# Patient Record
Sex: Male | Born: 1945
Health system: Southern US, Community
[De-identification: ages and names within clinical notes are randomized; demographics above are authoritative.]

## PROBLEM LIST (undated history)

## (undated) DIAGNOSIS — I10 Essential (primary) hypertension: Secondary | ICD-10-CM

## (undated) DIAGNOSIS — N183 Chronic kidney disease, stage 3 unspecified: Secondary | ICD-10-CM

## (undated) DIAGNOSIS — R7303 Prediabetes: Secondary | ICD-10-CM

## (undated) DIAGNOSIS — R519 Headache, unspecified: Secondary | ICD-10-CM

## (undated) DIAGNOSIS — G8929 Other chronic pain: Secondary | ICD-10-CM

## (undated) DIAGNOSIS — I714 Abdominal aortic aneurysm, without rupture: Secondary | ICD-10-CM

## (undated) DIAGNOSIS — M549 Dorsalgia, unspecified: Secondary | ICD-10-CM

## (undated) DIAGNOSIS — K219 Gastro-esophageal reflux disease without esophagitis: Secondary | ICD-10-CM

## (undated) DIAGNOSIS — M1990 Unspecified osteoarthritis, unspecified site: Secondary | ICD-10-CM

## (undated) DIAGNOSIS — K220 Achalasia of cardia: Secondary | ICD-10-CM

## (undated) DIAGNOSIS — R51 Headache: Secondary | ICD-10-CM

## (undated) DIAGNOSIS — N320 Bladder-neck obstruction: Secondary | ICD-10-CM

## (undated) DIAGNOSIS — E785 Hyperlipidemia, unspecified: Secondary | ICD-10-CM

## (undated) DIAGNOSIS — E119 Type 2 diabetes mellitus without complications: Secondary | ICD-10-CM

## (undated) DIAGNOSIS — Z8673 Personal history of transient ischemic attack (TIA), and cerebral infarction without residual deficits: Secondary | ICD-10-CM

## (undated) DIAGNOSIS — R1312 Dysphagia, oropharyngeal phase: Secondary | ICD-10-CM

## (undated) DIAGNOSIS — F32A Depression, unspecified: Secondary | ICD-10-CM

## (undated) DIAGNOSIS — I2510 Atherosclerotic heart disease of native coronary artery without angina pectoris: Secondary | ICD-10-CM

## (undated) DIAGNOSIS — K8500 Idiopathic acute pancreatitis without necrosis or infection: Secondary | ICD-10-CM

## (undated) DIAGNOSIS — I4892 Unspecified atrial flutter: Secondary | ICD-10-CM

## (undated) DIAGNOSIS — R413 Other amnesia: Secondary | ICD-10-CM

## (undated) DIAGNOSIS — F0150 Vascular dementia without behavioral disturbance: Secondary | ICD-10-CM

## (undated) DIAGNOSIS — I779 Disorder of arteries and arterioles, unspecified: Secondary | ICD-10-CM

## (undated) DIAGNOSIS — K5900 Constipation, unspecified: Secondary | ICD-10-CM

## (undated) DIAGNOSIS — R0600 Dyspnea, unspecified: Secondary | ICD-10-CM

## (undated) DIAGNOSIS — N529 Male erectile dysfunction, unspecified: Secondary | ICD-10-CM

## (undated) DIAGNOSIS — I4891 Unspecified atrial fibrillation: Secondary | ICD-10-CM

## (undated) DIAGNOSIS — N35919 Unspecified urethral stricture, male, unspecified site: Secondary | ICD-10-CM

## (undated) DIAGNOSIS — I739 Peripheral vascular disease, unspecified: Secondary | ICD-10-CM

## (undated) DIAGNOSIS — F419 Anxiety disorder, unspecified: Secondary | ICD-10-CM

## (undated) HISTORY — DX: Disorder of arteries and arterioles, unspecified: I77.9

## (undated) HISTORY — DX: Male erectile dysfunction, unspecified: N52.9

## (undated) HISTORY — DX: Achalasia of cardia: K22.0

## (undated) HISTORY — DX: Atherosclerotic heart disease of native coronary artery without angina pectoris: I25.10

## (undated) HISTORY — DX: Peripheral vascular disease, unspecified: I73.9

## (undated) HISTORY — DX: Dorsalgia, unspecified: M54.9

## (undated) HISTORY — DX: Dysphagia, oropharyngeal phase: R13.12

## (undated) HISTORY — DX: Constipation, unspecified: K59.00

## (undated) HISTORY — PX: CATARACT EXTRACTION W/ INTRAOCULAR LENS  IMPLANT, BILATERAL: SHX1307

## (undated) HISTORY — DX: Vascular dementia, unspecified severity, without behavioral disturbance, psychotic disturbance, mood disturbance, and anxiety: F01.50

## (undated) HISTORY — DX: Other chronic pain: G89.29

## (undated) HISTORY — DX: Essential (primary) hypertension: I10

## (undated) HISTORY — PX: OTHER SURGICAL HISTORY: SHX169

## (undated) HISTORY — DX: Hyperlipidemia, unspecified: E78.5

---
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
## 1998-08-14 ENCOUNTER — Other Ambulatory Visit: Admission: RE | Admit: 1998-08-14 | Discharge: 1998-08-14 | Payer: Self-pay | Admitting: *Deleted

## 2000-03-25 ENCOUNTER — Emergency Department (HOSPITAL_COMMUNITY): Admission: EM | Admit: 2000-03-25 | Discharge: 2000-03-25 | Payer: Self-pay | Admitting: Emergency Medicine

## 2001-12-12 ENCOUNTER — Encounter: Payer: Self-pay | Admitting: Emergency Medicine

## 2001-12-12 ENCOUNTER — Inpatient Hospital Stay (HOSPITAL_COMMUNITY): Admission: EM | Admit: 2001-12-12 | Discharge: 2001-12-13 | Payer: Self-pay | Admitting: Emergency Medicine

## 2002-02-03 ENCOUNTER — Encounter: Admission: RE | Admit: 2002-02-03 | Discharge: 2002-02-03 | Payer: Self-pay | Admitting: Family Medicine

## 2002-06-08 ENCOUNTER — Encounter: Admission: RE | Admit: 2002-06-08 | Discharge: 2002-06-08 | Payer: Self-pay | Admitting: Family Medicine

## 2002-07-06 ENCOUNTER — Ambulatory Visit (HOSPITAL_COMMUNITY): Admission: RE | Admit: 2002-07-06 | Discharge: 2002-07-06 | Payer: Self-pay | Admitting: Sports Medicine

## 2002-07-16 ENCOUNTER — Encounter: Payer: Self-pay | Admitting: Cardiology

## 2002-07-16 ENCOUNTER — Ambulatory Visit (HOSPITAL_COMMUNITY): Admission: RE | Admit: 2002-07-16 | Discharge: 2002-07-16 | Payer: Self-pay | Admitting: Sports Medicine

## 2002-08-30 ENCOUNTER — Encounter: Admission: RE | Admit: 2002-08-30 | Discharge: 2002-08-30 | Payer: Self-pay | Admitting: Family Medicine

## 2002-10-22 ENCOUNTER — Encounter: Admission: RE | Admit: 2002-10-22 | Discharge: 2002-10-22 | Payer: Self-pay | Admitting: Family Medicine

## 2002-12-07 ENCOUNTER — Ambulatory Visit (HOSPITAL_COMMUNITY): Admission: RE | Admit: 2002-12-07 | Discharge: 2002-12-07 | Payer: Self-pay | Admitting: Gastroenterology

## 2003-01-12 ENCOUNTER — Encounter: Admission: RE | Admit: 2003-01-12 | Discharge: 2003-01-12 | Payer: Self-pay | Admitting: Family Medicine

## 2003-06-13 ENCOUNTER — Encounter: Admission: RE | Admit: 2003-06-13 | Discharge: 2003-06-13 | Payer: Self-pay | Admitting: Family Medicine

## 2003-06-27 ENCOUNTER — Encounter: Admission: RE | Admit: 2003-06-27 | Discharge: 2003-06-27 | Payer: Self-pay | Admitting: Family Medicine

## 2003-08-03 ENCOUNTER — Encounter: Admission: RE | Admit: 2003-08-03 | Discharge: 2003-08-03 | Payer: Self-pay | Admitting: Sports Medicine

## 2004-10-31 ENCOUNTER — Ambulatory Visit (HOSPITAL_COMMUNITY): Admission: RE | Admit: 2004-10-31 | Discharge: 2004-10-31 | Payer: Self-pay | Admitting: Family Medicine

## 2004-10-31 ENCOUNTER — Inpatient Hospital Stay (HOSPITAL_COMMUNITY): Admission: AD | Admit: 2004-10-31 | Discharge: 2004-11-01 | Payer: Self-pay | Admitting: Family Medicine

## 2004-10-31 ENCOUNTER — Ambulatory Visit: Payer: Self-pay | Admitting: Family Medicine

## 2004-11-07 ENCOUNTER — Ambulatory Visit: Payer: Self-pay | Admitting: Family Medicine

## 2004-12-04 ENCOUNTER — Ambulatory Visit: Payer: Self-pay | Admitting: Family Medicine

## 2004-12-18 ENCOUNTER — Ambulatory Visit: Payer: Self-pay | Admitting: Family Medicine

## 2005-01-01 ENCOUNTER — Ambulatory Visit (HOSPITAL_COMMUNITY): Admission: RE | Admit: 2005-01-01 | Discharge: 2005-01-01 | Payer: Self-pay | Admitting: *Deleted

## 2005-01-01 ENCOUNTER — Ambulatory Visit: Payer: Self-pay | Admitting: Sports Medicine

## 2005-01-02 ENCOUNTER — Ambulatory Visit: Payer: Self-pay | Admitting: Family Medicine

## 2005-01-31 ENCOUNTER — Ambulatory Visit: Payer: Self-pay | Admitting: Family Medicine

## 2005-02-04 ENCOUNTER — Ambulatory Visit: Payer: Self-pay | Admitting: Family Medicine

## 2005-05-30 ENCOUNTER — Ambulatory Visit: Payer: Self-pay | Admitting: Family Medicine

## 2005-08-08 ENCOUNTER — Ambulatory Visit: Payer: Self-pay | Admitting: Family Medicine

## 2005-08-15 ENCOUNTER — Ambulatory Visit: Payer: Self-pay | Admitting: Family Medicine

## 2006-03-25 ENCOUNTER — Ambulatory Visit: Payer: Self-pay | Admitting: Family Medicine

## 2006-03-26 ENCOUNTER — Ambulatory Visit: Payer: Self-pay | Admitting: Family Medicine

## 2006-03-26 ENCOUNTER — Encounter (INDEPENDENT_AMBULATORY_CARE_PROVIDER_SITE_OTHER): Payer: Self-pay | Admitting: Family Medicine

## 2006-03-26 LAB — CONVERTED CEMR LAB
ALT: 47 units/L (ref 0–53)
AST: 39 units/L — ABNORMAL HIGH (ref 0–37)
Albumin: 4.1 g/dL (ref 3.5–5.2)
Alkaline Phosphatase: 81 units/L (ref 39–117)
BUN: 17 mg/dL (ref 6–23)
CO2: 21 meq/L (ref 19–32)
Calcium: 8.8 mg/dL (ref 8.4–10.5)
Chloride: 105 meq/L (ref 96–112)
Creatinine, Ser: 1.05 mg/dL (ref 0.40–1.50)
Direct LDL: 163 mg/dL — ABNORMAL HIGH
Glucose, Bld: 92 mg/dL (ref 70–99)
Potassium: 3.8 meq/L (ref 3.5–5.3)
Sodium: 138 meq/L (ref 135–145)
Total Bilirubin: 0.5 mg/dL (ref 0.3–1.2)
Total Protein: 6.8 g/dL (ref 6.0–8.3)

## 2006-03-27 DIAGNOSIS — I10 Essential (primary) hypertension: Secondary | ICD-10-CM

## 2006-03-27 DIAGNOSIS — N529 Male erectile dysfunction, unspecified: Secondary | ICD-10-CM

## 2006-03-27 DIAGNOSIS — I2510 Atherosclerotic heart disease of native coronary artery without angina pectoris: Secondary | ICD-10-CM

## 2006-05-07 ENCOUNTER — Ambulatory Visit: Payer: Self-pay | Admitting: Sports Medicine

## 2006-06-04 ENCOUNTER — Ambulatory Visit: Payer: Self-pay | Admitting: Family Medicine

## 2006-06-04 DIAGNOSIS — E663 Overweight: Secondary | ICD-10-CM | POA: Insufficient documentation

## 2006-06-04 LAB — CONVERTED CEMR LAB
Cholesterol, target level: 200 mg/dL
HDL goal, serum: 40 mg/dL
LDL Goal: 100 mg/dL

## 2007-02-05 ENCOUNTER — Ambulatory Visit: Payer: Self-pay | Admitting: Family Medicine

## 2007-02-05 DIAGNOSIS — E785 Hyperlipidemia, unspecified: Secondary | ICD-10-CM | POA: Insufficient documentation

## 2007-02-09 ENCOUNTER — Encounter: Payer: Self-pay | Admitting: Family Medicine

## 2007-02-09 ENCOUNTER — Ambulatory Visit: Payer: Self-pay | Admitting: Family Medicine

## 2007-02-09 LAB — CONVERTED CEMR LAB: Hemoglobin: 12.9 g/dL

## 2007-02-11 LAB — CONVERTED CEMR LAB
ALT: 22 units/L (ref 0–53)
AST: 25 units/L (ref 0–37)
Albumin: 4.1 g/dL (ref 3.5–5.2)
Alkaline Phosphatase: 86 units/L (ref 39–117)
BUN: 14 mg/dL (ref 6–23)
CO2: 24 meq/L (ref 19–32)
Calcium: 8.9 mg/dL (ref 8.4–10.5)
Chloride: 105 meq/L (ref 96–112)
Cholesterol: 230 mg/dL — ABNORMAL HIGH (ref 0–200)
Creatinine, Ser: 1.25 mg/dL (ref 0.40–1.50)
Glucose, Bld: 118 mg/dL — ABNORMAL HIGH (ref 70–99)
HDL: 53 mg/dL (ref >39)
LDL Cholesterol: 163 mg/dL — ABNORMAL HIGH (ref 0–99)
Potassium: 4.1 meq/L (ref 3.5–5.3)
Sodium: 139 meq/L (ref 135–145)
Total Bilirubin: 0.5 mg/dL (ref 0.3–1.2)
Total CHOL/HDL Ratio: 4.3
Total CK: 723 units/L — ABNORMAL HIGH (ref 7–232)
Total Protein: 6.9 g/dL (ref 6.0–8.3)
Triglycerides: 72 mg/dL (ref <150)
VLDL: 14 mg/dL (ref 0–40)

## 2007-06-18 ENCOUNTER — Ambulatory Visit: Payer: Self-pay | Admitting: Sports Medicine

## 2007-06-18 ENCOUNTER — Telehealth: Payer: Self-pay | Admitting: *Deleted

## 2007-06-18 LAB — CONVERTED CEMR LAB
Bilirubin Urine: NEGATIVE
Blood in Urine, dipstick: NEGATIVE
Glucose, Urine, Semiquant: NEGATIVE
Ketones, urine, test strip: NEGATIVE
Nitrite: NEGATIVE
Protein, U semiquant: 100
Specific Gravity, Urine: >=1.03
Urobilinogen, UA: 4
WBC Urine, dipstick: NEGATIVE
pH: 6

## 2007-06-19 ENCOUNTER — Ambulatory Visit: Payer: Self-pay | Admitting: Family Medicine

## 2007-06-19 ENCOUNTER — Encounter: Payer: Self-pay | Admitting: Family Medicine

## 2007-06-19 LAB — CONVERTED CEMR LAB
Bilirubin Urine: NEGATIVE
Blood in Urine, dipstick: NEGATIVE
Glucose, Urine, Semiquant: NEGATIVE
Ketones, urine, test strip: NEGATIVE
Nitrite: NEGATIVE
Protein, U semiquant: 30
Specific Gravity, Urine: >=1.03
Urobilinogen, UA: 1
WBC Urine, dipstick: NEGATIVE
pH: 5.5

## 2007-06-22 ENCOUNTER — Inpatient Hospital Stay (HOSPITAL_COMMUNITY): Admission: EM | Admit: 2007-06-22 | Discharge: 2007-06-24 | Payer: Self-pay | Admitting: Emergency Medicine

## 2007-06-22 ENCOUNTER — Ambulatory Visit: Payer: Self-pay | Admitting: Family Medicine

## 2007-06-23 ENCOUNTER — Encounter: Payer: Self-pay | Admitting: Family Medicine

## 2007-06-23 LAB — CONVERTED CEMR LAB
HDL: 34 mg/dL
HDL: 34 mg/dL
LDL Cholesterol: 184 mg/dL

## 2007-06-24 ENCOUNTER — Ambulatory Visit: Payer: Self-pay | Admitting: *Deleted

## 2007-06-24 ENCOUNTER — Encounter: Payer: Self-pay | Admitting: Family Medicine

## 2007-06-25 ENCOUNTER — Encounter: Payer: Self-pay | Admitting: *Deleted

## 2007-06-27 ENCOUNTER — Emergency Department (HOSPITAL_COMMUNITY): Admission: EM | Admit: 2007-06-27 | Discharge: 2007-06-27 | Payer: Self-pay | Admitting: Emergency Medicine

## 2007-07-01 ENCOUNTER — Encounter: Payer: Self-pay | Admitting: Family Medicine

## 2007-07-01 ENCOUNTER — Ambulatory Visit: Payer: Self-pay | Admitting: Family Medicine

## 2007-07-03 LAB — CONVERTED CEMR LAB
BUN: 37 mg/dL — ABNORMAL HIGH (ref 6–23)
CO2: 21 meq/L (ref 19–32)
Calcium: 9.7 mg/dL (ref 8.4–10.5)
Chloride: 100 meq/L (ref 96–112)
Creatinine, Ser: 2.17 mg/dL — ABNORMAL HIGH (ref 0.40–1.50)
Glucose, Bld: 93 mg/dL (ref 70–99)
Potassium: 4.1 meq/L (ref 3.5–5.3)
Sodium: 134 meq/L — ABNORMAL LOW (ref 135–145)

## 2007-07-07 ENCOUNTER — Ambulatory Visit: Payer: Self-pay | Admitting: Family Medicine

## 2007-07-07 ENCOUNTER — Encounter: Payer: Self-pay | Admitting: Family Medicine

## 2007-07-07 ENCOUNTER — Encounter: Payer: Self-pay | Admitting: *Deleted

## 2007-07-07 LAB — CONVERTED CEMR LAB

## 2007-07-10 LAB — CONVERTED CEMR LAB
BUN: 31 mg/dL — ABNORMAL HIGH (ref 6–23)
CO2: 19 meq/L (ref 19–32)
Calcium: 9.1 mg/dL (ref 8.4–10.5)
Chloride: 102 meq/L (ref 96–112)
Creatinine, Ser: 1.44 mg/dL (ref 0.40–1.50)
Glucose, Bld: 120 mg/dL — ABNORMAL HIGH (ref 70–99)
Potassium: 4.5 meq/L (ref 3.5–5.3)
Sodium: 134 meq/L — ABNORMAL LOW (ref 135–145)

## 2007-07-17 ENCOUNTER — Ambulatory Visit: Payer: Self-pay | Admitting: Family Medicine

## 2007-11-23 ENCOUNTER — Encounter (INDEPENDENT_AMBULATORY_CARE_PROVIDER_SITE_OTHER): Payer: Self-pay | Admitting: *Deleted

## 2007-11-24 ENCOUNTER — Telehealth: Payer: Self-pay | Admitting: *Deleted

## 2007-11-27 ENCOUNTER — Encounter: Payer: Self-pay | Admitting: Family Medicine

## 2007-11-27 ENCOUNTER — Ambulatory Visit: Payer: Self-pay | Admitting: Family Medicine

## 2007-11-27 DIAGNOSIS — M545 Low back pain: Secondary | ICD-10-CM

## 2007-11-28 LAB — CONVERTED CEMR LAB: Direct LDL: 147 mg/dL — ABNORMAL HIGH

## 2008-02-09 ENCOUNTER — Telehealth: Payer: Self-pay | Admitting: Family Medicine

## 2008-02-11 ENCOUNTER — Encounter: Payer: Self-pay | Admitting: Family Medicine

## 2008-02-12 ENCOUNTER — Ambulatory Visit: Payer: Self-pay | Admitting: Family Medicine

## 2008-02-12 ENCOUNTER — Encounter: Payer: Self-pay | Admitting: Family Medicine

## 2008-02-29 ENCOUNTER — Encounter: Payer: Self-pay | Admitting: Family Medicine

## 2008-02-29 LAB — CONVERTED CEMR LAB
ALT: 37 units/L (ref 0–53)
AST: 40 units/L — ABNORMAL HIGH (ref 0–37)
Albumin: 4.1 g/dL (ref 3.5–5.2)
Alkaline Phosphatase: 84 units/L (ref 39–117)
BUN: 19 mg/dL (ref 6–23)
CO2: 28 meq/L (ref 19–32)
Calcium: 8.8 mg/dL (ref 8.4–10.5)
Chloride: 102 meq/L (ref 96–112)
Cholesterol: 147 mg/dL (ref 0–200)
Creatinine, Ser: 1.24 mg/dL (ref 0.40–1.50)
Glucose, Bld: 120 mg/dL — ABNORMAL HIGH (ref 70–99)
HDL: 47 mg/dL (ref >39)
LDL Cholesterol: 87 mg/dL (ref 0–99)
Potassium: 3.8 meq/L (ref 3.5–5.3)
Sodium: 139 meq/L (ref 135–145)
Total Bilirubin: 0.5 mg/dL (ref 0.3–1.2)
Total CHOL/HDL Ratio: 3.1
Total Protein: 6.7 g/dL (ref 6.0–8.3)
Triglycerides: 66 mg/dL (ref <150)
VLDL: 13 mg/dL (ref 0–40)

## 2008-03-21 ENCOUNTER — Telehealth: Payer: Self-pay | Admitting: *Deleted

## 2008-03-22 ENCOUNTER — Encounter: Payer: Self-pay | Admitting: Family Medicine

## 2008-03-22 ENCOUNTER — Ambulatory Visit: Payer: Self-pay | Admitting: Family Medicine

## 2008-03-25 LAB — CONVERTED CEMR LAB: PSA: 0.63 ng/mL (ref 0.10–4.00)

## 2008-04-29 ENCOUNTER — Telehealth: Payer: Self-pay | Admitting: Family Medicine

## 2008-05-03 ENCOUNTER — Ambulatory Visit: Payer: Self-pay | Admitting: Family Medicine

## 2008-05-16 ENCOUNTER — Telehealth: Payer: Self-pay | Admitting: *Deleted

## 2008-06-08 ENCOUNTER — Encounter: Admission: RE | Admit: 2008-06-08 | Discharge: 2008-06-08 | Payer: Self-pay | Admitting: Family Medicine

## 2008-06-08 ENCOUNTER — Ambulatory Visit: Payer: Self-pay | Admitting: Family Medicine

## 2008-06-22 ENCOUNTER — Encounter: Admission: RE | Admit: 2008-06-22 | Discharge: 2008-07-21 | Payer: Self-pay | Admitting: Family Medicine

## 2009-01-18 ENCOUNTER — Encounter: Payer: Self-pay | Admitting: *Deleted

## 2009-02-07 ENCOUNTER — Encounter: Payer: Self-pay | Admitting: Family Medicine

## 2009-02-07 ENCOUNTER — Ambulatory Visit: Payer: Self-pay | Admitting: Family Medicine

## 2009-02-08 LAB — CONVERTED CEMR LAB
ALT: 28 units/L (ref 0–53)
AST: 34 units/L (ref 0–37)
Albumin: 4.2 g/dL (ref 3.5–5.2)
Alkaline Phosphatase: 88 units/L (ref 39–117)
BUN: 17 mg/dL (ref 6–23)
CO2: 25 meq/L (ref 19–32)
Calcium: 8.8 mg/dL (ref 8.4–10.5)
Chloride: 104 meq/L (ref 96–112)
Cholesterol: 201 mg/dL — ABNORMAL HIGH (ref 0–200)
Creatinine, Ser: 1.14 mg/dL (ref 0.40–1.50)
Glucose, Bld: 87 mg/dL (ref 70–99)
HDL: 43 mg/dL (ref >39)
LDL Cholesterol: 145 mg/dL — ABNORMAL HIGH (ref 0–99)
Potassium: 4.4 meq/L (ref 3.5–5.3)
Sodium: 138 meq/L (ref 135–145)
Total Bilirubin: 0.5 mg/dL (ref 0.3–1.2)
Total CHOL/HDL Ratio: 4.7
Total Protein: 6.5 g/dL (ref 6.0–8.3)
Triglycerides: 66 mg/dL (ref <150)
VLDL: 13 mg/dL (ref 0–40)

## 2009-02-13 ENCOUNTER — Telehealth: Payer: Self-pay | Admitting: Family Medicine

## 2009-02-28 ENCOUNTER — Ambulatory Visit: Payer: Self-pay | Admitting: Family Medicine

## 2009-04-13 ENCOUNTER — Telehealth: Payer: Self-pay | Admitting: Family Medicine

## 2009-05-01 ENCOUNTER — Ambulatory Visit: Payer: Self-pay | Admitting: Family Medicine

## 2009-05-01 ENCOUNTER — Emergency Department (HOSPITAL_COMMUNITY): Admission: EM | Admit: 2009-05-01 | Discharge: 2009-05-01 | Payer: Self-pay | Admitting: Emergency Medicine

## 2009-05-01 DIAGNOSIS — R0789 Other chest pain: Secondary | ICD-10-CM | POA: Insufficient documentation

## 2009-05-10 ENCOUNTER — Ambulatory Visit: Payer: Self-pay | Admitting: Family Medicine

## 2009-05-10 ENCOUNTER — Encounter (INDEPENDENT_AMBULATORY_CARE_PROVIDER_SITE_OTHER): Payer: Self-pay | Admitting: *Deleted

## 2009-05-16 ENCOUNTER — Telehealth: Payer: Self-pay | Admitting: Family Medicine

## 2009-05-16 ENCOUNTER — Encounter: Payer: Self-pay | Admitting: Family Medicine

## 2009-06-15 ENCOUNTER — Telehealth: Payer: Self-pay | Admitting: Family Medicine

## 2009-07-11 ENCOUNTER — Ambulatory Visit: Payer: Self-pay | Admitting: Family Medicine

## 2009-07-11 ENCOUNTER — Encounter: Payer: Self-pay | Admitting: Family Medicine

## 2009-07-12 LAB — CONVERTED CEMR LAB: Direct LDL: 138 mg/dL — ABNORMAL HIGH

## 2009-07-13 ENCOUNTER — Encounter: Payer: Self-pay | Admitting: Family Medicine

## 2009-07-20 ENCOUNTER — Ambulatory Visit: Payer: Self-pay | Admitting: Internal Medicine

## 2009-07-20 DIAGNOSIS — I7389 Other specified peripheral vascular diseases: Secondary | ICD-10-CM

## 2009-07-20 DIAGNOSIS — I6529 Occlusion and stenosis of unspecified carotid artery: Secondary | ICD-10-CM | POA: Insufficient documentation

## 2009-07-25 ENCOUNTER — Telehealth: Payer: Self-pay | Admitting: *Deleted

## 2009-08-08 ENCOUNTER — Telehealth (INDEPENDENT_AMBULATORY_CARE_PROVIDER_SITE_OTHER): Payer: Self-pay | Admitting: *Deleted

## 2009-08-09 ENCOUNTER — Ambulatory Visit: Payer: Self-pay

## 2009-08-09 ENCOUNTER — Encounter: Payer: Self-pay | Admitting: Cardiology

## 2009-08-09 ENCOUNTER — Encounter: Payer: Self-pay | Admitting: Internal Medicine

## 2009-08-09 ENCOUNTER — Ambulatory Visit: Payer: Self-pay | Admitting: Cardiology

## 2009-08-09 ENCOUNTER — Encounter (HOSPITAL_COMMUNITY)
Admission: RE | Admit: 2009-08-09 | Discharge: 2009-10-04 | Payer: Self-pay | Source: Home / Self Care | Admitting: Internal Medicine

## 2009-08-09 ENCOUNTER — Encounter: Payer: Self-pay | Admitting: Cardiovascular Disease

## 2009-08-09 HISTORY — PX: CARDIOVASCULAR STRESS TEST: SHX262

## 2009-11-23 ENCOUNTER — Encounter: Payer: Self-pay | Admitting: Family Medicine

## 2010-02-27 NOTE — Progress Notes (Signed)
Summary: Nuclear Pre-Procedure  Phone Note Outgoing Call Call back at [facility] Phone [phone]   Call placed by: Stanton Kidney, EMT-P,  August 08, 2009 2:13 PM Call placed to: Patient Action Taken: Phone Call Completed Summary of Call: Reviewed information on Myoview Information Sheet (see scanned document for further details).  Spoke with Patient.    Nuclear Med Background Indications for Stress Test: Evaluation for Ischemia   History: Echo, GXT  History Comments: '04 GXT: (-) ischemia '09 Echo= 55-65%  Symptoms: Chest Pain    Nuclear Pre-Procedure Cardiac Risk Factors: Carotid Disease, Claudication, CVA, Family History - CAD, Hypertension, Lipids Height (in): 74

## 2010-02-27 NOTE — Assessment & Plan Note (Signed)
Summary: np6/atypical chest pain/multi risk factors  Medications Added TYLENOL EX ST ARTHRITIS PAIN 500 MG  TABS (ACETAMINOPHEN) take two tablets by mouth every 8 hours as needed for pain(OUT) ANACIN 81 MG  TBEC (ASPIRIN) 1 tab two times a day VIAGRA 50 MG TABS (SILDENAFIL CITRATE) 1 tablet by mouth daily as needed for sexual dysfunction(OUT)      Allergies Added: NKDA  Visit Type:  Initial Consult Primary Provider:  Marisue Ivan  MD  CC:  evaluation a typical chest pain w/multiple risk factors.  History of Present Illness: Patient is a 65 year old with no known CAD.  DOes have a hsitory of CV disease.   Patient referred for evaluation of chest pains.  Pains are short lived.  He says they come on when he eats or drinks too fast.  Relieved with burp. Denies PND.  Not that active.  No chest pain with activity. Does note cramping in legs with walking.  Current Medications (verified): 1)  Hydrochlorothiazide 25 Mg  Tabs (Hydrochlorothiazide) .... Take 1 Tab By Mouth Every Morning 2)  Simvastatin 40 Mg Tabs (Simvastatin) .Marland Kitchen.. 1 1/2 Tabs By Mouth At Bedtime 3)  Tylenol Ex St Arthritis Pain 500 Mg  Tabs (Acetaminophen) .... Take Two Tablets By Mouth Every 8 Hours As Needed For Pain(Out) 4)  Anacin 81 Mg  Tbec (Aspirin) .Marland Kitchen.. 1 Tab Two Times A Day 5)  Viagra 50 Mg Tabs (Sildenafil Citrate) .Marland Kitchen.. 1 Tablet By Mouth Daily As Needed For Sexual Dysfunction(Out)  Allergies (verified): No Known Drug Allergies  Past History:  Past Medical History: Last updated: 12-26-2007 HTN HLD Erectile dysfunction Chronic back pain Hospitalized 5/09 with new R sided CVA (infarct) Echo-  EF 55-65%, otherwise nl - 07/21/2002 ETT: no ischemic changes, submax HR - 07/12/2002  Past Surgical History: Last updated: 12-26-07 None  Family History: Last updated: 2007-12-26 Brother-died at 58yo heart, kidney, lung dz  F-died accident in 18s M-died age 47; heart problems, kidney problems; lung  dz  Social History: Last updated: 06/08/2008 married x 9 years; only 2 step children;  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  Vital Signs:  Patient profile:   65 year old male Height:      74 inches Weight:      221 pounds BMI:     28.48 Pulse rate:   69 / minute BP sitting:   132 / 91  (left arm) Cuff size:   regular  Vitals Entered By: Burnett Kanaris, CNA (July 20, 2009 11:43 AM)  Physical Exam  Additional Exam:  Pateint is in NAD HEENT:  Normocephalic, atraumatic. EOMI, PERRLA.  Neck: JVP is normal. No thyromegaly. No bruits.  Lungs: Decreased airflow.  No wheezes.. No rales no wheezes.  Heart: Regular rate and rhythm. Normal S1, S2. No S3.   No significant murmurs. PMI not displaced.  Abdomen:  Supple, nontender. Normal bowel sounds. No masses. No hepatomegaly.  Extremities:   Trace LE pulses.t. No lower extremity edema.  Musculoskeletal :moving all extremities.  Neuro:   alert and oriented x3.    EKG  Procedure date:  07/20/2009  Findings:      NSR>  60 bpm. LVH. Nonspecific ST T  wave changes.  Impression & Recommendations:  Problem # 1:  CHEST PAIN, ATYPICAL (ICD-786.59) Pain is atypical.  But patient is not that active.  Prob limited by claudication.  Has CV dz by carotd USN 2 years ago I would recommend a Lexiscan myoview to evaluate.  Problem # 2:  PVD WITH CLAUDICATION (ICD-443.89) Sched LE doppler. Schedule carotid dopplers as well.  Problem # 3:  HYPERLIPIDEMIA (ICD-272.4) Continue statin.  will need to be followed.  Other Orders: EKG w/ Interpretation (93000) Nuclear Stress Test (Nuc Stress Test) Arterial Duplex Lower Extremity (Arterial Duplex Low) Carotid Duplex (Carotid  Duplex)  Patient Instructions: 1)  Your physician has requested that you have a carotid duplex. This test is an ultrasound of the carotid arteries in your neck. It looks at blood flow through these arteries that supply the brain with blood. Allow one hour for this exam. There are no restrictions or special instructions. 2)  Your physician has requested that you have a lower or upper extremity arterial duplex.  This test is an ultrasound of the arteries in the legs or arms.  It looks at arterial blood flow in the legs and arms.  Allow one hour for Lower and Upper Arterial scans. There are no restrictions or special instructions. 3)  Your physician has requested that you have a  lexiscan myoview.  For further information please visit [email or web address]  Please follow instruction sheet, as given.

## 2010-02-27 NOTE — Assessment & Plan Note (Signed)
Summary: f/u chronic back pain, HTN, HLD   Vital Signs:  Patient profile:   65 year old male Weight:      230.1 pounds Temp:     98.2 degrees F oral Pulse rate:   56 / minute BP sitting:   150 / 87 Cuff size:   large  Vitals Entered By: Loralee Pacas CMA (February 07, 2009 11:51 AM)  Serial Vital Signs/Assessments:  Time      Position  BP       Pulse  Resp  Temp     By                     154/98                         Marisue Ivan  MD  Comments: L arm - sitting, manual By: Marisue Ivan  MD   CC: f/u HTN, HLD, back pain Comments sharp pain in lower back x 3 days   Primary Care Provider:  Marisue Ivan  MD  CC:  f/u HTN, HLD, and back pain.  History of Present Illness: 65yo M here for f/u HTN, HLD, back pain  HTN: No adverse effects from HCTZ.  Has been out for over a week.  Not checking it regularly.  Was well controlled at last visit.  No dizziness, HA, CP, palpitations, or swelling.  HLD: Tolerating Pravastatin.  No adverse effects.  No muscle pain or abd pain.  Back pain: Still present.  Not improved after physical therapy.  Pain controlled enough with scheduled tylenol to perform work duties.  No radiculopathy.  Preventative: Declines flu vaccination.  Up to date on colonoscopy.  Declined prostate exam.      Current Medications (verified): 1)  Hydrochlorothiazide 25 Mg  Tabs (Hydrochlorothiazide) .... Take 1 Tab By Mouth Every Morning 2)  Pravastatin Sodium 40 Mg Tabs (Pravastatin Sodium) .... 2 Tablets By Mouth Daily 3)  Tylenol Ex St Arthritis Pain 500 Mg  Tabs (Acetaminophen) .... Take Two Tablets By Mouth Every 8 Hours As Needed For Pain 4)  Anacin 81 Mg  Tbec (Aspirin) .... One Tablet By Mouth Daily  Allergies (verified): No Known Drug Allergies  Past History:  Past medical, surgical, family and social histories (including risk factors) reviewed, and no changes noted (except as noted below).  Past Medical History: Reviewed history from  11/27/2007 and no changes required. HTN HLD Erectile dysfunction Chronic back pain Hospitalized 5/09 with new R sided CVA (infarct) Echo-  EF 55-65%, otherwise nl - 07/21/2002 ETT: no ischemic changes, submax HR - 07/12/2002  Past Surgical History: Reviewed history from 11/27/2007 and no changes required. None  Family History: Reviewed history from 11/27/2007 and no changes required. Brother-died at 58yo heart, kidney, lung dz  F-died accident in 85s M-died age 40; heart problems, kidney problems; lung dz  Social History: Reviewed history from 06/08/2008 and no changes required. 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 No muscle pain or abd pain.  Physical Exam  General:  VS reviewed and rechecked.  Well appearing, NAD Eyes:  EOMI PERRLA Mouth:  no  teeth not wearing dentures Neck:  supple full ROM Lungs:  Normal respiratory effort, chest expands symmetrically. Lungs are clear to auscultation, no crackles or wheezes. Heart:  Normal rate and regular rhythm. S1 and S2 normal without gallop, murmur, click, rub or other extra sounds. Abdomen:  soft, NT, ND, no HSM Prostate:  declined Msk:  full ROM of all joints Extremities:  no edema Neurologic:  alert & oriented X3, cranial nerves II-XII intact, strength normal in all extremities, and gait normal.   no focal deficits   Impression & Recommendations:  Problem # 1:  HYPERTENSION, BENIGN SYSTEMIC (ICD-401.1) Assessment Deteriorated  Not at goal (<140/90).  Likely b/c he hasn't been taking his medications.  No changes to meds today.  Plan to keep him on $4 plan.  Will check CMET to evaluate renal function and  electrolytes.   His updated medication list for this problem includes:    Hydrochlorothiazide 25 Mg Tabs (Hydrochlorothiazide) .Marland Kitchen... Take 1 tab by mouth every morning  Orders: [facility]- Est  Level 4 (11914)  Problem # 2:  HYPERLIPIDEMIA (ICD-272.4) Assessment: Unchanged Will check lipid panel today.  Has been in good control in the past.  No changes to meds at this time.  Will check CMET to evaluate LFTs.   His updated medication list for this problem includes:    Pravastatin Sodium 40 Mg Tabs (Pravastatin sodium) .Marland Kitchen... 2 tablets by mouth daily  Orders: T-Lipid Profile (78295-62130) [facility]- Est  Level 4 (86578)  Problem # 3:  BACK PAIN, LUMBAR, CHRONIC (ICD-724.2) Assessment: Unchanged  Still able to function but has consistent pain.  Previous xray conveyed no fx or degenerative joint disease.  Plan to continue the schedule tylenol.  The following medications were removed from the medication list:    Ultram 50 Mg Tabs (Tramadol hcl) ..... One tablet by mouth q6 as needed pain His updated medication list for this problem includes:    Tylenol Ex St Arthritis Pain 500 Mg Tabs (Acetaminophen) .Marland Kitchen... Take two tablets by mouth every 8 hours as needed for pain    Anacin 81 Mg Tbec (Aspirin) ..... One tablet by mouth daily  Orders: [facility]- Est  Level 4 (46962)  Problem # 4:  Preventive Health Care (ICD-V70.0) Assessment: Comment Only Declined flu and prostate exam.  Up to date on colonoscopy.  Will have him see Rudell Cobb to discuss financial assistance options.  Complete Medication List: 1)  Hydrochlorothiazide 25 Mg Tabs (Hydrochlorothiazide) .... Take 1 tab by mouth every morning 2)  Pravastatin Sodium 40 Mg Tabs (Pravastatin sodium) .... 2 tablets by mouth daily 3)  Tylenol Ex St Arthritis Pain 500 Mg Tabs (Acetaminophen) .... Take two tablets by mouth every 8 hours as needed for pain 4)  Anacin 81 Mg Tbec (Aspirin) .... One tablet by mouth daily  Other Orders: T-Comprehensive  Metabolic Panel ([id number])  Patient Instructions: 1)  I will call you with the lab results and we will talk about follow up at that time. 2)  I want you to pick your medicines as soon as possible. 3)  Go see Rudell Cobb about financial assistance.   Prescriptions: PRAVASTATIN SODIUM 40 MG TABS (PRAVASTATIN SODIUM) 2 tablets by mouth daily  #90 x 1   Entered and Authorized by:   Marisue Ivan  MD   Signed by:   Marisue Ivan  MD on 02/07/2009   Method used:   Electronically to        [facility] [phone]* (retail)  [address]       [place], [place]  [address]       Ph: [phone]       Fax: [phone]   RxID:   [id number]    Prevention & Chronic Care Immunizations   Influenza vaccine: refused  (11/27/2007)   Influenza vaccine deferral: Refused  (02/07/2009)   Influenza vaccine due: Refused  (11/27/2007)    Tetanus booster: 05/29/2002: Done.   Tetanus booster due: 05/28/2012    Pneumococcal vaccine: Not documented    H. zoster vaccine: Not documented   H. zoster vaccine deferral: Refused  (02/07/2009)  Colorectal Screening   Hemoccult: refused  (11/27/2007)   Hemoccult due: Not Indicated    Colonoscopy: Done.  (10/29/2002)   Colonoscopy due: 10/28/2012  Other Screening   PSA: 0.63  (03/22/2008)   Smoking status: quit  (06/08/2008)  Lipids   Total Cholesterol: 147  (02/12/2008)   Lipid panel action/deferral: Lipid Panel ordered   LDL: 87  (02/12/2008)   LDL Direct: 147  (11/27/2007)   HDL: 47  (02/12/2008)   Triglycerides: 66  (02/12/2008)    SGOT (AST): 40  (02/12/2008)   BMP action: Ordered   SGPT (ALT): 37  (02/12/2008) CMP ordered    Alkaline phosphatase: 84  (02/12/2008)   Total bilirubin: 0.5  (02/12/2008)    Lipid flowsheet reviewed?: Yes   Progress toward LDL goal: At goal  Hypertension   Last Blood Pressure: 150 / 87  (02/07/2009)   Serum creatinine: 1.24  (02/12/2008)   BMP action: Ordered   Serum potassium  3.8  (02/12/2008) CMP ordered     Hypertension flowsheet reviewed?: Yes   Progress toward BP goal: Deteriorated  Self-Management Support :   Personal Goals (by the next clinic visit) :      Personal blood pressure goal: 140/90  (02/07/2009)     Personal LDL goal: 130  (02/07/2009)    Patient will work on the following items until the next clinic visit to reach self-care goals:     Medications and monitoring: take my medicines every day, check my blood pressure, bring all of my medications to every visit  (02/07/2009)     Eating: drink diet soda or water instead of juice or soda, eat more vegetables, use fresh or frozen vegetables, eat foods that are low in salt, eat baked foods instead of fried foods, eat fruit for snacks and desserts, limit or avoid alcohol  (02/07/2009)    Hypertension self-management support: BP self-monitoring log, Written self-care plan, Education handout  (02/07/2009)   Hypertension self-care plan printed.   Hypertension education handout printed    Lipid self-management support: Written self-care plan, Education handout  (02/07/2009)   Lipid self-care plan printed.   Lipid education handout printed

## 2010-02-27 NOTE — Progress Notes (Signed)
Summary: Rx Ques   Phone Note Call from Patient Call back at [facility] Phone [phone]   Caller: Patient Summary of Call: Asking about some rx's that he is to take.  One is for his cholesterol and the other he is not sure about. Initial call taken by: Clydell Hakim,  February 13, 2009 9:07 AM  Follow-up for Phone Call        spoke with patient and he  is confused about his cholesterol medication . he currently has pravastatin and simvastatin on hand. he has been taking the simvastatin for 4 nights now. advised patient that from note in chart Dr Burnadette Pop changed his pravastatin to simvastatin.  advised  to put pravastatin aside and not to take. Follow-up by: Theresia Lo RN,  February 13, 2009 9:21 AM  Additional Follow-up for Phone Call Additional follow up Details #1::        Larita Fife is correct in what she advised the patient. Additional Follow-up by: Marisue Ivan  MD,  February 14, 2009 3:09 PM

## 2010-02-27 NOTE — Progress Notes (Signed)
Summary: Rx Req   Phone Note Refill Request Call back at Home Phone [phone] Message from:  Patient  Refills Requested: Medication #1:  SIMVASTATIN 40 MG TABS 1 tablet by mouth at bedtime for high cholesterol PT USES [facility] [address].  Initial call taken by: Clydell Hakim,  April 13, 2009 9:44 AM  Follow-up for Phone Call        request completed Follow-up by: Marisue Ivan  MD,  April 14, 2009 12:35 PM

## 2010-02-27 NOTE — Progress Notes (Signed)
   Phone Note Outgoing Call   Call placed by: Marisue Ivan  MD,  May 16, 2009 11:58 AM Summary of Call: Attempted to call Mr. Tamargo to discuss obtaining a myoview since he was unable to complete an ETT in the past despite informing me that he was capable.  He did not answer his phone.  Will send him a letter to notify him of the situation. Initial call taken by: Marisue Ivan  MD,  May 16, 2009 11:59 AM

## 2010-02-27 NOTE — Letter (Signed)
Summary: Generic Letter  [facility]  [address]   [place], [place] [address]   Phone: [phone]  Fax: [phone]    05/16/2009  Brian Burns [address] [place], [place]  [address]  Dear Mr. ASHKAR,  Looking back at previous records, it is noted that you were not able to complete an exercise treadmill test to evaluate your heart.  I would like to discuss with you in more detail about an alternative myoview to better evaluate your heart.  I tried calling you but no one answered.  Please call us back with a good time to reach you or better yet, return to the office to discuss the options in person.   Sincerely,   Marisue Ivan  MD  Appended Document: Generic Letter mailed

## 2010-02-27 NOTE — Letter (Signed)
Summary: Out of Work  [facility]  [address]   [place], [place] [address]   Phone: [phone]  Fax: [phone]    May 10, 2009   Employee:  Brian Burns    To Whom It May Concern:   For Medical reasons, please excuse the above named employee from work for the following dates:  Start:   May 10, 2009  End:  May 10, 2009  If you need additional information, please feel free to contact our office.         Sincerely,    Gladstone Pih

## 2010-02-27 NOTE — Assessment & Plan Note (Signed)
Summary: Atypical chest pain- sent to ER for evaluation   Vital Signs:  Patient profile:   65 year old male Height:      74.75 inches Weight:      227 pounds Pulse rate:   61 / minute BP sitting:   166 / 104  (right arm)  Vitals Entered By: Arlyss Repress CMA, (May 01, 2009 12:07 PM) CC: chest pain Pain Assessment Patient in pain? yes     Location: mid chest Onset of pain  today   Primary Care Provider:  Marisue Ivan  MD  CC:  chest pain.  History of Present Illness: 65yo M w/ acute chest pain  Acute chest pain: Central chest pain.  Described as achy lasting a few seconds not associated with exertion.  Spontaenously resolved after drinking water.  Nonradiating.  No N/V, diaphoresis, or SOB.  Has hx of HLD, HTN, and CVA.  No discomfort at this time.  Allergies: No Known Drug Allergies  Past History:  Past Medical History: Reviewed history from 11/27/2007 and no changes required. HTN HLD Erectile dysfunction Chronic back pain Hospitalized 5/09 with new R sided CVA (infarct) Echo-  EF 55-65%, otherwise nl - 07/21/2002 ETT: no ischemic changes, submax HR - 07/12/2002  Past Surgical History: Reviewed history from 11/27/2007 and no changes required. None  Family History: Reviewed history from 11/27/2007 and no changes required. Brother-died at 58yo heart, kidney, lung dz  F-died accident in 59s M-died age 45; heart problems, kidney problems; lung dz  Social History: Reviewed history from 06/08/2008 and no changes required. 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  Physical Exam  General:  VS Reviewed. Well appearing, NAD.  Neck:  supple, full ROM, no goiter or mass  Lungs:   Normal respiratory effort, chest expands symmetrically. Lungs are clear to auscultation, no crackles or wheezes. Heart:  Normal rate and regular rhythm. S1 and S2 normal without gallop, murmur, click, rub or other extra sounds. Neurologic:  no focal deficits Skin:  nl color and turgor Additional Exam:  12 lead EKG: NSR, rate 50, abnl t wave inversion on lead V3.  No ST changes.  No previous EKG to compare   Impression & Recommendations:  Problem # 1:  CHEST PAIN, ATYPICAL (ICD-786.59) Assessment New  Asymptomatic at this time.  He has high risk for cardiac etiology given hx of CAD, CVA, HTN, and HLD.   Last stress test was 06/2002 which was neg for ischemic changes. Given his risk factors and age, plan to have him evaluated in the ER. My suspicion is that this is most likely GI related but cannot be certain at this time.  Orders: [facility]- Est  Level 4 (45409)  Complete Medication List: 1)  Hydrochlorothiazide 25 Mg Tabs (Hydrochlorothiazide) .... Take 1 tab by mouth every morning 2)  Simvastatin 40 Mg Tabs (Simvastatin) .Marland Kitchen.. 1 tablet by mouth at bedtime for high cholesterol 3)  Tylenol Ex St Arthritis Pain 500 Mg Tabs (Acetaminophen) .... Take two tablets by mouth every 8 hours as needed for pain 4)  Anacin 81 Mg Tbec (Aspirin) .... One tablet by mouth daily  Patient Instructions: 1)  Go to  the Emergency room for evaluation of your recent chest pain. Show them this paper with your list of medications.

## 2010-02-27 NOTE — Assessment & Plan Note (Signed)
Summary: f/u HLD and atypical CP   Vital Signs:  Patient profile:   65 year old male Height:      74.75 inches Weight:      227.6 pounds BMI:     28.74 Temp:     98.2 degrees F oral Pulse rate:   51 / minute BP sitting:   122 / 72  (left arm) Cuff size:   regular  Vitals Entered By: Gladstone Pih (July 11, 2009 3:11 PM) CC: Discuss note for work Is Patient Diabetic? No Pain Assessment Patient in pain? no        Primary Care Provider:  Marisue Ivan  MD  CC:  Discuss note for work.  History of Present Illness: 65yo M here to f/u on atypical CP and HLD  Atypical CP: Last episode was 3-4 weeks ago and lasted for several seconds.  States that there are no aggravating factors, non-exertional and resolved with belching.  No more symptoms like he had a few months ago.  No associated N/V or diaphoresis.  No radiating pain.  No SOB.    HLD: Tolerating medication.  No adverse effects.  No muscle pain or abd pain.  Last LDL 01/2009 (145)   Habits & Providers  Alcohol-Tobacco-Diet     Tobacco Status: never  Current Medications (verified): 1)  Hydrochlorothiazide 25 Mg  Tabs (Hydrochlorothiazide) .... Take 1 Tab By Mouth Every Morning 2)  Simvastatin 40 Mg Tabs (Simvastatin) .Marland Kitchen.. 1 Tablet By Mouth At Bedtime For High Cholesterol 3)  Tylenol Ex St Arthritis Pain 500 Mg  Tabs (Acetaminophen) .... Take Two Tablets By Mouth Every 8 Hours As Needed For Pain 4)  Anacin 81 Mg  Tbec (Aspirin) .... One Tablet By Mouth Daily 5)  Viagra 50 Mg Tabs (Sildenafil Citrate) .Marland Kitchen.. 1 Tablet By Mouth Daily As Needed For Sexual Dysfunction  Allergies (verified): No Known Drug Allergies  Review of Systems      See HPI  Physical Exam  General:  VS Reviewed. Well appearing, NAD.  Lungs:  Normal respiratory effort, chest expands symmetrically. Lungs are clear to auscultation, no crackles or wheezes. Heart:  Normal rate and regular rhythm. S1 and S2 normal without gallop, murmur, click, rub or  other extra sounds. Extremities:  no edema Neurologic:  no focal deficits   Impression & Recommendations:  Problem # 1:  CHEST PAIN, ATYPICAL (ICD-786.59) Assessment Improved His atypical CP is most likely GI related but b/c of his hx of CVA, HTN, HLD, and age, I think he deserves further evaluation. Plan to refer to [facility].  Orders: Cardiology Referral (Cardiology) [facility]- Est Level  3 ([address])  Problem # 2:  HYPERLIPIDEMIA (ICD-272.4) Assessment: Unchanged Last LDL 145 (01/2009) Plan to recheck LDL today. Will adjust Simvastatin as needed.  Desires med to be sent to [facility] on [address]  His updated medication list for this problem includes:    Simvastatin 40 Mg Tabs (Simvastatin) .Marland Kitchen... 1 tablet by mouth at bedtime for high cholesterol  Orders: T-Lipoprotein (LDL cholesterol)  ([id number]) Cardiology Referral (Cardiology) [facility]- Est Level  3 ([address])  Complete Medication List: 1)  Hydrochlorothiazide 25 Mg Tabs (Hydrochlorothiazide) .... Take 1 tab by mouth every morning 2)  Simvastatin 40 Mg Tabs (Simvastatin) .Marland Kitchen.. 1 tablet by mouth at bedtime for high cholesterol 3)  Tylenol Ex St Arthritis Pain 500 Mg Tabs (Acetaminophen) .... Take two tablets by mouth every 8 hours as needed for pain 4)  Anacin 81 Mg Tbec (Aspirin) .... One tablet by  mouth daily 5)  Viagra 50 Mg Tabs (Sildenafil citrate) .Marland Kitchen.. 1 tablet by mouth daily as needed for sexual dysfunction  Patient Instructions: 1)  We are setting you up for a cardiology evaluation for your history of atypical chest pain.   2)  You have many risk factors for heart disease so I want to have you  evaluated.  3)  I'll get you a refill on the simvastatin once I get the cholesterol results.   Prevention & Chronic Care Immunizations   Influenza vaccine: refused  (11/27/2007)   Influenza vaccine deferral: Refused  (02/07/2009)   Influenza vaccine due: Refused  (11/27/2007)    Tetanus booster: 05/29/2002: Done.   Tetanus  booster due: 05/28/2012    Pneumococcal vaccine: Not documented    H. zoster vaccine: Not documented   H. zoster vaccine deferral: Refused  (02/07/2009)  Colorectal Screening   Hemoccult: refused  (11/27/2007)   Hemoccult due: Not Indicated    Colonoscopy: Done.  (10/29/2002)   Colonoscopy due: 10/28/2012  Other Screening   PSA: 0.63  (03/22/2008)   Smoking status: never  (07/11/2009)  Lipids   Total Cholesterol: 201  (02/07/2009)   Lipid panel action/deferral: LDL Direct Ordered   LDL: 161  (02/07/2009)   LDL Direct: 147  (11/27/2007)   HDL: 43  (02/07/2009)   Triglycerides: 66  (02/07/2009)    SGOT (AST): 34  (02/07/2009)   BMP action: Ordered   SGPT (ALT): 28  (02/07/2009)   Alkaline phosphatase: 88  (02/07/2009)   Total bilirubin: 0.5  (02/07/2009)    Lipid flowsheet reviewed?: Yes   Progress toward LDL goal: Unchanged  Hypertension   Last Blood Pressure: 122 / 72  (07/11/2009)   Serum creatinine: 1.14  (02/07/2009)   BMP action: Ordered   Serum potassium 4.4  (02/07/2009)    Hypertension flowsheet reviewed?: Yes   Progress toward BP goal: At goal  Self-Management Support :   Personal Goals (by the next clinic visit) :      Personal blood pressure goal: 140/90  (02/07/2009)     Personal LDL goal: 130  (02/07/2009)    Hypertension self-management support: BP self-monitoring log, Written self-care plan, Education handout  (05/10/2009)    Lipid self-management support: Written self-care plan, Education handout  (02/07/2009)

## 2010-02-27 NOTE — Progress Notes (Signed)
Summary: Rx  Medications Added LEVITRA 20 MG TABS (VARDENAFIL HCL) SIG: Take 1/2 to 1 tab by mouth as needed DISP Quant permitted for 1 month, or #10       Phone Note Call from Patient   Reason for Call: Talk to Nurse Summary of Call: pt is requesting his rx of viagra to be changed to levitra 20mg  b/c he can get it cheaper at [facility]/[address] Initial call taken by: Knox Royalty,  July 25, 2009 2:43 PM  Follow-up for Phone Call        to PCP Follow-up by: Gladstone Pih,  July 25, 2009 4:22 PM  Additional Follow-up for Phone Call Additional follow up Details #1::        wants to know if meds have been called in Additional Follow-up by: De Nurse,  July 26, 2009 9:55 AM    Additional Follow-up for Phone Call Additional follow up Details #2::    called both numbers . he is not at either. will send to md.  when he calls back-this may take more than 1 day Follow-up by: Golden Circle RN,  July 26, 2009 10:01 AM  Additional Follow-up for Phone Call Additional follow up Details #3:: Details for Additional Follow-up Action Taken: told him I will send this to his new md & call him when it is done    Additional Follow-up by: Golden Circle RN,  July 27, 2009 10:56 AM  New/Updated Medications: LEVITRA 20 MG TABS (VARDENAFIL HCL) SIG: Take 1/2 to 1 tab by mouth as needed DISP Quant permitted for 1 month, or #10  pt is calling again - will call back after lunch to check again. De Nurse  July 28, 2009 10:30 AM   Prescriptions: LEVITRA 20 MG TABS (VARDENAFIL HCL) SIG: Take 1/2 to 1 tab by mouth as needed DISP Quant permitted for 1 month, or #10  #10 x 0   Entered and Authorized by:   Paula Compton MD   Signed by:   Paula Compton MD on 07/28/2009   Method used:   Electronically to        [facility] [phone]* (retail)       [address]       [place], [place]  [address]       Ph: [phone]       Fax: [phone]   RxID:   [id number]  to preceptor as old pcp is  gone & new one has not yet seen him.Golden Circle RN  July 28, 2009 10:34 AM  spoke with wife & lm that a rx was at his pharmacy...Marland KitchenMarland KitchenGolden Circle RN  July 28, 2009 11:03 AM

## 2010-02-27 NOTE — Miscellaneous (Signed)
Summary: medication correction  Medication correction.  Plan to increases simvastatin to 60mg  at bedtime.......Marland KitchenMarisue Ivan, MD  Medications Added SIMVASTATIN 40 MG TABS (SIMVASTATIN) 1 1/2 tabs by mouth at bedtime       Clinical Lists Changes  Medications: Changed medication from SIMVASTATIN 80 MG TABS (SIMVASTATIN) 1 tab by mouth at bedtime to SIMVASTATIN 40 MG TABS (SIMVASTATIN) 1 1/2 tabs by mouth at bedtime - Signed Rx of SIMVASTATIN 40 MG TABS (SIMVASTATIN) 1 1/2 tabs by mouth at bedtime;  #90 x 1;  Signed;  Entered by: Brian Ivan  MD;  Authorized by: Brian Ivan  MD;  Method used: Electronically to [facility] [phone]*, [address] [place], [place]  [address], Ph: [phone], Fax: [phone]    Prescriptions: SIMVASTATIN 40 MG TABS (SIMVASTATIN) 1 1/2 tabs by mouth at bedtime  #90 x 1   Entered and Authorized by:   Brian Ivan  MD   Signed by:   Brian Ivan  MD on 07/13/2009   Method used:   Electronically to        [facility] [phone]* (retail)       [address]       [place], [place]  [address]       Ph: [phone]       Fax: [phone]   RxID:   [id number]

## 2010-02-27 NOTE — Assessment & Plan Note (Signed)
Summary: diaphoretic episode, f/u HTN and HLD   Vital Signs:  Patient profile:   65 year old male Height:      74.75 inches Weight:      233.3 pounds BMI:     29.46 Temp:     97.4 degrees F oral Pulse rate:   59 / minute BP sitting:   180 / 84  (left arm) Cuff size:   regular  Vitals Entered By: Gladstone Pih (February 28, 2009 3:25 PM)  Serial Vital Signs/Assessments:  Time      Position  BP       Pulse  Resp  Temp     By 3:26 PM             162/89                         Gladstone Pih  Comments: 3:26 PM Re check Manually By: Gladstone Pih   CC: F/U apt Is Patient Diabetic? No Pain Assessment Patient in pain? no      Comments been out of HTN meds for awhile   Primary Care Provider:  Marisue Ivan  MD  CC:  F/U apt.  History of Present Illness: 65yo M that presents c/o diaphoretic episode 4 days ago and elevated BP  Diaphoretic episode: Occurred Sat while he was installing a door at home.  Denied any chest pain, sob, palpitations, or N/V during the event.  Lasted less than an hour.  Has not happened again.  He was so worried about the episode that he wanted to be evaluated.  He expresses that he has had intermittent "strange sensation" of his left arm and R lower leg since that episode.  No true weakness, no difficulty with speech.  He just wanted to be seen "to make sure I was ok" per pt.  Elevated BP: Pt states that he went to [facility] to pick up his medication after seeing me in the office and they told him they didn't have his medication (HCTZ).  He has been out of meds since and wants to make sure it's not his BP that caused the symptoms.  Again he denies any headache, vision changes, dizziness, or syncopal episodes.  HLD: Tolerating medication (Simvastatin).  No adverse effects.  No new muscle pain or abd pain.   Habits & Providers  Alcohol-Tobacco-Diet     Tobacco Status: never  Allergies: No Known Drug Allergies  Social History: Smoking Status:   never  Review of Systems       Denied any chest pain, sob, palpitations, or N/V during the event.   Physical Exam  General:  VS Reviewed. Well appearing, NAD. No change in speech Head:  normocephalic and atraumatic.   no facial droop Eyes:  EOMI PERRLA Neck:  full ROM Lungs:  Normal respiratory effort, chest expands symmetrically. Lungs are clear to auscultation, no crackles or wheezes. Heart:  Normal rate and regular rhythm. S1 and S2 normal without gallop, murmur, click, rub or other extra sounds. Neurologic:  alert & oriented X3, cranial nerves II-XII intact, strength normal in all extremities, gait normal, and DTRs symmetrical and normal.   Sensation grossly intact no focal deficits   Impression & Recommendations:  Problem # 1:  DIAPHORESIS (ICD-780.8) Assessment New Unsure of the cause.  No further episodes.  Low suspicion for cardiac event or neurological event.  No neurological deficits on exam.  He is to call me if this happens again.  Orders: [facility]- Est Level  3 ([address])  Problem # 2:  HYPERTENSION, BENIGN SYSTEMIC (ICD-401.1) Assessment: Unchanged Not controlled due to not having his medication.  Gladstone Pih called [facility] and they had made a mistake.  His medications are now available and he has been contacted and made aware.  Will plan on calling him as planned on 2/12 to check in to see how his BP readings are running.  He is to make an effort to check his bp and keep a log for me.  His updated medication list for this problem includes:    Hydrochlorothiazide 25 Mg Tabs (Hydrochlorothiazide) .Marland Kitchen... Take 1 tab by mouth every morning  Orders: [facility]- Est Level  3 ([address])  Problem # 3:  HYPERLIPIDEMIA (ICD-272.4) Assessment: Unchanged He has picked up the Simvastatin.  Tolerating the new medication.  Will have him return for a lab visit in 2-3 months to recheck the LDL.  His updated medication list for this problem includes:    Simvastatin 40 Mg Tabs (Simvastatin)  .Marland Kitchen... 1 tablet by mouth at bedtime for high cholesterol  Complete Medication List: 1)  Hydrochlorothiazide 25 Mg Tabs (Hydrochlorothiazide) .... Take 1 tab by mouth every morning 2)  Simvastatin 40 Mg Tabs (Simvastatin) .Marland Kitchen.. 1 tablet by mouth at bedtime for high cholesterol 3)  Tylenol Ex St Arthritis Pain 500 Mg Tabs (Acetaminophen) .... Take two tablets by mouth every 8 hours as needed for pain 4)  Anacin 81 Mg Tbec (Aspirin) .... One tablet by mouth daily  Patient Instructions: 1)  I'll call you in 2 weeks to check up on you.   2)  Pick up your medicine as soon as possible. 3)  Call me if you have any more problems.

## 2010-02-27 NOTE — Progress Notes (Signed)
Summary: HCTZ 25mg  qd #90 x0   Phone Note Refill Request Call back at Home Phone [phone]   Refills Requested: Medication #1:  HYDROCHLOROTHIAZIDE 25 MG  TABS Take 1 tab by mouth every morning PHARMACY IS [facility] [address]  Initial call taken by: Clydell Hakim,  Jun 15, 2009 9:58 AM    Prescriptions: HYDROCHLOROTHIAZIDE 25 MG  TABS (HYDROCHLOROTHIAZIDE) Take 1 tab by mouth every morning  #90 x 0   Entered and Authorized by:   Marisue Ivan  MD   Signed by:   Marisue Ivan  MD on 06/15/2009   Method used:   Electronically to        [facility] [phone]* (retail)       [address]       [place], [place]  [address]       Ph: [phone]       Fax: [phone]   RxID:   [id number]

## 2010-02-27 NOTE — Assessment & Plan Note (Signed)
Summary: f/u atypical CP- improved   Vital Signs:  Patient profile:   65 year old male Height:      74.75 inches Weight:      227.2 pounds BMI:     28.69 Temp:     97.4 degrees F oral Pulse rate:   66 / minute BP sitting:   116 / 70  (left arm) Cuff size:   regular  Vitals Entered By: Gladstone Pih (May 10, 2009 2:09 PM) CC: F/U  chest pain Is Patient Diabetic? No Pain Assessment Patient in pain? no        Primary Care Provider:  Marisue Ivan  MD  CC:  F/U  chest pain.  History of Present Illness: 65yo M here for f/u s/p ER visit  ER visit: Seen on 05/02/2009 w/ acute chest pain.  Neg w/u in the ER.  POC were neg.  He refused hospitalization and left AMA.  States that he has not had any further symptoms since that single episode last week.    HTN: No adverse effects from medication.  Not checking it regularly.  Was not well controlled at last visit.  No dizziness, HA, CP, palpitations, or swelling.  Erectile dysfunction: Chronic issue.  Had tried viagra in the past but stopped using it b/c of cost.  Would like to get back on the viagra.  He is not on any nitrates.   Habits & Providers  Alcohol-Tobacco-Diet     Tobacco Status: never  Current Medications (verified): 1)  Hydrochlorothiazide 25 Mg  Tabs (Hydrochlorothiazide) .... Take 1 Tab By Mouth Every Morning 2)  Simvastatin 40 Mg Tabs (Simvastatin) .Marland Kitchen.. 1 Tablet By Mouth At Bedtime For High Cholesterol 3)  Tylenol Ex St Arthritis Pain 500 Mg  Tabs (Acetaminophen) .... Take Two Tablets By Mouth Every 8 Hours As Needed For Pain 4)  Anacin 81 Mg  Tbec (Aspirin) .... One Tablet By Mouth Daily 5)  Viagra 50 Mg Tabs (Sildenafil Citrate) .Marland Kitchen.. 1 Tablet By Mouth Daily As Needed For Sexual Dysfunction  Allergies (verified): No Known Drug Allergies  Review of Systems      See HPI  Physical Exam  General:  VS Reviewed. Well appearing, NAD.  Lungs:  Normal respiratory effort, chest expands symmetrically. Lungs are  clear to auscultation, no crackles or wheezes. Heart:  Normal rate and regular rhythm. S1 and S2 normal without gallop, murmur, click, rub or other extra sounds. Extremities:  no edema Neurologic:  no focal deficits   Impression & Recommendations:  Problem # 1:  CHEST PAIN, ATYPICAL (ICD-786.59) Assessment Improved No further episodes since last week.  I suspect that it was probably a brief esophageal spasm. POC neg in ER on 05/02/2009. Given his age and risk factors, plan to set him up for ETT.    Orders: ETT (ETT) [facility]- Est  Level 4 ([address])  Problem # 2:  HYPERTENSION, BENIGN SYSTEMIC (ICD-401.1) Assessment: Improved  At goal (<140/90). No changes to regimen. Will provide 6 month refill of HCTZ.  His updated medication list for this problem includes:    Hydrochlorothiazide 25 Mg Tabs (Hydrochlorothiazide) .Marland Kitchen... Take 1 tab by mouth every morning  Orders: [facility]- Est  Level 4 ([address])  Problem # 3:  IMPOTENCE, ORGANIC (ICD-607.84) Assessment: Unchanged  Will provide refill on viagra.  His updated medication list for this problem includes:    Viagra 50 Mg Tabs (Sildenafil citrate) .Marland Kitchen... 1 tablet by mouth daily as needed for sexual dysfunction  Orders: [facility]- Est  Level  4 ([address])  Complete Medication List: 1)  Hydrochlorothiazide 25 Mg Tabs (Hydrochlorothiazide) .... Take 1 tab by mouth every morning 2)  Simvastatin 40 Mg Tabs (Simvastatin) .Marland Kitchen.. 1 tablet by mouth at bedtime for high cholesterol 3)  Tylenol Ex St Arthritis Pain 500 Mg Tabs (Acetaminophen) .... Take two tablets by mouth every 8 hours as needed for pain 4)  Anacin 81 Mg Tbec (Aspirin) .... One tablet by mouth daily 5)  Viagra 50 Mg Tabs (Sildenafil citrate) .Marland Kitchen.. 1 tablet by mouth daily as needed for sexual dysfunction  Patient Instructions: 1)  Please schedule an appointment with your primary doctor after you complete the stress test. 2)  Call me if you have further chest pain. Prescriptions: VIAGRA 50 MG TABS  (SILDENAFIL CITRATE) 1 tablet by mouth daily as needed for sexual dysfunction  #5 x 1   Entered and Authorized by:   Marisue Ivan  MD   Signed by:   Marisue Ivan  MD on 05/10/2009   Method used:   Electronically to        [facility] [phone]* (retail)       [address]       [place], [place]  [address]       Ph: [phone]       Fax: [phone]   RxID:   [id number] HYDROCHLOROTHIAZIDE 25 MG  TABS (HYDROCHLOROTHIAZIDE) Take 1 tab by mouth every morning  #90 x 1   Entered and Authorized by:   Marisue Ivan  MD   Signed by:   Marisue Ivan  MD on 05/10/2009   Method used:   Electronically to        [facility] [phone]* (retail)       [address]       [place], [place]  [address]       Ph: [phone]       Fax: [phone]   RxID:   [id number]    Prevention & Chronic Care Immunizations   Influenza vaccine: refused  (11/27/2007)   Influenza vaccine deferral: Refused  (02/07/2009)   Influenza vaccine due: Refused  (11/27/2007)    Tetanus booster: 05/29/2002: Done.   Tetanus booster due: 05/28/2012    Pneumococcal vaccine: Not documented    H. zoster vaccine: Not documented   H. zoster vaccine deferral: Refused  (02/07/2009)  Colorectal Screening   Hemoccult: refused  (11/27/2007)   Hemoccult due: Not Indicated    Colonoscopy: Done.  (10/29/2002)   Colonoscopy due: 10/28/2012  Other Screening   PSA: 0.63  (03/22/2008)   Smoking status: never  (05/10/2009)  Lipids   Total Cholesterol: 201  (02/07/2009)   Lipid panel action/deferral: Lipid Panel ordered   LDL: 145  (02/07/2009)   LDL Direct: 147  (11/27/2007)   HDL: 43  (02/07/2009)   Triglycerides: 66  (02/07/2009)    SGOT (AST): 34  (02/07/2009)   BMP action: Ordered   SGPT (ALT): 28  (02/07/2009)   Alkaline phosphatase: 88  (02/07/2009)   Total bilirubin: 0.5  (02/07/2009)  Hypertension   Last Blood Pressure: 116 / 70  (05/10/2009)   Serum creatinine: 1.14   (02/07/2009)   BMP action: Ordered   Serum potassium 4.4  (02/07/2009)    Hypertension flowsheet reviewed?: Yes   Progress toward BP goal: At goal  Self-Management Support :   Personal Goals (by the next clinic visit) :      Personal blood pressure goal: 140/90  (02/07/2009)     Personal LDL  goal: 130  (02/07/2009)    Patient will work on the following items until the next clinic visit to reach self-care goals:     Medications and monitoring: take my medicines every day, check my blood pressure  (05/10/2009)     Eating: drink diet soda or water instead of juice or soda, eat more vegetables, use fresh or frozen vegetables, eat foods that are low in salt, eat baked foods instead of fried foods, eat fruit for snacks and desserts, limit or avoid alcohol  (05/10/2009)    Hypertension self-management support: BP self-monitoring log, Written self-care plan, Education handout  (05/10/2009)   Hypertension self-care plan printed.   Hypertension education handout printed    Lipid self-management support: Written self-care plan, Education handout  (02/07/2009)

## 2010-02-27 NOTE — Miscellaneous (Signed)
Summary: Remove old dx   Clinical Lists Changes  Problems: Removed problem of DIAPHORESIS (ICD-780.8) Removed problem of History of  CVA (ICD-434.91) Removed problem of TOBACCO USE, QUIT (ICD-V15.82) Observations: Added new observation of SMOK STATUS: quit (11/23/2009 15:40)        Habits & Providers  Alcohol-Tobacco-Diet     Tobacco Status: quit   Allergies: No Known Drug Allergies

## 2010-02-27 NOTE — Assessment & Plan Note (Signed)
Summary: Cardiology Nuclear Study  Nuclear Med Background Indications for Stress Test: Evaluation for Ischemia   History: Echo, GXT  History Comments: '04 GXT: (-) ischemia '09 Echo= 55-65%  Symptoms: Chest Pain, DOE, Fatigue    Nuclear Pre-Procedure Cardiac Risk Factors: Carotid Disease, Claudication, CVA, Family History - CAD, Hypertension, Lipids Caffeine/Decaff Intake: None NPO After: 8:30 PM Lungs: clear IV 0.9% NS with Angio Cath: 20g     IV Site: (R) AC IV Started by: Irean Hong RN Chest Size (in) 42     Height (in): 74 Weight (lb): 218 BMI: 28.09  Nuclear Med Study 1 or 2 day study:  1 day     Stress Test Type:  Eugenie Birks Reading MD:  Marca Ancona, MD     Referring MD:  P.Ross Resting Radionuclide:  Technetium 82m Tetrofosmin     Resting Radionuclide Dose:  11.0 mCi  Stress Radionuclide:  Technetium 71m Tetrofosmin     Stress Radionuclide Dose:  33.0 mCi   Stress Protocol   Lexiscan: 0.4 mg   Stress Test Technologist:  Milana Na EMT-P     Nuclear Technologist:  Domenic Polite CNMT  Rest Procedure  Myocardial perfusion imaging was performed at rest 45 minutes following the intravenous administration of Myoview Technetium 18m Tetrofosmin.  Stress Procedure  The patient received IV Lexiscan 0.4 mg over 15-seconds.  Myoview injected at 30-seconds.  There were no significant changes and a rare pvc with infusion.  Quantitative spect images were obtained after a 45 minute delay.  QPS Raw Data Images:  Normal; no motion artifact; normal heart/lung ratio. Stress Images:  NI: Uniform and normal uptake of tracer in all myocardial segments. Rest Images:  Normal homogeneous uptake in all areas of the myocardium. Subtraction (SDS):  There is no evidence of scar or ischemia. Transient Ischemic Dilatation:  1.04  (Normal <1.22)  Lung/Heart Ratio:  .30  (Normal <0.45)  Quantitative Gated Spect Images QGS EDV:  135 ml QGS ESV:  79 ml QGS EF:  41 % QGS cine  images:  Mild global hypokinesis.    Overall Impression  Exercise Capacity: Lexiscan study BP Response: Normal blood pressure response. Clinical Symptoms: Short of breath.  ECG Impression: No significant ST segment change suggestive of ischemia. Overall Impression: Normal perfusion images with no scar or ischemia noted.  Gated images showed global mild hypokinesis, EF 41%.  Overall Impression Comments: Possible nonischemic cardiomyopathy.  Would get echocardiogram to reassess EF.   Appended Document: Cardiology Nuclear Study normal stress nuclear scan.  No ischemia or scar.  I would get echo to confirm LV funciton (reported as a little depressed)  Appended Document: Cardiology Nuclear Study NA and no machine....will try again.  Appended Document: Cardiology Nuclear Study LM at work for call back.  Appended Document: Cardiology Nuclear Study Called patient with results. Will send order to [facility] for echo.

## 2010-03-12 ENCOUNTER — Encounter: Payer: Self-pay | Admitting: *Deleted

## 2010-03-14 ENCOUNTER — Other Ambulatory Visit: Payer: Self-pay | Admitting: *Deleted

## 2010-03-14 DIAGNOSIS — I10 Essential (primary) hypertension: Secondary | ICD-10-CM

## 2010-03-14 MED ORDER — HYDROCHLOROTHIAZIDE 25 MG PO TABS: 25.0000 mg | ORAL_TABLET | ORAL | Status: DC

## 2010-03-14 NOTE — Telephone Encounter (Signed)
Spoke with patient and advised to schedule appointment . States he has ben out of med for 2 months. consulted with Dr.Breen and he ok'd refill for one month. Appointment has ben scheduled for 03/30/2010

## 2010-03-30 ENCOUNTER — Encounter: Payer: Self-pay | Admitting: Family Medicine

## 2010-03-30 ENCOUNTER — Ambulatory Visit (INDEPENDENT_AMBULATORY_CARE_PROVIDER_SITE_OTHER): Payer: Managed Care, Other (non HMO) | Admitting: Family Medicine

## 2010-03-30 DIAGNOSIS — I10 Essential (primary) hypertension: Secondary | ICD-10-CM

## 2010-03-30 DIAGNOSIS — E785 Hyperlipidemia, unspecified: Secondary | ICD-10-CM

## 2010-03-30 DIAGNOSIS — M545 Low back pain: Secondary | ICD-10-CM

## 2010-03-30 DIAGNOSIS — I7389 Other specified peripheral vascular diseases: Secondary | ICD-10-CM

## 2010-03-30 LAB — CONVERTED CEMR LAB
BUN: 20 mg/dL (ref 6–23)
CO2: 24 meq/L (ref 19–32)
Calcium: 9 mg/dL (ref 8.4–10.5)
Chloride: 98 meq/L (ref 96–112)
Creatinine, Ser: 1.31 mg/dL (ref 0.40–1.50)
HCT: 39.5 % (ref 39.0–52.0)
Hemoglobin: 13 g/dL (ref 13.0–17.0)
MCHC: 32.9 g/dL (ref 30.0–36.0)
MCV: 83 fL (ref 78.0–100.0)
Platelets: 184 10*3/uL (ref 150–400)
Potassium: 3.7 meq/L (ref 3.5–5.3)
RBC: 4.76 M/uL (ref 4.22–5.81)
RDW: 15.2 % (ref 11.5–15.5)
Sodium: 135 meq/L (ref 135–145)
WBC: 4.1 10*3/uL (ref 4.0–10.5)

## 2010-03-30 LAB — BASIC METABOLIC PANEL
BUN: 20 mg/dL (ref 6–23)
CO2: 24 mEq/L (ref 19–32)
Chloride: 98 mEq/L (ref 96–112)
Creat: 1.31 mg/dL (ref 0.40–1.50)
Potassium: 3.7 mEq/L (ref 3.5–5.3)
Sodium: 135 mEq/L (ref 135–145)

## 2010-03-30 MED ORDER — SIMVASTATIN 40 MG PO TABS: 60.0000 mg | ORAL_TABLET | Freq: Every day | ORAL | Status: DC

## 2010-03-30 MED ORDER — LISINOPRIL 20 MG PO TABS: 20.0000 mg | ORAL_TABLET | Freq: Every day | ORAL | Status: DC

## 2010-03-30 MED ORDER — ACETAMINOPHEN 325 MG PO TABS: 650.0000 mg | ORAL_TABLET | Freq: Three times a day (TID) | ORAL | Status: DC

## 2010-03-30 NOTE — Assessment & Plan Note (Signed)
Encouraged patient to take scheduled tylenol for his back and other body pains.

## 2010-03-30 NOTE — Assessment & Plan Note (Signed)
Pt continues to have problems with claudication.  Will provide with a note for work allowing him to sit for 5 minutes out of every hour, encourage him to continue working, and engage in aerobic activity.

## 2010-03-30 NOTE — Assessment & Plan Note (Signed)
Well controlled currently.  Will switch from HCTZ to lisinopril due to possibility for sexual problems.  Will obtain BMET/CBC today for monitoring, f/u in 3 months to assess bp control.

## 2010-03-30 NOTE — Patient Instructions (Signed)
It was great to see you today! I am providing you with a note for work. Let's stop your medicine Hydraclorothiazide and start a medicine called Lisinopril.  This is for your blood pressure and should have less of an effect on your nature. Try taking tylenol every six hours. We will get some bloodwork today.  I'll send you a letter with the results.

## 2010-03-30 NOTE — Progress Notes (Signed)
S: Patient presents complaining of leg weakness with extended activity, and decreased sexual function that he feels is medication related.  Pt says that he has a lot of problems with leg weakness and pain, especially when he has exerted himself.  This has been present for several years and has gradually worsened.  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  He says that he did not actually fall but, if he had not been leaning against something, he would have fallen.  Pt also says that he thinks that his simvastatin has caused problems with sexual function.  He has stopped taking the pill and has not noticed much of a difference, however.  He also has tried Viagra and has not noticed much of a difference.  No muscle weakness, changes in urine color.  Finally, pt reports having burned his right arm about a week and a half ago when he accidentally dripped some hot wax on his arm.  He reports that there is no drainage/erythema/loss of function but he has had some issues with the area burning or hurting.  O: Filed Vitals:   03/30/10 1405  BP: 122/84  Pulse: 62  Temp: 98 F (36.7 C)   Gen: NAD CV: RRR no mrg Resp: CTABL Ext: pt has decreased pulses in lower extremities with smooth skin and loss of hair.  No open sores/ulcerations in lower extremities.  Has full ROM in all joints, no significant knee crepitus, no pain with palpation.  Linear burn on the right forearm and ac-fossa.  Is healing well with no ulceration/erythema or significant tenderness.

## 2010-03-30 NOTE — Assessment & Plan Note (Signed)
Discussed with the patient the importance of taking a statin due to his multiple vascular risk factors.  Also discussed that this medication is seldom related to sexual dysfunction but that the patient's hctz may be.  Will provide refill on this medication

## 2010-03-31 LAB — CBC
HCT: 39.5 % (ref 39.0–52.0)
Hemoglobin: 13 g/dL (ref 13.0–17.0)
MCH: 27.3 pg (ref 26.0–34.0)
MCV: 83 fL (ref 78.0–100.0)
Platelets: 184 10*3/uL (ref 150–400)
RBC: 4.76 MIL/uL (ref 4.22–5.81)

## 2010-04-18 LAB — DIFFERENTIAL
Basophils Absolute: 0 10*3/uL (ref 0.0–0.1)
Eosinophils Absolute: 0.1 10*3/uL (ref 0.0–0.7)
Eosinophils Relative: 2 % (ref 0–5)
Monocytes Absolute: 0.5 10*3/uL (ref 0.1–1.0)

## 2010-04-18 LAB — CBC
HCT: 37.4 % — ABNORMAL LOW (ref 39.0–52.0)
Hemoglobin: 12.4 g/dL — ABNORMAL LOW (ref 13.0–17.0)
MCHC: 33.2 g/dL (ref 30.0–36.0)
MCV: 86.9 fL (ref 78.0–100.0)
Platelets: 121 10*3/uL — ABNORMAL LOW (ref 150–400)
RBC: 4.3 MIL/uL (ref 4.22–5.81)
RDW: 15.4 % (ref 11.5–15.5)
WBC: 4.2 10*3/uL (ref 4.0–10.5)

## 2010-04-18 LAB — POCT CARDIAC MARKERS: Myoglobin, poc: 167 ng/mL (ref 12–200)

## 2010-04-18 LAB — POCT I-STAT, CHEM 8
Calcium, Ion: 1.16 mmol/L (ref 1.12–1.32)
Creatinine, Ser: 1.3 mg/dL (ref 0.4–1.5)
Hemoglobin: 13.3 g/dL (ref 13.0–17.0)
Sodium: 139 mEq/L (ref 135–145)
TCO2: 30 mmol/L (ref 0–100)

## 2010-06-05 ENCOUNTER — Ambulatory Visit (INDEPENDENT_AMBULATORY_CARE_PROVIDER_SITE_OTHER): Payer: Managed Care, Other (non HMO) | Admitting: Family Medicine

## 2010-06-05 ENCOUNTER — Encounter: Payer: Self-pay | Admitting: Family Medicine

## 2010-06-05 DIAGNOSIS — R5381 Other malaise: Secondary | ICD-10-CM

## 2010-06-05 DIAGNOSIS — R5383 Other fatigue: Secondary | ICD-10-CM | POA: Insufficient documentation

## 2010-06-05 DIAGNOSIS — I7389 Other specified peripheral vascular diseases: Secondary | ICD-10-CM

## 2010-06-05 LAB — BASIC METABOLIC PANEL
BUN: 20 mg/dL (ref 6–23)
CO2: 26 mEq/L (ref 19–32)
Glucose, Bld: 82 mg/dL (ref 70–99)
Potassium: 3.9 mEq/L (ref 3.5–5.3)
Sodium: 135 mEq/L (ref 135–145)

## 2010-06-05 LAB — TSH: TSH: 3.428 u[IU]/mL (ref 0.350–4.500)

## 2010-06-05 MED ORDER — SIMVASTATIN 40 MG PO TABS: 60.0000 mg | ORAL_TABLET | Freq: Every day | ORAL | Status: DC

## 2010-06-05 MED ORDER — CILOSTAZOL 100 MG PO TABS: 100.0000 mg | ORAL_TABLET | Freq: Two times a day (BID) | ORAL | Status: DC

## 2010-06-05 NOTE — Patient Instructions (Signed)
It was great to see you today! We will try a medicine called Pletal for your leg pain.  If you have problems with chest pain or aren't able to walk as far, stop taking it right away and get in touch with me. We will check your thyroid today to make sure your problems with fatigue aren't being caused by it. I want to see you back in 4-6 weeks to see how the Pletal is working for you.

## 2010-06-12 NOTE — H&P (Signed)
NAME:  GABE, GLACE NO.:  [id number]   MEDICAL RECORD NO.:  [id number]          PATIENT TYPE:  INP   LOCATION:  3735                         FACILITY:  [facility]   PHYSICIAN:  Wayne A. Sheffield Slider, M.D.    DATE OF BIRTH:  04-19-1945   DATE OF ADMISSION:  06/22/2007  DATE OF DISCHARGE:                              HISTORY & PHYSICAL   PRIMARY CARE PHYSICIAN:  Marisue Ivan, MD, [facility].   CHIEF COMPLAINT:  Slurred speech and left-sided facial droop.   HISTORY OF PRESENT ILLNESS:  This is a 65 year old male with history of  hypertension, hyperlipidemia, osteoarthritis, right-handed, who  presented to [facility] ED with a new onset of slurred speech and left-  sided facial droop.  He has no prior history of TIA or CVAs.  He was  last seen at baseline this morning at 10 a.m. and was first noticed by  his wife to have a slurred speech around 11 a.m.  He had no history of  trauma.  The patient was unaware of his symptoms.  He complains of  occipital headache and mild right arm posterior numbness.  He denies any  weakness of the extremities.   PAST MEDICAL HISTORY:  1. Hypertension.  2. Hyperlipidemia.  3. Osteoarthritis.  4. Overweight.   PAST SURGICAL HISTORY:  None.   MEDICATIONS:  1. Hydrochlorothiazide 25 mg p.o. daily  2. Pravastatin 40 mg p.o. nightly.   ALLERGIES:  No known drug allergies.   SOCIAL HISTORY:  Lives with his wife.  He works as a Public affairs consultant at [employer].  He has a half pack per day history for 40 years of  tobacco abuse, but did quit in 2006.  No alcohol or drug abuse.   FAMILY HISTORY:  His mother died of MI at the age of 84.  Father died in  accident in 78s.  His brother died at the age of 110 with known heart,  kidney, and lung disease.   REVIEW OF SYSTEMS:  Denies any fevers, chills, sweats, nausea, appetite  changes, fatigue, weight changes, chest pain, edema, palpitations,  cough, dyspnea, wheezing,  vomiting, diarrhea, abdominal pain, vision  changes, or weakness.  He endorses headaches, history of recent ear  pain, and numbness of the right posterior arm.   PHYSICAL EXAMINATION:  VITAL SIGNS:  Temperature is 96.8, pulse 95,  respiratory rate 20, and 95% on room air, and blood pressure 159/101.  GENERAL:  He is well appearing with obvious left facial droop, and he is  in no apparent distress.  HEENT:  Extraocular movements intact.  Vision grossly intact.  Pupils  equal, round, and reactive to light and accommodation.  Tongue deviates  to the left when asked to extend.  NECK:  No abnormalities.  He moves in flexion, extension, and lateral  rotation without difficulties.  CARDIOVASCULAR:  Regular rate and rhythm.  No murmurs, rubs, or gallops.  No carotid bruits.  No JVD.  LUNGS:  Clear to auscultation bilaterally.  ABDOMEN:  Soft, nontender, and nondistended.  EXTREMITIES:  Moves all extremities.  No edema.  NEUROLOGIC:  Cranial nerves II through XII grossly intact.  Sensation to  sharp pressure, light touch were intact, and 5/5 muscle strength in all  extremities.  No focal deficit except for the facial droop.  DTRs 2+.  He was alert and oriented x3 to person, place, and time.  Negative  Romberg, was able to ambulate without assistance.   LABS AND STUDIES:  He had a head CT, upon admission it showed no acute  intracranial findings, mild-to-chronic microvascular matter disease and  old lacunar infarcts.  EKG shows sinus bradycardia, rate in the 50s,  regular rate and rhythm.  No AFib.  No ST changes, did have some T-wave  changes in inferior and lateral leads.  Sodium 136, potassium 3.8,  chloride 102, BUN 22, creatinine 1.3, and glucose of 111.  Hemoglobin  13.9.   ASSESSMENT AND PLAN:  This is a 65 year old male with history of  hypertension and hyperlipidemia with a new-onset of slurred speech and  left-sided facial droop.  1. Neurologic:  This is a cerebrovascular accident  versus a transient      ischemic attack given a time frame.  Head CT is negative for acute      infarct or hemorrhage.  There is evidence of old lacunar infarcts      on the CT.  The patient is out of the window for thrombolysis.  EKG      and CR monitor conveys bradycardia without atrial fibrillation.      Plan is to start on aspirin 325 mg at this time, maximize with      Lipitor to 80 mg, continue current blood pressure medications, and      care for not to drop his blood pressures suddenly.  We will obtain      carotid Dopplers and a 2D echo to investigate possible embolic      sources.  We will hold on the MRI and MRA for now as this may not      change our management.  We will check labs in the morning.  Coags,      fasting lipid panel, CBC, and CMET.  We will also obtain neurologic      checks, place on telemetry and have a swallow study at bedside, and      physical therapy and occupational therapy with speech consults.  2. Abnormal EKG.  He was noted to have inverted T-waves.  Plan to      cycle cardiac enzymes, place him on the telemetry and repeat EKG in      the morning.  He has no known history of myocardial infarction or      coronary artery disease, although he has risk factors and a known      family history.  3. Hypertension.  We will continue him currently on      hydrochlorothiazide.  We will not add any other agents at this      time, and he is not to drop his blood pressure.  4. Hyperlipidemia.  His last lipid panel was drawn on February 09, 2007, with a cholesterol of 230, triglycerides 72, HDL 53, and LDL      of 163.  We will check fasting lipid panels in the morning, given      the fact that he was off his statin during that time in January.      We will increase his statin to 80 mg and change  it to Lipitor at      this time.  5. Fluids, electrolytes, nutrition.  We will place him on NPO until he      passes his swallow study, then we will place him on  heart-healthy      diet.  6. Prophylaxis.  The patient is able to ambulate, so he will ambulate      ad lib.  7. Disposition.  This is pending, his labs, clinical status; will need      PT, OT, and Speech Therapy consult.  We will admit to the [facility] Teaching Service Inpatient Status.  Anticipate a short      stay.  He is a full code.      Marisue Ivan, MD  Electronically Signed      Arnette Norris. Sheffield Slider, M.D.  Electronically Signed    KL/MEDQ  D:  06/22/2007  T:  06/23/2007  Job:  [id number]

## 2010-06-12 NOTE — Discharge Summary (Signed)
NAME:  Brian Burns, Brian Burns NO.:  [id number]   MEDICAL RECORD NO.:  [id number]          PATIENT TYPE:  INP   LOCATION:  3735                         FACILITY:  [facility]   PHYSICIAN:  Leighton Roach McDiarmid, M.D.DATE OF BIRTH:  1945/10/10   DATE OF ADMISSION:  06/22/2007  DATE OF DISCHARGE:  06/24/2007                               DISCHARGE SUMMARY   PRIMARY CARE PHYSICIAN:  Marisue Ivan, MD at [facility].   DISCHARGE DIAGNOSES:  1. New cerebrovascular accident.  2. Hyperlipidemia.  3. Hypertension.  4. Osteoarthritis.   DISCHARGE MEDICATIONS:  1. Hydrochlorothiazide/lisinopril 25/10 one tablet p.o. daily.  2. Zocor 80 mg p.o. nightly.  3. Aspirin 81 mg p.o. daily  4. Tylenol 1000 mg p.o. q.8 h. p.r.n. for arthritis pain.   CONSULTS:  None.   PROCEDURES:  None.   LABORATORIES:  Upon admission, the patient had a head CT that showed no  acute intracranial findings, mild chronic microvascular matter disease,  and old lacunar infarcts.  The patient had an EKG that showed sinus  brady.  No AFib, no ST changes, did have some inverted T-waves in the  lateral inferior leads.  I-STAT was within normal limits.  Urine drug  screen was negative.  Coags were normal.  The patient during hospital  stay had cholesterol of 241, triglycerides of 113, HDL of 34, LDL of  184.  Also, had a cardiac enzymes and troponin 0.03 and 0.03.  Cardiac  enzymes were negative.  The patient also had a MRI and MRA that showed  an acute infarct in the posterior limb of the internal capsule of the  right side.  Also, had some atrophy and extensive chronic microvascular  disease.  Carotid Dopplers are pending and echo that is also pending.   BRIEF HOSPITAL COURSE:  This is a 65 year old male with history of  hypertension and hyperlipidemia was admitted for new onset of right CVA  with left facial droop and slurred speech.  During his admission, he was  started on aspirin high dose  and was transitioned over to low-dose  aspirin upon discharge.  He had a head CT with the findings as stated  above.  Also, underwent an MRI and MRA that did convey an acute infarct  in the posterior limb of the internal capsule of the right side  consistent with his clinical findings.  He also had some risks  stratification noted to have elevated cholesterol and also elevated LDL  of 184 despite being on pravastatin.  Due to this reason, he was  switched over from pravastatin to simvastatin given his financial  burden.  He was not switched to Lipitor, but instead Zocor 80 mg.  Given  his new CVA, his blood pressure medication was also switched from  hydrochlorothiazide to hydrochlorothiazide and lisinopril.  This was  started at a low dose and to be titrated upwards following his  creatinine which would be checked as outpatient.  The patient's EKG to  rule out any cardiac deficits was negative.  The patient did undergo  echo and Dopplers which results were  still pending at this point.   FOLLOW-UP INSTRUCTIONS:  He has a followup scheduled Dr. Burnadette Pop at  [facility] on July 01, 2007, at 3:30 p.m.  He was  instructed not to drive until that appointment.  He was also instructed  to return to the ED if he has further symptoms of stroke.  Please follow-  up on echo results and also on carotid Doppler results and please check  the creatinine at his next office visit.  The patient is discharged in  stable condition.      Marisue Ivan, MD  Electronically Signed      Leighton Roach McDiarmid, M.D.  Electronically Signed    KL/MEDQ  D:  06/24/2007  T:  06/25/2007  Job:  [id number]

## 2010-06-15 NOTE — Op Note (Signed)
   NAME:  Brian Burns, Brian Burns                         ACCOUNT NO.:  [id number]   MEDICAL RECORD NO.:  [id number]                   PATIENT TYPE:  AMB   LOCATION:  ENDO                                 FACILITY:  [facility]   PHYSICIAN:  Graylin Shiver, M.D.                DATE OF BIRTH:  1945-12-27   DATE OF PROCEDURE:  12/07/2002  DATE OF DISCHARGE:                                 OPERATIVE REPORT   PROCEDURE PERFORMED:  Colonoscopy.   INDICATIONS FOR PROCEDURE:  Screening.   Informed consent was obtained after explanation of the risks of bleeding,  infection, and perforation.   PREMEDICATIONS:  Fentanyl 50 mcg  IV, Versed 5 mg IV.   DESCRIPTION OF PROCEDURE:  With the patient in the left lateral decubitus  position, a rectal exam was performed and no masses were felt.  The Olympus  colonoscope was inserted into the rectum and advanced around the colon to  the cecum.  Cecal landmarks were identified.  The cecum and ascending colon  were normal.  The transverse colon normal.  The descending colon, sigmoid  and rectum were normal.  The patient tolerated the procedure well without  complications.   IMPRESSION:  Normal colonoscopy to the cecum.                                               Graylin Shiver, M.D.    Brian Burns  D:  12/07/2002  T:  12/07/2002  Job:  [id number]

## 2010-06-18 ENCOUNTER — Encounter: Payer: Self-pay | Admitting: Family Medicine

## 2010-06-18 NOTE — Assessment & Plan Note (Signed)
Do not feel that this is an acute issue but will go ahead and check a TSH today in case it is involved with this issue.  Have a recent Hbg that demonstrates no anemia.  This is likely a chronic issue that is unlikely to completely resolve.  Will continue to encourage exercise and healthy eating.

## 2010-06-18 NOTE — Assessment & Plan Note (Signed)
As this problem is causing significant problems, will give trial on Pletal.  Continue to encourage ambulation/aerobic exercise.  If Pletal does not help symptoms significntly, will begin discussions about referal to vascular surgery for possible revascularization.  As the patient has significant arterial disease and history of smoking, do not feel it is necessary for me to perform other studies to positively confirm the diagnosis.  I would rather refer to vascular and allow them to determine what studies are necessary, assuming that the patient would be interested in surgery in the future.

## 2010-06-18 NOTE — Progress Notes (Signed)
Subjective: Pt presents today for followup.  Reports that he continues to have problems with leg pain that is primarily related to walking.  Has not significantly worsened since his previous visit.  Also reports that his supervisor is not very understanding of his problems and this is causing significant problems.  He reports some problems with fatigue that have been present for some time but he feels is worsening.  No SOB/DOE, no CP, no lightheadedness, no fevers/chills, or weight changes.  ROS: Per HPI, otherwise unremarkable.  Objective:  Filed Vitals:   06/05/10 1422  BP: 126/76  Pulse: 66  Temp: 97.8 F (36.6 C)   Gen: NAD, alert, cooperative with exam CV: RRR, no MRG Resp: CTABL, no wheezes noted Ext: No edema noted, reduced hair is present on lower extremities.  Small, healing wound on right lower extremity (shin), no drainage, erythema or tenderness.

## 2010-06-29 ENCOUNTER — Other Ambulatory Visit: Payer: Self-pay | Admitting: Family Medicine

## 2010-06-29 NOTE — Telephone Encounter (Signed)
Pt says he is out of bp medicine, says he was told at last office visit it was going to be changed, wants MD to send in whatever he was going to give him.

## 2010-07-03 MED ORDER — LOSARTAN POTASSIUM 50 MG PO TABS: 50.0000 mg | ORAL_TABLET | Freq: Every day | ORAL | Status: DC

## 2010-07-17 ENCOUNTER — Ambulatory Visit: Payer: Managed Care, Other (non HMO) | Admitting: Family Medicine

## 2010-10-24 LAB — DIFFERENTIAL
Basophils Absolute: 0
Basophils Relative: 0
Eosinophils Absolute: 0
Eosinophils Relative: 1
Lymphocytes Relative: 13
Lymphs Abs: 1.2
Monocytes Absolute: 0.6
Monocytes Relative: 6
Neutro Abs: 7.2
Neutrophils Relative %: 80 — ABNORMAL HIGH

## 2010-10-24 LAB — COMPREHENSIVE METABOLIC PANEL WITH GFR
ALT: 21
Alkaline Phosphatase: 79
BUN: 43 — ABNORMAL HIGH
Chloride: 98
Glucose, Bld: 152 — ABNORMAL HIGH
Potassium: 3.9
Sodium: 134 — ABNORMAL LOW
Total Bilirubin: 1.3 — ABNORMAL HIGH

## 2010-10-24 LAB — CARDIAC PANEL(CRET KIN+CKTOT+MB+TROPI)
CK, MB: 11.3 — ABNORMAL HIGH
CK, MB: 13.6 — ABNORMAL HIGH
Total CK: 703 — ABNORMAL HIGH
Total CK: 766 — ABNORMAL HIGH
Troponin I: 0.03

## 2010-10-24 LAB — CBC
HCT: 43.9
Hemoglobin: 14.9
MCHC: 34
MCHC: 34.5
MCV: 84.7
MCV: 84.8
Platelets: 133 — ABNORMAL LOW
Platelets: 196
RBC: 4.47
RBC: 5.18
RDW: 14.4
RDW: 14.8
WBC: 9

## 2010-10-24 LAB — URINALYSIS, ROUTINE W REFLEX MICROSCOPIC
Bilirubin Urine: NEGATIVE
Glucose, UA: NEGATIVE
Hgb urine dipstick: NEGATIVE
Ketones, ur: NEGATIVE
Nitrite: NEGATIVE
Protein, ur: NEGATIVE
Specific Gravity, Urine: 1.029
Urobilinogen, UA: 1
pH: 5.5

## 2010-10-24 LAB — COMPREHENSIVE METABOLIC PANEL
ALT: 22
AST: 25
AST: 31
Albumin: 3.9
CO2: 28
CO2: 29
Calcium: 9.1
Calcium: 9.1
Creatinine, Ser: 1.39
Creatinine, Ser: 1.53 — ABNORMAL HIGH
GFR calc Af Amer: 56 — ABNORMAL LOW
GFR calc Af Amer: >60
GFR calc non Af Amer: 47 — ABNORMAL LOW
GFR calc non Af Amer: 52 — ABNORMAL LOW
Sodium: 136
Total Protein: 6.3
Total Protein: 6.8

## 2010-10-24 LAB — LIPID PANEL
Cholesterol: 241 — ABNORMAL HIGH
HDL: 34 — ABNORMAL LOW
LDL Cholesterol: 184 — ABNORMAL HIGH
Total CHOL/HDL Ratio: 7.1

## 2010-10-24 LAB — POCT I-STAT, CHEM 8
BUN: 22
Calcium, Ion: 1.15
Chloride: 102
Creatinine, Ser: 1.3
Glucose, Bld: 111 — ABNORMAL HIGH
TCO2: 25

## 2010-10-24 LAB — RAPID URINE DRUG SCREEN, HOSP PERFORMED
Amphetamines: NOT DETECTED
Barbiturates: NOT DETECTED
Benzodiazepines: NOT DETECTED
Tetrahydrocannabinol: NOT DETECTED

## 2010-10-24 LAB — URINE MICROSCOPIC-ADD ON

## 2010-10-24 LAB — CK TOTAL AND CKMB (NOT AT ARMC)
CK, MB: 16.9 — ABNORMAL HIGH
Relative Index: 2
Total CK: 841 — ABNORMAL HIGH

## 2010-10-24 LAB — URINE CULTURE
Colony Count: NO GROWTH
Culture: NO GROWTH

## 2010-10-24 LAB — OCCULT BLOOD X 1 CARD TO LAB, STOOL: Fecal Occult Bld: NEGATIVE

## 2010-10-24 LAB — APTT: aPTT: 32

## 2010-10-24 LAB — LIPASE, BLOOD: Lipase: 29

## 2010-10-30 ENCOUNTER — Ambulatory Visit (INDEPENDENT_AMBULATORY_CARE_PROVIDER_SITE_OTHER): Payer: Self-pay | Admitting: Family Medicine

## 2010-10-30 ENCOUNTER — Encounter: Payer: Self-pay | Admitting: Family Medicine

## 2010-10-30 VITALS — BP 175/112 | HR 52 | Temp 98.3°F | Ht 76.0 in | Wt 222.5 lb

## 2010-10-30 DIAGNOSIS — E785 Hyperlipidemia, unspecified: Secondary | ICD-10-CM

## 2010-10-30 DIAGNOSIS — I10 Essential (primary) hypertension: Secondary | ICD-10-CM

## 2010-10-30 DIAGNOSIS — I7389 Other specified peripheral vascular diseases: Secondary | ICD-10-CM

## 2010-10-30 MED ORDER — CILOSTAZOL 100 MG PO TABS: 100.0000 mg | ORAL_TABLET | Freq: Two times a day (BID) | ORAL | Status: DC

## 2010-10-30 MED ORDER — AMLODIPINE BESYLATE 10 MG PO TABS: 10.0000 mg | ORAL_TABLET | Freq: Every day | ORAL | Status: DC

## 2010-10-30 NOTE — Assessment & Plan Note (Signed)
The patient has stopped taking his simvastatin at this time. We will not restart any medicine for his cholesterol at this time, as I do not want him to think that any side effects from the hospital his blood pressure medications. I will plan on readdressing this issue at his next appointment, and we'll likely start him on a different medication at that time. I feel that Pravachol would likely be more appropriate given her use of amlodipine for blood pressure control.

## 2010-10-30 NOTE — Patient Instructions (Signed)
It was good to see you today. I want you to start taking the medicine acetaminophen 3 times a day. I wanted to take either 500 mg or 650 mg. Please talk with her pharmacist about finding this medicine. I want you to start taking 81 mg of aspirin every day. This is very important for you to avoid a heart attack. I am sending in a new prescription for your Pletal. Try this for leg pain. If she were not seeing significant improvement in 1-2 months we can talk again about the idea of surgery. Finally, I am sending in a prescription for the medicine Norvasc. This for your blood pressure. I want you to come back and see me in 2 weeks for [place] to check on your blood pressure.

## 2010-10-30 NOTE — Assessment & Plan Note (Signed)
The patient has not been taking his Pletal and is not currently interested in workup for surgery. Will give him a new prescription for the medication and urge him to try taking it.

## 2010-10-30 NOTE — Assessment & Plan Note (Signed)
As the patient is not interested in taking his losartan, we will try him on amlodipine. I am not convinced that he actually had an allergy or adverse reaction to losartan, however the patient will not be convinced of this. Return to clinic in 2 weeks for blood pressure check and medication management.

## 2010-10-30 NOTE — Progress Notes (Signed)
Subjective: Continues to have problems with leg pain with ambulation.  At last visit pt was given prescription for pletal and does not know if he ever got it filled.  Pain is always present to a certain extent but is made worse with walking and gets better with rest.  Current pain is 5/10.  Pain is achy/burning in character.  Pt has been taking Aleve 1-2 times daily.  Otherwise has not been taking any medications.  Pt took one Losartan and reports feeling bad and blurred vision so he stopped taking it all together.  Did have one episode of chest pain last week that was non-exertional and resolved spontaneously. The pain was relieved immediately following a large belch. He has not had any other episodes of chest pain.  Objective:  Filed Vitals:   10/30/10 1402  BP: 175/112  Pulse: 52  Temp: 98.3 F (36.8 C)   Gen: No acute distress CV: Regular rate and rhythm, no murmurs rubs or gallops Resp: Clear to auscultation bilaterally although they are decreased bilaterally Abd: Soft, nontender, nondistended  Extremities: 1+ pulses bilaterally, smooth skin of the lower extremities with lack of hair. No edema.  Assessment/Plan: Please also see individual problems in problem list for problem-specific plans.

## 2010-11-01 ENCOUNTER — Telehealth: Payer: Self-pay | Admitting: Family Medicine

## 2010-11-01 NOTE — Telephone Encounter (Signed)
Brian Burns is calling because he wants to let Dr. Louanne Belton know that the medication that was prescribed the other day makes him sleepy.  He would like Dr. Louanne Belton to give him a call back, but he will be leaving for work at 3:15.

## 2010-11-02 NOTE — Telephone Encounter (Signed)
Pt is worried that pletal might make min drowsy.  Informed him that we will not be able to tell this unless he starts taking it.  He can minimize problems by taking it first at night.  Also informed him that he needs to take a medication for 1-2 weeks before we will be able to tell what ultimate effect they will have one him.

## 2010-11-21 ENCOUNTER — Encounter: Payer: Self-pay | Admitting: Family Medicine

## 2010-11-21 ENCOUNTER — Ambulatory Visit (INDEPENDENT_AMBULATORY_CARE_PROVIDER_SITE_OTHER): Payer: Self-pay | Admitting: Family Medicine

## 2010-11-21 DIAGNOSIS — I10 Essential (primary) hypertension: Secondary | ICD-10-CM

## 2010-11-21 DIAGNOSIS — I7389 Other specified peripheral vascular diseases: Secondary | ICD-10-CM

## 2010-11-22 ENCOUNTER — Encounter: Payer: Self-pay | Admitting: Family Medicine

## 2010-11-22 NOTE — Assessment & Plan Note (Signed)
The patient's hypertension is much improved on his Norvasc. While I would prefer for it to be slightly low work considering his other risk factors, the patient has some severe financial strains at the moment and is unable to afford any additional medication. Accordingly we will continue his Norvasc at the current dose, continue to monitor, and readdress when he obtains insurance again, most likely in January.

## 2010-11-22 NOTE — Assessment & Plan Note (Signed)
Discussed with the patient that the next step would be a referral to vascular surgery. As the patient currently does not have any form of insurance all expenses for this would be out of pocket. He does not demonstrate any signs of critical stenosis so we have some time to deal with this. Per discussion with the patient we will readdress the issue of vascular surgery once he has obtain insurance.

## 2010-11-22 NOTE — Progress Notes (Signed)
Subjective: The patient presents today for follow-up of his hypertension and claudication.  1: hypertension: the patient has been taking his Norvasc and is tolerating it well. He is not having problems with headaches, blurred vision, chest pain, shortness of breath, or lower extremity edema.  2: claudication: the patient continues to have problems with claudication. He has managed to get his medication for this field, but is currently without insurance and will be unable to continue to afford this. His claudication is no worse than at his previous visit.  Objective:  Filed Vitals:   11/21/10 1407  BP: 130/75  Pulse: 71   Gen: No acute distress CV: Regular rate and rhythm Resp: Clear to auscultation bilaterally Extremities: diminished peripheral pulses in the lower extremities as previously noted, there continues to be a lack of hair on the lower extremities.  Assessment/Plan:  Please also see individual problems in problem list for problem-specific plans.

## 2010-11-22 NOTE — Patient Instructions (Signed)
It was great to see you today! Continue taking your Norvasc. We will talk more about your blood pressure once you have insurance. Beginning in January when you once more have insurance, we will readdress the issue of getting you to see vascular surgery for your legs.

## 2011-03-20 ENCOUNTER — Encounter: Payer: Self-pay | Admitting: Family Medicine

## 2011-03-20 ENCOUNTER — Ambulatory Visit (INDEPENDENT_AMBULATORY_CARE_PROVIDER_SITE_OTHER): Payer: Managed Care, Other (non HMO) | Admitting: Family Medicine

## 2011-03-20 VITALS — BP 147/88 | HR 56 | Temp 97.7°F | Ht 76.0 in | Wt 232.0 lb

## 2011-03-20 DIAGNOSIS — J069 Acute upper respiratory infection, unspecified: Secondary | ICD-10-CM

## 2011-03-20 NOTE — Assessment & Plan Note (Signed)
Advised nasal saline for congestion.  Otherwise ok to return to work.  Follow-up prn.

## 2011-03-20 NOTE — Progress Notes (Signed)
  Subjective:    Patient ID: Brian Burns, male    DOB: Jan 28, 1946, 66 y.o.   MRN: [id number]  HPI work in appt for cold sxs  2 days of nasal congestion, rhinorrhea.  Minimal cough.  No dyspnea, fever.  NO itchy eyes or nose.  No history of seasonal allergies.  Has not used any OTC meds    Review of Systemssee above     Objective:   Physical Exam GEN: Alert & Oriented, No acute distress HEENT: [place]/AT. EOMI, PERRLA, no conjunctival injection or scleral icterus.   Nares with edema and rhinorrhea.  Oropharynx is without erythema or exudates.  No anterior or posterior cervical lymphadenopathy. CV:  Regular Rate & Rhythm, no murmur Respiratory:  Normal work of breathing, CTAB         Assessment & Plan:

## 2011-03-20 NOTE — Patient Instructions (Signed)
Use nasal saline spray for nose congestion  Upper Respiratory Infection, Adult An upper respiratory infection (URI) is also sometimes known as the common cold. The upper respiratory tract includes the nose, sinuses, throat, trachea, and bronchi. Bronchi are the airways leading to the lungs. Most people improve within 1 week, but symptoms can last up to 2 weeks. A residual cough may last even longer.   CAUSES Many different viruses can infect the tissues lining the upper respiratory tract. The tissues become irritated and inflamed and often become very moist. Mucus production is also common. A cold is contagious. You can easily spread the virus to others by oral contact. This includes kissing, sharing a glass, coughing, or sneezing. Touching your mouth or nose and then touching a surface, which is then touched by another person, can also spread the virus. SYMPTOMS   Symptoms typically develop 1 to 3 days after you come in contact with a cold virus. Symptoms vary from person to person. They may include:  Runny nose.     Sneezing.    Nasal congestion.     Sinus irritation.     Sore throat.     Loss of voice (laryngitis).     Cough.    Fatigue.    Muscle aches.     Loss of appetite.     Headache.    Low-grade fever.  DIAGNOSIS   You might diagnose your own cold based on familiar symptoms, since most people get a cold 2 to 3 times a year. Your caregiver can confirm this based on your exam. Most importantly, your caregiver can check that your symptoms are not due to another disease such as strep throat, sinusitis, pneumonia, asthma, or epiglottitis. Blood tests, throat tests, and X-rays are not necessary to diagnose a common cold, but they may sometimes be helpful in excluding other more serious diseases. Your caregiver will decide if any further tests are required. RISKS AND COMPLICATIONS   You may be at risk for a more severe case of the common cold if you smoke cigarettes, have  chronic heart disease (such as heart failure) or lung disease (such as asthma), or if you have a weakened immune system. The very young and very old are also at risk for more serious infections. Bacterial sinusitis, middle ear infections, and bacterial pneumonia can complicate the common cold. The common cold can worsen asthma and chronic obstructive pulmonary disease (COPD). Sometimes, these complications can require emergency medical care and may be life-threatening. PREVENTION   The best way to protect against getting a cold is to practice good hygiene. Avoid oral or hand contact with people with cold symptoms. Wash your hands often if contact occurs. There is no clear evidence that vitamin C, vitamin E, echinacea, or exercise reduces the chance of developing a cold. However, it is always recommended to get plenty of rest and practice good nutrition. TREATMENT   Treatment is directed at relieving symptoms. There is no cure. Antibiotics are not effective, because the infection is caused by a virus, not by bacteria. Treatment may include:  Increased fluid intake. Sports drinks offer valuable electrolytes, sugars, and fluids.     Breathing heated mist or steam (vaporizer or shower).     Eating chicken soup or other clear broths, and maintaining good nutrition.     Getting plenty of rest.     Using gargles or lozenges for comfort.     Controlling fevers with ibuprofen or acetaminophen as directed by your caregiver.  Increasing usage of your inhaler if you have asthma.  Zinc gel and zinc lozenges, taken in the first 24 hours of the common cold, can shorten the duration and lessen the severity of symptoms. Pain medicines may help with fever, muscle aches, and throat pain. A variety of non-prescription medicines are available to treat congestion and runny nose. Your caregiver can make recommendations and may suggest nasal or lung inhalers for other symptoms.   HOME CARE INSTRUCTIONS    Only take  over-the-counter or prescription medicines for pain, discomfort, or fever as directed by your caregiver.     Use a warm mist humidifier or inhale steam from a shower to increase air moisture. This may keep secretions moist and make it easier to breathe.     Drink enough water and fluids to keep your urine clear or pale yellow.     Rest as needed.     Return to work when your temperature has returned to normal or as your caregiver advises. You may need to stay home longer to avoid infecting others. You can also use a face mask and careful hand washing to prevent spread of the virus.  SEEK MEDICAL CARE IF:    After the first few days, you feel you are getting worse rather than better.     You need your caregiver's advice about medicines to control symptoms.     You develop chills, worsening shortness of breath, or brown or red sputum. These may be signs of pneumonia.     You develop yellow or brown nasal discharge or pain in the face, especially when you bend forward. These may be signs of sinusitis.     You develop a fever, swollen neck glands, pain with swallowing, or white areas in the back of your throat. These may be signs of strep throat.  SEEK IMMEDIATE MEDICAL CARE IF:    You have a fever.     You develop severe or persistent headache, ear pain, sinus pain, or chest pain.     You develop wheezing, a prolonged cough, cough up blood, or have a change in your usual mucus (if you have chronic lung disease).     You develop sore muscles or a stiff neck.  Document Released: 07/10/2000 Document Revised: 09/26/2010 Document Reviewed: 05/18/2010 [employer] Patient Information 2012 [employer]. 

## 2011-04-09 ENCOUNTER — Encounter: Payer: Self-pay | Admitting: Family Medicine

## 2011-04-09 ENCOUNTER — Ambulatory Visit (INDEPENDENT_AMBULATORY_CARE_PROVIDER_SITE_OTHER): Payer: Managed Care, Other (non HMO) | Admitting: Family Medicine

## 2011-04-09 VITALS — BP 138/84 | HR 56 | Temp 97.4°F | Ht 76.0 in | Wt 227.0 lb

## 2011-04-09 DIAGNOSIS — I10 Essential (primary) hypertension: Secondary | ICD-10-CM

## 2011-04-09 DIAGNOSIS — M25569 Pain in unspecified knee: Secondary | ICD-10-CM

## 2011-04-09 DIAGNOSIS — R350 Frequency of micturition: Secondary | ICD-10-CM

## 2011-04-09 DIAGNOSIS — M25561 Pain in right knee: Secondary | ICD-10-CM

## 2011-04-09 DIAGNOSIS — M545 Low back pain: Secondary | ICD-10-CM

## 2011-04-09 DIAGNOSIS — E785 Hyperlipidemia, unspecified: Secondary | ICD-10-CM

## 2011-04-09 HISTORY — DX: Pain in right knee: M25.561

## 2011-04-09 LAB — COMPREHENSIVE METABOLIC PANEL
ALT: 16 U/L (ref 0–53)
Albumin: 4.3 g/dL (ref 3.5–5.2)
BUN: 17 mg/dL (ref 6–23)
CO2: 25 mEq/L (ref 19–32)
Calcium: 9.1 mg/dL (ref 8.4–10.5)
Chloride: 105 mEq/L (ref 96–112)
Creat: 1.27 mg/dL (ref 0.50–1.35)
Potassium: 4.2 mEq/L (ref 3.5–5.3)

## 2011-04-09 LAB — POCT UA - MICROSCOPIC ONLY

## 2011-04-09 LAB — POCT URINALYSIS DIPSTICK
Bilirubin, UA: NEGATIVE
Ketones, UA: NEGATIVE
Protein, UA: 100
Spec Grav, UA: 1.025
pH, UA: 6.5

## 2011-04-09 LAB — LDL CHOLESTEROL, DIRECT: Direct LDL: 175 mg/dL — ABNORMAL HIGH

## 2011-04-09 MED ORDER — ACETAMINOPHEN 500 MG PO TABS: 500.0000 mg | ORAL_TABLET | Freq: Three times a day (TID) | ORAL | Status: DC

## 2011-04-09 MED ORDER — TAMSULOSIN HCL 0.4 MG PO CAPS: 0.4000 mg | ORAL_CAPSULE | Freq: Every day | ORAL | Status: DC

## 2011-04-09 NOTE — Patient Instructions (Addendum)
I want you to start taking 500mg  of acetaminophen three times per day every day for your back and knee pain. I want you start back taking 81mg  of aspirin daily. Go to [facility] to get x-rays of your knees and spine. I want you to get in to see Dr. Raymondo Band, our pharmacist, for a test of the arteries in your legs. I am starting you on the medicine Flomax for your prostate.  Let's see if that helps with your night-time urination.

## 2011-04-09 NOTE — Assessment & Plan Note (Signed)
I would favor osteoarthritis as the diagnosis although peripheral vascular disease could cause similar symptoms. I will obtain a plain film of the left knee to evaluate for the presence of osteoarthritis. I do not feel any reason to also get a film of the right knee considering that his symptoms are bilateral and symmetric. I will also obtain ABIs as an initial step in the workup of peripheral vascular disease.

## 2011-04-09 NOTE — Assessment & Plan Note (Signed)
L-spine plain film today, otherwise symptomatic tx with, initially, tylenol.  RTC in ~2 weeks to see if is helping.  Am somewhat hesitant to use long term NSAIDs in him due to PVD/CAD.  DDx includes occult fx, musculoskeletal pain, OA.  Nerve impingement significantly less likely due to bilateral pain and lack of hard neuro findings.

## 2011-04-09 NOTE — Assessment & Plan Note (Signed)
Most likely related to BPH. I will start Flomax and see the patient back in 2 weeks to see if this has been at least somewhat effective.

## 2011-04-09 NOTE — Assessment & Plan Note (Signed)
Blood pressure is at goal currently. Continue Norvasc at current dose.

## 2011-04-09 NOTE — Progress Notes (Signed)
Subjective: The patient is a 66 y.o. year old male who presents today for follow up.  1) Knee/leg pain: patient continues to report pain.  About the same since last visit.  At last visit we discussed ABIs once has insurance.  Does not report any fevers/chills, edema, erythema.  Is made worse by walking/standing, better with sitting.  Takes occasional tylenol/motrin.  No radiation of pain.  2) Back pain: Constant, does have some intermittent feelings of subjective weakness but no falls.  No incontinence.  No radiation.  Located in low back to either side of spine.  No limitations in movement.  No numbness.  3)Urinary frequency: 3-4 times per night, does awaken, possibly some sense of incomplete emptying.  Not much problems with this during the day.  No incontinence.  Does have some problems with urgency.  Drinks probably 1 gallon of fluid per day.  Has cut back on evening fluid intake.  Social: No smoking at this time.  Objective:  Filed Vitals:   04/09/11 1030  BP: 138/84  Pulse: 56  Temp: 97.4 F (36.3 C)   Gen: NAD, overweight CV: RRR Resp: CTABL Back: Some tenderness to palpation laterally in the low back.  No point tenderness. Ext: Decreased hair bilateral lower extremities.  Some crepitus at bilateral knees.  No erythema, skin breakdown, or obvious swelling.  Assessment/Plan:  Please also see individual problems in problem list for problem-specific plans.

## 2011-04-09 NOTE — Assessment & Plan Note (Signed)
Has been off of medication now for some time. I will obtain a direct LDL and likely have to begin treatment again. His statin was held to do to his subjective weakness and possible myalgias. It has not improved since cessation of the medication.

## 2011-04-18 ENCOUNTER — Ambulatory Visit
Admission: RE | Admit: 2011-04-18 | Discharge: 2011-04-18 | Disposition: A | Discharge status: Home / Self Care | Payer: Managed Care, Other (non HMO) | Source: Ambulatory Visit | Attending: Family Medicine | Admitting: Family Medicine

## 2011-04-18 ENCOUNTER — Other Ambulatory Visit: Payer: Self-pay | Admitting: Family Medicine

## 2011-04-18 DIAGNOSIS — M25561 Pain in right knee: Secondary | ICD-10-CM

## 2011-04-18 DIAGNOSIS — M545 Low back pain: Secondary | ICD-10-CM

## 2011-04-18 DIAGNOSIS — M25562 Pain in left knee: Secondary | ICD-10-CM

## 2011-04-30 ENCOUNTER — Ambulatory Visit (INDEPENDENT_AMBULATORY_CARE_PROVIDER_SITE_OTHER): Payer: Managed Care, Other (non HMO) | Admitting: Pharmacist

## 2011-04-30 ENCOUNTER — Encounter: Payer: Self-pay | Admitting: Pharmacist

## 2011-04-30 VITALS — BP 185/102 | HR 58 | Ht 76.0 in | Wt 228.0 lb

## 2011-04-30 DIAGNOSIS — I7389 Other specified peripheral vascular diseases: Secondary | ICD-10-CM

## 2011-04-30 NOTE — Assessment & Plan Note (Signed)
Moderate PAD based on ABI of 0.7 in a patient with symptoms of numbness bilaterally and sharp pain in lower legs. . No change in medication therapy today.  Continue aspirin, Attempt restart of statin (intolerant of several statins) at next visit with Dr. Louanne Belton.  Results reviewed and written information provided.    Total time in face-to-face counseling 30 minutes.  Patient seen with Tana Conch, PharmD, Pharmacy Resident.

## 2011-04-30 NOTE — Patient Instructions (Signed)
Blood flow test showed decreased blood flow in your legs.     Follow up with Dr Louanne Belton in the next next 2-4 weeks.

## 2011-04-30 NOTE — Progress Notes (Signed)
  Subjective:    Patient ID: Brian Burns, male    DOB: 1945-08-11, 66 y.o.   MRN: [id number]  HPI  Reviewed and agree with Dr. Macky Lower management.   Review of Systems     Objective:   Physical Exam        Assessment & Plan:

## 2011-04-30 NOTE — Progress Notes (Signed)
  Subjective:    Patient ID: Brian Burns, male    DOB: [birth year], 66 y.o.   MRN: [id number]  HPI  Reports pain with walking and standing.  Pain is described as numbness and sharp. Onset of pain can be variable - sometimes occurs upon standing, but most times occurs after prolonged walking and standing. Reports knee pain, but no foot/leg pain when walking up hill. Reports pain when walking at an ordinary pace on a level surface. Reports pain immediately resolves once he sits down.  Pain is localized above the knee bilaterally and lower back.   Review of Systems  Objective:   Physical Exam  Lower extremity Physical Exam includes absent / diminished pulses, absence of limb hair, dryness, thinning of subcutaneous fat.   ABI overall = 0.7, moderate obstruction Right Arm 158 mmHg    Left Arm 164 mmHg Right ankle posterior tibial 152 mmHg     dorsalis pedis 130 mmHg Left ankle posterior tibial 114 mmHg    dorsalis pedis N/A - could not find pulse.     Assessment & Plan:  Moderate PAD based on ABI of 0.7 in a patient with symptoms of numbness bilaterally and sharp pain in lower legs. . No change in medication therapy today.  Continue aspirin, Attempt restart of statin (intolerant of several statins) at next visit with Dr. Louanne Belton.  Results reviewed and written information provided.    Total time in face-to-face counseling 30 minutes.  Patient seen with Tana Conch, PharmD, Pharmacy Resident.

## 2011-05-28 ENCOUNTER — Ambulatory Visit (INDEPENDENT_AMBULATORY_CARE_PROVIDER_SITE_OTHER): Payer: Managed Care, Other (non HMO) | Admitting: Family Medicine

## 2011-05-28 VITALS — BP 133/84 | HR 57 | Temp 97.3°F | Resp 18 | Ht 76.0 in | Wt 228.7 lb

## 2011-05-28 DIAGNOSIS — Z136 Encounter for screening for cardiovascular disorders: Secondary | ICD-10-CM

## 2011-05-28 DIAGNOSIS — E663 Overweight: Secondary | ICD-10-CM

## 2011-05-28 DIAGNOSIS — I7389 Other specified peripheral vascular diseases: Secondary | ICD-10-CM

## 2011-05-28 DIAGNOSIS — E785 Hyperlipidemia, unspecified: Secondary | ICD-10-CM

## 2011-05-28 MED ORDER — PRAVASTATIN SODIUM 20 MG PO TABS: 20.0000 mg | ORAL_TABLET | Freq: Every day | ORAL | Status: DC

## 2011-05-28 MED ORDER — TRAMADOL HCL 50 MG PO TABS: 50.0000 mg | ORAL_TABLET | Freq: Three times a day (TID) | ORAL | Status: DC | PRN

## 2011-05-28 NOTE — Progress Notes (Signed)
Patient ID: Brian Burns, male   DOB: February 03, 1945, 66 y.o.   MRN: [id number] Subjective: The patient is a 66 y.o. year old male who presents today for followup of bilateral leg pain.  1. Leg pain: Pt has had leg pain now for several years.  It is gradually worsening.  It is made worse by standing or walking and improved by sitting.  No problems with foot ulcers/wounds.  Has known history of CAD and long smoking history, although he does not smoke currently.  ABIs done recently at our clinic are mildly reduced at 0.7.  2. HLD: Pt has had problems with statins causing sexual dysfunction previously.  Is currently not on any medication for them  Patient's past medical, social, and family history were reviewed and updated as appropriate. Smoking status: former smoker  Objective:  Filed Vitals:   05/28/11 1454  BP: 133/84  Pulse: 57  Temp: 97.3 F (36.3 C)  Resp: 18   Gen: No acute distress, able to move without problems CV: RRR, no murmurs appreciated Resp: CTABL Ext: Decreased peripheral pulses, 1+ reflexes bilaterally, 5+ strength lower extremities, decreased hair on lower extremities.  No edema.  Assessment/Plan:  Please also see individual problems in problem list for problem-specific plans.

## 2011-05-28 NOTE — Patient Instructions (Signed)
I have sent in a prescription for the pain medication Tramadol for you to try taking for your leg pain. We will send you to a vascular surgeon to see if they think you need any procedures for your legs. We will set up the ultrasound of your aorta. I would like you to try the medicine pravastatin for your cholesterol.  We will try a low dose initially and see how you do with it.

## 2011-05-29 NOTE — Assessment & Plan Note (Signed)
Will restart statin due to significant comorbidities and monitor for side effects.  Will start at a low dose and increase as tolerated.

## 2011-05-29 NOTE — Assessment & Plan Note (Signed)
Patient is very symptomatic although his ABI's are not terrible.  I will refer to vascular surgery for further workup.  He has tried Pletal with no effect.  I am restarting him on a statin as noted elsewhere and will try to optimize medical therapy.  I am also giving a trial on tramadol to see if this helps his pain.

## 2011-05-31 ENCOUNTER — Other Ambulatory Visit: Payer: Self-pay

## 2011-05-31 DIAGNOSIS — I70219 Atherosclerosis of native arteries of extremities with intermittent claudication, unspecified extremity: Secondary | ICD-10-CM

## 2011-06-03 ENCOUNTER — Ambulatory Visit (HOSPITAL_COMMUNITY)
Admission: RE | Admit: 2011-06-03 | Discharge: 2011-06-03 | Disposition: A | Discharge status: Home / Self Care | Payer: Managed Care, Other (non HMO) | Source: Ambulatory Visit | Attending: Family Medicine | Admitting: Family Medicine

## 2011-06-03 ENCOUNTER — Telehealth: Payer: Self-pay | Admitting: *Deleted

## 2011-06-03 DIAGNOSIS — I714 Abdominal aortic aneurysm, without rupture, unspecified: Secondary | ICD-10-CM | POA: Insufficient documentation

## 2011-06-03 DIAGNOSIS — Z136 Encounter for screening for cardiovascular disorders: Secondary | ICD-10-CM

## 2011-06-03 NOTE — Telephone Encounter (Signed)
Message left on office voicemail with abdominal ultrasound results.  Mild distal abdominal aortic aneurysm---maximally 3.4 cm.  Will also fax report to our office.  Will route note to Dr. Louanne Belton.    Gaylene Brooks, RN

## 2011-07-03 ENCOUNTER — Encounter: Payer: Self-pay | Admitting: Vascular Surgery

## 2011-07-04 ENCOUNTER — Ambulatory Visit (INDEPENDENT_AMBULATORY_CARE_PROVIDER_SITE_OTHER): Payer: Managed Care, Other (non HMO) | Admitting: Vascular Surgery

## 2011-07-04 ENCOUNTER — Encounter: Payer: Self-pay | Admitting: Vascular Surgery

## 2011-07-04 VITALS — BP 180/110 | HR 48 | Resp 18 | Ht 76.0 in | Wt 230.0 lb

## 2011-07-04 DIAGNOSIS — I714 Abdominal aortic aneurysm, without rupture, unspecified: Secondary | ICD-10-CM

## 2011-07-04 DIAGNOSIS — I739 Peripheral vascular disease, unspecified: Secondary | ICD-10-CM

## 2011-07-04 DIAGNOSIS — M549 Dorsalgia, unspecified: Secondary | ICD-10-CM

## 2011-07-04 DIAGNOSIS — I70219 Atherosclerosis of native arteries of extremities with intermittent claudication, unspecified extremity: Secondary | ICD-10-CM

## 2011-07-04 DIAGNOSIS — I723 Aneurysm of iliac artery: Secondary | ICD-10-CM

## 2011-07-04 DIAGNOSIS — I724 Aneurysm of artery of lower extremity: Secondary | ICD-10-CM

## 2011-07-04 NOTE — Progress Notes (Signed)
ABI performed @ [facility] 07/04/2011

## 2011-07-04 NOTE — Progress Notes (Signed)
BLE arterial duplex performed @ [facility] 07/04/2011

## 2011-07-04 NOTE — Progress Notes (Signed)
[facility]  07/04/2011 DOB: [id number] MRN : [id number]  CC: back pain and bilateral leg pain with walking Referring Physician: Dr. Majel Homer  History of Present Illness: [name] who has had leg pain with walking for greater than 2 years.  He walks different distances from day to day and it cause his anterior thighs to hurt.  The pain runs down his legs to the lower legs the more he walks.  He sits to rest and it takes about 20 min. For the pain to ease off.  Tylenol seems to help some and ibuprofen helps more.  His medical doctor does not want him to take NSAIDS.  He has had a "mild stroke 3-4 years ago that slurred speech.  He takes 2 81 mg Asprin daily.  Past Medical History  Diagnosis Date  . Hypertension   . HLD (hyperlipidemia)   . Erectile dysfunction   . Chronic back pain   . CVA (cerebrovascular accident) 5/09    Rt sided  . CAD (coronary artery disease)   . Carotid artery disease   . PVD (peripheral vascular disease) with claudication     No past surgical history on file.   ROS: [x]  Positive  [ ]  Denies    General: [ ]  Weight loss, [ ]  Fever, [ ]  chills Neurologic: [ ]  Dizziness, [ ]  Blackouts, [ ]  Seizure [ ]  Stroke, [ x] "Mini stroke", [x ] Slurred speech, [ ]  Temporary blindness; [ ]  weakness in arms or legs, [ ]  Hoarseness Cardiac: x[ ]  Chest pain/pressure, [ ]  Shortness of breath at rest [x ] Shortness of breath with exertion, [ ]  Atrial fibrillation or irregular heartbeat Vascular: [x ] Pain in legs with walking, [ ]  Pain in legs at rest, [ ]  Pain in legs at night,  [ ]  Non-healing ulcer, [ ]  Blood clot in vein/DVT,   Pulmonary: [ ]  Home oxygen, [ ]  Productive cough, [ ]  Coughing up blood, [ ]  Asthma,  [ ]  Wheezing Musculoskeletal:  [ ]  Arthritis, [ x] Low back pain, [ ]  Joint pain Hematologic: [ ]  Easy Bruising, [ ]  Anemia; [ ]  Hepatitis Gastrointestinal: [ ]  Blood in stool, [ ]  Gastroesophageal Reflux/heartburn, [ ]   Trouble swallowing Urinary: [ ]  chronic Kidney disease, [ ]  on HD - [ ]  MWF or [ ]  TTHS, [ ]  Burning with urination, [x ] Difficulty urinating Skin: [ ]  Rashes, [ ]  Wounds Psychological: [ ]  Anxiety, [ ]  Depression  Social History History  Substance Use Topics  . Smoking status: Former Smoker -- 0.5 packs/day for 40 years    Types: Cigarettes    Quit date: 01/29/2004  . Smokeless tobacco: Never Used  . Alcohol Use: No    Family History Family History  Problem Relation Age of Onset  . Lung disease Brother   . Kidney disease Brother   . Heart disease Brother     died at 4  . Heart disease Mother   . Kidney disease Mother   . Lung disease Mother     No Known Allergies  Current Outpatient Prescriptions  Medication Sig Dispense Refill  . acetaminophen (TYLENOL) 500 MG tablet Take 1,000 mg by mouth 3 (three) times daily. (2 tablets)      . amLODipine (NORVASC) 10 MG tablet Take 1 tablet (10 mg total) by mouth daily.  30 tablet  11  . aspirin 81 MG EC tablet Take 162 mg by mouth daily.       Marland Kitchen  pravastatin (PRAVACHOL) 20 MG tablet Take 1 tablet (20 mg total) by mouth daily.  90 tablet  3  . Tamsulosin HCl (FLOMAX) 0.4 MG CAPS Take 0.4 mg by mouth daily. In the AM         Imaging: No results found.  Significant Diagnostic Studies: CBC Lab Results  Component Value Date   WBC 4.1 03/30/2010   HGB 13.0 03/30/2010   HCT 39.5 03/30/2010   MCV 83.0 03/30/2010   PLT 184 03/30/2010    BMET    Component Value Date/Time   NA 139 04/09/2011 1122   K 4.2 04/09/2011 1122   CL 105 04/09/2011 1122   CO2 25 04/09/2011 1122   GLUCOSE 96 04/09/2011 1122   BUN 17 04/09/2011 1122   CREATININE 1.27 04/09/2011 1122   CREATININE 1.31 03/30/2010 2030   CALCIUM 9.1 04/09/2011 1122   GFRNONAA 47* 06/27/2007 2142   GFRAA  Value: 56        The eGFR has been calculated using the MDRD equation. This calculation has not been validated in all clinical* 06/27/2007 2142    COAG Lab Results  Component Value  Date   INR 1.0 06/23/2007   No results found for this basename: PTT     Physical Examination  @IPVITALS @ Pulse Readings from Last 3 Encounters:  07/04/11 48  05/28/11 57  04/30/11 58    General:  WDWN in NAD Gait: Normal HENT: WNL Eyes: Pupils equal Pulmonary: normal non-labored breathing , without Rales, rhonchi,  wheezing Cardiac: RRR, without  Murmurs, rubs or gallops; No carotid bruits Abdomen: soft, NT, no masses Skin: no rashes, ulcers noted.  Left anterior thigh dark skin discoloration with raised edges. Vascular Exam/Pulses:palpable femoral pulses bil.  Skin is warm and dry distally bil. Feet.  Extremities without ischemic changes, no Gangrene , no cellulitis; no open wounds;  Musculoskeletal: no muscle wasting or atrophy  Neurologic: A&O X 3; Appropriate Affect ;  SENSATION: normal; MOTOR FUNCTION:  moving all extremities equally.  Speech is slurred  Non-Invasive Vascular Imaging:  Right ankle brachial index 0.91  Left ankle brachial index 0.76  Duplex ultrasound small AAA 3.4 cm,  left iliac 2.1 cm, right iliac1.9 cm  ASSESSMENT:  PAD bilateral superficial femoral arteries. Small aneurysm in the aorta measuring.  PLAN:  F/U in six months for aorta aneurysm ultrasound And ABI'S bilateral LE.   Lianne Cure, PA-C  History and exam details as above.  His popliteal and pedal pulses are not palpable suggesting bilateral superficial femoral artery stenosis or occlusion.  The patient's ABI are not at the point that it is limb threatening, 0.9 right, 0.8 left.  I offered him arteriogram for further eval today but he is not bothered by his symptoms that much at this point.  He probably also has some component of lumbar disk disease contributing as well.  Incidentally we found he has a small AAA and iliac aneurysms which will need surveillance over time.  The AAA was 3 cm today.  The patient will have f/u ABI and AAA ultrasound in 6 months.   If he changes his mind  about agram I can schedule this at anytime.  Fabienne Bruns, MD [facility] Office: [phone] Pager: [phone]

## 2011-07-05 NOTE — Procedures (Unsigned)
LOWER EXTREMITY ARTERIAL DUPLEX  INDICATION:  Lower extremity claudication, aneurysm, peripheral vascular disease.  HISTORY: Diabetes:  No. Cardiac:  No. Hypertension:  Yes. Smoking:  Previous. Previous Surgery:  No lower extremity arterial interventions.  SINGLE LEVEL ARTERIAL EXAM                         RIGHT                LEFT Brachial: Anterior tibial: Posterior tibial: Peroneal: Ankle/Brachial Index:   0.91                 0.76  LOWER EXTREMITY ARTERIAL DUPLEX EXAM  DUPLEX: 1. Aneurysmal dilatation present involving the left common femoral     artery measuring 1.61 cm x 1.62 cm. 2. Mild diffuse heterogenous plaque noted involving the bilateral     superficial femoral arteries.  IMPRESSION: 1. Left common femoral artery aneurysmal dilatation as noted above. 2. Heterogenous plaque present bilaterally as noted above. 3. Right ankle-brachial index in the mild claudication range. 4. Left ankle-brachial index in the moderate claudication range.  ___________________________________________ Janetta Hora. Fields, MD  SH/MEDQ  D:  07/04/2011  T:  07/04/2011  Job:  [id number]

## 2011-07-05 NOTE — Progress Notes (Signed)
Addended by: Sharee Pimple on: 07/05/2011 08:45 AM   Modules accepted: Orders

## 2011-07-19 NOTE — Procedures (Unsigned)
DUPLEX ULTRASOUND OF ABDOMINAL AORTA  INDICATION:  Abdominal aortic aneurysm.  HISTORY: Diabetes:  No. Cardiac:  No. Hypertension:  Yes. Smoking:  Previous. Connective Tissue Disorder: Family History:  Unknown. Previous Surgery:  No aortic intervention.  DUPLEX EXAM:         AP (cm)                   TRANSVERSE (cm) Proximal             2.75 cm                   3.22 cm Mid                  2.55 cm                   2.55 cm Distal               3.15 cm                   3.38 cm Right Iliac          1.90 cm                   1.73 cm Left Iliac           2.18 cm                   2.22 cm  PREVIOUS:  Date:  AP:  TRANSVERSE:  IMPRESSION: 1. Abdominal aortic aneurysm present involving the mid distal segment     measuring 3.15 cm x 3.38 cm without intramural thrombus present. 2. Right common iliac artery aneurysm measuring 1.90 cm x 1.73 cm. 3. Left common iliac artery aneurysm measuring 2.18 cm x 2.22 cm. 4. Left external iliac artery distal aneurysm measuring 2.23 cm  AP.  ___________________________________________ Janetta Hora. Fields, MD  SH/MEDQ  D:  07/04/2011  T:  07/04/2011  Job:  [id number]

## 2011-09-13 ENCOUNTER — Other Ambulatory Visit: Payer: Self-pay | Admitting: Family Medicine

## 2011-09-13 NOTE — Telephone Encounter (Signed)
error 

## 2011-10-15 ENCOUNTER — Other Ambulatory Visit: Payer: Self-pay | Admitting: Family Medicine

## 2011-11-29 ENCOUNTER — Other Ambulatory Visit: Payer: Self-pay | Admitting: Family Medicine

## 2011-11-29 MED ORDER — AMLODIPINE BESYLATE 10 MG PO TABS: 10.0000 mg | ORAL_TABLET | Freq: Every day | ORAL | Status: DC

## 2011-12-05 ENCOUNTER — Ambulatory Visit: Payer: Managed Care, Other (non HMO) | Admitting: Family Medicine

## 2011-12-10 ENCOUNTER — Encounter: Payer: Self-pay | Admitting: Family Medicine

## 2011-12-10 ENCOUNTER — Ambulatory Visit (INDEPENDENT_AMBULATORY_CARE_PROVIDER_SITE_OTHER): Payer: Managed Care, Other (non HMO) | Admitting: Family Medicine

## 2011-12-10 VITALS — BP 166/102 | HR 50 | Ht 76.0 in | Wt 219.0 lb

## 2011-12-10 DIAGNOSIS — M545 Low back pain: Secondary | ICD-10-CM

## 2011-12-10 DIAGNOSIS — E785 Hyperlipidemia, unspecified: Secondary | ICD-10-CM

## 2011-12-10 DIAGNOSIS — I10 Essential (primary) hypertension: Secondary | ICD-10-CM

## 2011-12-10 LAB — CBC
Hemoglobin: 12.1 g/dL — ABNORMAL LOW (ref 13.0–17.0)
MCH: 27.9 pg (ref 26.0–34.0)
MCHC: 33.2 g/dL (ref 30.0–36.0)
MCV: 84.1 fL (ref 78.0–100.0)
RBC: 4.34 MIL/uL (ref 4.22–5.81)

## 2011-12-10 LAB — COMPREHENSIVE METABOLIC PANEL
ALT: 19 U/L (ref 0–53)
AST: 23 U/L (ref 0–37)
Albumin: 4 g/dL (ref 3.5–5.2)
Alkaline Phosphatase: 95 U/L (ref 39–117)
Calcium: 8.7 mg/dL (ref 8.4–10.5)
Chloride: 104 mEq/L (ref 96–112)
Potassium: 4 mEq/L (ref 3.5–5.3)
Sodium: 139 mEq/L (ref 135–145)
Total Protein: 6.5 g/dL (ref 6.0–8.3)

## 2011-12-10 LAB — LIPID PANEL
Cholesterol: 191 mg/dL (ref 0–200)
LDL Cholesterol: 129 mg/dL — ABNORMAL HIGH (ref 0–99)
Total CHOL/HDL Ratio: 4 Ratio
VLDL: 14 mg/dL (ref 0–40)

## 2011-12-10 MED ORDER — TRAMADOL HCL 50 MG PO TABS: 50.0000 mg | ORAL_TABLET | Freq: Three times a day (TID) | ORAL | Status: DC | PRN

## 2011-12-10 MED ORDER — AMLODIPINE BESYLATE 10 MG PO TABS: 10.0000 mg | ORAL_TABLET | Freq: Every day | ORAL | Status: DC

## 2011-12-10 NOTE — Assessment & Plan Note (Signed)
Uncontrolled currently.  Will restart meds, nurse visit in 1-2 weeks, titrate from there.  Currently no red flags on ROS.

## 2011-12-10 NOTE — Patient Instructions (Addendum)
It was good to see you today! We are checking your cholesterol today. I am giving you a one month's supply of your blood pressure medications.  Your blood pressure is still high today so we need you to come back for a nurse visit in 1-2 weeks for a blood pressure check.

## 2011-12-10 NOTE — Assessment & Plan Note (Signed)
Recheck lipids today.  Cont Pravachol.

## 2011-12-10 NOTE — Assessment & Plan Note (Signed)
Refill tramadol, rec f/u with [facility]

## 2011-12-10 NOTE — Progress Notes (Signed)
Patient ID: Brian Burns, male   DOB: Sep 12, 1945, 66 y.o.   MRN: [id number] Subjective: The patient is a 66 y.o. year old male who presents today for f/u.  1. HTN: No CP, SOB, visual changes, or leg swelling. Has not been taking his medication regularly as he has been out.  Currently uncontrolled.  2. Back/leg pain: Likely secondary to PVD with some radiculopathy.  Stable.  Has f/u with [facility] in about 1 month.  Has previously refused arterial studies.  Has previously used tramadol to some effect.  Pt has achy pain in legs with standing for extended periods of time or with walking.  No bowel/bladder problems.  3. HLD: No muscle aches or abd pain.  Patient's past medical, social, and family history were reviewed and updated as appropriate. History  Substance Use Topics  . Smoking status: Former Smoker -- 0.5 packs/day for 40 years    Types: Cigarettes    Quit date: 01/29/2004  . Smokeless tobacco: Never Used  . Alcohol Use: No   Objective:  Filed Vitals:   12/10/11 0905  BP: 166/102  Pulse: 50   Gen: NAD CV: RRR, 2/6 systolic murmur Resp: CTABL, decreased throughout Ext: 2+ pulses upper extremiteis, 1+ lower.  No edema  Assessment/Plan:  Please also see individual problems in problem list for problem-specific plans.

## 2011-12-12 ENCOUNTER — Telehealth: Payer: Self-pay | Admitting: Family Medicine

## 2011-12-12 NOTE — Telephone Encounter (Signed)
Plan to switch to Lipitor (goal LDL would certainly be <100 in him).  Will call patient again in AM.

## 2011-12-16 MED ORDER — ATORVASTATIN CALCIUM 40 MG PO TABS: 40.0000 mg | ORAL_TABLET | Freq: Every day | ORAL | Status: DC

## 2011-12-16 NOTE — Telephone Encounter (Signed)
Could you call the pharmacy and let them know to cancel the patient's pravachol rx.  We have switched to Lipitor.  Pt was called and notified of this change.

## 2012-01-01 ENCOUNTER — Ambulatory Visit (INDEPENDENT_AMBULATORY_CARE_PROVIDER_SITE_OTHER): Payer: Managed Care, Other (non HMO) | Admitting: *Deleted

## 2012-01-01 ENCOUNTER — Ambulatory Visit (INDEPENDENT_AMBULATORY_CARE_PROVIDER_SITE_OTHER): Payer: Managed Care, Other (non HMO) | Admitting: Family Medicine

## 2012-01-01 ENCOUNTER — Encounter: Payer: Self-pay | Admitting: Family Medicine

## 2012-01-01 VITALS — BP 150/96 | HR 47 | Temp 97.7°F | Wt 215.0 lb

## 2012-01-01 DIAGNOSIS — R51 Headache: Secondary | ICD-10-CM

## 2012-01-01 DIAGNOSIS — I10 Essential (primary) hypertension: Secondary | ICD-10-CM

## 2012-01-01 DIAGNOSIS — R519 Headache, unspecified: Secondary | ICD-10-CM | POA: Insufficient documentation

## 2012-01-01 MED ORDER — KETOROLAC TROMETHAMINE 60 MG/2ML IM SOLN: 30.0000 mg | Freq: Once | INTRAMUSCULAR | Status: DC

## 2012-01-01 MED ORDER — LISINOPRIL 10 MG PO TABS: 10.0000 mg | ORAL_TABLET | Freq: Every day | ORAL | Status: DC

## 2012-01-01 NOTE — Patient Instructions (Addendum)
Your headache may be from high blood pressure or tension. Please start blood pressure medication lisinopril daily. You can use tylenol 650 mg for pain at home. Make appointment for check up on Friday. If you are still having headache then, may need additional testing. If you have slurred speech, weakness, numbness or worsened symptoms then go to the ER.

## 2012-01-01 NOTE — Progress Notes (Signed)
Patient in for BP check. States he has had a headache for a week. Also much pain in back and legs. Has been taking BP medication as directed. Before last visit he had taking medication off and on.    BP checked manually using regular adult cuff. BP LA 150/102 and RA 150/96. Pulse 47.   Appointment scheduled for work in appointment now due to headache and elevated BP.

## 2012-01-01 NOTE — Assessment & Plan Note (Addendum)
Subacute headache that waxes and wanes for past 4 days. Possible red flag would be worse with exertion. His BP is not significantly elevated compared with recent values, so do not suspect hypertensive urgency, no neuro signs of ICH, no temporal TTP to suggest temporal arteritis. This is a new type headache, but he has not tried any medications. Considered neuroimaging, but will defer for trial of analgesia in absence of concerning exam findings and well-appearance today. We discussed trial of toradol injection and tylenol for abortive therapy. Advised to f/u in clinic with PCP in 2 days for re-evaluation. If fails to respond or new neurologic findings or other concerns would have low threshold for neuro imaging. Advised patient to seek emergency care for worsening or neuro signs.  Case d/w Dr. Earnest Bailey.

## 2012-01-01 NOTE — Progress Notes (Signed)
  Subjective:    Patient ID: Brian Burns, male    DOB: November 11, 1945, 66 y.o.   MRN: [id number]  HPI  1. Headache. Patient was in clinic for a nurse BP check and he is reporting a 4-5 day headache. He was worked in to clinic today. He describes gradual onset of severe bilateral throbbing headache 4 nights ago while he was at work. Rated between 4 and 9/10. It improves with lying down and sleeping. Returns again upon getting out of bed and doing daily activities. Describes a waxing and waning blurred vision but no new neurologic changes such as weakness, numbness, speech difficulties or facial changes. No head trauma, falls or history of headaches previously.His BP is uncontrolled recently with last visit being 160/102. Has stopped taking losartan and HCTZ previously due to perceived side effects according to PCP notes.   Patient has hx of CVA in 2009 with slight residual dysarthria.   Review of Systems Has chronic leg pain with ambulation and LBP.  No photophobia or phonophobia, nausea, emesis, fevers. Denies chest pain, dyspnea, edema, dysphagia, facial pain, rhinorrhea, productive cough, fever, chills, eye pain, neck pain/stiffness.     Objective:   Physical Exam  Vitals reviewed. Constitutional: He is oriented to person, place, and time. He appears well-developed and well-nourished. No distress.       Alert and makes many hand gestures, does not appear to be in pain.  HENT:  Head: Normocephalic and atraumatic.  Right Ear: External ear normal.  Left Ear: External ear normal.  Nose: Nose normal.  Mouth/Throat: Oropharynx is clear and moist. No oropharyngeal exudate.       No sinus or temporal TTP.  Eyes: Conjunctivae normal and EOM are normal. Pupils are equal, round, and reactive to light. Right eye exhibits no discharge. Left eye exhibits no discharge. No scleral icterus.       Fundic exam-no lesions detected.  Neck: Normal range of motion. Neck supple. No thyromegaly present.   Cardiovascular: Normal rate, regular rhythm and normal heart sounds.   No murmur heard. Pulmonary/Chest: Effort normal and breath sounds normal. No respiratory distress. He has no wheezes.  Musculoskeletal: He exhibits no edema and no tenderness.  Lymphadenopathy:    He has no cervical adenopathy.  Neurological: He is alert and oriented to person, place, and time. No cranial nerve deficit. He exhibits normal muscle tone. Coordination normal.       CN 2-12 intact. Normal coordination finger-nose. Negative romberg.  Skin: No rash noted. He is not diaphoretic.  Psychiatric: He has a normal mood and affect.        Assessment & Plan:

## 2012-01-01 NOTE — Assessment & Plan Note (Signed)
Remains above goal. Do not think I can blame HTN for new headache since this is chronically elevated. Looks like had a ?intolerance to ARB previously. Will start low dose lisinopril and f/u with PCP for BMET.

## 2012-01-03 ENCOUNTER — Ambulatory Visit: Payer: Managed Care, Other (non HMO) | Admitting: Family Medicine

## 2012-01-06 ENCOUNTER — Emergency Department (HOSPITAL_COMMUNITY): Payer: Managed Care, Other (non HMO)

## 2012-01-06 ENCOUNTER — Encounter (HOSPITAL_COMMUNITY): Payer: Self-pay | Admitting: Emergency Medicine

## 2012-01-06 ENCOUNTER — Observation Stay (HOSPITAL_COMMUNITY)
Admission: EM | Admit: 2012-01-06 | Discharge: 2012-01-08 | Disposition: A | Discharge status: Home / Self Care | Payer: Managed Care, Other (non HMO) | Attending: Family Medicine | Admitting: Family Medicine

## 2012-01-06 DIAGNOSIS — I639 Cerebral infarction, unspecified: Secondary | ICD-10-CM

## 2012-01-06 DIAGNOSIS — Z79899 Other long term (current) drug therapy: Secondary | ICD-10-CM | POA: Insufficient documentation

## 2012-01-06 DIAGNOSIS — R4789 Other speech disturbances: Secondary | ICD-10-CM | POA: Insufficient documentation

## 2012-01-06 DIAGNOSIS — R001 Bradycardia, unspecified: Secondary | ICD-10-CM

## 2012-01-06 DIAGNOSIS — I6350 Cerebral infarction due to unspecified occlusion or stenosis of unspecified cerebral artery: Principal | ICD-10-CM | POA: Insufficient documentation

## 2012-01-06 DIAGNOSIS — Z7902 Long term (current) use of antithrombotics/antiplatelets: Secondary | ICD-10-CM | POA: Insufficient documentation

## 2012-01-06 DIAGNOSIS — I519 Heart disease, unspecified: Secondary | ICD-10-CM | POA: Insufficient documentation

## 2012-01-06 DIAGNOSIS — Z7982 Long term (current) use of aspirin: Secondary | ICD-10-CM | POA: Insufficient documentation

## 2012-01-06 DIAGNOSIS — G459 Transient cerebral ischemic attack, unspecified: Secondary | ICD-10-CM

## 2012-01-06 DIAGNOSIS — I2789 Other specified pulmonary heart diseases: Secondary | ICD-10-CM | POA: Insufficient documentation

## 2012-01-06 DIAGNOSIS — E785 Hyperlipidemia, unspecified: Secondary | ICD-10-CM | POA: Insufficient documentation

## 2012-01-06 DIAGNOSIS — I498 Other specified cardiac arrhythmias: Secondary | ICD-10-CM | POA: Insufficient documentation

## 2012-01-06 DIAGNOSIS — I10 Essential (primary) hypertension: Secondary | ICD-10-CM

## 2012-01-06 DIAGNOSIS — I359 Nonrheumatic aortic valve disorder, unspecified: Secondary | ICD-10-CM | POA: Insufficient documentation

## 2012-01-06 DIAGNOSIS — Z87891 Personal history of nicotine dependence: Secondary | ICD-10-CM | POA: Insufficient documentation

## 2012-01-06 DIAGNOSIS — I2510 Atherosclerotic heart disease of native coronary artery without angina pectoris: Secondary | ICD-10-CM | POA: Insufficient documentation

## 2012-01-06 LAB — URINALYSIS, ROUTINE W REFLEX MICROSCOPIC
Glucose, UA: NEGATIVE mg/dL
Ketones, ur: NEGATIVE mg/dL
Leukocytes, UA: NEGATIVE
Protein, ur: 30 mg/dL — AB
pH: 6 (ref 5.0–8.0)

## 2012-01-06 LAB — COMPREHENSIVE METABOLIC PANEL
ALT: 14 U/L (ref 0–53)
Albumin: 3.5 g/dL (ref 3.5–5.2)
Alkaline Phosphatase: 113 U/L (ref 39–117)
BUN: 16 mg/dL (ref 6–23)
Chloride: 107 mEq/L (ref 96–112)
Glucose, Bld: 110 mg/dL — ABNORMAL HIGH (ref 70–99)
Potassium: 3.9 mEq/L (ref 3.5–5.1)
Sodium: 138 mEq/L (ref 135–145)
Total Bilirubin: 0.2 mg/dL — ABNORMAL LOW (ref 0.3–1.2)
Total Protein: 6.7 g/dL (ref 6.0–8.3)

## 2012-01-06 LAB — CBC WITH DIFFERENTIAL/PLATELET
Basophils Relative: 0 % (ref 0–1)
Hemoglobin: 11.9 g/dL — ABNORMAL LOW (ref 13.0–17.0)
Lymphs Abs: 1.9 10*3/uL (ref 0.7–4.0)
Monocytes Relative: 7 % (ref 3–12)
Neutro Abs: 2.4 10*3/uL (ref 1.7–7.7)
Neutrophils Relative %: 51 % (ref 43–77)
Platelets: 159 10*3/uL (ref 150–400)
RBC: 4.19 MIL/uL — ABNORMAL LOW (ref 4.22–5.81)
WBC: 4.7 10*3/uL (ref 4.0–10.5)

## 2012-01-06 LAB — URINE MICROSCOPIC-ADD ON

## 2012-01-06 NOTE — ED Notes (Addendum)
PT. PRESENTS WITH SLURRED SPEECH / HEADACHE ONSET YESTERDAY , BRADYCARDIC = 47/MIN. EQUAL GRIPS , NO ARM DRIFT , SLIGHT RIGHT FACIAL ASYMMETRY. ALERT AND ORIENTED.  CT SCAN ORDERED BY DR. NANAVATI AT TRIAGE.

## 2012-01-06 NOTE — ED Notes (Signed)
Dr Effie Shy notified of heart rate of 34 and bp within normal limits, no further orders given at this time.

## 2012-01-06 NOTE — ED Provider Notes (Signed)
History     CSN: [id number]  Arrival date & time 01/06/12  1955   First MD Initiated Contact with Patient 01/06/12 2210      Chief Complaint  Patient presents with  . Aphasia    (Consider location/radiation/quality/duration/timing/severity/associated sxs/prior treatment) HPI Comments: Brian Burns is a 66 y.o. Male who is here for evaluation of slurred speech. The slurred speech began yesterday. He had a headache 2 days ago, that has resolved. He may have some mild weakness of the left foot. He is able to walk. He's able to eat. He has not choked on any food or liquid. He is taking his medications as usual. These include aspirin 162 mg each day. He has history of "mini stroke" . He has slurred speech, at baseline, but his slurred speech, is worse now, according to family members. There are no known modifying factors. He denies recent fever, chills, nausea, vomiting, paresthesias, or dizziness.  He was seen by his PCP 5 days ago, and started on a new blood pressure medicine, lisinopril, and hydrochlorothiazide. His last stroke evaluation was in 2009 at the time of an acute stroke.  The history is provided by the patient and a relative.    Past Medical History  Diagnosis Date  . Hypertension   . HLD (hyperlipidemia)   . Erectile dysfunction   . Chronic back pain   . CVA (cerebrovascular accident) 5/09    Rt sided  . CAD (coronary artery disease)   . Carotid artery disease   . PVD (peripheral vascular disease) with claudication     History reviewed. No pertinent past surgical history.  Family History  Problem Relation Age of Onset  . Lung disease Brother   . Kidney disease Brother   . Heart disease Brother     died at 66  . Heart disease Mother   . Kidney disease Mother   . Lung disease Mother     History  Substance Use Topics  . Smoking status: Former Smoker -- 0.5 packs/day for 40 years    Types: Cigarettes    Quit date: 01/29/2004  . Smokeless tobacco: Never  Used  . Alcohol Use: No      Review of Systems  All other systems reviewed and are negative.    Allergies  Novocain  Home Medications   Current Outpatient Rx  Name  Route  Sig  Dispense  Refill  . ACETAMINOPHEN 500 MG PO TABS   Oral   Take 1,000 mg by mouth 3 (three) times daily. (2 tablets)         . AMLODIPINE BESYLATE 10 MG PO TABS   Oral   Take 1 tablet (10 mg total) by mouth daily.   30 tablet   1   . ASPIRIN 81 MG PO TBEC   Oral   Take 162 mg by mouth daily.          . ATORVASTATIN CALCIUM 40 MG PO TABS   Oral   Take 1 tablet (40 mg total) by mouth daily.   90 tablet   1   . LISINOPRIL 10 MG PO TABS   Oral   Take 1 tablet (10 mg total) by mouth daily.   30 tablet   3   . TAMSULOSIN HCL 0.4 MG PO CAPS   Oral   Take 0.4 mg by mouth daily. In the AM         . TRAMADOL HCL 50 MG PO TABS   Oral  Take 1 tablet (50 mg total) by mouth every 8 (eight) hours as needed for pain.   90 tablet   1     Will require office appointment for any further re ...     BP 138/71  Pulse 46  Temp 98.1 F (36.7 C) (Oral)  Resp 17  SpO2 97%  Physical Exam  Nursing note and vitals reviewed. Constitutional: He appears well-developed and well-nourished.  HENT:  Head: Normocephalic and atraumatic.  Right Ear: External ear normal.  Left Ear: External ear normal.  Eyes: Conjunctivae normal and EOM are normal. Pupils are equal, round, and reactive to light.  Neck: Normal range of motion and phonation normal. Neck supple.  Cardiovascular: Normal rate, regular rhythm, normal heart sounds and intact distal pulses.   Pulmonary/Chest: Effort normal and breath sounds normal. He exhibits no bony tenderness.  Abdominal: Soft. Normal appearance. There is no tenderness.  Musculoskeletal: Normal range of motion.       Strength 5 out of 5 in the arms, and legs, bilaterally. No truncal ataxia with sitting on stretcher  Neurological: He is alert. He has normal strength. No  cranial nerve deficit or sensory deficit. He exhibits normal muscle tone. Coordination normal.       Dysarthria. No expressive or receptive aphasia. Normal finger to nose, and heel-to-shin bilaterally  Skin: Skin is warm, dry and intact.  Psychiatric: He has a normal mood and affect. His behavior is normal. Judgment and thought content normal.    ED Course  Procedures (including critical care time)      Date: 11/15/2011  Rate: 47  Rhythm: sinus bradycardia  QRS Axis: normal  PR and QT Intervals: normal  ST/T Wave abnormalities: normal  PR and QRS Conduction Disutrbances:none  Narrative Interpretation:   Old EKG Reviewed: changes noted- rate slower    '  Labs Reviewed  CBC WITH DIFFERENTIAL - Abnormal; Notable for the following:    RBC 4.19 (*)     Hemoglobin 11.9 (*)     HCT 35.5 (*)     All other components within normal limits  COMPREHENSIVE METABOLIC PANEL - Abnormal; Notable for the following:    Glucose, Bld 110 (*)     Total Bilirubin 0.2 (*)     GFR calc non Af Amer 72 (*)     GFR calc Af Amer 84 (*)     All other components within normal limits  URINALYSIS, ROUTINE W REFLEX MICROSCOPIC - Abnormal; Notable for the following:    Specific Gravity, Urine 1.031 (*)     Bilirubin Urine SMALL (*)     Protein, ur 30 (*)     Urobilinogen, UA 2.0 (*)     All other components within normal limits  URINE MICROSCOPIC-ADD ON - Abnormal; Notable for the following:    Squamous Epithelial / LPF FEW (*)     Bacteria, UA FEW (*)     All other components within normal limits   Ct Head Wo Contrast  01/06/2012  *RADIOLOGY REPORT*  Clinical Data: Slurred speech and difficulty putting sentences together.  CT HEAD WITHOUT CONTRAST  Technique:  Contiguous axial images were obtained from the base of the skull through the vertex without contrast.  Comparison: 06/22/2007  Findings: Ventricular dilatation consistent with central atrophy. Patchy low attenuation changes in the deep white  matter consistent with small vessel ischemia.  Old lacunar infarct in the right basal ganglia.  Focal low attenuation area in the right cerebellum likely represents old infarct.  This is stable since previous study.  No mass effect or midline shift.  No abnormal extra-axial fluid collections.  Gray-white matter junctions appear distinct.  Basal cisterns are not effaced.  No evidence of acute intracranial hemorrhage.  Prominent vascular calcifications.  No depressed skull fractures.  Visualized paranasal sinuses and mastoid air cells are not opacified.  IMPRESSION: No acute intracranial abnormalities.  Mild atrophy and small vessel ischemic changes.  No significant change since previous study.   Original Report Authenticated By: Burman Nieves, M.D.    Nursing notes, applicable records and vitals reviewed.  Radiologic Images/Reports reviewed.   1. CVA (cerebral infarction)   2. Bradycardia       MDM  Evaluation consistent with acute stroke. CT is negative. He has chronic old small vessel disease and history of prior stroke.  Patient needs comprehensive stroke evaluation with consideration of new treatments to prevent further stroke. Bradycardia has been present on prior evaluations, when the patient was in clinic.  Plan: Admit- FPTS    Flint Melter, MD 01/06/12 [phone]

## 2012-01-07 ENCOUNTER — Observation Stay (HOSPITAL_COMMUNITY): Payer: Managed Care, Other (non HMO)

## 2012-01-07 DIAGNOSIS — G459 Transient cerebral ischemic attack, unspecified: Secondary | ICD-10-CM

## 2012-01-07 DIAGNOSIS — I498 Other specified cardiac arrhythmias: Secondary | ICD-10-CM

## 2012-01-07 DIAGNOSIS — I10 Essential (primary) hypertension: Secondary | ICD-10-CM

## 2012-01-07 HISTORY — PX: TRANSTHORACIC ECHOCARDIOGRAM: SHX275

## 2012-01-07 LAB — CBC
HCT: 33.9 % — ABNORMAL LOW (ref 39.0–52.0)
MCH: 28.3 pg (ref 26.0–34.0)
MCHC: 33.3 g/dL (ref 30.0–36.0)
MCV: 84.8 fL (ref 78.0–100.0)
RDW: 14.3 % (ref 11.5–15.5)

## 2012-01-07 MED ORDER — AMLODIPINE BESYLATE 10 MG PO TABS
10.0000 mg | ORAL_TABLET | Freq: Every day | ORAL | Status: DC
  Administered 2012-01-07 – 2012-01-08 (×2): 10 mg via ORAL
  Filled 2012-01-07 (×2): qty 1

## 2012-01-07 MED ORDER — ASPIRIN-DIPYRIDAMOLE ER 25-200 MG PO CP12: 1.0000 | ORAL_CAPSULE | Freq: Two times a day (BID) | ORAL | Status: DC

## 2012-01-07 MED ORDER — ASPIRIN-DIPYRIDAMOLE ER 25-200 MG PO CP12
1.0000 | ORAL_CAPSULE | Freq: Every day | ORAL | Status: DC
  Administered 2012-01-07: 1 via ORAL
  Filled 2012-01-07: qty 1

## 2012-01-07 MED ORDER — LISINOPRIL 10 MG PO TABS
10.0000 mg | ORAL_TABLET | Freq: Every day | ORAL | Status: DC
  Administered 2012-01-07 – 2012-01-08 (×2): 10 mg via ORAL
  Filled 2012-01-07 (×2): qty 1

## 2012-01-07 MED ORDER — ASPIRIN-DIPYRIDAMOLE ER 25-200 MG PO CP12
1.0000 | ORAL_CAPSULE | Freq: Two times a day (BID) | ORAL | Status: DC
  Administered 2012-01-07 – 2012-01-08 (×2): 1 via ORAL
  Filled 2012-01-07 (×4): qty 1

## 2012-01-07 MED ORDER — ASPIRIN EC 81 MG PO TBEC
81.0000 mg | DELAYED_RELEASE_TABLET | Freq: Every day | ORAL | Status: DC
  Filled 2012-01-07: qty 1

## 2012-01-07 MED ORDER — ATORVASTATIN CALCIUM 40 MG PO TABS
40.0000 mg | ORAL_TABLET | Freq: Every day | ORAL | Status: DC
  Administered 2012-01-07 – 2012-01-08 (×2): 40 mg via ORAL
  Filled 2012-01-07 (×2): qty 1

## 2012-01-07 MED ORDER — ONDANSETRON HCL 4 MG/2ML IJ SOLN: 4.0000 mg | Freq: Four times a day (QID) | INTRAMUSCULAR | Status: DC | PRN

## 2012-01-07 MED ORDER — SENNOSIDES-DOCUSATE SODIUM 8.6-50 MG PO TABS: 1.0000 | ORAL_TABLET | Freq: Every evening | ORAL | Status: DC | PRN

## 2012-01-07 MED ORDER — LORAZEPAM 2 MG/ML IJ SOLN
1.0000 mg | Freq: Once | INTRAMUSCULAR | Status: AC
  Administered 2012-01-08: 1 mg via INTRAVENOUS
  Filled 2012-01-07 (×2): qty 1

## 2012-01-07 MED ORDER — TAMSULOSIN HCL 0.4 MG PO CAPS
0.4000 mg | ORAL_CAPSULE | Freq: Every day | ORAL | Status: DC
  Administered 2012-01-07 – 2012-01-08 (×2): 0.4 mg via ORAL
  Filled 2012-01-07 (×2): qty 1

## 2012-01-07 MED ORDER — HEPARIN SODIUM (PORCINE) 5000 UNIT/ML IJ SOLN
5000.0000 [IU] | Freq: Three times a day (TID) | INTRAMUSCULAR | Status: DC
  Administered 2012-01-07 – 2012-01-08 (×5): 5000 [IU] via SUBCUTANEOUS
  Filled 2012-01-07 (×7): qty 1

## 2012-01-07 MED ORDER — ACETAMINOPHEN 325 MG PO TABS: 650.0000 mg | ORAL_TABLET | Freq: Four times a day (QID) | ORAL | Status: DC | PRN

## 2012-01-07 MED ORDER — ACETAMINOPHEN 325 MG PO TABS: 650.0000 mg | ORAL_TABLET | ORAL | Status: DC | PRN

## 2012-01-07 MED ORDER — SODIUM CHLORIDE 0.9 % IV SOLN
INTRAVENOUS | Status: DC
  Administered 2012-01-07: 1000 mL via INTRAVENOUS

## 2012-01-07 MED ORDER — ACETAMINOPHEN 650 MG RE SUPP: 650.0000 mg | RECTAL | Status: DC | PRN

## 2012-01-07 NOTE — ED Notes (Signed)
Patient transported to X-ray 

## 2012-01-07 NOTE — Consult Note (Signed)
Referring Physician: Sheffield Slider    Chief Complaint: Slurred speech  HPI: Brian Burns is an 66 y.o. male with a history of stroke that reports that he noticed difficulty with his speech on the morning of the 7th.  He reports that he was hopeful that it would go away.  Wife seemed to notice it on the 8th.  Felt it was worsening on yesterday and brought the patient on for evaluation.  The patient reports that he has some baseline right sided weakness form his previous stroke and has not noted any new weakness or numbness.     LSN: 01/04/2012 tPA Given: No: Outside time window  Past Medical History  Diagnosis Date  . Hypertension   . HLD (hyperlipidemia)   . Erectile dysfunction   . Chronic back pain   . CVA (cerebrovascular accident) 5/09    Rt sided  . CAD (coronary artery disease)   . Carotid artery disease   . PVD (peripheral vascular disease) with claudication     History reviewed. No pertinent past surgical history.  Family History  Problem Relation Age of Onset  . Lung disease Brother   . Kidney disease Brother   . Heart disease Brother     died at 59  . Heart disease Mother   . Kidney disease Mother   . Lung disease Mother    Social History:  reports that he quit smoking about 7 years ago. His smoking use included Cigarettes. He has a 20 pack-year smoking history. He has never used smokeless tobacco. He reports that he does not drink alcohol or use illicit drugs.  Allergies:  Allergies  Allergen Reactions  . Novocain (Procaine Hcl)     Medications:  I have reviewed the patient's current medications. Prior to Admission:  Prescriptions prior to admission  Medication Sig Dispense Refill  . acetaminophen (TYLENOL) 500 MG tablet Take 1,000 mg by mouth 3 (three) times daily. (2 tablets)      . amLODipine (NORVASC) 10 MG tablet Take 1 tablet (10 mg total) by mouth daily.  30 tablet  1  . aspirin 81 MG EC tablet Take 162 mg by mouth daily.       Marland Kitchen atorvastatin (LIPITOR) 40  MG tablet Take 1 tablet (40 mg total) by mouth daily.  90 tablet  1  . lisinopril (PRINIVIL,ZESTRIL) 10 MG tablet Take 1 tablet (10 mg total) by mouth daily.  30 tablet  3  . Tamsulosin HCl (FLOMAX) 0.4 MG CAPS Take 0.4 mg by mouth daily. In the AM      . traMADol (ULTRAM) 50 MG tablet Take 1 tablet (50 mg total) by mouth every 8 (eight) hours as needed for pain.  90 tablet  1   Scheduled:   . amLODipine  10 mg Oral Daily  . atorvastatin  40 mg Oral Daily  . dipyridamole-aspirin  1 capsule Oral Daily  . heparin  5,000 Units Subcutaneous Q8H  . lisinopril  10 mg Oral Daily  . Tamsulosin HCl  0.4 mg Oral Daily  . [DISCONTINUED] aspirin EC  81 mg Oral Daily    ROS: History obtained from the patient  General ROS: negative for - chills, fatigue, fever, night sweats, weight gain or weight loss Psychological ROS: negative for - behavioral disorder, hallucinations, memory difficulties, mood swings or suicidal ideation Ophthalmic ROS: negative for - blurry vision, double vision, eye pain or loss of vision ENT ROS: negative for - epistaxis, nasal discharge, oral lesions, sore throat, tinnitus or  vertigo Allergy and Immunology ROS: negative for - hives or itchy/watery eyes Hematological and Lymphatic ROS: negative for - bleeding problems, bruising or swollen lymph nodes Endocrine ROS: negative for - galactorrhea, hair pattern changes, polydipsia/polyuria or temperature intolerance Respiratory ROS: negative for - cough, hemoptysis, shortness of breath or wheezing Cardiovascular ROS: negative for - chest pain, dyspnea on exertion, edema or irregular heartbeat Gastrointestinal ROS: negative for - abdominal pain, diarrhea, hematemesis, nausea/vomiting or stool incontinence Genito-Urinary ROS: negative for - dysuria, hematuria, incontinence or urinary frequency/urgency Musculoskeletal ROS: negative for - joint swelling or muscular weakness Neurological ROS: as noted in HPI Dermatological ROS:  negative for rash and skin lesion changes  Physical Examination: Blood pressure 162/77, pulse 55, temperature 97.7 F (36.5 C), temperature source Oral, resp. rate 18, height 6\' 4"  (1.93 m), weight 99.2 kg (218 lb 11.1 oz), SpO2 98.00%.  Neurologic Examination: Mental Status: Alert, oriented, thought content appropriate.  Speech fluent but dysarthric.  Able to follow 3 step commands without difficulty. Cranial Nerves: II: Discs flat bilaterally; Visual fields grossly normal, pupils equal, round, reactive to light and accommodation III,IV, VI: ptosis not present, extra-ocular motions intact bilaterally V,VII: right facial asymmetry, facial light touch sensation normal bilaterally VIII: hearing normal bilaterally IX,X: gag reflex present XI: bilateral shoulder shrug XII: midline tongue extension Motor: Right : Upper extremity   5-/5 W/RUE pronator drift   Left:     Upper extremity   5/5  Lower extremity   5-/5       Lower extremity   5/5 Mildly increased tone on the right Sensory: Pinprick and light touch intact throughout, bilaterally Deep Tendon Reflexes: 2+ in the upper extremities and the right KJ, 1+ at the left KJ.  Absent AJ's bilaterally Plantars: Right: upgoing   Left: downgoing Cerebellar: normal finger-to-nose and normal heel-to-shin test on the left.  Mild dysmetria on the RUE felt secondary to weakness Gait: not tested CV: pulses palpable throughout     Laboratory Studies:  Basic Metabolic Panel:  Lab 01/06/12 1610  NA 138  K 3.9  CL 107  CO2 23  GLUCOSE 110*  BUN 16  CREATININE 1.06  CALCIUM 8.6  MG --  PHOS --    Liver Function Tests:  Lab 01/06/12 2012  AST 20  ALT 14  ALKPHOS 113  BILITOT 0.2*  PROT 6.7  ALBUMIN 3.5   No results found for this basename: LIPASE:5,AMYLASE:5 in the last 168 hours No results found for this basename: AMMONIA:3 in the last 168 hours  CBC:  Lab 01/07/12 0630 01/06/12 2012  WBC 4.5 4.7  NEUTROABS -- 2.4  HGB  11.3* 11.9*  HCT 33.9* 35.5*  MCV 84.8 84.7  PLT 141* 159    Cardiac Enzymes: No results found for this basename: CKTOTAL:5,CKMB:5,CKMBINDEX:5,TROPONINI:5 in the last 168 hours  BNP: No components found with this basename: POCBNP:5  CBG: No results found for this basename: GLUCAP:5 in the last 168 hours  Microbiology: Results for orders placed during the hospital encounter of 06/27/07  URINE CULTURE     Status: Normal   Collection Time   06/27/07  9:11 PM      Component Value Range Status Comment   Specimen Description URINE, RANDOM   Final    Special Requests NONE   Final    Colony Count NO GROWTH   Final    Culture NO GROWTH   Final    Report Status 06/29/2007 FINAL   Final     Coagulation Studies: No  results found for this basename: LABPROT:5,INR:5 in the last 72 hours  Urinalysis:  Lab 01/06/12 2246  COLORURINE YELLOW  LABSPEC 1.031*  PHURINE 6.0  GLUCOSEU NEGATIVE  HGBUR NEGATIVE  BILIRUBINUR SMALL*  KETONESUR NEGATIVE  PROTEINUR 30*  UROBILINOGEN 2.0*  NITRITE NEGATIVE  LEUKOCYTESUR NEGATIVE    Lipid Panel:    Component Value Date/Time   CHOL 191 12/10/2011 0952   TRIG 68 12/10/2011 0952   HDL 48 12/10/2011 0952   CHOLHDL 4.0 12/10/2011 0952   VLDL 14 12/10/2011 0952   LDLCALC 129* 12/10/2011 0952    HgbA1C:  No results found for this basename: HGBA1C    Urine Drug Screen:     Component Value Date/Time   LABOPIA NONE DETECTED 06/22/2007 1933   COCAINSCRNUR NONE DETECTED 06/22/2007 1933   LABBENZ NONE DETECTED 06/22/2007 1933   AMPHETMU NONE DETECTED 06/22/2007 1933   THCU NONE DETECTED 06/22/2007 1933   LABBARB  Value: NONE DETECTED        DRUG SCREEN FOR MEDICAL PURPOSES ONLY.  IF CONFIRMATION IS NEEDED FOR ANY PURPOSE, NOTIFY LAB WITHIN 5 DAYS. 06/22/2007 1933    Alcohol Level: No results found for this basename: ETH:2 in the last 168 hours   Imaging: Dg Chest 2 View  01/07/2012  *RADIOLOGY REPORT*  Clinical Data: Cough and stroke.  CHEST -  2 VIEW  Comparison: 05/01/2009  Findings: The heart size and pulmonary vascularity are normal. The lungs appear clear and expanded without focal air space disease or consolidation. No blunting of the costophrenic angles.  No pneumothorax.  Mediastinal contours appear intact.  Calcification of the aorta.  No significant change since previous study.  IMPRESSION: No evidence of active pulmonary disease.   Original Report Authenticated By: Burman Nieves, M.D.    Ct Head Wo Contrast  01/06/2012  *RADIOLOGY REPORT*  Clinical Data: Slurred speech and difficulty putting sentences together.  CT HEAD WITHOUT CONTRAST  Technique:  Contiguous axial images were obtained from the base of the skull through the vertex without contrast.  Comparison: 06/22/2007  Findings: Ventricular dilatation consistent with central atrophy. Patchy low attenuation changes in the deep white matter consistent with small vessel ischemia.  Old lacunar infarct in the right basal ganglia.  Focal low attenuation area in the right cerebellum likely represents old infarct.  This is stable since previous study.  No mass effect or midline shift.  No abnormal extra-axial fluid collections.  Gray-white matter junctions appear distinct.  Basal cisterns are not effaced.  No evidence of acute intracranial hemorrhage.  Prominent vascular calcifications.  No depressed skull fractures.  Visualized paranasal sinuses and mastoid air cells are not opacified.  IMPRESSION: No acute intracranial abnormalities.  Mild atrophy and small vessel ischemic changes.  No significant change since previous study.   Original Report Authenticated By: Burman Nieves, M.D.     Assessment: 66 y.o. male presenting with slurred speech.  Patient with multiple small vessel vascular risk factors-stroke in the past.  Suspect another ischemic event.  Carotid dopplers show no significant stenosis.  Echo results are pending.  CT of the head showed no acute changes.  Lipid panel showed a  LDL of 129 in November. A1c pending.  Stroke Risk Factors - hyperlipidemia, hypertension, CAD and PVD  Plan: 1. MRI, MRA  of the brain without contrast patient claustrophobic and will likely require sedation. 2. Speech consult 3. Prophylactic therapy-Antiplatelet med: Plavix - dose 75mg  daily 4. Risk factor modification 5. Telemetry monitoring 6. Frequent neuro checks  Thana Farr, MD [facility] [phone] 01/07/2012, 10:58 AM

## 2012-01-07 NOTE — H&P (Signed)
I examined this patient and discussed the care plan with Dr Mikel Cella and the [facility] team and agree with assessment and plan as documented in the admission note above. He wasn't able to tolerate the MRI due to claustrophobia so the neurologist, Dr Thad Ranger plans to sedate him for a retry. He became tearful during my interview, but denies feeling sad before this CVA. This may be emotional release, but there is evidence that antidepressants help patients rehab after stroke so that should be considered later.

## 2012-01-07 NOTE — Evaluation (Signed)
Physical Therapy Evaluation Patient Details Name: Brian Burns MRN: [id number] DOB: Dec 23, 1945 Today's Date: 01/07/2012 Time: 1151-1205 PT Time Calculation (min): 14 min  PT Assessment / Plan / Recommendation Clinical Impression  Pt is a 66 y.o. male who presents for work up of possible stroke. Pt demonstrates some changes in functional mobility compared to a week ago. Pt presents with good overall gross strength in bilateral lower extremities however, during ambulation pt demonstrates signs of RLE weakness which he states are new over the last week since he doctors appointment last tuesday.  Pt presents with deficits in functional mobility secondary to instability and weakness. Rec use of rw and will continue to see acutely to maximize dependence for discharge.    PT Assessment  Patient needs continued PT services    Follow Up Recommendations  Outpatient PT; Home health PT; Other (comment) (pending progress)       Barriers to Discharge Decreased caregiver support      Equipment Recommendations   (recommendation pending at this time)    Recommendations for Other Services     Frequency Min 4X/week    Precautions / Restrictions     Pertinent Vitals/Pain 8/10 in back       Mobility  Bed Mobility Bed Mobility: Rolling Left Rolling Left: 4: Min assist Transfers Transfers: Sit to Stand;Stand to Sit Sit to Stand: 4: Min assist Stand to Sit: 4: Min assist Ambulation/Gait Ambulation/Gait Assistance: 4: Min guard;4: Min Environmental consultant (Feet): 90 Feet Assistive device: 1 person hand held assist Ambulation/Gait Assistance Details: Pt unsteady with ambulation; multiple balance checks but able to self correct, may benefit from use of rw Gait Pattern: Step-through pattern;Decreased stride length;Right foot flat;Trunk flexed;Narrow base of support Gait velocity: decreased General Gait Details: pt shows mild rt sided wkness in RLE; occassion catching of right foot with  shuffle; pt reports this he has noticed is new since last week. Patient also presents with a RLE drag during ambulation which again is new since last week. Pain reported in LE which pt states has existed chronically in association with LBP. Modified Rankin (Stroke Patients Only) Pre-Morbid Rankin Score: Slight disability Modified Rankin: Moderate disability           PT Diagnosis:    PT Problem List: Decreased strength;Decreased balance;Decreased mobility;Decreased coordination;Pain PT Treatment Interventions: Gait training;Functional mobility training;Therapeutic activities;Therapeutic exercise;Patient/family education;DME instruction   PT Goals Acute Rehab PT Goals PT Goal Formulation: With patient Time For Goal Achievement: 01/12/12 Potential to Achieve Goals: Good Pt will Stand: with modified independence PT Goal: Stand - Progress: Goal set today Pt will Ambulate: >150 feet;with modified independence;with least restrictive assistive device PT Goal: Ambulate - Progress: Goal set today  Visit Information  Last PT Received On: 01/07/12 Assistance Needed: +1    Subjective Data  Subjective: I'm pretty independent Patient Stated Goal: to go home   Prior Functioning  Home Living Lives With: Spouse Available Help at Discharge: Family Type of Home: House Home Access: Other (comment) Home Layout: One level Bathroom Shower/Tub: Engineer, manufacturing systems: Standard Home Adaptive Equipment: None Prior Function Level of Independence: Independent Able to Take Stairs?: No Driving: Yes Vocation: Full time employment Comments: 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 [employer] Communication: Expressive difficulties;Other (comment) Dominant Hand: Right    Cognition  Overall Cognitive Status: Appears within functional limits for tasks assessed/performed Arousal/Alertness: Awake/alert Orientation Level: Appears intact for tasks assessed Behavior During Session:  [facility] for tasks performed  Extremity/Trunk Assessment Right Upper Extremity Assessment RUE ROM/Strength/Tone: Deficits RUE ROM/Strength/Tone Deficits: noted weakness and decreased coordination with handwriting activity RUE Sensation: WFL - Light Touch RUE Coordination: Deficits Left Upper Extremity Assessment LUE ROM/Strength/Tone: WFL for tasks assessed Right Lower Extremity Assessment RLE ROM/Strength/Tone: [facility] for tasks assessed;Deficits (during ambulation) RLE Coordination: Deficits RLE Coordination Deficits: during ambulation Left Lower Extremity Assessment LLE ROM/Strength/Tone: WFL for tasks assessed   Balance    End of Session PT - End of Session Equipment Utilized During Treatment: Gait belt Activity Tolerance: Patient tolerated treatment well Patient left: in chair;with call bell/phone within reach Nurse Communication: Mobility status  GP Functional Assessment Tool Used: clinical judgement Functional Limitation: Mobility: Walking and moving around Mobility: Walking and Moving Around Current Status ([id number]): At least 20 percent but less than 40 percent impaired, limited or restricted Mobility: Walking and Moving Around Goal Status [phone]): At least 1 percent but less than 20 percent impaired, limited or restricted   Fabio Asa 01/07/2012, 2:44 PM  Charlotte Crumb, PT DPT  [phone]

## 2012-01-07 NOTE — Progress Notes (Signed)
  Echocardiogram 2D Echocardiogram has been performed.  Brian Burns 01/07/2012, 9:36 AM

## 2012-01-07 NOTE — Progress Notes (Signed)
Utilization review complete 

## 2012-01-07 NOTE — Progress Notes (Signed)
01/07/2012 1600 NCM spoke to pt and he is requesting HH. Provided list of HH agencies and pt requested [facility]. Pt states no DME needed for home. Isidoro Donning RN CCM Case Mgmt phone [phone]

## 2012-01-07 NOTE — H&P (Signed)
Family Medicine Teaching [facility] Admission History and Physical Service Pager: [phone]  Patient name: Brian Burns Medical record number: [id number] Date of birth: December 31, 1945 Age: 66 y.o. Gender: male  Primary Care Provider: Majel Homer, MD  Chief Complaint: slurred speech  Assessment and Plan: Brian Burns is a 66 y.o. year old male with a history of CVA who presents with presenting with acute onset of slurred speech, concerning for new ischemic stroke  # Slurred speech - concern for new ischemic stroke - Admit to observation, monitor on telemetry - MRI/MRA brain - Will order A1c (hold off on lipids - had full lipid panel done last month) - Carotid dopplers - Echo - Consult neurology in AM - Continue home statin, aspirin  # Bradycardia - Etiology unclear. EKG shows sinus bradycardia. - Repeat EKG in AM - Monitor on telemetry  # Cough - Check CXR - Will ask patient more history on the cough, as he was recently started on lisinopril. - Also with new CVA, would like to rule out aspiration  # Hypertension - Continue home lisinopril  # FEN/GI: - NS @ KVO - Heart healthy diet (passed swallow screen in ED)  # Dispo:  - pending stroke workup.  - Possible d/c home tomorrow if gets echo, dopplers, MRI/MRA brain done.  # Code Status: Discussed with patient, full code   History of Present Illness: Brian Burns is a 66 y.o. year old male with a hx of CVA who presents with new onset slurred speech.  Pt is here with his wife and daughter. Wife first noticed him having slurred speech yesterday. Everything he said made sense, but speech was just slurred. The speech problems seem a little worse today. No weakness in face or arms. Able to walk okay. Has had some blurry vision earlier today while in the ED but this has since resolved. No weakness in arms or legs and is able to walk on his own. Daughter noticed that he may have been dragging one of his legs. Granddaughter  also noticed that he seemed a little unstable on his feet. Daughter also was worried that he might have been using his right arm differently. He is right-handed. Was able to write his name earlier when he signed into the ED. He has been able to swallow okay.  Had a stroke in 2009. He had slurred speech at that time with some facial weakness. Patient had some residual slurred speech from that stroke, but the slurred speech was not as bad as it is now.  No fevers, chills, sick contacts, congestion, chest pain, new shortness of breath, abdominal pain, new leg swelling or pain. Had a headache two days ago and then today   Review Of Systems: Per HPI. Otherwise 12 point review of systems was performed and was unremarkable.  Patient Active Problem List  Diagnosis  . HYPERLIPIDEMIA  . Overweight  . HYPERTENSION, BENIGN SYSTEMIC  . CORONARY, ARTERIOSCLEROSIS  . CAROTID ARTERY DISEASE  . PVD WITH CLAUDICATION  . IMPOTENCE, ORGANIC  . BACK PAIN, LUMBAR, CHRONIC  . Knee pain, bilateral  . Urinary frequency  . Headache   Past Medical History: Past Medical History  Diagnosis Date  . Hypertension   . HLD (hyperlipidemia)   . Erectile dysfunction   . Chronic back pain   . CVA (cerebrovascular accident) 5/09    Rt sided  . CAD (coronary artery disease)   . Carotid artery disease   . PVD (peripheral vascular disease) with claudication  Past Surgical History: History reviewed. No pertinent past surgical history.  Home Medications: No current facility-administered medications on file prior to encounter.   Current Outpatient Prescriptions on File Prior to Encounter  Medication Sig Dispense Refill  . acetaminophen (TYLENOL) 500 MG tablet Take 1,000 mg by mouth 3 (three) times daily. (2 tablets)      . amLODipine (NORVASC) 10 MG tablet Take 1 tablet (10 mg total) by mouth daily.  30 tablet  1  . aspirin 81 MG EC tablet Take 162 mg by mouth daily.       Marland Kitchen atorvastatin (LIPITOR) 40 MG tablet  Take 1 tablet (40 mg total) by mouth daily.  90 tablet  1  . lisinopril (PRINIVIL,ZESTRIL) 10 MG tablet Take 1 tablet (10 mg total) by mouth daily.  30 tablet  3  . Tamsulosin HCl (FLOMAX) 0.4 MG CAPS Take 0.4 mg by mouth daily. In the AM      . traMADol (ULTRAM) 50 MG tablet Take 1 tablet (50 mg total) by mouth every 8 (eight) hours as needed for pain.  90 tablet  1   Social History: History  Substance Use Topics  . Smoking status: Former Smoker -- 0.5 packs/day for 40 years    Types: Cigarettes    Quit date: 01/29/2004  . Smokeless tobacco: Never Used  . Alcohol Use: No   For any additional social history documentation, please refer to relevant sections of EMR.  Family History: Family History  Problem Relation Age of Onset  . Lung disease Brother   . Kidney disease Brother   . Heart disease Brother     died at 49  . Heart disease Mother   . Kidney disease Mother   . Lung disease Mother    Allergies: Allergies  Allergen Reactions  . Novocain (Procaine Hcl)     Physical Exam: BP 138/71  Pulse 46  Temp 98.1 F (36.7 C) (Oral)  Resp 17  SpO2 97% Exam: General: NAD, cooperative HEENT: PERRL, normocephalic, ears with copious dried skin debris in canals bilateral unable to visualize TM, no cervical LAD, MMM, no oral exudates Cardiovascular: bradycardia, regular rhythm, no murmurs Respiratory: dry cough present, lungs clear to auscultation, normal respiratory effort Abdomen: +BS, soft, nontender, nondistended Extremities: no lower extremity edema. Dry skin is present on bilateral lower legs Skin: no rashes noted Neuro: Alert and oriented x4. Speech is slurred but comprehensible. Face symmetric. EOMI. Tongue protrudes midline. Hearing grossly intact and equal bilaterally. Shoulder shrug 5/5 bilaterally. SCM 5/5 bilaterally. Facial sensation to light touch equal bilaterally. Strength 5/5 in upper and lower extremities. No pronator drift. Normal finger-nose-finger with left  hand, but there is some dysmetria present on F-N-F with right hand. Rapid alternating movements normal bilaterally. Gait is steady, able to walk without assistance.  Labs and Imaging:  CBC:    Component Value Date/Time   WBC 4.7 01/06/2012 2012   HGB 11.9* 01/06/2012 2012   HCT 35.5* 01/06/2012 2012   PLT 159 01/06/2012 2012   MCV 84.7 01/06/2012 2012   NEUTROABS 2.4 01/06/2012 2012   LYMPHSABS 1.9 01/06/2012 2012   MONOABS 0.3 01/06/2012 2012   EOSABS 0.1 01/06/2012 2012   BASOSABS 0.0 01/06/2012 2012    Comprehensive Metabolic Panel:    Component Value Date/Time   NA 138 01/06/2012 2012   K 3.9 01/06/2012 2012   CL 107 01/06/2012 2012   CO2 23 01/06/2012 2012   BUN 16 01/06/2012 2012   CREATININE 1.06 01/06/2012 2012  CREATININE 1.06 12/10/2011 0952   GLUCOSE 110* 01/06/2012 2012   CALCIUM 8.6 01/06/2012 2012   AST 20 01/06/2012 2012   ALT 14 01/06/2012 2012   ALKPHOS 113 01/06/2012 2012   BILITOT 0.2* 01/06/2012 2012   PROT 6.7 01/06/2012 2012   ALBUMIN 3.5 01/06/2012 2012    Urinalysis    Component Value Date/Time   COLORURINE YELLOW 01/06/2012 2246   APPEARANCEUR CLEAR 01/06/2012 2246   LABSPEC 1.031* 01/06/2012 2246   PHURINE 6.0 01/06/2012 2246   GLUCOSEU NEGATIVE 01/06/2012 2246   HGBUR NEGATIVE 01/06/2012 2246   HGBUR negative 06/19/2007 1522   BILIRUBINUR SMALL* 01/06/2012 2246   BILIRUBINUR NEG 04/09/2011 1039   KETONESUR NEGATIVE 01/06/2012 2246   PROTEINUR 30* 01/06/2012 2246   UROBILINOGEN 2.0* 01/06/2012 2246   UROBILINOGEN 2.0 04/09/2011 1039   NITRITE NEGATIVE 01/06/2012 2246   NITRITE NEG 04/09/2011 1039   LEUKOCYTESUR NEGATIVE 01/06/2012 2246    CT Head Without Contrast: No acute intracranial abnormalities. Mild atrophy and small vessel ischemic changes. No significant change since previous study.   Levert Feinstein, MD Family Medicine PGY-1  PGY-2 Addendum: I have seen and examined patient. I agree with PGY-1 note above and have made changes to reflect my findings.  In brief, this is a 66 yo M with multiple risk factors including HTN, HLD, prior CVA, CAD, PVD who presents to ED with new dysarthria. Will admit patient for CVA work-up including MRI/MRA, echo, carotid dopplers, risk stratification labs and neuro consult. Patient was hesitant to be admitted, but anticipate if work-up can be completed, he should be able to be discharged home in next 24-48 hours based on findings of his work-up.  Charell Faulk M. Willa Brocks, M.D. 01/07/2012 7:07 AM

## 2012-01-07 NOTE — Evaluation (Addendum)
Occupational Therapy Evaluation Patient Details Name: Brian Burns MRN: [id number] DOB: 1945/11/21 Today's Date: 01/07/2012 Time: 1125-1207 OT Assessment / Plan / Recommendation Clinical Impression  Pt presents to OT with minimal decreased I with ADL activity s/p admission to hospital with slurred speech. Pt will beneift from skilled OT to increase I with ADL activity  and regain use of  R hand    OT Assessment  Patient needs continued OT Services    Follow Up Recommendations  Outpatient OT;Home health OT;Other (comment) (depending progress)       Equipment Recommendations  Toilet riser       Frequency  Min 3X/week           ADL  Grooming: Simulated;Set up Where Assessed - Grooming: Unsupported sitting Upper Body Bathing: Performed;Supervision/safety Where Assessed - Upper Body Bathing: Unsupported sitting Lower Body Bathing: Simulated;Minimal assistance Where Assessed - Lower Body Bathing: Unsupported sit to stand Upper Body Dressing: Simulated;Minimal assistance Where Assessed - Upper Body Dressing: Unsupported sitting Lower Body Dressing: Performed;Min guard Where Assessed - Lower Body Dressing: Unsupported sit to stand Toilet Transfer: Performed;Min guard Toilet Transfer Method: Sit to Barista: Regular height toilet Toileting - Clothing Manipulation and Hygiene: Performed;Min guard Where Assessed - Glass blower/designer Manipulation and Hygiene: Standing Transfers/Ambulation Related to ADLs: Decreased coordination R hand effecting  ADL activity .    OT Diagnosis: Generalized weakness;Acute pain  OT Problem List: Decreased strength;Decreased activity tolerance;Pain;Impaired UE functional use OT Treatment Interventions: Self-care/ADL training;Therapeutic activities;Patient/family education;Therapeutic exercise;Neuromuscular education   OT Goals Acute Rehab OT Goals OT Goal Formulation: With patient Time For Goal Achievement:  01/21/12 Potential to Achieve Goals: Good ADL Goals Pt Will Perform Grooming: Independently;Standing at sink ADL Goal: Grooming - Progress: Goal set today Pt Will Transfer to Toilet: Independently;Comfort height toilet ADL Goal: Toilet Transfer - Progress: Goal set today Pt Will Perform Toileting - Clothing Manipulation: Independently;Standing ADL Goal: Toileting - Clothing Manipulation - Progress: Goal set today Additional ADL Goal #1: Pt will perform activities iindicated by OT for fine motor deficits R hand, such as handwriting Ily to increase I with ADL activity  Visit Information  Last OT Received On: 01/07/12    Subjective Data    Prior Functioning     Home Living Lives With: Spouse Available Help at Discharge: Family Type of Home: House Home Access: Other (comment) (no stairs) Home Layout: One level Bathroom Shower/Tub: Engineer, manufacturing systems: Standard Home Adaptive Equipment: None Prior Function Level of Independence: Independent Able to Take Stairs?: No Driving: Yes Vocation: Full time employment Comments: 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 [employer] Communication: Expressive difficulties;Other (comment) (slurred speech) Dominant Hand: Right         Vision/Perception Vision - Assessment Eye Alignment: Within Functional Limits Perception Perception: Within Functional Limits   Cognition  Overall Cognitive Status: Appears within functional limits for tasks assessed/performed Arousal/Alertness: Awake/alert Orientation Level: Appears intact for tasks assessed Behavior During Session: [facility] for tasks performed    Extremity/Trunk Assessment Right Upper Extremity Assessment RUE ROM/Strength/Tone: Deficits RUE ROM/Strength/Tone Deficits: noted weakness and decreased coordination with handwriting activity RUE Sensation: WFL - Light Touch RUE Coordination: Deficits Left Upper Extremity Assessment LUE ROM/Strength/Tone: WFL for tasks  assessed     Mobility Bed Mobility Bed Mobility: Rolling Left Rolling Left: 4: Min assist Transfers Transfers: Stand to Sit;Sit to Stand Sit to Stand: 4: Min assist Stand to Sit: 4: Min assist  End of Session OT - End of Session Equipment Utilized During Treatment: Gait belt Activity Tolerance: Patient tolerated treatment well Patient left: in bed Nurse Communication: Mobility status  GO Functional Assessment Tool Used: clincial observation Functional Limitation: Self care Self Care Current Status (N5621): At least 20 percent but less than 40 percent impaired, limited or restricted Self Care Goal Status (H0865): At least 1 percent but less than 20 percent impaired, limited or restricted   Wisdom Seybold, [place] Kung 01/07/2012, 12:45 PM

## 2012-01-07 NOTE — Progress Notes (Signed)
*  PRELIMINARY RESULTS* Vascular Ultrasound Carotid Duplex (Doppler) has been completed.  There is no obvious evidence of hemodynamically significant internal carotid artery stenosis >40%. Vertebral arteries are patent with antegrade flow.  01/07/2012 9:28 AM Gertie Fey, RDMS, RDCS

## 2012-01-08 ENCOUNTER — Observation Stay (HOSPITAL_COMMUNITY): Payer: Managed Care, Other (non HMO)

## 2012-01-08 DIAGNOSIS — G459 Transient cerebral ischemic attack, unspecified: Secondary | ICD-10-CM | POA: Diagnosis present

## 2012-01-08 NOTE — Progress Notes (Signed)
Subjective: No recurrence of speech difficulty over the past 24 hours. Patient is also had no change in baseline weakness involving his right side.  Objective: Current vital signs: BP 148/73  Pulse 47  Temp 98.2 F (36.8 C) (Oral)  Resp 18  Ht 6\' 4"  (1.93 m)  Wt 99.2 kg (218 lb 11.1 oz)  BMI 26.62 kg/m2  SpO2 100%  Neurologic Exam: Alert and in no acute distress. Mental status was unremarkable. No receptive or expressive aphasia. Mild to moderate dysarthria.  Lab Results: Results for orders placed during the hospital encounter of 01/06/12 (from the past 48 hour(s))  CBC WITH DIFFERENTIAL     Status: Abnormal   Collection Time   01/06/12  8:12 PM      Component Value Range Comment   WBC 4.7  4.0 - 10.5 K/uL    RBC 4.19 (*) 4.22 - 5.81 MIL/uL    Hemoglobin 11.9 (*) 13.0 - 17.0 g/dL    HCT 16.1 (*) 09.6 - 52.0 %    MCV 84.7  78.0 - 100.0 fL    MCH 28.4  26.0 - 34.0 pg    MCHC 33.5  30.0 - 36.0 g/dL    RDW 04.5  40.9 - 81.1 %    Platelets 159  150 - 400 K/uL    Neutrophils Relative 51  43 - 77 %    Neutro Abs 2.4  1.7 - 7.7 K/uL    Lymphocytes Relative 40  12 - 46 %    Lymphs Abs 1.9  0.7 - 4.0 K/uL    Monocytes Relative 7  3 - 12 %    Monocytes Absolute 0.3  0.1 - 1.0 K/uL    Eosinophils Relative 2  0 - 5 %    Eosinophils Absolute 0.1  0.0 - 0.7 K/uL    Basophils Relative 0  0 - 1 %    Basophils Absolute 0.0  0.0 - 0.1 K/uL   COMPREHENSIVE METABOLIC PANEL     Status: Abnormal   Collection Time   01/06/12  8:12 PM      Component Value Range Comment   Sodium 138  135 - 145 mEq/L    Potassium 3.9  3.5 - 5.1 mEq/L    Chloride 107  96 - 112 mEq/L    CO2 23  19 - 32 mEq/L    Glucose, Bld 110 (*) 70 - 99 mg/dL    BUN 16  6 - 23 mg/dL    Creatinine, Ser 9.14  0.50 - 1.35 mg/dL    Calcium 8.6  8.4 - 78.2 mg/dL    Total Protein 6.7  6.0 - 8.3 g/dL    Albumin 3.5  3.5 - 5.2 g/dL    AST 20  0 - 37 U/L    ALT 14  0 - 53 U/L    Alkaline Phosphatase 113  39 - 117 U/L    Total Bilirubin 0.2 (*) 0.3 - 1.2 mg/dL    GFR calc non Af Amer 72 (*) >90 mL/min    GFR calc Af Amer 84 (*) >90 mL/min   URINALYSIS, ROUTINE W REFLEX MICROSCOPIC     Status: Abnormal   Collection Time   01/06/12 10:46 PM      Component Value Range Comment   Color, Urine YELLOW  YELLOW    APPearance CLEAR  CLEAR    Specific Gravity, Urine 1.031 (*) 1.005 - 1.030    pH 6.0  5.0 - 8.0    Glucose, UA  NEGATIVE  NEGATIVE mg/dL    Hgb urine dipstick NEGATIVE  NEGATIVE    Bilirubin Urine SMALL (*) NEGATIVE    Ketones, ur NEGATIVE  NEGATIVE mg/dL    Protein, ur 30 (*) NEGATIVE mg/dL    Urobilinogen, UA 2.0 (*) 0.0 - 1.0 mg/dL    Nitrite NEGATIVE  NEGATIVE    Leukocytes, UA NEGATIVE  NEGATIVE   URINE MICROSCOPIC-ADD ON     Status: Abnormal   Collection Time   01/06/12 10:46 PM      Component Value Range Comment   Squamous Epithelial / LPF FEW (*) RARE    WBC, UA 0-2  <3 WBC/hpf    RBC / HPF 0-2  <3 RBC/hpf    Bacteria, UA FEW (*) RARE    Urine-Other MUCOUS PRESENT     CBC     Status: Abnormal   Collection Time   01/07/12  6:30 AM      Component Value Range Comment   WBC 4.5  4.0 - 10.5 K/uL    RBC 4.00 (*) 4.22 - 5.81 MIL/uL    Hemoglobin 11.3 (*) 13.0 - 17.0 g/dL    HCT 16.1 (*) 09.6 - 52.0 %    MCV 84.8  78.0 - 100.0 fL    MCH 28.3  26.0 - 34.0 pg    MCHC 33.3  30.0 - 36.0 g/dL    RDW 04.5  40.9 - 81.1 %    Platelets 141 (*) 150 - 400 K/uL   TSH     Status: Abnormal   Collection Time   01/07/12 10:56 AM      Component Value Range Comment   TSH 6.765 (*) 0.350 - 4.500 uIU/mL     Studies/Results: Dg Chest 2 View  01/07/2012  *RADIOLOGY REPORT*  Clinical Data: Cough and stroke.  CHEST - 2 VIEW  Comparison: 05/01/2009  Findings: The heart size and pulmonary vascularity are normal. The lungs appear clear and expanded without focal air space disease or consolidation. No blunting of the costophrenic angles.  No pneumothorax.  Mediastinal contours appear intact.  Calcification of  the aorta.  No significant change since previous study.  IMPRESSION: No evidence of active pulmonary disease.   Original Report Authenticated By: Burman Nieves, M.D.    Ct Head Wo Contrast  01/06/2012  *RADIOLOGY REPORT*  Clinical Data: Slurred speech and difficulty putting sentences together.  CT HEAD WITHOUT CONTRAST  Technique:  Contiguous axial images were obtained from the base of the skull through the vertex without contrast.  Comparison: 06/22/2007  Findings: Ventricular dilatation consistent with central atrophy. Patchy low attenuation changes in the deep white matter consistent with small vessel ischemia.  Old lacunar infarct in the right basal ganglia.  Focal low attenuation area in the right cerebellum likely represents old infarct.  This is stable since previous study.  No mass effect or midline shift.  No abnormal extra-axial fluid collections.  Gray-white matter junctions appear distinct.  Basal cisterns are not effaced.  No evidence of acute intracranial hemorrhage.  Prominent vascular calcifications.  No depressed skull fractures.  Visualized paranasal sinuses and mastoid air cells are not opacified.  IMPRESSION: No acute intracranial abnormalities.  Mild atrophy and small vessel ischemic changes.  No significant change since previous study.   Original Report Authenticated By: Burman Nieves, M.D.     Medications:  Scheduled:   . amLODipine  10 mg Oral Daily  . atorvastatin  40 mg Oral Daily  . dipyridamole-aspirin  1 capsule Oral BID  .  heparin  5,000 Units Subcutaneous Q8H  . lisinopril  10 mg Oral Daily  . LORazepam  1 mg Intravenous Once  . Tamsulosin HCl  0.4 mg Oral Daily  . [DISCONTINUED] aspirin EC  81 mg Oral Daily  . [DISCONTINUED] dipyridamole-aspirin  1 capsule Oral Daily   AVW:[id number], acetaminophen, ondansetron (ZOFRAN) IV, senna-docusate  Assessment/Plan: 66 year old man with history of previous left cerebral infarction who presented following multiple  apparent TIAs with transient speech difficulty. Patient is on Aggrenox twice a day for stroke prevention. He's also on Lipitor for hyperlipidemia. Hemoglobin A1c is pending. MRI is scheduled for later today.  No changes in current management recommended. We will continue to follow him with you.  C.R. Roseanne Reno, MD [facility] [phone]  01/08/2012  10:14 AM

## 2012-01-08 NOTE — Progress Notes (Signed)
Occupational Therapy Treatment Patient Details Name: Brian Burns MRN: [id number] DOB: [birth year] Today's Date: 01/08/2012 Time: 2130-8657 OT Time Calculation (min): 51 min  OT Assessment / Plan / Recommendation Comments on Treatment Session Focus of therapy session on ADL and R UE coordination.  Pt is progressing steadily.    Follow Up Recommendations  Home health OT    Barriers to Discharge       Equipment Recommendations  Toilet riser    Recommendations for Other Services    Frequency Min 3X/week   Plan Discharge plan remains appropriate    Precautions / Restrictions Precautions Precautions: Fall   Pertinent Vitals/Pain No pain    ADL  Grooming: Supervision/safety;Wash/dry hands Where Assessed - Grooming: Unsupported standing Toilet Transfer: Radiographer, therapeutic Method: Sit to Barista: Comfort height toilet Toileting - Architect and Hygiene: Independent Where Assessed - Toileting Clothing Manipulation and Hygiene: Standing Transfers/Ambulation Related to ADLs: supervision for safety, no device ADL Comments: Issued brown foam build up for pen and practiced signature.  Issued med soft theraputty and instructed in use.    OT Diagnosis:    OT Problem List:   OT Treatment Interventions:     OT Goals Acute Rehab OT Goals OT Goal Formulation: With patient Time For Goal Achievement: 01/21/12 Potential to Achieve Goals: Good ADL Goals Pt Will Perform Grooming: Independently;Standing at sink ADL Goal: Grooming - Progress: Progressing toward goals Pt Will Transfer to Toilet: Independently;Comfort height toilet ADL Goal: Toilet Transfer - Progress: Progressing toward goals Pt Will Perform Toileting - Clothing Manipulation: Independently;Standing ADL Goal: Toileting - Clothing Manipulation - Progress: Met Additional ADL Goal #1: Pt will perform activities iindicated by OT for fine motor deficits R hand, such as  handwriting Ily to increase I with ADL activity (progressing)  Visit Information  Last OT Received On: 01/08/12 Assistance Needed: +1    Subjective Data      Prior Functioning  Prior Function Vocation: Retired    IT consultant  Overall Cognitive Status: Appears within functional limits for tasks assessed/performed Arousal/Alertness: Awake/alert Orientation Level: Appears intact for tasks assessed Behavior During Session: [facility] for tasks performed    Mobility  Shoulder Instructions Bed Mobility Bed Mobility: Rolling Right;Right Sidelying to Sit;Sitting - Scoot to Edge of Bed Rolling Right: 6: Modified independent (Device/Increase time);With rail Right Sidelying to Sit: 6: Modified independent (Device/Increase time);With rails Sitting - Scoot to Edge of Bed: 7: Independent Transfers Sit to Stand: 5: Supervision;From bed;From toilet;From chair/3-in-1 Stand to Sit: 5: Supervision;To chair/3-in-1;To toilet       Exercises      Balance     End of Session OT - End of Session Activity Tolerance: Patient tolerated treatment well Patient left: in chair;with chair alarm set;with call bell/phone within reach  GO     Evern Bio 01/08/2012, 11:23 AM [phone]

## 2012-01-08 NOTE — Progress Notes (Signed)
Pt dc instructions provided. Pt verbalized understanding. Iv dc intact. Pt under no s/s distress. 

## 2012-01-08 NOTE — Progress Notes (Signed)
   CARE MANAGEMENT NOTE 01/08/2012  Patient:  Brian Burns, Brian Burns   Account Number:  [id number]  Date Initiated:  01/07/2012  Documentation initiated by:  [facility]  Subjective/Objective Assessment:   slurred speech     Action/Plan:   lives at home with wife   Anticipated DC Date:  01/07/2012   Anticipated DC Plan:  HOME/SELF CARE      DC Planning Services  CM consult      [facility] Choice  HOME HEALTH   Choice offered to / List presented to:  C-1 Patient      DME agency  [facility]     HH arranged  HH-2 PT  HH-3 OT  HH-5 SPEECH THERAPY      HH agency  [facility]   Status of service:  Completed, signed off Medicare Important Message given?   (If response is "NO", the following Medicare IM given date fields will be blank) Date Medicare IM given:   Date Additional Medicare IM given:    Discharge Disposition:  HOME W HOME HEALTH SERVICES  Per UR Regulation:  Reviewed for med. necessity/level of care/duration of stay  If discussed at Long Length of Stay Meetings, dates discussed:    Comments:  01/08/2012 1200 NCM notified [facility] of order for [facility] PT, OT and SLP. Pt requesting 3n1 bedside commode for home.  Contacted [facility] and they do not deliver 3n1 to hospital. Pt will have to pick up from [employer]. Provided address and phone for [facility] on d/c instructions. Faxed order to [facility] and noted that pt will pick up DME.  Isidoro Donning RN CCM Case Mgmt phone [phone]  01/07/2012 1600 NCM spoke to pt and he is requesting HH. Provided list of HH agencies and pt requested [facility]. Pt states no DME needed for home. Isidoro Donning RN CCM Case Mgmt phone [phone]

## 2012-01-08 NOTE — Progress Notes (Signed)
PT Cancellation Note  Patient Details Name: Brian Burns MRN: [id number] DOB: November 19, 1945   Cancelled Treatment:    Reason Eval/Treat Not Completed: Patient at procedure or test/unavailable (pt off the floor for testing)   Fabio Asa 01/08/2012, 3:38 PM

## 2012-01-08 NOTE — Evaluation (Addendum)
Speech Language Pathology Evaluation Patient Details Name: Brian Burns MRN: [id number] DOB: 1945-06-26 Today's Date: 01/08/2012 Time: 7829-5621 SLP Time Calculation (min): 18 min  Problem List:  Patient Active Problem List  Diagnosis  . HYPERLIPIDEMIA  . Overweight  . HYPERTENSION, BENIGN SYSTEMIC  . CORONARY, ARTERIOSCLEROSIS  . CAROTID ARTERY DISEASE  . PVD WITH CLAUDICATION  . IMPOTENCE, ORGANIC  . BACK PAIN, LUMBAR, CHRONIC  . Knee pain, bilateral  . Urinary frequency  . Headache   Past Medical History:  Past Medical History  Diagnosis Date  . Hypertension   . HLD (hyperlipidemia)   . Erectile dysfunction   . Chronic back pain   . CVA (cerebrovascular accident) 5/09    Rt sided  . CAD (coronary artery disease)   . Carotid artery disease   . PVD (peripheral vascular disease) with claudication    Past Surgical History: History reviewed. No pertinent past surgical history. HPI:  66 year old male admitted 01/06/12 due to slurred speech.  PMH significant for Left CVA (2009), HLD, obesity, HTN, CAD, PVD, back pain, baseline right weakness and dysarthria. CXR negative, CT negative for acute abnormality.  MRI pending.   Assessment / Plan / Recommendation Clinical Impression  Pt exhibits mild dysarthria at baseline, which pt reports was worse and is now improving.  Pt is edentulous, and likely cannot wear dentures. This may contribute significantly to his reduced intelligibility. Pt was provided with written oral motor strengthening exercises and compensatory strategies to improve clarity of speech.  Pt reading ability is poor, however, he was encouraged to share this information with family for assistance with completing exercises.  Swallow function  was not assessed at this time, as pt passed RN stroke swallow screen.  No difficulties reported.    SLP Assessment  All further Speech Lanaguage Pathology  needs can be addressed in the next venue of care    Follow Up  Recommendations  Home health SLP        Pertinent Vitals/Pain None reported   SLP Evaluation Prior Functioning  Cognitive/Linguistic Baseline: Within functional limits Education: 7th grade Vocation: Retired   IT consultant  Overall Cognitive Status: Appears within functional limits for tasks assessed Arousal/Alertness: Awake/alert Orientation Level: Oriented X4    Comprehension  Auditory Comprehension Overall Auditory Comprehension: Appears within functional limits for tasks assessed Reading Comprehension Reading Status: Impaired (unable to read at baseline)    Expression Expression Primary Mode of Expression: Verbal Verbal Expression Overall Verbal Expression: Appears within functional limits for tasks assessed Written Expression Written Expression: Not tested   Oral / Motor Oral Motor/Sensory Function Overall Oral Motor/Sensory Function: Appears within functional limits for tasks assessed Motor Speech Overall Motor Speech: Impaired at baseline Respiration: Within functional limits Phonation: Normal Resonance: Within functional limits Articulation: Impaired Level of Impairment: Conversation Intelligibility: Intelligibility reduced Conversation: 75-100% accurate Motor Planning: Witnin functional limits Motor Speech Errors: Not applicable Interfering Components: Inadequate dentition;Premorbid status Effective Techniques: Slow rate;Increased vocal intensity;Over-articulate;Pause (reviewed with pt, written exercises left with pt as well)   [name] [facility], CCC-SLP [phone]  01/08/2012, 9:53 AM  Motor Speech [phone]

## 2012-01-08 NOTE — Progress Notes (Signed)
Family Medicine Teaching Service Daily Progress Note Service Page: [phone]  Patient Assessment: Brian Burns is a 66 y.o. year old male with a history of CVA who presents with presenting with acute onset of slurred speech, concerning for new ischemic stroke   Subjective: This morning patient says he is feeling well. He hasn't gotten his MRI yet. Hoping he can go home after that MRI.  Objective: Temp:  [97.4 F (36.3 C)-98.2 F (36.8 C)] 98.2 F (36.8 C) (12/11 0541) Pulse Rate:  [42-50] 47  (12/11 0541) Resp:  [18-20] 18  (12/11 0541) BP: (125-148)/(72-80) 148/73 mmHg (12/11 0541) SpO2:  [98 %-100 %] 100 % (12/11 0541) Exam: General: NAD, pleasant Cardiovascular: bradycardic, regular rhythm, no murmurs Respiratory: normal respiratory effort Abdomen: nontender to palpation Neuro: speech slurred, unchanged from admission. Face symmetric. SCM 5/5 bilaterally. Shoulder shrug 5/5 bilaterally. Grip 5/5 bilaterally. Hip flexion 5/5 B/L.   I have reviewed the patient's medications, labs, imaging, and diagnostic testing.  Notable results are summarized below.  Medications:  Scheduled Meds: Amlodipine 10mg  daily Atorvastatin 40mg  daily Aggrenox 200-25mg  BID Heparin 5000units q8h Lisinopril 10mg  daily Tamulosin 0.4mg  daily  PRN Meds: Tylenol 650 q4h prn Zofran 4mg  q6h prn Senna-docusate 1 tab qhs prn  IVF: NS @ KVO  Labs:  Hgb A1c pending  Imaging/Diagnostic Tests:  CXR on admission:  No evidence of active pulmonary disease.  Echo 12/10:  - Left ventricle: The cavity size was mildly to moderately dilated. Wall thickness was normal. Systolic function was mildly reduced. The estimated ejection fraction was in the range of 45% to 50%, by biplane method of disks. Mild diffuse hypokinesis. Doppler parameters are consistent with abnormal left ventricular relaxation (grade 1 diastolic dysfunction). Doppler parameters are consistent with likley mild elevation inventricular  filling pressure. - Aortic valve: Trivial regurgitation. - Atrial septum: No defect or patent foramen ovale was identified. - Pulmonary arteries: PA peak pressure: 35-77mm Hg (S). - A sinus bradycardia rhythm was noted on this study, making this study technically difficult for the assessment of cardiac function. No cardiac source of embolism wasidentified, but cannot be ruled out on the basis of this examination. The right ventricular systolic pressure was increased consistent with mild pulmonary hypertension.  MRI brain ordered, waiting for this to be done.  Assessment/Plan:  Brian Burns Brian Burns is a 66 y.o. year old male with a history of CVA who presents with presenting with acute onset of slurred speech, concerning for new ischemic stroke.   # Slurred speech - concern for new ischemic stroke  - Echo without source of embolus, carotids no significant stenosis - MRI/MRA brain pending - A1c pending, had recent lipid panel in 11/13 with subsequent increase in statin - Switched from aspirin to Aggrenox yesterday for secondary prevention of CVA with concurrent CAD - PT recs: outpatient PT/home health PT - OT recs: home health OT with toilet riser - SLP recs: home health SLP  # Bradycardia  - Etiology unclear. EKG shows sinus bradycardia. Not on any beta blockers. - Monitor on telemetry   # Cough  - CXR negative upon admission - Will ask patient more history on the cough, as he was recently started on lisinopril.   # Hypertension  - Continue home lisinopril & amlodipine  # FEN/GI:  - NS @ KVO  - Heart healthy diet (passed swallow screen in ED)   # Dispo:  - Plan for d/c home today after MRI  # Code Status: Discussed with patient, full code  Chrisandra Netters, MD Family Medicine PGY-1

## 2012-01-08 NOTE — Progress Notes (Signed)
Family Medicine Teaching Service Attending Note  I interviewed and examined patient Brian Burns and reviewed their tests and x-rays.  I discussed with Brian Burns and reviewed their note for today.  I agree with their assessment and plan.     Additionally  Feels like he is back to normal  And wants to go home Alert oriented Speech is intelligle but slurred Able to stand and walk without assistance - but slowly and hesitantly TSH - mildly high - Check at T4 today or at outpatient follow up

## 2012-01-09 ENCOUNTER — Encounter: Payer: Self-pay | Admitting: Family Medicine

## 2012-01-09 ENCOUNTER — Ambulatory Visit (INDEPENDENT_AMBULATORY_CARE_PROVIDER_SITE_OTHER): Payer: Managed Care, Other (non HMO) | Admitting: Family Medicine

## 2012-01-09 VITALS — BP 127/73 | HR 58 | Temp 98.6°F | Ht 76.0 in | Wt 216.0 lb

## 2012-01-09 DIAGNOSIS — I639 Cerebral infarction, unspecified: Secondary | ICD-10-CM

## 2012-01-09 DIAGNOSIS — I6350 Cerebral infarction due to unspecified occlusion or stenosis of unspecified cerebral artery: Secondary | ICD-10-CM

## 2012-01-09 LAB — HEMOGLOBIN A1C: Mean Plasma Glucose: 140 mg/dL — ABNORMAL HIGH (ref <117)

## 2012-01-09 NOTE — Discharge Summary (Signed)
Family Medicine Teaching [facility] Discharge Summary  Patient name: Brian Burns Medical record number: [id number] Date of birth: July 07, 1945 Age: 66 y.o. Gender: male Date of Admission: 01/06/2012  Date of Discharge: 01/08/12 Admitting Physician: Tobin Chad, MD  Primary Care Provider: Majel Homer, MD  Indication for Hospitalization: new onset slurred speech  Discharge Diagnoses:  1. Acute left paramedian upper pontine infarct 2. Slurred speech 3. Bradycardia, asymptomatic 4. Hypertension 5. Hyperlipidemia 6. CAD 7. History of carotid artery disease 8. Grade I diastolic dysfunction 9. Ejection fraction 45-50% 10. Mild pulmonary hypertension  Brief Hospital Course:  Brian Burns is a 66 y.o. male with a history of CVA who presented to the ER with acute onset of slurred speech. Initial CT of the head was negative for any bleed. Admission exam was significant for intelligible but slurred speech and right-sided dysmetria on finger-nose-finger testing. He was admitted to the hospital for a stroke workup and risk stratification testing, which included MRI/MRA brain, echocardiogram, carotid dopplers, and Hgb A1c. A lipid panel was not done as patient had one in the outpatient setting one month prior to admission, with accompanied change in statin regimen. Echo did not show a cardiac source of embolus, and carotid artery dopplers did not show significant stenosis. MRI of the brain showed acute left pontine infarct. MRA showed irregular basilar artery with 50% stenosis, multiple flow reducing lesions of left vertebral artery, and 75% stenosis of supraclinoid right ICA. Hgb A1c was still pending at time of discharge. As patient had previously been on aspirin and failed this regimen, his aspirin was discontinued and Aggrenox therapy was initiated.  PT, OT, and SLP saw and evaluated patient, and recommended home health PT/OT/SLP. These services were set up prior to discharge.   Of  note, patient was bradycardic (as low as mid-30s) throughout hospitalization, and was asymptomatic. EKG's showed sinus bradycardia. A TSH was checked, which was slightly elevated at 6.765. A free T4 was ordered, but was still pending at the time of discharge.  Consultations: Neurology  Procedures: none  Significant Labs and Imaging:  TSH 6.765 Free T4 pending Hgb A1c pending  CXR on admission:  No evidence of active pulmonary disease.   Echo 12/10:  - Left ventricle: The cavity size was mildly to moderately dilated. Wall thickness was normal. Systolic function was mildly reduced. The estimated ejection fraction was in the range of 45% to 50%, by biplane method of disks. Mild diffuse hypokinesis. Doppler parameters are consistent with abnormal left ventricular relaxation (grade 1 diastolic dysfunction). Doppler parameters are consistent with likley mild elevation inventricular filling pressure. - Aortic valve: Trivial regurgitation. - Atrial septum: No defect or patent foramen ovale was identified. - Pulmonary arteries: PA peak pressure: 35-60mm Hg (S). - A sinus bradycardia rhythm was noted on this study, making this study technically difficult for the assessment of cardiac function. No cardiac source of embolism wasidentified, but cannot be ruled out on the basis of this examination. The right ventricular systolic pressure was increased consistent with mild pulmonary hypertension.  Carotid Dopplers 01/07/12: No significant extracranial carotid artery stenosis demonstrated. Vertebrals are patent with antegrade flow.  MRI Brain 01/08/12: There is an acute left paramedian upper pontine infarct with restricted diffusion. No other acute infarcts are seen. The distribution of this infarct likely involves the basilar perforator  branches. There is moderate atrophy with chronic microvascular ischemic change affecting the periventricular subcortical white matter. There is a moderate sized remote  right basal ganglia infarct. There are  no foci of chronic hemorrhage. Remote right cerebellar hemispheric infarct also noted. No worrisome osseous or midline lesions. Major intracranial mass structures patent. No acute sinus or mastoid disease. Impression: Acute left paramedian upper pontine infarct with restricted diffusion. No hemorrhage. Chronic infarctions as described.  MRA Brain 01/08/12: Impression: Moderately irregular basilar artery in its proximal segment; 50% stenosis is suspected. Moderate disease affects the left greater than right distal vertebral arteries with multiple likely flow reducing lesions of the left vertebral. 75% stenosis supraclinoid right ICA.   Discharge Medications:    Medication List     As of 01/09/2012 12:06 AM    STOP taking these medications         aspirin 81 MG EC tablet      TAKE these medications         acetaminophen 325 MG tablet   Commonly known as: TYLENOL   Take 2 tablets (650 mg total) by mouth every 6 (six) hours as needed for pain or fever.      amLODipine 10 MG tablet   Commonly known as: NORVASC   Take 1 tablet (10 mg total) by mouth daily.      atorvastatin 40 MG tablet   Commonly known as: LIPITOR   Take 1 tablet (40 mg total) by mouth daily.      dipyridamole-aspirin 200-25 MG per 12 hr capsule   Commonly known as: AGGRENOX   Take 1 capsule by mouth 2 (two) times daily.      lisinopril 10 MG tablet   Commonly known as: PRINIVIL,ZESTRIL   Take 1 tablet (10 mg total) by mouth daily.      Tamsulosin HCl 0.4 MG Caps   Commonly known as: FLOMAX   Take 0.4 mg by mouth daily. In the AM      traMADol 50 MG tablet   Commonly known as: ULTRAM   Take 1 tablet (50 mg total) by mouth every 8 (eight) hours as needed for pain.        Issues for Follow Up:  - Aggrenox was started during this admission (and home med aspirin discontinued). Recommend monitoring for bleeding side effects. - On admission, pt was noted to have a dry  cough. He was recently started on lisinopril. Would monitor for resolution of cough, and if unresolved in several weeks time, consider switching to an ARB or other antihypertensive. - Pt was bradycardic throughout hospitalization, with EKG's showing sinus bradycardia. To evaluate bradycardia, TSH was checked and found to be elevated. Free T4 was pending at time of discharge. This will need to be followed up, and if indicated, pt started on thyroid supplementation. - Follow up results of Hgb A1c - pending at time of discharge - Echo showed reduced EF of 45-50% and grade I diastolic dysfunction, with mild pulmonary hypertension. Would recommend following for signs of worsening heart failure.  Outstanding Results: -Hgb A1c -Free T4      Follow-up Information    Follow up with DE LA CRUZ,IVY, DO. On 01/09/2012. (at 9:00am)    Contact information:   [address] [place] [address] [phone]       Follow up with [facility]. (Home Health Occupational Therapy, Physical Therapy, and Speech Therapy)    Contact information:   [phone]         Discharge Condition: stable  Discharge Disposition: to home with [facility] PT/OT/SLP  Discharge Instructions: Please refer to Patient Instructions section of EMR for full details.  Patient was counseled  important signs and symptoms that should prompt return to medical care, changes in medications, dietary instructions, activity restrictions, and follow up appointments.    Levert Feinstein, MD Family Medicine PGY-1

## 2012-01-09 NOTE — Patient Instructions (Addendum)
It was nice to meet you Brian Burns.  Start taking AGGRENOX today.  This is the blood thinner for stroke prevention. Remember to call [facility] to start PT and Speech Therapy today. Return to clinic in 4 weeks with Dr. Louanne Belton to make sure you are doing well. Take it easy for the next few weeks and work with your therapists. If you have any questions or concerns, please call our office.

## 2012-01-09 NOTE — Discharge Summary (Signed)
I have reviewed this discharge summary and agree.    

## 2012-01-10 ENCOUNTER — Encounter: Payer: Self-pay | Admitting: Family Medicine

## 2012-01-10 DIAGNOSIS — I639 Cerebral infarction, unspecified: Secondary | ICD-10-CM | POA: Insufficient documentation

## 2012-01-10 NOTE — Assessment & Plan Note (Addendum)
Slurred speech improving, but patient continues to endorse poor balance/unsteady gait.  Weaker on RT side of body, possibly residual effects from previous CVA.   - Advised patient and daughter to pick up Aggrenox and start medication today - Advised them to call [facility] today and set up first appointment for PT/OT/SLP - Recommended increasing physical activity as tolerated, avoid driving for now until evaluated by PT - Schedule follow up appointment with PCP in 2-4 weeks to assess patient after starting Aggrenox and follow up HA1c - May need to repeat TSH at next appointment as well - Red flags reviewed for prompt return to clinic or ER

## 2012-01-10 NOTE — Progress Notes (Signed)
  Subjective:    Patient ID: Brian Burns, male    DOB: 07-18-1945, 66 y.o.   MRN: [id number]  HPI  Brian Burns is a 66 y.o. Male here for hospital follow up for slurred speech, found to have LT acute pontine infarct on MRI.  He was admitted to the hospital 01/06/12 and discharged yesterday 01/08/12.  Echo did not show a cardiac source of embolus, and carotid artery dopplers did not show significant stenosis.  MRA showed irregular basilar artery with 50% stenosis, multiple flow reducing lesions of left vertebral artery, and 75% stenosis of supraclinoid right ICA.  HA1c still pending.  As patient had previously been on aspirin and failed this regimen, aspirin was discontinued and Aggrenox therapy initiated, however patient has not picked up Aggrenox from pharmacy yet.  He and daughter plan to pick it up after clinic.    Patient's speech seems to be slowly improving per patient and daughter, but he complains of unsteady gait and poor balance.  PT/OT evaluated patient, and recommended home health PT/OT/SLP.  Daughter has not called [facility] to set up appointment time yet.  I encouraged her to call them today, especially since he may need specific equipment to help with recovery.  Bradycardia: In hospital, he was bradycardic into 30s, but asymptomatic.  TSH was elevated, but free T4 WNL.  In clinic today, HR improved 58.  Review of Systems  Per HPI    Objective:   Physical Exam  Constitutional: He is oriented to person, place, and time. He appears well-nourished. No distress.  HENT:  Head: Normocephalic and atraumatic.  Cardiovascular: Normal rate and regular rhythm.   Pulmonary/Chest: Effort normal.  Neurological: He is alert and oriented to person, place, and time. No sensory deficit.       Slurred speech; 4/5 motor strength RT upper and lower extremities compared to 5/5 strength on LT; RT side of mouth droops; + Romberg; needed to hold on to objects while ambulating around room due to poor  balance       Assessment & Plan:

## 2012-01-14 ENCOUNTER — Inpatient Hospital Stay: Payer: Managed Care, Other (non HMO) | Admitting: Family Medicine

## 2012-01-15 ENCOUNTER — Encounter: Payer: Self-pay | Admitting: Neurosurgery

## 2012-01-16 ENCOUNTER — Encounter (INDEPENDENT_AMBULATORY_CARE_PROVIDER_SITE_OTHER): Payer: Managed Care, Other (non HMO) | Admitting: *Deleted

## 2012-01-16 ENCOUNTER — Encounter: Payer: Self-pay | Admitting: Neurosurgery

## 2012-01-16 ENCOUNTER — Ambulatory Visit (INDEPENDENT_AMBULATORY_CARE_PROVIDER_SITE_OTHER): Payer: Managed Care, Other (non HMO) | Admitting: Neurosurgery

## 2012-01-16 VITALS — BP 149/78 | HR 43 | Resp 16 | Ht 76.0 in | Wt 221.6 lb

## 2012-01-16 DIAGNOSIS — I724 Aneurysm of artery of lower extremity: Secondary | ICD-10-CM

## 2012-01-16 DIAGNOSIS — I739 Peripheral vascular disease, unspecified: Secondary | ICD-10-CM

## 2012-01-16 DIAGNOSIS — I714 Abdominal aortic aneurysm, without rupture: Secondary | ICD-10-CM

## 2012-01-16 NOTE — Progress Notes (Addendum)
[facility] [place] AAA/PAD/PVD Office Note  CC: PVD and AAA surveillance Referring Physician: Fields  History of Present Illness: 66 year old male patient of Dr. Darrick Penna followed for small known AAA as well as peripheral vascular disease. The patient states he does have some lower extremity pain however, the patient describes a radicular type pain that radiates from his back into his thighs and lateral calves, per Dr. Darrick Penna last night the patient may have superficial femoral artery stenosis or occlusion however the patient states his back pain is more concerning to him then his lower extremity pain and declines any further diagnostics or diagnostic intervention for his PVD. The patient denies any unusual abdominal or back pain. Past Medical History  Diagnosis Date  . Hypertension   . HLD (hyperlipidemia)   . Erectile dysfunction   . Chronic back pain   . CAD (coronary artery disease)   . Carotid artery disease   . PVD (peripheral vascular disease) with claudication   . CVA (cerebrovascular accident) 5/09    Rt sided  . Stroke 01/06/2012    ROS: [x]  Positive   [ ]  Denies    General: [ ]  Weight loss, [ ]  Fever, [ ]  chills Neurologic: [ ]  Dizziness, [ ]  Blackouts, [ ]  Seizure [ ]  Stroke, [ ]  "Mini stroke", [ ]  Slurred speech, [ ]  Temporary blindness; [ ]  weakness in arms or legs, [ ]  Hoarseness Cardiac: [ ]  Chest pain/pressure, [ ]  Shortness of breath at rest [ ]  Shortness of breath with exertion, [ ]  Atrial fibrillation or irregular heartbeat Vascular: [ ]  Pain in legs with walking, [ ]  Pain in legs at rest, [ ]  Pain in legs at night,  [ ]  Non-healing ulcer, [ ]  Blood clot in vein/DVT,   Pulmonary: [ ]  Home oxygen, [ ]  Productive cough, [ ]  Coughing up blood, [ ]  Asthma,  [ ]  Wheezing Musculoskeletal:  [ ]  Arthritis, [ ]  Low back pain, [ ]  Joint pain Hematologic: [ ]  Easy Bruising, [ ]  Anemia; [ ]  Hepatitis Gastrointestinal: [ ]  Blood in stool, [ ]   Gastroesophageal Reflux/heartburn, [ ]  Trouble swallowing Urinary: [ ]  chronic Kidney disease, [ ]  on HD - [ ]  MWF or [ ]  TTHS, [ ]  Burning with urination, [ ]  Difficulty urinating Skin: [ ]  Rashes, [ ]  Wounds Psychological: [ ]  Anxiety, [ ]  Depression   Social History History  Substance Use Topics  . Smoking status: Former Smoker -- 0.5 packs/day for 40 years    Types: Cigarettes    Quit date: 01/29/2004  . Smokeless tobacco: Never Used  . Alcohol Use: No    Family History Family History  Problem Relation Age of Onset  . Lung disease Brother   . Kidney disease Brother   . Heart disease Brother     died at 67  . Heart disease Mother   . Kidney disease Mother   . Lung disease Mother     Allergies  Allergen Reactions  . Novocain (Procaine Hcl)     Current Outpatient Prescriptions  Medication Sig Dispense Refill  . acetaminophen (TYLENOL) 325 MG tablet Take 2 tablets (650 mg total) by mouth every 6 (six) hours as needed for pain or fever.      Marland Kitchen amLODipine (NORVASC) 10 MG tablet Take 1 tablet (10 mg total) by mouth daily.  30 tablet  1  . atorvastatin (LIPITOR) 40 MG tablet Take 1 tablet (40 mg total) by mouth daily.  90 tablet  1  .  dipyridamole-aspirin (AGGRENOX) 200-25 MG per 12 hr capsule Take 1 capsule by mouth 2 (two) times daily.  60 capsule  0  . lisinopril (PRINIVIL,ZESTRIL) 10 MG tablet Take 1 tablet (10 mg total) by mouth daily.  30 tablet  3  . Tamsulosin HCl (FLOMAX) 0.4 MG CAPS Take 0.4 mg by mouth daily. In the AM      . traMADol (ULTRAM) 50 MG tablet Take 1 tablet (50 mg total) by mouth every 8 (eight) hours as needed for pain.  90 tablet  1    Physical Examination  Filed Vitals:   01/16/12 1047  BP: 149/78  Pulse: 43  Resp: 16    Body mass index is 26.97 kg/(m^2).  General:  WDWN in NAD Gait: Normal HEENT: WNL Eyes: Pupils equal Pulmonary: normal non-labored breathing , without Rales, rhonchi,  wheezing Cardiac: RRR, without  Murmurs, rubs or  gallops; No carotid bruits Abdomen: soft, NT, no masses Skin: no rashes, ulcers noted Vascular Exam/Pulses: Lower extremity pulses are palpable but dampened on the right, I do not palpate PT or DP on the left, femoral pulses are palpable but dampened, there is no abdominal mass palpated  Extremities without ischemic changes, no Gangrene , no cellulitis; no open wounds;  Musculoskeletal: no muscle wasting or atrophy  Neurologic: A&O X 3; Appropriate Affect ; SENSATION: normal; MOTOR FUNCTION:  moving all extremities equally. Speech is fluent/normal  Non-Invasive Vascular Imaging: ABIs today are 1.05 and triphasic to biphasic on the right, he is 0.83 and biphasic on the left  ASSESSMENT/PLAN: Asymptomatic AAA, mild claudication with no rest pain, possible lumbar stenosis causing radiculopathy. The patient will followup in one year with repeat AAA duplex and ABIs. The patient does decline any further PVD workup. The patient's questions were encouraged and answered, he is in agreement with this plan.  Lauree Chandler ANP  Clinic M.D.: [facility]    AAA 3 cm in 05/2011.  Otherwise details as above  Fabienne Bruns, MD [facility] Office: [phone] Pager: [phone]

## 2012-01-16 NOTE — Addendum Note (Signed)
Addended by: Sharee Pimple on: 01/16/2012 02:17 PM   Modules accepted: Orders

## 2012-01-27 ENCOUNTER — Ambulatory Visit (INDEPENDENT_AMBULATORY_CARE_PROVIDER_SITE_OTHER): Payer: Managed Care, Other (non HMO) | Admitting: Family Medicine

## 2012-01-27 ENCOUNTER — Encounter: Payer: Self-pay | Admitting: Family Medicine

## 2012-01-27 VITALS — BP 117/68 | HR 60 | Temp 98.7°F | Ht 76.0 in | Wt 217.0 lb

## 2012-01-27 DIAGNOSIS — R6889 Other general symptoms and signs: Secondary | ICD-10-CM

## 2012-01-27 DIAGNOSIS — J111 Influenza due to unidentified influenza virus with other respiratory manifestations: Secondary | ICD-10-CM

## 2012-01-27 NOTE — Patient Instructions (Addendum)
Be sure to rest and get plenty of fluids For your constipation you can try miralax or senna  Influenza Facts Flu (influenza) is a contagious respiratory illness caused by the influenza viruses. It can cause mild to severe illness. While most healthy people recover from the flu without specific treatment and without complications, older people, young children, and people with certain health conditions are at higher risk for serious complications from the flu, including death. CAUSES   The flu virus is spread from person to person by respiratory droplets from coughing and sneezing.  A person can also become infected by touching an object or surface with a virus on it and then touching their mouth, eye or nose.  Adults may be able to infect others from 1 day before symptoms occur and up to 7 days after getting sick. So it is possible to give someone the flu even before you know you are sick and continue to infect others while you are sick. SYMPTOMS   Fever (usually high).  Headache.  Tiredness (can be extreme).  Cough.  Sore throat.  Runny or stuffy nose.  Body aches.  Diarrhea and vomiting may also occur, particularly in children.  These symptoms are referred to as "flu-like symptoms". A lot of different illnesses, including the common cold, can have similar symptoms. DIAGNOSIS   There are tests that can determine if you have the flu as long you are tested within the first 2 or 3 days of illness.  A doctor's exam and additional tests may be needed to identify if you have a disease that is a complicating the flu. RISKS AND COMPLICATIONS  Some of the complications caused by the flu include:  Bacterial pneumonia or progressive pneumonia caused by the flu virus.  Loss of body fluids (dehydration).  Worsening of chronic medical conditions, such as heart failure, asthma, or diabetes.  Sinus problems and ear infections. HOME CARE INSTRUCTIONS   Seek medical care early on.  If  you are at high risk from complications of the flu, consult your health-care provider as soon as you develop flu-like symptoms. Those at high risk for complications include:  People 65 years or older.  People with chronic medical conditions, including diabetes.  Pregnant women.  Young children.  Your caregiver may recommend use of an antiviral medication to help treat the flu.  If you get the flu, get plenty of rest, drink a lot of liquids, and avoid using alcohol and tobacco.  You can take over-the-counter medications to relieve the symptoms of the flu if your caregiver approves. (Never give aspirin to children or teenagers who have flu-like symptoms, particularly fever). PREVENTION  The single best way to prevent the flu is to get a flu vaccine each fall. Other measures that can help protect against the flu are:  Antiviral Medications  A number of antiviral drugs are approved for use in preventing the flu. These are prescription medications, and a doctor should be consulted before they are used.  Habits for Good Health  Cover your nose and mouth with a tissue when you cough or sneeze, throw the tissue away after you use it.  Wash your hands often with soap and water, especially after you cough or sneeze. If you are not near water, use an alcohol-based hand cleaner.  Avoid people who are sick.  If you get the flu, stay home from work or school. Avoid contact with other people so that you do not make them sick, too.  Try not  to touch your eyes, nose, or mouth as germs ore often spread this way. IN CHILDREN, EMERGENCY WARNING SIGNS THAT NEED URGENT MEDICAL ATTENTION:  Fast breathing or trouble breathing.  Bluish skin color.  Not drinking enough fluids.  Not waking up or not interacting.  Being so irritable that the child does not want to be held.  Flu-like symptoms improve but then return with fever and worse cough.  Fever with a rash. IN ADULTS, EMERGENCY WARNING SIGNS  THAT NEED URGENT MEDICAL ATTENTION:  Difficulty breathing or shortness of breath.  Pain or pressure in the chest or abdomen.  Sudden dizziness.  Confusion.  Severe or persistent vomiting. SEEK IMMEDIATE MEDICAL CARE IF:  You or someone you know is experiencing any of the symptoms above. When you arrive at the emergency center,report that you think you have the flu. You may be asked to wear a mask and/or sit in a secluded area to protect others from getting sick. MAKE SURE YOU:   Understand these instructions.  Monitor your condition.  Seek medical care if you are getting worse, or not improving. Document Released: 01/17/2003 Document Revised: 04/08/2011 Document Reviewed: 10/13/2008 [facility] Patient Information 2013 [place], [place].

## 2012-01-29 DIAGNOSIS — R6889 Other general symptoms and signs: Secondary | ICD-10-CM | POA: Insufficient documentation

## 2012-01-29 NOTE — Progress Notes (Signed)
  Subjective:    Patient ID: Brian Burns, male    DOB: [birth year], 67 y.o.   MRN: [id number]  HPI  1.  Cough:  Patient here with c/o of cough, congestion, chills, body aches and mild nausea x5 days.  Has not checked temperature at home.  Denies shortness of breath, diarrhea, vomiting, chest pain.  He has had some mild constipation.  He did not receive a flu vaccine this year.  He is currently using an OTC cold and flu medications which does help some.   Review of Systems Per HPI    Objective:   Physical Exam  Constitutional: He appears well-nourished. No distress.  HENT:  Head: Normocephalic and atraumatic.       Mild posterior oropharyngeal erythema, no exudate.   Eyes: No scleral icterus.  Neck: Neck supple.  Cardiovascular: Normal rate, regular rhythm and normal heart sounds.   Pulmonary/Chest: Effort normal and breath sounds normal. No respiratory distress. He has no wheezes. He has no rales.  Abdominal: Soft. Bowel sounds are normal. He exhibits no distension.  Musculoskeletal: He exhibits no edema.  Neurological: He is alert.          Assessment & Plan:

## 2012-01-29 NOTE — Assessment & Plan Note (Signed)
Flu like symptoms, out of window for tamiflu.  Advised supportive treatment including rest, pushing fluids and otc medications prn.  Return if worsening or fails to improve.

## 2012-04-10 ENCOUNTER — Other Ambulatory Visit: Payer: Self-pay | Admitting: Family Medicine

## 2012-04-10 ENCOUNTER — Telehealth: Payer: Self-pay | Admitting: Family Medicine

## 2012-04-10 DIAGNOSIS — I10 Essential (primary) hypertension: Secondary | ICD-10-CM

## 2012-04-10 NOTE — Telephone Encounter (Signed)
Patient is calling for new Rx's  on Amlodipine and Tramadol to go to [facility] on [address].

## 2012-04-13 MED ORDER — TRAMADOL HCL 50 MG PO TABS: 50.0000 mg | ORAL_TABLET | Freq: Three times a day (TID) | ORAL | Status: DC | PRN

## 2012-04-13 MED ORDER — AMLODIPINE BESYLATE 10 MG PO TABS: 10.0000 mg | ORAL_TABLET | Freq: Every day | ORAL | Status: DC

## 2012-05-01 ENCOUNTER — Ambulatory Visit (INDEPENDENT_AMBULATORY_CARE_PROVIDER_SITE_OTHER): Payer: Medicare Other | Admitting: Family Medicine

## 2012-05-01 ENCOUNTER — Encounter: Payer: Self-pay | Admitting: Family Medicine

## 2012-05-01 VITALS — BP 132/89 | HR 62 | Temp 99.0°F | Wt 225.4 lb

## 2012-05-01 DIAGNOSIS — G5793 Unspecified mononeuropathy of bilateral lower limbs: Secondary | ICD-10-CM

## 2012-05-01 DIAGNOSIS — R350 Frequency of micturition: Secondary | ICD-10-CM

## 2012-05-01 DIAGNOSIS — I7389 Other specified peripheral vascular diseases: Secondary | ICD-10-CM

## 2012-05-01 DIAGNOSIS — G5790 Unspecified mononeuropathy of unspecified lower limb: Secondary | ICD-10-CM

## 2012-05-01 MED ORDER — TAMSULOSIN HCL 0.4 MG PO CAPS: 0.8000 mg | ORAL_CAPSULE | Freq: Every day | ORAL | Status: DC

## 2012-05-01 MED ORDER — GABAPENTIN 100 MG PO CAPS: 100.0000 mg | ORAL_CAPSULE | Freq: Three times a day (TID) | ORAL | Status: DC

## 2012-05-01 MED ORDER — TRAMADOL HCL 50 MG PO TABS: 50.0000 mg | ORAL_TABLET | Freq: Three times a day (TID) | ORAL | Status: DC | PRN

## 2012-05-01 NOTE — Patient Instructions (Signed)
I want you to start taking gabapentin.  Start taking one pill at night.  After 3 days, change to one pill at night and one in the morning.  After another 3 days, increase to taking one pill 3 times per day.  Once you have been taking one pill three times per day for a week, increase to 2 pills three times per day. I have sent in refills on your tramadol. I would like you to increase your dose of Flomax to 0.8mg  daily.

## 2012-05-10 DIAGNOSIS — G5793 Unspecified mononeuropathy of bilateral lower limbs: Secondary | ICD-10-CM | POA: Insufficient documentation

## 2012-05-10 NOTE — Progress Notes (Signed)
Patient ID: Brian Burns, male   DOB: [birth year], 67 y.o.   MRN: [id number] Subjective: The patient is a 67 y.o. year old male who presents today for f/u.  1. Leg pain: Patient describes two types of leg pain.  One is pain in lower legs that is made worse by walking and better by sitting.  He has had workup for vascular disease and is not felt to have sufficient disease at this time to have any intervention.  Patient also describes upper leg tingling and burning that is not related to activity.  Happens more at night.  Symptoms are not debilitating but do interfear with sleep.  No known back injuries at this time.  No bowel/bladder incontinance.  Does have some chronic low back pain with radicular symptoms.  2. BPH: Presumed due to increased frequency, weak stream, and trouble initiating.  On flowmax once daily.  Still with multiple nighttime awakenings.  Patient's past medical, social, and family history were reviewed and updated as appropriate. History  Substance Use Topics  . Smoking status: Former Smoker -- 0.50 packs/day for 40 years    Types: Cigarettes    Quit date: 01/29/2004  . Smokeless tobacco: Never Used  . Alcohol Use: No   Objective:  Filed Vitals:   05/01/12 1544  BP: 132/89  Pulse: 62  Temp: 99 F (37.2 C)   Gen: NAD CV: RRR Resp: CTABL Back: No specific tenderness or deformity Ext: Reflexes 1+ and symmetric, strength 5/5  Assessment/Plan:  Please also see individual problems in problem list for problem-specific plans.

## 2012-05-10 NOTE — Assessment & Plan Note (Signed)
Discussed medications such as pletal.  Patient not interested at this time due to cost concerns and symptoms not terribly affecting life.

## 2012-05-10 NOTE — Assessment & Plan Note (Signed)
Increase flomax to twice daily (target dose).  Does report some improvement with initiating of once daily flomax

## 2012-05-10 NOTE — Assessment & Plan Note (Signed)
Most likiely related to low back pathology.  Not currently bad enough or with concerning enough symptoms to warrant imaging in absence of acute event.  Start gabapentin per PI.

## 2012-06-08 ENCOUNTER — Other Ambulatory Visit: Payer: Self-pay

## 2012-06-08 ENCOUNTER — Emergency Department (HOSPITAL_COMMUNITY)
Admission: EM | Admit: 2012-06-08 | Discharge: 2012-06-08 | Disposition: A | Discharge status: Home / Self Care | Payer: Medicare Other | Attending: Emergency Medicine | Admitting: Emergency Medicine

## 2012-06-08 ENCOUNTER — Encounter (HOSPITAL_COMMUNITY): Payer: Self-pay | Admitting: *Deleted

## 2012-06-08 DIAGNOSIS — M549 Dorsalgia, unspecified: Secondary | ICD-10-CM | POA: Insufficient documentation

## 2012-06-08 DIAGNOSIS — R209 Unspecified disturbances of skin sensation: Secondary | ICD-10-CM | POA: Insufficient documentation

## 2012-06-08 DIAGNOSIS — I779 Disorder of arteries and arterioles, unspecified: Secondary | ICD-10-CM | POA: Insufficient documentation

## 2012-06-08 DIAGNOSIS — G8929 Other chronic pain: Secondary | ICD-10-CM | POA: Insufficient documentation

## 2012-06-08 DIAGNOSIS — E785 Hyperlipidemia, unspecified: Secondary | ICD-10-CM | POA: Insufficient documentation

## 2012-06-08 DIAGNOSIS — Z8673 Personal history of transient ischemic attack (TIA), and cerebral infarction without residual deficits: Secondary | ICD-10-CM | POA: Insufficient documentation

## 2012-06-08 DIAGNOSIS — R6883 Chills (without fever): Secondary | ICD-10-CM | POA: Insufficient documentation

## 2012-06-08 DIAGNOSIS — N529 Male erectile dysfunction, unspecified: Secondary | ICD-10-CM | POA: Insufficient documentation

## 2012-06-08 DIAGNOSIS — Z79899 Other long term (current) drug therapy: Secondary | ICD-10-CM | POA: Insufficient documentation

## 2012-06-08 DIAGNOSIS — I739 Peripheral vascular disease, unspecified: Secondary | ICD-10-CM | POA: Insufficient documentation

## 2012-06-08 DIAGNOSIS — Z87891 Personal history of nicotine dependence: Secondary | ICD-10-CM | POA: Insufficient documentation

## 2012-06-08 DIAGNOSIS — I2510 Atherosclerotic heart disease of native coronary artery without angina pectoris: Secondary | ICD-10-CM | POA: Insufficient documentation

## 2012-06-08 DIAGNOSIS — I10 Essential (primary) hypertension: Secondary | ICD-10-CM | POA: Insufficient documentation

## 2012-06-08 LAB — GLUCOSE, CAPILLARY
Glucose-Capillary: 101 mg/dL — ABNORMAL HIGH (ref 70–99)
Glucose-Capillary: 103 mg/dL — ABNORMAL HIGH (ref 70–99)

## 2012-06-08 LAB — BASIC METABOLIC PANEL
Chloride: 102 mEq/L (ref 96–112)
GFR calc non Af Amer: 47 mL/min — ABNORMAL LOW (ref >90)
Glucose, Bld: 99 mg/dL (ref 70–99)
Potassium: 3.8 mEq/L (ref 3.5–5.1)
Sodium: 135 mEq/L (ref 135–145)

## 2012-06-08 LAB — CBC WITH DIFFERENTIAL/PLATELET
Eosinophils Absolute: 0 10*3/uL (ref 0.0–0.7)
Hemoglobin: 11.7 g/dL — ABNORMAL LOW (ref 13.0–17.0)
Lymphocytes Relative: 10 % — ABNORMAL LOW (ref 12–46)
Lymphs Abs: 0.7 10*3/uL (ref 0.7–4.0)
MCH: 28.1 pg (ref 26.0–34.0)
Monocytes Relative: 6 % (ref 3–12)
Neutro Abs: 6.1 10*3/uL (ref 1.7–7.7)
Neutrophils Relative %: 83 % — ABNORMAL HIGH (ref 43–77)
Platelets: 122 10*3/uL — ABNORMAL LOW (ref 150–400)
RBC: 4.16 MIL/uL — ABNORMAL LOW (ref 4.22–5.81)
WBC: 7.3 10*3/uL (ref 4.0–10.5)

## 2012-06-08 LAB — URINALYSIS, ROUTINE W REFLEX MICROSCOPIC
Nitrite: NEGATIVE
Specific Gravity, Urine: 1.035 — ABNORMAL HIGH (ref 1.005–1.030)
Urobilinogen, UA: 1 mg/dL (ref 0.0–1.0)
pH: 5 (ref 5.0–8.0)

## 2012-06-08 LAB — URINE MICROSCOPIC-ADD ON

## 2012-06-08 NOTE — ED Provider Notes (Signed)
Medical screening examination/treatment/procedure(s) were performed by non-physician practitioner and as supervising physician I was immediately available for consultation/collaboration.   Rolan Bucco, MD 06/08/12 1944

## 2012-06-08 NOTE — ED Notes (Signed)
Urine specimen sent to lab at this time.

## 2012-06-08 NOTE — ED Notes (Signed)
Pt states he has little headache and symptoms started 45 minutes ago

## 2012-06-08 NOTE — ED Notes (Signed)
Pt states was fine and drank a cold Dr. Reino Kent and then got cold all over.  Then patient went in and laid down and then both hands started getting numbness to both hands and has numbness to mid right arm.  No sob or chest pain.

## 2012-06-08 NOTE — ED Notes (Signed)
Pt's CBG is 103 mg/dl

## 2012-06-08 NOTE — ED Notes (Signed)
PA at bedside.

## 2012-06-08 NOTE — ED Notes (Signed)
Pt unable to void at this time. 

## 2012-06-08 NOTE — ED Provider Notes (Signed)
History     CSN: [id number]  Arrival date & time 06/08/12  1247   First MD Initiated Contact with Patient 06/08/12 1356      No chief complaint on file.   (Consider location/radiation/quality/duration/timing/severity/associated sxs/prior treatment) HPI Comments: Patient is a 67 year old male with a past medical history of hypertension, CAD and previous stroke who presents with sudden onset of chills earlier today. Patient reports drinking a cold Dr. Reino Kent with ice and suddenly had chills all over his body which resolved spontaneously. Patient also reports sudden onset of localized right antecubital numbness that did not radiate. The numbness lasted a few minutes before spontaneously resolving. Patient is asymptomatic at this time. No aggravating/alleviating factors. Patient denies chest pain, SOB, unilateral weakness, syncope, dizziness.    Past Medical History  Diagnosis Date  . Hypertension   . HLD (hyperlipidemia)   . Erectile dysfunction   . Chronic back pain   . CAD (coronary artery disease)   . Carotid artery disease   . PVD (peripheral vascular disease) with claudication   . CVA (cerebrovascular accident) 5/09    Rt sided  . Stroke 01/06/2012    History reviewed. No pertinent past surgical history.  Family History  Problem Relation Age of Onset  . Lung disease Brother   . Kidney disease Brother   . Heart disease Brother     died at 86  . Heart disease Mother   . Kidney disease Mother   . Lung disease Mother     History  Substance Use Topics  . Smoking status: Former Smoker -- 0.50 packs/day for 40 years    Types: Cigarettes    Quit date: 01/29/2004  . Smokeless tobacco: Never Used  . Alcohol Use: No      Review of Systems  Constitutional: Positive for chills.  Neurological: Positive for numbness.  All other systems reviewed and are negative.    Allergies  Novocain  Home Medications   Current Outpatient Rx  Name  Route  Sig  Dispense  Refill   . acetaminophen (TYLENOL) 325 MG tablet   Oral   Take 2 tablets (650 mg total) by mouth every 6 (six) hours as needed for pain or fever.         Marland Kitchen amLODipine (NORVASC) 10 MG tablet   Oral   Take 1 tablet (10 mg total) by mouth daily.   90 tablet   1   . atorvastatin (LIPITOR) 40 MG tablet   Oral   Take 1 tablet (40 mg total) by mouth daily.   90 tablet   1   . dipyridamole-aspirin (AGGRENOX) 200-25 MG per 12 hr capsule   Oral   Take 1 capsule by mouth 2 (two) times daily.   60 capsule   0   . gabapentin (NEURONTIN) 100 MG capsule   Oral   Take 1 capsule (100 mg total) by mouth 3 (three) times daily.   180 capsule   1   . lisinopril (PRINIVIL,ZESTRIL) 10 MG tablet   Oral   Take 1 tablet (10 mg total) by mouth daily.   30 tablet   3   . tamsulosin (FLOMAX) 0.4 MG CAPS   Oral   Take 2 capsules (0.8 mg total) by mouth daily. In the AM   60 capsule   3   . traMADol (ULTRAM) 50 MG tablet   Oral   Take 1 tablet (50 mg total) by mouth every 8 (eight) hours as needed for pain.  90 tablet   2     BP 124/75  Pulse 81  Temp(Src) 98.1 F (36.7 C) (Oral)  Resp 14  Physical Exam  Nursing note and vitals reviewed. Constitutional: He is oriented to person, place, and time. He appears well-developed and well-nourished. No distress.  HENT:  Head: Normocephalic and atraumatic.  Mouth/Throat: Oropharynx is clear and moist. No oropharyngeal exudate.  Eyes: Conjunctivae and EOM are normal. Pupils are equal, round, and reactive to light. No scleral icterus.  Neck: Normal range of motion.  Cardiovascular: Normal rate and regular rhythm.  Exam reveals no gallop and no friction rub.   No murmur heard. Pulmonary/Chest: Effort normal and breath sounds normal. He has no wheezes. He has no rales. He exhibits no tenderness.  Abdominal: Soft. He exhibits no distension. There is no tenderness. There is no rebound and no guarding.  Musculoskeletal: Normal range of motion.   Neurological: He is alert and oriented to person, place, and time. Coordination normal.  Extremity strength and sensation equal and intact bilaterally. Speech is goal-oriented. Moves limbs without ataxia.   Skin: Skin is warm and dry.  Psychiatric: He has a normal mood and affect. His behavior is normal.    ED Course  Procedures (including critical care time)   Date: 06/08/2012  Rate: 81  Rhythm: normal sinus rhythm  QRS Axis: normal  Intervals: normal  ST/T Wave abnormalities: nonspecific T wave changes  Conduction Disutrbances:none  Narrative Interpretation: NSR with nonspecific T wave changes  Old EKG Reviewed: none available    Labs Reviewed  GLUCOSE, CAPILLARY - Abnormal; Notable for the following:    Glucose-Capillary 101 (*)    All other components within normal limits  CBC WITH DIFFERENTIAL - Abnormal; Notable for the following:    RBC 4.16 (*)    Hemoglobin 11.7 (*)    HCT 34.6 (*)    Platelets 122 (*)    Neutrophils Relative 83 (*)    Lymphocytes Relative 10 (*)    All other components within normal limits  BASIC METABOLIC PANEL - Abnormal; Notable for the following:    Creatinine, Ser 1.50 (*)    GFR calc non Af Amer 47 (*)    GFR calc Af Amer 55 (*)    All other components within normal limits  GLUCOSE, CAPILLARY - Abnormal; Notable for the following:    Glucose-Capillary 103 (*)    All other components within normal limits  URINALYSIS, ROUTINE W REFLEX MICROSCOPIC   No results found.   1. Chills       MDM  2:23 PM Labs and urinalysis pending. Vitals signs stable and patient afebrile. No neuro deficits.   4:01 PM Labs unremarkable. Patient will be discharged with instructions to follow up with his PCP for further evaluation. Vitals stable and patient is afebrile.       Emilia Beck, PA-C 06/08/12 1636

## 2012-06-09 ENCOUNTER — Telehealth: Payer: Self-pay | Admitting: Vascular Surgery

## 2012-06-09 NOTE — Telephone Encounter (Signed)
Per verbal from [place], this is fine, dpm

## 2012-06-09 NOTE — Telephone Encounter (Signed)
Received a call from Mr Brian Burns this morning- he is experiencing leg aches again in both legs, while walking and at rest. He wants to be seen sooner. He has an appointment in December for ABIs and NP- I moved that appointment up to 06/18/12 with [facility]. Is that appropriate?

## 2012-06-10 ENCOUNTER — Encounter: Payer: Self-pay | Admitting: Family Medicine

## 2012-06-10 ENCOUNTER — Ambulatory Visit (INDEPENDENT_AMBULATORY_CARE_PROVIDER_SITE_OTHER): Payer: Medicare Other | Admitting: Family Medicine

## 2012-06-10 VITALS — BP 147/86 | HR 48 | Temp 97.6°F | Ht 76.0 in | Wt 226.0 lb

## 2012-06-10 DIAGNOSIS — R319 Hematuria, unspecified: Secondary | ICD-10-CM

## 2012-06-10 DIAGNOSIS — R300 Dysuria: Secondary | ICD-10-CM

## 2012-06-10 DIAGNOSIS — N400 Enlarged prostate without lower urinary tract symptoms: Secondary | ICD-10-CM

## 2012-06-10 LAB — POCT UA - MICROSCOPIC ONLY

## 2012-06-10 LAB — POCT URINALYSIS DIPSTICK
Blood, UA: NEGATIVE
Glucose, UA: NEGATIVE
Nitrite, UA: NEGATIVE
Spec Grav, UA: 1.025
Urobilinogen, UA: 1
pH, UA: 6

## 2012-06-10 NOTE — Patient Instructions (Addendum)
You may continue your flomax, I think the blood may be coming from your prostate If you notice that this continues please come back I would like for you to follow up with Dr. Louanne Belton in 4-6 weeks  Hematuria, Adult Hematuria (blood in your urine) can be caused by a bladder infection (cystitis), kidney infection (pyelonephritis), prostate infection (prostatitis), or kidney stone. Infections will usually respond to antibiotics (medications which kill germs), and a kidney stone will usually pass through your urine without further treatment. If you were put on antibiotics, take all the medicine until gone. You may feel better in a few days, but take all of your medicine or the infection may not respond and become more difficult to treat. If antibiotics were not given, an infection did not cause the blood in the urine. A further work up to find out the reason may be needed. HOME CARE INSTRUCTIONS   Drink lots of fluid, 3 to 4 quarts a day. If you have been diagnosed with an infection, cranberry juice is especially recommended, in addition to large amounts of water.  Avoid caffeine, tea, and carbonated beverages, because they tend to irritate the bladder.  Avoid alcohol as it may irritate the prostate.  Only take over-the-counter or prescription medicines for pain, discomfort, or fever as directed by your caregiver.  If you have been diagnosed with a kidney stone follow your caregivers instructions regarding straining your urine to catch the stone. TO PREVENT FURTHER INFECTIONS:  Empty the bladder often. Avoid holding urine for long periods of time.  After a bowel movement, women should cleanse front to back. Use each tissue only once.  Empty the bladder before and after sexual intercourse if you are a male.  Return to your caregiver if you develop back pain, fever, nausea (feeling sick to your stomach), vomiting, or your symptoms (problems) are not better in 3 days. Return sooner if you are  getting worse. If you have been requested to return for further testing make sure to keep your appointments. If an infection is not the cause of blood in your urine, X-rays may be required. Your caregiver will discuss this with you. SEEK IMMEDIATE MEDICAL CARE IF:   You have a persistent fever over 102 F (38.9 C).  You develop severe vomiting and are unable to keep the medication down.  You develop severe back or abdominal pain despite taking your medications.  You begin passing a large amount of blood or clots in your urine.  You feel extremely weak or faint, or pass out. MAKE SURE YOU:   Understand these instructions.  Will watch your condition.  Will get help right away if you are not doing well or get worse. Document Released: 01/14/2005 Document Revised: 04/08/2011 Document Reviewed: 09/03/2007 [facility] Patient Information 2013 [place], [place].

## 2012-06-11 DIAGNOSIS — R319 Hematuria, unspecified: Secondary | ICD-10-CM | POA: Insufficient documentation

## 2012-06-11 NOTE — Assessment & Plan Note (Signed)
UA does not seem to indicate UTI, bleeding likely from prostatic from BPH.  I do not see a recent PSA in his chart, will check this today.  Symptoms have resolved at this time, instructed to return if this occurs again.  May benefit from finasteride or dutasteride.

## 2012-06-11 NOTE — Progress Notes (Signed)
  Subjective:    Patient ID: Brian Burns, male    DOB: Jan 17, 1946, 67 y.o.   MRN: [id number]  Dysuria  Associated symptoms include hematuria.  Hematuria Associated symptoms include dysuria.    1. Hematuria:  Reports noticing blood in his urine 2 days ago after using flomax.  Also had a "stinging" sensation with urination.  All of this has since resolved.  He has a history of BPH.  Denies fever, chills, flank pain, urinary retention or difficulty urinating.       Review of Systems  Genitourinary: Positive for dysuria and hematuria.   Per HPI    Objective:   Physical Exam  Constitutional: He appears well-nourished. No distress.  Abdominal: Soft. He exhibits no distension. There is no tenderness.  Neurological: He is alert.          Assessment & Plan:

## 2012-06-12 ENCOUNTER — Telehealth: Payer: Self-pay | Admitting: *Deleted

## 2012-06-12 NOTE — Telephone Encounter (Signed)
Called pt and informed. .Brian Burns  

## 2012-06-12 NOTE — Telephone Encounter (Signed)
Message copied by Arlyss Repress on Fri Jun 12, 2012  2:58 PM ------      Message from: Everrett Coombe      Created: Fri Jun 12, 2012  2:13 PM       PSA normal, can follow up with PCP if has recurrence of hematuria ------

## 2012-06-17 ENCOUNTER — Encounter: Payer: Self-pay | Admitting: Vascular Surgery

## 2012-06-18 ENCOUNTER — Ambulatory Visit (INDEPENDENT_AMBULATORY_CARE_PROVIDER_SITE_OTHER): Payer: Medicare Other | Admitting: Vascular Surgery

## 2012-06-18 ENCOUNTER — Encounter: Payer: Self-pay | Admitting: Vascular Surgery

## 2012-06-18 ENCOUNTER — Encounter (INDEPENDENT_AMBULATORY_CARE_PROVIDER_SITE_OTHER): Payer: Medicare Other | Admitting: Vascular Surgery

## 2012-06-18 VITALS — BP 109/73 | HR 52 | Resp 16 | Ht 76.0 in | Wt 227.0 lb

## 2012-06-18 DIAGNOSIS — I714 Abdominal aortic aneurysm, without rupture: Secondary | ICD-10-CM

## 2012-06-18 DIAGNOSIS — I739 Peripheral vascular disease, unspecified: Secondary | ICD-10-CM

## 2012-06-18 DIAGNOSIS — I724 Aneurysm of artery of lower extremity: Secondary | ICD-10-CM

## 2012-06-18 DIAGNOSIS — M549 Dorsalgia, unspecified: Secondary | ICD-10-CM

## 2012-06-18 DIAGNOSIS — M25559 Pain in unspecified hip: Secondary | ICD-10-CM

## 2012-06-18 NOTE — Progress Notes (Signed)
[facility] [place] [facility]   CC:  Both thighs are painful  Burns, [name], MD  HPI: This is a 67 y.o. male  Who presents today with complaints of bilateral thigh pain.  He states that it will ease off with rest, but is constant.  One leg is not any worse than the other and nothing really makes it any better.  The pain is indiscriminate and occurs whether he is ambulating or sitting. He states that he also has some lower back pain that eases a little with laying down, otherwise, nothing alleviates the pain.  He was seen in our office in December with ABI's and duplex of AAA.  His ABI's at that time were 0.91 on the right and 0.76 on the left.  The duplex of AAA revealed a size of 3.1cm.  He does not smoke and has a remote tobacco history.  He does have hx of CVA x 2 in 2010 and 2013 with no residual. His symptoms are very similar to his previous office visit in June of 2013. He does not describe rest pain. He is in the wound on his feet.  Past Medical History  Diagnosis Date  . Hypertension   . HLD (hyperlipidemia)   . Erectile dysfunction   . Chronic back pain   . CAD (coronary artery disease)   . Carotid artery disease   . PVD (peripheral vascular disease) with claudication   . CVA (cerebrovascular accident) 5/09    Rt sided  . Stroke 01/06/2012   Past Surgical History  Procedure Laterality Date  . Eye surgery Left April 2014    Cataract  . Eye surgery Right Jun 16, 2012    Cataract by Dr. Elmer Picker    Allergies  Allergen Reactions  . Novocain (Procaine Hcl)     Current Outpatient Prescriptions  Medication Sig Dispense Refill  . acetaminophen (TYLENOL) 325 MG tablet Take 2 tablets (650 mg total) by mouth every 6 (six) hours as needed for pain or fever.      Marland Kitchen amLODipine (NORVASC) 10 MG tablet Take 1 tablet (10 mg total) by mouth daily.  90 tablet  1  . atorvastatin (LIPITOR) 40 MG tablet Take 1 tablet (40 mg total) by mouth daily.  90 tablet  1   . brimonidine (ALPHAGAN) 0.15 % ophthalmic solution Place into the left eye 4 (four) times daily.      Marland Kitchen gabapentin (NEURONTIN) 100 MG capsule Take 1 capsule (100 mg total) by mouth 3 (three) times daily.  180 capsule  1  . ketorolac (ACULAR) 0.4 % SOLN Place into the left eye 4 (four) times daily.      Marland Kitchen latanoprost (XALATAN) 0.005 % ophthalmic solution Place 1 drop into the left eye 4 (four) times daily.      Marland Kitchen lisinopril (PRINIVIL,ZESTRIL) 10 MG tablet Take 1 tablet (10 mg total) by mouth daily.  30 tablet  3  . ofloxacin (OCUFLOX) 0.3 % ophthalmic solution Place 1 drop into the left eye 4 (four) times daily.      . prednisoLONE acetate (PRED FORTE) 1 % ophthalmic suspension Place 1 drop into the left eye 4 (four) times daily.      . tamsulosin (FLOMAX) 0.4 MG CAPS Take 0.4 mg by mouth 2 (two) times daily.       . traMADol (ULTRAM) 50 MG tablet Take 1 tablet (50 mg total) by mouth every 8 (eight) hours as needed for pain.  90 tablet  2   No current facility-administered medications for this visit.    Family History  Problem Relation Age of Onset  . Lung disease Brother   . Kidney disease Brother   . Heart disease Brother     died at 41  . Heart disease Mother   . Kidney disease Mother   . Lung disease Mother     History   Social History  . Marital Status: Married    Spouse Name: N/A    Number of Children: 2   . Years of Education: N/A   Occupational History  .  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 [employer]     on the weekends   Social History Main Topics  . Smoking status: Former Smoker -- 0.50 packs/day for 40 years    Types: Cigarettes    Quit date: 01/29/2004  . Smokeless tobacco: Never Used  . Alcohol Use: No  . Drug Use: No  . Sexually Active: Not on file   Other Topics Concern  . Not on file   Social History Narrative  . No narrative on file     ROS: [x]  Positive   [ ]  Negative   [ ]  All sytems reviewed and are negative  General: [ ]   Weight loss, [ ]  Fever, [ ]  chills Neurologic: [ ]  Dizziness, [ ]  Blackouts, [ ]  Seizure [x ] Stroke, [ ]  "Mini stroke", [ ]  Slurred speech, [ ]  Temporary blindness; [ ]  weakness in arms or legs, [ ]  Hoarseness Cardiac: [ ]  Chest pain/pressure, [ ]  Shortness of breath at rest [x ] Shortness of breath with exertion, [ ]  Atrial fibrillation or irregular heartbeat Vascular: [x ] Pain in legs with walking, [x ] Pain in legs at rest, [ ]  Pain in legs at night,  [ ]  Non-healing ulcer, [ ]  Blood clot in vein/DVT,   Pulmonary: [ ]  Home oxygen, [ ]  Productive cough, [ ]  Coughing up blood, [ ]  Asthma,  [ ]  Wheezing Musculoskeletal:  [ ]  Arthritis, [x ] Low back pain, [ ]  Joint pain Hematologic: [ ]  Easy Bruising, [ ]  Anemia; [ ]  Hepatitis Gastrointestinal: [ ]  Blood in stool, [ ]  Gastroesophageal Reflux/heartburn, [ ]  Trouble swallowing Urinary: [ ]  chronic Kidney disease, [ ]  on HD - [ ]  MWF or [ ]  TTHS, [ ]  Burning with urination, [ ]  Difficulty urinating Endocrine: [ ]  hx of diabetes, [ ]  hx of thyroid disease Skin: [ ]  Rashes, [ ]  Wounds Psychological: [ ]  Anxiety, [ ]  Depression    PHYSICAL EXAMINATION:  Filed Vitals:   06/18/12 1428  BP: 109/73  Pulse: 52  Resp: 16   Body mass index is 27.64 kg/(m^2).  General:  WDWN in NAD Gait: Normal HENT: WNL Pulmonary: normal non-labored breathing , without Rales, rhonchi,  wheezing Cardiac: RRR, without  Murmurs, rubs or gallops Abdomen: soft, NT, no masses; Skin: no rashes, ulcers noted Vascular Exam/Pulses: + palpable femoral pulses bilaterally.  He does have a palpable right DP pulse; I was unable to palpate the left DP, but foot is warm and well perfused. Could feel the pulse of aorta, but could not appreciate a pulsatile mass in his abdomen. Extremities: without ischemic changes, no Gangrene , no cellulitis; no open wounds;  Musculoskeletal: no muscle wasting or atrophy  Neurologic: A&O X 3; Appropriate Affect ; SENSATION: normal; MOTOR  FUNCTION:  moving all extremities equally. Speech is fluent/normal   Non-Invasive Vascular Imaging:  ABI's 06/18/12 Right:  .97 Left:  .75  CBC    Component Value Date/Time   WBC 7.3 06/08/2012 1434   RBC 4.16* 06/08/2012 1434   HGB 11.7* 06/08/2012 1434   HCT 34.6* 06/08/2012 1434   PLT 122* 06/08/2012 1434   MCV 83.2 06/08/2012 1434   MCH 28.1 06/08/2012 1434   MCHC 33.8 06/08/2012 1434   RDW 14.5 06/08/2012 1434   LYMPHSABS 0.7 06/08/2012 1434   MONOABS 0.5 06/08/2012 1434   EOSABS 0.0 06/08/2012 1434   BASOSABS 0.0 06/08/2012 1434    BMET    Component Value Date/Time   NA 135 06/08/2012 1434   K 3.8 06/08/2012 1434   CL 102 06/08/2012 1434   CO2 24 06/08/2012 1434   GLUCOSE 99 06/08/2012 1434   BUN 18 06/08/2012 1434   CREATININE 1.50* 06/08/2012 1434   CREATININE 1.06 12/10/2011 0952   CALCIUM 9.1 06/08/2012 1434   GFRNONAA 47* 06/08/2012 1434   GFRAA 55* 06/08/2012 1434     ASSESSMENT/PLAN: 67 y.o. male with complaints of bilateral thigh pain and lower back pain.  His ABI's are essentially unchanged from his ABI's in December.  The pain he describes is not consistent with claudication.  -will have him f/u with his primary care MD to investigate the pain of his lower back, which could be contributing to his bilateral leg pain. -we will have him f/u in 1 year with duplex of AAA and ABI's.   Doreatha Massed, PA-C [facility] [phone]  Clinic MD:  Pt seen and examined in conjunction with Dr. Darrick Penna  Patient seen and examined history and exam details as above. The patient has a small aortic aneurysm approximately 3 cm. He has bilateral leg pain but does sound more like pseudo-claudication and claudication. His ABIs are reasonable he does not really describe claudication symptoms. He certainly does not have limb threatening ischemia. I believe conservative management with continued walking program and observation would be best. He'll continue risk factor  modification to his primary care physician. He'll followup with [place] in one year for an aortic duplex as well as bilateral ABIs. He'll followup sooner if his symptoms worsen.  Fabienne Bruns, MD [facility] [place] Office: [phone] Pager: [phone]

## 2012-06-19 ENCOUNTER — Other Ambulatory Visit: Payer: Self-pay | Admitting: *Deleted

## 2012-06-19 DIAGNOSIS — I739 Peripheral vascular disease, unspecified: Secondary | ICD-10-CM

## 2012-06-19 DIAGNOSIS — I714 Abdominal aortic aneurysm, without rupture: Secondary | ICD-10-CM

## 2012-07-17 ENCOUNTER — Encounter: Payer: Self-pay | Admitting: Family Medicine

## 2012-07-17 ENCOUNTER — Ambulatory Visit (INDEPENDENT_AMBULATORY_CARE_PROVIDER_SITE_OTHER): Payer: Medicare Other | Admitting: Family Medicine

## 2012-07-17 VITALS — BP 139/89 | HR 59 | Temp 97.8°F | Ht 76.0 in | Wt 226.0 lb

## 2012-07-17 DIAGNOSIS — IMO0001 Reserved for inherently not codable concepts without codable children: Secondary | ICD-10-CM

## 2012-07-17 DIAGNOSIS — R319 Hematuria, unspecified: Secondary | ICD-10-CM

## 2012-07-17 DIAGNOSIS — M791 Myalgia, unspecified site: Secondary | ICD-10-CM

## 2012-07-17 DIAGNOSIS — R5383 Other fatigue: Secondary | ICD-10-CM

## 2012-07-17 DIAGNOSIS — R5381 Other malaise: Secondary | ICD-10-CM

## 2012-07-17 DIAGNOSIS — M7710 Lateral epicondylitis, unspecified elbow: Secondary | ICD-10-CM

## 2012-07-17 LAB — COMPREHENSIVE METABOLIC PANEL
Alkaline Phosphatase: 111 U/L (ref 39–117)
CO2: 24 mEq/L (ref 19–32)
Creat: 1.31 mg/dL (ref 0.50–1.35)
Glucose, Bld: 119 mg/dL — ABNORMAL HIGH (ref 70–99)
Total Bilirubin: 0.5 mg/dL (ref 0.3–1.2)

## 2012-07-17 LAB — T4, FREE: Free T4: 0.9 ng/dL (ref 0.80–1.80)

## 2012-07-17 LAB — TSH: TSH: 3.624 u[IU]/mL (ref 0.350–4.500)

## 2012-07-17 LAB — CK: Total CK: 412 U/L — ABNORMAL HIGH (ref 7–232)

## 2012-07-17 MED ORDER — TRAMADOL HCL 50 MG PO TABS: 50.0000 mg | ORAL_TABLET | Freq: Three times a day (TID) | ORAL | Status: DC | PRN

## 2012-07-17 MED ORDER — NITROGLYCERIN 0.2 MG/HR TD PT24: MEDICATED_PATCH | TRANSDERMAL | Status: DC

## 2012-07-17 NOTE — Assessment & Plan Note (Signed)
With report of recurrent hematuria I would like the patient to see urology for cystoscopy.

## 2012-07-17 NOTE — Patient Instructions (Addendum)
I want you to not take your Lipitor for the next month. I'm getting some blood work to investigate your aching legs and her fatigue. Make sure you're getting plenty of sleep and eating plenty of fruits and vegetables. Will get you to see urology because of the blood in your urine. Take tylenol 3 times per day every day. You can take 1 or 2 of the tramadol if you are hurting a lot.

## 2012-07-17 NOTE — Assessment & Plan Note (Signed)
Patient's description of his symptoms is more consistent with a myalgia and, since he is on a statin, I am more concerned about this being statin related. We will stop the statin for a month and see if he gets better. I will also obtain coverage metabolic panel and CK.

## 2012-07-17 NOTE — Assessment & Plan Note (Signed)
Of patient's exam does not exactly fit lateral epicondylitis, this is my best guess about what is happening. We will treat as if it is with nitroglycerin patches and relative rest. Patient to return to clinic in about a month.

## 2012-07-17 NOTE — Progress Notes (Signed)
Patient ID: Brian Burns, male   DOB: January 25, 1946, 67 y.o.   MRN: [id number] Subjective: The patient is a 67 y.o. year old male who presents today for multiple concerns.  1. Leg pain: Patient reports that he's been having gradually worsening pain in his legs. He initially attributed this to arthritis. He describes it as an aching pain is located in his thighs. It is made worse by exertion. He does not report any pain in his knees or his hips. He does not note any knee or hip swelling.  2. Hematuria: Patient reports repeated episodes of hematuria. He is not taking Flomax because he feels that it has caused. However, to stop his Flomax he has still had episodes of hematuria. He had at least one of these documented in office.  3. Right elbow pain: For about the last month and half patient has been having problems with pain on the lateral aspect of his right elbow. Pain is located over 80 lateral epicondyles made worse by picking up objects. Is not in significant swelling. There is no specific injury. Does not hurt if the patient is not using it.  Patient's past medical, social, and family history were reviewed and updated as appropriate. History  Substance Use Topics  . Smoking status: Former Smoker -- 0.50 packs/day for 40 years    Types: Cigarettes    Quit date: 01/29/2004  . Smokeless tobacco: Never Used  . Alcohol Use: No   Objective:  Filed Vitals:   07/17/12 1025  BP: 139/89  Pulse: 59  Temp: 97.8 F (36.6 C)   Gen: No acute distress Extremities: There is no swelling or point tenderness of the right elbow.No significant pain with pronation or supination. Patient identifies the point of maximal pain directly over the lateral epicondyle. Patient's knee exam is completely benign. He has mild crepitus on patellar pressure but no pain. He has no joint effusions. Joint capsule is intact and collateral ligament testing is normal.  Assessment/Plan:  Please also see individual problems in  problem list for problem-specific plans.

## 2012-07-18 LAB — VITAMIN D 25 HYDROXY (VIT D DEFICIENCY, FRACTURES): Vit D, 25-Hydroxy: 22 ng/mL — ABNORMAL LOW (ref 30–89)

## 2012-07-20 ENCOUNTER — Telehealth: Payer: Self-pay | Admitting: Family Medicine

## 2012-07-20 MED ORDER — VITAMIN D3 10 MCG (400 UNIT) PO TABS: 800.0000 [IU] | ORAL_TABLET | Freq: Every day | ORAL | Status: DC

## 2012-07-20 NOTE — Telephone Encounter (Signed)
Let patient know that he has low Vitamin D.  I want him to start taking some every day and have sent in a prescription.  This may help with his fatigue.  His other lab tests were normal.

## 2012-07-20 NOTE — Telephone Encounter (Signed)
Pt called and informed of vit d levels and prescription - verbalized understanding. Wyatt Haste, RN-BSN

## 2012-08-26 ENCOUNTER — Other Ambulatory Visit: Payer: Self-pay | Admitting: Family Medicine

## 2012-09-04 ENCOUNTER — Encounter: Payer: Self-pay | Admitting: Family Medicine

## 2012-09-04 ENCOUNTER — Ambulatory Visit (INDEPENDENT_AMBULATORY_CARE_PROVIDER_SITE_OTHER): Payer: Medicare Other | Admitting: Family Medicine

## 2012-09-04 VITALS — BP 151/83 | HR 52 | Temp 97.8°F | Wt 221.0 lb

## 2012-09-04 DIAGNOSIS — R059 Cough, unspecified: Secondary | ICD-10-CM

## 2012-09-04 DIAGNOSIS — R05 Cough: Secondary | ICD-10-CM | POA: Insufficient documentation

## 2012-09-04 MED ORDER — SALINE SPRAY 0.65 % NA SOLN: 2.0000 | NASAL | Status: DC | PRN

## 2012-09-04 MED ORDER — FLUTICASONE PROPIONATE 50 MCG/ACT NA SUSP: 2.0000 | Freq: Every day | NASAL | Status: DC

## 2012-09-04 MED ORDER — CETIRIZINE HCL 10 MG PO TABS: 10.0000 mg | ORAL_TABLET | Freq: Every day | ORAL | Status: DC

## 2012-09-04 MED ORDER — DEXTROMETHORPHAN-GUAIFENESIN 10-100 MG/5ML PO LIQD: 5.0000 mL | ORAL | Status: DC | PRN

## 2012-09-04 NOTE — Progress Notes (Signed)
Brian Burns is a 67 y.o. male who presents to [facility] today for SD for cough  "Cold in Chest". Started 2 wks ago. Runny nose only when sitting forward. Denies allergies, CP, SOB, HA, syncope. "Just a cough". Denies sick contacts. Mucinex cough medicine w/ some benefit. Wakes up at night coughing. No problems during the daytime. Non-productive.  \Started on Lisinopril last December .  The following portions of the patient's history were reviewed and updated as appropriate: allergies, current medications, past medical history, family and social history, and problem list.  Patient is a nonsmoker.  Past Medical History  Diagnosis Date  . Hypertension   . HLD (hyperlipidemia)   . Erectile dysfunction   . Chronic back pain   . CAD (coronary artery disease)   . Carotid artery disease   . PVD (peripheral vascular disease) with claudication   . CVA (cerebrovascular accident) 5/09    Rt sided  . Stroke 01/06/2012    ROS as above otherwise neg.    Medications reviewed. Current Outpatient Prescriptions  Medication Sig Dispense Refill  . acetaminophen (TYLENOL) 325 MG tablet Take 2 tablets (650 mg total) by mouth every 6 (six) hours as needed for pain or fever.      Marland Kitchen amLODipine (NORVASC) 10 MG tablet Take 1 tablet (10 mg total) by mouth daily.  90 tablet  1  . atorvastatin (LIPITOR) 40 MG tablet take 1 tablet by mouth once daily  90 tablet  1  . brimonidine (ALPHAGAN) 0.15 % ophthalmic solution Place into the left eye 4 (four) times daily.      . Cholecalciferol (VITAMIN D3) 400 UNITS tablet Take 2 tablets (800 Units total) by mouth daily.  60 tablet  3  . gabapentin (NEURONTIN) 100 MG capsule Take 1 capsule (100 mg total) by mouth 3 (three) times daily.  180 capsule  1  . ketorolac (ACULAR) 0.4 % SOLN Place into the left eye 4 (four) times daily.      Marland Kitchen latanoprost (XALATAN) 0.005 % ophthalmic solution Place 1 drop into the left eye 4 (four) times daily.      Marland Kitchen lisinopril (PRINIVIL,ZESTRIL) 10  MG tablet Take 1 tablet (10 mg total) by mouth daily.  30 tablet  3  . nitroGLYCERIN (NITRODUR - DOSED IN MG/24 HR) 0.2 mg/hr Place 1/4 patch on right elbow at site of pain daily  30 patch  0  . ofloxacin (OCUFLOX) 0.3 % ophthalmic solution Place 1 drop into the left eye 4 (four) times daily.      . prednisoLONE acetate (PRED FORTE) 1 % ophthalmic suspension Place 1 drop into the left eye 4 (four) times daily.      . tamsulosin (FLOMAX) 0.4 MG CAPS Take 0.4 mg by mouth 2 (two) times daily.       . traMADol (ULTRAM) 50 MG tablet Take 1-2 tablets (50-100 mg total) by mouth every 8 (eight) hours as needed for pain.  90 tablet  2   No current facility-administered medications for this visit.    Exam: BP 151/83  Pulse 52  Temp(Src) 97.8 F (36.6 C) (Oral)  Wt 221 lb (100.245 kg)  BMI 26.91 kg/m2 Gen: Well NAD HEENT: EOMI,  MMM, boggy nasal turbinates.  Lungs: CTABL Nl WOB Heart: RRR no MRG Abd: NABS, NT, ND Exts: Non edematous BL  LE, warm and well perfused.   No results found for this or any previous visit (from the past 72 hour(s)).

## 2012-09-04 NOTE — Assessment & Plan Note (Signed)
Likely post nasal drip Flonase, saline nasal spray, robitussin DM Zyrtec if needed (precautions given) If persists consider DC lisinopril

## 2012-09-04 NOTE — Patient Instructions (Addendum)
Your cough is from drainage from your nose You need to start using Flonase every night You may also try the Robitussin DM for your cough Please also start using the saline nasal spray several times a day Try zyrtec but this may make you tired and may decrease your ability to urinate, if this happens stop. Have a great weekend.

## 2012-09-25 ENCOUNTER — Other Ambulatory Visit: Payer: Self-pay | Admitting: Family Medicine

## 2012-09-30 NOTE — Telephone Encounter (Signed)
Rx printed and left at front desk. Please call pt to let them know to pick it up. Thanks! --CMS 

## 2012-10-07 ENCOUNTER — Other Ambulatory Visit: Payer: Self-pay | Admitting: Urology

## 2012-10-15 ENCOUNTER — Encounter (HOSPITAL_COMMUNITY): Payer: Self-pay | Admitting: Pharmacy Technician

## 2012-10-16 ENCOUNTER — Other Ambulatory Visit (HOSPITAL_COMMUNITY): Payer: Self-pay | Admitting: Urology

## 2012-10-16 NOTE — Patient Instructions (Addendum)
Brian Burns  10/16/2012   Your procedure is scheduled on: 10-27-2012  Report to [facility] at  730 AM.  Call this number if you have problems the morning of surgery [phone]   Remember:   Do not eat food or drink liquids :After Midnight.     Take these medicines the morning of surgery with A SIP OF WATER: amlodipine (norvasc), lipitor, gabapentin                             You may not have any metal on your body including hair pins and piercings  Do not wear jewelry, make-up.  Do not wear lotions, powders, or perfumes. You may wear deodorant.   Men may shave face and neck.  Do not bring valuables to the hospital. [facility] [facility] IS NOT RESPONSIBLE FOR VALUEABLES.  Contacts, dentures or bridgework may not be worn into surgery.    Patients discharged the day of surgery will not be allowed to drive home.  Name and phone number of your driver: Lynden Ang [phone]   Call Birdie Sons  RN pre op nurse if needed [phone]    FAILURE TO FOLLOW THESE INSTRUCTIONS MAY RESULT IN THE CANCELLATION OF YOUR SURGERY.  PATIENT SIGNATURE___________________________________________  NURSE SIGNATURE_____________________________________________

## 2012-10-16 NOTE — Progress Notes (Signed)
06-08-12 ekg epic 01-16-12 abdominal aortic duplex epic 01-07-12 chest 2 view xray epic

## 2012-10-19 ENCOUNTER — Encounter (HOSPITAL_COMMUNITY): Payer: Self-pay

## 2012-10-19 ENCOUNTER — Encounter (HOSPITAL_COMMUNITY)
Admission: RE | Admit: 2012-10-19 | Discharge: 2012-10-19 | Disposition: A | Discharge status: Home / Self Care | Payer: Medicare Other | Source: Ambulatory Visit | Attending: Urology | Admitting: Urology

## 2012-10-19 DIAGNOSIS — Z01812 Encounter for preprocedural laboratory examination: Secondary | ICD-10-CM | POA: Insufficient documentation

## 2012-10-19 DIAGNOSIS — Z01818 Encounter for other preprocedural examination: Secondary | ICD-10-CM | POA: Insufficient documentation

## 2012-10-19 HISTORY — DX: Gastro-esophageal reflux disease without esophagitis: K21.9

## 2012-10-19 HISTORY — DX: Unspecified osteoarthritis, unspecified site: M19.90

## 2012-10-19 LAB — CBC
HCT: 34.9 % — ABNORMAL LOW (ref 39.0–52.0)
Hemoglobin: 11.8 g/dL — ABNORMAL LOW (ref 13.0–17.0)
MCV: 84.3 fL (ref 78.0–100.0)
Platelets: 133 10*3/uL — ABNORMAL LOW (ref 150–400)
RBC: 4.14 MIL/uL — ABNORMAL LOW (ref 4.22–5.81)
WBC: 4.6 10*3/uL (ref 4.0–10.5)

## 2012-10-19 LAB — BASIC METABOLIC PANEL
CO2: 27 mEq/L (ref 19–32)
Chloride: 102 mEq/L (ref 96–112)
Glucose, Bld: 105 mg/dL — ABNORMAL HIGH (ref 70–99)
Sodium: 137 mEq/L (ref 135–145)

## 2012-10-23 ENCOUNTER — Other Ambulatory Visit: Payer: Self-pay | Admitting: Family Medicine

## 2012-10-26 NOTE — Telephone Encounter (Signed)
Covering box for Dr. Casper Harrison.   Refilled amlodipine with usual #90, but will need to be seen for HTN prior to future refills as this has been since 12/13 since it was addressed. Decline to refill tramadol as this is a controlled substance and he was given #90 with 2 refills less than 1 month ago.   He will need an appt with Dr. Casper Harrison if he requires that much trmadol.    Murtis Sink, MD [facility] Resident, PGY-2 10/26/2012, 2:20 PM

## 2012-10-27 ENCOUNTER — Encounter (HOSPITAL_COMMUNITY): Admission: RE | Payer: Self-pay | Source: Ambulatory Visit

## 2012-10-27 ENCOUNTER — Ambulatory Visit (HOSPITAL_COMMUNITY): Admission: RE | Admit: 2012-10-27 | Payer: Medicare Other | Source: Ambulatory Visit | Admitting: Urology

## 2012-10-27 SURGERY — CYSTOSCOPY, WITH URETHRAL DILATION
Anesthesia: General

## 2012-10-30 NOTE — Telephone Encounter (Signed)
Pt's wife is advised of med refill and to sched OV re:the tramadol.

## 2012-11-06 ENCOUNTER — Ambulatory Visit: Payer: Medicare Other | Admitting: Family Medicine

## 2012-11-11 ENCOUNTER — Encounter: Payer: Self-pay | Admitting: Family Medicine

## 2012-11-11 ENCOUNTER — Ambulatory Visit (INDEPENDENT_AMBULATORY_CARE_PROVIDER_SITE_OTHER): Payer: Medicare Other | Admitting: Family Medicine

## 2012-11-11 VITALS — BP 143/87 | HR 64 | Ht 76.0 in | Wt 222.0 lb

## 2012-11-11 DIAGNOSIS — M545 Low back pain: Secondary | ICD-10-CM

## 2012-11-11 MED ORDER — TRAMADOL HCL 50 MG PO TABS: 50.0000 mg | ORAL_TABLET | Freq: Three times a day (TID) | ORAL | Status: DC | PRN

## 2012-11-11 NOTE — Patient Instructions (Signed)
Thank you for coming in, today!  For your back - you can keep taking the tramadol as you need. If you start having any weakness, numbness, or if you lose control of your or bladder, go to the emergency room, or call or come back here.  Keep taking your other medicines without any changes. Next time I see you, we may need to adjust medicines for blood pressure.  Make an appointment to come back in January.  Please feel free to call with any questions or concerns at any time, at [phone]. --Dr. Casper Harrison

## 2012-11-11 NOTE — Progress Notes (Signed)
  Subjective:    Patient ID: Brian Burns, male    DOB: May 14, 1945, 67 y.o.   MRN: [id number]  HPI: Pt presents to clinic for SDA for acutely worsened chronic back pain. Pt states pain is in his low back/lumbar area and has been present for years. Most recently saw Dr. Louanne Belton several months ago, and has been taking Tramadol with decent relief. Also occasionally takes Tylenol or Aleve. Pain is worse with sitting/standing for extended periods. Currently 5/10, similar to acute flares of his pain without new/different symptoms. Denies saddle numbness, pain/weakness/numbness/tingling in his feet or legs, change in bowel/bladder habits. Has never been seen by physical therapy. Also endorses pain in his knees. Reports compliance with other meds for BP. States BP "gets high" when he's in pain.  Review of Systems: As above. Does complain of some decreased energy in general but feels his back pain interferes with sleep. No headache, chest pain, SOB     Objective:   Physical Exam BP 143/87  Pulse 64  Ht 6\' 4"  (1.93 m)  Wt 222 lb (100.699 kg)  BMI 27.03 kg/m2  Gen: well-appearing adult male in no acute distress Cardio: RRR, no murmur appreciated Neuro: strength and sensation intact to all four extremities, pt sits/stands/ambulates without assistance MSK: point tenderness in lumbar spine midline and across bony prominences of sacrum/pelvis  Some muscular tenderness and spasm paraspinally, worse right than left, around T10-L2 area Ext: warm, well-perfused, no LE edema, distal pulses intact     Assessment & Plan:

## 2012-11-11 NOTE — Assessment & Plan Note (Signed)
Similar in nature to previous pain, decently controlled with tramadol and occasional Tylenol. Recommended physical therapy but pt declined (not interested "at all"). Refilled tramadol - pt was written Rx by me and left at front desk at the end of August but never picked the Rx up, so refill has lasted from Dr. Louanne Belton (who graduated at the end of June), so do not feel refilling narcotic was an issue. Elevated BP today likely secondary to acute pain, but may need to consider increase meds at f/u. Follow up PRN.

## 2012-11-14 ENCOUNTER — Other Ambulatory Visit: Payer: Self-pay | Admitting: Family Medicine

## 2013-01-13 ENCOUNTER — Ambulatory Visit (INDEPENDENT_AMBULATORY_CARE_PROVIDER_SITE_OTHER): Payer: Medicare Other | Admitting: Family Medicine

## 2013-01-13 ENCOUNTER — Encounter: Payer: Self-pay | Admitting: Family Medicine

## 2013-01-13 ENCOUNTER — Ambulatory Visit: Payer: Medicare Other | Admitting: Family Medicine

## 2013-01-13 VITALS — BP 134/71 | HR 68 | Temp 97.7°F | Resp 16 | Wt 226.0 lb

## 2013-01-13 DIAGNOSIS — I10 Essential (primary) hypertension: Secondary | ICD-10-CM

## 2013-01-13 DIAGNOSIS — M545 Low back pain: Secondary | ICD-10-CM

## 2013-01-13 MED ORDER — TRAMADOL HCL 50 MG PO TABS: 50.0000 mg | ORAL_TABLET | Freq: Three times a day (TID) | ORAL | Status: DC | PRN

## 2013-01-13 NOTE — Patient Instructions (Signed)
Brian Burns, it was nice seeing you today.  I would continue with the tylenol and tramadol as needed for pain.  As well, i would consider formal physical therapy to help with your core strength which could decrease your low back pain.  Please read the below handout, especially the seek immediate care section.  Thanks, Dr. Paulina Fusi    Back Pain, Adult Low back pain is very common. About 1 in 5 people have back pain.The cause of low back pain is rarely dangerous. The pain often gets better over time.About half of people with a sudden onset of back pain feel better in just 2 weeks. About 8 in 10 people feel better by 6 weeks.  CAUSES Some common causes of back pain include:  Strain of the muscles or ligaments supporting the spine.  Wear and tear (degeneration) of the spinal discs.  Arthritis.  Direct injury to the back.   SEEK IMMEDIATE MEDICAL CARE IF:   You have pain that radiates from your back into your legs.  You develop new bowel or bladder control problems.  You have unusual weakness or numbness in your arms or legs.  You develop nausea or vomiting.  You develop abdominal pain.  You feel faint. Document Released: 01/14/2005 Document Revised: 07/16/2011 Document Reviewed: 06/04/2010 [facility] Patient Information 2014 [place], [place].

## 2013-01-13 NOTE — Progress Notes (Signed)
Brian Burns is a 67 y.o. who presents today for low back pain that has been chronic.  Pain is worse with activity, walking, extending his back and walking up hills and paresthesias down both legs in the front and the back.  He denies any bowel/bladder incontinence, fever, chills, sweats, night time awakenings, leg weakness or deficit.  Has tried tramadol which works very well for him and has not tried PT prior.  Last imaging in 2013 showing some spondylosis of the lateral foramina.     Past Medical History  Diagnosis Date  . Hypertension   . HLD (hyperlipidemia)   . Erectile dysfunction   . Chronic back pain   . CAD (coronary artery disease)   . Carotid artery disease   . PVD (peripheral vascular disease) with claudication   . CVA (cerebrovascular accident) 5/09    Rt sided  . Stroke 01/06/2012  . GERD (gastroesophageal reflux disease)   . Arthritis      Physical Exam Filed Vitals:   01/13/13 1025  BP: 134/71  Pulse: 68  Temp: 97.7 F (36.5 C)  Resp: 16    Gen: NAD, Well nourished, Well developed Cardio: RRR, No murmurs/gallops/rubs Lungs: CTA, no wheezes, rhonchi, crackles Neuro: CN 2-12 intact, MS 5/5 B/L UE and LE, +2 patellar and achilles relfex b/l  Back Exam: 1.Gait  1. Walk on heels (L5 root) yes  Walk on toes (S1 root) yes 2. TTP along Lumbar Vertebrae - no 3. Pain with :   1) Extension - yes  2) Flexion - yes 4. One Legged Hyperextension for Spondy - no  5. Straight Leg Raise yes  @ 40 degrees B/L   1) Radiation into opposite leg - no   2) Worse with Dorsiflexion of ankle yes  7. DTR - +2 B/L LE  8. MS - 5/5 LE B/L  9. Vascular Exam : DP and PT +2 B/L

## 2013-01-13 NOTE — Assessment & Plan Note (Signed)
BP well controlled today at 134/71, no changes to meds at this time.

## 2013-01-13 NOTE — Assessment & Plan Note (Addendum)
Pt presentation very typical for a lumbar central spinal stenosis with possible lateral foramina recess stenosis.  Would continue with tylenol and tramadol as needed for pain and urged pt to consider formal physical therapy with core strengthening that could help improve his Sx.  As well, could consider repeat imaging as last X-rays were in 2013 and if no improvement with PT and repeat X-rays look similar, would consider MRI w/o contrast of his lumbar region.  At this point, would consider epidural steroid injections vs referral to neurosurgery or orthopaedic surgery if he does have true central stenosis.  F/U in 6-8 weeks with Dr. Casper Harrison, at which point he will need a referral for PT if he decides this is the path he would like to take.  Refilled tramadol today and no red flags that were outlined in H&P today.

## 2013-01-14 ENCOUNTER — Ambulatory Visit: Payer: Managed Care, Other (non HMO) | Admitting: Neurosurgery

## 2013-02-16 ENCOUNTER — Ambulatory Visit (INDEPENDENT_AMBULATORY_CARE_PROVIDER_SITE_OTHER): Payer: Medicare HMO | Admitting: Family Medicine

## 2013-02-16 ENCOUNTER — Encounter: Payer: Self-pay | Admitting: Family Medicine

## 2013-02-16 VITALS — BP 147/82 | HR 55 | Temp 98.3°F | Ht 76.0 in | Wt 224.0 lb

## 2013-02-16 DIAGNOSIS — N35919 Unspecified urethral stricture, male, unspecified site: Secondary | ICD-10-CM

## 2013-02-16 DIAGNOSIS — N400 Enlarged prostate without lower urinary tract symptoms: Secondary | ICD-10-CM

## 2013-02-16 DIAGNOSIS — Z23 Encounter for immunization: Secondary | ICD-10-CM

## 2013-02-16 DIAGNOSIS — E785 Hyperlipidemia, unspecified: Secondary | ICD-10-CM

## 2013-02-16 DIAGNOSIS — M545 Low back pain, unspecified: Secondary | ICD-10-CM

## 2013-02-16 DIAGNOSIS — R350 Frequency of micturition: Secondary | ICD-10-CM

## 2013-02-16 DIAGNOSIS — Z01810 Encounter for preprocedural cardiovascular examination: Secondary | ICD-10-CM | POA: Insufficient documentation

## 2013-02-16 DIAGNOSIS — I10 Essential (primary) hypertension: Secondary | ICD-10-CM

## 2013-02-16 DIAGNOSIS — Z0000 Encounter for general adult medical examination without abnormal findings: Secondary | ICD-10-CM

## 2013-02-16 HISTORY — DX: Unspecified urethral stricture, male, unspecified site: N35.919

## 2013-02-16 LAB — POCT URINALYSIS DIPSTICK
BILIRUBIN UA: NEGATIVE
GLUCOSE UA: NEGATIVE
Ketones, UA: NEGATIVE
Leukocytes, UA: NEGATIVE
Nitrite, UA: NEGATIVE
Protein, UA: 30
RBC UA: NEGATIVE
Urobilinogen, UA: 2
pH, UA: 6

## 2013-02-16 LAB — POCT UA - MICROSCOPIC ONLY

## 2013-02-16 MED ORDER — FINASTERIDE 5 MG PO TABS: 5.0000 mg | ORAL_TABLET | Freq: Every day | ORAL | Status: DC

## 2013-02-16 NOTE — Assessment & Plan Note (Signed)
Pt recently seen by Dr. Paulina Fusi, with suggestion at that time for formal PT referral and/or repeat imaging (last xrays 2013), then possible referral to neuro- or orthopedic surgery; pt declines further work-up or imaging at least at today's visit, as tramadol is controlling his pain, currently. No red flags to suggest spinal cord compression, as urinary complaints are much more likely due to BPH. Will continue to counsel at next f/u.

## 2013-02-16 NOTE — Assessment & Plan Note (Signed)
Flu shot given, today. Overdue for other vaccines, but declines, today. Overdue for colonoscopy. [facility]d[employer] recommended tests / vaccines at next visit.

## 2013-02-16 NOTE — Progress Notes (Signed)
   Subjective:    Patient ID: Brian Burns, male    DOB: 1945-11-28, 68 y.o.   MRN: [id number]  HPI: Pt presents to clinic for general follow-up. He has acute complaints of chest congestion and nighttime urination increased.  Chest congestion - present for about 2 weeks, with cough for about the same amount of time - denies fevers, chills, productive sputum - breathing is normal, but sometimes has some SOB - no N/V, no change in bowel habits - has not had a flu shot this year, requests one today  Nighttime urination - pt complains of long-time urination at night for years - it has not been worse lately but states it is still bothersome (gets up several times per night) - has had hematuria in the past but has not seen it in a long time (at least several months) - he has been to a "specialist beside [employer] (urology for chart review), but did not go for the surgery (cystoscopy) because he changed his mind and didn't want the procedure done  HTN: pt reports compliance with meds, denies LE swelling - does occasionally have some chest pain at random that lasts only a few minutes - denies headache or SOB at rest; does have some SOB with ambulating long distances  HLD - reports compliance with statin without new side effects or issues - denies new / different muscle aches / pains (see immediately below)  Chronic back pain - about the same, "not worse or better" - decently controlled with tramadol, some muscle spasms "sometimes" - pt has considered PT and additonal imaging since visit with Dr. Paulina Fusi last month, but declines further work-up or referral, today   Pt is a former smoker. In addition to the above documentation, pt's PMH, surgical history, FH, and SH all reviewed and updated where appropriate in the EMR. I have also reviewed and updated the pt's allergies and current medications as appropriate.  Review of Systems: As above. Otherwise, full 12-system ROS was reviewed and all  negative.     Objective:   Physical Exam BP 147/82  Pulse 55  Temp(Src) 98.3 F (36.8 C) (Oral)  Ht 6\' 4"  (1.93 m)  Wt 224 lb (101.606 kg)  BMI 27.28 kg/m2 Gen: well-appearing adult male in NAD HEENT: [place]/AT, sclerae/conjunctivae clear, no lid lag, EOMI, PERRLA   MMM, posterior oropharynx clear, no cervical lymphadenopathy  neck supple with full ROM, no masses appreciated; thyroid not enlarged  Cardio: RRR, no murmur appreciated; distal pulses intact/symmetric Pulm: CTAB, no wheezes, normal WOB  Abd: soft, nondistended, BS+, no HSM Neuro: strength and sensation intact to all four extremities, pt sits/stands/ambulates without assistance   Baseline slurring of speech / poor articulation, but no new focal deficit from previous exams MSK: point tenderness in lumbar spine midline and across bony prominences of sacrum/pelvis   Some muscular tenderness and spasm paraspinally, diffusely  Ext: warm, well-perfused, no LE edema, distal pulses intact Psych: mood euthymic with congruent affect Rectal exam: pt declines     Assessment & Plan:  See problem list notes.

## 2013-02-16 NOTE — Assessment & Plan Note (Signed)
Likely related to BPH; see separate problem list note.

## 2013-02-16 NOTE — Assessment & Plan Note (Signed)
Reports compliance with medications. BP slightly elevated today with acute pain. Continue current medications, though could consider addition of ACE / ARB to Norvasc. Monitor at next visit and consider BMP.

## 2013-02-16 NOTE — Patient Instructions (Signed)
Thank you for coming in, today!  I think your urination at night could be from your prostate. We will check a lab that looks for prostate problems, but I also want you to see the urologist again. We will send in a referral today and you'll be set up for an appointment soon. In the meantime, we can try a different medication for your symptoms. It is called Proscar and should be taken once per day.  Otherwise, keep taking your regular medications. Come back to see me in about 3 months.  Please feel free to call with any questions or concerns at any time, at [phone]. --Dr. Casper Harrison

## 2013-02-16 NOTE — Assessment & Plan Note (Signed)
Previously diagnosed by urology, though pt did not follow up with them as instructed; unclear from notes if pt had full cystoscopy or not and pt is not a reliable historian. Possible contribution to urinary frequency symptoms. See also BPH problem list note.

## 2013-02-16 NOTE — Assessment & Plan Note (Signed)
Reports compliance with Lipitor and no new side effects. Continue for now at current dose, 40 mg daily. Consider CMP and/or lipid panel at next visit.

## 2013-02-16 NOTE — Assessment & Plan Note (Signed)
A: Likely at least partial cause of lower urinary tract symptoms and urinary frequency; pt has not lost continence. Pt has been on Flomax in the past but did not like it, and has been worked up by urology in the past for similar symptoms and hematuria (hematuria no longer present); unclear if pt had full cystoscopy or not, from urology notes and pt is not a reliable historian. Pt declines rectal exam today.  P: PSA drawn today and re-referred to urology after discussion with Dr. Lum Babe. Rx for Proscar to help with symptoms. Plan to f/u after pt is seen by urology again. Will f/u PSA check as appropriate; if elevated, will communicate to pt and urology.

## 2013-02-17 LAB — PSA: PSA: 1.05 ng/mL (ref <=4.00)

## 2013-02-23 ENCOUNTER — Emergency Department (HOSPITAL_COMMUNITY): Payer: Medicare HMO

## 2013-02-23 ENCOUNTER — Encounter (HOSPITAL_COMMUNITY): Payer: Self-pay | Admitting: Emergency Medicine

## 2013-02-23 ENCOUNTER — Emergency Department (INDEPENDENT_AMBULATORY_CARE_PROVIDER_SITE_OTHER)
Admission: EM | Admit: 2013-02-23 | Discharge: 2013-02-23 | Disposition: A | Discharge status: Nursing Home (Includes ICF) | Payer: Medicare HMO | Source: Home / Self Care | Attending: Family Medicine | Admitting: Family Medicine

## 2013-02-23 ENCOUNTER — Emergency Department (HOSPITAL_COMMUNITY)
Admission: EM | Admit: 2013-02-23 | Discharge: 2013-02-23 | Disposition: A | Discharge status: Home / Self Care | Payer: Medicare HMO | Attending: Emergency Medicine | Admitting: Emergency Medicine

## 2013-02-23 DIAGNOSIS — Z79899 Other long term (current) drug therapy: Secondary | ICD-10-CM | POA: Insufficient documentation

## 2013-02-23 DIAGNOSIS — R42 Dizziness and giddiness: Secondary | ICD-10-CM | POA: Insufficient documentation

## 2013-02-23 DIAGNOSIS — I2510 Atherosclerotic heart disease of native coronary artery without angina pectoris: Secondary | ICD-10-CM | POA: Insufficient documentation

## 2013-02-23 DIAGNOSIS — G459 Transient cerebral ischemic attack, unspecified: Secondary | ICD-10-CM

## 2013-02-23 DIAGNOSIS — Z87891 Personal history of nicotine dependence: Secondary | ICD-10-CM | POA: Insufficient documentation

## 2013-02-23 DIAGNOSIS — Z8719 Personal history of other diseases of the digestive system: Secondary | ICD-10-CM | POA: Insufficient documentation

## 2013-02-23 DIAGNOSIS — M129 Arthropathy, unspecified: Secondary | ICD-10-CM | POA: Insufficient documentation

## 2013-02-23 DIAGNOSIS — I498 Other specified cardiac arrhythmias: Secondary | ICD-10-CM

## 2013-02-23 DIAGNOSIS — R001 Bradycardia, unspecified: Secondary | ICD-10-CM

## 2013-02-23 DIAGNOSIS — Z8673 Personal history of transient ischemic attack (TIA), and cerebral infarction without residual deficits: Secondary | ICD-10-CM | POA: Insufficient documentation

## 2013-02-23 DIAGNOSIS — R51 Headache: Secondary | ICD-10-CM | POA: Insufficient documentation

## 2013-02-23 DIAGNOSIS — E785 Hyperlipidemia, unspecified: Secondary | ICD-10-CM | POA: Insufficient documentation

## 2013-02-23 DIAGNOSIS — Z87448 Personal history of other diseases of urinary system: Secondary | ICD-10-CM | POA: Insufficient documentation

## 2013-02-23 DIAGNOSIS — I10 Essential (primary) hypertension: Secondary | ICD-10-CM | POA: Insufficient documentation

## 2013-02-23 DIAGNOSIS — Z7982 Long term (current) use of aspirin: Secondary | ICD-10-CM | POA: Insufficient documentation

## 2013-02-23 DIAGNOSIS — G8929 Other chronic pain: Secondary | ICD-10-CM | POA: Insufficient documentation

## 2013-02-23 LAB — URINE MICROSCOPIC-ADD ON

## 2013-02-23 LAB — DIFFERENTIAL
BASOS ABS: 0 10*3/uL (ref 0.0–0.1)
BASOS PCT: 0 % (ref 0–1)
EOS ABS: 0.1 10*3/uL (ref 0.0–0.7)
Eosinophils Relative: 2 % (ref 0–5)
Lymphocytes Relative: 38 % (ref 12–46)
Lymphs Abs: 1.8 10*3/uL (ref 0.7–4.0)
MONO ABS: 0.3 10*3/uL (ref 0.1–1.0)
MONOS PCT: 6 % (ref 3–12)
Neutro Abs: 2.6 10*3/uL (ref 1.7–7.7)
Neutrophils Relative %: 54 % (ref 43–77)

## 2013-02-23 LAB — URINALYSIS, ROUTINE W REFLEX MICROSCOPIC
BILIRUBIN URINE: NEGATIVE
Glucose, UA: NEGATIVE mg/dL
Hgb urine dipstick: NEGATIVE
Ketones, ur: NEGATIVE mg/dL
Leukocytes, UA: NEGATIVE
NITRITE: NEGATIVE
PROTEIN: 30 mg/dL — AB
Specific Gravity, Urine: 1.024 (ref 1.005–1.030)
UROBILINOGEN UA: 1 mg/dL (ref 0.0–1.0)
pH: 6 (ref 5.0–8.0)

## 2013-02-23 LAB — BASIC METABOLIC PANEL
BUN: 15 mg/dL (ref 6–23)
CALCIUM: 8.7 mg/dL (ref 8.4–10.5)
CO2: 26 mEq/L (ref 19–32)
Chloride: 104 mEq/L (ref 96–112)
Creatinine, Ser: 1.23 mg/dL (ref 0.50–1.35)
GFR calc Af Amer: 69 mL/min — ABNORMAL LOW (ref >90)
GFR calc non Af Amer: 59 mL/min — ABNORMAL LOW (ref >90)
Glucose, Bld: 115 mg/dL — ABNORMAL HIGH (ref 70–99)
Potassium: 4 mEq/L (ref 3.7–5.3)
SODIUM: 141 meq/L (ref 137–147)

## 2013-02-23 LAB — APTT: aPTT: 30 seconds (ref 24–37)

## 2013-02-23 LAB — PROTIME-INR
INR: 1.06 (ref 0.00–1.49)
Prothrombin Time: 13.6 seconds (ref 11.6–15.2)

## 2013-02-23 LAB — POCT I-STAT TROPONIN I: Troponin i, poc: 0 ng/mL (ref 0.00–0.08)

## 2013-02-23 LAB — CBC
HCT: 35.5 % — ABNORMAL LOW (ref 39.0–52.0)
Hemoglobin: 12 g/dL — ABNORMAL LOW (ref 13.0–17.0)
MCH: 28.6 pg (ref 26.0–34.0)
MCHC: 33.8 g/dL (ref 30.0–36.0)
MCV: 84.7 fL (ref 78.0–100.0)
PLATELETS: 143 10*3/uL — AB (ref 150–400)
RBC: 4.19 MIL/uL — ABNORMAL LOW (ref 4.22–5.81)
RDW: 14.8 % (ref 11.5–15.5)
WBC: 4.8 10*3/uL (ref 4.0–10.5)

## 2013-02-23 LAB — RAPID URINE DRUG SCREEN, HOSP PERFORMED
AMPHETAMINES: NOT DETECTED
Barbiturates: NOT DETECTED
Benzodiazepines: NOT DETECTED
COCAINE: NOT DETECTED
Opiates: NOT DETECTED
TETRAHYDROCANNABINOL: NOT DETECTED

## 2013-02-23 LAB — TROPONIN I: Troponin I: <0.3 ng/mL (ref <0.30)

## 2013-02-23 NOTE — ED Notes (Signed)
Pt sent here from [facility]. Reports waking up this am with dizziness, diaphoresis and headache. Reports symptoms have resolved pta. HR 51 at triage, ekg being done. A&ox4.

## 2013-02-23 NOTE — ED Notes (Signed)
C/o waking with headache, dizziness, and hot flashes.  States "room spinning".   Denies n/v chest pain and sob.

## 2013-02-23 NOTE — ED Provider Notes (Signed)
Brian Burns is a 68 y.o. male who presents to Urgent Care today with complaint of sweatiness, dizziness, sensation of room spinning, hot flashes, and headache present when he woke up this morning (symptoms were not present prior to going to bed last night). Pt has a history of stroke; he does not think his speech is more slurred than usual or that his face has any drooping, but his wife thinks the left side of his face is slightly drooped compared to normal. He denies chest pain, SOB, N/V. He was able to walk into the urgent care without any issues. He still feels like the room is spinning and has headache on the right (otherwise is vaguely described). He does not think he feels weak anywhere. He denies numbness or paralysis anywhere.   Past Medical History  Diagnosis Date  . Hypertension   . HLD (hyperlipidemia)   . Erectile dysfunction   . Chronic back pain   . CAD (coronary artery disease)   . Carotid artery disease   . PVD (peripheral vascular disease) with claudication   . CVA (cerebrovascular accident) 5/09    Rt sided  . Stroke 01/06/2012  . GERD (gastroesophageal reflux disease)   . Arthritis    History  Substance Use Topics  . Smoking status: Former Smoker -- 0.50 packs/day for 40 years    Types: Cigarettes    Quit date: 01/29/2004  . Smokeless tobacco: Never Used  . Alcohol Use: No   ROS as above Medications: No current facility-administered medications for this encounter.   Current Outpatient Prescriptions  Medication Sig Dispense Refill  . amLODipine (NORVASC) 10 MG tablet Take 1 tablet (10 mg total) by mouth daily.  90 tablet  1  . atorvastatin (LIPITOR) 40 MG tablet Take 40 mg by mouth every morning.      Marland Kitchen. aspirin 81 MG tablet Take 81 mg by mouth daily.      . cetirizine (ZYRTEC) 10 MG tablet Take 1 tablet (10 mg total) by mouth daily.  30 tablet  11  . finasteride (PROSCAR) 5 MG tablet Take 1 tablet (5 mg total) by mouth daily.  30 tablet  1  . gabapentin  (NEURONTIN) 100 MG capsule Take 1 capsule (100 mg total) by mouth 3 (three) times daily.  180 capsule  1  . nitroGLYCERIN (NITRODUR - DOSED IN MG/24 HR) 0.2 mg/hr Place 1/4 patch on right elbow at site of pain daily  30 patch  0  . traMADol (ULTRAM) 50 MG tablet Take 1-2 tablets (50-100 mg total) by mouth every 8 (eight) hours as needed.  90 tablet  2    Exam:  BP 162/97  Pulse 48  Temp(Src) 97.5 F (36.4 C) (Oral)  Resp 16  SpO2 97% Gen: Well-appearing adult male in NAD; dysarthric at baseline HEENT: [place]/AT, PERRLA EOMI,  MMM  Subtle left-sided mouth droop; no abnormality on tongue movements Neuro: CN grossly intact other than subtle left mouth drop and shrug weakness compared to right  Grip strength and upper extremity flexion/extension 5/5 bilaterally  Lower extremity strength 5/5 on right, 4+/5 plantar flexion on left  Subtle left pronator drift and decreased coordination with finger-to-nose on left Lungs: Normal work of breathing. CTAB Heart: bradycardic but no MRG Abd: NABS, Soft. NT, ND Exts: Brisk capillary refill, warm and well perfused.   No results found for this or any previous visit (from the past 24 hour(s)). No results found.  EKG Date: 02/23/2013   Rate: 50  Rhythm: sinus   QRS Axis: normal   Intervals: PR 146, QRS 88, QTc 435   ST/T Wave abnormalities: flat T-waves inferiorly; early repol in V2 and V3, poor R-wave progression precordially (not new)   Conduction Disturbances: none obvious   Narrative Interpretation: bradycardic with less prominent QRS in III and aVF but otherwise essentially unchanged from previous; no STEMI   Old EKG Reviewed: May 2014  Assessment and Plan: 68 y.o. male with acute onset of dizziness, headache, and sweating, with left-sided physical exam findings concerning for TIA or new stroke, though possible residual from hx of prior strokes. Pt also bradycardic, though has had this before. EKG without changes concerning for STEMI. Pt seen  and examined by me as his PCP a few days ago; I did not appreciate left-sided weakness at that time. Outside of code stroke window as symptoms were present when pt woke up 2 hours ago but were not present prior to going to bed last night Discussed with pt and wife that I advise further work-up in the ED to likely include at least CT of head and labs. Pt is agreeable to this. Will transfer to ED via shuttle for further eval. Questions answered to pt's satisfaction.  The above was discussed in its entirety with attending Urgent Care physician Dr. Denyse Amass.   Bobbye Morton, MD  PGY-2, [facility] 02/23/2013, 10:35 AM   Bobbye Morton, MD 02/23/13 [phone]

## 2013-02-23 NOTE — ED Notes (Signed)
Dr. Lynelle Doctor back at the bedside to speak with the patient.

## 2013-02-23 NOTE — ED Provider Notes (Signed)
CSN: [id number]     Arrival date & time 02/23/13  1051 History   First MD Initiated Contact with Patient 02/23/13 1150     Chief Complaint  Patient presents with  . Dizziness    Patient is a 68 y.o. male presenting with dizziness.  Dizziness Quality:  Head spinning and room spinning Severity:  Severe Onset quality:  Sudden (pt noticed it when he woke up this morning at 0830, no symptoms last night) Duration: maybe 5-10 minutes. Timing:  Constant Progression:  Resolved Chronicity:  New Worsened by:  Nothing tried Associated symptoms: headaches   Associated symptoms: no chest pain, no shortness of breath, no vision changes and no weakness   Headache was mild in forehead,  Resolved now.  No trouble with speech or coordination.  Past Medical History  Diagnosis Date  . Hypertension   . HLD (hyperlipidemia)   . Erectile dysfunction   . Chronic back pain   . CAD (coronary artery disease)   . Carotid artery disease   . PVD (peripheral vascular disease) with claudication   . CVA (cerebrovascular accident) 5/09    Rt sided  . Stroke 01/06/2012  . GERD (gastroesophageal reflux disease)   . Arthritis    Past Surgical History  Procedure Laterality Date  . Eye surgery Left April 2014    Cataract  . Eye surgery Right Jun 16, 2012    Cataract by Dr. Elmer PickerHecker   Family History  Problem Relation Age of Onset  . Lung disease Brother   . Kidney disease Brother   . Heart disease Brother     died at 5358  . Heart disease Mother   . Kidney disease Mother   . Lung disease Mother    History  Substance Use Topics  . Smoking status: Former Smoker -- 0.50 packs/day for 40 years    Types: Cigarettes    Quit date: 01/29/2004  . Smokeless tobacco: Never Used  . Alcohol Use: No    Review of Systems  Respiratory: Negative for shortness of breath.   Cardiovascular: Negative for chest pain.  Neurological: Positive for dizziness and headaches.  All other systems reviewed and are  negative.    Allergies  Novocain  Home Medications   Current Outpatient Rx  Name  Route  Sig  Dispense  Refill  . amLODipine (NORVASC) 10 MG tablet   Oral   Take 1 tablet (10 mg total) by mouth daily.   90 tablet   1   . aspirin 81 MG tablet   Oral   Take 81 mg by mouth daily.         Marland Kitchen. atorvastatin (LIPITOR) 40 MG tablet   Oral   Take 40 mg by mouth every morning.         . finasteride (PROSCAR) 5 MG tablet   Oral   Take 1 tablet (5 mg total) by mouth daily.   30 tablet   1   . gabapentin (NEURONTIN) 100 MG capsule   Oral   Take 100 mg by mouth 3 (three) times daily as needed. Nerve pain.         . traMADol (ULTRAM) 50 MG tablet   Oral   Take 1-2 tablets (50-100 mg total) by mouth every 8 (eight) hours as needed.   90 tablet   2    BP 140/91  Pulse 56  Temp(Src) 97.7 F (36.5 C)  Resp 14  SpO2 100% Physical Exam  Nursing note and vitals reviewed. Constitutional:  He is oriented to person, place, and time. He appears well-developed and well-nourished. No distress.  HENT:  Head: Normocephalic and atraumatic.  Right Ear: External ear normal.  Left Ear: External ear normal.  Mouth/Throat: Oropharynx is clear and moist.  Eyes: Conjunctivae are normal. Right eye exhibits no discharge. Left eye exhibits no discharge. No scleral icterus.  Neck: Neck supple. No tracheal deviation present.  Cardiovascular: Normal rate, regular rhythm and intact distal pulses.   Pulmonary/Chest: Effort normal and breath sounds normal. No stridor. No respiratory distress. He has no wheezes. He has no rales.  Abdominal: Soft. Bowel sounds are normal. He exhibits no distension. There is no tenderness. There is no rebound and no guarding.  Musculoskeletal: He exhibits no edema and no tenderness.  Neurological: He is alert and oriented to person, place, and time. He has normal strength. No cranial nerve deficit (No facial droop, extraocular movements intact, tongue midline ) or  sensory deficit. He exhibits normal muscle tone. He displays no seizure activity. Coordination normal.  No pronator drift bilateral upper extrem, able to hold both legs off bed for 5 seconds, sensation intact in all extremities, no visual field cuts, no left or right sided neglect, normal finger-nose exam bilaterally, no nystagmus noted   Skin: Skin is warm and dry. No rash noted.  Psychiatric: He has a normal mood and affect.    ED Course  Procedures (including critical care time) Labs Review Labs Reviewed  CBC - Abnormal; Notable for the following:    RBC 4.19 (*)    Hemoglobin 12.0 (*)    HCT 35.5 (*)    Platelets 143 (*)    All other components within normal limits  BASIC METABOLIC PANEL - Abnormal; Notable for the following:    Glucose, Bld 115 (*)    GFR calc non Af Amer 59 (*)    GFR calc Af Amer 69 (*)    All other components within normal limits  URINALYSIS, ROUTINE W REFLEX MICROSCOPIC - Abnormal; Notable for the following:    Protein, ur 30 (*)    All other components within normal limits  URINE MICROSCOPIC-ADD ON - Abnormal; Notable for the following:    Squamous Epithelial / LPF FEW (*)    Casts HYALINE CASTS (*)    All other components within normal limits  PROTIME-INR  APTT  TROPONIN I  URINE RAPID DRUG SCREEN (HOSP PERFORMED)  DIFFERENTIAL  POCT I-STAT TROPONIN I   Imaging Review Ct Head Wo Contrast  02/23/2013   CLINICAL DATA:  Dizziness and headache earlier today  EXAM: CT HEAD WITHOUT CONTRAST  TECHNIQUE: Contiguous axial images were obtained from the base of the skull through the vertex without intravenous contrast.  COMPARISON:  MR HEAD W/O CM dated 01/08/2012  FINDINGS: There is mild diffuse cerebral atrophy with compensatory ventriculomegaly. There is an old lacunar infarction in the basal ganglia on the right. There is no evidence of an acute ischemic or hemorrhagic infarction. There is no intracranial mass effect. The right cerebellar hemisphere exhibits  stable hypodensity consistent with previous ischemic insult. The pons exhibits no acute or definite chronic abnormality in the region of previous ischemic insult.  At bone window settings the observed portions of the paranasal sinuses and mastoid air cells are clear. There is no evidence of an acute skull fracture.  IMPRESSION: 1. There is no evidence of an acute intracranial hemorrhage nor of an acute evolving ischemic infarction. 2. There are findings consistent with previous ischemic insults in the  right basal ganglia and right cerebellar hemisphere. The previous pontine ischemic change is not evident today. 3. There is mild ventriculomegaly which is stable. There is no evidence of an intracranial mass.   Electronically Signed   By: David  [place]   On: 02/23/2013 14:27    EKG Interpretation    Date/Time:  Tuesday February 23 2013 11:20:22 EST Ventricular Rate:  50 PR Interval:  140 QRS Duration: 82 QT Interval:  440 QTC Calculation: 401 R Axis:   53 Text Interpretation:  Sinus bradycardia Nonspecific T wave abnormality Abnormal ECG No significant change since last tracing Confirmed by Laloni Rowton  MD-J, Denese Mentink ([id number]) on 02/23/2013 12:29:43 PM            MDM   1. Dizziness    Pt feel fine in the ED without any symptoms.  Family is at the bedside as well and he appears normal to them.  Questionable TIA although the dizziness is not definitive for TIA and his other symptoms of facial drooping was subjective  And questionable per family.  Pt has had a stroke in the past and has had extensive evaluation including annual screening.   At this time there does not appear to be any evidence of an acute emergency medical condition and the patient appears stable for discharge with appropriate outpatient follow up.     Celene Kras, MD 02/23/13 1520

## 2013-02-23 NOTE — Discharge Instructions (Signed)

## 2013-02-24 NOTE — ED Provider Notes (Signed)
Medical screening examination/treatment/procedure(s) were performed by a resident physician or non-physician practitioner and as the supervising physician I was immediately available for consultation/collaboration.  Clementeen Graham, MD    Rodolph Bong, MD 02/24/13 [phone]

## 2013-03-02 ENCOUNTER — Other Ambulatory Visit: Payer: Self-pay | Admitting: Family Medicine

## 2013-03-09 ENCOUNTER — Telehealth: Payer: Self-pay | Admitting: *Deleted

## 2013-03-09 NOTE — Telephone Encounter (Signed)
Patient not home. Left message with a male to please have pt call office.  Was calling to remind patient of hospital f/u with PCP 03/18/2013 at 1:30pm.  Radene Ou, CMA

## 2013-03-16 ENCOUNTER — Other Ambulatory Visit: Payer: Self-pay | Admitting: Vascular Surgery

## 2013-03-16 DIAGNOSIS — I714 Abdominal aortic aneurysm, without rupture, unspecified: Secondary | ICD-10-CM

## 2013-03-16 DIAGNOSIS — I739 Peripheral vascular disease, unspecified: Secondary | ICD-10-CM

## 2013-03-17 ENCOUNTER — Emergency Department (HOSPITAL_COMMUNITY): Payer: Medicare HMO

## 2013-03-17 ENCOUNTER — Emergency Department (HOSPITAL_COMMUNITY)
Admission: EM | Admit: 2013-03-17 | Discharge: 2013-03-17 | Disposition: A | Discharge status: Home / Self Care | Payer: Medicare HMO | Attending: Emergency Medicine | Admitting: Emergency Medicine

## 2013-03-17 ENCOUNTER — Encounter (HOSPITAL_COMMUNITY): Payer: Self-pay | Admitting: Emergency Medicine

## 2013-03-17 DIAGNOSIS — Z79899 Other long term (current) drug therapy: Secondary | ICD-10-CM | POA: Insufficient documentation

## 2013-03-17 DIAGNOSIS — R110 Nausea: Secondary | ICD-10-CM

## 2013-03-17 DIAGNOSIS — E785 Hyperlipidemia, unspecified: Secondary | ICD-10-CM | POA: Insufficient documentation

## 2013-03-17 DIAGNOSIS — I2510 Atherosclerotic heart disease of native coronary artery without angina pectoris: Secondary | ICD-10-CM | POA: Insufficient documentation

## 2013-03-17 DIAGNOSIS — R112 Nausea with vomiting, unspecified: Secondary | ICD-10-CM | POA: Insufficient documentation

## 2013-03-17 DIAGNOSIS — Z7982 Long term (current) use of aspirin: Secondary | ICD-10-CM | POA: Insufficient documentation

## 2013-03-17 DIAGNOSIS — R42 Dizziness and giddiness: Secondary | ICD-10-CM | POA: Insufficient documentation

## 2013-03-17 DIAGNOSIS — R61 Generalized hyperhidrosis: Secondary | ICD-10-CM | POA: Insufficient documentation

## 2013-03-17 DIAGNOSIS — M129 Arthropathy, unspecified: Secondary | ICD-10-CM | POA: Insufficient documentation

## 2013-03-17 DIAGNOSIS — Z87448 Personal history of other diseases of urinary system: Secondary | ICD-10-CM | POA: Insufficient documentation

## 2013-03-17 DIAGNOSIS — Z87891 Personal history of nicotine dependence: Secondary | ICD-10-CM | POA: Insufficient documentation

## 2013-03-17 DIAGNOSIS — Z8719 Personal history of other diseases of the digestive system: Secondary | ICD-10-CM | POA: Insufficient documentation

## 2013-03-17 DIAGNOSIS — Z8673 Personal history of transient ischemic attack (TIA), and cerebral infarction without residual deficits: Secondary | ICD-10-CM | POA: Insufficient documentation

## 2013-03-17 DIAGNOSIS — G8929 Other chronic pain: Secondary | ICD-10-CM | POA: Insufficient documentation

## 2013-03-17 DIAGNOSIS — I10 Essential (primary) hypertension: Secondary | ICD-10-CM | POA: Insufficient documentation

## 2013-03-17 DIAGNOSIS — R079 Chest pain, unspecified: Secondary | ICD-10-CM | POA: Insufficient documentation

## 2013-03-17 LAB — COMPREHENSIVE METABOLIC PANEL
ALT: 18 U/L (ref 0–53)
AST: 21 U/L (ref 0–37)
Albumin: 3.7 g/dL (ref 3.5–5.2)
Alkaline Phosphatase: 110 U/L (ref 39–117)
BUN: 18 mg/dL (ref 6–23)
CALCIUM: 8.9 mg/dL (ref 8.4–10.5)
CO2: 26 mEq/L (ref 19–32)
Chloride: 104 mEq/L (ref 96–112)
Creatinine, Ser: 1.14 mg/dL (ref 0.50–1.35)
GFR calc non Af Amer: 65 mL/min — ABNORMAL LOW (ref >90)
GFR, EST AFRICAN AMERICAN: 76 mL/min — AB (ref >90)
GLUCOSE: 152 mg/dL — AB (ref 70–99)
Potassium: 3.6 mEq/L — ABNORMAL LOW (ref 3.7–5.3)
SODIUM: 142 meq/L (ref 137–147)
Total Bilirubin: 0.3 mg/dL (ref 0.3–1.2)
Total Protein: 7.5 g/dL (ref 6.0–8.3)

## 2013-03-17 LAB — CBC
HCT: 35.1 % — ABNORMAL LOW (ref 39.0–52.0)
HEMOGLOBIN: 11.6 g/dL — AB (ref 13.0–17.0)
MCH: 28 pg (ref 26.0–34.0)
MCHC: 33 g/dL (ref 30.0–36.0)
MCV: 84.8 fL (ref 78.0–100.0)
PLATELETS: 156 10*3/uL (ref 150–400)
RBC: 4.14 MIL/uL — ABNORMAL LOW (ref 4.22–5.81)
RDW: 14.6 % (ref 11.5–15.5)
WBC: 6.7 10*3/uL (ref 4.0–10.5)

## 2013-03-17 LAB — TROPONIN I: Troponin I: <0.3 ng/mL (ref <0.30)

## 2013-03-17 MED ORDER — ONDANSETRON HCL 4 MG/2ML IJ SOLN
4.0000 mg | Freq: Once | INTRAMUSCULAR | Status: AC
  Administered 2013-03-17: 4 mg via INTRAVENOUS
  Filled 2013-03-17: qty 2

## 2013-03-17 MED ORDER — ONDANSETRON 4 MG PO TBDP: 4.0000 mg | ORAL_TABLET | Freq: Three times a day (TID) | ORAL | Status: DC | PRN

## 2013-03-17 MED ORDER — LORAZEPAM 2 MG/ML IJ SOLN
1.0000 mg | Freq: Once | INTRAMUSCULAR | Status: AC
  Administered 2013-03-17: 1 mg via INTRAVENOUS
  Filled 2013-03-17: qty 1

## 2013-03-17 MED ORDER — SODIUM CHLORIDE 0.9 % IV BOLUS (SEPSIS)
1000.0000 mL | Freq: Once | INTRAVENOUS | Status: AC
  Administered 2013-03-17: 1000 mL via INTRAVENOUS

## 2013-03-17 MED ORDER — MECLIZINE HCL 25 MG PO TABS: 25.0000 mg | ORAL_TABLET | Freq: Two times a day (BID) | ORAL | Status: DC | PRN

## 2013-03-17 MED ORDER — DIAZEPAM 5 MG PO TABS
5.0000 mg | ORAL_TABLET | Freq: Once | ORAL | Status: AC
  Administered 2013-03-17: 5 mg via ORAL
  Filled 2013-03-17: qty 1

## 2013-03-17 MED ORDER — MECLIZINE HCL 25 MG PO TABS
25.0000 mg | ORAL_TABLET | Freq: Once | ORAL | Status: AC
  Administered 2013-03-17: 25 mg via ORAL
  Filled 2013-03-17: qty 1

## 2013-03-17 NOTE — ED Provider Notes (Signed)
Medical screening examination/treatment/procedure(s) were conducted as a shared visit with non-physician practitioner(s) and myself.  I personally evaluated the patient during the encounter.  EKG Interpretation    Date/Time:  Wednesday March 17 2013 10:19:43 EST Ventricular Rate:  49 PR Interval:  158 QRS Duration: 103 QT Interval:  477 QTC Calculation: 431 R Axis:   28 Text Interpretation:  Sinus bradycardia Ventricular premature complex nonspecific T wave changes Confirmed by Jnai Snellgrove  MD, Awanda Wilcock ([id number]) on 03/17/2013 11:01:57 AM            Patient nausea vomiting and dizziness.  I suspect this is positional vertigo.  Doubt ACS.  EKG and troponin negative x2.  Given patient's age she will undergo MRI scan to evaluate for posterior circulation stroke as the cause of his symptoms.  Symptoms are improving in the ER.  MRI pending.  Lyanne Co, MD 03/17/13 [phone]

## 2013-03-17 NOTE — ED Notes (Signed)
Per EMS, sudden onset N/V, diaphoresis, dizziness. Worse with movement.

## 2013-03-17 NOTE — ED Notes (Signed)
MRI called and said patient reported claustrophobia and needs medicine. Ativan brought over to MRI and given to patient in MRI

## 2013-03-17 NOTE — ED Notes (Signed)
Pt. Reports dizziness with N/V starting today. Pt. States hx of same x1 month ago. Pt. Reports dizziness stops when laying down. Pt. Alert and oriented x4. Hx of stroke with no new symptoms.

## 2013-03-17 NOTE — ED Notes (Signed)
Pt. Began vomiting after taking medicine. Also had loose stool.

## 2013-03-17 NOTE — ED Notes (Signed)
PA at bedside.

## 2013-03-17 NOTE — ED Notes (Signed)
Contacted MRI about wait time; it will be appx an hour before pt will be transported. PA made aware.

## 2013-03-17 NOTE — Discharge Instructions (Signed)
Keep your appointment with Dr. Maryjean Ka tomorrow. Return if Symptoms worsen.   Take medication as prescribed.

## 2013-03-17 NOTE — ED Provider Notes (Signed)
CSN: [id number]     Arrival date & time 03/17/13  1012 History   First MD Initiated Contact with Patient 03/17/13 1014     Chief Complaint  Patient presents with  . Emesis     (Consider location/radiation/quality/duration/timing/severity/associated sxs/prior Treatment) HPI Comments: Patient is a 68 year old male the past medical history of hypertension, CAD, PVD, CVA and GERD who presents to the emergency department complaining of sudden onset nausea, vomiting, diaphoresis and dizziness beginning around 9:00 this morning. Symptoms worse when he goes from a laying to seated position and has a sensation that everything around him spinning. Patient states around 3 clock this morning he developed right-sided chest pain described as heartburn, he burped and the symptom quickly subsided after a few seconds. Currently he is denying chest pain, shortness of breath, abdominal pain, diarrhea, fever or chills, weakness, numbness or speech changes.  The history is provided by the patient and the spouse.    Past Medical History  Diagnosis Date  . Hypertension   . HLD (hyperlipidemia)   . Erectile dysfunction   . Chronic back pain   . CAD (coronary artery disease)   . Carotid artery disease   . PVD (peripheral vascular disease) with claudication   . CVA (cerebrovascular accident) 5/09    Rt sided  . Stroke 01/06/2012  . GERD (gastroesophageal reflux disease)   . Arthritis    Past Surgical History  Procedure Laterality Date  . Eye surgery Left April 2014    Cataract  . Eye surgery Right Jun 16, 2012    Cataract by Dr. Elmer PickerHecker   Family History  Problem Relation Age of Onset  . Lung disease Brother   . Kidney disease Brother   . Heart disease Brother     died at 4458  . Heart disease Mother   . Kidney disease Mother   . Lung disease Mother    History  Substance Use Topics  . Smoking status: Former Smoker -- 0.50 packs/day for 40 years    Types: Cigarettes    Quit date: 01/29/2004  .  Smokeless tobacco: Never Used  . Alcohol Use: No    Review of Systems  Constitutional: Positive for diaphoresis.  Cardiovascular: Positive for chest pain (burning, subsided).  Gastrointestinal: Positive for nausea and vomiting.  Neurological: Positive for dizziness.  All other systems reviewed and are negative.      Allergies  Novocain  Home Medications   Current Outpatient Rx  Name  Route  Sig  Dispense  Refill  . amLODipine (NORVASC) 10 MG tablet   Oral   Take 1 tablet (10 mg total) by mouth daily.   90 tablet   1   . aspirin 81 MG tablet   Oral   Take 81 mg by mouth daily.         Marland Kitchen. atorvastatin (LIPITOR) 40 MG tablet   Oral   Take 1 tablet (40 mg total) by mouth daily.   90 tablet   1   . finasteride (PROSCAR) 5 MG tablet   Oral   Take 1 tablet (5 mg total) by mouth daily.   30 tablet   1   . gabapentin (NEURONTIN) 100 MG capsule   Oral   Take 100 mg by mouth 3 (three) times daily as needed. Nerve pain.         . traMADol (ULTRAM) 50 MG tablet   Oral   Take 50-100 mg by mouth every 8 (eight) hours as needed for moderate pain.  BP 149/82  Pulse 62  Temp(Src) 97.9 F (36.6 C) (Oral)  Resp 16  SpO2 95% Physical Exam  Nursing note and vitals reviewed. Constitutional: He is oriented to person, place, and time. He appears well-developed and well-nourished. No distress.  HENT:  Head: Normocephalic and atraumatic.  Mouth/Throat: Oropharynx is clear and moist.  Eyes: Conjunctivae and EOM are normal. Pupils are equal, round, and reactive to light.  Neck: Normal range of motion. Neck supple. No JVD present.  Cardiovascular: Normal rate, regular rhythm, normal heart sounds and intact distal pulses.   No extremity edema.  Pulmonary/Chest: Effort normal and breath sounds normal.  Abdominal: Soft. Bowel sounds are normal. There is no tenderness.  Musculoskeletal: Normal range of motion. He exhibits no edema.  Neurological: He is alert and  oriented to person, place, and time.  Equal grip strength BL. Moved limbs without ataxia. Speech goal-oriented. Symptom of dizziness exacerbated by sitting up.  Skin: Skin is warm. He is diaphoretic.  Psychiatric: He has a normal mood and affect. His behavior is normal.    ED Course  Procedures (including critical care time) Labs Review Labs Reviewed  CBC - Abnormal; Notable for the following:    RBC 4.14 (*)    Hemoglobin 11.6 (*)    HCT 35.1 (*)    All other components within normal limits  COMPREHENSIVE METABOLIC PANEL - Abnormal; Notable for the following:    Potassium 3.6 (*)    Glucose, Bld 152 (*)    GFR calc non Af Amer 65 (*)    GFR calc Af Amer 76 (*)    All other components within normal limits  TROPONIN I  TROPONIN I   Imaging Review Dg Chest 2 View  03/17/2013   CLINICAL DATA:  Weakness  EXAM: CHEST  2 VIEW  COMPARISON:  01/07/2012  FINDINGS: The cardiac shadow is stable. The lungs are well aerated bilaterally. No focal infiltrate or sizable effusion is seen. No bony abnormality is noted.  IMPRESSION: No acute abnormality seen.   Electronically Signed   By: Alcide Clever M.D.   On: 03/17/2013 11:09   Ct Head Wo Contrast  03/17/2013   CLINICAL DATA:  Dizziness with nausea and vomiting  EXAM: CT HEAD WITHOUT CONTRAST  TECHNIQUE: Contiguous axial images were obtained from the base of the skull through the vertex without intravenous contrast. Study was obtained within 24 hr of patient's arrival at the emergency department.  COMPARISON:  February 23, 2013  FINDINGS: Mild diffuse atrophy is stable. There is somewhat greater atrophy in the anterior left temporal lobe and right temporal lobe, a stable finding. There is no apparent mass, hemorrhage, extra-axial fluid collection, or midline shift. There is evidence of a prior old infarct in the mid right cerebellum peripherally. There is evidence of a prior infarct involving the genu of the right internal capsule and a portion of the  right putamen. There is evidence of a prior small infarct in the genu of the left internal capsule. There is patchy small vessel disease in the centra semiovale bilaterally. There is no demonstrable acute infarct.  Bony calvarium appears intact. Mastoid air cells are clear. There is mild debris in each external auditory canal.  IMPRESSION: Atrophy with small vessel disease and prior infarcts as outlined above. No intracranial mass, hemorrhage, or acute appearing infarct. Probable cerumen in each external auditory canal.   Electronically Signed   By: Bretta Bang M.D.   On: 03/17/2013 11:27    EKG Interpretation  Date/Time:  Wednesday March 17 2013 10:19:43 EST Ventricular Rate:  49 PR Interval:  158 QRS Duration: 103 QT Interval:  477 QTC Calculation: 431 R Axis:   28 Text Interpretation:  Sinus bradycardia Ventricular premature complex nonspecific T wave changes Confirmed by CAMPOS  MD, KEVIN ([id number]) on 03/17/2013 11:01:57 AM            MDM   Final diagnoses:  Dizziness  Vertigo  Nausea   Pt presenting with dizziness, emesis, diaphoresis. He is in NAD. Had an episode of chest burning smilar to heartburn around 3 am lasting only a few seconds. Hx of stroke. Labs, CT head pending. Cannot r/o CAD in origin. Case discussed with attending Dr. Patria Mane who also evaluated patient and agrees with plan of care. 12:00 PM Labs without acute finding. CT head and chest x-ray unchanged. Patient still has symptoms despite being given fluids and Zofran. Will obtain a three-hour troponin along with MRI of brain to rule out any posterior fossa infarct. 3:55 PM MRI pending. 3-hour troponin normal. Pt signed out to Mellody Drown, PA-C at shift change. Plan d/c home if MRI normal.  Trevor Mace, PA-C 03/17/13 1556

## 2013-03-18 ENCOUNTER — Encounter: Payer: Self-pay | Admitting: Family Medicine

## 2013-03-18 ENCOUNTER — Ambulatory Visit (INDEPENDENT_AMBULATORY_CARE_PROVIDER_SITE_OTHER): Payer: Medicare HMO | Admitting: Family Medicine

## 2013-03-18 VITALS — BP 133/88 | HR 80 | Temp 97.9°F | Ht 76.0 in | Wt 212.0 lb

## 2013-03-18 DIAGNOSIS — H9190 Unspecified hearing loss, unspecified ear: Secondary | ICD-10-CM

## 2013-03-18 DIAGNOSIS — H9193 Unspecified hearing loss, bilateral: Secondary | ICD-10-CM | POA: Insufficient documentation

## 2013-03-18 DIAGNOSIS — R42 Dizziness and giddiness: Secondary | ICD-10-CM | POA: Insufficient documentation

## 2013-03-18 NOTE — Progress Notes (Signed)
   Subjective:    Patient ID: Brian Burns, male    DOB: 1945-02-20, 68 y.o.   MRN: [id number]  HPI: Pt presents to clinic for ED follow-up; he was seen yesterday in the ED for nausea / vomiting and sensation of room-spinning and dizziness. ED providers were concerned for stroke or cardiac etiology; EKG showed (no acute change) sinus bradycardia and nonspecific inferolateral T-wave abnormalities but troponins were negative. He had an MRI which showed no acute stroke; he does have remote lacunar / basal ganglia / brainstem infarcts and likely chronic microvascular diseaese. He was treated with fluids and Zofran, and was prescribed meclizine. His symptoms have been much better last night and today. He has a history of GERD did have some GERD-type chest pain (right sided, burning, "full"-feeling) about 3:30 this morning that got better with belching. He denies unsteadiness on his feet, weakness / pain / numbness / paralysis.  Of note, he has upcoming urology and cardiology specialist appointments. He has had no acute issues, otherwise.  Review of Systems: As above. Denies fever / chills, SOB, chest pressure, abdominal pain, N/V.      Objective:   Physical Exam BP 133/88  Pulse 80  Temp(Src) 97.9 F (36.6 C) (Oral)  Ht 6\' 4"  (1.93 m)  Wt 212 lb (96.163 kg)  BMI 25.82 kg/m2 Gen: well-appearing elderly adult male in NAD HEENT: [place] / AT, PERRLA, EOMI, TM's difficult to visualize secondary to cerumen but no frank impaction  MMM, no posterior oropharyngeal or nasal mucosae edema or erythema Neuro: overall no gross acute focal deficit  baseline dysarthria (difficult to understand) and hearing impairment but CN II-XII grossly intact otherwise  Alert / oriented, with normal finger-to-nose, rapid alternating movements of hands, and heel-shin  5/5 strength throughout all four extremities and sensation grossly intact distally in all four extremities  Unable to elicit room-spinning or other similar  symptoms as per HPI with Dix-Hallpike maneuvers Cardio: RRR, no murmur appreciated Pulm: CTAB, no wheeze Ext: warm, well-perfused, no LE edema    Assessment & Plan:

## 2013-03-18 NOTE — Assessment & Plan Note (Signed)
Also precepted with Dr. Leveda Anna. Chronic / not worse, and possible contribution of cerumen though pt does not have significant impaction and TMs appear grossly intact (poorly visualized). Referred to ENT for further evaluation / consideration for hearing aids. F/u as needed, otherwise.

## 2013-03-18 NOTE — Assessment & Plan Note (Signed)
Precepted with Dr. Leveda Anna and reviewed recent ED visit(s). Uncertain if current symptoms represent chronic infarct sequelae, acute viral syndrome, or vestibular system dysfunction / BPPV. Regardless, symptoms not elicited on exam today and overall improved with meclizine as prescribed by the ED. Plan to continue this medication for now and provided pt with handout on home Epley maneuver; if symptoms worsen / recur or do not improve, instructed pt to follow up sooner rather than later, and will consider vestibular rehab and/or formal neurology referral. Otherwise reviewed red flags / dangers of somnolence, etc, and signs / symptoms that would prompt immediate return to care.

## 2013-03-18 NOTE — Patient Instructions (Signed)
Thank you for coming in, today!  For your dizziness: I think this is due to your inner ear. You have not had a new stroke. Keep taking the meclazine for now. If/when you need a refill, call the office. You can also do the exercises that I'll show you. If your symptoms get worse instead of better, come back to see me.  I will put in a referral for you to see the ENT about your hearing. Otherwise, make sure you follow up with urology as scheduled.  Please feel free to call with any questions or concerns at any time, at [phone]. --Dr. Casper Harrison

## 2013-03-19 ENCOUNTER — Telehealth: Payer: Self-pay | Admitting: *Deleted

## 2013-03-19 NOTE — Telephone Encounter (Signed)
[facility] requested records and labs to be faxed for patient's appt 03/22/2013.  Done.  Faxed to Valinda Party at [phone].  Radene Ou, [place] '

## 2013-03-19 NOTE — Telephone Encounter (Signed)
Called pt. LMTC. We need pt's insurance card [facility]) in order to refer him to ENT. We also need the PCP on his [employer] Card. Waiting for call back.  Lorenda Hatchet, [place] Battles

## 2013-03-26 ENCOUNTER — Telehealth: Payer: Self-pay | Admitting: Family Medicine

## 2013-03-26 ENCOUNTER — Telehealth: Payer: Self-pay | Admitting: *Deleted

## 2013-03-26 MED ORDER — MECLIZINE HCL 25 MG PO TABS: 25.0000 mg | ORAL_TABLET | Freq: Two times a day (BID) | ORAL | Status: DC | PRN

## 2013-03-26 NOTE — Telephone Encounter (Signed)
Pt called and needs someone to call concerning a refill on one of his medications. jw

## 2013-03-26 NOTE — Telephone Encounter (Signed)
Called pt.

## 2013-03-26 NOTE — Telephone Encounter (Signed)
Called pt and he wants meclizine refilled.He said, that he has been trying to get a refill since last week. I told the pt, that Mellody Drown PA prescribed this medication. He will call L.Parker. Fwd. To PCP for review. Lorenda Hatchet, Renato Battles

## 2013-03-26 NOTE — Telephone Encounter (Signed)
Sent rx for meclizine to pharmacy and called pt and informed. Lorenda Hatchet, Renato Battles

## 2013-03-26 NOTE — Telephone Encounter (Signed)
Pt called and needs someone to call concerning a refill on one of his medications. jw °

## 2013-03-26 NOTE — Telephone Encounter (Signed)
I haven't seen a phone call in the last week requesting a refill, but I did tell him at his last visit that I would be happy to refill his meclizine. If he calls again, I will call in a prescription (#30, 1 refill), or it can be called in using my NPI. Thanks! --CMS

## 2013-04-09 ENCOUNTER — Encounter: Payer: Self-pay | Admitting: Family Medicine

## 2013-04-09 ENCOUNTER — Ambulatory Visit (INDEPENDENT_AMBULATORY_CARE_PROVIDER_SITE_OTHER): Payer: Commercial Managed Care - HMO | Admitting: Family Medicine

## 2013-04-09 VITALS — BP 117/77 | HR 75 | Temp 98.1°F | Wt 220.0 lb

## 2013-04-09 DIAGNOSIS — J029 Acute pharyngitis, unspecified: Secondary | ICD-10-CM

## 2013-04-09 DIAGNOSIS — R070 Pain in throat: Secondary | ICD-10-CM

## 2013-04-09 MED ORDER — GUAIFENESIN ER 600 MG PO TB12: 600.0000 mg | ORAL_TABLET | Freq: Two times a day (BID) | ORAL | Status: DC | PRN

## 2013-04-09 MED ORDER — SALINE SPRAY 0.65 % NA SOLN: 1.0000 | NASAL | Status: DC | PRN

## 2013-04-09 NOTE — Patient Instructions (Signed)
Mr. Wildy,  It was very nice to meet you. Thank you for coming in today. Your exam is normal. No signs of throat infection. For feeling phlegm: Drink plenty of water Nasal saline, use in both nostrils, especially right. Mucinex.   Concerns to discuss with PCP:  Poor energy at baseline Constipation Nocturia 3-4x per night  Please f/u with Dr. Casper Harrison in 2 weeks.   Dr. Armen Pickup

## 2013-04-09 NOTE — Progress Notes (Signed)
   Subjective:    Patient ID: Brian Burns, male    DOB: 07-27-1945, 69 y.o.   MRN: [id number]  HPI 68 yo M presents for SD visit:  1. Sore throat: x 3 days. Pain is frontal neck. Feels like phlegm stuck in throat. No oral pain. No frequent heartburn. No dysphagia. No fever, chills, cough, SOB, sick contacts. A bit better from onset. No treatment.   Concerns deferred to PCP f/u:  Poor energy at baseline Constipation Nocturia 3-4x per night   Soc Hx: ex-smoker and drinker quit in 2006.  Review of Systems     Objective:   Physical Exam BP 117/77  Pulse 75  Temp(Src) 98.1 F (36.7 C) (Oral)  Wt 220 lb (99.791 kg) General appearance: alert, cooperative and no distress Nose: no discharge, right turbinate pink, swollen, left turbinate normal Throat: edentulous,  lips, mucosa, and tongue normal;  gums normal Neck: no adenopathy, no carotid bruit, no JVD, supple, symmetrical, trachea midline and thyroid not enlarged, symmetric, no tenderness/mass/nodules Lungs: clear to auscultation bilaterally Heart: regular rate and rhythm, S1, S2 normal, no murmur, click, rub or gallop       Assessment & Plan:

## 2013-04-09 NOTE — Assessment & Plan Note (Signed)
Your exam is normal. No signs of throat infection. For feeling phlegm: Drink plenty of water Nasal saline, use in both nostrils, especially right. Mucinex.

## 2013-04-21 ENCOUNTER — Encounter: Payer: Self-pay | Admitting: Family Medicine

## 2013-04-21 ENCOUNTER — Ambulatory Visit (INDEPENDENT_AMBULATORY_CARE_PROVIDER_SITE_OTHER): Payer: Medicare HMO | Admitting: Family Medicine

## 2013-04-21 VITALS — BP 121/70 | HR 62 | Temp 97.9°F | Wt 225.1 lb

## 2013-04-21 DIAGNOSIS — R070 Pain in throat: Secondary | ICD-10-CM

## 2013-04-21 DIAGNOSIS — J988 Other specified respiratory disorders: Secondary | ICD-10-CM

## 2013-04-21 MED ORDER — AZITHROMYCIN 250 MG PO TABS: ORAL_TABLET | ORAL | Status: DC

## 2013-04-21 NOTE — Assessment & Plan Note (Signed)
Slightly vague symptoms with prolonged course, without definite physical findings to suggest frank serious infection; however, there is some questionable tonsillar exudate and pt does have some difference in breath sounds side-to-side on auscultation of lungs, and has had no improvement with conservative therapy. Will treat with azithromycin for 5 days, 500 mg day 1, 250 mg days 2-5. Otherwise continue Mucinex, push fluids. See also throat discomfort problem list note. F/u as needed.

## 2013-04-21 NOTE — Progress Notes (Signed)
   Subjective:    Patient ID: Brian Burns, male    DOB: September 25, 1945, 68 y.o.   MRN: [id number]  HPI: Pt presents to clinic for f/u of sore throat and congestion. He was recently seen by Dr. Armen Pickup and has had no relief with Mucinex. He complains of discomfort, tightness / soreness in the "front" part of his throat. His throat itself has been sore for over a week, as well. He continues to feel like he has phlegm "stuck" in his throat and chest. He dies pain in his mouth / teeth, unusual taste, heartburn-type symptoms, or difficulty breathing or swallowing. He continues to deny fever / chills, N/V, cough, or SOB.  Pt also complains of constipation, which is not new, but he has not tried any medications, over the counter or otherwise. Note pt has a pending ENT referral but has not brought in his insurance card for referral coordinator to be able to process the referral properly.   Review of Systems: As above.     Objective:   Physical Exam BP 121/70  Pulse 62  Temp(Src) 97.9 F (36.6 C) (Oral)  Wt 225 lb 1.6 oz (102.105 kg) Gen: well-appearing adult male in NAD; some slurring of words / difficult to understand enunciation, at baseline HEENT: [place] / AT, EOMI, PERRLA, TM's clear  Nasal mucosae mildly red, posterior oropharynx slightly erythematous / minimally edematous  ?whitish patch on right tonsil without frank tonsillar swelling  Few shotty ant cervical lymph nodes, no discrete soft tissue masses or tenderness of anterior neck appreciated Cardio: RRR, no murmur appreciated Pulm: upper fields clear, diminished breath sounds at bases  No frank crackles or rhonchi, though breath sounds in midlung slightly more diminished R > L Ext: warm, well-perfused, no LE edema     Assessment & Plan:

## 2013-04-21 NOTE — Assessment & Plan Note (Signed)
Uncertain what this represents. Thyroid not tender and not enlarged, though there are a few ant cervical lymph nodes and some questionable tonsillar exudates, so atypical infection could explain some of pt's discomfort. Rx for azithro per resp infection problem list note. Pt had a previously pended ENT referral for hearing problems but needs to bring in his insurance card to verify correct information before the referral can be processed. Pt will bring in his card and then referral will be placed; instructed pt that if his throat discomfort does not improve with treatment for possible infection, ENT referral can be altered to include this complaint so pt can be evaluated for possible laryngoscopy, etc. Otherwise, f/u as needed.

## 2013-04-21 NOTE — Patient Instructions (Addendum)
Thank you for coming in, today!  I want you to take an antibiotic for 5 days. It is azithromycin. Take two pills the first day (500 mg), then one pill (250 mg) on days 2-5.  Bring in your insurance cards so we can scan them. If it doesn't have the right on it, you will need to call them to get one of our doctors on the card.  Take Miralax or Colace over the counter for constipation.  Come back to see me as you need. Please feel free to call with any questions or concerns at any time, at [phone]. --Dr. Casper Harrison

## 2013-04-26 ENCOUNTER — Other Ambulatory Visit: Payer: Self-pay | Admitting: *Deleted

## 2013-04-26 MED ORDER — FINASTERIDE 5 MG PO TABS: 5.0000 mg | ORAL_TABLET | Freq: Every day | ORAL | Status: DC

## 2013-04-26 MED ORDER — MECLIZINE HCL 25 MG PO TABS: 25.0000 mg | ORAL_TABLET | Freq: Two times a day (BID) | ORAL | Status: DC | PRN

## 2013-05-06 ENCOUNTER — Telehealth: Payer: Self-pay | Admitting: Family Medicine

## 2013-05-06 NOTE — Telephone Encounter (Signed)
Referral was previously ordered but has been stalled due to not having the correct insurance card. Referral is for throat discomfort (ICD-9 748.1) and hearing loss (ICD-9 389.9). Will forward to referral coordinator. [place], if a new order needs to be placed, please let me know. I looked at the scanned card (dated 3/27, which I suppose is correct and labels Dr. Deirdre Priest as his PCP -- not sure if he didn't tell someone on that day and just called back today, or what). Thanks very much. --CMS

## 2013-05-06 NOTE — Telephone Encounter (Signed)
Patient brings insurance card into office. Requesting referral to ENT.

## 2013-05-13 ENCOUNTER — Telehealth: Payer: Self-pay | Admitting: Family Medicine

## 2013-05-13 NOTE — Telephone Encounter (Signed)
Pt called and needs a refill on his Tramadol called in or left up front for pick up. jw

## 2013-05-13 NOTE — Telephone Encounter (Signed)
Will FWD to MD.  Mouhamed Glassco L Nyjae Hodge, CMA    

## 2013-05-14 ENCOUNTER — Other Ambulatory Visit: Payer: Self-pay | Admitting: *Deleted

## 2013-05-14 MED ORDER — TRAMADOL HCL 50 MG PO TABS: 50.0000 mg | ORAL_TABLET | Freq: Three times a day (TID) | ORAL | Status: DC | PRN

## 2013-05-14 NOTE — Telephone Encounter (Signed)
Pt notified.  Destry Dauber L Geo Slone, CMA  

## 2013-05-14 NOTE — Telephone Encounter (Signed)
Rx printed and left at front desk. Please call pt to let him know he can pick it up at his convenience. Thanks! --CMS

## 2013-05-19 NOTE — Telephone Encounter (Signed)
Referral faxed to [facility]. Brian Burns will call pt to inform appt time.  [place]

## 2013-05-19 NOTE — Telephone Encounter (Signed)
Called pt. appt with Dr.Bates 05/27/13 at 1:30 pm. [address] Pt repeated info back to me. Info also given to [place]. Brian Burns

## 2013-05-28 ENCOUNTER — Encounter: Payer: Self-pay | Admitting: Emergency Medicine

## 2013-05-28 ENCOUNTER — Ambulatory Visit (INDEPENDENT_AMBULATORY_CARE_PROVIDER_SITE_OTHER): Payer: Commercial Managed Care - HMO | Admitting: Emergency Medicine

## 2013-05-28 VITALS — BP 153/85 | HR 102 | Temp 97.4°F | Ht 76.0 in | Wt 227.5 lb

## 2013-05-28 DIAGNOSIS — R6883 Chills (without fever): Secondary | ICD-10-CM

## 2013-05-28 LAB — COMPLETE METABOLIC PANEL WITH GFR
ALT: 17 U/L (ref 0–53)
AST: 22 U/L (ref 0–37)
Albumin: 4.2 g/dL (ref 3.5–5.2)
Alkaline Phosphatase: 107 U/L (ref 39–117)
BUN: 12 mg/dL (ref 6–23)
CALCIUM: 8.8 mg/dL (ref 8.4–10.5)
CHLORIDE: 102 meq/L (ref 96–112)
CO2: 29 mEq/L (ref 19–32)
Creat: 1.08 mg/dL (ref 0.50–1.35)
GFR, EST AFRICAN AMERICAN: 82 mL/min
GFR, Est Non African American: 71 mL/min
Glucose, Bld: 124 mg/dL — ABNORMAL HIGH (ref 70–99)
Potassium: 3.5 mEq/L (ref 3.5–5.3)
SODIUM: 137 meq/L (ref 135–145)
TOTAL PROTEIN: 7 g/dL (ref 6.0–8.3)
Total Bilirubin: 0.7 mg/dL (ref 0.2–1.2)

## 2013-05-28 LAB — CBC WITH DIFFERENTIAL/PLATELET
Basophils Absolute: 0 10*3/uL (ref 0.0–0.1)
Basophils Relative: 0 % (ref 0–1)
EOS PCT: 0 % (ref 0–5)
Eosinophils Absolute: 0 10*3/uL (ref 0.0–0.7)
HCT: 34.3 % — ABNORMAL LOW (ref 39.0–52.0)
Hemoglobin: 11.6 g/dL — ABNORMAL LOW (ref 13.0–17.0)
LYMPHS PCT: 9 % — AB (ref 12–46)
Lymphs Abs: 1 10*3/uL (ref 0.7–4.0)
MCH: 27.2 pg (ref 26.0–34.0)
MCHC: 33.8 g/dL (ref 30.0–36.0)
MCV: 80.5 fL (ref 78.0–100.0)
Monocytes Absolute: 0.7 10*3/uL (ref 0.1–1.0)
Monocytes Relative: 6 % (ref 3–12)
NEUTROS ABS: 9.3 10*3/uL — AB (ref 1.7–7.7)
NEUTROS PCT: 85 % — AB (ref 43–77)
PLATELETS: 166 10*3/uL (ref 150–400)
RBC: 4.26 MIL/uL (ref 4.22–5.81)
RDW: 15.9 % — ABNORMAL HIGH (ref 11.5–15.5)
WBC: 10.9 10*3/uL — AB (ref 4.0–10.5)

## 2013-05-28 LAB — TSH: TSH: 1.907 u[IU]/mL (ref 0.350–4.500)

## 2013-05-28 LAB — POCT SEDIMENTATION RATE: POCT SED RATE: 24 mm/h — AB (ref 0–22)

## 2013-05-28 LAB — CK: Total CK: 274 U/L — ABNORMAL HIGH (ref 7–232)

## 2013-05-28 NOTE — Assessment & Plan Note (Addendum)
No history or exam findings concerning for infection. DDx includes low grade viral infection, thyroid dysfunction, anemia. Could also consider autoimmune disease. With vague complaints of chills and body aches, will check some blood work - cbc, cmp, ck, esr, tsh. F/u with PCP in 1-2 weeks to go over results.

## 2013-05-28 NOTE — Patient Instructions (Signed)
It was nice to meet you!  I'm not sure why you are having chills. I do not see anything that looks like an infection. We are going to check some blood work today.  Please follow up with Dr. Casper Harrison in 1-2 weeks to go over the results and discuss your knees.

## 2013-05-28 NOTE — Progress Notes (Signed)
   Subjective:    Patient ID: Brian Burns, male    DOB: May 03, 1945, 68 y.o.   MRN: [id number]  HPI Brian Burns is here for a SDA for cold chills.  He reports getting cold chills over all his body today, lasting for about 2 hours.  He states this has happened before near the start of the year.  They resolve on their own.  No fevers.  He does report some cough, but no shortness of breath, rhinorrhea, wheezing.  He also reports chronic back/knee pain that is not particularly worse than normal.  Has muscle aches, primarily in his upper arms.  No nausea, vomiting, diarrhea, abdominal pain.  No hematuria or blood in stool.  No palpitations.  No association of chills with pain level.  No skin/hair/nail changes.  Current Outpatient Prescriptions on File Prior to Visit  Medication Sig Dispense Refill  . amLODipine (NORVASC) 10 MG tablet Take 1 tablet (10 mg total) by mouth daily.  90 tablet  1  . aspirin 81 MG tablet Take 81 mg by mouth daily.      Marland Kitchen atorvastatin (LIPITOR) 40 MG tablet Take 1 tablet (40 mg total) by mouth daily.  90 tablet  1  . azithromycin (ZITHROMAX) 250 MG tablet Take two pills on the first day, then one pill once a day for 4 more days.  6 tablet  0  . finasteride (PROSCAR) 5 MG tablet Take 1 tablet (5 mg total) by mouth daily.  30 tablet  1  . gabapentin (NEURONTIN) 100 MG capsule Take 100 mg by mouth 3 (three) times daily as needed. Nerve pain.      Marland Kitchen guaiFENesin (MUCINEX) 600 MG 12 hr tablet Take 1 tablet (600 mg total) by mouth 2 (two) times daily as needed for to loosen phlegm.  60 tablet  0  . meclizine (ANTIVERT) 25 MG tablet Take 1 tablet (25 mg total) by mouth 2 (two) times daily as needed for dizziness.  30 tablet  2  . ondansetron (ZOFRAN ODT) 4 MG disintegrating tablet Take 1 tablet (4 mg total) by mouth every 8 (eight) hours as needed for nausea or vomiting.  20 tablet  0  . sodium chloride (OCEAN) 0.65 % SOLN nasal spray Place 1 spray into both nostrils as needed  for congestion.  60 mL  0  . traMADol (ULTRAM) 50 MG tablet Take 1-2 tablets (50-100 mg total) by mouth every 8 (eight) hours as needed for moderate pain.  50 tablet  1   No current facility-administered medications on file prior to visit.    I have reviewed and updated the following as appropriate: allergies and current medications SHx: former smoker   Review of Systems See HPI    Objective:   Physical Exam BP 153/85  Pulse 102  Temp(Src) 97.4 F (36.3 C) (Oral)  Ht 6\' 4"  (1.93 m)  Wt 227 lb 8 oz (103.193 kg)  BMI 27.70 kg/m2 Gen: alert, cooperative, NAD HEENT: AT/[place], sclera white, MMM, no pharyngeal erythema or exudate Neck: supple, no LAD CV: RRR, no murmurs, pulse around 90 on ascultation Pulm: CTAB, no wheezes or rales Skin: normal      Assessment & Plan:

## 2013-06-23 ENCOUNTER — Encounter: Payer: Self-pay | Admitting: Vascular Surgery

## 2013-06-24 ENCOUNTER — Ambulatory Visit (HOSPITAL_COMMUNITY)
Admission: RE | Admit: 2013-06-24 | Discharge: 2013-06-24 | Disposition: A | Discharge status: Home / Self Care | Payer: Medicare HMO | Source: Ambulatory Visit | Attending: Vascular Surgery | Admitting: Vascular Surgery

## 2013-06-24 ENCOUNTER — Encounter: Payer: Self-pay | Admitting: Vascular Surgery

## 2013-06-24 ENCOUNTER — Ambulatory Visit (INDEPENDENT_AMBULATORY_CARE_PROVIDER_SITE_OTHER)
Admission: RE | Admit: 2013-06-24 | Discharge: 2013-06-24 | Disposition: A | Discharge status: Home / Self Care | Payer: Medicare HMO | Source: Ambulatory Visit | Attending: Vascular Surgery | Admitting: Vascular Surgery

## 2013-06-24 ENCOUNTER — Ambulatory Visit (INDEPENDENT_AMBULATORY_CARE_PROVIDER_SITE_OTHER): Payer: Commercial Managed Care - HMO | Admitting: Vascular Surgery

## 2013-06-24 VITALS — BP 133/86 | HR 51 | Ht 76.0 in | Wt 227.0 lb

## 2013-06-24 DIAGNOSIS — I714 Abdominal aortic aneurysm, without rupture, unspecified: Secondary | ICD-10-CM

## 2013-06-24 DIAGNOSIS — M79609 Pain in unspecified limb: Secondary | ICD-10-CM

## 2013-06-24 DIAGNOSIS — I739 Peripheral vascular disease, unspecified: Secondary | ICD-10-CM

## 2013-06-24 NOTE — Progress Notes (Signed)
[facility] [place] HISTORY AND PHYSICAL   CC:  Both thighs are painful   [name], Gasper Lloyd, MD  HPI: This is a 68 y.o. male  Who presents today with complaints of bilateral thigh pain.  he had similar complaints one year ago. He states that it will ease off with rest, but is constant.  One leg is not any worse than the other and nothing really makes it any better.  The pain is indiscriminate and occurs whether he is ambulating or sitting. He states that he also has some lower back pain that eases a little with laying down, otherwise, nothing alleviates the pain.  He was seen in our office in December with ABI's and duplex of AAA.  His ABI's at that time were 0.91 on the right and 0.76 on the left.  The duplex of AAA revealed a size of 3.1cm.  He does not smoke and has a remote tobacco history.  He does have hx of CVA x 2 in 2010 and 2013 with no residual. His symptoms are very similar to his previous office visit in June of 2013. He does not describe rest pain. He has no wounds on his feet. Chronic medical problems include hypertension, hyperlipidemia, chronic back pain, coronary disease of which are currently stable.    Past Medical History   Diagnosis  Date   .  Hypertension     .  HLD (hyperlipidemia)     .  Erectile dysfunction     .  Chronic back pain     .  CAD (coronary artery disease)     .  Carotid artery disease     .  PVD (peripheral vascular disease) with claudication     .  CVA (cerebrovascular accident)  5/09       Rt sided   .  Stroke  01/06/2012      Past Surgical History   Procedure  Laterality  Date   .  Eye surgery  Left  April 2014       Cataract   .  Eye surgery  Right  Jun 16, 2012       Cataract by Dr. Elmer Picker       Allergies   Allergen  Reactions   .  Novocain (Procaine Hcl)         Current Outpatient Prescriptions   Medication  Sig  Dispense  Refill   .  acetaminophen (TYLENOL) 325 MG tablet  Take 2 tablets (650 mg total) by mouth every 6  (six) hours as needed for pain or fever.         Marland Kitchen  amLODipine (NORVASC) 10 MG tablet  Take 1 tablet (10 mg total) by mouth daily.   90 tablet   1   .  atorvastatin (LIPITOR) 40 MG tablet  Take 1 tablet (40 mg total) by mouth daily.   90 tablet   1   .  brimonidine (ALPHAGAN) 0.15 % ophthalmic solution  Place into the left eye 4 (four) times daily.         Marland Kitchen  gabapentin (NEURONTIN) 100 MG capsule  Take 1 capsule (100 mg total) by mouth 3 (three) times daily.   180 capsule   1   .  ketorolac (ACULAR) 0.4 % SOLN  Place into the left eye 4 (four) times daily.         Marland Kitchen  latanoprost (XALATAN) 0.005 % ophthalmic solution  Place 1 drop into the left  eye 4 (four) times daily.         Marland Kitchen.  lisinopril (PRINIVIL,ZESTRIL) 10 MG tablet  Take 1 tablet (10 mg total) by mouth daily.   30 tablet   3   .  ofloxacin (OCUFLOX) 0.3 % ophthalmic solution  Place 1 drop into the left eye 4 (four) times daily.         .  prednisoLONE acetate (PRED FORTE) 1 % ophthalmic suspension  Place 1 drop into the left eye 4 (four) times daily.         .  tamsulosin (FLOMAX) 0.4 MG CAPS  Take 0.4 mg by mouth 2 (two) times daily.          .  traMADol (ULTRAM) 50 MG tablet  Take 1 tablet (50 mg total) by mouth every 8 (eight) hours as needed for pain.   90 tablet   2      No current facility-administered medications for this visit.       Family History   Problem  Relation  Age of Onset   .  Lung disease  Brother     .  Kidney disease  Brother     .  Heart disease  Brother         died at 6758   .  Heart disease  Mother     .  Kidney disease  Mother     .  Lung disease  Mother         History      Social History   .  Marital Status:  Married       Spouse Name:  N/A       Number of Children:  2    .  Years of Education:  N/A      Occupational History   .   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  [employer]         on the weekends      Social History Main Topics   .  Smoking status:  Former Smoker --  0.50 packs/day for 40 years       Types:  Cigarettes       Quit date:  01/29/2004   .  Smokeless tobacco:  Never Used   .  Alcohol Use:  No   .  Drug Use:  No   .  Sexually Active:  Not on file      Other Topics  Concern   .  Not on file      Social History Narrative   .  No narrative on file      ROS: [x]  Positive   [ ]  Negative   [ ]  All sytems reviewed and are negative  General: [ ]  Weight loss, [ ]  Fever, [ ]  chills Neurologic: [ ]  Dizziness, [ ]  Blackouts, [ ]  Seizure [x ] Stroke, [ ]  "Mini stroke", [ ]  Slurred speech, [ ]  Temporary blindness; [ ]  weakness in arms or legs, [ ]  Hoarseness Cardiac: [ ]  Chest pain/pressure, [ ]  Shortness of breath at rest [x ] Shortness of breath with exertion, [ ]  Atrial fibrillation or irregular heartbeat Vascular: [x ] Pain in legs with walking, [x ] Pain in legs at rest, [ ]  Pain in legs at night,  [ ]  Non-healing ulcer, [ ]  Blood clot in vein/DVT,    Pulmonary: [ ]  Home oxygen, [ ]  Productive cough, [ ]   Coughing up blood, [ ]  Asthma,  [ ]  Wheezing Musculoskeletal:  [ ]  Arthritis, [x ] Low back pain, [ ]  Joint pain Hematologic: [ ]  Easy Bruising, [ ]  Anemia; [ ]  Hepatitis Gastrointestinal: [ ]  Blood in stool, [ ]  Gastroesophageal Reflux/heartburn, [ ]  Trouble swallowing Urinary: [ ]  chronic Kidney disease, [ ]  on HD - [ ]  MWF or [ ]  TTHS, [ ]  Burning with urination, [ ]  Difficulty urinating Endocrine: [ ]  hx of diabetes, [ ]  hx of thyroid disease Skin: [ ]  Rashes, [ ]  Wounds Psychological: [ ]  Anxiety, [ ]  Depression    PHYSICAL EXAMINATION:     Filed Vitals:   06/24/13 0943  BP: 133/86  Pulse: 51  Height: 6\' 4"  (1.93 m)  Weight: 227 lb (102.967 kg)  SpO2: 100%    General:  WDWN in NAD Gait: Normal HENT: WNL Pulmonary: normal non-labored breathing , without Rales, rhonchi,  wheezing Cardiac: RRR, without  Murmurs, rubs or gallops Abdomen: soft, NT, no masses Skin: no rashes, ulcers noted Vascular Exam/Pulses: + palpable  femoral pulses bilaterally, 2+ popliteal pulses   no palpable pedal pulse but foot is warm and well perfused. Could feel the pulse of aorta, but could not appreciate a pulsatile mass in his abdomen. Extremities: without ischemic changes, no Gangrene , no cellulitis; no open wounds;  Musculoskeletal: no muscle wasting or atrophy       Neurologic: A&O X 3; Appropriate Affect ; SENSATION: normal; MOTOR FUNCTION:  moving all extremities equally. Speech is fluent/normal   Non-Invasive Vascular Imaging:  ABI's 06/24/13 Right:  .94 Left:  .81, the patient also had an ultrasound of his abdominal aorta. This showed his aneurysm diameter is currently 3.3 cm which is essentially unchanged from 3.3 cm in 2013. I reviewed and interpreted both of these studies.  ASSESSMENT/PLAN: 68 y.o. male with complaints of bilateral thigh pain and lower back pain.  His ABI's are essentially unchanged from his ABI's 1 and 2 years ago.  The pain he describes is not consistent with claudication. Small abdominal aortic aneurysm unchanged over the last year  -will have him seen by Dr. Shon Baton with [facility] orthopedics regarding his back to see if his symptoms may improve with an intervention. -we will have him f/u in 1 year with duplex of AAA and ABI's.  Fabienne Bruns, MD [facility] [place] Office: [phone] Pager: [phone]

## 2013-06-29 ENCOUNTER — Telehealth: Payer: Self-pay | Admitting: *Deleted

## 2013-06-29 DIAGNOSIS — M549 Dorsalgia, unspecified: Secondary | ICD-10-CM

## 2013-06-29 NOTE — Addendum Note (Signed)
Addended by: Bobbye Morton on: 06/29/2013 04:59 PM   Modules accepted: Orders

## 2013-06-29 NOTE — Telephone Encounter (Signed)
Not sure if referral is already ordered, but placed order, today. Pt reportedly already has appointment, so the order / referral process may just need to be processed. Please let me know if there's anything else I need to do. Thanks. --CMS

## 2013-06-29 NOTE — Telephone Encounter (Signed)
Need referral place with [employer].  Pt has appt on 07/01/2013 with Zonia Kief, PA at [facility] for back pain.  Clovis Pu, RN

## 2013-06-30 ENCOUNTER — Encounter: Payer: Self-pay | Admitting: Family Medicine

## 2013-06-30 ENCOUNTER — Ambulatory Visit (INDEPENDENT_AMBULATORY_CARE_PROVIDER_SITE_OTHER): Payer: Commercial Managed Care - HMO | Admitting: Family Medicine

## 2013-06-30 VITALS — BP 133/76 | HR 58 | Temp 98.1°F | Wt 226.0 lb

## 2013-06-30 DIAGNOSIS — M25561 Pain in right knee: Secondary | ICD-10-CM

## 2013-06-30 DIAGNOSIS — M25562 Pain in left knee: Principal | ICD-10-CM

## 2013-06-30 DIAGNOSIS — M25569 Pain in unspecified knee: Secondary | ICD-10-CM

## 2013-06-30 NOTE — Progress Notes (Signed)
   Subjective:    Patient ID: Brian Burns, male    DOB: 01-03-1946, 68 y.o.   MRN: [id number]  HPI: Pt presents with complaint of bilateral knee pain. Pt has had knee pain for years and usually takes tramadol, which has not been helping as much as normal. Pain has been worse for about a month, currently 7/10, worse with activity. Pt states his knees feel like they're going to give out, but he denies clicking / popping or catching. He is unable to stand or walk for long periods. He denies fevers / chills / rashes or redness, swelling, or skin changes around his knees. He denies changes in his meds or new medications.  Of note, he has had some increased back pain recently, and has an appointment with orthopedics in two days for that. He has never seen an orthopedist for knee or back pain.  Review of Systems: As above.     Objective:   Physical Exam BP 133/76  Pulse 58  Temp(Src) 98.1 F (36.7 C) (Oral)  Wt 226 lb (102.513 kg) Gen: well-appearing adult male in NAD MSK: bilateral knees normal to inspection, no gross deformity  No warmth or redness to bilateral knees, no effusions  Mild diffuse tenderness to palpation, but not localizable  No specific joint line tenderness, with full ROM bilaterally and preserved LE strength  Negative pain with varus and valgus stress to knees bilaterally  Pt able to stand / walk without assistance.     Assessment & Plan:

## 2013-06-30 NOTE — Assessment & Plan Note (Signed)
Likely osteoarthritis, though previous xrays in 2013 were negative. Exam is equivocal and does not show any frank obvious pathology. Discussed obtaining xrays or injecting knees with pt; pt has an appointment with orthopedics for chronic back pain later this week and states he prefers to wait to be seen by orthopedics and will discuss knee pain with them, to avoid having repeat / multiple xrays if not needed, and to avoid interfering with orthopedics management, if other injection / therapy is pursued by their providers. Referral placed to ortho specifying back pain, but will add knee pain to reason for referral, then follow up as needed after pt sees ortho. In the meantime, advised conservative measures (rest, elevation, stretching) and addition of Tylenol to chronic tramadol use.

## 2013-06-30 NOTE — Patient Instructions (Signed)
Thank you for coming in, today!  Keep your appointment with orthopedics. We will put the referral in. If there is a delay with the paperwork, the ortho office might reschedule you. They probably will do xrays and they might consider medications or injections.  In the meantime, you can take Tylenol with the tramadol. Don't take more than 3000 mg (3 g) of Tylenol in 1 day.  If your pain gets worse instead of better, or if you have new symptoms, call or go to the emergency room. Follow up with me as you need, after you see orthopedics. Please feel free to call with any questions or concerns at any time, at [phone]. --Dr. Casper Harrison

## 2013-07-16 ENCOUNTER — Other Ambulatory Visit: Payer: Self-pay | Admitting: *Deleted

## 2013-07-18 ENCOUNTER — Telehealth: Payer: Self-pay | Admitting: Family Medicine

## 2013-07-18 MED ORDER — TRAMADOL HCL 50 MG PO TABS: 50.0000 mg | ORAL_TABLET | Freq: Three times a day (TID) | ORAL | Status: DC | PRN

## 2013-07-18 NOTE — Telephone Encounter (Signed)
Please call pt to let him know his tramadol Rx is ready to pick up (requires a printed prescription). He can pick it up at the front desk. Thanks! --CMS

## 2013-07-19 NOTE — Telephone Encounter (Signed)
Called and informed patient that his Rx for Tramadol is ready for him to pick up.Busick, Rodena Medin

## 2013-08-02 ENCOUNTER — Other Ambulatory Visit: Payer: Self-pay | Admitting: Family Medicine

## 2013-08-20 ENCOUNTER — Encounter (HOSPITAL_COMMUNITY): Payer: Self-pay | Admitting: Emergency Medicine

## 2013-08-20 ENCOUNTER — Emergency Department (HOSPITAL_COMMUNITY)
Admission: EM | Admit: 2013-08-20 | Discharge: 2013-08-20 | Disposition: A | Discharge status: Home / Self Care | Payer: Medicare HMO | Attending: Emergency Medicine | Admitting: Emergency Medicine

## 2013-08-20 DIAGNOSIS — R42 Dizziness and giddiness: Secondary | ICD-10-CM | POA: Insufficient documentation

## 2013-08-20 DIAGNOSIS — Z8673 Personal history of transient ischemic attack (TIA), and cerebral infarction without residual deficits: Secondary | ICD-10-CM | POA: Diagnosis not present

## 2013-08-20 DIAGNOSIS — G8929 Other chronic pain: Secondary | ICD-10-CM | POA: Diagnosis not present

## 2013-08-20 DIAGNOSIS — Z87891 Personal history of nicotine dependence: Secondary | ICD-10-CM | POA: Diagnosis not present

## 2013-08-20 DIAGNOSIS — R51 Headache: Secondary | ICD-10-CM | POA: Insufficient documentation

## 2013-08-20 DIAGNOSIS — K5900 Constipation, unspecified: Secondary | ICD-10-CM | POA: Diagnosis not present

## 2013-08-20 DIAGNOSIS — M129 Arthropathy, unspecified: Secondary | ICD-10-CM | POA: Diagnosis not present

## 2013-08-20 DIAGNOSIS — I2510 Atherosclerotic heart disease of native coronary artery without angina pectoris: Secondary | ICD-10-CM | POA: Insufficient documentation

## 2013-08-20 DIAGNOSIS — Z7982 Long term (current) use of aspirin: Secondary | ICD-10-CM | POA: Diagnosis not present

## 2013-08-20 DIAGNOSIS — Z79899 Other long term (current) drug therapy: Secondary | ICD-10-CM | POA: Insufficient documentation

## 2013-08-20 DIAGNOSIS — I10 Essential (primary) hypertension: Secondary | ICD-10-CM | POA: Insufficient documentation

## 2013-08-20 DIAGNOSIS — Z87448 Personal history of other diseases of urinary system: Secondary | ICD-10-CM | POA: Diagnosis not present

## 2013-08-20 DIAGNOSIS — E785 Hyperlipidemia, unspecified: Secondary | ICD-10-CM | POA: Diagnosis not present

## 2013-08-20 LAB — COMPREHENSIVE METABOLIC PANEL
ALT: 21 U/L (ref 0–53)
AST: 28 U/L (ref 0–37)
Albumin: 3.7 g/dL (ref 3.5–5.2)
Alkaline Phosphatase: 104 U/L (ref 39–117)
Anion gap: 13 (ref 5–15)
BILIRUBIN TOTAL: 0.4 mg/dL (ref 0.3–1.2)
BUN: 13 mg/dL (ref 6–23)
CHLORIDE: 101 meq/L (ref 96–112)
CO2: 26 mEq/L (ref 19–32)
Calcium: 8.8 mg/dL (ref 8.4–10.5)
Creatinine, Ser: 1.06 mg/dL (ref 0.50–1.35)
GFR calc non Af Amer: 71 mL/min — ABNORMAL LOW (ref >90)
GFR, EST AFRICAN AMERICAN: 83 mL/min — AB (ref >90)
GLUCOSE: 162 mg/dL — AB (ref 70–99)
Potassium: 3.3 mEq/L — ABNORMAL LOW (ref 3.7–5.3)
SODIUM: 140 meq/L (ref 137–147)
TOTAL PROTEIN: 7.2 g/dL (ref 6.0–8.3)

## 2013-08-20 LAB — I-STAT TROPONIN, ED: Troponin i, poc: 0 ng/mL (ref 0.00–0.08)

## 2013-08-20 LAB — CBC
HCT: 37.4 % — ABNORMAL LOW (ref 39.0–52.0)
HEMOGLOBIN: 12.2 g/dL — AB (ref 13.0–17.0)
MCH: 28.2 pg (ref 26.0–34.0)
MCHC: 32.6 g/dL (ref 30.0–36.0)
MCV: 86.6 fL (ref 78.0–100.0)
PLATELETS: 138 10*3/uL — AB (ref 150–400)
RBC: 4.32 MIL/uL (ref 4.22–5.81)
RDW: 15.1 % (ref 11.5–15.5)
WBC: 8.9 10*3/uL (ref 4.0–10.5)

## 2013-08-20 LAB — I-STAT CHEM 8, ED
BUN: 13 mg/dL (ref 6–23)
Calcium, Ion: 1.23 mmol/L (ref 1.13–1.30)
Chloride: 101 mEq/L (ref 96–112)
Creatinine, Ser: 1.2 mg/dL (ref 0.50–1.35)
GLUCOSE: 167 mg/dL — AB (ref 70–99)
HCT: 40 % (ref 39.0–52.0)
Hemoglobin: 13.6 g/dL (ref 13.0–17.0)
Potassium: 3.2 mEq/L — ABNORMAL LOW (ref 3.7–5.3)
SODIUM: 141 meq/L (ref 137–147)
TCO2: 25 mmol/L (ref 0–100)

## 2013-08-20 LAB — DIFFERENTIAL
Basophils Absolute: 0 10*3/uL (ref 0.0–0.1)
Basophils Relative: 0 % (ref 0–1)
EOS ABS: 0.1 10*3/uL (ref 0.0–0.7)
Eosinophils Relative: 1 % (ref 0–5)
LYMPHS ABS: 1.6 10*3/uL (ref 0.7–4.0)
LYMPHS PCT: 18 % (ref 12–46)
Monocytes Absolute: 0.2 10*3/uL (ref 0.1–1.0)
Monocytes Relative: 2 % — ABNORMAL LOW (ref 3–12)
NEUTROS ABS: 7.1 10*3/uL (ref 1.7–7.7)
NEUTROS PCT: 79 % — AB (ref 43–77)

## 2013-08-20 LAB — PROTIME-INR
INR: 1.07 (ref 0.00–1.49)
Prothrombin Time: 13.9 seconds (ref 11.6–15.2)

## 2013-08-20 LAB — APTT: APTT: 30 s (ref 24–37)

## 2013-08-20 MED ORDER — MECLIZINE HCL 25 MG PO TABS: 25.0000 mg | ORAL_TABLET | Freq: Four times a day (QID) | ORAL | Status: DC | PRN

## 2013-08-20 MED ORDER — POTASSIUM CHLORIDE CRYS ER 20 MEQ PO TBCR
40.0000 meq | EXTENDED_RELEASE_TABLET | Freq: Once | ORAL | Status: AC
  Administered 2013-08-20: 40 meq via ORAL
  Filled 2013-08-20: qty 2

## 2013-08-20 NOTE — ED Provider Notes (Signed)
CSN: [id number]     Arrival date & time 08/20/13  1229 History   First MD Initiated Contact with Patient 08/20/13 1303     Chief Complaint  Patient presents with  . Dizziness    vertigo  . Constipation      Patient is a 68 y.o. male presenting with dizziness. The history is provided by the patient.  Dizziness Quality:  Vertigo Severity:  Mild Onset quality:  Gradual Duration:  1 day Timing:  Intermittent Progression:  Resolved Chronicity:  Recurrent Relieved by: rest. Worsened by:  Movement Associated symptoms: headaches   Associated symptoms: no chest pain, no hearing loss, no nausea, no shortness of breath, no syncope, no tinnitus, no vision changes, no vomiting and no weakness   Associated symptoms comment:  Mild headache  Risk factors: hx of stroke and hx of vertigo   pt reports that he had episode of vertigo yesterday while making a sandwich It is now resolved He has had this before and resolved with meclizine.  He reports he is almost out of this medicine and would like another refill No cp/sob No focal weakness No visual changes No hearing changes No syncope He has mild speech changes from previous stroke that are unchanged    Past Medical History  Diagnosis Date  . Hypertension   . HLD (hyperlipidemia)   . Erectile dysfunction   . Chronic back pain   . CAD (coronary artery disease)   . Carotid artery disease   . PVD (peripheral vascular disease) with claudication   . CVA (cerebrovascular accident) 5/09    Rt sided  . Stroke 01/06/2012  . GERD (gastroesophageal reflux disease)   . Arthritis    Past Surgical History  Procedure Laterality Date  . Eye surgery Left April 2014    Cataract  . Eye surgery Right Jun 16, 2012    Cataract by Dr. Elmer PickerHecker   Family History  Problem Relation Age of Onset  . Lung disease Brother   . Kidney disease Brother   . Heart disease Brother     died at 4158  . Heart disease Mother   . Kidney disease Mother   . Lung  disease Mother    History  Substance Use Topics  . Smoking status: Former Smoker -- 0.50 packs/day for 40 years    Types: Cigarettes    Quit date: 01/29/2004  . Smokeless tobacco: Never Used  . Alcohol Use: No    Review of Systems  HENT: Negative for hearing loss and tinnitus.   Respiratory: Negative for shortness of breath.   Cardiovascular: Negative for chest pain and syncope.  Gastrointestinal: Positive for constipation. Negative for nausea, vomiting and abdominal pain.  Neurological: Positive for dizziness and headaches.  All other systems reviewed and are negative.     Allergies  Novocain  Home Medications   Prior to Admission medications   Medication Sig Start Date End Date Taking? Authorizing Provider  amLODipine (NORVASC) 10 MG tablet Take 1 tablet (10 mg total) by mouth daily. 11/14/12   Stephanie Couphristopher M Street, MD  aspirin 81 MG tablet Take 81 mg by mouth daily.    Historical Provider, MD  atorvastatin (LIPITOR) 40 MG tablet Take 1 tablet (40 mg total) by mouth daily. 03/02/13   Stephanie Couphristopher M Street, MD  finasteride (PROSCAR) 5 MG tablet Take 1 tablet (5 mg total) by mouth daily. 04/26/13   Stephanie Couphristopher M Street, MD  gabapentin (NEURONTIN) 100 MG capsule Take 100 mg by mouth 3 (three)  times daily as needed. Nerve pain.    Historical Provider, MD  gabapentin (NEURONTIN) 100 MG capsule Take 1 capsule (100 mg total) by mouth 3 (three) times daily. 08/02/13   Stephanie Coup Street, MD  meclizine (ANTIVERT) 25 MG tablet Take 1 tablet (25 mg total) by mouth 2 (two) times daily as needed for dizziness. 04/26/13   Stephanie Coup Street, MD  ondansetron (ZOFRAN ODT) 4 MG disintegrating tablet Take 1 tablet (4 mg total) by mouth every 8 (eight) hours as needed for nausea or vomiting. 03/17/13   Clabe Seal, PA-C  sodium chloride (OCEAN) 0.65 % SOLN nasal spray Place 1 spray into both nostrils as needed for congestion. 04/09/13   Josalyn C Funches, MD  traMADol (ULTRAM) 50 MG tablet Take  1-2 tablets (50-100 mg total) by mouth every 8 (eight) hours as needed for moderate pain.    Stephanie Coup Street, MD   BP 139/78  Pulse 73  Temp(Src) 97.8 F (36.6 C) (Oral)  Resp 18  Ht 6\' 4"  (1.93 m)  Wt 216 lb (97.977 kg)  BMI 26.30 kg/m2  SpO2 100% Physical Exam CONSTITUTIONAL: Well developed/well nourished HEAD: Normocephalic/atraumatic EYES: EOMI/PERRL, no nystagmus, no ptosis ENMT: Mucous membranes moist NECK: supple no meningeal signs, no bruits CV: S1/S2 noted, no murmurs/rubs/gallops noted LUNGS: Lungs are clear to auscultation bilaterally, no apparent distress ABDOMEN: soft, nontender, no rebound or guarding GU:no cva tenderness NEURO:Awake/alert, facies symmetric, no arm or leg drift is noted Mild dysarthria noted (baseline) Equal 5/5 strength with shoulder abduction, elbow flex/extension, wrist flex/extension in upper extremities and equal hand grips bilaterally Equal 5/5 strength with hip flexion,knee flex/extension, foot dorsi/plantar flexion Cranial nerves 3/4/5/6/08/05/08/11/12 tested and intact Gait normal without ataxia No past pointing Sensation to light touch intact in all extremities EXTREMITIES: pulses normal, full ROM SKIN: warm, color normal PSYCH: no abnormalities of mood noted  ED Course  Procedures  2:10 PM Pt without any vertigo at this time He feels at baseline He does have h/o CVA with h/o vertebrobasilar stenosis.  However his symptoms have resolved, he is well appearing, and I doubt acute CVA at this time.  He has no focal neuro deficits  Pt requesting d/c home.  I advised f/u with PCP and discussed strict return precautions   Labs Review Labs Reviewed  CBC - Abnormal; Notable for the following:    Hemoglobin 12.2 (*)    HCT 37.4 (*)    Platelets 138 (*)    All other components within normal limits  DIFFERENTIAL - Abnormal; Notable for the following:    Neutrophils Relative % 79 (*)    Monocytes Relative 2 (*)    All other  components within normal limits  COMPREHENSIVE METABOLIC PANEL - Abnormal; Notable for the following:    Potassium 3.3 (*)    Glucose, Bld 162 (*)    GFR calc non Af Amer 71 (*)    GFR calc Af Amer 83 (*)    All other components within normal limits  I-STAT CHEM 8, ED - Abnormal; Notable for the following:    Potassium 3.2 (*)    Glucose, Bld 167 (*)    All other components within normal limits  PROTIME-INR  APTT  I-STAT TROPOININ, ED      EKG Interpretation   Date/Time:  Friday August 20 2013 12:44:02 EDT Ventricular Rate:  78 PR Interval:  144 QRS Duration: 92 QT Interval:  390 QTC Calculation: 444 R Axis:   32 Text Interpretation:  Sinus  rhythm with occasional Premature ventricular  complexes Nonspecific T wave abnormality Abnormal ECG Confirmed by  Bebe Shaggy  MD, Dorinda Hill ([id number]) on 08/20/2013 1:01:27 PM      MDM   Final diagnoses:  Vertigo    Nursing notes including past medical history and social history reviewed and considered in documentation Labs/vital reviewed and considered     Joya Gaskins, MD 08/20/13 1524

## 2013-08-20 NOTE — ED Notes (Signed)
Pt states he has been here before for vertigo. States he fixed him a sandwich yesterday and then felt dizzy like the room was spinning and got sweaty. Has only 1 vertigo tablet left. Small headache 3/10. No N/V/D. States last BM was a week ago.

## 2013-08-20 NOTE — Discharge Instructions (Signed)
Your exam shows you have had an episode of vertigo, which causes a false sense of movement such as a spinning feeling or walls that seem to move.  Most vertigo is caused by a (usually temporary) problem in the inner ear. Rarely, the back part of the brain can cause vertigo (some mini-strokes/strokes), but it appears to be a low risk cause for you at this time. It is important to follow-up with your doctor however, to see if you need further testing.   Do not drive or participate in potentially dangerous activities requiring balance unless off meds (not drowsy) and the vertigo has resolved. Most of the time benign vertigo is much better after a few days. However, mild unsteadiness may last for up to 3 months in some patients. An MRI scan or other special tests to evaluate your hearing and balance may be needed if the vertigo does not improve or returns in the future.   RETURN IMMEDIATELY IF YOU HAVE ANY OF THE FOLLOWING (call 911): Increasing vertigo, earache, ear drainage, or loss of hearing.  Severe headache, blurred or double vision, or trouble walking.  Fainting or poorly responsive, extreme weakness, chest pain, or palpitations.  Fever, persistent vomiting, or dehydration.  Numbness, tingling, incoordination, or weakness of the limbs.  Change in speech, vision, swallowing, understanding, or other concerns.  

## 2013-08-24 ENCOUNTER — Telehealth: Payer: Self-pay | Admitting: *Deleted

## 2013-08-24 NOTE — Telephone Encounter (Signed)
Kathie Rhodes from [employer] called stating that a prior auth was placed for CPT code 48889.  There is one on file already for this code.  Please give her a call back at [phone] ext [phone].  Clovis Pu, RN

## 2013-08-26 ENCOUNTER — Telehealth: Payer: Self-pay | Admitting: Family Medicine

## 2013-08-26 NOTE — Telephone Encounter (Signed)
Spoke with [facility] office had filed a duplicate for CPT code 40347, problem has been resolved.

## 2013-08-26 NOTE — Telephone Encounter (Signed)
Pt called and needs his BP medication sent in. jw

## 2013-08-27 MED ORDER — AMLODIPINE BESYLATE 10 MG PO TABS: 10.0000 mg | ORAL_TABLET | Freq: Every day | ORAL | Status: DC

## 2013-08-27 NOTE — Telephone Encounter (Signed)
Norvasc sent in to [facility] on [address]. Please call pt to let him know. Thanks! --CMS

## 2013-09-14 ENCOUNTER — Encounter: Payer: Self-pay | Admitting: Family Medicine

## 2013-09-14 ENCOUNTER — Ambulatory Visit (INDEPENDENT_AMBULATORY_CARE_PROVIDER_SITE_OTHER): Payer: Commercial Managed Care - HMO | Admitting: Family Medicine

## 2013-09-14 VITALS — BP 134/95 | HR 53 | Temp 97.3°F | Ht 76.0 in | Wt 225.2 lb

## 2013-09-14 DIAGNOSIS — E785 Hyperlipidemia, unspecified: Secondary | ICD-10-CM

## 2013-09-14 DIAGNOSIS — E119 Type 2 diabetes mellitus without complications: Secondary | ICD-10-CM | POA: Insufficient documentation

## 2013-09-14 DIAGNOSIS — R739 Hyperglycemia, unspecified: Secondary | ICD-10-CM

## 2013-09-14 DIAGNOSIS — R7309 Other abnormal glucose: Secondary | ICD-10-CM

## 2013-09-14 LAB — POCT GLYCOSYLATED HEMOGLOBIN (HGB A1C): Hemoglobin A1C: 6.5

## 2013-09-14 LAB — LIPID PANEL
Cholesterol: 150 mg/dL (ref 0–200)
HDL: 46 mg/dL (ref >39)
LDL Cholesterol: 89 mg/dL (ref 0–99)
TRIGLYCERIDES: 74 mg/dL (ref <150)
Total CHOL/HDL Ratio: 3.3 Ratio
VLDL: 15 mg/dL (ref 0–40)

## 2013-09-14 MED ORDER — METFORMIN HCL 500 MG PO TABS: 500.0000 mg | ORAL_TABLET | Freq: Every day | ORAL | Status: DC

## 2013-09-14 NOTE — Assessment & Plan Note (Addendum)
Worried about elevated blood sugars. He appears to be asymptomatic at this point. Upon chart review his hemoglobin A1c previously was 6.5 and 2013. He is not on any medications. - Hemoglobin A1c is 6.5. Unchanged from previous - Will start metformin 500 mg once daily - Follow up with PCP in 3 months.

## 2013-09-14 NOTE — Patient Instructions (Addendum)
Thank you for coming in,   I will call you with the results from today. Based on your results from today will determine our treatment plan.   Please follow up with Dr. Casper Harrison in three months.    Please feel free to call with any questions or concerns at any time, at [phone]. --Dr. Jordan Likes  Diet Recommendations for Diabetes   Starchy (carb) foods include: Bread, rice, pasta, potatoes, corn, crackers, bagels, muffins, all baked goods.   Protein foods include: Meat, fish, poultry, eggs, dairy foods, and beans such as pinto and kidney beans (beans also provide carbohydrate).   1. Eat at least 3 meals and 1-2 snacks per day. Never go more than 4-5 hours while awake without eating.  2. Limit starchy foods to TWO per meal and ONE per snack. ONE portion of a starchy  food is equal to the following:   - ONE slice of bread (or its equivalent, such as half of a hamburger bun).   - 1/2 cup of a "scoopable" starchy food such as potatoes or rice.   - 1 OUNCE (28 grams) of starchy snack foods such as crackers or pretzels (look on label).   - 15 grams of carbohydrate as shown on food label.  3. Both lunch and dinner should include a protein food, a carb food, and vegetables.   - Obtain twice as many veg's as protein or carbohydrate foods for both lunch and dinner.   - Try to keep frozen veg's on hand for a quick vegetable serving.     - Fresh or frozen veg's are best.  4. Breakfast should always include protein.

## 2013-09-14 NOTE — Progress Notes (Signed)
   Subjective:    Patient ID: Brian Burns, male    DOB: 1945/09/13, 68 y.o.   MRN: [id number]  HPI Brian Burns is here for high blood sugar.  Patient with no history of diabetes type 2 on his problem list. Upon review of his chart he has had different episodes of hyperglycemia.  Thinks he has been having high blood sugar.  He has been vertigo for a second time in one week.  Denies polyuria or polydipsia. Does report any paresthesia in his fingers or toes. He denies any relation with diabetes.   She requesting refills on his statin. Had a has not had any lipid profile done since 2013. Reports compliance with no problems.  Current Outpatient Prescriptions on File Prior to Visit  Medication Sig Dispense Refill  . amLODipine (NORVASC) 10 MG tablet Take 1 tablet (10 mg total) by mouth daily.  90 tablet  1  . aspirin 81 MG tablet Take 81 mg by mouth daily.      Marland Kitchen atorvastatin (LIPITOR) 40 MG tablet Take 1 tablet (40 mg total) by mouth daily.  90 tablet  1  . finasteride (PROSCAR) 5 MG tablet Take 1 tablet (5 mg total) by mouth daily.  30 tablet  1  . gabapentin (NEURONTIN) 100 MG capsule Take 100 mg by mouth 3 (three) times daily as needed. Nerve pain.      Marland Kitchen gabapentin (NEURONTIN) 100 MG capsule Take 1 capsule (100 mg total) by mouth 3 (three) times daily.  180 capsule  2  . meclizine (ANTIVERT) 25 MG tablet Take 1 tablet (25 mg total) by mouth 2 (two) times daily as needed for dizziness.  30 tablet  2  . meclizine (ANTIVERT) 25 MG tablet Take 1 tablet (25 mg total) by mouth every 6 (six) hours as needed for dizziness.  20 tablet  0  . ondansetron (ZOFRAN ODT) 4 MG disintegrating tablet Take 1 tablet (4 mg total) by mouth every 8 (eight) hours as needed for nausea or vomiting.  20 tablet  0  . sodium chloride (OCEAN) 0.65 % SOLN nasal spray Place 1 spray into both nostrils as needed for congestion.  60 mL  0  . traMADol (ULTRAM) 50 MG tablet Take 1-2 tablets (50-100 mg total) by mouth every  8 (eight) hours as needed for moderate pain.  50 tablet  1   No current facility-administered medications on file prior to visit.    Review of Systems See HPI     Objective:   Physical Exam BP 134/95  Pulse 53  Temp(Src) 97.3 F (36.3 C) (Oral)  Ht 6\' 4"  (1.93 m)  Wt 225 lb 3.2 oz (102.15 kg)  BMI 27.42 kg/m2 Gen: NAD, alert, cooperative with exam, well-appearing HEENT: NCAT CV: RRR, good S1/S2, no murmur, no edema, capillary refill brisk  Resp: CTABL, no wheezes, non-labored MSK: Normal sensation in fingers and toes bilaterally, +1 pulses and feet bilaterally       Assessment & Plan:

## 2013-09-14 NOTE — Assessment & Plan Note (Signed)
Questing refills of his medication. Works compliance with no side effects. - Lipid panel today. -Therapy guided by results of labs.

## 2013-09-17 ENCOUNTER — Other Ambulatory Visit: Payer: Self-pay | Admitting: *Deleted

## 2013-09-17 MED ORDER — ATORVASTATIN CALCIUM 40 MG PO TABS: 40.0000 mg | ORAL_TABLET | Freq: Every day | ORAL | Status: DC

## 2013-10-02 ENCOUNTER — Encounter (HOSPITAL_COMMUNITY): Payer: Self-pay | Admitting: Emergency Medicine

## 2013-10-02 ENCOUNTER — Emergency Department (HOSPITAL_COMMUNITY)
Admission: EM | Admit: 2013-10-02 | Discharge: 2013-10-02 | Disposition: A | Discharge status: Home / Self Care | Payer: Medicare HMO | Attending: Emergency Medicine | Admitting: Emergency Medicine

## 2013-10-02 ENCOUNTER — Emergency Department (HOSPITAL_COMMUNITY): Payer: Medicare HMO

## 2013-10-02 DIAGNOSIS — Z8673 Personal history of transient ischemic attack (TIA), and cerebral infarction without residual deficits: Secondary | ICD-10-CM | POA: Insufficient documentation

## 2013-10-02 DIAGNOSIS — Z87448 Personal history of other diseases of urinary system: Secondary | ICD-10-CM | POA: Insufficient documentation

## 2013-10-02 DIAGNOSIS — G8929 Other chronic pain: Secondary | ICD-10-CM | POA: Insufficient documentation

## 2013-10-02 DIAGNOSIS — R000 Tachycardia, unspecified: Secondary | ICD-10-CM | POA: Insufficient documentation

## 2013-10-02 DIAGNOSIS — Z87891 Personal history of nicotine dependence: Secondary | ICD-10-CM | POA: Insufficient documentation

## 2013-10-02 DIAGNOSIS — J159 Unspecified bacterial pneumonia: Secondary | ICD-10-CM | POA: Diagnosis not present

## 2013-10-02 DIAGNOSIS — I2510 Atherosclerotic heart disease of native coronary artery without angina pectoris: Secondary | ICD-10-CM | POA: Diagnosis not present

## 2013-10-02 DIAGNOSIS — E785 Hyperlipidemia, unspecified: Secondary | ICD-10-CM | POA: Insufficient documentation

## 2013-10-02 DIAGNOSIS — Z79899 Other long term (current) drug therapy: Secondary | ICD-10-CM | POA: Diagnosis not present

## 2013-10-02 DIAGNOSIS — R0682 Tachypnea, not elsewhere classified: Secondary | ICD-10-CM | POA: Insufficient documentation

## 2013-10-02 DIAGNOSIS — J189 Pneumonia, unspecified organism: Secondary | ICD-10-CM

## 2013-10-02 DIAGNOSIS — M129 Arthropathy, unspecified: Secondary | ICD-10-CM | POA: Insufficient documentation

## 2013-10-02 DIAGNOSIS — I10 Essential (primary) hypertension: Secondary | ICD-10-CM | POA: Diagnosis not present

## 2013-10-02 DIAGNOSIS — R112 Nausea with vomiting, unspecified: Secondary | ICD-10-CM | POA: Diagnosis present

## 2013-10-02 LAB — CBC WITH DIFFERENTIAL/PLATELET
BASOS ABS: 0 10*3/uL (ref 0.0–0.1)
BASOS PCT: 0 % (ref 0–1)
Eosinophils Absolute: 0.1 10*3/uL (ref 0.0–0.7)
Eosinophils Relative: 0 % (ref 0–5)
HCT: 39 % (ref 39.0–52.0)
Hemoglobin: 12.9 g/dL — ABNORMAL LOW (ref 13.0–17.0)
Lymphocytes Relative: 20 % (ref 12–46)
Lymphs Abs: 2.2 10*3/uL (ref 0.7–4.0)
MCH: 27.8 pg (ref 26.0–34.0)
MCHC: 33.1 g/dL (ref 30.0–36.0)
MCV: 84.1 fL (ref 78.0–100.0)
Monocytes Absolute: 0.2 10*3/uL (ref 0.1–1.0)
Monocytes Relative: 2 % — ABNORMAL LOW (ref 3–12)
NEUTROS PCT: 78 % — AB (ref 43–77)
Neutro Abs: 8.8 10*3/uL — ABNORMAL HIGH (ref 1.7–7.7)
Platelets: 160 10*3/uL (ref 150–400)
RBC: 4.64 MIL/uL (ref 4.22–5.81)
RDW: 14.9 % (ref 11.5–15.5)
WBC: 11.4 10*3/uL — ABNORMAL HIGH (ref 4.0–10.5)

## 2013-10-02 LAB — COMPREHENSIVE METABOLIC PANEL
ALBUMIN: 3.9 g/dL (ref 3.5–5.2)
ALK PHOS: 112 U/L (ref 39–117)
ALT: 20 U/L (ref 0–53)
AST: 25 U/L (ref 0–37)
Anion gap: 17 — ABNORMAL HIGH (ref 5–15)
BUN: 12 mg/dL (ref 6–23)
CO2: 21 mEq/L (ref 19–32)
Calcium: 8.9 mg/dL (ref 8.4–10.5)
Chloride: 101 mEq/L (ref 96–112)
Creatinine, Ser: 1.14 mg/dL (ref 0.50–1.35)
GFR calc Af Amer: 76 mL/min — ABNORMAL LOW (ref >90)
GFR calc non Af Amer: 65 mL/min — ABNORMAL LOW (ref >90)
Glucose, Bld: 113 mg/dL — ABNORMAL HIGH (ref 70–99)
POTASSIUM: 4 meq/L (ref 3.7–5.3)
SODIUM: 139 meq/L (ref 137–147)
TOTAL PROTEIN: 7.6 g/dL (ref 6.0–8.3)
Total Bilirubin: 0.6 mg/dL (ref 0.3–1.2)

## 2013-10-02 LAB — URINALYSIS, ROUTINE W REFLEX MICROSCOPIC
BILIRUBIN URINE: NEGATIVE
Glucose, UA: NEGATIVE mg/dL
Hgb urine dipstick: NEGATIVE
Ketones, ur: NEGATIVE mg/dL
Leukocytes, UA: NEGATIVE
NITRITE: NEGATIVE
PH: 6 (ref 5.0–8.0)
Protein, ur: NEGATIVE mg/dL
SPECIFIC GRAVITY, URINE: 1.013 (ref 1.005–1.030)
Urobilinogen, UA: 1 mg/dL (ref 0.0–1.0)

## 2013-10-02 LAB — LIPASE, BLOOD: Lipase: 55 U/L (ref 11–59)

## 2013-10-02 LAB — TROPONIN I

## 2013-10-02 MED ORDER — ACETAMINOPHEN 325 MG PO TABS
650.0000 mg | ORAL_TABLET | Freq: Once | ORAL | Status: AC
  Administered 2013-10-02: 650 mg via ORAL
  Filled 2013-10-02: qty 2

## 2013-10-02 MED ORDER — ONDANSETRON HCL 4 MG/2ML IJ SOLN
4.0000 mg | Freq: Once | INTRAMUSCULAR | Status: AC
  Administered 2013-10-02: 4 mg via INTRAVENOUS
  Filled 2013-10-02: qty 2

## 2013-10-02 MED ORDER — DOXYCYCLINE HYCLATE 100 MG PO CAPS: 100.0000 mg | ORAL_CAPSULE | Freq: Two times a day (BID) | ORAL | Status: DC

## 2013-10-02 MED ORDER — SODIUM CHLORIDE 0.9 % IV BOLUS (SEPSIS)
1000.0000 mL | INTRAVENOUS | Status: AC
  Administered 2013-10-02: 1000 mL via INTRAVENOUS

## 2013-10-02 MED ORDER — ONDANSETRON 4 MG PO TBDP: ORAL_TABLET | ORAL | Status: DC

## 2013-10-02 MED ORDER — DOXYCYCLINE HYCLATE 100 MG PO TABS
100.0000 mg | ORAL_TABLET | Freq: Once | ORAL | Status: AC
  Administered 2013-10-02: 100 mg via ORAL
  Filled 2013-10-02: qty 1

## 2013-10-02 NOTE — ED Notes (Signed)
Brought pt back to room via wheelchair with family in tow; pt undressed, in gown, on monitor, continuous pulse oximetry and blood pressure cuff; Donnamae Jude, RN and Wes, EMT present in room

## 2013-10-02 NOTE — ED Provider Notes (Signed)
CSN: [id number]     Arrival date & time 10/02/13  1001 History   First MD Initiated Contact with Patient 10/02/13 1038     Chief Complaint  Patient presents with  . Emesis     (Consider location/radiation/quality/duration/timing/severity/associated sxs/prior Treatment) Patient is a 68 y.o. male presenting with vomiting. The history is provided by the patient.  Emesis Severity:  Mild Duration:  1 hour Timing: once. Quality:  Stomach contents Progression:  Unable to specify Chronicity:  New Relieved by:  Nothing Worsened by:  Nothing tried Ineffective treatments:  None tried Associated symptoms: chills and myalgias   Associated symptoms: no abdominal pain, no diarrhea and no headaches   Myalgias:    Location:  Generalized   Quality:  Aching   Severity:  Moderate   Onset quality:  Sudden   Duration:  6 hours   Timing:  Constant   Progression:  Unchanged   Past Medical History  Diagnosis Date  . Hypertension   . HLD (hyperlipidemia)   . Erectile dysfunction   . Chronic back pain   . CAD (coronary artery disease)   . Carotid artery disease   . PVD (peripheral vascular disease) with claudication   . CVA (cerebrovascular accident) 5/09    Rt sided  . Stroke 01/06/2012  . GERD (gastroesophageal reflux disease)   . Arthritis    Past Surgical History  Procedure Laterality Date  . Eye surgery Left April 2014    Cataract  . Eye surgery Right Jun 16, 2012    Cataract by Dr. Elmer Picker   Family History  Problem Relation Age of Onset  . Lung disease Brother   . Kidney disease Brother   . Heart disease Brother     died at 32  . Heart disease Mother   . Kidney disease Mother   . Lung disease Mother    History  Substance Use Topics  . Smoking status: Former Smoker -- 0.50 packs/day for 40 years    Types: Cigarettes    Quit date: 01/29/2004  . Smokeless tobacco: Never Used  . Alcohol Use: No    Review of Systems  Constitutional: Positive for fever (subjective) and  chills.  HENT: Negative for drooling and rhinorrhea.   Eyes: Negative for pain.  Respiratory: Positive for shortness of breath. Negative for cough.   Cardiovascular: Negative for chest pain and leg swelling.  Gastrointestinal: Positive for nausea and vomiting. Negative for abdominal pain and diarrhea.  Genitourinary: Negative for dysuria and hematuria.  Musculoskeletal: Positive for myalgias. Negative for gait problem and neck pain.  Skin: Negative for color change.  Neurological: Negative for numbness and headaches.  Hematological: Negative for adenopathy.  Psychiatric/Behavioral: Negative for behavioral problems.  All other systems reviewed and are negative.     Allergies  Novocain  Home Medications   Prior to Admission medications   Medication Sig Start Date End Date Taking? Authorizing Provider  amLODipine (NORVASC) 10 MG tablet Take 1 tablet (10 mg total) by mouth daily. 08/27/13   Stephanie Coup Street, MD  aspirin 81 MG tablet Take 81 mg by mouth daily.    Historical Provider, MD  atorvastatin (LIPITOR) 40 MG tablet Take 1 tablet (40 mg total) by mouth daily. 09/17/13   Stephanie Coup Street, MD  finasteride (PROSCAR) 5 MG tablet Take 1 tablet (5 mg total) by mouth daily. 04/26/13   Stephanie Coup Street, MD  gabapentin (NEURONTIN) 100 MG capsule Take 100 mg by mouth 3 (three) times daily as needed. Nerve  pain.    Historical Provider, MD  gabapentin (NEURONTIN) 100 MG capsule Take 1 capsule (100 mg total) by mouth 3 (three) times daily. 08/02/13   Stephanie Coup Street, MD  HYDROcodone-acetaminophen (NORCO/VICODIN) 5-325 MG per tablet  09/23/13   Historical Provider, MD  meclizine (ANTIVERT) 25 MG tablet Take 1 tablet (25 mg total) by mouth 2 (two) times daily as needed for dizziness. 04/26/13   Stephanie Coup Street, MD  meclizine (ANTIVERT) 25 MG tablet Take 1 tablet (25 mg total) by mouth every 6 (six) hours as needed for dizziness. 08/20/13   Joya Gaskins, MD  metFORMIN  (GLUCOPHAGE) 500 MG tablet Take 1 tablet (500 mg total) by mouth daily. 09/14/13   Myra Rude, MD  ondansetron (ZOFRAN ODT) 4 MG disintegrating tablet Take 1 tablet (4 mg total) by mouth every 8 (eight) hours as needed for nausea or vomiting. 03/17/13   Mellody Drown, PA-C  sodium chloride (OCEAN) 0.65 % SOLN nasal spray Place 1 spray into both nostrils as needed for congestion. 04/09/13   Josalyn C Funches, MD  traMADol (ULTRAM) 50 MG tablet Take 1-2 tablets (50-100 mg total) by mouth every 8 (eight) hours as needed for moderate pain.    Stephanie Coup Street, MD   BP 144/101  Pulse 96  Temp(Src) 99.4 F (37.4 C) (Oral)  Resp 22  SpO2 100% Physical Exam  Nursing note and vitals reviewed. Constitutional: He is oriented to person, place, and time. He appears well-developed and well-nourished.  HENT:  Head: Normocephalic and atraumatic.  Right Ear: External ear normal.  Left Ear: External ear normal.  Nose: Nose normal.  Mouth/Throat: Oropharynx is clear and moist. No oropharyngeal exudate.  Eyes: Conjunctivae and EOM are normal. Pupils are equal, round, and reactive to light.  Neck: Normal range of motion. Neck supple.  Cardiovascular: Regular rhythm, normal heart sounds and intact distal pulses.  Exam reveals no gallop and no friction rub.   No murmur heard. Tachycardic. Heart rate 128.  Pulmonary/Chest: Breath sounds normal. He has no wheezes.  Tachypneic.  Abdominal: Soft. Bowel sounds are normal. He exhibits no distension. There is no tenderness. There is no rebound and no guarding.  No abdominal pain noted with deep palpation of the abdomen.  Musculoskeletal: Normal range of motion. He exhibits no edema and no tenderness.  No focal back tenderness.  Neurological: He is alert and oriented to person, place, and time.  Skin: Skin is warm and dry.  Psychiatric: He has a normal mood and affect. His behavior is normal.    ED Course  Procedures (including critical care time) Labs  Review Labs Reviewed  CBC WITH DIFFERENTIAL - Abnormal; Notable for the following:    WBC 11.4 (*)    Hemoglobin 12.9 (*)    Neutrophils Relative % 78 (*)    Neutro Abs 8.8 (*)    Monocytes Relative 2 (*)    All other components within normal limits  COMPREHENSIVE METABOLIC PANEL - Abnormal; Notable for the following:    Glucose, Bld 113 (*)    GFR calc non Af Amer 65 (*)    GFR calc Af Amer 76 (*)    Anion gap 17 (*)    All other components within normal limits  LIPASE, BLOOD  TROPONIN I  URINALYSIS, ROUTINE W REFLEX MICROSCOPIC    Imaging Review Dg Chest 2 View  10/02/2013   CLINICAL DATA:  Shortness of breath, cough  EXAM: CHEST  2 VIEW  COMPARISON:  03/17/2013  FINDINGS: There is new patchy opacity of the right middle lobe and right lower lobe with internal air bronchogram formation. Left lung is clear. Central bronchial wall thickening is noted on the right. Heart size is normal. The aorta is unfolded and ectatic. No pleural effusion.  IMPRESSION: Patchy right lower lobe and middle lobe airspace disease most compatible with pneumonia.   Electronically Signed   By: Christiana Pellant M.D.   On: 10/02/2013 12:28   Dg Abd 1 View  10/02/2013   CLINICAL DATA:  Shortness of breath, cough, fever, emesis, history of hypertension; bladder issues, recent urinary accident  EXAM: ABDOMEN - 1 VIEW  COMPARISON:  CT abdomen pelvis - 09/02/2012  FINDINGS: Moderate to large colonic stool burden without evidence of obstruction.  Nondiagnostic evaluation for pneumoperitoneum secondary supine positioning and exclusion a lower thorax. No definite pneumatosis or portal venous gas.  Several vascular calcifications and phleboliths overlie the lower pelvis bilaterally. Otherwise, no definite abnormal intra-abdominal calcifications.  No acute osseus abnormalities.  IMPRESSION: 1. Moderate to large colonic stool burden without evidence of obstruction. 2. No definite evidence of nephrolithiasis.   Electronically Signed    By: Simonne Come M.D.   On: 10/02/2013 12:29     EKG Interpretation   Date/Time:  Saturday October 02 2013 11:00:44 EDT Ventricular Rate:  128 PR Interval:  146 QRS Duration: 79 QT Interval:  330 QTC Calculation: 481 R Axis:   80 Text Interpretation:  Sinus tachycardia Probable left atrial enlargement  Nonspecific repol abnormality, diffuse leads Artifact in lead(s) I II III  aVR aVL aVF V1 V2 V3 and baseline wander in lead(s) V4 no other  significant changes noted Confirmed by Toryn Dewalt  MD, Nalla Purdy ([id number]) on  10/02/2013 11:03:02 AM      MDM   Final diagnoses:  Community acquired pneumonia    11:01 AM 68 y.o. male w hx of HTN, chronic back pain s/p injection by specialist (per pt) 1 week ago, CAD, CVA who presents with chills, body aches, shortness of breath, low back pain which began this morning. He states that he had one episode of emesis here. He also states that he had approximately 3 minutes of sharp periumbilical abdominal pain which resolved. His abdomen is currently soft and completely benign without any tenderness. He is afebrile on oral temperature but will check rectal temperature. He is tachycardic and tachypneic on exam. He denies any chest pain. He denies any cough. Will get screening labwork, imaging. Will give IV fluids and a Tylenol.  1:19 PM: Pt's HR and RR have dec w/ IVF and tylenol. Pt cont to appear well. I offered admission, but he declines. I think it is reasonable to treat him as an outpt per Curb-65/PORT score. I have discussed the diagnosis/risks/treatment options with the patient and believe the pt to be eligible for discharge home to follow-up with his pcp next week. We also discussed returning to the ED immediately if new or worsening sx occur. We discussed the sx which are most concerning (e.g., worsening sob, cp, intractable fever, inability to take abx) that necessitate immediate return. Medications administered to the patient during their visit and any  new prescriptions provided to the patient are listed below.  Medications given during this visit Medications  doxycycline (VIBRA-TABS) tablet 100 mg (not administered)  sodium chloride 0.9 % bolus 1,000 mL (1,000 mLs Intravenous New Bag/Given 10/02/13 1125)  acetaminophen (TYLENOL) tablet 650 mg (650 mg Oral Given 10/02/13 1123)  ondansetron (ZOFRAN) injection 4 mg (4  mg Intravenous Given 10/02/13 1126)    New Prescriptions   DOXYCYCLINE (VIBRAMYCIN) 100 MG CAPSULE    Take 1 capsule (100 mg total) by mouth 2 (two) times daily. One po bid x 7 days   ONDANSETRON (ZOFRAN ODT) 4 MG DISINTEGRATING TABLET     ODT q4 hours prn nausea/vomit       Purvis Sheffield, MD 10/02/13 1320

## 2013-10-02 NOTE — ED Notes (Addendum)
He woke this am with nausea and vomiting and cold chills. He also c/o abd pain. Reports constipation x 1 week

## 2013-10-08 ENCOUNTER — Encounter: Payer: Self-pay | Admitting: Family Medicine

## 2013-10-08 ENCOUNTER — Ambulatory Visit (INDEPENDENT_AMBULATORY_CARE_PROVIDER_SITE_OTHER): Payer: Commercial Managed Care - HMO | Admitting: Family Medicine

## 2013-10-08 VITALS — BP 137/84 | HR 58 | Temp 97.7°F | Ht 76.0 in | Wt 218.3 lb

## 2013-10-08 DIAGNOSIS — K5900 Constipation, unspecified: Secondary | ICD-10-CM

## 2013-10-08 DIAGNOSIS — J189 Pneumonia, unspecified organism: Secondary | ICD-10-CM

## 2013-10-08 DIAGNOSIS — J181 Lobar pneumonia, unspecified organism: Principal | ICD-10-CM

## 2013-10-08 MED ORDER — DOCUSATE SODIUM 250 MG PO CAPS: 250.0000 mg | ORAL_CAPSULE | Freq: Every day | ORAL | Status: DC

## 2013-10-08 NOTE — Patient Instructions (Addendum)
It was a pleasure to see you today for follow up for your recent pneumonia diagnosis.  I am glad you are feeling better.   Please finish the doxycycline antibiotic (100mg  capsules, 1 capsule by mouth twice daily)'; your last day of medicine is tomorrow.   As we discussed, you are encouraged to contact one of the Gastroenterology offices for a screening colonoscopy, as a recommended test for people in your age group to look for early colon cancer.   I ask that you follow up with Dr Casper Harrison regarding your persistent back pain.   FOLLOW UP WITH DR STREET IN THE COMING MONTH FOR FOLLOW-UP ON BACK PAIN.

## 2013-10-11 DIAGNOSIS — K5900 Constipation, unspecified: Secondary | ICD-10-CM | POA: Insufficient documentation

## 2013-10-11 DIAGNOSIS — J181 Lobar pneumonia, unspecified organism: Principal | ICD-10-CM

## 2013-10-11 DIAGNOSIS — J189 Pneumonia, unspecified organism: Secondary | ICD-10-CM | POA: Insufficient documentation

## 2013-10-11 NOTE — Progress Notes (Signed)
   Subjective:    Patient ID: Brian Burns, male    DOB: [birth year], 68 y.o.   MRN: [id number]  HPI Patient presents for follow up from ED, where he was seen on 10/02/2013 and diagnosed with RLL vs RML PNA.  He had initially presented with recurrent emesis, did not have cough or dyspnea upon initial presentation.  He has not had development of respiratory symptoms, has been taking the doxycycline 100mg  twice daily as instructed, is scheduled to finish his 10-day course early next week as anticipated.  He denies development of fevers or chills. The nausea and vomiting have resolved.   Reports that he has struggled with some difficulty passing stool for a long time (on the order of years), without change.  He has taken Miralax without satisfactory relief. No blood in stool, no recent changes in bowel habits.   Review of Systems     Objective:   Physical Exam Well appearing, no apparent distress HEENT Neck supple, no cervical adenopathy. Moist mucus membranes.  COR Regular S1S2 PULM Clear bilaterally, no rales or wheezes ABD Soft, nontender, nondistended. No masses detected on palpation of abdomen.        Assessment & Plan:

## 2013-10-21 ENCOUNTER — Encounter: Payer: Self-pay | Admitting: Family Medicine

## 2013-10-21 ENCOUNTER — Ambulatory Visit (INDEPENDENT_AMBULATORY_CARE_PROVIDER_SITE_OTHER): Payer: Commercial Managed Care - HMO | Admitting: Family Medicine

## 2013-10-21 VITALS — BP 151/88 | HR 72 | Temp 98.1°F | Wt 220.0 lb

## 2013-10-21 DIAGNOSIS — M25562 Pain in left knee: Secondary | ICD-10-CM

## 2013-10-21 DIAGNOSIS — K5900 Constipation, unspecified: Secondary | ICD-10-CM

## 2013-10-21 DIAGNOSIS — Z23 Encounter for immunization: Secondary | ICD-10-CM

## 2013-10-21 DIAGNOSIS — R0989 Other specified symptoms and signs involving the circulatory and respiratory systems: Secondary | ICD-10-CM

## 2013-10-21 DIAGNOSIS — M25569 Pain in unspecified knee: Secondary | ICD-10-CM

## 2013-10-21 DIAGNOSIS — K219 Gastro-esophageal reflux disease without esophagitis: Secondary | ICD-10-CM | POA: Insufficient documentation

## 2013-10-21 DIAGNOSIS — M25561 Pain in right knee: Secondary | ICD-10-CM

## 2013-10-21 MED ORDER — OMEPRAZOLE 20 MG PO CPDR: 20.0000 mg | DELAYED_RELEASE_CAPSULE | Freq: Every day | ORAL | Status: DC

## 2013-10-21 MED ORDER — TRAMADOL HCL 50 MG PO TABS: 50.0000 mg | ORAL_TABLET | Freq: Three times a day (TID) | ORAL | Status: DC | PRN

## 2013-10-21 NOTE — Assessment & Plan Note (Signed)
Classic heartburn-type symptoms described, without significant pain on exam and no history of coffee-ground emesis or frank bloody emesis. Continue OTC antacids PRN. Started omeprazole, today; plan for a few months course, then taper off if able. F/u as needed.

## 2013-10-21 NOTE — Progress Notes (Signed)
   Subjective:    Patient ID: Brian Burns, male    DOB: July 28, 1945, 68 y.o.   MRN: [id number]  HPI: Pt presents to clinic for chest congestion / heartburn and constipation.  Chest congestion - pt recently treated for CAP. States he finished his antibiotic (doxycycline) recently. - complains of chest congestion for the past few days but denies fever or SOB - denies productive sputum and feels that things are not coming up like they should - denies chest pain but does have heartburn - heartburn present for "a long time," two months or so, that comes and goes, relieved with burping - describes a burning in the middle of his chest, helped with Rolaids OTC  Constipation - present for at least the past 3 weeks; has had constipation for years - tried MiraLAX and Colace without much help; has not tried magnesium citrate, enemas or suppositories - denies frank abdominal pain - denies blood in stool, pain with defecation - has not contacted a GI doctor for screening colonoscopy; states he had one around 2004 or so  Of note, pt requests refill of tramadol for arthritis. His back pain is doing okay. He is seeing [facility] and had a shot in his back, which helped greatly. He has been using tramadol less since that time. He also requests a flu shot.  Review of Systems: As above.     Objective:   Physical Exam BP 151/88  Pulse 72  Temp(Src) 98.1 F (36.7 C) (Oral)  Wt 220 lb (99.791 kg)  SpO2 100% Gen: well-appearing adult male, in NAD HEENT: [place]/AT, EOMI, PERRLA, MMM Cardio: RRR, no murmur appreciated Pulm: CTAB, no wheezes, normal WOB, pt speaking in full sentences  Respirations slightly shallow but no coarse breath sounds and breathing not labored Abd: soft, nontender, slightly full to palpation but non-peritoneal, BS+ Ext: warm, well-perfused, no LE edema     Assessment & Plan:  See problem list notes.

## 2013-10-21 NOTE — Patient Instructions (Signed)
Thank you for coming in, today!  For your congestion: Get something over the counter that has guaifenesin in it. These are medicines like Mucinex or Robitussin. The pharmacist can help you.  For your heartburn: Start taking a medicine called omeprazole, 20 mg a day, once a day. This reduces the amount of acid in your stomach. You can keep taking Rolaids as you need.  For your constipation: You can keep taking MiraLAX and / or Colace. Try using a medicine called magnesium citrate. The pharmacist can help you find it. It will probably make you have bowel movements for a good day or two. Make sure you drink plenty of water because you can get very dehydrate. If that doesn't work, you can repeat it, or you can try over-the-counter enemas.  Come back to see me in about two months and we'll repeat some blood work. This will be for follow up of your blood sugars. Make sure you talk to the GI doctor about getting a colonoscopy, too.  Please feel free to call with any questions or concerns at any time, at [phone]. --Dr. Casper Harrison

## 2013-10-21 NOTE — Assessment & Plan Note (Signed)
A: Long-standing complaint, worse for the past few weeks, with little help with Colace and MiraLAX. Pt has had colonoscopy in the past, but it has been several years (he thinks it was "across the street," which could possibly be [facility] -- he is not sure which doctor it was). No incontinence, change in sensation, pain with defecation, blood in stool.  P: Advised increased water and fiber intake. Recommended OTC products such as mag citrate and/or enemas; advised good hydration ESPECIALLY if he tries mag citrate. Continue MiraLAX / Colace, as well, if needed. Provided information about f/u with GI for screening colonoscopy. Follow up PRN.

## 2013-10-21 NOTE — Assessment & Plan Note (Signed)
Likely osteoarthritis, doing well, following with ortho for this and back pain. Refilled tramadol. F/u PRN.

## 2013-10-21 NOTE — Assessment & Plan Note (Signed)
Likely related to recent CAP, s/p treatment with appropriate antibiotics. No fever, increased WOB, or unusual findings on exam, today. Advised OTC decongestants and f/u as needed.

## 2013-12-02 ENCOUNTER — Ambulatory Visit (INDEPENDENT_AMBULATORY_CARE_PROVIDER_SITE_OTHER): Payer: Commercial Managed Care - HMO | Admitting: Family Medicine

## 2013-12-02 ENCOUNTER — Encounter: Payer: Self-pay | Admitting: Family Medicine

## 2013-12-02 VITALS — BP 154/94 | HR 60 | Ht 76.0 in | Wt 217.0 lb

## 2013-12-02 DIAGNOSIS — L84 Corns and callosities: Secondary | ICD-10-CM | POA: Insufficient documentation

## 2013-12-02 DIAGNOSIS — M545 Low back pain, unspecified: Secondary | ICD-10-CM

## 2013-12-02 DIAGNOSIS — I10 Essential (primary) hypertension: Secondary | ICD-10-CM

## 2013-12-02 DIAGNOSIS — E1142 Type 2 diabetes mellitus with diabetic polyneuropathy: Secondary | ICD-10-CM

## 2013-12-02 DIAGNOSIS — E785 Hyperlipidemia, unspecified: Secondary | ICD-10-CM

## 2013-12-02 LAB — COMPREHENSIVE METABOLIC PANEL
ALK PHOS: 106 U/L (ref 39–117)
ALT: 13 U/L (ref 0–53)
AST: 18 U/L (ref 0–37)
Albumin: 4 g/dL (ref 3.5–5.2)
BILIRUBIN TOTAL: 0.6 mg/dL (ref 0.2–1.2)
BUN: 14 mg/dL (ref 6–23)
CO2: 28 mEq/L (ref 19–32)
CREATININE: 1.21 mg/dL (ref 0.50–1.35)
Calcium: 8.7 mg/dL (ref 8.4–10.5)
Chloride: 100 mEq/L (ref 96–112)
Glucose, Bld: 87 mg/dL (ref 70–99)
Potassium: 3.6 mEq/L (ref 3.5–5.3)
Sodium: 135 mEq/L (ref 135–145)
Total Protein: 7 g/dL (ref 6.0–8.3)

## 2013-12-02 LAB — POCT GLYCOSYLATED HEMOGLOBIN (HGB A1C): HEMOGLOBIN A1C: 6.7

## 2013-12-02 MED ORDER — TRAMADOL HCL 50 MG PO TABS: 50.0000 mg | ORAL_TABLET | Freq: Three times a day (TID) | ORAL | Status: DC | PRN

## 2013-12-02 MED ORDER — AMLODIPINE BESYLATE 10 MG PO TABS: 10.0000 mg | ORAL_TABLET | Freq: Every day | ORAL | Status: DC

## 2013-12-02 NOTE — Assessment & Plan Note (Signed)
A: Reports compliance with Lipitor. No new body / muscle aches.  P: Continue statin. Lipid panel checked recently; repeat in 6-12 months. Check CMP today for liver function.

## 2013-12-02 NOTE — Assessment & Plan Note (Signed)
A: Elevated today, likely secondary to pain. Reports compliance with medications, needs refill of Norvasc.  P: Refilled Norvasc. Checking CMP today for kidney function. F/u in about 3 months; consider ACE/ARB if elevation continues.

## 2013-12-02 NOTE — Progress Notes (Signed)
   Subjective:    Patient ID: Brian Burns, male    DOB: 24-Jun-1945, 68 y.o.   MRN: [id number]  HPI: Pt presents to clinic for f/u of DM, HTN, and HLD.   HTN: pt reports compliance with meds, denies LE swelling, chest pain, headache - denies SOB at rest; does have some SOB with ambulating long distances  HLD - reports compliance with statin without new side effects or issues - denies new / different muscle aches / pains  DM - pt reports compliance with metformin, does not check sugar at home - denies frank hypoglycemic episodes  Chronic back pain / lower leg pain - seeing [facility], following up with them for possible repeat back injection - decently controlled with tramadol, some muscle spasms "sometimes," and requests refill of tramadol - denies frank weakness in LE but pain does increase with ambulating long distances / times  Pt is a former smoker. In addition to the above documentation, pt's PMH, surgical history, FH, and SH all reviewed and updated where appropriate in the EMR. I have also reviewed and updated the pt's allergies and current medications as appropriate.  Review of Systems: As above. Otherwise, full 12-system ROS was reviewed and all negative.     Objective:   Physical Exam BP 154/94 mmHg  Pulse 60  Ht 6\' 4"  (1.93 m)  Wt 217 lb (98.431 kg)  BMI 26.43 kg/m2 Gen: well-appearing adult male in NAD HEENT: [place]/AT, sclerae/conjunctivae clear, no lid lag, EOMI, PERRLA   MMM, posterior oropharynx clear, no cervical lymphadenopathy  neck supple with full ROM, no masses appreciated; thyroid not enlarged  Cardio: RRR, no murmur appreciated; distal pulses intact/symmetric Pulm: CTAB, no wheezes, normal WOB  Abd: soft, nondistended, BS+, no HSM Ext: warm/well-perfused, no cyanosis/clubbing/edema MSK: strength 5/5 in upper ext, 4+/5 in lower extremities due to pain, no frank joint deformity/effusion  normal ROM to all four extremities  Tender in lumbar area  diffusely, midline over back and in paraspinal musculature Neuro/Psych: alert/oriented, sensation grossly intact; normal gait/balance  mood euthymic with congruent affect  Diabetic foot exam: Diminished sensation throughout foot, especially in midfoot and toes, bilaterally.  Very dry skin with a small excoriation to right dorsal foot, but no induration, redness, or bleeding / drainage.  Thick callus to right heel / medial foot surface.  Pulses faint but intact / symmetric     Assessment & Plan:  See problem list notes.

## 2013-12-02 NOTE — Assessment & Plan Note (Signed)
No new / different symptoms or red flags on history or exam. Following up with orthopedics and considering repeat steroid injection. Refilled tramadol today. F/u with ortho as instruction and with me PRN.

## 2013-12-02 NOTE — Patient Instructions (Signed)
Thank you for coming in, today!  I will check some some labs today. I'll call your or send you a letter. I will refer you to a foot doctor for your foot callus.  I refilled your amlodipine for blood pressure. I will also refill your tramadol, today. Make sure you follow up with the orthopedist.  Come back to see me in a few months. When you come back, we'll talk about some more testing, screening, etc. Please feel free to call with any questions or concerns at any time, at [phone]. --Dr. Casper Harrison

## 2013-12-02 NOTE — Assessment & Plan Note (Signed)
A: Reports compliance with metformin and A1c 6.7, today. Denies hypoglycemic symptoms  P: Continue metformin, ASA, statin, BP meds; see separate problem list notes. Checking CMP today for kidney / liver function. F/u in another 3 months.

## 2013-12-02 NOTE — Assessment & Plan Note (Signed)
Present to right heel, no frank skin breakdown, ulceration, bleeding, or drainage. Referred to podiatry today per pt request.

## 2013-12-03 ENCOUNTER — Encounter: Payer: Self-pay | Admitting: Family Medicine

## 2013-12-21 ENCOUNTER — Ambulatory Visit (INDEPENDENT_AMBULATORY_CARE_PROVIDER_SITE_OTHER): Payer: Commercial Managed Care - HMO

## 2013-12-21 ENCOUNTER — Ambulatory Visit (INDEPENDENT_AMBULATORY_CARE_PROVIDER_SITE_OTHER): Payer: Commercial Managed Care - HMO | Admitting: Podiatry

## 2013-12-21 VITALS — BP 150/90 | HR 62 | Resp 18 | Ht 76.0 in | Wt 225.0 lb

## 2013-12-21 DIAGNOSIS — M79676 Pain in unspecified toe(s): Secondary | ICD-10-CM

## 2013-12-21 DIAGNOSIS — L603 Nail dystrophy: Secondary | ICD-10-CM

## 2013-12-21 DIAGNOSIS — E119 Type 2 diabetes mellitus without complications: Secondary | ICD-10-CM

## 2013-12-21 NOTE — Progress Notes (Signed)
   Subjective:    Patient ID: Brian Burns, male    DOB: August 22, 1945, 68 y.o.   MRN: [id number]  HPI Comments: i was referred over here , i have sugar. There is something on the top of my foot that he wanted looked at . Not taken any medication for diabetes, he told me i was on the borderline of being diabetic Last a1c 6.7    Diabetes      Review of Systems  HENT:       Hearing loss   Genitourinary: Positive for frequency.  Musculoskeletal: Positive for back pain.  All other systems reviewed and are negative.      Objective:   Physical Exam: I have reviewed his past medical history medications allergy surgery social history and review of systems. Pulses are strongly palpable bilateral. Neurologic sensorium is intact per Semmes-Weinstein monofilament. Deep tendon reflexes are intact bilateral and muscle strength is 5 over 5 dorsiflexion plantar flexors inverters and everters all of the musculature is intact. Orthopedic evaluation demonstrates no obvious osseous abnormalities. Radiographic evaluation does not demonstrate any type of osseus abnormalities of the good strong healthy appearing bone. Cutaneous evaluation demonstrates some dry xerotic skin to the plantar aspect of the bilateral foot with a superficial excoriation to the dorsal aspect of the right foot which appears to be healing normally. I see no signs of infection other than the onychomycosis to the hallux nail plates.        Assessment & Plan:  Assessment: Diabetes with onychomycosis bilateral.  Plan: Debridement of nails 1 through 5 bilateral cover service secondary to pain follow up with him in 3 months for re-debridement.

## 2014-02-22 DIAGNOSIS — M5416 Radiculopathy, lumbar region: Secondary | ICD-10-CM | POA: Diagnosis not present

## 2014-02-24 ENCOUNTER — Other Ambulatory Visit: Payer: Self-pay | Admitting: *Deleted

## 2014-02-24 MED ORDER — AMLODIPINE BESYLATE 10 MG PO TABS: 10.0000 mg | ORAL_TABLET | Freq: Every day | ORAL | Status: DC

## 2014-03-04 ENCOUNTER — Telehealth: Payer: Self-pay | Admitting: Family Medicine

## 2014-03-04 DIAGNOSIS — M545 Low back pain, unspecified: Secondary | ICD-10-CM

## 2014-03-04 NOTE — Telephone Encounter (Signed)
Needs [employer] [facility] referral, pt coming in for back pain, dx code is M54.5, pt seeing Dr. Ethelene Hal, his NPI is [id number]

## 2014-03-06 NOTE — Telephone Encounter (Signed)
Referral for ortho placed (Dr. Ethelene Hal is at [facility]). Thanks. --CMS

## 2014-03-16 ENCOUNTER — Ambulatory Visit: Payer: Commercial Managed Care - HMO | Admitting: Family Medicine

## 2014-03-18 ENCOUNTER — Ambulatory Visit (INDEPENDENT_AMBULATORY_CARE_PROVIDER_SITE_OTHER): Payer: Commercial Managed Care - HMO | Admitting: Family Medicine

## 2014-03-18 ENCOUNTER — Encounter: Payer: Self-pay | Admitting: Family Medicine

## 2014-03-18 VITALS — BP 122/67 | HR 71 | Temp 97.5°F | Ht 77.0 in | Wt 202.1 lb

## 2014-03-18 DIAGNOSIS — R634 Abnormal weight loss: Secondary | ICD-10-CM

## 2014-03-18 DIAGNOSIS — E119 Type 2 diabetes mellitus without complications: Secondary | ICD-10-CM | POA: Diagnosis not present

## 2014-03-18 DIAGNOSIS — R399 Unspecified symptoms and signs involving the genitourinary system: Secondary | ICD-10-CM

## 2014-03-18 DIAGNOSIS — R3989 Other symptoms and signs involving the genitourinary system: Secondary | ICD-10-CM | POA: Diagnosis not present

## 2014-03-18 DIAGNOSIS — R358 Other polyuria: Secondary | ICD-10-CM | POA: Insufficient documentation

## 2014-03-18 DIAGNOSIS — R300 Dysuria: Secondary | ICD-10-CM

## 2014-03-18 DIAGNOSIS — R3589 Other polyuria: Secondary | ICD-10-CM | POA: Insufficient documentation

## 2014-03-18 LAB — CBC
HCT: 36.6 % — ABNORMAL LOW (ref 39.0–52.0)
HEMOGLOBIN: 12.2 g/dL — AB (ref 13.0–17.0)
MCH: 27.4 pg (ref 26.0–34.0)
MCHC: 33.3 g/dL (ref 30.0–36.0)
MCV: 82.1 fL (ref 78.0–100.0)
MPV: 10.9 fL (ref 8.6–12.4)
PLATELETS: 180 10*3/uL (ref 150–400)
RBC: 4.46 MIL/uL (ref 4.22–5.81)
RDW: 15.7 % — ABNORMAL HIGH (ref 11.5–15.5)
WBC: 5.2 10*3/uL (ref 4.0–10.5)

## 2014-03-18 LAB — POCT URINALYSIS DIPSTICK
Bilirubin, UA: NEGATIVE
Glucose, UA: NEGATIVE
Ketones, UA: NEGATIVE
Nitrite, UA: NEGATIVE
Protein, UA: 100
Spec Grav, UA: >=1.03
Urobilinogen, UA: 0.2
pH, UA: 6

## 2014-03-18 LAB — POCT GLYCOSYLATED HEMOGLOBIN (HGB A1C): Hemoglobin A1C: 6.4

## 2014-03-18 LAB — COMPREHENSIVE METABOLIC PANEL
ALT: 23 U/L (ref 0–53)
AST: 25 U/L (ref 0–37)
Albumin: 4.3 g/dL (ref 3.5–5.2)
Alkaline Phosphatase: 133 U/L — ABNORMAL HIGH (ref 39–117)
BUN: 17 mg/dL (ref 6–23)
CHLORIDE: 103 meq/L (ref 96–112)
CO2: 27 mEq/L (ref 19–32)
Calcium: 9 mg/dL (ref 8.4–10.5)
Creat: 1.3 mg/dL (ref 0.50–1.35)
Glucose, Bld: 97 mg/dL (ref 70–99)
Potassium: 4.3 mEq/L (ref 3.5–5.3)
Sodium: 138 mEq/L (ref 135–145)
Total Bilirubin: 0.8 mg/dL (ref 0.2–1.2)
Total Protein: 7.1 g/dL (ref 6.0–8.3)

## 2014-03-18 LAB — POCT UA - MICROSCOPIC ONLY

## 2014-03-18 LAB — GLUCOSE, CAPILLARY: Glucose-Capillary: 98 mg/dL (ref 70–99)

## 2014-03-18 MED ORDER — TRAMADOL HCL 50 MG PO TABS: 50.0000 mg | ORAL_TABLET | Freq: Three times a day (TID) | ORAL | Status: DC | PRN

## 2014-03-18 MED ORDER — CIPROFLOXACIN HCL 500 MG PO TABS: 500.0000 mg | ORAL_TABLET | Freq: Two times a day (BID) | ORAL | Status: DC

## 2014-03-18 NOTE — Addendum Note (Signed)
Addended by: Jennette Bill on: 03/18/2014 02:19 PM   Modules accepted: Orders

## 2014-03-18 NOTE — Progress Notes (Signed)
   Subjective:    Patient ID: Brian Burns, male    DOB: [birth year], 69 y.o.   MRN: [id number]  HPI Patient for SDA for complaint of urinary frequency and dysuria, reports awakening to void 12-15 times a night for the past 1 month.  Reports frequent urination during the day as well, but does not quantify.  Denies fevers or chills or sweats, denies penile discharge. Reports feeling that he has adequate bladder emptying when he voids.  Unable to give urine sample upon arrival to clinic, gives sample at end of the visit after drinking water.  ROS: Denies prior history of UTIs.  Does not check his CBG at home, denies feeling overly thirsty. No nausea/vomiting or changes in bowel habits. No frank hematuria.  No abdominal pain or diarrhea.Denies cough.    Social Hx; Quit smoking and drinking alcohol about 15 years ago.   Review of Systems     Objective:   Physical Exam  Generally well appearing, no apparent distress.  HEENT Neck supple, moist mucus membranes. Clear oropharynx. No cervical adenopathy.  COR Regular S1S2 PULM Clear bilaterally,no rales or wheezes ABD Soft, nontender, nondistended. No CVA tenderness.  DRE: Normal sphincter tone. Markedly enlarged and symmetrical prostate gland, nontender, non-nodular.  EXTS without edema in ankles.       Assessment & Plan:

## 2014-03-18 NOTE — Patient Instructions (Signed)
It was a pleasure to see you today.  It is important to collect a sample of your urine to evaluate the frequent urination and discomfort.   Lab work done today for evaluation of kidney function, sugar levels.  I will call you at your home [phone] once I have the results of the urine and blood tests.   Follow up appointment with Dr. Casper Harrison in the coming 2 weeks.

## 2014-03-18 NOTE — Assessment & Plan Note (Signed)
Reports burning with void, as well as nocturia 12-15x/noc for the past 1 month (previously would visit the bathroom 4-5x/noc over several months/years). No prostatic tenderness, does have symmetrically enlarged prostate without nodularity on DRE.  Given abnormalities on UA in office will treat empirically for cystitis with FQ for 7 days. Urine culture sent today.

## 2014-03-21 LAB — URINE CULTURE

## 2014-03-23 ENCOUNTER — Encounter: Payer: Self-pay | Admitting: Family Medicine

## 2014-03-23 ENCOUNTER — Telehealth: Payer: Self-pay | Admitting: Family Medicine

## 2014-03-23 NOTE — Telephone Encounter (Signed)
Called patient's home to report results of urine culture, which grew >100K colonies Enterococcus which is susceptible to levofloxacin (which he was prescribed).  A woman answered the phone, said he was not in and she would relay message that I had called.  I will send letter to patient home.   JB

## 2014-03-24 ENCOUNTER — Ambulatory Visit (INDEPENDENT_AMBULATORY_CARE_PROVIDER_SITE_OTHER): Payer: Commercial Managed Care - HMO | Admitting: Podiatry

## 2014-03-24 DIAGNOSIS — L603 Nail dystrophy: Secondary | ICD-10-CM | POA: Diagnosis not present

## 2014-03-24 DIAGNOSIS — E119 Type 2 diabetes mellitus without complications: Secondary | ICD-10-CM

## 2014-03-24 NOTE — Progress Notes (Signed)
Presents today chief complaint of painful elongated toenails.  Objective: Pulses are palpable bilateral nails are thick, yellow dystrophic onychomycosis and painful palpation.   Assessment: Onychomycosis with pain in limb.  Plan: Treatment of nails in thickness and length as covered service secondary to pain.  

## 2014-03-25 ENCOUNTER — Other Ambulatory Visit: Payer: Self-pay | Admitting: *Deleted

## 2014-03-25 DIAGNOSIS — M4807 Spinal stenosis, lumbosacral region: Secondary | ICD-10-CM | POA: Diagnosis not present

## 2014-03-25 DIAGNOSIS — M5416 Radiculopathy, lumbar region: Secondary | ICD-10-CM | POA: Diagnosis not present

## 2014-03-25 MED ORDER — ATORVASTATIN CALCIUM 40 MG PO TABS: 40.0000 mg | ORAL_TABLET | Freq: Every day | ORAL | Status: DC

## 2014-03-28 ENCOUNTER — Telehealth: Payer: Self-pay | Admitting: Family Medicine

## 2014-03-28 NOTE — Telephone Encounter (Signed)
Pt called and needs some refills but is unsure which ones he needs he thought it was BP medication and others please call to discuss jw

## 2014-03-29 ENCOUNTER — Other Ambulatory Visit: Payer: Self-pay | Admitting: Family Medicine

## 2014-03-29 ENCOUNTER — Telehealth: Payer: Self-pay | Admitting: Family Medicine

## 2014-03-29 NOTE — Telephone Encounter (Signed)
Patient requests

## 2014-03-29 NOTE — Telephone Encounter (Signed)
Pt called and said that he needs refills on his tramadol and also he thought that the doctor was suppose to call in some kind medication or pill for the infection he had in his urine. jw

## 2014-03-30 ENCOUNTER — Other Ambulatory Visit: Payer: Self-pay | Admitting: Family Medicine

## 2014-03-30 DIAGNOSIS — N3000 Acute cystitis without hematuria: Secondary | ICD-10-CM

## 2014-03-30 MED ORDER — NITROFURANTOIN MONOHYD MACRO 100 MG PO CAPS: 100.0000 mg | ORAL_CAPSULE | Freq: Two times a day (BID) | ORAL | Status: DC

## 2014-03-30 NOTE — Telephone Encounter (Signed)
Antibiotic changed for urinary tract infection (nitrofurantoin 100mg  take 1 by mouth twice daily for 5 days).  Needs follow up if not improving, and to address other clinical concerns.  JB

## 2014-03-30 NOTE — Telephone Encounter (Signed)
Seen by Dr. Mauricio Po on 2/19 and prescribed Cipro. Pt requesting a refill of Cipro. Also, patient complains of polyuria. Going to the restroom 9-10x at night. Please advise.

## 2014-03-31 NOTE — Telephone Encounter (Signed)
I agree with Dr. Mauricio Po, tramadol should be prescribed in a visit.   Murtis Sink, MD [facility] Resident, PGY-3 03/31/2014, 9:12 AM

## 2014-04-01 NOTE — Telephone Encounter (Signed)
Patient informed, appointment scheduled. 

## 2014-04-01 NOTE — Telephone Encounter (Signed)
Patient informed, expressed understanding. 

## 2014-04-07 ENCOUNTER — Encounter: Payer: Self-pay | Admitting: Family Medicine

## 2014-04-07 ENCOUNTER — Ambulatory Visit (INDEPENDENT_AMBULATORY_CARE_PROVIDER_SITE_OTHER): Payer: Commercial Managed Care - HMO | Admitting: Family Medicine

## 2014-04-07 VITALS — BP 136/74 | HR 56 | Temp 97.7°F | Ht 76.0 in | Wt 213.5 lb

## 2014-04-07 DIAGNOSIS — N400 Enlarged prostate without lower urinary tract symptoms: Secondary | ICD-10-CM | POA: Diagnosis not present

## 2014-04-07 DIAGNOSIS — M25561 Pain in right knee: Secondary | ICD-10-CM | POA: Diagnosis not present

## 2014-04-07 DIAGNOSIS — R350 Frequency of micturition: Secondary | ICD-10-CM | POA: Diagnosis not present

## 2014-04-07 DIAGNOSIS — M25562 Pain in left knee: Secondary | ICD-10-CM | POA: Diagnosis not present

## 2014-04-07 LAB — POCT URINALYSIS DIPSTICK
BILIRUBIN UA: NEGATIVE
GLUCOSE UA: NEGATIVE
KETONES UA: NEGATIVE
NITRITE UA: NEGATIVE
RBC UA: NEGATIVE
Spec Grav, UA: 1.025
Urobilinogen, UA: 0.2
pH, UA: 6

## 2014-04-07 LAB — POCT UA - MICROSCOPIC ONLY

## 2014-04-07 MED ORDER — TAMSULOSIN HCL 0.4 MG PO CAPS: 0.4000 mg | ORAL_CAPSULE | Freq: Every day | ORAL | Status: DC

## 2014-04-07 MED ORDER — TRAMADOL HCL 50 MG PO TABS: 50.0000 mg | ORAL_TABLET | Freq: Three times a day (TID) | ORAL | Status: DC | PRN

## 2014-04-07 NOTE — Patient Instructions (Signed)
Thank you for coming in, today!  You do have a large prostate, which is probably causing most of your symptoms. We will check your urine for infection but I don't think it is infected, still. I want you to start taking Flomax (tamsulosin) again, once per day. You have taken this in the past and it seemed to help.  I will refill your tramadol for your knee pain. Follow up with the orthopedics doctors as you need. Come back to see me as you need.  Please feel free to call with any questions or concerns at any time, at [phone]. --Dr. Casper Harrison

## 2014-04-07 NOTE — Progress Notes (Signed)
   Subjective:    Patient ID: Brian Burns, male    DOB: Sep 24, 1945, 69 y.o.   MRN: [id number]  HPI: Pt presents to clinic for complaint of knee pain and urinary frequency.   Knee pain is chronic and degenerative, and generally well-managed with tramadol. Pt requests refill of tramadol, today. He is seeing an orthopedist for chronic back pain, and states he also plans to ask the orthopedist about steroid injections for his back and knees. He denies numbness / weakness or his knees giving out. They do occasionally pop / catch. He walks with a can and feels like he "gets around okay," and the "tramadol works great" to help him be more mobile.  Pt does report increased urinary frequency for 2-3 months. He was seen by Dr. Mauricio Po on 2/19 and was treated for a UTI. His prostate was felt to be enlarged at that time. He was having dysuria at that time, but now has no dysuria. He does not have blood in his urine. He does not have dribbling. He feels like when he urinates, he needs to urinate more (sensation of incomplete emptying). He has a history of urethral stricture and has seen urology in the past, but is not interested in seeing them again, as "they wanted to put a camera up there and I'm not interested in that or seeing them again." He has been on Flomax in the past, which he feels helped significantly. He's not sure when he last took it.  Review of Systems: As above. He has no fevers / chills, N/V, abdominal pain.     Objective:   Physical Exam BP 136/74 mmHg  Pulse 56  Temp(Src) 97.7 F (36.5 C) (Oral)  Ht 6\' 4"  (1.93 m)  Wt 213 lb 8 oz (96.843 kg)  BMI 26.00 kg/m2 Gen: well-appearing adult male in NAD HEENT: [place]/AT, EOMI, MMM Cardio: RRR, no murmur appreciated Pulm: CTAB, no wheezes Abd: soft, nontender, BS+  No CVA tenderness, no flank tenderness  No suprapubic tenderness, no abdominal masses appreciated MSK: bilateral knees normal to inspection, no gross deformity No  warmth or redness to bilateral knees, no effusions Mild diffuse tenderness to palpation to knees bilaterally No specific joint line tenderness, with full ROM bilaterally and preserved LE strength Negative pain with varus and valgus stress to knees bilaterally Pt able to stand / walk without assistance though he does require a cane  Urine:  UA: trace leukocytes and protein, no blood, no nitrites  Micro: few bacteria and WBC, but squamous skin cells present     Assessment & Plan:  69yo male with known BPH, now symptomatic off of Floxmax for an uncertain amount of time (months to a year or more) - UA not frankly suggestive of infection but will repeat culture given definite single-organism infection in the past month and known hx of urethral stricture - discussed possible re-referral to urology but pt is not interested in this at this time - renewed Rx for Flomax as this helped significantly in the past - refilled tramadol for chronic knee pain as well; doubt tramadol is having significant effect on urine (pt does not complain of typical narcotic-induced urinary retention) - f/u PRN  Bobbye Morton, MD PGY-3, [facility] 04/07/2014, 10:36 PM

## 2014-04-08 ENCOUNTER — Telehealth: Payer: Self-pay | Admitting: Family Medicine

## 2014-04-08 DIAGNOSIS — H547 Unspecified visual loss: Secondary | ICD-10-CM

## 2014-04-08 LAB — URINE CULTURE: Colony Count: 30000

## 2014-04-08 NOTE — Telephone Encounter (Signed)
Pt is requesting a referral to Dr. Elmer Picker for an eye exam.

## 2014-04-08 NOTE — Telephone Encounter (Signed)
Pt asking for a referral to Dr. Elmer Picker, his opththalmologist on Thursday next week.

## 2014-04-08 NOTE — Telephone Encounter (Signed)
Red Team: Ophtho referral placed, today. Please let him know. Thanks! --CMS

## 2014-04-08 NOTE — Telephone Encounter (Signed)
Pt wants an appt on Thursday, office closed when i called. Will try again Monday. Deseree Bruna Potter, CMA

## 2014-04-14 ENCOUNTER — Telehealth: Payer: Self-pay | Admitting: Family Medicine

## 2014-04-14 DIAGNOSIS — H35033 Hypertensive retinopathy, bilateral: Secondary | ICD-10-CM | POA: Diagnosis not present

## 2014-04-14 DIAGNOSIS — Z961 Presence of intraocular lens: Secondary | ICD-10-CM | POA: Diagnosis not present

## 2014-04-14 DIAGNOSIS — H40033 Anatomical narrow angle, bilateral: Secondary | ICD-10-CM | POA: Diagnosis not present

## 2014-04-14 DIAGNOSIS — H524 Presbyopia: Secondary | ICD-10-CM | POA: Diagnosis not present

## 2014-04-14 NOTE — Telephone Encounter (Signed)
Needs referral to see eye dr Has appt at 12:30 today Dr Oswaldo Done  [address] [facility] [address] [phone]

## 2014-04-14 NOTE — Telephone Encounter (Signed)
[employer] auth obtained. #[id number]

## 2014-04-28 DIAGNOSIS — H52209 Unspecified astigmatism, unspecified eye: Secondary | ICD-10-CM | POA: Diagnosis not present

## 2014-04-28 DIAGNOSIS — H5213 Myopia, bilateral: Secondary | ICD-10-CM | POA: Diagnosis not present

## 2014-04-28 DIAGNOSIS — H524 Presbyopia: Secondary | ICD-10-CM | POA: Diagnosis not present

## 2014-04-28 DIAGNOSIS — Z0100 Encounter for examination of eyes and vision without abnormal findings: Secondary | ICD-10-CM | POA: Diagnosis not present

## 2014-05-15 ENCOUNTER — Encounter (HOSPITAL_COMMUNITY): Payer: Self-pay

## 2014-05-15 ENCOUNTER — Encounter (HOSPITAL_COMMUNITY)
Admission: EM | Disposition: A | Discharge status: Home / Self Care | Payer: Self-pay | Source: Home / Self Care | Attending: Emergency Medicine

## 2014-05-15 ENCOUNTER — Emergency Department (INDEPENDENT_AMBULATORY_CARE_PROVIDER_SITE_OTHER)
Admission: EM | Admit: 2014-05-15 | Discharge: 2014-05-15 | Disposition: A | Discharge status: Other Acute Inpatient Hospital | Payer: Commercial Managed Care - HMO | Source: Home / Self Care | Attending: Family Medicine | Admitting: Family Medicine

## 2014-05-15 ENCOUNTER — Encounter (HOSPITAL_COMMUNITY): Payer: Self-pay | Admitting: *Deleted

## 2014-05-15 ENCOUNTER — Ambulatory Visit (HOSPITAL_COMMUNITY)
Admission: EM | Admit: 2014-05-15 | Discharge: 2014-05-15 | Disposition: A | Discharge status: Home / Self Care | Payer: Commercial Managed Care - HMO | Attending: Emergency Medicine | Admitting: Emergency Medicine

## 2014-05-15 DIAGNOSIS — I739 Peripheral vascular disease, unspecified: Secondary | ICD-10-CM | POA: Insufficient documentation

## 2014-05-15 DIAGNOSIS — T18108A Unspecified foreign body in esophagus causing other injury, initial encounter: Secondary | ICD-10-CM

## 2014-05-15 DIAGNOSIS — R1310 Dysphagia, unspecified: Secondary | ICD-10-CM

## 2014-05-15 DIAGNOSIS — I2510 Atherosclerotic heart disease of native coronary artery without angina pectoris: Secondary | ICD-10-CM | POA: Insufficient documentation

## 2014-05-15 DIAGNOSIS — Z87891 Personal history of nicotine dependence: Secondary | ICD-10-CM | POA: Insufficient documentation

## 2014-05-15 DIAGNOSIS — Z7982 Long term (current) use of aspirin: Secondary | ICD-10-CM | POA: Insufficient documentation

## 2014-05-15 DIAGNOSIS — R0789 Other chest pain: Secondary | ICD-10-CM

## 2014-05-15 DIAGNOSIS — K222 Esophageal obstruction: Secondary | ICD-10-CM | POA: Diagnosis not present

## 2014-05-15 DIAGNOSIS — M1990 Unspecified osteoarthritis, unspecified site: Secondary | ICD-10-CM | POA: Diagnosis not present

## 2014-05-15 DIAGNOSIS — T18128A Food in esophagus causing other injury, initial encounter: Secondary | ICD-10-CM | POA: Diagnosis not present

## 2014-05-15 DIAGNOSIS — E785 Hyperlipidemia, unspecified: Secondary | ICD-10-CM | POA: Insufficient documentation

## 2014-05-15 DIAGNOSIS — I10 Essential (primary) hypertension: Secondary | ICD-10-CM | POA: Insufficient documentation

## 2014-05-15 DIAGNOSIS — Z8673 Personal history of transient ischemic attack (TIA), and cerebral infarction without residual deficits: Secondary | ICD-10-CM | POA: Diagnosis not present

## 2014-05-15 DIAGNOSIS — X58XXXA Exposure to other specified factors, initial encounter: Secondary | ICD-10-CM | POA: Diagnosis not present

## 2014-05-15 DIAGNOSIS — K219 Gastro-esophageal reflux disease without esophagitis: Secondary | ICD-10-CM | POA: Diagnosis not present

## 2014-05-15 HISTORY — PX: ESOPHAGOGASTRODUODENOSCOPY: SHX5428

## 2014-05-15 LAB — COMPREHENSIVE METABOLIC PANEL
ALBUMIN: 3.6 g/dL (ref 3.5–5.2)
ALK PHOS: 93 U/L (ref 39–117)
ALT: 19 U/L (ref 0–53)
ANION GAP: 7 (ref 5–15)
AST: 23 U/L (ref 0–37)
BUN: 12 mg/dL (ref 6–23)
CHLORIDE: 102 mmol/L (ref 96–112)
CO2: 26 mmol/L (ref 19–32)
Calcium: 8.8 mg/dL (ref 8.4–10.5)
Creatinine, Ser: 1.48 mg/dL — ABNORMAL HIGH (ref 0.50–1.35)
GFR calc Af Amer: 55 mL/min — ABNORMAL LOW (ref >90)
GFR, EST NON AFRICAN AMERICAN: 47 mL/min — AB (ref >90)
Glucose, Bld: 108 mg/dL — ABNORMAL HIGH (ref 70–99)
POTASSIUM: 3.3 mmol/L — AB (ref 3.5–5.1)
Sodium: 135 mmol/L (ref 135–145)
TOTAL PROTEIN: 7.6 g/dL (ref 6.0–8.3)
Total Bilirubin: 0.5 mg/dL (ref 0.3–1.2)

## 2014-05-15 LAB — CBC WITH DIFFERENTIAL/PLATELET
BASOS PCT: 0 % (ref 0–1)
Basophils Absolute: 0 10*3/uL (ref 0.0–0.1)
Eosinophils Absolute: 0.1 10*3/uL (ref 0.0–0.7)
Eosinophils Relative: 2 % (ref 0–5)
HCT: 37.9 % — ABNORMAL LOW (ref 39.0–52.0)
HEMOGLOBIN: 12.3 g/dL — AB (ref 13.0–17.0)
Lymphocytes Relative: 41 % (ref 12–46)
Lymphs Abs: 2 10*3/uL (ref 0.7–4.0)
MCH: 27.3 pg (ref 26.0–34.0)
MCHC: 32.5 g/dL (ref 30.0–36.0)
MCV: 84 fL (ref 78.0–100.0)
Monocytes Absolute: 0.4 10*3/uL (ref 0.1–1.0)
Monocytes Relative: 8 % (ref 3–12)
Neutro Abs: 2.4 10*3/uL (ref 1.7–7.7)
Neutrophils Relative %: 49 % (ref 43–77)
Platelets: 155 10*3/uL (ref 150–400)
RBC: 4.51 MIL/uL (ref 4.22–5.81)
RDW: 15.1 % (ref 11.5–15.5)
WBC: 4.8 10*3/uL (ref 4.0–10.5)

## 2014-05-15 SURGERY — EGD (ESOPHAGOGASTRODUODENOSCOPY)
Anesthesia: Moderate Sedation

## 2014-05-15 MED ORDER — FENTANYL CITRATE (PF) 100 MCG/2ML IJ SOLN
INTRAMUSCULAR | Status: DC | PRN
  Administered 2014-05-15: 12.5 ug via INTRAVENOUS
  Administered 2014-05-15: 25 ug via INTRAVENOUS

## 2014-05-15 MED ORDER — BUTAMBEN-TETRACAINE-BENZOCAINE 2-2-14 % EX AERO
INHALATION_SPRAY | CUTANEOUS | Status: DC | PRN
  Administered 2014-05-15: 2 via TOPICAL

## 2014-05-15 MED ORDER — FENTANYL CITRATE (PF) 100 MCG/2ML IJ SOLN
INTRAMUSCULAR | Status: AC
  Filled 2014-05-15: qty 4

## 2014-05-15 MED ORDER — MIDAZOLAM HCL 10 MG/2ML IJ SOLN
INTRAMUSCULAR | Status: DC | PRN
  Administered 2014-05-15: 2 mg via INTRAVENOUS
  Administered 2014-05-15 (×2): 1 mg via INTRAVENOUS

## 2014-05-15 MED ORDER — DIPHENHYDRAMINE HCL 50 MG/ML IJ SOLN
INTRAMUSCULAR | Status: AC
  Filled 2014-05-15: qty 1

## 2014-05-15 MED ORDER — GLUCAGON HCL RDNA (DIAGNOSTIC) 1 MG IJ SOLR
1.0000 mg | Freq: Once | INTRAMUSCULAR | Status: AC
  Administered 2014-05-15: 1 mg via INTRAVENOUS
  Filled 2014-05-15: qty 1

## 2014-05-15 MED ORDER — MIDAZOLAM HCL 5 MG/ML IJ SOLN
INTRAMUSCULAR | Status: AC
  Filled 2014-05-15: qty 3

## 2014-05-15 MED ORDER — SODIUM CHLORIDE 0.9 % IV SOLN
INTRAVENOUS | Status: DC
  Administered 2014-05-15: 125 mL/h via INTRAVENOUS

## 2014-05-15 NOTE — ED Notes (Signed)
Endoscopy called stated will perform procedure approximately 1830.

## 2014-05-15 NOTE — ED Notes (Signed)
GI Doctor and GI team completed procedure.

## 2014-05-15 NOTE — ED Notes (Signed)
States he has developed problem w needing to spit, ever since he was eating spare ribs earlier this PM. C/o tightness in chest, unable drink water or swallow his own saliva. W/d/color good. . C/o pain '(' on 1-10 scale

## 2014-05-15 NOTE — H&P (View-Only) (Signed)
[facility] Reason for consult:Food Impactiion Referring Physician: ER. PCP: [facility]. Primary G.I.: none patient unassigned  Brian Burns is an 69 y.o. male.  HPI: he presented to the emergency room after eating ribs this afternoon and being unable to swallow regurgitating liquids and saliva. He's having pain in his chest feels like to meet got stuck. He's gagged and regurgitated but the obstruction is not been released. Patient was given water and emergency room immediately regurgitated back up. He has a history of esophageal reflux. He has never had EGD. Omeprazole is listed on his medication list but he states that he is not taking it. When he has heartburn he takes Alka-Seltzer which relieves the symptoms. He's had a great deal of difficulty swallowing his food recently because of poor dentition and inability to chew his food adequately. He has had a history of peripheral vascular disease and has had previous strokes for which he has recovered. He states that he is not on any blood thiners other than aspirin. He has type II diabetes. Patient is continued to regurgitate liquid in spite of IV glucagon.  Past Medical History  Diagnosis Date  . Hypertension   . HLD (hyperlipidemia)   . Erectile dysfunction   . Chronic back pain   . CAD (coronary artery disease)   . Carotid artery disease   . PVD (peripheral vascular disease) with claudication   . CVA (cerebrovascular accident) 5/09    Rt sided  . Stroke 01/06/2012  . GERD (gastroesophageal reflux disease)   . Arthritis     Past Surgical History  Procedure Laterality Date  . Eye surgery Left April 2014    Cataract  . Eye surgery Right Jun 16, 2012    Cataract by Dr. Herbert Deaner    Family History  Problem Relation Age of Onset  . Lung disease Brother   . Kidney disease Brother   . Heart disease Brother     died at 10  . Heart disease Mother   . Kidney disease Mother   . Lung disease Mother      Social History:  reports that he quit smoking about 10 years ago. His smoking use included Cigarettes. He has a 20 pack-year smoking history. He has never used smokeless tobacco. He reports that he does not drink alcohol or use illicit drugs.  Allergies:  Allergies  Allergen Reactions  . Novocain [Procaine Hcl] Nausea And Vomiting    Medications; Prior to Admission medications   Medication Sig Start Date End Date Taking? Authorizing Provider  amLODipine (NORVASC) 10 MG tablet Take 1 tablet (10 mg total) by mouth daily. 02/24/14  Yes Sharon Mt Street, MD  aspirin EC 81 MG tablet Take 81 mg by mouth daily.   Yes Historical Provider, MD  atorvastatin (LIPITOR) 40 MG tablet Take 1 tablet (40 mg total) by mouth daily. 03/25/14  Yes Sharon Mt Street, MD  meclizine (ANTIVERT) 25 MG tablet Take 1 tablet (25 mg total) by mouth every 6 (six) hours as needed for dizziness. 08/20/13  Yes Ripley Fraise, MD  OVER THE COUNTER MEDICATION Take 4 tablets by mouth daily as needed (pain). Pain medicine ordered online   Yes Historical Provider, MD  tamsulosin (FLOMAX) 0.4 MG CAPS capsule Take 1 capsule (0.4 mg total) by mouth daily. 04/07/14  Yes Sharon Mt Street, MD  traMADol (ULTRAM) 50 MG tablet Take 1-2 tablets (50-100 mg total) by mouth every 8 (eight) hours as needed for moderate pain. 04/07/14  Yes  Sharon Mt Street, MD  docusate sodium (COLACE) 250 MG capsule Take 1 capsule (250 mg total) by mouth daily. Patient not taking: Reported on 05/15/2014 10/08/13   Willeen Niece, MD  finasteride (PROSCAR) 5 MG tablet Take 1 tablet (5 mg total) by mouth daily. Patient not taking: Reported on 05/15/2014 04/26/13   Sharon Mt Street, MD  gabapentin (NEURONTIN) 100 MG capsule Take 1 capsule (100 mg total) by mouth 3 (three) times daily. Patient not taking: Reported on 05/15/2014 08/02/13   Sharon Mt Street, MD  meclizine (ANTIVERT) 25 MG tablet Take 1 tablet (25 mg total) by mouth 2 (two) times  daily as needed for dizziness. Patient not taking: Reported on 05/15/2014 04/26/13   Sharon Mt Street, MD  metFORMIN (GLUCOPHAGE) 500 MG tablet Take 1 tablet (500 mg total) by mouth daily. Patient not taking: Reported on 05/15/2014 09/14/13   Rosemarie Ax, MD  nitrofurantoin, macrocrystal-monohydrate, (MACROBID) 100 MG capsule Take 1 capsule (100 mg total) by mouth 2 (two) times daily. Patient not taking: Reported on 05/15/2014 03/30/14   Willeen Niece, MD  omeprazole (PRILOSEC) 20 MG capsule Take 1 capsule (20 mg total) by mouth daily. Patient not taking: Reported on 05/15/2014 10/21/13   Sharon Mt Street, MD  ondansetron (ZOFRAN ODT) 4 MG disintegrating tablet Take 1 tablet (4 mg total) by mouth every 8 (eight) hours as needed for nausea or vomiting. Patient not taking: Reported on 05/15/2014 03/17/13   Harvie Heck, PA-C  ondansetron (ZOFRAN ODT) 4 MG disintegrating tablet 11m ODT q4 hours prn nausea/vomit Patient not taking: Reported on 05/15/2014 10/02/13   FPamella Pert MD  sodium chloride (OCEAN) 0.65 % SOLN nasal spray Place 1 spray into both nostrils as needed for congestion. Patient not taking: Reported on 05/15/2014 04/09/13   JBoykin Nearing MD     PRN Meds  Results for orders placed or performed during the hospital encounter of 05/15/14 (from the past 48 hour(s))  CBC with Differential     Status: Abnormal   Collection Time: 05/15/14  4:02 PM  Result Value Ref Range   WBC 4.8 4.0 - 10.5 K/uL   RBC 4.51 4.22 - 5.81 MIL/uL   Hemoglobin 12.3 (L) 13.0 - 17.0 g/dL   HCT 37.9 (L) 39.0 - 52.0 %   MCV 84.0 78.0 - 100.0 fL   MCH 27.3 26.0 - 34.0 pg   MCHC 32.5 30.0 - 36.0 g/dL   RDW 15.1 11.5 - 15.5 %   Platelets 155 150 - 400 K/uL   Neutrophils Relative % 49 43 - 77 %   Neutro Abs 2.4 1.7 - 7.7 K/uL   Lymphocytes Relative 41 12 - 46 %   Lymphs Abs 2.0 0.7 - 4.0 K/uL   Monocytes Relative 8 3 - 12 %   Monocytes Absolute 0.4 0.1 - 1.0 K/uL   Eosinophils Relative 2 0 - 5 %    Eosinophils Absolute 0.1 0.0 - 0.7 K/uL   Basophils Relative 0 0 - 1 %   Basophils Absolute 0.0 0.0 - 0.1 K/uL  Comprehensive metabolic panel     Status: Abnormal   Collection Time: 05/15/14  4:02 PM  Result Value Ref Range   Sodium 135 135 - 145 mmol/L   Potassium 3.3 (L) 3.5 - 5.1 mmol/L   Chloride 102 96 - 112 mmol/L   CO2 26 19 - 32 mmol/L   Glucose, Bld 108 (H) 70 - 99 mg/dL   BUN 12 6 - 23 mg/dL  Creatinine, Ser 1.48 (H) 0.50 - 1.35 mg/dL   Calcium 8.8 8.4 - 10.5 mg/dL   Total Protein 7.6 6.0 - 8.3 g/dL   Albumin 3.6 3.5 - 5.2 g/dL   AST 23 0 - 37 U/L   ALT 19 0 - 53 U/L   Alkaline Phosphatase 93 39 - 117 U/L   Total Bilirubin 0.5 0.3 - 1.2 mg/dL   GFR calc non Af Amer 47 (L) >90 mL/min   GFR calc Af Amer 55 (L) >90 mL/min    Comment: (NOTE) The eGFR has been calculated using the CKD EPI equation. This calculation has not been validated in all clinical situations. eGFR's persistently <90 mL/min signify possible Chronic Kidney Disease.    Anion gap 7 5 - 15    No results found.             Blood pressure 144/91, pulse 64, temperature 97.3 F (36.3 C), resp. rate 18, SpO2 97 %.  Physical exam:   General--alert African-American male who appears to be uncomfortable ENT-- food material in the mouth mucous membranes moist Neck-- no palpable masses Heart-- regular rate and rhythm without murmurs are gallops Lungs--lungs clear Abdomen-- nondistended soft and nontender Psych-- alert and oriented and appears appropriate   Assessment: 1. Food impaction. This is fairly acute and hopefully will be able to be removed. 2. Chronic esophageal reflux 3. History of CAD and cerebrovascular disease 4. Type II diabetes   Plan: will proceed today with EGD. I have discussed this in detail with the patient and his wife including the risk of bleeding, aspiration and possible perforation as well as the need for possible trip to the OR if we are unable to remove this  adequately with endoscopy. He is agreeable to go ahead as soon as possible.   Channin Agustin JR,Adarrius Graeff L 05/15/2014, 6:09 PM

## 2014-05-15 NOTE — ED Notes (Signed)
The pt has  Some sparer rib stuck in his esophagus for the past 2-3 hours.  Water will not pass through.  He has pain in his chest from the obstruction in his esophagus.  No previous history

## 2014-05-15 NOTE — Consult Note (Signed)
[facility] CONSULT Reason for consult:Food Impactiion Referring Physician: ER. PCP: [facility]. Primary G.I.: none patient unassigned  Brian Burns is an 69 y.o. male.  HPI: he presented to the emergency room after eating ribs this afternoon and being unable to swallow regurgitating liquids and saliva. He's having pain in his chest feels like to meet got stuck. He's gagged and regurgitated but the obstruction is not been released. Patient was given water and emergency room immediately regurgitated back up. He has a history of esophageal reflux. He has never had EGD. Omeprazole is listed on his medication list but he states that he is not taking it. When he has heartburn he takes Alka-Seltzer which relieves the symptoms. He's had a great deal of difficulty swallowing his food recently because of poor dentition and inability to chew his food adequately. He has had a history of peripheral vascular disease and has had previous strokes for which he has recovered. He states that he is not on any blood thiners other than aspirin. He has type II diabetes. Patient is continued to regurgitate liquid in spite of IV glucagon.  Past Medical History  Diagnosis Date  . Hypertension   . HLD (hyperlipidemia)   . Erectile dysfunction   . Chronic back pain   . CAD (coronary artery disease)   . Carotid artery disease   . PVD (peripheral vascular disease) with claudication   . CVA (cerebrovascular accident) 5/09    Rt sided  . Stroke 01/06/2012  . GERD (gastroesophageal reflux disease)   . Arthritis     Past Surgical History  Procedure Laterality Date  . Eye surgery Left April 2014    Cataract  . Eye surgery Right Jun 16, 2012    Cataract by Dr. Herbert Deaner    Family History  Problem Relation Age of Onset  . Lung disease Brother   . Kidney disease Brother   . Heart disease Brother     died at 48  . Heart disease Mother   . Kidney disease Mother   . Lung disease Mother      Social History:  reports that he quit smoking about 10 years ago. His smoking use included Cigarettes. He has a 20 pack-year smoking history. He has never used smokeless tobacco. He reports that he does not drink alcohol or use illicit drugs.  Allergies:  Allergies  Allergen Reactions  . Novocain [Procaine Hcl] Nausea And Vomiting    Medications; Prior to Admission medications   Medication Sig Start Date End Date Taking? Authorizing Provider  amLODipine (NORVASC) 10 MG tablet Take 1 tablet (10 mg total) by mouth daily. 02/24/14  Yes Sharon Mt Street, MD  aspirin EC 81 MG tablet Take 81 mg by mouth daily.   Yes Historical Provider, MD  atorvastatin (LIPITOR) 40 MG tablet Take 1 tablet (40 mg total) by mouth daily. 03/25/14  Yes Sharon Mt Street, MD  meclizine (ANTIVERT) 25 MG tablet Take 1 tablet (25 mg total) by mouth every 6 (six) hours as needed for dizziness. 08/20/13  Yes Ripley Fraise, MD  OVER THE COUNTER MEDICATION Take 4 tablets by mouth daily as needed (pain). Pain medicine ordered online   Yes Historical Provider, MD  tamsulosin (FLOMAX) 0.4 MG CAPS capsule Take 1 capsule (0.4 mg total) by mouth daily. 04/07/14  Yes Sharon Mt Street, MD  traMADol (ULTRAM) 50 MG tablet Take 1-2 tablets (50-100 mg total) by mouth every 8 (eight) hours as needed for moderate pain. 04/07/14  Yes  Sharon Mt Street, MD  docusate sodium (COLACE) 250 MG capsule Take 1 capsule (250 mg total) by mouth daily. Patient not taking: Reported on 05/15/2014 10/08/13   Willeen Niece, MD  finasteride (PROSCAR) 5 MG tablet Take 1 tablet (5 mg total) by mouth daily. Patient not taking: Reported on 05/15/2014 04/26/13   Sharon Mt Street, MD  gabapentin (NEURONTIN) 100 MG capsule Take 1 capsule (100 mg total) by mouth 3 (three) times daily. Patient not taking: Reported on 05/15/2014 08/02/13   Sharon Mt Street, MD  meclizine (ANTIVERT) 25 MG tablet Take 1 tablet (25 mg total) by mouth 2 (two) times  daily as needed for dizziness. Patient not taking: Reported on 05/15/2014 04/26/13   Sharon Mt Street, MD  metFORMIN (GLUCOPHAGE) 500 MG tablet Take 1 tablet (500 mg total) by mouth daily. Patient not taking: Reported on 05/15/2014 09/14/13   Rosemarie Ax, MD  nitrofurantoin, macrocrystal-monohydrate, (MACROBID) 100 MG capsule Take 1 capsule (100 mg total) by mouth 2 (two) times daily. Patient not taking: Reported on 05/15/2014 03/30/14   Willeen Niece, MD  omeprazole (PRILOSEC) 20 MG capsule Take 1 capsule (20 mg total) by mouth daily. Patient not taking: Reported on 05/15/2014 10/21/13   Sharon Mt Street, MD  ondansetron (ZOFRAN ODT) 4 MG disintegrating tablet Take 1 tablet (4 mg total) by mouth every 8 (eight) hours as needed for nausea or vomiting. Patient not taking: Reported on 05/15/2014 03/17/13   Harvie Heck, PA-C  ondansetron (ZOFRAN ODT) 4 MG disintegrating tablet 40m ODT q4 hours prn nausea/vomit Patient not taking: Reported on 05/15/2014 10/02/13   FPamella Pert MD  sodium chloride (OCEAN) 0.65 % SOLN nasal spray Place 1 spray into both nostrils as needed for congestion. Patient not taking: Reported on 05/15/2014 04/09/13   JBoykin Nearing MD     PRN Meds  Results for orders placed or performed during the hospital encounter of 05/15/14 (from the past 48 hour(s))  CBC with Differential     Status: Abnormal   Collection Time: 05/15/14  4:02 PM  Result Value Ref Range   WBC 4.8 4.0 - 10.5 K/uL   RBC 4.51 4.22 - 5.81 MIL/uL   Hemoglobin 12.3 (L) 13.0 - 17.0 g/dL   HCT 37.9 (L) 39.0 - 52.0 %   MCV 84.0 78.0 - 100.0 fL   MCH 27.3 26.0 - 34.0 pg   MCHC 32.5 30.0 - 36.0 g/dL   RDW 15.1 11.5 - 15.5 %   Platelets 155 150 - 400 K/uL   Neutrophils Relative % 49 43 - 77 %   Neutro Abs 2.4 1.7 - 7.7 K/uL   Lymphocytes Relative 41 12 - 46 %   Lymphs Abs 2.0 0.7 - 4.0 K/uL   Monocytes Relative 8 3 - 12 %   Monocytes Absolute 0.4 0.1 - 1.0 K/uL   Eosinophils Relative 2 0 - 5 %    Eosinophils Absolute 0.1 0.0 - 0.7 K/uL   Basophils Relative 0 0 - 1 %   Basophils Absolute 0.0 0.0 - 0.1 K/uL  Comprehensive metabolic panel     Status: Abnormal   Collection Time: 05/15/14  4:02 PM  Result Value Ref Range   Sodium 135 135 - 145 mmol/L   Potassium 3.3 (L) 3.5 - 5.1 mmol/L   Chloride 102 96 - 112 mmol/L   CO2 26 19 - 32 mmol/L   Glucose, Bld 108 (H) 70 - 99 mg/dL   BUN 12 6 - 23 mg/dL  Creatinine, Ser 1.48 (H) 0.50 - 1.35 mg/dL   Calcium 8.8 8.4 - 10.5 mg/dL   Total Protein 7.6 6.0 - 8.3 g/dL   Albumin 3.6 3.5 - 5.2 g/dL   AST 23 0 - 37 U/L   ALT 19 0 - 53 U/L   Alkaline Phosphatase 93 39 - 117 U/L   Total Bilirubin 0.5 0.3 - 1.2 mg/dL   GFR calc non Af Amer 47 (L) >90 mL/min   GFR calc Af Amer 55 (L) >90 mL/min    Comment: (NOTE) The eGFR has been calculated using the CKD EPI equation. This calculation has not been validated in all clinical situations. eGFR's persistently <90 mL/min signify possible Chronic Kidney Disease.    Anion gap 7 5 - 15    No results found.             Blood pressure 144/91, pulse 64, temperature 97.3 F (36.3 C), resp. rate 18, SpO2 97 %.  Physical exam:   General--alert African-American male who appears to be uncomfortable ENT-- food material in the mouth mucous membranes moist Neck-- no palpable masses Heart-- regular rate and rhythm without murmurs are gallops Lungs--lungs clear Abdomen-- nondistended soft and nontender Psych-- alert and oriented and appears appropriate   Assessment: 1. Food impaction. This is fairly acute and hopefully will be able to be removed. 2. Chronic esophageal reflux 3. History of CAD and cerebrovascular disease 4. Type II diabetes   Plan: will proceed today with EGD. I have discussed this in detail with the patient and his wife including the risk of bleeding, aspiration and possible perforation as well as the need for possible trip to the OR if we are unable to remove this  adequately with endoscopy. He is agreeable to go ahead as soon as possible.   Yancarlos Berthold JR,Maralyn Witherell L 05/15/2014, 6:09 PM

## 2014-05-15 NOTE — Op Note (Signed)
[facility] [address] [place], [address]   ENDOSCOPY PROCEDURE REPORT  PATIENT: Brian Burns, Brian Burns  MR#: [id number] BIRTHDATE: [birth year] , 69  yrs. old GENDER: male ENDOSCOPIST:Shaliyah Taite [name], MD REFERRED BY: emergency room. PCP: [facility]. Primary G.I.: none PROCEDURE DATE:  11-Jun-2014 PROCEDURE:   EGD w/ fb removal ASA CLASS:    Class IV INDICATIONS: patient eating pork chops today could not achieve them because he has no teeth been unable to swallow regurgitating liquids and saliva. MEDICATION: Fentanyl 37.5 mcg IV and Versed 4 mg IV TOPICAL ANESTHETIC:   Cetacaine Spray  DESCRIPTION OF PROCEDURE:   After the risks and benefits of the procedure were explained, informed consent was obtained.  The Pentax Gastroscope [id number]  endoscope was introduced through the mouth  and advanced to the second portion of the duodenum .  The instrument was slowly withdrawn as the mucosa was fully examined. Estimated blood loss is zero unless otherwise noted in this procedure report.      ESOPHAGUS: Multiple pieces of meat in the esophagus with lots of liquid.  Total of 5 passes made with the scope 5 large pieces of pork chops were removed.  Smaller pieces and liquids were washed through the GE junction.  There was hiatal hernia but no significant stricture.  STOMACH: Food material in the stomach.  DUODENUM: The duodenal mucosa showed no abnormalities. Retroflexed views revealed .    The scope was then withdrawn from the patient and the procedure completed.  COMPLICATIONS: There were no immediate complications.  ENDOSCOPIC IMPRESSION: 1.   Multiple pieces of meat in the esophagus with lots of liquid. Total of 5 passes made with the scope 5 large pieces of pork chops were removed.  Smaller pieces and liquids were washed through the GE junction.  There was hiatal hernia but no significant stricture 2.   Food material in the stomach 3.   The  duodenal mucosa showed no abnormalities RECOMMENDATIONS: Patient will chew food or cut in small pieces and drink liquids between bites.  Routine instructions given.  He will see Dr. [name] back in the office in a month is still having problems.   _______________________________ Rosalie DoctorCarman Ching, MD 06-11-14 7:47 PM     cc:  CPT CODES: ICD CODES:  The ICD and CPT codes recommended by this software are interpretations from the data that the clinical staff has captured with the software.  The verification of the translation of this report to the ICD and CPT codes and modifiers is the sole responsibility of the health care institution and practicing physician where this report was generated.  [employer] will not be held responsible for the validity of the ICD and CPT codes included on this report.  AMA assumes no liability for data contained or not contained herein. CPT is a Publishing rights manager of the [employer].  PATIENT NAME:  [name], [name] MR#: [id number]

## 2014-05-15 NOTE — ED Notes (Signed)
Patient drank one sip of water then a second and immediately spit water out. Doctor notified. Airway intact.

## 2014-05-15 NOTE — ED Notes (Signed)
GI Doctor and GI team at bedside.

## 2014-05-15 NOTE — ED Provider Notes (Signed)
CSN: [id number]     Arrival date & time 05/15/14  1518 History   None    Chief Complaint  Patient presents with  . Dysphagia   (Consider location/radiation/quality/duration/timing/severity/associated sxs/prior Treatment) HPI Comments: Brian Burns is a 69 yo black male who presents with dysphagia since earlier today. He carries a history of TIAs, HTN and GERD. He was eating ribs this afternoon and following this he felt like his "food was stuck". He has pressure with swallowing and the need to try and "throw up". He is having chest pressure with each inability to swallow water or his own saliva. No SOB. No radiating pain. Otherwise feeling well.   The history is provided by the patient.    Past Medical History  Diagnosis Date  . Hypertension   . HLD (hyperlipidemia)   . Erectile dysfunction   . Chronic back pain   . CAD (coronary artery disease)   . Carotid artery disease   . PVD (peripheral vascular disease) with claudication   . CVA (cerebrovascular accident) 5/09    Rt sided  . Stroke 01/06/2012  . GERD (gastroesophageal reflux disease)   . Arthritis    Past Surgical History  Procedure Laterality Date  . Eye surgery Left April 2014    Cataract  . Eye surgery Right Jun 16, 2012    Cataract by Dr. Elmer Picker   Family History  Problem Relation Age of Onset  . Lung disease Brother   . Kidney disease Brother   . Heart disease Brother     died at 98  . Heart disease Mother   . Kidney disease Mother   . Lung disease Mother    History  Substance Use Topics  . Smoking status: Former Smoker -- 0.50 packs/day for 40 years    Types: Cigarettes    Quit date: 01/29/2004  . Smokeless tobacco: Never Used  . Alcohol Use: No    Review of Systems  All other systems reviewed and are negative.   Allergies  Novocain  Home Medications   Prior to Admission medications   Medication Sig Start Date End Date Taking? Authorizing Provider  amLODipine (NORVASC) 10 MG tablet Take 1  tablet (10 mg total) by mouth daily. 02/24/14   Stephanie Coup Street, MD  aspirin 81 MG tablet Take 81 mg by mouth daily.    Historical Provider, MD  atorvastatin (LIPITOR) 40 MG tablet Take 1 tablet (40 mg total) by mouth daily. 03/25/14   Stephanie Coup Street, MD  docusate sodium (COLACE) 250 MG capsule Take 1 capsule (250 mg total) by mouth daily. 10/08/13   Barbaraann Barthel, MD  finasteride (PROSCAR) 5 MG tablet Take 1 tablet (5 mg total) by mouth daily. 04/26/13   Stephanie Coup Street, MD  gabapentin (NEURONTIN) 100 MG capsule Take 100 mg by mouth 3 (three) times daily as needed. Nerve pain.    Historical Provider, MD  gabapentin (NEURONTIN) 100 MG capsule Take 1 capsule (100 mg total) by mouth 3 (three) times daily. 08/02/13   Stephanie Coup Street, MD  HYDROcodone-acetaminophen (NORCO/VICODIN) 5-325 MG per tablet  09/23/13   Historical Provider, MD  meclizine (ANTIVERT) 25 MG tablet Take 1 tablet (25 mg total) by mouth 2 (two) times daily as needed for dizziness. 04/26/13   Stephanie Coup Street, MD  meclizine (ANTIVERT) 25 MG tablet Take 1 tablet (25 mg total) by mouth every 6 (six) hours as needed for dizziness. 08/20/13   [name], MD  metFORMIN (GLUCOPHAGE) 500 MG tablet  Take 1 tablet (500 mg total) by mouth daily. 09/14/13   Myra Rude, MD  nitrofurantoin, macrocrystal-monohydrate, (MACROBID) 100 MG capsule Take 1 capsule (100 mg total) by mouth 2 (two) times daily. 03/30/14   Barbaraann Barthel, MD  omeprazole (PRILOSEC) 20 MG capsule Take 1 capsule (20 mg total) by mouth daily. 10/21/13   Stephanie Coup Street, MD  ondansetron (ZOFRAN ODT) 4 MG disintegrating tablet Take 1 tablet (4 mg total) by mouth every 8 (eight) hours as needed for nausea or vomiting. 03/17/13   Mellody Drown, PA-C  ondansetron (ZOFRAN ODT) 4 MG disintegrating tablet 4mg  ODT q4 hours prn nausea/vomit 10/02/13   Purvis Sheffield, MD  sodium chloride (OCEAN) 0.65 % SOLN nasal spray Place 1 spray into both nostrils as needed for  congestion. 04/09/13   Josalyn Funches, MD  tamsulosin (FLOMAX) 0.4 MG CAPS capsule Take 1 capsule (0.4 mg total) by mouth daily. 04/07/14   Stephanie Coup Street, MD  traMADol (ULTRAM) 50 MG tablet Take 1-2 tablets (50-100 mg total) by mouth every 8 (eight) hours as needed for moderate pain. 04/07/14   Stephanie Coup Street, MD   BP 143/95 mmHg  Pulse 71  Temp(Src) 98.9 F (37.2 C) (Oral)  Resp 20  SpO2 100% Physical Exam  Constitutional: He is oriented to person, place, and time. He appears well-developed and well-nourished. No distress.  Non-toxic appearing, though sitting up on bed spitting and attempting to have emesis. Clear saliva.   Cardiovascular: Normal rate, regular rhythm and normal heart sounds.   Pulmonary/Chest: Effort normal and breath sounds normal. No respiratory distress.  Neurological: He is alert and oriented to person, place, and time.  Skin: Skin is warm and dry. He is not diaphoretic.  Psychiatric: His behavior is normal.  Nursing note and vitals reviewed.   ED Course  EKG  Date/Time: 05/15/2014 3:41 PM Performed by: YOUNG, MICHELLE G Authorized by: Riki Sheer Interpreted by ED physician Comparison: not compared with previous ECG  Rhythm: sinus rhythm Rate: normal QRS axis: normal Clinical impression: normal ECG Comments: Over read to Dr. Artis Flock   (including critical care time) Labs Review Labs Reviewed - No data to display  Imaging Review No results found.   MDM   1. Dysphagia   2. Chest pressure    S/P eating today with ongoing symptoms and pressure. Unable to swallow liquids or saliva. Refer to ED for further imaging and evaluation. Vitals stable, patient non-toxic send by shuttle.     Riki Sheer, PA-C 05/15/14 [phone]

## 2014-05-15 NOTE — Discharge Instructions (Signed)
Continue current meds. Clear liquids for 4-6 hours, if no chest pain or trouble breathing, soft foods tonight.  Regular diet tomorrow. Call for problems. °

## 2014-05-15 NOTE — ED Notes (Signed)
EKG completed in triage.

## 2014-05-15 NOTE — ED Provider Notes (Signed)
CSN: [id number]     Arrival date & time 05/15/14  1547 History   First MD Initiated Contact with Patient 05/15/14 1604     Chief Complaint  Patient presents with  . Foreign Body    HPI The patient presents to the emergency room with an esophageal food impaction. The patient was eating spareribs this afternoon within the last couple of hours.  Maybe 2 hours ago. The patient felt like the piece of meat got stuck in his throat.  He points to the midsternal region. Since that time he has not been able to swallow the food. He also was not able to drink any liquids. He also does not feel like he can swallow his saliva. He is having to spit frequently. Into an urgent care and was sent down to the emergency room for further treatment.  Patient denies any prior episodes of similar symptoms. He denies any trouble with shortness of breath. No fevers or chills. No abdominal pain. Past Medical History  Diagnosis Date  . Hypertension   . HLD (hyperlipidemia)   . Erectile dysfunction   . Chronic back pain   . CAD (coronary artery disease)   . Carotid artery disease   . PVD (peripheral vascular disease) with claudication   . CVA (cerebrovascular accident) 5/09    Rt sided  . Stroke 01/06/2012  . GERD (gastroesophageal reflux disease)   . Arthritis    Past Surgical History  Procedure Laterality Date  . Eye surgery Left April 2014    Cataract  . Eye surgery Right Jun 16, 2012    Cataract by Dr. Elmer Picker   Family History  Problem Relation Age of Onset  . Lung disease Brother   . Kidney disease Brother   . Heart disease Brother     died at 68  . Heart disease Mother   . Kidney disease Mother   . Lung disease Mother    History  Substance Use Topics  . Smoking status: Former Smoker -- 0.50 packs/day for 40 years    Types: Cigarettes    Quit date: 01/29/2004  . Smokeless tobacco: Never Used  . Alcohol Use: No    Review of Systems  All other systems reviewed and are  negative.     Allergies  Novocain  Home Medications   Prior to Admission medications   Medication Sig Start Date End Date Taking? Authorizing Provider  amLODipine (NORVASC) 10 MG tablet Take 1 tablet (10 mg total) by mouth daily. 02/24/14   Stephanie Coup Street, MD  aspirin 81 MG tablet Take 81 mg by mouth daily.    Historical Provider, MD  atorvastatin (LIPITOR) 40 MG tablet Take 1 tablet (40 mg total) by mouth daily. 03/25/14   Stephanie Coup Street, MD  docusate sodium (COLACE) 250 MG capsule Take 1 capsule (250 mg total) by mouth daily. 10/08/13   Barbaraann Barthel, MD  finasteride (PROSCAR) 5 MG tablet Take 1 tablet (5 mg total) by mouth daily. 04/26/13   Stephanie Coup Street, MD  gabapentin (NEURONTIN) 100 MG capsule Take 100 mg by mouth 3 (three) times daily as needed. Nerve pain.    Historical Provider, MD  gabapentin (NEURONTIN) 100 MG capsule Take 1 capsule (100 mg total) by mouth 3 (three) times daily. 08/02/13   Stephanie Coup Street, MD  HYDROcodone-acetaminophen (NORCO/VICODIN) 5-325 MG per tablet  09/23/13   Historical Provider, MD  meclizine (ANTIVERT) 25 MG tablet Take 1 tablet (25 mg total) by mouth 2 (two) times  daily as needed for dizziness. 04/26/13   Stephanie Coup Street, MD  meclizine (ANTIVERT) 25 MG tablet Take 1 tablet (25 mg total) by mouth every 6 (six) hours as needed for dizziness. 08/20/13   [name], MD  metFORMIN (GLUCOPHAGE) 500 MG tablet Take 1 tablet (500 mg total) by mouth daily. 09/14/13   Myra Rude, MD  nitrofurantoin, macrocrystal-monohydrate, (MACROBID) 100 MG capsule Take 1 capsule (100 mg total) by mouth 2 (two) times daily. 03/30/14   Barbaraann Barthel, MD  omeprazole (PRILOSEC) 20 MG capsule Take 1 capsule (20 mg total) by mouth daily. 10/21/13   Stephanie Coup Street, MD  ondansetron (ZOFRAN ODT) 4 MG disintegrating tablet Take 1 tablet (4 mg total) by mouth every 8 (eight) hours as needed for nausea or vomiting. 03/17/13   Mellody Drown, PA-C  ondansetron  (ZOFRAN ODT) 4 MG disintegrating tablet  ODT q4 hours prn nausea/vomit 10/02/13   Purvis Sheffield, MD  sodium chloride (OCEAN) 0.65 % SOLN nasal spray Place 1 spray into both nostrils as needed for congestion. 04/09/13   Josalyn Funches, MD  tamsulosin (FLOMAX) 0.4 MG CAPS capsule Take 1 capsule (0.4 mg total) by mouth daily. 04/07/14   Stephanie Coup Street, MD  traMADol (ULTRAM) 50 MG tablet Take 1-2 tablets (50-100 mg total) by mouth every 8 (eight) hours as needed for moderate pain. 04/07/14   Stephanie Coup Street, MD   BP 149/93 mmHg  Pulse 59  Temp(Src) 97.3 F (36.3 C)  Resp 18  SpO2 59% Physical Exam  Constitutional: He appears well-developed and well-nourished. No distress.  HENT:  Head: Normocephalic and atraumatic.  Right Ear: External ear normal.  Left Ear: External ear normal.  Mouth/Throat: Mucous membranes are normal. No oral lesions. No trismus in the jaw. No uvula swelling. No oropharyngeal exudate, posterior oropharyngeal edema or posterior oropharyngeal erythema.  Eyes: Conjunctivae are normal. Right eye exhibits no discharge. Left eye exhibits no discharge. No scleral icterus.  Neck: Neck supple. No tracheal deviation present.  Cardiovascular: Normal rate, regular rhythm and intact distal pulses.   Pulmonary/Chest: Effort normal and breath sounds normal. No stridor. No respiratory distress. He has no wheezes. He has no rales.  Abdominal: Soft. Bowel sounds are normal. He exhibits no distension. There is no tenderness. There is no rebound and no guarding.  Musculoskeletal: He exhibits no edema or tenderness.  Neurological: He is alert. He has normal strength. No cranial nerve deficit (no facial droop, extraocular movements intact, no slurred speech) or sensory deficit. He exhibits normal muscle tone. He displays no seizure activity. Coordination normal.  Skin: Skin is warm and dry. No rash noted. He is not diaphoretic.  Psychiatric: He has a normal mood and affect.   Nursing note and vitals reviewed.   ED Course  Procedures (including critical care time) Labs Review Labs Reviewed  CBC WITH DIFFERENTIAL/PLATELET - Abnormal; Notable for the following:    Hemoglobin 12.3 (*)    HCT 37.9 (*)    All other components within normal limits  COMPREHENSIVE METABOLIC PANEL    Imaging Review No results found.   EKG Interpretation   Date/Time:  Sunday May 15 2014 15:55:13 EDT Ventricular Rate:  73 PR Interval:  146 QRS Duration: 86 QT Interval:  394 QTC Calculation: 434 R Axis:   31 Text Interpretation:  Normal sinus rhythm Possible Left atrial enlargement  Borderline ECG nonspecific t wave changes since last tracing Confirmed by  Ziasia Lenoir  MD-J, Shevonne Wolf ([id number]) on 05/15/2014 4:16:12 PM  MDM   Final diagnoses:  Esophageal foreign body, initial encounter    Pt was given a glucagon IV.  Pt attempted to drink some fluid after that.  Immediately had to spit up the fluid.  Will consult with GI for esophageal food impaction.  D/w Dr [name] who will be seeing the patient in th ED.   Linwood Dibbles, MD 05/15/14 [phone]

## 2014-05-15 NOTE — ED Notes (Signed)
GI Doctor speaking with 2 family members.

## 2014-05-15 NOTE — Interval H&P Note (Signed)
History and Physical Interval Note:  05/15/2014 6:20 PM  Brian Burns  has presented today for surgery, with the diagnosis of foreign body removal  The various methods of treatment have been discussed with the patient and family. After consideration of risks, benefits and other options for treatment, the patient has consented to  Procedure(s): ESOPHAGOGASTRODUODENOSCOPY (EGD) (N/A) as a surgical intervention .  The patient's history has been reviewed, patient examined, no change in status, stable for surgery.  I have reviewed the patient's chart and labs.  Questions were answered to the patient's satisfaction.     Eufemia Prindle JR,Sabien Umland L

## 2014-05-17 ENCOUNTER — Encounter (HOSPITAL_COMMUNITY): Payer: Self-pay | Admitting: Gastroenterology

## 2014-06-01 ENCOUNTER — Telehealth: Payer: Self-pay | Admitting: Family Medicine

## 2014-06-01 DIAGNOSIS — T18128A Food in esophagus causing other injury, initial encounter: Secondary | ICD-10-CM | POA: Insufficient documentation

## 2014-06-01 DIAGNOSIS — T18128S Food in esophagus causing other injury, sequela: Secondary | ICD-10-CM

## 2014-06-01 NOTE — Telephone Encounter (Signed)
Pt seen by Dr. [name] a couple of weeks ago for food impaction with EGD in the ED. Will refer back to Dr. [name] for f/u. Thanks. --CMS

## 2014-06-01 NOTE — Telephone Encounter (Signed)
Needs referral to see Dr Carman Ching

## 2014-06-10 ENCOUNTER — Telehealth: Payer: Self-pay | Admitting: Family Medicine

## 2014-06-10 NOTE — Telephone Encounter (Signed)
Authorization through patient insurance is currently suspended [id number]

## 2014-06-10 NOTE — Telephone Encounter (Signed)
Needs referral for pt to get orthotics, will be sending a fax

## 2014-06-10 NOTE — Telephone Encounter (Signed)
Called company fulfilling script. Per them and faxed document, referral needs to be placed to "[facility]," with information provided on the faxed form (Dx codes, equipment codes, etc). Fax provided to [place] for referral coordination. Will place formal referral order in EPIC if needed, but apparently all this can be done via phone. Thanks! --CMS

## 2014-06-10 NOTE — Telephone Encounter (Signed)
FYI to PCP

## 2014-06-21 ENCOUNTER — Telehealth: Payer: Self-pay | Admitting: Family Medicine

## 2014-06-21 DIAGNOSIS — R1310 Dysphagia, unspecified: Secondary | ICD-10-CM | POA: Diagnosis not present

## 2014-06-21 DIAGNOSIS — K219 Gastro-esophageal reflux disease without esophagitis: Secondary | ICD-10-CM | POA: Diagnosis not present

## 2014-06-21 DIAGNOSIS — I714 Abdominal aortic aneurysm, without rupture, unspecified: Secondary | ICD-10-CM

## 2014-06-21 NOTE — Telephone Encounter (Signed)
Need referral sent to Dr.Charles Fields office for appt on 06/30/14.  Appt already scheduled in Epic.

## 2014-06-21 NOTE — Telephone Encounter (Signed)
Sent referral for vascular surgery for AAA as I assume that's what he needs the referral for.   Covering for his PCP  Murtis Sink, MD [facility] Family Medicine Resident, PGY-3 06/21/2014, 12:14 PM

## 2014-06-23 ENCOUNTER — Ambulatory Visit (INDEPENDENT_AMBULATORY_CARE_PROVIDER_SITE_OTHER): Payer: Commercial Managed Care - HMO

## 2014-06-23 DIAGNOSIS — M79676 Pain in unspecified toe(s): Secondary | ICD-10-CM

## 2014-06-23 DIAGNOSIS — M79672 Pain in left foot: Secondary | ICD-10-CM | POA: Diagnosis not present

## 2014-06-23 DIAGNOSIS — I739 Peripheral vascular disease, unspecified: Secondary | ICD-10-CM | POA: Diagnosis not present

## 2014-06-23 DIAGNOSIS — M79671 Pain in right foot: Secondary | ICD-10-CM

## 2014-06-23 DIAGNOSIS — B351 Tinea unguium: Secondary | ICD-10-CM | POA: Diagnosis not present

## 2014-06-23 NOTE — Progress Notes (Signed)
Patient ID: Brian Burns, male   DOB: [birth year], 69 y.o.   MRN: [id number]

## 2014-06-23 NOTE — Progress Notes (Signed)
Patient ID: Brian Burns, male   DOB: [birth year], 69 y.o.   MRN: [id number] @[email or web address] (Error - No data availab@[email or web address] (Error - No data available.)@le .)@

## 2014-06-23 NOTE — Progress Notes (Signed)
Patient ID: Brian Burns, male   DOB: [birth year], 69 y.o.   MRN: [id number] Complaint:  Visit Type: Patient returns to my office for continued preventative foot care services. Complaint: Patient states" my nails have grown long and thick and become painful to walk and wear shoes" Patient has been diagnosed with borderline diabetic with PVD. He presents for preventative foot care services. No changes to ROS  Podiatric Exam: Vascular: dorsalis pedis and posterior tibial pulses are palpable bilateral. Capillary return is immediate. Temperature gradient is WNL. Skin turgor WNL  Sensorium: Normal Semmes Weinstein monofilament test. Normal tactile sensation bilaterally. Nail Exam: Pt has thick disfigured discolored nails with subungual debris noted bilateral entire nail hallux through fifth toenails Ulcer Exam: There is no evidence of ulcer or pre-ulcerative changes or infection. Orthopedic Exam: Muscle tone and strength are WNL. No limitations in general ROM. No crepitus or effusions noted. Foot type and digits show no abnormalities. Bony prominences are unremarkable. Skin: No Porokeratosis. No infection or ulcers  Diagnosis:  Tinea unguium, Pain in right toe, pain in left toes  Treatment & Plan Procedures and Treatment: Consent by patient was obtained for treatment procedures. The patient understood the discussion of treatment and procedures well. All questions were answered thoroughly reviewed. Debridement of mycotic and hypertrophic toenails, 1 through 5 bilateral and clearing of subungual debris. No ulceration, no infection noted.  Return Visit-Office Procedure: Patient instructed to return to the office for a follow up visit 3 months for continued evaluation and treatment.

## 2014-06-23 NOTE — Telephone Encounter (Signed)
Brian Burns obtained #[id number]

## 2014-06-29 DIAGNOSIS — I714 Abdominal aortic aneurysm, without rupture, unspecified: Secondary | ICD-10-CM

## 2014-06-29 HISTORY — DX: Abdominal aortic aneurysm, without rupture, unspecified: I71.40

## 2014-06-29 HISTORY — DX: Abdominal aortic aneurysm, without rupture: I71.4

## 2014-06-30 ENCOUNTER — Ambulatory Visit: Payer: Medicare HMO | Admitting: Vascular Surgery

## 2014-06-30 ENCOUNTER — Encounter (HOSPITAL_COMMUNITY): Payer: Commercial Managed Care - HMO

## 2014-06-30 ENCOUNTER — Other Ambulatory Visit (HOSPITAL_COMMUNITY): Payer: Commercial Managed Care - HMO

## 2014-07-04 ENCOUNTER — Encounter: Payer: Self-pay | Admitting: Vascular Surgery

## 2014-07-06 ENCOUNTER — Other Ambulatory Visit: Payer: Self-pay | Admitting: *Deleted

## 2014-07-06 DIAGNOSIS — M25562 Pain in left knee: Principal | ICD-10-CM

## 2014-07-06 DIAGNOSIS — M25561 Pain in right knee: Secondary | ICD-10-CM

## 2014-07-07 ENCOUNTER — Ambulatory Visit (HOSPITAL_COMMUNITY)
Admission: RE | Admit: 2014-07-07 | Discharge: 2014-07-07 | Disposition: A | Discharge status: Home / Self Care | Payer: Commercial Managed Care - HMO | Source: Ambulatory Visit | Attending: Vascular Surgery | Admitting: Vascular Surgery

## 2014-07-07 ENCOUNTER — Encounter: Payer: Self-pay | Admitting: Vascular Surgery

## 2014-07-07 ENCOUNTER — Ambulatory Visit (INDEPENDENT_AMBULATORY_CARE_PROVIDER_SITE_OTHER): Payer: Commercial Managed Care - HMO | Admitting: Vascular Surgery

## 2014-07-07 VITALS — BP 136/88 | HR 48 | Resp 16 | Ht 76.0 in | Wt 215.0 lb

## 2014-07-07 DIAGNOSIS — I739 Peripheral vascular disease, unspecified: Secondary | ICD-10-CM | POA: Insufficient documentation

## 2014-07-07 DIAGNOSIS — I714 Abdominal aortic aneurysm, without rupture, unspecified: Secondary | ICD-10-CM

## 2014-07-07 MED ORDER — TRAMADOL HCL 50 MG PO TABS: 50.0000 mg | ORAL_TABLET | Freq: Three times a day (TID) | ORAL | Status: DC | PRN

## 2014-07-07 NOTE — Telephone Encounter (Signed)
Red Team: Please call pt to let him know he can pick up a printed tramadol Rx at the front desk at his convenience. Thanks! --CMS

## 2014-07-07 NOTE — Progress Notes (Signed)
[facility] [place] HISTORY AND PHYSICAL   HPI: This is a 69 y.o. male  that we are following for mild peripheral arterial disease as well as abdominal aortic aneurysm. He was last seen approximately one year ago. He was referred to Dr. Shon Baton at that time for back pain. He states his back pain did improve a little bit with an injection. He is following up with Dr. Shon Baton on as-needed basis. He states that he also has some lower back pain that eases a little with laying down, otherwise, nothing alleviates the pain.  He does not smoke and has a remote tobacco history.  He does have hx of CVA x 2 in 2010 and 2013 with no residual. His symptoms are very similar to his previous office visit in June of 2013. He does not describe rest pain. He has no wounds on his feet. Chronic medical problems include hypertension, hyperlipidemia, chronic back pain, coronary disease of which are currently stable.      Past Medical History    Diagnosis   Date    .   Hypertension       .   HLD (hyperlipidemia)       .   Erectile dysfunction       .   Chronic back pain       .   CAD (coronary artery disease)       .   Carotid artery disease       .   PVD (peripheral vascular disease) with claudication       .   CVA (cerebrovascular accident)   5/09          Rt sided    .   Stroke   01/06/2012       Past Surgical History    Procedure   Laterality   Date    .   Eye surgery   Left   April 2014          Cataract    .   Eye surgery   Right   Jun 16, 2012          Cataract by Dr. Elmer Picker        Allergies    Allergen   Reactions    .   Novocain (Procaine Hcl)        Current Outpatient Prescriptions on File Prior to Visit  Medication Sig Dispense Refill  . amLODipine (NORVASC) 10 MG tablet Take 1 tablet (10 mg total) by mouth daily. 90 tablet 1  . aspirin EC 81 MG tablet Take 81 mg by mouth daily.    Marland Kitchen atorvastatin (LIPITOR) 40 MG tablet Take 1 tablet (40 mg total) by mouth daily. 90 tablet 1   . finasteride (PROSCAR) 5 MG tablet Take 1 tablet (5 mg total) by mouth daily. 30 tablet 1  . gabapentin (NEURONTIN) 100 MG capsule Take 1 capsule (100 mg total) by mouth 3 (three) times daily. (Patient not taking: Reported on 05/15/2014) 180 capsule 2  . metFORMIN (GLUCOPHAGE) 500 MG tablet Take 1 tablet (500 mg total) by mouth daily. 180 tablet 3  . nitrofurantoin, macrocrystal-monohydrate, (MACROBID) 100 MG capsule Take 1 capsule (100 mg total) by mouth 2 (two) times daily. 10 capsule 0  . omeprazole (PRILOSEC) 20 MG capsule Take 1 capsule (20 mg total) by mouth daily. 30 capsule 3  . tamsulosin (FLOMAX) 0.4 MG CAPS capsule Take 1 capsule (0.4 mg total) by mouth daily. 30 capsule 3  .  traMADol (ULTRAM) 50 MG tablet Take 1-2 tablets (50-100 mg total) by mouth every 8 (eight) hours as needed for moderate pain. 90 tablet 1   No current facility-administered medications on file prior to visit.      Family History    Problem   Relation   Age of Onset    .   Lung disease   Brother       .   Kidney disease   Brother       .   Heart disease   Brother             died at 79    .   Heart disease   Mother       .   Kidney disease   Mother       .   Lung disease   Mother           History       Social History    .   Marital Status:   Married          Spouse Name:   N/A          Number of Children:   2     .   Years of Education:   N/A       Occupational History    .    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   [employer]             on the weekends       Social History Main Topics    .   Smoking status:   Former Smoker -- 0.50 packs/day for 40 years          Types:   Cigarettes          Quit date:   01/29/2004    .   Smokeless tobacco:   Never Used    .   Alcohol Use:   No    .   Drug Use:   No    .   Sexually Active:   Not on file       Other Topics   Concern    .   Not on file       Social History Narrative    .   No narrative on file       ROS:   Positive    Negative    All sytems reviewed and are negative  General:  Weight loss,  Fever,  chills Neurologic:  Dizziness,  Blackouts,  Seizure [x ] Stroke,  "Mini stroke",  Slurred speech,  Temporary blindness;  weakness in arms or legs,  Hoarseness Cardiac:  Chest pain/pressure,  Shortness of breath at rest [x ] Shortness of breath with exertion,  Atrial fibrillation or irregular heartbeat Vascular: [x ] Pain in legs with walking, [x ] Pain in legs at rest,  Pain in legs at night,   Non-healing ulcer,  Blood clot in vein/DVT,    Pulmonary:  Home oxygen,  Productive cough,  Coughing up blood,  Asthma,   Wheezing Musculoskeletal:   Arthritis, [x ] Low back pain,  Joint pain Hematologic:  Easy Bruising,  Anemia;  Hepatitis Gastrointestinal:   Blood in stool,  Gastroesophageal Reflux/heartburn,  Trouble swallowing Urinary:  chronic Kidney disease,  on HD -  MWF or  TTHS,  Burning with urination,  Difficulty urinating Endocrine:  hx of diabetes,  hx of thyroid disease Skin:  Rashes,  Wounds Psychological:  Anxiety,  Depression    PHYSICAL EXAMINATION:       Filed Vitals:   07/07/14 1342  BP: 136/88  Pulse: 48  Resp: 16  Height:  (1.93 m)  Weight: 97.523 kg (215 lb)   General:  WDWN in NAD Gait: Normal HENT: WNL Pulmonary: normal non-labored breathing , without Rales, rhonchi,  wheezing Cardiac: RRR, without  Murmurs, rubs or gallops Abdomen: soft, NT, pulsatile epigastric mass nontender consistent with 3-4 cm abdominal aortic aneurysm  Skin: no rashes, ulcers noted Vascular Exam/Pulses: + palpable femoral pulses bilaterally, 2+ popliteal pulses   no palpable pedal pulse but foot is warm and well perfused.  Extremities: without ischemic changes, no Gangrene , no cellulitis; no open wounds;  Musculoskeletal: no muscle wasting or atrophy        Neurologic: A&O X 3; Appropriate Affect ; SENSATION: normal; MOTOR FUNCTION:  moving all extremities equally. Speech is fluent/normal   Non-Invasive Vascular Imaging:  ABI's Right:  1.02  Left:  .86, the patient also had an ultrasound of his abdominal aorta. This showed his aneurysm diameter is currently 3.1 cm which is essentially unchanged from 3.3 cm in 2013. I reviewed and interpreted both of these studies.  ASSESSMENT/PLAN: 69 y.o. male with complaints of bilateral thigh pain and lower back pain.  His ABI's are essentially unchanged from his ABI's 3 years ago.  The pain he describes is not consistent with claudication. Small abdominal aortic aneurysm unchanged over the last year   -we will have him f/u in 1 year with duplex of AAA and ABI's.  Fabienne Bruns, MD [facility] [place] Office: [phone] Pager: [phone]

## 2014-07-08 NOTE — Telephone Encounter (Signed)
Patient informed, expressed understanding. 

## 2014-07-15 ENCOUNTER — Telehealth: Payer: Self-pay | Admitting: Family Medicine

## 2014-07-15 NOTE — Telephone Encounter (Signed)
Received Rx and documentation request for DME necessity for back and knee DME. Pt has history of chronic back and knee pain that has been being managed by orthopedics (injections, etc) as well as chronic tramadol use. Pain in back and bilateral knees interferes with daily functioning, including but not limited to standing for long periods (more than several minutes at a time), ambulating, and doing daily housework / errands requiring walking. This documentation is in addition to office notes dated 12-27-2013 and May 02, 2014. Dx codes: M54.5, M25.561, M25.562. Form completed and returned to [facility] with additional documentation.  Bobbye Morton, MD PGY-3, [facility] 07/15/2014, 8:48 AM

## 2014-07-21 ENCOUNTER — Telehealth: Payer: Self-pay | Admitting: Clinical

## 2014-07-21 NOTE — Telephone Encounter (Signed)
CSW returned a call from [place] with [employer] regarding DME that had been ordered for pt. Deanna informed CSW that the progress notes received did not justify medical necessity for a back and knee brace. Deanna also stated that the braces could cost between $1500-$2000 and that the pt could be responsible for 20%. CSW was informed that it would be recommended for pt to request the order from his orthopaedic office who could provide more information pertaining to medical necessity. CSW contacted pt to inform him of the above. Pt requested for the order to be cancelled and stated that he would contact his orthopaedic in the future if he decides to proceed with the braces. CSW also informed pt that because of his Medicaid there is a chance that his braces could be covered, pt verbalized understanding.  Theresia Bough, MSW, LCSW [phone]

## 2014-07-22 ENCOUNTER — Telehealth: Payer: Self-pay | Admitting: Family Medicine

## 2014-07-22 NOTE — Telephone Encounter (Signed)
Pt called and needs a refill on his gabapentin called in. jw

## 2014-07-23 MED ORDER — GABAPENTIN 100 MG PO CAPS: 100.0000 mg | ORAL_CAPSULE | Freq: Three times a day (TID) | ORAL | Status: DC

## 2014-07-23 NOTE — Telephone Encounter (Signed)
Rx sent electronically. Thanks! --CMS 

## 2014-08-13 ENCOUNTER — Emergency Department (HOSPITAL_COMMUNITY)
Admission: EM | Admit: 2014-08-13 | Discharge: 2014-08-13 | Disposition: A | Discharge status: Home / Self Care | Payer: Commercial Managed Care - HMO | Attending: Emergency Medicine | Admitting: Emergency Medicine

## 2014-08-13 ENCOUNTER — Encounter (HOSPITAL_COMMUNITY): Payer: Self-pay | Admitting: Nurse Practitioner

## 2014-08-13 ENCOUNTER — Emergency Department (HOSPITAL_COMMUNITY): Payer: Commercial Managed Care - HMO

## 2014-08-13 DIAGNOSIS — K219 Gastro-esophageal reflux disease without esophagitis: Secondary | ICD-10-CM | POA: Diagnosis not present

## 2014-08-13 DIAGNOSIS — Y998 Other external cause status: Secondary | ICD-10-CM | POA: Insufficient documentation

## 2014-08-13 DIAGNOSIS — Y9389 Activity, other specified: Secondary | ICD-10-CM | POA: Diagnosis not present

## 2014-08-13 DIAGNOSIS — Z87891 Personal history of nicotine dependence: Secondary | ICD-10-CM | POA: Insufficient documentation

## 2014-08-13 DIAGNOSIS — Z7982 Long term (current) use of aspirin: Secondary | ICD-10-CM | POA: Diagnosis not present

## 2014-08-13 DIAGNOSIS — M1990 Unspecified osteoarthritis, unspecified site: Secondary | ICD-10-CM | POA: Diagnosis not present

## 2014-08-13 DIAGNOSIS — Z8673 Personal history of transient ischemic attack (TIA), and cerebral infarction without residual deficits: Secondary | ICD-10-CM | POA: Insufficient documentation

## 2014-08-13 DIAGNOSIS — Z79899 Other long term (current) drug therapy: Secondary | ICD-10-CM | POA: Diagnosis not present

## 2014-08-13 DIAGNOSIS — I10 Essential (primary) hypertension: Secondary | ICD-10-CM | POA: Diagnosis not present

## 2014-08-13 DIAGNOSIS — I2510 Atherosclerotic heart disease of native coronary artery without angina pectoris: Secondary | ICD-10-CM | POA: Insufficient documentation

## 2014-08-13 DIAGNOSIS — G8929 Other chronic pain: Secondary | ICD-10-CM | POA: Diagnosis not present

## 2014-08-13 DIAGNOSIS — Y92096 Garden or yard of other non-institutional residence as the place of occurrence of the external cause: Secondary | ICD-10-CM | POA: Diagnosis not present

## 2014-08-13 DIAGNOSIS — E785 Hyperlipidemia, unspecified: Secondary | ICD-10-CM | POA: Diagnosis not present

## 2014-08-13 DIAGNOSIS — S6991XA Unspecified injury of right wrist, hand and finger(s), initial encounter: Secondary | ICD-10-CM | POA: Diagnosis present

## 2014-08-13 DIAGNOSIS — Z87438 Personal history of other diseases of male genital organs: Secondary | ICD-10-CM | POA: Diagnosis not present

## 2014-08-13 DIAGNOSIS — X58XXXA Exposure to other specified factors, initial encounter: Secondary | ICD-10-CM | POA: Diagnosis not present

## 2014-08-13 DIAGNOSIS — S63501A Unspecified sprain of right wrist, initial encounter: Secondary | ICD-10-CM | POA: Diagnosis not present

## 2014-08-13 MED ORDER — IBUPROFEN 400 MG PO TABS
600.0000 mg | ORAL_TABLET | Freq: Once | ORAL | Status: AC
  Administered 2014-08-13: 600 mg via ORAL
  Filled 2014-08-13: qty 2

## 2014-08-13 MED ORDER — ACETAMINOPHEN 500 MG PO TABS: 500.0000 mg | ORAL_TABLET | Freq: Four times a day (QID) | ORAL | Status: DC | PRN

## 2014-08-13 NOTE — ED Provider Notes (Signed)
CSN: [id number]     Arrival date & time 08/13/14  5329 History  This chart was scribed for Brian Sanders, PA-C, working with Brian Rhine, MD by Chestine Spore, ED Scribe. The patient was seen in room TR06C/TR06C at 9:42 AM.     Chief Complaint  Patient presents with  . Wrist Pain     The history is provided by the patient. No language interpreter was used.    HPI Comments: Brian SALON is a 69 y.o. male with a medical hx of HTN who presents to the Emergency Department complaining of persistent right wrist pain onset 3 days ago. Pt notes that his pain worsened this morning at 1 AM and he believes that he has a sprained wrist. Pt reports that he was working in the yard cutting grass when his symptoms began. Pt denies any issues with his wrist in the past. He states that he has tried OTC pain cream with no relief for his symptoms. He denies color change, wound, rash, joint swelling, and any other symptoms.   Past Medical History  Diagnosis Date  . Hypertension   . HLD (hyperlipidemia)   . Erectile dysfunction   . Chronic back pain   . CAD (coronary artery disease)   . Carotid artery disease   . PVD (peripheral vascular disease) with claudication   . CVA (cerebrovascular accident) 5/09    Rt sided  . Stroke 01/06/2012  . GERD (gastroesophageal reflux disease)   . Arthritis    Past Surgical History  Procedure Laterality Date  . Eye surgery Left April 2014    Cataract  . Eye surgery Right Jun 16, 2012    Cataract by Dr. Elmer Picker  . Esophagogastroduodenoscopy N/A 05/15/2014    Procedure: ESOPHAGOGASTRODUODENOSCOPY (EGD);  Surgeon: Carman Ching, MD;  Location: [facility] ENDOSCOPY;  Service: Endoscopy;  Laterality: N/A;   Family History  Problem Relation Age of Onset  . Lung disease Brother   . Kidney disease Brother   . Heart disease Brother     died at 1  . Heart disease Mother   . Kidney disease Mother   . Lung disease Mother    History  Substance Use Topics  . Smoking  status: Former Smoker -- 0.50 packs/day for 40 years    Types: Cigarettes    Quit date: 01/29/2004  . Smokeless tobacco: Never Used  . Alcohol Use: No    Review of Systems  Musculoskeletal: Positive for arthralgias. Negative for joint swelling.  Skin: Negative for color change and wound.    Allergies  Novocain  Home Medications   Prior to Admission medications   Medication Sig Start Date End Date Taking? Authorizing Provider  acetaminophen (TYLENOL) 500 MG tablet Take 1 tablet (500 mg total) by mouth every 6 (six) hours as needed. 08/13/14   Ladona Mow, PA-C  amLODipine (NORVASC) 10 MG tablet Take 1 tablet (10 mg total) by mouth daily. 02/24/14   Stephanie Coup Street, MD  aspirin EC 81 MG tablet Take 81 mg by mouth daily.    Historical Provider, MD  atorvastatin (LIPITOR) 40 MG tablet Take 1 tablet (40 mg total) by mouth daily. 03/25/14   Stephanie Coup Street, MD  finasteride (PROSCAR) 5 MG tablet Take 1 tablet (5 mg total) by mouth daily. 04/26/13   Stephanie Coup Street, MD  gabapentin (NEURONTIN) 100 MG capsule Take 1 capsule (100 mg total) by mouth 3 (three) times daily. 07/23/14   Stephanie Coup Street, MD  metFORMIN (GLUCOPHAGE) 500 MG  tablet Take 1 tablet (500 mg total) by mouth daily. 09/14/13   Myra Rude, MD  nitrofurantoin, macrocrystal-monohydrate, (MACROBID) 100 MG capsule Take 1 capsule (100 mg total) by mouth 2 (two) times daily. 03/30/14   Barbaraann Barthel, MD  omeprazole (PRILOSEC) 20 MG capsule Take 1 capsule (20 mg total) by mouth daily. 10/21/13   Stephanie Coup Street, MD  tamsulosin (FLOMAX) 0.4 MG CAPS capsule Take 1 capsule (0.4 mg total) by mouth daily. 04/07/14   Stephanie Coup Street, MD  traMADol (ULTRAM) 50 MG tablet Take 1-2 tablets (50-100 mg total) by mouth every 8 (eight) hours as needed for moderate pain. 07/07/14   Stephanie Coup Street, MD   BP 138/93 mmHg  Pulse 56  Temp(Src) 98.2 F (36.8 C) (Oral)  Resp 20  Ht  (1.93 m)  Wt 225 lb (102.059 kg)  BMI  27.40 kg/m2  SpO2 98% Physical Exam  Constitutional: He is oriented to person, place, and time. He appears well-developed and well-nourished. No distress.  HENT:  Head: Normocephalic and atraumatic.  Eyes: EOM are normal.  Neck: Neck supple. No tracheal deviation present.  Cardiovascular: Normal rate.   Pulses:      Radial pulses are 2+ on the right side.  Pulmonary/Chest: Effort normal. No respiratory distress.  Musculoskeletal: Normal range of motion.  Tenderness to ulnar aspect of distal right forearm and wrist. No anatomic snuff box tenderness. Cap refill less than two seconds.  Neurological: He is alert and oriented to person, place, and time. No sensory deficit.  Distal sensation intact  Skin: Skin is warm and dry.  Psychiatric: He has a normal mood and affect. His behavior is normal.  Nursing note and vitals reviewed.   ED Course  Procedures (including critical care time) DIAGNOSTIC STUDIES: Oxygen Saturation is 98% on RA, nl by my interpretation.    COORDINATION OF CARE: 9:44 AM-Discussed treatment plan with pt at bedside and pt agreed to plan.   Labs Review Labs Reviewed - No data to display  Imaging Review Dg Wrist Complete Right  08/13/2014   CLINICAL DATA:  Pt states that he thinks he sprained his right wrist 3 days ago while doing yard work. Pt states that all of the pain is on the ulnar side of the distal forearm.  EXAM: RIGHT WRIST - COMPLETE 3+ VIEW  COMPARISON:  None.  FINDINGS: There is no evidence of fracture or dislocation. There is no evidence of arthropathy or other focal bone abnormality. Soft tissues are unremarkable.  IMPRESSION: Negative.   Electronically Signed   By: Amie Portland M.D.   On: 08/13/2014 09:58     EKG Interpretation None      MDM   Final diagnoses:  Wrist sprain, right, initial encounter    Patient signed symptoms consistent with overuse injury and right wrist versus wrist sprain. Patient neurovascularly intact. Radiographs  unremarkable for acute pathology. Patient placed in wrist sprain, encouraged all with orthopedics or PCP. There is no concern for septic arthritis or gout. Patient hemodynamic stable and in no acute distress. Patient stable for discharge. Return precautions discussed, patient verbalizes understanding and agreement of this plan.  I personally performed the services described in this documentation, which was scribed in my presence. The recorded information has been reviewed and is accurate.  BP 138/93 mmHg  Pulse 56  Temp(Src) 98.2 F (36.8 C) (Oral)  Resp 20  Ht  (1.93 m)  Wt 225 lb (102.059 kg)  BMI 27.40 kg/m2  SpO2 98%  Signed,  Ladona Mow, PA-C 6:19 PM  Patient discussed with Dr. Zadie Burns, M.D.  Ladona Mow, PA-C 08/13/14 1819  Brian Rhine, MD 08/14/14 1101

## 2014-08-13 NOTE — Discharge Instructions (Signed)

## 2014-08-13 NOTE — ED Notes (Signed)
Pt reports onset R wrist pain after working in yard 3 days ago. Pain has persisted since onset. Cms intact. Pt tried OTC pain cream with no relief

## 2014-08-16 DIAGNOSIS — H5212 Myopia, left eye: Secondary | ICD-10-CM | POA: Diagnosis not present

## 2014-08-16 DIAGNOSIS — H40033 Anatomical narrow angle, bilateral: Secondary | ICD-10-CM | POA: Diagnosis not present

## 2014-08-19 DIAGNOSIS — M25531 Pain in right wrist: Secondary | ICD-10-CM | POA: Diagnosis not present

## 2014-08-31 ENCOUNTER — Telehealth: Payer: Self-pay | Admitting: Family Medicine

## 2014-08-31 NOTE — Telephone Encounter (Signed)
Dawn from [facility] calling to get a prior auth for pt to see Dr. Helane Gunther; Diag Code B-35.10. Thanks, [employer], ASA

## 2014-08-31 NOTE — Telephone Encounter (Signed)
Will forward to MD to place referral. Jazmin Hartsell,CMA  

## 2014-09-01 NOTE — Telephone Encounter (Signed)
Patient sees him for podiatry at [facility].  He is already established with them.  Jazmin Hartsell,CMA

## 2014-09-01 NOTE — Telephone Encounter (Signed)
Please find out who Dr Stacie Acres is and for what he would like to see Mr Makris.  Is it a new or an established visit Thanks  LC

## 2014-09-01 NOTE — Telephone Encounter (Signed)
Thanks  Please ok visits and prior Auth  Perhaps next time they should fax the request?  Thanks  LC

## 2014-09-02 NOTE — Telephone Encounter (Signed)
Auth now approved.

## 2014-09-02 NOTE — Telephone Encounter (Signed)
[employer] Auth # [id number] Suspended at the moment but will get approved  09/13/14 thru 03/12/15

## 2014-09-02 NOTE — Telephone Encounter (Signed)
Will forward to referral coordinator to place referral online.  Patient just needs added visits.  Thaddius Manes,CMA

## 2014-09-08 ENCOUNTER — Encounter: Payer: Self-pay | Admitting: Family Medicine

## 2014-09-08 ENCOUNTER — Ambulatory Visit (INDEPENDENT_AMBULATORY_CARE_PROVIDER_SITE_OTHER): Payer: Commercial Managed Care - HMO | Admitting: Family Medicine

## 2014-09-08 VITALS — BP 124/72 | HR 68 | Temp 98.1°F | Wt 213.0 lb

## 2014-09-08 DIAGNOSIS — M25532 Pain in left wrist: Secondary | ICD-10-CM

## 2014-09-08 DIAGNOSIS — M25531 Pain in right wrist: Secondary | ICD-10-CM

## 2014-09-08 LAB — POCT SEDIMENTATION RATE: POCT SED RATE: 20 mm/hr (ref 0–22)

## 2014-09-08 NOTE — Progress Notes (Signed)
Patient ID: Brian Burns, male   DOB: 1945-04-19, 69 y.o.   MRN: [id number]  HPI:  Pt presents for a same day appointment to discuss wrist pain.  States both wrists have been hurting for at least several weeks. Went to [facility] for R wrist pain and got xray. Was referred to [facility] and states they told him it might be gout and recommended he take tylenol. Was seen there 1-2 weeks ago. Tylenol has not helped. Did get his tramadol filled and states this is helping him a lot. Taking about 4 pills per week. Pain is worse in bilat wrists in the morning, better after time. Not worse with use. Hurts in morning for about 15-20 minutes. No fevers. Also has pains in knees and back.   Note pt also brought up having issues with his urination and prostate - recommended he f/u with PCP for this as that is a chronic ongoing issue.  ROS: See HPI  PMFSH: hx AAA, chronic low back pain, BPH, CAD, CVA, GERD, HLD, HTN, peripheral neuropathy  PHYSICAL EXAM: BP 124/72 mmHg  Pulse 68  Temp(Src) 98.1 F (36.7 C) (Oral)  Wt 213 lb (96.616 kg) Gen: NAD, pleasant, cooperative HEENT: NCAT Ext: bilat wrists normal in appearance without effusion, erythema, or warmth, full ROM of bilat wrists. 2+ radial pulses bilaterally. Full grip strength & strength with wrist flexion/extension/ulnar/radial deviation bilaterally. No bony tenderness.  ASSESSMENT/PLAN:  Bilateral wrist pain Benign examination today. Prior R wrist films without degenerative changes noted. Given hx of worse in AM, concern for possible inflammatory process.  Plan: - check labs today: sed rate, CRP, RF - continue tramadol as needed for pain (will not refill as pt had 90 pills filled on 09/03/14 per [place] controlled substance database) - f/u with PCP in a couple of weeks for further eval    FOLLOW UP: F/u in a few weeks with PCP for wrist pain & urinary issues  [place] J. Pollie Meyer, MD [facility]

## 2014-09-08 NOTE — Patient Instructions (Signed)
Checking labwork Continue tramadol for pain Can try icing the wrists too Follow up with Dr. Beryle Flock in a few weeks for wrists and urine issues since we didn't talk about urine today  Be well, Dr. Pollie Meyer

## 2014-09-09 ENCOUNTER — Encounter: Payer: Self-pay | Admitting: Family Medicine

## 2014-09-09 DIAGNOSIS — M25532 Pain in left wrist: Secondary | ICD-10-CM

## 2014-09-09 DIAGNOSIS — M25531 Pain in right wrist: Secondary | ICD-10-CM

## 2014-09-09 HISTORY — DX: Pain in left wrist: M25.532

## 2014-09-09 HISTORY — DX: Pain in right wrist: M25.531

## 2014-09-09 LAB — ANA: Anti Nuclear Antibody(ANA): NEGATIVE

## 2014-09-09 LAB — C-REACTIVE PROTEIN: CRP: 1.1 mg/dL — ABNORMAL HIGH (ref <0.60)

## 2014-09-09 LAB — RHEUMATOID FACTOR: Rhuematoid fact SerPl-aCnc: <10 IU/mL (ref <=14)

## 2014-09-09 NOTE — Assessment & Plan Note (Addendum)
Benign examination today. Prior R wrist films without degenerative changes noted. Given hx of worse in AM, concern for possible inflammatory process.  Plan: - check labs today: sed rate, CRP, RF - continue tramadol as needed for pain (will not refill as pt had 90 pills filled on 09/03/14 per [place] controlled substance database) - f/u with PCP in a couple of weeks for further eval

## 2014-09-13 ENCOUNTER — Encounter: Payer: Self-pay | Admitting: Podiatry

## 2014-09-13 ENCOUNTER — Ambulatory Visit (INDEPENDENT_AMBULATORY_CARE_PROVIDER_SITE_OTHER): Payer: Commercial Managed Care - HMO | Admitting: Podiatry

## 2014-09-13 DIAGNOSIS — M79676 Pain in unspecified toe(s): Secondary | ICD-10-CM | POA: Diagnosis not present

## 2014-09-13 DIAGNOSIS — B351 Tinea unguium: Secondary | ICD-10-CM | POA: Diagnosis not present

## 2014-09-13 DIAGNOSIS — I739 Peripheral vascular disease, unspecified: Secondary | ICD-10-CM

## 2014-09-13 NOTE — Progress Notes (Signed)
Patient ID: Brian Burns, male   DOB: 03/14/1945, 69 y.o.   MRN: [id number] Complaint:  Visit Type: Patient returns to my office for continued preventative foot care services. Complaint: Patient states" my nails have grown long and thick and become painful to walk and wear shoes" Patient has been diagnosed with borderline diabetic with PVD. He presents for preventative foot care services. No changes to ROS  Podiatric Exam: Vascular: dorsalis pedis and posterior tibial pulses are palpable bilateral. Capillary return is immediate. Temperature gradient is WNL. Skin turgor WNL  Sensorium: Normal Semmes Weinstein monofilament test. Normal tactile sensation bilaterally. Nail Exam: Pt has thick disfigured discolored nails with subungual debris noted bilateral entire nail hallux through fifth toenails Ulcer Exam: There is no evidence of ulcer or pre-ulcerative changes or infection. Orthopedic Exam: Muscle tone and strength are WNL. No limitations in general ROM. No crepitus or effusions noted. Foot type and digits show no abnormalities. Bony prominences are unremarkable. Skin: No Porokeratosis. No infection or ulcers  Diagnosis:  Tinea unguium, Pain in right toe, pain in left toes  Treatment & Plan Procedures and Treatment: Consent by patient was obtained for treatment procedures. The patient understood the discussion of treatment and procedures well. All questions were answered thoroughly reviewed. Debridement of mycotic and hypertrophic toenails, 1 through 5 bilateral and clearing of subungual debris. No ulceration, no infection noted.  Return Visit-Office Procedure: Patient instructed to return to the office for a follow up visit 3 months for continued evaluation and treatment. 

## 2014-09-20 ENCOUNTER — Ambulatory Visit (INDEPENDENT_AMBULATORY_CARE_PROVIDER_SITE_OTHER): Payer: Commercial Managed Care - HMO | Admitting: Family Medicine

## 2014-09-20 ENCOUNTER — Encounter: Payer: Self-pay | Admitting: Family Medicine

## 2014-09-20 VITALS — BP 129/91 | HR 74 | Temp 97.6°F | Ht 76.0 in | Wt 209.4 lb

## 2014-09-20 DIAGNOSIS — R319 Hematuria, unspecified: Secondary | ICD-10-CM | POA: Diagnosis not present

## 2014-09-20 DIAGNOSIS — R300 Dysuria: Secondary | ICD-10-CM | POA: Diagnosis not present

## 2014-09-20 LAB — POCT URINALYSIS DIPSTICK
GLUCOSE UA: NEGATIVE
Nitrite, UA: NEGATIVE
Protein, UA: >=300
Urobilinogen, UA: 1
pH, UA: 5.5

## 2014-09-20 MED ORDER — CIPROFLOXACIN HCL 500 MG PO TABS: 500.0000 mg | ORAL_TABLET | Freq: Two times a day (BID) | ORAL | Status: DC

## 2014-09-20 NOTE — Patient Instructions (Addendum)
It was a pleasure seeing you today, Mr Deberg.  Information regarding what we discussed is included in this packet.  I have sent in an antibiotic for you to take two times daily for the next 2 weeks.  I have also placed a referral to urology to investigate your symptoms further.  I have sent your urine for culture.  If your urine grows something that is not covered by this antibiotic, I will call you to change it.  Please make an appointment to see your PCP as needed for routine care.  Please feel free to call our office at [phone] if any questions or concerns arise.  Warm Regards, Jini Horiuchi M. Eugena Rhue, DO  Urinary Tract Infection Urinary tract infections (UTIs) can develop anywhere along your urinary tract. Your urinary tract is your body's drainage system for removing wastes and extra water. Your urinary tract includes two kidneys, two ureters, a bladder, and a urethra. Your kidneys are a pair of bean-shaped organs. Each kidney is about the size of your fist. They are located below your ribs, one on each side of your spine. CAUSES Infections are caused by microbes, which are microscopic organisms, including fungi, viruses, and bacteria. These organisms are so small that they can only be seen through a microscope. Bacteria are the microbes that most commonly cause UTIs. SYMPTOMS  Symptoms of UTIs may vary by age and gender of the patient and by the location of the infection. Symptoms in young women typically include a frequent and intense urge to urinate and a painful, burning feeling in the bladder or urethra during urination. Older women and men are more likely to be tired, shaky, and weak and have muscle aches and abdominal pain. A fever may mean the infection is in your kidneys. Other symptoms of a kidney infection include pain in your back or sides below the ribs, nausea, and vomiting. DIAGNOSIS To diagnose a UTI, your caregiver will ask you about your symptoms. Your caregiver also will ask  to provide a urine sample. The urine sample will be tested for bacteria and white blood cells. White blood cells are made by your body to help fight infection. TREATMENT  Typically, UTIs can be treated with medication. Because most UTIs are caused by a bacterial infection, they usually can be treated with the use of antibiotics. The choice of antibiotic and length of treatment depend on your symptoms and the type of bacteria causing your infection. HOME CARE INSTRUCTIONS  If you were prescribed antibiotics, take them exactly as your caregiver instructs you. Finish the medication even if you feel better after you have only taken some of the medication.  Drink enough water and fluids to keep your urine clear or pale yellow.  Avoid caffeine, tea, and carbonated beverages. They tend to irritate your bladder.  Empty your bladder often. Avoid holding urine for long periods of time.  Empty your bladder before and after sexual intercourse.  After a bowel movement, women should cleanse from front to back. Use each tissue only once. SEEK MEDICAL CARE IF:   You have back pain.  You develop a fever.  Your symptoms do not begin to resolve within 3 days. SEEK IMMEDIATE MEDICAL CARE IF:   You have severe back pain or lower abdominal pain.  You develop chills.  You have nausea or vomiting.  You have continued burning or discomfort with urination. MAKE SURE YOU:   Understand these instructions.  Will watch your condition.  Will get help right away  if you are not doing well or get worse. Document Released: 10/24/2004 Document Revised: 07/16/2011 Document Reviewed: 02/22/2011 [facility] Patient Information 2015 [place], [place]. This information is not intended to replace advice given to you by your health care provider. Make sure you discuss any questions you have with your health care provider.

## 2014-09-20 NOTE — Progress Notes (Signed)
Patient ID: Brian Burns, male   DOB: 1945-07-12, 69 y.o.   MRN: [id number]    Subjective: CC: Urinary symptoms HPI: Patient is a 69 y.o. male presenting to clinic today for same day appointment. Concerns today include:  1. Urinary symptoms Patient reports that symptoms that began this morning.  He reports dysuria.  Denies fevers, chills, nausea, vomiting, hematuria or foul odors.  Patient is not sexually active.  Patient does not recall h/o UTI in past.  He reports urinary dribbling & hesitancy.  Patient is compliant with Flomax and Proscar.  He reports that he has seen a urologist in the past and at that time declined a cystoscopy but now would be open to having it done if his symptoms would cease.  FamHx and MedHx reviewed.  Please see EMR.  ROS: All other systems reviewed and are negative.  Objective: Office vital signs reviewed. BP 129/91 mmHg  Pulse 74  Temp(Src) 97.6 F (36.4 C) (Oral)  Ht 6\' 4"  (1.93 m)  Wt 209 lb 6.4 oz (94.983 kg)  BMI 25.50 kg/m2  Physical Examination:  General: Awake, alert, well nourished, NAD HEENT: Normal, MMM, missing several teeth GI: soft, NT/ND,+BS x4, no hepatomegaly, no splenomegaly GU: minimal suprapubic TTP MSK: Normal gait and station, no CVA TTP Skin: dry, intact, no rashes or lesions Psych: speech somewhat mumbled but responds to questions appropriately  Results for orders placed or performed in visit on 09/20/14 (from the past 24 hour(s))  Urinalysis Dipstick     Status: Abnormal   Collection Time: 09/20/14  4:22 PM  Result Value Ref Range   Color, UA YELLOW    Clarity, UA CLOUDY    Glucose, UA NEG    Bilirubin, UA SMALL    Ketones, UA TRACE    Spec Grav, UA >=1.030    Blood, UA LARGE    pH, UA 5.5    Protein, UA >=300    Urobilinogen, UA 1.0    Nitrite, UA NEG    Leukocytes, UA moderate (2+) (A) Negative   Narrative   QNS - Quantity Not Sufficient for microscopic exam  Urine sent for culture   Assessment/ Plan: 69  y.o. male   1. Dysuria, UA with trace ketones, moderate leukocytes, >300 protein and large blood.  Afebrile and VSS.  No CVA TTP so do not suspect pyelonephritis. Given h/o BPH and recurrent UTI patient rx'd fluoroquinolone (to cover possibility of prostatitis) and referred to urology.  Will likely need cystoscopy, which he is now agreeable to.  April 2016 EKG reviewed and QTc WNL.   - Urinalysis Dipstick - Urine culture obtained.  Will await speciation/sensitivities and change abx if needed. - ciprofloxacin (CIPRO) 500 MG tablet; Take 1 tablet (500 mg total) by mouth 2 (two) times daily.  Dispense: 28 tablet; Refill: 0 - Continue Flomax and Proscar as prescribed.  Would consider increasing Flomax to 0.8mg , but concern for hypotension in geriatric patient. - Ambulatory referral to Urology - Return precautions reviewed. - Patient to return if symptoms worsen/ fail to improve - Precepted with Dr Jennette Kettle  2. Hematuria, concern for continued hematuria.  Concern for malignant causes given duration.  Patient now agreeable to cystoscopy. - Ucx obtained - ciprofloxacin (CIPRO) 500 MG tablet; Take 1 tablet (500 mg total) by mouth 2 (two) times daily.  Dispense: 28 tablet; Refill: 0 - Ambulatory referral to Urology  Raliegh Ip, DO PGY-2, [facility]

## 2014-09-22 ENCOUNTER — Ambulatory Visit: Payer: Commercial Managed Care - HMO | Admitting: Family Medicine

## 2014-09-23 ENCOUNTER — Other Ambulatory Visit: Payer: Self-pay | Admitting: Family Medicine

## 2014-09-23 DIAGNOSIS — B952 Enterococcus as the cause of diseases classified elsewhere: Secondary | ICD-10-CM

## 2014-09-23 DIAGNOSIS — N390 Urinary tract infection, site not specified: Principal | ICD-10-CM

## 2014-09-23 MED ORDER — AMOXICILLIN-POT CLAVULANATE 875-125 MG PO TABS: 1.0000 | ORAL_TABLET | Freq: Two times a day (BID) | ORAL | Status: DC

## 2014-09-23 NOTE — Progress Notes (Signed)
Discussed urine culture results with patient.  Instructed to stop Cipro and start Augmentin for enterococcus UTI.  Patient voiced good understanding.  He is to follow up with PCP next week.  Teresita Fanton M. Nadine Counts, DO PGY-2, [facility]

## 2014-09-24 LAB — URINE CULTURE: Colony Count: 60000

## 2014-09-27 ENCOUNTER — Other Ambulatory Visit: Payer: Self-pay | Admitting: *Deleted

## 2014-09-27 MED ORDER — ATORVASTATIN CALCIUM 40 MG PO TABS: 40.0000 mg | ORAL_TABLET | Freq: Every day | ORAL | Status: DC

## 2014-10-07 ENCOUNTER — Ambulatory Visit (INDEPENDENT_AMBULATORY_CARE_PROVIDER_SITE_OTHER): Payer: Commercial Managed Care - HMO | Admitting: Family Medicine

## 2014-10-07 VITALS — BP 125/75 | HR 43 | Temp 97.7°F | Ht 76.0 in | Wt 217.3 lb

## 2014-10-07 DIAGNOSIS — M25561 Pain in right knee: Secondary | ICD-10-CM

## 2014-10-07 DIAGNOSIS — L570 Actinic keratosis: Secondary | ICD-10-CM | POA: Diagnosis not present

## 2014-10-07 DIAGNOSIS — M25532 Pain in left wrist: Secondary | ICD-10-CM

## 2014-10-07 DIAGNOSIS — M25562 Pain in left knee: Secondary | ICD-10-CM

## 2014-10-07 DIAGNOSIS — I10 Essential (primary) hypertension: Secondary | ICD-10-CM | POA: Diagnosis not present

## 2014-10-07 DIAGNOSIS — M25531 Pain in right wrist: Secondary | ICD-10-CM

## 2014-10-07 DIAGNOSIS — N359 Urethral stricture, unspecified: Secondary | ICD-10-CM | POA: Diagnosis not present

## 2014-10-07 MED ORDER — TRAMADOL HCL 50 MG PO TABS: 50.0000 mg | ORAL_TABLET | Freq: Three times a day (TID) | ORAL | Status: DC | PRN

## 2014-10-07 NOTE — Assessment & Plan Note (Signed)
Lesion consistent with actinic keratosis Offered to freeze off with liquid nitrogen the patient refused Continue to monitor

## 2014-10-07 NOTE — Assessment & Plan Note (Addendum)
Controlled on manual recheck Continue to monitor

## 2014-10-07 NOTE — Patient Instructions (Addendum)
Nice to meet you today. You can take tramadol when your pain is at its worst in your wrists. Otherwise try to keep them moving.  We will watch the spot on your thigh. Let me know if it starts growing, starts bleeding, starts itching, or otherwise changes.  Come back to see me in about a month to follow-up on your cholesterol, diabetes, high blood pressure.  Take care, Dr. B  Actinic Keratosis Actinic keratosis is a precancerous growth on the skin. This means it could develop into skin cancer if it is not treated. About 1% of actinic keratoses turn into skin cancer within a year. It is important to have all such growths removed to prevent them from developing into skin cancer. CAUSES  Actinic keratosis is caused by getting too much ultraviolet (UV) radiation from the sun or other UV light sources. RISK FACTORS Factors that increase your chances of getting actinic keratosis include:  Having light-colored skin and blue eyes.  Having blonde or red hair.  Spending a lot of time in the sun.  Age. The risk of actinic keratosis increases with age. SYMPTOMS  Actinic keratosis growths look like scaly, rough spots of skin. They can be as small as a pinhead or as big as a quarter. They may itch, hurt, or feel sensitive. Sometimes there is a little tag of pink or gray skin growing off them. In some cases, actinic keratoses are easier felt than seen. They do not go away with the use of moisturizing lotions or creams. Actinic keratoses appear most often on areas of skin that get a lot of sun exposure. These areas include the:  Scalp.  Face.  Ears.  Lips.  Upper back.  Backs of the hands.  Forearms. DIAGNOSIS  Your health care provider can usually tell what is wrong by performing a physical exam. A tissue sample (biopsy) may also be taken and examined under a microscope. TREATMENT  Actinic keratosis can be treated several ways. Most treatments can be done in your health care provider's  office. Treatment options may include:  Curettage. A tool is used to gently scrape off the growth.  Cryosurgery. Liquid nitrogen is applied to the growth to freeze it. The growth eventually falls off the skin.  Medicated creams, such as 5-fluorouracil or imiquimod. The medicine destroys the cells in the growth.  Chemical peels. Chemicals are applied to the growth and the outer layers of skin are peeled off.  Photodynamic therapy. A drug that makes your skin more sensitive to light is applied to the skin. A strong, blue light is aimed at the skin and destroys the growth. PREVENTION  To prevent future sun damage:  Try to avoid the sun between 10:00 a.m. and 4:00 p.m. when it is the strongest.  Use a sunscreen or sunblock with SPF 30 or greater.  Apply sunscreen at least 30 minutes before exposure to the sun.  Always wear protective hats, clothing, and sunglasses with UV protection.  Avoid medicines, herbs, and foods that increase your sensitivity to sunlight.  Avoid tanning beds. HOME CARE INSTRUCTIONS   If your skin was covered with a bandage, change and remove the bandage as directed by your health care provider.  Keep the treated area dry as directed by your health care provider.  Apply any creams as prescribed by your health care provider. Follow the directions carefully.  Check your skin regularly for any changes.  Visit a skin doctor (dermatologist) every year for a skin exam. SEEK MEDICAL CARE  IF:   Your skin does not heal and becomes irritated, red, or bleeds.  You notice any changes or new growths on your skin. Document Released: 04/12/2008 Document Revised: 05/31/2013 Document Reviewed: 02/25/2011 [facility] Patient Information 2015 [place], [place]. This information is not intended to replace advice given to you by your health care provider. Make sure you discuss any questions you have with your health care provider.   Wrist Exercises RANGE OF MOTION (ROM) AND  STRETCHING EXERCISES - Wrist Sprain  These exercises may help you when beginning to rehabilitate your injury. Your symptoms may resolve with or without further involvement from your physician, physical therapist, or athletic trainer. While completing these exercises, remember:   Restoring tissue flexibility helps normal motion to return to the joints. This allows healthier, less painful movement and activity.  An effective stretch should be held for at least 30 seconds.  A stretch should never be painful. You should only feel a gentle lengthening or release in the stretched tissue. RANGE OF MOTION - Wrist Flexion, Active-Assisted  Extend your right / left elbow with your palm pointing down.*  Gently pull the back of your hand toward you until you feel a gentle stretch on the top of your forearm.  Hold this position for __________ seconds. Repeat __________ times. Complete this exercise __________ times per day.  *If directed by your physician, physical therapist, or athletic trainer, complete this stretch with your elbow bent rather than extended. RANGE OF MOTION - Wrist Extension, Active-Assisted   Extend your right / left elbow and turn your palm upward.*  Gently pull your palm/fingertips back so your wrist extends and your fingers point more toward the ground.  You should feel a gentle stretch on the inside of your forearm.  Hold this position for __________ seconds. Repeat __________ times. Complete this exercise __________ times per day. *If directed by your physician, physical therapist, or athletic trainer, complete this stretch with your elbow bent, rather than extended. RANGE OF MOTION - Supination, Active   Stand or sit with your elbows at your side. Bend your right / left elbow to 90 degrees.  Turn your palm upward until you feel a gentle stretch on the inside of your forearm.  Hold this position for __________ seconds. Slowly release and return to the starting  position. Repeat __________ times. Complete this stretch __________ times per day.  RANGE OF MOTION - Pronation, Active   Stand or sit with your elbows at your side. Bend your right / left elbow to 90 degrees.  Turn your palm downward until you feel a gentle stretch on the top of your forearm.  Hold this position for __________ seconds. Slowly release and return to the starting position. Repeat __________ times. Complete this stretch __________ times per day.  STRENGTHENING EXERCISES  These exercises may help you when beginning to rehabilitate your injury. They may resolve your symptoms with or without further involvement from your physician, physical therapist, or athletic trainer. While completing these exercises, remember:   Muscles can gain both the endurance and the strength needed for everyday activities through controlled exercises.  Complete these exercises as instructed by your physician, physical therapist, or athletic trainer. Progress the resistance and repetitions only as guided.  You may experience muscle soreness or fatigue, but the pain or discomfort you are trying to eliminate should never worsen during these exercises. If this pain does worsen, stop and make certain you are following the directions exactly. If the pain is still present after  adjustments, discontinue the exercise until you can discuss the trouble with your clinician. STRENGTH - Wrist Flexors  Sit with your right / left forearm palm-up and fully supported. Your elbow should be resting below the height of your shoulder. Allow your wrist to extend over the edge of the surface.  Loosely holding a __________ weight or a piece of rubber exercise band/tubing, slowly curl your hand up toward your forearm.  Hold this position for __________ seconds. Slowly lower the wrist back to the starting position in a controlled manner. Repeat __________ times. Complete this exercise __________ times per day.  STRENGTH - Wrist  Extensors  Sit with your right / left forearm palm-down and fully supported. Your elbow should be resting below the height of your shoulder. Allow your wrist to extend over the edge of the surface.  Loosely holding a __________ weight or a piece of rubber exercise band/tubing, slowly curl your hand up toward your forearm.  Hold this position for __________ seconds. Slowly lower the wrist back to the starting position in a controlled manner. Repeat __________ times. Complete this exercise __________ times per day.  STRENGTH - Forearm Supinators  Sit with your right / left forearm supported on a table, keeping your elbow below shoulder height. Rest your hand over the edge, palm down.  Gently grip a hammer or a soup ladle.  Without moving your elbow, slowly turn your palm and hand upward to a "thumbs-up" position.  Hold this position for __________ seconds. Slowly return to the starting position. Repeat __________ times. Complete this exercise __________ times per day.  STRENGTH - Forearm Pronators   Sit with your right / left forearm supported on a table, keeping your elbow below shoulder height. Rest your hand over the edge, palm up.  Gently grip a hammer or a soup ladle.  Without moving your elbow, slowly turn your palm and hand upward to a "thumbs-up" position.  Hold this position for __________ seconds. Slowly return to the starting position. Repeat __________ times. Complete this exercise __________ times per day.  STRENGTH - Grip  Grasp a tennis ball, a dense sponge, or a large, rolled sock in your hand.  Squeeze as hard as you can without increasing any pain.  Hold this position for __________ seconds. Release your grip slowly. Repeat __________ times. Complete this exercise __________ times per day.  Document Released: 11/28/2004 Document Revised: 05/31/2013 Document Reviewed: 04/28/2008 [facility] Patient Information 2015 [place], [place]. This information is not intended to  replace advice given to you by your health care provider. Make sure you discuss any questions you have with your health care provider.

## 2014-10-07 NOTE — Assessment & Plan Note (Signed)
Likely osteoarthritis RF, ANA, ESR, CRP un remarkable Encouraged exercise Tramadol when necessary

## 2014-10-07 NOTE — Progress Notes (Signed)
   Subjective:   Brian Burns is a 69 y.o. male with a history of HTN, HLD, T2DM, BPH, AAA, PAD here for meeting new PCP and to discuss b/l wrist pain and sore on L thigh.  B/l wrist pain - patient thinks it is arthritis - started in R wrist and then in L wrist - pain 3/10, sharp, intermittent pain - had been prescribed tramadol by Dr. Venetia Maxon previously which helped - tylenol does not help pain - saw [facility] previously and patient reports "he didn't know what it was and told me to take Tylenol" - worse with lifting things - worse in the AM - ongoing for 2-3 months  L thigh scab - has been present for ~15-20 years , not changing - scabs over, he picks if off and then if comes back - non-painful, no previous injury, no other lesions  Review of Systems:  Per HPI. All other systems reviewed and are negative.   PMH, PSH, Medications, Allergies, and FmHx reviewed and updated in EMR.  Social History: former smoker  Objective:  BP 125/75 mmHg  Pulse 43  Temp(Src) 97.7 F (36.5 C) (Oral)  Ht _0  (1.93 m)  Wt 217 lb 4.8 oz (98.567 kg)  BMI 26.46 kg/m2  Gen:  69 y.o. male in NAD HEENT: NCAT, MMM, EOMI, PERRL, anicteric sclerae CV: RRR, no MRG Resp: Non-labored, CTAB, no wheezes noted Ext: WWP, no edema MSK: wrists: Full ROM, strength intact, mild TTP diffusely, no edema or erythema Neuro: Alert and oriented, speech normal Skin: 1cm dark, scaly lesion on L thigh, no surrounding erythema, no induration     Assessment & Plan:     Brian Burns is a 69 y.o. male here for b/l wrist pain, L thigh AK.  HYPERTENSION, BENIGN SYSTEMIC Controlled on manual recheck Continue to monitor  Actinic keratosis Lesion consistent with actinic keratosis Offered to freeze off with liquid nitrogen the patient refused Continue to monitor  Knee pain, bilateral Did not discuss but diagnosis was linked to medication that was ordered  Bilateral wrist pain Likely osteoarthritis RF,  ANA, ESR, CRP un remarkable Encouraged exercise Tramadol when necessary    Virginia Crews, MD MPH PGY-2,  [facility] 10/07/2014  12:29 PM

## 2014-10-07 NOTE — Assessment & Plan Note (Signed)
Did not discuss but diagnosis was linked to medication that was ordered

## 2014-10-11 ENCOUNTER — Encounter: Payer: Self-pay | Admitting: Family Medicine

## 2014-10-11 ENCOUNTER — Other Ambulatory Visit: Payer: Self-pay | Admitting: Urology

## 2014-10-11 ENCOUNTER — Ambulatory Visit (INDEPENDENT_AMBULATORY_CARE_PROVIDER_SITE_OTHER): Payer: Medicare HMO | Admitting: Family Medicine

## 2014-10-11 VITALS — BP 128/82 | HR 65 | Temp 97.7°F | Resp 16 | Ht 76.0 in | Wt 215.0 lb

## 2014-10-11 DIAGNOSIS — S80811A Abrasion, right lower leg, initial encounter: Secondary | ICD-10-CM

## 2014-10-11 NOTE — Patient Instructions (Signed)
Thanks for coming in today.   If you develop any redness, drainage, or worsening pain around the abrasion that you have, then you should come back to see [place]. Otherwise, I expect it to heal up fine on its own.   If you have any other problems don't hesitate to call.   Thanks for letting us take care of you!  Sincerely,  Devota Pace, MD Family Medicine- PGY 2

## 2014-10-11 NOTE — Progress Notes (Signed)
Open sore on rt leg  Stated hit on something about one week ago  Pain scale #2

## 2014-10-12 ENCOUNTER — Telehealth: Payer: Self-pay | Admitting: *Deleted

## 2014-10-12 NOTE — Telephone Encounter (Signed)
Needs refill on meclizine [facility] on [address]

## 2014-10-13 NOTE — Progress Notes (Signed)
Patient ID: Brian Burns, male   DOB: 07-11-1945, 69 y.o.   MRN: [id number]   [facility] Yolande Jolly, MD Phone: [phone]  Subjective:   # Leg Injury  - Pt. Presents for same day appointment with area of abrasion on his right shin.  - He says it happened two days ago, when he dropped a piece of plywood onto his leg and it scraped down the outside.  - He says that it has scabbed over, is not draining, has not been red, and the pain is improving. No swelling, no ecchymosis.  - He says his wife wanted him to come in to get it checked out  - He has DMII and PAD.  - He says that he has been able to bear weight without a problem.  All relevant systems were reviewed and were negative unless otherwise noted in the HPI  Past Medical History Reviewed problem list.  Medications- reviewed and updated Current Outpatient Prescriptions  Medication Sig Dispense Refill  . acetaminophen (TYLENOL) 500 MG tablet Take 1 tablet (500 mg total) by mouth every 6 (six) hours as needed. 30 tablet 0  . amLODipine (NORVASC) 10 MG tablet Take 1 tablet (10 mg total) by mouth daily. 90 tablet 1  . aspirin EC 81 MG tablet Take 81 mg by mouth daily.    Marland Kitchen atorvastatin (LIPITOR) 40 MG tablet Take 1 tablet (40 mg total) by mouth daily. 90 tablet 1  . finasteride (PROSCAR) 5 MG tablet Take 1 tablet (5 mg total) by mouth daily. 30 tablet 1  . gabapentin (NEURONTIN) 100 MG capsule Take 1 capsule (100 mg total) by mouth 3 (three) times daily. 180 capsule 2  . traMADol (ULTRAM) 50 MG tablet Take 1-2 tablets (50-100 mg total) by mouth every 8 (eight) hours as needed for moderate pain. 60 tablet 1  . amoxicillin-clavulanate (AUGMENTIN) 875-125 MG per tablet Take 1 tablet by mouth 2 (two) times daily. x10 days (Patient not taking: Reported on 10/11/2014) 20 tablet 0  . ciprofloxacin (CIPRO) 500 MG tablet Take 1 tablet (500 mg total) by mouth 2 (two) times daily. (Patient not taking: Reported on  10/11/2014) 28 tablet 0  . metFORMIN (GLUCOPHAGE) 500 MG tablet Take 1 tablet (500 mg total) by mouth daily. (Patient not taking: Reported on 10/11/2014) 180 tablet 3  . omeprazole (PRILOSEC) 20 MG capsule Take 1 capsule (20 mg total) by mouth daily. (Patient not taking: Reported on 10/11/2014) 30 capsule 3  . tamsulosin (FLOMAX) 0.4 MG CAPS capsule Take 1 capsule (0.4 mg total) by mouth daily. (Patient not taking: Reported on 10/11/2014) 30 capsule 3   No current facility-administered medications for this visit.   Chief complaint-noted No additions to family history Social history- patient is a non smoker  Objective: BP 128/82 mmHg  Pulse 65  Temp(Src) 97.7 F (36.5 C) (Oral)  Resp 16  Ht  (1.93 m)  Wt 215 lb (97.523 kg)  BMI 26.18 kg/m2 Gen: NAD, alert, cooperative with exam HEENT: NCAT, EOMI, PERRL Neck: FROM, supple CV: RRR, good S1/S2, no murmur, Peripheral pulses 2+  Resp: CTABL, no wheezes, non-labored Abd: SNTND, BS present, no guarding or organomegaly Ext: No edema, warm, normal tone, moves UE/LE spontaneously. Ambulating without difficulty.  Neuro: Alert and oriented, No gross deficits Skin: Small 3x4 cm abrasion on mid- right leg. No evidence of drainage, erythema, or swelling. Mildly tender.   Assessment/Plan:  # Abrasion - Pt. With abrasion on his right leg.  It is not infected. He has DMII, and PAD, but he has good pulses on that leg, and no signes of failed healing.  - Reassurance given - May use tylenol if it is painful.  - It should heal on its own.  - return if signs of infection develop.  - Follow up prn otherwise.

## 2014-10-13 NOTE — Telephone Encounter (Signed)
Pt said Dr Casper Harrison gave him his medication in Feb 2015.  It is for his dizziness.  "if the dr isnt going to fill it, let me know"

## 2014-10-13 NOTE — Telephone Encounter (Signed)
I cannot see in the chart when and what for this patient was prescribed this medication.  Please advise/make sure this is the medication he actually needs a refill on.  Brian Downer, MD, MPH PGY-2,  [facility] 10/13/2014 8:40 AM

## 2014-10-14 ENCOUNTER — Ambulatory Visit (INDEPENDENT_AMBULATORY_CARE_PROVIDER_SITE_OTHER): Payer: Medicare HMO | Admitting: Family Medicine

## 2014-10-14 ENCOUNTER — Encounter: Payer: Self-pay | Admitting: Family Medicine

## 2014-10-14 VITALS — BP 152/82 | HR 63 | Temp 98.1°F | Wt 213.0 lb

## 2014-10-14 DIAGNOSIS — H8110 Benign paroxysmal vertigo, unspecified ear: Secondary | ICD-10-CM | POA: Diagnosis not present

## 2014-10-14 DIAGNOSIS — I10 Essential (primary) hypertension: Secondary | ICD-10-CM

## 2014-10-14 DIAGNOSIS — I2510 Atherosclerotic heart disease of native coronary artery without angina pectoris: Secondary | ICD-10-CM

## 2014-10-14 DIAGNOSIS — R42 Dizziness and giddiness: Secondary | ICD-10-CM

## 2014-10-14 DIAGNOSIS — G459 Transient cerebral ischemic attack, unspecified: Secondary | ICD-10-CM

## 2014-10-14 MED ORDER — MECLIZINE HCL 25 MG PO TABS: 25.0000 mg | ORAL_TABLET | Freq: Three times a day (TID) | ORAL | Status: DC | PRN

## 2014-10-14 NOTE — Progress Notes (Signed)
   Subjective:   Brian Burns is a 69 y.o. male with a history of CAD, CVA, TIA, Carotid artery disease presenting with vertigo  Here for  Chief Complaint  Patient presents with  . Dizziness   #Dizziness preceded by sweating, then room started to spin and he started throwing up. This is a recurrent problem.  Happens intermittently 1-2 times per month. Non-worsening. Reports used meclizine and this helped. Deniess associated CP, SOB. Denies weakness/loss of strength.  No loss of speech or sight  Health Maintenance Due  Topic Date Due  . Hepatitis C Screening  08-Apr-1945  . URINE MICROALBUMIN  02/18/1957  . ZOSTAVAX  02/19/2007  . PNA vac Low Risk Adult (1 of 2 - PCV13) 02/19/2012  . TETANUS/TDAP  05/28/2012  . COLONOSCOPY  10/28/2012  . LIPID PANEL  03/17/2014  . OPHTHALMOLOGY EXAM  07/29/2014  . INFLUENZA VACCINE  08/29/2014  . HEMOGLOBIN A1C  09/16/2014   Review of Systems:   Per HPI. All other systems reviewed and are negative expect as in HPI   PMH, PSH, Medications, Allergies, SocialHx and FHx reviewed and updated in EMR- marked as reviewed 10/14/2014   Objective:  BP 152/82 mmHg  Pulse 63  Temp(Src) 98.1 F (36.7 C) (Oral)  Wt 213 lb (96.616 kg)  Gen:  69 y.o. male in NAD. Speaking in full sentences. Good eye contact HEENT: NCAT, MMM, EOMI, PERRL, anicteric sclerae. Edentalous CV: RRR, no MRG, no JVD Resp: Normal WOB. Non-labored, CTAB, no wheezes noted GI: Soft, NTND, BS present, no guarding or organomegaly Ext: WWP, no edema MSK: Intact gait. Full ROM Neuro: Alert and oriented, speech normal. CN 2-12 were tested in detail.  Pysch: Normal mood and affect.       Chemistry      Component Value Date/Time   NA 135 05/15/2014 1602   K 3.3* 05/15/2014 1602   CL 102 05/15/2014 1602   CO2 26 05/15/2014 1602   BUN 12 05/15/2014 1602   CREATININE 1.48* 05/15/2014 1602   CREATININE 1.30 03/18/2014 1234      Component Value Date/Time   CALCIUM 8.8 05/15/2014  1602   ALKPHOS 93 05/15/2014 1602   AST 23 05/15/2014 1602   ALT 19 05/15/2014 1602   BILITOT 0.5 05/15/2014 1602      Lab Results  Component Value Date   HGBA1C 6.4 03/18/2014   Assessment:     Brian Burns is a 69 y.o. male here for dizziness   Plan:   #Vertigo - likely BPPV but ddx includes CVA/ TIA, Carotid artery obstruction though unlikely in the setting of normal exam. Unlikely MI or cardiac give history, stable exam and lack of chest pain.  - refilled meclizine  Recommended close follow with PCP Return in about 2 weeks (around 10/28/2014), or PCP.  Future Appointments Date Time Provider Department Center  11/11/2014 1:30 PM Erasmo Downer, MD [facility]  12/27/2014 2:00 PM Helane Gunther, DPM [facility]  07/13/2015 8:30 AM [facility]  07/13/2015 9:30 AM [facility]  07/13/2015 10:30 AM Carma Lair Nickel, NP [facility]    Federico Flake, MD, ABFM OB Fellow, Faculty Practice 10/14/2014  2:59 PM

## 2014-10-14 NOTE — Telephone Encounter (Signed)
If patient is still having these symptoms, he should be evaluated in clinic.  There was note in Feb 2015 that he could need vestibular rehab or neurology referral.  Please have patient schedule appt for early next week.  Erasmo Downer, MD, MPH PGY-2,  [facility] 10/14/2014 11:45 AM

## 2014-10-20 ENCOUNTER — Encounter (HOSPITAL_BASED_OUTPATIENT_CLINIC_OR_DEPARTMENT_OTHER): Payer: Self-pay | Admitting: *Deleted

## 2014-10-20 NOTE — Progress Notes (Signed)
To [facility] at 1115-Istat on arrival-instructed Npo after Mn-will take norvasc,lipitor with small amt water in am.States understands.Chart reviewed with Dr Desmond Lope -ok to proceed.

## 2014-10-26 ENCOUNTER — Encounter (HOSPITAL_BASED_OUTPATIENT_CLINIC_OR_DEPARTMENT_OTHER): Payer: Self-pay | Admitting: Anesthesiology

## 2014-10-26 ENCOUNTER — Ambulatory Visit (HOSPITAL_BASED_OUTPATIENT_CLINIC_OR_DEPARTMENT_OTHER): Admission: RE | Admit: 2014-10-26 | Payer: Medicare HMO | Source: Ambulatory Visit | Admitting: Urology

## 2014-10-26 ENCOUNTER — Encounter (HOSPITAL_BASED_OUTPATIENT_CLINIC_OR_DEPARTMENT_OTHER): Payer: Self-pay | Admitting: Urology

## 2014-10-26 HISTORY — DX: Abdominal aortic aneurysm, without rupture: I71.4

## 2014-10-26 SURGERY — CYSTOSCOPY, WITH URETHRAL DILATION
Anesthesia: General

## 2014-10-26 MED ORDER — MIDAZOLAM HCL 2 MG/2ML IJ SOLN
INTRAMUSCULAR | Status: AC
  Filled 2014-10-26: qty 2

## 2014-10-26 MED ORDER — FENTANYL CITRATE (PF) 100 MCG/2ML IJ SOLN
INTRAMUSCULAR | Status: AC
  Filled 2014-10-26: qty 4

## 2014-10-26 NOTE — H&P (Signed)
Reason For Visit   Follow-up for urethral stricture   History of Present Illness   1- gross hematuria. Referred by Dr. Majel Homer July 2014.   -May 2014 patient noticed dark colored/He also developed dysuria. Urine showed 3-6 rbc per hpf, another UA showed trace bacteria, occ RBC, 0-2 wbc.   PSA 0.82, bun 18, cr 1.5 (Cr ranges 1.06 - 1.31).   former smoker.   -Jul 2014 GFR 65 - 75 (Cr 1.17)  -Aug 2014 - CT normal GU tract, anterior cellules in bladder (today)   -Sept 2014 - cysto demonstrated anterior urethral stricture which was not dilated. has refused any further treatment.    BPH/nocturia   He has a history of BPH and has taken tamsulosin in the past for nocturia. Good flow. Frequency, no urgency. Nocturia x 4.   PVR 85 ml   - 03/22/13: I explained to the patient that his symptoms are likely related to his urethral stricture. Continues to complain of nocturia 4. Again, the patient declined any intervention. Denies any fevers or dysuria.    Fatigue  He also feels he has "low T". He has "low energy" and "low drive".   -Jul 2014 T 467    Intv:  Patient seen today for follow-up from PCP visit with extreme dysuria. he was started on Cipro for his symptoms. Has an anterior urethral stricture and history of hematuria. Has not had formal cystoscopy, unable to get past stricture in clinic in Sept 2014. He has refused Urethral dilation in OR. The patient states that his dysuria has improved after completing the antibiotics that he recently was given. He continues to have worsening nocturia. He also has to strain to void. Often times he has urgency and is unable to get urine out at all.  PVR: 156 mL   Past Medical History Problems  1. History of Arthritis 2. History of esophageal reflux (Z87.19) 3. History of glaucoma (Z86.69) 4. History of hypercholesterolemia (Z86.39) 5. History of hypertension (Z86.79) 6. History of Stroke syndrome (I63.9)  Surgical  History Problems  1. History of No Surgical Problems  Current Meds 1. AmLODIPine Besylate 10 MG Oral Tablet;  Therapy: (Recorded:18Jul2014) to Recorded 2. Aspirin Low Dose 81 MG TABS;  Therapy: (Recorded:04Sep2014) to Recorded 3. Atorvastatin Calcium 40 MG Oral Tablet;  Therapy: (Recorded:18Jul2014) to Recorded 4. Finasteride 5 MG Oral Tablet;  Therapy: 20Jan2015 to Recorded 5. Gabapentin 100 MG Oral Capsule;  Therapy: (Recorded:18Jul2014) to Recorded 6. Meclizine HCl - 25 MG Oral Tablet;  Therapy: 18Feb2015 to Recorded 7. Ondansetron 4 MG Oral Tablet Dispersible;  Therapy: 18Feb2015 to Recorded 8. TraMADol HCl - 50 MG Oral Tablet;  Therapy: (Recorded:18Jul2014) to Recorded  Allergies Medication  1. Xylocaine SOLN  Family History Problems  1. Family history of Death In The Family Father   accident 2. Family history of Death In The Family Mother   heart problems 3. Family history of Heart Disease : Mother  Social History Problems  1. Denied: History of Alcohol Use 2. Caffeine Use   1.5 a day 3. Former smoker [phone]) 4. History of tobacco use (Z87.891) 5. Marital History - Currently Married 6. Occupation: Retired  Review of Systems   No constipation, no shortness of breath, no chest pain     Vitals 07 October 2014  Height: 6 ft 4 in  Weight: 225 lb  BMI Calculated: 27.39  BSA Calculated: 2.33  Blood Pressure: 147 / 94  Temperature: 97.4 F  Heart Rate: 55   Results/Data  Selected Results  UA With REFLEX 09Sep2016 02:56PM Berniece Salines  SPECIMEN TYPE: CLEAN CATCH   Test Name Result Flag Reference  COLOR YELLOW  YELLOW  ** PLEASE NOTE CHANGE IN UNIT OF MEASURE AND REFERENCE RANGE(S). **  APPEARANCE CLEAR  CLEAR  SPECIFIC GRAVITY 1.025  1.001-1.035  pH 6.0  5.0-8.0  GLUCOSE NEGATIVE  NEGATIVE  BILIRUBIN NEGATIVE  NEGATIVE  KETONE NEGATIVE  NEGATIVE  BLOOD NEGATIVE  NEGATIVE  PROTEIN NEGATIVE  NEGATIVE  NITRITE NEGATIVE  NEGATIVE   LEUKOCYTE ESTERASE NEGATIVE  NEGATIVE      Patient urine today is normal  Assessment   The patient has progressive lower urinary tract symptoms with a recent urinary tract infection. This is likely secondary to his anterior urethral stricture. However, he also has a history of BPH.   Plan Health Maintenance  1. UA With REFLEX; [Do Not Release]; Status:Complete;   Done: 09Sep2016 02:56PM Microscopic hematuria  2. Changed: From  Tamsulosin HCl - 0.4 MG Oral Capsule  To Tamsulosin HCl - 0.4 MG  Oral Capsule TAKE 1 CAPSULE Daily Nocturia  3. Changed: From  Finasteride 5 MG Oral Tablet  To Finasteride 5 MG Oral Tablet Take 1  tablet daily Urethral stricture  4. PVR U/S; Status:Complete;   Done: 09Sep2016 5. Follow-up Schedule Surgery Office  Follow-up  Status: Complete  Done: 09Sep2016  Discussion/Summary   Our plan is to refill the patient's BPH medications including tamsulosin, and finasteride. In addition, the patient has elected to proceed with urethral dilation under anesthesia. Our plan is to perform a retrograde urethrogram at the time of the procedure and perform a balloon dilation versus DVIU. We will get this scheduled at the patient's convenience.

## 2014-10-26 NOTE — Anesthesia Preprocedure Evaluation (Deleted)
Anesthesia Evaluation  Patient identified by MRN, date of birth, ID band Patient awake    Reviewed: Allergy & Precautions, H&P , NPO status , Patient's Chart, lab work & pertinent test results  History of Anesthesia Complications Negative for: history of anesthetic complications  Airway Mallampati: II  TM Distance: >3 FB Neck ROM: full    Dental no notable dental hx.    Pulmonary neg pulmonary ROS, former smoker,    Pulmonary exam normal breath sounds clear to auscultation       Cardiovascular hypertension, Pt. 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    GI/Hepatic Neg liver ROS, GERD  ,  Endo/Other  negative endocrine ROSdiabetes, Type 2  Renal/GU negative Renal ROS     Musculoskeletal   Abdominal   Peds  Hematology negative hematology ROS (+)   Anesthesia Other Findings   Reproductive/Obstetrics negative OB ROS                             Anesthesia Physical Anesthesia Plan  ASA: III  Anesthesia Plan: General   Post-op Pain Management:    Induction: Intravenous  Airway Management Planned: LMA  Additional Equipment: None  Intra-op Plan:   Post-operative Plan: Extubation in OR  Informed Consent: I have reviewed the patients History and Physical, chart, labs and discussed the procedure including the risks, benefits and alternatives for the proposed anesthesia with the patient or authorized representative who has indicated his/her understanding and acceptance.   Dental Advisory Given  Plan Discussed with: Anesthesiologist, CRNA and Surgeon  Anesthesia Plan Comments:         Anesthesia Quick  Evaluation

## 2014-11-07 ENCOUNTER — Other Ambulatory Visit: Payer: Self-pay | Admitting: Urology

## 2014-11-11 ENCOUNTER — Ambulatory Visit (INDEPENDENT_AMBULATORY_CARE_PROVIDER_SITE_OTHER): Payer: Commercial Managed Care - HMO | Admitting: Family Medicine

## 2014-11-11 ENCOUNTER — Encounter: Payer: Self-pay | Admitting: Family Medicine

## 2014-11-11 VITALS — BP 136/95 | HR 55 | Temp 97.5°F | Ht 76.0 in | Wt 216.2 lb

## 2014-11-11 DIAGNOSIS — M25561 Pain in right knee: Secondary | ICD-10-CM

## 2014-11-11 DIAGNOSIS — M25531 Pain in right wrist: Secondary | ICD-10-CM

## 2014-11-11 DIAGNOSIS — M25532 Pain in left wrist: Secondary | ICD-10-CM | POA: Diagnosis not present

## 2014-11-11 DIAGNOSIS — M25562 Pain in left knee: Secondary | ICD-10-CM | POA: Diagnosis not present

## 2014-11-11 MED ORDER — TRAMADOL HCL 50 MG PO TABS: 50.0000 mg | ORAL_TABLET | Freq: Three times a day (TID) | ORAL | Status: DC | PRN

## 2014-11-11 NOTE — Progress Notes (Signed)
Patient ID: Brian Burns, male   DOB: February 20, 1945, 69 y.o.   MRN: [id number]    Subjective: CC:  Wrist pain HPI: This history was provided by the patient.  Patient is a 69 y.o. male presenting to clinic today for follow-up on bilateral wrist pain and stiffness that is not precipitated by movement.  Patient states when pain is present, it is worse when waking and when lifting heavy objects.  Patient denies numbness and tingling in either wrist, arm or hand.  He rates the pain 2/10 currently and states he takes Tramadol for pain when he is in pain.  Patient states the Tramadol is effective in relieving his pain.    Concerns today include:  1. Wrist pain  Social History Reviewed:  Former smoker. FamHx and MedHx updated.  Please see EMR.  ROS: Per HPI  Objective: Office vital signs reviewed. BP 136/95 mmHg  Pulse 55  Temp(Src) 97.5 F (36.4 C) (Oral)  Ht 6\' 4"  (1.93 m)  Wt 216 lb 3.2 oz (98.068 kg)  BMI 26.33 kg/m2  Physical Examination:  General: Awake, alert, well nourished, NAD Cardio: RRR, S1S2 heard, no murmurs appreciated Pulm: CTAB, no wheezes, rhonchi or rales Extremities: WWP, No edema, cyanosis or clubbing; +2 pulses bilaterally.  No swelling or deformities noted to either wrist.  Full ROM, extension, flexion, side to side.  Radial, ulnar and median nerves intact.   MSK: Normal gait and station Skin: dry, intact, no rashes or lesions Neuro: Strength and sensation grossly intact, gross muscle strength upper extremities intact  Assessment/ Plan: 69 y.o. male  1. Bilateral wrist pain - traMADol (ULTRAM) 50 MG tablet; Take 1-2 tablets (50-100 mg total) by mouth every 8 (eight) hours as needed for moderate pain.  Dispense: 60 tablet; Refill: 1 - Use the exercises outlined in discharge instructions     Nelly Rout, NP Student [facility]

## 2014-11-11 NOTE — Patient Instructions (Signed)
Thank you for allowing [place] to care for you today.  Your wrist pain seems improved since our last visit.  Please continue taking the Tramadol for pain and use the exercises included below.  Call or return to clinic for signs of infection (fever, swelling and redness in joints).  Arthritis Arthritis means joint pain. It can also mean joint disease. A joint is a place where bones come together. People who have arthritis may have:  Red joints.  Swollen joints.  Stiff joints.  Warm joints.  A fever.  A feeling of being sick. HOME CARE Pay attention to any changes in your symptoms. Take these actions to help with your pain and swelling. Medicines  Take over-the-counter and prescription medicines only as told by your doctor.  Do not take aspirin for pain if your doctor says that you may have gout. Activities  Rest your joint if your doctor tells you to.  Avoid activities that make the pain worse.  Exercise your joint regularly as told by your doctor. Try doing exercises like:  Swimming.  Water aerobics.  Biking.  Walking. Joint Care  If your joint is swollen, keep it raised (elevated) if told by your doctor.  If your joint feels stiff in the morning, try taking a warm shower.  If you have diabetes, do not apply heat without asking your doctor.  If told, apply heat to the joint:  Put a towel between the joint and the hot pack or heating pad.  Leave the heat on the area for 20-30 minutes.  If told, apply ice to the joint:  Put ice in a plastic bag.  Place a towel between your skin and the bag.  Leave the ice on for 20 minutes, 2-3 times per day.  Keep all follow-up visits as told by your doctor. GET HELP IF:  The pain gets worse.  You have a fever. GET HELP RIGHT AWAY IF:  You have very bad pain in your joint.  You have swelling in your joint.  Your joint is red.  Many joints become painful and swollen.  You have very bad back pain.  Your leg is  very weak.  You cannot control your pee (urine) or poop (stool).   This information is not intended to replace advice given to you by your health care provider. Make sure you discuss any questions you have with your health care provider.   Document Released: 04/10/2009 Document Revised: 10/05/2014 Document Reviewed: 04/11/2014 Elsevier Interactive Patient Education 2016 [employer]  Generic Wrist Exercises RANGE OF MOTION (ROM) AND STRETCHING EXERCISES - Wrist Sprain  These exercises may help you when beginning to rehabilitate your injury. Your symptoms may resolve with or without further involvement from your physician, physical therapist, or athletic trainer. While completing these exercises, remember:   Restoring tissue flexibility helps normal motion to return to the joints. This allows healthier, less painful movement and activity.  An effective stretch should be held for at least 30 seconds.  A stretch should never be painful. You should only feel a gentle lengthening or release in the stretched tissue. RANGE OF MOTION - Wrist Flexion, Active-Assisted  Extend your right / left elbow with your palm pointing down.*  Gently pull the back of your hand toward you until you feel a gentle stretch on the top of your forearm.  Hold this position for __________ seconds. Repeat __________ times. Complete this exercise __________ times per day.  *If directed by your physician, physical therapist, or athletic  trainer, complete this stretch with your elbow bent rather than extended. RANGE OF MOTION - Wrist Extension, Active-Assisted   Extend your right / left elbow and turn your palm upward.*  Gently pull your palm/fingertips back so your wrist extends and your fingers point more toward the ground.  You should feel a gentle stretch on the inside of your forearm.  Hold this position for __________ seconds. Repeat __________ times. Complete this exercise __________ times per day. *If  directed by your physician, physical therapist, or athletic trainer, complete this stretch with your elbow bent, rather than extended. RANGE OF MOTION - Supination, Active   Stand or sit with your elbows at your side. Bend your right / left elbow to 90 degrees.  Turn your palm upward until you feel a gentle stretch on the inside of your forearm.  Hold this position for __________ seconds. Slowly release and return to the starting position. Repeat __________ times. Complete this stretch __________ times per day.  RANGE OF MOTION - Pronation, Active   Stand or sit with your elbows at your side. Bend your right / left elbow to 90 degrees.  Turn your palm downward until you feel a gentle stretch on the top of your forearm.  Hold this position for __________ seconds. Slowly release and return to the starting position. Repeat __________ times. Complete this stretch __________ times per day.  STRENGTHENING EXERCISES  These exercises may help you when beginning to rehabilitate your injury. They may resolve your symptoms with or without further involvement from your physician, physical therapist, or athletic trainer. While completing these exercises, remember:   Muscles can gain both the endurance and the strength needed for everyday activities through controlled exercises.  Complete these exercises as instructed by your physician, physical therapist, or athletic trainer. Progress the resistance and repetitions only as guided.  You may experience muscle soreness or fatigue, but the pain or discomfort you are trying to eliminate should never worsen during these exercises. If this pain does worsen, stop and make certain you are following the directions exactly. If the pain is still present after adjustments, discontinue the exercise until you can discuss the trouble with your clinician. STRENGTH - Wrist Flexors  Sit with your right / left forearm palm-up and fully supported. Your elbow should be  resting below the height of your shoulder. Allow your wrist to extend over the edge of the surface.  Loosely holding a __________ weight or a piece of rubber exercise band/tubing, slowly curl your hand up toward your forearm.  Hold this position for __________ seconds. Slowly lower the wrist back to the starting position in a controlled manner. Repeat __________ times. Complete this exercise __________ times per day.  STRENGTH - Wrist Extensors  Sit with your right / left forearm palm-down and fully supported. Your elbow should be resting below the height of your shoulder. Allow your wrist to extend over the edge of the surface.  Loosely holding a __________ weight or a piece of rubber exercise band/tubing, slowly curl your hand up toward your forearm.  Hold this position for __________ seconds. Slowly lower the wrist back to the starting position in a controlled manner. Repeat __________ times. Complete this exercise __________ times per day.  STRENGTH - Forearm Supinators  Sit with your right / left forearm supported on a table, keeping your elbow below shoulder height. Rest your hand over the edge, palm down.  Gently grip a hammer or a soup ladle.  Without moving your elbow, slowly  turn your palm and hand upward to a "thumbs-up" position.  Hold this position for __________ seconds. Slowly return to the starting position. Repeat __________ times. Complete this exercise __________ times per day.  STRENGTH - Forearm Pronators   Sit with your right / left forearm supported on a table, keeping your elbow below shoulder height. Rest your hand over the edge, palm up.  Gently grip a hammer or a soup ladle.  Without moving your elbow, slowly turn your palm and hand upward to a "thumbs-up" position.  Hold this position for __________ seconds. Slowly return to the starting position. Repeat __________ times. Complete this exercise __________ times per day.  STRENGTH - Grip  Grasp a tennis  ball, a dense sponge, or a large, rolled sock in your hand.  Squeeze as hard as you can without increasing any pain.  Hold this position for __________ seconds. Release your grip slowly. Repeat __________ times. Complete this exercise __________ times per day.    This information is not intended to replace advice given to you by your health care provider. Make sure you discuss any questions you have with your health care provider.   Document Released: 11/28/2004 Document Revised: 02/04/2014 Document Reviewed: 04/28/2008 Elsevier Interactive Patient Education [employer]

## 2014-11-11 NOTE — Assessment & Plan Note (Signed)
Likely osteoarthritis Rheum w/u unremarkable Encouraged exercises Tramadol prn F/u prn

## 2014-11-18 ENCOUNTER — Encounter (HOSPITAL_BASED_OUTPATIENT_CLINIC_OR_DEPARTMENT_OTHER): Payer: Self-pay | Admitting: *Deleted

## 2014-11-18 NOTE — Progress Notes (Signed)
PREVIOUS HX/ PRE-OP DONE BY SHARON COOK RN ON 10-20-2014. PER HER NOTE PT WAS OK'S TO PROCEED BY DR Desmond Lope [facility].  SURGERY ON 10-26-2014 WAS CANCELLED DUE TO NO TRANSPORTATION.  SPOKE W/ PT WIFE TODAY.  NPO AFTER MN.  ARRIVE AT 0915.  NEEDS ISTAT.  CURRENT EKG IN CHART AND EPIC.  WILL TAKE NORVASC AND LIPITOR AM DOS W/ SIPS OF WATER.

## 2014-11-24 ENCOUNTER — Ambulatory Visit (HOSPITAL_BASED_OUTPATIENT_CLINIC_OR_DEPARTMENT_OTHER)
Admission: RE | Admit: 2014-11-24 | Discharge: 2014-11-24 | Disposition: A | Discharge status: Home / Self Care | Payer: Commercial Managed Care - HMO | Source: Ambulatory Visit | Attending: Urology | Admitting: Urology

## 2014-11-24 ENCOUNTER — Encounter (HOSPITAL_BASED_OUTPATIENT_CLINIC_OR_DEPARTMENT_OTHER): Payer: Self-pay | Admitting: *Deleted

## 2014-11-24 ENCOUNTER — Encounter (HOSPITAL_BASED_OUTPATIENT_CLINIC_OR_DEPARTMENT_OTHER)
Admission: RE | Disposition: A | Discharge status: Home / Self Care | Payer: Self-pay | Source: Ambulatory Visit | Attending: Urology

## 2014-11-24 ENCOUNTER — Ambulatory Visit (HOSPITAL_BASED_OUTPATIENT_CLINIC_OR_DEPARTMENT_OTHER): Payer: Commercial Managed Care - HMO | Admitting: Anesthesiology

## 2014-11-24 DIAGNOSIS — I739 Peripheral vascular disease, unspecified: Secondary | ICD-10-CM | POA: Diagnosis not present

## 2014-11-24 DIAGNOSIS — R3129 Other microscopic hematuria: Secondary | ICD-10-CM | POA: Diagnosis not present

## 2014-11-24 DIAGNOSIS — H409 Unspecified glaucoma: Secondary | ICD-10-CM | POA: Diagnosis not present

## 2014-11-24 DIAGNOSIS — N99113 Postprocedural anterior urethral stricture: Secondary | ICD-10-CM | POA: Diagnosis not present

## 2014-11-24 DIAGNOSIS — K219 Gastro-esophageal reflux disease without esophagitis: Secondary | ICD-10-CM | POA: Insufficient documentation

## 2014-11-24 DIAGNOSIS — E7800 Pure hypercholesterolemia, unspecified: Secondary | ICD-10-CM | POA: Insufficient documentation

## 2014-11-24 DIAGNOSIS — N359 Urethral stricture, unspecified: Secondary | ICD-10-CM | POA: Diagnosis not present

## 2014-11-24 DIAGNOSIS — Z87891 Personal history of nicotine dependence: Secondary | ICD-10-CM | POA: Insufficient documentation

## 2014-11-24 DIAGNOSIS — Z8673 Personal history of transient ischemic attack (TIA), and cerebral infarction without residual deficits: Secondary | ICD-10-CM | POA: Insufficient documentation

## 2014-11-24 DIAGNOSIS — Z79899 Other long term (current) drug therapy: Secondary | ICD-10-CM | POA: Insufficient documentation

## 2014-11-24 DIAGNOSIS — Z7982 Long term (current) use of aspirin: Secondary | ICD-10-CM | POA: Diagnosis not present

## 2014-11-24 DIAGNOSIS — E118 Type 2 diabetes mellitus with unspecified complications: Secondary | ICD-10-CM | POA: Diagnosis not present

## 2014-11-24 DIAGNOSIS — I10 Essential (primary) hypertension: Secondary | ICD-10-CM | POA: Insufficient documentation

## 2014-11-24 DIAGNOSIS — M25562 Pain in left knee: Secondary | ICD-10-CM

## 2014-11-24 DIAGNOSIS — I714 Abdominal aortic aneurysm, without rupture: Secondary | ICD-10-CM | POA: Insufficient documentation

## 2014-11-24 DIAGNOSIS — E1151 Type 2 diabetes mellitus with diabetic peripheral angiopathy without gangrene: Secondary | ICD-10-CM | POA: Insufficient documentation

## 2014-11-24 DIAGNOSIS — N401 Enlarged prostate with lower urinary tract symptoms: Secondary | ICD-10-CM | POA: Insufficient documentation

## 2014-11-24 DIAGNOSIS — I2510 Atherosclerotic heart disease of native coronary artery without angina pectoris: Secondary | ICD-10-CM | POA: Diagnosis not present

## 2014-11-24 DIAGNOSIS — M25561 Pain in right knee: Secondary | ICD-10-CM

## 2014-11-24 DIAGNOSIS — R351 Nocturia: Secondary | ICD-10-CM | POA: Insufficient documentation

## 2014-11-24 DIAGNOSIS — Z7984 Long term (current) use of oral hypoglycemic drugs: Secondary | ICD-10-CM | POA: Diagnosis not present

## 2014-11-24 DIAGNOSIS — M1990 Unspecified osteoarthritis, unspecified site: Secondary | ICD-10-CM | POA: Diagnosis not present

## 2014-11-24 HISTORY — DX: Type 2 diabetes mellitus without complications: E11.9

## 2014-11-24 HISTORY — DX: Personal history of transient ischemic attack (TIA), and cerebral infarction without residual deficits: Z86.73

## 2014-11-24 HISTORY — PX: CYSTOSCOPY WITH URETHRAL DILATATION: SHX5125

## 2014-11-24 HISTORY — PX: CYSTOSCOPY WITH RETROGRADE URETHROGRAM: SHX6309

## 2014-11-24 LAB — POCT I-STAT, CHEM 8
BUN: 17 mg/dL (ref 6–20)
CHLORIDE: 100 mmol/L — AB (ref 101–111)
Calcium, Ion: 1.29 mmol/L (ref 1.13–1.30)
Creatinine, Ser: 1.3 mg/dL — ABNORMAL HIGH (ref 0.61–1.24)
Glucose, Bld: 116 mg/dL — ABNORMAL HIGH (ref 65–99)
HEMATOCRIT: 38 % — AB (ref 39.0–52.0)
HEMOGLOBIN: 12.9 g/dL — AB (ref 13.0–17.0)
POTASSIUM: 4.1 mmol/L (ref 3.5–5.1)
SODIUM: 140 mmol/L (ref 135–145)
TCO2: 27 mmol/L (ref 0–100)

## 2014-11-24 LAB — GLUCOSE, CAPILLARY: GLUCOSE-CAPILLARY: 98 mg/dL (ref 65–99)

## 2014-11-24 SURGERY — CYSTOSCOPY, WITH URETHRAL DILATION
Anesthesia: General

## 2014-11-24 MED ORDER — DEXAMETHASONE SODIUM PHOSPHATE 4 MG/ML IJ SOLN
INTRAMUSCULAR | Status: DC | PRN
  Administered 2014-11-24: 5 mg via INTRAVENOUS

## 2014-11-24 MED ORDER — FENTANYL CITRATE (PF) 100 MCG/2ML IJ SOLN
INTRAMUSCULAR | Status: DC | PRN
  Administered 2014-11-24: 25 ug via INTRAVENOUS
  Administered 2014-11-24: 50 ug via INTRAVENOUS
  Administered 2014-11-24: 25 ug via INTRAVENOUS

## 2014-11-24 MED ORDER — MIDAZOLAM HCL 2 MG/2ML IJ SOLN
INTRAMUSCULAR | Status: AC
  Filled 2014-11-24: qty 2

## 2014-11-24 MED ORDER — MEPERIDINE HCL 25 MG/ML IJ SOLN
6.2500 mg | INTRAMUSCULAR | Status: DC | PRN
  Filled 2014-11-24: qty 1

## 2014-11-24 MED ORDER — LIDOCAINE HCL 2 % EX GEL
CUTANEOUS | Status: DC | PRN
  Administered 2014-11-24: 1 via URETHRAL

## 2014-11-24 MED ORDER — CIPROFLOXACIN IN D5W 400 MG/200ML IV SOLN
400.0000 mg | INTRAVENOUS | Status: AC
  Administered 2014-11-24: 400 mg via INTRAVENOUS
  Filled 2014-11-24: qty 200

## 2014-11-24 MED ORDER — PHENAZOPYRIDINE HCL 200 MG PO TABS: 200.0000 mg | ORAL_TABLET | Freq: Three times a day (TID) | ORAL | Status: DC | PRN

## 2014-11-24 MED ORDER — IOHEXOL 350 MG/ML SOLN
INTRAVENOUS | Status: DC | PRN
  Administered 2014-11-24: 70 mL via URETHRAL

## 2014-11-24 MED ORDER — FENTANYL CITRATE (PF) 100 MCG/2ML IJ SOLN
INTRAMUSCULAR | Status: AC
  Filled 2014-11-24: qty 4

## 2014-11-24 MED ORDER — BELLADONNA ALKALOIDS-OPIUM 16.2-60 MG RE SUPP
RECTAL | Status: DC | PRN
  Administered 2014-11-24: 1 via RECTAL

## 2014-11-24 MED ORDER — SODIUM CHLORIDE 0.9 % IR SOLN
Status: DC | PRN
  Administered 2014-11-24: 3000 mL

## 2014-11-24 MED ORDER — FENTANYL CITRATE (PF) 100 MCG/2ML IJ SOLN
25.0000 ug | INTRAMUSCULAR | Status: DC | PRN
  Filled 2014-11-24: qty 1

## 2014-11-24 MED ORDER — CIPROFLOXACIN IN D5W 400 MG/200ML IV SOLN
INTRAVENOUS | Status: AC
  Filled 2014-11-24: qty 200

## 2014-11-24 MED ORDER — EPHEDRINE SULFATE 50 MG/ML IJ SOLN
INTRAMUSCULAR | Status: DC | PRN
  Administered 2014-11-24: 10 mg via INTRAVENOUS

## 2014-11-24 MED ORDER — LACTATED RINGERS IV SOLN
INTRAVENOUS | Status: DC
  Administered 2014-11-24: 10:00:00 via INTRAVENOUS
  Filled 2014-11-24: qty 1000

## 2014-11-24 MED ORDER — BELLADONNA ALKALOIDS-OPIUM 16.2-60 MG RE SUPP
RECTAL | Status: AC
  Filled 2014-11-24: qty 1

## 2014-11-24 MED ORDER — TRAMADOL HCL 50 MG PO TABS: 50.0000 mg | ORAL_TABLET | Freq: Three times a day (TID) | ORAL | Status: DC | PRN

## 2014-11-24 MED ORDER — PROPOFOL 10 MG/ML IV BOLUS
INTRAVENOUS | Status: DC | PRN
  Administered 2014-11-24: 200 mg via INTRAVENOUS

## 2014-11-24 MED ORDER — ONDANSETRON HCL 4 MG/2ML IJ SOLN
INTRAMUSCULAR | Status: DC | PRN
  Administered 2014-11-24: 4 mg via INTRAVENOUS

## 2014-11-24 MED ORDER — LIDOCAINE HCL (CARDIAC) 20 MG/ML IV SOLN
INTRAVENOUS | Status: DC | PRN
  Administered 2014-11-24: 50 mg via INTRAVENOUS

## 2014-11-24 SURGICAL SUPPLY — 25 items
BAG DRAIN URO-CYSTO SKYTR STRL (DRAIN) ×3 IMPLANT
BAG URINE DRAINAGE (UROLOGICAL SUPPLIES) ×3 IMPLANT
BAG URINE LEG 500ML (DRAIN) IMPLANT
BALLN NEPHROMAX 24X8X12 (CATHETERS) ×3
BALLN NEPHROSTOMY (BALLOONS)
BALLOON NEPHROMAX 24X8X12 (CATHETERS) ×1 IMPLANT
BALLOON NEPHROSTOMY (BALLOONS) IMPLANT
CATH COUDE FOLEY 2W 5CC 18FR (CATHETERS) IMPLANT
CATH FOLEY 2WAY SLVR  5CC 16FR (CATHETERS) ×2
CATH FOLEY 2WAY SLVR  5CC 18FR (CATHETERS) ×2
CATH FOLEY 2WAY SLVR 5CC 16FR (CATHETERS) ×1 IMPLANT
CATH FOLEY 2WAY SLVR 5CC 18FR (CATHETERS) ×1 IMPLANT
CLOTH BEACON ORANGE TIMEOUT ST (SAFETY) ×3 IMPLANT
GLOVE BIO SURGEON STRL SZ7.5 (GLOVE) ×9 IMPLANT
GOWN STRL REUS W/ TWL XL LVL3 (GOWN DISPOSABLE) ×2 IMPLANT
GOWN STRL REUS W/TWL XL LVL3 (GOWN DISPOSABLE) ×4
GUIDEWIRE STR DUAL SENSOR (WIRE) ×3 IMPLANT
HOLDER FOLEY CATH W/STRAP (MISCELLANEOUS) IMPLANT
KIT ROOM TURNOVER WOR (KITS) ×3 IMPLANT
MANIFOLD NEPTUNE II (INSTRUMENTS) ×3 IMPLANT
NS IRRIG 500ML POUR BTL (IV SOLUTION) ×3 IMPLANT
PACK CYSTO (CUSTOM PROCEDURE TRAY) ×3 IMPLANT
TUBE CONNECTING 12'X1/4 (SUCTIONS) ×1
TUBE CONNECTING 12X1/4 (SUCTIONS) ×2 IMPLANT
WATER STERILE IRR 3000ML UROMA (IV SOLUTION) IMPLANT

## 2014-11-24 NOTE — Anesthesia Preprocedure Evaluation (Signed)
Anesthesia Evaluation  Patient identified by MRN, date of birth, ID band Patient awake    Reviewed: Allergy & Precautions, H&P , NPO status , Patient's Chart, lab work & pertinent test results  History of Anesthesia Complications Negative for: history of anesthetic complications  Airway Mallampati: II  TM Distance: >3 FB Neck ROM: full    Dental no notable dental hx. (+) Edentulous Upper, Edentulous Lower   Pulmonary neg pulmonary ROS, former smoker,    Pulmonary exam normal breath sounds clear to auscultation       Cardiovascular hypertension, Pt. 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, Residual Symptoms    GI/Hepatic Neg liver ROS, GERD  ,  Endo/Other  diabetes, Type 2, Oral Hypoglycemic Agents  Renal/GU negative Renal ROS     Musculoskeletal   Abdominal   Peds  Hematology negative hematology ROS (+)   Anesthesia Other Findings   Reproductive/Obstetrics negative OB ROS                             Anesthesia Physical  Anesthesia Plan  ASA: III  Anesthesia Plan: General   Post-op Pain Management:    Induction: Intravenous  Airway Management Planned: LMA  Additional Equipment: None  Intra-op Plan:   Post-operative Plan: Extubation in OR  Informed Consent: I have reviewed the patients History and Physical, chart, labs and discussed the procedure including the risks, benefits and alternatives for the proposed anesthesia with the patient or authorized representative who has indicated his/her understanding and acceptance.   Dental Advisory Given  Plan Discussed with: Anesthesiologist, CRNA and  Surgeon  Anesthesia Plan Comments:         Anesthesia Quick Evaluation

## 2014-11-24 NOTE — Discharge Instructions (Signed)
DISCHARGE INSTRUCTIONS Urologic Surgery  MEDICATIONS:  1.  Resume all your other meds from home - except do not take any extra narcotic pain meds that you may have at home.  2.  Pyridium is to help with the burning/stinging when you urinate. 4. Tramadol is for  moderate/severe pain, otherwise taking upto 1000mg  every 6 hours of plainTylenol will help treat your pain.  Do not take both at the same time.   ACTIVITY:  1. No strenuous activity x 1week  2. No driving while on narcotic pain medications  3. Drink plenty of water  4. Continue to walk at home - you can still get blood clots when you are at home, so keep active, but don't over do it.  5. May return to work/school tomorrow or when you feel ready   BATHING:  1. You can shower and we recommend daily showers    SIGNS/SYMPTOMS TO CALL:  Please call us if you have a fever greater than 101.5, uncontrolled nausea/vomiting, uncontrolled pain, dizziness, unable to urinate, bloody urine, chest pain, shortness of breath, leg swelling, leg pain, redness around wound, drainage from wound, or any other concerns or questions.   You can reach [place] at [phone].   FOLLOW-UP:  1. We will contact you for a voiding trial, catheter removal, on Monday 11/28/14.   Foley Catheter Care A soft, flexible tube (Foley catheter) may have been placed in your bladder to drain urine and fluid. Follow these instructions: Taking Care of the Catheter  Keep the area where the catheter leaves your body clean.   Attach the catheter to the leg so there is no tension on the catheter.   Keep the drainage bag below the level of the bladder, but keep it OFF the floor.   Do not take long soaking baths. Your caregiver will give instructions about showering.   Wash your hands before touching ANYTHING related to the catheter or bag.   Using mild soap and warm water on a washcloth:   Clean the area closest to the catheter insertion site using a circular motion  around the catheter.   Clean the catheter itself by wiping AWAY from the insertion site for several inches down the tube.   NEVER wipe upward as this could sweep bacteria up into the urethra (tube in your body that normally drains the bladder) and cause infection.   Place a small amount of sterile lubricant at the tip of the penis where the catheter is entering.  Taking Care of the Drainage Bags  Two drainage bags may be taken home: a large overnight drainage bag, and a smaller leg bag which fits underneath clothing.   It is okay to wear the overnight bag at any time, but NEVER wear the smaller leg bag at night.   Keep the drainage bag well below the level of your bladder. This prevents backflow of urine into the bladder and allows the urine to drain freely.   Anchor the tubing to your leg to prevent pulling or tension on the catheter. Use tape or a leg strap provided by the hospital.   Empty the drainage bag when it is 1/2 to 3/4 full. Wash your hands before and after touching the bag.   Periodically check the tubing for kinks to make sure there is no pressure on the tubing which could restrict the flow of urine.  Changing the Drainage Bags  Cleanse both ends of the clean bag with alcohol before changing.   Pinch off  the rubber catheter to avoid urine spillage during the disconnection.   Disconnect the dirty bag and connect the clean one.   Empty the dirty bag carefully to avoid a urine spill.   Attach the new bag to the leg with tape or a leg strap.  Cleaning the Drainage Bags  Whenever a drainage bag is disconnected, it must be cleaned quickly so it is ready for the next use.   Wash the bag in warm, soapy water.   Rinse the bag thoroughly with warm water.   Soak the bag for 30 minutes in a solution of white vinegar and water (1 cup vinegar to 1 quart warm water).   Rinse with warm water.  SEEK MEDICAL CARE IF:   You have chills or night sweats.   You are leaking  around your catheter or have problems with your catheter. It is not uncommon to have sporadic leakage around your catheter as a result of bladder spasms. If the leakage stops, there is not much need for concern. If you are uncertain, call your caregiver.   You develop side effects that you think are coming from your medicines.  SEEK IMMEDIATE MEDICAL CARE IF:   You are suddenly unable to urinate. Check to see if there are any kinks in the drainage tubing that may cause this. If you cannot find any kinks, call your caregiver immediately. This is an emergency.   You develop shortness of breath or chest pains.   Bleeding persists or clots develop in your urine.   You have a fever.   You develop pain in your back or over your lower belly (abdomen).   You develop pain or swelling in your legs.   Any problems you are having get worse rather than better.  MAKE SURE YOU:   Understand these instructions.   Will watch your condition.   Will get help right away if you are not doing well or get worse.    Post Anesthesia Home Care Instructions  Activity: Get plenty of rest for the remainder of the day. A responsible adult should stay with you for 24 hours following the procedure.  For the next 24 hours, DO NOT: -Drive a car -Advertising copywriter -Drink alcoholic beverages -Take any medication unless instructed by your physician -Make any legal decisions or sign important papers.  Meals: Start with liquid foods such as gelatin or soup. Progress to regular foods as tolerated. Avoid greasy, spicy, heavy foods. If nausea and/or vomiting occur, drink only clear liquids until the nausea and/or vomiting subsides. Call your physician if vomiting continues.  Special Instructions/Symptoms: Your throat may feel dry or sore from the anesthesia or the breathing tube placed in your throat during surgery. If this causes discomfort, gargle with warm salt water. The discomfort should disappear within 24  hours.  If you had a scopolamine patch placed behind your ear for the management of post- operative nausea and/or vomiting:  1. The medication in the patch is effective for 72 hours, after which it should be removed.  Wrap patch in a tissue and discard in the trash. Wash hands thoroughly with soap and water. 2. You may remove the patch earlier than 72 hours if you experience unpleasant side effects which may include dry mouth, dizziness or visual disturbances. 3. Avoid touching the patch. Wash your hands with soap and water after contact with the patch.

## 2014-11-24 NOTE — Transfer of Care (Addendum)
  Immediate Anesthesia Transfer of Care Note  Patient: Brian Burns  Procedure(s) Performed: Procedure(s) (LRB): CYSTOSCOPY WITH URETHRAL DILATATION (N/A) CYSTOSCOPY WITH RETROGRADE URETHROGRAM (N/A)  Patient Location: PACU  Anesthesia Type: General  Level of Consciousness: awake, alert  and oriented  Airway & Oxygen Therapy: Patient Spontanous Breathing and Patient connected to face mask oxygen, oral airway in place.  Post-op Assessment: Report given to PACU RN and Post -op Vital signs reviewed and stable  Post vital signs: Reviewed and stable  Complications: No apparent anesthesia complications Last Vitals:  Filed Vitals:   11/24/14 0859  BP: 163/79  Pulse: 47  Temp: 36.5 C  Resp: 16

## 2014-11-24 NOTE — Anesthesia Procedure Notes (Signed)
Procedure Name: LMA Insertion Performed by: Crysta Gulick G Pre-anesthesia Checklist: Patient identified, Emergency Drugs available, Suction available and Patient being monitored Patient Re-evaluated:Patient Re-evaluated prior to inductionOxygen Delivery Method: Circle System Utilized Preoxygenation: Pre-oxygenation with 100% oxygen Intubation Type: IV induction Ventilation: Mask ventilation without difficulty LMA: LMA inserted LMA Size: 5.0 Number of attempts: 1 Airway Equipment and Method: bite block Placement Confirmation: positive ETCO2 Tube secured with: Tape Dental Injury: Teeth and Oropharynx as per pre-operative assessment      

## 2014-11-24 NOTE — H&P (View-Only) (Signed)
Reason For Visit   Follow-up for urethral stricture   History of Present Illness   1- gross hematuria. Referred by Dr. Majel Homer July 2014.   -May 2014 patient noticed dark colored/He also developed dysuria. Urine showed 3-6 rbc per hpf, another UA showed trace bacteria, occ RBC, 0-2 wbc.   PSA 0.82, bun 18, cr 1.5 (Cr ranges 1.06 - 1.31).   former smoker.   -Jul 2014 GFR 65 - 75 (Cr 1.17)  -Aug 2014 - CT normal GU tract, anterior cellules in bladder (today)   -Sept 2014 - cysto demonstrated anterior urethral stricture which was not dilated. has refused any further treatment.    BPH/nocturia   He has a history of BPH and has taken tamsulosin in the past for nocturia. Good flow. Frequency, no urgency. Nocturia x 4.   PVR 85 ml   - 03/22/13: I explained to the patient that his symptoms are likely related to his urethral stricture. Continues to complain of nocturia 4. Again, the patient declined any intervention. Denies any fevers or dysuria.    Fatigue  He also feels he has "low T". He has "low energy" and "low drive".   -Jul 2014 T 467    Intv:  Patient seen today for follow-up from PCP visit with extreme dysuria. he was started on Cipro for his symptoms. Has an anterior urethral stricture and history of hematuria. Has not had formal cystoscopy, unable to get past stricture in clinic in Sept 2014. He has refused Urethral dilation in OR. The patient states that his dysuria has improved after completing the antibiotics that he recently was given. He continues to have worsening nocturia. He also has to strain to void. Often times he has urgency and is unable to get urine out at all.  PVR: 156 mL   Past Medical History Problems  1. History of Arthritis 2. History of esophageal reflux (Z87.19) 3. History of glaucoma (Z86.69) 4. History of hypercholesterolemia (Z86.39) 5. History of hypertension (Z86.79) 6. History of Stroke syndrome (I63.9)  Surgical  History Problems  1. History of No Surgical Problems  Current Meds 1. AmLODIPine Besylate 10 MG Oral Tablet;  Therapy: (Recorded:18Jul2014) to Recorded 2. Aspirin Low Dose 81 MG TABS;  Therapy: (Recorded:04Sep2014) to Recorded 3. Atorvastatin Calcium 40 MG Oral Tablet;  Therapy: (Recorded:18Jul2014) to Recorded 4. Finasteride 5 MG Oral Tablet;  Therapy: 20Jan2015 to Recorded 5. Gabapentin 100 MG Oral Capsule;  Therapy: (Recorded:18Jul2014) to Recorded 6. Meclizine HCl - 25 MG Oral Tablet;  Therapy: 18Feb2015 to Recorded 7. Ondansetron 4 MG Oral Tablet Dispersible;  Therapy: 18Feb2015 to Recorded 8. TraMADol HCl - 50 MG Oral Tablet;  Therapy: (Recorded:18Jul2014) to Recorded  Allergies Medication  1. Xylocaine SOLN  Family History Problems  1. Family history of Death In The Family Father   accident 2. Family history of Death In The Family Mother   heart problems 3. Family history of Heart Disease : Mother  Social History Problems  1. Denied: History of Alcohol Use 2. Caffeine Use   1.5 a day 3. Former smoker [phone]) 4. History of tobacco use (Z87.891) 5. Marital History - Currently Married 6. Occupation: Retired  Review of Systems   No constipation, no shortness of breath, no chest pain     Vitals 07 October 2014  Height: 6 ft 4 in  Weight: 225 lb  BMI Calculated: 27.39  BSA Calculated: 2.33  Blood Pressure: 147 / 94  Temperature: 97.4 F  Heart Rate: 55   Results/Data  Selected Results  UA With REFLEX 09Sep2016 02:56PM Berniece Salines  SPECIMEN TYPE: CLEAN CATCH   Test Name Result Flag Reference  COLOR YELLOW  YELLOW  ** PLEASE NOTE CHANGE IN UNIT OF MEASURE AND REFERENCE RANGE(S). **  APPEARANCE CLEAR  CLEAR  SPECIFIC GRAVITY 1.025  1.001-1.035  pH 6.0  5.0-8.0  GLUCOSE NEGATIVE  NEGATIVE  BILIRUBIN NEGATIVE  NEGATIVE  KETONE NEGATIVE  NEGATIVE  BLOOD NEGATIVE  NEGATIVE  PROTEIN NEGATIVE  NEGATIVE  NITRITE NEGATIVE  NEGATIVE   LEUKOCYTE ESTERASE NEGATIVE  NEGATIVE      Patient urine today is normal  Assessment   The patient has progressive lower urinary tract symptoms with a recent urinary tract infection. This is likely secondary to his anterior urethral stricture. However, he also has a history of BPH.   Plan Health Maintenance  1. UA With REFLEX; [Do Not Release]; Status:Complete;   Done: 09Sep2016 02:56PM Microscopic hematuria  2. Changed: From  Tamsulosin HCl - 0.4 MG Oral Capsule  To Tamsulosin HCl - 0.4 MG  Oral Capsule TAKE 1 CAPSULE Daily Nocturia  3. Changed: From  Finasteride 5 MG Oral Tablet  To Finasteride 5 MG Oral Tablet Take 1  tablet daily Urethral stricture  4. PVR U/S; Status:Complete;   Done: 09Sep2016 5. Follow-up Schedule Surgery Office  Follow-up  Status: Complete  Done: 09Sep2016  Discussion/Summary   Our plan is to refill the patient's BPH medications including tamsulosin, and finasteride. In addition, the patient has elected to proceed with urethral dilation under anesthesia. Our plan is to perform a retrograde urethrogram at the time of the procedure and perform a balloon dilation versus DVIU. We will get this scheduled at the patient's convenience.

## 2014-11-24 NOTE — Interval H&P Note (Signed)
History and Physical Interval Note: Patient rescheduled last procedure because of ride issues.  He has had no changes to his H&P since last seen. 11/24/2014 5:16 AM  Brian Burns  has presented today for surgery, with the diagnosis of URETHRAL STRICTURE  The various methods of treatment have been discussed with the patient and family. After consideration of risks, benefits and other options for treatment, the patient has consented to  Procedure(s) with comments: CYSTOSCOPY WITH URETHRAL DILATATION (N/A) - BALLOON DILATION       CYSTOSCOPY WITH RETROGRADE URETHROGRAM (N/A) as a surgical intervention .  The patient's history has been reviewed, patient examined, no change in status, stable for surgery.  I have reviewed the patient's chart and labs.  Questions were answered to the patient's satisfaction.     Berniece Salines W

## 2014-11-24 NOTE — Anesthesia Postprocedure Evaluation (Signed)
  Anesthesia Post-op Note  Patient: Brian Burns  Procedure(s) Performed: Procedure(s) (LRB): CYSTOSCOPY WITH URETHRAL DILATATION (N/A) CYSTOSCOPY WITH RETROGRADE URETHROGRAM (N/A)  Patient Location: PACU  Anesthesia Type: General  Level of Consciousness: awake and alert   Airway and Oxygen Therapy: Patient Spontanous Breathing  Post-op Pain: mild  Post-op Assessment: Post-op Vital signs reviewed, Patient's Cardiovascular Status Stable, Respiratory Function Stable, Patent Airway and No signs of Nausea or vomiting  Last Vitals:  Filed Vitals:   11/24/14 1243  BP: 147/93  Pulse:   Temp:   Resp: 12    Post-op Vital Signs: stable   Complications: No apparent anesthesia complications

## 2014-11-24 NOTE — Op Note (Signed)
Preoperative diagnosis:  1. Anterior urethral stricture 2. Hematuria   Postoperative diagnosis:  1. Same 2.  Procedure: 1. Retrograde urethrogram with interpretation 2. Urethral dilation with a balloon catheter 3. Cystoscopy with retrograde pyelogram bilaterally with interpretations  Surgeon: Ardis Hughs, MD  Anesthesia: General  Complications: None  Intraoperative findings: Using approximate 25 mL of Omnipaque contrast performed a retrograde urethrogram finding a short and soft stricture just distal to the bulbar urethra. This was successfully dilated with a 24 French 12 cm balloon. The cystoscopic evaluation was normal. The retrograde pyelogram were also normal, and demonstrated normal caliber ureters bilaterally with sharp calyces without evidence of hydronephrosis or filling defects. Each collecting system had brisk drainage.  EBL: Minimal  Specimens: None  Indication: Brian Burns is a 69 y.o. patient with  a history of hematuria. His workup was incomplete because of a urethral stricture which we were unable to pass with a flexible cystoscope. After reviewing the management options for treatment, he elected to proceed with the above surgical procedure(s). We have discussed the potential benefits and risks of the procedure, side effects of the proposed treatment, the likelihood of the patient achieving the goals of the procedure, and any potential problems that might occur during the procedure or recuperation. Informed consent has been obtained.  Description of procedure:  The patient was taken to the operating room and general anesthesia was induced.  The patient was placed in the dorsal lithotomy position, prepped and draped in the usual sterile fashion, and preoperative antibiotics were administered. A preoperative time-out was performed.   We then placed several soft pillows under the patient's left flank and obliqued him. I then inserted a 16 French latex catheter  into the fossa navicularis and inflated the balloon with 2 mL of normal saline. I then performed a retrograde urethrogram using 25 mL of Omnipaque contrast with the above findings. I then advanced a wire through the rigid cystoscope scope and into the bladder confirming position with fluoroscopy. I was unable to past the narrowed/stricture area with the rigid scope and as such advanced a 24 French 12 cm balloon dilator over the wire and across the strictured segment. I then inflated the balloon to 14 atm of pressure and held it for approximately 5 minutes prior to taking it down. I then repassed the rigid cystoscope over the wire and was able to cross the newly opened urethral stricture. Once into the bladder I exchanged the 30 lens for a 70 lens and performed a 360 cystoscopic evaluation as described above. I then exchanged again for the 30 lens and performed retrograde pyelograms in the routine fashion with the above findings. I then removed the rigid cystoscope from the patient's bladder under visual guidance noting the area where the urethra had been dilated. There was a mild amount of bleeding with some denuded urothelium. At this point, I opted to place a Foley catheter. An 67 French latex catheter was passed easily without resistance. 10 mL of sterile water was inflated into the balloon. There was clear reflux from the patient's bladder. He subsequent awoken and returned to the PACU in stable condition.  Ardis Hughs, M.D.

## 2014-11-25 ENCOUNTER — Encounter (HOSPITAL_BASED_OUTPATIENT_CLINIC_OR_DEPARTMENT_OTHER): Payer: Self-pay | Admitting: Urology

## 2014-11-28 DIAGNOSIS — N99113 Postprocedural anterior urethral stricture: Secondary | ICD-10-CM | POA: Diagnosis not present

## 2014-12-20 DIAGNOSIS — N99113 Postprocedural anterior urethral stricture: Secondary | ICD-10-CM | POA: Diagnosis not present

## 2014-12-20 DIAGNOSIS — R3121 Asymptomatic microscopic hematuria: Secondary | ICD-10-CM | POA: Diagnosis not present

## 2014-12-20 DIAGNOSIS — N359 Urethral stricture, unspecified: Secondary | ICD-10-CM | POA: Diagnosis not present

## 2014-12-27 ENCOUNTER — Encounter: Payer: Self-pay | Admitting: Podiatry

## 2014-12-27 ENCOUNTER — Ambulatory Visit (INDEPENDENT_AMBULATORY_CARE_PROVIDER_SITE_OTHER): Payer: Commercial Managed Care - HMO | Admitting: Podiatry

## 2014-12-27 DIAGNOSIS — B351 Tinea unguium: Secondary | ICD-10-CM | POA: Diagnosis not present

## 2014-12-27 DIAGNOSIS — M79676 Pain in unspecified toe(s): Secondary | ICD-10-CM | POA: Diagnosis not present

## 2014-12-27 DIAGNOSIS — I739 Peripheral vascular disease, unspecified: Secondary | ICD-10-CM

## 2014-12-27 NOTE — Progress Notes (Signed)
Patient ID: Brian Burns, male   DOB: 20-Mar-1945, 69 y.o.   MRN: [id number] Complaint:  Visit Type: Patient returns to my office for continued preventative foot care services. Complaint: Patient states" my nails have grown long and thick and become painful to walk and wear shoes" Patient has been diagnosed with borderline diabetic with PVD and peripheral neuropathy.Marland Kitchen He presents for preventative foot care services. No changes to ROS  Podiatric Exam: Vascular: dorsalis pedis and posterior tibial pulses are palpable bilateral. Capillary return is immediate. Temperature gradient is WNL. Skin turgor WNL  Sensorium: Normal Semmes Weinstein monofilament test. Normal tactile sensation bilaterally. Nail Exam: Pt has thick disfigured discolored nails with subungual debris noted bilateral entire nail hallux through fifth toenails Ulcer Exam: There is no evidence of ulcer or pre-ulcerative changes or infection. Orthopedic Exam: Muscle tone and strength are WNL. No limitations in general ROM. No crepitus or effusions noted. Foot type and digits show no abnormalities. Bony prominences are unremarkable. Skin: No Porokeratosis. No infection or ulcers  Diagnosis:  Tinea unguium, Pain in right toe, pain in left toes  Treatment & Plan Procedures and Treatment: Consent by patient was obtained for treatment procedures. The patient understood the discussion of treatment and procedures well. All questions were answered thoroughly reviewed. Debridement of mycotic and hypertrophic toenails, 1 through 5 bilateral and clearing of subungual debris. No ulceration, no infection noted.  Return Visit-Office Procedure: Patient instructed to return to the office for a follow up visit 3 months for continued evaluation and treatment.  Helane Gunther DPM

## 2014-12-29 DIAGNOSIS — K8500 Idiopathic acute pancreatitis without necrosis or infection: Secondary | ICD-10-CM

## 2014-12-29 HISTORY — DX: Idiopathic acute pancreatitis without necrosis or infection: K85.00

## 2015-01-02 DIAGNOSIS — R3121 Asymptomatic microscopic hematuria: Secondary | ICD-10-CM | POA: Diagnosis not present

## 2015-01-02 DIAGNOSIS — N99113 Postprocedural anterior urethral stricture: Secondary | ICD-10-CM | POA: Diagnosis not present

## 2015-01-12 DIAGNOSIS — N99113 Postprocedural anterior urethral stricture: Secondary | ICD-10-CM | POA: Diagnosis not present

## 2015-01-14 ENCOUNTER — Emergency Department (HOSPITAL_COMMUNITY): Payer: Commercial Managed Care - HMO

## 2015-01-14 ENCOUNTER — Inpatient Hospital Stay (HOSPITAL_COMMUNITY)
Admission: EM | Admit: 2015-01-14 | Discharge: 2015-01-20 | DRG: 682 | Disposition: A | Discharge status: Home / Self Care | Payer: Commercial Managed Care - HMO | Attending: Family Medicine | Admitting: Family Medicine

## 2015-01-14 DIAGNOSIS — R2689 Other abnormalities of gait and mobility: Secondary | ICD-10-CM | POA: Diagnosis present

## 2015-01-14 DIAGNOSIS — R066 Hiccough: Secondary | ICD-10-CM | POA: Insufficient documentation

## 2015-01-14 DIAGNOSIS — T83511D Infection and inflammatory reaction due to indwelling urethral catheter, subsequent encounter: Secondary | ICD-10-CM | POA: Diagnosis not present

## 2015-01-14 DIAGNOSIS — N189 Chronic kidney disease, unspecified: Secondary | ICD-10-CM

## 2015-01-14 DIAGNOSIS — R001 Bradycardia, unspecified: Secondary | ICD-10-CM | POA: Diagnosis not present

## 2015-01-14 DIAGNOSIS — Z8673 Personal history of transient ischemic attack (TIA), and cerebral infarction without residual deficits: Secondary | ICD-10-CM

## 2015-01-14 DIAGNOSIS — I4892 Unspecified atrial flutter: Secondary | ICD-10-CM | POA: Diagnosis not present

## 2015-01-14 DIAGNOSIS — I120 Hypertensive chronic kidney disease with stage 5 chronic kidney disease or end stage renal disease: Secondary | ICD-10-CM | POA: Diagnosis not present

## 2015-01-14 DIAGNOSIS — I714 Abdominal aortic aneurysm, without rupture: Secondary | ICD-10-CM | POA: Diagnosis not present

## 2015-01-14 DIAGNOSIS — R109 Unspecified abdominal pain: Secondary | ICD-10-CM

## 2015-01-14 DIAGNOSIS — K219 Gastro-esophageal reflux disease without esophagitis: Secondary | ICD-10-CM | POA: Diagnosis present

## 2015-01-14 DIAGNOSIS — I4891 Unspecified atrial fibrillation: Secondary | ICD-10-CM | POA: Diagnosis not present

## 2015-01-14 DIAGNOSIS — I480 Paroxysmal atrial fibrillation: Secondary | ICD-10-CM | POA: Diagnosis not present

## 2015-01-14 DIAGNOSIS — G8929 Other chronic pain: Secondary | ICD-10-CM | POA: Diagnosis not present

## 2015-01-14 DIAGNOSIS — E860 Dehydration: Secondary | ICD-10-CM | POA: Diagnosis not present

## 2015-01-14 DIAGNOSIS — E875 Hyperkalemia: Secondary | ICD-10-CM | POA: Diagnosis not present

## 2015-01-14 DIAGNOSIS — N19 Unspecified kidney failure: Secondary | ICD-10-CM | POA: Diagnosis present

## 2015-01-14 DIAGNOSIS — Z23 Encounter for immunization: Secondary | ICD-10-CM | POA: Diagnosis present

## 2015-01-14 DIAGNOSIS — E785 Hyperlipidemia, unspecified: Secondary | ICD-10-CM | POA: Diagnosis not present

## 2015-01-14 DIAGNOSIS — Z7982 Long term (current) use of aspirin: Secondary | ICD-10-CM | POA: Diagnosis not present

## 2015-01-14 DIAGNOSIS — R1030 Lower abdominal pain, unspecified: Secondary | ICD-10-CM | POA: Diagnosis not present

## 2015-01-14 DIAGNOSIS — E870 Hyperosmolality and hypernatremia: Secondary | ICD-10-CM | POA: Diagnosis not present

## 2015-01-14 DIAGNOSIS — T83511A Infection and inflammatory reaction due to indwelling urethral catheter, initial encounter: Secondary | ICD-10-CM

## 2015-01-14 DIAGNOSIS — I159 Secondary hypertension, unspecified: Secondary | ICD-10-CM | POA: Diagnosis not present

## 2015-01-14 DIAGNOSIS — E440 Moderate protein-calorie malnutrition: Secondary | ICD-10-CM | POA: Insufficient documentation

## 2015-01-14 DIAGNOSIS — E671 Hypercarotinemia: Secondary | ICD-10-CM | POA: Diagnosis not present

## 2015-01-14 DIAGNOSIS — E43 Unspecified severe protein-calorie malnutrition: Secondary | ICD-10-CM | POA: Insufficient documentation

## 2015-01-14 DIAGNOSIS — N320 Bladder-neck obstruction: Secondary | ICD-10-CM | POA: Diagnosis not present

## 2015-01-14 DIAGNOSIS — E1122 Type 2 diabetes mellitus with diabetic chronic kidney disease: Secondary | ICD-10-CM | POA: Diagnosis present

## 2015-01-14 DIAGNOSIS — N179 Acute kidney failure, unspecified: Principal | ICD-10-CM | POA: Diagnosis present

## 2015-01-14 DIAGNOSIS — B958 Unspecified staphylococcus as the cause of diseases classified elsewhere: Secondary | ICD-10-CM | POA: Diagnosis not present

## 2015-01-14 DIAGNOSIS — R1013 Epigastric pain: Secondary | ICD-10-CM | POA: Diagnosis not present

## 2015-01-14 DIAGNOSIS — K859 Acute pancreatitis without necrosis or infection, unspecified: Secondary | ICD-10-CM | POA: Diagnosis present

## 2015-01-14 DIAGNOSIS — E871 Hypo-osmolality and hyponatremia: Secondary | ICD-10-CM | POA: Diagnosis not present

## 2015-01-14 DIAGNOSIS — I2510 Atherosclerotic heart disease of native coronary artery without angina pectoris: Secondary | ICD-10-CM | POA: Diagnosis not present

## 2015-01-14 DIAGNOSIS — Z87891 Personal history of nicotine dependence: Secondary | ICD-10-CM

## 2015-01-14 DIAGNOSIS — E1151 Type 2 diabetes mellitus with diabetic peripheral angiopathy without gangrene: Secondary | ICD-10-CM | POA: Diagnosis not present

## 2015-01-14 DIAGNOSIS — I129 Hypertensive chronic kidney disease with stage 1 through stage 4 chronic kidney disease, or unspecified chronic kidney disease: Secondary | ICD-10-CM | POA: Diagnosis present

## 2015-01-14 DIAGNOSIS — N400 Enlarged prostate without lower urinary tract symptoms: Secondary | ICD-10-CM | POA: Diagnosis not present

## 2015-01-14 DIAGNOSIS — N183 Chronic kidney disease, stage 3 (moderate): Secondary | ICD-10-CM | POA: Diagnosis present

## 2015-01-14 DIAGNOSIS — K8500 Idiopathic acute pancreatitis without necrosis or infection: Secondary | ICD-10-CM | POA: Insufficient documentation

## 2015-01-14 DIAGNOSIS — N390 Urinary tract infection, site not specified: Secondary | ICD-10-CM | POA: Diagnosis present

## 2015-01-14 DIAGNOSIS — R112 Nausea with vomiting, unspecified: Secondary | ICD-10-CM | POA: Diagnosis not present

## 2015-01-14 DIAGNOSIS — R1111 Vomiting without nausea: Secondary | ICD-10-CM | POA: Diagnosis not present

## 2015-01-14 DIAGNOSIS — R1011 Right upper quadrant pain: Secondary | ICD-10-CM | POA: Diagnosis not present

## 2015-01-14 HISTORY — DX: Unspecified atrial flutter: I48.92

## 2015-01-14 LAB — CBC WITH DIFFERENTIAL/PLATELET
Basophils Absolute: 0 10*3/uL (ref 0.0–0.1)
Basophils Relative: 0 %
Eosinophils Absolute: 0 10*3/uL (ref 0.0–0.7)
Eosinophils Relative: 0 %
HEMATOCRIT: 46.7 % (ref 39.0–52.0)
HEMOGLOBIN: 15.1 g/dL (ref 13.0–17.0)
LYMPHS ABS: 1.4 10*3/uL (ref 0.7–4.0)
Lymphocytes Relative: 9 %
MCH: 28 pg (ref 26.0–34.0)
MCHC: 32.3 g/dL (ref 30.0–36.0)
MCV: 86.6 fL (ref 78.0–100.0)
MONO ABS: 0.9 10*3/uL (ref 0.1–1.0)
MONOS PCT: 6 %
NEUTROS ABS: 12.2 10*3/uL — AB (ref 1.7–7.7)
NEUTROS PCT: 85 %
Platelets: 329 10*3/uL (ref 150–400)
RBC: 5.39 MIL/uL (ref 4.22–5.81)
RDW: 15.5 % (ref 11.5–15.5)
WBC: 14.5 10*3/uL — ABNORMAL HIGH (ref 4.0–10.5)

## 2015-01-14 LAB — URINE MICROSCOPIC-ADD ON

## 2015-01-14 LAB — COMPREHENSIVE METABOLIC PANEL
ALK PHOS: 125 U/L (ref 38–126)
ALT: 33 U/L (ref 17–63)
ANION GAP: 14 (ref 5–15)
AST: 29 U/L (ref 15–41)
Albumin: 4.6 g/dL (ref 3.5–5.0)
BILIRUBIN TOTAL: 0.7 mg/dL (ref 0.3–1.2)
BUN: 139 mg/dL — ABNORMAL HIGH (ref 6–20)
CALCIUM: 9.7 mg/dL (ref 8.9–10.3)
CO2: 20 mmol/L — ABNORMAL LOW (ref 22–32)
Chloride: 116 mmol/L — ABNORMAL HIGH (ref 101–111)
Creatinine, Ser: 7.48 mg/dL — ABNORMAL HIGH (ref 0.61–1.24)
GFR, EST AFRICAN AMERICAN: 8 mL/min — AB (ref >60)
GFR, EST NON AFRICAN AMERICAN: 7 mL/min — AB (ref >60)
GLUCOSE: 135 mg/dL — AB (ref 65–99)
Potassium: 6.2 mmol/L (ref 3.5–5.1)
Sodium: 150 mmol/L — ABNORMAL HIGH (ref 135–145)
TOTAL PROTEIN: 9.7 g/dL — AB (ref 6.5–8.1)

## 2015-01-14 LAB — URINALYSIS, ROUTINE W REFLEX MICROSCOPIC
Bilirubin Urine: NEGATIVE
GLUCOSE, UA: NEGATIVE mg/dL
Ketones, ur: NEGATIVE mg/dL
Nitrite: NEGATIVE
PH: 5 (ref 5.0–8.0)
Protein, ur: 100 mg/dL — AB
Specific Gravity, Urine: 1.018 (ref 1.005–1.030)

## 2015-01-14 LAB — I-STAT CG4 LACTIC ACID, ED
Lactic Acid, Venous: 1.53 mmol/L (ref 0.5–2.0)
Lactic Acid, Venous: 1.98 mmol/L (ref 0.5–2.0)

## 2015-01-14 LAB — LIPASE, BLOOD: LIPASE: 172 U/L — AB (ref 11–51)

## 2015-01-14 MED ORDER — INSULIN ASPART 100 UNIT/ML ~~LOC~~ SOLN
5.0000 [IU] | Freq: Once | SUBCUTANEOUS | Status: AC
  Administered 2015-01-14: 5 [IU] via INTRAVENOUS
  Filled 2015-01-14: qty 1

## 2015-01-14 MED ORDER — DEXTROSE 50 % IV SOLN
1.0000 | Freq: Once | INTRAVENOUS | Status: AC
  Administered 2015-01-14: 50 mL via INTRAVENOUS
  Filled 2015-01-14: qty 50

## 2015-01-14 MED ORDER — ONDANSETRON HCL 4 MG/2ML IJ SOLN
4.0000 mg | Freq: Once | INTRAMUSCULAR | Status: AC
  Administered 2015-01-14: 4 mg via INTRAVENOUS
  Filled 2015-01-14: qty 2

## 2015-01-14 MED ORDER — DEXTROSE-NACL 5-0.2 % IV SOLN
INTRAVENOUS | Status: DC
  Administered 2015-01-14: 1000 mL via INTRAVENOUS
  Filled 2015-01-14: qty 1000

## 2015-01-14 MED ORDER — SODIUM CHLORIDE 0.9 % IV BOLUS (SEPSIS)
1000.0000 mL | Freq: Once | INTRAVENOUS | Status: AC
  Administered 2015-01-14: 1000 mL via INTRAVENOUS

## 2015-01-14 MED ORDER — ALBUTEROL SULFATE (2.5 MG/3ML) 0.083% IN NEBU
10.0000 mg | INHALATION_SOLUTION | Freq: Once | RESPIRATORY_TRACT | Status: AC
  Administered 2015-01-14: 10 mg via RESPIRATORY_TRACT
  Filled 2015-01-14: qty 12

## 2015-01-14 MED ORDER — GI COCKTAIL ~~LOC~~
30.0000 mL | Freq: Once | ORAL | Status: AC
  Administered 2015-01-14: 30 mL via ORAL
  Filled 2015-01-14: qty 30

## 2015-01-14 MED ORDER — DEXTROSE 5 % IV SOLN
1.0000 g | Freq: Once | INTRAVENOUS | Status: AC
  Administered 2015-01-14: 1 g via INTRAVENOUS
  Filled 2015-01-14: qty 10

## 2015-01-14 MED ORDER — SODIUM CHLORIDE 0.9 % IV SOLN
Freq: Once | INTRAVENOUS | Status: AC
  Administered 2015-01-14: 21:00:00 via INTRAVENOUS

## 2015-01-14 MED ORDER — SODIUM POLYSTYRENE SULFONATE 15 GM/60ML PO SUSP
30.0000 g | Freq: Once | ORAL | Status: AC
  Administered 2015-01-14: 30 g via ORAL
  Filled 2015-01-14: qty 120

## 2015-01-14 MED ORDER — MORPHINE SULFATE (PF) 4 MG/ML IV SOLN
4.0000 mg | Freq: Once | INTRAVENOUS | Status: AC
  Administered 2015-01-14: 4 mg via INTRAVENOUS
  Filled 2015-01-14: qty 1

## 2015-01-14 NOTE — ED Notes (Signed)
Pt provided urinal & informed need urine specimen.  Both pt & spouse verbalized understanding.

## 2015-01-14 NOTE — ED Provider Notes (Signed)
CSN: [id number]     Arrival date & time 01/14/15  1656 History   First MD Initiated Contact with Patient 01/14/15 1740     Chief Complaint  Patient presents with  . Nausea  . Emesis     (Consider location/radiation/quality/duration/timing/severity/associated sxs/prior Treatment) HPI Brian Burns is a 69 y.o. male who presents to ED with complaint of nausea, vomiting, abdominal pain. Pt is a poor historian, here with his wife who is a poor historian as well. Pt states he has had upper abdominal pain, nausea, vomiting for about 3 days. Vomited twice today, pt is wretching at time of my interview. Wife states pt is unable to keep any water down. He reports sharp abdominal pain. Reports urinary frequency and disuria. Pt states they put a foley in his bladder a week ago, they are not sure why. Foley was removed 2 days ago. Symptoms started shortly after. Pt is able to urinate. No blood in urine reported.   Past Medical History  Diagnosis Date  . Hypertension   . HLD (hyperlipidemia)   . Erectile dysfunction   . Chronic back pain   . CAD (coronary artery disease)   . GERD (gastroesophageal reflux disease)   . Arthritis     knees,lower back  . Carotid artery disease (HCC)   . PVD (peripheral vascular disease) with claudication (HCC)   . AAA (abdominal aortic aneurysm) without rupture (HCC) 06/2014    no change in last exam from 2015  . History of CVA (cerebrovascular accident)     2009  &  2013  . Type 2 diabetes mellitus (HCC)     no meds per pt   Past Surgical History  Procedure Laterality Date  . Esophagogastroduodenoscopy N/A 05/15/2014    Procedure: ESOPHAGOGASTRODUODENOSCOPY (EGD);  Surgeon: Carman Ching, MD;  Location: [facility] ENDOSCOPY;  Service: Endoscopy;  Laterality: N/A;  . Cataract extraction w/ intraocular lens  implant, bilateral  april and may 2014  . Transthoracic echocardiogram  01-07-2012    mild to moderate dilated LV,  ef 45-50%,  mild basal hypokinesis,  grade I  diastolic  dysfunction/  trivial AR/  mild MR  . Cardiovascular stress test  08-09-2009    mild global hypokinesis,  no ischemia or scar/  ef 41%  . Cystoscopy with urethral dilatation N/A 11/24/2014    Procedure: CYSTOSCOPY WITH URETHRAL DILATATION;  Surgeon: Crist Fat, MD;  Location: [facility];  Service: Urology;  Laterality: N/A;  BALLOON DILATION        . Cystoscopy with retrograde urethrogram N/A 11/24/2014    Procedure: CYSTOSCOPY WITH RETROGRADE URETHROGRAM;  Surgeon: Crist Fat, MD;  Location: [facility];  Service: Urology;  Laterality: N/A;   Family History  Problem Relation Age of Onset  . Lung disease Brother   . Kidney disease Brother   . Heart disease Brother     died at 90  . Heart disease Mother   . Kidney disease Mother   . Lung disease Mother    Social History  Substance Use Topics  . Smoking status: Former Smoker -- 0.50 packs/day for 40 years    Types: Cigarettes    Quit date: 01/29/2004  . Smokeless tobacco: Never Used  . Alcohol Use: No    Review of Systems  Constitutional: Negative for fever and chills.  Respiratory: Negative for cough, chest tightness and shortness of breath.   Cardiovascular: Negative for chest pain, palpitations and leg swelling.  Gastrointestinal: Positive for  nausea, vomiting and abdominal pain. Negative for diarrhea, blood in stool and abdominal distention.  Genitourinary: Negative for dysuria, urgency, frequency and hematuria.  Musculoskeletal: Negative for myalgias, arthralgias, neck pain and neck stiffness.  Skin: Negative for rash.  Allergic/Immunologic: Negative for immunocompromised state.  Neurological: Negative for dizziness, weakness, light-headedness, numbness and headaches.      Allergies  Novocain  Home Medications   Prior to Admission medications   Medication Sig Start Date End Date Taking? Authorizing Provider  acetaminophen (TYLENOL) 500 MG tablet Take 1  tablet (500 mg total) by mouth every 6 (six) hours as needed. 08/13/14   Ladona Mow, PA-C  amLODipine (NORVASC) 10 MG tablet Take 1 tablet (10 mg total) by mouth daily. 02/24/14   Stephanie Coup Street, MD  aspirin EC 81 MG tablet Take 81 mg by mouth daily.    Historical Provider, MD  atorvastatin (LIPITOR) 40 MG tablet Take 1 tablet (40 mg total) by mouth daily. 09/27/14   Erasmo Downer, MD  finasteride (PROSCAR) 5 MG tablet Take 1 tablet (5 mg total) by mouth daily. 04/26/13   Stephanie Coup Street, MD  gabapentin (NEURONTIN) 100 MG capsule Take 1 capsule (100 mg total) by mouth 3 (three) times daily. 07/23/14   Stephanie Coup Street, MD  meclizine (ANTIVERT) 25 MG tablet Take 1 tablet (25 mg total) by mouth 3 (three) times daily as needed for dizziness. 10/14/14   Federico Flake, MD  metFORMIN (GLUCOPHAGE) 500 MG tablet Take 1 tablet (500 mg total) by mouth daily. 09/14/13   Myra Rude, MD  omeprazole (PRILOSEC) 20 MG capsule Take 1 capsule (20 mg total) by mouth daily. 10/21/13   Stephanie Coup Street, MD  phenazopyridine (PYRIDIUM) 200 MG tablet Take 1 tablet (200 mg total) by mouth 3 (three) times daily as needed for pain. 11/24/14   Crist Fat, MD  tamsulosin (FLOMAX) 0.4 MG CAPS capsule Take 1 capsule (0.4 mg total) by mouth daily. 04/07/14   Stephanie Coup Street, MD  traMADol (ULTRAM) 50 MG tablet Take 1-2 tablets (50-100 mg total) by mouth every 8 (eight) hours as needed for moderate pain. 11/24/14   Crist Fat, MD   BP 156/119 mmHg  Pulse 92  Temp(Src) 98.3 F (36.8 C) (Oral)  Resp 18  SpO2 99% Physical Exam  Constitutional: He is oriented to person, place, and time. He appears well-developed and well-nourished. No distress.  HENT:  Head: Normocephalic and atraumatic.  Oromucosa is dry  Eyes: Conjunctivae are normal.  Neck: Neck supple.  Cardiovascular: Normal rate, regular rhythm and normal heart sounds.   Pulmonary/Chest: Effort normal. No respiratory  distress. He has no wheezes. He has no rales.  Abdominal: Soft. Bowel sounds are normal. He exhibits no distension. There is no tenderness. There is no rebound and no guarding.  Musculoskeletal: He exhibits no edema.  Neurological: He is alert and oriented to person, place, and time.  Skin: Skin is warm and dry.  Nursing note and vitals reviewed.   ED Course  Procedures (including critical care time) Labs Review Labs Reviewed  CBC WITH DIFFERENTIAL/PLATELET - Abnormal; Notable for the following:    WBC 14.5 (*)    Neutro Abs 12.2 (*)    All other components within normal limits  COMPREHENSIVE METABOLIC PANEL - Abnormal; Notable for the following:    Sodium 150 (*)    Potassium 6.2 (*)    Chloride 116 (*)    CO2 20 (*)    Glucose, Bld 135 (*)  BUN 139 (*)    Creatinine, Ser 7.48 (*)    Total Protein 9.7 (*)    GFR calc non Af Amer 7 (*)    GFR calc Af Amer 8 (*)    All other components within normal limits  LIPASE, BLOOD - Abnormal; Notable for the following:    Lipase 172 (*)    All other components within normal limits  URINALYSIS, ROUTINE W REFLEX MICROSCOPIC (NOT AT [facility]) - Abnormal; Notable for the following:    APPearance CLOUDY (*)    Hgb urine dipstick LARGE (*)    Protein, ur 100 (*)    Leukocytes, UA LARGE (*)    All other components within normal limits  URINE MICROSCOPIC-ADD ON - Abnormal; Notable for the following:    Squamous Epithelial / LPF 6-30 (*)    Bacteria, UA FEW (*)    All other components within normal limits  URINE CULTURE  I-STAT CG4 LACTIC ACID, ED  I-STAT CG4 LACTIC ACID, ED    Imaging Review Dg Abd Acute W/chest  01/14/2015  CLINICAL DATA:  Vomiting, lower abdominal pain. History of hypertension, coronary artery disease, CVA, diabetes. Former smoker. EXAM: DG ABDOMEN ACUTE W/ 1V CHEST COMPARISON:  None. FINDINGS: Single view of the chest: Heart size is normal. Overall cardiomediastinal silhouette appears stable in size and configuration  compared to an earlier chest x-ray of 10/02/2013. Lungs are clear. No pleural effusions seen. Osseous and soft tissue structures about the chest are unremarkable. Supine and decubitus views of the abdomen: Bowel gas pattern is nonobstructive. No evidence of soft tissue mass or abnormal fluid collection. No evidence of free intraperitoneal air. Vascular calcifications noted within the lower abdomen and pelvis. No acute/ significant osseous abnormality seen. IMPRESSION: 1. Lungs are clear and there is no evidence of acute cardiopulmonary abnormality. 2. Nonobstructive bowel gas pattern and no evidence of acute intra-abdominal abnormality. Electronically Signed   By: Bary Richard M.D.   On: 01/14/2015 18:48   I have personally reviewed and evaluated these images and lab results as part of my medical decision-making.   EKG Interpretation   Date/Time:  Saturday January 14 2015 18:00:54 EST Ventricular Rate:  100 PR Interval:  138 QRS Duration: 90 QT Interval:  333 QTC Calculation: 429 R Axis:   40 Text Interpretation:  Sinus tachycardia Consider left atrial enlargement  TW abnormality lateral leads, new from prior Confirmed by [facility] MD,  ERIN ([id number]) on 01/15/2015 12:49:26 AM      MDM   Final diagnoses:  Renal failure  Hyperkalemia  Hypernatremia  UTI (lower urinary tract infection)  Secondary hypertension, unspecified   Pt in emergency department with nausea, vomiting, abdominal pain. Abdominal exam is benign with no tenderness. Patient is afebrile. Will get labs including EKG, electrolytes, lipase, urinalysis. Abdominal x-rays ordered.  8:03 PM X-rays are negative. Patient's pain and nausea improved with 4 mg of morphine, 4 mg of Zofran IV. His white count is 14.5. Patient's potassium came back at 6.2 with creatinine of 7.48. Patient's baseline creatinine is 1.4. Records indicate patient has been seen by urology multiple times for urethral stricture, daughter is now at bedside and  gives history of having a Foley placed 12 days ago for inability to empty bladder. He was also started on antibiotic for 5 days at that time. Foley was just removed 2 days ago. Patient states he has been able to urinate since. He denies any difficulty urinating. Daughter also reports the patient was supposed to do self  cath every night however he has not been doing not. Will give Kayexalate, albuterol, insulin for hyperkalemia. EKG shows no changes.  9:03 PM Foley placed, 800 mL in the bag. Discussed with Dr. Arlean Hopping, advised to start him on D5 quarter normal saline at a high rate, between 125 and 150 mL an hour, recheck creatinine in the morning.  Spoke with family practice, will transfer to [facility] for admission. Pt has no complaints.   Filed Vitals:   01/14/15 2133 01/14/15 2200 01/14/15 2300 01/15/15 0000  BP: 167/123 157/109 152/115 121/99  Pulse: 94 105 107 99  Temp:      TempSrc:      Resp: 15 18 15 16   SpO2: 100% 94% 95% 97%     Jaynie Crumble, PA-C 01/15/15 0048  Jaynie Crumble, PA-C 01/15/15 7341  Alvira Monday, MD 01/19/15 1342

## 2015-01-14 NOTE — ED Notes (Signed)
Patient transported to X-ray 

## 2015-01-14 NOTE — ED Notes (Signed)
Per [facility], pt c/o n/v x 3 days & began to c/o heartburn upon arrival.

## 2015-01-14 NOTE — ED Notes (Signed)
D5 & 0.2 NS requested from pharmacy.

## 2015-01-14 NOTE — ED Notes (Signed)
Pt currently in exray 

## 2015-01-14 NOTE — ED Notes (Signed)
Nurse drawing labs. 

## 2015-01-14 NOTE — ED Notes (Addendum)
Dialed [phone].  Attempted to call report to [facility], spoke with receiving RN but she requested to call back.  Call back [phone] provided.

## 2015-01-14 NOTE — ED Notes (Signed)
Bed: LT90 Expected date: 01/14/15 Expected time: 4:57 PM Means of arrival: Ambulance Comments: N/V

## 2015-01-14 NOTE — ED Notes (Signed)
Post void residual: >230ml ED PA Tatyana K at bedside

## 2015-01-15 ENCOUNTER — Encounter (HOSPITAL_COMMUNITY): Payer: Self-pay | Admitting: *Deleted

## 2015-01-15 ENCOUNTER — Inpatient Hospital Stay (HOSPITAL_COMMUNITY): Payer: Commercial Managed Care - HMO

## 2015-01-15 DIAGNOSIS — N189 Chronic kidney disease, unspecified: Secondary | ICD-10-CM

## 2015-01-15 DIAGNOSIS — E875 Hyperkalemia: Secondary | ICD-10-CM

## 2015-01-15 DIAGNOSIS — T83511A Infection and inflammatory reaction due to indwelling urethral catheter, initial encounter: Secondary | ICD-10-CM

## 2015-01-15 DIAGNOSIS — N179 Acute kidney failure, unspecified: Secondary | ICD-10-CM | POA: Diagnosis present

## 2015-01-15 DIAGNOSIS — N390 Urinary tract infection, site not specified: Secondary | ICD-10-CM

## 2015-01-15 DIAGNOSIS — E870 Hyperosmolality and hypernatremia: Secondary | ICD-10-CM | POA: Insufficient documentation

## 2015-01-15 LAB — RENAL FUNCTION PANEL
ALBUMIN: 3.5 g/dL (ref 3.5–5.0)
ANION GAP: 14 (ref 5–15)
ANION GAP: 11 (ref 5–15)
Albumin: 3.4 g/dL — ABNORMAL LOW (ref 3.5–5.0)
Albumin: 3.4 g/dL — ABNORMAL LOW (ref 3.5–5.0)
Anion gap: 12 (ref 5–15)
BUN: 126 mg/dL — AB (ref 6–20)
BUN: 120 mg/dL — ABNORMAL HIGH (ref 6–20)
BUN: 115 mg/dL — ABNORMAL HIGH (ref 6–20)
CALCIUM: 8.6 mg/dL — AB (ref 8.9–10.3)
CHLORIDE: 120 mmol/L — AB (ref 101–111)
CHLORIDE: 119 mmol/L — AB (ref 101–111)
CO2: 17 mmol/L — AB (ref 22–32)
CO2: 17 mmol/L — AB (ref 22–32)
CO2: 20 mmol/L — ABNORMAL LOW (ref 22–32)
CREATININE: 6.76 mg/dL — AB (ref 0.61–1.24)
Calcium: 8.8 mg/dL — ABNORMAL LOW (ref 8.9–10.3)
Calcium: 8.6 mg/dL — ABNORMAL LOW (ref 8.9–10.3)
Chloride: 120 mmol/L — ABNORMAL HIGH (ref 101–111)
Creatinine, Ser: 6.58 mg/dL — ABNORMAL HIGH (ref 0.61–1.24)
Creatinine, Ser: 6.13 mg/dL — ABNORMAL HIGH (ref 0.61–1.24)
GFR calc Af Amer: 9 mL/min — ABNORMAL LOW (ref >60)
GFR calc Af Amer: 9 mL/min — ABNORMAL LOW (ref >60)
GFR calc non Af Amer: 8 mL/min — ABNORMAL LOW (ref >60)
GFR calc non Af Amer: 8 mL/min — ABNORMAL LOW (ref >60)
GFR, EST AFRICAN AMERICAN: 10 mL/min — AB (ref >60)
GFR, EST NON AFRICAN AMERICAN: 8 mL/min — AB (ref >60)
GLUCOSE: 216 mg/dL — AB (ref 65–99)
GLUCOSE: 195 mg/dL — AB (ref 65–99)
Glucose, Bld: 174 mg/dL — ABNORMAL HIGH (ref 65–99)
PHOSPHORUS: 5.5 mg/dL — AB (ref 2.5–4.6)
POTASSIUM: 5.2 mmol/L — AB (ref 3.5–5.1)
Phosphorus: 6.3 mg/dL — ABNORMAL HIGH (ref 2.5–4.6)
Phosphorus: 6.1 mg/dL — ABNORMAL HIGH (ref 2.5–4.6)
Potassium: 4.9 mmol/L (ref 3.5–5.1)
Potassium: 4.7 mmol/L (ref 3.5–5.1)
SODIUM: 150 mmol/L — AB (ref 135–145)
SODIUM: 151 mmol/L — AB (ref 135–145)
Sodium: 149 mmol/L — ABNORMAL HIGH (ref 135–145)

## 2015-01-15 LAB — CBC
HCT: 41.9 % (ref 39.0–52.0)
Hemoglobin: 13 g/dL (ref 13.0–17.0)
MCH: 27.1 pg (ref 26.0–34.0)
MCHC: 31 g/dL (ref 30.0–36.0)
MCV: 87.3 fL (ref 78.0–100.0)
PLATELETS: 250 10*3/uL (ref 150–400)
RBC: 4.8 MIL/uL (ref 4.22–5.81)
RDW: 15.6 % — AB (ref 11.5–15.5)
WBC: 12.8 10*3/uL — ABNORMAL HIGH (ref 4.0–10.5)

## 2015-01-15 LAB — GLUCOSE, CAPILLARY
GLUCOSE-CAPILLARY: 201 mg/dL — AB (ref 65–99)
GLUCOSE-CAPILLARY: 168 mg/dL — AB (ref 65–99)
Glucose-Capillary: 140 mg/dL — ABNORMAL HIGH (ref 65–99)
Glucose-Capillary: 138 mg/dL — ABNORMAL HIGH (ref 65–99)

## 2015-01-15 LAB — LIPASE, BLOOD: Lipase: 169 U/L — ABNORMAL HIGH (ref 11–51)

## 2015-01-15 LAB — MAGNESIUM: MAGNESIUM: 3.5 mg/dL — AB (ref 1.7–2.4)

## 2015-01-15 LAB — MRSA PCR SCREENING: MRSA by PCR: POSITIVE — AB

## 2015-01-15 MED ORDER — CHLORHEXIDINE GLUCONATE CLOTH 2 % EX PADS
6.0000 | MEDICATED_PAD | Freq: Every day | CUTANEOUS | Status: AC
  Administered 2015-01-16 – 2015-01-20 (×5): 6 via TOPICAL

## 2015-01-15 MED ORDER — AMLODIPINE BESYLATE 10 MG PO TABS
10.0000 mg | ORAL_TABLET | Freq: Every day | ORAL | Status: DC
  Administered 2015-01-15 – 2015-01-20 (×6): 10 mg via ORAL
  Filled 2015-01-15 (×6): qty 1

## 2015-01-15 MED ORDER — INFLUENZA VAC SPLIT QUAD 0.5 ML IM SUSY: 0.5000 mL | PREFILLED_SYRINGE | INTRAMUSCULAR | Status: DC

## 2015-01-15 MED ORDER — ASPIRIN EC 81 MG PO TBEC
81.0000 mg | DELAYED_RELEASE_TABLET | Freq: Every day | ORAL | Status: DC
Start: 2015-01-15 — End: 2015-01-20
  Administered 2015-01-15 – 2015-01-20 (×6): 81 mg via ORAL
  Filled 2015-01-15 (×6): qty 1

## 2015-01-15 MED ORDER — DEXTROSE-NACL 5-0.2 % IV SOLN
INTRAVENOUS | Status: DC
  Administered 2015-01-15: 01:00:00 via INTRAVENOUS

## 2015-01-15 MED ORDER — HYDRALAZINE HCL 20 MG/ML IJ SOLN
2.0000 mg | INTRAMUSCULAR | Status: DC | PRN
  Administered 2015-01-15 – 2015-01-16 (×3): 2 mg via INTRAVENOUS
  Filled 2015-01-15 (×3): qty 1

## 2015-01-15 MED ORDER — TAMSULOSIN HCL 0.4 MG PO CAPS: 0.4000 mg | ORAL_CAPSULE | Freq: Every day | ORAL | Status: DC

## 2015-01-15 MED ORDER — TAMSULOSIN HCL 0.4 MG PO CAPS
0.4000 mg | ORAL_CAPSULE | Freq: Every day | ORAL | Status: DC
  Administered 2015-01-15 – 2015-01-17 (×3): 0.4 mg via ORAL
  Filled 2015-01-15 (×3): qty 1

## 2015-01-15 MED ORDER — ATORVASTATIN CALCIUM 40 MG PO TABS
40.0000 mg | ORAL_TABLET | Freq: Every day | ORAL | Status: DC
  Administered 2015-01-15 – 2015-01-19 (×4): 40 mg via ORAL
  Filled 2015-01-15 (×5): qty 1

## 2015-01-15 MED ORDER — ONDANSETRON HCL 4 MG/2ML IJ SOLN
4.0000 mg | Freq: Four times a day (QID) | INTRAMUSCULAR | Status: DC | PRN
  Administered 2015-01-15 – 2015-01-16 (×3): 4 mg via INTRAVENOUS
  Filled 2015-01-15 (×3): qty 2

## 2015-01-15 MED ORDER — SODIUM CHLORIDE 0.9 % IJ SOLN
3.0000 mL | Freq: Two times a day (BID) | INTRAMUSCULAR | Status: DC
  Administered 2015-01-15 – 2015-01-19 (×9): 3 mL via INTRAVENOUS

## 2015-01-15 MED ORDER — DEXTROSE 5 % IV SOLN
1.0000 g | INTRAVENOUS | Status: DC
  Administered 2015-01-15: 1 g via INTRAVENOUS
  Filled 2015-01-15 (×2): qty 10

## 2015-01-15 MED ORDER — FINASTERIDE 5 MG PO TABS
5.0000 mg | ORAL_TABLET | Freq: Every day | ORAL | Status: DC
  Administered 2015-01-15 – 2015-01-20 (×6): 5 mg via ORAL
  Filled 2015-01-15 (×6): qty 1

## 2015-01-15 MED ORDER — INSULIN ASPART 100 UNIT/ML ~~LOC~~ SOLN
0.0000 [IU] | Freq: Three times a day (TID) | SUBCUTANEOUS | Status: DC
  Administered 2015-01-15: 2 [IU] via SUBCUTANEOUS
  Administered 2015-01-15: 1 [IU] via SUBCUTANEOUS
  Administered 2015-01-15: 3 [IU] via SUBCUTANEOUS
  Administered 2015-01-16: 1 [IU] via SUBCUTANEOUS
  Administered 2015-01-16: 2 [IU] via SUBCUTANEOUS
  Administered 2015-01-17 – 2015-01-18 (×2): 1 [IU] via SUBCUTANEOUS
  Administered 2015-01-19: 2 [IU] via SUBCUTANEOUS
  Administered 2015-01-20: 1 [IU] via SUBCUTANEOUS

## 2015-01-15 MED ORDER — MUPIROCIN 2 % EX OINT
1.0000 "application " | TOPICAL_OINTMENT | Freq: Two times a day (BID) | CUTANEOUS | Status: AC
  Administered 2015-01-15 – 2015-01-19 (×10): 1 via NASAL
  Filled 2015-01-15 (×4): qty 22

## 2015-01-15 MED ORDER — INFLUENZA VAC SPLIT QUAD 0.5 ML IM SUSY
0.5000 mL | PREFILLED_SYRINGE | INTRAMUSCULAR | Status: AC
  Administered 2015-01-16: 0.5 mL via INTRAMUSCULAR
  Filled 2015-01-15: qty 0.5

## 2015-01-15 MED ORDER — HEPARIN SODIUM (PORCINE) 5000 UNIT/ML IJ SOLN
5000.0000 [IU] | Freq: Three times a day (TID) | INTRAMUSCULAR | Status: DC
  Administered 2015-01-15 (×3): 5000 [IU] via SUBCUTANEOUS
  Filled 2015-01-15 (×2): qty 1

## 2015-01-15 MED ORDER — DEXTROSE-NACL 5-0.45 % IV SOLN
INTRAVENOUS | Status: DC
  Administered 2015-01-15: 04:00:00 via INTRAVENOUS
  Administered 2015-01-15: 125 mL/h via INTRAVENOUS

## 2015-01-15 MED ORDER — DEXTROSE 5 % IV SOLN
INTRAVENOUS | Status: DC
  Administered 2015-01-15 – 2015-01-17 (×6): via INTRAVENOUS

## 2015-01-15 MED ORDER — ACETAMINOPHEN 500 MG PO TABS
500.0000 mg | ORAL_TABLET | Freq: Four times a day (QID) | ORAL | Status: DC | PRN
  Administered 2015-01-15: 500 mg via ORAL
  Filled 2015-01-15: qty 1

## 2015-01-15 NOTE — H&P (Signed)
[facility] Admission History and Physical Service Pager: [phone]  Patient name: Brian Burns Medical record number: [id number] Date of birth: 1945-07-07 Age: 69 y.o. Gender: male  Primary Care Provider: Lavon Paganini, MD Consultants: Renal  Code Status: Full  Chief Complaint: Dysuria  Assessment and Plan: Brian Burns is a 69 y.o. male presenting with acute on chronic renal failure secondary to obstruction. PMH is significant for urethral strictures, CAD, HTN, CVA 2, BPH, DM 2.   Acute on chronic renal failure secondary to obstruction: Cystoscopy with urethral dilation 11/24/14. Hx of BPH.  Difficulty with self catheterizations at home. Cr Baseline ~ 1.3.  Hyperkalemia without acute cardiac changes on EKG.  No signs of acute volume overload.  - Admit to stepdown for close cardiac, BP, and electrolyte monitoring with postobstructive diuresis - Urine catheter placed - Cr 7.48 (12/17); BUN 139 (12/17) - Renal Panel q6hrs to monitor electrolytes during possible post obstruction diuresis - Check Mag - Strict I&Os - IVF as below; consider replacing one half urine output with IVF as needed to maintain normal blood pressure and electrolytes - Consult Nephrology - Consider urology consult - Continue finasteride 5 mg daily - Holding Flomax   Ab pain / Dysuria: Resovled. Likely due to urinary retention/obstruction vs UTI.  UA with large hemoglobin and leuks, no nitrite.  Will treat with antibiotics, given elevated WBC. Lipase elevated but denies epigastric pain.  - CTX (12/17>>); consider transitioning to Keflex pending culture and improvement in symptoms - UCx: pending - Recheck lipase  Hyperkalemia: Potassium 6.2 (ED).  EKG without acute changes.  Status post Kayexalate and insulin in ED - Monitor Renal panel   Hypernatremia. Likely due to dehydration with vomiting and poor by mouth intake - Monitor Renal panel - IVF as below  Cardiac: CAD,  PAD, HF, AAA (~ 2.94x3.14cm). Echo 12/2011: EF 45% with mild diffuse hypokinesis and G1DD.  - Holding home meds: Norvasc 10 mg daily, Flomax. Restart as needed - Monitor for hypotension if develops postobstructive diuresis  Diabetes: A1c 6.42/19/16 - A1c: Ordered - Holding Metformin - SSI sensitive AC  CVA - Continue home: ASA 81 mg daily, Lipitor 40 mg daily at bedtime  FEN/GI: Heart healthy/carb mod diet; D5 1/2NS _0  per renal rec Prophylaxis: Subcutaneous heparin  Disposition: Admit to stepdown; discharge pending improvement of renal failure and stabilization of electrolytes  History of Present Illness:  Brian Burns is a 69 y.o. male presenting with nausea, vomiting and suprapubic pain.  He reports progressive nausea, vomiting and pain over the past 3-4 days.  Also reports dysuria without flank pain, fevers or chills.  History of urethral stricture requiring dilation.  He reports he was having difficulty with catheterizing himself at home.  History of 2 previous strokes, denies residual deficits.  Denies current chest pain, shortness of breath, abdominal pain, back pain, nausea or vomiting.   Review Of Systems: Per HPI with the following additions:  Otherwise the remainder of the systems were negative.  Patient Active Problem List   Diagnosis Date Noted  . Renal failure 01/14/2015  . Actinic keratosis 10/07/2014  . Bilateral wrist pain 09/09/2014  . Food impaction of esophagus 06/01/2014  . Decreased visual acuity 04/08/2014  . Acute cystitis with positive culture 03/30/2014  . Dysuria 03/18/2014  . Polyuria 03/18/2014  . Loss of weight 03/18/2014  . Callus of foot 12/02/2013  . GERD (gastroesophageal reflux disease) 10/21/2013  . Chest congestion 10/21/2013  . Right lower lobe pneumonia  10/11/2013  . Constipation 10/11/2013  . Type 2 diabetes mellitus ([place]) 09/14/2013  . AAA (abdominal aortic aneurysm) without rupture ([place]) 06/24/2013  . PVD (peripheral vascular  disease) ([place]) 06/24/2013  . Throat discomfort 04/09/2013  . Vertigo 03/18/2013  . Hearing loss of both ears 03/18/2013  . BPH (benign prostatic hyperplasia) 02/16/2013  . Urethral stricture 02/16/2013  . Healthcare maintenance 02/16/2013  . Other malaise and fatigue 07/17/2012  . Pain in joint, pelvic region and thigh 06/18/2012  . Back pain 06/18/2012  . Peripheral vascular disease, unspecified ([place]) 06/18/2012  . Hematuria 06/11/2012  . Neuropathic pain of both legs 05/10/2012  . CVA (cerebral infarction) 01/10/2012  . TIA (transient ischemic attack) 01/08/2012  . Knee pain, bilateral 04/09/2011  . Urinary frequency 04/09/2011  . Carotid artery calcification 07/20/2009  . PVD WITH CLAUDICATION 07/20/2009  . BACK PAIN, LUMBAR, CHRONIC 11/27/2007  . Hyperlipidemia 02/05/2007  . Overweight(278.02) 06/04/2006  . HYPERTENSION, BENIGN SYSTEMIC 03/27/2006  . Coronary atherosclerosis 03/27/2006  . IMPOTENCE, ORGANIC 03/27/2006    Past Medical History: Past Medical History  Diagnosis Date  . Hypertension   . HLD (hyperlipidemia)   . Erectile dysfunction   . Chronic back pain   . CAD (coronary artery disease)   . GERD (gastroesophageal reflux disease)   . Arthritis     knees,lower back  . Carotid artery disease ([place])   . PVD (peripheral vascular disease) with claudication ([place])   . AAA (abdominal aortic aneurysm) without rupture ([place]) 06/2014    no change in last exam from 2015  . History of CVA (cerebrovascular accident)     2009  &  2013  . Type 2 diabetes mellitus ([facility])     no meds per pt    Past Surgical History: Past Surgical History  Procedure Laterality Date  . Esophagogastroduodenoscopy N/A 05/15/2014    Procedure: ESOPHAGOGASTRODUODENOSCOPY (EGD);  Surgeon: Laurence Spates, MD;  Location: [facility] ENDOSCOPY;  Service: Endoscopy;  Laterality: N/A;  . Cataract extraction w/ intraocular lens  implant, bilateral  april and may 2014  . Transthoracic echocardiogram  01-07-2012     mild to moderate dilated LV,  ef 45-50%,  mild basal hypokinesis,  grade I diastolic  dysfunction/  trivial AR/  mild MR  . Cardiovascular stress test  08-09-2009    mild global hypokinesis,  no ischemia or scar/  ef 41%  . Cystoscopy with urethral dilatation N/A 11/24/2014    Procedure: CYSTOSCOPY WITH URETHRAL DILATATION;  Surgeon: Ardis Hughs, MD;  Location: [facility];  Service: Urology;  Laterality: N/A;  BALLOON DILATION        . Cystoscopy with retrograde urethrogram N/A 11/24/2014    Procedure: CYSTOSCOPY WITH RETROGRADE URETHROGRAM;  Surgeon: Ardis Hughs, MD;  Location: [facility];  Service: Urology;  Laterality: N/A;    Social History: Social History  Substance Use Topics  . Smoking status: Former Smoker -- 0.50 packs/day for 40 years    Types: Cigarettes    Quit date: 01/29/2004  . Smokeless tobacco: Never Used  . Alcohol Use: No   Please also refer to relevant sections of EMR.  Family History: Family History  Problem Relation Age of Onset  . Lung disease Brother   . Kidney disease Brother   . Heart disease Brother     died at 68  . Heart disease Mother   . Kidney disease Mother   . Lung disease Mother     Allergies and Medications: Allergies  Allergen Reactions  . Novocain [Procaine Hcl] Nausea And Vomiting   No current facility-administered medications on file prior to encounter.   Current Outpatient Prescriptions on File Prior to Encounter  Medication Sig Dispense Refill  . acetaminophen (TYLENOL) 500 MG tablet Take 1 tablet (500 mg total) by mouth every 6 (six) hours as needed. 30 tablet 0  . amLODipine (NORVASC) 10 MG tablet Take 1 tablet (10 mg total) by mouth daily. 90 tablet 1  . aspirin EC 81 MG tablet Take 81 mg by mouth daily.    Marland Kitchen atorvastatin (LIPITOR) 40 MG tablet Take 1 tablet (40 mg total) by mouth daily. 90 tablet 1  . finasteride (PROSCAR) 5 MG tablet Take 1 tablet (5 mg total) by mouth  daily. 30 tablet 1  . meclizine (ANTIVERT) 25 MG tablet Take 1 tablet (25 mg total) by mouth 3 (three) times daily as needed for dizziness. 30 tablet 1  . traMADol (ULTRAM) 50 MG tablet Take 1-2 tablets (50-100 mg total) by mouth every 8 (eight) hours as needed for moderate pain. 50 tablet 0  . gabapentin (NEURONTIN) 100 MG capsule Take 1 capsule (100 mg total) by mouth 3 (three) times daily. (Patient not taking: Reported on 01/14/2015) 180 capsule 2  . metFORMIN (GLUCOPHAGE) 500 MG tablet Take 1 tablet (500 mg total) by mouth daily. (Patient not taking: Reported on 01/14/2015) 180 tablet 3  . omeprazole (PRILOSEC) 20 MG capsule Take 1 capsule (20 mg total) by mouth daily. (Patient not taking: Reported on 01/14/2015) 30 capsule 3  . phenazopyridine (PYRIDIUM) 200 MG tablet Take 1 tablet (200 mg total) by mouth 3 (three) times daily as needed for pain. (Patient not taking: Reported on 01/14/2015) 10 tablet 0  . tamsulosin (FLOMAX) 0.4 MG CAPS capsule Take 1 capsule (0.4 mg total) by mouth daily. (Patient not taking: Reported on 01/14/2015) 30 capsule 3    Objective: BP 137/77 mmHg  Pulse 106  Temp(Src) 98.1 F (36.7 C) (Oral)  Resp 12  Ht _0  (1.93 m)  Wt 177 lb 14.6 oz (80.7 kg)  BMI 21.67 kg/m2  SpO2 97% Exam: General: NAD; thin, frail appearing Eyes: EOMI; PERRLA ENTM: MMM Neck: Supple. thyroid nonpalpable; no carotid bruits Cardiovascular: Tachycardia with regular rhythm no m/r/g; radial and DP pulses 2+ bilaterally; Cap Refill < 3 secs Respiratory: CTAB; normal effort Abdomen: SNTND; BS +; no guarding or rebounding MSK: Normal tone and ROM Skin: No Rashes. Skin turgor normal Neuro: A&Ox3; CN 3-12 intact; gross motor and sensory intact Psych: Normal speech, thought content and affect  Labs and Imaging: CBC BMET   Recent Labs Lab 01/14/15 1827  WBC 14.5*  HGB 15.1  HCT 46.7  PLT 329    Recent Labs Lab 01/14/15 1827  NA 150*  K 6.2*  CL 116*  CO2 20*  BUN 139*   CREATININE 7.48*  GLUCOSE 135*  CALCIUM 9.7     Hepatic Function Latest Ref Rng 01/14/2015 05/15/2014 03/18/2014  Total Protein 6.5 - 8.1 g/dL 9.7(H) 7.6 7.1  Albumin 3.5 - 5.0 g/dL 4.6 3.6 4.3  AST 15 - 41 U/L _1 ALT 17 - 63 U/L 33 19 23  Alk Phosphatase 38 - 126 U/L 125 93 133(H)  Total Bilirubin 0.3 - 1.2 mg/dL 0.7 0.5 0.8   Lactic acid: 1.98  Dg Abd Acute W/chest  01/14/2015  CLINICAL DATA:  Vomiting, lower abdominal pain. History of hypertension, coronary artery disease, CVA, diabetes. Former smoker. EXAM: DG ABDOMEN ACUTE  W/ 1V CHEST COMPARISON:  None. FINDINGS: Single view of the chest: Heart size is normal. Overall cardiomediastinal silhouette appears stable in size and configuration compared to an earlier chest x-ray of 10/02/2013. Lungs are clear. No pleural effusions seen. Osseous and soft tissue structures about the chest are unremarkable. Supine and decubitus views of the abdomen: Bowel gas pattern is nonobstructive. No evidence of soft tissue mass or abnormal fluid collection. No evidence of free intraperitoneal air. Vascular calcifications noted within the lower abdomen and pelvis. No acute/ significant osseous abnormality seen. IMPRESSION: 1. Lungs are clear and there is no evidence of acute cardiopulmonary abnormality. 2. Nonobstructive bowel gas pattern and no evidence of acute intra-abdominal abnormality. Electronically Signed   By: Franki Cabot M.D.   On: 01/14/2015 18:48    Olam Idler, MD 01/15/2015, 2:06 AM PGY-3, [place] Intern pager: [phone], text pages welcome

## 2015-01-15 NOTE — Progress Notes (Signed)
Family Medicine Teaching Service Daily Progress Note Intern Pager: [phone]  Patient name: Brian Burns Medical record number: [id number] Date of birth: 31-Oct-1945 Age: 69 y.o. Gender: male  Primary Care Provider: Shirlee Latch, MD Consultants: Renal Code Status: Full  Pt Overview and Major Events to Date:  12/18: admit for acute on chronic renal failure secondary to obstruction  Assessment and Plan:  Brian Burns is a 69 y.o. male presenting with acute on chronic renal failure secondary to obstruction. PMH is significant for urethral strictures, CAD, HTN, CVA 2, BPH, DM 2.   Acute on chronic renal failure secondary to obstruction: - Admit to stepdown for close cardiac, BP, and electrolyte monitoring with postobstructive diuresis - Urine catheter placed - Cr 7.48>> 6.76 ; BUN 139>> 126  - Renal Panel q6hrs to monitor electrolytes during possible post obstruction diuresis - Check Mag - Strict I&Os - IVF as below; consider replacing one half urine output with IVF as needed to maintain normal blood pressure and electrolytes - Consult Nephrology - Consider urology consult - Continue finasteride 5 mg daily - Holding Flomax   Ab pain / Dysuria:  - CTX (12/17>>); consider transitioning to Keflex pending culture and improvement in symptoms - UCx: pending - lipase 172>>169  Hyperkalemia: Resolved, K now 4.9, Potassium 6.2 (ED). EKG without acute changes. Status post Kayexalate and insulin in ED - Monitor Renal panel   Hypernatremia.Improving, likely due to dehydration with vomiting and poor by mouth intake - Monitor Renal panel - IVF as below  Cardiac: CAD, HTN, PAD, HF, AAA (~ 2.94x3.14cm). Echo 12/2011: EF 45% with mild diffuse hypokinesis and G1DD.  - Restart home Norvasc 10 mg daily, holding Flomax. - Monitor for hypotension if develops postobstructive diuresis  Diabetes: A1c 6.4, 03/18/14 - A1pending - Holding Metformin - SSI sensitive AC  CVA - Continue  home: ASA 81 mg daily, Lipitor 40 mg daily at bedtime  FEN/GI: Heart healthy/carb mod diet; D5 1/2NS @125  per renal rec Prophylaxis: Subcutaneous heparin  Disposition: Admit to stepdown; discharge pending improvement of renal failure and stabilization of electrolytes  Subjective:  Denies abdominal pain, chest pain, SOB, headache  Objective: Temp:  [98 F (36.7 C)-98.3 F (36.8 C)] 98 F (36.7 C) (12/18 0300) Pulse Rate:  [88-107] 89 (12/18 0300) Resp:  [8-18] 12 (12/18 0500) BP: (121-167)/(77-125) 158/112 mmHg (12/18 0500) SpO2:  [94 %-100 %] 98 % (12/18 0300) Weight:  [177 lb 14.6 oz (80.7 kg)] 177 lb 14.6 oz (80.7 kg) (12/18 0109) Physical Exam: General: NAD, sitting in bed, eating eggs Cardiovascular: RRR Respiratory: CTAB, normal WOB Abdomen: soft, non tender Extremities: non tender, no LE edema  Laboratory:  Recent Labs Lab 01/14/15 1827 01/15/15 0501  WBC 14.5* 12.8*  HGB 15.1 13.0  HCT 46.7 41.9  PLT 329 250    Recent Labs Lab 01/14/15 1827 01/15/15 0501  NA 150* 149*  K 6.2* 4.9  CL 116* 120*  CO2 20* 17*  BUN 139* 126*  CREATININE 7.48* 6.76*  CALCIUM 9.7 8.8*  PROT 9.7*  --   BILITOT 0.7  --   ALKPHOS 125  --   ALT 33  --   AST 29  --   GLUCOSE 135* 216*      Brian Aid, MD 01/15/2015, 8:19 AM PGY-2, [place] [facility] FPTS Intern pager: [phone], text pages welcome

## 2015-01-15 NOTE — Progress Notes (Signed)
BP have been 140-170/90-120 over night. FMTS was called and notified. No new orders at this time.   Alba Destine, RN, BSN

## 2015-01-15 NOTE — Progress Notes (Signed)
Patient's  blood pressure has been elevated this AM in 150s-160s/110s-120s. FMTS notified. Home med ordered and given.  Spoke with Dr. Kennon Rounds.  Will continue to monitor at this time since DBP is decreasing.

## 2015-01-15 NOTE — Consult Note (Signed)
Renal Service Consult Note [facility]  GORO SHOWELL 01/15/2015 Laberta Wilbon D Requesting Physician:  Dr Lum Babe  Reason for Consult:  Acute on CKD3 HPI: The patient is a 69 y.o. year-old with hx of HTN, HL, back pain, CAD, PVD, hx CVA x 2 who presented to ED yesterday with nausea/ vomiting and heartburn x 2 days. He recently had procedure by urology of dilatation of urethral stricture in late October 2016.  Also recently daughter said the patient required foley placement 2 wks ago for inability to empty his bladder.  The catheter was removed about 2 days ago.  Patient denied difficulty urinating but was poor historian.    Creatinine on arrival was 7.48. Foley catheter was placed with 800 mL urine returned. There has been 750 cc UOP today and creat is down to 6.58 at 11 am today.   Renal US today at 12 noon shows 10-12 cm kidneys , hyperechoic, no hydro or mass noted.   Patient reports abd pain periumbilical.  No n/v, CP, SOB. Slow to respond but oriented.  ROS  no SOB, cough  no joint pain  no ankle swelling  no orthopnea  Past Medical History  Past Medical History  Diagnosis Date  . Hypertension   . HLD (hyperlipidemia)   . Erectile dysfunction   . Chronic back pain   . CAD (coronary artery disease)   . GERD (gastroesophageal reflux disease)   . Arthritis     knees,lower back  . Carotid artery disease (HCC)   . PVD (peripheral vascular disease) with claudication ([facility]CC)   . AAA (abdominal aortic aneurysm) without rupture ([facility]) 06/2014    no change in last exam from 2015  . History of CVA (cerebrovascular accident)     2009  &  2013  . Type 2 diabetes mellitus (HCC)     no meds per pt   Past Surgical History  Past Surgical History  Procedure Laterality Date  . Esophagogastroduodenoscopy N/A 05/15/2014    Procedure: ESOPHAGOGASTRODUODENOSCOPY (EGD);  Surgeon: Carman Ching, MD;  Location: [facility] ENDOSCOPY;  Service: Endoscopy;  Laterality: N/A;  . Cataract  extraction w/ intraocular lens  implant, bilateral  april and may 2014  . Transthoracic echocardiogram  01-07-2012    mild to moderate dilated LV,  ef 45-50%,  mild basal hypokinesis,  grade I diastolic  dysfunction/  trivial AR/  mild MR  . Cardiovascular stress test  08-09-2009    mild global hypokinesis,  no ischemia or scar/  ef 41%  . Cystoscopy with urethral dilatation N/A 11/24/2014    Procedure: CYSTOSCOPY WITH URETHRAL DILATATION;  Surgeon: Crist Fat, MD;  Location: [facility];  Service: Urology;  Laterality: N/A;  BALLOON DILATION        . Cystoscopy with retrograde urethrogram N/A 11/24/2014    Procedure: CYSTOSCOPY WITH RETROGRADE URETHROGRAM;  Surgeon: Crist Fat, MD;  Location: [facility];  Service: Urology;  Laterality: N/A;   Family History  Family History  Problem Relation Age of Onset  . Lung disease Brother   . Kidney disease Brother   . Heart disease Brother     died at 57  . Heart disease Mother   . Kidney disease Mother   . Lung disease Mother    Social History  reports that he quit smoking about 10 years ago. His smoking use included Cigarettes. He has a 20 pack-year smoking history. He has never used smokeless tobacco. He reports that he does  not drink alcohol or use illicit drugs. Allergies  Allergies  Allergen Reactions  . Novocain [Procaine Hcl] Nausea And Vomiting   Home medications Prior to Admission medications   Medication Sig Start Date End Date Taking? Authorizing Provider  acetaminophen (TYLENOL) 500 MG tablet Take 1 tablet (500 mg total) by mouth every 6 (six) hours as needed. 08/13/14  Yes Ladona Mow, PA-C  amLODipine (NORVASC) 10 MG tablet Take 1 tablet (10 mg total) by mouth daily. 02/24/14  Yes Stephanie Coup Street, MD  aspirin EC 81 MG tablet Take 81 mg by mouth daily.   Yes Historical Provider, MD  atorvastatin (LIPITOR) 40 MG tablet Take 1 tablet (40 mg total) by mouth daily. 09/27/14  Yes  Erasmo Downer, MD  finasteride (PROSCAR) 5 MG tablet Take 1 tablet (5 mg total) by mouth daily. 04/26/13  Yes Stephanie Coup Street, MD  meclizine (ANTIVERT) 25 MG tablet Take 1 tablet (25 mg total) by mouth 3 (three) times daily as needed for dizziness. 10/14/14  Yes Federico Flake, MD  traMADol (ULTRAM) 50 MG tablet Take 1-2 tablets (50-100 mg total) by mouth every 8 (eight) hours as needed for moderate pain. 11/24/14  Yes Crist Fat, MD  gabapentin (NEURONTIN) 100 MG capsule Take 1 capsule (100 mg total) by mouth 3 (three) times daily. Patient not taking: Reported on 01/14/2015 07/23/14   Stephanie Coup Street, MD  metFORMIN (GLUCOPHAGE) 500 MG tablet Take 1 tablet (500 mg total) by mouth daily. Patient not taking: Reported on 01/14/2015 09/14/13   Myra Rude, MD  omeprazole (PRILOSEC) 20 MG capsule Take 1 capsule (20 mg total) by mouth daily. Patient not taking: Reported on 01/14/2015 10/21/13   Stephanie Coup Street, MD  phenazopyridine (PYRIDIUM) 200 MG tablet Take 1 tablet (200 mg total) by mouth 3 (three) times daily as needed for pain. Patient not taking: Reported on 01/14/2015 11/24/14   Crist Fat, MD  tamsulosin [facility]) 0.4 MG CAPS capsule Take 1 capsule (0.4 mg total) by mouth daily. Patient not taking: Reported on 01/14/2015 04/07/14   Stephanie Coup Street, MD   Liver Function Tests  Recent Labs Lab 01/14/15 1827 01/15/15 0501 01/15/15 1113  AST 29  --   --   ALT 33  --   --   ALKPHOS 125  --   --   BILITOT 0.7  --   --   PROT 9.7*  --   --   ALBUMIN 4.6 3.4* 3.4*    Recent Labs Lab 01/14/15 1827 01/15/15 0501  LIPASE 172* 169*   CBC  Recent Labs Lab 01/14/15 1827 01/15/15 0501  WBC 14.5* 12.8*  NEUTROABS 12.2*  --   HGB 15.1 13.0  HCT 46.7 41.9  MCV 86.6 87.3  PLT 329 250   Basic Metabolic Panel  Recent Labs Lab 01/14/15 1827 01/15/15 0501 01/15/15 1113  NA 150* 149* 150*  K 6.2* 4.9 5.2*  CL 116* 120* 119*  CO2 20*  17* 17*  GLUCOSE 135* 216* 195*  BUN 139* 126* 120*  CREATININE 7.48* 6.76* 6.58*  CALCIUM 9.7 8.8* 8.6*  PHOS  --  6.3* 6.1*    Filed Vitals:   01/15/15 0947 01/15/15 1045 01/15/15 1046 01/15/15 1230  BP: 157/122 150/111 157/115 160/106  Pulse:    91  Temp:    97.2 F (36.2 C)  TempSrc:    Oral  Resp:    17  Height:      Weight:  SpO2:    96%   Exam Alert, no distress wdwn, calm No rash, cyanosis or gangrene Sclera anicteric, throat clear Flat neck veins Chest clear bilat no rales or wheezing Cor no MRG RRR Abd no scars flat no hsm or masses GU normal male w foley, clear yellow urine MS no joint effusion, good fem/pop pulses, dec'd in the feet but present Ext no edema Neuro sluggish responses, Ox 3 "trump" etc  UA cloudy large LE, 100 prot, 6-30 rbc/ epis, tntc wbc, 1.018 Renal US today at 12 noon shows 10-12 cm kidneys , hyperechoic, no hydro or mass noted. US done after foley placed CXR clear   Assessment: 1 Acute on CKD3 - due to bladder outlet obstruction most likely 2 Hypernatremia 3 Volume looks euvolemic to slightly dry 4 HTN uncontrolled. On norvasc at home 5 Hx CVA x 2 6 Urethral stricture sp dilatation Oct '16 7 Pyuria, cx's pending on Rocephin 8 Hyperkalemia resolved   Plan - change to D5W at 150-175/ hr, labs in am, should cont to improve  Vinson Moselle MD [facility] pager [phone]    cell [phone] 01/15/2015, 4:18 PM

## 2015-01-15 NOTE — ED Notes (Signed)
Pt placed on bedpan

## 2015-01-16 ENCOUNTER — Inpatient Hospital Stay (HOSPITAL_COMMUNITY): Payer: Commercial Managed Care - HMO

## 2015-01-16 ENCOUNTER — Encounter (HOSPITAL_COMMUNITY): Payer: Self-pay | Admitting: Cardiology

## 2015-01-16 DIAGNOSIS — I480 Paroxysmal atrial fibrillation: Secondary | ICD-10-CM | POA: Diagnosis not present

## 2015-01-16 DIAGNOSIS — E1122 Type 2 diabetes mellitus with diabetic chronic kidney disease: Secondary | ICD-10-CM | POA: Diagnosis not present

## 2015-01-16 DIAGNOSIS — R066 Hiccough: Secondary | ICD-10-CM | POA: Insufficient documentation

## 2015-01-16 DIAGNOSIS — R109 Unspecified abdominal pain: Secondary | ICD-10-CM | POA: Diagnosis present

## 2015-01-16 DIAGNOSIS — E875 Hyperkalemia: Secondary | ICD-10-CM | POA: Diagnosis not present

## 2015-01-16 DIAGNOSIS — E43 Unspecified severe protein-calorie malnutrition: Secondary | ICD-10-CM | POA: Insufficient documentation

## 2015-01-16 DIAGNOSIS — I4891 Unspecified atrial fibrillation: Secondary | ICD-10-CM

## 2015-01-16 DIAGNOSIS — K859 Acute pancreatitis without necrosis or infection, unspecified: Secondary | ICD-10-CM | POA: Diagnosis not present

## 2015-01-16 DIAGNOSIS — N179 Acute kidney failure, unspecified: Secondary | ICD-10-CM | POA: Diagnosis not present

## 2015-01-16 DIAGNOSIS — E440 Moderate protein-calorie malnutrition: Secondary | ICD-10-CM | POA: Insufficient documentation

## 2015-01-16 DIAGNOSIS — E1151 Type 2 diabetes mellitus with diabetic peripheral angiopathy without gangrene: Secondary | ICD-10-CM | POA: Diagnosis not present

## 2015-01-16 DIAGNOSIS — I4892 Unspecified atrial flutter: Secondary | ICD-10-CM

## 2015-01-16 DIAGNOSIS — R1011 Right upper quadrant pain: Secondary | ICD-10-CM

## 2015-01-16 DIAGNOSIS — E870 Hyperosmolality and hypernatremia: Secondary | ICD-10-CM | POA: Diagnosis not present

## 2015-01-16 DIAGNOSIS — N390 Urinary tract infection, site not specified: Secondary | ICD-10-CM | POA: Diagnosis not present

## 2015-01-16 HISTORY — DX: Unspecified atrial flutter: I48.92

## 2015-01-16 LAB — RENAL FUNCTION PANEL
ALBUMIN: 3.4 g/dL — AB (ref 3.5–5.0)
ALBUMIN: 3.6 g/dL (ref 3.5–5.0)
ANION GAP: 14 (ref 5–15)
Albumin: 3.3 g/dL — ABNORMAL LOW (ref 3.5–5.0)
Anion gap: 9 (ref 5–15)
Anion gap: 11 (ref 5–15)
BUN: 105 mg/dL — AB (ref 6–20)
BUN: 93 mg/dL — ABNORMAL HIGH (ref 6–20)
BUN: 88 mg/dL — ABNORMAL HIGH (ref 6–20)
CALCIUM: 8.5 mg/dL — AB (ref 8.9–10.3)
CALCIUM: 8.5 mg/dL — AB (ref 8.9–10.3)
CHLORIDE: 115 mmol/L — AB (ref 101–111)
CO2: 21 mmol/L — ABNORMAL LOW (ref 22–32)
CO2: 20 mmol/L — AB (ref 22–32)
CO2: 17 mmol/L — ABNORMAL LOW (ref 22–32)
CREATININE: 5.8 mg/dL — AB (ref 0.61–1.24)
Calcium: 8.4 mg/dL — ABNORMAL LOW (ref 8.9–10.3)
Chloride: 118 mmol/L — ABNORMAL HIGH (ref 101–111)
Chloride: 115 mmol/L — ABNORMAL HIGH (ref 101–111)
Creatinine, Ser: 5.43 mg/dL — ABNORMAL HIGH (ref 0.61–1.24)
Creatinine, Ser: 4.99 mg/dL — ABNORMAL HIGH (ref 0.61–1.24)
GFR calc Af Amer: 10 mL/min — ABNORMAL LOW (ref >60)
GFR calc Af Amer: 11 mL/min — ABNORMAL LOW (ref >60)
GFR calc non Af Amer: 10 mL/min — ABNORMAL LOW (ref >60)
GFR, EST AFRICAN AMERICAN: 13 mL/min — AB (ref >60)
GFR, EST NON AFRICAN AMERICAN: 9 mL/min — AB (ref >60)
GFR, EST NON AFRICAN AMERICAN: 11 mL/min — AB (ref >60)
GLUCOSE: 162 mg/dL — AB (ref 65–99)
GLUCOSE: 131 mg/dL — AB (ref 65–99)
Glucose, Bld: 155 mg/dL — ABNORMAL HIGH (ref 65–99)
PHOSPHORUS: 5.1 mg/dL — AB (ref 2.5–4.6)
PHOSPHORUS: 4.9 mg/dL — AB (ref 2.5–4.6)
PHOSPHORUS: 4.7 mg/dL — AB (ref 2.5–4.6)
Potassium: 4.5 mmol/L (ref 3.5–5.1)
Potassium: 4.3 mmol/L (ref 3.5–5.1)
Potassium: 4.3 mmol/L (ref 3.5–5.1)
SODIUM: 148 mmol/L — AB (ref 135–145)
SODIUM: 146 mmol/L — AB (ref 135–145)
Sodium: 146 mmol/L — ABNORMAL HIGH (ref 135–145)

## 2015-01-16 LAB — TROPONIN I
TROPONIN I: 0.03 ng/mL (ref <0.031)
TROPONIN I: 0.04 ng/mL — AB (ref <0.031)
Troponin I: 0.04 ng/mL — ABNORMAL HIGH (ref <0.031)

## 2015-01-16 LAB — CBC
HEMATOCRIT: 39.6 % (ref 39.0–52.0)
Hemoglobin: 12.5 g/dL — ABNORMAL LOW (ref 13.0–17.0)
MCH: 27.4 pg (ref 26.0–34.0)
MCHC: 31.6 g/dL (ref 30.0–36.0)
MCV: 86.8 fL (ref 78.0–100.0)
PLATELETS: 218 10*3/uL (ref 150–400)
RBC: 4.56 MIL/uL (ref 4.22–5.81)
RDW: 15.7 % — AB (ref 11.5–15.5)
WBC: 7.5 10*3/uL (ref 4.0–10.5)

## 2015-01-16 LAB — GLUCOSE, CAPILLARY
GLUCOSE-CAPILLARY: 127 mg/dL — AB (ref 65–99)
GLUCOSE-CAPILLARY: 112 mg/dL — AB (ref 65–99)
Glucose-Capillary: 155 mg/dL — ABNORMAL HIGH (ref 65–99)
Glucose-Capillary: 119 mg/dL — ABNORMAL HIGH (ref 65–99)

## 2015-01-16 LAB — PROTIME-INR
INR: 1.33 (ref 0.00–1.49)
PROTHROMBIN TIME: 16.6 s — AB (ref 11.6–15.2)

## 2015-01-16 LAB — HEPARIN LEVEL (UNFRACTIONATED): HEPARIN UNFRACTIONATED: 0.98 [IU]/mL — AB (ref 0.30–0.70)

## 2015-01-16 LAB — HEMOGLOBIN A1C
Hgb A1c MFr Bld: 6.8 % — ABNORMAL HIGH (ref 4.8–5.6)
Mean Plasma Glucose: 148 mg/dL

## 2015-01-16 MED ORDER — HEPARIN BOLUS VIA INFUSION
2000.0000 [IU] | Freq: Once | INTRAVENOUS | Status: AC
  Administered 2015-01-16: 2000 [IU] via INTRAVENOUS
  Filled 2015-01-16: qty 2000

## 2015-01-16 MED ORDER — WARFARIN - PHARMACIST DOSING INPATIENT
Freq: Every day | Status: DC
  Administered 2015-01-17: 18:00:00

## 2015-01-16 MED ORDER — WARFARIN SODIUM 5 MG PO TABS
7.5000 mg | ORAL_TABLET | Freq: Once | ORAL | Status: AC
  Administered 2015-01-16: 7.5 mg via ORAL
  Filled 2015-01-16: qty 2

## 2015-01-16 MED ORDER — ONDANSETRON 4 MG PO TBDP
4.0000 mg | ORAL_TABLET | Freq: Three times a day (TID) | ORAL | Status: DC | PRN
  Filled 2015-01-16 (×2): qty 1

## 2015-01-16 MED ORDER — DOXYCYCLINE HYCLATE 100 MG PO TABS
100.0000 mg | ORAL_TABLET | Freq: Two times a day (BID) | ORAL | Status: DC
  Administered 2015-01-16 – 2015-01-18 (×6): 100 mg via ORAL
  Filled 2015-01-16 (×6): qty 1

## 2015-01-16 MED ORDER — DILTIAZEM HCL 100 MG IV SOLR
5.0000 mg/h | INTRAVENOUS | Status: DC
  Administered 2015-01-16 (×2): 5 mg/h via INTRAVENOUS
  Filled 2015-01-16 (×2): qty 100

## 2015-01-16 MED ORDER — ACETAMINOPHEN 325 MG PO TABS
650.0000 mg | ORAL_TABLET | Freq: Three times a day (TID) | ORAL | Status: DC
  Administered 2015-01-16 – 2015-01-20 (×11): 650 mg via ORAL
  Filled 2015-01-16 (×11): qty 2

## 2015-01-16 MED ORDER — METOCLOPRAMIDE HCL 5 MG/ML IJ SOLN
5.0000 mg | Freq: Four times a day (QID) | INTRAMUSCULAR | Status: AC
  Administered 2015-01-17 (×2): 5 mg via INTRAVENOUS
  Filled 2015-01-16 (×3): qty 2

## 2015-01-16 MED ORDER — DILTIAZEM HCL 25 MG/5ML IV SOLN
15.0000 mg | Freq: Once | INTRAVENOUS | Status: AC
  Administered 2015-01-16: 15 mg via INTRAVENOUS
  Filled 2015-01-16: qty 5

## 2015-01-16 MED ORDER — HEPARIN (PORCINE) IN NACL 100-0.45 UNIT/ML-% IJ SOLN
1000.0000 [IU]/h | INTRAMUSCULAR | Status: DC
  Administered 2015-01-16: 1200 [IU]/h via INTRAVENOUS
  Filled 2015-01-16: qty 250

## 2015-01-16 MED ORDER — PANTOPRAZOLE SODIUM 40 MG PO TBEC
40.0000 mg | DELAYED_RELEASE_TABLET | Freq: Every day | ORAL | Status: DC
  Administered 2015-01-16 – 2015-01-20 (×5): 40 mg via ORAL
  Filled 2015-01-16 (×6): qty 1

## 2015-01-16 MED ORDER — OXYCODONE HCL 5 MG PO TABS
2.5000 mg | ORAL_TABLET | Freq: Four times a day (QID) | ORAL | Status: DC | PRN
  Administered 2015-01-19: 5 mg via ORAL
  Filled 2015-01-16: qty 1

## 2015-01-16 NOTE — Consult Note (Signed)
Admit date: 01/14/2015 Referring Physician  Dr. Lum Babe Primary Physician  [name] Primary Cardiologist  Dr. Marca Ancona Reason for Consultation  New onset atrial fibrillation  HPI: 69 Y/O M with PMX of CVA, HTN, HLD, Erectile Dysfunction,BPH, CAD, PVD, AAA,DM2, Ant urethral stricture presented with hx of N/V as well as difficulty with urination for 3 days. He stated he just couldn't keep anything down. He denies fever. He uses urine catheter at home and he was unable to self cath himself. He did not give hx of dysuria but per resident's documentation he has dysuria and stomach discomfort. He denies change in bowel habit. He was found to have acute kidney injury secondary to bladder outlet obstruction.  He was dehydrated and had hyperkalemia.  This has improved with IVF hydration.  Early this am developed atrial fibrillation with RVR.  There is no history of this in the past and he was in NSR on admission.  He was given IV Cardizem bolus and started him on a cardizem gtt.  2D echo with low normal LVF EF 50-55%.  Cardiology is now asked to consult for further evaluation.       PMH:   Past Medical History  Diagnosis Date  . Hypertension   . HLD (hyperlipidemia)   . Erectile dysfunction   . Chronic back pain   . CAD (coronary artery disease)   . GERD (gastroesophageal reflux disease)   . Arthritis     knees,lower back  . Carotid artery disease (HCC)   . PVD (peripheral vascular disease) with claudication (HCC)   . AAA (abdominal aortic aneurysm) without rupture (HCC) 06/2014    no change in last exam from 2015  . History of CVA (cerebrovascular accident)     2009  &  2013  . Type 2 diabetes mellitus (HCC)     no meds per pt     PSH:   Past Surgical History  Procedure Laterality Date  . Esophagogastroduodenoscopy N/A 05/15/2014    Procedure: ESOPHAGOGASTRODUODENOSCOPY (EGD);  Surgeon: Carman Ching, MD;  Location: [facility] ENDOSCOPY;  Service: Endoscopy;  Laterality: N/A;    . Cataract extraction w/ intraocular lens  implant, bilateral  april and may 2014  . Transthoracic echocardiogram  01-07-2012    mild to moderate dilated LV,  ef 45-50%,  mild basal hypokinesis,  grade I diastolic  dysfunction/  trivial AR/  mild MR  . Cardiovascular stress test  08-09-2009    mild global hypokinesis,  no ischemia or scar/  ef 41%  . Cystoscopy with urethral dilatation N/A 11/24/2014    Procedure: CYSTOSCOPY WITH URETHRAL DILATATION;  Surgeon: Crist Fat, MD;  Location: [facility];  Service: Urology;  Laterality: N/A;  BALLOON DILATION        . Cystoscopy with retrograde urethrogram N/A 11/24/2014    Procedure: CYSTOSCOPY WITH RETROGRADE URETHROGRAM;  Surgeon: Crist Fat, MD;  Location: [facility];  Service: Urology;  Laterality: N/A;    Allergies:  Novocain Prior to Admit Meds:   Prescriptions prior to admission  Medication Sig Dispense Refill Last Dose  . acetaminophen (TYLENOL) 500 MG tablet Take 1 tablet (500 mg total) by mouth every 6 (six) hours as needed. 30 tablet 0 unknown  . amLODipine (NORVASC) 10 MG tablet Take 1 tablet (10 mg total) by mouth daily. 90 tablet 1 01/13/2015 at Unknown time  . aspirin EC 81 MG tablet Take 81 mg by mouth daily.   01/13/2015 at Unknown  time  . atorvastatin (LIPITOR) 40 MG tablet Take 1 tablet (40 mg total) by mouth daily. 90 tablet 1 01/13/2015 at Unknown time  . finasteride (PROSCAR) 5 MG tablet Take 1 tablet (5 mg total) by mouth daily. 30 tablet 1 01/09/2015  . meclizine (ANTIVERT) 25 MG tablet Take 1 tablet (25 mg total) by mouth 3 (three) times daily as needed for dizziness. 30 tablet 1 unknown  . traMADol (ULTRAM) 50 MG tablet Take 1-2 tablets (50-100 mg total) by mouth every 8 (eight) hours as needed for moderate pain. 50 tablet 0 01/13/2015 at Unknown time  . gabapentin (NEURONTIN) 100 MG capsule Take 1 capsule (100 mg total) by mouth 3 (three) times daily. (Patient not  taking: Reported on 01/14/2015) 180 capsule 2 Not Taking at Unknown time  . metFORMIN (GLUCOPHAGE) 500 MG tablet Take 1 tablet (500 mg total) by mouth daily. (Patient not taking: Reported on 01/14/2015) 180 tablet 3 Not Taking at Unknown time  . omeprazole (PRILOSEC) 20 MG capsule Take 1 capsule (20 mg total) by mouth daily. (Patient not taking: Reported on 01/14/2015) 30 capsule 3 Not Taking at Unknown time  . phenazopyridine (PYRIDIUM) 200 MG tablet Take 1 tablet (200 mg total) by mouth 3 (three) times daily as needed for pain. (Patient not taking: Reported on 01/14/2015) 10 tablet 0 Not Taking at Unknown time  . tamsulosin (FLOMAX) 0.4 MG CAPS capsule Take 1 capsule (0.4 mg total) by mouth daily. (Patient not taking: Reported on 01/14/2015) 30 capsule 3 Not Taking at Unknown time   Fam HX:    Family History  Problem Relation Age of Onset  . Lung disease Brother   . Kidney disease Brother   . Heart disease Brother     died at 49  . Heart disease Mother   . Kidney disease Mother   . Lung disease Mother    Social HX:    Social History   Social History  . Marital Status: Married    Spouse Name: N/A  . Number of Children: 2   . Years of Education: N/A   Occupational History  .  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 [employer]     on the weekends   Social History Main Topics  . Smoking status: Former Smoker -- 0.50 packs/day for 40 years    Types: Cigarettes    Quit date: 01/29/2004  . Smokeless tobacco: Never Used  . Alcohol Use: No  . Drug Use: No  . Sexual Activity: Not Currently   Other Topics Concern  . Not on file   Social History Narrative     ROS:  All 11 ROS were addressed and are negative except what is stated in the HPI  Physical Exam: Blood pressure 141/90, pulse 62, temperature 97.7 F (36.5 C), temperature source Oral, resp. rate 11, height  (1.93 m), weight 185 lb 6.5 oz (84.1 kg), SpO2 100 %.    General: Well developed, well  nourished, in no acute distress Head: Eyes PERRLA, No xanthomas.   Normal cephalic and atramatic  Lungs:   Clear bilaterally to auscultation and percussion. Heart:  Irregularly irregular S1 S2 Pulses are 2+ & equal.            No carotid bruit. No JVD.  No abdominal bruits. No femoral bruits. Abdomen: Bowel sounds are positive, abdomen soft and non-tender without masses Extremities:   No clubbing, cyanosis or edema.  DP +1 Neuro: Alert and  oriented X 3. Psych:  Good affect, responds appropriately    Labs:   Lab Results  Component Value Date   WBC 7.5 01/16/2015   HGB 12.5* 01/16/2015   HCT 39.6 01/16/2015   MCV 86.8 01/16/2015   PLT 218 01/16/2015    Recent Labs Lab 01/14/15 1827  01/16/15 1350  NA 150*  < > 146*  K 6.2*  < > 4.3  CL 116*  < > 115*  CO2 20*  < > 17*  BUN 139*  < > 88*  CREATININE 7.48*  < > 4.99*  CALCIUM 9.7  < > 8.5*  PROT 9.7*  --   --   BILITOT 0.7  --   --   ALKPHOS 125  --   --   ALT 33  --   --   AST 29  --   --   GLUCOSE 135*  < > 131*  < > = values in this interval not displayed. No results found for: PTT Lab Results  Component Value Date   INR 1.07 08/20/2013   INR 1.06 02/23/2013   INR 1.0 06/23/2007   Lab Results  Component Value Date   CKTOTAL 274* 05/28/2013   CKMB 11.3* 06/23/2007   TROPONINI 0.04* 01/16/2015     Lab Results  Component Value Date   CHOL 150 09/14/2013   CHOL 191 12/10/2011   CHOL 201* 02/07/2009   Lab Results  Component Value Date   HDL 46 09/14/2013   HDL 48 12/10/2011   HDL 43 02/07/2009   Lab Results  Component Value Date   LDLCALC 89 09/14/2013   LDLCALC 129* 12/10/2011   LDLCALC 145* 02/07/2009   Lab Results  Component Value Date   TRIG 74 09/14/2013   TRIG 68 12/10/2011   TRIG 66 02/07/2009   Lab Results  Component Value Date   CHOLHDL 3.3 09/14/2013   CHOLHDL 4.0 12/10/2011   CHOLHDL 4.7 Ratio 02/07/2009   Lab Results  Component Value Date   LDLDIRECT 175* 04/09/2011    LDLDIRECT 138* 07/11/2009   LDLDIRECT 147* 11/27/2007      Radiology:  US Renal  01/15/2015  CLINICAL DATA:  Acute kidney injury. EXAM: RENAL / URINARY TRACT ULTRASOUND COMPLETE COMPARISON:  CT of the abdomen and pelvis 09/02/2012. FINDINGS: Right Kidney: Length: 12.6 cm, within normal limits. The right kidney is hyperechoic to the index organ, the liver. No mass or hydronephrosis visualized. Left Kidney: Length: 10.4 cm, within normal limits with. The left kidney is somewhat hyperechoic as well. No mass or hydronephrosis visualized. Bladder: A Foley catheter is present within the urinary bladder. IMPRESSION: The kidneys are hyperechoic bilaterally without focal mass lesion or hydronephrosis. This is nonspecific, but can be seen in the setting of medical renal disease. Electronically Signed   By: Marin Roberts M.D.   On: 01/15/2015 13:41   Dg Abd Acute W/chest  01/14/2015  CLINICAL DATA:  Vomiting, lower abdominal pain. History of hypertension, coronary artery disease, CVA, diabetes. Former smoker. EXAM: DG ABDOMEN ACUTE W/ 1V CHEST COMPARISON:  None. FINDINGS: Single view of the chest: Heart size is normal. Overall cardiomediastinal silhouette appears stable in size and configuration compared to an earlier chest x-ray of 10/02/2013. Lungs are clear. No pleural effusions seen. Osseous and soft tissue structures about the chest are unremarkable. Supine and decubitus views of the abdomen: Bowel gas pattern is nonobstructive. No evidence of soft tissue mass or abnormal fluid collection. No evidence of free intraperitoneal air. Vascular calcifications  noted within the lower abdomen and pelvis. No acute/ significant osseous abnormality seen. IMPRESSION: 1. Lungs are clear and there is no evidence of acute cardiopulmonary abnormality. 2. Nonobstructive bowel gas pattern and no evidence of acute intra-abdominal abnormality. Electronically Signed   By: Bary Richard M.D.   On: 01/14/2015 18:48    EKG:   #1 12/17 NSR with T wave inversions in lateral precordial leads            #2  12/19 Atrial flutter with RVR            #3  12/19 atrial fibrillation with RVR  ASSESSMENT/PLAN: 1.  New onset atrial flutter/fibrillation now with controlled rate on IV Cardizem gtt.  His CHADS2VASC score is 6 (HTN, age > 18, DM, CVA (2 points), DM) so needs long term anticoagulation.  He is not a candidate for NOAC due to CKD.  Recommend continuing IV Heparin gtt and start Coumadin.  By tele he was in NSR until 4am this am.  Will make NPO after MN for DCCV in the am since he has been in afib < 24 hours without anticoagulation.  2D echo with normal LVF.  Likely acute illness precipitated the afib.   2.  HTN 3.  ASCAD - he has been followed by [name] and Dr. Tenny Craw in the past and will need followup with them.     Quintella Reichert, MD  01/16/2015  6:08 PM

## 2015-01-16 NOTE — Progress Notes (Signed)
ANTICOAGULATION CONSULT NOTE - Follow Up Consult  Pharmacy Consult for Warfarin Indication: atrial fibrillation  Allergies  Allergen Reactions  . Novocain [Procaine Hcl] Nausea And Vomiting    Patient Measurements: Height: 6\' 4"  (193 cm) Weight: 185 lb 6.5 oz (84.1 kg) IBW/kg (Calculated) : 86.8 Heparin Dosing Weight: 84 kg  Vital Signs: Temp: 97.7 F (36.5 C) (12/19 1642) Temp Source: Oral (12/19 1642) BP: 141/90 mmHg (12/19 1642) Pulse Rate: 62 (12/19 1642)  Labs:  Recent Labs  01/14/15 1827 01/15/15 0501  01/15/15 2308 01/16/15 0520 01/16/15 1057 01/16/15 1350 01/16/15 1652  HGB 15.1 13.0  --   --  12.5*  --   --   --   HCT 46.7 41.9  --   --  39.6  --   --   --   PLT 329 250  --   --  218  --   --   --   HEPARINUNFRC  --   --   --   --   --   --  0.98*  --   CREATININE 7.48* 6.76*  < > 5.80* 5.43*  --  4.99*  --   TROPONINI  --   --   --   --  0.04* 0.03  --  0.04*  < > = values in this interval not displayed.  Estimated Creatinine Clearance: 17.1 mL/min (by C-G formula based on Cr of 4.99).   Medications:  Heparin @ 1000 units/hr (10 ml/hr)  Assessment: 69 YOM who continues on heparin for new-onset Afib while awaiting DCCV on 12/20. Pharmacy consulted to start warfarin this evening. Baseline INR 1.33, CBC okay. Warf points~6.   Goal of Therapy:  INR 2-3 Monitor platelets by anticoagulation protocol: Yes   Plan:  1. Warfarin 7.5 mg x 1 dose at 1800 today 2. Daily PT/INR 3. Will continue to monitor for any signs/symptoms of bleeding and will follow up with PT/INR in the a.m.   Georgina Pillion, PharmD, BCPS Clinical Pharmacist Pager: [phone] 01/16/2015 6:34 PM

## 2015-01-16 NOTE — Progress Notes (Signed)
Initial Nutrition Assessment  DOCUMENTATION CODES:   Severe malnutrition in context of acute illness/injury  INTERVENTION:  -Ensure Enlive po BID, each supplement provides 350 kcal and 20 grams of protein  NUTRITION DIAGNOSIS:   Inadequate oral intake related to poor appetite as evidenced by per patient/family report, percent weight loss.  GOAL:   Patient will meet greater than or equal to 90% of their needs  MONITOR:   Labs, I & O's, Supplement acceptance, PO intake  REASON FOR ASSESSMENT:   Malnutrition Screening Tool    ASSESSMENT:   Brian Burns is a 69 y.o. male presenting with nausea, vomiting and suprapubic pain. He reports progressive nausea, vomiting and pain over the past 3-4 days. Also reports dysuria without flank pain, fevers or chills. History of urethral stricture requiring dilation. He reports he was having difficulty with catheterizing himself at home. History of 2 previous strokes, denies residual deficits. Denies current chest pain, shortness of breath, abdominal pain, back pain, nausea or vomiting.   Pt was in Echo Lab during visit. Unable to complete Nutrition-Focused physical exam at this time.   Per chart, pt has 30#/14% severe wt loss in 2 months. Pt also reports nausea, vomiting, abdominal pain over the past 4 days.   This likely resulted in extremely poor intake over the past week.  Indicates severe malnutrition in the context of acute illness related to nausea,vomitting,ab pain, as evidenced by < or equal to 50% energy intake over the past 5 days, and #30#/14% severe wt loss in 2 months.  Labs and Medications reviewed.   Diet Order:  Diet heart healthy/carb modified Room service appropriate?: Yes; Fluid consistency:: Thin  Skin:  Reviewed, no issues  Last BM:  01/14/2015  Height:   Ht Readings from Last 1 Encounters:  01/15/15 6\' 4"  (1.93 m)    Weight:   Wt Readings from Last 1 Encounters:  01/16/15 185 lb 6.5 oz (84.1 kg)     Ideal Body Weight:  91.81 kg  BMI:  Body mass index is 22.58 kg/(m^2).  Estimated Nutritional Needs:   Kcal:  2100-2500  Protein:  80-95 grams  Fluid:  >/= 2.1L  EDUCATION NEEDS:   No education needs identified at this time  Dionne Ano. Cornelio Parkerson, MS, RD LDN After Hours/Weekend Pager [phone]

## 2015-01-16 NOTE — Progress Notes (Signed)
S: CO epigastric pain O:BP 107/87 mmHg  Pulse 74  Temp(Src) 98 F (36.7 C) (Oral)  Resp 12  Ht 6\' 4"  (1.93 m)  Wt 84.1 kg (185 lb 6.5 oz)  BMI 22.58 kg/m2  SpO2 94%  Intake/Output Summary (Last 24 hours) at 01/16/15 0800 Last data filed at 01/16/15 0428  Gross per 24 hour  Intake 3396.67 ml  Output   2050 ml  Net 1346.67 ml   Weight change: 3.4 kg (7 lb 7.9 oz) QGB:EEFEO and alert FHQ:RFXJO,ITGPQ Resp:clear Abd:+ BS NTND no HSM Ext:no edema NEURO:CNI Ox3 no asterixis   . amLODipine  10 mg Oral Daily  . aspirin EC  81 mg Oral Daily  . atorvastatin  40 mg Oral q1800  . cefTRIAXone (ROCEPHIN)  IV  1 g Intravenous Q24H  . Chlorhexidine Gluconate Cloth  6 each Topical Q0600  . finasteride  5 mg Oral Daily  . Influenza vac split quadrivalent PF  0.5 mL Intramuscular Tomorrow-1000  . insulin aspart  0-9 Units Subcutaneous TID WC  . mupirocin ointment  1 application Nasal BID  . sodium chloride  3 mL Intravenous Q12H  . tamsulosin  0.4 mg Oral QHS   US Renal  01/15/2015  CLINICAL DATA:  Acute kidney injury. EXAM: RENAL / URINARY TRACT ULTRASOUND COMPLETE COMPARISON:  CT of the abdomen and pelvis 09/02/2012. FINDINGS: Right Kidney: Length: 12.6 cm, within normal limits. The right kidney is hyperechoic to the index organ, the liver. No mass or hydronephrosis visualized. Left Kidney: Length: 10.4 cm, within normal limits with. The left kidney is somewhat hyperechoic as well. No mass or hydronephrosis visualized. Bladder: A Foley catheter is present within the urinary bladder. IMPRESSION: The kidneys are hyperechoic bilaterally without focal mass lesion or hydronephrosis. This is nonspecific, but can be seen in the setting of medical renal disease. Electronically Signed   By: Marin Roberts M.D.   On: 01/15/2015 13:41   Dg Abd Acute W/chest  01/14/2015  CLINICAL DATA:  Vomiting, lower abdominal pain. History of hypertension, coronary artery disease, CVA, diabetes. Former  smoker. EXAM: DG ABDOMEN ACUTE W/ 1V CHEST COMPARISON:  None. FINDINGS: Single view of the chest: Heart size is normal. Overall cardiomediastinal silhouette appears stable in size and configuration compared to an earlier chest x-ray of 10/02/2013. Lungs are clear. No pleural effusions seen. Osseous and soft tissue structures about the chest are unremarkable. Supine and decubitus views of the abdomen: Bowel gas pattern is nonobstructive. No evidence of soft tissue mass or abnormal fluid collection. No evidence of free intraperitoneal air. Vascular calcifications noted within the lower abdomen and pelvis. No acute/ significant osseous abnormality seen. IMPRESSION: 1. Lungs are clear and there is no evidence of acute cardiopulmonary abnormality. 2. Nonobstructive bowel gas pattern and no evidence of acute intra-abdominal abnormality. Electronically Signed   By: Bary Richard M.D.   On: 01/14/2015 18:48   BMET    Component Value Date/Time   NA 146* 01/16/2015 0520   K 4.3 01/16/2015 0520   CL 115* 01/16/2015 0520   CO2 20* 01/16/2015 0520   GLUCOSE 162* 01/16/2015 0520   BUN 93* 01/16/2015 0520   CREATININE 5.43* 01/16/2015 0520   CREATININE 1.30 03/18/2014 1234   CALCIUM 8.4* 01/16/2015 0520   GFRNONAA 10* 01/16/2015 0520   GFRNONAA 71 05/28/2013 1442   GFRAA 11* 01/16/2015 0520   GFRAA 82 05/28/2013 1442   CBC    Component Value Date/Time   WBC 7.5 01/16/2015 0520   RBC 4.56  01/16/2015 0520   HGB 12.5* 01/16/2015 0520   HCT 39.6 01/16/2015 0520   PLT 218 01/16/2015 0520   MCV 86.8 01/16/2015 0520   MCH 27.4 01/16/2015 0520   MCHC 31.6 01/16/2015 0520   RDW 15.7* 01/16/2015 0520   LYMPHSABS 1.4 01/14/2015 1827   MONOABS 0.9 01/14/2015 1827   EOSABS 0.0 01/14/2015 1827   BASOSABS 0.0 01/14/2015 1827     Assessment: 1. Acute on CKD 3 most likely secondary to obstruction, at least partial.  Scr improving with placement of foley despite [place] not showing hydro.  Suspect some volume  depletion also playing a role. 2. HTN 3. Urethral stricture, SP dilation 10/16 4. HyperNa, improved 5. HyperK resolved 6. A fib  On heparin and cardizem 7. ? UTI  Plan: 1. Daily Scr 2. Decrease rate of IV fluids and increase PO intake 3. Leave foley in for now 4. Rec starting H2 blocker or PPI   Glendon Fiser T

## 2015-01-16 NOTE — Progress Notes (Signed)
Family Medicine Teaching Service Daily Progress Note Intern Pager: [phone]  Patient name: Brian Burns Medical record number: [id number] Date of birth: 1945-11-05 Age: 70 y.o. Gender: male  Primary Care Provider: Shirlee Latch, MD Consultants: Renal Code Status: FULL  Pt Overview and Major Events to Date:  12/18: admit for acute on chronic renal failure secondary to obstruction 12/19: new-onset a.fib with RVR 12/20: DCCM for a.fib   Assessment and Plan:  Brian Burns is a 69 y.o. male presenting with acute on chronic renal failure secondary to obstruction. PMH is significant for urethral strictures, CAD, HTN, CVA 2, BPH, DM 2.   Acute on chronic renal failure secondary to obstruction: In stepdown for close cardiac, BP, and electrolyte monitoring with postobstructive diuresis. Urinary catheter in place. Cr 7.48 on admission, mag 3.5. Renal US showed hyperechoic kidneys bilaterally with no hydronephrosis or lesions. Nephrology consulted, agreed with bladder outlet obstruction as most likely cause and suggested changes in IVF. Cr improved to 4.64 this AM. Urology consulted yesterday, who recommended no immediate intervention and continued use of Foley catheter.  - Strict I&Os - Cont IVF at decreased rate per nephro recs. Encourage PO intake.  - Continue finasteride 5 mg, Flomax daily - Outpatient urology follow-up after discharge  New-onset atrial fibrillation with RVR: Pt with HR in 160s-170s overnight, found to be in a.fib with RVR on EKG. Diltiazem drip was started, and patient was also transitioned to prophylactic to therapeutic heparin dose. Trop pos x3 (0.04). Pt seen by cardiology yesterday, who recommended continuing heparin gtt and beginning coumadin bridge. Cards will perform DCCV today since pt has been in a.fib <24 hours without anticoagulation. Per cards, suspect a.fib precipitated by acute illness. HR stabilized around 60 overnight on dilt drip. Heparin stopped  overnight per pharmacy due to bleeding, but coumadin continued.  - Cont dilt drip - DCCV this AM - Coumadin started last night - continue - Will need cardiology follow-up outpatient after discharge  Ab pain / Dysuria: Lipase improved to 169. CTX discontinued given Staph aureus on urine culture. Staph aureus most likely introduced by instrumentation given frequent catheterization, however blood cultures were obtained to rule out bacterial seeding from another source (eg endocarditis). Abd US performed out of concern for pancreatitis given pain and increased lipase. US showed gallbladder sludge with medical renal disease and mild distal AAA but pancreas was not visualized.  - Continue doxycycline (12/19>>) - F/u blood cultures   Hiccups: Intractable x2 days. Potentially due to diaphragmatic irritation by pancreatitis given increased lipase.  - Discontinue Reglan - Begin Haldol 0.5 mg PO q6 for four doses  Hyperkalemia: Resolved, K now improved to 4.1 (12/20), Potassium 6.2 (ED).EKG without acute changes.S/p Kayexalate and insulin in ED. - Monitor renal panel   Hypernatremia. Likely due to dehydration with vomiting and poor by mouth intake. Improved to 144 (12/20).  - Monitor Renal panel - IVF as below  Cardiac: CAD, HTN, PAD, HF, AAA (~ 2.94x3.14cm). Echo 12/19 with low normal LVF and EF 50-55%.  - Restart home Norvasc 10 mg daily - Monitor for hypotension if develops postobstructive diuresis  Type II Diabetes mellitus: A1C 6.8 (12/16). - Holding Metformin - SSI sensitive AC  CVA - Continue home: ASA 81 mg daily, Lipitor 40 mg daily at bedtime  FEN/GI: Heart healthy/carb mod diet; D5W  mL/hr per renal rec Prophylaxis: coumadin (heparin stopped due to bleeding)  Disposition: Monitor in stepdown; discharge pending improvement of renal failure and stabilization of electrolytes  Subjective:  Patient had episodes of bleeding from penis and from IV site overnight. Heparin dose was  decreased, but patient had continued bleeding, so, per pharmacy rec, heparin was discontinued.  Pt endorses mild chest pain this AM, but denies palpitations or heart racing. Endorses mild SOB. Has no other complaints.  Patient again today reports he is cold, but does not have chills.   Objective: Temp:  [97.5 F (36.4 C)-98.6 F (37 C)] 98.6 F (37 C) (12/20 0809) Pulse Rate:  [57-67] 63 (12/20 1000) Resp:  [11-16] 16 (12/20 1000) BP: (141-157)/(84-94) 151/93 mmHg (12/20 1009) SpO2:  [99 %-100 %] 99 % (12/20 1000) Weight:  [188 lb 11.4 oz (85.6 kg)] 188 lb 11.4 oz (85.6 kg) (12/20 0500) Physical Exam: General: lying in bed in NAD, hiccupping throughout encounter Cardiovascular: distant heart sounds but regular rhythm, no murmurs appreciated Respiratory: CTAB, no wheezes, normal WOB Abdomen: soft, non-tender, non-distended, +BS Neuro: alert and oriented, no focal deficits Psych: appropriate mood and affect   Laboratory:  Recent Labs Lab 01/15/15 0501 01/16/15 0520 01/17/15 0321  WBC 12.8* 7.5 8.4  HGB 13.0 12.5* 12.0*  HCT 41.9 39.6 37.7*  PLT 250 218 211    Recent Labs Lab 01/14/15 1827  01/16/15 0520 01/16/15 1350 01/17/15 0321  NA 150*  < > 146* 146* 144  K 6.2*  < > 4.3 4.3 4.1  CL 116*  < > 115* 115* 114*  CO2 20*  < > 20* 17* 20*  BUN 139*  < > 93* 88* 75*  CREATININE 7.48*  < > 5.43* 4.99* 4.64*  CALCIUM 9.7  < > 8.4* 8.5* 8.6*  PROT 9.7*  --   --   --   --   BILITOT 0.7  --   --   --   --   ALKPHOS 125  --   --   --   --   ALT 33  --   --   --   --   AST 29  --   --   --   --   GLUCOSE 135*  < > 162* 131* 128*  < > = values in this interval not displayed.  Imaging: US Renal  01/15/2015  CLINICAL DATA:  Acute kidney injury. EXAM: RENAL / URINARY TRACT ULTRASOUND COMPLETE COMPARISON:  CT of the abdomen and pelvis 09/02/2012. FINDINGS: Right Kidney: Length: 12.6 cm, within normal limits. The right kidney is hyperechoic to the index organ, the liver. No  mass or hydronephrosis visualized. Left Kidney: Length: 10.4 cm, within normal limits with. The left kidney is somewhat hyperechoic as well. No mass or hydronephrosis visualized. Bladder: A Foley catheter is present within the urinary bladder. IMPRESSION: The kidneys are hyperechoic bilaterally without focal mass lesion or hydronephrosis. This is nonspecific, but can be seen in the setting of medical renal disease. Electronically Signed   By: Marin Roberts M.D.   On: 01/15/2015 13:41   Dg Abd Acute W/chest  01/14/2015  CLINICAL DATA:  Vomiting, lower abdominal pain. History of hypertension, coronary artery disease, CVA, diabetes. Former smoker. EXAM: DG ABDOMEN ACUTE W/ 1V CHEST COMPARISON:  None. FINDINGS: Single view of the chest: Heart size is normal. Overall cardiomediastinal silhouette appears stable in size and configuration compared to an earlier chest x-ray of 10/02/2013. Lungs are clear. No pleural effusions seen. Osseous and soft tissue structures about the chest are unremarkable. Supine and decubitus views of the abdomen: Bowel gas pattern is nonobstructive. No evidence of soft tissue mass or  abnormal fluid collection. No evidence of free intraperitoneal air. Vascular calcifications noted within the lower abdomen and pelvis. No acute/ significant osseous abnormality seen. IMPRESSION: 1. Lungs are clear and there is no evidence of acute cardiopulmonary abnormality. 2. Nonobstructive bowel gas pattern and no evidence of acute intra-abdominal abnormality. Electronically Signed   By: Bary Richard M.D.   On: 01/14/2015 18:48    Aleisha Paone Shelbie Hutching, MD 01/17/2015, 12:23 PM PGY-1, [facility] Family Medicine FPTS Intern pager: [phone], text pages welcome

## 2015-01-16 NOTE — Progress Notes (Signed)
ANTICOAGULATION CONSULT NOTE  Pharmacy Consult for heparin Indication: Aflutter  Allergies  Allergen Reactions  . Novocain [Procaine Hcl] Nausea And Vomiting    Patient Measurements: Height: 6\' 4"  (193 cm) Weight: 185 lb 6.5 oz (84.1 kg) IBW/kg (Calculated) : 86.8  Vital Signs: Temp: 97.9 F (36.6 C) (12/19 1216) Temp Source: Oral (12/19 1216) BP: 139/99 mmHg (12/19 1216) Pulse Rate: 68 (12/19 1216)  Labs:  Recent Labs  01/14/15 1827 01/15/15 0501  01/15/15 2308 01/16/15 0520 01/16/15 1057 01/16/15 1350  HGB 15.1 13.0  --   --  12.5*  --   --   HCT 46.7 41.9  --   --  39.6  --   --   PLT 329 250  --   --  218  --   --   HEPARINUNFRC  --   --   --   --   --   --  0.98*  CREATININE 7.48* 6.76*  < > 5.80* 5.43*  --  4.99*  TROPONINI  --   --   --   --  0.04* 0.03  --   < > = values in this interval not displayed.  Estimated Creatinine Clearance: 17.1 mL/min (by C-G formula based on Cr of 4.99).  Assessment: 69yo male admitted yesterday for acute on chronic renal failure, hyperkalemia, and hypernatremia, went into Aflutter this morning, started on dilt and heparin.  First heparin level elevated at 0.98 units/mL. No bleeding or oozing per RN Joni Reining.   Hgb 12.5, plts 218.  Goal of Therapy:  Heparin level 0.3-0.7 units/ml Monitor platelets by anticoagulation protocol: Yes   Plan:  -decrease heparin to 1000 units/hr -heparin level in 8h -daily HL and CBC  Farmer Mccahill D. Zaleigh Bermingham, PharmD, BCPS Clinical Pharmacist Pager: [phone] 01/16/2015 3:08 PM

## 2015-01-16 NOTE — Progress Notes (Signed)
FPTS Interim Progress Note  S: Called by nursing regarding patient's elevated HR to the 160s-170s. EKG was performed and concerning for Atrial Fib. Went to see patient and he was resting comfortably in bed. Denied chest pain or history of abnormal heart beat. Denied SOB.   O: BP 134/90 mmHg  Pulse 53  Temp(Src) 98 F (36.7 C) (Oral)  Resp 16  Ht 6\' 4"  (1.93 m)  Wt 185 lb 6.5 oz (84.1 kg)  BMI 22.58 kg/m2  SpO2 100%  Gen: elderly male lying in bed in NAD Heart: Tachycardic irregularly irregular  Lungs: CTAB  A/P: 69 yo male with Acute on CKD 2/2 to obstruction with new onset Atrial Fib. Unclear cause of new onset AFib. Patient came in with electrolyte abnormalities which are improving. Last K+ WNL at 4.5. Urine output has been over 2L in past 24 hours but is net positive, so does not appear to be 2/2 to dehydration. -Diltiazem x1 given with some improvement of HR to 110s, then increased to 130s  -Discussed case with Dr. Donnie Aho cardiologist, who recommended placing pt on diltiazem drip  -Transitioned from heparin prophylaxis to therapeutic dose  -Ordered Troponin x1 -echo ordered for later today   Arvilla Market, DO 01/16/2015, 5:32 AM PGY-1, [facility] Family Medicine Service pager [phone]

## 2015-01-16 NOTE — Discharge Summary (Signed)
Family Medicine Teaching [facility] Discharge Summary  Patient name: Brian Burns Medical record number: [id number] Date of birth: [birth year] Age: 69 y.o. Gender: male Date of Admission: 01/14/2015  Date of Discharge: 01/20/15 Admitting Physician: Doreene Eland, MD  Primary Care Provider: Shirlee Latch, MD Consultants: urology, cardiology, nephrology  Indication for Hospitalization: acute on chronic renal failure  Discharge Diagnoses/Problem List:  Patient Active Problem List   Diagnosis Date Noted  . Bladder outlet obstruction 01/20/2015  . Balance problem   . Idiopathic acute pancreatitis   . UTI (lower urinary tract infection)   . Staphylococcal infection 01/17/2015  . Protein-calorie malnutrition, severe 01/16/2015  . Atrial flutter with rapid ventricular response (HCC) 01/16/2015  . Abdominal pain   . Intractable hiccups   . Renal failure (ARF), acute on chronic (HCC) 01/15/2015  . AKI (acute kidney injury) (HCC)   . Hyperkalemia   . Hypernatremia   . Catheter-associated urinary tract infection (HCC)   . Renal failure 01/14/2015  . Actinic keratosis 10/07/2014  . Bilateral wrist pain 09/09/2014  . Food impaction of esophagus 06/01/2014  . Decreased visual acuity 04/08/2014  . Acute cystitis with positive culture 03/30/2014  . Dysuria 03/18/2014  . Polyuria 03/18/2014  . Loss of weight 03/18/2014  . Callus of foot 12/02/2013  . GERD (gastroesophageal reflux disease) 10/21/2013  . Chest congestion 10/21/2013  . Right lower lobe pneumonia 10/11/2013  . Constipation 10/11/2013  . Type 2 diabetes mellitus (HCC) 09/14/2013  . AAA (abdominal aortic aneurysm) without rupture (HCC) 06/24/2013  . PVD (peripheral vascular disease) (HCC) 06/24/2013  . Throat discomfort 04/09/2013  . Vertigo 03/18/2013  . Hearing loss of both ears 03/18/2013  . BPH (benign prostatic hyperplasia) 02/16/2013  . Urethral stricture 02/16/2013  . Healthcare maintenance 02/16/2013   . Other malaise and fatigue 07/17/2012  . Pain in joint, pelvic region and thigh 06/18/2012  . Back pain 06/18/2012  . Peripheral vascular disease, unspecified (HCC) 06/18/2012  . Hematuria 06/11/2012  . Neuropathic pain of both legs 05/10/2012  . CVA (cerebral infarction) 01/10/2012  . TIA (transient ischemic attack) 01/08/2012  . Knee pain, bilateral 04/09/2011  . Urinary frequency 04/09/2011  . Carotid artery calcification 07/20/2009  . PVD WITH CLAUDICATION 07/20/2009  . BACK PAIN, LUMBAR, CHRONIC 11/27/2007  . Hyperlipidemia 02/05/2007  . Overweight(278.02) 06/04/2006  . HYPERTENSION, BENIGN SYSTEMIC 03/27/2006  . Coronary atherosclerosis 03/27/2006  . IMPOTENCE, ORGANIC 03/27/2006   Disposition: home  Discharge Condition: stable  Discharge Exam:  General: lying in bed in NAD Cardiovascular: RRR, no murmurs appreciated Respiratory: CTAB, no wheezes, normal WOB Abdomen: soft, non-tender, non-distended, +BS Neuro: alert and oriented, no focal deficits Psych: appropriate mood and affect   Brief Hospital Course:  Patient presented to ED after 3-4 days of nausea, vomiting, and suprapubic pain. He had a history of recent urethral stricture dilatation, and was having difficulty with self-catheterization at home for a few days prior to discharge. He was found to have a creatinine of 7.48 in the ED, and was subsequently admitted for acute on chronic renal failure secondary to presumed bladder outlet obstruction.   Acute on chronic renal failure secondary to bladder outlet obstruction Patient was started on CTX, a new Foley catheter was placed, and IVF resuscitation was started. His renal function quickly improved, with decrease in Cr from 7.48 on admission to 3.07 at discharge. Nephrology was consulted, who agreed that bladder outlet obstruction was most likely cause of renal failure. They also recommended renal US, which showed  hyperechoic kidneys bilaterally without hydronephrosis  or lesions. Urology was consulted regarding cause of obstruction. They had no immediate recommendations, and suggested continuing the Foley and following up outpatient after discharge.   New-onset atrial fibrillation with RVR  On second day of admission, patient became tachycardic with HR in 170s, and was found to be in new-onset a.fib with RVR on EKG. He was started on a diltiazem drip with good response. Echo was performed which showed mild LVH with an EF of 50%. Cardiology was consulted, who recommended starting coumadin and performing DCCM, however patient spontaneously converted back to NSR and maintained appropriate rate and rhythm, so cardioversion was not performed. Diltiazem was then discontinued.   Abdominal pain/dysuria Urine cultures were collected on admission due to patient's suprapubic pain and dysuria. Pt was also started on ceftriaxone. Urine cultures grew Staph aureus, so blood cultures were drawn to rule out bacterial seeding from another source. CTX was discontinued and doxycycline was started for better S.aureus coverage. Blood cultures with no growth at time of discharge.     Issues for Follow Up:  1. Patient to follow-up with urology outpatient concerning cause of bladder outlet obstruction.  2. Mild distal abdominal aortic aneurysm (32x33 mm) seen on abd [place]. Unchanged in size since visualization in 2013. Per radiology, recommend f/u by repeat US in 3 years.  3. Per pharmacy, patient to hold warfarin dose on day of discharge, and resume warfarin on 12/24. Please ensure patient did resume warfarin as instructed.  4. Please recheck INR at hosp follow-up on 12/27.  5. Patient with remainder of 10 day course of doxycycline for complicated UTI - last dose 12/28.  6. Patient should follow-up with cardiology outpatient to monitor for possible recurrence of a.fib.  Significant Procedures: none  Significant Labs and Imaging:   Recent Labs Lab 01/18/15 0439 01/19/15 0655  01/20/15 0604  WBC 8.0 8.8 8.5  HGB 11.6* 12.0* 11.4*  HCT 35.4* 36.1* 34.7*  PLT 194 176 178    Recent Labs Lab 01/14/15 1827 01/15/15 0501  01/15/15 2308 01/16/15 0520 01/16/15 1350 01/17/15 0321 01/18/15 0439 01/19/15 0805 01/20/15 0604  NA 150* 149*  < > 148* 146* 146* 144 139 140 140  K 6.2* 4.9  < > 4.5 4.3 4.3 4.1 4.0 4.2 4.2  CL 116* 120*  < > 118* 115* 115* 114* 111 109 111  CO2 20* 17*  < > 21* 20* 17* 20* 20* 22 19*  GLUCOSE 135* 216*  < > 155* 162* 131* 128* 137* 126* 115*  BUN 139* 126*  < > 105* 93* 88* 75* 56* 45* 47*  CREATININE 7.48* 6.76*  < > 5.80* 5.43* 4.99* 4.64* 3.75* 3.31* 3.07*  CALCIUM 9.7 8.8*  < > 8.5* 8.4* 8.5* 8.6* 8.6* 8.7* 8.5*  MG  --  3.5*  --   --   --   --   --   --   --   --   PHOS  --  6.3*  < > 5.1* 4.9* 4.7* 4.6 4.5  --   --   ALKPHOS 125  --   --   --   --   --   --   --   --   --   AST 29  --   --   --   --   --   --   --   --   --   ALT 33  --   --   --   --   --   --   --   --   --  ALBUMIN 4.6 3.4*  < > 3.4* 3.3* 3.6 3.4* 3.2*  --   --   < > = values in this interval not displayed.  Renal US (12/18) - hyperechoic kidneys bilaterally with no hydronephrosis or lesions Abdominal US (12/19) - gallbladder sludge, mild distal abdominal aortic aneurysm  Results/Tests Pending at Time of Discharge: blood cultures (no growth x4 days at discharge)  Discharge Medications:    Medication List    TAKE these medications        acetaminophen 500 MG tablet  Commonly known as:  TYLENOL  Take 1 tablet (500 mg total) by mouth every 6 (six) hours as needed.     amLODipine 10 MG tablet  Commonly known as:  NORVASC  Take 1 tablet (10 mg total) by mouth daily.     aspirin EC 81 MG tablet  Take 81 mg by mouth daily.     atorvastatin 40 MG tablet  Commonly known as:  LIPITOR  Take 1 tablet (40 mg total) by mouth daily.     doxycycline 100 MG tablet  Commonly known as:  VIBRA-TABS  Take 1 tablet (100 mg total) by mouth every 12 (twelve)  hours.     finasteride 5 MG tablet  Commonly known as:  PROSCAR  Take 1 tablet (5 mg total) by mouth daily.     gabapentin 100 MG capsule  Commonly known as:  NEURONTIN  Take 1 capsule (100 mg total) by mouth 3 (three) times daily.     meclizine 25 MG tablet  Commonly known as:  ANTIVERT  Take 1 tablet (25 mg total) by mouth 3 (three) times daily as needed for dizziness.     metFORMIN 500 MG tablet  Commonly known as:  GLUCOPHAGE  Take 1 tablet (500 mg total) by mouth daily.     omeprazole 20 MG capsule  Commonly known as:  PRILOSEC  Take 1 capsule (20 mg total) by mouth daily.     phenazopyridine 200 MG tablet  Commonly known as:  PYRIDIUM  Take 1 tablet (200 mg total) by mouth 3 (three) times daily as needed for pain.     tamsulosin 0.4 MG Caps capsule  Commonly known as:  FLOMAX  Take 1 capsule (0.4 mg total) by mouth daily.     traMADol 50 MG tablet  Commonly known as:  ULTRAM  Take 1-2 tablets (50-100 mg total) by mouth every 8 (eight) hours as needed for moderate pain.     warfarin 2.5 MG tablet  Commonly known as:  COUMADIN  Take 1 tablet (2.5 mg total) by mouth daily. Starting 12/24, then as directed from INR check on 12/27        Discharge Instructions: Please refer to Patient Instructions section of EMR for full details.  Patient was counseled important signs and symptoms that should prompt return to medical care, changes in medications, dietary instructions, activity restrictions, and follow up appointments.   Follow-Up Appointments: Follow-up Information    Follow up with Almon Hercules, MD. Go on 01/24/2015.   Specialty:  Family Medicine   Why:   for hospital follow-up   Contact information:   [address] [place] [address] [phone]       Follow up with Crist Fat, MD. Go on 02/02/2015.   Specialty:  Urology   Why:   for urology follow-up   Contact information:   [address] [place] [address] [phone]        Cammy Copa  Shelbie Hutching, MD 01/20/2015, 11:12 PM PGY-1, [facility] Family Medicine

## 2015-01-16 NOTE — Progress Notes (Signed)
Family Medicine Teaching Service Daily Progress Note Intern Pager: [phone]  Patient name: Brian Burns Medical record number: [id number] Date of birth: [birth year] Age: 69 y.o. Gender: male  Primary Care Provider: Shirlee Latch, MD Consultants: Renal Code Status: FULL  Pt Overview and Major Events to Date:  12/18: admit for acute on chronic renal failure secondary to obstruction  Assessment and Plan:  Brian Burns is a 69 y.o. male presenting with acute on chronic renal failure secondary to obstruction. PMH is significant for urethral strictures, CAD, HTN, CVA 2, BPH, DM 2.   Acute on chronic renal failure secondary to obstruction: In stepdown for close cardiac, BP, and electrolyte monitoring with postobstructive diuresis. Urinary catheter in place. Cr improving (7.48>> 6.76>>5.43), mag 3.5. Renal US showed hyperechoic kidneys bilaterally with no hydronephrosis or lesions. Nephrology consulted, agreed with bladder outlet obstruction as most likely cause and suggested changes in IVF. - F/u renal panel this afternoon then tomorrow AM - Strict I&Os - Cont IVF at decreased rate per nephro recs. Encourage PO intake.  - Consider urology consult - Continue finasteride 5 mg, Flomax daily - Hold Flomax   New-onset atrial fibrillation with RVR: Pt with HR in 160s-170s overnight, found to be in a.fib with RVR on EKG. Cardiology was consulted who started diltiazem drip. Pt also transitioned to prophylactic to therapeutic heparin dose. Trop pos x1 (0.04). HR improved this AM (HR 53-85). - F/u echo - Cont trend troponins  - Cont dilt drip  Ab pain / Dysuria: Lipase improved to 169. CTX discontinued given Staph aureus on urine culture.  - Obtain blood culture then begin doxy  Hyperkalemia: Resolved, K now 4.3, Potassium 6.2 (ED). EKG without acute changes.S/p Kayexalate and insulin in ED. - Monitor renal panel   Hypernatremia. Likely due to dehydration with vomiting and poor by mouth  intake. Improved to 146 (12/19).  - Monitor Renal panel - IVF as below  Cardiac: CAD, HTN, PAD, HF, AAA (~ 2.94x3.14cm). Echo 12/2011: EF 45% with mild diffuse hypokinesis and G1DD.  - Restart home Norvasc 10 mg daily - F/u echo today - Hold Flomax - Monitor for hypotension if develops postobstructive diuresis  Type II Diabetes mellitus: A1C 6.8 (12/16). - Holding Metformin - SSI sensitive AC  CVA - Continue home: ASA 81 mg daily, Lipitor 40 mg daily at bedtime  FEN/GI: Heart healthy/carb mod diet; D5W  mL/hr per renal rec Prophylaxis: SubQ therapeutic dose heparin  Disposition: Monitor in stepdown; discharge pending improvement of renal failure and stabilization of electrolytes  Subjective:  Pt reporting chills this AM but no other complaints. Denies chest pain, difficulty breathing. Patient with HR in 70s throughout encounter.   Objective: Temp:  [96.6 F (35.9 C)-98.5 F (36.9 C)] 97.9 F (36.6 C) (12/19 1216) Pulse Rate:  [53-88] 68 (12/19 1216) Resp:  [11-21] 15 (12/19 1216) BP: (107-156)/(87-115) 139/99 mmHg (12/19 1216) SpO2:  [93 %-100 %] 95 % (12/19 1216) Weight:  [185 lb 6.5 oz (84.1 kg)] 185 lb 6.5 oz (84.1 kg) (12/19 0113) Physical Exam: General: lying in bed in NAD Cardiovascular: RRR, no murmurs appreciated Respiratory: CTAB, no wheezes, normal WOB Abdomen: soft, non-tender, non-distended, +BS  Laboratory:  Recent Labs Lab 01/14/15 1827 01/15/15 0501 01/16/15 0520  WBC 14.5* 12.8* 7.5  HGB 15.1 13.0 12.5*  HCT 46.7 41.9 39.6  PLT 329 250 218    Recent Labs Lab 01/14/15 1827  01/15/15 1654 01/15/15 2308 01/16/15 0520  NA 150*  < > 151* 148* 146*  K 6.2*  < > 4.7 4.5 4.3  CL 116*  < > 120* 118* 115*  CO2 20*  < > 20* 21* 20*  BUN 139*  < > 115* 105* 93*  CREATININE 7.48*  < > 6.13* 5.80* 5.43*  CALCIUM 9.7  < > 8.6* 8.5* 8.4*  PROT 9.7*  --   --   --   --   BILITOT 0.7  --   --   --   --   ALKPHOS 125  --   --   --   --   ALT 33   --   --   --   --   AST 29  --   --   --   --   GLUCOSE 135*  < > 174* 155* 162*  < > = values in this interval not displayed.  Imaging: US Renal  01/15/2015  CLINICAL DATA:  Acute kidney injury. EXAM: RENAL / URINARY TRACT ULTRASOUND COMPLETE COMPARISON:  CT of the abdomen and pelvis 09/02/2012. FINDINGS: Right Kidney: Length: 12.6 cm, within normal limits. The right kidney is hyperechoic to the index organ, the liver. No mass or hydronephrosis visualized. Left Kidney: Length: 10.4 cm, within normal limits with. The left kidney is somewhat hyperechoic as well. No mass or hydronephrosis visualized. Bladder: A Foley catheter is present within the urinary bladder. IMPRESSION: The kidneys are hyperechoic bilaterally without focal mass lesion or hydronephrosis. This is nonspecific, but can be seen in the setting of medical renal disease. Electronically Signed   By: Marin Roberts M.D.   On: 01/15/2015 13:41   Dg Abd Acute W/chest  01/14/2015  CLINICAL DATA:  Vomiting, lower abdominal pain. History of hypertension, coronary artery disease, CVA, diabetes. Former smoker. EXAM: DG ABDOMEN ACUTE W/ 1V CHEST COMPARISON:  None. FINDINGS: Single view of the chest: Heart size is normal. Overall cardiomediastinal silhouette appears stable in size and configuration compared to an earlier chest x-ray of 10/02/2013. Lungs are clear. No pleural effusions seen. Osseous and soft tissue structures about the chest are unremarkable. Supine and decubitus views of the abdomen: Bowel gas pattern is nonobstructive. No evidence of soft tissue mass or abnormal fluid collection. No evidence of free intraperitoneal air. Vascular calcifications noted within the lower abdomen and pelvis. No acute/ significant osseous abnormality seen. IMPRESSION: 1. Lungs are clear and there is no evidence of acute cardiopulmonary abnormality. 2. Nonobstructive bowel gas pattern and no evidence of acute intra-abdominal abnormality. Electronically  Signed   By: Bary Richard M.D.   On: 01/14/2015 18:48    Tydarius Yawn Shelbie Hutching, MD 01/16/2015, 1:19 PM PGY-1, [facility] Family Medicine FPTS Intern pager: [phone], text pages welcome

## 2015-01-16 NOTE — Progress Notes (Signed)
ANTICOAGULATION CONSULT NOTE - Initial Consult  Pharmacy Consult for heparin Indication: Aflutter  Allergies  Allergen Reactions  . Novocain [Procaine Hcl] Nausea And Vomiting    Patient Measurements: Height: 6\' 4"  (193 cm) Weight: 185 lb 6.5 oz (84.1 kg) IBW/kg (Calculated) : 86.8  Vital Signs: Temp: 98 F (36.7 C) (12/19 0425) Temp Source: Oral (12/19 0425) BP: 134/90 mmHg (12/19 0425) Pulse Rate: 53 (12/19 0425)  Labs:  Recent Labs  01/14/15 1827 01/15/15 0501 01/15/15 1113 01/15/15 1654 01/15/15 2308  HGB 15.1 13.0  --   --   --   HCT 46.7 41.9  --   --   --   PLT 329 250  --   --   --   CREATININE 7.48* 6.76* 6.58* 6.13* 5.80*    Estimated Creatinine Clearance: 14.7 mL/min (by C-G formula based on Cr of 5.8).   Medical History: Past Medical History  Diagnosis Date  . Hypertension   . HLD (hyperlipidemia)   . Erectile dysfunction   . Chronic back pain   . CAD (coronary artery disease)   . GERD (gastroesophageal reflux disease)   . Arthritis     knees,lower back  . Carotid artery disease (HCC)   . PVD (peripheral vascular disease) with claudication (HCC)   . AAA (abdominal aortic aneurysm) without rupture (HCC) 06/2014    no change in last exam from 2015  . History of CVA (cerebrovascular accident)     2009  &  2013  . Type 2 diabetes mellitus (HCC)     no meds per pt    Medications:  Prescriptions prior to admission  Medication Sig Dispense Refill Last Dose  . acetaminophen (TYLENOL) 500 MG tablet Take 1 tablet (500 mg total) by mouth every 6 (six) hours as needed. 30 tablet 0 unknown  . amLODipine (NORVASC) 10 MG tablet Take 1 tablet (10 mg total) by mouth daily. 90 tablet 1 01/13/2015 at Unknown time  . aspirin EC 81 MG tablet Take 81 mg by mouth daily.   01/13/2015 at Unknown time  . atorvastatin (LIPITOR) 40 MG tablet Take 1 tablet (40 mg total) by mouth daily. 90 tablet 1 01/13/2015 at Unknown time  . finasteride (PROSCAR) 5 MG tablet Take 1  tablet (5 mg total) by mouth daily. 30 tablet 1 01/09/2015  . meclizine (ANTIVERT) 25 MG tablet Take 1 tablet (25 mg total) by mouth 3 (three) times daily as needed for dizziness. 30 tablet 1 unknown  . traMADol (ULTRAM) 50 MG tablet Take 1-2 tablets (50-100 mg total) by mouth every 8 (eight) hours as needed for moderate pain. 50 tablet 0 01/13/2015 at Unknown time  . gabapentin (NEURONTIN) 100 MG capsule Take 1 capsule (100 mg total) by mouth 3 (three) times daily. (Patient not taking: Reported on 01/14/2015) 180 capsule 2 Not Taking at Unknown time  . metFORMIN (GLUCOPHAGE) 500 MG tablet Take 1 tablet (500 mg total) by mouth daily. (Patient not taking: Reported on 01/14/2015) 180 tablet 3 Not Taking at Unknown time  . omeprazole (PRILOSEC) 20 MG capsule Take 1 capsule (20 mg total) by mouth daily. (Patient not taking: Reported on 01/14/2015) 30 capsule 3 Not Taking at Unknown time  . phenazopyridine (PYRIDIUM) 200 MG tablet Take 1 tablet (200 mg total) by mouth 3 (three) times daily as needed for pain. (Patient not taking: Reported on 01/14/2015) 10 tablet 0 Not Taking at Unknown time  . tamsulosin (FLOMAX) 0.4 MG CAPS capsule Take 1 capsule (0.4 mg total)  by mouth daily. (Patient not taking: Reported on 01/14/2015) 30 capsule 3 Not Taking at Unknown time   Scheduled:  . amLODipine  10 mg Oral Daily  . aspirin EC  81 mg Oral Daily  . atorvastatin  40 mg Oral q1800  . cefTRIAXone (ROCEPHIN)  IV  1 g Intravenous Q24H  . Chlorhexidine Gluconate Cloth  6 each Topical Q0600  . finasteride  5 mg Oral Daily  . Influenza vac split quadrivalent PF  0.5 mL Intramuscular Tomorrow-1000  . insulin aspart  0-9 Units Subcutaneous TID WC  . mupirocin ointment  1 application Nasal BID  . sodium chloride  3 mL Intravenous Q12H  . tamsulosin  0.4 mg Oral QHS   Infusions:  . dextrose 175 mL/hr at 01/15/15 1708    Assessment: 69yo male admitted yesterday for acute on chronic renal failure, hyperkalemia, and  hypernatremia, went into Aflutter this am, getting dilt bolus now, to begin heparin.  Goal of Therapy:  Heparin level 0.3-0.7 units/ml Monitor platelets by anticoagulation protocol: Yes   Plan:  Last rec'd SQ heparin at 2200; will give heparin 2000 units IV bolus x1 followed by gtt at 1200 units/hr and monitor heparin levels and CBC.  Vernard Gambles, PharmD, BCPS  01/16/2015,4:53 AM

## 2015-01-16 NOTE — Progress Notes (Signed)
  Echocardiogram 2D Echocardiogram has been performed.  Brian Burns M 01/16/2015, 9:52 AM

## 2015-01-17 DIAGNOSIS — N179 Acute kidney failure, unspecified: Principal | ICD-10-CM

## 2015-01-17 DIAGNOSIS — B958 Unspecified staphylococcus as the cause of diseases classified elsewhere: Secondary | ICD-10-CM | POA: Diagnosis present

## 2015-01-17 DIAGNOSIS — N189 Chronic kidney disease, unspecified: Secondary | ICD-10-CM

## 2015-01-17 LAB — GLUCOSE, CAPILLARY
GLUCOSE-CAPILLARY: 116 mg/dL — AB (ref 65–99)
GLUCOSE-CAPILLARY: 124 mg/dL — AB (ref 65–99)
GLUCOSE-CAPILLARY: 107 mg/dL — AB (ref 65–99)
Glucose-Capillary: 131 mg/dL — ABNORMAL HIGH (ref 65–99)

## 2015-01-17 LAB — CBC
HCT: 37.7 % — ABNORMAL LOW (ref 39.0–52.0)
HEMOGLOBIN: 12 g/dL — AB (ref 13.0–17.0)
MCH: 27.5 pg (ref 26.0–34.0)
MCHC: 31.8 g/dL (ref 30.0–36.0)
MCV: 86.5 fL (ref 78.0–100.0)
PLATELETS: 211 10*3/uL (ref 150–400)
RBC: 4.36 MIL/uL (ref 4.22–5.81)
RDW: 15.2 % (ref 11.5–15.5)
WBC: 8.4 10*3/uL (ref 4.0–10.5)

## 2015-01-17 LAB — TROPONIN I: Troponin I: 0.04 ng/mL — ABNORMAL HIGH (ref <0.031)

## 2015-01-17 LAB — RENAL FUNCTION PANEL
ANION GAP: 10 (ref 5–15)
Albumin: 3.4 g/dL — ABNORMAL LOW (ref 3.5–5.0)
BUN: 75 mg/dL — AB (ref 6–20)
CALCIUM: 8.6 mg/dL — AB (ref 8.9–10.3)
CO2: 20 mmol/L — AB (ref 22–32)
Chloride: 114 mmol/L — ABNORMAL HIGH (ref 101–111)
Creatinine, Ser: 4.64 mg/dL — ABNORMAL HIGH (ref 0.61–1.24)
GFR calc Af Amer: 14 mL/min — ABNORMAL LOW (ref >60)
GFR calc non Af Amer: 12 mL/min — ABNORMAL LOW (ref >60)
GLUCOSE: 128 mg/dL — AB (ref 65–99)
Phosphorus: 4.6 mg/dL (ref 2.5–4.6)
Potassium: 4.1 mmol/L (ref 3.5–5.1)
SODIUM: 144 mmol/L (ref 135–145)

## 2015-01-17 LAB — PROTIME-INR
INR: 1.42 (ref 0.00–1.49)
Prothrombin Time: 17.4 seconds — ABNORMAL HIGH (ref 11.6–15.2)

## 2015-01-17 LAB — URINE CULTURE

## 2015-01-17 LAB — HEPARIN LEVEL (UNFRACTIONATED): HEPARIN UNFRACTIONATED: 0.94 [IU]/mL — AB (ref 0.30–0.70)

## 2015-01-17 MED ORDER — WARFARIN VIDEO
Freq: Once | Status: AC
  Administered 2015-01-17: 1

## 2015-01-17 MED ORDER — HALOPERIDOL 0.5 MG PO TABS
0.5000 mg | ORAL_TABLET | Freq: Four times a day (QID) | ORAL | Status: DC
  Administered 2015-01-17: 0.5 mg via ORAL
  Filled 2015-01-17 (×5): qty 1

## 2015-01-17 MED ORDER — POLYETHYLENE GLYCOL 3350 17 G PO PACK
17.0000 g | PACK | Freq: Every day | ORAL | Status: DC
  Administered 2015-01-17 – 2015-01-18 (×2): 17 g via ORAL
  Filled 2015-01-17 (×4): qty 1

## 2015-01-17 MED ORDER — SENNOSIDES-DOCUSATE SODIUM 8.6-50 MG PO TABS
1.0000 | ORAL_TABLET | Freq: Two times a day (BID) | ORAL | Status: DC
  Administered 2015-01-17 – 2015-01-20 (×5): 1 via ORAL
  Filled 2015-01-17 (×6): qty 1

## 2015-01-17 MED ORDER — HALOPERIDOL 0.5 MG PO TABS
0.5000 mg | ORAL_TABLET | Freq: Four times a day (QID) | ORAL | Status: DC | PRN
  Filled 2015-01-17: qty 1

## 2015-01-17 MED ORDER — DEXTROSE 5 % IV SOLN
INTRAVENOUS | Status: DC
Start: 2015-01-17 — End: 2015-01-19
  Administered 2015-01-18 – 2015-01-19 (×2): via INTRAVENOUS

## 2015-01-17 MED ORDER — COUMADIN BOOK
Freq: Once | Status: AC
  Administered 2015-01-17: 1
  Filled 2015-01-17: qty 1

## 2015-01-17 MED ORDER — HEPARIN (PORCINE) IN NACL 100-0.45 UNIT/ML-% IJ SOLN
700.0000 [IU]/h | INTRAMUSCULAR | Status: DC
  Administered 2015-01-17: 700 [IU]/h via INTRAVENOUS
  Filled 2015-01-17: qty 250

## 2015-01-17 MED ORDER — WARFARIN SODIUM 5 MG PO TABS
7.5000 mg | ORAL_TABLET | Freq: Once | ORAL | Status: AC
  Administered 2015-01-17: 7.5 mg via ORAL
  Filled 2015-01-17: qty 2

## 2015-01-17 NOTE — Progress Notes (Signed)
Contacted [facility] and spoke to MD [facility] concerning pts heart rate. It goes into the upper 40s and then returns to a 60s and 70s. Was advised to continue his Cardizem drip d.t possible cardioversion in the morning. Will continue to monitor.

## 2015-01-17 NOTE — Care Management Important Message (Signed)
Important Message  Patient Details  Name: Brian Burns MRN: [id number] Date of Birth: March 24, 1945   Medicare Important Message Given:  Yes    Hanley Hays, RN 01/17/2015, 9:27 AM

## 2015-01-17 NOTE — Progress Notes (Addendum)
ANTICOAGULATION CONSULT NOTE - Follow Up Consult  Pharmacy Consult for heparin Indication: atrial fibrillation  Labs:  Recent Labs  01/14/15 1827 01/15/15 0501  01/15/15 2308  01/16/15 0045 01/16/15 0520 01/16/15 1057 01/16/15 1350 01/16/15 1652 01/16/15 2040  HGB 15.1 13.0  --   --   --   --  12.5*  --   --   --   --   HCT 46.7 41.9  --   --   --   --  39.6  --   --   --   --   PLT 329 250  --   --   --   --  218  --   --   --   --   LABPROT  --   --   --   --   --   --   --   --   --   --  16.6*  INR  --   --   --   --   --   --   --   --   --   --  1.33  HEPARINUNFRC  --   --   --   --   --  0.94*  --   --  0.98*  --   --   CREATININE 7.48* 6.76*  < > 5.80*  --   --  5.43*  --  4.99*  --   --   TROPONINI  --   --   --   --   < > 0.04* 0.04* 0.03  --  0.04*  --   < > = values in this interval not displayed.   Assessment: 68yo male remains supratherapeutic on heparin with little change in level despite rate decrease (lab document error shows lab drawn 12/19 at 0045 but was drawn 12/19 ~2345); RN reports oozing from IV site and some blood from penis.  Goal of Therapy:  Heparin level 0.3-0.7 units/ml   Plan:  Will hold heparin gtt x31min then resume heparin at lower rate of 700 units/hr and check level in 8hr.  Vernard Gambles, PharmD, BCPS  01/17/2015,12:44 AM   ADDENDUM: Spoke w/ RN Marcelino Duster who reports bleeding from penis and oozing from IV site continues.  Hgb down to 12 from 15 3d ago.  Has been in NSR/SB w/o Afib x24hr after new onset yesterday am, remains on Cardizem gtt.  Spoke w/ Dr Earlene Plater of [facility], agree to hold heparin for now and f/u later today w/ rounding team.    VB 01/17/2015 5:33 AM

## 2015-01-17 NOTE — Progress Notes (Signed)
TELEMETRY: Reviewed telemetry pt in sinus brady: Filed Vitals:   01/17/15 0809 01/17/15 1000 01/17/15 1009 01/17/15 1238  BP: 157/90 151/93 151/93 149/94  Pulse: 67 63    Temp: 98.6 F (37 C)   97.5 F (36.4 C)  TempSrc: Oral   Axillary  Resp: 15 16  17   Height:      Weight:      SpO2: 100% 99%      Intake/Output Summary (Last 24 hours) at 01/17/15 1435 Last data filed at 01/17/15 1010  Gross per 24 hour  Intake 3683.35 ml  Output   1500 ml  Net 2183.35 ml   Filed Weights   01/15/15 0109 01/16/15 0113 01/17/15 0500  Weight: 80.7 kg (177 lb 14.6 oz) 84.1 kg (185 lb 6.5 oz) 85.6 kg (188 lb 11.4 oz)    Subjective No cardiac complaints. Just want to eat. No chest pain, dyspnea, or palpitations.  Marland Kitchen acetaminophen  650 mg Oral TID  . amLODipine  10 mg Oral Daily  . aspirin EC  81 mg Oral Daily  . atorvastatin  40 mg Oral q1800  . Chlorhexidine Gluconate Cloth  6 each Topical Q0600  . doxycycline  100 mg Oral Q12H  . finasteride  5 mg Oral Daily  . haloperidol  0.5 mg Oral Q6H  . insulin aspart  0-9 Units Subcutaneous TID WC  . mupirocin ointment  1 application Nasal BID  . pantoprazole  40 mg Oral Daily  . sodium chloride  3 mL Intravenous Q12H  . tamsulosin  0.4 mg Oral QHS  . warfarin  7.5 mg Oral ONCE-1800  . Warfarin - Pharmacist Dosing Inpatient   Does not apply q1800   . dextrose 100 mL/hr at 01/16/15 2224    LABS: Basic Metabolic Panel:  Recent Labs  76/80/88 0501  01/16/15 1350 01/17/15 0321  NA 149*  < > 146* 144  K 4.9  < > 4.3 4.1  CL 120*  < > 115* 114*  CO2 17*  < > 17* 20*  GLUCOSE 216*  < > 131* 128*  BUN 126*  < > 88* 75*  CREATININE 6.76*  < > 4.99* 4.64*  CALCIUM 8.8*  < > 8.5* 8.6*  MG 3.5*  --   --   --   PHOS 6.3*  < > 4.7* 4.6  < > = values in this interval not displayed. Liver Function Tests:  Recent Labs  01/14/15 1827  01/16/15 1350 01/17/15 0321  AST 29  --   --   --   ALT 33  --   --   --   ALKPHOS 125  --   --    --   BILITOT 0.7  --   --   --   PROT 9.7*  --   --   --   ALBUMIN 4.6  < > 3.6 3.4*  < > = values in this interval not displayed.  Recent Labs  01/14/15 1827 01/15/15 0501  LIPASE 172* 169*   CBC:  Recent Labs  01/14/15 1827  01/16/15 0520 01/17/15 0321  WBC 14.5*  < > 7.5 8.4  NEUTROABS 12.2*  --   --   --   HGB 15.1  < > 12.5* 12.0*  HCT 46.7  < > 39.6 37.7*  MCV 86.6  < > 86.8 86.5  PLT 329  < > 218 211  < > = values in this interval not displayed. Cardiac Enzymes:  Recent Labs  01/16/15 0520 01/16/15 1057 01/16/15 1652  TROPONINI 0.04* 0.03 0.04*   BNP: No results for input(s): PROBNP in the last 72 hours. D-Dimer: No results for input(s): DDIMER in the last 72 hours. Hemoglobin A1C:  Recent Labs  01/15/15 0501  HGBA1C 6.8*   Fasting Lipid Panel: No results for input(s): CHOL, HDL, LDLCALC, TRIG, CHOLHDL, LDLDIRECT in the last 72 hours. Thyroid Function Tests: No results for input(s): TSH, T4TOTAL, T3FREE, THYROIDAB in the last 72 hours.  Invalid input(s): FREET3   Radiology/Studies:  US Abdomen Complete  01/16/2015  CLINICAL DATA:  Acute right flank and right abdomen pain, elevated lipase EXAM: ABDOMEN ULTRASOUND COMPLETE COMPARISON:  None. FINDINGS: Gallbladder: No stones. No wall thickening or Murphy sign. Sludge in the gallbladder lumen is identified. Common bile duct: Diameter: 4 mm Liver: Mildly increased echogenicity with no focal abnormalities IVC: No abnormality visualized. Pancreas: Not visualized Spleen: Size and appearance within normal limits. Right Kidney: Length: 13 cm. Mildly echogenic with no focal abnormalities Left Kidney: Length: 13 cm. Mildly echogenic with no focal abnormalities Abdominal aorta: Proximal aorta not seen. Distal abdominal aorta measures 32 x 33 mm. Other findings: None. IMPRESSION: Gallbladder sludge Medical renal disease Pancreas not visualized Mild distal abdominal aortic aneurysm. Recommend followup by ultrasound in  3 years. This recommendation follows ACR consensus guidelines: White Paper of the ACR Incidental Findings Committee II on Vascular Findings. [facility] Coll Radiol 2013; 10:789-794 Electronically Signed   By: Esperanza Heir M.D.   On: 01/16/2015 21:35    PHYSICAL EXAM General: Well developed, elderly BM in no acute distress. Head: Normocephalic, atraumatic, sclera non-icteric, oropharynx is clear Neck: Negative for carotid bruits. JVD not elevated. No adenopathy Lungs: Clear bilaterally to auscultation without wheezes, rales, or rhonchi. Breathing is unlabored. Heart: RRR S1 S2 without murmurs, rubs, or gallops.  Abdomen: Soft, non-tender, non-distended with normoactive bowel sounds.  Extremities: No clubbing, cyanosis or edema.  Distal pedal pulses are 2+ and equal bilaterally. Neuro: Alert and oriented X 3. Moves all extremities spontaneously. Psych:  Responds to questions appropriately with a normal affect.  ASSESSMENT AND PLAN: 1. Atrial fibrillation paroxysmal. Now converted to NSR. Bradycardic on IV cardizem. Will DC cardizem. Resume po. Does not need DCCV. Anticoagulation with coumadin. Arrhythmia probably precipitated by acute illness. Will monitor for recurrence.  2. HTN 3. CAD  Present on Admission:  . Renal failure . Renal failure (ARF), acute on chronic (HCC)  Signed, Peter [place], MDFACC 01/17/2015 2:35 PM

## 2015-01-17 NOTE — Progress Notes (Signed)
S: Appetite marginal.  Epigastric pain better O:BP 157/90 mmHg  Pulse 67  Temp(Src) 98.6 F (37 C) (Oral)  Resp 15  Ht 6\' 4"  (1.93 m)  Wt 85.6 kg (188 lb 11.4 oz)  BMI 22.98 kg/m2  SpO2 100%  Intake/Output Summary (Last 24 hours) at 01/17/15 0815 Last data filed at 01/17/15 0700  Gross per 24 hour  Intake 3623.35 ml  Output   1501 ml  Net 2122.35 ml   Weight change: 1.5 kg (3 lb 4.9 oz) QPR:FFMBW and alert CVS: RRR Resp:clear Abd:+ BS NTND no HSM Ext:no edema NEURO:CNI Ox3 no asterixis   . acetaminophen  650 mg Oral TID  . amLODipine  10 mg Oral Daily  . aspirin EC  81 mg Oral Daily  . atorvastatin  40 mg Oral q1800  . Chlorhexidine Gluconate Cloth  6 each Topical Q0600  . doxycycline  100 mg Oral Q12H  . finasteride  5 mg Oral Daily  . insulin aspart  0-9 Units Subcutaneous TID WC  . metoCLOPramide (REGLAN) injection  5 mg Intravenous 4 times per day  . mupirocin ointment  1 application Nasal BID  . pantoprazole  40 mg Oral Daily  . sodium chloride  3 mL Intravenous Q12H  . tamsulosin  0.4 mg Oral QHS  . Warfarin - Pharmacist Dosing Inpatient   Does not apply q1800   US Abdomen Complete  01/16/2015  CLINICAL DATA:  Acute right flank and right abdomen pain, elevated lipase EXAM: ABDOMEN ULTRASOUND COMPLETE COMPARISON:  None. FINDINGS: Gallbladder: No stones. No wall thickening or Murphy sign. Sludge in the gallbladder lumen is identified. Common bile duct: Diameter: 4 mm Liver: Mildly increased echogenicity with no focal abnormalities IVC: No abnormality visualized. Pancreas: Not visualized Spleen: Size and appearance within normal limits. Right Kidney: Length: 13 cm. Mildly echogenic with no focal abnormalities Left Kidney: Length: 13 cm. Mildly echogenic with no focal abnormalities Abdominal aorta: Proximal aorta not seen. Distal abdominal aorta measures 32 x 33 mm. Other findings: None. IMPRESSION: Gallbladder sludge Medical renal disease Pancreas not visualized Mild  distal abdominal aortic aneurysm. Recommend followup by ultrasound in 3 years. This recommendation follows ACR consensus guidelines: White Paper of the ACR Incidental Findings Committee II on Vascular Findings. [facility] Coll Radiol 2013; 10:789-794 Electronically Signed   By: Esperanza Heir M.D.   On: 01/16/2015 21:35   US Renal  01/15/2015  CLINICAL DATA:  Acute kidney injury. EXAM: RENAL / URINARY TRACT ULTRASOUND COMPLETE COMPARISON:  CT of the abdomen and pelvis 09/02/2012. FINDINGS: Right Kidney: Length: 12.6 cm, within normal limits. The right kidney is hyperechoic to the index organ, the liver. No mass or hydronephrosis visualized. Left Kidney: Length: 10.4 cm, within normal limits with. The left kidney is somewhat hyperechoic as well. No mass or hydronephrosis visualized. Bladder: A Foley catheter is present within the urinary bladder. IMPRESSION: The kidneys are hyperechoic bilaterally without focal mass lesion or hydronephrosis. This is nonspecific, but can be seen in the setting of medical renal disease. Electronically Signed   By: Marin Roberts M.D.   On: 01/15/2015 13:41   BMET    Component Value Date/Time   NA 144 01/17/2015 0321   K 4.1 01/17/2015 0321   CL 114* 01/17/2015 0321   CO2 20* 01/17/2015 0321   GLUCOSE 128* 01/17/2015 0321   BUN 75* 01/17/2015 0321   CREATININE 4.64* 01/17/2015 0321   CREATININE 1.30 03/18/2014 1234   CALCIUM 8.6* 01/17/2015 0321   GFRNONAA 12* 01/17/2015  0321   GFRNONAA 71 05/28/2013 1442   GFRAA 14* 01/17/2015 0321   GFRAA 82 05/28/2013 1442   CBC    Component Value Date/Time   WBC 8.4 01/17/2015 0321   RBC 4.36 01/17/2015 0321   HGB 12.0* 01/17/2015 0321   HCT 37.7* 01/17/2015 0321   PLT 211 01/17/2015 0321   MCV 86.5 01/17/2015 0321   MCH 27.5 01/17/2015 0321   MCHC 31.8 01/17/2015 0321   RDW 15.2 01/17/2015 0321   LYMPHSABS 1.4 01/14/2015 1827   MONOABS 0.9 01/14/2015 1827   EOSABS 0.0 01/14/2015 1827   BASOSABS 0.0 01/14/2015  1827     Assessment: 1. Acute on CKD 3 most likely secondary to obstruction, at least partial.  Scr improving with placement of foley despite [place] not showing hydro.  Suspect some volume depletion also playing a role.  UO OK and Scr trending down 2. HTN 3. Urethral stricture, SP dilation 10/16 4. HyperNa, resolved 5. HyperK resolved 6. A fib  On coumadin and cardizem 7. ? UTI    Plan: 1. Cont IV fluids 2. Daily Scr 3. At some urology will need to be involved to follow pt with foley and make decision about removal with CLOSE FU.     Elya Tarquinio T

## 2015-01-17 NOTE — Progress Notes (Signed)
ANTICOAGULATION CONSULT NOTE - Follow Up Consult  Pharmacy Consult for Warfarin Indication: atrial fibrillation  Allergies  Allergen Reactions  . Novocain [Procaine Hcl] Nausea And Vomiting    Patient Measurements: Height: 6\' 4"  (193 cm) Weight: 188 lb 11.4 oz (85.6 kg) IBW/kg (Calculated) : 86.8   Vital Signs: Temp: 98.6 F (37 C) (12/20 0809) Temp Source: Oral (12/20 0809) BP: 157/90 mmHg (12/20 0809) Pulse Rate: 67 (12/20 0809)  Labs:  Recent Labs  01/15/15 0501  01/16/15 0045 01/16/15 0520 01/16/15 1057 01/16/15 1350 01/16/15 1652 01/16/15 2040 01/17/15 0321  HGB 13.0  --   --  12.5*  --   --   --   --  12.0*  HCT 41.9  --   --  39.6  --   --   --   --  37.7*  PLT 250  --   --  218  --   --   --   --  211  LABPROT  --   --   --   --   --   --   --  16.6* 17.4*  INR  --   --   --   --   --   --   --  1.33 1.42  HEPARINUNFRC  --   --  0.94*  --   --  0.98*  --   --   --   CREATININE 6.76*  < >  --  5.43*  --  4.99*  --   --  4.64*  TROPONINI  --   < > 0.04* 0.04* 0.03  --  0.04*  --   --   < > = values in this interval not displayed.  Estimated Creatinine Clearance: 18.7 mL/min (by C-G formula based on Cr of 4.64).   Assessment: 80 YOM with new-onset Afib - was started on heparin, but d/t bleeding issues, this was stopped. Started on warfarin last evening- plan is for DCCV today.  INR 1.42 today. Hgb 12, plts 211.  Note: patient is on doxycycline currently which can potentiate INR.  Goal of Therapy:  INR 2-3 Monitor platelets by anticoagulation protocol: Yes   Plan:  -Warfarin 7.5 mg x 1 tonight  -Daily PT/INR- watch with doxycycline -Will continue to monitor for any signs/symptoms of bleeding -Will provide education prior to discharge - book and video ordered for today  Hinley Brimage D. Rosalynn Sergent, PharmD, BCPS Clinical Pharmacist Pager: [phone] 01/17/2015 9:16 AM

## 2015-01-18 LAB — PROTIME-INR
INR: 1.52 — AB (ref 0.00–1.49)
Prothrombin Time: 18.4 seconds — ABNORMAL HIGH (ref 11.6–15.2)

## 2015-01-18 LAB — RENAL FUNCTION PANEL
Albumin: 3.2 g/dL — ABNORMAL LOW (ref 3.5–5.0)
Anion gap: 8 (ref 5–15)
BUN: 56 mg/dL — ABNORMAL HIGH (ref 6–20)
CO2: 20 mmol/L — ABNORMAL LOW (ref 22–32)
Calcium: 8.6 mg/dL — ABNORMAL LOW (ref 8.9–10.3)
Chloride: 111 mmol/L (ref 101–111)
Creatinine, Ser: 3.75 mg/dL — ABNORMAL HIGH (ref 0.61–1.24)
GFR calc Af Amer: 18 mL/min — ABNORMAL LOW (ref >60)
GFR calc non Af Amer: 15 mL/min — ABNORMAL LOW (ref >60)
Glucose, Bld: 137 mg/dL — ABNORMAL HIGH (ref 65–99)
Phosphorus: 4.5 mg/dL (ref 2.5–4.6)
Potassium: 4 mmol/L (ref 3.5–5.1)
Sodium: 139 mmol/L (ref 135–145)

## 2015-01-18 LAB — CBC
HCT: 35.4 % — ABNORMAL LOW (ref 39.0–52.0)
Hemoglobin: 11.6 g/dL — ABNORMAL LOW (ref 13.0–17.0)
MCH: 28 pg (ref 26.0–34.0)
MCHC: 32.8 g/dL (ref 30.0–36.0)
MCV: 85.3 fL (ref 78.0–100.0)
Platelets: 194 K/uL (ref 150–400)
RBC: 4.15 MIL/uL — ABNORMAL LOW (ref 4.22–5.81)
RDW: 14.8 % (ref 11.5–15.5)
WBC: 8 K/uL (ref 4.0–10.5)

## 2015-01-18 LAB — GLUCOSE, CAPILLARY
GLUCOSE-CAPILLARY: 107 mg/dL — AB (ref 65–99)
GLUCOSE-CAPILLARY: 101 mg/dL — AB (ref 65–99)
Glucose-Capillary: 131 mg/dL — ABNORMAL HIGH (ref 65–99)
Glucose-Capillary: 119 mg/dL — ABNORMAL HIGH (ref 65–99)

## 2015-01-18 MED ORDER — WARFARIN SODIUM 7.5 MG PO TABS
7.5000 mg | ORAL_TABLET | Freq: Once | ORAL | Status: AC
  Administered 2015-01-18: 7.5 mg via ORAL
  Filled 2015-01-18: qty 1

## 2015-01-18 MED ORDER — TAMSULOSIN HCL 0.4 MG PO CAPS
0.8000 mg | ORAL_CAPSULE | Freq: Every day | ORAL | Status: DC
  Administered 2015-01-18: 0.8 mg via ORAL
  Filled 2015-01-18 (×2): qty 2

## 2015-01-18 NOTE — Progress Notes (Signed)
Patient arrived on unit via bed.  No family at bedside.  Telemetry placed per MD order.  CMT notified by two RN's.

## 2015-01-18 NOTE — Progress Notes (Signed)
ANTICOAGULATION CONSULT NOTE - Follow Up Consult  Pharmacy Consult for Warfarin Indication: atrial fibrillation  Allergies  Allergen Reactions  . Novocain [Procaine Hcl] Nausea And Vomiting    Patient Measurements: Height: 6\' 4"  (193 cm) Weight: 190 lb 0.6 oz (86.2 kg) IBW/kg (Calculated) : 86.8   Vital Signs: Temp: 97.7 F (36.5 C) (12/21 0834) Temp Source: Axillary (12/21 0834) BP: 148/103 mmHg (12/21 0914) Pulse Rate: 70 (12/21 0900)  Labs:  Recent Labs  01/16/15 0045  01/16/15 0520 01/16/15 1057 01/16/15 1350 01/16/15 1652 01/16/15 2040 01/17/15 0321 01/18/15 0439  HGB  --   < > 12.5*  --   --   --   --  12.0* 11.6*  HCT  --   --  39.6  --   --   --   --  37.7* 35.4*  PLT  --   --  218  --   --   --   --  211 194  LABPROT  --   --   --   --   --   --  16.6* 17.4* 18.4*  INR  --   --   --   --   --   --  1.33 1.42 1.52*  HEPARINUNFRC 0.94*  --   --   --  0.98*  --   --   --   --   CREATININE  --   --  5.43*  --  4.99*  --   --  4.64* 3.75*  TROPONINI 0.04*  --  0.04* 0.03  --  0.04*  --   --   --   < > = values in this interval not displayed.  Estimated Creatinine Clearance: 23.3 mL/min (by C-G formula based on Cr of 3.75).   Assessment: 57 YOM admitted with new onset Afib -now converted to NSR on warfarin. INR up at 1.52 today after 2 doses of 7.5mg . CBC low-stable. No further bleeding noted now off IV heparin.   Note: patient is on doxycycline currently which can potentiate INR.  Goal of Therapy:  INR 2-3 Monitor platelets by anticoagulation protocol: Yes   Plan:  -Warfarin 7.5 mg x 1 again tonight  -Daily PT/INR- watch with doxycycline -Will continue to monitor for any signs/symptoms of bleeding -Will provide education prior to discharge   Link Snuffer, PharmD, BCPS Clinical Pharmacist [phone]  01/18/2015 11:44 AM

## 2015-01-18 NOTE — Progress Notes (Signed)
Family Medicine Teaching Service Daily Progress Note Intern Pager: [phone]  Patient name: Brian Burns Medical record number: [id number] Date of birth: 13-Jun-1945 Age: 69 y.o. Gender: male  Primary Care Provider: Shirlee Latch, MD Consultants: Renal Code Status: FULL  Pt Overview and Major Events to Date:  12/18: admit for acute on chronic renal failure secondary to obstruction 12/19: new-onset a.fib with RVR - resolved 12/20: spontaneously cardioverted into NSR, off dilt drip 2/2 bradycardia  Assessment and Plan:  Brian Burns is a 69 y.o. male presenting with acute on chronic renal failure secondary to obstruction. PMH is significant for urethral strictures, CAD, HTN, CVA 2, BPH, DM 2.   Acute on chronic renal failure secondary to obstruction: Urinary catheter in place. Cr 7.48 on admission >> 3.75 this AM. Renal US showed hyperechoic kidneys bilaterally with no hydronephrosis or lesions.  - Nephrology following, appreciate recs - continue IVFs - Will need to d/c with foley with close Urology follow-up - Strict I&Os - Cont IVF. Encourage PO intake.  - Continue finasteride 5 mg, Flomax daily (increase Flomax to max dose)  New-onset atrial fibrillation with RVR: Resolved with spontaneous cardioversion.  Bradycardic, so dilt drip d/c'd by cardiology 12/20. Likely precipitated by acute illness - monitor for recurrence - Off diltiazem and HR not current able to tolerate CCB or BB - Coumadin per pharmacy - Will need cardiology follow-up outpatient after discharge  UTI 2/2 Staph Aureus: Likely 2/2 recent instrumentation   - Continue doxycycline (12/19>>) - F/u blood cultures   Pancreatitis: Lipase 169. US showed gallbladder sludge with medical renal disease and mild distal AAA but pancreas was not visualized.  - Continue Oxy IR, Zofran prn  Hiccups: Intractable x2 days, but now improving. Potentially due to diaphragmatic irritation by pancreatitis given increased lipase. -  Continue Haldol 0.5 mg PO q6h  Hyperkalemia, Hypernatremia: Resolved - Continue to monitor  Cardiac: CAD, HTN, PAD, HF, AAA (~ 2.94x3.14cm). Echo 12/19 with low normal LVF and EF 50-55%. BPs consistently elevated, but patient is in pain - Continue home Norvasc 10 mg daily - Continue to monitor - HR not currently able to tolerate BBlocker or CCB  Type II Diabetes mellitus: A1C 6.8 (12/16). - Holding Metformin - SSI sensitive AC  H/o CVA - Continue home: ASA 81 mg daily, Lipitor 40 mg daily at bedtime  FEN/GI: Heart healthy/carb mod diet; D5W @50  mL/hr per renal rec (reevaluate today, as not tolerating much PO) Prophylaxis: coumadin (heparin stopped due to bleeding)  Disposition: Transfer to tele; discharge pending improvement of renal failure and tolerance of PO  Subjective:  No further bleeding. Continue to have abd pain and nausea with eating or drinking.  Hiccups have improved some, but occur whenever he drinks anything.  Not taking much PO  Objective: Temp:  [97.5 F (36.4 C)-98 F (36.7 C)] 98 F (36.7 C) (12/21 0435) Pulse Rate:  [63-75] 70 (12/21 0435) Resp:  [14-17] 16 (12/20 2105) BP: (144-167)/(86-109) 166/94 mmHg (12/21 0435) SpO2:  [97 %-99 %] 99 % (12/20 2105) Weight:  [190 lb 0.6 oz (86.2 kg)] 190 lb 0.6 oz (86.2 kg) (12/21 0500) Physical Exam General: lying in bed in NAD Cardiovascular: distant heart sounds but RRR, no murmurs appreciated Respiratory: CTAB, no wheezes, normal WOB Abdomen: soft, non-tender, non-distended, +BS Neuro: alert and oriented, no focal deficits Psych: appropriate mood and affect   Laboratory:  Recent Labs Lab 01/16/15 0520 01/17/15 0321 01/18/15 0439  WBC 7.5 8.4 8.0  HGB 12.5* 12.0* 11.6*  HCT 39.6 37.7* 35.4*  PLT 218 211 194    Recent Labs Lab 01/14/15 1827  01/16/15 1350 01/17/15 0321 01/18/15 0439  NA 150*  < > 146* 144 139  K 6.2*  < > 4.3 4.1 4.0  CL 116*  < > 115* 114* 111  CO2 20*  < > 17* 20* 20*  BUN  139*  < > 88* 75* 56*  CREATININE 7.48*  < > 4.99* 4.64* 3.75*  CALCIUM 9.7  < > 8.5* 8.6* 8.6*  PROT 9.7*  --   --   --   --   BILITOT 0.7  --   --   --   --   ALKPHOS 125  --   --   --   --   ALT 33  --   --   --   --   AST 29  --   --   --   --   GLUCOSE 135*  < > 131* 128* 137*  < > = values in this interval not displayed.  Imaging: US Renal  01/15/2015  CLINICAL DATA:  Acute kidney injury. EXAM: RENAL / URINARY TRACT ULTRASOUND COMPLETE COMPARISON:  CT of the abdomen and pelvis 09/02/2012. FINDINGS: Right Kidney: Length: 12.6 cm, within normal limits. The right kidney is hyperechoic to the index organ, the liver. No mass or hydronephrosis visualized. Left Kidney: Length: 10.4 cm, within normal limits with. The left kidney is somewhat hyperechoic as well. No mass or hydronephrosis visualized. Bladder: A Foley catheter is present within the urinary bladder. IMPRESSION: The kidneys are hyperechoic bilaterally without focal mass lesion or hydronephrosis. This is nonspecific, but can be seen in the setting of medical renal disease. Electronically Signed   By: Marin Roberts M.D.   On: 01/15/2015 13:41   Dg Abd Acute W/chest  01/14/2015  CLINICAL DATA:  Vomiting, lower abdominal pain. History of hypertension, coronary artery disease, CVA, diabetes. Former smoker. EXAM: DG ABDOMEN ACUTE W/ 1V CHEST COMPARISON:  None. FINDINGS: Single view of the chest: Heart size is normal. Overall cardiomediastinal silhouette appears stable in size and configuration compared to an earlier chest x-ray of 10/02/2013. Lungs are clear. No pleural effusions seen. Osseous and soft tissue structures about the chest are unremarkable. Supine and decubitus views of the abdomen: Bowel gas pattern is nonobstructive. No evidence of soft tissue mass or abnormal fluid collection. No evidence of free intraperitoneal air. Vascular calcifications noted within the lower abdomen and pelvis. No acute/ significant osseous  abnormality seen. IMPRESSION: 1. Lungs are clear and there is no evidence of acute cardiopulmonary abnormality. 2. Nonobstructive bowel gas pattern and no evidence of acute intra-abdominal abnormality. Electronically Signed   By: Bary Richard M.D.   On: 01/14/2015 18:48    Erasmo Downer, MD 01/18/2015, 8:13 AM PGY-2, [facility] FPTS Intern pager: [phone], text pages welcome

## 2015-01-18 NOTE — Progress Notes (Signed)
Up in chair today with stand by assist.

## 2015-01-18 NOTE — Progress Notes (Signed)
Patient's wife was called to inform her that patient is transferred to [phone] per hospital bed.

## 2015-01-18 NOTE — Progress Notes (Signed)
Tried to call for report.  Receiving Nurse is currently busy.  Awaiting for her to call me back.

## 2015-01-18 NOTE — Progress Notes (Signed)
TELEMETRY: Reviewed telemetry pt in sinus brady, occ. PVC: Filed Vitals:   01/18/15 0500 01/18/15 0834 01/18/15 0900 01/18/15 0914  BP:  143/103 148/103 148/103  Pulse:   70   Temp:  97.7 F (36.5 C)    TempSrc:  Axillary    Resp:  14 13   Height:      Weight: 86.2 kg (190 lb 0.6 oz)     SpO2:   98%     Intake/Output Summary (Last 24 hours) at 01/18/15 1241 Last data filed at 01/18/15 0915  Gross per 24 hour  Intake   1926 ml  Output   2225 ml  Net   -299 ml   Filed Weights   01/16/15 0113 01/17/15 0500 01/18/15 0500  Weight: 84.1 kg (185 lb 6.5 oz) 85.6 kg (188 lb 11.4 oz) 86.2 kg (190 lb 0.6 oz)    Subjective No cardiac complaints. No chest pain, dyspnea, or palpitations.  Marland Kitchen acetaminophen  650 mg Oral TID  . amLODipine  10 mg Oral Daily  . aspirin EC  81 mg Oral Daily  . atorvastatin  40 mg Oral q1800  . Chlorhexidine Gluconate Cloth  6 each Topical Q0600  . doxycycline  100 mg Oral Q12H  . finasteride  5 mg Oral Daily  . insulin aspart  0-9 Units Subcutaneous TID WC  . mupirocin ointment  1 application Nasal BID  . pantoprazole  40 mg Oral Daily  . polyethylene glycol  17 g Oral Daily  . senna-docusate  1 tablet Oral BID  . sodium chloride  3 mL Intravenous Q12H  . tamsulosin  0.8 mg Oral QHS  . warfarin  7.5 mg Oral ONCE-1800  . Warfarin - Pharmacist Dosing Inpatient   Does not apply q1800   . dextrose 100 mL/hr at 01/17/15 1900    LABS: Basic Metabolic Panel:  Recent Labs  40/98/11 0321 01/18/15 0439  NA 144 139  K 4.1 4.0  CL 114* 111  CO2 20* 20*  GLUCOSE 128* 137*  BUN 75* 56*  CREATININE 4.64* 3.75*  CALCIUM 8.6* 8.6*  PHOS 4.6 4.5   Liver Function Tests:  Recent Labs  01/17/15 0321 01/18/15 0439  ALBUMIN 3.4* 3.2*   No results for input(s): LIPASE, AMYLASE in the last 72 hours. CBC:  Recent Labs  01/17/15 0321 01/18/15 0439  WBC 8.4 8.0  HGB 12.0* 11.6*  HCT 37.7* 35.4*  MCV 86.5 85.3  PLT 211 194   Cardiac  Enzymes:  Recent Labs  01/16/15 0520 01/16/15 1057 01/16/15 1652  TROPONINI 0.04* 0.03 0.04*   BNP: No results for input(s): PROBNP in the last 72 hours. D-Dimer: No results for input(s): DDIMER in the last 72 hours. Hemoglobin A1C: No results for input(s): HGBA1C in the last 72 hours. Fasting Lipid Panel: No results for input(s): CHOL, HDL, LDLCALC, TRIG, CHOLHDL, LDLDIRECT in the last 72 hours. Thyroid Function Tests: No results for input(s): TSH, T4TOTAL, T3FREE, THYROIDAB in the last 72 hours.  Invalid input(s): FREET3   Radiology/Studies:  US Abdomen Complete  01/16/2015  CLINICAL DATA:  Acute right flank and right abdomen pain, elevated lipase EXAM: ABDOMEN ULTRASOUND COMPLETE COMPARISON:  None. FINDINGS: Gallbladder: No stones. No wall thickening or Murphy sign. Sludge in the gallbladder lumen is identified. Common bile duct: Diameter: 4 mm Liver: Mildly increased echogenicity with no focal abnormalities IVC: No abnormality visualized. Pancreas: Not visualized Spleen: Size and appearance within normal limits. Right Kidney: Length: 13 cm. Mildly echogenic with  no focal abnormalities Left Kidney: Length: 13 cm. Mildly echogenic with no focal abnormalities Abdominal aorta: Proximal aorta not seen. Distal abdominal aorta measures 32 x 33 mm. Other findings: None. IMPRESSION: Gallbladder sludge Medical renal disease Pancreas not visualized Mild distal abdominal aortic aneurysm. Recommend followup by ultrasound in 3 years. This recommendation follows ACR consensus guidelines: White Paper of the ACR Incidental Findings Committee II on Vascular Findings. [facility] Coll Radiol 2013; 10:789-794 Electronically Signed   By: Esperanza Heir M.D.   On: 01/16/2015 21:35    PHYSICAL EXAM General: Well developed, elderly BM in no acute distress. Head: Normocephalic, atraumatic, sclera non-icteric, oropharynx is clear Neck: Negative for carotid bruits. JVD not elevated. No adenopathy Lungs: Clear  bilaterally to auscultation without wheezes, rales, or rhonchi. Breathing is unlabored. Heart: RRR S1 S2 without murmurs, rubs, or gallops.  Abdomen: Soft, non-tender, non-distended with normoactive bowel sounds.  Extremities: No clubbing, cyanosis or edema.  Distal pedal pulses are 2+ and equal bilaterally. Neuro: Alert and oriented X 3. Moves all extremities spontaneously. Psych:  Responds to questions appropriately with a normal affect.  ASSESSMENT AND PLAN: 1. Atrial fibrillation paroxysmal. Now converted to NSR. No AFib for over 24 hours.  On no rate control therapy now due to bradycardia.  Anticoagulation with coumadin. Arrhythmia probably precipitated by acute illness. 2. HTN 3. CAD  I will sign off now. Please call with cardiac problems.  Present on Admission:  . Renal failure . Renal failure (ARF), acute on chronic (HCC) . AKI (acute kidney injury) (HCC) . UTI (lower urinary tract infection) . Abdominal pain . Staphylococcal infection  Signed, Kashira Behunin [place], MDFACC 01/18/2015 12:41 PM

## 2015-01-18 NOTE — Progress Notes (Signed)
S: Appetite improving but does like the food O:BP 166/94 mmHg  Pulse 70  Temp(Src) 98 F (36.7 C) (Axillary)  Resp 16  Ht  (1.93 m)  Wt 86.2 kg (190 lb 0.6 oz)  BMI 23.14 kg/m2  SpO2 99%  Intake/Output Summary (Last 24 hours) at 01/18/15 2956 Last data filed at 01/18/15 0600  Gross per 24 hour  Intake   1706 ml  Output   2225 ml  Net   -519 ml   Weight change: 0.6 kg (1 lb 5.2 oz) OZH:YQMVH and alert CVS: RRR Resp:clear Abd:+ BS NTND no HSM Ext:no edema NEURO:CNI Ox3 no asterixis   . acetaminophen  650 mg Oral TID  . amLODipine  10 mg Oral Daily  . aspirin EC  81 mg Oral Daily  . atorvastatin  40 mg Oral q1800  . Chlorhexidine Gluconate Cloth  6 each Topical Q0600  . doxycycline  100 mg Oral Q12H  . finasteride  5 mg Oral Daily  . insulin aspart  0-9 Units Subcutaneous TID WC  . mupirocin ointment  1 application Nasal BID  . pantoprazole  40 mg Oral Daily  . polyethylene glycol  17 g Oral Daily  . senna-docusate  1 tablet Oral BID  . sodium chloride  3 mL Intravenous Q12H  . tamsulosin  0.8 mg Oral QHS  . Warfarin - Pharmacist Dosing Inpatient   Does not apply q1800   US Abdomen Complete  01/16/2015  CLINICAL DATA:  Acute right flank and right abdomen pain, elevated lipase EXAM: ABDOMEN ULTRASOUND COMPLETE COMPARISON:  None. FINDINGS: Gallbladder: No stones. No wall thickening or Murphy sign. Sludge in the gallbladder lumen is identified. Common bile duct: Diameter: 4 mm Liver: Mildly increased echogenicity with no focal abnormalities IVC: No abnormality visualized. Pancreas: Not visualized Spleen: Size and appearance within normal limits. Right Kidney: Length: 13 cm. Mildly echogenic with no focal abnormalities Left Kidney: Length: 13 cm. Mildly echogenic with no focal abnormalities Abdominal aorta: Proximal aorta not seen. Distal abdominal aorta measures 32 x 33 mm. Other findings: None. IMPRESSION: Gallbladder sludge Medical renal disease Pancreas not visualized  Mild distal abdominal aortic aneurysm. Recommend followup by ultrasound in 3 years. This recommendation follows ACR consensus guidelines: White Paper of the ACR Incidental Findings Committee II on Vascular Findings. [facility] Coll Radiol 2013; 10:789-794 Electronically Signed   By: Esperanza Heir M.D.   On: 01/16/2015 21:35   BMET    Component Value Date/Time   NA 139 01/18/2015 0439   K 4.0 01/18/2015 0439   CL 111 01/18/2015 0439   CO2 20* 01/18/2015 0439   GLUCOSE 137* 01/18/2015 0439   BUN 56* 01/18/2015 0439   CREATININE 3.75* 01/18/2015 0439   CREATININE 1.30 03/18/2014 1234   CALCIUM 8.6* 01/18/2015 0439   GFRNONAA 15* 01/18/2015 0439   GFRNONAA 71 05/28/2013 1442   GFRAA 18* 01/18/2015 0439   GFRAA 82 05/28/2013 1442   CBC    Component Value Date/Time   WBC 8.0 01/18/2015 0439   RBC 4.15* 01/18/2015 0439   HGB 11.6* 01/18/2015 0439   HCT 35.4* 01/18/2015 0439   PLT 194 01/18/2015 0439   MCV 85.3 01/18/2015 0439   MCH 28.0 01/18/2015 0439   MCHC 32.8 01/18/2015 0439   RDW 14.8 01/18/2015 0439   LYMPHSABS 1.4 01/14/2015 1827   MONOABS 0.9 01/14/2015 1827   EOSABS 0.0 01/14/2015 1827   BASOSABS 0.0 01/14/2015 1827     Assessment: 1. Acute on CKD 3 most  likely secondary to obstruction, at least partial.  Scr improving with placement of foley despite [place] not showing hydro.  Suspect some volume depletion also playing a role.  UO OK and Scr cont to trend down 2. HTN 3. Urethral stricture, SP dilation 10/16 4. HyperNa, resolved 5. HyperK resolved 6. A fib, now in SR  On coumadin  7. ? UTI    Plan: 1. Daily Scr 2. Decrease IV fluids 3. Increase ambulation    Vishal Sandlin T

## 2015-01-18 NOTE — Progress Notes (Signed)
PCP Social Note  Saw patient today.  He continues to have nausea and pain with eating.  Appreciate wonderful care provided by inpatient service.  Erasmo Downer, MD, MPH PGY-2,  [facility] 01/18/2015 10:50 AM

## 2015-01-19 DIAGNOSIS — T83511D Infection and inflammatory reaction due to indwelling urethral catheter, subsequent encounter: Secondary | ICD-10-CM

## 2015-01-19 DIAGNOSIS — R1013 Epigastric pain: Secondary | ICD-10-CM

## 2015-01-19 DIAGNOSIS — N390 Urinary tract infection, site not specified: Secondary | ICD-10-CM | POA: Diagnosis present

## 2015-01-19 DIAGNOSIS — N19 Unspecified kidney failure: Secondary | ICD-10-CM

## 2015-01-19 LAB — BASIC METABOLIC PANEL
Anion gap: 9 (ref 5–15)
BUN: 45 mg/dL — AB (ref 6–20)
CHLORIDE: 109 mmol/L (ref 101–111)
CO2: 22 mmol/L (ref 22–32)
CREATININE: 3.31 mg/dL — AB (ref 0.61–1.24)
Calcium: 8.7 mg/dL — ABNORMAL LOW (ref 8.9–10.3)
GFR calc Af Amer: 21 mL/min — ABNORMAL LOW (ref >60)
GFR calc non Af Amer: 18 mL/min — ABNORMAL LOW (ref >60)
Glucose, Bld: 126 mg/dL — ABNORMAL HIGH (ref 65–99)
Potassium: 4.2 mmol/L (ref 3.5–5.1)
SODIUM: 140 mmol/L (ref 135–145)

## 2015-01-19 LAB — GLUCOSE, CAPILLARY
GLUCOSE-CAPILLARY: 106 mg/dL — AB (ref 65–99)
GLUCOSE-CAPILLARY: 111 mg/dL — AB (ref 65–99)
GLUCOSE-CAPILLARY: 111 mg/dL — AB (ref 65–99)
Glucose-Capillary: 152 mg/dL — ABNORMAL HIGH (ref 65–99)

## 2015-01-19 LAB — CBC
HCT: 36.1 % — ABNORMAL LOW (ref 39.0–52.0)
Hemoglobin: 12 g/dL — ABNORMAL LOW (ref 13.0–17.0)
MCH: 28.1 pg (ref 26.0–34.0)
MCHC: 33.2 g/dL (ref 30.0–36.0)
MCV: 84.5 fL (ref 78.0–100.0)
PLATELETS: 176 10*3/uL (ref 150–400)
RBC: 4.27 MIL/uL (ref 4.22–5.81)
RDW: 14.7 % (ref 11.5–15.5)
WBC: 8.8 10*3/uL (ref 4.0–10.5)

## 2015-01-19 LAB — PROTIME-INR
INR: 2.63 — ABNORMAL HIGH (ref 0.00–1.49)
PROTHROMBIN TIME: 27.7 s — AB (ref 11.6–15.2)

## 2015-01-19 MED ORDER — SENNOSIDES-DOCUSATE SODIUM 8.6-50 MG PO TABS
3.0000 | ORAL_TABLET | ORAL | Status: AC
  Administered 2015-01-19: 3 via ORAL
  Filled 2015-01-19: qty 3

## 2015-01-19 MED ORDER — PANTOPRAZOLE SODIUM 40 MG PO TBEC
40.0000 mg | DELAYED_RELEASE_TABLET | Freq: Once | ORAL | Status: AC
  Administered 2015-01-19: 40 mg via ORAL
  Filled 2015-01-19: qty 1

## 2015-01-19 MED ORDER — WARFARIN SODIUM 2.5 MG PO TABS
2.5000 mg | ORAL_TABLET | Freq: Once | ORAL | Status: AC
  Administered 2015-01-19: 2.5 mg via ORAL
  Filled 2015-01-19: qty 1

## 2015-01-19 MED ORDER — BISACODYL 10 MG RE SUPP
10.0000 mg | Freq: Once | RECTAL | Status: AC
  Administered 2015-01-19: 10 mg via RECTAL
  Filled 2015-01-19: qty 1

## 2015-01-19 MED ORDER — LACTULOSE 10 GM/15ML PO SOLN
30.0000 g | Freq: Once | ORAL | Status: AC
  Administered 2015-01-19: 30 g via ORAL
  Filled 2015-01-19: qty 45

## 2015-01-19 MED ORDER — DOXYCYCLINE HYCLATE 100 MG PO TABS
100.0000 mg | ORAL_TABLET | Freq: Two times a day (BID) | ORAL | Status: DC
  Administered 2015-01-19 – 2015-01-20 (×3): 100 mg via ORAL
  Filled 2015-01-19 (×3): qty 1

## 2015-01-19 NOTE — Progress Notes (Signed)
Family Medicine Teaching Service Daily Progress Note Intern Pager: [phone] name: Brian Burns Medical record number: [id number] Date of birth: 1946-01-04 Age: 69 y.o. Gender: male  Primary Care Provider: Shirlee Latch, MD Consultants: Renal Code Status: FULL  Pt Overview and Major Events to Date:  12/18: admit for acute on chronic renal failure secondary to obstruction 12/19: new-onset a.fib with RVR - resolved 12/20: spontaneously cardioverted into NSR, off dilt drip 2/2 bradycardia  Assessment and Plan:  Brian Burns is a 69 y.o. male presenting with acute on chronic renal failure secondary to obstruction. PMH is significant for urethral strictures, CAD, HTN, CVA 2, BPH, DM 2.   Acute on chronic renal failure secondary to obstruction: Urinary catheter in place. Cr 7.48 on admission >> 3.31 this AM. Renal US showed hyperechoic kidneys bilaterally with no hydronephrosis or lesions.  - Nephrology following, appreciate recs - continue IVFs - Will need to d/c with foley with close Urology follow-up - Strict I&Os - SLIV. F/u PO intake off IVF - Continue finasteride 5 mg, Flomax daily (increase Flomax to max dose of 0.8mg )  New-onset atrial fibrillation with RVR: Resolved with spontaneous cardioversion.  Bradycardic, so dilt drip d/c'd by cardiology 12/20. Likely precipitated by acute illness - monitor for recurrence - Off diltiazem and HR not current able to tolerate CCB or BB - Coumadin per pharmacy - Will need cardiology follow-up outpatient after discharge  UTI 2/2 Staph Aureus: Likely 2/2 recent instrumentation   - doxycycline day 4/10; will continue despite only 10,000 colonies and negative blood cultures due to catheter  - F/u blood cultures: NG x2 days   Pancreatitis: Lipase 169. US showed gallbladder sludge with medical renal disease and mild distal AAA but pancreas was not visualized.  - Continue Oxy IR, Zofran prn  Hiccups: Intractable x2 days, but now  improving. Potentially due to diaphragmatic irritation by pancreatitis given increased lipase. - Continue Haldol 0.5 mg PO q6h  Hyperkalemia, Hypernatremia: Resolved - Continue to monitor  Cardiac: CAD, HTN, PAD, HF, AAA (~ 2.94x3.14cm). Echo 12/19 with low normal LVF and EF 50-55%. BPs consistently elevated, but patient is in pain - Continue home Norvasc 10 mg daily - Continue to monitor - HR not currently able to tolerate BBlocker or CCB  Type II Diabetes mellitus: A1C 6.8 (12/16). - Holding Metformin - SSI sensitive AC  H/o CVA - Continue home: ASA 81 mg daily, Lipitor 40 mg daily at bedtime  FEN/GI: Heart healthy/carb mod diet; SLIV Prophylaxis: coumadin (heparin stopped due to bleeding)  Disposition: Transfer to tele; discharge pending improvement of renal failure and tolerance of PO  Subjective:  Last episode of vomiting last night. Was able to tolerate having some breakfast without N/V. Hiccups are improving.   Objective: Temp:  [97.7 F (36.5 C)-98.3 F (36.8 C)] 97.7 F (36.5 C) (12/22 0642) Pulse Rate:  [60-71] 71 (12/22 0642) Resp:  [12-18] 16 (12/22 0642) BP: (129-157)/(85-103) 129/88 mmHg (12/22 0642) SpO2:  [98 %-100 %] 100 % (12/22 1478) Physical Exam General: lying in bed in NAD Cardiovascular: distant heart sounds but RRR, no murmurs appreciated Respiratory: CTAB, no wheezes, normal WOB Abdomen: soft, non-tender, non-distended, +BS Neuro: alert and oriented, no focal deficits Psych: appropriate mood and affect   Laboratory:  Recent Labs Lab 01/17/15 0321 01/18/15 0439 01/19/15 0655  WBC 8.4 8.0 8.8  HGB 12.0* 11.6* 12.0*  HCT 37.7* 35.4* 36.1*  PLT 211 194 176    Recent Labs Lab 01/14/15 1827  01/16/15  1350 01/17/15 0321 01/18/15 0439  NA 150*  < > 146* 144 139  K 6.2*  < > 4.3 4.1 4.0  CL 116*  < > 115* 114* 111  CO2 20*  < > 17* 20* 20*  BUN 139*  < > 88* 75* 56*  CREATININE 7.48*  < > 4.99* 4.64* 3.75*  CALCIUM 9.7  < > 8.5* 8.6*  8.6*  PROT 9.7*  --   --   --   --   BILITOT 0.7  --   --   --   --   ALKPHOS 125  --   --   --   --   ALT 33  --   --   --   --   AST 29  --   --   --   --   GLUCOSE 135*  < > 131* 128* 137*  < > = values in this interval not displayed.  Imaging: US Renal  01/15/2015  CLINICAL DATA:  Acute kidney injury. EXAM: RENAL / URINARY TRACT ULTRASOUND COMPLETE COMPARISON:  CT of the abdomen and pelvis 09/02/2012. FINDINGS: Right Kidney: Length: 12.6 cm, within normal limits. The right kidney is hyperechoic to the index organ, the liver. No mass or hydronephrosis visualized. Left Kidney: Length: 10.4 cm, within normal limits with. The left kidney is somewhat hyperechoic as well. No mass or hydronephrosis visualized. Bladder: A Foley catheter is present within the urinary bladder. IMPRESSION: The kidneys are hyperechoic bilaterally without focal mass lesion or hydronephrosis. This is nonspecific, but can be seen in the setting of medical renal disease. Electronically Signed   By: Marin Roberts M.D.   On: 01/15/2015 13:41   Dg Abd Acute W/chest  01/14/2015  CLINICAL DATA:  Vomiting, lower abdominal pain. History of hypertension, coronary artery disease, CVA, diabetes. Former smoker. EXAM: DG ABDOMEN ACUTE W/ 1V CHEST COMPARISON:  None. FINDINGS: Single view of the chest: Heart size is normal. Overall cardiomediastinal silhouette appears stable in size and configuration compared to an earlier chest x-ray of 10/02/2013. Lungs are clear. No pleural effusions seen. Osseous and soft tissue structures about the chest are unremarkable. Supine and decubitus views of the abdomen: Bowel gas pattern is nonobstructive. No evidence of soft tissue mass or abnormal fluid collection. No evidence of free intraperitoneal air. Vascular calcifications noted within the lower abdomen and pelvis. No acute/ significant osseous abnormality seen. IMPRESSION: 1. Lungs are clear and there is no evidence of acute cardiopulmonary  abnormality. 2. Nonobstructive bowel gas pattern and no evidence of acute intra-abdominal abnormality. Electronically Signed   By: Bary Richard M.D.   On: 01/14/2015 18:48    Arvilla Market, DO 01/19/2015, 8:22 AM PGY-1, [facility] Family Medicine FPTS Intern pager: [phone], text pages welcome

## 2015-01-19 NOTE — Progress Notes (Signed)
S: Appetite improving but does like the food O:BP 124/75 mmHg  Pulse 73  Temp(Src) 97.6 F (36.4 C) (Oral)  Resp 16  Ht 6\' 4"  (1.93 m)  Wt 86.2 kg (190 lb 0.6 oz)  BMI 23.14 kg/m2  SpO2 99%  Intake/Output Summary (Last 24 hours) at 01/19/15 0919 Last data filed at 01/19/15 0854  Gross per 24 hour  Intake    860 ml  Output   2010 ml  Net  -1150 ml   Weight change:  CHE:KBTCY and alert CVS: RRR Resp:clear Abd:+ BS NTND no HSM Ext:no edema NEURO:CNI Ox3 no asterixis   . acetaminophen  650 mg Oral TID  . amLODipine  10 mg Oral Daily  . aspirin EC  81 mg Oral Daily  . atorvastatin  40 mg Oral q1800  . Chlorhexidine Gluconate Cloth  6 each Topical Q0600  . finasteride  5 mg Oral Daily  . insulin aspart  0-9 Units Subcutaneous TID WC  . mupirocin ointment  1 application Nasal BID  . pantoprazole  40 mg Oral Daily  . polyethylene glycol  17 g Oral Daily  . senna-docusate  1 tablet Oral BID  . sodium chloride  3 mL Intravenous Q12H  . tamsulosin  0.8 mg Oral QHS  . warfarin  2.5 mg Oral ONCE-1800  . Warfarin - Pharmacist Dosing Inpatient   Does not apply q1800   No results found. BMET    Component Value Date/Time   NA 140 01/19/2015 0805   K 4.2 01/19/2015 0805   CL 109 01/19/2015 0805   CO2 22 01/19/2015 0805   GLUCOSE 126* 01/19/2015 0805   BUN 45* 01/19/2015 0805   CREATININE 3.31* 01/19/2015 0805   CREATININE 1.30 03/18/2014 1234   CALCIUM 8.7* 01/19/2015 0805   GFRNONAA 18* 01/19/2015 0805   GFRNONAA 71 05/28/2013 1442   GFRAA 21* 01/19/2015 0805   GFRAA 82 05/28/2013 1442   CBC    Component Value Date/Time   WBC 8.8 01/19/2015 0655   RBC 4.27 01/19/2015 0655   HGB 12.0* 01/19/2015 0655   HCT 36.1* 01/19/2015 0655   PLT 176 01/19/2015 0655   MCV 84.5 01/19/2015 0655   MCH 28.1 01/19/2015 0655   MCHC 33.2 01/19/2015 0655   RDW 14.7 01/19/2015 0655   LYMPHSABS 1.4 01/14/2015 1827   MONOABS 0.9 01/14/2015 1827   EOSABS 0.0 01/14/2015 1827   BASOSABS 0.0 01/14/2015 1827     Assessment: 1. Acute on CKD 3 most likely secondary to obstruction, at least partial.  Scr improving with placement of foley despite [place] not showing hydro.  Suspect some volume depletion also playing a role.  UO OK and Scr cont to trend down 2. HTN 3. Urethral stricture, SP dilation 10/16 4. HyperNa, resolved 5. HyperK resolved 6. A fib, now in SR  On coumadin  7. Urine growing MRSA, only 10,000 colonies but would consider treating since has foley (at least for few more days)  Plan: 1. Renal fx should cont to improve to baseline.  He needs FU with urology.  Will sign off, call if further renal issues   Arilyn Brierley T

## 2015-01-19 NOTE — Progress Notes (Signed)
ANTICOAGULATION CONSULT NOTE - Follow Up Consult  Pharmacy Consult for Warfarin Indication: atrial fibrillation  Allergies  Allergen Reactions  . Novocain [Procaine Hcl] Nausea And Vomiting    Patient Measurements: Height: 6\' 4"  (193 cm) Weight: 190 lb 0.6 oz (86.2 kg) IBW/kg (Calculated) : 86.8   Vital Signs: Temp: 97.6 F (36.4 C) (12/22 0837) Temp Source: Oral (12/22 0837) BP: 124/75 mmHg (12/22 0837) Pulse Rate: 73 (12/22 0837)  Labs:  Recent Labs  01/16/15 1057 01/16/15 1350 01/16/15 1652  01/17/15 0321 01/18/15 0439 01/19/15 0655  HGB  --   --   --   < > 12.0* 11.6* 12.0*  HCT  --   --   --   --  37.7* 35.4* 36.1*  PLT  --   --   --   --  211 194 176  LABPROT  --   --   --   < > 17.4* 18.4* 27.7*  INR  --   --   --   < > 1.42 1.52* 2.63*  HEPARINUNFRC  --  0.98*  --   --   --   --   --   CREATININE  --  4.99*  --   --  4.64* 3.75*  --   TROPONINI 0.03  --  0.04*  --   --   --   --   < > = values in this interval not displayed.  Estimated Creatinine Clearance: 23.3 mL/min (by C-G formula based on Cr of 3.75).   Assessment: 59 YOM admitted with new onset Afib -now converted to NSR on warfarin. INR up at 2.63 today after 3 doses of warfarin 7.5mg .  CBC low, but stable. No further bleeding noted now that patient is off IV heparin.   Note: patient is on doxycycline currently which can potentiate INR. I did speak with Dr. Earlene Plater about stopping this as the staph growing in his urine is likely a contaminant d/t frequent caths, and his BCX are negative to date.  Goal of Therapy:  INR 2-3 Monitor platelets by anticoagulation protocol: Yes   Plan:  -Warfarin 2.5 mg x 1 tonight  -Daily PT/INR- watch with doxycycline continuation vs stopping -Will continue to monitor for any signs/symptoms of bleeding -Will provide education prior to discharge   Dartanyan Deasis D. Josslin Sanjuan, PharmD, BCPS Clinical Pharmacist Pager: [phone] 01/19/2015 8:52 AM

## 2015-01-19 NOTE — Discharge Instructions (Signed)

## 2015-01-20 DIAGNOSIS — R29818 Other symptoms and signs involving the nervous system: Secondary | ICD-10-CM

## 2015-01-20 DIAGNOSIS — N320 Bladder-neck obstruction: Secondary | ICD-10-CM

## 2015-01-20 DIAGNOSIS — R2689 Other abnormalities of gait and mobility: Secondary | ICD-10-CM | POA: Diagnosis present

## 2015-01-20 DIAGNOSIS — K8500 Idiopathic acute pancreatitis without necrosis or infection: Secondary | ICD-10-CM

## 2015-01-20 HISTORY — DX: Bladder-neck obstruction: N32.0

## 2015-01-20 LAB — BASIC METABOLIC PANEL
Anion gap: 10 (ref 5–15)
BUN: 47 mg/dL — AB (ref 6–20)
CALCIUM: 8.5 mg/dL — AB (ref 8.9–10.3)
CO2: 19 mmol/L — ABNORMAL LOW (ref 22–32)
CREATININE: 3.07 mg/dL — AB (ref 0.61–1.24)
Chloride: 111 mmol/L (ref 101–111)
GFR calc Af Amer: 23 mL/min — ABNORMAL LOW (ref >60)
GFR, EST NON AFRICAN AMERICAN: 20 mL/min — AB (ref >60)
GLUCOSE: 115 mg/dL — AB (ref 65–99)
POTASSIUM: 4.2 mmol/L (ref 3.5–5.1)
SODIUM: 140 mmol/L (ref 135–145)

## 2015-01-20 LAB — CBC
HEMATOCRIT: 34.7 % — AB (ref 39.0–52.0)
HEMOGLOBIN: 11.4 g/dL — AB (ref 13.0–17.0)
MCH: 27.8 pg (ref 26.0–34.0)
MCHC: 32.9 g/dL (ref 30.0–36.0)
MCV: 84.6 fL (ref 78.0–100.0)
Platelets: 178 10*3/uL (ref 150–400)
RBC: 4.1 MIL/uL — ABNORMAL LOW (ref 4.22–5.81)
RDW: 14.5 % (ref 11.5–15.5)
WBC: 8.5 10*3/uL (ref 4.0–10.5)

## 2015-01-20 LAB — PROTIME-INR
INR: 3.45 — AB (ref 0.00–1.49)
PROTHROMBIN TIME: 34 s — AB (ref 11.6–15.2)

## 2015-01-20 LAB — GLUCOSE, CAPILLARY
GLUCOSE-CAPILLARY: 128 mg/dL — AB (ref 65–99)
GLUCOSE-CAPILLARY: 102 mg/dL — AB (ref 65–99)
Glucose-Capillary: 119 mg/dL — ABNORMAL HIGH (ref 65–99)

## 2015-01-20 MED ORDER — DOXYCYCLINE HYCLATE 100 MG PO TABS: 100.0000 mg | ORAL_TABLET | Freq: Two times a day (BID) | ORAL | Status: DC

## 2015-01-20 MED ORDER — MUPIROCIN 2 % EX OINT
TOPICAL_OINTMENT | CUTANEOUS | Status: AC
  Filled 2015-01-20: qty 22

## 2015-01-20 MED ORDER — WARFARIN SODIUM 2.5 MG PO TABS: 2.5000 mg | ORAL_TABLET | Freq: Every day | ORAL | Status: DC

## 2015-01-20 NOTE — Evaluation (Signed)
Occupational Therapy Evaluation Patient Details Name: Brian Burns MRN: [id number] DOB: [birth year] Today's Date: 01/20/2015    History of Present Illness Brian Burns is a 69 y.o. male presenting with acute on chronic renal failure secondary to obstruction. PMH is significant for urethral strictures, CAD, HTN, CVA 2, BPH, DM 2.   Clinical Impression   PT admitted with chronic renal failure due to obstruction. Pt currently with functional limitiations due to the deficits listed below (see OT problem list). PTA living at home independently  Pt will benefit from skilled OT to increase their independence and safety with adls and balance to allow discharge HHOT.Pt demonstrates fall risk with transfers and could benefit from continued OT services.      Follow Up Recommendations  Home health OT    Equipment Recommendations  3 in 1 bedside comode;Other (comment) (rw)    Recommendations for Other Services       Precautions / Restrictions Precautions Precautions: Fall      Mobility Bed Mobility Overal bed mobility: Modified Independent                Transfers Overall transfer level: Needs assistance Equipment used: Rolling walker (2 wheeled);None Transfers: Sit to/from Stand Sit to Stand: Min guard         General transfer comment: cues for hand placement    Balance Overall balance assessment: Needs assistance Sitting-balance support: No upper extremity supported;Feet supported Sitting balance-Leahy Scale: Good     Standing balance support: Single extremity supported;During functional activity Standing balance-Leahy Scale: Fair                              ADL Overall ADL's : Needs assistance/impaired Eating/Feeding: Independent   Grooming: Wash/dry hands;Min guard;Standing               Lower Body Dressing: Min guard;Sit to/from stand   Toilet Transfer: Moderate assistance;RW;Grab Paramedic Details  (indicate cue type and reason): pt required physical (A) to stand from commode even with grab bar Toileting- Clothing Manipulation and Hygiene: Min guard;Sitting/lateral lean       Functional mobility during ADLs: Min guard General ADL Comments: Pt reports dizziness with initial sit<>stand and then again with ambulation. pt allowed incr time and rest break walking with symptoms resolving     Vision Vision Assessment?: No apparent visual deficits   Perception     Praxis      Pertinent Vitals/Pain Pain Assessment: No/denies pain     Hand Dominance Right   Extremity/Trunk Assessment Upper Extremity Assessment Upper Extremity Assessment: Overall WFL for tasks assessed   Lower Extremity Assessment Lower Extremity Assessment: Defer to PT evaluation   Cervical / Trunk Assessment Cervical / Trunk Assessment: Normal   Communication Communication Communication: Expressive difficulties   Cognition Arousal/Alertness: Awake/alert Behavior During Therapy: WFL for tasks assessed/performed Overall Cognitive Status: Within Functional Limits for tasks assessed                     General Comments       Exercises       Shoulder Instructions      Home Living Family/patient expects to be discharged to:: Private residence Living Arrangements: Spouse/significant other Available Help at Discharge: Family;Available 24 hours/day Type of Home: House Home Access: Stairs to enter [employer] of Steps: 5 Entrance Stairs-Rails: Right;Left Home Layout: One level     Bathroom Shower/Tub: Walk-in shower;Curtain  Bathroom Toilet: Standard     Home Equipment: Cane - single point;Grab bars - tub/shower          Prior Functioning/Environment Level of Independence: Independent with assistive device(s)        Comments: states he uses a cane to ambulate at times when outdoors.    OT Diagnosis: Generalized weakness;Acute pain   OT Problem List: Decreased  strength;Decreased activity tolerance;Impaired balance (sitting and/or standing);Decreased safety awareness;Decreased knowledge of use of DME or AE;Decreased knowledge of precautions   OT Treatment/Interventions: Self-care/ADL training;Therapeutic exercise;DME and/or AE instruction;Therapeutic activities;Patient/family education;Balance training    OT Goals(Current goals can be found in the care plan section) Acute Rehab OT Goals Patient Stated Goal: Go home soon OT Goal Formulation: With patient Time For Goal Achievement: 02/03/15 Potential to Achieve Goals: Good  OT Frequency: Min 2X/week   Barriers to D/C:            Co-evaluation              End of Session Equipment Utilized During Treatment: Gait belt;Rolling walker Nurse Communication: Mobility status;Precautions  Activity Tolerance: Patient tolerated treatment well Patient left: in chair;with call bell/phone within reach;with chair alarm set   Time: 1130-1158 OT Time Calculation (min): 28 min Charges:  OT General Charges $OT Visit: 1 Procedure OT Evaluation $Initial OT Evaluation Tier I: 1 Procedure OT Treatments $Self Care/Home Management : 8-22 mins G-Codes:    Brian Burns February 16, 2015, 1:29 PM  Pager: [phone]

## 2015-01-20 NOTE — Progress Notes (Signed)
01/20/2015 5:26 PM  Received discharge orders for patient.  Pt called daughter to arrange transportation home, at that point said it would be about 20-30 minutes before her arrival.  We are now going on 1.5 hours later.  I have called daughter twice, and left one message.  Pt is currently trying to get a hold of her as well.  He states she is a hairdresser so she may be with a client.  Will continue to monitor patient and attempt to arrange transport home. Theadora Rama

## 2015-01-20 NOTE — Progress Notes (Signed)
ANTICOAGULATION CONSULT NOTE - Follow Up Consult  Pharmacy Consult for Warfarin Indication: atrial fibrillation  Allergies  Allergen Reactions  . Novocain [Procaine Hcl] Nausea And Vomiting    Patient Measurements: Height: 6\' 4"  (193 cm) Weight: 190 lb 0.6 oz (86.2 kg) IBW/kg (Calculated) : 86.8   Vital Signs: Temp: 97.5 F (36.4 C) (12/23 1002) Temp Source: Oral (12/23 1002) BP: 131/92 mmHg (12/23 1002) Pulse Rate: 78 (12/23 1002)  Labs:  Recent Labs  01/18/15 0439 01/19/15 0655 01/19/15 0805 01/20/15 0604  HGB 11.6* 12.0*  --  11.4*  HCT 35.4* 36.1*  --  34.7*  PLT 194 176  --  178  LABPROT 18.4* 27.7*  --  34.0*  INR 1.52* 2.63*  --  3.45*  CREATININE 3.75*  --  3.31* 3.07*    Estimated Creatinine Clearance: 28.5 mL/min (by C-G formula based on Cr of 3.07).   Assessment: 77 YOM admitted with new onset Afib -now converted to NSR on warfarin. INR up at 3.45 today after 3 doses of warfarin 7.5mg  and 1 dose of 2.5mg . CBC low, but stable. No further bleeding noted now that patient is off IV heparin.   Note: patient is on doxycycline currently which can potentiate INR. BCx ngtd. Plan is to treat UTI for 10 days since he has frequent caths and will be discharged with a foley.  Goal of Therapy:  INR 2-3 Monitor platelets by anticoagulation protocol: Yes   Plan:  -hold warfarin tonight. Recommend resuming warfarin on 12/24- 2.5mg  daily with an INR check on 12/26 or 12/27   -Will continue to monitor for any signs/symptoms of bleeding while he is here  Leotis Shames D. Hanaan Gancarz, PharmD, BCPS Clinical Pharmacist Pager: [phone] 01/20/2015 10:11 AM

## 2015-01-20 NOTE — Care Management Note (Signed)
Case Management Note  Patient Details  Name: Brian Burns MRN: [id number] Date of Birth: 02/22/1945  Subjective/Objective:         CM following for progression and d/c planning.           Action/Plan: 01/20/2015 Met with pt who states that his daughter assist with his care and that she has arrange a followup appointment with urology for next week. He has has a foley at home previously and does not feel that he needs HHRN followup. Will follow for any changes.   Expected Discharge Date:      01/21/2015            Expected Discharge Plan:  Home/Self Care  In-House Referral:  NA  Discharge planning Services  NA  Post Acute Care Choice:  NA Choice offered to:  NA  DME Arranged:  N/A DME Agency:  NA  HH Arranged:  NA HH Agency:  NA  Status of Service:  Completed, signed off  Medicare Important Message Given:  Yes Date Medicare IM Given:    Medicare IM give by:    Date Additional Medicare IM Given:    Additional Medicare Important Message give by:     If discussed at [place] of Stay Meetings, dates discussed:    Additional Comments:  Adron Bene, RN 01/20/2015, 10:49 AM

## 2015-01-20 NOTE — Care Management Important Message (Signed)
Important Message  Patient Details  Name: KIAAN HINELY MRN: [id number] Date of Birth: March 13, 1945   Medicare Important Message Given:  Yes    Ahmiyah Coil, Annamarie Major, RN 01/20/2015, 10:47 AM

## 2015-01-20 NOTE — Progress Notes (Signed)
01/20/2015 6:23 PM  Reviewed discharge instructions with patient, his wife, and his daughter.  All asked questions appropriately and verbalized understanding of information.  IV removed, telemetry removed.  Assisted getting patient dressed and into wheelchair.  Foley intact per MD orders at discharge.  Pt escorted out via wheelchair to [employer] entrance. Theadora Rama

## 2015-01-20 NOTE — Progress Notes (Signed)
Family Medicine Teaching Service Daily Progress Note Intern Pager: [phone]  Patient name: Brian Burns Medical record number: [id number] Date of birth: Mar 20, 1945 Age: 69 y.o. Gender: male  Primary Care Provider: Shirlee Latch, MD Consultants: Renal Code Status: FULL  Pt Overview and Major Events to Date:  12/18: admit for acute on chronic renal failure secondary to obstruction 12/19: new-onset a.fib with RVR - resolved 12/20: spontaneously cardioverted into NSR, off dilt drip 2/2 bradycardia  Assessment and Plan:  Brian Burns is a 69 y.o. male presenting with acute on chronic renal failure secondary to obstruction. PMH is significant for urethral strictures, CAD, HTN, CVA 2, BPH, DM 2.   Acute on chronic renal failure secondary to obstruction: Urinary catheter in place. Cr 7.48 on admission >> 3.07 this AM. Renal US showed hyperechoic kidneys bilaterally with no hydronephrosis or lesions.  - Nephrology signed off; renal function continuing to improve - Will need to d/c with foley with close Urology follow-up - Strict I&Os;  SLIV.  - Continue finasteride 5 mg, Flomax daily (increase Flomax to max dose of 0.8mg )  New-onset atrial fibrillation with RVR: Resolved with spontaneous cardioversion.  Bradycardic, so dilt drip d/c'd by cardiology 12/20. Likely precipitated by acute illness - monitor for recurrence - Off diltiazem and HR not current able to tolerate CCB or BB - Coumadin per pharmacy; INR today 3.45 - Will need cardiology follow-up outpatient after discharge  UTI 2/2 Staph Aureus: Likely 2/2 recent instrumentation. MRSA colony count >= 10^4 in association with symptoms meets criteria for UTI. - doxycycline day 5/10; will continue despite only 10,000 colonies and negative blood cultures due to catheter  - F/u blood cultures: NG x3 days   Pancreatitis: Lipase 169. US showed gallbladder sludge with medical renal disease and mild distal AAA but pancreas was not  visualized. Abdominal pain improved.  - Continue Oxy IR, Zofran prn  Hiccups, Resolved: Potentially due to diaphragmatic irritation by pancreatitis given increased lipase. - Continue Haldol 0.5 mg PO q6h  Hyperkalemia, Hypernatremia: Resolved - Continue to monitor  Cardiac: CAD, HTN, PAD, HF, AAA (~ 2.94x3.14cm). Echo 12/19 with low normal LVF and EF 50-55%. BPs consistently elevated, but patient is in pain - Continue home Norvasc 10 mg daily - Continue to monitor - HR not currently able to tolerate BBlocker or CCB  Type II Diabetes mellitus: A1C 6.8 (12/16). CBGs 110-150. - Holding Metformin - SSI sensitive AC - received only 1U yesterday  H/o CVA - Continue home: ASA 81 mg daily, Lipitor 40 mg daily at bedtime  FEN/GI: Heart healthy/carb mod diet; SLIV Prophylaxis: coumadin (heparin stopped due to bleeding)  Disposition: Discharge pending PT/OT recs; medically stable.  Subjective:  Doing well this morning. No complaints. States he does not like the hospital food and ready to go home. Denies any more hiccups. Wants to go home.   Objective: Temp:  [97.6 F (36.4 C)-98.3 F (36.8 C)] 98.2 F (36.8 C) (12/23 0614) Pulse Rate:  [70-87] 87 (12/23 0614) Resp:  [16-19] 19 (12/23 0614) BP: (124-146)/(75-98) 129/89 mmHg (12/23 0614) SpO2:  [99 %-100 %] 99 % (12/23 1478) Physical Exam General: lying in bed in NAD Cardiovascular: RRR, no murmurs appreciated Respiratory: CTAB, no wheezes, normal WOB Abdomen: soft, non-tender, non-distended, +BS Neuro: alert and oriented, no focal deficits Psych: appropriate mood and affect   Laboratory:  Recent Labs Lab 01/18/15 0439 01/19/15 0655 01/20/15 0604  WBC 8.0 8.8 8.5  HGB 11.6* 12.0* 11.4*  HCT 35.4* 36.1* 34.7*  PLT 194 176 178    Recent Labs Lab 01/14/15 1827  01/18/15 0439 01/19/15 0805 01/20/15 0604  NA 150*  < > 139 140 140  K 6.2*  < > 4.0 4.2 4.2  CL 116*  < > 111 109 111  CO2 20*  < > 20* 22 19*  BUN 139*  <  > 56* 45* 47*  CREATININE 7.48*  < > 3.75* 3.31* 3.07*  CALCIUM 9.7  < > 8.6* 8.7* 8.5*  PROT 9.7*  --   --   --   --   BILITOT 0.7  --   --   --   --   ALKPHOS 125  --   --   --   --   ALT 33  --   --   --   --   AST 29  --   --   --   --   GLUCOSE 135*  < > 137* 126* 115*  < > = values in this interval not displayed.  Imaging: US Renal  01/15/2015  CLINICAL DATA:  Acute kidney injury. EXAM: RENAL / URINARY TRACT ULTRASOUND COMPLETE COMPARISON:  CT of the abdomen and pelvis 09/02/2012. FINDINGS: Right Kidney: Length: 12.6 cm, within normal limits. The right kidney is hyperechoic to the index organ, the liver. No mass or hydronephrosis visualized. Left Kidney: Length: 10.4 cm, within normal limits with. The left kidney is somewhat hyperechoic as well. No mass or hydronephrosis visualized. Bladder: A Foley catheter is present within the urinary bladder. IMPRESSION: The kidneys are hyperechoic bilaterally without focal mass lesion or hydronephrosis. This is nonspecific, but can be seen in the setting of medical renal disease. Electronically Signed   By: Marin Roberts M.D.   On: 01/15/2015 13:41   Dg Abd Acute W/chest  01/14/2015  CLINICAL DATA:  Vomiting, lower abdominal pain. History of hypertension, coronary artery disease, CVA, diabetes. Former smoker. EXAM: DG ABDOMEN ACUTE W/ 1V CHEST COMPARISON:  None. FINDINGS: Single view of the chest: Heart size is normal. Overall cardiomediastinal silhouette appears stable in size and configuration compared to an earlier chest x-ray of 10/02/2013. Lungs are clear. No pleural effusions seen. Osseous and soft tissue structures about the chest are unremarkable. Supine and decubitus views of the abdomen: Bowel gas pattern is nonobstructive. No evidence of soft tissue mass or abnormal fluid collection. No evidence of free intraperitoneal air. Vascular calcifications noted within the lower abdomen and pelvis. No acute/ significant osseous abnormality seen.  IMPRESSION: 1. Lungs are clear and there is no evidence of acute cardiopulmonary abnormality. 2. Nonobstructive bowel gas pattern and no evidence of acute intra-abdominal abnormality. Electronically Signed   By: Bary Richard M.D.   On: 01/14/2015 18:48    Pincus Large, DO 01/20/2015, 7:28 AM PGY-2, [facility] FPTS Intern pager: [phone], text pages welcome

## 2015-01-20 NOTE — Progress Notes (Signed)
Physical Therapy Evaluation Patient Details Name: Brian Burns MRN: [id number] DOB: 1945/07/27 Today's Date: 01/20/2015   History of Present Illness  Brian Burns is a 69 y.o. male presenting with acute on chronic renal failure secondary to obstruction. PMH is significant for urethral strictures, CAD, HTN, CVA 2, BPH, DM 2.  Clinical Impression  Pt admitted with the above complications. Pt currently with functional limitations due to the deficits listed below (see PT Problem List). Demonstrates instability with a rolling walker but able to self-correct without physical assist. Previously used a cane for outdoor mobility. Presents with generalized weakness. States he has 24/7 assist at home from wife and additional help from family if needed. Pt will benefit from skilled PT to increase their independence and safety with mobility to allow discharge to the venue listed below.       Follow Up Recommendations Home health PT;Supervision for mobility/OOB    Equipment Recommendations  Rolling walker with 5" wheels    Recommendations for Other Services       Precautions / Restrictions Precautions Precautions: Fall Restrictions Weight Bearing Restrictions: No      Mobility  Bed Mobility Overal bed mobility: Modified Independent             General bed mobility comments: extra time  Transfers Overall transfer level: Needs assistance Equipment used: Rolling walker (2 wheeled);None Transfers: Sit to/from Stand Sit to Stand: Min guard         General transfer comment: min guard for safety VC for technique and hand placement. More stable when performed with RW.  Ambulation/Gait Ambulation/Gait assistance: Min guard Ambulation Distance (Feet): 95 Feet Assistive device: Rolling walker (2 wheeled) Gait Pattern/deviations: Step-through pattern;Decreased stride length;Trunk flexed;Staggering left Gait velocity: decreased Gait velocity interpretation: Below normal speed for  age/gender General Gait Details: Educated on safe DME use with a rolling walker. Cues for upright posture. One episode of instability but able to self correct with support from RW.  Stairs Stairs:  (Refuses to attempt today.)          Wheelchair Mobility    Modified Rankin (Stroke Patients Only)       Balance Overall balance assessment: Needs assistance Sitting-balance support: No upper extremity supported;Feet supported Sitting balance-Leahy Scale: Good     Standing balance support: No upper extremity supported Standing balance-Leahy Scale: Fair                               Pertinent Vitals/Pain Pain Assessment: No/denies pain    Home Living Family/patient expects to be discharged to:: Private residence Living Arrangements: Spouse/significant other Available Help at Discharge: Family;Available 24 hours/day Type of Home: House Home Access: Stairs to enter Entrance Stairs-Rails: Doctor, general practice of Steps: 5 Home Layout: One level Home Equipment: Cane - single point;Grab bars - tub/shower      Prior Function Level of Independence: Independent with assistive device(s)         Comments: states he uses a cane to ambulate at times when outdoors.     Hand Dominance   Dominant Hand: Right    Extremity/Trunk Assessment   Upper Extremity Assessment: Defer to OT evaluation           Lower Extremity Assessment: Generalized weakness         Communication   Communication: Expressive difficulties  Cognition Arousal/Alertness: Awake/alert Behavior During Therapy: WFL for tasks assessed/performed Overall Cognitive Status: Within Functional Limits for tasks assessed  General Comments General comments (skin integrity, edema, etc.): HR up to 121 while ambulating.    Exercises        Assessment/Plan    PT Assessment Patient needs continued PT services  PT Diagnosis Difficulty  walking;Abnormality of gait;Generalized weakness   PT Problem List Decreased strength;Decreased activity tolerance;Decreased balance;Decreased mobility;Decreased coordination;Decreased knowledge of use of DME  PT Treatment Interventions DME instruction;Gait training;Stair training;Functional mobility training;Therapeutic activities;Therapeutic exercise;Balance training;Neuromuscular re-education;Patient/family education   PT Goals (Current goals can be found in the Care Plan section) Acute Rehab PT Goals Patient Stated Goal: Go home soon PT Goal Formulation: With patient Time For Goal Achievement: 02/03/15 Potential to Achieve Goals: Good    Frequency Min 3X/week   Barriers to discharge        Co-evaluation               End of Session Equipment Utilized During Treatment: Gait belt Activity Tolerance: Patient tolerated treatment well Patient left: in bed;with call bell/phone within reach;with bed alarm set Nurse Communication: Mobility status         Time: 3474-2595 PT Time Calculation (min) (ACUTE ONLY): 22 min   Charges:   PT Evaluation $Initial PT Evaluation Tier I: 1 Procedure     PT G CodesBerton Mount 01/20/2015, 10:19 AM  Charlsie Merles, PT [phone]

## 2015-01-20 NOTE — Care Management Note (Signed)
Case Management Note  Patient Details  Name: DEONTA BOMBERGER MRN: [id number] Date of Birth: January 09, 1946  Subjective/Objective:    CM following for progression and d/c planning.                Action/Plan: 01/20/2015 Noted orders for HHPT and HHOT met with pt who agreed to allow this CM to call his wife to arrange services. Pt wife, Mrs Dineen agreed to [place] and [place] with [facility]. [place] notified and DME ordered from [facility] for delivery to pt room.   Expected Discharge Date:       01/20/2015           Expected Discharge Plan:  [place]  In-House Referral:  NA  Discharge planning Services  NA  Post Acute Care Choice:  NA, Durable Medical Equipment, Home Health Choice offered to:  NA, Spouse  DME Arranged:  3-N-1, Walker rolling DME Agency:  NA, [place] Arranged:  PT, OT [place] Agency:  [place]  Status of Service:  Completed, signed off  Medicare Important Message Given:  Yes Date Medicare IM Given:    Medicare IM give by:    Date Additional Medicare IM Given:    Additional Medicare Important Message give by:     If discussed at [place] of Stay Meetings, dates discussed:    Additional Comments:  Adron Bene, RN 01/20/2015, 3:02 PM

## 2015-01-21 LAB — CULTURE, BLOOD (ROUTINE X 2)
CULTURE: NO GROWTH
Culture: NO GROWTH

## 2015-01-22 ENCOUNTER — Emergency Department (HOSPITAL_COMMUNITY): Payer: Commercial Managed Care - HMO

## 2015-01-22 ENCOUNTER — Other Ambulatory Visit: Payer: Self-pay

## 2015-01-22 ENCOUNTER — Emergency Department (HOSPITAL_COMMUNITY)
Admission: EM | Admit: 2015-01-22 | Discharge: 2015-01-22 | Disposition: A | Discharge status: Home / Self Care | Payer: Commercial Managed Care - HMO | Source: Home / Self Care | Attending: Emergency Medicine | Admitting: Emergency Medicine

## 2015-01-22 ENCOUNTER — Encounter (HOSPITAL_COMMUNITY): Payer: Self-pay | Admitting: Emergency Medicine

## 2015-01-22 DIAGNOSIS — K219 Gastro-esophageal reflux disease without esophagitis: Secondary | ICD-10-CM | POA: Insufficient documentation

## 2015-01-22 DIAGNOSIS — M549 Dorsalgia, unspecified: Secondary | ICD-10-CM | POA: Insufficient documentation

## 2015-01-22 DIAGNOSIS — R1013 Epigastric pain: Secondary | ICD-10-CM | POA: Diagnosis not present

## 2015-01-22 DIAGNOSIS — E119 Type 2 diabetes mellitus without complications: Secondary | ICD-10-CM | POA: Insufficient documentation

## 2015-01-22 DIAGNOSIS — Z87438 Personal history of other diseases of male genital organs: Secondary | ICD-10-CM | POA: Insufficient documentation

## 2015-01-22 DIAGNOSIS — Z79899 Other long term (current) drug therapy: Secondary | ICD-10-CM | POA: Insufficient documentation

## 2015-01-22 DIAGNOSIS — Z792 Long term (current) use of antibiotics: Secondary | ICD-10-CM | POA: Insufficient documentation

## 2015-01-22 DIAGNOSIS — Z87448 Personal history of other diseases of urinary system: Secondary | ICD-10-CM | POA: Insufficient documentation

## 2015-01-22 DIAGNOSIS — Z87891 Personal history of nicotine dependence: Secondary | ICD-10-CM | POA: Insufficient documentation

## 2015-01-22 DIAGNOSIS — I4891 Unspecified atrial fibrillation: Secondary | ICD-10-CM | POA: Insufficient documentation

## 2015-01-22 DIAGNOSIS — G8929 Other chronic pain: Secondary | ICD-10-CM | POA: Insufficient documentation

## 2015-01-22 DIAGNOSIS — Z8673 Personal history of transient ischemic attack (TIA), and cerebral infarction without residual deficits: Secondary | ICD-10-CM | POA: Insufficient documentation

## 2015-01-22 DIAGNOSIS — I10 Essential (primary) hypertension: Secondary | ICD-10-CM | POA: Insufficient documentation

## 2015-01-22 DIAGNOSIS — I4892 Unspecified atrial flutter: Secondary | ICD-10-CM | POA: Insufficient documentation

## 2015-01-22 DIAGNOSIS — R791 Abnormal coagulation profile: Secondary | ICD-10-CM

## 2015-01-22 DIAGNOSIS — Z7982 Long term (current) use of aspirin: Secondary | ICD-10-CM | POA: Insufficient documentation

## 2015-01-22 DIAGNOSIS — Z7901 Long term (current) use of anticoagulants: Secondary | ICD-10-CM | POA: Insufficient documentation

## 2015-01-22 DIAGNOSIS — R112 Nausea with vomiting, unspecified: Secondary | ICD-10-CM | POA: Insufficient documentation

## 2015-01-22 DIAGNOSIS — E860 Dehydration: Secondary | ICD-10-CM | POA: Insufficient documentation

## 2015-01-22 DIAGNOSIS — R1010 Upper abdominal pain, unspecified: Secondary | ICD-10-CM | POA: Diagnosis not present

## 2015-01-22 DIAGNOSIS — K2970 Gastritis, unspecified, without bleeding: Secondary | ICD-10-CM | POA: Diagnosis not present

## 2015-01-22 DIAGNOSIS — I2510 Atherosclerotic heart disease of native coronary artery without angina pectoris: Secondary | ICD-10-CM | POA: Insufficient documentation

## 2015-01-22 DIAGNOSIS — E785 Hyperlipidemia, unspecified: Secondary | ICD-10-CM | POA: Insufficient documentation

## 2015-01-22 HISTORY — DX: Unspecified atrial fibrillation: I48.91

## 2015-01-22 LAB — COMPREHENSIVE METABOLIC PANEL
ALK PHOS: 140 U/L — AB (ref 38–126)
ALT: 38 U/L (ref 17–63)
ANION GAP: 10 (ref 5–15)
AST: 35 U/L (ref 15–41)
Albumin: 4.4 g/dL (ref 3.5–5.0)
BILIRUBIN TOTAL: 0.5 mg/dL (ref 0.3–1.2)
BUN: 74 mg/dL — AB (ref 6–20)
CALCIUM: 9.4 mg/dL (ref 8.9–10.3)
CO2: 22 mmol/L (ref 22–32)
Chloride: 116 mmol/L — ABNORMAL HIGH (ref 101–111)
Creatinine, Ser: 3.66 mg/dL — ABNORMAL HIGH (ref 0.61–1.24)
GFR calc Af Amer: 18 mL/min — ABNORMAL LOW (ref >60)
GFR calc non Af Amer: 16 mL/min — ABNORMAL LOW (ref >60)
GLUCOSE: 148 mg/dL — AB (ref 65–99)
Potassium: 5 mmol/L (ref 3.5–5.1)
Sodium: 148 mmol/L — ABNORMAL HIGH (ref 135–145)
TOTAL PROTEIN: 9.1 g/dL — AB (ref 6.5–8.1)

## 2015-01-22 LAB — PROTIME-INR
INR: 4.37 — ABNORMAL HIGH (ref 0.00–1.49)
Prothrombin Time: 40.6 seconds — ABNORMAL HIGH (ref 11.6–15.2)

## 2015-01-22 LAB — URINALYSIS, ROUTINE W REFLEX MICROSCOPIC
BILIRUBIN URINE: NEGATIVE
Glucose, UA: NEGATIVE mg/dL
KETONES UR: NEGATIVE mg/dL
Leukocytes, UA: NEGATIVE
NITRITE: NEGATIVE
Protein, ur: 100 mg/dL — AB
Specific Gravity, Urine: 1.019 (ref 1.005–1.030)
pH: 5 (ref 5.0–8.0)

## 2015-01-22 LAB — I-STAT TROPONIN, ED: TROPONIN I, POC: 0.02 ng/mL (ref 0.00–0.08)

## 2015-01-22 LAB — CBC
HCT: 43.5 % (ref 39.0–52.0)
Hemoglobin: 14.2 g/dL (ref 13.0–17.0)
MCH: 28.2 pg (ref 26.0–34.0)
MCHC: 32.6 g/dL (ref 30.0–36.0)
MCV: 86.5 fL (ref 78.0–100.0)
PLATELETS: 247 10*3/uL (ref 150–400)
RBC: 5.03 MIL/uL (ref 4.22–5.81)
RDW: 15.2 % (ref 11.5–15.5)
WBC: 12.3 10*3/uL — ABNORMAL HIGH (ref 4.0–10.5)

## 2015-01-22 LAB — URINE MICROSCOPIC-ADD ON
RBC / HPF: NONE SEEN RBC/hpf (ref 0–5)
Squamous Epithelial / LPF: NONE SEEN

## 2015-01-22 LAB — LIPASE, BLOOD: Lipase: 208 U/L — ABNORMAL HIGH (ref 11–51)

## 2015-01-22 MED ORDER — SODIUM CHLORIDE 0.9 % IV BOLUS (SEPSIS)
500.0000 mL | Freq: Once | INTRAVENOUS | Status: AC
  Administered 2015-01-22: 500 mL via INTRAVENOUS

## 2015-01-22 MED ORDER — ONDANSETRON 4 MG PO TBDP
4.0000 mg | ORAL_TABLET | Freq: Once | ORAL | Status: AC
  Administered 2015-01-22: 4 mg via ORAL
  Filled 2015-01-22: qty 1

## 2015-01-22 MED ORDER — GI COCKTAIL ~~LOC~~
30.0000 mL | Freq: Once | ORAL | Status: AC
  Administered 2015-01-22: 30 mL via ORAL
  Filled 2015-01-22: qty 30

## 2015-01-22 MED ORDER — HYDROCODONE-ACETAMINOPHEN 5-325 MG PO TABS: 1.0000 | ORAL_TABLET | ORAL | Status: DC | PRN

## 2015-01-22 MED ORDER — ONDANSETRON HCL 4 MG PO TABS: 4.0000 mg | ORAL_TABLET | Freq: Four times a day (QID) | ORAL | Status: DC | PRN

## 2015-01-22 MED ORDER — OMEPRAZOLE 20 MG PO CPDR: 20.0000 mg | DELAYED_RELEASE_CAPSULE | Freq: Every day | ORAL | Status: DC

## 2015-01-22 MED ORDER — SUCRALFATE 1 G PO TABS: 1.0000 g | ORAL_TABLET | Freq: Three times a day (TID) | ORAL | Status: DC | PRN

## 2015-01-22 NOTE — ED Notes (Signed)
Pt presents via EMS from home c/o recurrent upper abdominal pain without associated nausea/vomiting.  Pain 8/10, no radiation, located above his navel.  He was seen last week for same.  No signs of acute distress, vitals WNL en route. New dx of A Fib during last visit.

## 2015-01-22 NOTE — Discharge Instructions (Signed)
Do not take your Coumadin until you're evaluated in the family medicine clinic on Tuesday morning at 8:30 AM. In addition to prescribed medications, you may take over-the-counter Mylanta or Maalox for upper abdominal pain. Continue to drink fluids and slowly advance diet as able. Return immediately for persistent vomiting, increased weakness, worsening pain, fever, vomiting blood/black stool or for any concerns.  Gastritis, Adult Gastritis is soreness and swelling (inflammation) of the lining of the stomach. Gastritis can develop as a sudden onset (acute) or long-term (chronic) condition. If gastritis is not treated, it can lead to stomach bleeding and ulcers. CAUSES  Gastritis occurs when the stomach lining is weak or damaged. Digestive juices from the stomach then inflame the weakened stomach lining. The stomach lining may be weak or damaged due to viral or bacterial infections. One common bacterial infection is the Helicobacter pylori infection. Gastritis can also result from excessive alcohol consumption, taking certain medicines, or having too much acid in the stomach.  SYMPTOMS  In some cases, there are no symptoms. When symptoms are present, they may include:  Pain or a burning sensation in the upper abdomen.  Nausea.  Vomiting.  An uncomfortable feeling of fullness after eating. DIAGNOSIS  Your caregiver may suspect you have gastritis based on your symptoms and a physical exam. To determine the cause of your gastritis, your caregiver may perform the following:  Blood or stool tests to check for the H pylori bacterium.  Gastroscopy. A thin, flexible tube (endoscope) is passed down the esophagus and into the stomach. The endoscope has a light and camera on the end. Your caregiver uses the endoscope to view the inside of the stomach.  Taking a tissue sample (biopsy) from the stomach to examine under a microscope. TREATMENT  Depending on the cause of your gastritis, medicines may be  prescribed. If you have a bacterial infection, such as an H pylori infection, antibiotics may be given. If your gastritis is caused by too much acid in the stomach, H2 blockers or antacids may be given. Your caregiver may recommend that you stop taking aspirin, ibuprofen, or other nonsteroidal anti-inflammatory drugs (NSAIDs). HOME CARE INSTRUCTIONS  Only take over-the-counter or prescription medicines as directed by your caregiver.  If you were given antibiotic medicines, take them as directed. Finish them even if you start to feel better.  Drink enough fluids to keep your urine clear or pale yellow.  Avoid foods and drinks that make your symptoms worse, such as:  Caffeine or alcoholic drinks.  Chocolate.  Peppermint or mint flavorings.  Garlic and onions.  Spicy foods.  Citrus fruits, such as oranges, lemons, or limes.  Tomato-based foods such as sauce, chili, salsa, and pizza.  Fried and fatty foods.  Eat small, frequent meals instead of large meals. SEEK IMMEDIATE MEDICAL CARE IF:   You have black or dark red stools.  You vomit blood or material that looks like coffee grounds.  You are unable to keep fluids down.  Your abdominal pain gets worse.  You have a fever.  You do not feel better after 1 week.  You have any other questions or concerns. MAKE SURE YOU:  Understand these instructions.  Will watch your condition.  Will get help right away if you are not doing well or get worse.   This information is not intended to replace advice given to you by your health care provider. Make sure you discuss any questions you have with your health care provider.   Document Released: 01/08/2001  Document Revised: 07/16/2011 Document Reviewed: 02/27/2011 Elsevier Interactive Patient Education 2016 Else[employer] Inc[employer]  Dehydration, Adult Dehydration is a condition in which you do not have enough fluid or water in your body. It happens when you take in less fluid than you  lose. Vital organs such as the kidneys, brain, and heart cannot function without a proper amount of fluids. Any loss of fluids from the body can cause dehydration.  Dehydration can range from mild to severe. This condition should be treated right away to help prevent it from becoming severe. CAUSES  This condition may be caused by:  Vomiting.  Diarrhea.  Excessive sweating, such as when exercising in hot or humid weather.  Not drinking enough fluid during strenuous exercise or during an illness.  Excessive urine output.  Fever.  Certain medicines. RISK FACTORS This condition is more likely to develop in:  People who are taking certain medicines that cause the body to lose excess fluid (diuretics).   People who have a chronic illness, such as diabetes, that may increase urination.  Older adults.   People who live at high altitudes.   People who participate in endurance sports.  SYMPTOMS  Mild Dehydration  Thirst.  Dry lips.  Slightly dry mouth.  Dry, warm skin. Moderate Dehydration  Very dry mouth.   Muscle cramps.   Dark urine and decreased urine production.   Decreased tear production.   Headache.   Light-headedness, especially when you stand up from a sitting position.  Severe Dehydration  Changes in skin.   Cold and clammy skin.   Skin does not spring back quickly when lightly pinched and released.   Changes in body fluids.   Extreme thirst.   No tears.   Not able to sweat when body temperature is high, such as in hot weather.   Minimal urine production.   Changes in vital signs.   Rapid, weak pulse (more than 100 beats per minute when you are sitting still).   Rapid breathing.   Low blood pressure.   Other changes.   Sunken eyes.   Cold hands and feet.   Confusion.  Lethargy and difficulty being awakened.  Fainting (syncope).   Short-term weight loss.   Unconsciousness. DIAGNOSIS  This  condition may be diagnosed based on your symptoms. You may also have tests to determine how severe your dehydration is. These tests may include:   Urine tests.   Blood tests.  TREATMENT  Treatment for this condition depends on the severity. Mild or moderate dehydration can often be treated at home. Treatment should be started right away. Do not wait until dehydration becomes severe. Severe dehydration needs to be treated at the hospital. Treatment for Mild Dehydration  Drinking plenty of water to replace the fluid you have lost.   Replacing minerals in your blood (electrolytes) that you may have lost.  Treatment for Moderate Dehydration  Consuming oral rehydration solution (ORS). Treatment for Severe Dehydration  Receiving fluid through an IV tube.   Receiving electrolyte solution through a feeding tube that is passed through your nose and into your stomach (nasogastric tube or NG tube).  Correcting any abnormalities in electrolytes. HOME CARE INSTRUCTIONS   Drink enough fluid to keep your urine clear or pale yellow.   Drink water or fluid slowly by taking small sips. You can also try sucking on ice cubes.  Have food or beverages that contain electrolytes. Examples include bananas and sports drinks.  Take over-the-counter and prescription medicines only as told by  your health care provider.   Prepare ORS according to the manufacturer's instructions. Take sips of ORS every 5 minutes until your urine returns to normal.  If you have vomiting or diarrhea, continue to try to drink water, ORS, or both.   If you have diarrhea, avoid:   Beverages that contain caffeine.   Fruit juice.   Milk.   Carbonated soft drinks.  Do not take salt tablets. This can lead to the condition of having too much sodium in your body (hypernatremia).  SEEK MEDICAL CARE IF:  You cannot eat or drink without vomiting.  You have had moderate diarrhea during a period of more than 24  hours.  You have a fever. SEEK IMMEDIATE MEDICAL CARE IF:   You have extreme thirst.  You have severe diarrhea.  You have not urinated in 6-8 hours, or you have urinated only a small amount of very dark urine.  You have shriveled skin.  You are dizzy, confused, or both.   This information is not intended to replace advice given to you by your health care provider. Make sure you discuss any questions you have with your health care provider.   Document Released: 01/14/2005 Document Revised: 10/05/2014 Document Reviewed: 06/01/2014 Elsevier Interactive Patient Education 2016 Elsevier Inc.  Nausea and Vomiting Nausea is a sick feeling that often comes before throwing up (vomiting). Vomiting is a reflex where stomach contents come out of your mouth. Vomiting can cause severe loss of body fluids (dehydration). Children and elderly adults can become dehydrated quickly, especially if they also have diarrhea. Nausea and vomiting are symptoms of a condition or disease. It is important to find the cause of your symptoms. CAUSES   Direct irritation of the stomach lining. This irritation can result from increased acid production (gastroesophageal reflux disease), infection, food poisoning, taking certain medicines (such as nonsteroidal anti-inflammatory drugs), alcohol use, or tobacco use.  Signals from the brain.These signals could be caused by a headache, heat exposure, an inner ear disturbance, increased pressure in the brain from injury, infection, a tumor, or a concussion, pain, emotional stimulus, or metabolic problems.  An obstruction in the gastrointestinal tract (bowel obstruction).  Illnesses such as diabetes, hepatitis, gallbladder problems, appendicitis, kidney problems, cancer, sepsis, atypical symptoms of a heart attack, or eating disorders.  Medical treatments such as chemotherapy and radiation.  Receiving medicine that makes you sleep (general anesthetic) during  surgery. DIAGNOSIS Your caregiver may ask for tests to be done if the problems do not improve after a few days. Tests may also be done if symptoms are severe or if the reason for the nausea and vomiting is not clear. Tests may include:  Urine tests.  Blood tests.  Stool tests.  Cultures (to look for evidence of infection).  X-rays or other imaging studies. Test results can help your caregiver make decisions about treatment or the need for additional tests. TREATMENT You need to stay well hydrated. Drink frequently but in small amounts.You may wish to drink water, sports drinks, clear broth, or eat frozen ice pops or gelatin dessert to help stay hydrated.When you eat, eating slowly may help prevent nausea.There are also some antinausea medicines that may help prevent nausea. HOME CARE INSTRUCTIONS   Take all medicine as directed by your caregiver.  If you do not have an appetite, do not force yourself to eat. However, you must continue to drink fluids.  If you have an appetite, eat a normal diet unless your caregiver tells you differently.  Eat  a variety of complex carbohydrates (rice, wheat, potatoes, bread), lean meats, yogurt, fruits, and vegetables.  Avoid high-fat foods because they are more difficult to digest.  Drink enough water and fluids to keep your urine clear or pale yellow.  If you are dehydrated, ask your caregiver for specific rehydration instructions. Signs of dehydration may include:  Severe thirst.  Dry lips and mouth.  Dizziness.  Dark urine.  Decreasing urine frequency and amount.  Confusion.  Rapid breathing or pulse. SEEK IMMEDIATE MEDICAL CARE IF:   You have blood or brown flecks (like coffee grounds) in your vomit.  You have black or bloody stools.  You have a severe headache or stiff neck.  You are confused.  You have severe abdominal pain.  You have chest pain or trouble breathing.  You do not urinate at least once every 8  hours.  You develop cold or clammy skin.  You continue to vomit for longer than 24 to 48 hours.  You have a fever. MAKE SURE YOU:   Understand these instructions.  Will watch your condition.  Will get help right away if you are not doing well or get worse.   This information is not intended to replace advice given to you by your health care provider. Make sure you discuss any questions you have with your health care provider.   Document Released: 01/14/2005 Document Revised: 04/08/2011 Document Reviewed: 06/13/2010 Elsevier Interactive Patient Education [employer]

## 2015-01-22 NOTE — ED Notes (Signed)
Bed: WA03 Expected date:  Expected time:  Means of arrival:  Comments: EMS- 69 yo abd pain

## 2015-01-22 NOTE — ED Provider Notes (Signed)
CSN: [id number]     Arrival date & time 01/22/15  1547 History   First MD Initiated Contact with Patient 01/22/15 1549     Chief Complaint  Patient presents with  . Abdominal Pain     (Consider location/radiation/quality/duration/timing/severity/associated sxs/prior Treatment) HPI Patient recently admitted for nausea, vomiting and onset renal failure. Also found to have a fibrillation and started on Coumadin. Patient was diagnosed with, kidney urinary tract infection and is currently taking doxycycline. Was discharged 2 days ago. States he's been having epigastric abdominal pain since Friday. He states it radiates to the back. It is associated with nausea and vomiting when eating or drinking. Denies any fevers or chills. No difficulty breathing or chest pain. No melena or gross blood. Past Medical History  Diagnosis Date  . Hypertension   . HLD (hyperlipidemia)   . Erectile dysfunction   . Chronic back pain   . CAD (coronary artery disease)   . GERD (gastroesophageal reflux disease)   . Arthritis     knees,lower back  . Carotid artery disease (HCC)   . PVD (peripheral vascular disease) with claudication (HCC)   . AAA (abdominal aortic aneurysm) without rupture (HCC) 06/2014    no change in last exam from 2015  . History of CVA (cerebrovascular accident)     2009  &  2013  . Type 2 diabetes mellitus (HCC)     no meds per pt  . Atrial flutter with rapid ventricular response (HCC) 01/16/2015  . Atrial fibrillation [facility])    Past Surgical History  Procedure Laterality Date  . Esophagogastroduodenoscopy N/A 05/15/2014    Procedure: ESOPHAGOGASTRODUODENOSCOPY (EGD);  Surgeon: Carman Ching, MD;  Location: [facility] ENDOSCOPY;  Service: Endoscopy;  Laterality: N/A;  . Cataract extraction w/ intraocular lens  implant, bilateral  april and may 2014  . Transthoracic echocardiogram  01-07-2012    mild to moderate dilated LV,  ef 45-50%,  mild basal hypokinesis,  grade I diastolic  dysfunction/   trivial AR/  mild MR  . Cardiovascular stress test  08-09-2009    mild global hypokinesis,  no ischemia or scar/  ef 41%  . Cystoscopy with urethral dilatation N/A 11/24/2014    Procedure: CYSTOSCOPY WITH URETHRAL DILATATION;  Surgeon: Crist Fat, MD;  Location: [facility];  Service: Urology;  Laterality: N/A;  BALLOON DILATION        . Cystoscopy with retrograde urethrogram N/A 11/24/2014    Procedure: CYSTOSCOPY WITH RETROGRADE URETHROGRAM;  Surgeon: Crist Fat, MD;  Location: [facility];  Service: Urology;  Laterality: N/A;   Family History  Problem Relation Age of Onset  . Lung disease Brother   . Kidney disease Brother   . Heart disease Brother     died at 45  . Heart disease Mother   . Kidney disease Mother   . Lung disease Mother    Social History  Substance Use Topics  . Smoking status: Former Smoker -- 0.50 packs/day for 40 years    Types: Cigarettes    Quit date: 01/29/2004  . Smokeless tobacco: Never Used  . Alcohol Use: No    Review of Systems  Constitutional: Negative for fever and chills.  Respiratory: Negative for shortness of breath.   Cardiovascular: Negative for chest pain.  Gastrointestinal: Positive for nausea, vomiting and abdominal pain. Negative for diarrhea, constipation, blood in stool and abdominal distention.  Musculoskeletal: Positive for back pain. Negative for myalgias, neck pain and neck stiffness.  Skin: Negative  for rash and wound.  Neurological: Negative for dizziness, weakness, light-headedness, numbness and headaches.  All other systems reviewed and are negative.     Allergies  Novocain  Home Medications   Prior to Admission medications   Medication Sig Start Date End Date Taking? Authorizing Provider  amLODipine (NORVASC) 10 MG tablet Take 1 tablet (10 mg total) by mouth daily. 02/24/14  Yes Stephanie Coup Street, MD  aspirin EC 81 MG tablet Take 81 mg by mouth daily.   Yes  Historical Provider, MD  atorvastatin (LIPITOR) 40 MG tablet Take 1 tablet (40 mg total) by mouth daily. 09/27/14  Yes Erasmo Downer, MD  calcium carbonate (TUMS - DOSED IN MG ELEMENTAL CALCIUM) 500 MG chewable tablet Chew 1 tablet by mouth 2 (two) times daily as needed for indigestion or heartburn.   Yes Historical Provider, MD  doxycycline (VIBRA-TABS) 100 MG tablet Take 1 tablet (100 mg total) by mouth every 12 (twelve) hours. 01/20/15  Yes Joanna Puff, MD  meclizine (ANTIVERT) 25 MG tablet Take 1 tablet (25 mg total) by mouth 3 (three) times daily as needed for dizziness. 10/14/14  Yes Federico Flake, MD  traMADol (ULTRAM) 50 MG tablet Take 1-2 tablets (50-100 mg total) by mouth every 8 (eight) hours as needed for moderate pain. 11/24/14  Yes Crist Fat, MD  warfarin (COUMADIN) 2.5 MG tablet Take 1 tablet (2.5 mg total) by mouth daily. Starting 12/24, then as directed from INR check on 12/27 01/20/15  Yes Crystal Derek [place]ound, MD  finasteride (PROSCAR) 5 MG tablet Take 1 tablet (5 mg total) by mouth daily. Patient not taking: Reported on 01/22/2015 04/26/13   Stephanie Coup Street, MD  gabapentin (NEURONTIN) 100 MG capsule Take 1 capsule (100 mg total) by mouth 3 (three) times daily. Patient not taking: Reported on 01/14/2015 07/23/14   Stephanie Coup Street, MD  HYDROcodone-acetaminophen [facility]) 5-325 MG tablet Take 1-2 tablets by mouth every 4 (four) hours as needed for severe pain. 01/22/15   Loren Racer, MD  metFORMIN (GLUCOPHAGE) 500 MG tablet Take 1 tablet (500 mg total) by mouth daily. Patient not taking: Reported on 01/14/2015 09/14/13   Myra Rude, MD  omeprazole (PRILOSEC) 20 MG capsule Take 1 capsule (20 mg total) by mouth daily. 01/22/15   Loren Racer, MD  ondansetron (ZOFRAN) 4 MG tablet Take 1 tablet (4 mg total) by mouth every 6 (six) hours as needed for nausea or vomiting. 01/22/15   Loren Racer, MD  phenazopyridine (PYRIDIUM) 200 MG tablet Take 1  tablet (200 mg total) by mouth 3 (three) times daily as needed for pain. Patient not taking: Reported on 01/14/2015 11/24/14   Crist Fat, MD  sucralfate (CARAFATE) 1 G tablet Take 1 tablet (1 g total) by mouth 3 (three) times daily as needed. 01/22/15   Loren Racer, MD  tamsulosin (FLOMAX) 0.4 MG CAPS capsule Take 1 capsule (0.4 mg total) by mouth daily. Patient not taking: Reported on 01/14/2015 04/07/14   Stephanie Coup Street, MD   BP 153/115 mmHg  Pulse 91  Temp(Src) 97.8 F (36.6 C) (Oral)  Resp 17  Ht 6\' 4"  (1.93 m)  Wt 190 lb (86.183 kg)  BMI 23.14 kg/m2  SpO2 98% Physical Exam  Constitutional: He is oriented to person, place, and time. He appears well-developed and well-nourished. No distress.  HENT:  Head: Normocephalic and atraumatic.  Mouth/Throat: Oropharynx is clear and moist. No oropharyngeal exudate.  Eyes: EOM are normal. Pupils are equal, round,  and reactive to light.  Neck: Normal range of motion. Neck supple. No JVD present.  Cardiovascular: Normal rate and regular rhythm.  Exam reveals no gallop and no friction rub.   No murmur heard. Pulmonary/Chest: Effort normal and breath sounds normal. No respiratory distress. He has no wheezes. He has no rales. He exhibits no tenderness.  Abdominal: Soft. Bowel sounds are normal. He exhibits no distension and no mass. There is tenderness (tender to palpation in the epigastric region. There is no rebound or guarding. No pulsatile masses). There is no rebound and no guarding.  Musculoskeletal: Normal range of motion. He exhibits no edema or tenderness.  No CVA tenderness bilaterally. No lower extremity swelling or pain.  Neurological: He is alert and oriented to person, place, and time.  Moves all extremities without deficit. Sensation is intact.  Skin: Skin is warm and dry. No rash noted. No erythema.  Psychiatric: He has a normal mood and affect. His behavior is normal.  Nursing note and vitals reviewed.   ED  Course  Procedures (including critical care time) Labs Review Labs Reviewed  LIPASE, BLOOD - Abnormal; Notable for the following:    Lipase 208 (*)    All other components within normal limits  COMPREHENSIVE METABOLIC PANEL - Abnormal; Notable for the following:    Sodium 148 (*)    Chloride 116 (*)    Glucose, Bld 148 (*)    BUN 74 (*)    Creatinine, Ser 3.66 (*)    Total Protein 9.1 (*)    Alkaline Phosphatase 140 (*)    GFR calc non Af Amer 16 (*)    GFR calc Af Amer 18 (*)    All other components within normal limits  CBC - Abnormal; Notable for the following:    WBC 12.3 (*)    All other components within normal limits  URINALYSIS, ROUTINE W REFLEX MICROSCOPIC (NOT AT [facility]) - Abnormal; Notable for the following:    APPearance HAZY (*)    Hgb urine dipstick SMALL (*)    Protein, ur 100 (*)    All other components within normal limits  PROTIME-INR - Abnormal; Notable for the following:    Prothrombin Time 40.6 (*)    INR 4.37 (*)    All other components within normal limits  URINE MICROSCOPIC-ADD ON - Abnormal; Notable for the following:    Bacteria, UA RARE (*)    Casts GRANULAR CAST (*)    Crystals URIC ACID CRYSTALS (*)    All other components within normal limits  I-STAT TROPOININ, ED    Imaging Review Dg Abd Acute W/chest  01/22/2015  CLINICAL DATA:  Recurrent upper abdominal pain above the umbilicus. Similar symptoms last week. History of abdominal aortic aneurysm, hypertension and peripheral vascular disease. EXAM: DG ABDOMEN ACUTE W/ 1V CHEST COMPARISON:  Acute abdominal series done 01/14/2015. FINDINGS: The heart size and mediastinal contours are stable. The lungs are clear. There is no pleural effusion or pneumothorax. Bone island within the left scapula appears unchanged. The bowel gas pattern is normal. There is no free intraperitoneal air. Vascular calcifications in the pelvis appear unchanged. IMPRESSION: Stable chest and abdominal radiographs. No active  cardiopulmonary or abdominal process demonstrated. Electronically Signed   By: Carey Bullocks M.D.   On: 01/22/2015 16:43   I have personally reviewed and evaluated these images and lab results as part of my medical decision-making.   EKG Interpretation   Date/Time:  Sunday January 22 2015 16:47:22 EST Ventricular Rate:  88 PR Interval:  135 QRS Duration: 84 QT Interval:  366 QTC Calculation: 443 R Axis:   68 Text Interpretation:  Sinus rhythm Borderline T abnormalities, lateral  leads Minimal ST elevation, inferior leads Confirmed by Arianna Haydon  MD,  Voncille Simm ([id number]) on 01/22/2015 5:15:29 PM      MDM   Final diagnoses:  Dehydration  Epigastric pain  Non-intractable vomiting with nausea, vomiting of unspecified type  Elevated INR   Family bedside. Report decreased oral intake and increased generalized weakness. Patient states his pain is improved after GI cocktail. Abdomen is soft and nontender. Will give IV fluids and attempt oral trial.   Patient states he's felt much better after IV fluids. He is drinking without any vomiting or retching. Continues to have no abdominal pain. Patient has elevated INR and mild worsening of his renal function. This likely is due to dehydration. Offered admission to patient and his family. They stated they would like to be discharged home and follow-up with his primary physician. They understand the need to return immediately for any worsening vomiting, fever, persistent pain or concerns. Discussed with family medicine resident on-call. We will set appointment for 8:30 AM on Tuesday morning. This been relayed to the patient and he understands need follow-up.  Loren Racer, MD 01/22/15 [phone]

## 2015-01-24 ENCOUNTER — Inpatient Hospital Stay (HOSPITAL_COMMUNITY)
Admission: EM | Admit: 2015-01-24 | Discharge: 2015-02-11 | DRG: 392 | Disposition: A | Discharge status: Skilled Nursing Facility | Payer: Commercial Managed Care - HMO | Attending: Family Medicine | Admitting: Family Medicine

## 2015-01-24 ENCOUNTER — Inpatient Hospital Stay: Payer: Medicare Other | Admitting: Student

## 2015-01-24 ENCOUNTER — Encounter (HOSPITAL_COMMUNITY): Payer: Self-pay

## 2015-01-24 ENCOUNTER — Emergency Department (HOSPITAL_COMMUNITY): Payer: Commercial Managed Care - HMO

## 2015-01-24 ENCOUNTER — Ambulatory Visit: Payer: Commercial Managed Care - HMO | Admitting: Family Medicine

## 2015-01-24 DIAGNOSIS — E440 Moderate protein-calorie malnutrition: Secondary | ICD-10-CM | POA: Diagnosis not present

## 2015-01-24 DIAGNOSIS — R51 Headache: Secondary | ICD-10-CM | POA: Diagnosis not present

## 2015-01-24 DIAGNOSIS — N179 Acute kidney failure, unspecified: Secondary | ICD-10-CM | POA: Diagnosis not present

## 2015-01-24 DIAGNOSIS — Z7982 Long term (current) use of aspirin: Secondary | ICD-10-CM

## 2015-01-24 DIAGNOSIS — R634 Abnormal weight loss: Secondary | ICD-10-CM | POA: Insufficient documentation

## 2015-01-24 DIAGNOSIS — R29818 Other symptoms and signs involving the nervous system: Secondary | ICD-10-CM | POA: Diagnosis not present

## 2015-01-24 DIAGNOSIS — K861 Other chronic pancreatitis: Secondary | ICD-10-CM | POA: Diagnosis present

## 2015-01-24 DIAGNOSIS — I2510 Atherosclerotic heart disease of native coronary artery without angina pectoris: Secondary | ICD-10-CM | POA: Diagnosis not present

## 2015-01-24 DIAGNOSIS — E43 Unspecified severe protein-calorie malnutrition: Secondary | ICD-10-CM

## 2015-01-24 DIAGNOSIS — E1122 Type 2 diabetes mellitus with diabetic chronic kidney disease: Secondary | ICD-10-CM | POA: Diagnosis present

## 2015-01-24 DIAGNOSIS — K8500 Idiopathic acute pancreatitis without necrosis or infection: Secondary | ICD-10-CM | POA: Diagnosis not present

## 2015-01-24 DIAGNOSIS — I129 Hypertensive chronic kidney disease with stage 1 through stage 4 chronic kidney disease, or unspecified chronic kidney disease: Secondary | ICD-10-CM | POA: Diagnosis present

## 2015-01-24 DIAGNOSIS — R918 Other nonspecific abnormal finding of lung field: Secondary | ICD-10-CM | POA: Diagnosis not present

## 2015-01-24 DIAGNOSIS — R633 Feeding difficulties: Secondary | ICD-10-CM | POA: Diagnosis not present

## 2015-01-24 DIAGNOSIS — Y732 Prosthetic and other implants, materials and accessory gastroenterology and urology devices associated with adverse incidents: Secondary | ICD-10-CM | POA: Diagnosis not present

## 2015-01-24 DIAGNOSIS — R1310 Dysphagia, unspecified: Secondary | ICD-10-CM | POA: Diagnosis not present

## 2015-01-24 DIAGNOSIS — R911 Solitary pulmonary nodule: Secondary | ICD-10-CM | POA: Diagnosis present

## 2015-01-24 DIAGNOSIS — K219 Gastro-esophageal reflux disease without esophagitis: Secondary | ICD-10-CM | POA: Diagnosis not present

## 2015-01-24 DIAGNOSIS — I10 Essential (primary) hypertension: Secondary | ICD-10-CM | POA: Diagnosis not present

## 2015-01-24 DIAGNOSIS — N390 Urinary tract infection, site not specified: Secondary | ICD-10-CM | POA: Diagnosis present

## 2015-01-24 DIAGNOSIS — Z4682 Encounter for fitting and adjustment of non-vascular catheter: Secondary | ICD-10-CM | POA: Diagnosis not present

## 2015-01-24 DIAGNOSIS — I480 Paroxysmal atrial fibrillation: Secondary | ICD-10-CM | POA: Diagnosis present

## 2015-01-24 DIAGNOSIS — M6281 Muscle weakness (generalized): Secondary | ICD-10-CM | POA: Diagnosis not present

## 2015-01-24 DIAGNOSIS — I639 Cerebral infarction, unspecified: Secondary | ICD-10-CM | POA: Diagnosis not present

## 2015-01-24 DIAGNOSIS — K2970 Gastritis, unspecified, without bleeding: Secondary | ICD-10-CM | POA: Diagnosis not present

## 2015-01-24 DIAGNOSIS — Z87438 Personal history of other diseases of male genital organs: Secondary | ICD-10-CM | POA: Diagnosis not present

## 2015-01-24 DIAGNOSIS — Z7901 Long term (current) use of anticoagulants: Secondary | ICD-10-CM

## 2015-01-24 DIAGNOSIS — K228 Other specified diseases of esophagus: Secondary | ICD-10-CM | POA: Diagnosis not present

## 2015-01-24 DIAGNOSIS — N400 Enlarged prostate without lower urinary tract symptoms: Secondary | ICD-10-CM | POA: Diagnosis present

## 2015-01-24 DIAGNOSIS — E875 Hyperkalemia: Secondary | ICD-10-CM | POA: Diagnosis present

## 2015-01-24 DIAGNOSIS — Z5181 Encounter for therapeutic drug level monitoring: Secondary | ICD-10-CM | POA: Diagnosis not present

## 2015-01-24 DIAGNOSIS — R1013 Epigastric pain: Secondary | ICD-10-CM | POA: Diagnosis not present

## 2015-01-24 DIAGNOSIS — T85598A Other mechanical complication of other gastrointestinal prosthetic devices, implants and grafts, initial encounter: Secondary | ICD-10-CM | POA: Diagnosis not present

## 2015-01-24 DIAGNOSIS — G8929 Other chronic pain: Secondary | ICD-10-CM | POA: Diagnosis not present

## 2015-01-24 DIAGNOSIS — K220 Achalasia of cardia: Secondary | ICD-10-CM | POA: Diagnosis not present

## 2015-01-24 DIAGNOSIS — D619 Aplastic anemia, unspecified: Secondary | ICD-10-CM | POA: Insufficient documentation

## 2015-01-24 DIAGNOSIS — N183 Chronic kidney disease, stage 3 (moderate): Secondary | ICD-10-CM | POA: Diagnosis present

## 2015-01-24 DIAGNOSIS — K5900 Constipation, unspecified: Secondary | ICD-10-CM | POA: Diagnosis present

## 2015-01-24 DIAGNOSIS — E871 Hypo-osmolality and hyponatremia: Secondary | ICD-10-CM | POA: Diagnosis not present

## 2015-01-24 DIAGNOSIS — I714 Abdominal aortic aneurysm, without rupture: Secondary | ICD-10-CM | POA: Diagnosis present

## 2015-01-24 DIAGNOSIS — N320 Bladder-neck obstruction: Secondary | ICD-10-CM

## 2015-01-24 DIAGNOSIS — K9423 Gastrostomy malfunction: Secondary | ICD-10-CM | POA: Diagnosis not present

## 2015-01-24 DIAGNOSIS — R109 Unspecified abdominal pain: Principal | ICD-10-CM | POA: Diagnosis present

## 2015-01-24 DIAGNOSIS — R2689 Other abnormalities of gait and mobility: Secondary | ICD-10-CM | POA: Diagnosis not present

## 2015-01-24 DIAGNOSIS — Z79899 Other long term (current) drug therapy: Secondary | ICD-10-CM

## 2015-01-24 DIAGNOSIS — R110 Nausea: Secondary | ICD-10-CM | POA: Diagnosis not present

## 2015-01-24 DIAGNOSIS — T849XXA Unspecified complication of internal orthopedic prosthetic device, implant and graft, initial encounter: Secondary | ICD-10-CM | POA: Diagnosis not present

## 2015-01-24 DIAGNOSIS — D61818 Other pancytopenia: Secondary | ICD-10-CM | POA: Diagnosis not present

## 2015-01-24 DIAGNOSIS — Z431 Encounter for attention to gastrostomy: Secondary | ICD-10-CM | POA: Diagnosis not present

## 2015-01-24 DIAGNOSIS — E870 Hyperosmolality and hypernatremia: Secondary | ICD-10-CM | POA: Diagnosis present

## 2015-01-24 DIAGNOSIS — Z978 Presence of other specified devices: Secondary | ICD-10-CM | POA: Diagnosis not present

## 2015-01-24 DIAGNOSIS — E860 Dehydration: Secondary | ICD-10-CM | POA: Insufficient documentation

## 2015-01-24 DIAGNOSIS — E785 Hyperlipidemia, unspecified: Secondary | ICD-10-CM | POA: Diagnosis present

## 2015-01-24 DIAGNOSIS — Z8673 Personal history of transient ischemic attack (TIA), and cerebral infarction without residual deficits: Secondary | ICD-10-CM

## 2015-01-24 DIAGNOSIS — R627 Adult failure to thrive: Secondary | ICD-10-CM | POA: Diagnosis not present

## 2015-01-24 DIAGNOSIS — N401 Enlarged prostate with lower urinary tract symptoms: Secondary | ICD-10-CM | POA: Diagnosis not present

## 2015-01-24 DIAGNOSIS — R531 Weakness: Secondary | ICD-10-CM

## 2015-01-24 DIAGNOSIS — Z87891 Personal history of nicotine dependence: Secondary | ICD-10-CM

## 2015-01-24 DIAGNOSIS — T859XXA Unspecified complication of internal prosthetic device, implant and graft, initial encounter: Secondary | ICD-10-CM | POA: Diagnosis not present

## 2015-01-24 DIAGNOSIS — Z7984 Long term (current) use of oral hypoglycemic drugs: Secondary | ICD-10-CM | POA: Diagnosis not present

## 2015-01-24 DIAGNOSIS — E878 Other disorders of electrolyte and fluid balance, not elsewhere classified: Secondary | ICD-10-CM | POA: Diagnosis not present

## 2015-01-24 DIAGNOSIS — Z66 Do not resuscitate: Secondary | ICD-10-CM | POA: Diagnosis present

## 2015-01-24 DIAGNOSIS — R1314 Dysphagia, pharyngoesophageal phase: Secondary | ICD-10-CM | POA: Diagnosis not present

## 2015-01-24 DIAGNOSIS — N189 Chronic kidney disease, unspecified: Secondary | ICD-10-CM | POA: Diagnosis not present

## 2015-01-24 DIAGNOSIS — R112 Nausea with vomiting, unspecified: Secondary | ICD-10-CM | POA: Diagnosis not present

## 2015-01-24 DIAGNOSIS — R933 Abnormal findings on diagnostic imaging of other parts of digestive tract: Secondary | ICD-10-CM | POA: Diagnosis not present

## 2015-01-24 DIAGNOSIS — R1319 Other dysphagia: Secondary | ICD-10-CM | POA: Insufficient documentation

## 2015-01-24 DIAGNOSIS — E119 Type 2 diabetes mellitus without complications: Secondary | ICD-10-CM | POA: Diagnosis not present

## 2015-01-24 DIAGNOSIS — M1990 Unspecified osteoarthritis, unspecified site: Secondary | ICD-10-CM | POA: Diagnosis not present

## 2015-01-24 DIAGNOSIS — R278 Other lack of coordination: Secondary | ICD-10-CM | POA: Diagnosis not present

## 2015-01-24 DIAGNOSIS — I739 Peripheral vascular disease, unspecified: Secondary | ICD-10-CM | POA: Diagnosis not present

## 2015-01-24 HISTORY — DX: Idiopathic acute pancreatitis without necrosis or infection: K85.00

## 2015-01-24 LAB — CBC WITH DIFFERENTIAL/PLATELET
Basophils Absolute: 0 10*3/uL (ref 0.0–0.1)
Basophils Relative: 0 %
EOS PCT: 0 %
Eosinophils Absolute: 0 10*3/uL (ref 0.0–0.7)
HCT: 45.2 % (ref 39.0–52.0)
Hemoglobin: 14.6 g/dL (ref 13.0–17.0)
Lymphocytes Relative: 11 %
Lymphs Abs: 1.3 10*3/uL (ref 0.7–4.0)
MCH: 28.1 pg (ref 26.0–34.0)
MCHC: 32.3 g/dL (ref 30.0–36.0)
MCV: 86.9 fL (ref 78.0–100.0)
MONO ABS: 0.6 10*3/uL (ref 0.1–1.0)
MONOS PCT: 5 %
NEUTROS ABS: 10.3 10*3/uL — AB (ref 1.7–7.7)
NEUTROS PCT: 84 %
PLATELETS: 183 10*3/uL (ref 150–400)
RBC: 5.2 MIL/uL (ref 4.22–5.81)
RDW: 15.7 % — AB (ref 11.5–15.5)
WBC: 12.3 10*3/uL — AB (ref 4.0–10.5)

## 2015-01-24 LAB — BASIC METABOLIC PANEL
Anion gap: 13 (ref 5–15)
Anion gap: 12 (ref 5–15)
BUN: 88 mg/dL — AB (ref 6–20)
BUN: 85 mg/dL — ABNORMAL HIGH (ref 6–20)
CHLORIDE: 123 mmol/L — AB (ref 101–111)
CO2: 20 mmol/L — ABNORMAL LOW (ref 22–32)
CO2: 19 mmol/L — ABNORMAL LOW (ref 22–32)
CREATININE: 4.12 mg/dL — AB (ref 0.61–1.24)
Calcium: 9.5 mg/dL (ref 8.9–10.3)
Calcium: 9.1 mg/dL (ref 8.9–10.3)
Chloride: 122 mmol/L — ABNORMAL HIGH (ref 101–111)
Creatinine, Ser: 3.78 mg/dL — ABNORMAL HIGH (ref 0.61–1.24)
GFR calc Af Amer: 16 mL/min — ABNORMAL LOW (ref >60)
GFR calc non Af Amer: 15 mL/min — ABNORMAL LOW (ref >60)
GFR, EST AFRICAN AMERICAN: 18 mL/min — AB (ref >60)
GFR, EST NON AFRICAN AMERICAN: 14 mL/min — AB (ref >60)
GLUCOSE: 150 mg/dL — AB (ref 65–99)
Glucose, Bld: 128 mg/dL — ABNORMAL HIGH (ref 65–99)
POTASSIUM: 5.5 mmol/L — AB (ref 3.5–5.1)
POTASSIUM: 5.3 mmol/L — AB (ref 3.5–5.1)
SODIUM: 154 mmol/L — AB (ref 135–145)
Sodium: 155 mmol/L — ABNORMAL HIGH (ref 135–145)

## 2015-01-24 LAB — URINE MICROSCOPIC-ADD ON

## 2015-01-24 LAB — I-STAT CHEM 8, ED
BUN: 80 mg/dL — AB (ref 6–20)
CALCIUM ION: 1.2 mmol/L (ref 1.13–1.30)
CHLORIDE: 125 mmol/L — AB (ref 101–111)
Creatinine, Ser: 3.3 mg/dL — ABNORMAL HIGH (ref 0.61–1.24)
Glucose, Bld: 144 mg/dL — ABNORMAL HIGH (ref 65–99)
HEMATOCRIT: 47 % (ref 39.0–52.0)
Hemoglobin: 16 g/dL (ref 13.0–17.0)
Potassium: 5.3 mmol/L — ABNORMAL HIGH (ref 3.5–5.1)
SODIUM: 156 mmol/L — AB (ref 135–145)
TCO2: 21 mmol/L (ref 0–100)

## 2015-01-24 LAB — URINALYSIS, ROUTINE W REFLEX MICROSCOPIC
Bilirubin Urine: NEGATIVE
GLUCOSE, UA: NEGATIVE mg/dL
KETONES UR: NEGATIVE mg/dL
Nitrite: NEGATIVE
PH: 5 (ref 5.0–8.0)
Protein, ur: 100 mg/dL — AB
Specific Gravity, Urine: 1.019 (ref 1.005–1.030)

## 2015-01-24 LAB — LACTIC ACID, PLASMA
Lactic Acid, Venous: 1.7 mmol/L (ref 0.5–2.0)
Lactic Acid, Venous: 1.9 mmol/L (ref 0.5–2.0)

## 2015-01-24 LAB — PROTIME-INR
INR: 5.94 — AB (ref 0.00–1.49)
PROTHROMBIN TIME: 51.2 s — AB (ref 11.6–15.2)

## 2015-01-24 LAB — LIPASE, BLOOD: Lipase: 338 U/L — ABNORMAL HIGH (ref 11–51)

## 2015-01-24 MED ORDER — AMLODIPINE BESYLATE 10 MG PO TABS
10.0000 mg | ORAL_TABLET | Freq: Every day | ORAL | Status: DC
  Administered 2015-01-24 – 2015-02-11 (×16): 10 mg via ORAL
  Filled 2015-01-24 (×18): qty 1

## 2015-01-24 MED ORDER — HYDROCODONE-ACETAMINOPHEN 5-325 MG PO TABS
1.0000 | ORAL_TABLET | ORAL | Status: DC | PRN
  Administered 2015-02-01: 1 via ORAL
  Filled 2015-01-24: qty 1

## 2015-01-24 MED ORDER — CALCIUM CARBONATE ANTACID 500 MG PO CHEW
1.0000 | CHEWABLE_TABLET | Freq: Two times a day (BID) | ORAL | Status: DC | PRN
  Administered 2015-02-09: 200 mg via ORAL
  Filled 2015-01-24 (×2): qty 1

## 2015-01-24 MED ORDER — SODIUM CHLORIDE 0.9 % IJ SOLN
3.0000 mL | Freq: Two times a day (BID) | INTRAMUSCULAR | Status: DC
  Administered 2015-01-24 – 2015-02-10 (×21): 3 mL via INTRAVENOUS

## 2015-01-24 MED ORDER — ATORVASTATIN CALCIUM 40 MG PO TABS
40.0000 mg | ORAL_TABLET | Freq: Every day | ORAL | Status: DC
  Administered 2015-01-24 – 2015-02-10 (×15): 40 mg via ORAL
  Filled 2015-01-24 (×15): qty 1

## 2015-01-24 MED ORDER — ASPIRIN EC 81 MG PO TBEC
81.0000 mg | DELAYED_RELEASE_TABLET | Freq: Every day | ORAL | Status: DC
  Administered 2015-01-24 – 2015-02-11 (×15): 81 mg via ORAL
  Filled 2015-01-24 (×16): qty 1

## 2015-01-24 MED ORDER — LABETALOL HCL 5 MG/ML IV SOLN: 5.0000 mg | Freq: Once | INTRAVENOUS | Status: DC

## 2015-01-24 MED ORDER — WARFARIN - PHARMACIST DOSING INPATIENT
Freq: Every day | Status: DC
  Administered 2015-02-02 – 2015-02-07 (×4)

## 2015-01-24 MED ORDER — GI COCKTAIL ~~LOC~~
30.0000 mL | Freq: Once | ORAL | Status: AC
Start: 2015-01-24 — End: 2015-01-24
  Administered 2015-01-24: 30 mL via ORAL
  Filled 2015-01-24: qty 30

## 2015-01-24 MED ORDER — SODIUM CHLORIDE 0.9 % IV BOLUS (SEPSIS)
1000.0000 mL | Freq: Once | INTRAVENOUS | Status: AC
  Administered 2015-01-24: 1000 mL via INTRAVENOUS

## 2015-01-24 MED ORDER — DEXTROSE 5 % IV SOLN
INTRAVENOUS | Status: DC
  Administered 2015-01-24: 12:00:00 via INTRAVENOUS

## 2015-01-24 MED ORDER — PANTOPRAZOLE SODIUM 40 MG PO TBEC
40.0000 mg | DELAYED_RELEASE_TABLET | Freq: Every day | ORAL | Status: DC
  Administered 2015-01-24 – 2015-02-11 (×15): 40 mg via ORAL
  Filled 2015-01-24 (×17): qty 1

## 2015-01-24 MED ORDER — SODIUM CHLORIDE 0.45 % IV SOLN
INTRAVENOUS | Status: DC
  Administered 2015-01-24 (×2): via INTRAVENOUS

## 2015-01-24 MED ORDER — PROMETHAZINE HCL 25 MG/ML IJ SOLN
12.5000 mg | Freq: Four times a day (QID) | INTRAMUSCULAR | Status: DC | PRN
  Administered 2015-01-29 – 2015-02-10 (×12): 12.5 mg via INTRAVENOUS
  Filled 2015-01-24 (×12): qty 1

## 2015-01-24 MED ORDER — DOXYCYCLINE HYCLATE 100 MG PO TABS
100.0000 mg | ORAL_TABLET | Freq: Two times a day (BID) | ORAL | Status: AC
  Administered 2015-01-24 – 2015-01-25 (×4): 100 mg via ORAL
  Filled 2015-01-24 (×4): qty 1

## 2015-01-24 MED ORDER — ONDANSETRON HCL 4 MG/2ML IJ SOLN
4.0000 mg | Freq: Once | INTRAMUSCULAR | Status: AC
  Administered 2015-01-24: 4 mg via INTRAVENOUS
  Filled 2015-01-24: qty 2

## 2015-01-24 NOTE — ED Notes (Signed)
IV would not pull back.  Will call phlebotomy

## 2015-01-24 NOTE — ED Provider Notes (Signed)
CSN: [id number]     Arrival date & time 01/24/15  8841 History   First MD Initiated Contact with Patient 01/24/15 [phone]     Chief Complaint  Patient presents with  . Abdominal Pain     (Consider location/radiation/quality/duration/timing/severity/associated sxs/prior Treatment) Patient is a 69 y.o. male presenting with abdominal pain.  Abdominal Pain Pain location:  Epigastric Pain quality: aching and sharp   Pain radiates to:  Does not radiate Pain severity:  Mild Onset quality:  Gradual Timing:  Constant Chronicity:  New Context: diet changes and recent illness   Context: not alcohol use   Relieved by:  None tried Worsened by:  Nothing tried Ineffective treatments:  None tried Associated symptoms: fatigue, nausea and vomiting   Associated symptoms: no cough, no dysuria, no fever and no shortness of breath     Past Medical History  Diagnosis Date  . Hypertension   . HLD (hyperlipidemia)   . Erectile dysfunction   . Chronic back pain   . CAD (coronary artery disease)   . GERD (gastroesophageal reflux disease)   . Arthritis     knees,lower back  . Carotid artery disease (HCC)   . PVD (peripheral vascular disease) with claudication (HCC)   . AAA (abdominal aortic aneurysm) without rupture (HCC) 06/2014    no change in last exam from 2015  . History of CVA (cerebrovascular accident)     2009  &  2013  . Type 2 diabetes mellitus (HCC)     no meds per pt  . Atrial flutter with rapid ventricular response (HCC) 01/16/2015  . Atrial fibrillation [facility])    Past Surgical History  Procedure Laterality Date  . Esophagogastroduodenoscopy N/A 05/15/2014    Procedure: ESOPHAGOGASTRODUODENOSCOPY (EGD);  Surgeon: Carman Ching, MD;  Location: [facility] ENDOSCOPY;  Service: Endoscopy;  Laterality: N/A;  . Cataract extraction w/ intraocular lens  implant, bilateral  april and may 2014  . Transthoracic echocardiogram  01-07-2012    mild to moderate dilated LV,  ef 45-50%,  mild basal  hypokinesis,  grade I diastolic  dysfunction/  trivial AR/  mild MR  . Cardiovascular stress test  08-09-2009    mild global hypokinesis,  no ischemia or scar/  ef 41%  . Cystoscopy with urethral dilatation N/A 11/24/2014    Procedure: CYSTOSCOPY WITH URETHRAL DILATATION;  Surgeon: Crist Fat, MD;  Location: [facility];  Service: Urology;  Laterality: N/A;  BALLOON DILATION        . Cystoscopy with retrograde urethrogram N/A 11/24/2014    Procedure: CYSTOSCOPY WITH RETROGRADE URETHROGRAM;  Surgeon: Crist Fat, MD;  Location: [name];  Service: Urology;  Laterality: N/A;   Family History  Problem Relation Age of Onset  . Lung disease Brother   . Kidney disease Brother   . Heart disease Brother     died at 42  . Heart disease Mother   . Kidney disease Mother   . Lung disease Mother    Social History  Substance Use Topics  . Smoking status: Former Smoker -- 0.50 packs/day for 40 years    Types: Cigarettes    Quit date: 01/29/2004  . Smokeless tobacco: Never Used  . Alcohol Use: No    Review of Systems  Constitutional: Positive for activity change, appetite change, fatigue and unexpected weight change. Negative for fever.  Eyes: Negative for pain.  Respiratory: Negative for cough and shortness of breath.   Gastrointestinal: Positive for nausea, vomiting and abdominal pain.  Endocrine: Negative for polydipsia and polyuria.  Genitourinary: Negative for dysuria, frequency and enuresis.  All other systems reviewed and are negative.     Allergies  Novocain  Home Medications   Prior to Admission medications   Medication Sig Start Date End Date Taking? Authorizing Provider  amLODipine (NORVASC) 10 MG tablet Take 1 tablet (10 mg total) by mouth daily. 02/24/14   Stephanie Coup Street, MD  aspirin EC 81 MG tablet Take 81 mg by mouth daily.    Historical Provider, MD  atorvastatin (LIPITOR) 40 MG tablet Take 1 tablet (40 mg  total) by mouth daily. 09/27/14   Erasmo Downer, MD  calcium carbonate (TUMS - DOSED IN MG ELEMENTAL CALCIUM) 500 MG chewable tablet Chew 1 tablet by mouth 2 (two) times daily as needed for indigestion or heartburn.    Historical Provider, MD  doxycycline (VIBRA-TABS) 100 MG tablet Take 1 tablet (100 mg total) by mouth every 12 (twelve) hours. 01/20/15   Joanna Puff, MD  finasteride (PROSCAR) 5 MG tablet Take 1 tablet (5 mg total) by mouth daily. Patient not taking: Reported on 01/22/2015 04/26/13   Stephanie Coup Street, MD  gabapentin (NEURONTIN) 100 MG capsule Take 1 capsule (100 mg total) by mouth 3 (three) times daily. Patient not taking: Reported on 01/14/2015 07/23/14   Stephanie Coup Street, MD  HYDROcodone-acetaminophen [facility]) 5-325 MG tablet Take 1-2 tablets by mouth every 4 (four) hours as needed for severe pain. 01/22/15   Loren Racer, MD  meclizine (ANTIVERT) 25 MG tablet Take 1 tablet (25 mg total) by mouth 3 (three) times daily as needed for dizziness. 10/14/14   Federico Flake, MD  metFORMIN (GLUCOPHAGE) 500 MG tablet Take 1 tablet (500 mg total) by mouth daily. Patient not taking: Reported on 01/14/2015 09/14/13   Myra Rude, MD  omeprazole (PRILOSEC) 20 MG capsule Take 1 capsule (20 mg total) by mouth daily. 01/22/15   Loren Racer, MD  ondansetron (ZOFRAN) 4 MG tablet Take 1 tablet (4 mg total) by mouth every 6 (six) hours as needed for nausea or vomiting. 01/22/15   Loren Racer, MD  phenazopyridine (PYRIDIUM) 200 MG tablet Take 1 tablet (200 mg total) by mouth 3 (three) times daily as needed for pain. Patient not taking: Reported on 01/14/2015 11/24/14   Crist Fat, MD  sucralfate (CARAFATE) 1 G tablet Take 1 tablet (1 g total) by mouth 3 (three) times daily as needed. 01/22/15   Loren Racer, MD  tamsulosin (FLOMAX) 0.4 MG CAPS capsule Take 1 capsule (0.4 mg total) by mouth daily. Patient not taking: Reported on 01/14/2015 04/07/14    Stephanie Coup Street, MD  traMADol (ULTRAM) 50 MG tablet Take 1-2 tablets (50-100 mg total) by mouth every 8 (eight) hours as needed for moderate pain. 11/24/14   Crist Fat, MD  warfarin (COUMADIN) 2.5 MG tablet Take 1 tablet (2.5 mg total) by mouth daily. Starting 12/24, then as directed from INR check on 12/27 01/20/15   Joanna Puff, MD   BP 140/101 mmHg  Pulse 98  Temp(Src) 98.8 F (37.1 C) (Oral)  Resp 18  Ht 6\' 4"  (1.93 m)  Wt 173 lb 14.4 oz (78.881 kg)  BMI 21.18 kg/m2  SpO2 100% Physical Exam  Constitutional: He is oriented to person, place, and time. He appears well-developed. He appears cachectic. He appears ill.  HENT:  Head: Normocephalic and atraumatic.  Neck: Normal range of motion.  Cardiovascular: Normal rate.   Pulmonary/Chest: Effort normal  and breath sounds normal. No respiratory distress. He has no wheezes.  Abdominal: He exhibits no distension.  Musculoskeletal: Normal range of motion. He exhibits no edema or tenderness.  Neurological: He is alert and oriented to person, place, and time.  Nursing note and vitals reviewed.   ED Course  Procedures (including critical care time) Labs Review Labs Reviewed  CBC WITH DIFFERENTIAL/PLATELET - Abnormal; Notable for the following:    WBC 12.3 (*)    RDW 15.7 (*)    Neutro Abs 10.3 (*)    All other components within normal limits  BASIC METABOLIC PANEL - Abnormal; Notable for the following:    Sodium 155 (*)    Potassium 5.5 (*)    Chloride 122 (*)    CO2 20 (*)    Glucose, Bld 150 (*)    BUN 88 (*)    Creatinine, Ser 4.12 (*)    GFR calc non Af Amer 14 (*)    GFR calc Af Amer 16 (*)    All other components within normal limits  LIPASE, BLOOD - Abnormal; Notable for the following:    Lipase 338 (*)    All other components within normal limits  URINALYSIS, ROUTINE W REFLEX MICROSCOPIC (NOT AT [facility]) - Abnormal; Notable for the following:    APPearance TURBID (*)    Hgb urine dipstick LARGE (*)     Protein, ur 100 (*)    Leukocytes, UA TRACE (*)    All other components within normal limits  URINE MICROSCOPIC-ADD ON - Abnormal; Notable for the following:    Squamous Epithelial / LPF 6-30 (*)    Bacteria, UA MANY (*)    Casts GRANULAR CAST (*)    Crystals URIC ACID CRYSTALS (*)    All other components within normal limits  PROTIME-INR - Abnormal; Notable for the following:    Prothrombin Time 51.2 (*)    INR 5.94 (*)    All other components within normal limits  I-STAT CHEM 8, ED - Abnormal; Notable for the following:    Sodium 156 (*)    Potassium 5.3 (*)    Chloride 125 (*)    BUN 80 (*)    Creatinine, Ser 3.30 (*)    Glucose, Bld 144 (*)    All other components within normal limits  LACTIC ACID, PLASMA  LACTIC ACID, PLASMA  BASIC METABOLIC PANEL  OSMOLALITY, URINE  BASIC METABOLIC PANEL  CBC  PROTIME-INR    Imaging Review Ct Abdomen Pelvis Wo Contrast  01/24/2015  CLINICAL DATA:  Epigastric pain and vomiting for 2 weeks. Recent hospital admission with renal failure. Evaluation for mass. EXAM: CT CHEST, ABDOMEN AND PELVIS WITHOUT CONTRAST TECHNIQUE: Multidetector CT imaging of the chest, abdomen and pelvis was performed following the standard protocol without IV contrast. COMPARISON:  Chest and abdominal radiographs 01/22/2015. Abdominal ultrasound 01/16/2015. CT abdomen and pelvis 09/02/2012. FINDINGS: CT CHEST No enlarged axillary, mediastinal, or hilar lymph nodes are identified. The thoracic aorta is normal in caliber. The heart is normal in size. LAD and left circumflex coronary artery calcification is noted. There is no pleural or pericardial effusion. Centrilobular emphysema is noted. There is a 3 mm nodule along the inferior aspect of the left major fissure (series 3, image 41). A cluster of small nodules is noted in the basilar right lower lobe (series 3, image 57). No acute osseous abnormality is identified. CT ABDOMEN AND PELVIS The liver, spleen, adrenal glands,  and kidneys have an unremarkable unenhanced appearance. Subtly increased  layering density in the gallbladder likely reflects sludge described on the prior ultrasound. There is no biliary dilatation. Small calcifications near the pancreatic head are unchanged and may reflect prior pancreatitis. There is no evidence of bowel obstruction or inflammation. The appendix is unremarkable. Mild aneurysmal dilatation of the infrarenal abdominal aorta is again seen and measures up to 3.1 cm. Common iliac arteries measure 2.0 cm on the right and 2.2 cm on the left, similar to the prior CT. No free fluid or enlarged lymph nodes are identified. A Foley catheter and gas are present in the bladder with suggestion of wall thickening. Gas is also seen in small anterior and superior bladder wall outpouchings which filled with contrast on the prior CT. No free fluid or enlarged lymph nodes are identified. There is trace anterolisthesis of L3 on L4 which appears facet mediated. IMPRESSION: 1. Cluster of small nodules in the basilar right lower lobe, likely infectious/inflammatory. Additional 3 mm nodule in the left lower lobe. If the patient is at high risk for bronchogenic carcinoma, follow-up chest CT at 1 year is recommended. If the patient is at low risk, no follow-up is needed. This recommendation follows the consensus statement: Guidelines for Management of Small Pulmonary Nodules Detected on CT Scans: A Statement from the Fleischner Society as published in Radiology 2005; 237:395-400. 2. Unchanged calcifications near the pancreatic head which may reflect prior pancreatitis. 3. Infrarenal abdominal aortic and bilateral common iliac artery aneurysms. 4. Bladder wall thickening which may reflect cystitis. Electronically Signed   By: Sebastian Ache M.D.   On: 01/24/2015 11:04   Ct Head Wo Contrast  01/24/2015  CLINICAL DATA:  Headache, nausea and vomiting for 2 weeks, history hypertension, abdominal aortic aneurysm, diabetes  mellitus, stroke, atrial fibrillation/flutter, former smoker EXAM: CT HEAD WITHOUT CONTRAST TECHNIQUE: Contiguous axial images were obtained from the base of the skull through the vertex without intravenous contrast. COMPARISON:  03/17/2013 FINDINGS: Generalized atrophy. Normal ventricular morphology. No midline shift or mass effect. Small vessel chronic ischemic changes of deep cerebral white matter. Old RIGHT basal ganglia lacunar infarct with questionable old tiny infarct at RIGHT thalamus. Old inferior RIGHT cerebellar infarct. Question old lacunar infarct versus prominent perivascular space at inferior LEFT basal ganglia. No intracranial hemorrhage, mass lesion, or evidence acute infarction. No extra-axial fluid collections. Extensive atherosclerotic calcification of internal carotid and vertebral arteries at skullbase. No acute osseous findings. IMPRESSION: Atrophy with small vessel chronic ischemic changes of deep cerebral white matter. Old infarcts as above. No new intracranial abnormalities identified. Electronically Signed   By: Ulyses Southward M.D.   On: 01/24/2015 10:46   Ct Chest Wo Contrast  01/24/2015  CLINICAL DATA:  Epigastric pain and vomiting for 2 weeks. Recent hospital admission with renal failure. Evaluation for mass. EXAM: CT CHEST, ABDOMEN AND PELVIS WITHOUT CONTRAST TECHNIQUE: Multidetector CT imaging of the chest, abdomen and pelvis was performed following the standard protocol without IV contrast. COMPARISON:  Chest and abdominal radiographs 01/22/2015. Abdominal ultrasound 01/16/2015. CT abdomen and pelvis 09/02/2012. FINDINGS: CT CHEST No enlarged axillary, mediastinal, or hilar lymph nodes are identified. The thoracic aorta is normal in caliber. The heart is normal in size. LAD and left circumflex coronary artery calcification is noted. There is no pleural or pericardial effusion. Centrilobular emphysema is noted. There is a 3 mm nodule along the inferior aspect of the left major  fissure (series 3, image 41). A cluster of small nodules is noted in the basilar right lower lobe (series 3, image 57).  No acute osseous abnormality is identified. CT ABDOMEN AND PELVIS The liver, spleen, adrenal glands, and kidneys have an unremarkable unenhanced appearance. Subtly increased layering density in the gallbladder likely reflects sludge described on the prior ultrasound. There is no biliary dilatation. Small calcifications near the pancreatic head are unchanged and may reflect prior pancreatitis. There is no evidence of bowel obstruction or inflammation. The appendix is unremarkable. Mild aneurysmal dilatation of the infrarenal abdominal aorta is again seen and measures up to 3.1 cm. Common iliac arteries measure 2.0 cm on the right and 2.2 cm on the left, similar to the prior CT. No free fluid or enlarged lymph nodes are identified. A Foley catheter and gas are present in the bladder with suggestion of wall thickening. Gas is also seen in small anterior and superior bladder wall outpouchings which filled with contrast on the prior CT. No free fluid or enlarged lymph nodes are identified. There is trace anterolisthesis of L3 on L4 which appears facet mediated. IMPRESSION: 1. Cluster of small nodules in the basilar right lower lobe, likely infectious/inflammatory. Additional 3 mm nodule in the left lower lobe. If the patient is at high risk for bronchogenic carcinoma, follow-up chest CT at 1 year is recommended. If the patient is at low risk, no follow-up is needed. This recommendation follows the consensus statement: Guidelines for Management of Small Pulmonary Nodules Detected on CT Scans: A Statement from the Fleischner Society as published in Radiology 2005; 237:395-400. 2. Unchanged calcifications near the pancreatic head which may reflect prior pancreatitis. 3. Infrarenal abdominal aortic and bilateral common iliac artery aneurysms. 4. Bladder wall thickening which may reflect cystitis.  Electronically Signed   By: Sebastian Ache M.D.   On: 01/24/2015 11:04   I have personally reviewed and evaluated these images and lab results as part of my medical decision-making.   EKG Interpretation   Date/Time:  Tuesday January 24 2015 09:27:49 EST Ventricular Rate:  96 PR Interval:  132 QRS Duration: 86 QT Interval:  345 QTC Calculation: 436 R Axis:   47 Text Interpretation:  Sinus rhythm Nonspecific T abnormalities, lateral  leads Baseline wander in lead(s) II III aVF V6 No significant change since  last tracing Confirmed by [facility] MD, Barbara Cower [phone]) on 01/24/2015 10:52:44  AM      MDM   Final diagnoses:  Dehydration  Balance problem  Idiopathic acute pancreatitis, unspecified complication status  Bladder outlet obstruction  UTI (lower urinary tract infection)  AKI (acute kidney injury) (HCC)  Hypernatremia  Hyperchloremia     69 year old male here with significant weight loss acute on chronic kidney injury secondary to poor by mouth intake which has been secondary to persistent nausea, vomiting and epigastric pain. Recently discharged from hospital 4 days ago for the same seen in the ED department 2 days ago for the same however was tolerating by mouth at that visit. However now he is not and appears cachectic on exam slight tachycardia. Found also be hyponatremic and have a multiple other problems. CT of his head abdomen and chest were done to evaluate for type of the cancer  nothingcould be seen however was  Not able to utilize contrast secondary to his kidney injury. This was all negative , however patient did have a lipase over 300 so was admitted to family medicine for further workup.    Marily Memos, MD 01/24/15 [phone]

## 2015-01-24 NOTE — ED Notes (Signed)
Pt recently admitted to [facility] for abdominal pain with vomiting pt was dx with kidney failure.  Pt was d/c from [facility] On Friday.  Pt brought in EMS for continued epigastric pain and vomiting.

## 2015-01-24 NOTE — Progress Notes (Addendum)
ANTICOAGULATION CONSULT NOTE - Initial Consult  Pharmacy Consult for coumadin Indication: atrial fibrillation  Allergies  Allergen Reactions  . Novocain [Procaine Hcl] Nausea And Vomiting    Patient Measurements: Height: 6\' 4"  (193 cm) Weight: 173 lb 14.4 oz (78.881 kg) IBW/kg (Calculated) : 86.8   Vital Signs: Temp: 98.8 F (37.1 C) (12/27 1315) Temp Source: Oral (12/27 1315) BP: 149/103 mmHg (12/27 1315) Pulse Rate: 98 (12/27 1315)  Labs:  Recent Labs  01/22/15 1610 01/22/15 1721 01/24/15 1029  HGB 14.2  --  14.6  16.0  HCT 43.5  --  45.2  47.0  PLT 247  --  183  LABPROT  --  40.6*  --   INR  --  4.37*  --   CREATININE 3.66*  --  4.12*  3.30*    Estimated Creatinine Clearance: 19.4 mL/min (by C-G formula based on Cr of 4.12).   Medical History: Past Medical History  Diagnosis Date  . Hypertension   . HLD (hyperlipidemia)   . Erectile dysfunction   . Chronic back pain   . CAD (coronary artery disease)   . GERD (gastroesophageal reflux disease)   . Arthritis     knees,lower back  . Carotid artery disease (HCC)   . PVD (peripheral vascular disease) with claudication (HCC)   . AAA (abdominal aortic aneurysm) without rupture (HCC) 06/2014    no change in last exam from 2015  . History of CVA (cerebrovascular accident)     2009  &  2013  . Type 2 diabetes mellitus (HCC)     no meds per pt  . Atrial flutter with rapid ventricular response (HCC) 01/16/2015  . Atrial fibrillation [facility])     Assessment:  69 yo man admitted w/ abd pain/N/V/D.  coumadin started 01/14/15 for new onset afib. Now re-admitted and pharmacy to dose coumadin.  His last dose of coumadin was 2.5 mg on Saturday 12/24 - he was in the ED and told not to take anymore ( per family member report)  12/25 INR 4.37; CBC WNL  Warf score = 6;  12/27 INR 5.94, CBC WNL- no bleeding reported  Goal of Therapy:  INR 2-3   Plan:  - no coumadin today - daily INR  Herby Abraham,  Pharm.D. [phone] 01/24/2015 1:27 PM

## 2015-01-24 NOTE — Progress Notes (Signed)
CRITICAL VALUE ALERT  Critical value received: INR 5.94  Date of notification:  01/24/15  Time of notification:  2:37pm  Critical value read back: yes  Nurse who received alert: Faustino Congress, RN  MD notified (1st page): Tawni Carnes, MD  Time of first page: 2:40pm  MD notified (2nd page):  Time of second page:  Responding MD:    Time MD responded:

## 2015-01-24 NOTE — H&P (Signed)
Family Medicine [facility] Admission History and Physical Service Pager: [phone]  Patient name: Brian Burns Medical record number: [id number] Date of birth: 1945/07/01 Age: 69 y.o. Gender: male  Primary Care Provider: Shirlee Latch, MD Consultants:  Code Status: DNR (confirmed on admission)  Chief Complaint: abdominal pain  Assessment and Plan: Brian Burns is a 69 y.o. male presenting with abdominal pain, weight loss. PMH is significant for urethral strictures, CAD, paroxysmal afib, CKD, HTN, CVA x2, BPH, DM 2, AAA.   Dehydration/Nausea/Vomiting: Sodium 156, K+ 5.5, SCr 4.12 (up from 3.3); noted that lipase has continued to trend up [phone]. Weight seems to be about 15lbs down from last hospitalization. CT scan head/chest/abd pelv were mostly non-focal, a few reactive nodules on right lung and left lung, known AAA, some pancreatic calcifications. Leading DDx would be mild pancreatitis. - telemetry - 1/2 NS @ 150cc/hr for initial repletion - repeat BMP this evening - phenergan 12.5mg  IV q6hrs for nausea - diet as tolerated  AKI on CKD: SCr 4.12. Prior to last discharge 3.3 (was up to 7 on that admission). Baseline before past month seems to be around 1.3. Nephrology was previously consulted for this and agreed with post obstruction causing prior AKI.  - fluids as above  Hyperkalemia: - fluids, re-check BMP in afternoon  BPH: apparently hasn't been taking finasteride or flomax - continue foley - monitor UOP  Prior UTI: was on doxycycline for UTI treatment from last hospitalization. Apparently was taking only once a day. End date is 12/28 - continue doxycycline through 12/28  Paroxysmal afib: in NSR currently, anticoagulated with warfarin - needs INR check today - warfarin per pharm  Left lung nodule: extensive smoking history - repeat CT scan in 1 year    FEN/GI: heart healthy / D5W Prophylaxis: warfarin  Disposition: admit  History of  Present Illness:  Brian Burns is a 69 y.o. male presenting with periumbilical abdominal pain, nausea and persistent vomiting. He says the pain is like a burning, does not radiate. It started when he got home after discharge on 12/23. Located right above his belly button, slightly to the right side of his abdomen as well. Does not radiate into his back or down to his groin. Pain has been there consistently, doesn't say anything makes it better or worse. Pain rates normally around 5-6. Nausea and vomiting has been persistent since during last hospitalization, says he hasn't been able to keep anything down at all. Appetite has not been good either (daughter at bedside says he normally has a big appetite but the past month he has only nibbled). Says last BM was 12/16, time before that was 12/5. He says he has been peeing "a lot" -- currently has foley in. No noted blood in urine except for in the ED daughter thinks may have noticed some around the foley (not in bag). No fevers, no trouble swallowing, no chest pain or short of breath (but daughter says he has mostly been laying in bed and when he does get up and walk it is only short distances), no burning on urination, no headaches.  Recently hospitalized from 12/18 to 12/23 for acute on chronic renal failure from suspected bladder outlet obstruction (had undergone urethral stricture dilatation month prior and was having problems with self catheterization). Cr up to 7.48 on admission and down to 3.07 at discharge. Also found to be new onset afib and started on coumadin during that hospitalization.   Review Of Systems: Per HPI with  the following additions: none Otherwise the remainder of the systems were negative.  Patient Active Problem List   Diagnosis Date Noted  . Bladder outlet obstruction 01/20/2015  . Balance problem   . Idiopathic acute pancreatitis   . UTI (lower urinary tract infection)   . Staphylococcal infection 01/17/2015  .  Protein-calorie malnutrition, severe 01/16/2015  . Atrial flutter with rapid ventricular response (HCC) 01/16/2015  . Abdominal pain   . Intractable hiccups   . Renal failure (ARF), acute on chronic (HCC) 01/15/2015  . AKI (acute kidney injury) (HCC)   . Hyperkalemia   . Hypernatremia   . Catheter-associated urinary tract infection (HCC)   . Renal failure 01/14/2015  . Actinic keratosis 10/07/2014  . Bilateral wrist pain 09/09/2014  . Food impaction of esophagus 06/01/2014  . Decreased visual acuity 04/08/2014  . Acute cystitis with positive culture 03/30/2014  . Dysuria 03/18/2014  . Polyuria 03/18/2014  . Loss of weight 03/18/2014  . Callus of foot 12/02/2013  . GERD (gastroesophageal reflux disease) 10/21/2013  . Chest congestion 10/21/2013  . Right lower lobe pneumonia 10/11/2013  . Constipation 10/11/2013  . Type 2 diabetes mellitus (HCC) 09/14/2013  . AAA (abdominal aortic aneurysm) without rupture (HCC) 06/24/2013  . PVD (peripheral vascular disease) (HCC) 06/24/2013  . Throat discomfort 04/09/2013  . Vertigo 03/18/2013  . Hearing loss of both ears 03/18/2013  . BPH (benign prostatic hyperplasia) 02/16/2013  . Urethral stricture 02/16/2013  . Healthcare maintenance 02/16/2013  . Other malaise and fatigue 07/17/2012  . Pain in joint, pelvic region and thigh 06/18/2012  . Back pain 06/18/2012  . Peripheral vascular disease, unspecified (HCC) 06/18/2012  . Hematuria 06/11/2012  . Neuropathic pain of both legs 05/10/2012  . CVA (cerebral infarction) 01/10/2012  . TIA (transient ischemic attack) 01/08/2012  . Knee pain, bilateral 04/09/2011  . Urinary frequency 04/09/2011  . Carotid artery calcification 07/20/2009  . PVD WITH CLAUDICATION 07/20/2009  . BACK PAIN, LUMBAR, CHRONIC 11/27/2007  . Hyperlipidemia 02/05/2007  . Overweight(278.02) 06/04/2006  . HYPERTENSION, BENIGN SYSTEMIC 03/27/2006  . Coronary atherosclerosis 03/27/2006  . IMPOTENCE, ORGANIC 03/27/2006     Past Medical History: Past Medical History  Diagnosis Date  . Hypertension   . HLD (hyperlipidemia)   . Erectile dysfunction   . Chronic back pain   . CAD (coronary artery disease)   . GERD (gastroesophageal reflux disease)   . Arthritis     knees,lower back  . Carotid artery disease (HCC)   . PVD (peripheral vascular disease) with claudication (HCC)   . AAA (abdominal aortic aneurysm) without rupture (HCC) 06/2014    no change in last exam from 2015  . History of CVA (cerebrovascular accident)     2009  &  2013  . Type 2 diabetes mellitus (HCC)     no meds per pt  . Atrial flutter with rapid ventricular response (HCC) 01/16/2015  . Atrial fibrillation [facility])     Past Surgical History: Past Surgical History  Procedure Laterality Date  . Esophagogastroduodenoscopy N/A 05/15/2014    Procedure: ESOPHAGOGASTRODUODENOSCOPY (EGD);  Surgeon: Carman Ching, MD;  Location: [facility] ENDOSCOPY;  Service: Endoscopy;  Laterality: N/A;  . Cataract extraction w/ intraocular lens  implant, bilateral  april and may 2014  . Transthoracic echocardiogram  01-07-2012    mild to moderate dilated LV,  ef 45-50%,  mild basal hypokinesis,  grade I diastolic  dysfunction/  trivial AR/  mild MR  . Cardiovascular stress test  08-09-2009    mild  global hypokinesis,  no ischemia or scar/  ef 41%  . Cystoscopy with urethral dilatation N/A 11/24/2014    Procedure: CYSTOSCOPY WITH URETHRAL DILATATION;  Surgeon: Crist Fat, MD;  Location: [facility];  Service: Urology;  Laterality: N/A;  BALLOON DILATION        . Cystoscopy with retrograde urethrogram N/A 11/24/2014    Procedure: CYSTOSCOPY WITH RETROGRADE URETHROGRAM;  Surgeon: Crist Fat, MD;  [facility]  Service: Urology;  Laterality: N/A;    Social History: Social History  Substance Use Topics  . Smoking status: Former Smoker -- 0.50 packs/day for 40 years    Types: Cigarettes    Quit date:  01/29/2004  . Smokeless tobacco: Never Used  . Alcohol Use: No   Additional social history: none Please also refer to relevant sections of EMR.  Family History: Family History  Problem Relation Age of Onset  . Lung disease Brother   . Kidney disease Brother   . Heart disease Brother     died at 5  . Heart disease Mother   . Kidney disease Mother   . Lung disease Mother    Allergies and Medications: Allergies  Allergen Reactions  . Novocain [Procaine Hcl] Nausea And Vomiting   No current facility-administered medications on file prior to encounter.   Current Outpatient Prescriptions on File Prior to Encounter  Medication Sig Dispense Refill  . amLODipine (NORVASC) 10 MG tablet Take 1 tablet (10 mg total) by mouth daily. 90 tablet 1  . aspirin EC 81 MG tablet Take 81 mg by mouth daily.    Marland Kitchen atorvastatin (LIPITOR) 40 MG tablet Take 1 tablet (40 mg total) by mouth daily. 90 tablet 1  . calcium carbonate (TUMS - DOSED IN MG ELEMENTAL CALCIUM) 500 MG chewable tablet Chew 1 tablet by mouth 2 (two) times daily as needed for indigestion or heartburn.    . doxycycline (VIBRA-TABS) 100 MG tablet Take 1 tablet (100 mg total) by mouth every 12 (twelve) hours. 11 tablet 0  . finasteride (PROSCAR) 5 MG tablet Take 1 tablet (5 mg total) by mouth daily. (Patient not taking: Reported on 01/22/2015) 30 tablet 1  . gabapentin (NEURONTIN) 100 MG capsule Take 1 capsule (100 mg total) by mouth 3 (three) times daily. (Patient not taking: Reported on 01/14/2015) 180 capsule 2  . HYDROcodone-acetaminophen (NORCO) 5-325 MG tablet Take 1-2 tablets by mouth every 4 (four) hours as needed for severe pain. 10 tablet 0  . meclizine (ANTIVERT) 25 MG tablet Take 1 tablet (25 mg total) by mouth 3 (three) times daily as needed for dizziness. 30 tablet 1  . metFORMIN (GLUCOPHAGE) 500 MG tablet Take 1 tablet (500 mg total) by mouth daily. (Patient not taking: Reported on 01/14/2015) 180 tablet 3  . omeprazole  (PRILOSEC) 20 MG capsule Take 1 capsule (20 mg total) by mouth daily. 30 capsule 0  . ondansetron (ZOFRAN) 4 MG tablet Take 1 tablet (4 mg total) by mouth every 6 (six) hours as needed for nausea or vomiting. 12 tablet 0  . phenazopyridine (PYRIDIUM) 200 MG tablet Take 1 tablet (200 mg total) by mouth 3 (three) times daily as needed for pain. (Patient not taking: Reported on 01/14/2015) 10 tablet 0  . sucralfate (CARAFATE) 1 G tablet Take 1 tablet (1 g total) by mouth 3 (three) times daily as needed. 30 tablet 0  . tamsulosin (FLOMAX) 0.4 MG CAPS capsule Take 1 capsule (0.4 mg total) by mouth daily. (Patient  not taking: Reported on 01/14/2015) 30 capsule 3  . traMADol (ULTRAM) 50 MG tablet Take 1-2 tablets (50-100 mg total) by mouth every 8 (eight) hours as needed for moderate pain. 50 tablet 0  . warfarin (COUMADIN) 2.5 MG tablet Take 1 tablet (2.5 mg total) by mouth daily. Starting 12/24, then as directed from INR check on 12/27 20 tablet 0    Objective: BP 151/106 mmHg  Pulse 88  Temp(Src) 97.7 F (36.5 C) (Oral)  Resp 20  Ht 6\' 4"  (1.93 m)  Wt 175 lb (79.379 kg)  BMI 21.31 kg/m2  SpO2 99% Exam: General: NAD, laying on his side in bed Eyes: PERRL, EOMI, arcus senilis bilaterally  ENTM: dry mucous membranes, edentulous. Neck: thin, supple, no thyromegaly Cardiovascular: RRR, normal heart sounds, no murmur appreciated. 2+ radial and PT pulses bilaterally  Respiratory: clear to auscultation bilaterally, normal effort Abdomen: soft, thin, minimally tender above umbilicus, no guarding or rebound, decreased bowel sounds MSK: thin, no deformities Skin: no rashes noted Neuro: alert and oriented, grossly no deficits Psych: normal thought content and speech  Labs and Imaging: CBC BMET   Recent Labs Lab 01/24/15 1029  WBC 12.3*  HGB 14.6  16.0  HCT 45.2  47.0  PLT 183    Recent Labs Lab 01/24/15 1029  NA 155*  156*  K 5.5*  5.3*  CL 122*  125*  CO2 20*  BUN 88*  80*   CREATININE 4.12*  3.30*  GLUCOSE 150*  144*  CALCIUM 9.5     Ct Abdomen Pelvis Wo Contrast  01/24/2015  CLINICAL DATA:  Epigastric pain and vomiting for 2 weeks. Recent hospital admission with renal failure. Evaluation for mass. EXAM: CT CHEST, ABDOMEN AND PELVIS WITHOUT CONTRAST TECHNIQUE: Multidetector CT imaging of the chest, abdomen and pelvis was performed following the standard protocol without IV contrast. COMPARISON:  Chest and abdominal radiographs 01/22/2015. Abdominal ultrasound 01/16/2015. CT abdomen and pelvis 09/02/2012. FINDINGS: CT CHEST No enlarged axillary, mediastinal, or hilar lymph nodes are identified. The thoracic aorta is normal in caliber. The heart is normal in size. LAD and left circumflex coronary artery calcification is noted. There is no pleural or pericardial effusion. Centrilobular emphysema is noted. There is a 3 mm nodule along the inferior aspect of the left major fissure (series 3, image 41). A cluster of small nodules is noted in the basilar right lower lobe (series 3, image 57). No acute osseous abnormality is identified. CT ABDOMEN AND PELVIS The liver, spleen, adrenal glands, and kidneys have an unremarkable unenhanced appearance. Subtly increased layering density in the gallbladder likely reflects sludge described on the prior ultrasound. There is no biliary dilatation. Small calcifications near the pancreatic head are unchanged and may reflect prior pancreatitis. There is no evidence of bowel obstruction or inflammation. The appendix is unremarkable. Mild aneurysmal dilatation of the infrarenal abdominal aorta is again seen and measures up to 3.1 cm. Common iliac arteries measure 2.0 cm on the right and 2.2 cm on the left, similar to the prior CT. No free fluid or enlarged lymph nodes are identified. A Foley catheter and gas are present in the bladder with suggestion of wall thickening. Gas is also seen in small anterior and superior bladder wall outpouchings  which filled with contrast on the prior CT. No free fluid or enlarged lymph nodes are identified. There is trace anterolisthesis of L3 on L4 which appears facet mediated. IMPRESSION: 1. Cluster of small nodules in the basilar right lower lobe, likely infectious/inflammatory.  Additional 3 mm nodule in the left lower lobe. If the patient is at high risk for bronchogenic carcinoma, follow-up chest CT at 1 year is recommended. If the patient is at low risk, no follow-up is needed. This recommendation follows the consensus statement: Guidelines for Management of Small Pulmonary Nodules Detected on CT Scans: A Statement from the Fleischner Society as published in Radiology 2005; 237:395-400. 2. Unchanged calcifications near the pancreatic head which may reflect prior pancreatitis. 3. Infrarenal abdominal aortic and bilateral common iliac artery aneurysms. 4. Bladder wall thickening which may reflect cystitis. Electronically Signed   By: Sebastian Ache M.D.   On: 01/24/2015 11:04   Ct Head Wo Contrast  01/24/2015  CLINICAL DATA:  Headache, nausea and vomiting for 2 weeks, history hypertension, abdominal aortic aneurysm, diabetes mellitus, stroke, atrial fibrillation/flutter, former smoker EXAM: CT HEAD WITHOUT CONTRAST TECHNIQUE: Contiguous axial images were obtained from the base of the skull through the vertex without intravenous contrast. COMPARISON:  03/17/2013 FINDINGS: Generalized atrophy. Normal ventricular morphology. No midline shift or mass effect. Small vessel chronic ischemic changes of deep cerebral white matter. Old RIGHT basal ganglia lacunar infarct with questionable old tiny infarct at RIGHT thalamus. Old inferior RIGHT cerebellar infarct. Question old lacunar infarct versus prominent perivascular space at inferior LEFT basal ganglia. No intracranial hemorrhage, mass lesion, or evidence acute infarction. No extra-axial fluid collections. Extensive atherosclerotic calcification of internal carotid and  vertebral arteries at skullbase. No acute osseous findings. IMPRESSION: Atrophy with small vessel chronic ischemic changes of deep cerebral white matter. Old infarcts as above. No new intracranial abnormalities identified. Electronically Signed   By: Ulyses Southward M.D.   On: 01/24/2015 10:46   Ct Chest Wo Contrast  01/24/2015  CLINICAL DATA:  Epigastric pain and vomiting for 2 weeks. Recent hospital admission with renal failure. Evaluation for mass. EXAM: CT CHEST, ABDOMEN AND PELVIS WITHOUT CONTRAST TECHNIQUE: Multidetector CT imaging of the chest, abdomen and pelvis was performed following the standard protocol without IV contrast. COMPARISON:  Chest and abdominal radiographs 01/22/2015. Abdominal ultrasound 01/16/2015. CT abdomen and pelvis 09/02/2012. FINDINGS: CT CHEST No enlarged axillary, mediastinal, or hilar lymph nodes are identified. The thoracic aorta is normal in caliber. The heart is normal in size. LAD and left circumflex coronary artery calcification is noted. There is no pleural or pericardial effusion. Centrilobular emphysema is noted. There is a 3 mm nodule along the inferior aspect of the left major fissure (series 3, image 41). A cluster of small nodules is noted in the basilar right lower lobe (series 3, image 57). No acute osseous abnormality is identified. CT ABDOMEN AND PELVIS The liver, spleen, adrenal glands, and kidneys have an unremarkable unenhanced appearance. Subtly increased layering density in the gallbladder likely reflects sludge described on the prior ultrasound. There is no biliary dilatation. Small calcifications near the pancreatic head are unchanged and may reflect prior pancreatitis. There is no evidence of bowel obstruction or inflammation. The appendix is unremarkable. Mild aneurysmal dilatation of the infrarenal abdominal aorta is again seen and measures up to 3.1 cm. Common iliac arteries measure 2.0 cm on the right and 2.2 cm on the left, similar to the prior CT. No  free fluid or enlarged lymph nodes are identified. A Foley catheter and gas are present in the bladder with suggestion of wall thickening. Gas is also seen in small anterior and superior bladder wall outpouchings which filled with contrast on the prior CT. No free fluid or enlarged lymph nodes are identified. There is  trace anterolisthesis of L3 on L4 which appears facet mediated. IMPRESSION: 1. Cluster of small nodules in the basilar right lower lobe, likely infectious/inflammatory. Additional 3 mm nodule in the left lower lobe. If the patient is at high risk for bronchogenic carcinoma, follow-up chest CT at 1 year is recommended. If the patient is at low risk, no follow-up is needed. This recommendation follows the consensus statement: Guidelines for Management of Small Pulmonary Nodules Detected on CT Scans: A Statement from the Fleischner Society as published in Radiology 2005; 237:395-400. 2. Unchanged calcifications near the pancreatic head which may reflect prior pancreatitis. 3. Infrarenal abdominal aortic and bilateral common iliac artery aneurysms. 4. Bladder wall thickening which may reflect cystitis. Electronically Signed   By: Sebastian Ache M.D.   On: 01/24/2015 11:04    Nani Ravens, MD 01/24/2015, 11:33 AM PGY-3, [facility] FPTS Intern pager: [phone], text pages welcome

## 2015-01-24 NOTE — Progress Notes (Signed)
UR Completed. Danese Dorsainvil, RN, BSN.  [phone] 

## 2015-01-25 DIAGNOSIS — E440 Moderate protein-calorie malnutrition: Secondary | ICD-10-CM | POA: Diagnosis present

## 2015-01-25 DIAGNOSIS — N390 Urinary tract infection, site not specified: Secondary | ICD-10-CM

## 2015-01-25 DIAGNOSIS — N179 Acute kidney failure, unspecified: Secondary | ICD-10-CM

## 2015-01-25 DIAGNOSIS — K8500 Idiopathic acute pancreatitis without necrosis or infection: Secondary | ICD-10-CM

## 2015-01-25 DIAGNOSIS — N320 Bladder-neck obstruction: Secondary | ICD-10-CM

## 2015-01-25 HISTORY — DX: Moderate protein-calorie malnutrition: E44.0

## 2015-01-25 LAB — BASIC METABOLIC PANEL
ANION GAP: 12 (ref 5–15)
Anion gap: 9 (ref 5–15)
Anion gap: 9 (ref 5–15)
Anion gap: 10 (ref 5–15)
BUN: 83 mg/dL — ABNORMAL HIGH (ref 6–20)
BUN: 79 mg/dL — ABNORMAL HIGH (ref 6–20)
BUN: 78 mg/dL — ABNORMAL HIGH (ref 6–20)
BUN: 71 mg/dL — ABNORMAL HIGH (ref 6–20)
CALCIUM: 8.9 mg/dL (ref 8.9–10.3)
CALCIUM: 8.8 mg/dL — AB (ref 8.9–10.3)
CALCIUM: 8.7 mg/dL — AB (ref 8.9–10.3)
CO2: 20 mmol/L — AB (ref 22–32)
CO2: 20 mmol/L — ABNORMAL LOW (ref 22–32)
CO2: 17 mmol/L — ABNORMAL LOW (ref 22–32)
CO2: 20 mmol/L — AB (ref 22–32)
CREATININE: 3.71 mg/dL — AB (ref 0.61–1.24)
CREATININE: 3.41 mg/dL — AB (ref 0.61–1.24)
CREATININE: 3.28 mg/dL — AB (ref 0.61–1.24)
Calcium: 9 mg/dL (ref 8.9–10.3)
Chloride: 122 mmol/L — ABNORMAL HIGH (ref 101–111)
Chloride: 123 mmol/L — ABNORMAL HIGH (ref 101–111)
Chloride: 126 mmol/L — ABNORMAL HIGH (ref 101–111)
Chloride: 120 mmol/L — ABNORMAL HIGH (ref 101–111)
Creatinine, Ser: 3.73 mg/dL — ABNORMAL HIGH (ref 0.61–1.24)
GFR calc Af Amer: 18 mL/min — ABNORMAL LOW (ref >60)
GFR calc non Af Amer: 18 mL/min — ABNORMAL LOW (ref >60)
GFR, EST AFRICAN AMERICAN: 18 mL/min — AB (ref >60)
GFR, EST AFRICAN AMERICAN: 20 mL/min — AB (ref >60)
GFR, EST AFRICAN AMERICAN: 21 mL/min — AB (ref >60)
GFR, EST NON AFRICAN AMERICAN: 15 mL/min — AB (ref >60)
GFR, EST NON AFRICAN AMERICAN: 16 mL/min — AB (ref >60)
GFR, EST NON AFRICAN AMERICAN: 17 mL/min — AB (ref >60)
GLUCOSE: 124 mg/dL — AB (ref 65–99)
Glucose, Bld: 180 mg/dL — ABNORMAL HIGH (ref 65–99)
Glucose, Bld: 149 mg/dL — ABNORMAL HIGH (ref 65–99)
Glucose, Bld: 158 mg/dL — ABNORMAL HIGH (ref 65–99)
POTASSIUM: 5.5 mmol/L — AB (ref 3.5–5.1)
Potassium: 5.3 mmol/L — ABNORMAL HIGH (ref 3.5–5.1)
Potassium: 5.2 mmol/L — ABNORMAL HIGH (ref 3.5–5.1)
Potassium: 4.9 mmol/L (ref 3.5–5.1)
SODIUM: 152 mmol/L — AB (ref 135–145)
SODIUM: 152 mmol/L — AB (ref 135–145)
SODIUM: 150 mmol/L — AB (ref 135–145)
Sodium: 154 mmol/L — ABNORMAL HIGH (ref 135–145)

## 2015-01-25 LAB — CBC
HEMATOCRIT: 42.6 % (ref 39.0–52.0)
HEMOGLOBIN: 13.3 g/dL (ref 13.0–17.0)
MCH: 27.4 pg (ref 26.0–34.0)
MCHC: 31.2 g/dL (ref 30.0–36.0)
MCV: 87.7 fL (ref 78.0–100.0)
Platelets: 170 10*3/uL (ref 150–400)
RBC: 4.86 MIL/uL (ref 4.22–5.81)
RDW: 15.7 % — ABNORMAL HIGH (ref 11.5–15.5)
WBC: 12.8 10*3/uL — ABNORMAL HIGH (ref 4.0–10.5)

## 2015-01-25 LAB — TSH: TSH: 1.682 u[IU]/mL (ref 0.350–4.500)

## 2015-01-25 LAB — C-REACTIVE PROTEIN: CRP: 0.5 mg/dL (ref <1.0)

## 2015-01-25 LAB — PROTIME-INR
INR: 5.59 (ref 0.00–1.49)
PROTHROMBIN TIME: 49 s — AB (ref 11.6–15.2)

## 2015-01-25 LAB — SEDIMENTATION RATE: SED RATE: 11 mm/h (ref 0–16)

## 2015-01-25 LAB — OSMOLALITY, URINE: OSMOLALITY UR: 553 mosm/kg (ref 300–900)

## 2015-01-25 MED ORDER — ENSURE ENLIVE PO LIQD
237.0000 mL | Freq: Three times a day (TID) | ORAL | Status: DC
  Administered 2015-01-25 – 2015-02-11 (×7): 237 mL via ORAL

## 2015-01-25 MED ORDER — DEXTROSE 5 % IV SOLN
INTRAVENOUS | Status: DC
  Administered 2015-01-25 – 2015-01-27 (×6): via INTRAVENOUS

## 2015-01-25 NOTE — NC FL2 (Signed)
[place] MEDICAID FL2 LEVEL OF CARE SCREENING TOOL     IDENTIFICATION  Patient Name: Brian Burns Birthdate: 26-Jul-1945 Sex: male Admission Date (Current Location): 01/24/2015  [facility] and [place] Number:  Producer, television/film/video and Address:  [facility], [address] [place], [place] [address]      Provider Number: [id number]  Attending Physician Name and Address:  Moses Manners, MD  Relative Name and Phone Number:       Current Level of Care: Hospital Recommended Level of Care: Skilled Nursing Facility Prior Approval Number:    Date Approved/Denied:   PASRR Number: [id number]  Discharge Plan: SNF    Current Diagnoses: Patient Active Problem List   Diagnosis Date Noted  . Weight loss, unintentional   . Dehydration   . Bladder outlet obstruction 01/20/2015  . Balance problem   . Idiopathic acute pancreatitis   . UTI (lower urinary tract infection)   . Staphylococcal infection 01/17/2015  . Protein-calorie malnutrition, severe 01/16/2015  . Atrial flutter with rapid ventricular response (HCC) 01/16/2015  . Abdominal pain   . Intractable hiccups   . Renal failure (ARF), acute on chronic (HCC) 01/15/2015  . AKI (acute kidney injury) (HCC)   . Hyperkalemia   . Hypernatremia   . Catheter-associated urinary tract infection (HCC)   . Renal failure 01/14/2015  . Actinic keratosis 10/07/2014  . Bilateral wrist pain 09/09/2014  . Food impaction of esophagus 06/01/2014  . Decreased visual acuity 04/08/2014  . Acute cystitis with positive culture 03/30/2014  . Dysuria 03/18/2014  . Polyuria 03/18/2014  . Loss of weight 03/18/2014  . Callus of foot 12/02/2013  . GERD (gastroesophageal reflux disease) 10/21/2013  . Chest congestion 10/21/2013  . Right lower lobe pneumonia 10/11/2013  . Constipation 10/11/2013  . Type 2 diabetes mellitus (HCC) 09/14/2013  . AAA (abdominal aortic aneurysm) without rupture (HCC) 06/24/2013  . PVD  (peripheral vascular disease) (HCC) 06/24/2013  . Throat discomfort 04/09/2013  . Vertigo 03/18/2013  . Hearing loss of both ears 03/18/2013  . BPH (benign prostatic hyperplasia) 02/16/2013  . Urethral stricture 02/16/2013  . Healthcare maintenance 02/16/2013  . Other malaise and fatigue 07/17/2012  . Pain in joint, pelvic region and thigh 06/18/2012  . Back pain 06/18/2012  . Peripheral vascular disease, unspecified (HCC) 06/18/2012  . Hematuria 06/11/2012  . Neuropathic pain of both legs 05/10/2012  . CVA (cerebral infarction) 01/10/2012  . TIA (transient ischemic attack) 01/08/2012  . Knee pain, bilateral 04/09/2011  . Urinary frequency 04/09/2011  . Carotid artery calcification 07/20/2009  . PVD WITH CLAUDICATION 07/20/2009  . BACK PAIN, LUMBAR, CHRONIC 11/27/2007  . Hyperlipidemia 02/05/2007  . Overweight(278.02) 06/04/2006  . HYPERTENSION, BENIGN SYSTEMIC 03/27/2006  . Coronary atherosclerosis 03/27/2006  . IMPOTENCE, ORGANIC 03/27/2006    Orientation RESPIRATION BLADDER Height & Weight    Self, Time, Situation, Place  Normal Indwelling catheter  (193 cm) 173 lbs.  BEHAVIORAL SYMPTOMS/MOOD NEUROLOGICAL BOWEL NUTRITION STATUS      Continent Diet  AMBULATORY STATUS COMMUNICATION OF NEEDS Skin   Extensive Assist Verbally                         Personal Care Assistance Level of Assistance  Bathing, Dressing Bathing Assistance: Maximum assistance   Dressing Assistance: Maximum assistance     Functional Limitations Info             SPECIAL CARE FACTORS FREQUENCY  PT (By  licensed PT)     PT Frequency: 5/wk              Contractures      Additional Factors Info  Code Status, Allergies, Isolation Precautions Code Status Info: DNR Allergies Info: Novocain     Isolation Precautions Info: MRSA     Current Medications (01/25/2015):  This is the current hospital active medication list Current Facility-Administered Medications  Medication  Dose Route Frequency Provider Last Rate Last Dose  . amLODipine (NORVASC) tablet 10 mg  10 mg Oral Daily Nani Ravens, MD   10 mg at 01/25/15 1053  . aspirin EC tablet 81 mg  81 mg Oral Daily Nani Ravens, MD   Stopped at 01/25/15 1054  . atorvastatin (LIPITOR) tablet 40 mg  40 mg Oral q1800 Nani Ravens, MD   40 mg at 01/24/15 1733  . calcium carbonate (TUMS - dosed in mg elemental calcium) chewable tablet 200 mg of elemental calcium  1 tablet Oral BID PRN Nani Ravens, MD      . dextrose 5 % solution   Intravenous Continuous Bonney Aid, MD 150 mL/hr at 01/25/15 1431    . doxycycline (VIBRA-TABS) tablet 100 mg  100 mg Oral Q12H Nani Ravens, MD   100 mg at 01/25/15 1053  . feeding supplement (ENSURE ENLIVE) (ENSURE ENLIVE) liquid 237 mL  237 mL Oral TID BM Ailene Ards, RD   237 mL at 01/25/15 1600  . HYDROcodone-acetaminophen (NORCO/VICODIN) 5-325 MG per tablet 1-2 tablet  1-2 tablet Oral Q4H PRN Nani Ravens, MD      . pantoprazole (PROTONIX) EC tablet 40 mg  40 mg Oral Daily Nani Ravens, MD   40 mg at 01/25/15 1053  . promethazine (PHENERGAN) injection 12.5 mg  12.5 mg Intravenous Q6H PRN Nani Ravens, MD      . sodium chloride 0.9 % injection 3 mL  3 mL Intravenous Q12H Nani Ravens, MD   3 mL at 01/24/15 1448  . Warfarin - Pharmacist Dosing Inpatient   Does not apply q1800 Herby Abraham, [facility]   Stopped at 01/24/15 1435     Discharge Medications: Please see discharge summary for a list of discharge medications.  Relevant Imaging Results:  Relevant Lab Results:   Additional Information SS#: [id number]  Izora Ribas, [place]

## 2015-01-25 NOTE — Clinical Social Work Note (Signed)
Clinical Social Work Assessment  Patient Details  Name: Brian Burns MRN: [id number] Date of Birth: 07/23/1945  Date of referral:  01/25/15               Reason for consult:  Facility Placement                Permission sought to share information with:  Family Supports Permission granted to share information::  Yes, Verbal Permission Granted  Name::     Gigi Gin and [name]  Agency::  [facility]  Relationship::  wife and Paramedic Information:     Housing/Transportation Living arrangements for the past 2 months:  Single Family Home Source of Information:  Patient Patient Interpreter Needed:  None Criminal Activity/Legal Involvement Pertinent to Current Situation/Hospitalization:  No - Comment as needed Significant Relationships:  Adult Children, Spouse Lives with:  Spouse Do you feel safe going back to the place where you live?  No Need for family participation in patient care:  Yes (Comment) (decision making)  Care giving concerns:  Pt lives at home with wife- wife and dtr concerned about pt returning home given current mobility- wife unable to provide physical assist   Social Worker assessment / plan:  CSW spoke with pt and pt wife and dtr concerning PT recommendation for SNF  Employment status:  Retired Database administrator PT Recommendations:  Skilled Nursing Facility Information / Referral to community resources:  Skilled Nursing Facility  Patient/Family's Response to care:  Pt was not agreeable to SNF initially and wanted to return home- pt communicated primarily non-verbally by nodding.  Pt wife and dtr expressed concerns with pt returning home and encouraged pt to consider SNF- pt now agreeable to option since he is not safe to return home- reiterated with pt and family that if pt improves during hospital stay he may be appropriate to return home.  Patient/Family's Understanding of and Emotional Response to Diagnosis, Current Treatment,  and Prognosis: no questions or concerns at this time.  Emotional Assessment Appearance:  Appears stated age Attitude/Demeanor/Rapport:  Sedated Affect (typically observed):  Appropriate, Quiet Orientation:  Oriented to Self, Oriented to Place, Oriented to  Time, Oriented to Situation Alcohol / Substance use:  Not Applicable Psych involvement (Current and /or in the community):     Discharge Needs  Concerns to be addressed:  Care Coordination Readmission within the last 30 days:  Yes Current discharge risk:  Physical Impairment Barriers to Discharge:  Continued Medical Work up   Izora Ribas, LCSW 01/25/2015, 6:21 PM

## 2015-01-25 NOTE — Evaluation (Signed)
Physical Therapy Evaluation Patient Details Name: Brian Burns MRN: [id number] DOB: 01-Apr-1945 Today's Date: 01/25/2015   History of Present Illness  Brian Burns is a 69 y.o. male presenting with abdominal pain, weight loss. PMH is significant for urethral strictures, CAD, paroxysmal afib, CKD, HTN, CVA x2, BPH, DM 2, AAA.   Clinical Impression  Pt admitted with above. Pt extremely deconditioned and decreased activity tolerance. Pt's HR went into 160s during short ambulation from bed to chair. Pt's HR remained in 160s for about 5 min, once PT reclined pt/elevated his legs, pt's HR returned to the 90s. RN present and aware. Pt to benefit from ST-SNF to achieve maximal functional recovery for safe return home. If patient can progress to supervision level of function pt may be able to return home with assist of family.    Follow Up Recommendations SNF;Supervision/Assistance - 24 hour    Equipment Recommendations       Recommendations for Other Services       Precautions / Restrictions Precautions Precautions: Fall Restrictions Weight Bearing Restrictions: No      Mobility  Bed Mobility Overal bed mobility: Needs Assistance Bed Mobility: Sidelying to Sit   Sidelying to sit: Supervision       General bed mobility comments: increased time, labored effort, directional v/c's  Transfers Overall transfer level: Needs assistance Equipment used: Rolling walker (2 wheeled) Transfers: Sit to/from Stand Sit to Stand: Min assist         General transfer comment: v/c's for hand placement, bed elevated, assist to initially clear bottom  Ambulation/Gait Ambulation/Gait assistance: Min assist Ambulation Distance (Feet): 6 Feet Assistive device: Rolling walker (2 wheeled) Gait Pattern/deviations: Step-through pattern Gait velocity: decreased Gait velocity interpretation: <1.8 ft/sec, indicative of risk for recurrent falls General Gait Details: slow to step, increased trunk  flexion, narrow base of support with occasional cross over, minA for walker management, directional v/c's, HR into 160s  Stairs            Wheelchair Mobility    Modified Rankin (Stroke Patients Only)       Balance Overall balance assessment: Needs assistance Sitting-balance support: Feet supported;Bilateral upper extremity supported Sitting balance-Leahy Scale: Fair     Standing balance support: Bilateral upper extremity supported Standing balance-Leahy Scale: Poor Standing balance comment: needs rolling walker for safe standing                             Pertinent Vitals/Pain Pain Assessment: No/denies pain    Home Living Family/patient expects to be discharged to:: Private residence Living Arrangements: Spouse/significant other Available Help at Discharge: Family;Available 24 hours/day Type of Home: House Home Access: Stairs to enter Entrance Stairs-Rails: Doctor, general practice of Steps: 5 Home Layout: One level Home Equipment: Walker - 2 wheels;Cane - single point;Grab bars - tub/shower      Prior Function Level of Independence: Independent with assistive device(s)         Comments: pt reports he was using a RW     Hand Dominance   Dominant Hand: Right    Extremity/Trunk Assessment   Upper Extremity Assessment: Generalized weakness           Lower Extremity Assessment: Generalized weakness      Cervical / Trunk Assessment: Normal  Communication   Communication: No difficulties  Cognition Arousal/Alertness: Awake/alert Behavior During Therapy: WFL for tasks assessed/performed Overall Cognitive Status: Within Functional Limits for tasks assessed  General Comments General comments (skin integrity, edema, etc.): HR up into 160 during amb to chair    Exercises        Assessment/Plan    PT Assessment Patient needs continued PT services  PT Diagnosis Difficulty walking;Abnormality  of gait;Generalized weakness   PT Problem List Decreased strength;Decreased activity tolerance;Decreased balance;Decreased mobility;Decreased coordination;Decreased knowledge of use of DME  PT Treatment Interventions DME instruction;Gait training;Stair training;Functional mobility training;Therapeutic activities;Therapeutic exercise;Balance training;Neuromuscular re-education;Patient/family education   PT Goals (Current goals can be found in the Care Plan section) Acute Rehab PT Goals PT Goal Formulation: With patient Time For Goal Achievement: 02/01/15 Potential to Achieve Goals: Good    Frequency Min 3X/week   Barriers to discharge        Co-evaluation               End of Session Equipment Utilized During Treatment: Gait belt Activity Tolerance: Patient limited by fatigue Patient left: in chair;with call bell/phone within reach (legs elevated) Nurse Communication: Mobility status         Time: 8309-4076 PT Time Calculation (min) (ACUTE ONLY): 32 min   Charges:   PT Evaluation $Initial PT Evaluation Tier I: 1 Procedure PT Treatments $Gait Training: 8-22 mins   PT G CodesMarcene Brawn 01/25/2015, 1:38 PM   Lewis Shock, PT, DPT Pager #: [phone] Office #: [phone]

## 2015-01-25 NOTE — Clinical Social Work Placement (Signed)
   CLINICAL SOCIAL WORK PLACEMENT  NOTE  Date:  01/25/2015  Patient Details  Name: LAYMAN ALTIER MRN: [id number] Date of Birth: 25-May-1945  Clinical Social Work is seeking post-discharge placement for this patient at the Skilled  Nursing Facility level of care (*CSW will initial, date and re-position this form in  chart as items are completed):  Yes   Patient/family provided with [place] Clinical Social Work Department's list of facilities offering this level of care within the geographic area requested by the patient (or if unable, by the patient's family).  Yes   Patient/family informed of their freedom to choose among providers that offer the needed level of care, that participate in Medicare, Medicaid or managed care program needed by the patient, have an available bed and are willing to accept the patient.  Yes   Patient/family informed of [place]'s ownership interest in [facility] and [facility], as well as of the fact that they are under no obligation to receive care at these facilities.  PASRR submitted to EDS on 01/25/15     PASRR number received on 01/25/15     Existing PASRR number confirmed on       FL2 transmitted to all facilities in geographic area requested by pt/family on 01/25/15     FL2 transmitted to all facilities within larger geographic area on       Patient informed that his/her managed care company has contracts with or will negotiate with certain facilities, including the following:            Patient/family informed of bed offers received.  Patient chooses bed at       Physician recommends and patient chooses bed at      Patient to be transferred to   on  .  Patient to be transferred to facility by       Patient family notified on   of transfer.  Name of family member notified:        PHYSICIAN Please sign FL2, Please sign DNR     Additional Comment:    _______________________________________________ Izora Ribas,  LCSW 01/25/2015, 6:23 PM

## 2015-01-25 NOTE — Progress Notes (Addendum)
Initial Nutrition Assessment  DOCUMENTATION CODES:   Non-severe (moderate) malnutrition in context of chronic illness  INTERVENTION:   Ensure Enlive po TID, each supplement provides 350 kcal and 20 grams of protein  NUTRITION DIAGNOSIS:   Increased nutrient needs related to chronic illness as evidenced by estimated needs   GOAL:   Patient will meet greater than or equal to 90% of their needs, Weight gain  MONITOR:   PO intake, Supplement acceptance, Labs, Weight trends, I & O's  REASON FOR ASSESSMENT:   Consult  (Weight loss)  ASSESSMENT:   69 yo Male with PMH significant for urethral strictures, CAD, paroxysmal afib, CKD, HTN, CVA x2, BPH, DM 2, AAA; presented with abdominal pain and weight loss.   Noted concern for potential malignancy vs benign GI pathology vs psychosocial causes.  Patient reports a poor appetite for 2 weeks.  He states he hasn't eaten anything in this time frame.  He endorses weight loss, however, he is unable to quantify amount.  He also does not know his usual body weight.  Would benefit from addition of oral nutrition supplements.  RD to order.  Nutrition-Focused physical exam completed. Findings are moderate fat depletion, moderate muscle depletion, and no edema.   Diet Order:  Diet Heart Room service appropriate?: Yes; Fluid consistency:: Thin  Skin:  Reviewed, no issues  Last BM:  N/A  Height:   Ht Readings from Last 1 Encounters:  01/24/15 6\' 4"  (1.93 m)    Weight:   Wt Readings from Last 1 Encounters:  01/24/15 173 lb 14.4 oz (78.881 kg)    Ideal Body Weight:  91.8 kg  BMI:  Body mass index is 21.18 kg/(m^2).  Estimated Nutritional Needs:   Kcal:  2100-2300  Protein:  110-120 gm  Fluid:  2.1-2.3 L  EDUCATION NEEDS:   No education needs identified at this time  Maureen Chatters, RD, LDN Pager #: [phone] After-Hours Pager #: [phone]

## 2015-01-25 NOTE — Progress Notes (Signed)
ANTICOAGULATION CONSULT NOTE - Follow Up Consult  Pharmacy Consult for Coumadin Indication: atrial fibrillation  Allergies  Allergen Reactions  . Novocain [Procaine Hcl] Nausea And Vomiting    Patient Measurements: Height: 6\' 4"  (193 cm) Weight: 173 lb 14.4 oz (78.881 kg) IBW/kg (Calculated) : 86.8 Heparin Dosing Weight:   Vital Signs: Temp: 98.7 F (37.1 C) (12/28 1424) Temp Source: Oral (12/28 1424) BP: 126/87 mmHg (12/28 1424) Pulse Rate: 80 (12/28 1424)  Labs:  Recent Labs  01/22/15 1610 01/22/15 1721 01/24/15 1029 01/24/15 1329  01/25/15 0351 01/25/15 1030 01/25/15 1403  HGB 14.2  --  14.6  16.0  --   --  13.3  --   --   HCT 43.5  --  45.2  47.0  --   --  42.6  --   --   PLT 247  --  183  --   --  170  --   --   LABPROT  --  40.6*  --  51.2*  --  49.0*  --   --   INR  --  4.37*  --  5.94*  --  5.59*  --   --   CREATININE 3.66*  --  4.12*  3.30*  --   < > 3.73* 3.71* 3.41*  < > = values in this interval not displayed.  Estimated Creatinine Clearance: 23.5 mL/min (by C-G formula based on Cr of 3.41).   Medications:  Scheduled:  . amLODipine  10 mg Oral Daily  . aspirin EC  81 mg Oral Daily  . atorvastatin  40 mg Oral q1800  . doxycycline  100 mg Oral Q12H  . feeding supplement (ENSURE ENLIVE)  237 mL Oral TID BM  . pantoprazole  40 mg Oral Daily  . sodium chloride  3 mL Intravenous Q12H  . Warfarin - Pharmacist Dosing Inpatient   Does not apply q1800    Assessment: 69yo male with poor po intake pta and wt loss.  INR elevated & has continued to increase this admission, though finally down a bit today.  His last Coumadin dose was 12/24.  Hg and pltc wnl.  Poor po intake has perhaps caused a deficiency in Vit K which may be contributing to elevated INR.  Goal of Therapy:  INR 2-3 Monitor platelets by anticoagulation protocol: Yes   Plan:  No Coumadin today Watch for s/s of bleeding Continue daily INR  Oshen Wlodarczyk P 01/25/2015,4:01 PM

## 2015-01-25 NOTE — Progress Notes (Signed)
Family Medicine Teaching Service Daily Progress Note Intern Pager: [phone]  Patient name: Brian Burns Medical record number: [id number] Date of birth: July 09, 1945 Age: 69 y.o. Gender: male  Primary Care Provider: Lavon Paganini, MD Consultants: Nephrology, GI Code Status: DNR  Pt Overview and Major Events to Date:  12/27: admit for abdominal pain  Assessment and Plan:  Dehydration/Nausea/Vomiting: Resolved.  - telemetry - D5W 150 cc/hr - phenergan 12.49m IV q6hrs for nausea - diet as tolerated  Weight loss: He has lost considerable weight over the last year and is due for colonoscopy. Last colonoscopy was in 2004, with flex sigmoidoscopy in 2011. Given weight loss in setting of not being up to date on this cancer screening, concern for potential malignancy versus benign GI pathology vs psychosocial causes . He has normal Hct - Will obtain TSH, ESR, CRP - will get nutrition consult  - PT/OT - Will consult GI for help determining cause of weight loss   AKI on CKD3: improving Cr 4.12>> 3.73. Prior to last discharge 3.3 (was up to 7 on that admission). Baseline before past month seems to be around 1.3. Nephrology was previously consulted for this and agreed with post obstruction causing prior AKI.  - fluids as above - renal function does not continue to improve consider Renal consult - will need urology follow up as an outpatient  Electrolytes- Na 154<< 156, K 5.5. Pt had similar electrolyte disturbances associated with urinary obstruction - Fluids as above - BMP q6  BPH: apparently hasn't been taking finasteride or flomax - continue foley - monitor UOP  Prior UTI: was on doxycycline for UTI treatment from last hospitalization. Apparently was taking only once a day. End date is 12/28 - continue doxycycline through 12/28  Paroxysmal afib: in NSR currently, anticoagulated with warfarin, INR supra-therapeutic to 5.5 12/28. INR potentially elevated in setting of decreased PO  intake and potential vitamin K deficiency. Renal impairment also potentially contributing as well (1) - follow INR - warfarin per pharm, but consider alternative anticoagulation like Eliquis  Left lung nodule: extensive smoking history - repeat CT scan in 1 year   FEN/GI: heart healthy / D5W Prophylaxis: warfarin  Disposition: Pending clinical improvement  Subjective:  Denies N/V/D, denies SOB, chest pain, abd pain  Objective: Temp:  [97.7 F (36.5 C)-98.8 F (37.1 C)] 98.4 F (36.9 C) (12/28 0410) Pulse Rate:  [84-103] 84 (12/28 0410) Resp:  [9-31] 18 (12/28 0410) BP: (125-165)/(101-124) 146/112 mmHg (12/28 0410) SpO2:  [95 %-100 %] 100 % (12/28 0410) Weight:  [173 lb 14.4 oz (78.881 kg)-175 lb (79.379 kg)] 173 lb 14.4 oz (78.881 kg) (12/27 1315) Physical Exam: General: NAD, lying in bed Cardiovascular: RRR Respiratory: CTAB Abdomen: soft, non tender Extremities: no LE edema or tenderness  Laboratory:  Recent Labs Lab 01/22/15 1610 01/24/15 1029 01/25/15 0351  WBC 12.3* 12.3* 12.8*  HGB 14.2 14.6  16.0 13.3  HCT 43.5 45.2  47.0 42.6  PLT 247 183 170    Recent Labs Lab 01/22/15 1610 01/24/15 1029 01/24/15 1808 01/25/15 0351  NA 148* 155*  156* 154* 154*  K 5.0 5.5*  5.3* 5.3* 5.5*  CL 116* 122*  125* 123* 122*  CO2 22 20* 19* 20*  BUN 74* 88*  80* 85* 83*  CREATININE 3.66* 4.12*  3.30* 3.78* 3.73*  CALCIUM 9.4 9.5 9.1 9.0  PROT 9.1*  --   --   --   BILITOT 0.5  --   --   --  ALKPHOS 140*  --   --   --   ALT 38  --   --   --   AST 35  --   --   --   GLUCOSE 148* 150*  144* 128* 124*     Veatrice Bourbon, MD 01/25/2015, 8:17 AM PGY-2, [place] Intern pager: [phone], text pages welcome  References   1. Limdi NA, Limdi MA, Karn Pickler AM, Crowley MR, [place] MF, Allon M, Beasley TM . Warfarin dosing in patients with impaired kidney function. Am J Kidney Dis. 2010;56(5):823.

## 2015-01-25 NOTE — Progress Notes (Signed)
CRITICAL VALUE ALERT  Critical value received:  INR 5.59  Date of notification:  01/25/2015  Time of notification:  0635  Critical value read back: Yes  Nurse who received alert:  Toya Smothers, RN  MD notified (1st page):  Dr. Leveda Anna  Time of first page:  0636 MD notified (2nd page):  Time of second page:  Responding MD:  Dr. Leveda Anna  Time MD responded:  [phone]

## 2015-01-26 ENCOUNTER — Encounter (HOSPITAL_COMMUNITY): Payer: Self-pay | Admitting: General Practice

## 2015-01-26 DIAGNOSIS — E440 Moderate protein-calorie malnutrition: Secondary | ICD-10-CM

## 2015-01-26 LAB — BASIC METABOLIC PANEL
ANION GAP: 7 (ref 5–15)
Anion gap: 8 (ref 5–15)
Anion gap: 9 (ref 5–15)
BUN: 66 mg/dL — ABNORMAL HIGH (ref 6–20)
BUN: 62 mg/dL — AB (ref 6–20)
BUN: 54 mg/dL — ABNORMAL HIGH (ref 6–20)
CALCIUM: 8.6 mg/dL — AB (ref 8.9–10.3)
CALCIUM: 8.5 mg/dL — AB (ref 8.9–10.3)
CO2: 20 mmol/L — AB (ref 22–32)
CO2: 19 mmol/L — ABNORMAL LOW (ref 22–32)
CO2: 21 mmol/L — ABNORMAL LOW (ref 22–32)
CREATININE: 3.08 mg/dL — AB (ref 0.61–1.24)
Calcium: 8.6 mg/dL — ABNORMAL LOW (ref 8.9–10.3)
Chloride: 121 mmol/L — ABNORMAL HIGH (ref 101–111)
Chloride: 119 mmol/L — ABNORMAL HIGH (ref 101–111)
Chloride: 114 mmol/L — ABNORMAL HIGH (ref 101–111)
Creatinine, Ser: 3.17 mg/dL — ABNORMAL HIGH (ref 0.61–1.24)
Creatinine, Ser: 2.92 mg/dL — ABNORMAL HIGH (ref 0.61–1.24)
GFR calc Af Amer: 23 mL/min — ABNORMAL LOW (ref >60)
GFR calc Af Amer: 24 mL/min — ABNORMAL LOW (ref >60)
GFR, EST AFRICAN AMERICAN: 22 mL/min — AB (ref >60)
GFR, EST NON AFRICAN AMERICAN: 19 mL/min — AB (ref >60)
GFR, EST NON AFRICAN AMERICAN: 19 mL/min — AB (ref >60)
GFR, EST NON AFRICAN AMERICAN: 21 mL/min — AB (ref >60)
Glucose, Bld: 164 mg/dL — ABNORMAL HIGH (ref 65–99)
Glucose, Bld: 150 mg/dL — ABNORMAL HIGH (ref 65–99)
Glucose, Bld: 135 mg/dL — ABNORMAL HIGH (ref 65–99)
POTASSIUM: 4.5 mmol/L (ref 3.5–5.1)
POTASSIUM: 4.4 mmol/L (ref 3.5–5.1)
Potassium: 4.1 mmol/L (ref 3.5–5.1)
SODIUM: 146 mmol/L — AB (ref 135–145)
SODIUM: 144 mmol/L (ref 135–145)
Sodium: 148 mmol/L — ABNORMAL HIGH (ref 135–145)

## 2015-01-26 LAB — PROTIME-INR
INR: 5.63 — AB (ref 0.00–1.49)
Prothrombin Time: 49.1 seconds — ABNORMAL HIGH (ref 11.6–15.2)

## 2015-01-26 MED ORDER — PHYTONADIONE 5 MG PO TABS
5.0000 mg | ORAL_TABLET | Freq: Once | ORAL | Status: AC
Start: 2015-01-26 — End: 2015-01-26
  Administered 2015-01-26: 5 mg via ORAL
  Filled 2015-01-26 (×2): qty 1

## 2015-01-26 MED ORDER — MIRTAZAPINE 15 MG PO TABS
15.0000 mg | ORAL_TABLET | Freq: Every day | ORAL | Status: DC
  Administered 2015-01-26 – 2015-02-10 (×9): 15 mg via ORAL
  Filled 2015-01-26 (×19): qty 1

## 2015-01-26 NOTE — Consult Note (Signed)
[name] Gastroenterology Consultation Note  Referring Provider: Dr. Doralee Albino (Teaching Service) Primary Care Physician:  Shirlee Latch, MD Primary Gastroenterologist:  Dr. Carman Ching  Reason for Consultation:  Weight loss  HPI: Brian Burns is a 69 y.o. male whom we've been asked to see for evaluation of weight loss.  Patient is unable to provide a clear history, but as best I can tell, he has had several months of intermittent post-prandial nausea and vomiting, worse over the past couple weeks.  He has also had poor appetite and doesn't want to eat.  Has lost about 30 lbs.  No bowel movement in a few days.   He denies dysphagia at this time.  He had food impaction, esophagus full of pork chops, after eating voluminously despite lack of teeth, had endoscopy several months ago with Dr. [name] for food extraction; no underlying esophageal stricture or other abnormality was seen on endoscopy.  Last colonoscopy about 10 years ago.  Current work-up has included CT chest, abdomen, and pelvis which, among other findings, showed pancreatic calcifications; no obvious GI tract mass was seen.   Past Medical History  Diagnosis Date  . Hypertension   . HLD (hyperlipidemia)   . Erectile dysfunction   . Chronic back pain   . CAD (coronary artery disease)   . GERD (gastroesophageal reflux disease)   . Arthritis     knees,lower back  . Carotid artery disease (HCC)   . PVD (peripheral vascular disease) with claudication (HCC)   . AAA (abdominal aortic aneurysm) without rupture (HCC) 06/2014    no change in last exam from 2015  . History of CVA (cerebrovascular accident)     2009  &  2013  . Type 2 diabetes mellitus (HCC)     no meds per pt  . Atrial flutter with rapid ventricular response (HCC) 01/16/2015  . Atrial fibrillation (HCC)   . Idiopathic pancreatitis 12/2014    acute    Past Surgical History  Procedure Laterality Date  . Esophagogastroduodenoscopy N/A 05/15/2014   Procedure: ESOPHAGOGASTRODUODENOSCOPY (EGD);  Surgeon: Carman Ching, MD;  Location: [facility] ENDOSCOPY;  Service: Endoscopy;  Laterality: N/A;  . Cataract extraction w/ intraocular lens  implant, bilateral  april and may 2014  . Transthoracic echocardiogram  01-07-2012    mild to moderate dilated LV,  ef 45-50%,  mild basal hypokinesis,  grade I diastolic  dysfunction/  trivial AR/  mild MR  . Cardiovascular stress test  08-09-2009    mild global hypokinesis,  no ischemia or scar/  ef 41%  . Cystoscopy with urethral dilatation N/A 11/24/2014    Procedure: CYSTOSCOPY WITH URETHRAL DILATATION;  Surgeon: Crist Fat, MD;  Location: [facility];  Service: Urology;  Laterality: N/A;  BALLOON DILATION        . Cystoscopy with retrograde urethrogram N/A 11/24/2014    Procedure: CYSTOSCOPY WITH RETROGRADE URETHROGRAM;  Surgeon: Crist Fat, MD;  Location: [facility];  Service: Urology;  Laterality: N/A;    Prior to Admission medications   Medication Sig Start Date End Date Taking? Authorizing Provider  amLODipine (NORVASC) 10 MG tablet Take 1 tablet (10 mg total) by mouth daily. 02/24/14  Yes Stephanie Coup Street, MD  aspirin EC 81 MG tablet Take 81 mg by mouth daily.   Yes Historical Provider, MD  atorvastatin (LIPITOR) 40 MG tablet Take 1 tablet (40 mg total) by mouth daily. 09/27/14  Yes Erasmo Downer, MD  doxycycline (VIBRA-TABS) 100 MG tablet  Take 1 tablet (100 mg total) by mouth every 12 (twelve) hours. Patient not taking: Reported on 01/24/2015 01/20/15   Joanna Puff, MD  finasteride (PROSCAR) 5 MG tablet Take 1 tablet (5 mg total) by mouth daily. Patient not taking: Reported on 01/22/2015 04/26/13   Stephanie Coup Street, MD  gabapentin (NEURONTIN) 100 MG capsule Take 1 capsule (100 mg total) by mouth 3 (three) times daily. Patient not taking: Reported on 01/14/2015 07/23/14   Stephanie Coup Street, MD  HYDROcodone-acetaminophen [facility]) 5-325  MG tablet Take 1-2 tablets by mouth every 4 (four) hours as needed for severe pain. Patient not taking: Reported on 01/24/2015 01/22/15   Loren Racer, MD  meclizine (ANTIVERT) 25 MG tablet Take 1 tablet (25 mg total) by mouth 3 (three) times daily as needed for dizziness. Patient not taking: Reported on 01/24/2015 10/14/14   Federico Flake, MD  metFORMIN (GLUCOPHAGE) 500 MG tablet Take 1 tablet (500 mg total) by mouth daily. Patient not taking: Reported on 01/14/2015 09/14/13   Myra Rude, MD  omeprazole (PRILOSEC) 20 MG capsule Take 1 capsule (20 mg total) by mouth daily. Patient not taking: Reported on 01/24/2015 01/22/15   Loren Racer, MD  ondansetron (ZOFRAN) 4 MG tablet Take 1 tablet (4 mg total) by mouth every 6 (six) hours as needed for nausea or vomiting. Patient not taking: Reported on 01/24/2015 01/22/15   Loren Racer, MD  phenazopyridine (PYRIDIUM) 200 MG tablet Take 1 tablet (200 mg total) by mouth 3 (three) times daily as needed for pain. Patient not taking: Reported on 01/14/2015 11/24/14   Crist Fat, MD  sucralfate (CARAFATE) 1 G tablet Take 1 tablet (1 g total) by mouth 3 (three) times daily as needed. Patient not taking: Reported on 01/24/2015 01/22/15   Loren Racer, MD  tamsulosin (FLOMAX) 0.4 MG CAPS capsule Take 1 capsule (0.4 mg total) by mouth daily. Patient not taking: Reported on 01/14/2015 04/07/14   Stephanie Coup Street, MD  warfarin (COUMADIN) 2.5 MG tablet Take 1 tablet (2.5 mg total) by mouth daily. Starting 12/24, then as directed from INR check on 12/27 Patient not taking: Reported on 01/24/2015 01/20/15   Joanna Puff, MD    Current Facility-Administered Medications  Medication Dose Route Frequency Provider Last Rate Last Dose  . amLODipine (NORVASC) tablet 10 mg  10 mg Oral Daily Nani Ravens, MD   10 mg at 01/26/15 1012  . aspirin EC tablet 81 mg  81 mg Oral Daily Nani Ravens, MD   81 mg at 01/26/15 1012  .  atorvastatin (LIPITOR) tablet 40 mg  40 mg Oral q1800 Nani Ravens, MD   40 mg at 01/25/15 1828  . calcium carbonate (TUMS - dosed in mg elemental calcium) chewable tablet 200 mg of elemental calcium  1 tablet Oral BID PRN Nani Ravens, MD      . dextrose 5 % solution   Intravenous Continuous Bonney Aid, MD 150 mL/hr at 01/26/15 1019    . feeding supplement (ENSURE ENLIVE) (ENSURE ENLIVE) liquid 237 mL  237 mL Oral TID BM Ailene Ards, RD   237 mL at 01/25/15 1600  . HYDROcodone-acetaminophen (NORCO/VICODIN) 5-325 MG per tablet 1-2 tablet  1-2 tablet Oral Q4H PRN Nani Ravens, MD      . pantoprazole (PROTONIX) EC tablet 40 mg  40 mg Oral Daily Nani Ravens, MD   40 mg at 01/26/15 1012  . phytonadione (VITAMIN K) tablet 5 mg  5 mg Oral Once Ardith Dark, MD      . promethazine [facility]) injection 12.5 mg  12.5 mg Intravenous Q6H PRN Nani Ravens, MD      . sodium chloride 0.9 % injection 3 mL  3 mL Intravenous Q12H Nani Ravens, MD   3 mL at 01/25/15 2200  . Warfarin - Pharmacist Dosing Inpatient   Does not apply q1800 Herby Abraham, [facility]   Stopped at 01/24/15 1435    Allergies as of 01/24/2015 - Review Complete 01/24/2015  Allergen Reaction Noted  . Novocain [procaine hcl] Nausea And Vomiting 01/01/2012    Family History  Problem Relation Age of Onset  . Lung disease Brother   . Kidney disease Brother   . Heart disease Brother     died at 28  . Heart disease Mother   . Kidney disease Mother   . Lung disease Mother     Social History   Social History  . Marital Status: Married    Spouse Name: N/A  . Number of Children: 2   . Years of Education: N/A   Occupational History  .  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 [employer]     on the weekends   Social History Main Topics  . Smoking status: Former Smoker -- 0.50 packs/day for 40 years    Types: Cigarettes    Quit date: 01/29/2004  . Smokeless tobacco: Never Used  . Alcohol Use: No   . Drug Use: No  . Sexual Activity: Not Currently   Other Topics Concern  . Not on file   Social History Narrative    Review of Systems: Positive = bold Gen: Denies any fever, chills, rigors, night sweats, anorexia, fatigue, weakness, malaise, involuntary weight loss, and sleep disorder CV: Denies chest pain, angina, palpitations, syncope, orthopnea, PND, peripheral edema, and claudication. Resp: Denies dyspnea, cough, sputum, wheezing, coughing up blood. GI: Described in detail in HPI.    GU : Denies urinary burning, blood in urine, urinary frequency, urinary hesitancy, nocturnal urination, and urinary incontinence. MS: Denies joint pain or swelling.  Denies muscle weakness, cramps, atrophy.  Derm: Denies rash, itching, oral ulcerations, hives, unhealing ulcers.  Psych: Denies depression, anxiety, memory loss, suicidal ideation, hallucinations,  and confusion. Heme: Denies bruising, bleeding, and enlarged lymph nodes. Neuro:  Denies any headaches, dizziness, paresthesias. Endo:  Denies any problems with DM, thyroid, adrenal function.  Physical Exam: Vital signs in last 24 hours: Temp:  [98 F (36.7 C)-98.7 F (37.1 C)] 98.2 F (36.8 C) (12/29 1011) Pulse Rate:  [65-81] 65 (12/29 1011) Resp:  [17-18] 18 (12/29 0445) BP: (126-151)/(87-99) 149/99 mmHg (12/29 1011) SpO2:  [99 %-100 %] 100 % (12/29 1011) Last BM Date: 01/13/15 General:   Alert,  Somewhat cachectic-appearing, but is in NAD Head:  Normocephalic and atraumatic. Eyes:  Sclera clear, no icterus.   Conjunctiva pink. Ears:  Normal auditory acuity. Nose:  No deformity, discharge,  or lesions. Mouth:  Edentulous.  No deformity or lesions.  Oropharynx pink & moist. Neck:  Supple; no masses or thyromegaly. Lungs:  Clear throughout to auscultation.   No wheezes, crackles, or rhonchi. No acute distress. Heart:  Regular rate and rhythm; no murmurs, clicks, rubs,  or gallops. Abdomen:  Soft, nontender and nondistended. No  masses, hepatosplenomegaly or hernias noted. Normal bowel sounds, without guarding, and without rebound.     Msk:  Symmetrical without gross deformities. Normal posture. Pulses:  Normal pulses noted. Extremities:  Without clubbing or edema. Neurologic:  Alert and  oriented x4;  Diffusely weak, otherwise grossly normal neurologically. Skin:  Intact without significant lesions or rashes. Psych:  Alert and cooperative. Normal mood and affect.   Lab Results:  Recent Labs  01/24/15 1029 01/25/15 0351  WBC 12.3* 12.8*  HGB 14.6  16.0 13.3  HCT 45.2  47.0 42.6  PLT 183 170   BMET  Recent Labs  01/25/15 2019 01/26/15 0230 01/26/15 0819  NA 150* 148* 146*  K 4.9 4.5 4.4  CL 120* 121* 119*  CO2 20* 20* 19*  GLUCOSE 158* 164* 150*  BUN 71* 66* 62*  CREATININE 3.28* 3.17* 3.08*  CALCIUM 8.7* 8.6* 8.6*   LFT No results for input(s): PROT, ALBUMIN, AST, ALT, ALKPHOS, BILITOT, BILIDIR, IBILI in the last 72 hours. PT/INR  Recent Labs  01/25/15 0351 01/26/15 0230  LABPROT 49.0* 49.1*  INR 5.59* 5.63*    Studies/Results: No results found.  Impression:  1.  Weight loss. Problem seems most consistent with poor appetite rather than intrinsic GI tract anomaly.  CT chest/abd/pelvis showed no obvious malignancy and patient is not anemic; as such, doubt GI luminal tract malignancy.  Worsening renal insufficiency (uremia) may be contributing, or may just be a result of prerenal azotemia. 2.  Occasional nausea/vomiting. 3.  Chronic calcific pancreatitis.  Doubt source of weight loss.  Plan:  1.  I personally watched patient drink liquid today; no dysphagia or sialorrhea; he simply tells me "food has no taste now." 2.  I have encouraged him to find food on the dietician's list that he would like to try. 3.  I told patient that if he continues not to eat and not try to eat, then we would have do repeat endoscopy versus esophagram and he may very well then need Interventional  Radiology consultation for PEG tube placement.  If this is ultimately done (and this has NOT been yet decided), it would require that he stop warfarin and have INR </= 1.8.   4.  PPI and antiemetics for supportive symptom-based management. 5.  I don't see role for any further GI testing at the present time. 6.  [facility] will follow.   LOS: 2 days   Jahnessa Vanduyn M  01/26/2015, 12:39 PM  Pager [phone] If no answer or after 5 PM call [phone]

## 2015-01-26 NOTE — Progress Notes (Signed)
CSW met with patient, wife and daughter Juliann Pulse this afternoon and discussed bed search for short term SNF.Marland Kitchen  They do not have a particular preference but daughter stated that she had heard of [facility] and they would be satisfied with bed offer there.  CSW notified admissions at [facility] and she is reviewing the referral and will respond in the morning. Daughter indicated that they would like to hear all bed offers and then decide in the morning.  Per discuss with unit RNCM-  Question possible stability tomorrow. Will await decision from MD in the morning.  [employer] authorization will be required prior to d/c to SNF. CSW will monitor.  Lorie Phenix. Pauline Good, [place]

## 2015-01-26 NOTE — Progress Notes (Signed)
CRITICAL VALUE ALERT  Critical value received:  Yes, INR 5.2 (Anticipated Result)  Date of notification:  01/26/2015  Time of notification:  310  Critical value read back:Yes  Nurse who received alert:  Merrit Waugh P. Orson Slick BSN, RN  MD notified (1st page):  Family Medicine Intern   Time of first page:  328  MD notified (2nd page):  Time of second page:  Responding MD:    Time MD responded:

## 2015-01-26 NOTE — Progress Notes (Signed)
RN updated the best of knowledge to family at bedside. Family requests to speak with MD. MD paged.    Sim Boast, RN

## 2015-01-26 NOTE — Progress Notes (Signed)
Family Medicine Teaching Service Daily Progress Note Intern Pager: [phone]  Patient name: Brian Burns Medical record number: [id number] Date of birth: 04/16/1945 Age: 69 y.o. Gender: male  Primary Care Provider: Lavon Paganini, MD Consultants: Nephrology, GI Code Status: DNR  Pt Overview and Major Events to Date:  12/27: admit for abdominal pain  Assessment and Plan: Brian Burns is a 69 y.o. male presenting with abdominal pain, weight loss. PMH is significant for urethral strictures, CAD, paroxysmal afib, CKD, HTN, CVA x2, BPH, DM 2, AAA.   Weight loss: He has lost considerable weight over the last year and is due for colonoscopy. Last colonoscopy was in 2004, with flex sigmoidoscopy in 2011. Given weight loss in setting of not being up to date on this cancer screening, concern for potential malignancy versus benign GI pathology vs psychosocial causes . He has normal Hct - TSH, ESR, and CRP normal - Nutrition consulted, appreciate assistance - PT/OT recommend SNF placement - Will consult GI for help determining cause of weight loss   Dehydration/Nausea/Vomiting: Resolved.  - telemetry - D5W 150 cc/hr - phenergan 12.58m IV q6hrs for nausea - diet as tolerated  AKI on CKD3: improving. Prior to last discharge 3.3 (was up to 7 on that admission). Baseline before past month seems to be around 1.3. Nephrology was previously consulted for this and agreed with post obstruction causing prior AKI.  - fluids as above - will need urology follow up as an outpatient  BPH: apparently hasn't been taking finasteride or flomax - continue foley - monitor UOP  Hypernatremia, hyperchloremia.  Now improving. Pt had similar electrolyte disturbances associated with urinary obstruction - Fluids as above - BMP q6   Paroxysmal afib: in NSR currently, anticoagulated with warfarin, INR supra-therapeutic to 5.5 12/28. INR potentially elevated in setting of decreased PO intake and potential  vitamin K deficiency. Renal impairment also potentially contributing as well. - Will give 563mof vitamin K to reverse any vitamin K deficiency - follow INR - warfarin per pharm, but consider alternative anticoagulation like Eliquis  Left lung nodule: extensive smoking history - repeat CT scan in 1 year   FEN/GI: heart healthy / D5W Prophylaxis: warfarin  Disposition: To SNF pending above management.   Subjective:  Feels cold this morning. Otherwise no complaints. No N/V/D.   Objective: Temp:  [98 F (36.7 C)-98.7 F (37.1 C)] 98 F (36.7 C) (12/29 0445) Pulse Rate:  [73-81] 81 (12/29 0445) Resp:  [17-18] 18 (12/29 0445) BP: (126-151)/(87-96) 151/93 mmHg (12/29 0445) SpO2:  [99 %-100 %] 100 % (12/29 0445) Physical Exam: General: NAD, lying in bed Cardiovascular: RRR, no murmurs Respiratory: CTAB, no wheezes Abdomen: soft, non tender Extremities: no LE edema or tenderness  Laboratory:  Recent Labs Lab 01/22/15 1610 01/24/15 1029 01/25/15 0351  WBC 12.3* 12.3* 12.8*  HGB 14.2 14.6  16.0 13.3  HCT 43.5 45.2  47.0 42.6  PLT 247 183 170    Recent Labs Lab 01/22/15 1610  01/25/15 1403 01/25/15 2019 01/26/15 0230  NA 148*  < > 152* 150* 148*  K 5.0  < > 5.2* 4.9 4.5  CL 116*  < > 126* 120* 121*  CO2 22  < > 17* 20* 20*  BUN 74*  < > 78* 71* 66*  CREATININE 3.66*  < > 3.41* 3.28* 3.17*  CALCIUM 9.4  < > 8.8* 8.7* 8.6*  PROT 9.1*  --   --   --   --   BILITOT 0.5  --   --   --   --  ALKPHOS 140*  --   --   --   --   ALT 38  --   --   --   --   AST 35  --   --   --   --   GLUCOSE 148*  < > 149* 158* 164*  < > = values in this interval not displayed.  ESR 11 CRP 0.5 TSH 1.682  Brian Barrack, MD 01/26/2015, 8:38 AM PGY-2, [place] Intern pager: [phone], text pages welcome

## 2015-01-26 NOTE — Progress Notes (Signed)
ANTICOAGULATION CONSULT NOTE - Follow Up Consult  Pharmacy Consult for Coumadin Indication: atrial fibrillation  Allergies  Allergen Reactions  . Novocain [Procaine Hcl] Nausea And Vomiting    Patient Measurements: Height: 6\' 4"  (193 cm) Weight: 173 lb 14.4 oz (78.881 kg) IBW/kg (Calculated) : 86.8   Vital Signs: Temp: 98.2 F (36.8 C) (12/29 1011) Temp Source: Oral (12/29 1011) BP: 149/99 mmHg (12/29 1011) Pulse Rate: 65 (12/29 1011)  Labs:  Recent Labs  01/24/15 1029 01/24/15 1329  01/25/15 0351  01/25/15 2019 01/26/15 0230 01/26/15 0819  HGB 14.6  16.0  --   --  13.3  --   --   --   --   HCT 45.2  47.0  --   --  42.6  --   --   --   --   PLT 183  --   --  170  --   --   --   --   LABPROT  --  51.2*  --  49.0*  --   --  49.1*  --   INR  --  5.94*  --  5.59*  --   --  5.63*  --   CREATININE 4.12*  3.30*  --   < > 3.73*  < > 3.28* 3.17* 3.08*  < > = values in this interval not displayed.  Estimated Creatinine Clearance: 26 mL/min (by C-G formula based on Cr of 3.08).   Medications:  Scheduled:  . amLODipine  10 mg Oral Daily  . aspirin EC  81 mg Oral Daily  . atorvastatin  40 mg Oral q1800  . feeding supplement (ENSURE ENLIVE)  237 mL Oral TID BM  . pantoprazole  40 mg Oral Daily  . sodium chloride  3 mL Intravenous Q12H  . Warfarin - Pharmacist Dosing Inpatient   Does not apply q1800    Assessment: 69yo male with poor po intake pta and wt loss.  INR remains elevated at 5.63 .  His last Coumadin dose was 12/24.  Hg and pltc wnl.  Poor po intake has perhaps caused a deficiency in Vit K which may be contributing to elevated INR.  Goal of Therapy:  INR 2-3 Monitor platelets by anticoagulation protocol: Yes   Plan:  No Coumadin today Watch for s/s of bleeding Continue daily INR  Herby Abraham, Pharm.D. [phone] 01/26/2015 11:10 AM

## 2015-01-27 LAB — CBC
HCT: 38.3 % — ABNORMAL LOW (ref 39.0–52.0)
HEMOGLOBIN: 12.2 g/dL — AB (ref 13.0–17.0)
MCH: 27.5 pg (ref 26.0–34.0)
MCHC: 31.9 g/dL (ref 30.0–36.0)
MCV: 86.5 fL (ref 78.0–100.0)
Platelets: 142 10*3/uL — ABNORMAL LOW (ref 150–400)
RBC: 4.43 MIL/uL (ref 4.22–5.81)
RDW: 15 % (ref 11.5–15.5)
WBC: 8.4 10*3/uL (ref 4.0–10.5)

## 2015-01-27 LAB — BASIC METABOLIC PANEL
ANION GAP: 8 (ref 5–15)
BUN: 50 mg/dL — ABNORMAL HIGH (ref 6–20)
CALCIUM: 8.8 mg/dL — AB (ref 8.9–10.3)
CHLORIDE: 116 mmol/L — AB (ref 101–111)
CO2: 22 mmol/L (ref 22–32)
Creatinine, Ser: 2.79 mg/dL — ABNORMAL HIGH (ref 0.61–1.24)
GFR calc non Af Amer: 22 mL/min — ABNORMAL LOW (ref >60)
GFR, EST AFRICAN AMERICAN: 25 mL/min — AB (ref >60)
Glucose, Bld: 117 mg/dL — ABNORMAL HIGH (ref 65–99)
Potassium: 4.1 mmol/L (ref 3.5–5.1)
SODIUM: 146 mmol/L — AB (ref 135–145)

## 2015-01-27 LAB — PROTIME-INR
INR: 4.68 — ABNORMAL HIGH (ref 0.00–1.49)
Prothrombin Time: 42.7 seconds — ABNORMAL HIGH (ref 11.6–15.2)

## 2015-01-27 NOTE — Progress Notes (Addendum)
CSW provided bed offers- [facility] can [facility] pt pending insurance auth- per MD anticipate DC tomorrow if pt stable  [employer] [id number]  DNR on chart to be signed  CSW will continue to follow  Merlyn Lot, [facility] Clinical Social Worker [phone]

## 2015-01-27 NOTE — Discharge Summary (Signed)
[facility] Discharge Summary  Patient name: Brian Burns Medical record number: [id number] Date of birth: 05/28/1945 Age: 69 y.o. Gender: male Date of Admission: 01/24/2015  Date of Discharge: 02/11/2015 Admitting Physician: Zenia Resides, MD  Primary Care Provider: Lavon Paganini, MD Consultants: GI  Indication for Hospitalization: Weight loss, anorexia, abdominal pain  Discharge Diagnoses/Problem List:  Anorexia, weight loss, abdominal pain, AoCKD, urethral stricuures. PAD. HTN, BPH, BM2, AAA  Disposition: SNF  Discharge Condition: stable  Discharge Exam:  General: lying in bed, with NGT in place, Jevity feed at 45 ml/hr Nares: NGT in place, no blood. Oropharynx: clear, moist CV: regular rate and rythm. S1 & S2 audible Resp: no apparent work of breathing, clear to auscultation bilaterally. GI: bowel sounds normal, no tenderness to palpation, no rebound or guarding, no mass.  GU: no suprapubic tenderness, or CVA tenderness Skin: no lesion MSK: no tenderness to palpation over his back.  Brief Hospital Course:  Patient is a 69 year old man who presented with nausea, vomiting, abdominal pain and weight loss. His hospital course by problem is outlined below:  Failure to thrive: patient noted to have an approximately 47 pound weight loss in the last two months. He had anorexia, nausea, vomiting (mostly heaving) and abdominal pain. Unclear what was causing this. We obtained a TSH, ESR, and CRP which were all normal. His BMP was significant for Na 155, K 5.5, Cl 122, BUN 88, Cr 4.12. These improved with free water. However, his Na and Cl rose everytime his free water was discontinued, and we had to put him back on free water. His lipase was 338 (55 four months ago) on admission and trended down to 133. INR was 5.94. His warfarin was held. His AST/ALT, Alk Phos, bili and albumin with normal range. CBC within normal range. Essure started per nutrition  recommendation but patient wasn't able to take. We obtained CT abdomen and pelvis with unchanged calcifications near the pancreatic head suggestive for prior pancreatitis, bladder wall thickening suggestive for cystitis and clusters of small lung nodules. He had EGD 8 months ago that showed logged food in oesophagus. We consulted GI who initially recommended doing calorie counts with a plan to do gastrostomy tube if no improvement. Remeron was also added at 15 mg daily. However, patient's oral intake remained poor. On 01/02, modified barium swallow showed no oropharyngeal dysphagia issues, but raised the question of esophageal dysphagia. On 01/04, diagnostic barium swallow finding concerning for narrowing of GE junction/dysmotility. However, this study was of poor quality due to vomiting. On 01/05, EGD without lesion. Empiric EGD esophageal dilation was done but no improvement in his oral intake. On 01/08 CREON was added. On 01/09, a decision was made to do manometry which didn't happen as patient wasn't cooperative during the procedure. Heller myotomy was considered but surgery didn't feel comfortable doing this without formal diagnosis of achalasia with manometry. They also recommended against peg placement as this could make myotomy difficult in case this is reconsidered in future. On 01/12, radiology placed NGT for feeding per GI recommendation. He was started on Jevity 1.2 formula at 15 ml/hr and increase by 10 ml every 8 hours to goal rate of 65 ml/hr. This was at 45 ml/hr at time of discharge.  We recommend checking his Mg, Phos and K level for refeeding syndrome.   Hypernatremia & hyperchloremia: urine osom 553 suggestive for possible partial DI.  It could also be secondary to poor oral fluid intake and  acute renal injury (likely prerenal). He was recently hospitalized with bladder neck obstruction as well. His electrolytes improved with D5W. Now on NGT feed. Anticipate this to take care of  this.  Hyperkalemia: resolved  AoCKD3. patient noted to have Cr of 4.12 on admission. His Cr was 3.66 when he was discharged on 01/20/2015. This is likely prerenal based on FeNa and BUN:Cr ratio.This has improved with IVF. His creatinine trended down to 2.26. This could be his new baseline.  PAF: patient's INR was noted to be supratherapeutic on admission to 5.5. Likely due to malnutrtion. We held the patient's coumadin and trended his INR. He showed no signs of active bleeding. He was given a dose of oral vitamin K during this admission to reverse any potential vitamin K deficiency. His INR trended down to therapeutic range. His warfarin was resumed. INR at time of discharge was 2.30.  Hx of CVA's: had word finding and gait issues with ongoing swallowing issues although the earlier could be due malnutrition as well. RPR and HIV were negative. MCV within normal limit to suspect folate and vit B12 def. CT head with atrophy with small vessel chronic ischemic changes of deep cerebral white matter, and old infarcts in cerebellum and BG. MRI on 01/03 showed signs of chronic infarct but no acute intracranial process.   BPH/Urethral strictures: stable. Continued foley and his home medications. Recommend urology follow up as an outpatient  Patient's other chronic conditions were stable.  Issues for Follow Up:   Failure to thrive - appears a combination of structural and possible physiologic factors. Continue Jevity via NGT as above. Monitor his Mg, Phos and K. GI to schedule outpatient manometry in two weeks  INR - 2.30 on discharge. Monitor INR and adjust his warfarin accordinly  Incidental lung nodule - Will need repeat Ct scan in 1 year.   AoCKD - improved. Repeat BMP   Electrolyte issues: monitor his BMP  Significant Procedures: Barium swallow, empiric EGD esophageal dilation, NGT placement.  Significant Labs and Imaging:  Ct Abdomen Pelvis Wo Contrast  01/24/2015 Cluster of small nodules  in the basilar right lower lobe, likely infectious/inflammatory. Additional 3 mm nodule in the left lower lobe. If the patient is at high risk for bronchogenic carcinoma, follow-up chest CT at 1 year is recommended. If the patient is at low risk, no follow-up is needed. This recommendation follows the consensus statement: Guidelines for Management of Small Pulmonary Nodules Detected on CT Scans: A Statement from the [place] as published in Radiology 2005; 237:395-400. 2. Unchanged calcifications near the pancreatic head which may reflect prior pancreatitis. 3. Infrarenal abdominal aortic and bilateral common iliac artery aneurysms. 4. Bladder wall thickening which may reflect cystitis. Electronically Signed   By: Logan Bores M.D.   On: 01/24/2015 11:04   Ct Head Wo Contrast  01/24/2015 Atrophy with small vessel chronic ischemic changes of deep cerebral white matter. Old infarcts as above. No new intracranial abnormalities identified. Electronically Signed   By: Lavonia Dana M.D.   On: 01/24/2015 10:46   Ct Chest Wo Contrast  01/24/2015   Cluster of small nodules in the basilar right lower lobe, likely infectious/inflammatory. Additional 3 mm nodule in the left lower lobe. If the patient is at high risk for bronchogenic carcinoma, follow-up chest CT at 1 year is recommended. If the patient is at low risk, no follow-up is needed. This recommendation follows the consensus statement: Guidelines for Management of Small Pulmonary Nodules Detected on CT Scans: A  Statement from the [place] as published in Radiology 2005; 237:395-400. 2. Unchanged calcifications near the pancreatic head which may reflect prior pancreatitis. 3. Infrarenal abdominal aortic and bilateral common iliac artery aneurysms. 4. Bladder wall thickening which may reflect cystitis. Electronically Signed   By: Logan Bores M.D.   On: 01/24/2015 11:04   Recent Labs Lab 02/05/15 0320 02/06/15 0543  WBC 7.9 8.8  HGB 11.5*  11.9*  HCT 36.3* 37.8*  PLT 106* 142*    Recent Labs Lab 02/05/15 0320 02/06/15 0543 02/07/15 0302 02/08/15 0515 02/09/15 0800  NA 148* 149* 136 146* 145  K 4.0 4.3 3.9 4.0 3.9  CL 117* 115* 108 115* 113*  CO2 22 24 18* 19* 21*  GLUCOSE 121* 141* 431* 141* 121*  BUN 47* 47* 45* 47* 46*  CREATININE 2.50* 2.46* 2.47* 2.45* 2.26*  CALCIUM 8.9 9.0 8.3* 8.8* 8.8*   ESR 11 CRP 0.5 TSH 1.682  Results/Tests Pending at Time of Discharge: none  Discharge Medications:    Medication List    STOP taking these medications        doxycycline 100 MG tablet  Commonly known as:  VIBRA-TABS     HYDROcodone-acetaminophen 5-325 MG tablet  Commonly known as:  NORCO     meclizine 25 MG tablet  Commonly known as:  ANTIVERT     metFORMIN 500 MG tablet  Commonly known as:  GLUCOPHAGE      TAKE these medications        amLODipine 10 MG tablet  Commonly known as:  NORVASC  Take 1 tablet (10 mg total) by mouth daily.     aspirin EC 81 MG tablet  Take 81 mg by mouth daily.     atorvastatin 40 MG tablet  Commonly known as:  LIPITOR  Take 1 tablet (40 mg total) by mouth daily.     feeding supplement (JEVITY 1.2 CAL) Liqd  Place 1,000 mLs into feeding tube continuous.     finasteride 5 MG tablet  Commonly known as:  PROSCAR  Take 1 tablet (5 mg total) by mouth daily.     gabapentin 100 MG capsule  Commonly known as:  NEURONTIN  Take 1 capsule (100 mg total) by mouth 3 (three) times daily.     lipase/protease/amylase 12000 units Cpep capsule  Commonly known as:  CREON  Take 1 capsule (12,000 Units total) by mouth 3 (three) times daily with meals.     mirtazapine 15 MG tablet  Commonly known as:  REMERON  Take 1 tablet (15 mg total) by mouth at bedtime.     omeprazole 20 MG capsule  Commonly known as:  PRILOSEC  Take 1 capsule (20 mg total) by mouth daily.     ondansetron 4 MG tablet  Commonly known as:  ZOFRAN  Take 1 tablet (4 mg total) by mouth every 6 (six) hours  as needed for nausea or vomiting.     phenazopyridine 200 MG tablet  Commonly known as:  PYRIDIUM  Take 1 tablet (200 mg total) by mouth 3 (three) times daily as needed for pain.     sucralfate 1 g tablet  Commonly known as:  CARAFATE  Take 1 tablet (1 g total) by mouth 3 (three) times daily as needed.     tamsulosin 0.4 MG Caps capsule  Commonly known as:  FLOMAX  Take 1 capsule (0.4 mg total) by mouth daily.     warfarin 2.5 MG tablet  Commonly known as:  COUMADIN  Take 0.5 tablets (1.25 mg total) by mouth daily. Starting 12/24, then as directed from INR check on 12/27        Discharge Instructions: Please refer to Patient Instructions section of EMR for full details.  Patient was counseled important signs and symptoms that should prompt return to medical care, changes in medications, dietary instructions, activity restrictions, and follow up appointments.   Follow-Up Appointments: Follow-up Information    Follow up with [facility] .   Specialty:  [place] information:   [place] [phone]     Mercy Riding, MD 02/11/2015, 11:28 AM PGY-1, [place]

## 2015-01-27 NOTE — Progress Notes (Addendum)
ANTICOAGULATION CONSULT NOTE - Follow Up Consult  Pharmacy Consult for Coumadin Indication: atrial fibrillation  Allergies  Allergen Reactions  . Novocain [Procaine Hcl] Nausea And Vomiting    Patient Measurements: Height: 6\' 4"  (193 cm) Weight: 173 lb 14.4 oz (78.881 kg) IBW/kg (Calculated) : 86.8   Vital Signs: Temp: 98.4 F (36.9 C) (12/30 1244) Temp Source: Oral (12/30 1244) BP: 122/84 mmHg (12/30 1244) Pulse Rate: 86 (12/30 1244)  Labs:  Recent Labs  01/25/15 0351  01/26/15 0230 01/26/15 0819 01/26/15 1720 01/27/15 0259  HGB 13.3  --   --   --   --  12.2*  HCT 42.6  --   --   --   --  38.3*  PLT 170  --   --   --   --  142*  LABPROT 49.0*  --  49.1*  --   --  42.7*  INR 5.59*  --  5.63*  --   --  4.68*  CREATININE 3.73*  < > 3.17* 3.08* 2.92* 2.79*  < > = values in this interval not displayed.  Estimated Creatinine Clearance: 28.7 mL/min (by C-G formula based on Cr of 2.79).  Assessment: 70yo male with poor po intake pta and wt loss.  His last Coumadin dose was 12/24.  His INR remains elevated at 4.68 after vitamin K 5 mg given 12/29 at 1300.  No coumadin given this admission. Hg 12.2, pltc down to 142. No bleeding reported.   Goal of Therapy:  INR 2-3   Plan:  Got vitamin K 12/29 No Coumadin today Watch for s/s of bleeding Continue daily INR  Herby Abraham, Pharm.D. [phone] 01/27/2015 1:30 PM

## 2015-01-27 NOTE — Progress Notes (Signed)
Assumed care from Christina,RN. 

## 2015-01-27 NOTE — Care Management Important Message (Signed)
Important Message  Patient Details  Name: Brian Burns MRN: [id number] Date of Birth: [birth year]   Medicare Important Message Given:  Yes    Darrold Span, RN 01/27/2015, 3:55 PM

## 2015-01-27 NOTE — Progress Notes (Signed)
Family Medicine Teaching Service Daily Progress Note Intern Pager: [phone]  Patient name: Brian Burns Medical record number: [id number] Date of birth: [birth year] Age: 69 y.o. Gender: male  Primary Care Provider: Shirlee Latch, MD Consultants: Nephrology, GI Code Status: DNR  Pt Overview and Major Events to Date:  12/27: admit for abdominal pain  Assessment and Plan: Brian Burns is a 69 y.o. male presenting with abdominal pain, weight loss. PMH is significant for urethral strictures, CAD, paroxysmal afib, CKD, HTN, CVA x2, BPH, DM 2, AAA.   Weight loss: He has lost considerable weight over the last year and is due for colonoscopy. Last colonoscopy was in 2004, with flex sigmoidoscopy in 2011. Given weight loss in setting of not being up to date on this cancer screening, concern for potential malignancy versus benign GI pathology vs psychosocial causes . He has normal Hct - GI consulted, appreciate assistance.  - Recommended that patient try to eat. If not may need EGD vs esophogram and PEG tube placement - Will start Remeron to stimulate appetite.  - Nutrition consulted, appreciate assistance - PT/OT recommend SNF placement  Dehydration/Nausea/Vomiting: Resolved.  - telemetry - D5W 50 cc/hr  - phenergan 12.5mg  IV q6hrs for nausea - diet as tolerated  AKI on CKD3: improving. Prior to last discharge 3.3 (was up to 7 on that admission). Baseline before past month seems to be around 1.3. Nephrology was previously consulted for this and agreed with post obstruction causing prior AKI.  - fluids as above - Monitor BMP  BPH: apparently hasn't been taking finasteride or flomax - continue foley - monitor UOP - will need urology follow up as an outpatient  Hypernatremia, hyperchloremia.  Now improving. Pt had similar electrolyte disturbances associated with urinary obstruction - Fluids as above - Trend BMP  Paroxysmal afib: in NSR currently, anticoagulated with warfarin,  INR supra-therapeutic to 5.5 12/28. INR potentially elevated in setting of decreased PO intake and potential vitamin K deficiency. Renal impairment also potentially contributing as well. No signs of active bleeding. - Will give 5mg  of vitamin K to reverse any vitamin K deficiency - follow INR - warfarin per pharm, but consider alternative anticoagulation like Eliquis  CAD / CVA. - Continue home atorvastatin and aspirin  Left lung nodule: extensive smoking history - repeat CT scan in 1 year   FEN/GI: heart healthy / SLIV Prophylaxis: warfarin  Disposition: Admitted pending above work up. Possible discharge to SNF on 12/31 if does well without fluids. .   Subjective:  Feels cold this morning. Otherwise no complaints. No N/V/D.   Objective: Temp:  [98.2 F (36.8 C)] 98.2 F (36.8 C) (12/30 0503) Pulse Rate:  [65-74] 74 (12/30 0503) Resp:  [18] 18 (12/30 0503) BP: (122-149)/(88-99) 138/98 mmHg (12/30 0503) SpO2:  [100 %] 100 % (12/30 0503) Physical Exam: General: NAD, sitting in bedside chair Cardiovascular: RRR, no murmurs Respiratory: CTAB, no wheezes Abdomen: soft, non tender Extremities: no LE edema or tenderness  Laboratory:  Recent Labs Lab 01/24/15 1029 01/25/15 0351 01/27/15 0259  WBC 12.3* 12.8* 8.4  HGB 14.6  16.0 13.3 12.2*  HCT 45.2  47.0 42.6 38.3*  PLT 183 170 142*    Recent Labs Lab 01/22/15 1610  01/26/15 0819 01/26/15 1720 01/27/15 0259  NA 148*  < > 146* 144 146*  K 5.0  < > 4.4 4.1 4.1  CL 116*  < > 119* 114* 116*  CO2 22  < > 19* 21* 22  BUN 74*  < >  62* 54* 50*  CREATININE 3.66*  < > 3.08* 2.92* 2.79*  CALCIUM 9.4  < > 8.6* 8.5* 8.8*  PROT 9.1*  --   --   --   --   BILITOT 0.5  --   --   --   --   ALKPHOS 140*  --   --   --   --   ALT 38  --   --   --   --   AST 35  --   --   --   --   GLUCOSE 148*  < > 150* 135* 117*  < > = values in this interval not displayed.  Ardith Dark, MD 01/27/2015, 8:17 AM PGY-2, [facility] FPTS Intern pager: [phone], text pages welcome

## 2015-01-28 LAB — CBC
HCT: 39.2 % (ref 39.0–52.0)
Hemoglobin: 12.5 g/dL — ABNORMAL LOW (ref 13.0–17.0)
MCH: 27.3 pg (ref 26.0–34.0)
MCHC: 31.9 g/dL (ref 30.0–36.0)
MCV: 85.6 fL (ref 78.0–100.0)
PLATELETS: 139 10*3/uL — AB (ref 150–400)
RBC: 4.58 MIL/uL (ref 4.22–5.81)
RDW: 15.1 % (ref 11.5–15.5)
WBC: 9.5 10*3/uL (ref 4.0–10.5)

## 2015-01-28 LAB — HEPATIC FUNCTION PANEL
ALT: 20 U/L (ref 17–63)
AST: 27 U/L (ref 15–41)
Albumin: 3.5 g/dL (ref 3.5–5.0)
Alkaline Phosphatase: 102 U/L (ref 38–126)
BILIRUBIN INDIRECT: 0.4 mg/dL (ref 0.3–0.9)
Bilirubin, Direct: 0.2 mg/dL (ref 0.1–0.5)
TOTAL PROTEIN: 6.9 g/dL (ref 6.5–8.1)
Total Bilirubin: 0.6 mg/dL (ref 0.3–1.2)

## 2015-01-28 LAB — BASIC METABOLIC PANEL
Anion gap: 10 (ref 5–15)
BUN: 48 mg/dL — AB (ref 6–20)
CO2: 20 mmol/L — ABNORMAL LOW (ref 22–32)
CREATININE: 2.73 mg/dL — AB (ref 0.61–1.24)
Calcium: 8.8 mg/dL — ABNORMAL LOW (ref 8.9–10.3)
Chloride: 118 mmol/L — ABNORMAL HIGH (ref 101–111)
GFR calc Af Amer: 26 mL/min — ABNORMAL LOW (ref >60)
GFR, EST NON AFRICAN AMERICAN: 23 mL/min — AB (ref >60)
Glucose, Bld: 108 mg/dL — ABNORMAL HIGH (ref 65–99)
Potassium: 4.2 mmol/L (ref 3.5–5.1)
SODIUM: 148 mmol/L — AB (ref 135–145)

## 2015-01-28 LAB — PROTIME-INR
INR: 4.03 — ABNORMAL HIGH (ref 0.00–1.49)
PROTHROMBIN TIME: 38.2 s — AB (ref 11.6–15.2)

## 2015-01-28 LAB — LIPASE, BLOOD: LIPASE: 195 U/L — AB (ref 11–51)

## 2015-01-28 MED ORDER — DEXTROSE 5 % IV SOLN
INTRAVENOUS | Status: DC
  Administered 2015-01-28: 14:00:00 via INTRAVENOUS

## 2015-01-28 NOTE — Progress Notes (Signed)
CSW will continue to follow for DC to [facility])- per MD do not anticipate pt will be medically stable today for DC.  CSW will continue to follow.  Merlyn Lot, LCSWA Clinical Social Worker [phone]

## 2015-01-28 NOTE — Progress Notes (Signed)
ANTICOAGULATION CONSULT NOTE - Follow Up Consult  Pharmacy Consult for Coumadin Indication: atrial fibrillation  Allergies  Allergen Reactions  . Novocain [Procaine Hcl] Nausea And Vomiting    Patient Measurements: Height: 6\' 4"  (193 cm) Weight: 173 lb 14.4 oz (78.881 kg) IBW/kg (Calculated) : 86.8   Vital Signs: Temp: 97.9 F (36.6 C) (12/31 0437) Temp Source: Oral (12/31 0437) BP: 100/49 mmHg (12/31 0437) Pulse Rate: 82 (12/31 0437)  Labs:  Recent Labs  01/26/15 0230  01/26/15 1720 01/27/15 0259 01/28/15 0340  HGB  --   --   --  12.2* 12.5*  HCT  --   --   --  38.3* 39.2  PLT  --   --   --  142* 139*  LABPROT 49.1*  --   --  42.7* 38.2*  INR 5.63*  --   --  4.68* 4.03*  CREATININE 3.17*  < > 2.92* 2.79* 2.73*  < > = values in this interval not displayed.  Estimated Creatinine Clearance: 29.3 mL/min (by C-G formula based on Cr of 2.73).  Assessment: 69yo male with poor po intake pta and wt loss.  His last Coumadin dose was 12/24.  His INR remains supratherapeutic but trending down to 4.03. He received vitamin K 5 mg on 12/29.  No coumadin given this admission. Hg 12.5, pltc down to 139. No bleeding reported.   Goal of Therapy:  INR 2-3   Plan:  Got vitamin K 12/29 No Coumadin today Watch for s/s of bleeding Continue daily INR  Bayard Hugger, PharmD, BCPS  Clinical Pharmacist  Pager: [phone]   01/28/2015 9:46 AM

## 2015-01-28 NOTE — Progress Notes (Signed)
Nutrition Consult/Follow Up  DOCUMENTATION CODES:   Non-severe (moderate) malnutrition in context of chronic illness  INTERVENTION:    Initiate 48 calorie count, RD to follow-up on results Monday, 1/2   Continue Ensure Enlive po BID, each supplement provides 350 kcal and 20 grams of protein  NEW NUTRITION DIAGNOSIS:   Inadequate oral intake related to altered GI function, poor appetite as evidenced by per patient/family report, ongoing  GOAL:   Patient will meet greater than or equal to 90% of their needs, unmet  MONITOR:   PO intake, Supplement acceptance, Labs, Weight trends, I & O's  ASSESSMENT:   69 yo Male with PMH significant for urethral strictures, CAD, paroxysmal afib, CKD, HTN, CVA x2, BPH, DM 2, AAA; presented with abdominal pain and weight loss.  RD consulted for calorie count.  Initiate nutrition assessment completed 1/28.  RD briefly spoke with pt's daughter at bedside.  She is a bit frustrated.  Patient's PO intake remains very poor.  Daughter reports after he consumes food it "is coming back up".  Pt had Ensure Enlive oral nutrition supplement on tray table -- he is drinking some.  RD spoke with Dr. Matthias Hughs via telephone regarding nutrition care plan.  Possible PEG tube placement for nutrition support.  Will initiate calorie count.  Diet Order:  Diet Heart Room service appropriate?: Yes; Fluid consistency:: Thin  Skin:  Reviewed, no issues  Last BM:  N/A  Height:   Ht Readings from Last 1 Encounters:  01/24/15 6\' 4"  (1.93 m)    Weight:   Wt Readings from Last 1 Encounters:  01/24/15 173 lb 14.4 oz (78.881 kg)    Ideal Body Weight:  91.8 kg  BMI:  Body mass index is 21.18 kg/(m^2).  Estimated Nutritional Needs:   Kcal:  2100-2300  Protein:  110-120 gm  Fluid:  2.1-2.3 L  EDUCATION NEEDS:   No education needs identified at this time  Maureen Chatters, RD, LDN Pager #: [phone] After-Hours Pager #: [phone]

## 2015-01-28 NOTE — Progress Notes (Signed)
Family Medicine Teaching Service Daily Progress Note Intern Pager: [phone]  Patient name: Brian Burns Medical record number: [id number] Date of birth: 11-12-1945 Age: 69 y.o. Gender: male  Primary Care Provider: Shirlee Latch, MD Consultants: Nephrology, GI Code Status: DNR  Pt Overview and Major Events to Date:  12/27: admit for abdominal pain  Assessment and Plan: Brian Burns is a 69 y.o. male presenting with abdominal pain, weight loss. PMH is significant for urethral strictures, CAD, paroxysmal afib, CKD, HTN, CVA x2, BPH, DM 2, AAA.   Weight loss: He has lost considerable weight over the last year and is due for colonoscopy. Last colonoscopy was in 2004, with flex sigmoidoscopy in 2011. Given weight loss in setting of not being up to date on this cancer screening, concern for potential malignancy versus benign GI pathology vs psychosocial causes . He has normal Hct. GI Recommended that patient try to eat. If not may need EGD vs esophogram and PEG tube placement - Continue Remeron to stimulate appetite.  - Nutrition consulted, appreciate assistance - PT/OT recommend SNF placement  Dehydration: possibly contributing to electrolyte abnormalities. No current nausea with an episode of emesis earlier today. - telemetry - D5W 50 cc/hr  - phenergan 12.5mg  IV q6hrs for nausea - diet as tolerated  AKI on CKD3: improving. Prior to last discharge 3.3 (was up to 7 on that admission). Baseline before past month seems to be around 1.3. Nephrology was previously consulted for this and agreed with post obstruction causing prior AKI.  - fluids as above - Monitor BMP  BPH: apparently hasn't been taking finasteride or flomax. UOP: over last 24 hours - continue foley - monitor UOP - will need urology follow up as an outpatient  Hypernatremia, hyperchloremia: slightly worsening off fluids. Patient not taking good PO - Fluids as above - Trend BMP  Paroxysmal afib: in NSR  currently, anticoagulated with warfarin, INR supra-therapeutic to 5.5 12/28. INR potentially elevated in setting of decreased PO intake and potential vitamin K deficiency. Renal impairment also potentially contributing as well. No signs of active bleeding. - Will give 5mg  of vitamin K to reverse any vitamin K deficiency - follow INR - warfarin per pharm, but consider alternative anticoagulation like Eliquis  CAD / CVA. - Continue home atorvastatin and aspirin  Left lung nodule: extensive smoking history - repeat CT scan in 1 year   FEN/GI: heart healthy / SLIV Prophylaxis: warfarin  Disposition: Admitted pending above work up. Possible discharge to SNF on 12/31 if does well without fluids. .   Subjective:  Had an episode of emesis when taking Remeron earlier today. Otherwise, he states no complaints and is willing to eat his wife's food (she is bringing grits and eggs).   Objective: Temp:  [97.9 F (36.6 C)-98.4 F (36.9 C)] 97.9 F (36.6 C) (12/31 0437) Pulse Rate:  [82-86] 82 (12/31 0437) Resp:  [18] 18 (12/31 0437) BP: (100-146)/(49-102) 100/49 mmHg (12/31 0437) SpO2:  [99 %-100 %] 99 % (12/31 0437) Physical Exam: General: NAD, lying in bed, no distress HEENT: dry mucous membranes Cardiovascular: RRR, no murmurs Respiratory: CTAB, no wheezes Abdomen: soft, non tender Extremities: no LE edema or tenderness Skin: Tenting  Laboratory:  Recent Labs Lab 01/25/15 0351 01/27/15 0259 01/28/15 0340  WBC 12.8* 8.4 9.5  HGB 13.3 12.2* 12.5*  HCT 42.6 38.3* 39.2  PLT 170 142* 139*    Recent Labs Lab 01/22/15 1610  01/26/15 1720 01/27/15 0259 01/28/15 0340  NA 148*  < >  144 146* 148*  K 5.0  < > 4.1 4.1 4.2  CL 116*  < > 114* 116* 118*  CO2 22  < > 21* 22 20*  BUN 74*  < > 54* 50* 48*  CREATININE 3.66*  < > 2.92* 2.79* 2.73*  CALCIUM 9.4  < > 8.5* 8.8* 8.8*  PROT 9.1*  --   --   --   --   BILITOT 0.5  --   --   --   --   ALKPHOS 140*  --   --   --   --   ALT  38  --   --   --   --   AST 35  --   --   --   --   GLUCOSE 148*  < > 135* 117* 108*  < > = values in this interval not displayed.  Narda Bonds, MD 01/28/2015, 8:41 AM PGY-3 [place] [facility] FPTS Intern pager: [phone], text pages welcome

## 2015-01-28 NOTE — Progress Notes (Signed)
Patient took a bite if each item on his plate, but ate nothing in its entirety Pt says he did not have any nausea

## 2015-01-28 NOTE — Progress Notes (Signed)
GASTROENTEROLOGY PROGRESS NOTE  Problem:   Anorexia and weight loss.  Subjective: Patient states he is eating okay.  Objective: CT neg for intra-abd mass or overt malignancy to explain sx.  EGD 04/2014 neg for evid of malignancy or stricture.  Assessment: Idiopathic anorexia in setting of chronic pancreatitis. Questionable dysphagia.  Plan: 1.Calorie counts.  Have spoken to dietitian. 2. Consider formal Psych eval to screen for depression, which, in older patients, can present as vegetative (physical) symptoms (e.g., anorexia, weight loss) without necessarily associated dysphoria. However, patient does not seem overtly depressed to me. 3. Will follow at a distance (every several days); please call us if earlier input is desired.  4. Depending on whether he has evidence of persisting dysphagia symptoms in the hospital, consideration could be given to a barium swallow with a tablet to assess esophageal anatomy and motility.  5. Consider gastrostomy tube placement if poor oral intake continues and pt desires nutrition support.  Florencia Reasons, M.D. 01/28/2015 10:33 AM  Pager [phone] If no answer or after 5 PM call [phone]

## 2015-01-29 LAB — BASIC METABOLIC PANEL
ANION GAP: 7 (ref 5–15)
BUN: 51 mg/dL — ABNORMAL HIGH (ref 6–20)
CALCIUM: 8.8 mg/dL — AB (ref 8.9–10.3)
CO2: 21 mmol/L — AB (ref 22–32)
CREATININE: 3.03 mg/dL — AB (ref 0.61–1.24)
Chloride: 119 mmol/L — ABNORMAL HIGH (ref 101–111)
GFR, EST AFRICAN AMERICAN: 23 mL/min — AB (ref >60)
GFR, EST NON AFRICAN AMERICAN: 20 mL/min — AB (ref >60)
Glucose, Bld: 152 mg/dL — ABNORMAL HIGH (ref 65–99)
Potassium: 4.3 mmol/L (ref 3.5–5.1)
Sodium: 147 mmol/L — ABNORMAL HIGH (ref 135–145)

## 2015-01-29 LAB — PROTIME-INR
INR: 3.54 — AB (ref 0.00–1.49)
PROTHROMBIN TIME: 34.7 s — AB (ref 11.6–15.2)

## 2015-01-29 MED ORDER — DOCUSATE SODIUM 100 MG PO CAPS: 100.0000 mg | ORAL_CAPSULE | Freq: Two times a day (BID) | ORAL | Status: DC | PRN

## 2015-01-29 NOTE — Progress Notes (Signed)
Pt has'nt had a BM since the 16th of December. MD notified. MD ordered for Enema. Will continue to monitor.

## 2015-01-29 NOTE — Progress Notes (Signed)
Family Medicine Teaching Service Daily Progress Note Intern Pager: [phone]  Patient name: Brian Burns Medical record number: [id number] Date of birth: [birth year] Age: 70 y.o. Gender: male  Primary Care Provider: Lavon Paganini, MD Consultants: Nephrology, GI Code Status: DNR  Pt Overview and Major Events to Date:  12/27: admit for abdominal pain  Assessment and Plan: Brian Burns is a 70 y.o. male presenting with abdominal pain, weight loss. PMH is significant for urethral strictures, CAD, paroxysmal afib, CKD, HTN, CVA x2, BPH, DM 2, AAA.   Weight loss: He has lost considerable weight over the last year and is due for colonoscopy. Last colonoscopy was in 2004, with flex sigmoidoscopy in 2011. Given weight loss in setting of not being up to date on this cancer screening, concern for potential malignancy versus benign GI pathology vs psychosocial causes . He has normal Hct. GI Recommended that patient try to eat. If not may need EGD vs esophogram and PEG tube placement. Currently calorie counting - Continue Remeron to stimulate appetite - Nutrition consulted, appreciate assistance - PT/OT recommend SNF placement. SW already met with patient, patient's wife and patient's daughter. - Will touch base with PT to have them work with patient again as he has not made any effort to be active himself  Dehydration: possibly contributing to electrolyte abnormalities. - increase to D5W 75 cc/hr  - phenergan 12.44m IV q6hrs for nausea - encouraged to drink fluids throughout the day  Constipation: no bowel movement for two weeks - start colace - soap suds enema ordered already  AKI on CKD3: Slightly worsened, likely to dehydration that occurred after discontinuing IV fluids and patient failing to take good PO. With history of obstructive kidney injury and current dehydration, would be helpful to differentiate current likely cause. However, favoring pre-renal cause. - FENa - fluids as  above - Monitor BMP  BPH: apparently hasn't been taking finasteride or flomax. UOP: 11519mover last 24 hours - continue foley - monitor UOP - will need urology follow up as an outpatient  Hypernatremia, hyperchloremia: stable. Fluids restarted. Patient not taking good PO - Fluids as above - Trend BMP - Continue to encourage PO  Paroxysmal afib: in NSR currently, anticoagulated with warfarin, INR supra-therapeutic to 5.5 12/28. INR potentially elevated in setting of decreased PO intake and potential vitamin K deficiency. Renal impairment also potentially contributing as well. No signs of active bleeding. - follow INR - warfarin per pharm, but consider alternative anticoagulation like Eliquis before discharge  CAD / CVA. - Continue home atorvastatin and aspirin  Left lung nodule: extensive smoking history - repeat CT scan in 1 year   FEN/GI: heart healthy, D5W '@75ml' /hr Prophylaxis: warfarin  Disposition: Discharge to SNF when medically stable  Subjective:  Patient reports no complaints other than some nausea with eating. He states this, and that he cannot taste the food, are the main reasons he has not been eating. He states that he has been drinking, however. He reports no abdominal pain. His last bowel movement was weeks ago.    Objective: Temp:  [98.1 F (36.7 C)-98.7 F (37.1 C)] 98.2 F (36.8 C) (01/01 0527) Pulse Rate:  [80-94] 80 (01/01 0527) Resp:  [17-18] 18 (01/01 0527) BP: (110-140)/(87-100) 130/97 mmHg (01/01 0527) SpO2:  [99 %-100 %] 100 % (01/01 0527)   Physical Exam: General: NAD, lying in bed, no distress HEENT: dry mucous membranes Cardiovascular: RRR, no murmurs Respiratory: CTAB, no wheezes Abdomen: soft, non tender Extremities: no LE edema or  tenderness Skin: Tenting skin  Laboratory:  Recent Labs Lab 01/25/15 0351 01/27/15 0259 01/28/15 0340  WBC 12.8* 8.4 9.5  HGB 13.3 12.2* 12.5*  HCT 42.6 38.3* 39.2  PLT 170 142* 139*    Recent  Labs Lab 01/22/15 1610  01/27/15 0259 01/28/15 0340 01/28/15 1139 01/29/15 0458  NA 148*  < > 146* 148*  --  147*  K 5.0  < > 4.1 4.2  --  4.3  CL 116*  < > 116* 118*  --  119*  CO2 22  < > 22 20*  --  21*  BUN 74*  < > 50* 48*  --  51*  CREATININE 3.66*  < > 2.79* 2.73*  --  3.03*  CALCIUM 9.4  < > 8.8* 8.8*  --  8.8*  PROT 9.1*  --   --   --  6.9  --   BILITOT 0.5  --   --   --  0.6  --   ALKPHOS 140*  --   --   --  102  --   ALT 38  --   --   --  20  --   AST 35  --   --   --  27  --   GLUCOSE 148*  < > 117* 108*  --  152*  < > = values in this interval not displayed.  Mariel Aloe, MD 01/29/2015, 6:48 AM PGY-3 [place] Intern pager: [phone], text pages welcome

## 2015-01-29 NOTE — Evaluation (Signed)
Clinical/Bedside Swallow Evaluation Patient Details  Name: Brian Burns MRN: [id number] Date of Birth: [birth year]  Today's Date: 01/29/2015 Time: SLP Start Time (ACUTE ONLY): 1545 SLP Stop Time (ACUTE ONLY): 1605 SLP Time Calculation (min) (ACUTE ONLY): 20 min  Past Medical History:  Past Medical History  Diagnosis Date  . Hypertension   . HLD (hyperlipidemia)   . Erectile dysfunction   . Chronic back pain   . CAD (coronary artery disease)   . GERD (gastroesophageal reflux disease)   . Arthritis     knees,lower back  . Carotid artery disease (HCC)   . PVD (peripheral vascular disease) with claudication (HCC)   . AAA (abdominal aortic aneurysm) without rupture (HCC) 06/2014    no change in last exam from 2015  . History of CVA (cerebrovascular accident)     2009  &  2013  . Type 2 diabetes mellitus (HCC)     no meds per pt  . Atrial flutter with rapid ventricular response (HCC) 01/16/2015  . Atrial fibrillation (HCC)   . Idiopathic pancreatitis 12/2014    acute   Past Surgical History:  Past Surgical History  Procedure Laterality Date  . Esophagogastroduodenoscopy N/A 05/15/2014    Procedure: ESOPHAGOGASTRODUODENOSCOPY (EGD);  Surgeon: Carman Ching, MD;  Location: [facility] ENDOSCOPY;  Service: Endoscopy;  Laterality: N/A;  . Cataract extraction w/ intraocular lens  implant, bilateral  april and may 2014  . Transthoracic echocardiogram  01-07-2012    mild to moderate dilated LV,  ef 45-50%,  mild basal hypokinesis,  grade I diastolic  dysfunction/  trivial AR/  mild MR  . Cardiovascular stress test  08-09-2009    mild global hypokinesis,  no ischemia or scar/  ef 41%  . Cystoscopy with urethral dilatation N/A 11/24/2014    Procedure: CYSTOSCOPY WITH URETHRAL DILATATION;  Surgeon: Crist Fat, MD;  Location: [facility];  Service: Urology;  Laterality: N/A;  BALLOON DILATION        . Cystoscopy with retrograde urethrogram N/A 11/24/2014    Procedure:  CYSTOSCOPY WITH RETROGRADE URETHROGRAM;  Surgeon: Crist Fat, MD;  Location: [facility];  Service: Urology;  Laterality: N/A;   HPI:  70 y.o. male presenting with abdominal pain, significant weight loss. PMH of urethral strictures, CAD, paroxysmal afib, CKD, HTN, CVA x2, BPH, DM 2, AAA. 12/27 CCT negative for acute intracranial process. CT neg for intra-abd mass.  EGD April 2016 neg for evidence of malignancy or stricture. GI following with recs to consider barium swallow should dysphagia symptoms persist.    Assessment / Plan / Recommendation Clinical Impression  Pt presents with an unusual constellation of dysphagia signs.  There are no focal cranial nerve deficits.  There is presence of lengthy oral holding of liquids and soft solids (>30) seconds, followed either by an effortful swallow or ultimate expectoration of materials.  This behavior is often seen with moderate-severe cognitive deficits/dementia, but pt's current MS does not appear to explain such behavior.  He is unable to pass a three-ounce water test secondary to self-limiting the quantity he is willing to consume.  Given his symptoms, pt may benefit from an MBS to provide further information about his deficits.  Pt/family agree. Recommend continuing current diet (regular/thin liquids) with plan for MBS next date.     Aspiration Risk  Risk for inadequate nutrition/hydration    Diet Recommendation    Continue regular foods, thin liquids  Medication Administration: Whole meds with liquid    Other  Recommendations Oral Care Recommendations: Oral care BID   Follow up Recommendations   (tba)    Frequency and Duration min 2x/week  1 week       Prognosis        Swallow Study   General HPI: 70 y.o. male presenting with abdominal pain, significant weight loss. PMH of urethral strictures, CAD, paroxysmal afib, CKD, HTN, CVA x2, BPH, DM 2, AAA. 12/27 CCT negative for acute intracranial process. CT neg for  intra-abd mass.  EGD April 2016 neg for evidence of malignancy or stricture. GI following with recs to consider barium swallow should dysphagia symptoms persist.  Type of Study: Bedside Swallow Evaluation Previous Swallow Assessment: none per records Diet Prior to this Study: Regular;Thin liquids Temperature Spikes Noted: No Respiratory Status: Room air History of Recent Intubation: No Behavior/Cognition: Alert;Cooperative;Confused Oral Cavity Assessment: Within Functional Limits Oral Care Completed by SLP: No Oral Cavity - Dentition: Edentulous Vision: Functional for self-feeding Self-Feeding Abilities: Able to feed self Patient Positioning: Upright in bed Baseline Vocal Quality: Normal Volitional Cough: Strong Volitional Swallow: Able to elicit    Oral/Motor/Sensory Function Overall Oral Motor/Sensory Function: Within functional limits   Ice Chips Ice chips: Not tested   Thin Liquid Thin Liquid: Impaired Presentation: Cup;Self Fed Oral Phase Functional Implications: Oral holding (>30 seconds) Pharyngeal  Phase Impairments: Suspected delayed Swallow    Nectar Thick Nectar Thick Liquid: Not tested   Honey Thick Honey Thick Liquid: Not tested   Puree Puree: Impaired Presentation: Spoon;Self Fed Oral Phase Functional Implications: Oral holding;Oral residue Pharyngeal Phase Impairments: Suspected delayed Swallow   Solid   GO    Solid: Not tested      Brian Burns, [place] CCC/SLP Pager [phone]   Brian Burns 01/29/2015,4:25 PM

## 2015-01-29 NOTE — Progress Notes (Signed)
Pt received soap suds enema. Pt could only tolerate 500cc. Will continue to monitor.

## 2015-01-29 NOTE — Progress Notes (Signed)
Pt had a successful small bowel movement. Pt worked with MD to get in the chair. Will continue to monitor

## 2015-01-29 NOTE — Progress Notes (Signed)
ANTICOAGULATION CONSULT NOTE - Follow Up Consult  Pharmacy Consult for Coumadin Indication: atrial fibrillation  Allergies  Allergen Reactions  . Novocain [Procaine Hcl] Nausea And Vomiting    Patient Measurements: Height: 6\' 4"  (193 cm) Weight: 173 lb 14.4 oz (78.881 kg) IBW/kg (Calculated) : 86.8   Vital Signs: Temp: 98.2 F (36.8 C) (01/01 0527) Temp Source: Oral (01/01 0527) BP: 130/97 mmHg (01/01 0527) Pulse Rate: 80 (01/01 0527)  Labs:  Recent Labs  01/27/15 0259 01/28/15 0340 01/29/15 0458  HGB 12.2* 12.5*  --   HCT 38.3* 39.2  --   PLT 142* 139*  --   LABPROT 42.7* 38.2* 34.7*  INR 4.68* 4.03* 3.54*  CREATININE 2.79* 2.73* 3.03*    Estimated Creatinine Clearance: 26.4 mL/min (by C-G formula based on Cr of 3.03).  Assessment: 69yo male with poor po intake pta and wt loss.  His last Coumadin dose was 12/24.  His INR remains supratherapeutic but trending down to 3.54. He received vitamin K 5 mg on 12/29.  No coumadin given this admission. Hg 12.5, pltc down to 139. No bleeding reported.   Goal of Therapy:  INR 2-3   Plan:  Got vitamin K 12/29 No Coumadin today Watch for s/s of bleeding Continue daily INR  Bayard Hugger, PharmD, BCPS  Clinical Pharmacist  Pager: [phone]   01/29/2015 9:58 AM

## 2015-01-30 ENCOUNTER — Inpatient Hospital Stay (HOSPITAL_COMMUNITY): Payer: Commercial Managed Care - HMO

## 2015-01-30 LAB — BASIC METABOLIC PANEL
ANION GAP: 11 (ref 5–15)
BUN: 52 mg/dL — ABNORMAL HIGH (ref 6–20)
CALCIUM: 8.9 mg/dL (ref 8.9–10.3)
CO2: 18 mmol/L — ABNORMAL LOW (ref 22–32)
Chloride: 121 mmol/L — ABNORMAL HIGH (ref 101–111)
Creatinine, Ser: 2.69 mg/dL — ABNORMAL HIGH (ref 0.61–1.24)
GFR, EST AFRICAN AMERICAN: 27 mL/min — AB (ref >60)
GFR, EST NON AFRICAN AMERICAN: 23 mL/min — AB (ref >60)
Glucose, Bld: 127 mg/dL — ABNORMAL HIGH (ref 65–99)
Potassium: 4.5 mmol/L (ref 3.5–5.1)
Sodium: 150 mmol/L — ABNORMAL HIGH (ref 135–145)

## 2015-01-30 LAB — PROTIME-INR
INR: 3.46 — AB (ref 0.00–1.49)
PROTHROMBIN TIME: 34.1 s — AB (ref 11.6–15.2)

## 2015-01-30 NOTE — Progress Notes (Signed)
Family Medicine Teaching Service Daily Progress Note Intern Pager: [phone]  Patient name: Brian Burns Medical record number: [id number] Date of birth: 1945/02/23 Age: 70 y.o. Gender: male  Primary Care Provider: Lavon Paganini, MD Consultants: Nephrology, GI Code Status: DNR  Pt Overview and Major Events to Date:  12/27: admit for abdominal pain 12/28: PT recommended SNF  12/29: [facility] to follow from distance  1/1: SLP evaluation   Assessment and Plan: Brian Burns is a 70 y.o. male presenting with abdominal pain, weight loss. PMH is significant for urethral strictures, CAD, paroxysmal afib on warfarin, CKD, HTN, CVA x2, BPH, DM 2, AAA.   Weight loss, moderate malnutrition in context of chronic illness:  Lipase in improving. Has been elevated for the past month.  - SLP rec's: MBS   - G tube placement today or tomorrow  - Continue Remeron to stimulate appetite - Nutrition consulted, appreciate assistance - PT/OT recommend SNF placement. SW already met with patient, patient's wife and patient's daughter. - Will touch base with PT to have them work with patient again as he has not made any effort to be active himself  Hx of CVA's: having word finding. Having some balance problems with ongoing swallowing issues. Concern for posterior CVA    - consider MRI   Dehydration: possibly contributing to electrolyte abnormalities. - phenergan 12.69m IV q6hrs for nausea - encouraged to drink fluids throughout the day  Hypernatremia, hyperchloremia: stable. Patient not taking good PO - Trend BMP - Continue to encourage PO  Constipation: achieved bowel movement on 1/1with soap suds enema  - continue colace  AKI on CKD3: secondary due bladder obstruction most likely. Slowly improving. - Monitor BMP  BPH: apparently hasn't been taking finasteride or flomax.  - continue foley - monitor UOP - will need urology follow up as an outpatient  Paroxysmal afib: in NSR currently,  anticoagulated with warfarin, INR supra-therapeutic to 5.5 12/28.  - follow INR - warfarin per pharm, but consider alternative anticoagulation like Eliquis before discharge  CAD: stable  - Continue home atorvastatin and aspirin  Left lung nodule: extensive smoking history. Observed on CT on 12/27 - repeat CT scan in 1 year   FEN/GI: heart healthy, KVO D5 + Free water   Prophylaxis: warfarin  Disposition: Discharge to SNF when medically stable  Subjective:  Still not eating well. Having vomiting after his barium swallow this morning.   Objective: Temp:  [98.6 F (37 C)-98.7 F (37.1 C)] 98.7 F (37.1 C) (01/02 0441) Pulse Rate:  [93-99] 99 (01/02 0441) Resp:  [16-18] 16 (01/02 0441) BP: (138-140)/(105-109) 140/105 mmHg (01/02 0441) SpO2:  [99 %] 99 % (01/02 0441)   Physical Exam: General: NAD, lying in bed, no distress HEENT: dry mucous membranes Cardiovascular: RRR, no murmurs Respiratory: CTAB, no wheezes Abdomen: soft, non tender Extremities: no LE edema or tenderness Skin: Tenting skin  Laboratory:  Recent Labs Lab 01/25/15 0351 01/27/15 0259 01/28/15 0340  WBC 12.8* 8.4 9.5  HGB 13.3 12.2* 12.5*  HCT 42.6 38.3* 39.2  PLT 170 142* 139*    Recent Labs Lab 01/28/15 0340 01/28/15 1139 01/29/15 0458 01/30/15 0250  NA 148*  --  147* 150*  K 4.2  --  4.3 4.5  CL 118*  --  119* 121*  CO2 20*  --  21* 18*  BUN 48*  --  51* 52*  CREATININE 2.73*  --  3.03* 2.69*  CALCIUM 8.8*  --  8.8* 8.9  PROT  --  6.9  --   --   BILITOT  --  0.6  --   --   ALKPHOS  --  102  --   --   ALT  --  20  --   --   AST  --  27  --   --   GLUCOSE 108*  --  152* 127*    Rosemarie Ax, MD 01/30/2015, 9:27 AM PGY-3 [place] Intern pager: [phone], text pages welcome

## 2015-01-30 NOTE — Care Management Important Message (Signed)
Important Message  Patient Details  Name: Brian Burns MRN: [id number] Date of Birth: February 15, 1945   Medicare Important Message Given:  Yes    Cherylann Parr, RN 01/30/2015, 10:33 AM

## 2015-01-30 NOTE — Progress Notes (Signed)
Physical Therapy Treatment Patient Details Name: Brian Burns MRN: [id number] DOB: 17-Feb-1945 Today's Date: 01/30/2015    History of Present Illness Brian Burns is a 70 y.o. male presenting with abdominal pain, weight loss. PMH is significant for urethral strictures, CAD, paroxysmal afib, CKD, HTN, CVA x2, BPH, DM 2, AAA.     PT Comments    Patient progressing slowly towards PT goals. Activity and mobility limited due to fatigue. Improved ambulation distance with encouragement. Increased time between all transitions with mobility. Discussed importance of sitting in chair daily and ambulation to/from bathroom. Will follow acutely per current POC.   Follow Up Recommendations  SNF;Supervision/Assistance - 24 hour     Equipment Recommendations  Rolling walker with 5" wheels    Recommendations for Other Services       Precautions / Restrictions Precautions Precautions: Fall Restrictions Weight Bearing Restrictions: No    Mobility  Bed Mobility Overal bed mobility: Needs Assistance Bed Mobility: Sidelying to Sit   Sidelying to sit: Supervision;HOB elevated       General bed mobility comments: increased time, labored effort, directional v/c's. + dizziness sitting EOB.  Transfers Overall transfer level: Needs assistance Equipment used: Rolling walker (2 wheeled) Transfers: Sit to/from Stand Sit to Stand: Min assist         General transfer comment: Min A to boost from EOB with cues for hand placement/technique. Increased time prior to standing due to dizziness. "give me a minute.." Transferred to chair post ambulation bout.  Ambulation/Gait Ambulation/Gait assistance: Min assist Ambulation Distance (Feet): 30 Feet Assistive device: Rolling walker (2 wheeled) Gait Pattern/deviations: Step-through pattern;Decreased stride length;Trunk flexed Gait velocity: decreased Gait velocity interpretation: <1.8 ft/sec, indicative of risk for recurrent falls General Gait  Details: slow to step, increased trunk flexion, narrow base of support. minA for walker management, directional v/c's. DOE 2/4.   Stairs            Wheelchair Mobility    Modified Rankin (Stroke Patients Only)       Balance Overall balance assessment: Needs assistance Sitting-balance support: Feet supported;No upper extremity supported Sitting balance-Leahy Scale: Fair     Standing balance support: During functional activity Standing balance-Leahy Scale: Poor Standing balance comment: Requires UE support for safety.                     Cognition Arousal/Alertness: Awake/alert Behavior During Therapy: WFL for tasks assessed/performed Overall Cognitive Status: Within Functional Limits for tasks assessed                      Exercises      General Comments General comments (skin integrity, edema, etc.): HR stable during session today.      Pertinent Vitals/Pain Pain Assessment: No/denies pain    Home Living                      Prior Function            PT Goals (current goals can now be found in the care plan section) Progress towards PT goals: Progressing toward goals    Frequency  Min 3X/week    PT Plan Current plan remains appropriate    Co-evaluation             End of Session Equipment Utilized During Treatment: Gait belt Activity Tolerance: Patient limited by fatigue Patient left: in chair;with chair alarm set;with call bell/phone within reach;with family/visitor present     Time:  0802-2336 PT Time Calculation (min) (ACUTE ONLY): 18 min  Charges:  $Gait Training: 8-22 mins                    G Codes:      Brian Burns A Brian Burns 01/30/2015, 2:32 PM Mylo Red, PT, DPT [phone]

## 2015-01-30 NOTE — Progress Notes (Signed)
ANTICOAGULATION CONSULT NOTE - Follow Up Consult  Pharmacy Consult for Coumadin Indication: atrial fibrillation  Allergies  Allergen Reactions  . Novocain [Procaine Hcl] Nausea And Vomiting    Patient Measurements: Height: 6\' 4"  (193 cm) Weight: 173 lb 14.4 oz (78.881 kg) IBW/kg (Calculated) : 86.8   Vital Signs: Temp: 98.7 F (37.1 C) (01/02 0441) Temp Source: Oral (01/02 0441) BP: 140/105 mmHg (01/02 0441) Pulse Rate: 99 (01/02 0441)  Labs:  Recent Labs  01/28/15 0340 01/29/15 0458 01/30/15 0250  HGB 12.5*  --   --   HCT 39.2  --   --   PLT 139*  --   --   LABPROT 38.2* 34.7* 34.1*  INR 4.03* 3.54* 3.46*  CREATININE 2.73* 3.03* 2.69*    Estimated Creatinine Clearance: 29.7 mL/min (by C-G formula based on Cr of 2.69).  Assessment: 69yo male with poor po intake pta and wt loss.  His last Coumadin dose was 12/24.  His INR remains supratherapeutic but trending down slowly to 3.46. He received vitamin K 5 mg on 12/29.  No coumadin given this admission. Hg 12.5, pltc down to 139 as of 12/31. No bleeding reported.   Goal of Therapy:  INR 2-3   Plan:  - No coumadin today - Daily INR  Lysle Pearl, PharmD, BCPS Pager # [phone] 01/30/2015 9:43 AM

## 2015-01-30 NOTE — Care Management Note (Signed)
Case Management Note  Patient Details  Name: Brian Burns MRN: [id number] Date of Birth: April 06, 1945  Subjective/Objective:      Pt admitted for abdominal pain, pancreatitis              Action/Plan:  Pt is from home with wife, daughter provided additional support.  Pt plans to discharge to SNF post nutritional need being met.   Expected Discharge Date:                  Expected Discharge Plan:  Skilled Nursing Facility  In-House Referral:  Clinical Social Work  Discharge planning Services  CM Consult  Post Acute Care Choice:    Choice offered to:     DME Arranged:    DME Agency:     HH Arranged:    [place] Agency:     Status of Service:  In process, will continue to follow  Medicare Important Message Given:  Yes Date Medicare IM Given:    Medicare IM give by:    Date Additional Medicare IM Given:    Additional Medicare Important Message give by:     If discussed at [place] of Stay Meetings, dates discussed:    Additional Comments: CM assessed pt, daughter at bedside.  CM will continue to monitor for disposition needs [name], RN 01/30/2015, 2:52 PM

## 2015-01-30 NOTE — Progress Notes (Signed)
Modified barium swallow shows no oropharyngeal dysphagia issues, but raised the question of esophageal dysphagia. Accordingly, we will order a standard barium swallow with a barium tablet for tomorrow to assess the motility and anatomy of the esophagus.  Over the past 2 days, pt has only consumed about 1/3 of estimated nutritional caloric requirement.  I spoke with the patient's daughter, Lynden Ang [phone]) at the bedside and explained that one option might be supplemental feedings through placement of the gastrostomy tube. She is potentially open to this idea, depending on what the barium swallow shows and how the patient progresses.  Florencia Reasons, M.D. Pager [phone] If no answer or after 5 PM call [phone]

## 2015-01-30 NOTE — Progress Notes (Signed)
Calorie Count Note  48 hour calorie count ordered.  Pt in with MD at time of visit. Calorie count data incomplete; RD estimated intake via documented percent meal intake, meal selections, and MAR for supplement intake.   Per RN notes, pt is eating very little; consuming a few bites off each meal tray. Per MD notes, pt complains of nausea and food having no taste as barriers to eating. Staff also reporting pt is holding food and liquids in his mouth. Pt underwent MBSS today, which recommended continuing with regular texture diet with thin liquids.   Pt is not meeting needs via PO intake. Per previous RD note, potential for PEG placement.   Diet: Heart Healthy Supplements: Ensure Enlive po TID, each supplement provides 350 kcal and 20 grams of protein  Day 1 Breakfast: 0% meal completion Lunch: 292 kcals, 13 grams protein Dinner: 41 kcals, 3 grams protein Supplements: 2 Ensure supplements given, 1 refused per MAR  Day 2 Breakfast: 0% meal completion Lunch: 0% meal completion Dinner: 59 kcals, 3 grams protein Supplements: 1 Ensure Enlive supplement given, 2 refused per MAR (350 kcals, 20 grams protein  Total intake: 721 kcal (34% of minimum estimated needs)  40 protein (36% of minimum estimated needs)  Nutrition Dx: Inadequate oral intake related to altered GI function, poor appetite as evidenced by per patient/family report, ongoing  Goal: Patient will meet greater than or equal to 90% of their needs, unmet  Intervention:   -Continue Ensure Enlive po TID, each supplement provides 350 kcal and 20 grams of protein -Consider TF to assist with pt meeting nutrition goals, recommend: Initiate Osmolite 1.5 @ 20 ml/hr via OGT and increase by 10 ml every 12 hours to goal rate of 55 ml/hr.   30 ml Prostat BID.    Tube feeding regimen provides 2180 kcal (100% of needs), 113 grams of protein, and 1006 ml of H2O.   RD will continue to follow  Laquan Beier A. Mayford Knife, RD, LDN, CDE Pager:  [phone] After hours Pager: [phone]

## 2015-01-30 NOTE — Progress Notes (Signed)
MBSS complete. Full report located under chart review in imaging section.  Patricio Popwell MA, CCC-SLP [phone]   

## 2015-01-30 NOTE — Care Management Note (Signed)
Case Management Note  Patient Details  Name: SABRINA BUXBAUM MRN: [id number] Date of Birth: 1945/04/24  Subjective/Objective:    CM following for progression and d/c planning.                Action/Plan: Pt is independent from home with wife.  Pt is planning on discharging home to SNF.  Pt will have barium swallow test today 01/30/15  Expected Discharge Date:           Expected Discharge Plan:  Skilled Nursing Facility  In-House Referral:  Clinical Social Work  Discharge planning Services  CM Consult  Post Acute Care Choice:    Choice offered to:     DME Arranged:    DME Agency:     HH Arranged:    HH Agency:     Status of Service:  In progress, will continue to follow  Medicare Important Message Given:  Yes Date Medicare IM Given:    Medicare IM give by:    Date Additional Medicare IM Given:    Additional Medicare Important Message give by:     If discussed at Long Length of Stay Meetings, dates discussed:    Additional Comments: CM assessed pt; daughter at bedside.  Daughter and pt acknowledge discharge plan to SNF  Cherylann Parr, RN 01/30/2015, 10:27 AM Case Management Note  Patient Details  Name: TASEAN SEVILLA MRN: [id number] Date of Birth: 06-26-1945  Subjective/Objective:                    Action/Plan:   Expected Discharge Date:                  Expected Discharge Plan:  Skilled Nursing Facility  In-House Referral:  Clinical Social Work  Discharge planning Services  CM Consult  Post Acute Care Choice:    Choice offered to:     DME Arranged:    DME Agency:     HH Arranged:    HH Agency:     Status of Service:  Completed, signed off  Medicare Important Message Given:  Yes Date Medicare IM Given:    Medicare IM give by:    Date Additional Medicare IM Given:    Additional Medicare Important Message give by:     If discussed at Long Length of Stay Meetings, dates discussed:    Additional Comments:  Cherylann Parr,  RN 01/30/2015, 10:26 AM

## 2015-01-31 ENCOUNTER — Inpatient Hospital Stay (HOSPITAL_COMMUNITY): Payer: Commercial Managed Care - HMO

## 2015-01-31 DIAGNOSIS — I6381 Other cerebral infarction due to occlusion or stenosis of small artery: Secondary | ICD-10-CM | POA: Insufficient documentation

## 2015-01-31 DIAGNOSIS — R1310 Dysphagia, unspecified: Secondary | ICD-10-CM | POA: Insufficient documentation

## 2015-01-31 DIAGNOSIS — R531 Weakness: Secondary | ICD-10-CM | POA: Insufficient documentation

## 2015-01-31 DIAGNOSIS — R1319 Other dysphagia: Secondary | ICD-10-CM | POA: Insufficient documentation

## 2015-01-31 HISTORY — DX: Other cerebral infarction due to occlusion or stenosis of small artery: I63.81

## 2015-01-31 LAB — BASIC METABOLIC PANEL
ANION GAP: 11 (ref 5–15)
BUN: 61 mg/dL — ABNORMAL HIGH (ref 6–20)
CHLORIDE: 122 mmol/L — AB (ref 101–111)
CO2: 21 mmol/L — ABNORMAL LOW (ref 22–32)
Calcium: 9.2 mg/dL (ref 8.9–10.3)
Creatinine, Ser: 2.96 mg/dL — ABNORMAL HIGH (ref 0.61–1.24)
GFR calc Af Amer: 24 mL/min — ABNORMAL LOW (ref >60)
GFR, EST NON AFRICAN AMERICAN: 20 mL/min — AB (ref >60)
Glucose, Bld: 128 mg/dL — ABNORMAL HIGH (ref 65–99)
POTASSIUM: 4.6 mmol/L (ref 3.5–5.1)
SODIUM: 154 mmol/L — AB (ref 135–145)

## 2015-01-31 LAB — CBC
HCT: 40.7 % (ref 39.0–52.0)
HEMOGLOBIN: 12.7 g/dL — AB (ref 13.0–17.0)
MCH: 27.1 pg (ref 26.0–34.0)
MCHC: 31.2 g/dL (ref 30.0–36.0)
MCV: 87 fL (ref 78.0–100.0)
PLATELETS: 135 10*3/uL — AB (ref 150–400)
RBC: 4.68 MIL/uL (ref 4.22–5.81)
RDW: 15.5 % (ref 11.5–15.5)
WBC: 10.5 10*3/uL (ref 4.0–10.5)

## 2015-01-31 LAB — PROTIME-INR
INR: 3.18 — ABNORMAL HIGH (ref 0.00–1.49)
Prothrombin Time: 32 seconds — ABNORMAL HIGH (ref 11.6–15.2)

## 2015-01-31 LAB — FRACTIONAL EXCRET-NA(24HR UR +SERUM)
CREATININE: 2.97 mg/dL — AB (ref 0.61–1.24)
Creatinine, Urine: 162.41 mg/dL
Fractional Excretion of Na: 0 %
SODIUM UR: 27 mmol/L
Sodium: 151 mmol/L — ABNORMAL HIGH (ref 135–145)

## 2015-01-31 MED ORDER — DEXTROSE 5 % IV SOLN
INTRAVENOUS | Status: DC
  Administered 2015-01-31 – 2015-02-02 (×4): via INTRAVENOUS

## 2015-01-31 MED ORDER — HYDRALAZINE HCL 20 MG/ML IJ SOLN: 10.0000 mg | Freq: Three times a day (TID) | INTRAMUSCULAR | Status: DC | PRN

## 2015-01-31 NOTE — Progress Notes (Signed)
Family Medicine Teaching Service Daily Progress Note Intern Pager: [phone]  Patient name: Brian Burns Medical record number: [id number] Date of birth: 06-26-1945 Age: 70 y.o. Gender: male  Primary Care Provider: Lavon Paganini, MD Consultants: Nephrology, GI Code Status: DNR  Pt Overview and Major Events to Date:  12/27: admit for abdominal pain 12/28: PT recommended SNF  12/29: [facility] to follow from distance  1/1: SLP evaluation   Assessment and Plan: BRANDN MCGATH is a 70 y.o. male presenting with abdominal pain, weight loss. PMH is significant for urethral strictures, CAD, paroxysmal afib on warfarin, CKD, HTN, CVA x2, BPH, DM 2, AAA.   Weight loss, moderate malnutrition in context of chronic illness:  Lipase is improving (338->195). Has been elevated for the past month.  - SLP rec's: MBS   - Barium swallow test. Then G tube placement based on finding - Continue Remeron to stimulate appetite - Nutrition consulted, appreciate assistance - PT/OT recommend SNF placement. SW already met with patient, patient's wife and patient's daughter. - Working with physical therapy  Hx of CVA's: having word finding. Having some balance problems with ongoing swallowing issues. CT head with atrophy with small vessel chronic ischemic changes of deep cerebral white matter, and old infarcts in cerebellum and BG. Gait issue could also be due to malnutrition. Doubt vit B12 def with MCV of 87%. Not sure if there is a utility to MRI at this point. Neuro exam benign.  -Continue physical therapy and nutrition  Dehydration: possibly contributing to electrolyte abnormalities. - phenergan 12.14m IV q6hrs for nausea - encouraged to drink fluids throughout the day  Hypernatremia, hyperchloremia: Na 154, Cl 122. Patient not taking good by mouth. Free water deficit 4.74L. - D5W at 1277mhr over the next 35 hours. - Trend BMP - Continue to encourage PO  Constipation: achieved bowel movement on 1/1  with soap suds enema. Reports having BM on 01/02. - continue colace  AKI on CKD3: secondary due bladder obstruction most likely. Slowly improving. 2.96 this morning. - Monitor BMP  BPH: apparently hasn't been taking finasteride or flomax.  - continue foley - monitor UOP - will need urology follow up as an outpatient  Paroxysmal afib: in NSR currently, anticoagulated with warfarin, INR supra-therapeutic to 5.5 12/28, 3.18 this morning - follow INR - warfarin per pharm, but consider alternative anticoagulation like Eliquis before discharge  CAD: stable  - Continue home atorvastatin and aspirin  Left lung nodule: extensive smoking history. Observed on CT on 12/27 - repeat CT scan in 1 year   FEN/GI: heart healthy, KVO D5 + Free water   Prophylaxis: warfarin  Disposition: Discharge to SNF when medically stable  Subjective:  No acute event over night. Denies pain. Still no appetite. Has BM yesterday.  Objective: Temp:  [97.5 F (36.4 C)-98.2 F (36.8 C)] 98.2 F (36.8 C) (01/03 0533) Pulse Rate:  [90-104] 103 (01/03 0533) Resp:  [18-19] 19 (01/03 0533) BP: (132-150)/(96-121) 132/109 mmHg (01/03 0533) SpO2:  [99 %-100 %] 99 % (01/03 0533)   Physical Exam: General: lying in bed, appears weak, no distress Cardiovascular: RRR, no murmurs Respiratory: CTAB, no wheezes Abdomen: soft, BS+, tender to palpation over LUQ Extremities: no LE edema or tenderness Skin: Tenting skin Neuro: AAO to person & place. Cranial nerves grossly intact. No motor deficits or pronator drift.   Laboratory:  Recent Labs Lab 01/27/15 0259 01/28/15 0340 01/31/15 0247  WBC 8.4 9.5 10.5  HGB 12.2* 12.5* 12.7*  HCT 38.3* 39.2 40.7  PLT 142* 139* 135*    Recent Labs Lab 01/28/15 1139 01/29/15 0458 01/30/15 0250 01/30/15 1214 01/31/15 0247  NA  --  147* 150* 151* 154*  K  --  4.3 4.5  --  4.6  CL  --  119* 121*  --  122*  CO2  --  21* 18*  --  21*  BUN  --  51* 52*  --  61*   CREATININE  --  3.03* 2.69* 2.97* 2.96*  CALCIUM  --  8.8* 8.9  --  9.2  PROT 6.9  --   --   --   --   BILITOT 0.6  --   --   --   --   ALKPHOS 102  --   --   --   --   ALT 20  --   --   --   --   AST 27  --   --   --   --   GLUCOSE  --  152* 127*  --  128*    Mercy Riding, MD 01/31/2015, 10:24 AM PGY-1 [place] Intern pager: [phone], text pages welcome

## 2015-01-31 NOTE — Progress Notes (Signed)
UR Completed. Shaneil Yazdi, RN, BSN.  [phone] 

## 2015-01-31 NOTE — Progress Notes (Signed)
Rechecked Pt BP after treating nausea with IV phenergan.  BP WNL at this time.  Will cont to monitor.

## 2015-01-31 NOTE — Progress Notes (Signed)
ANTICOAGULATION CONSULT NOTE - Follow Up Consult  Pharmacy Consult for Coumadin Indication: atrial fibrillation  Allergies  Allergen Reactions  . Novocain [Procaine Hcl] Nausea And Vomiting    Patient Measurements: Height: 6\' 4"  (193 cm) Weight: 173 lb 14.4 oz (78.881 kg) IBW/kg (Calculated) : 86.8   Vital Signs: Temp: 98.2 F (36.8 C) (01/03 0533) Temp Source: Oral (01/03 0533) BP: 132/109 mmHg (01/03 0533) Pulse Rate: 103 (01/03 0533)  Labs:  Recent Labs  01/29/15 0458 01/30/15 0250 01/30/15 1214 01/31/15 0247  HGB  --   --   --  12.7*  HCT  --   --   --  40.7  PLT  --   --   --  135*  LABPROT 34.7* 34.1*  --  32.0*  INR 3.54* 3.46*  --  3.18*  CREATININE 3.03* 2.69* 2.97* 2.96*    Estimated Creatinine Clearance: 27 mL/min (by C-G formula based on Cr of 2.96).  Assessment: 70yo male with poor po intake pta and wt loss.  His last Coumadin dose was 12/24.  His INR remains supratherapeutic but trending down slowly to 3.18. He received vitamin K 5 mg on 12/29.  No coumadin given this admission. Hg 12.7, pltc remain low at 135. No bleeding reported.   Goal of Therapy:  INR 2-3   Plan:  - No coumadin today - Daily INR  Lysle Pearl, PharmD, BCPS Pager # [phone] 01/31/2015 9:01 AM

## 2015-02-01 ENCOUNTER — Inpatient Hospital Stay (HOSPITAL_COMMUNITY): Payer: Commercial Managed Care - HMO

## 2015-02-01 DIAGNOSIS — E878 Other disorders of electrolyte and fluid balance, not elsewhere classified: Secondary | ICD-10-CM | POA: Insufficient documentation

## 2015-02-01 LAB — RPR: RPR Ser Ql: NONREACTIVE

## 2015-02-01 LAB — BASIC METABOLIC PANEL
Anion gap: 8 (ref 5–15)
BUN: 63 mg/dL — AB (ref 6–20)
CALCIUM: 8.6 mg/dL — AB (ref 8.9–10.3)
CO2: 19 mmol/L — ABNORMAL LOW (ref 22–32)
CREATININE: 3.28 mg/dL — AB (ref 0.61–1.24)
Chloride: 121 mmol/L — ABNORMAL HIGH (ref 101–111)
GFR calc Af Amer: 21 mL/min — ABNORMAL LOW (ref >60)
GFR, EST NON AFRICAN AMERICAN: 18 mL/min — AB (ref >60)
GLUCOSE: 174 mg/dL — AB (ref 65–99)
POTASSIUM: 4 mmol/L (ref 3.5–5.1)
SODIUM: 148 mmol/L — AB (ref 135–145)

## 2015-02-01 LAB — PROTIME-INR
INR: 3.08 — AB (ref 0.00–1.49)
PROTHROMBIN TIME: 31.2 s — AB (ref 11.6–15.2)

## 2015-02-01 LAB — HIV ANTIBODY (ROUTINE TESTING W REFLEX): HIV SCREEN 4TH GENERATION: NONREACTIVE

## 2015-02-01 MED ORDER — SODIUM CHLORIDE 0.9 % IV SOLN
INTRAVENOUS | Status: DC
  Administered 2015-02-02: 14:00:00 via INTRAVENOUS

## 2015-02-01 NOTE — Progress Notes (Signed)
[facility] Progress Note  Subjective: Patient still complaining of dysphagia. Barium swallow poor quality due to vomiting. Note he has had a food impaction requiring endoscopy in the past back in April. Objective: Vital signs in last 24 hours: Temp:  [97.2 F (36.2 C)-98.1 F (36.7 C)] 97.2 F (36.2 C) (01/04 1526) Pulse Rate:  [58-95] 85 (01/04 1526) Resp:  [16-18] 16 (01/04 1526) BP: (109-129)/(85-94) 109/87 mmHg (01/04 1526) SpO2:  [100 %] 100 % (01/04 1526) Weight change:    PE: Unchanged  Lab Results: Results for orders placed or performed during the hospital encounter of 01/24/15 (from the past 24 hour(s))  HIV antibody     Status: None   Collection Time: 01/31/15  5:20 PM  Result Value Ref Range   HIV Screen 4th Generation wRfx Non Reactive Non Reactive  RPR     Status: None   Collection Time: 01/31/15  5:20 PM  Result Value Ref Range   RPR Ser Ql Non Reactive Non Reactive  Protime-INR     Status: Abnormal   Collection Time: 02/01/15  4:07 AM  Result Value Ref Range   Prothrombin Time 31.2 (H) 11.6 - 15.2 seconds   INR 3.08 (H) 0.00 - 1.49  Basic metabolic panel     Status: Abnormal   Collection Time: 02/01/15  4:07 AM  Result Value Ref Range   Sodium 148 (H) 135 - 145 mmol/L   Potassium 4.0 3.5 - 5.1 mmol/L   Chloride 121 (H) 101 - 111 mmol/L   CO2 19 (L) 22 - 32 mmol/L   Glucose, Bld 174 (H) 65 - 99 mg/dL   BUN 63 (H) 6 - 20 mg/dL   Creatinine, Ser 4.81 (H) 0.61 - 1.24 mg/dL   Calcium 8.6 (L) 8.9 - 10.3 mg/dL   GFR calc non Af Amer 18 (L) >60 mL/min   GFR calc Af Amer 21 (L) >60 mL/min   Anion gap 8 5 - 15    Studies/Results: Mr Brain Wo Contrast  01/31/2015  CLINICAL DATA:  70 year old hypertensive diabetic male with difficulty finding words, balance problems and swallowing issues. Subsequent encounter. EXAM: MRI HEAD WITHOUT CONTRAST TECHNIQUE: Multiplanar, multiecho pulse sequences of the brain and surrounding structures were obtained without  intravenous contrast. COMPARISON:  01/24/2015 CT.  03/17/2013 MR. FINDINGS: Exam is motion degraded. No acute infarct. Remote basal ganglia, pontine and right cerebellar infarcts. Moderate small vessel disease type changes. Global atrophy without hydrocephalus. No intracranial mass lesion noted on this unenhanced exam. Major intracranial vascular structures are patent. Post lens replacement. Cervical medullary junction, pituitary region and pineal region unremarkable. IMPRESSION: Exam is motion degraded. No acute infarct. Remote basal ganglia, pontine and right cerebellar infarcts. Moderate small vessel disease type changes. Global atrophy without hydrocephalus. Electronically Signed   By: Lacy Duverney M.D.   On: 01/31/2015 19:43   Dg Esophagus  02/01/2015  CLINICAL DATA:  Difficulty swallowing.  Dysphagia. EXAM: ESOPHOGRAM/BARIUM SWALLOW TECHNIQUE: Single contrast examination was performed using  thin barium. FLUOROSCOPY TIME:  Fluoroscopy Time:  6 minutes and 27 seconds Number of Acquired Images:  None COMPARISON:  CT of 01/24/2015 FINDINGS: Due to the patient's clinical status, a focused, limited exam was performed/ attempted in an LPO position. Patient was unable to take large swallows and upon taking only a few small swallows, developed extensive emesis. Given this limitation, esophageal motility could not be directly evaluated. There is contrast stasis throughout the esophagus, suggesting a component of dysmotility. The upper and midesophagus are mildly  dilated. Most apparent on series 36, is an area of persistent moderate narrowing at the gastroesophageal junction. This corresponds to moderate wall thickening of the distal esophagus, including on image 45 of the 01/24/2015 CT. Eventual passage of contrast past this level with upright positioning. IMPRESSION: 1. Moderate to severely degraded exam, as detailed above, secondary to patient clinical status and inability to take multiple swallows. 2. Proximal  and mid esophageal dilatation with an area of moderate underdistention/narrowing at the gastroesophageal junction. Suspicious for either peptic stricture or less likely malignancy. Further evaluation with endoscopy should be considered. 3. Although esophageal motility is suboptimally evaluated, dysmotility is suggested, likely related to presbyesophagus. Electronically Signed   By: Jeronimo Greaves M.D.   On: 02/01/2015 09:59      Assessment: Recurrent dysphagia unclear whether structural or functional obstruction of the distal esophagus.  Plan: At this point will go ahead with EGD with planned esophageal dilatation. This was scheduled for tomorrow.    Mete Purdum C 02/01/2015, 3:28 PM  Pager [phone] If no answer or after 5 PM call [phone]

## 2015-02-01 NOTE — Progress Notes (Signed)
PT Cancellation Note  Patient Details Name: Brian Burns MRN: [id number] DOB: 07/17/1945   Cancelled Treatment:    Reason Eval/Treat Not Completed: Patient declined, no reason specified Pt declines working with therapy this PM despite education on importance of mobility and getting out of bed. "I just don't feel like it." Will f/u.   Blake Divine A Bernarda Erck 02/01/2015, 3:42 PM  Mylo Red, PT, DPT [phone]

## 2015-02-01 NOTE — Progress Notes (Signed)
Patient refused all po intake including ensure drink ,

## 2015-02-01 NOTE — Progress Notes (Signed)
ANTICOAGULATION CONSULT NOTE - Follow Up Consult  Pharmacy Consult for Coumadin Indication: atrial fibrillation  Allergies  Allergen Reactions  . Novocain [Procaine Hcl] Nausea And Vomiting    Patient Measurements: Height: 6\' 4"  (193 cm) Weight: 173 lb 14.4 oz (78.881 kg) IBW/kg (Calculated) : 86.8   Vital Signs:    Labs:  Recent Labs  01/30/15 0250 01/30/15 1214 01/31/15 0247 02/01/15 0407  HGB  --   --  12.7*  --   HCT  --   --  40.7  --   PLT  --   --  135*  --   LABPROT 34.1*  --  32.0* 31.2*  INR 3.46*  --  3.18* 3.08*  CREATININE 2.69* 2.97* 2.96* 3.28*    Estimated Creatinine Clearance: 24.4 mL/min (by C-G formula based on Cr of 3.28).  Assessment: 70yo male with poor po intake pta and wt loss.  His last Coumadin dose was 12/24 so he has not received any coumadin this admission. His INR remains supratherapeutic but trending down slowly to 3.08. He received vitamin K 5 mg on 12/29.  No coumadin given this admission. Hg 12.7, pltc remain low at 135. No bleeding reported.   Goal of Therapy:  INR 2-3   Plan:  - No coumadin today - Daily INR  Lysle Pearl, PharmD, BCPS Pager # [phone] 02/01/2015 8:26 AM

## 2015-02-01 NOTE — Progress Notes (Signed)
Patient complaining of stomach pain, vomited a small amt. Phenergan given will continue to monitor. Call light within reach.

## 2015-02-01 NOTE — Progress Notes (Signed)
Family Medicine Teaching Service Daily Progress Note Intern Pager: [phone]  Patient name: Brian Burns Medical record number: [id number] Date of birth: October 12, 1945 Age: 70 y.o. Gender: male  Primary Care Provider: Lavon Paganini, MD Consultants: Nephrology, GI Code Status: DNR  Pt Overview and Major Events to Date:  12/27: admit for abdominal pain 12/28: PT recommended SNF  12/29: [facility] to follow from distance  1/1: SLP evaluation  1/3: MRI with no acute CVA 1/4: Barium swallow test  Assessment and Plan: Brian Burns is a 70 y.o. male presenting with abdominal pain, weight loss. PMH is significant for urethral strictures, CAD, paroxysmal afib on warfarin, CKD, HTN, CVA x2, BPH, DM 2, AAA.   Weight loss, moderate malnutrition in context of chronic illness:  Lipase is improving (338->195). Has been elevated for the past month.  - SLP rec's: MBS   - Barium swallow test today. possible G tube placement based on finding - Continue Remeron to stimulate appetite - Nutrition consulted, appreciate assistance - PT/OT recommend SNF placement. SW already met with patient, patient's wife and patient's daughter. - Working with physical therapy  Hx of CVA's: having word finding. Having some balance problems with ongoing swallowing issues. CT head with atrophy with small vessel chronic ischemic changes of deep cerebral white matter, and old infarcts in cerebellum and BG. Gait issue could also be due to malnutrition. Doubt vit B12 def with MCV of 87%.  MRI 01/03 with signs of chronic infarct but no acute intracranial process.  -Continue physical therapy and nutrition  Dehydration: possibly contributing to electrolyte abnormalities. - phenergan 12.32m IV q6hrs for nausea - encouraged to drink fluids throughout the day  Hypernatremia, hyperchloremia: improved. Na 148 and Cl 121. Patient not taking good by mouth. Free water deficit 2.7L today. - D5W at 1273mhr over the next 21 hours. -  Trend BMP - Continue to encourage PO  Hyperkalemia: resolved. k 4.0 this morning. -will monitor  Constipation: achieved bowel movement on 1/1 with soap suds enema.  - continue colace - soap sud enema as needed   AKI on CKD3: 4.12 on admission. Up to 3.28 from 2.69 over the last 24 hrs.  - Monitor BMP - Outtpt nephrology  BPH: apparently hasn't been taking finasteride or flomax.  - continue foley - monitor UOP - will need urology follow up as an outpatient  Paroxysmal afib: in NSR currently, anticoagulated with warfarin, INR supra-therapeutic to 5.5 12/28, 3.08 this morning - follow INR - warfarin per pharm, but consider alternative anticoagulation like Eliquis before discharge  CAD: stable  - Continue home atorvastatin and aspirin  Left lung nodule: extensive smoking history. Observed on CT on 12/27 - repeat CT scan in 1 year   FEN/GI:  -D5 + Free water  -Heart healthy    Prophylaxis: INR supra therapeutic. Warfarin per pharm  Disposition: Discharge to SNF when medically stable  Subjective:  No acute event over night. Endorses achy left sided abdominal pain. Still poor appetite. Daughter says he hasn't had BM since 01/01.  Objective: Temp:  [98.1 F (36.7 C)-98.6 F (37 C)] 98.1 F (36.7 C) (01/03 1946) Pulse Rate:  [58-98] 95 (01/03 1946) Resp:  [18] 18 (01/03 1946) BP: (112-145)/(85-114) 129/94 mmHg (01/03 1946) SpO2:  [100 %] 100 % (01/03 1946)   Physical Exam: General: lying in bed, appears weak, no distress Cardiovascular: RRR, no murmurs Respiratory: CTAB, no wheezes Abdomen: soft, BS+, tender to palpation over LUQ Extremities: no LE edema or tenderness Skin: Tenting  skin Neuro: AAO to person & place. Cranial nerves grossly intact. No motor deficits or pronator drift.   Laboratory:  Recent Labs Lab 01/27/15 0259 01/28/15 0340 01/31/15 0247  WBC 8.4 9.5 10.5  HGB 12.2* 12.5* 12.7*  HCT 38.3* 39.2 40.7  PLT 142* 139* 135*    Recent  Labs Lab 01/28/15 1139  01/30/15 0250 01/30/15 1214 01/31/15 0247 02/01/15 0407  NA  --   < > 150* 151* 154* 148*  K  --   < > 4.5  --  4.6 4.0  CL  --   < > 121*  --  122* 121*  CO2  --   < > 18*  --  21* 19*  BUN  --   < > 52*  --  61* 63*  CREATININE  --   < > 2.69* 2.97* 2.96* 3.28*  CALCIUM  --   < > 8.9  --  9.2 8.6*  PROT 6.9  --   --   --   --   --   BILITOT 0.6  --   --   --   --   --   ALKPHOS 102  --   --   --   --   --   ALT 20  --   --   --   --   --   AST 27  --   --   --   --   --   GLUCOSE  --   < > 127*  --  128* 174*  < > = values in this interval not displayed.  Mercy Riding, MD 02/01/2015, 7:42 AM PGY-1 [place] Intern pager: [phone], text pages welcome

## 2015-02-02 ENCOUNTER — Inpatient Hospital Stay (HOSPITAL_COMMUNITY): Payer: Commercial Managed Care - HMO | Admitting: Anesthesiology

## 2015-02-02 ENCOUNTER — Encounter (HOSPITAL_COMMUNITY): Payer: Self-pay

## 2015-02-02 ENCOUNTER — Encounter (HOSPITAL_COMMUNITY)
Admission: EM | Disposition: A | Discharge status: Skilled Nursing Facility | Payer: Commercial Managed Care - HMO | Source: Home / Self Care | Attending: Family Medicine

## 2015-02-02 DIAGNOSIS — R1310 Dysphagia, unspecified: Secondary | ICD-10-CM

## 2015-02-02 HISTORY — PX: ESOPHAGOGASTRODUODENOSCOPY: SHX5428

## 2015-02-02 LAB — BASIC METABOLIC PANEL
Anion gap: 9 (ref 5–15)
BUN: 54 mg/dL — AB (ref 6–20)
CHLORIDE: 117 mmol/L — AB (ref 101–111)
CO2: 20 mmol/L — ABNORMAL LOW (ref 22–32)
CREATININE: 2.8 mg/dL — AB (ref 0.61–1.24)
Calcium: 8.3 mg/dL — ABNORMAL LOW (ref 8.9–10.3)
GFR calc Af Amer: 25 mL/min — ABNORMAL LOW (ref >60)
GFR calc non Af Amer: 22 mL/min — ABNORMAL LOW (ref >60)
GLUCOSE: 156 mg/dL — AB (ref 65–99)
POTASSIUM: 3.8 mmol/L (ref 3.5–5.1)
SODIUM: 146 mmol/L — AB (ref 135–145)

## 2015-02-02 LAB — PROTIME-INR
INR: 2.76 — ABNORMAL HIGH (ref 0.00–1.49)
Prothrombin Time: 28.7 seconds — ABNORMAL HIGH (ref 11.6–15.2)

## 2015-02-02 SURGERY — EGD (ESOPHAGOGASTRODUODENOSCOPY)
Anesthesia: General | Laterality: Left

## 2015-02-02 MED ORDER — FENTANYL CITRATE (PF) 100 MCG/2ML IJ SOLN
INTRAMUSCULAR | Status: DC | PRN
  Administered 2015-02-02: 50 ug via INTRAVENOUS

## 2015-02-02 MED ORDER — PHENYLEPHRINE HCL 10 MG/ML IJ SOLN
10.0000 mg | INTRAVENOUS | Status: DC | PRN
  Administered 2015-02-02: 10 ug/min via INTRAVENOUS

## 2015-02-02 MED ORDER — WARFARIN SODIUM 1 MG PO TABS
1.0000 mg | ORAL_TABLET | Freq: Once | ORAL | Status: AC
  Administered 2015-02-02: 1 mg via ORAL
  Filled 2015-02-02: qty 1

## 2015-02-02 MED ORDER — SUCCINYLCHOLINE CHLORIDE 20 MG/ML IJ SOLN
INTRAMUSCULAR | Status: DC | PRN
  Administered 2015-02-02: 120 mg via INTRAVENOUS

## 2015-02-02 MED ORDER — PROPOFOL 10 MG/ML IV BOLUS
INTRAVENOUS | Status: DC | PRN
  Administered 2015-02-02: 140 mg via INTRAVENOUS

## 2015-02-02 MED ORDER — LIDOCAINE HCL (CARDIAC) 20 MG/ML IV SOLN
INTRAVENOUS | Status: DC | PRN
  Administered 2015-02-02: 20 mg via INTRAVENOUS

## 2015-02-02 MED ORDER — DEXTROSE 5 % IV SOLN
INTRAVENOUS | Status: AC
  Administered 2015-02-02 (×2): via INTRAVENOUS

## 2015-02-02 MED ORDER — ACETAMINOPHEN 325 MG PO TABS
650.0000 mg | ORAL_TABLET | Freq: Four times a day (QID) | ORAL | Status: DC | PRN
  Administered 2015-02-03 – 2015-02-08 (×4): 650 mg via ORAL
  Filled 2015-02-02 (×6): qty 2

## 2015-02-02 MED ORDER — EPHEDRINE SULFATE 50 MG/ML IJ SOLN
INTRAMUSCULAR | Status: DC | PRN
  Administered 2015-02-02 (×2): 10 mg via INTRAVENOUS

## 2015-02-02 NOTE — Progress Notes (Signed)
[facility] completed paperwork with pt and daughter this morning for impending DC- facility will be able to accept pt when medically stable  DNR on chart to be signed  CSW will continue to follow  Merlyn Lot, [facility] Clinical Social Worker [phone]

## 2015-02-02 NOTE — Transfer of Care (Signed)
Immediate Anesthesia Transfer of Care Note  Patient: Brian Burns  Procedure(s) Performed: Procedure(s): ESOPHAGOGASTRODUODENOSCOPY (EGD) (Left)  Patient Location: Endoscopy Unit  Anesthesia Type:General  Level of Consciousness: awake, alert  and sedated  Airway & Oxygen Therapy: Patient connected to face mask oxygen  Post-op Assessment: Report given to RN  Post vital signs: stable  Last Vitals:  Filed Vitals:   02/02/15 0508 02/02/15 1207  BP: 128/83 132/90  Pulse: 89 70  Temp: 36.4 C 36.5 C  Resp: 17 12    Complications: No apparent anesthesia complications

## 2015-02-02 NOTE — Progress Notes (Signed)
PT Cancellation Note  Patient Details Name: KHARON SCHRAER MRN: [id number] DOB: 01-30-1945   Cancelled Treatment:    Reason Eval/Treat Not Completed: Patient declined, no reason specified Pt reports his stomach is hurting and he does not feel like working with therapy today. Going down for EGD. Will follow up.   Blake Divine A Jemari Hallum 02/02/2015, 10:39 AM  Mylo Red, PT, DPT [phone]

## 2015-02-02 NOTE — Progress Notes (Signed)
Family Medicine Teaching Service Daily Progress Note Intern Pager: [phone]  Patient name: Brian Burns Medical record number: [id number] Date of birth: December 19, 1945 Age: 70 y.o. Gender: male  Primary Care Provider: Lavon Paganini, MD Consultants: Nephrology, GI Code Status: DNR  Pt Overview and Major Events to Date:  12/27: admit for abdominal pain 12/28: PT recommended SNF  12/29: [facility] to follow from distance  1/1: SLP evaluation  1/3: MRI with no acute CVA 1/4: Barium swallow test  Assessment and Plan: LANDIS DOWDY is a 70 y.o. male presenting with abdominal pain, weight loss. PMH is significant for urethral strictures, CAD, paroxysmal afib on warfarin, CKD, HTN, CVA x2, BPH, DM 2, AAA.   Weight loss, moderate malnutrition in context of chronic illness: no improvement. Lipase is improving (338->195). Has been elevated for the past month.  - SLP rec's: MBS   - Barium swallow on 01/04 concerning for narrowing of GE junction/dysmotility but poor quality due to vomiting.  - EGD today per GI - Continue Remeron to stimulate appetite - Nutrition consulted, appreciate assistance - PT/OT recommend SNF placement. Refused physical therapy on 01/04. SW already met with patient, patient's wife and patient's daughter.  Hx of CVA's: having word finding. Having some balance problems with ongoing swallowing issues. CT head with atrophy with small vessel chronic ischemic changes of deep cerebral white matter, and old infarcts in cerebellum and BG. Gait issue could also be due to malnutrition. Doubt vit B12 def with MCV of 87%.  MRI 01/03 with signs of chronic infarct but no acute intracranial process.  -Continue physical therapy and nutrition  Dehydration: possibly contributing to electrolyte abnormalities. - phenergan 12.50m IV q6hrs for nausea - encouraged to drink fluids throughout the day  Hypernatremia, hyperchloremia: improving. Na 146 and Cl 117. Patient not taking good by  mouth. Free water deficit 2 L today. - D5W at 1233mhr over the next 16 hours. - Trend BMP - Continue to encourage PO  Hyperkalemia: resolved. k 4.0 this morning. -will monitor  Constipation: achieved bowel movement on 1/1 with soap suds enema.  - continue colace - soap sud enema as needed   AKI on CKD3: improving. 4.12 on admission. 2.8 this morning from 3.28 on 01/04.  - Monitor BMP - Outtpt nephrology  BPH: apparently hasn't been taking finasteride or flomax.  - continue foley - monitor UOP - will need urology follow up as an outpatient  Paroxysmal afib: in NSR currently, anticoagulated with warfarin, INR supra-therapeutic to 5.5 on 12/28. INR 2.76 today - follow INR - warfarin per pharm  CAD: stable  - Continue home atorvastatin and aspirin  Left lung nodule: extensive smoking history. Observed on CT on 12/27 - repeat CT scan in 1 year   FEN/GI:  -D5 + Free water  -Heart healthy    Prophylaxis: INR therapeutic today.  -Warfarin per pharm  Disposition: Discharge to SNF when medically stable  Subjective:  No acute event over night. Reports indigestion. Still poor appetite but NPO for EGD.   Objective: Temp:  [97.2 F (36.2 C)-97.9 F (36.6 C)] 97.5 F (36.4 C) (01/05 0508) Pulse Rate:  [83-89] 89 (01/05 0508) Resp:  [16-18] 17 (01/05 0508) BP: (109-128)/(83-90) 128/83 mmHg (01/05 0508) SpO2:  [98 %-100 %] 100 % (01/05 0508)   Physical Exam: General: lying in bed, appears weak and wasted, no distress Cardiovascular: RRR, no murmurs Respiratory: CTAB, no wheezes Abdomen: soft, BS+, tender to palpation over LUQ Extremities: no LE edema or tenderness Skin:  Tenting skin Neuro: AAO to person & place. Cranial nerves grossly intact. No motor deficits or pronator drift.   Laboratory:  Recent Labs Lab 01/27/15 0259 01/28/15 0340 01/31/15 0247  WBC 8.4 9.5 10.5  HGB 12.2* 12.5* 12.7*  HCT 38.3* 39.2 40.7  PLT 142* 139* 135*    Recent Labs Lab  01/28/15 1139  01/31/15 0247 02/01/15 0407 02/02/15 0351  NA  --   < > 154* 148* 146*  K  --   < > 4.6 4.0 3.8  CL  --   < > 122* 121* 117*  CO2  --   < > 21* 19* 20*  BUN  --   < > 61* 63* 54*  CREATININE  --   < > 2.96* 3.28* 2.80*  CALCIUM  --   < > 9.2 8.6* 8.3*  PROT 6.9  --   --   --   --   BILITOT 0.6  --   --   --   --   ALKPHOS 102  --   --   --   --   ALT 20  --   --   --   --   AST 27  --   --   --   --   GLUCOSE  --   < > 128* 174* 156*  < > = values in this interval not displayed.  Mercy Riding, MD 02/02/2015, 6:50 AM PGY-1 [place] Intern pager: [phone], text pages welcome

## 2015-02-02 NOTE — Anesthesia Postprocedure Evaluation (Signed)
Anesthesia Post Note  Patient: Brian Burns  Procedure(s) Performed: Procedure(s) (LRB): ESOPHAGOGASTRODUODENOSCOPY (EGD) (Left)  Patient location during evaluation: PACU Anesthesia Type: General Level of consciousness: awake and awake and alert Pain management: pain level controlled Vital Signs Assessment: post-procedure vital signs reviewed and stable Respiratory status: spontaneous breathing Cardiovascular status: blood pressure returned to baseline Anesthetic complications: no    Last Vitals:  Filed Vitals:   02/02/15 1450 02/02/15 1533  BP: 144/113 124/99  Pulse: 88 82  Temp:  36.5 C  Resp: 17 18    Last Pain:  Filed Vitals:   02/02/15 1533  PainSc: 2                  Eyla Tallon COKER

## 2015-02-02 NOTE — Op Note (Signed)
[facility] [address] [place], [address]   ENDOSCOPY PROCEDURE REPORT  PATIENT: Taimur, Feicht  MR#: [id number] BIRTHDATE: April 10, 1945 , 69  yrs. old GENDER: male ENDOSCOPIST: Dorena Cookey, MD REFERRED BY: PROCEDURE DATE:  February 09, 2015 PROCEDURE: ASA CLASS: INDICATIONS:  dysphagia and abnormal barium swallow MEDICATIONS: general anesthesia TOPICAL ANESTHETIC: none  DESCRIPTION OF PROCEDURE: After the risks benefits and alternatives of the procedure were thoroughly explained, informed consent was obtained.  The Pentax Gastroscope [id number] endoscope was introduced through the mouth and advanced to the second portion of the duodenum , Without limitations.  The instrument was slowly withdrawn as the mucosa was fully examined. Estimated blood loss is zero unless otherwise noted in this procedure report.    normal esophagus stomach and duodenum. A 16 mm Savary dilator was passed over the guidewire with minimal resistance and no blood seen on withdrawal.              The scope was then withdrawn from the patient and the procedure completed.  COMPLICATIONS: There were no immediate complications.  ENDOSCOPIC IMPRESSION: normal endoscopy, status post empiric esophageal dilatation. Cannot rule out achalasia based on barium swallow and symptomatology.   RECOMMENDATIONS: advance diet and observe response to dilatation. If symptoms persist, formal esophageal manometry.  REPEAT EXAM:  eSigned:  Dorena Cookey, MD February 09, 2015 2:15 PM    CC:  CPT CODES: ICD CODES:  The ICD and CPT codes recommended by this software are interpretations from the data that the clinical staff has captured with the software.  The verification of the translation of this report to the ICD and CPT codes and modifiers is the sole responsibility of the health care institution and practicing physician where this report was generated.  [employer] will not be  held responsible for the validity of the ICD and CPT codes included on this report.  AMA assumes no liability for data contained or not contained herein. CPT is a Publishing rights manager of the [employer].

## 2015-02-02 NOTE — Progress Notes (Signed)
Pt received from endoscopy. Pt comfortable in bed, call light within reach. Daughter at bedside.  Leonidas Romberg, RN

## 2015-02-02 NOTE — Progress Notes (Signed)
ANTICOAGULATION CONSULT NOTE - Follow Up Consult  Pharmacy Consult for Coumadin Indication: atrial fibrillation  Allergies  Allergen Reactions  . Novocain [Procaine Hcl] Nausea And Vomiting    Patient Measurements: Height: 6\' 4"  (193 cm) Weight: 173 lb 14.4 oz (78.881 kg) IBW/kg (Calculated) : 86.8   Vital Signs: Temp: 97.5 F (36.4 C) (01/05 0508) Temp Source: Oral (01/05 0508) BP: 128/83 mmHg (01/05 0508) Pulse Rate: 89 (01/05 0508)  Labs:  Recent Labs  01/31/15 0247 02/01/15 0407 02/02/15 0351  HGB 12.7*  --   --   HCT 40.7  --   --   PLT 135*  --   --   LABPROT 32.0* 31.2* 28.7*  INR 3.18* 3.08* 2.76*  CREATININE 2.96* 3.28* 2.80*    Estimated Creatinine Clearance: 28.6 mL/min (by C-G formula based on Cr of 2.8).  Assessment: 70yo male with poor po intake pta and wt loss. His last Coumadin dose was 12/24 so he has not received any coumadin this admission. His INR has now dropped to <3 at 2.76 today. No bleeding noted. He received vitamin K 5 mg on 12/29. Will attempt to restart coumadin today at a low dose but unclear how he will respond since he has been without coumadin for so long.   Goal of Therapy:  INR 2-3   Plan:  - Give low dose warfarin 1mg  PO x 1 tonight if pt is able to take POs at that time - Daily INR  Lysle Pearl, PharmD, BCPS Pager # [phone] 02/02/2015 11:06 AM

## 2015-02-02 NOTE — Progress Notes (Signed)
Nutrition Follow Up  DOCUMENTATION CODES:   Non-severe (moderate) malnutrition in context of chronic illness  INTERVENTION:   Advance diet as medically appropriate, RD to order Boost Breeze when/as able  NEW NUTRITION DIAGNOSIS:   Inadequate oral intake related to altered GI function, poor appetite as evidenced by per patient/family report, ongoing  GOAL:   Patient will meet greater than or equal to 90% of their needs, unmet  MONITOR:   PO intake, Supplement acceptance, Labs, Weight trends, I & O's  ASSESSMENT:   70 yo Male with PMH significant for urethral strictures, CAD, paroxysmal afib, CKD, HTN, CVA x2, BPH, DM 2, AAA; presented with abdominal pain and weight loss.  Calorie count completed 1/2.  Barium swallow completed 1/4 -- concerning for GE junction/dysmotility but poor quality due to vomiting.  This RD briefly spoke with pt's daughter at bedside 1/5.  Pt currently NPO for procedure.  Plan is for EGD with esophageal dilatation.  Daughter stated she wants to avoid PEG tube placement.  Pt isn't drinking very much of his Ensure Enlive supplements.  Willing to try Boost Breeze (peach flavor).  Will order when/as able.  Diet Order:  Diet NPO time specified  Skin:  Reviewed, no issues  Last BM:  1/1  Height:   Ht Readings from Last 1 Encounters:  01/24/15 6\' 4"  (1.93 m)    Weight:   Wt Readings from Last 1 Encounters:  01/24/15 173 lb 14.4 oz (78.881 kg)    Ideal Body Weight:  91.8 kg  BMI:  Body mass index is 21.18 kg/(m^2).  Estimated Nutritional Needs:   Kcal:  2100-2300  Protein:  110-120 gm  Fluid:  2.1-2.3 L  EDUCATION NEEDS:   No education needs identified at this time  Maureen Chatters, RD, LDN Pager #: [phone] After-Hours Pager #: [phone]

## 2015-02-02 NOTE — Anesthesia Procedure Notes (Signed)
Procedure Name: Intubation Date/Time: 02/02/2015 1:46 PM Performed by: Dorie Rank Pre-anesthesia Checklist: Patient identified, Emergency Drugs available, Suction available, Patient being monitored and Timeout performed Patient Re-evaluated:Patient Re-evaluated prior to inductionOxygen Delivery Method: Circle system utilized Intubation Type: IV induction and Rapid sequence Laryngoscope Size: Mac and 4 Grade View: Grade I Tube type: Oral Tube size: 7.5 mm Number of attempts: 1 Airway Equipment and Method: Stylet Placement Confirmation: ETT inserted through vocal cords under direct vision,  positive ETCO2 and breath sounds checked- equal and bilateral Secured at: 22 cm Tube secured with: Tape Dental Injury: Teeth and Oropharynx as per pre-operative assessment

## 2015-02-02 NOTE — Anesthesia Preprocedure Evaluation (Addendum)
Anesthesia Evaluation  Patient identified by MRN, date of birth, ID band Patient awake    Reviewed: Allergy & Precautions, NPO status , Patient's Chart, lab work & pertinent test results  Airway Mallampati: II  TM Distance: >3 FB Neck ROM: Full    Dental no notable dental hx.    Pulmonary neg pulmonary ROS, former smoker,    Pulmonary exam normal breath sounds clear to auscultation       Cardiovascular hypertension, Pt. on medications + Peripheral Vascular Disease  Normal cardiovascular exam Rhythm:Regular Rate:Normal  Atrial flutter with rapid ventricular response (HCC) 01/16/2015 Left ventricle: Abnormal septal motion The cavity size was mildly dilated. Wall thickness was increased in a pattern of mild LVH. Systolic function was normal. The estimated ejection fraction was in the range of 50% to 55%. Wall motion was normal; there were no regional wall motion abnormalities. - Aortic valve: There was trivial regurgitation. - Impressions: Cystic liver disease noted on subcostal images     Neuro/Psych CVA negative psych ROS   GI/Hepatic negative GI ROS, Neg liver ROS,   Endo/Other  diabetes  Renal/GU negative Renal ROS  negative genitourinary   Musculoskeletal negative musculoskeletal ROS (+)   Abdominal   Peds negative pediatric ROS (+)  Hematology Chronic anticoagulation, INR>2.2   Anesthesia Other Findings   Reproductive/Obstetrics negative OB ROS                          Anesthesia Physical Anesthesia Plan  ASA: III  Anesthesia Plan: General   Post-op Pain Management:    Induction: Intravenous  Airway Management Planned: Oral ETT  Additional Equipment:   Intra-op Plan:   Post-operative Plan: Extubation in OR  Informed Consent: I have reviewed the patients History and Physical, chart, labs and discussed the procedure including the risks, benefits and alternatives  for the proposed anesthesia with the patient or authorized representative who has indicated his/her understanding and acceptance.   Dental advisory given  Plan Discussed with: CRNA and Surgeon  Anesthesia Plan Comments:        Anesthesia Quick Evaluation

## 2015-02-03 ENCOUNTER — Encounter (HOSPITAL_COMMUNITY): Payer: Self-pay | Admitting: Gastroenterology

## 2015-02-03 DIAGNOSIS — R531 Weakness: Secondary | ICD-10-CM

## 2015-02-03 LAB — BASIC METABOLIC PANEL
Anion gap: 8 (ref 5–15)
BUN: 42 mg/dL — AB (ref 6–20)
CHLORIDE: 112 mmol/L — AB (ref 101–111)
CO2: 21 mmol/L — AB (ref 22–32)
CREATININE: 2.51 mg/dL — AB (ref 0.61–1.24)
Calcium: 8.3 mg/dL — ABNORMAL LOW (ref 8.9–10.3)
GFR calc non Af Amer: 25 mL/min — ABNORMAL LOW (ref >60)
GFR, EST AFRICAN AMERICAN: 29 mL/min — AB (ref >60)
Glucose, Bld: 121 mg/dL — ABNORMAL HIGH (ref 65–99)
Potassium: 3.4 mmol/L — ABNORMAL LOW (ref 3.5–5.1)
Sodium: 141 mmol/L (ref 135–145)

## 2015-02-03 LAB — CBC
HEMATOCRIT: 34.2 % — AB (ref 39.0–52.0)
HEMOGLOBIN: 11 g/dL — AB (ref 13.0–17.0)
MCH: 27.6 pg (ref 26.0–34.0)
MCHC: 32.2 g/dL (ref 30.0–36.0)
MCV: 85.7 fL (ref 78.0–100.0)
Platelets: 107 10*3/uL — ABNORMAL LOW (ref 150–400)
RBC: 3.99 MIL/uL — ABNORMAL LOW (ref 4.22–5.81)
RDW: 15 % (ref 11.5–15.5)
WBC: 7.8 10*3/uL (ref 4.0–10.5)

## 2015-02-03 LAB — PROTIME-INR
INR: 2.37 — ABNORMAL HIGH (ref 0.00–1.49)
Prothrombin Time: 25.6 seconds — ABNORMAL HIGH (ref 11.6–15.2)

## 2015-02-03 LAB — LIPASE, BLOOD: Lipase: 269 U/L — ABNORMAL HIGH (ref 11–51)

## 2015-02-03 MED ORDER — METOCLOPRAMIDE HCL 5 MG/5ML PO SOLN
10.0000 mg | Freq: Three times a day (TID) | ORAL | Status: DC
  Administered 2015-02-03 – 2015-02-07 (×7): 10 mg via ORAL
  Filled 2015-02-03 (×20): qty 10

## 2015-02-03 MED ORDER — WARFARIN SODIUM 2.5 MG PO TABS
2.5000 mg | ORAL_TABLET | Freq: Once | ORAL | Status: AC
  Administered 2015-02-03: 2.5 mg via ORAL
  Filled 2015-02-03: qty 1

## 2015-02-03 MED ORDER — CITALOPRAM HYDROBROMIDE 20 MG PO TABS
20.0000 mg | ORAL_TABLET | Freq: Every day | ORAL | Status: DC
  Administered 2015-02-03 – 2015-02-11 (×6): 20 mg via ORAL
  Filled 2015-02-03 (×7): qty 1

## 2015-02-03 NOTE — Progress Notes (Signed)
ANTICOAGULATION CONSULT NOTE - Follow Up Consult  Pharmacy Consult for Coumadin Indication: AFIB + CVAs  Allergies  Allergen Reactions  . Novocain [Procaine Hcl] Nausea And Vomiting    Patient Measurements: Height: 6\' 4"  (193 cm) Weight: 173 lb 14.4 oz (78.881 kg) IBW/kg (Calculated) : 86.8  Vital Signs: Temp: 97.6 F (36.4 C) (01/06 0513) Temp Source: Oral (01/06 0513) BP: 110/82 mmHg (01/06 0513) Pulse Rate: 78 (01/06 0513)  Labs:  Recent Labs  02/01/15 0407 02/02/15 0351 02/03/15 0340  HGB  --   --  11.0*  HCT  --   --  34.2*  PLT  --   --  107*  LABPROT 31.2* 28.7* 25.6*  INR 3.08* 2.76* 2.37*  CREATININE 3.28* 2.80* 2.51*    Estimated Creatinine Clearance: 31.9 mL/min (by C-G formula based on Cr of 2.51).  Assessment: N/V/D, admitted 12/27, abdominal pain, weight loss.  AC: Warfarin for hx afib/CVAs - INR 2.37 still declining, Hgb 11 down. Plts 107 down, no bleeding noted - s/p vit K 5mg  PO on 12/29. Coumadin resumed 1/5 PM.   Goal of Therapy:  INR 2-3 Monitor platelets by anticoagulation protocol: Yes   Plan:  - Coumadin 2.5 mg po x 1 tonight. - Daily INR - SNF  Babette Stum S. Merilynn Finland, PharmD, BCPS Clinical Staff Pharmacist Pager [phone]  Misty Stanley Stillinger 02/03/2015,11:11 AM

## 2015-02-03 NOTE — Progress Notes (Signed)
Pt is still on full liquid diet. RN unable to advanced pt's diet, pt still refusing to eat.  Pt states that food '' don't taste right.Marland Kitchen'' RN will continue to encourage pt to eat.

## 2015-02-03 NOTE — Progress Notes (Signed)
EGD with dilatation done. No definite abnormality seen. Will follow at a distance. Dr Ewing Schlein on call this weekend if further GI input needed

## 2015-02-03 NOTE — Progress Notes (Signed)
Family Medicine Teaching Service Daily Progress Note Intern Pager: [phone]  Patient name: Brian Burns Medical record number: [id number] Date of birth: 1945/05/25 Age: 70 y.o. Gender: male  Primary Care Provider: Shirlee Latch, MD Consultants: Nephrology, GI Code Status: DNR  Pt Overview and Major Events to Date:  12/27: admit for abdominal pain 12/28: PT recommended SNF  12/29: [facility] to follow from distance  1/1: SLP evaluation  1/3: MRI with no acute CVA 1/4: Barium swallow test  Assessment and Plan: DEITRICK CAPRA is a 70 y.o. male presenting with abdominal pain, weight loss. PMH is significant for urethral strictures, CAD, paroxysmal afib on warfarin, CKD, HTN, CVA x2, BPH, DM 2, AAA.   Weight loss, moderate malnutrition in context of chronic illness and poor by mouth: no improvement. Says food don't taste good. Doesn't feel hungry. Lipase improved (338->195). Has been elevated for the past month. Barium swallow on 01/04 concerning for narrowing of GE junction/dysmotility but poor quality due to vomiting. Had EGD negative for lesion. Post empiric esophageal dilatation on 1/5.  - Appreciate GI recs  - Advancing diet as tolerated: had 120 ml of fluid by mouth over the last 24 hrs  - Concern for achalasia. Could consider manometry if no progress with by mouth.  - Continue Remeron to stimulate appetite - Nutrition consulted, appreciate recs  -continue Enssure. Patient not taking  - PT/OT recommend SNF placement. Refused physical therapy on 01/04.  - SW on board  Hx of CVA's: had word finding. Having some balance problems with ongoing swallowing issues. CT head with atrophy with small vessel chronic ischemic changes of deep cerebral white matter, and old infarcts in cerebellum and BG. Gait issue could also be due to malnutrition. Doubt vit B12 def with MCV of 87%.  MRI 01/03 with signs of chronic infarct but no acute intracranial process.  -Continue physical therapy and  nutrition  Dehydration: possibly contributing to electrolyte abnormalities. - phenergan 12.5mg  IV q6hrs for nausea - encouraged to drink fluids throughout the day  Hypernatremia, hyperchloremia: resolved. Na 141 and Cl 112. Patient still with poor by mouth and at risk for these. - D/cd D5W - continue to monitor BMP - Continue to encourage PO   Hyperkalemia: resolved. k 3.4 -will monitor BMP and replete as needed  Constipation: achieved bowel movement on 1/1 with soap suds enema.  - continue colace - soap sud enema as needed   AKI on CKD3: improving. 4.12 on admission. 2.5 this morning. - Monitor BMP - Outpt nephrology  BPH: apparently hasn't been taking finasteride or flomax.  - continue foley - monitor UOP - will need urology follow up as an outpatient  Paroxysmal afib: in NSR currently, anticoagulated with warfarin, INR supra-therapeutic to 5.5 on 12/28. INR 2.37 today - follow INR - warfarin per pharm  CAD: stable  - Continue home atorvastatin and aspirin  Left lung nodule: extensive smoking history. Observed on CT on 12/27 - repeat CT scan in 1 year   FEN/GI:  -D5 + Free water  -Heart healthy Protonix and tums    Prophylaxis: on warfarin  Disposition: Discharge to SNF when medically stable. GI may do manometry  Subjective: No acute event over night. Still with epigastric pain, indigestion. Still poor appetite  Objective: Temp:  [97.6 F (36.4 C)-97.7 F (36.5 C)] 97.6 F (36.4 C) (01/06 0513) Pulse Rate:  [70-88] 78 (01/06 0513) Resp:  [12-18] 18 (01/06 0513) BP: (110-144)/(82-113) 110/82 mmHg (01/06 0513) SpO2:  [99 %-100 %]  100 % (01/06 0513)   Physical Exam: General: lying in bed, appears weak and wasted, no distress Cardiovascular: RRR, no murmurs Respiratory: CTAB, no wheezes Abdomen: soft, BS+, tender to palpation over LUQ Extremities: no LE edema or tenderness Skin: Tenting skin  Laboratory:  Recent Labs Lab 01/28/15 0340  01/31/15 0247 02/03/15 0340  WBC 9.5 10.5 7.8  HGB 12.5* 12.7* 11.0*  HCT 39.2 40.7 34.2*  PLT 139* 135* 107*    Recent Labs Lab 01/28/15 1139  02/01/15 0407 02/02/15 0351 02/03/15 0340  NA  --   < > 148* 146* 141  K  --   < > 4.0 3.8 3.4*  CL  --   < > 121* 117* 112*  CO2  --   < > 19* 20* 21*  BUN  --   < > 63* 54* 42*  CREATININE  --   < > 3.28* 2.80* 2.51*  CALCIUM  --   < > 8.6* 8.3* 8.3*  PROT 6.9  --   --   --   --   BILITOT 0.6  --   --   --   --   ALKPHOS 102  --   --   --   --   ALT 20  --   --   --   --   AST 27  --   --   --   --   GLUCOSE  --   < > 174* 156* 121*  < > = values in this interval not displayed.  Almon Hercules, MD 02/03/2015, 9:47 AM PGY-1 [facility] Family Medicine FPTS Intern pager: [phone], text pages welcome

## 2015-02-04 DIAGNOSIS — D61818 Other pancytopenia: Secondary | ICD-10-CM

## 2015-02-04 DIAGNOSIS — R29818 Other symptoms and signs involving the nervous system: Secondary | ICD-10-CM

## 2015-02-04 DIAGNOSIS — D619 Aplastic anemia, unspecified: Secondary | ICD-10-CM | POA: Insufficient documentation

## 2015-02-04 LAB — BASIC METABOLIC PANEL
ANION GAP: 9 (ref 5–15)
BUN: 43 mg/dL — ABNORMAL HIGH (ref 6–20)
CALCIUM: 8.8 mg/dL — AB (ref 8.9–10.3)
CO2: 21 mmol/L — ABNORMAL LOW (ref 22–32)
CREATININE: 2.49 mg/dL — AB (ref 0.61–1.24)
Chloride: 117 mmol/L — ABNORMAL HIGH (ref 101–111)
GFR, EST AFRICAN AMERICAN: 29 mL/min — AB (ref >60)
GFR, EST NON AFRICAN AMERICAN: 25 mL/min — AB (ref >60)
Glucose, Bld: 113 mg/dL — ABNORMAL HIGH (ref 65–99)
POTASSIUM: 3.9 mmol/L (ref 3.5–5.1)
Sodium: 147 mmol/L — ABNORMAL HIGH (ref 135–145)

## 2015-02-04 LAB — PROTIME-INR
INR: 2.46 — ABNORMAL HIGH (ref 0.00–1.49)
PROTHROMBIN TIME: 26.4 s — AB (ref 11.6–15.2)

## 2015-02-04 LAB — CBC
HCT: 33.6 % — ABNORMAL LOW (ref 39.0–52.0)
Hemoglobin: 10.8 g/dL — ABNORMAL LOW (ref 13.0–17.0)
MCH: 27.6 pg (ref 26.0–34.0)
MCHC: 32.1 g/dL (ref 30.0–36.0)
MCV: 85.7 fL (ref 78.0–100.0)
PLATELETS: 99 10*3/uL — AB (ref 150–400)
RBC: 3.92 MIL/uL — AB (ref 4.22–5.81)
RDW: 15 % (ref 11.5–15.5)
WBC: 6.4 10*3/uL (ref 4.0–10.5)

## 2015-02-04 LAB — LACTATE DEHYDROGENASE: LDH: 313 U/L — AB (ref 98–192)

## 2015-02-04 LAB — SAVE SMEAR

## 2015-02-04 MED ORDER — WARFARIN SODIUM 1 MG PO TABS
0.5000 mg | ORAL_TABLET | Freq: Once | ORAL | Status: AC
  Administered 2015-02-04: 0.5 mg via ORAL
  Filled 2015-02-04: qty 0.5

## 2015-02-04 MED ORDER — DOCUSATE SODIUM 100 MG PO CAPS
100.0000 mg | ORAL_CAPSULE | Freq: Two times a day (BID) | ORAL | Status: DC
  Administered 2015-02-05 – 2015-02-11 (×7): 100 mg via ORAL
  Filled 2015-02-04 (×8): qty 1

## 2015-02-04 NOTE — Progress Notes (Signed)
02/04/2015 1:50 PM Pt wanted to get up and sit and w/c for awhile.   Pt assisted up to w/c, stayed up in chair for a couple hours.  Tolerated well. Brian Burns

## 2015-02-04 NOTE — Progress Notes (Signed)
Family Medicine Teaching Service Daily Progress Note Intern Pager: [phone]  Patient name: Brian Burns Medical record number: [id number] Date of birth: [birth year] Age: 70 y.o. Gender: male  Primary Care Provider: Shirlee Latch, MD Consultants: Nephrology, GI Code Status: DNR  Pt Overview and Major Events to Date:  12/27: admit for abdominal pain 12/28: PT recommended SNF  12/29: [facility] to follow from distance  1/1: SLP evaluation  1/3: MRI with no acute CVA 1/4: Barium swallow test 1/5: Esophageal dilation   Assessment and Plan: Brian Burns is a 70 y.o. male presenting with abdominal pain, weight loss. PMH is significant for urethral strictures, CAD, paroxysmal afib on warfarin, CKD, HTN, CVA x2, BPH, DM 2, AAA.   Failure to thrive, moderate malnutrition in context of chronic illness and poor by mouth: no improvement.  Had EGD negative for lesion. Post empiric esophageal dilatation on 1/5.  - Appreciate GI recs - started lexapro  - Continue Remeron to stimulate appetite - Nutrition consulted, appreciate recs - PT/OT recommend SNF placement. Refused physical therapy on 01/04.  - SW on board  Nausea/vomiting: Lipase has risen again. Having concern with chronic pancreatitis.  - Will get GI's input if creon would be appropriate.  - short term reglan   Anemia: Hgb has been down trending but denies any bleeding  - FOBT   Hypernatremia, hyperchloremia:  Has been transient depending on fluid administration  - continue to monitor BMP - Continue to encourage PO   Constipation: achieved bowel movement on 1/1 with soap suds enema.  - continue colace, changed to daily - soap sud enema today   AKI on CKD3: appears to be around his baseline  - Monitor BMP - Outpt nephrology  BPH/Urethral strictures: stable   - continue foley - monitor UOP - will need urology follow up as an outpatient  Paroxysmal afib: in NSR currently, anticoagulated with warfarin.  - follow  INR - warfarin per pharm  CAD,Hx of CVA's: had word finding. Having some balance problems with ongoing swallowing issues. CT head with atrophy with small vessel chronic ischemic changes of deep cerebral white matter, and old infarcts in cerebellum and BG. Gait issue could also be due to malnutrition. Doubt vit B12 def with MCV of 87%.  MRI 01/03 with signs of chronic infarct but no acute intracranial process.  -Continue physical therapy and nutrition - Continue home atorvastatin and aspirin  Left lung nodule: extensive smoking history. Observed on CT on 12/27 - repeat CT scan in 1 year   FEN/GI:  -saline lock -Heart healthy Protonix and tums    Prophylaxis: on warfarin  Disposition: Discharge to SNF when medically stable. GI may do manometry  Subjective: Reports he cannot taste his food. Hasn't had a bowel movement in one week but has been voiding a fair amount.   Objective: Temp:  [97.6 F (36.4 C)-98.5 F (36.9 C)] 98.2 F (36.8 C) (01/07 0700) Pulse Rate:  [78-86] 86 (01/07 0700) Resp:  [17-18] 17 (01/07 0700) BP: (118-127)/(69-90) 118/90 mmHg (01/07 0700) SpO2:  [99 %-100 %] 99 % (01/07 0700) Weight:  [159 lb (72.122 kg)] 159 lb (72.122 kg) (01/07 0700)   Physical Exam: General: lying in bed, appears weak, no distress HEENT: Tacky MM  Cardiovascular: RRR, no murmurs Respiratory: CTAB, no wheezes Abdomen: soft, BS+, tender to palpation over LUQ Extremities: no LE edema or tenderness Skin: Tenting skin  Laboratory:  Recent Labs Lab 01/31/15 0247 02/03/15 0340 02/04/15 0731  WBC 10.5 7.8 6.4  HGB 12.7* 11.0* 10.8*  HCT 40.7 34.2* 33.6*  PLT 135* 107* 99*    Recent Labs Lab 01/28/15 1139  02/02/15 0351 02/03/15 0340 02/04/15 0731  NA  --   < > 146* 141 147*  K  --   < > 3.8 3.4* 3.9  CL  --   < > 117* 112* 117*  CO2  --   < > 20* 21* 21*  BUN  --   < > 54* 42* 43*  CREATININE  --   < > 2.80* 2.51* 2.49*  CALCIUM  --   < > 8.3* 8.3* 8.8*  PROT 6.9   --   --   --   --   BILITOT 0.6  --   --   --   --   ALKPHOS 102  --   --   --   --   ALT 20  --   --   --   --   AST 27  --   --   --   --   GLUCOSE  --   < > 156* 121* 113*  < > = values in this interval not displayed.  Myra Rude, MD 02/04/2015, 9:16 AM PGY-3 [place] [facility] FPTS Intern pager: [phone], text pages welcome

## 2015-02-04 NOTE — Progress Notes (Signed)
ANTICOAGULATION CONSULT NOTE - Follow Up Consult  Pharmacy Consult for Coumadin Indication: AFIB + CVAs  Allergies  Allergen Reactions  . Novocain [Procaine Hcl] Nausea And Vomiting    Patient Measurements: Height: 6\' 4"  (193 cm) Weight: 159 lb (72.122 kg) IBW/kg (Calculated) : 86.8  Vital Signs: Temp: 98.2 F (36.8 C) (01/07 0700) Temp Source: Oral (01/07 0700) BP: 137/102 mmHg (01/07 1042) Pulse Rate: 86 (01/07 0700)  Labs:  Recent Labs  02/02/15 0351 02/03/15 0340 02/04/15 0731  HGB  --  11.0* 10.8*  HCT  --  34.2* 33.6*  PLT  --  107* 99*  LABPROT 28.7* 25.6* 26.4*  INR 2.76* 2.37* 2.46*  CREATININE 2.80* 2.51* 2.49*    Estimated Creatinine Clearance: 29.4 mL/min (by C-G formula based on Cr of 2.49).  Assessment: N/V/D, admitted 12/27, abdominal pain, weight loss.  AC: Warfarin for hx afib/CVAs (admit INR 5.94) - INR 2.42, Hgb 10.8. Plts 99 down, no bleeding noted - s/p vit K 5mg  PO on 12/29.  Goal of Therapy:  INR 2-3 Monitor platelets by anticoagulation protocol: Yes   Plan:  - Coumadin 0.5 mg po x 1 tonight. - Daily INR   Mertie Haslem S. Merilynn Finland, PharmD, BCPS Clinical Staff Pharmacist Pager [phone]  Misty Stanley Stillinger 02/04/2015,1:17 PM

## 2015-02-05 LAB — CBC
HEMATOCRIT: 36.3 % — AB (ref 39.0–52.0)
HEMOGLOBIN: 11.5 g/dL — AB (ref 13.0–17.0)
MCH: 27.2 pg (ref 26.0–34.0)
MCHC: 31.7 g/dL (ref 30.0–36.0)
MCV: 85.8 fL (ref 78.0–100.0)
Platelets: 106 10*3/uL — ABNORMAL LOW (ref 150–400)
RBC: 4.23 MIL/uL (ref 4.22–5.81)
RDW: 15.1 % (ref 11.5–15.5)
WBC: 7.9 10*3/uL (ref 4.0–10.5)

## 2015-02-05 LAB — BASIC METABOLIC PANEL
Anion gap: 9 (ref 5–15)
BUN: 47 mg/dL — AB (ref 6–20)
CHLORIDE: 117 mmol/L — AB (ref 101–111)
CO2: 22 mmol/L (ref 22–32)
CREATININE: 2.5 mg/dL — AB (ref 0.61–1.24)
Calcium: 8.9 mg/dL (ref 8.9–10.3)
GFR calc Af Amer: 29 mL/min — ABNORMAL LOW (ref >60)
GFR calc non Af Amer: 25 mL/min — ABNORMAL LOW (ref >60)
GLUCOSE: 121 mg/dL — AB (ref 65–99)
POTASSIUM: 4 mmol/L (ref 3.5–5.1)
Sodium: 148 mmol/L — ABNORMAL HIGH (ref 135–145)

## 2015-02-05 LAB — HAPTOGLOBIN: Haptoglobin: 69 mg/dL (ref 34–200)

## 2015-02-05 LAB — PROTIME-INR
INR: 2.46 — ABNORMAL HIGH (ref 0.00–1.49)
PROTHROMBIN TIME: 26.4 s — AB (ref 11.6–15.2)

## 2015-02-05 LAB — LIPASE, BLOOD: Lipase: 133 U/L — ABNORMAL HIGH (ref 11–51)

## 2015-02-05 LAB — OCCULT BLOOD X 1 CARD TO LAB, STOOL: Fecal Occult Bld: NEGATIVE

## 2015-02-05 MED ORDER — DEXTROSE 5 % IV SOLN
INTRAVENOUS | Status: AC
  Administered 2015-02-05: 11:00:00 via INTRAVENOUS

## 2015-02-05 MED ORDER — PANCRELIPASE (LIP-PROT-AMYL) 12000-38000 UNITS PO CPEP
12000.0000 [IU] | ORAL_CAPSULE | Freq: Three times a day (TID) | ORAL | Status: DC
  Administered 2015-02-05 – 2015-02-11 (×13): 12000 [IU] via ORAL
  Filled 2015-02-05 (×14): qty 1

## 2015-02-05 MED ORDER — WARFARIN SODIUM 3 MG PO TABS
1.5000 mg | ORAL_TABLET | Freq: Once | ORAL | Status: AC
  Administered 2015-02-05: 1.5 mg via ORAL
  Filled 2015-02-05: qty 0.5

## 2015-02-05 NOTE — Progress Notes (Signed)
Family Medicine Teaching Service Daily Progress Note Intern Pager: [phone]  Patient name: Brian Burns Medical record number: [id number] Date of birth: December 13, 1945 Age: 70 y.o. Gender: male  Primary Care Provider: Shirlee Latch, MD Consultants: Nephrology, GI Code Status: DNR  Pt Overview and Major Events to Date:  12/27: admit for abdominal pain 12/28: PT recommended SNF  12/29: [facility] to follow from distance  1/1: SLP evaluation  1/3: MRI with no acute CVA 1/4: Barium swallow test 1/5: Esophageal dilation  1/8: started creon  Assessment and Plan: Brian Burns is a 70 y.o. male presenting with abdominal pain, weight loss. PMH is significant for urethral strictures, CAD, paroxysmal afib on warfarin, CKD, HTN, CVA x2, BPH, DM 2, AAA.   Failure to thrive, moderate malnutrition in context of chronic illness and poor by mouth: no improvement.  Had EGD negative for lesion. Post empiric esophageal dilatation on 1/5.  - GI: discussed yesterday about ongoing symptoms   - try creon. Most likely won't have improvement   - if no improvement with creon then call back and they can do botox injection or manometry  - FOBT done today  - started lexapro on 1/6 - Continue Remeron to stimulate appetite - Nutrition consulted, appreciate recs - PT/OT recommend SNF placement. Refused physical therapy on 01/04.  - SW on board  Nausea/vomiting: Lipase has risen again. Having concern with chronic pancreatitis.  - Creon 12,000 TID WC then titrate up as necessary  - short term reglan   Anemia: Hgb trending up. LDH suggests hemolysis  - FOBT   Hypernatremia, hyperchloremia:  Has been transient depending on fluid administration  - continue to monitor BMP - Continue to encourage PO   Constipation: achieved bowel movement on 1/1 with soap suds enema. He has been refusing treatments.  - continue colace, changed to daily - soap sud enema today   AKI on CKD3: appears to be around his  baseline  - Monitor BMP - Outpt nephrology  BPH/Urethral strictures: stable   - continue foley - monitor UOP - will need urology follow up as an outpatient  Paroxysmal afib: in NSR currently, anticoagulated with warfarin.  - follow INR - warfarin per pharm  CAD,Hx of CVA's: had word finding. Having some balance problems with ongoing swallowing issues. CT head with atrophy with small vessel chronic ischemic changes of deep cerebral white matter, and old infarcts in cerebellum and BG. Gait issue could also be due to malnutrition. Doubt vit B12 def with MCV of 87%.  MRI 01/03 with signs of chronic infarct but no acute intracranial process.  -Continue physical therapy and nutrition - Continue home atorvastatin and aspirin  Left lung nodule: extensive smoking history. Observed on CT on 12/27 - repeat CT scan in 1 year   FEN/GI:  -saline lock -Heart healthy Protonix and tums    Prophylaxis: on warfarin  Disposition: Discharge to SNF when medically stable. GI may do manometry  Subjective: Was able to get some sleep last night. Feeling better.   Objective: Temp:  [98.2 F (36.8 C)] 98.2 F (36.8 C) (01/07 0700) Pulse Rate:  [86-97] 97 (01/07 1344) Resp:  [17-18] 18 (01/07 1344) BP: (112-137)/(90-102) 112/92 mmHg (01/07 1344) SpO2:  [99 %-100 %] 100 % (01/07 1344) Weight:  [144 lb 8 oz (65.545 kg)-159 lb (72.122 kg)] 144 lb 8 oz (65.545 kg) (01/08 0108)   Physical Exam: General: lying in bed, appears weak, no distress HEENT: Tacky MM  Cardiovascular: RRR, no murmurs Respiratory:  CTAB, no wheezes Abdomen: soft, BS+, tender to palpation over LUQ Extremities: no LE edema or tenderness Skin: Tenting skin Rectal: painful with DRE, normal sphincter tone   Laboratory:  Recent Labs Lab 02/03/15 0340 02/04/15 0731 02/05/15 0320  WBC 7.8 6.4 7.9  HGB 11.0* 10.8* 11.5*  HCT 34.2* 33.6* 36.3*  PLT 107* 99* 106*    Recent Labs Lab 02/03/15 0340 02/04/15 0731  02/05/15 0320  NA 141 147* 148*  K 3.4* 3.9 4.0  CL 112* 117* 117*  CO2 21* 21* 22  BUN 42* 43* 47*  CREATININE 2.51* 2.49* 2.50*  CALCIUM 8.3* 8.8* 8.9  GLUCOSE 121* 113* 121*    Myra Rude, MD 02/05/2015, 6:46 AM PGY-3 [place] [facility] FPTS Intern pager: [phone], text pages welcome

## 2015-02-05 NOTE — Progress Notes (Signed)
ANTICOAGULATION CONSULT NOTE - Follow Up Consult  Pharmacy Consult for Coumadin Indication: AFIB + CVAs  Allergies  Allergen Reactions  . Novocain [Procaine Hcl] Nausea And Vomiting    Patient Measurements: Height: 6\' 4"  (193 cm) Weight: 144 lb 8 oz (65.545 kg) IBW/kg (Calculated) : 86.8  Vital Signs: Temp: 98.5 F (36.9 C) (01/08 0657) Temp Source: Oral (01/08 0657) BP: 122/93 mmHg (01/08 1049) Pulse Rate: 99 (01/08 0657)  Labs:  Recent Labs  02/03/15 0340 02/04/15 0731 02/05/15 0320  HGB 11.0* 10.8* 11.5*  HCT 34.2* 33.6* 36.3*  PLT 107* 99* 106*  LABPROT 25.6* 26.4* 26.4*  INR 2.37* 2.46* 2.46*  CREATININE 2.51* 2.49* 2.50*    Estimated Creatinine Clearance: 26.6 mL/min (by C-G formula based on Cr of 2.5).  Assessment: N/V/D, admitted 12/27, abdominal pain, weight loss.  AC: Warfarin for hx afib/CVAs (admit INR 5.94, on 2.5mg  daily) - INR 2.46, Hgb 11.5 up. Plts 106 low but stable today, no bleeding noted - s/p vit K 5mg  PO on 12/29.  Goal of Therapy:  INR 2-3 Monitor platelets by anticoagulation protocol: Yes   Plan:  - Coumadin 1.5 mg po x 1 tonight. - Daily INR   Jaselyn Nahm S. Merilynn Finland, PharmD, BCPS Clinical Staff Pharmacist Pager [phone]  Misty Stanley Stillinger 02/05/2015,12:37 PM

## 2015-02-06 LAB — PROTIME-INR
INR: 2.44 — ABNORMAL HIGH (ref 0.00–1.49)
PROTHROMBIN TIME: 26.2 s — AB (ref 11.6–15.2)

## 2015-02-06 LAB — CBC
HCT: 37.8 % — ABNORMAL LOW (ref 39.0–52.0)
Hemoglobin: 11.9 g/dL — ABNORMAL LOW (ref 13.0–17.0)
MCH: 27 pg (ref 26.0–34.0)
MCHC: 31.5 g/dL (ref 30.0–36.0)
MCV: 85.7 fL (ref 78.0–100.0)
PLATELETS: 142 10*3/uL — AB (ref 150–400)
RBC: 4.41 MIL/uL (ref 4.22–5.81)
RDW: 15.3 % (ref 11.5–15.5)
WBC: 8.8 10*3/uL (ref 4.0–10.5)

## 2015-02-06 LAB — BASIC METABOLIC PANEL
Anion gap: 10 (ref 5–15)
BUN: 47 mg/dL — AB (ref 6–20)
CALCIUM: 9 mg/dL (ref 8.9–10.3)
CO2: 24 mmol/L (ref 22–32)
CREATININE: 2.46 mg/dL — AB (ref 0.61–1.24)
Chloride: 115 mmol/L — ABNORMAL HIGH (ref 101–111)
GFR calc Af Amer: 30 mL/min — ABNORMAL LOW (ref >60)
GFR, EST NON AFRICAN AMERICAN: 26 mL/min — AB (ref >60)
Glucose, Bld: 141 mg/dL — ABNORMAL HIGH (ref 65–99)
Potassium: 4.3 mmol/L (ref 3.5–5.1)
SODIUM: 149 mmol/L — AB (ref 135–145)

## 2015-02-06 LAB — GLUCOSE, CAPILLARY: GLUCOSE-CAPILLARY: 166 mg/dL — AB (ref 65–99)

## 2015-02-06 LAB — PATHOLOGIST SMEAR REVIEW

## 2015-02-06 MED ORDER — WARFARIN SODIUM 3 MG PO TABS: 1.5000 mg | ORAL_TABLET | Freq: Once | ORAL | Status: DC

## 2015-02-06 MED ORDER — DEXTROSE 5 % IV SOLN: INTRAVENOUS | Status: DC

## 2015-02-06 MED ORDER — WARFARIN SODIUM 3 MG PO TABS
1.5000 mg | ORAL_TABLET | Freq: Once | ORAL | Status: AC
  Administered 2015-02-06: 1.5 mg via ORAL
  Filled 2015-02-06: qty 0.5

## 2015-02-06 NOTE — Progress Notes (Signed)
Family Medicine Teaching Service Daily Progress Note Intern Pager: [phone]  Patient name: DELAINE SAUBER Medical record number: [id number] Date of birth: [birth year] Age: 70 y.o. Gender: male  Primary Care Provider: Shirlee Latch, MD Consultants: Nephrology, GI Code Status: DNR  Pt Overview and Major Events to Date:  12/27: admit for abdominal pain 12/28: PT recommended SNF  12/29: [facility] to follow from distance  1/1: SLP evaluation  1/3: MRI with no acute CVA 1/4: Barium swallow test 1/5: Esophageal dilation  1/8: started creon  Assessment and Plan: SHO DIENER is a 70 y.o. male presenting with abdominal pain, weight loss. PMH is significant for urethral strictures, CAD, paroxysmal afib on warfarin, CKD, HTN, CVA x2, BPH, DM 2, AAA.   Failure to thrive, mod malnutrition in context of chronic illness & poor by mouth: still anorexic with nausea. Had EGD negative for lesion. Post empiric esophageal dilatation on 1/5. FOBT neg. - appreciate GI recs. Following further recs.  - try creon. Most likely won't have improvement   - if no improvement with creon then call back and they can do botox injection or manometry  - started lexapro on 1/6 - Continue Remeron to stimulate appetite - short term reglan  - Nutrition consulted, appreciate recs - PT/OT recommend SNF placement. Refused physical therapy on 01/04.  - SW on board  History of chronic pancreatitis: lipase 380 on admission. Trended down to 133 -Creon 1200 mg three times a day. - Anemia: Hgb trending up. LDH suggests hemolysis  - FOBT neg.  Hypernatremia, hyperchloremia:  Na 149. Cl 115. Likely from poor by mouth. Water deficit 3L - D5 at 125 ml/hr for 24 hrs - continue to monitor BMP - Continue to encourage PO   Constipation: achieved bowel movement on 1/1 with soap suds enema. He has been refusing treatments.  - continue colace, changed to daily - soap sud enema if patient agrees  AKI on CKD3: improving. Cr  2.47. B/l 1-1.4. - Monitor BMP - Outpt nephrology  BPH/Urethral strictures: stable   - continue foley - monitor UOP - will need urology follow up as an outpatient  Paroxysmal afib: in NSR currently, anticoagulated with warfarin. INR in therapeutic range. - follow INR - warfarin per pharm  CAD,Hx of CVA's: had word finding and some balance problems with ongoing swallowing issues. CT head with atrophy with small vessel chronic ischemic changes of deep cerebral WM, and old infarcts in cerebellum and BG. Gait issue could also be due to malnutrition. Doubt vit B12 def with MCV of 87%.  MRI 01/03 with signs of chronic infarct but no acute intracranial process.  -Continue physical therapy and nutrition - Continue home atorvastatin and aspirin  Left lung nodule: extensive smoking history. Observed on CT on 12/27 - repeat CT scan in 1 year   FEN/GI:  -saline lock -Heart healthy Protonix and tums    Prophylaxis: on warfarin  Disposition: Discharge to SNF when medically stable. GI may do manometry  Subjective: No complaint today. Still weak. Working with physical therapy. Denies chest pain, abdominal pain. Says food and juice don't taste right. Water is okay.  Objective: Temp:  [97.4 F (36.3 C)-98.7 F (37.1 C)] 98.7 F (37.1 C) (01/09 0504) Pulse Rate:  [90-99] 93 (01/09 0504) Resp:  [17-18] 18 (01/09 0504) BP: (120-141)/(88-99) 138/97 mmHg (01/09 0504) SpO2:  [100 %] 100 % (01/09 0504)   Physical Exam: General: sat on the edge of the bed for two minutes, working with physical therapy,  appears weak and wasted Cardiovascular: RRR, no murmurs Respiratory: CTAB, no wheezes Abdomen: soft, BS+, tender to palpation over LUQ Extremities: no LE edema or tenderness Skin: Tenting skin Neuro: AAO to person & place.   Laboratory:  Recent Labs Lab 02/04/15 0731 02/05/15 0320 02/06/15 0543  WBC 6.4 7.9 8.8  HGB 10.8* 11.5* 11.9*  HCT 33.6* 36.3* 37.8*  PLT 99* 106* 142*     Recent Labs Lab 02/04/15 0731 02/05/15 0320 02/06/15 0543  NA 147* 148* 149*  K 3.9 4.0 4.3  CL 117* 117* 115*  CO2 21* 22 24  BUN 43* 47* 47*  CREATININE 2.49* 2.50* 2.46*  CALCIUM 8.8* 8.9 9.0  GLUCOSE 113* 121* 141*    Almon Hercules, MD 02/06/2015, 6:56 AM PGY-3 [place] [facility] FPTS Intern pager: [phone], text pages welcome

## 2015-02-06 NOTE — Progress Notes (Signed)
Patient will need esophageal manometry. These are done at [facility] on Mondays and Wednesdays. We will need to have [facility] transport patient to [facility] on Wednesday morning 1/11 and have him there by 7:15 AM. He will need to be and be however after midnight tomorrow night

## 2015-02-06 NOTE — Care Management Note (Addendum)
Case Management Note  Patient Details  Name: MATE ALEGRIA MRN: [id number] Date of Birth: 05/31/1945  Subjective/Objective:      Pt admitted for abdominal pain, pancreatitis              Update Action Plan:  Patient has scheduled esophageal manometry for this Wednesday 02/08/15, pt will transport to [facility]  CM contacted attending service 02/06/15 am to inquire of plan of care as pt did not have procedure.  CM was informed that GI had been re-consulted this am and will either move forward with botox injection, manometry or G tube placement.  Decision will be made by GI doctor.  CM contacted attending service 02/03/15 afternoon to inquire of plan of care; CM was informed that pt would possibly have manometry test over the weekend to determine if G tube was necessary.   Action/Plan:  Pt is from home with wife, daughter provided additional support.  Pt plans to discharge to SNF post nutritional need being met.   Expected Discharge Date:                  Expected Discharge Plan:  Skilled Nursing Facility  In-House Referral:  Clinical Social Work  Discharge planning Services  CM Consult  Post Acute Care Choice:    Choice offered to:     DME Arranged:    DME Agency:     HH Arranged:    [place] Agency:     Status of Service:  In process, will continue to follow  Medicare Important Message Given:  Yes Date Medicare IM Given:    Medicare IM give by:    Date Additional Medicare IM Given:    Additional Medicare Important Message give by:     If discussed at [place] of Stay Meetings, dates discussed:    Additional Comments: CM assessed pt, daughter at bedside.  CM will continue to monitor for disposition needs [name], RN 02/06/2015, 11:16 AM

## 2015-02-06 NOTE — Progress Notes (Signed)
RN administered medications and pt immediately began to dry heave. No meds visualized by RN. Pt refused any anti nausea meds. RN will continue to monitor.   Brian Burns

## 2015-02-06 NOTE — Progress Notes (Signed)
Transportation for Wednesday morning has been setup with [facility].  Pt will be transported and dropped off to [facility] at 6:30 am.

## 2015-02-06 NOTE — Progress Notes (Signed)
Physical Therapy Treatment Patient Details Name: [name] MRN: [id number] DOB: Feb 25, 1945 Today's Date: 02/06/2015    History of Present Illness Brian Burns is a 70 y.o. male presenting with abdominal pain, weight loss. PMH is significant for urethral strictures, CAD, paroxysmal afib, CKD, HTN, CVA x2, BPH, DM 2, AAA.     PT Comments    Attempting to progress mobility with PT services. Able to perform supine to sitting X3 and stand pivot to [facility] and back with assistance. Patient was unwilling to attempt ambulation at this time. Recommending SNF upon D/C based upon the patients current mobility level. Will continue to follow and attempt to progress mobility as tolerate. Heavy encouragement needed during session for patient to participate.   Follow Up Recommendations  SNF;Supervision/Assistance - 24 hour     Equipment Recommendations  Rolling walker with 5" wheels    Recommendations for Other Services       Precautions / Restrictions Precautions Precautions: Fall Restrictions Weight Bearing Restrictions: No    Mobility  Bed Mobility Overal bed mobility: Needs Assistance Bed Mobility: Supine to Sit;Sit to Supine     Supine to sit: Supervision Sit to supine: Supervision   General bed mobility comments: repeated transfers supine to sitting- X3. Patient reports needing to return to supine while sitting due to feeling dizzy.   Transfers Overall transfer level: Needs assistance Equipment used: None Transfers: Stand Pivot Transfers Sit to Stand: Min assist Stand pivot transfers: Min assist       General transfer comment: Assist with stand pivot transfer from bed to [facility] and back. Patient refusing to attempt any ambulation.   Ambulation/Gait                 Stairs            Wheelchair Mobility    Modified Rankin (Stroke Patients Only)       Balance Overall balance assessment: Needs assistance Sitting-balance support: No upper extremity  supported Sitting balance-Leahy Scale: Good     Standing balance support: Bilateral upper extremity supported Standing balance-Leahy Scale: Poor Standing balance comment: needing UE support                    Cognition Arousal/Alertness: Awake/alert Behavior During Therapy: WFL for tasks assessed/performed Overall Cognitive Status: Within Functional Limits for tasks assessed                      Exercises      General Comments        Pertinent Vitals/Pain Pain Assessment: No/denies pain    Home Living                      Prior Function            PT Goals (current goals can now be found in the care plan section) Acute Rehab PT Goals Patient Stated Goal: be able to go home PT Goal Formulation: With patient Time For Goal Achievement: 02/01/15 Potential to Achieve Goals: Good Progress towards PT goals: Progressing toward goals    Frequency  Min 3X/week    PT Plan Current plan remains appropriate    Co-evaluation             End of Session Equipment Utilized During Treatment: Gait belt Activity Tolerance: Patient limited by fatigue;Other (comment) (states feeling dizzy while sitting. ) Patient left: in bed;with call bell/phone within reach     Time: 0840-0859 PT Time  Calculation (min) (ACUTE ONLY): 19 min  Charges:  $Therapeutic Activity: 8-22 mins                    G Codes:      Christiane Ha, PT, CSCS Pager [phone] Office [phone]  02/06/2015, 12:02 PM

## 2015-02-06 NOTE — Progress Notes (Signed)
ANTICOAGULATION CONSULT NOTE - Follow Up Consult  Pharmacy Consult for Coumadin Indication: AFIB + CVAs  Allergies  Allergen Reactions  . Novocain [Procaine Hcl] Nausea And Vomiting    Patient Measurements: Height: 6\' 4"  (193 cm) Weight: 151 lb 6.4 oz (68.675 kg) IBW/kg (Calculated) : 86.8  Vital Signs: Temp: 98.7 F (37.1 C) (01/09 0504) Temp Source: Oral (01/09 0504) BP: 138/97 mmHg (01/09 0504) Pulse Rate: 93 (01/09 0504)  Labs:  Recent Labs  02/04/15 0731 02/05/15 0320 02/06/15 0543  HGB 10.8* 11.5* 11.9*  HCT 33.6* 36.3* 37.8*  PLT 99* 106* 142*  LABPROT 26.4* 26.4* 26.2*  INR 2.46* 2.46* 2.44*  CREATININE 2.49* 2.50* 2.46*    Estimated Creatinine Clearance: 28.3 mL/min (by C-G formula based on Cr of 2.46).  Assessment: 70 yo male here w/ with abdominal pain, weight loss. He is on coumadin PTA for afib and admit INR was 5.94. INR today is 2.44 and at goal.  Home coumadin dose 2.5mg /day  Goal of Therapy:  INR 2-3 Monitor platelets by anticoagulation protocol: Yes   Plan:  - Coumadin 1.5 mg po x 1 tonight. - Daily INR  Harland German, Pharm D 02/06/2015 10:21 AM

## 2015-02-07 LAB — BASIC METABOLIC PANEL
Anion gap: 10 (ref 5–15)
BUN: 45 mg/dL — AB (ref 6–20)
CALCIUM: 8.3 mg/dL — AB (ref 8.9–10.3)
CO2: 18 mmol/L — ABNORMAL LOW (ref 22–32)
CREATININE: 2.47 mg/dL — AB (ref 0.61–1.24)
Chloride: 108 mmol/L (ref 101–111)
GFR, EST AFRICAN AMERICAN: 29 mL/min — AB (ref >60)
GFR, EST NON AFRICAN AMERICAN: 25 mL/min — AB (ref >60)
Glucose, Bld: 431 mg/dL — ABNORMAL HIGH (ref 65–99)
Potassium: 3.9 mmol/L (ref 3.5–5.1)
SODIUM: 136 mmol/L (ref 135–145)

## 2015-02-07 LAB — GLUCOSE, CAPILLARY
GLUCOSE-CAPILLARY: 144 mg/dL — AB (ref 65–99)
Glucose-Capillary: 110 mg/dL — ABNORMAL HIGH (ref 65–99)

## 2015-02-07 LAB — PROTIME-INR
INR: 2.77 — AB (ref 0.00–1.49)
PROTHROMBIN TIME: 28.8 s — AB (ref 11.6–15.2)

## 2015-02-07 MED ORDER — WARFARIN SODIUM 1 MG PO TABS
1.0000 mg | ORAL_TABLET | Freq: Once | ORAL | Status: AC
  Administered 2015-02-07: 1 mg via ORAL
  Filled 2015-02-07: qty 1

## 2015-02-07 NOTE — Care Management Important Message (Signed)
Important Message  Patient Details  Name: WATARU ALVILLAR MRN: [id number] Date of Birth: 1945/11/06   Medicare Important Message Given:  Yes    Cherylann Parr, RN 02/07/2015, 1:57 PM

## 2015-02-07 NOTE — Progress Notes (Signed)
[facility] PROGRESS NOTE Subjective Pt still having trouble swallowing. Apparently food/liquid comes back up quickly.   Objective: Vital signs in last 24 hours: Temp:  [98.2 F (36.8 C)-98.7 F (37.1 C)] 98.2 F (36.8 C) (01/10 0624) Pulse Rate:  [83-95] 83 (01/10 0624) Resp:  [18-19] 18 (01/10 0624) BP: (113-134)/(89-92) 118/89 mmHg (01/10 0624) SpO2:  [100 %] 100 % (01/10 0624) Weight:  [69.6 kg (153 lb 7 oz)] 69.6 kg (153 lb 7 oz) (01/10 0624) Last BM Date:  (pt unable to tell RN)  Intake/Output from previous day: 01/09 0701 - 01/10 0700 In: 120 [P.O.:120] Out: 725 [Urine:725] Intake/Output this shift:    PE: General--NAD  Abdomen--soft and nontender  Lab Results:  Recent Labs  02/05/15 0320 02/06/15 0543  WBC 7.9 8.8  HGB 11.5* 11.9*  HCT 36.3* 37.8*  PLT 106* 142*   BMET  Recent Labs  02/05/15 0320 02/06/15 0543 02/07/15 0302  NA 148* 149* 136  K 4.0 4.3 3.9  CL 117* 115* 108  CO2 22 24 18*  CREATININE 2.50* 2.46* 2.47*   LFT No results for input(s): PROT, AST, ALT, ALKPHOS, BILITOT, BILIDIR, IBILI in the last 72 hours. PT/INR  Recent Labs  02/05/15 0320 02/06/15 0543 02/07/15 0302  LABPROT 26.4* 26.2* 28.8*  INR 2.46* 2.44* 2.77*   PANCREAS  Recent Labs  02/05/15 0320  LIPASE 133*         Studies/Results: No results found.  Medications: I have reviewed the patient's current medications.  Assessment/Plan: 1. Dysphagia. Reviewed BS, dilated esophagus. No improvement with EGD and empiric dilation to 16 mm. Very suggestive of achalasia for manometry at [facility] tomorrow. Would stop reglan   Afia Messenger JR,Kealani Leckey L 02/07/2015, 8:37 AM  Pager: [phone] If no answer or after hours call [phone]

## 2015-02-07 NOTE — Progress Notes (Signed)
Family Medicine Teaching Service Daily Progress Note Intern Pager: [phone]  Patient name: Brian Burns Medical record number: [id number] Date of birth: Dec 04, 1945 Age: 70 y.o. Gender: male  Primary Care Provider: Shirlee Latch, MD Consultants: Nephrology, GI Code Status: DNR  Pt Overview and Major Events to Date:  12/27: admit for abdominal pain 12/28: PT recommended SNF  12/29: [facility] to follow from distance  1/1: SLP evaluation  1/3: MRI with no acute CVA 1/4: Barium swallow test 1/5: Esophageal dilation  1/8: started creon  Assessment and Plan: Brian Burns is a 70 y.o. male presenting with abdominal pain, weight loss. PMH is significant for urethral strictures, CAD, paroxysmal afib on warfarin, CKD, HTN, CVA x2, BPH, DM 2, AAA.   Failure to thrive, mod malnutrition in context of chronic illness & poor by mouth: still anorexic with nausea. Had EGD negative for lesion. Post empiric esophageal dilatation on 1/5. FOBT neg. - appreciate GI recs. Following further recs for plan after manometry  -Manometry on 02/08/2015 - started lexapro on 1/6 - Continue Remeron to stimulate appetite - Continue CREON. Started on 01/08 - stopped reglan  - Nutrition consulted, appreciate recs - PT/OT recommend SNF placement. Refused physical therapy on 01/04.  - SW on board  History of chronic pancreatitis: lipase 380 on admission. Trended down to 133 -Creon 1200 mg three times a day. - Anemia: Hgb trending up. LDH suggests hemolysis  - FOBT neg.  Hypernatremia, hyperchloremia:  Resolved. - D/cd free water - continue to monitor BMP - Continue to encourage PO   Constipation: achieved bowel movement on 1/1 with soap suds enema. He has been refusing treatments.  - continue colace, changed to daily - soap sud enema if patient agrees  AKI on CKD3: improving. Cr 2.47. B/l 1-1.4. - Monitor BMP - Outpt nephrology  BPH/Urethral strictures: stable   - continue foley - monitor  UOP - will need urology follow up as an outpatient  Paroxysmal afib: in NSR currently, anticoagulated with warfarin. INR in therapeutic range. - follow INR - warfarin per pharm  CAD,Hx of CVA's: had word finding and some balance problems with ongoing swallowing issues. CT head with atrophy with small vessel chronic ischemic changes of deep cerebral WM, and old infarcts in cerebellum and BG. Gait issue could also be due to malnutrition. Doubt vit B12 def with MCV of 87%.  MRI 01/03 with signs of chronic infarct but no acute intracranial process.  -Continue physical therapy and nutrition -Continue home atorvastatin and aspirin  Left lung nodule: extensive smoking history. Observed on CT on 12/27 - repeat CT scan in 1 year   FEN/GI:  -saline lock -NPO after midnight -Protonix and tums    Prophylaxis: on warfarin  Disposition: to [facility] for manometry on 01/11. Final dispo possibly SNF  Subjective: Reports abdominal pain. This is similar to his pain for the last few days.   Objective: Temp:  [98.2 F (36.8 C)-98.7 F (37.1 C)] 98.2 F (36.8 C) (01/10 0624) Pulse Rate:  [83-95] 83 (01/10 0624) Resp:  [18-19] 18 (01/10 0624) BP: (113-134)/(89-92) 118/89 mmHg (01/10 0624) SpO2:  [100 %] 100 % (01/10 0624) Weight:  [153 lb 7 oz (69.6 kg)] 153 lb 7 oz (69.6 kg) (01/10 6808)   Physical Exam: General: sleeping in bed Cardiovascular: RRR, no murmurs Respiratory: CTAB, no wheezes or crackles Abdomen: soft, BS+, tender to palpation over LUQ (unchanged exam) Extremities: no LE edema or tenderness Skin: Tenting skin Neuro: AAO to person & place.  Laboratory:  Recent Labs Lab 02/04/15 0731 02/05/15 0320 02/06/15 0543  WBC 6.4 7.9 8.8  HGB 10.8* 11.5* 11.9*  HCT 33.6* 36.3* 37.8*  PLT 99* 106* 142*    Recent Labs Lab 02/05/15 0320 02/06/15 0543 02/07/15 0302  NA 148* 149* 136  K 4.0 4.3 3.9  CL 117* 115* 108  CO2 22 24 18*  BUN 47* 47* 45*  CREATININE 2.50* 2.46*  2.47*  CALCIUM 8.9 9.0 8.3*  GLUCOSE 121* 141* 431*    Brian Hercules, MD 02/07/2015, 8:12 AM PGY-1 [facility] Family Medicine FPTS Intern pager: [phone], text pages welcome

## 2015-02-07 NOTE — Progress Notes (Signed)
UR Completed. Oreatha Fabry, RN, BSN.  [phone] 

## 2015-02-07 NOTE — Progress Notes (Signed)
ANTICOAGULATION CONSULT NOTE - Follow Up Consult  Pharmacy Consult for Coumadin Indication: AFIB + CVAs  Allergies  Allergen Reactions  . Novocain [Procaine Hcl] Nausea And Vomiting    Patient Measurements: Height: 6\' 4"  (193 cm) Weight: 153 lb 7 oz (69.6 kg) IBW/kg (Calculated) : 86.8  Vital Signs: Temp: 98.2 F (36.8 C) (01/10 0624) Temp Source: Oral (01/10 0624) BP: 118/89 mmHg (01/10 0624) Pulse Rate: 83 (01/10 0624)  Labs:  Recent Labs  02/05/15 0320 02/06/15 0543 02/07/15 0302  HGB 11.5* 11.9*  --   HCT 36.3* 37.8*  --   PLT 106* 142*  --   LABPROT 26.4* 26.2* 28.8*  INR 2.46* 2.44* 2.77*  CREATININE 2.50* 2.46* 2.47*    Estimated Creatinine Clearance: 28.6 mL/min (by C-G formula based on Cr of 2.47).  Assessment: 70 yo male here w/ with abdominal pain, weight loss. He is on coumadin PTA for afib and admit INR was 5.94. INR today is 2.77 and at goal. Coumadin requirements have been significant less while admitted (due to poor po intake and likely vitamin K deficiency. He is noted to go to [facility] 1/11 for esophageal manometry.  Home coumadin dose 2.5mg /day  Goal of Therapy:  INR 2-3 Monitor platelets by anticoagulation protocol: Yes   Plan:  - Coumadin 1 mg po x 1 tonight. - Daily INR  Harland German, Pharm D 02/07/2015 11:24 AM

## 2015-02-08 ENCOUNTER — Encounter (HOSPITAL_COMMUNITY)
Admission: EM | Disposition: A | Discharge status: Skilled Nursing Facility | Payer: Self-pay | Source: Home / Self Care | Attending: Family Medicine

## 2015-02-08 ENCOUNTER — Encounter (HOSPITAL_COMMUNITY): Payer: Self-pay | Admitting: General Surgery

## 2015-02-08 LAB — BASIC METABOLIC PANEL
Anion gap: 12 (ref 5–15)
BUN: 47 mg/dL — AB (ref 6–20)
CALCIUM: 8.8 mg/dL — AB (ref 8.9–10.3)
CHLORIDE: 115 mmol/L — AB (ref 101–111)
CO2: 19 mmol/L — AB (ref 22–32)
CREATININE: 2.45 mg/dL — AB (ref 0.61–1.24)
GFR calc non Af Amer: 26 mL/min — ABNORMAL LOW (ref >60)
GFR, EST AFRICAN AMERICAN: 30 mL/min — AB (ref >60)
GLUCOSE: 141 mg/dL — AB (ref 65–99)
Potassium: 4 mmol/L (ref 3.5–5.1)
Sodium: 146 mmol/L — ABNORMAL HIGH (ref 135–145)

## 2015-02-08 LAB — PROTIME-INR
INR: 3.34 — ABNORMAL HIGH (ref 0.00–1.49)
PROTHROMBIN TIME: 33.2 s — AB (ref 11.6–15.2)

## 2015-02-08 SURGERY — INVASIVE LAB ABORTED CASE

## 2015-02-08 MED ORDER — PROMETHAZINE HCL 25 MG/ML IJ SOLN
6.2500 mg | INTRAMUSCULAR | Status: DC | PRN
  Filled 2015-02-08 (×2): qty 1

## 2015-02-08 MED ORDER — DEXTROSE 5 % IV SOLN
INTRAVENOUS | Status: AC
  Administered 2015-02-08 (×2): via INTRAVENOUS

## 2015-02-08 MED ORDER — SODIUM CHLORIDE 0.9 % IV SOLN
INTRAVENOUS | Status: DC
  Administered 2015-02-09 – 2015-02-11 (×3): via INTRAVENOUS

## 2015-02-08 SURGICAL SUPPLY — 2 items
FACESHIELD LNG OPTICON STERILE (SAFETY) IMPLANT
GLOVE BIO SURGEON STRL SZ8 (GLOVE) ×8 IMPLANT

## 2015-02-08 NOTE — Progress Notes (Signed)
Family Medicine Teaching Service Daily Progress Note Intern Pager: [phone]  Patient name: Brian Burns Medical record number: [id number] Date of birth: January 04, 1946 Age: 70 y.o. Gender: male  Primary Care Provider: Shirlee Latch, MD Consultants: Nephrology, GI Code Status: DNR  Pt Overview and Major Events to Date:  12/27: admit for abdominal pain 12/28: PT recommended SNF  12/29: [facility] to follow from distance  1/1: SLP evaluation  1/3: MRI with no acute CVA 1/4: Barium swallow test 1/5: Esophageal dilation  1/8: started creon  Assessment and Plan: Brian Burns is a 70 y.o. male presenting with abdominal pain, weight loss. PMH is significant for urethral strictures, CAD, paroxysmal afib on warfarin, CKD, HTN, CVA x2, BPH, DM 2, AAA.   Failure to thrive, mod malnutrition in context of chronic illness & poor by mouth: still anorexic with nausea. Had EGD negative for lesion. Post empiric esophageal dilatation on 1/5. FOBT neg. - f/u further GI recs. Patient failed manometry today. GI to come-by and talk to him - started lexapro on 1/6 - Continue Remeron to stimulate appetite - Continue CREON. Started on 01/08 - Nutrition consulted, appreciate recs - PT/OT recommend SNF placement. Refused physical therapy on 01/04.  - SW on board  History of chronic pancreatitis: lipase 380 on admission. Trended down to 133 -Creon 1200 mg three times a day. - Anemia: Hgb trending up. LDH suggests hemolysis  - FOBT neg.  Hypernatremia, hyperchloremia:  Na 146 again - start D5 at 125 ml/hr for 16 hrs. - continue to monitor BMP - Continue to encourage PO   Hypertension:  Hypertensive this morning -Continue amlodipine 10 mg daily -Continue hydralazine as needed.  Constipation: achieved bowel movement on 1/1 with soap suds enema. He has been refusing treatments.  - continue colace, changed to daily - soap sud enema if patient agrees  AKI on CKD3: improving. Cr 2.47. B/l 1-1.4. -  Monitor BMP - Outpt nephrology  BPH/Urethral strictures: stable   - continue foley - monitor UOP - will need urology follow up as an outpatient  Paroxysmal afib: in NSR currently, anticoagulated with warfarin. INR mildly elevated today - follow INR - warfarin per pharm  CAD,Hx of CVA's: had word finding and some balance problems with ongoing swallowing issues. CT head with atrophy with small vessel chronic ischemic changes of deep cerebral WM, and old infarcts in cerebellum and BG. Gait issue could also be due to malnutrition. Doubt vit B12 def with MCV of 87%.  MRI 01/03 with signs of chronic infarct but no acute intracranial process.  -Continue physical therapy and nutrition -Continue home atorvastatin and aspirin  Left lung nodule: extensive smoking history. Observed on CT on 12/27 - repeat CT scan in 1 year   FEN/GI:  -saline lock -NPO after midnight -Protonix and tums    Prophylaxis: on warfarin  Disposition: failed manometry. Final dispo SNF pending GI recs  Subjective: Patient back in the room. He didn't tolerate manometry this morning. He is otherwise stable. Denies pain.   Objective: Temp:  [98.1 F (36.7 C)-98.3 F (36.8 C)] 98.3 F (36.8 C) (01/11 0557) Pulse Rate:  [90-109] 109 (01/11 0557) Resp:  [18-20] 20 (01/11 0557) BP: (117-146)/(87-114) 142/102 mmHg (01/11 0600) SpO2:  [99 %-100 %] 100 % (01/11 0557) Weight:  [146 lb 6.2 oz (66.4 kg)] 146 lb 6.2 oz (66.4 kg) (01/11 0557)   Physical Exam: General: sleeping in bed Cardiovascular: RRR, no murmurs Respiratory: CTAB, no wheezes or crackles Abdomen: soft, BS+, tender  to palpation over LUQ (unchanged exam) Extremities: no LE edema or tenderness Skin: Tenting skin Neuro: AAO to person & place.   Laboratory:  Recent Labs Lab 02/04/15 0731 02/05/15 0320 02/06/15 0543  WBC 6.4 7.9 8.8  HGB 10.8* 11.5* 11.9*  HCT 33.6* 36.3* 37.8*  PLT 99* 106* 142*    Recent Labs Lab 02/06/15 0543  02/07/15 0302 02/08/15 0515  NA 149* 136 146*  K 4.3 3.9 4.0  CL 115* 108 115*  CO2 24 18* 19*  BUN 47* 45* 47*  CREATININE 2.46* 2.47* 2.45*  CALCIUM 9.0 8.3* 8.8*  GLUCOSE 141* 431* 141*    Almon Hercules, MD 02/08/2015, 8:07 AM PGY-1 [facility] Family Medicine FPTS Intern pager: [phone], text pages welcome

## 2015-02-08 NOTE — Progress Notes (Signed)
The patient did allow me to talk to his daughter and I had a long discussion with her about the options. He does have multiple problems. They would like to found out more about the surgery so I have asked surgery to see him in consultation to evaluate the possibility of Heller myotomy and to explain the procedure. If surgery is felt not to be appropriate going forward, placement of a PEG tube or Botox injectionsorboth would be reasonable to consider.

## 2015-02-08 NOTE — Consult Note (Signed)
Brian Burns 1945/11/06  [id number].   Requesting MD: Dr. Laurence Spates Chief Complaint/Reason for Consult: Achalasia HPI: This is a 70 year old black male with multiple medical problems. He has been admitted for several weeks with a greater than 50 pound weight loss apparently per the patient's daughter. He was initially diagnosed with chronic pancreatitis. In the process over the last several weeks he has had failure to thrive. Prior to admission he was mobile, driving, doing his normal activities of daily living. He was eating normal food prior to admission but his appetite had decreased and he was having an increase in difficulty. All of this information is obtained from the patient's daughter. Since the patient has been admitted, he has continued to not eat. He has difficulty with swallowing. When he does swallow he tends to regurgitate. He did have an upper endoscopy which revealed a stricture at his LES. This was dilated up to 16 mm. Even after this dilatation the patient still did not eat solid food. Manometry was attempted today however due to some aggressive and belligerent behavior, this was unable to be completed. He did have a barium swallow as well. He was also somewhat uncooperative with this and difficult to truly determine whether he had dysmotility or not. We have been asked to evaluate the patient today for surgical opinion.  ROS : Please see history of present illness otherwise negative  Family History  Problem Relation Age of Onset  . Lung disease Brother   . Kidney disease Brother   . Heart disease Brother     died at 9  . Heart disease Mother   . Kidney disease Mother   . Lung disease Mother     Past Medical History  Diagnosis Date  . Hypertension   . HLD (hyperlipidemia)   . Erectile dysfunction   . Chronic back pain   . CAD (coronary artery disease)   . GERD (gastroesophageal reflux disease)   . Arthritis     knees,lower back  . Carotid artery disease ([place])    . PVD (peripheral vascular disease) with claudication ([place])   . AAA (abdominal aortic aneurysm) without rupture ([place]) 06/2014    no change in last exam from 2015  . History of CVA (cerebrovascular accident)     2009  &  2013  . Type 2 diabetes mellitus ([facility])     no meds per pt  . Atrial flutter with rapid ventricular response ([place]) 01/16/2015  . Atrial fibrillation ([place])   . Idiopathic pancreatitis 12/2014    acute    Past Surgical History  Procedure Laterality Date  . Esophagogastroduodenoscopy N/A 05/15/2014    Procedure: ESOPHAGOGASTRODUODENOSCOPY (EGD);  Surgeon: Laurence Spates, MD;  Location[facility] ENDOSCOPY;  Service: Endoscopy;  Laterality: N/A;  . Cataract extraction w/ intraocular lens  implant, bilateral  april and may 2014  . Transthoracic echocardiogram  01-07-2012    mild to moderate dilated LV,  ef 45-50%,  mild basal hypokinesis,  grade I diastolic  dysfunction/  trivial AR/  mild MR  . Cardiovascular stress test  08-09-2009    mild global hypokinesis,  no ischemia or scar/  ef 41%  . Cystoscopy with urethral dilatation N/A 11/24/2014    Procedure: CYSTOSCOPY WITH URETHRAL DILATATION;  Surgeon: Ardis Hughs, MD;  Location: [facility];  Service: Urology;  Laterality: N/A;  BALLOON DILATION        . Cystoscopy with retrograde urethrogram N/A 11/24/2014    Procedure: CYSTOSCOPY WITH RETROGRADE URETHROGRAM;  Surgeon: Ardis Hughs, MD;  Location: [facility];  Service: Urology;  Laterality: N/A;  . Esophagogastroduodenoscopy Left 02/02/2015    Procedure: ESOPHAGOGASTRODUODENOSCOPY (EGD);  Surgeon: Teena Irani, MD;  Location: [facility] ENDOSCOPY;  Service: Endoscopy;  Laterality: Left;    Social History:  reports that he quit smoking about 11 years ago. His smoking use included Cigarettes. He has a 20 pack-year smoking history. He has never used smokeless tobacco. He reports that he does not drink alcohol or use illicit drugs.  Allergies:   Allergies  Allergen Reactions  . Novocain [Procaine Hcl] Nausea And Vomiting    Medications Prior to Admission  Medication Sig Dispense Refill  . amLODipine (NORVASC) 10 MG tablet Take 1 tablet (10 mg total) by mouth daily. 90 tablet 1  . aspirin EC 81 MG tablet Take 81 mg by mouth daily.    Marland Kitchen atorvastatin (LIPITOR) 40 MG tablet Take 1 tablet (40 mg total) by mouth daily. 90 tablet 1  . doxycycline (VIBRA-TABS) 100 MG tablet Take 1 tablet (100 mg total) by mouth every 12 (twelve) hours. (Patient not taking: Reported on 01/24/2015) 11 tablet 0  . finasteride (PROSCAR) 5 MG tablet Take 1 tablet (5 mg total) by mouth daily. (Patient not taking: Reported on 01/22/2015) 30 tablet 1  . gabapentin (NEURONTIN) 100 MG capsule Take 1 capsule (100 mg total) by mouth 3 (three) times daily. (Patient not taking: Reported on 01/14/2015) 180 capsule 2  . HYDROcodone-acetaminophen (NORCO) 5-325 MG tablet Take 1-2 tablets by mouth every 4 (four) hours as needed for severe pain. (Patient not taking: Reported on 01/24/2015) 10 tablet 0  . meclizine (ANTIVERT) 25 MG tablet Take 1 tablet (25 mg total) by mouth 3 (three) times daily as needed for dizziness. (Patient not taking: Reported on 01/24/2015) 30 tablet 1  . metFORMIN (GLUCOPHAGE) 500 MG tablet Take 1 tablet (500 mg total) by mouth daily. (Patient not taking: Reported on 01/14/2015) 180 tablet 3  . omeprazole (PRILOSEC) 20 MG capsule Take 1 capsule (20 mg total) by mouth daily. (Patient not taking: Reported on 01/24/2015) 30 capsule 0  . ondansetron (ZOFRAN) 4 MG tablet Take 1 tablet (4 mg total) by mouth every 6 (six) hours as needed for nausea or vomiting. (Patient not taking: Reported on 01/24/2015) 12 tablet 0  . phenazopyridine (PYRIDIUM) 200 MG tablet Take 1 tablet (200 mg total) by mouth 3 (three) times daily as needed for pain. (Patient not taking: Reported on 01/14/2015) 10 tablet 0  . sucralfate (CARAFATE) 1 G tablet Take 1 tablet (1 g total) by  mouth 3 (three) times daily as needed. (Patient not taking: Reported on 01/24/2015) 30 tablet 0  . tamsulosin (FLOMAX) 0.4 MG CAPS capsule Take 1 capsule (0.4 mg total) by mouth daily. (Patient not taking: Reported on 01/14/2015) 30 capsule 3  . warfarin (COUMADIN) 2.5 MG tablet Take 1 tablet (2.5 mg total) by mouth daily. Starting 12/24, then as directed from INR check on 12/27 (Patient not taking: Reported on 01/24/2015) 20 tablet 0    Blood pressure 124/89, pulse 98, temperature 98.2 F (36.8 C), temperature source Oral, resp. rate 18, height '6\' 4"'  (1.93 m), weight 66.4 kg (146 lb 6.2 oz), SpO2 100 %. Physical Exam: General: pleasant, skinny black male who is laying in bed in NAD HEENT: head is normocephalic, atraumatic.  Sclera are noninjected.  PERRL.  Ears and nose without any masses or lesions.  Mouth is pink and moist Heart: regular, rate, and rhythm.  Normal s1,s2. No obvious murmurs, gallops, or rubs noted.  Palpable radial and pedal pulses bilaterally Lungs: CTAB, no wheezes, rhonchi, or rales noted.  Respiratory effort nonlabored Abd: soft, NT, ND, +BS, no masses, hernias, or organomegaly MS: all 4 extremities are symmetrical with no cyanosis, clubbing, or edema. Skin: warm and dry with no masses, lesions, or rashes Psych: Alert but stays quiet and his daughter does all the talking.    Results for orders placed or performed during the hospital encounter of 01/24/15 (from the past 48 hour(s))  Glucose, capillary     Status: Abnormal   Collection Time: 02/06/15  8:24 PM  Result Value Ref Range   Glucose-Capillary 166 (H) 65 - 99 mg/dL   Comment 1 Notify RN    Comment 2 Document in Chart   Protime-INR     Status: Abnormal   Collection Time: 02/07/15  3:02 AM  Result Value Ref Range   Prothrombin Time 28.8 (H) 11.6 - 15.2 seconds   INR 2.77 (H) 0.00 - 0.26  Basic metabolic panel     Status: Abnormal   Collection Time: 02/07/15  3:02 AM  Result Value Ref Range   Sodium 136  135 - 145 mmol/L    Comment: DELTA CHECK NOTED   Potassium 3.9 3.5 - 5.1 mmol/L   Chloride 108 101 - 111 mmol/L   CO2 18 (L) 22 - 32 mmol/L   Glucose, Bld 431 (H) 65 - 99 mg/dL   BUN 45 (H) 6 - 20 mg/dL   Creatinine, Ser 2.47 (H) 0.61 - 1.24 mg/dL   Calcium 8.3 (L) 8.9 - 10.3 mg/dL   GFR calc non Af Amer 25 (L) >60 mL/min   GFR calc Af Amer 29 (L) >60 mL/min    Comment: (NOTE) The eGFR has been calculated using the CKD EPI equation. This calculation has not been validated in all clinical situations. eGFR's persistently <60 mL/min signify possible Chronic Kidney Disease.    Anion gap 10 5 - 15  Glucose, capillary     Status: Abnormal   Collection Time: 02/07/15  6:21 AM  Result Value Ref Range   Glucose-Capillary 144 (H) 65 - 99 mg/dL   Comment 1 Notify RN    Comment 2 Document in Chart   Glucose, capillary     Status: Abnormal   Collection Time: 02/07/15 11:32 AM  Result Value Ref Range   Glucose-Capillary 110 (H) 65 - 99 mg/dL  Protime-INR     Status: Abnormal   Collection Time: 02/08/15  5:15 AM  Result Value Ref Range   Prothrombin Time 33.2 (H) 11.6 - 15.2 seconds   INR 3.34 (H) 0.00 - 3.78  Basic metabolic panel     Status: Abnormal   Collection Time: 02/08/15  5:15 AM  Result Value Ref Range   Sodium 146 (H) 135 - 145 mmol/L   Potassium 4.0 3.5 - 5.1 mmol/L   Chloride 115 (H) 101 - 111 mmol/L   CO2 19 (L) 22 - 32 mmol/L   Glucose, Bld 141 (H) 65 - 99 mg/dL   BUN 47 (H) 6 - 20 mg/dL   Creatinine, Ser 2.45 (H) 0.61 - 1.24 mg/dL   Calcium 8.8 (L) 8.9 - 10.3 mg/dL   GFR calc non Af Amer 26 (L) >60 mL/min   GFR calc Af Amer 30 (L) >60 mL/min    Comment: (NOTE) The eGFR has been calculated using the CKD EPI equation. This calculation has not been validated in all clinical situations.  eGFR's persistently <60 mL/min signify possible Chronic Kidney Disease.    Anion gap 12 5 - 15   No results found.     Assessment/Plan 1. Dysphagia, questionable achalasia -Dr.  Rosendo Gros and I have evaluated the patient. After a long discussion with the daughter as well as the patient, we are concerned about proceeding with any type of surgical intervention as his symptoms have been going on for a fairly Short period of time and with no manometry to confirm achalasia, this would not be recommended. We agree that he does need some type of nutrition. Given his combative behaviors, it may be best to go ahead and place a PICC line and start the patient on TNA. This decision will be deferred to the primary service as well as gastroenterology. However if at some point the patient needed a Heller myotomy, we would recommend not placing a PEG tube as this would prevent [place] from being able to do this type of an operation. The patient truly does need manometry prior to proceeding with an operation. Hopefully if the patient get started on nutrition such as TNA, he will begin to improve and can get manometry in the future. If at that time he has manometry, and this reveals achalasia, and the patient can follow-up with [place] in our office to discuss an operation. No further surgical plans at this time. Please call us with questions. Quadarius Henton E 02/08/2015, 4:27 PM Pager: [phone]

## 2015-02-08 NOTE — Progress Notes (Signed)
Pt to [facility] for manometry. Explained procedure to patient and daughter at bedside. Pt verbalized understanding. Attempted to place Esophageal manometry probe several times and by 2nd RN, Candy Sledge RN. Pt kept pulling away and when probe part of the way down in kept pulling probe out. Daughter tried to encourage pt. Pt kept stating he could not do this. Manometry not done per patient refusing. Dr. [name] made aware by Candy Sledge RN.  Pt to return to room at [facility].

## 2015-02-08 NOTE — Progress Notes (Signed)
Patient lying in bed, no pain or distress at this time. Call light within reach

## 2015-02-08 NOTE — Progress Notes (Signed)
ANTICOAGULATION CONSULT NOTE - Follow Up Consult  Pharmacy Consult for Coumadin Indication: AFIB + CVAs  Allergies  Allergen Reactions  . Novocain [Procaine Hcl] Nausea And Vomiting    Patient Measurements: Height: 6\' 4"  (193 cm) Weight: 146 lb 6.2 oz (66.4 kg) IBW/kg (Calculated) : 86.8  Vital Signs: Temp: 98.3 F (36.8 C) (01/11 0557) Temp Source: Oral (01/11 0557) BP: 142/102 mmHg (01/11 0600) Pulse Rate: 109 (01/11 0557)  Labs:  Recent Labs  02/06/15 0543 02/07/15 0302 02/08/15 0515  HGB 11.9*  --   --   HCT 37.8*  --   --   PLT 142*  --   --   LABPROT 26.2* 28.8* 33.2*  INR 2.44* 2.77* 3.34*  CREATININE 2.46* 2.47* 2.45*    Estimated Creatinine Clearance: 27.5 mL/min (by C-G formula based on Cr of 2.45).  Assessment: 70 yo male here w/ with abdominal pain, weight loss. He is on coumadin PTA for afib and admit INR was 5.94. INR today is 3.34 and above goal. Coumadin requirements have been significant less while admitted (due to poor po intake and likely vitamin K deficiency).   Home coumadin dose 2.5mg /day  Goal of Therapy:  INR 2-3 Monitor platelets by anticoagulation protocol: Yes   Plan:  -Hold coumadin today - Daily INR  Harland German, Pharm D 02/08/2015 10:28 AM

## 2015-02-08 NOTE — Progress Notes (Signed)
[facility] PROGRESS NOTE Subjective patient went to [facility] this morning for esophageal manometry. The probe was attempted to be passed that he became agitated and belligerent and they were unable to pass the manometry probe. He still is having trouble swallowing and not eating.  Objective: Vital signs in last 24 hours: Temp:  [98.1 F (36.7 C)-98.3 F (36.8 C)] 98.2 F (36.8 C) (01/11 1310) Pulse Rate:  [90-109] 98 (01/11 1310) Resp:  [18-20] 18 (01/11 1310) BP: (117-146)/(87-114) 124/89 mmHg (01/11 1310) SpO2:  [99 %-100 %] 100 % (01/11 1310) Weight:  [66.4 kg (146 lb 6.2 oz)] 66.4 kg (146 lb 6.2 oz) (01/11 0557) Last BM Date:  (pt doesn't know)  Intake/Output from previous day: 01/10 0701 - 01/11 0700 In: 280 [P.O.:280] Out: 625 [Urine:625] Intake/Output this shift:    PE: General-- no acute distress  Abdomen-- nontender  Lab Results:  Recent Labs  02/06/15 0543  WBC 8.8  HGB 11.9*  HCT 37.8*  PLT 142*   BMET  Recent Labs  02/06/15 0543 02/07/15 0302 02/08/15 0515  NA 149* 136 146*  K 4.3 3.9 4.0  CL 115* 108 115*  CO2 24 18* 19*  CREATININE 2.46* 2.47* 2.45*   LFT No results for input(s): PROT, AST, ALT, ALKPHOS, BILITOT, BILIDIR, IBILI in the last 72 hours. PT/INR  Recent Labs  02/06/15 0543 02/07/15 0302 02/08/15 0515  LABPROT 26.2* 28.8* 33.2*  INR 2.44* 2.77* 3.34*   PANCREAS No results for input(s): LIPASE in the last 72 hours.       Studies/Results: No results found.  Medications: I have reviewed the patient's current medications.  Assessment/Plan: 1. Dysphagia. This is probably due to achalasia based on his symptoms, barium swallow showing esophageal debilitation with narrowing at the GE junction. This motility was suggested. The patient was apparently somewhat uncooperative with the barium swallow. He had subsequent EGD and empiric dilatation by Dr. Madilyn Fireman with dilation of the LES to 16 mm with no  improvement in no obvious tumor or other abnormality seen. The esophagus was dilated. Speech pathology evaluation suggested esophageal dysphagia. I had a long discussion with the patient about the options. I told him that I thought that he had achalasia but we did not have a definitive diagnosis. We discussed the treatment options for this with the one that would make the most sense that his age, laparoscopic Heller myotomy. I told him that he would likely dive something was not done to improve his nutrition. We discussed the possibility of placing a nasogastric feeding tube intermittently. He stated he is not sure that he wants this. I offered to call his wife and family and discussed this with them and explain the options to them and he told me that he did not want me to call them that he wanted to tell them himself. He stated that he would let me know tomorrow what he was willing to do. Placement of PEG tube would be an option as well as initiation of empiric Botox injection into the LES, however, both of these choices have a downside. Botox injection is temporary and will require multiple injection with inflammatory response and probable scarring that would make future myotomy more difficult. PEG placement would fix the stomach to the abdominal wall and also make future surgery more difficult. If he is having trouble deciding what to do my strong suggestion to him this temporary placement of nasogastric tube in order to build up his nutrition, meet with surgeon, consider  an attempt to repeat manometry. He again told me not to call his family.   Bennett Vanscyoc JR,Virgilia Quigg L 02/08/2015, 1:38 PM  Pager: [phone] If no answer or after hours call [phone]

## 2015-02-08 NOTE — Care Management Note (Signed)
Case Management Note  Patient Details  Name: Brian Burns MRN: [id number] Date of Birth: [birth year]  Subjective/Objective:      Pt admitted for abdominal pain, pancreatitis              Update Action Plan:   Attempted manometry today at [facility], probe was unable to pass, procedure was unsuccessful -  pt was transported back to [facility].  CM meet with pt and family (wife and daughter) at bedside, pt communicated with family the concerns and recommendations, pt requested that GI MD explain to family in more detail.  CM contacted GI and facilitated phone call between MD and daughter.  Patient has scheduled esophageal manometry for this Wednesday 02/08/15, pt will transport to [facility]  CM contacted attending service 02/06/15 am to inquire of plan of care as pt did not have procedure.  CM was informed that GI had been re-consulted this am and will either move forward with botox injection, manometry or G tube placement.  Decision will be made by GI doctor.  CM contacted attending service 02/03/15 afternoon to inquire of plan of care; CM was informed that pt would possibly have manometry test over the weekend to determine if G tube was necessary.   Action/Plan:  Pt is from home with wife, daughter provided additional support.  Pt plans to discharge to SNF post nutritional need being met.   Expected Discharge Date:                  Expected Discharge Plan:  Skilled Nursing Facility  In-House Referral:  Clinical Social Work  Discharge planning Services  CM Consult  Post Acute Care Choice:    Choice offered to:     DME Arranged:    DME Agency:     HH Arranged:    [place] Agency:     Status of Service:  In process, will continue to follow  Medicare Important Message Given:  Yes Date Medicare IM Given:    Medicare IM give by:    Date Additional Medicare IM Given:    Additional Medicare Important Message give by:     If discussed at [place] of Stay Meetings, dates discussed:    Additional  Comments: CM assessed pt, daughter at bedside.  CM will continue to monitor for disposition needs [name], RN 02/08/2015, 3:24 PM

## 2015-02-09 ENCOUNTER — Inpatient Hospital Stay (HOSPITAL_COMMUNITY): Payer: Commercial Managed Care - HMO

## 2015-02-09 ENCOUNTER — Encounter (HOSPITAL_COMMUNITY): Payer: Self-pay | Admitting: Gastroenterology

## 2015-02-09 DIAGNOSIS — K220 Achalasia of cardia: Secondary | ICD-10-CM | POA: Insufficient documentation

## 2015-02-09 LAB — PROTIME-INR
INR: 3.89 — ABNORMAL HIGH (ref 0.00–1.49)
Prothrombin Time: 37.2 seconds — ABNORMAL HIGH (ref 11.6–15.2)

## 2015-02-09 LAB — BASIC METABOLIC PANEL
ANION GAP: 11 (ref 5–15)
BUN: 46 mg/dL — ABNORMAL HIGH (ref 6–20)
CALCIUM: 8.8 mg/dL — AB (ref 8.9–10.3)
CHLORIDE: 113 mmol/L — AB (ref 101–111)
CO2: 21 mmol/L — AB (ref 22–32)
CREATININE: 2.26 mg/dL — AB (ref 0.61–1.24)
GFR calc non Af Amer: 28 mL/min — ABNORMAL LOW (ref >60)
GFR, EST AFRICAN AMERICAN: 33 mL/min — AB (ref >60)
Glucose, Bld: 121 mg/dL — ABNORMAL HIGH (ref 65–99)
Potassium: 3.9 mmol/L (ref 3.5–5.1)
SODIUM: 145 mmol/L (ref 135–145)

## 2015-02-09 MED ORDER — HALOPERIDOL LACTATE 5 MG/ML IJ SOLN
INTRAMUSCULAR | Status: AC
  Administered 2015-02-09: 14:00:00
  Filled 2015-02-09: qty 1

## 2015-02-09 MED ORDER — IOHEXOL 300 MG/ML  SOLN
50.0000 mL | Freq: Once | INTRAMUSCULAR | Status: AC | PRN
  Administered 2015-02-09: 20 mL

## 2015-02-09 MED ORDER — HALOPERIDOL LACTATE 5 MG/ML IJ SOLN: 1.0000 mg | Freq: Four times a day (QID) | INTRAMUSCULAR | Status: DC | PRN

## 2015-02-09 MED ORDER — LIDOCAINE VISCOUS 2 % MT SOLN
OROMUCOSAL | Status: AC
  Administered 2015-02-09: 5 mL via NASAL
  Filled 2015-02-09: qty 15

## 2015-02-09 MED ORDER — HALOPERIDOL LACTATE 5 MG/ML IJ SOLN: 1.0000 mg | Freq: Once | INTRAMUSCULAR | Status: DC

## 2015-02-09 MED ORDER — JEVITY 1.2 CAL PO LIQD
1000.0000 mL | ORAL | Status: DC
  Administered 2015-02-10 – 2015-02-11 (×3): 1000 mL
  Filled 2015-02-09 (×6): qty 1000

## 2015-02-09 MED ORDER — MAGNESIUM HYDROXIDE 400 MG/5ML PO SUSP
15.0000 mL | Freq: Once | ORAL | Status: DC
  Filled 2015-02-09: qty 30

## 2015-02-09 MED ORDER — MAGNESIUM HYDROXIDE 400 MG/5ML PO SUSP: 15.0000 mL | Freq: Every day | ORAL | Status: DC | PRN

## 2015-02-09 NOTE — Progress Notes (Signed)
Patient lying in bed, family present at bedside Patient having nausea, gave prn Phenergan, call light within reach.

## 2015-02-09 NOTE — Progress Notes (Signed)
Family Medicine Teaching Service Daily Progress Note Intern Pager: [phone]  Patient name: Brian Burns Medical record number: [id number] Date of birth: 02-25-1945 Age: 70 y.o. Gender: male  Primary Care Provider: Shirlee Latch, MD Consultants: Nephrology, GI Code Status: DNR  Pt Overview and Major Events to Date:  12/27: admit for abdominal pain 12/28: PT recommended SNF  12/29: [facility] to follow from distance  1/1: SLP evaluation  1/3: MRI with no acute CVA 1/4: Barium swallow test 1/5: Esophageal dilation  1/8: started creon  Assessment and Plan: Brian Burns is a 70 y.o. male presenting with abdominal pain, weight loss. PMH is significant for urethral strictures, CAD, paroxysmal afib on warfarin, CKD, HTN, CVA x2, BPH, DM 2, AAA.   Failure to thrive, mod malnutrition in context of chronic illness & poor by mouth: still anorexic with nausea. Had EGD negative for lesion. Post empiric esophageal dilatation on 1/5. Couldn't tolerate manometry on 1/11. Surgery consulted by GI for Heller Myotomy but can't do this without manometry. They recommended PICC for TNA. - f/u further GI recs.   -radiology place nasogastric feeding tube and discharge. Patient agreeable to this. Daughter notified.  - If not successful, PICC for TPN per surgery - started lexapro on 1/6 - Continue Remeron to stimulate appetite - Continue CREON. Started on 01/08 - Maalox once for abdominal pain - Nutrition consulted, appreciate recs - PT/OT recommend SNF placement. Refused physical therapy on 01/04.  - SW on board  History of chronic pancreatitis: lipase 380 on admission. Trended down to 133 -Creon 1200 mg three times a day. - Anemia: Hgb trending up. LDH suggests hemolysis but haptoglobin within normal range. FOBT neg. -will monitor as needed  Hypernatremia, hyperchloremia:  Na 145 today - continue to monitor BMP - Continue to encourage PO   Hypertension:  Hypertensive this morning -Continue  amlodipine 10 mg daily -Continue hydralazine as needed.  Constipation: achieved bowel movement on 1/1 with soap suds enema. He has been refusing treatments.  - continue colace, changed to daily - soap sud enema if patient agrees  AKI on CKD3: improving. Cr 2.47. B/l 1-1.4. - Monitor BMP - Outpt nephrology  BPH/Urethral strictures: stable   - continue foley - monitor UOP - will need urology follow up as an outpatient  Paroxysmal afib: in NSR currently, anticoagulated with warfarin. Last INR mildly elevated - follow INR - warfarin per pharm  CAD,Hx of CVA's: had word finding and some balance problems with ongoing swallowing issues. CT head with atrophy with small vessel chronic ischemic changes of deep cerebral WM, and old infarcts in cerebellum and BG. Gait issue could also be due to malnutrition. Doubt vit B12 def with MCV of 87%.  MRI 01/03 with signs of chronic infarct but no acute intracranial process.  -Continue physical therapy and nutrition -Continue home atorvastatin and aspirin  Left lung nodule: extensive smoking history. Observed on CT on 12/27 - repeat CT scan in 1 year   FEN/GI:  -saline lock -NPO after midnight -Protonix and tums    Prophylaxis: on warfarin  Disposition: Final dispo SNF pending NGT placement.  Subjective: Patient reports abdominal pain. Describes it as warm. Continues to refuse by mouth.    Objective: Temp:  [98 F (36.7 C)-98.2 F (36.8 C)] 98 F (36.7 C) (01/12 0523) Pulse Rate:  [91-98] 91 (01/12 0523) Resp:  [18] 18 (01/12 0523) BP: (124-143)/(89-108) 143/108 mmHg (01/12 0523) SpO2:  [98 %-100 %] 100 % (01/12 0523) Weight:  [141 lb  5 oz (64.1 kg)] 141 lb 5 oz (64.1 kg) (01/12 0523)   Physical Exam: General: sleeping in bed, heaving Cardiovascular: RRR, no murmurs Respiratory: CTAB, no wheezes or crackles Abdomen: soft, BS+, tender to palpation over LUQ (unchanged exam) Extremities: no LE edema or tenderness Skin: Tenting  skin Neuro: AAO to person & place.   Laboratory:  Recent Labs Lab 02/04/15 0731 02/05/15 0320 02/06/15 0543  WBC 6.4 7.9 8.8  HGB 10.8* 11.5* 11.9*  HCT 33.6* 36.3* 37.8*  PLT 99* 106* 142*    Recent Labs Lab 02/06/15 0543 02/07/15 0302 02/08/15 0515  NA 149* 136 146*  K 4.3 3.9 4.0  CL 115* 108 115*  CO2 24 18* 19*  BUN 47* 45* 47*  CREATININE 2.46* 2.47* 2.45*  CALCIUM 9.0 8.3* 8.8*  GLUCOSE 141* 431* 141*    Almon Hercules, MD 02/09/2015, 6:47 AM PGY-1 [facility] Family Medicine FPTS Intern pager: [phone], text pages welcome

## 2015-02-09 NOTE — Progress Notes (Signed)
Physical Therapy Treatment Patient Details Name: Brian Burns MRN: [id number] DOB: [birth year] Today's Date: 02/09/2015    History of Present Illness KELDAN EPLIN is a 70 y.o. male presenting with abdominal pain, weight loss. PMH is significant for urethral strictures, CAD, paroxysmal afib, CKD, HTN, CVA x2, BPH, DM 2, AAA.     PT Comments    Progressing slowly, limited by low energy due to little PO intake.  Emphasized exercise in bed with graded resistance, transitions and progressive ambulation.  Follow Up Recommendations  SNF;Supervision/Assistance - 24 hour     Equipment Recommendations  Rolling walker with 5" wheels    Recommendations for Other Services       Precautions / Restrictions Precautions Precautions: Fall Restrictions Weight Bearing Restrictions: No    Mobility  Bed Mobility Overal bed mobility: Needs Assistance Bed Mobility: Supine to Sit;Sit to Supine   Sidelying to sit: Supervision   Sit to supine: Supervision      Transfers Overall transfer level: Needs assistance Equipment used: Rolling walker (2 wheeled) Transfers: Sit to/from Stand Sit to Stand: Min guard         General transfer comment: cues for hand placement and guard for safety  Ambulation/Gait Ambulation/Gait assistance: Min assist Ambulation Distance (Feet): 22 Feet Assistive device: Rolling walker (2 wheeled) Gait Pattern/deviations: Step-through pattern Gait velocity: decreased Gait velocity interpretation: Below normal speed for age/gender General Gait Details: gait slow and appearing mildly weak.  Trunk flexion   Stairs            Wheelchair Mobility    Modified Rankin (Stroke Patients Only)       Balance Overall balance assessment: Needs assistance   Sitting balance-Leahy Scale: Good     Standing balance support: Bilateral upper extremity supported Standing balance-Leahy Scale: Poor                      Cognition Arousal/Alertness:  Awake/alert Behavior During Therapy: WFL for tasks assessed/performed Overall Cognitive Status: Within Functional Limits for tasks assessed                      Exercises General Exercises - Lower Extremity Straight Leg Raises:  (graded resistance flexion and extention) Hip Flexion/Marching: AROM;Strengthening;Both;10 reps;Supine;Other (comment) (graded resistance flexion and extention)    General Comments        Pertinent Vitals/Pain Pain Assessment: No/denies pain    Home Living                      Prior Function            PT Goals (current goals can now be found in the care plan section) Acute Rehab PT Goals PT Goal Formulation: With patient Time For Goal Achievement: 02/16/15 Potential to Achieve Goals: Good Progress towards PT goals: Progressing toward goals (goals not met revising goals)    Frequency  Min 3X/week    PT Plan Current plan remains appropriate    Co-evaluation             End of Session   Activity Tolerance: Patient limited by fatigue Patient left: in bed;with call bell/phone within reach;with family/visitor present     Time: 5449-2010 PT Time Calculation (min) (ACUTE ONLY): 18 min  Charges:  $Gait Training: 8-22 mins                    G Codes:      Mana Haberl, Tessie Fass 02/09/2015,  12:54 PM  02/09/2015  Donnella Sham, [place] [phone]  (pager)

## 2015-02-09 NOTE — Progress Notes (Signed)
Nutrition Follow Up  DOCUMENTATION CODES:   Non-severe (moderate) malnutrition in context of chronic illness  INTERVENTION:    Once NGT placed, initiate Jevity 1.2 formula at 15 ml/hr and increase by 10 ml every 8 hours to goal rate of 65 ml/hr   Monitor Mg, Phos, K+ levels  TF regimen to provide 1872 kcals, 87 gm protien, 1259 ml of free water  NEW NUTRITION DIAGNOSIS:   Inadequate oral intake related to altered GI function as evidenced by meal completion < 25%, ongoing  GOAL:   Patient will meet greater than or equal to 90% of their needs, unmet  MONITOR:   TF tolerance, PO intake, Labs, Weight trends, I & O's  ASSESSMENT:   70 yo Male with PMH significant for urethral strictures, CAD, paroxysmal afib, CKD, HTN, CVA x2, BPH, DM 2, AAA; presented with abdominal pain and weight loss.  Patient continues on a Regular diet.  PO intake remains very poor.  Went to [facility] yesterday AM for esophageal manometry; probe was attempted to be passed, however, pt became agitated and belligerent and they were unable to pass the manometry probe.  Plan is for NGT placement per Radiology.  Haldol on board.  Received telephone with readback order from Dr. Nancy Marus to initiate Adult Tube Feeding Protocol once tube ready to be used.  Patient is at risk for refeeding syndrome given malnutrition.  Will advance TF slowly to goal rate.  Diet Order:  Diet regular Room service appropriate?: Yes; Fluid consistency:: Thin  Skin:  Reviewed, no issues  Last BM:  N/A  Height:   Ht Readings from Last 1 Encounters:  02/08/15 6\' 4"  (1.93 m)    Weight:   Wt Readings from Last 1 Encounters:  02/09/15 141 lb 5 oz (64.1 kg)    Ideal Body Weight:  91.8 kg  BMI:  Body mass index is 17.21 kg/(m^2).  Estimated Nutritional Needs:   Kcal:  1800-2000  Protein:  90-100 gm  Fluid:  1.8-2.0 L  EDUCATION NEEDS:   No education needs identified at this time  Brian Burns, RD, LDN Pager #:  [phone] After-Hours Pager #: [phone]

## 2015-02-09 NOTE — Progress Notes (Signed)
[facility] PROGRESS NOTE Subjective appreciate input from surgery. Patient has multiple medical problems and does not have a clear diagnosis of achalasia since he refused to cooperate with a manometry. He claims to have allergy to Novocain which prevented the use of numbing medication to place the manometry probe and he became belligerent preventing completion of the procedure.  Objective: Vital signs in last 24 hours: Temp:  [98 F (36.7 C)-98.2 F (36.8 C)] 98 F (36.7 C) (01/12 0523) Pulse Rate:  [91-98] 91 (01/12 0523) Resp:  [18] 18 (01/12 0523) BP: (124-143)/(89-108) 143/108 mmHg (01/12 0523) SpO2:  [98 %-100 %] 100 % (01/12 0523) Weight:  [64.1 kg (141 lb 5 oz)] 64.1 kg (141 lb 5 oz) (01/12 0523) Last BM Date:  (pt doesn't know)  Intake/Output from previous day: 01/11 0701 - 01/12 0700 In: 0  Out: 475 [Urine:475] Intake/Output this shift:    PE: General-- still spitting up in a bucket no acute distress   Lab Results: No results for input(s): WBC, HGB, HCT, PLT in the last 72 hours. BMET  Recent Labs  02/07/15 0302 02/08/15 0515  NA 136 146*  K 3.9 4.0  CL 108 115*  CO2 18* 19*  CREATININE 2.47* 2.45*   LFT No results for input(s): PROT, AST, ALT, ALKPHOS, BILITOT, BILIDIR, IBILI in the last 72 hours. PT/INR  Recent Labs  02/07/15 0302 02/08/15 0515  LABPROT 28.8* 33.2*  INR 2.77* 3.34*   PANCREAS No results for input(s): LIPASE in the last 72 hours.       Studies/Results: No results found.  Medications: I have reviewed the patient's current medications.  Assessment/Plan: 1. Dysphagia. Probably due to achalasia clinically and radio graphically but without confirmation with manometry. Choices for treatment are laparoscopic Heller myotomy, pneumatic bag dilatation, Botox injection of the LES, or placement of PEG tube. Unfortunately the patient has multiple medical problems. Heller myotomy or pneumatic bag dilatation would have  significant surgical risk. Botox injection or PEG placement would be less risky but would preclude future bag dilatation or myotomy. The main issue at this point is his nutrition  Recommendation: I would have radiology place nasogastric feeding tube and begin to pleading with enteral formula. He could go home with this in place. I will check on his previous procedure notes and see if he was given cetacaine spray. If he wasn't tolerated that we could attempt to repeat manometry in a week or 2 using cetacaine to obtain a firm diagnosis. During this time he could improve his nutritional status regardless of which ultimate solution is decided.   Eloyce Bultman JR,Edris Friedt L 02/09/2015, 7:59 AM  This note was created using voice recognition software. Minor errors may Have occurred unintentionally.  Pager: [phone] If no answer or after hours call [phone]

## 2015-02-09 NOTE — Progress Notes (Signed)
CSW will continue to follow for placement needs.  There are SNFs that accept NG tube or TPN if pt is requiring this for nutrition  Merlyn Lot, LCSWA Clinical Social Worker [phone]

## 2015-02-09 NOTE — Progress Notes (Signed)
Daughter in room visiting her father. Updated daughter r/t placement of NG feeding tube.

## 2015-02-09 NOTE — Progress Notes (Signed)
Received call from radiology, asked if RD had attempted placement of NG tube. Recommended I contact Kendell Bane, RD @ ext. # [phone]. If unsuccessful, contact radiology @ ext # [phone]. Attempted Ext [phone] - w/o answer.

## 2015-02-09 NOTE — Progress Notes (Signed)
ANTICOAGULATION CONSULT NOTE - Follow Up Consult  Pharmacy Consult for Coumadin Indication: AFIB + CVAs  Allergies  Allergen Reactions  . Novocain [Procaine Hcl] Nausea And Vomiting    Patient Measurements: Height: 6\' 4"  (193 cm) Weight: 141 lb 5 oz (64.1 kg) IBW/kg (Calculated) : 86.8  Vital Signs: Temp: 98 F (36.7 C) (01/12 0523) Temp Source: Oral (01/12 0523) BP: 143/108 mmHg (01/12 0523) Pulse Rate: 91 (01/12 0523)  Labs:  Recent Labs  02/07/15 0302 02/08/15 0515 02/09/15 0800  LABPROT 28.8* 33.2* 37.2*  INR 2.77* 3.34* 3.89*  CREATININE 2.47* 2.45* 2.26*    Estimated Creatinine Clearance: 28.8 mL/min (by C-G formula based on Cr of 2.26).  Assessment: 70 yo male here w/ with abdominal pain, weight loss. He is on coumadin PTA for afib and admit INR was 5.94 (home dose 2.5mg  daily).  INR today is 3.89- continues to rise. Coumadin requirements have been significant less while admitted (due to poor po intake and likely vitamin K deficiency).  No bleeding noted  Goal of Therapy:  INR 2-3 Monitor platelets by anticoagulation protocol: Yes   Plan:  -Hold coumadin today -Daily INR -CBC in AM  Yarisa Lynam D. Clorinda Wyble, PharmD, BCPS Clinical Pharmacist Pager: [phone] 02/09/2015 10:36 AM

## 2015-02-10 DIAGNOSIS — D619 Aplastic anemia, unspecified: Secondary | ICD-10-CM

## 2015-02-10 LAB — GLUCOSE, CAPILLARY
GLUCOSE-CAPILLARY: 133 mg/dL — AB (ref 65–99)
GLUCOSE-CAPILLARY: 151 mg/dL — AB (ref 65–99)
Glucose-Capillary: 119 mg/dL — ABNORMAL HIGH (ref 65–99)
Glucose-Capillary: 148 mg/dL — ABNORMAL HIGH (ref 65–99)
Glucose-Capillary: 155 mg/dL — ABNORMAL HIGH (ref 65–99)

## 2015-02-10 LAB — PROTIME-INR
INR: 3.68 — AB (ref 0.00–1.49)
PROTHROMBIN TIME: 35.7 s — AB (ref 11.6–15.2)

## 2015-02-10 MED ORDER — INSULIN ASPART 100 UNIT/ML ~~LOC~~ SOLN
0.0000 [IU] | Freq: Three times a day (TID) | SUBCUTANEOUS | Status: DC
  Administered 2015-02-10: 2 [IU] via SUBCUTANEOUS
  Administered 2015-02-10: 1 [IU] via SUBCUTANEOUS
  Administered 2015-02-11: 2 [IU] via SUBCUTANEOUS

## 2015-02-10 NOTE — Discharge Instructions (Addendum)
It has been a pleasure taking care of you! You were admitted due to weight loss, nausea, vomiting and poor appetite. The cause is still unclear. We suspect this is due to some problem with your oesophagus. We did multiple tests to figure out the cause. Unfortunately, you couldn't tolerate one of the tests that we hoped to help [place]. Gastroenterologist will reschedule for this test in two weeks. We are now discharging you with feeding tube and some medications. We may have made some adjustments to your other medications. Please, make sure to read the directions before you take them. The names and directions on how to take these medications are found on this discharge paper under medication section.  Take care,

## 2015-02-10 NOTE — Care Management Note (Signed)
Case Management Note  Patient Details  Name: Brian Burns MRN: [id number] Date of Birth: 1945/06/13  Subjective/Objective:      Pt admitted for abdominal pain, pancreatitis              Update Action Plan:  Pt had NG tube placed 02/09/15 in IR.  Pt currently has tube feeds going at 15 per hour, goal is 60 per hour, with advancement q 8 hours as long as pt is tolerating feeds.  Pt will discharge to SNF once tube feeds are at goal, CSW aware of current barrier with projection of being at goal tomorrow evening 02/11/15.     Attempted manometry today 02/08/15 at [facility], probe was unable to pass, procedure was unsuccessful -  pt was transported back to [facility].  CM meet with pt and family (wife and daughter) at bedside, pt communicated with family the concerns and recommendations, pt requested that GI MD explain to family in more detail.  CM contacted GI and facilitated phone call between MD and daughter.  Patient has scheduled esophageal manometry for this Wednesday 02/08/15, pt will transport to [place]  CM contacted attending service 02/06/15 am to inquire of plan of care as pt did not have procedure.  CM was informed that GI had been re-consulted this am and will either move forward with botox injection, manometry or G tube placement.  Decision will be made by GI doctor.  CM contacted attending service 02/03/15 afternoon to inquire of plan of care; CM was informed that pt would possibly have manometry test over the weekend to determine if G tube was necessary.   Action/Plan:  Pt is from home with wife, daughter provided additional support.  Pt plans to discharge to SNF post nutritional need being met.   Expected Discharge Date:                  Expected Discharge Plan:  Skilled Nursing Facility  In-House Referral:  Clinical Social Work  Discharge planning Services  CM Consult  Post Acute Care Choice:    Choice offered to:     DME Arranged:    DME Agency:     HH Arranged:    [place] Agency:     Status  of Service:  In process, will continue to follow  Medicare Important Message Given:  Yes Date Medicare IM Given:    Medicare IM give by:    Date Additional Medicare IM Given:    Additional Medicare Important Message give by:     If discussed at [place] of Stay Meetings, dates discussed:    Additional Comments: CM assessed pt, daughter at bedside.  CM will continue to monitor for disposition needs [name], RN 02/10/2015, 10:19 AM

## 2015-02-10 NOTE — Progress Notes (Addendum)
[facility] PROGRESS NOTE Subjective Pt had NG placed for feeding and will be discharged to SNF  Objective: Vital signs in last 24 hours: Temp:  [97.3 F (36.3 C)-98.8 F (37.1 C)] 98.4 F (36.9 C) (01/13 0543) Pulse Rate:  [82-102] 102 (01/13 0543) Resp:  [18] 18 (01/13 0543) BP: (125-147)/(75-108) 125/75 mmHg (01/13 0543) SpO2:  [100 %] 100 % (01/13 0543) Weight:  [61.4 kg (135 lb 5.8 oz)] 61.4 kg (135 lb 5.8 oz) (01/13 0543) Last BM Date:  (pt doesn't know)  Intake/Output from previous day: 01/12 0701 - 01/13 0700 In: 300.5 [P.O.:300.5] Out: 925 [Urine:925] Intake/Output this shift: Total I/O In: 379.8 [P.O.:240; NG/GT:139.8] Out: 650 [Urine:650]    Lab Results: No results for input(s): WBC, HGB, HCT, PLT in the last 72 hours. BMET  Recent Labs  02/08/15 0515 02/09/15 0800  NA 146* 145  K 4.0 3.9  CL 115* 113*  CO2 19* 21*  CREATININE 2.45* 2.26*   LFT No results for input(s): PROT, AST, ALT, ALKPHOS, BILITOT, BILIDIR, IBILI in the last 72 hours. PT/INR  Recent Labs  02/08/15 0515 02/09/15 0800 02/10/15 0320  LABPROT 33.2* 37.2* 35.7*  INR 3.34* 3.89* 3.68*   PANCREAS No results for input(s): LIPASE in the last 72 hours.       Studies/Results: Dg Abd 1 View  02/09/2015  CLINICAL DATA:  NG tube for feeding. EXAM: ABDOMEN - 1 VIEW COMPARISON:  CT abdomen and pelvis 01/24/2015. FINDINGS: The tip of a small bore feeding tube is in the duodenum at the junction of second and third portion of the duodenum. Placement was confirmed with water-soluble contrast. IMPRESSION: The tip of a small bore feeding tube is at the junction of the second and third portions of the duodenum. Electronically Signed   By: Marin Roberts M.D.   On: 02/09/2015 15:40   Dg Vangie Bicker G Tube Plc W/fl-no Rad  02/09/2015  CLINICAL DATA:  NASO G TUBE PLACEMENT WITH FLUORO Fluoroscopy was utilized by the requesting physician.  No radiographic interpretation.     Medications: I have reviewed the patient's current medications.  Assessment/Plan: 1. Dysphagia. Probably due to achalasia pt has not been able to do manometry. Is allergic to novacaine but I did a food impaction on him in 2015 and he had cetacaine spray without any problems confirmed by review of nurses notes. Long discussion with the pt. We will try the manometry again in about 2 weeks as OP with cetacaine spray. Regardless of what treatment he chooses in the future, he will need better nourishment.   Yazhini Mcaulay JR,Aidah Forquer L 02/10/2015, 11:59 AM  This note was created using voice recognition software. Minor errors may Have occurred unintentionally.  Pager: [phone] If no answer or after hours call [phone]  We will check the schedule and contact as outpatient to arrange another attempted manometry

## 2015-02-10 NOTE — Progress Notes (Signed)
ANTICOAGULATION CONSULT NOTE - Follow Up Consult  Pharmacy Consult for Coumadin Indication: AFIB + CVAs  Allergies  Allergen Reactions  . Novocain [Procaine Hcl] Nausea And Vomiting    Patient Measurements: Height: 6\' 4"  (193 cm) Weight: 135 lb 5.8 oz (61.4 kg) IBW/kg (Calculated) : 86.8  Vital Signs: Temp: 98.4 F (36.9 C) (01/13 0543) Temp Source: Oral (01/13 0543) BP: 125/75 mmHg (01/13 0543) Pulse Rate: 102 (01/13 0543)  Labs:  Recent Labs  02/08/15 0515 02/09/15 0800 02/10/15 0320  LABPROT 33.2* 37.2* 35.7*  INR 3.34* 3.89* 3.68*  CREATININE 2.45* 2.26*  --     Estimated Creatinine Clearance: 27.5 mL/min (by C-G formula based on Cr of 2.26).  Assessment: 70 yo male here w/ with abdominal pain, weight loss. He is on coumadin PTA for afib and admit INR was 5.94 (home dose 2.5mg  daily).  INR today is 3.68-Coumadin requirements have been significant less while admitted (due to poor po intake and likely vitamin K deficiency).    Goal of Therapy:  INR 2-3 Monitor platelets by anticoagulation protocol: Yes   Plan:  -Hold coumadin today -Daily INR  Harland German, Pharm D 02/10/2015 8:23 AM

## 2015-02-10 NOTE — Interval H&P Note (Signed)
History and Physical Interval Note:  02/10/2015 7:31 AM  Brian Burns  has presented today for surgery, with the diagnosis of dysphagia  The various methods of treatment have been discussed with the patient and family. After consideration of risks, benefits and other options for treatment, the patient has consented to  Procedure(s): ESOPHAGEAL MANOMETRY (EM) (N/A) as a surgical intervention .  The patient's history has been reviewed, patient examined, no change in status, stable for surgery.  I have reviewed the patient's chart and labs.  Questions were answered to the patient's satisfaction.     [name],Demetrius Barrell L

## 2015-02-10 NOTE — H&P (View-Only) (Signed)
[facility] PROGRESS NOTE Subjective appreciate input from surgery. Patient has multiple medical problems and does not have a clear diagnosis of achalasia since he refused to cooperate with a manometry. He claims to have allergy to Novocain which prevented the use of numbing medication to place the manometry probe and he became belligerent preventing completion of the procedure.  Objective: Vital signs in last 24 hours: Temp:  [98 F (36.7 C)-98.2 F (36.8 C)] 98 F (36.7 C) (01/12 0523) Pulse Rate:  [91-98] 91 (01/12 0523) Resp:  [18] 18 (01/12 0523) BP: (124-143)/(89-108) 143/108 mmHg (01/12 0523) SpO2:  [98 %-100 %] 100 % (01/12 0523) Weight:  [64.1 kg (141 lb 5 oz)] 64.1 kg (141 lb 5 oz) (01/12 0523) Last BM Date:  (pt doesn't know)  Intake/Output from previous day: 01/11 0701 - 01/12 0700 In: 0  Out: 475 [Urine:475] Intake/Output this shift:    PE: General-- still spitting up in a bucket no acute distress   Lab Results: No results for input(s): WBC, HGB, HCT, PLT in the last 72 hours. BMET  Recent Labs  02/07/15 0302 02/08/15 0515  NA 136 146*  K 3.9 4.0  CL 108 115*  CO2 18* 19*  CREATININE 2.47* 2.45*   LFT No results for input(s): PROT, AST, ALT, ALKPHOS, BILITOT, BILIDIR, IBILI in the last 72 hours. PT/INR  Recent Labs  02/07/15 0302 02/08/15 0515  LABPROT 28.8* 33.2*  INR 2.77* 3.34*   PANCREAS No results for input(s): LIPASE in the last 72 hours.       Studies/Results: No results found.  Medications: I have reviewed the patient's current medications.  Assessment/Plan: 1. Dysphagia. Probably due to achalasia clinically and radio graphically but without confirmation with manometry. Choices for treatment are laparoscopic Heller myotomy, pneumatic bag dilatation, Botox injection of the LES, or placement of PEG tube. Unfortunately the patient has multiple medical problems. Heller myotomy or pneumatic bag dilatation would have  significant surgical risk. Botox injection or PEG placement would be less risky but would preclude future bag dilatation or myotomy. The main issue at this point is his nutrition  Recommendation: I would have radiology place nasogastric feeding tube and begin to pleading with enteral formula. He could go home with this in place. I will check on his previous procedure notes and see if he was given cetacaine spray. If he wasn't tolerated that we could attempt to repeat manometry in a week or 2 using cetacaine to obtain a firm diagnosis. During this time he could improve his nutritional status regardless of which ultimate solution is decided.   Elene Downum JR,Carloyn Lahue L 02/09/2015, 7:59 AM  This note was created using voice recognition software. Minor errors may Have occurred unintentionally.  Pager: [phone] If no answer or after hours call [phone]   

## 2015-02-10 NOTE — Progress Notes (Signed)
Family Medicine Teaching Service Daily Progress Note Intern Pager: [phone]  Patient name: Brian Burns Medical record number: [id number] Date of birth: [birth year] Age: 70 y.o. Gender: male  Primary Care Provider: Shirlee Latch, MD Consultants: Nephrology, GI Code Status: DNR  Pt Overview and Major Events to Date:  12/27: admit for abdominal pain 12/28: PT recommended SNF  12/29: [facility] to follow from distance  1/1: SLP evaluation  1/3: MRI with no acute CVA 1/4: Barium swallow test 1/5: Esophageal dilation  1/8: started creon  Assessment and Plan: Brian Burns is a 70 y.o. male presenting with abdominal pain, weight loss. PMH is significant for urethral strictures, CAD, paroxysmal afib on warfarin, CKD, HTN, CVA x2, BPH, DM 2, AAA.   Failure to thrive, mod malnutrition in context of chronic illness & poor by mouth: still anorexic with nausea. Had EGD negative for lesion. Post empiric esophageal dilatation on 1/5. Couldn't tolerate manometry on 1/11. Surgery consulted by GI for Heller Myotomy but can't do this without manometry. They recommended PICC for TNA. - f/u further GI recs.   -NGT placed 01/12. Manometry outpatient in two weeks per Dr. [name] (phone). - Will get labs for Mg, P and CMP for refeeding syndrome if he stays - Will try maalox once for abdominal pain again - started lexapro on 1/6 - Continue Remeron to stimulate appetite - Continue CREON. Started on 01/08 - Nutrition consulted, appreciate recs - PT/OT recommend SNF placement. - SW on board  History of chronic pancreatitis: lipase 380 on admission. Trended down to 133 -Creon 1200 mg three times a day. - Anemia: Hgb trending up. LDH suggests hemolysis but haptoglobin within normal range. FOBT neg. -will monitor as needed  Hypernatremia, hyperchloremia: last Na 145. - continue to monitor BMP - Continue to encourage PO   Hypertension:  Hypertensive this morning -Continue amlodipine 10 mg  daily -Continue hydralazine as needed.  Constipation: achieved bowel movement on 1/1 with soap suds enema. He has been refusing treatments.  - continue colace, changed to daily - soap sud enema if patient agrees  AKI on CKD3: improving. Cr 2.47. B/l 1-1.4. - Monitor BMP - Outpt nephrology  Back pain: reported back pain this morning. Reports history of surgery in the past.  - Tylenol as needed   BPH/Urethral strictures: stable   - continue foley - monitor UOP - will need urology follow up as an outpatient  Paroxysmal afib: in NSR currently, anticoagulated with warfarin. Last INR mildly elevated - follow INR - warfarin per pharm  CAD,Hx of CVA's: had word finding and some balance problems with ongoing swallowing issues. CT head with atrophy with small vessel chronic ischemic changes of deep cerebral WM, and old infarcts in cerebellum and BG. Gait issue could also be due to malnutrition. Doubt vit B12 def with MCV of 87%.  MRI 01/03 with signs of chronic infarct but no acute intracranial process.  -Continue physical therapy and nutrition -Continue home atorvastatin and aspirin  Left lung nodule: extensive smoking history. Observed on CT on 12/27 - repeat CT scan in 1 year   FEN/GI:  -saline lock -NPO after midnight -Protonix and tums    Prophylaxis: on warfarin  Disposition: dispo SNF with NGT. There are SNF's that accept patients with NGT per SW. GI will schedule manometry in two weeks as outpatient per Dr. [name].  Subjective: Patient reports abdominal pain and back pain. Reports having back surgery in the past. He says maalox helped with abdominal pain yesterday.  Objective: Temp:  [97.3 F (36.3 C)-98.8 F (37.1 C)] 98.4 F (36.9 C) (01/13 0543) Pulse Rate:  [82-102] 102 (01/13 0543) Resp:  [18] 18 (01/13 0543) BP: (125-147)/(75-108) 125/75 mmHg (01/13 0543) SpO2:  [100 %] 100 % (01/13 0543) Weight:  [135 lb 5.8 oz (61.4 kg)] 135 lb 5.8 oz (61.4 kg) (01/13 0543)    Physical Exam: General: sleeping in bed Nares: NGT in place, no blood. Oropharynx: clear, moist CV: regular rate and rythm. S1 & S2 audible Resp: no apparent work of breathing, clear to auscultation bilaterally. GI: bowel sounds normal, no tenderness to palpation, no rebound or guarding, no mass.  GU: no suprapubic tenderness, or CVA tenderness Skin: no lesion MSK: no tenderness to palpation over his back.  Laboratory:  Recent Labs Lab 02/04/15 0731 02/05/15 0320 02/06/15 0543  WBC 6.4 7.9 8.8  HGB 10.8* 11.5* 11.9*  HCT 33.6* 36.3* 37.8*  PLT 99* 106* 142*    Recent Labs Lab 02/07/15 0302 02/08/15 0515 02/09/15 0800  NA 136 146* 145  K 3.9 4.0 3.9  CL 108 115* 113*  CO2 18* 19* 21*  BUN 45* 47* 46*  CREATININE 2.47* 2.45* 2.26*  CALCIUM 8.3* 8.8* 8.8*  GLUCOSE 431* 141* 121*    Almon Hercules, MD 02/10/2015, 7:15 AM PGY-1 [facility] Family Medicine FPTS Intern pager: [phone], text pages welcome

## 2015-02-10 NOTE — Consult Note (Signed)
   [facility] CM Inpatient Consult   02/10/2015  Brian Burns 13-Dec-1945 [id number] Patient was screened for long hospital stay and 2 admissions in 6 months.  Spoke with inpatient RNCM and she states that the patient will likely discharge to a skilled facility. Encouraged to call if anything changes. For questions, please contact: Charlesetta Shanks, RN BSN CCM [facility]  [phone] business mobile phone Toll free office [phone]

## 2015-02-10 NOTE — Care Management Important Message (Signed)
Important Message  Patient Details  Name: Brian Burns MRN: [id number] Date of Birth: [birth year]   Medicare Important Message Given:  Yes    Cherylann Parr, RN 02/10/2015, 10:19 AM

## 2015-02-10 NOTE — Progress Notes (Signed)
UR Completed. Jenna Routzahn, RN, BSN.  [phone] 

## 2015-02-10 NOTE — Progress Notes (Addendum)
[facility] can accept pt tomorrow with NG tube if medically stable- dtr is agreeable to placement at [facility] auth initiated  MD informed and agreeable to Saturday DC- DC summary needed before noon, DNR on chart to be signed for transport.  Merlyn Lot, LCSWA Clinical Social Worker [phone]

## 2015-02-11 ENCOUNTER — Encounter (HOSPITAL_COMMUNITY): Payer: Self-pay | Admitting: Emergency Medicine

## 2015-02-11 ENCOUNTER — Emergency Department (HOSPITAL_COMMUNITY)
Admission: EM | Admit: 2015-02-11 | Discharge: 2015-02-11 | Disposition: A | Discharge status: Home / Self Care | Payer: Medicare HMO | Attending: Emergency Medicine | Admitting: Emergency Medicine

## 2015-02-11 ENCOUNTER — Emergency Department (HOSPITAL_COMMUNITY): Payer: Medicare HMO

## 2015-02-11 DIAGNOSIS — I10 Essential (primary) hypertension: Secondary | ICD-10-CM | POA: Diagnosis not present

## 2015-02-11 DIAGNOSIS — M1990 Unspecified osteoarthritis, unspecified site: Secondary | ICD-10-CM | POA: Insufficient documentation

## 2015-02-11 DIAGNOSIS — N400 Enlarged prostate without lower urinary tract symptoms: Secondary | ICD-10-CM | POA: Diagnosis not present

## 2015-02-11 DIAGNOSIS — E785 Hyperlipidemia, unspecified: Secondary | ICD-10-CM | POA: Diagnosis not present

## 2015-02-11 DIAGNOSIS — N182 Chronic kidney disease, stage 2 (mild): Secondary | ICD-10-CM | POA: Diagnosis not present

## 2015-02-11 DIAGNOSIS — K920 Hematemesis: Secondary | ICD-10-CM | POA: Diagnosis not present

## 2015-02-11 DIAGNOSIS — Y732 Prosthetic and other implants, materials and accessory gastroenterology and urology devices associated with adverse incidents: Secondary | ICD-10-CM | POA: Diagnosis not present

## 2015-02-11 DIAGNOSIS — K253 Acute gastric ulcer without hemorrhage or perforation: Secondary | ICD-10-CM | POA: Diagnosis not present

## 2015-02-11 DIAGNOSIS — K9423 Gastrostomy malfunction: Secondary | ICD-10-CM | POA: Diagnosis not present

## 2015-02-11 DIAGNOSIS — T83498A Other mechanical complication of other prosthetic devices, implants and grafts of genital tract, initial encounter: Secondary | ICD-10-CM | POA: Diagnosis not present

## 2015-02-11 DIAGNOSIS — E441 Mild protein-calorie malnutrition: Secondary | ICD-10-CM | POA: Diagnosis not present

## 2015-02-11 DIAGNOSIS — K219 Gastro-esophageal reflux disease without esophagitis: Secondary | ICD-10-CM | POA: Insufficient documentation

## 2015-02-11 DIAGNOSIS — R6251 Failure to thrive (child): Secondary | ICD-10-CM | POA: Diagnosis not present

## 2015-02-11 DIAGNOSIS — T859XXA Unspecified complication of internal prosthetic device, implant and graft, initial encounter: Secondary | ICD-10-CM | POA: Diagnosis not present

## 2015-02-11 DIAGNOSIS — Z978 Presence of other specified devices: Secondary | ICD-10-CM | POA: Diagnosis not present

## 2015-02-11 DIAGNOSIS — I129 Hypertensive chronic kidney disease with stage 1 through stage 4 chronic kidney disease, or unspecified chronic kidney disease: Secondary | ICD-10-CM | POA: Diagnosis not present

## 2015-02-11 DIAGNOSIS — Z87891 Personal history of nicotine dependence: Secondary | ICD-10-CM | POA: Diagnosis not present

## 2015-02-11 DIAGNOSIS — Z79899 Other long term (current) drug therapy: Secondary | ICD-10-CM | POA: Diagnosis not present

## 2015-02-11 DIAGNOSIS — R5381 Other malaise: Secondary | ICD-10-CM | POA: Diagnosis not present

## 2015-02-11 DIAGNOSIS — I714 Abdominal aortic aneurysm, without rupture: Secondary | ICD-10-CM | POA: Diagnosis not present

## 2015-02-11 DIAGNOSIS — R627 Adult failure to thrive: Secondary | ICD-10-CM | POA: Diagnosis not present

## 2015-02-11 DIAGNOSIS — Z7901 Long term (current) use of anticoagulants: Secondary | ICD-10-CM | POA: Diagnosis not present

## 2015-02-11 DIAGNOSIS — E784 Other hyperlipidemia: Secondary | ICD-10-CM | POA: Diagnosis not present

## 2015-02-11 DIAGNOSIS — R109 Unspecified abdominal pain: Secondary | ICD-10-CM | POA: Diagnosis not present

## 2015-02-11 DIAGNOSIS — K220 Achalasia of cardia: Secondary | ICD-10-CM | POA: Diagnosis not present

## 2015-02-11 DIAGNOSIS — M170 Bilateral primary osteoarthritis of knee: Secondary | ICD-10-CM | POA: Diagnosis not present

## 2015-02-11 DIAGNOSIS — G8929 Other chronic pain: Secondary | ICD-10-CM | POA: Diagnosis not present

## 2015-02-11 DIAGNOSIS — Z8673 Personal history of transient ischemic attack (TIA), and cerebral infarction without residual deficits: Secondary | ICD-10-CM | POA: Insufficient documentation

## 2015-02-11 DIAGNOSIS — I4891 Unspecified atrial fibrillation: Secondary | ICD-10-CM | POA: Diagnosis not present

## 2015-02-11 DIAGNOSIS — Z431 Encounter for attention to gastrostomy: Secondary | ICD-10-CM | POA: Diagnosis not present

## 2015-02-11 DIAGNOSIS — E876 Hypokalemia: Secondary | ICD-10-CM | POA: Diagnosis present

## 2015-02-11 DIAGNOSIS — M47896 Other spondylosis, lumbar region: Secondary | ICD-10-CM | POA: Diagnosis not present

## 2015-02-11 DIAGNOSIS — Z4682 Encounter for fitting and adjustment of non-vascular catheter: Secondary | ICD-10-CM | POA: Diagnosis not present

## 2015-02-11 DIAGNOSIS — Z66 Do not resuscitate: Secondary | ICD-10-CM | POA: Diagnosis present

## 2015-02-11 DIAGNOSIS — R634 Abnormal weight loss: Secondary | ICD-10-CM | POA: Diagnosis present

## 2015-02-11 DIAGNOSIS — D649 Anemia, unspecified: Secondary | ICD-10-CM | POA: Diagnosis not present

## 2015-02-11 DIAGNOSIS — E1165 Type 2 diabetes mellitus with hyperglycemia: Secondary | ICD-10-CM | POA: Diagnosis not present

## 2015-02-11 DIAGNOSIS — N183 Chronic kidney disease, stage 3 (moderate): Secondary | ICD-10-CM | POA: Diagnosis not present

## 2015-02-11 DIAGNOSIS — T85598A Other mechanical complication of other gastrointestinal prosthetic devices, implants and grafts, initial encounter: Secondary | ICD-10-CM | POA: Insufficient documentation

## 2015-02-11 DIAGNOSIS — T83198A Other mechanical complication of other urinary devices and implants, initial encounter: Secondary | ICD-10-CM | POA: Diagnosis not present

## 2015-02-11 DIAGNOSIS — Z87438 Personal history of other diseases of male genital organs: Secondary | ICD-10-CM | POA: Diagnosis not present

## 2015-02-11 DIAGNOSIS — D62 Acute posthemorrhagic anemia: Secondary | ICD-10-CM | POA: Diagnosis not present

## 2015-02-11 DIAGNOSIS — R000 Tachycardia, unspecified: Secondary | ICD-10-CM | POA: Diagnosis present

## 2015-02-11 DIAGNOSIS — E119 Type 2 diabetes mellitus without complications: Secondary | ICD-10-CM | POA: Insufficient documentation

## 2015-02-11 DIAGNOSIS — Z7982 Long term (current) use of aspirin: Secondary | ICD-10-CM | POA: Diagnosis not present

## 2015-02-11 DIAGNOSIS — Z960 Presence of urogenital implants: Secondary | ICD-10-CM | POA: Diagnosis not present

## 2015-02-11 DIAGNOSIS — I739 Peripheral vascular disease, unspecified: Secondary | ICD-10-CM | POA: Insufficient documentation

## 2015-02-11 DIAGNOSIS — K254 Chronic or unspecified gastric ulcer with hemorrhage: Secondary | ICD-10-CM | POA: Diagnosis not present

## 2015-02-11 DIAGNOSIS — R278 Other lack of coordination: Secondary | ICD-10-CM | POA: Diagnosis not present

## 2015-02-11 DIAGNOSIS — K861 Other chronic pancreatitis: Secondary | ICD-10-CM | POA: Diagnosis not present

## 2015-02-11 DIAGNOSIS — R338 Other retention of urine: Secondary | ICD-10-CM | POA: Diagnosis present

## 2015-02-11 DIAGNOSIS — Z5181 Encounter for therapeutic drug level monitoring: Secondary | ICD-10-CM | POA: Diagnosis not present

## 2015-02-11 DIAGNOSIS — I959 Hypotension, unspecified: Secondary | ICD-10-CM | POA: Diagnosis not present

## 2015-02-11 DIAGNOSIS — R2689 Other abnormalities of gait and mobility: Secondary | ICD-10-CM | POA: Diagnosis not present

## 2015-02-11 DIAGNOSIS — N184 Chronic kidney disease, stage 4 (severe): Secondary | ICD-10-CM | POA: Diagnosis not present

## 2015-02-11 DIAGNOSIS — Z4659 Encounter for fitting and adjustment of other gastrointestinal appliance and device: Secondary | ICD-10-CM

## 2015-02-11 DIAGNOSIS — T849XXA Unspecified complication of internal orthopedic prosthetic device, implant and graft, initial encounter: Secondary | ICD-10-CM | POA: Diagnosis not present

## 2015-02-11 DIAGNOSIS — I2510 Atherosclerotic heart disease of native coronary artery without angina pectoris: Secondary | ICD-10-CM | POA: Diagnosis not present

## 2015-02-11 DIAGNOSIS — H1131 Conjunctival hemorrhage, right eye: Secondary | ICD-10-CM | POA: Diagnosis present

## 2015-02-11 DIAGNOSIS — M6281 Muscle weakness (generalized): Secondary | ICD-10-CM | POA: Diagnosis not present

## 2015-02-11 DIAGNOSIS — M158 Other polyosteoarthritis: Secondary | ICD-10-CM | POA: Diagnosis not present

## 2015-02-11 DIAGNOSIS — I639 Cerebral infarction, unspecified: Secondary | ICD-10-CM | POA: Diagnosis not present

## 2015-02-11 DIAGNOSIS — R569 Unspecified convulsions: Secondary | ICD-10-CM | POA: Diagnosis not present

## 2015-02-11 DIAGNOSIS — K922 Gastrointestinal hemorrhage, unspecified: Secondary | ICD-10-CM | POA: Diagnosis not present

## 2015-02-11 DIAGNOSIS — K31811 Angiodysplasia of stomach and duodenum with bleeding: Secondary | ICD-10-CM | POA: Diagnosis not present

## 2015-02-11 DIAGNOSIS — N529 Male erectile dysfunction, unspecified: Secondary | ICD-10-CM | POA: Diagnosis not present

## 2015-02-11 DIAGNOSIS — E1122 Type 2 diabetes mellitus with diabetic chronic kidney disease: Secondary | ICD-10-CM | POA: Diagnosis not present

## 2015-02-11 DIAGNOSIS — F329 Major depressive disorder, single episode, unspecified: Secondary | ICD-10-CM | POA: Diagnosis not present

## 2015-02-11 DIAGNOSIS — R1314 Dysphagia, pharyngoesophageal phase: Secondary | ICD-10-CM | POA: Diagnosis not present

## 2015-02-11 DIAGNOSIS — E870 Hyperosmolality and hypernatremia: Secondary | ICD-10-CM | POA: Diagnosis not present

## 2015-02-11 DIAGNOSIS — I4892 Unspecified atrial flutter: Secondary | ICD-10-CM | POA: Diagnosis not present

## 2015-02-11 DIAGNOSIS — A4902 Methicillin resistant Staphylococcus aureus infection, unspecified site: Secondary | ICD-10-CM | POA: Diagnosis present

## 2015-02-11 DIAGNOSIS — N359 Urethral stricture, unspecified: Secondary | ICD-10-CM | POA: Diagnosis not present

## 2015-02-11 DIAGNOSIS — T82519A Breakdown (mechanical) of unspecified cardiac and vascular devices and implants, initial encounter: Secondary | ICD-10-CM | POA: Diagnosis not present

## 2015-02-11 DIAGNOSIS — N401 Enlarged prostate with lower urinary tract symptoms: Secondary | ICD-10-CM | POA: Diagnosis not present

## 2015-02-11 DIAGNOSIS — Z8679 Personal history of other diseases of the circulatory system: Secondary | ICD-10-CM | POA: Diagnosis not present

## 2015-02-11 DIAGNOSIS — N189 Chronic kidney disease, unspecified: Secondary | ICD-10-CM | POA: Diagnosis not present

## 2015-02-11 LAB — GLUCOSE, CAPILLARY
GLUCOSE-CAPILLARY: 142 mg/dL — AB (ref 65–99)
GLUCOSE-CAPILLARY: 195 mg/dL — AB (ref 65–99)
Glucose-Capillary: 170 mg/dL — ABNORMAL HIGH (ref 65–99)

## 2015-02-11 LAB — PROTIME-INR
INR: 2.3 — ABNORMAL HIGH (ref 0.00–1.49)
Prothrombin Time: 25.1 seconds — ABNORMAL HIGH (ref 11.6–15.2)

## 2015-02-11 MED ORDER — PANCRELIPASE (LIP-PROT-AMYL) 12000-38000 UNITS PO CPEP: 12000.0000 [IU] | ORAL_CAPSULE | Freq: Three times a day (TID) | ORAL | Status: DC

## 2015-02-11 MED ORDER — JEVITY 1.2 CAL PO LIQD: 1000.0000 mL | ORAL | Status: DC

## 2015-02-11 MED ORDER — LIDOCAINE HCL 2 % EX GEL
1.0000 "application " | Freq: Once | CUTANEOUS | Status: AC
  Administered 2015-02-11: 1 via TOPICAL
  Filled 2015-02-11: qty 11

## 2015-02-11 MED ORDER — WARFARIN SODIUM 2.5 MG PO TABS: 1.2500 mg | ORAL_TABLET | Freq: Every day | ORAL | Status: DC

## 2015-02-11 MED ORDER — MIRTAZAPINE 15 MG PO TABS: 15.0000 mg | ORAL_TABLET | Freq: Every day | ORAL | Status: DC

## 2015-02-11 NOTE — Progress Notes (Signed)
IV dc'd, Dobbhoff feeding tube flushed and capped, Foley remaining in place. Transferred to [facility] via [facility].

## 2015-02-11 NOTE — ED Notes (Signed)
Bed: WA09 Expected date:  Expected time:  Means of arrival:  Comments: EMS 

## 2015-02-11 NOTE — NC FL2 (Signed)
[place] MEDICAID FL2 LEVEL OF CARE SCREENING TOOL     IDENTIFICATION  Patient Name: Brian Burns Birthdate: December 06, 1945 Sex: male Admission Date (Current Location): 01/24/2015  [facility] and [place] Number:  Producer, television/film/video and Address:  [facility], [address], [place], [place] [address]      Provider Number: [id number]  Attending Physician Name and Address:  Moses Manners, MD  Relative Name and Phone Number:       Current Level of Care: Hospital Recommended Level of Care: Skilled Nursing Facility Prior Approval Number:    Date Approved/Denied:   PASRR Number: [id number]  Discharge Plan: SNF    Current Diagnoses: Patient Active Problem List   Diagnosis Date Noted  . Achalasia   . Anemia due to bone marrow failure (HCC)   . Hyperchloremia   . Dysphagia   . Feeling weak   . Malnutrition of moderate degree 01/25/2015  . Weight loss, unintentional   . Dehydration   . Bladder outlet obstruction 01/20/2015  . Balance problem   . Idiopathic acute pancreatitis   . UTI (lower urinary tract infection)   . Staphylococcal infection 01/17/2015  . Protein-calorie malnutrition, severe 01/16/2015  . Atrial flutter with rapid ventricular response (HCC) 01/16/2015  . Abdominal pain   . Intractable hiccups   . Renal failure (ARF), acute on chronic (HCC) 01/15/2015  . AKI (acute kidney injury) (HCC)   . Hyperkalemia   . Hypernatremia   . Catheter-associated urinary tract infection (HCC)   . Renal failure 01/14/2015  . Actinic keratosis 10/07/2014  . Bilateral wrist pain 09/09/2014  . Food impaction of esophagus 06/01/2014  . Decreased visual acuity 04/08/2014  . Acute cystitis with positive culture 03/30/2014  . Dysuria 03/18/2014  . Polyuria 03/18/2014  . Loss of weight 03/18/2014  . Callus of foot 12/02/2013  . GERD (gastroesophageal reflux disease) 10/21/2013  . Chest congestion 10/21/2013  . Right lower lobe pneumonia  10/11/2013  . Constipation 10/11/2013  . Type 2 diabetes mellitus (HCC) 09/14/2013  . AAA (abdominal aortic aneurysm) without rupture (HCC) 06/24/2013  . PVD (peripheral vascular disease) (HCC) 06/24/2013  . Throat discomfort 04/09/2013  . Vertigo 03/18/2013  . Hearing loss of both ears 03/18/2013  . BPH (benign prostatic hyperplasia) 02/16/2013  . Urethral stricture 02/16/2013  . Healthcare maintenance 02/16/2013  . Other malaise and fatigue 07/17/2012  . Pain in joint, pelvic region and thigh 06/18/2012  . Back pain 06/18/2012  . Peripheral vascular disease, unspecified (HCC) 06/18/2012  . Hematuria 06/11/2012  . Neuropathic pain of both legs 05/10/2012  . CVA (cerebral infarction) 01/10/2012  . TIA (transient ischemic attack) 01/08/2012  . Knee pain, bilateral 04/09/2011  . Urinary frequency 04/09/2011  . Carotid artery calcification 07/20/2009  . PVD WITH CLAUDICATION 07/20/2009  . BACK PAIN, LUMBAR, CHRONIC 11/27/2007  . Hyperlipidemia 02/05/2007  . Overweight(278.02) 06/04/2006  . HYPERTENSION, BENIGN SYSTEMIC 03/27/2006  . Coronary atherosclerosis 03/27/2006  . IMPOTENCE, ORGANIC 03/27/2006    Orientation RESPIRATION BLADDER Height & Weight    Self, Time, Situation, Place  Normal Indwelling catheter 6\' 4"  (193 cm) 173 lbs.  BEHAVIORAL SYMPTOMS/MOOD NEUROLOGICAL BOWEL NUTRITION STATUS      Continent Diet  Jevity  NGT feedings 45cc/hr with goal of 65 cc/hr  AMBULATORY STATUS COMMUNICATION OF NEEDS Skin   Extensive Assist Verbally  Personal Care Assistance Level of Assistance  Bathing, Dressing Bathing Assistance: Maximum assistance   Dressing Assistance: Maximum assistance     Functional Limitations Info             SPECIAL CARE FACTORS FREQUENCY  PT (By licensed PT)     PT Frequency: 5/wk              Contractures      Additional Factors Info  Code Status, Allergies, Isolation Precautions Code Status Info:  DNR Allergies Info: Novocain     Isolation Precautions Info: MRSA     Current Medications (02/11/2015):  This is the current hospital active medication list Current Facility-Administered Medications  Medication Dose Route Frequency Provider Last Rate Last Dose  . 0.9 %  sodium chloride infusion   Intravenous Continuous Carman Ching, MD 20 mL/hr at 02/11/15 0227    . acetaminophen (TYLENOL) tablet 650 mg  650 mg Oral Q6H PRN Almon Hercules, MD   650 mg at 02/08/15 1750  . amLODipine (NORVASC) tablet 10 mg  10 mg Oral Daily Nani Ravens, MD   10 mg at 02/10/15 0941  . aspirin EC tablet 81 mg  81 mg Oral Daily Nani Ravens, MD   81 mg at 02/10/15 0932  . atorvastatin (LIPITOR) tablet 40 mg  40 mg Oral q1800 Nani Ravens, MD   40 mg at 02/10/15 1740  . citalopram (CELEXA) tablet 20 mg  20 mg Oral Daily Tobey Grim, MD   20 mg at 02/10/15 0941  . docusate sodium (COLACE) capsule 100 mg  100 mg Oral BID Myra Rude, MD   100 mg at 02/10/15 0941  . feeding supplement (ENSURE ENLIVE) (ENSURE ENLIVE) liquid 237 mL  237 mL Oral TID BM Ailene Ards, RD   237 mL at 01/30/15 1118  . feeding supplement (JEVITY 1.2 CAL) liquid 1,000 mL  1,000 mL Per Tube Continuous Campbell Stall, MD 45 mL/hr at 02/11/15 0226 1,000 mL at 02/11/15 0226  . haloperidol lactate (HALDOL) injection 1 mg  1 mg Intravenous Once Beaulah Dinning, MD   1 mg at 02/09/15 1902  . hydrALAZINE (APRESOLINE) injection 10 mg  10 mg Intravenous Q8H PRN Almon Hercules, MD      . insulin aspart (novoLOG) injection 0-9 Units  0-9 Units Subcutaneous TID WC Almon Hercules, MD   2 Units at 02/11/15 0836  . lipase/protease/amylase (CREON) capsule 12,000 Units  12,000 Units Oral TID WC Myra Rude, MD   12,000 Units at 02/11/15 0820  . magnesium hydroxide (MILK OF MAGNESIA) suspension 15 mL  15 mL Oral Once Almon Hercules, MD   15 mL at 02/09/15 1030  . mirtazapine (REMERON) tablet 15 mg  15 mg Oral QHS Ardith Dark, MD    15 mg at 02/10/15 2259  . pantoprazole (PROTONIX) EC tablet 40 mg  40 mg Oral Daily Nani Ravens, MD   40 mg at 02/10/15 0942  . promethazine (PHENERGAN) injection 12.5 mg  12.5 mg Intravenous Q6H PRN Nani Ravens, MD   12.5 mg at 02/10/15 1937  . promethazine (PHENERGAN) injection 6.25-12.5 mg  6.25-12.5 mg Intravenous Q15 min PRN Eilene Ghazi, MD      . sodium chloride 0.9 % injection 3 mL  3 mL Intravenous Q12H Nani Ravens, MD   3 mL at 02/10/15 2300  . Warfarin - Pharmacist Dosing Inpatient   Does not apply q1800 Veatrice Bourbon  Bell, [facility]   Stopped at 02/08/15 1800     Discharge Medications: Please see discharge summary for a list of discharge medications.  Relevant Imaging Results:  Relevant Lab Results:   Additional Information SS#: [id number]  Darylene Price, LCSW

## 2015-02-11 NOTE — Progress Notes (Addendum)
Pt transferred to: [facility] Anticipated date of transfer: 02/11/15 Transported by: ambulance ([facility]) Time Tentatively Scheduled for:  11:30 am Family notified: Daughter- [name]- she will notify patient's wife Report #: [phone]  Given to unit RN to call report. Patient is agreeable to d/c. CSW signing off.  Lovette Cliche, LCSW [phone]

## 2015-02-11 NOTE — ED Provider Notes (Signed)
CSN: [id number]     Arrival date & time 02/11/15  1926 History   First MD Initiated Contact with Patient 02/11/15 1939     Chief Complaint  Patient presents with  . For NGT Insertion      (Consider location/radiation/quality/duration/timing/severity/associated sxs/prior Treatment) HPI Comments: Patient presents to the emergency department with chief complaints of dislodged feeding tube. He states that his feeding tube accidentally came out today while he was at the skilled nursing facility. He has a feeding tube secondary to achalasia. He has been evaluated by general surgery during a recent hospital admission, but at this time it is felt that nasal feeding tube is seen best treatment plan for the patient. Patient denies any pain. There are no aggravating or alleviating factors. Nothing makes his symptoms better or worse.  The history is provided by the patient. No language interpreter was used.    Past Medical History  Diagnosis Date  . Hypertension   . HLD (hyperlipidemia)   . Erectile dysfunction   . Chronic back pain   . CAD (coronary artery disease)   . GERD (gastroesophageal reflux disease)   . Arthritis     knees,lower back  . Carotid artery disease (HCC)   . PVD (peripheral vascular disease) with claudication (HCC)   . AAA (abdominal aortic aneurysm) without rupture (HCC) 06/2014    no change in last exam from 2015  . History of CVA (cerebrovascular accident)     2009  &  2013  . Type 2 diabetes mellitus (HCC)     no meds per pt  . Atrial flutter with rapid ventricular response (HCC) 01/16/2015  . Atrial fibrillation (HCC)   . Idiopathic pancreatitis 12/2014    acute   Past Surgical History  Procedure Laterality Date  . Esophagogastroduodenoscopy N/A 05/15/2014    Procedure: ESOPHAGOGASTRODUODENOSCOPY (EGD);  Surgeon: Carman Ching, MD;  Location: [facility] ENDOSCOPY;  Service: Endoscopy;  Laterality: N/A;  . Cataract extraction w/ intraocular lens  implant, bilateral  april  and may 2014  . Transthoracic echocardiogram  01-07-2012    mild to moderate dilated LV,  ef 45-50%,  mild basal hypokinesis,  grade I diastolic  dysfunction/  trivial AR/  mild MR  . Cardiovascular stress test  08-09-2009    mild global hypokinesis,  no ischemia or scar/  ef 41%  . Cystoscopy with urethral dilatation N/A 11/24/2014    Procedure: CYSTOSCOPY WITH URETHRAL DILATATION;  Surgeon: Crist Fat, MD;  Location: [facility];  Service: Urology;  Laterality: N/A;  BALLOON DILATION        . Cystoscopy with retrograde urethrogram N/A 11/24/2014    Procedure: CYSTOSCOPY WITH RETROGRADE URETHROGRAM;  Surgeon: Crist Fat, MD;  Location: [facility];  Service: Urology;  Laterality: N/A;  . Esophagogastroduodenoscopy Left 02/02/2015    Procedure: ESOPHAGOGASTRODUODENOSCOPY (EGD);  Surgeon: Dorena Cookey, MD;  Location: [facility] ENDOSCOPY;  Service: Endoscopy;  Laterality: Left;  . Esophageal manometry N/A 02/08/2015    Procedure: ESOPHAGEAL MANOMETRY (EM);  Surgeon: Carman Ching, MD;  Location: [facility] ENDOSCOPY;  Service: Endoscopy;  Laterality: N/A;   Family History  Problem Relation Age of Onset  . Lung disease Brother   . Kidney disease Brother   . Heart disease Brother     died at 43  . Heart disease Mother   . Kidney disease Mother   . Lung disease Mother    Social History  Substance Use Topics  . Smoking status: Former Smoker -- 0.50  packs/day for 40 years    Types: Cigarettes    Quit date: 01/29/2004  . Smokeless tobacco: Never Used  . Alcohol Use: No    Review of Systems  Constitutional: Negative for fever and chills.  Respiratory: Negative for shortness of breath.   Cardiovascular: Negative for chest pain.  Gastrointestinal: Negative for nausea, vomiting, diarrhea and constipation.  Genitourinary: Negative for dysuria.  All other systems reviewed and are negative.     Allergies  Novocain  Home Medications   Prior to  Admission medications   Medication Sig Start Date End Date Taking? Authorizing Provider  amLODipine (NORVASC) 10 MG tablet Take 1 tablet (10 mg total) by mouth daily. 02/24/14   Stephanie Coup Street, MD  aspirin EC 81 MG tablet Take 81 mg by mouth daily.    Historical Provider, MD  atorvastatin (LIPITOR) 40 MG tablet Take 1 tablet (40 mg total) by mouth daily. 09/27/14   Erasmo Downer, MD  finasteride (PROSCAR) 5 MG tablet Take 1 tablet (5 mg total) by mouth daily. Patient not taking: Reported on 01/22/2015 04/26/13   Stephanie Coup Street, MD  gabapentin (NEURONTIN) 100 MG capsule Take 1 capsule (100 mg total) by mouth 3 (three) times daily. Patient not taking: Reported on 01/14/2015 07/23/14   Stephanie Coup Street, MD  lipase/protease/amylase (CREON) 12000 units CPEP capsule Take 1 capsule (12,000 Units total) by mouth 3 (three) times daily with meals. 02/11/15   Almon Hercules, MD  mirtazapine (REMERON) 15 MG tablet Take 1 tablet (15 mg total) by mouth at bedtime. 02/11/15   Almon Hercules, MD  Nutritional Supplements (FEEDING SUPPLEMENT, JEVITY 1.2 CAL,) LIQD Place 1,000 mLs into feeding tube continuous. 02/11/15   Almon Hercules, MD  omeprazole (PRILOSEC) 20 MG capsule Take 1 capsule (20 mg total) by mouth daily. Patient not taking: Reported on 01/24/2015 01/22/15   Loren Racer, MD  ondansetron (ZOFRAN) 4 MG tablet Take 1 tablet (4 mg total) by mouth every 6 (six) hours as needed for nausea or vomiting. Patient not taking: Reported on 01/24/2015 01/22/15   Loren Racer, MD  phenazopyridine (PYRIDIUM) 200 MG tablet Take 1 tablet (200 mg total) by mouth 3 (three) times daily as needed for pain. Patient not taking: Reported on 01/14/2015 11/24/14   Crist Fat, MD  sucralfate (CARAFATE) 1 G tablet Take 1 tablet (1 g total) by mouth 3 (three) times daily as needed. Patient not taking: Reported on 01/24/2015 01/22/15   Loren Racer, MD  tamsulosin (FLOMAX) 0.4 MG CAPS capsule Take 1  capsule (0.4 mg total) by mouth daily. Patient not taking: Reported on 01/14/2015 04/07/14   Stephanie Coup Street, MD  warfarin (COUMADIN) 2.5 MG tablet Take 0.5 tablets (1.25 mg total) by mouth daily. Starting 12/24, then as directed from INR check on 12/27 02/11/15   Almon Hercules, MD   BP 127/98 mmHg  Pulse 70  Temp(Src) 98.2 F (36.8 C) (Oral)  Resp 19  SpO2 98% Physical Exam  Constitutional: He is oriented to person, place, and time. He appears well-developed and well-nourished.  HENT:  Head: Normocephalic and atraumatic.  Eyes: Conjunctivae and EOM are normal. Pupils are equal, round, and reactive to light. Right eye exhibits no discharge. Left eye exhibits no discharge. No scleral icterus.  Neck: Normal range of motion. Neck supple. No JVD present.  Cardiovascular: Normal rate, regular rhythm and normal heart sounds.  Exam reveals no gallop and no friction rub.   No murmur heard. Pulmonary/Chest: Effort  normal and breath sounds normal. No respiratory distress. He has no wheezes. He has no rales. He exhibits no tenderness.  Abdominal: Soft. He exhibits no distension and no mass. There is no tenderness. There is no rebound and no guarding.  Musculoskeletal: Normal range of motion. He exhibits no edema or tenderness.  Neurological: He is alert and oriented to person, place, and time.  Skin: Skin is warm and dry.  Psychiatric: He has a normal mood and affect. His behavior is normal. Judgment and thought content normal.  Nursing note and vitals reviewed.   ED Course  Procedures (including critical care time)  Imaging Review Dg Abd 1 View  02/11/2015  CLINICAL DATA:  Nasogastric tube placement EXAM: ABDOMEN - 1 VIEW COMPARISON:  CT abdomen and pelvis January 24, 2015 FINDINGS: Nasogastric tube tip and side port are in the stomach. There is contrast throughout the colon. There is no bowel dilatation or air-fluid level suggesting obstruction. No free air is apparent on this supine  examination. Lung bases are clear. There are foci of arterial vascular calcification in the pelvis. IMPRESSION: Nasogastric tube tip and side port in stomach. Bowel gas pattern unremarkable. Lung bases clear. Electronically Signed   By: Bretta Bang III M.D.   On: 02/11/2015 21:24   I have personally reviewed and evaluated these images and lab results as part of my medical decision-making.    MDM   Final diagnoses:  Encounter for feeding tube placement   Patient with dislodged feeding tube. Discussed with Dr. Fayrene Fearing, who recommends consultation with gastroenterology.  Patient discussed with Dr. [name] of [facility] gastroenterology, recommends placing NG tube. States that the issue is getting the tube to the stomach. States that if we can confirm that the tube is in the stomach, that is sufficient for the patient's needs. Will attempt placement and check position on KUB.  NG tube in stomach.  DC to SNF.  Roxy Horseman, PA-C 02/11/15 2210  Rolland Porter, MD 02/15/15 [phone]

## 2015-02-11 NOTE — Clinical Social Work Placement (Signed)
   CLINICAL SOCIAL WORK PLACEMENT  NOTE  Date:  02/11/2015  Patient Details  Name: Brian Burns MRN: [id number] Date of Birth: 1945-03-19  Clinical Social Work is seeking post-discharge placement for this patient at the Skilled  Nursing Facility level of care (*CSW will initial, date and re-position this form in  chart as items are completed):  Yes   Patient/family provided with [place] Clinical Social Work Department's list of facilities offering this level of care within the geographic area requested by the patient (or if unable, by the patient's family).  Yes   Patient/family informed of their freedom to choose among providers that offer the needed level of care, that participate in Medicare, Medicaid or managed care program needed by the patient, have an available bed and are willing to accept the patient.  Yes   Patient/family informed of [facility] ownership interest in [facility] and [facility], as well as of the fact that they are under no obligation to receive care at these facilities.  PASRR submitted to EDS on 01/25/15     PASRR number received on 01/25/15     Existing PASRR number confirmed on       FL2 transmitted to all facilities in geographic area requested by pt/family on 01/25/15     FL2 transmitted to all facilities within larger geographic area on       Patient informed that his/her managed care company has contracts with or will negotiate with certain facilities, including the following:   ([employer]- [employer].  Prior auth required for SNF placement)     Yes   Patient/family informed of bed offers received.  Patient chooses bed at [facility]     Physician recommends and patient chooses bed at      Patient to be transferred to [facility] on 02/11/15.  Patient to be transferred to facility by [facility])     Patient family notified on 02/04/15 of transfer.  Name of family member notified:   Daughter [name]     PHYSICIAN Please sign FL2, Please sign DNR, Please prepare priority discharge summary, including medications     Additional Comment:    _______________________________________________ Darylene Price, LCSW 02/11/2015, 10:45 AM

## 2015-02-11 NOTE — ED Notes (Signed)
[facility] here to transport pt back to [facility] facility.

## 2015-02-11 NOTE — ED Notes (Signed)
Brought in by EMS from [facility] facility with c/o NGT removal.  Per EMS, pt's NGT was accidentally pulled out by pt.  Was sent here for NGT re-insertion.  Pt has no other complaints.

## 2015-02-11 NOTE — ED Notes (Signed)
[facility] facility was called and notified of pt's discharge; was also advised on pt's NGT placement.

## 2015-02-11 NOTE — Discharge Instructions (Signed)
Care of a Feeding Tube People who have trouble swallowing or cannot take food or medicine by mouth are sometimes given feeding tubes. A feeding tube can go into the nose and down to the stomach or through the skin in the abdomen and into the stomach or small bowel. Some of the names of these feeding tubes are gastrostomy tubes, PEG lines, nasogastric tubes, and gastrojejunostomy tubes.  SUPPLIES NEEDED TO CARE FOR THE TUBE SITE  Clean gloves.  Clean wash cloth, gauze pads, or soft paper towel.  Cotton swabs.  Skin barrier ointment or cream.  Soap and water.  Pre-cut foam pads or gauze (that go around the tube).  Tube tape. TUBE SITE CARE 1. Have all supplies ready and available. 2. Wash hands well. 3. Put on clean gloves. 4. Remove the soiled foam pad or gauze, if present, that is found under the tube stabilizer. Change the foam pad or gauze daily or when soiled or moist. 5. Check the skin around the tube site for redness, rash, swelling, drainage, or extra tissue growth. If you notice any of these, call your caregiver. 6. Moisten gauze and cotton swabs with water and soap. 7. Wipe the area closest to the tube (right near the stoma) with cotton swabs. Wipe the surrounding skin with moistened gauze. Rinse with water. 8. Dry the skin and stoma site with a dry gauze pad or soft paper towel. Do not use antibiotic ointments at the tube site. 9. If the skin is red, apply a skin barrier cream or ointment (such as petroleum jelly) in a circular motion, using a cotton swab. The cream or ointment will provide a moisture barrier for the skin and helps with wound healing. 10. Apply a new pre-cut foam pad or gauze around the tube. Secure it with tape around the edges. If no drainage is present, foam pads or gauze may be left off. 11. Use tape or an anchoring device to fasten the feeding tube to the skin for comfort or as directed. Rotate where you tape the tube to avoid skin damage from the  adhesive. 12. Position the person in a semi-upright position (30-45 degree angle). 13. Throw away used supplies. 14. Remove gloves. 15. Wash hands. SUPPLIES NEEDED TO FLUSH A FEEDING TUBE  Clean gloves.  60 mL syringe (that connects to the feeding tube).  Towel.  Water. FLUSHING A FEEDING TUBE  1. Have all supplies ready and available. 2. Wash hands well. 3. Put on clean gloves. 4. Draw up 30 mL of water in the syringe. 5. Kink the feeding tube while disconnecting it from the feeding-bag tubing or while removing the plug at the end of the tube. Kinking closes the tube and prevents secretions in the tube from spilling out. 6. Insert the tip of the syringe into the end of the feeding tube. Release the kink. Slowly inject the water. 7. If unable to inject the water, the person with the feeding tube should lay on his or her left side. The tip of the tube may be against the stomach wall, blocking fluid flow. Changing positions may move the tip away from the stomach wall. After repositioning, try injecting the water again. 8. After injecting the water, remove the syringe. 9. Always flush before giving the first medicine, between medicines, and after the final medicine before starting a feeding. This prevents medicines from clogging the tube. 10. Throw away used supplies. 11. Remove gloves. 12. Wash hands.   This information is not intended to replace   advice given to you by your health care provider. Make sure you discuss any questions you have with your health care provider.   Document Released: 01/14/2005 Document Revised: 01/01/2012 Document Reviewed: 08/29/2011 Elsevier Interactive Patient Education 2016 Elsevier Inc.   

## 2015-02-13 ENCOUNTER — Emergency Department (HOSPITAL_COMMUNITY): Payer: Commercial Managed Care - HMO

## 2015-02-13 ENCOUNTER — Encounter (HOSPITAL_COMMUNITY): Payer: Self-pay | Admitting: Gastroenterology

## 2015-02-13 ENCOUNTER — Emergency Department (HOSPITAL_COMMUNITY)
Admission: EM | Admit: 2015-02-13 | Discharge: 2015-02-13 | Disposition: A | Discharge status: Home / Self Care | Payer: Commercial Managed Care - HMO | Attending: Emergency Medicine | Admitting: Emergency Medicine

## 2015-02-13 DIAGNOSIS — Z4659 Encounter for fitting and adjustment of other gastrointestinal appliance and device: Secondary | ICD-10-CM

## 2015-02-13 DIAGNOSIS — E119 Type 2 diabetes mellitus without complications: Secondary | ICD-10-CM | POA: Insufficient documentation

## 2015-02-13 DIAGNOSIS — I4891 Unspecified atrial fibrillation: Secondary | ICD-10-CM | POA: Insufficient documentation

## 2015-02-13 DIAGNOSIS — I2510 Atherosclerotic heart disease of native coronary artery without angina pectoris: Secondary | ICD-10-CM | POA: Diagnosis not present

## 2015-02-13 DIAGNOSIS — Z431 Encounter for attention to gastrostomy: Secondary | ICD-10-CM | POA: Diagnosis not present

## 2015-02-13 DIAGNOSIS — N529 Male erectile dysfunction, unspecified: Secondary | ICD-10-CM | POA: Insufficient documentation

## 2015-02-13 DIAGNOSIS — Z8673 Personal history of transient ischemic attack (TIA), and cerebral infarction without residual deficits: Secondary | ICD-10-CM | POA: Insufficient documentation

## 2015-02-13 DIAGNOSIS — M158 Other polyosteoarthritis: Secondary | ICD-10-CM | POA: Diagnosis not present

## 2015-02-13 DIAGNOSIS — Z4682 Encounter for fitting and adjustment of non-vascular catheter: Secondary | ICD-10-CM | POA: Diagnosis not present

## 2015-02-13 DIAGNOSIS — I10 Essential (primary) hypertension: Secondary | ICD-10-CM | POA: Insufficient documentation

## 2015-02-13 DIAGNOSIS — K219 Gastro-esophageal reflux disease without esophagitis: Secondary | ICD-10-CM | POA: Insufficient documentation

## 2015-02-13 DIAGNOSIS — I4892 Unspecified atrial flutter: Secondary | ICD-10-CM | POA: Insufficient documentation

## 2015-02-13 DIAGNOSIS — Z7901 Long term (current) use of anticoagulants: Secondary | ICD-10-CM | POA: Insufficient documentation

## 2015-02-13 DIAGNOSIS — Z7982 Long term (current) use of aspirin: Secondary | ICD-10-CM | POA: Insufficient documentation

## 2015-02-13 DIAGNOSIS — Z79899 Other long term (current) drug therapy: Secondary | ICD-10-CM | POA: Insufficient documentation

## 2015-02-13 DIAGNOSIS — G8929 Other chronic pain: Secondary | ICD-10-CM | POA: Insufficient documentation

## 2015-02-13 DIAGNOSIS — E785 Hyperlipidemia, unspecified: Secondary | ICD-10-CM | POA: Insufficient documentation

## 2015-02-13 DIAGNOSIS — Z87891 Personal history of nicotine dependence: Secondary | ICD-10-CM | POA: Insufficient documentation

## 2015-02-13 MED ORDER — LIDOCAINE HCL 2 % EX GEL
1.0000 "application " | Freq: Once | CUTANEOUS | Status: AC
  Administered 2015-02-13: 1
  Filled 2015-02-13: qty 11

## 2015-02-13 NOTE — ED Notes (Signed)
Transport Called to take back to [facility]

## 2015-02-13 NOTE — Discharge Instructions (Signed)
Feeding Tube Use °Some people who have trouble swallowing or cannot take food or medicine by mouth may need a feeding tube. A feeding tube can go into the nose and down to the stomach or small bowel or through the skin in the abdomen and into the stomach or small bowel. Liquid food (formula), breast milk, or medicines may be given through the tube.  °SUPPLIES NEEDED FOR A TUBE FEEDING  °· Clean gloves. °· Prescribed formula or breast milk. °· Appropriate feeding bag set, gravity drip tubing set, or 30-60-mL syringe with feeding extension tubing. °· Feeding tube pump or syringe pump as needed. °· Pole as needed. °· 20-60-mL syringe to check tube placement. °· A syringe to flush the feeding tube. °· Container. °· Water. °GIVING A FEEDING THROUGH A FEEDING TUBE PUMP °1. Have all supplies ready and available. °2. Wash hands well. °3. Put on clean gloves. °4. Check the placement of the feeding tube as directed. °5. Elevate the head of the person 30-45 degrees or as directed. °6. Pour up to 4 hours of room-temperature feeding into the feeding bag set. °7. Hang the feeding bag set from a pole. °8. Flush the entire feeding bag set with the feeding. °9. Load the feeding bag set into the feeding tube pump. °10. Cap the feeding bag set. °11. Uncap the feeding tube. °12. Using a syringe, flush the feeding tube with water as directed. °13. Uncap the feeding bag set. °14. Connect the feeding bag set to the feeding tube. °15. Remove gloves. °16. Wash hands well. °17. Set the prescribed feeding rate. °18. Start the feeding tube pump. °GIVING A FEEDING THROUGH A SYRINGE PUMP  °1. Have all supplies ready and available. °2. Wash hands well. °3. Put on clean gloves. °4. Check the placement of the feeding tube as directed. °5. Elevate the head of the person 30-45 degrees or as directed. °6. Draw up to 4 hours of room-temperature formula into the correctly sized syringe. °7. Attach the syringe to the feeding extension tubing. °8. Flush  the entire feeding extension tubing with the feeding. °9. Load the syringe and feeding extension tubing into the syringe pump. °10. Cap the feeding extension tubing. °11. Uncap the feeding tube. °12. Using a syringe, flush the feeding tube with water as directed. °13. Uncap the feeding extension tubing. °14. Connect the feeding extension tubing to the feeding tube. °15. Remove gloves. °16. Wash hands well. °17. Set the prescribed feeding rate. °18. Start the syringe pump. °GIVING A FEEDING BY GRAVITY  °1. Have all supplies ready and available. °2. Wash hands well. °3. Put on clean gloves. °4. Check the placement of the feeding tube as directed. °5. Elevate the head of the person 30-45 degrees or as directed. °6. Close the clamp on the gravity drip tubing set. °7. Pour up to 4 hours of room-temperature feeding into the bag of a gravity drip tubing set. Or, if using a ready-to-hang formula container, connect the gravity drip tubing set to the ready-to-hang container. °8. Hang the feeding with the attached gravity drip tubing set from a pole. °9. Open the roller clamp on the gravity drip tubing to fill the entire tubing with the feeding. °10. Close the roller clamp. °11. Cap the gravity drip tubing. °12. Uncap the feeding tube. °13. Using a syringe, flush the feeding tube with water as directed. °14. Uncap the gravity drip tubing. °15. Connect the gravity drip tubing to the feeding tube. °16. Using the roller clamp, adjust the feeding   rate to deliver the feeding at the prescribed rate. °17. Remove gloves. °18. Wash hands well. °GIVING A BOLUS FEEDING THROUGH A FEEDING TUBE °Remove the plunger from the syringe. Attach the syringe to the feeding tube. Pour the feeding into the syringe and administer at the prescribed rate by means of gravity. A feeding bag may also be used for bolus feedings, depending on the volume of feeding given.  °SUPPLIES NEEDED FOR GIVING MEDICINES THROUGH A FEEDING TUBE °· 60-mL  syringe. °· Container. °· Water. °· Medicine. °· Pill crusher if medicine is in tablet form. °· Clean gloves. °GIVING MEDICINES THROUGH A FEEDING TUBE  °1. Have all supplies ready and available. °2. Wash hands well. °3. If the medicine cannot be given with the feeding, stop the feeding 60 minutes before giving the medicine. °4. If the person needs to take medicine on an empty stomach, consider not giving a feeding for up to 2 hours (hold time) before giving medicine. Talk to the caregiver about appropriate hold times. °5. Fill a container with 50-100 mL of warm water. °6. Prepare medicine that will go into the feeding tube. °¨ Liquid--Measure the prescribed medicine dose. °¨ Tablet--Crush the tablet using a pill-crushing device. Grind the pill to a fine powder. Dissolve the powder in at least 30 mL of warm water or as directed. If there is more than 1 tablet, crush and dilute each one individually. °¨ Capsule--Open a capsule containing dry pellets or pierce a capsule containing liquid (gelcap). Empty the contents in 30 mL of warm water or as directed. Gelcaps can also be dissolved in warm water. °7. Elevate the head of the person 30-45 degrees or as directed. °8. Wash hands well. °9. Put on clean gloves. °10. If a continuous tube feeding is infusing, stop the tube feeding. °11. If applicable, close the tubing clamp. °12. Disconnect the feeding bag set or gravity drip tubing set from the feeding tube. °13. Cap the feeding bag set or gravity drip tubing set. °14. Check the placement of the feeding tube as directed. °15. Draw up 30 mL of water into a syringe or as directed. °16. Insert the tip of the syringe into the feeding tube. °17. Using the syringe, flush the feeding tube with water. °18. Remove the plunger of the syringe or replace the syringe with a syringe that contains medicine. °19. Give the medicine as directed. °¨ If giving only 1 dose of medicine, flush the feeding tube with water after giving the  medicine. °¨ If giving more than 1 dose of medicine, give each separately. Flush the feeding tube between medicines and after the last dose of medicine. °20. If a tube feeding will not be continued after the medicine, cap the feeding tube. Position the person to a comfortable position, but keep his or her head elevated for 1 hour after giving the medicine. °21. If a tube feeding will be continued after the medicine, but the medicine cannot be given with the feeding, continue to hold the feeding for 30-60 minutes after the medicine is given. When appropriate, reconnect the feeding bag set or gravity drip tubing set to the feeding tube. °22. Throw away or clean used supplies as directed. °23. Remove gloves. °24. Wash hands well. °  °This information is not intended to replace advice given to you by your health care provider. Make sure you discuss any questions you have with your health care provider. °  °Document Released: 01/01/2012 Document Reviewed: 01/01/2012 °Elsevier Interactive Patient Education ©2016 Elsevier   Inc. ° °

## 2015-02-13 NOTE — ED Notes (Signed)
Bed: WA03 Expected date:  Expected time:  Means of arrival:  Comments: Needs NG tube replaced

## 2015-02-13 NOTE — ED Provider Notes (Signed)
CSN: [id number]     Arrival date & time 02/13/15  1330 History   First MD Initiated Contact with Patient 02/13/15 1507     No chief complaint on file.    (Consider location/radiation/quality/duration/timing/severity/associated sxs/prior Treatment) HPI   Brian Burns is a 70 y.o. male with PMH significant for HTN, HLD, CAD, GERD, PVD, AAA, DM, CVA, AF who presents for feeding tube dislodgement.  He reports that his feeding tube accidentally came out today.  He reports he was suddenly awoken from sleep and his NGT came out.  He had NGT placed secondary to dysphagia secondary to achalasia by Dr. [name] with [facility].  This is his second ER visit for NGT dislodgement with his most recent visit being 2 days ago.  Patient denies any pain and has no complaints at this time.  No modifying or aggravating factors.  No interventions tried PTA.  Denies fever, CP, SOB, abdominal pain, N/V, or urinary symptoms.    Past Medical History  Diagnosis Date  . Hypertension   . HLD (hyperlipidemia)   . Erectile dysfunction   . Chronic back pain   . CAD (coronary artery disease)   . GERD (gastroesophageal reflux disease)   . Arthritis     knees,lower back  . Carotid artery disease (HCC)   . PVD (peripheral vascular disease) with claudication (HCC)   . AAA (abdominal aortic aneurysm) without rupture (HCC) 06/2014    no change in last exam from 2015  . History of CVA (cerebrovascular accident)     2009  &  2013  . Type 2 diabetes mellitus (HCC)     no meds per pt  . Atrial flutter with rapid ventricular response (HCC) 01/16/2015  . Atrial fibrillation (HCC)   . Idiopathic pancreatitis 12/2014    acute   Past Surgical History  Procedure Laterality Date  . Esophagogastroduodenoscopy N/A 05/15/2014    Procedure: ESOPHAGOGASTRODUODENOSCOPY (EGD);  Surgeon: Carman Ching, MD;  Location: [facility] ENDOSCOPY;  Service: Endoscopy;  Laterality: N/A;  . Cataract extraction w/ intraocular lens  implant, bilateral   april and may 2014  . Transthoracic echocardiogram  01-07-2012    mild to moderate dilated LV,  ef 45-50%,  mild basal hypokinesis,  grade I diastolic  dysfunction/  trivial AR/  mild MR  . Cardiovascular stress test  08-09-2009    mild global hypokinesis,  no ischemia or scar/  ef 41%  . Cystoscopy with urethral dilatation N/A 11/24/2014    Procedure: CYSTOSCOPY WITH URETHRAL DILATATION;  Surgeon: Crist Fat, MD;  Location: [facility];  Service: Urology;  Laterality: N/A;  BALLOON DILATION        . Cystoscopy with retrograde urethrogram N/A 11/24/2014    Procedure: CYSTOSCOPY WITH RETROGRADE URETHROGRAM;  Surgeon: Crist Fat, MD;  Location: [facility];  Service: Urology;  Laterality: N/A;  . Esophagogastroduodenoscopy Left 02/02/2015    Procedure: ESOPHAGOGASTRODUODENOSCOPY (EGD);  Surgeon: Dorena Cookey, MD;  Location: [facility] ENDOSCOPY;  Service: Endoscopy;  Laterality: Left;   Family History  Problem Relation Age of Onset  . Lung disease Brother   . Kidney disease Brother   . Heart disease Brother     died at 19  . Heart disease Mother   . Kidney disease Mother   . Lung disease Mother    Social History  Substance Use Topics  . Smoking status: Former Smoker -- 0.50 packs/day for 40 years    Types: Cigarettes    Quit date:  01/29/2004  . Smokeless tobacco: Never Used  . Alcohol Use: No    Review of Systems  All other systems negative unless otherwise stated in HPI   Allergies  Novocain  Home Medications   Prior to Admission medications   Medication Sig Start Date End Date Taking? Authorizing Provider  amLODipine (NORVASC) 10 MG tablet Take 1 tablet (10 mg total) by mouth daily. 02/24/14  Yes Stephanie Coup Street, MD  aspirin EC 81 MG tablet Take 81 mg by mouth daily.   Yes Historical Provider, MD  finasteride (PROSCAR) 5 MG tablet Take 1 tablet (5 mg total) by mouth daily. 04/26/13  Yes Stephanie Coup Street, MD  fluconazole  (DIFLUCAN) 200 MG tablet Take 200 mg by mouth daily. Started 01/15 for 2 days   Yes Historical Provider, MD  gabapentin (NEURONTIN) 100 MG capsule Take 1 capsule (100 mg total) by mouth 3 (three) times daily. 07/23/14  Yes Stephanie Coup Street, MD  lipase/protease/amylase (CREON) 12000 units CPEP capsule Take 1 capsule (12,000 Units total) by mouth 3 (three) times daily with meals. 02/11/15  Yes Almon Hercules, MD  mirtazapine (REMERON) 15 MG tablet Take 1 tablet (15 mg total) by mouth at bedtime. 02/11/15  Yes Almon Hercules, MD  Nutritional Supplements (FEEDING SUPPLEMENT, JEVITY 1.2 CAL,) LIQD Place 1,000 mLs into feeding tube continuous. 02/11/15  Yes Almon Hercules, MD  nystatin (MYCOSTATIN) 100000 UNIT/ML suspension Take 5 mLs by mouth 4 (four) times daily. Started 01/15 until dissolved   Yes Historical Provider, MD  omeprazole (PRILOSEC) 20 MG capsule Take 1 capsule (20 mg total) by mouth daily. 01/22/15  Yes Loren Racer, MD  ondansetron (ZOFRAN) 4 MG tablet Take 1 tablet (4 mg total) by mouth every 6 (six) hours as needed for nausea or vomiting. 01/22/15  Yes Loren Racer, MD  phenazopyridine (PYRIDIUM) 200 MG tablet Take 1 tablet (200 mg total) by mouth 3 (three) times daily as needed for pain. 11/24/14  Yes Crist Fat, MD  sucralfate (CARAFATE) 1 G tablet Take 1 tablet (1 g total) by mouth 3 (three) times daily as needed. Patient taking differently: Take 1 g by mouth 3 (three) times daily as needed (for gas).  01/22/15  Yes Loren Racer, MD  tamsulosin (FLOMAX) 0.4 MG CAPS capsule Take 1 capsule (0.4 mg total) by mouth daily. 04/07/14  Yes Stephanie Coup Street, MD  warfarin (COUMADIN) 2 MG tablet Take 2 mg by mouth daily.   Yes Historical Provider, MD  atorvastatin (LIPITOR) 40 MG tablet Take 1 tablet (40 mg total) by mouth daily. Patient not taking: Reported on 02/13/2015 09/27/14   Erasmo Downer, MD  warfarin (COUMADIN) 2.5 MG tablet Take 0.5 tablets (1.25 mg total) by mouth  daily. Starting 12/24, then as directed from INR check on 12/27 Patient not taking: Reported on 02/13/2015 02/11/15   Almon Hercules, MD   BP 112/93 mmHg  Pulse 101  Temp(Src) 97.3 F (36.3 C) (Oral)  Resp 18  SpO2 99% Physical Exam  Constitutional: He is oriented to person, place, and time. He appears well-developed and well-nourished.  HENT:  Head: Normocephalic and atraumatic.  Mouth/Throat: Oropharynx is clear and moist.  Eyes: Conjunctivae are normal. Pupils are equal, round, and reactive to light.  Neck: Normal range of motion. Neck supple.  Cardiovascular: Normal rate, regular rhythm and normal heart sounds.   No murmur heard. Pulmonary/Chest: Effort normal and breath sounds normal. No accessory muscle usage or stridor. No respiratory distress. He has  no wheezes. He has no rhonchi. He has no rales.  Abdominal: Soft. Bowel sounds are normal. He exhibits no distension. There is no tenderness.  Musculoskeletal: Normal range of motion.  Lymphadenopathy:    He has no cervical adenopathy.  Neurological: He is alert and oriented to person, place, and time.  Speech clear without dysarthria.  Skin: Skin is warm and dry.  Psychiatric: He has a normal mood and affect. His behavior is normal.    ED Course  Procedures (including critical care time) Labs Review Labs Reviewed - No data to display  Imaging Review Dg Chest 1 View  02/13/2015  CLINICAL DATA:  89-year-old male status post nasogastric tube placement EXAM: CHEST 1 VIEW COMPARISON:  Prior CT scan of the chest, abdomen and pelvis 01/24/2015 FINDINGS: The tip of the nasogastric tube overlies the gastric fundus. Contrast material is present within the transverse colon. Patchy airspace opacity present with in the right mid lung. But is more subtle minimal airspace opacity also in the periphery of the left midlung there the lungs are otherwise clear. Cardiac and mediastinal contours are within normal limits. Mild atherosclerotic  calcification present within the transverse aorta. No acute osseous abnormality. IMPRESSION: 1. The tip of the nasogastric tube overlies the gastric fundus. 2. Patchy airspace opacity in the right greater than left mid lungs concerning for possible multifocal pneumonia. 3. Aortic atherosclerosis. Electronically Signed   By: Malachy Moan M.D.   On: 02/13/2015 18:36   Dg Abd 1 View  02/11/2015  CLINICAL DATA:  Nasogastric tube placement EXAM: ABDOMEN - 1 VIEW COMPARISON:  CT abdomen and pelvis January 24, 2015 FINDINGS: Nasogastric tube tip and side port are in the stomach. There is contrast throughout the colon. There is no bowel dilatation or air-fluid level suggesting obstruction. No free air is apparent on this supine examination. Lung bases are clear. There are foci of arterial vascular calcification in the pelvis. IMPRESSION: Nasogastric tube tip and side port in stomach. Bowel gas pattern unremarkable. Lung bases clear. Electronically Signed   By: Bretta Bang III M.D.   On: 02/11/2015 21:24   I have personally reviewed and evaluated these images and lab results as part of my medical decision-making.   EKG Interpretation None      MDM   Final diagnoses:  Encounter for nasogastric (NG) tube placement    Patient presents for NGT placement after it became dislodged earlier today.  He has no other complaints at this time.  After reviewing his last visit, NGT reinserted with confirmation in the stomach with KUB per Dr. [name] recommendation.  Will reinsert NGT and confirm placement with KUB.  If any difficulties arise will consult Dr. [name].  Anticipate d/c home pending NGT placement. Successful placement of NGT with CXR.  Comment regarding patchy airspace opacity of R greater than L mid lungs concerning for possible multifocal PNA.  Patient has NO SOB or cough.  Patient is not hypoxic and 99% on RA.  Doubt PNA, no indication for treatment at this time.  Evaluation does not show  pathology requiring ongoing emergent intervention or admission. Pt is hemodynamically stable and mentating appropriately. Discussed findings/results and plan with patient/guardian, who agrees with plan. All questions answered. Return precautions discussed and outpatient follow up given. Case has been discussed with and seen by Dr. Criss Alvine who agrees with the above plan for discharge.      Cheri Fowler, PA-C 02/13/15 1909  Cheri Fowler, PA-C 02/13/15 1912  Pricilla Loveless, MD 02/15/15 [phone]

## 2015-02-13 NOTE — ED Notes (Signed)
Per EMS, patient is from [place] nursing facility.  Patient's NG tube was pulled out today.  No other complaints.   BP:132/72 HR:80 R:16

## 2015-02-14 ENCOUNTER — Emergency Department (HOSPITAL_COMMUNITY)
Admission: EM | Admit: 2015-02-14 | Discharge: 2015-02-15 | Disposition: A | Discharge status: Home / Self Care | Payer: Commercial Managed Care - HMO | Attending: Emergency Medicine | Admitting: Emergency Medicine

## 2015-02-14 DIAGNOSIS — Z7982 Long term (current) use of aspirin: Secondary | ICD-10-CM | POA: Insufficient documentation

## 2015-02-14 DIAGNOSIS — Z79899 Other long term (current) drug therapy: Secondary | ICD-10-CM | POA: Insufficient documentation

## 2015-02-14 DIAGNOSIS — M170 Bilateral primary osteoarthritis of knee: Secondary | ICD-10-CM | POA: Diagnosis not present

## 2015-02-14 DIAGNOSIS — M47896 Other spondylosis, lumbar region: Secondary | ICD-10-CM | POA: Diagnosis not present

## 2015-02-14 DIAGNOSIS — Z4682 Encounter for fitting and adjustment of non-vascular catheter: Secondary | ICD-10-CM | POA: Diagnosis not present

## 2015-02-14 DIAGNOSIS — K219 Gastro-esophageal reflux disease without esophagitis: Secondary | ICD-10-CM | POA: Diagnosis not present

## 2015-02-14 DIAGNOSIS — I10 Essential (primary) hypertension: Secondary | ICD-10-CM | POA: Diagnosis not present

## 2015-02-14 DIAGNOSIS — Z8673 Personal history of transient ischemic attack (TIA), and cerebral infarction without residual deficits: Secondary | ICD-10-CM | POA: Insufficient documentation

## 2015-02-14 DIAGNOSIS — E119 Type 2 diabetes mellitus without complications: Secondary | ICD-10-CM | POA: Insufficient documentation

## 2015-02-14 DIAGNOSIS — Z431 Encounter for attention to gastrostomy: Secondary | ICD-10-CM | POA: Diagnosis not present

## 2015-02-14 DIAGNOSIS — I4891 Unspecified atrial fibrillation: Secondary | ICD-10-CM | POA: Diagnosis not present

## 2015-02-14 DIAGNOSIS — Z87891 Personal history of nicotine dependence: Secondary | ICD-10-CM | POA: Insufficient documentation

## 2015-02-14 DIAGNOSIS — G8929 Other chronic pain: Secondary | ICD-10-CM | POA: Insufficient documentation

## 2015-02-14 DIAGNOSIS — E785 Hyperlipidemia, unspecified: Secondary | ICD-10-CM | POA: Diagnosis not present

## 2015-02-14 DIAGNOSIS — Z4659 Encounter for fitting and adjustment of other gastrointestinal appliance and device: Secondary | ICD-10-CM

## 2015-02-14 DIAGNOSIS — Z960 Presence of urogenital implants: Secondary | ICD-10-CM | POA: Insufficient documentation

## 2015-02-14 DIAGNOSIS — Z87438 Personal history of other diseases of male genital organs: Secondary | ICD-10-CM | POA: Insufficient documentation

## 2015-02-14 DIAGNOSIS — I2510 Atherosclerotic heart disease of native coronary artery without angina pectoris: Secondary | ICD-10-CM | POA: Insufficient documentation

## 2015-02-14 DIAGNOSIS — N189 Chronic kidney disease, unspecified: Secondary | ICD-10-CM | POA: Diagnosis not present

## 2015-02-14 DIAGNOSIS — E870 Hyperosmolality and hypernatremia: Secondary | ICD-10-CM | POA: Diagnosis not present

## 2015-02-14 DIAGNOSIS — Z7901 Long term (current) use of anticoagulants: Secondary | ICD-10-CM | POA: Insufficient documentation

## 2015-02-14 DIAGNOSIS — R627 Adult failure to thrive: Secondary | ICD-10-CM | POA: Diagnosis not present

## 2015-02-14 NOTE — ED Notes (Signed)
Bed: SH68 Expected date: 02/14/15 Expected time: 11:37 PM Means of arrival: Ambulance Comments: 70 yo M  Pulled out feeding tube

## 2015-02-14 NOTE — ED Notes (Signed)
Pt brought in by EMS from [facility]. Pt pulled out his feeding tube. Facility sent him from replacement. EMS noticed dark urine in foley bag. A&Ox4.  Vitals 122/74, 86 HR. 98%

## 2015-02-15 ENCOUNTER — Emergency Department (HOSPITAL_COMMUNITY): Payer: Commercial Managed Care - HMO

## 2015-02-15 DIAGNOSIS — N189 Chronic kidney disease, unspecified: Secondary | ICD-10-CM | POA: Diagnosis not present

## 2015-02-15 DIAGNOSIS — N359 Urethral stricture, unspecified: Secondary | ICD-10-CM | POA: Diagnosis not present

## 2015-02-15 DIAGNOSIS — E870 Hyperosmolality and hypernatremia: Secondary | ICD-10-CM | POA: Diagnosis not present

## 2015-02-15 DIAGNOSIS — I4891 Unspecified atrial fibrillation: Secondary | ICD-10-CM | POA: Diagnosis not present

## 2015-02-15 MED ORDER — BENZOCAINE 20 % MT SOLN
OROMUCOSAL | Status: AC
  Filled 2015-02-15: qty 57

## 2015-02-15 NOTE — ED Provider Notes (Signed)
CSN: [id number]     Arrival date & time 02/14/15  2356 History   By signing my name below, I, Arlan Organ, attest that this documentation has been prepared under the direction and in the presence of Tilden Fossa, MD.  Electronically Signed: Arlan Organ, ED Scribe. 02/15/2015. 12:33 AM.   Chief Complaint  Patient presents with  . Pulled out feeding tube    The history is provided by the patient. No language interpreter was used.    HPI Comments: CORDARIUS BENNING is a 70 y.o. male with a PMHx of HTN, CAD, AAA, and A-Fib who presents to the Emergency Department here for feeding tube dislodgement this evening. Pt states "the nurses pulled it out but i dont know why". He denies any complaints at this time. No pain currently. Mr. Vacca denies any fever, chills, nausea, vomiting, or diarrhea. Pt was evaluated in the emergency department on 1/14 and 1/16 for same. NG tube initially placed by GI for Achalasia.  PCP: Shirlee Latch, MD    Past Medical History  Diagnosis Date  . Hypertension   . HLD (hyperlipidemia)   . Erectile dysfunction   . Chronic back pain   . CAD (coronary artery disease)   . GERD (gastroesophageal reflux disease)   . Arthritis     knees,lower back  . Carotid artery disease (HCC)   . PVD (peripheral vascular disease) with claudication (HCC)   . AAA (abdominal aortic aneurysm) without rupture (HCC) 06/2014    no change in last exam from 2015  . History of CVA (cerebrovascular accident)     2009  &  2013  . Type 2 diabetes mellitus (HCC)     no meds per pt  . Atrial flutter with rapid ventricular response (HCC) 01/16/2015  . Atrial fibrillation (HCC)   . Idiopathic pancreatitis 12/2014    acute   Past Surgical History  Procedure Laterality Date  . Esophagogastroduodenoscopy N/A 05/15/2014    Procedure: ESOPHAGOGASTRODUODENOSCOPY (EGD);  Surgeon: Carman Ching, MD;  Location: [facility] ENDOSCOPY;  Service: Endoscopy;  Laterality: N/A;  . Cataract extraction w/  intraocular lens  implant, bilateral  april and may 2014  . Transthoracic echocardiogram  01-07-2012    mild to moderate dilated LV,  ef 45-50%,  mild basal hypokinesis,  grade I diastolic  dysfunction/  trivial AR/  mild MR  . Cardiovascular stress test  08-09-2009    mild global hypokinesis,  no ischemia or scar/  ef 41%  . Cystoscopy with urethral dilatation N/A 11/24/2014    Procedure: CYSTOSCOPY WITH URETHRAL DILATATION;  Surgeon: Crist Fat, MD;  Location: [facility];  Service: Urology;  Laterality: N/A;  BALLOON DILATION        . Cystoscopy with retrograde urethrogram N/A 11/24/2014    Procedure: CYSTOSCOPY WITH RETROGRADE URETHROGRAM;  Surgeon: Crist Fat, MD;  Location: [facility];  Service: Urology;  Laterality: N/A;  . Esophagogastroduodenoscopy Left 02/02/2015    Procedure: ESOPHAGOGASTRODUODENOSCOPY (EGD);  Surgeon: Dorena Cookey, MD;  Location: [facility] ENDOSCOPY;  Service: Endoscopy;  Laterality: Left;   Family History  Problem Relation Age of Onset  . Lung disease Brother   . Kidney disease Brother   . Heart disease Brother     died at 47  . Heart disease Mother   . Kidney disease Mother   . Lung disease Mother    Social History  Substance Use Topics  . Smoking status: Former Smoker -- 0.50 packs/day for 40 years  Types: Cigarettes    Quit date: 01/29/2004  . Smokeless tobacco: Never Used  . Alcohol Use: No    Review of Systems  A complete 10 system review of systems was obtained and all systems are negative except as noted in the HPI and PMH.     Allergies  Novocain  Home Medications   Prior to Admission medications   Medication Sig Start Date End Date Taking? Authorizing Provider  amLODipine (NORVASC) 10 MG tablet Take 1 tablet (10 mg total) by mouth daily. 02/24/14  Yes Stephanie Coup Street, MD  aspirin EC 81 MG tablet Take 81 mg by mouth daily.   Yes Historical Provider, MD  finasteride (PROSCAR) 5 MG tablet  Take 1 tablet (5 mg total) by mouth daily. 04/26/13  Yes Stephanie Coup Street, MD  gabapentin (NEURONTIN) 100 MG capsule Take 1 capsule (100 mg total) by mouth 3 (three) times daily. 07/23/14  Yes Stephanie Coup Street, MD  lipase/protease/amylase (CREON) 12000 units CPEP capsule Take 1 capsule (12,000 Units total) by mouth 3 (three) times daily with meals. 02/11/15  Yes Almon Hercules, MD  mirtazapine (REMERON) 15 MG tablet Take 1 tablet (15 mg total) by mouth at bedtime. 02/11/15  Yes Almon Hercules, MD  Nutritional Supplements (FEEDING SUPPLEMENT, JEVITY 1.2 CAL,) LIQD Place 1,000 mLs into feeding tube continuous. 02/11/15  Yes Almon Hercules, MD  nystatin (MYCOSTATIN) 100000 UNIT/ML suspension Take 5 mLs by mouth 4 (four) times daily. Started 01/15 until dissolved   Yes Historical Provider, MD  omeprazole (PRILOSEC) 20 MG capsule Take 1 capsule (20 mg total) by mouth daily. 01/22/15  Yes Loren Racer, MD  ondansetron (ZOFRAN) 4 MG tablet Take 1 tablet (4 mg total) by mouth every 6 (six) hours as needed for nausea or vomiting. 01/22/15  Yes Loren Racer, MD  phenazopyridine (PYRIDIUM) 200 MG tablet Take 1 tablet (200 mg total) by mouth 3 (three) times daily as needed for pain. 11/24/14  Yes Crist Fat, MD  promethazine (PHENERGAN) 25 MG/ML injection Inject 25 mg into the vein once.   Yes Historical Provider, MD  sucralfate (CARAFATE) 1 G tablet Take 1 tablet (1 g total) by mouth 3 (three) times daily as needed. Patient taking differently: Take 1 g by mouth 3 (three) times daily as needed (for gas).  01/22/15  Yes Loren Racer, MD  tamsulosin (FLOMAX) 0.4 MG CAPS capsule Take 1 capsule (0.4 mg total) by mouth daily. 04/07/14  Yes Stephanie Coup Street, MD  warfarin (COUMADIN) 2 MG tablet Take 2 mg by mouth daily.   Yes Historical Provider, MD  atorvastatin (LIPITOR) 40 MG tablet Take 1 tablet (40 mg total) by mouth daily. Patient not taking: Reported on 02/13/2015 09/27/14   Erasmo Downer, MD   warfarin (COUMADIN) 2.5 MG tablet Take 0.5 tablets (1.25 mg total) by mouth daily. Starting 12/24, then as directed from INR check on 12/27 Patient not taking: Reported on 02/15/2015 02/11/15   Almon Hercules, MD   Triage Vitals: BP 131/102 mmHg  Pulse 78  Temp(Src) 98 F (36.7 C) (Oral)  Resp 18  SpO2 100%   Physical Exam  Constitutional: He appears well-developed and well-nourished.  HENT:  Head: Normocephalic and atraumatic.  Cardiovascular: Normal rate and regular rhythm.   No murmur heard. Pulmonary/Chest: Effort normal and breath sounds normal. No respiratory distress.  Abdominal: Soft. There is no tenderness. There is no rebound and no guarding.  Genitourinary:  Foley catheter in place   Musculoskeletal: He  exhibits no edema or tenderness.  Neurological: He is alert.  Skin: Skin is warm and dry.  Psychiatric: He has a normal mood and affect. His behavior is normal.  Nursing note and vitals reviewed.   ED Course  Procedures (including critical care time)  DIAGNOSTIC STUDIES: Oxygen Saturation is 99% on RA, Normal by my interpretation.    COORDINATION OF CARE: 12:26 AM-Will order Gastric tube. Discussed treatment plan with pt at bedside and pt agreed to plan.     Labs Review Labs Reviewed - No data to display  Imaging Review Dg Chest 1 View  02/13/2015  CLINICAL DATA:  37-year-old male status post nasogastric tube placement EXAM: CHEST 1 VIEW COMPARISON:  Prior CT scan of the chest, abdomen and pelvis 01/24/2015 FINDINGS: The tip of the nasogastric tube overlies the gastric fundus. Contrast material is present within the transverse colon. Patchy airspace opacity present with in the right mid lung. But is more subtle minimal airspace opacity also in the periphery of the left midlung there the lungs are otherwise clear. Cardiac and mediastinal contours are within normal limits. Mild atherosclerotic calcification present within the transverse aorta. No acute osseous  abnormality. IMPRESSION: 1. The tip of the nasogastric tube overlies the gastric fundus. 2. Patchy airspace opacity in the right greater than left mid lungs concerning for possible multifocal pneumonia. 3. Aortic atherosclerosis. Electronically Signed   By: Malachy Moan M.D.   On: 02/13/2015 18:36   Dg Abd Portable 1v  02/15/2015  CLINICAL DATA:  70 year old male with feeding tube placement. EXAM: PORTABLE ABDOMEN - 1 VIEW COMPARISON:  Earlier Radiograph dated 02/15/2015 FINDINGS: There has been interval advancement of the enteric tube with tip now in the left upper abdomen likely within the stomach. Moderate stool noted throughout the colon. The visualized lung bases are clear. IMPRESSION: Enteric tube the tip in the left upper abdomen. Electronically Signed   By: Elgie Collard M.D.   On: 02/15/2015 03:04   Dg Abd Portable 1v  02/15/2015  CLINICAL DATA:  Feeding tube placement. EXAM: PORTABLE ABDOMEN - 1 VIEW COMPARISON:  Abdominal radiograph February 11, 2015 FINDINGS: Feeding tube tip projects in distal esophagus. Residual large bowel contrast, bowel gas pattern is nondilated and nonobstructive. IMPRESSION: Feeding tube tip projects in distal esophagus, recommend advancement. Electronically Signed   By: Awilda Metro M.D.   On: 02/15/2015 02:14   I have personally reviewed and evaluated these images and lab results as part of my medical decision-making.   EKG Interpretation None      MDM   Final diagnoses:  Encounter for nasogastric (NG) tube placement   Patient with nasogastric tube for achalasia. Nasogastric tube was accidentally discontinued at his nursing facility. The tube was replaced without difficulty. Repeat evaluation patient without complaints, plan to DC back to facility with gastroenterology follow-up.  I personally performed the services described in this documentation, which was scribed in my presence. The recorded information has been reviewed and is  accurate.   Tilden Fossa, MD 02/15/15 [phone]

## 2015-02-15 NOTE — ED Notes (Signed)
[facility] called  

## 2015-02-15 NOTE — ED Notes (Signed)
MD at bedside. 

## 2015-02-15 NOTE — ED Notes (Signed)
Feeding tube advanced

## 2015-02-15 NOTE — Discharge Instructions (Signed)
Brian Burns had his nasogastric tube replaced in the Emergency Department.  Please contact his Gastroenterologist for follow up.  He did not have any labs performed and will need to follow up with his family doctor for post-hospitalization follow up and repeat labs.  Get him rechecked immediately if he develops fevers, vomiting, or new concerning symptoms.

## 2015-02-16 DIAGNOSIS — I4891 Unspecified atrial fibrillation: Secondary | ICD-10-CM | POA: Diagnosis not present

## 2015-02-16 DIAGNOSIS — N189 Chronic kidney disease, unspecified: Secondary | ICD-10-CM | POA: Diagnosis not present

## 2015-02-16 DIAGNOSIS — E870 Hyperosmolality and hypernatremia: Secondary | ICD-10-CM | POA: Diagnosis not present

## 2015-02-18 DIAGNOSIS — N189 Chronic kidney disease, unspecified: Secondary | ICD-10-CM | POA: Diagnosis not present

## 2015-02-18 DIAGNOSIS — E870 Hyperosmolality and hypernatremia: Secondary | ICD-10-CM | POA: Diagnosis not present

## 2015-02-18 DIAGNOSIS — N359 Urethral stricture, unspecified: Secondary | ICD-10-CM | POA: Diagnosis not present

## 2015-02-18 DIAGNOSIS — I4891 Unspecified atrial fibrillation: Secondary | ICD-10-CM | POA: Diagnosis not present

## 2015-02-21 DIAGNOSIS — E870 Hyperosmolality and hypernatremia: Secondary | ICD-10-CM | POA: Diagnosis not present

## 2015-02-21 DIAGNOSIS — I4891 Unspecified atrial fibrillation: Secondary | ICD-10-CM | POA: Diagnosis not present

## 2015-02-21 DIAGNOSIS — N189 Chronic kidney disease, unspecified: Secondary | ICD-10-CM | POA: Diagnosis not present

## 2015-02-21 DIAGNOSIS — R627 Adult failure to thrive: Secondary | ICD-10-CM | POA: Diagnosis not present

## 2015-02-22 DIAGNOSIS — I10 Essential (primary) hypertension: Secondary | ICD-10-CM | POA: Diagnosis not present

## 2015-02-22 DIAGNOSIS — N184 Chronic kidney disease, stage 4 (severe): Secondary | ICD-10-CM | POA: Diagnosis not present

## 2015-02-22 DIAGNOSIS — K220 Achalasia of cardia: Secondary | ICD-10-CM | POA: Diagnosis not present

## 2015-02-22 DIAGNOSIS — I4891 Unspecified atrial fibrillation: Secondary | ICD-10-CM | POA: Diagnosis not present

## 2015-02-22 DIAGNOSIS — E119 Type 2 diabetes mellitus without complications: Secondary | ICD-10-CM | POA: Diagnosis not present

## 2015-02-22 DIAGNOSIS — E441 Mild protein-calorie malnutrition: Secondary | ICD-10-CM | POA: Diagnosis not present

## 2015-02-22 DIAGNOSIS — E784 Other hyperlipidemia: Secondary | ICD-10-CM | POA: Diagnosis not present

## 2015-02-22 DIAGNOSIS — I714 Abdominal aortic aneurysm, without rupture: Secondary | ICD-10-CM | POA: Diagnosis not present

## 2015-02-22 DIAGNOSIS — I2510 Atherosclerotic heart disease of native coronary artery without angina pectoris: Secondary | ICD-10-CM | POA: Diagnosis not present

## 2015-02-26 ENCOUNTER — Emergency Department (HOSPITAL_COMMUNITY)
Admission: EM | Admit: 2015-02-26 | Discharge: 2015-02-26 | Disposition: A | Discharge status: Home / Self Care | Payer: Commercial Managed Care - HMO | Attending: Emergency Medicine | Admitting: Emergency Medicine

## 2015-02-26 ENCOUNTER — Encounter (HOSPITAL_COMMUNITY): Payer: Self-pay | Admitting: *Deleted

## 2015-02-26 DIAGNOSIS — Z431 Encounter for attention to gastrostomy: Secondary | ICD-10-CM | POA: Diagnosis not present

## 2015-02-26 DIAGNOSIS — Z87891 Personal history of nicotine dependence: Secondary | ICD-10-CM | POA: Insufficient documentation

## 2015-02-26 DIAGNOSIS — G8929 Other chronic pain: Secondary | ICD-10-CM | POA: Insufficient documentation

## 2015-02-26 DIAGNOSIS — K219 Gastro-esophageal reflux disease without esophagitis: Secondary | ICD-10-CM | POA: Insufficient documentation

## 2015-02-26 DIAGNOSIS — I10 Essential (primary) hypertension: Secondary | ICD-10-CM | POA: Diagnosis not present

## 2015-02-26 DIAGNOSIS — Z4659 Encounter for fitting and adjustment of other gastrointestinal appliance and device: Secondary | ICD-10-CM

## 2015-02-26 DIAGNOSIS — Z8673 Personal history of transient ischemic attack (TIA), and cerebral infarction without residual deficits: Secondary | ICD-10-CM | POA: Insufficient documentation

## 2015-02-26 DIAGNOSIS — Z87438 Personal history of other diseases of male genital organs: Secondary | ICD-10-CM | POA: Insufficient documentation

## 2015-02-26 DIAGNOSIS — E119 Type 2 diabetes mellitus without complications: Secondary | ICD-10-CM | POA: Diagnosis not present

## 2015-02-26 DIAGNOSIS — I2510 Atherosclerotic heart disease of native coronary artery without angina pectoris: Secondary | ICD-10-CM | POA: Diagnosis not present

## 2015-02-26 DIAGNOSIS — Z7901 Long term (current) use of anticoagulants: Secondary | ICD-10-CM | POA: Insufficient documentation

## 2015-02-26 DIAGNOSIS — M1990 Unspecified osteoarthritis, unspecified site: Secondary | ICD-10-CM | POA: Insufficient documentation

## 2015-02-26 DIAGNOSIS — Z79899 Other long term (current) drug therapy: Secondary | ICD-10-CM | POA: Insufficient documentation

## 2015-02-26 DIAGNOSIS — Z7982 Long term (current) use of aspirin: Secondary | ICD-10-CM | POA: Diagnosis not present

## 2015-02-26 DIAGNOSIS — E785 Hyperlipidemia, unspecified: Secondary | ICD-10-CM | POA: Diagnosis not present

## 2015-02-26 DIAGNOSIS — Z978 Presence of other specified devices: Secondary | ICD-10-CM | POA: Diagnosis not present

## 2015-02-26 NOTE — ED Notes (Signed)
NG tube placed.  Positive audible sounds over gastric area.

## 2015-02-26 NOTE — ED Notes (Signed)
Bed: WA21 Expected date: 02/26/15 Expected time: 10:39 AM Means of arrival: Ambulance Comments: NG tube replacement

## 2015-02-26 NOTE — ED Notes (Signed)
Called for patient transport back to facility. Facility notified

## 2015-02-26 NOTE — ED Notes (Signed)
Patient is alert and oriented x4.  He is being seen for pulling out his nasal feeding tube.   Patient denies any pain.  Patient states that he has the tube because he doesn't eat because All food taste like "crap"

## 2015-02-27 ENCOUNTER — Other Ambulatory Visit: Payer: Self-pay | Admitting: *Deleted

## 2015-02-27 MED ORDER — ATORVASTATIN CALCIUM 40 MG PO TABS: 40.0000 mg | ORAL_TABLET | Freq: Every day | ORAL | Status: DC

## 2015-03-01 DIAGNOSIS — N189 Chronic kidney disease, unspecified: Secondary | ICD-10-CM | POA: Diagnosis not present

## 2015-03-01 DIAGNOSIS — I4891 Unspecified atrial fibrillation: Secondary | ICD-10-CM | POA: Diagnosis not present

## 2015-03-01 DIAGNOSIS — E870 Hyperosmolality and hypernatremia: Secondary | ICD-10-CM | POA: Diagnosis not present

## 2015-03-01 DIAGNOSIS — R627 Adult failure to thrive: Secondary | ICD-10-CM | POA: Diagnosis not present

## 2015-03-03 DIAGNOSIS — E870 Hyperosmolality and hypernatremia: Secondary | ICD-10-CM | POA: Diagnosis not present

## 2015-03-03 DIAGNOSIS — N189 Chronic kidney disease, unspecified: Secondary | ICD-10-CM | POA: Diagnosis not present

## 2015-03-03 DIAGNOSIS — I4891 Unspecified atrial fibrillation: Secondary | ICD-10-CM | POA: Diagnosis not present

## 2015-03-04 NOTE — ED Provider Notes (Signed)
CSN: [id number]     Arrival date & time 02/26/15  1040 History   First MD Initiated Contact with Patient 02/26/15 1059     Chief Complaint  Patient presents with  . feeding tube replacement      (Consider location/radiation/quality/duration/timing/severity/associated sxs/prior Treatment) HPI Patient sent from nursing home facility for replacement of an NG feeding tube. Patient has no complaints. He denies any pain. Past Medical History  Diagnosis Date  . Hypertension   . HLD (hyperlipidemia)   . Erectile dysfunction   . Chronic back pain   . CAD (coronary artery disease)   . GERD (gastroesophageal reflux disease)   . Arthritis     knees,lower back  . Carotid artery disease (HCC)   . PVD (peripheral vascular disease) with claudication (HCC)   . AAA (abdominal aortic aneurysm) without rupture (HCC) 06/2014    no change in last exam from 2015  . History of CVA (cerebrovascular accident)     2009  &  2013  . Type 2 diabetes mellitus (HCC)     no meds per pt  . Atrial flutter with rapid ventricular response (HCC) 01/16/2015  . Atrial fibrillation (HCC)   . Idiopathic pancreatitis 12/2014    acute   Past Surgical History  Procedure Laterality Date  . Esophagogastroduodenoscopy N/A 05/15/2014    Procedure: ESOPHAGOGASTRODUODENOSCOPY (EGD);  Surgeon: Carman Ching, MD;  Location: [facility] ENDOSCOPY;  Service: Endoscopy;  Laterality: N/A;  . Cataract extraction w/ intraocular lens  implant, bilateral  april and may 2014  . Transthoracic echocardiogram  01-07-2012    mild to moderate dilated LV,  ef 45-50%,  mild basal hypokinesis,  grade I diastolic  dysfunction/  trivial AR/  mild MR  . Cardiovascular stress test  08-09-2009    mild global hypokinesis,  no ischemia or scar/  ef 41%  . Cystoscopy with urethral dilatation N/A 11/24/2014    Procedure: CYSTOSCOPY WITH URETHRAL DILATATION;  Surgeon: Crist Fat, MD;  Location: [facility];  Service: Urology;  Laterality:  N/A;  BALLOON DILATION        . Cystoscopy with retrograde urethrogram N/A 11/24/2014    Procedure: CYSTOSCOPY WITH RETROGRADE URETHROGRAM;  Surgeon: Crist Fat, MD;  Location: [facility];  Service: Urology;  Laterality: N/A;  . Esophagogastroduodenoscopy Left 02/02/2015    Procedure: ESOPHAGOGASTRODUODENOSCOPY (EGD);  Surgeon: Dorena Cookey, MD;  Location: [facility] ENDOSCOPY;  Service: Endoscopy;  Laterality: Left;   Family History  Problem Relation Age of Onset  . Lung disease Brother   . Kidney disease Brother   . Heart disease Brother     died at 32  . Heart disease Mother   . Kidney disease Mother   . Lung disease Mother    Social History  Substance Use Topics  . Smoking status: Former Smoker -- 0.50 packs/day for 40 years    Types: Cigarettes    Quit date: 01/29/2004  . Smokeless tobacco: Never Used  . Alcohol Use: No    Review of Systems  Constitutional: No fevers no chills GI: No vomiting or diarrhea. Respiratory: No chest pain or shortness of breath   Allergies  Novocain  Home Medications   Prior to Admission medications   Medication Sig Start Date End Date Taking? Authorizing Provider  amLODipine (NORVASC) 10 MG tablet Take 1 tablet (10 mg total) by mouth daily. 02/24/14  Yes Stephanie Coup Street, MD  aspirin EC 81 MG tablet Take 81 mg by mouth daily.  Yes Historical Provider, MD  citalopram (CELEXA) 20 MG tablet Take 20 mg by mouth daily at 6 PM.   Yes Historical Provider, MD  finasteride (PROSCAR) 5 MG tablet Take 1 tablet (5 mg total) by mouth daily. 04/26/13  Yes Stephanie Coup Street, MD  gabapentin (NEURONTIN) 100 MG capsule Take 1 capsule (100 mg total) by mouth 3 (three) times daily. 07/23/14  Yes Stephanie Coup Street, MD  lipase/protease/amylase (CREON) 12000 units CPEP capsule Take 1 capsule (12,000 Units total) by mouth 3 (three) times daily with meals. 02/11/15  Yes Almon Hercules, MD  Nutritional Supplements (FEEDING SUPPLEMENT, JEVITY  1.2 CAL,) LIQD Place 1,000 mLs into feeding tube continuous. Patient taking differently: Place 1,000 mLs into feeding tube continuous. 100 ml /hr 02/11/15  Yes Almon Hercules, MD  nystatin (MYCOSTATIN) 100000 UNIT/ML suspension Take 5 mLs by mouth 4 (four) times daily. Started 01/15 until resolved   Yes Historical Provider, MD  omeprazole (PRILOSEC) 20 MG capsule Take 1 capsule (20 mg total) by mouth daily. 01/22/15  Yes Loren Racer, MD  phenazopyridine (PYRIDIUM) 200 MG tablet Take 1 tablet (200 mg total) by mouth 3 (three) times daily as needed for pain. 11/24/14  Yes Crist Fat, MD  sucralfate (CARAFATE) 1 G tablet Take 1 tablet (1 g total) by mouth 3 (three) times daily as needed. Patient taking differently: Take 1 g by mouth 3 (three) times daily as needed (for gas).  01/22/15  Yes Loren Racer, MD  tamsulosin (FLOMAX) 0.4 MG CAPS capsule Take 1 capsule (0.4 mg total) by mouth daily. 04/07/14  Yes Stephanie Coup Street, MD  warfarin (COUMADIN) 5 MG tablet Take 5 mg by mouth daily. LAST INR CHECK 02/24/15  NEXT DUE ON 02/27/15 02/24/15  Yes Historical Provider, MD  atorvastatin (LIPITOR) 40 MG tablet Take 1 tablet (40 mg total) by mouth daily. 02/27/15   Erasmo Downer, MD  mirtazapine (REMERON) 15 MG tablet Take 1 tablet (15 mg total) by mouth at bedtime. Patient not taking: Reported on 02/26/2015 02/11/15   Almon Hercules, MD  ondansetron (ZOFRAN) 4 MG tablet Take 1 tablet (4 mg total) by mouth every 6 (six) hours as needed for nausea or vomiting. Patient not taking: Reported on 02/26/2015 01/22/15   Loren Racer, MD  warfarin (COUMADIN) 2.5 MG tablet Take 0.5 tablets (1.25 mg total) by mouth daily. Starting 12/24, then as directed from INR check on 12/27 Patient not taking: Reported on 02/15/2015 02/11/15   Almon Hercules, MD   BP 123/87 mmHg  Pulse 66  Temp(Src) 98.3 F (36.8 C) (Oral)  Resp 20  SpO2 98% Physical Exam  Constitutional:  Patient is thin. He is alert and nontoxic.  No respiratory distress.  HENT:  Head: Normocephalic and atraumatic.  Cardiovascular: Normal rate and normal heart sounds.   Pulmonary/Chest: Effort normal and breath sounds normal.  Abdominal: Soft. He exhibits no distension. There is no tenderness.  Neurological: He is alert.  Skin: Skin is warm and dry.  Psychiatric: He has a normal mood and affect.    ED Course  Procedures (including critical care time) Labs Review Labs Reviewed - No data to display  Imaging Review No results found. I have personally reviewed and evaluated these images and lab results as part of my medical decision-making.   EKG Interpretation None      MDM   Final diagnoses:  Encounter for nasogastric (NG) tube placement   Patient has problems recurrently pulling out his NG tube.  He is sent for replacement. Nursing staff has replaced this without difficulty. She shouldn't is safe to return to nursing home facility for ongoing care.    Arby Barrette, MD 03/04/15 [phone]

## 2015-03-06 DIAGNOSIS — N189 Chronic kidney disease, unspecified: Secondary | ICD-10-CM | POA: Diagnosis not present

## 2015-03-06 DIAGNOSIS — I639 Cerebral infarction, unspecified: Secondary | ICD-10-CM | POA: Diagnosis not present

## 2015-03-06 DIAGNOSIS — N359 Urethral stricture, unspecified: Secondary | ICD-10-CM | POA: Diagnosis not present

## 2015-03-06 DIAGNOSIS — I4891 Unspecified atrial fibrillation: Secondary | ICD-10-CM | POA: Diagnosis not present

## 2015-03-16 ENCOUNTER — Emergency Department (HOSPITAL_COMMUNITY): Payer: Commercial Managed Care - HMO

## 2015-03-16 ENCOUNTER — Encounter (HOSPITAL_COMMUNITY)
Admission: EM | Disposition: A | Discharge status: Skilled Nursing Facility | Payer: Self-pay | Source: Home / Self Care | Attending: Family Medicine

## 2015-03-16 ENCOUNTER — Inpatient Hospital Stay (HOSPITAL_COMMUNITY)
Admission: EM | Admit: 2015-03-16 | Discharge: 2015-03-22 | DRG: 327 | Disposition: A | Discharge status: Skilled Nursing Facility | Payer: Commercial Managed Care - HMO | Attending: Family Medicine | Admitting: Family Medicine

## 2015-03-16 ENCOUNTER — Inpatient Hospital Stay (HOSPITAL_COMMUNITY): Payer: Commercial Managed Care - HMO | Admitting: Anesthesiology

## 2015-03-16 ENCOUNTER — Encounter (HOSPITAL_COMMUNITY): Payer: Self-pay

## 2015-03-16 DIAGNOSIS — K922 Gastrointestinal hemorrhage, unspecified: Secondary | ICD-10-CM | POA: Diagnosis not present

## 2015-03-16 DIAGNOSIS — E785 Hyperlipidemia, unspecified: Secondary | ICD-10-CM | POA: Diagnosis present

## 2015-03-16 DIAGNOSIS — K861 Other chronic pancreatitis: Secondary | ICD-10-CM | POA: Diagnosis not present

## 2015-03-16 DIAGNOSIS — R6251 Failure to thrive (child): Secondary | ICD-10-CM | POA: Diagnosis not present

## 2015-03-16 DIAGNOSIS — R634 Abnormal weight loss: Secondary | ICD-10-CM | POA: Diagnosis present

## 2015-03-16 DIAGNOSIS — R278 Other lack of coordination: Secondary | ICD-10-CM | POA: Diagnosis not present

## 2015-03-16 DIAGNOSIS — D649 Anemia, unspecified: Secondary | ICD-10-CM | POA: Insufficient documentation

## 2015-03-16 DIAGNOSIS — K219 Gastro-esophageal reflux disease without esophagitis: Secondary | ICD-10-CM | POA: Diagnosis present

## 2015-03-16 DIAGNOSIS — K220 Achalasia of cardia: Secondary | ICD-10-CM | POA: Diagnosis not present

## 2015-03-16 DIAGNOSIS — I480 Paroxysmal atrial fibrillation: Secondary | ICD-10-CM | POA: Insufficient documentation

## 2015-03-16 DIAGNOSIS — I4891 Unspecified atrial fibrillation: Secondary | ICD-10-CM | POA: Diagnosis not present

## 2015-03-16 DIAGNOSIS — K254 Chronic or unspecified gastric ulcer with hemorrhage: Secondary | ICD-10-CM | POA: Diagnosis not present

## 2015-03-16 DIAGNOSIS — Z87891 Personal history of nicotine dependence: Secondary | ICD-10-CM

## 2015-03-16 DIAGNOSIS — R1314 Dysphagia, pharyngoesophageal phase: Secondary | ICD-10-CM | POA: Diagnosis not present

## 2015-03-16 DIAGNOSIS — F32A Depression, unspecified: Secondary | ICD-10-CM

## 2015-03-16 DIAGNOSIS — N401 Enlarged prostate with lower urinary tract symptoms: Secondary | ICD-10-CM | POA: Diagnosis present

## 2015-03-16 DIAGNOSIS — N182 Chronic kidney disease, stage 2 (mild): Secondary | ICD-10-CM

## 2015-03-16 DIAGNOSIS — Z8679 Personal history of other diseases of the circulatory system: Secondary | ICD-10-CM | POA: Diagnosis not present

## 2015-03-16 DIAGNOSIS — R000 Tachycardia, unspecified: Secondary | ICD-10-CM | POA: Diagnosis present

## 2015-03-16 DIAGNOSIS — I10 Essential (primary) hypertension: Secondary | ICD-10-CM | POA: Insufficient documentation

## 2015-03-16 DIAGNOSIS — I6930 Unspecified sequelae of cerebral infarction: Secondary | ICD-10-CM | POA: Diagnosis not present

## 2015-03-16 DIAGNOSIS — R569 Unspecified convulsions: Secondary | ICD-10-CM | POA: Diagnosis not present

## 2015-03-16 DIAGNOSIS — Z79899 Other long term (current) drug therapy: Secondary | ICD-10-CM | POA: Diagnosis not present

## 2015-03-16 DIAGNOSIS — K920 Hematemesis: Secondary | ICD-10-CM | POA: Diagnosis present

## 2015-03-16 DIAGNOSIS — I2510 Atherosclerotic heart disease of native coronary artery without angina pectoris: Secondary | ICD-10-CM | POA: Diagnosis present

## 2015-03-16 DIAGNOSIS — I129 Hypertensive chronic kidney disease with stage 1 through stage 4 chronic kidney disease, or unspecified chronic kidney disease: Secondary | ICD-10-CM | POA: Diagnosis present

## 2015-03-16 DIAGNOSIS — Z7901 Long term (current) use of anticoagulants: Secondary | ICD-10-CM | POA: Diagnosis not present

## 2015-03-16 DIAGNOSIS — N99113 Postprocedural anterior urethral stricture: Secondary | ICD-10-CM | POA: Diagnosis not present

## 2015-03-16 DIAGNOSIS — K2901 Acute gastritis with bleeding: Secondary | ICD-10-CM

## 2015-03-16 DIAGNOSIS — Z7982 Long term (current) use of aspirin: Secondary | ICD-10-CM

## 2015-03-16 DIAGNOSIS — N400 Enlarged prostate without lower urinary tract symptoms: Secondary | ICD-10-CM | POA: Diagnosis not present

## 2015-03-16 DIAGNOSIS — R2689 Other abnormalities of gait and mobility: Secondary | ICD-10-CM | POA: Diagnosis not present

## 2015-03-16 DIAGNOSIS — N183 Chronic kidney disease, stage 3 (moderate): Secondary | ICD-10-CM | POA: Diagnosis not present

## 2015-03-16 DIAGNOSIS — D62 Acute posthemorrhagic anemia: Secondary | ICD-10-CM | POA: Insufficient documentation

## 2015-03-16 DIAGNOSIS — Z466 Encounter for fitting and adjustment of urinary device: Secondary | ICD-10-CM | POA: Diagnosis not present

## 2015-03-16 DIAGNOSIS — I6381 Other cerebral infarction due to occlusion or stenosis of small artery: Secondary | ICD-10-CM | POA: Insufficient documentation

## 2015-03-16 DIAGNOSIS — E7800 Pure hypercholesterolemia, unspecified: Secondary | ICD-10-CM | POA: Diagnosis present

## 2015-03-16 DIAGNOSIS — Z8673 Personal history of transient ischemic attack (TIA), and cerebral infarction without residual deficits: Secondary | ICD-10-CM | POA: Diagnosis not present

## 2015-03-16 DIAGNOSIS — N359 Urethral stricture, unspecified: Secondary | ICD-10-CM | POA: Diagnosis not present

## 2015-03-16 DIAGNOSIS — E1165 Type 2 diabetes mellitus with hyperglycemia: Secondary | ICD-10-CM | POA: Diagnosis present

## 2015-03-16 DIAGNOSIS — R338 Other retention of urine: Secondary | ICD-10-CM | POA: Diagnosis present

## 2015-03-16 DIAGNOSIS — I959 Hypotension, unspecified: Secondary | ICD-10-CM | POA: Diagnosis not present

## 2015-03-16 DIAGNOSIS — E1122 Type 2 diabetes mellitus with diabetic chronic kidney disease: Secondary | ICD-10-CM | POA: Diagnosis not present

## 2015-03-16 DIAGNOSIS — E876 Hypokalemia: Secondary | ICD-10-CM

## 2015-03-16 DIAGNOSIS — K31811 Angiodysplasia of stomach and duodenum with bleeding: Secondary | ICD-10-CM | POA: Diagnosis present

## 2015-03-16 DIAGNOSIS — Z66 Do not resuscitate: Secondary | ICD-10-CM | POA: Diagnosis present

## 2015-03-16 DIAGNOSIS — H1131 Conjunctival hemorrhage, right eye: Secondary | ICD-10-CM | POA: Diagnosis present

## 2015-03-16 DIAGNOSIS — N1831 Chronic kidney disease, stage 3a: Secondary | ICD-10-CM

## 2015-03-16 DIAGNOSIS — R5381 Other malaise: Secondary | ICD-10-CM | POA: Diagnosis not present

## 2015-03-16 DIAGNOSIS — F329 Major depressive disorder, single episode, unspecified: Secondary | ICD-10-CM | POA: Diagnosis not present

## 2015-03-16 DIAGNOSIS — E1169 Type 2 diabetes mellitus with other specified complication: Secondary | ICD-10-CM | POA: Diagnosis present

## 2015-03-16 DIAGNOSIS — N35919 Unspecified urethral stricture, male, unspecified site: Secondary | ICD-10-CM | POA: Insufficient documentation

## 2015-03-16 DIAGNOSIS — M6281 Muscle weakness (generalized): Secondary | ICD-10-CM | POA: Diagnosis not present

## 2015-03-16 DIAGNOSIS — N189 Chronic kidney disease, unspecified: Secondary | ICD-10-CM

## 2015-03-16 DIAGNOSIS — I714 Abdominal aortic aneurysm, without rupture: Secondary | ICD-10-CM | POA: Diagnosis not present

## 2015-03-16 DIAGNOSIS — I739 Peripheral vascular disease, unspecified: Secondary | ICD-10-CM | POA: Diagnosis present

## 2015-03-16 DIAGNOSIS — D696 Thrombocytopenia, unspecified: Secondary | ICD-10-CM | POA: Insufficient documentation

## 2015-03-16 DIAGNOSIS — A4902 Methicillin resistant Staphylococcus aureus infection, unspecified site: Secondary | ICD-10-CM | POA: Diagnosis present

## 2015-03-16 DIAGNOSIS — K253 Acute gastric ulcer without hemorrhage or perforation: Secondary | ICD-10-CM | POA: Diagnosis not present

## 2015-03-16 DIAGNOSIS — Z5181 Encounter for therapeutic drug level monitoring: Secondary | ICD-10-CM | POA: Diagnosis not present

## 2015-03-16 DIAGNOSIS — E119 Type 2 diabetes mellitus without complications: Secondary | ICD-10-CM

## 2015-03-16 DIAGNOSIS — R739 Hyperglycemia, unspecified: Secondary | ICD-10-CM

## 2015-03-16 HISTORY — PX: ESOPHAGOGASTRODUODENOSCOPY: SHX5428

## 2015-03-16 LAB — I-STAT CHEM 8, ED
BUN: 8 mg/dL (ref 6–20)
CREATININE: 1.1 mg/dL (ref 0.61–1.24)
Calcium, Ion: 1.09 mmol/L — ABNORMAL LOW (ref 1.13–1.30)
Chloride: 102 mmol/L (ref 101–111)
Glucose, Bld: 171 mg/dL — ABNORMAL HIGH (ref 65–99)
HEMATOCRIT: 24 % — AB (ref 39.0–52.0)
HEMOGLOBIN: 8.2 g/dL — AB (ref 13.0–17.0)
POTASSIUM: 3.2 mmol/L — AB (ref 3.5–5.1)
SODIUM: 140 mmol/L (ref 135–145)
TCO2: 25 mmol/L (ref 0–100)

## 2015-03-16 LAB — CBC WITH DIFFERENTIAL/PLATELET
BASOS ABS: 0 10*3/uL (ref 0.0–0.1)
BASOS PCT: 0 %
EOS ABS: 0.1 10*3/uL (ref 0.0–0.7)
EOS PCT: 1 %
HCT: 22.7 % — ABNORMAL LOW (ref 39.0–52.0)
Hemoglobin: 7.4 g/dL — ABNORMAL LOW (ref 13.0–17.0)
LYMPHS ABS: 2.5 10*3/uL (ref 0.7–4.0)
LYMPHS PCT: 25 %
MCH: 28.8 pg (ref 26.0–34.0)
MCHC: 32.6 g/dL (ref 30.0–36.0)
MCV: 88.3 fL (ref 78.0–100.0)
MONO ABS: 0.9 10*3/uL (ref 0.1–1.0)
Monocytes Relative: 9 %
Neutro Abs: 6.3 10*3/uL (ref 1.7–7.7)
Neutrophils Relative %: 65 %
PLATELETS: 155 10*3/uL (ref 150–400)
RBC: 2.57 MIL/uL — AB (ref 4.22–5.81)
RDW: 17.5 % — AB (ref 11.5–15.5)
WBC: 9.7 10*3/uL (ref 4.0–10.5)

## 2015-03-16 LAB — COMPREHENSIVE METABOLIC PANEL
ALK PHOS: 109 U/L (ref 38–126)
ALT: 39 U/L (ref 17–63)
AST: 33 U/L (ref 15–41)
Albumin: 2.7 g/dL — ABNORMAL LOW (ref 3.5–5.0)
Anion gap: 8 (ref 5–15)
BUN: 10 mg/dL (ref 6–20)
CALCIUM: 7.8 mg/dL — AB (ref 8.9–10.3)
CO2: 24 mmol/L (ref 22–32)
CREATININE: 1.29 mg/dL — AB (ref 0.61–1.24)
Chloride: 105 mmol/L (ref 101–111)
GFR calc non Af Amer: 55 mL/min — ABNORMAL LOW (ref >60)
Glucose, Bld: 178 mg/dL — ABNORMAL HIGH (ref 65–99)
Potassium: 3.1 mmol/L — ABNORMAL LOW (ref 3.5–5.1)
SODIUM: 137 mmol/L (ref 135–145)
Total Bilirubin: 0.4 mg/dL (ref 0.3–1.2)
Total Protein: 6 g/dL — ABNORMAL LOW (ref 6.5–8.1)

## 2015-03-16 LAB — CBC
HEMATOCRIT: 24.8 % — AB (ref 39.0–52.0)
Hemoglobin: 8.3 g/dL — ABNORMAL LOW (ref 13.0–17.0)
MCH: 29.5 pg (ref 26.0–34.0)
MCHC: 33.5 g/dL (ref 30.0–36.0)
MCV: 88.3 fL (ref 78.0–100.0)
Platelets: 117 10*3/uL — ABNORMAL LOW (ref 150–400)
RBC: 2.81 MIL/uL — ABNORMAL LOW (ref 4.22–5.81)
RDW: 16.5 % — AB (ref 11.5–15.5)
WBC: 10.8 10*3/uL — AB (ref 4.0–10.5)

## 2015-03-16 LAB — PROTIME-INR
INR: 1.89 — AB (ref 0.00–1.49)
INR: 1.24 (ref 0.00–1.49)
PROTHROMBIN TIME: 15.8 s — AB (ref 11.6–15.2)
Prothrombin Time: 21.6 seconds — ABNORMAL HIGH (ref 11.6–15.2)

## 2015-03-16 LAB — TSH: TSH: 1.148 u[IU]/mL (ref 0.350–4.500)

## 2015-03-16 LAB — LIPASE, BLOOD: Lipase: 112 U/L — ABNORMAL HIGH (ref 11–51)

## 2015-03-16 LAB — ABO/RH: ABO/RH(D): O POS

## 2015-03-16 LAB — PREPARE RBC (CROSSMATCH)

## 2015-03-16 LAB — MAGNESIUM: MAGNESIUM: 1.6 mg/dL — AB (ref 1.7–2.4)

## 2015-03-16 LAB — APTT: aPTT: 51 seconds — ABNORMAL HIGH (ref 24–37)

## 2015-03-16 SURGERY — EGD (ESOPHAGOGASTRODUODENOSCOPY)
Anesthesia: General | Laterality: Left

## 2015-03-16 MED ORDER — SODIUM CHLORIDE 0.9% FLUSH
3.0000 mL | Freq: Two times a day (BID) | INTRAVENOUS | Status: DC
  Administered 2015-03-17 (×2): 3 mL via INTRAVENOUS

## 2015-03-16 MED ORDER — OXYCODONE HCL 5 MG PO TABS: 5.0000 mg | ORAL_TABLET | ORAL | Status: DC | PRN

## 2015-03-16 MED ORDER — SODIUM CHLORIDE 0.9 % IV SOLN
80.0000 mg | Freq: Once | INTRAVENOUS | Status: AC
  Administered 2015-03-16: 80 mg via INTRAVENOUS
  Filled 2015-03-16: qty 80

## 2015-03-16 MED ORDER — SODIUM CHLORIDE 0.9 % IV SOLN
Freq: Once | INTRAVENOUS | Status: AC
  Administered 2015-03-16: 10:00:00 via INTRAVENOUS

## 2015-03-16 MED ORDER — ETOMIDATE 2 MG/ML IV SOLN
INTRAVENOUS | Status: AC
  Filled 2015-03-16: qty 10

## 2015-03-16 MED ORDER — ACETAMINOPHEN 325 MG PO TABS: 650.0000 mg | ORAL_TABLET | Freq: Four times a day (QID) | ORAL | Status: DC | PRN

## 2015-03-16 MED ORDER — SODIUM CHLORIDE 0.9 % IV SOLN
8.0000 mg/h | INTRAVENOUS | Status: DC
  Administered 2015-03-16 – 2015-03-18 (×3): 8 mg/h via INTRAVENOUS
  Filled 2015-03-16 (×10): qty 80

## 2015-03-16 MED ORDER — DEXTROSE 5 % IV SOLN
5.0000 mg | Freq: Once | INTRAVENOUS | Status: DC
  Filled 2015-03-16: qty 0.5

## 2015-03-16 MED ORDER — PANTOPRAZOLE SODIUM 40 MG IV SOLR: 40.0000 mg | Freq: Two times a day (BID) | INTRAVENOUS | Status: DC

## 2015-03-16 MED ORDER — SODIUM CHLORIDE 0.9 % IV SOLN
INTRAVENOUS | Status: DC | PRN
  Administered 2015-03-16: 15:00:00 via INTRAVENOUS

## 2015-03-16 MED ORDER — VITAMIN K1 10 MG/ML IJ SOLN
10.0000 mg | INTRAMUSCULAR | Status: AC
  Administered 2015-03-16: 10 mg via INTRAVENOUS
  Filled 2015-03-16: qty 1

## 2015-03-16 MED ORDER — POTASSIUM CHLORIDE IN NACL 40-0.9 MEQ/L-% IV SOLN
INTRAVENOUS | Status: DC
  Administered 2015-03-16 – 2015-03-19 (×4): 75 mL/h via INTRAVENOUS
  Filled 2015-03-16 (×7): qty 1000

## 2015-03-16 MED ORDER — FENTANYL CITRATE (PF) 100 MCG/2ML IJ SOLN
INTRAMUSCULAR | Status: DC | PRN
  Administered 2015-03-16: 50 ug via INTRAVENOUS

## 2015-03-16 MED ORDER — LIDOCAINE HCL (CARDIAC) 20 MG/ML IV SOLN
INTRAVENOUS | Status: DC | PRN
  Administered 2015-03-16: 100 mg via INTRAVENOUS

## 2015-03-16 MED ORDER — ACETAMINOPHEN 650 MG RE SUPP: 650.0000 mg | Freq: Four times a day (QID) | RECTAL | Status: DC | PRN

## 2015-03-16 MED ORDER — FENTANYL CITRATE (PF) 100 MCG/2ML IJ SOLN
INTRAMUSCULAR | Status: AC
  Filled 2015-03-16: qty 2

## 2015-03-16 MED ORDER — SUCCINYLCHOLINE CHLORIDE 20 MG/ML IJ SOLN
INTRAMUSCULAR | Status: DC | PRN
  Administered 2015-03-16: 100 mg via INTRAVENOUS

## 2015-03-16 MED ORDER — PROTHROMBIN COMPLEX CONC HUMAN 500 UNITS IV KIT
2000.0000 [IU] | PACK | Status: AC
  Administered 2015-03-16: 2000 [IU] via INTRAVENOUS
  Filled 2015-03-16: qty 80

## 2015-03-16 MED ORDER — SODIUM CHLORIDE 0.9 % IV BOLUS (SEPSIS)
500.0000 mL | Freq: Once | INTRAVENOUS | Status: AC
  Administered 2015-03-16: 500 mL via INTRAVENOUS

## 2015-03-16 MED ORDER — PHENYLEPHRINE 40 MCG/ML (10ML) SYRINGE FOR IV PUSH (FOR BLOOD PRESSURE SUPPORT)
PREFILLED_SYRINGE | INTRAVENOUS | Status: AC
  Filled 2015-03-16: qty 10

## 2015-03-16 MED ORDER — ETOMIDATE 2 MG/ML IV SOLN
INTRAVENOUS | Status: DC | PRN
  Administered 2015-03-16: 16 mg via INTRAVENOUS

## 2015-03-16 MED ORDER — SODIUM CHLORIDE 0.9 % IV SOLN
INTRAVENOUS | Status: DC | PRN
  Administered 2015-03-16: 14:00:00 via INTRAVENOUS

## 2015-03-16 MED ORDER — POTASSIUM CHLORIDE 10 MEQ/100ML IV SOLN: 10.0000 meq | INTRAVENOUS | Status: AC

## 2015-03-16 MED ORDER — ONDANSETRON HCL 4 MG PO TABS: 4.0000 mg | ORAL_TABLET | Freq: Four times a day (QID) | ORAL | Status: DC | PRN

## 2015-03-16 MED ORDER — LEVALBUTEROL HCL 0.63 MG/3ML IN NEBU: 0.6300 mg | INHALATION_SOLUTION | Freq: Four times a day (QID) | RESPIRATORY_TRACT | Status: DC | PRN

## 2015-03-16 MED ORDER — PHENYLEPHRINE HCL 10 MG/ML IJ SOLN
INTRAMUSCULAR | Status: DC | PRN
  Administered 2015-03-16 (×2): 80 ug via INTRAVENOUS

## 2015-03-16 MED ORDER — ONDANSETRON HCL 4 MG/2ML IJ SOLN
4.0000 mg | Freq: Four times a day (QID) | INTRAMUSCULAR | Status: DC | PRN
  Administered 2015-03-17: 4 mg via INTRAVENOUS
  Filled 2015-03-16: qty 2

## 2015-03-16 NOTE — Transfer of Care (Signed)
Immediate Anesthesia Transfer of Care Note  Patient: Brian Burns  Procedure(s) Performed: Procedure(s): ESOPHAGOGASTRODUODENOSCOPY (EGD) (Left)  Patient Location: Endoscopy Unit  Anesthesia Type:General  Level of Consciousness: awake  Airway & Oxygen Therapy: Patient Spontanous Breathing and Patient connected to face mask oxygen  Post-op Assessment: Report given to RN and Post -op Vital signs reviewed and stable  Post vital signs: Reviewed and stable  Last Vitals:  Filed Vitals:   03/16/15 1340 03/16/15 1402  BP: 105/69 102/82  Pulse: 70 90  Temp: 36.7 C 36.9 C  Resp: 16 19    Complications: No apparent anesthesia complications

## 2015-03-16 NOTE — ED Notes (Signed)
Pt having vagal episodes in triage with emesis.  Continues to have bloody emesis.

## 2015-03-16 NOTE — ED Notes (Signed)
Bed: WA07 Expected date:  Expected time:  Means of arrival:  Comments: 

## 2015-03-16 NOTE — Anesthesia Preprocedure Evaluation (Addendum)
Anesthesia Evaluation  Patient identified by MRN, date of birth, ID band Patient awake    Reviewed: Allergy & Precautions, NPO status , Patient's Chart, lab work & pertinent test results  Airway Mallampati: II  TM Distance: >3 FB Neck ROM: Full    Dental  (+) Edentulous Upper, Edentulous Lower   Pulmonary pneumonia, former smoker,    Pulmonary exam normal breath sounds clear to auscultation       Cardiovascular hypertension, Pt. on medications + CAD and + Peripheral Vascular Disease  negative cardio ROS Normal cardiovascular exam Rhythm:Regular Rate:Normal  ECHO 01-16-15: Study Conclusions  - Left ventricle: Abnormal septal motion The cavity size was mildly dilated. Wall thickness was increased in a pattern of mild LVH. Systolic function was normal. The estimated ejection fraction was in the range of 50% to 55%. Wall motion was normal; there were no regional wall motion abnormalities. - Aortic valve: There was trivial regurgitation. - Impressions: Cystic liver disease noted on subcostal images.    Neuro/Psych PSYCHIATRIC DISORDERS Depression ?seizure like activity times two today. TIA   GI/Hepatic Neg liver ROS, GERD  Medicated,  Endo/Other  diabetes  Renal/GU Renal disease  negative genitourinary   Musculoskeletal  (+) Arthritis ,   Abdominal   Peds negative pediatric ROS (+)  Hematology  (+) anemia ,   Anesthesia Other Findings   Reproductive/Obstetrics negative OB ROS                            Anesthesia Physical Anesthesia Plan  ASA: III and emergent  Anesthesia Plan: General   Post-op Pain Management:    Induction: Intravenous  Airway Management Planned: Oral ETT  Additional Equipment:   Intra-op Plan:   Post-operative Plan: Extubation in OR  Informed Consent: I have reviewed the patients History and Physical, chart, labs and discussed the procedure  including the risks, benefits and alternatives for the proposed anesthesia with the patient or authorized representative who has indicated his/her understanding and acceptance.   Dental advisory given  Plan Discussed with: CRNA  Anesthesia Plan Comments: (Second unit pRBC hanging.)       Anesthesia Quick Evaluation

## 2015-03-16 NOTE — ED Notes (Signed)
Per EMS, pt from [facility].  Pt c/o n/v since 7 am.  Pt denies abdominal pain.  Emesis with blood x 3.  2 weeks ago had feeding tube for loss of appetite.  Pt was to be d/c from facility today.  Vitals:96/70, hr 90, resp 16, 100% ra

## 2015-03-16 NOTE — H&P (Signed)
[facility] History and Physical  Brian Burns MVH:[id number] DOB: [birth year] DOA: 03/16/2015  Referring physician: Emergency Department PCP: Shirlee Latch, MD   CHIEF COMPLAINT:                   HPI: Brian Burns is a 70 y.o. male who was hospitalized several days in December through January 2017 for weight loss, vomiting, abdominal pain and acute on CKD. Marland Kitchen Patient had lost nearly 50 pounds in December. CT scan revealed unchanged calcifications near the pancreatic head.   Swallowing studies that admission raised suspicion of achalasia. Patient underwent upper endoscopy with empirical dilation. He had no improvement with oral intake, manometry study was ordered but patient did not cooperate. He was evaluated by surgery for a Heller myotomy but surgery did not want to proceed without an official diagnosis of achalasia.    During the aforementioned hospitalization patient's INR was supratherapeutic at 5.5. Coumadin was held, vitamin K given. INR trended down to therapeutic range and Coumadin was resumed. INR time of discharge was 2.3  Patient comes in today for evaluation of hematemesis. Patient has had 4 episodes of vomiting of bright red blood this morning. The first emesis was water followed by 4 episodes of hematemesis. Hemoglobin 8.2, down from 11.9 January 9th. Patient's INR is only 1.89. Patient had a normal bowel movement yesterday. No stools today. No rectal bleeding or melena reported . Patient has no chest pain, no abdominal pain. He denies dizziness or shortness of breath. Per family member patient has been swallowing his food at home without any difficulty despite recent problems.   ED COURSE:           Labs:   INR 1.89, hemoglobin 8.2 Potassium 3.2 Glucose 171 Lipase 112 Albumin 2.7   EKG:    Sinus rhythm Prolonged QT interval          QT / QTc 513/581          Medications  0.9 %  sodium chloride infusion (not administered)  pantoprazole (PROTONIX) 80 mg  in sodium chloride 0.9 % 100 mL IVPB (not administered)  pantoprazole (PROTONIX) 80 mg in sodium chloride 0.9 % 250 mL (0.32 mg/mL) infusion (not administered)  pantoprazole (PROTONIX) injection 40 mg (not administered)  prothrombin complex conc human (KCENTRA) IVPB 2,000 Units (not administered)  phytonadione (VITAMIN K) 10 mg in dextrose 5 % 50 mL IVPB (not administered)  sodium chloride 0.9 % bolus 500 mL (500 mLs Intravenous New Bag/Given 03/16/15 1029)    Review of Systems  Constitutional: Negative.   HENT: Negative.   Eyes: Negative.   Respiratory: Negative.   Cardiovascular: Negative.   Gastrointestinal: Positive for vomiting.  Genitourinary: Negative.   Musculoskeletal: Negative.   Skin: Negative.   Neurological: Negative.   Endo/Heme/Allergies: Negative.   Psychiatric/Behavioral: Negative.      Past Medical History  Diagnosis Date  . Hypertension   . HLD (hyperlipidemia)   . Erectile dysfunction   . Chronic back pain   . CAD (coronary artery disease)   . GERD (gastroesophageal reflux disease)   . Arthritis     knees,lower back  . Carotid artery disease (HCC)   . PVD (peripheral vascular disease) with claudication (HCC)   . AAA (abdominal aortic aneurysm) without rupture (HCC) 06/2014    no change in last exam from 2015  . History of CVA (cerebrovascular accident)     2009  &  2013  . Type 2 diabetes mellitus (HCC)  no meds per pt  . Atrial flutter with rapid ventricular response (HCC) 01/16/2015  . Atrial fibrillation (HCC)   . Idiopathic pancreatitis 12/2014    acute   Past Surgical History  Procedure Laterality Date  . Esophagogastroduodenoscopy N/A 05/15/2014    Procedure: ESOPHAGOGASTRODUODENOSCOPY (EGD);  Surgeon: Carman Ching, MD;  Location: [facility] ENDOSCOPY;  Service: Endoscopy;  Laterality: N/A;  . Cataract extraction w/ intraocular lens  implant, bilateral  april and may 2014  . Transthoracic echocardiogram  01-07-2012    mild to moderate dilated  LV,  ef 45-50%,  mild basal hypokinesis,  grade I diastolic  dysfunction/  trivial AR/  mild MR  . Cardiovascular stress test  08-09-2009    mild global hypokinesis,  no ischemia or scar/  ef 41%  . Cystoscopy with urethral dilatation N/A 11/24/2014    Procedure: CYSTOSCOPY WITH URETHRAL DILATATION;  Surgeon: Crist Fat, MD;  Location: [facility];  Service: Urology;  Laterality: N/A;  BALLOON DILATION        . Cystoscopy with retrograde urethrogram N/A 11/24/2014    Procedure: CYSTOSCOPY WITH RETROGRADE URETHROGRAM;  Surgeon: Crist Fat, MD;  Location: [facility];  Service: Urology;  Laterality: N/A;  . Esophagogastroduodenoscopy Left 02/02/2015    Procedure: ESOPHAGOGASTRODUODENOSCOPY (EGD);  Surgeon: Dorena Cookey, MD;  Location: [facility] ENDOSCOPY;  Service: Endoscopy;  Laterality: Left;    SOCIAL HISTORY:  reports that he quit smoking about 11 years ago. His smoking use included Cigarettes. He has a 20 pack-year smoking history. He has never used smokeless tobacco. He reports that he does not drink alcohol or use illicit drugs. Lives:  Residing at Rehab facility     Assistive devices:   Dan Humphreys needed for ambulation.   Allergies  Allergen Reactions  . Novocain [Procaine Hcl] Nausea And Vomiting  . Amlodipine     On MAR  . Aspirin     On MAR  . Atorvastatin     On MAR  . Calcium Carbonate     On MAR  . Doxycycline     On MAR    Family History  Problem Relation Age of Onset  . Lung disease Brother   . Kidney disease Brother   . Heart disease Brother     died at 23  . Heart disease Mother   . Kidney disease Mother   . Lung disease Mother     Prior to Admission medications   Medication Sig Start Date End Date Taking? Authorizing Provider  amLODipine (NORVASC) 10 MG tablet Take 1 tablet (10 mg total) by mouth daily. 02/24/14  Yes Stephanie Coup Street, MD  aspirin EC 81 MG tablet Take 81 mg by mouth daily.   Yes Historical Provider, MD   atorvastatin (LIPITOR) 40 MG tablet Take 1 tablet (40 mg total) by mouth daily. 02/27/15  Yes Erasmo Downer, MD  citalopram (CELEXA) 20 MG tablet Take 20 mg by mouth daily at 6 PM.   Yes Historical Provider, MD  finasteride (PROSCAR) 5 MG tablet Take 1 tablet (5 mg total) by mouth daily. 04/26/13  Yes Stephanie Coup Street, MD  gabapentin (NEURONTIN) 100 MG capsule Take 1 capsule (100 mg total) by mouth 3 (three) times daily. 07/23/14  Yes Stephanie Coup Street, MD  lipase/protease/amylase (CREON) 12000 units CPEP capsule Take 1 capsule (12,000 Units total) by mouth 3 (three) times daily with meals. 02/11/15  Yes Almon Hercules, MD  omeprazole (PRILOSEC) 20 MG capsule Take 1 capsule (20  mg total) by mouth daily. 01/22/15  Yes Loren Racer, MD  ondansetron (ZOFRAN) 4 MG tablet Take 1 tablet (4 mg total) by mouth every 6 (six) hours as needed for nausea or vomiting. 01/22/15  Yes Loren Racer, MD  OVER THE COUNTER MEDICATION Take 120 mLs by mouth 4 (four) times daily - after meals and at bedtime. MEDPASS   Yes Historical Provider, MD  phenazopyridine (PYRIDIUM) 200 MG tablet Take 1 tablet (200 mg total) by mouth 3 (three) times daily as needed for pain. 11/24/14  Yes Crist Fat, MD  sucralfate (CARAFATE) 1 G tablet Take 1 tablet (1 g total) by mouth 3 (three) times daily as needed. Patient taking differently: Take 1 g by mouth 3 (three) times daily as needed (Nausea/vomitting).  01/22/15  Yes Loren Racer, MD  tamsulosin (FLOMAX) 0.4 MG CAPS capsule Take 1 capsule (0.4 mg total) by mouth daily. 04/07/14  Yes Stephanie Coup Street, MD  warfarin (COUMADIN) 7.5 MG tablet Take 7.5 mg by mouth daily.   Yes Historical Provider, MD   PHYSICAL EXAM: Filed Vitals:   03/16/15 1610 03/16/15 0937 03/16/15 1010 03/16/15 1011  BP: 82/68 107/76  107/75  Pulse: 128 79  76  Temp: 98.4 F (36.9 C)     TempSrc: Oral     Resp: 20 20  16   Height:   6\' 4"  (1.93 m)   Weight:   81.647 kg (180 lb)     SpO2: 90% 97%  98%    Wt Readings from Last 3 Encounters:  03/16/15 81.647 kg (180 lb)  02/11/15 73.074 kg (161 lb 1.6 oz)  01/22/15 86.183 kg (190 lb)    General:  Pleasant black male. Appears calm and comfortable Eyes: PER, normal lids, irises & conjunctiva ENT: grossly normal hearing, lips & tongue Neck: no LAD, no masses Cardiovascular: RRR, no murmurs. No LE edema.  Respiratory: Respirations even and unlabored. Normal respiratory effort. Diminshed breath sounds at bilateral bases. .   Abdomen: soft, non-distended, non-tender, active bowel sounds. No obvious masses.  Skin: no rash seen on limited exam Musculoskeletal: grossly normal tone BUE/BLE Psychiatric: grossly normal mood and affect, speech fluent and appropriate Neurologic: grossly non-focal.         LABS ON ADMISSION:    Basic Metabolic Panel:  Recent Labs Lab 03/16/15 0933 03/16/15 0940  NA 137 140  K 3.1* 3.2*  CL 105 102  CO2 24  --   GLUCOSE 178* 171*  BUN 10 8  CREATININE 1.29* 1.10  CALCIUM 7.8*  --    Liver Function Tests:  Recent Labs Lab 03/16/15 0933  AST 33  ALT 39  ALKPHOS 109  BILITOT 0.4  PROT 6.0*  ALBUMIN 2.7*    CBC:  Recent Labs Lab 03/16/15 0933 03/16/15 0940  WBC 9.7  --   NEUTROABS 6.3  --   HGB 7.4* 8.2*  HCT 22.7* 24.0*  MCV 88.3  --   PLT 155  --    CREATININE: 1.1 (03/16/15 0940) Estimated creatinine clearance - 74.2 mL/min  Radiological Exams on Admission: Dg Chest Portable 1 View  03/16/2015  CLINICAL DATA:  Hematemesis. EXAM: PORTABLE CHEST 1 VIEW COMPARISON:  02/13/2015 FINDINGS: The heart size and mediastinal contours are within normal limits. Both lungs are clear. Interval clearing of bilateral infiltrates. The visualized skeletal structures are unremarkable. IMPRESSION: No active disease. Electronically Signed   By: Marlan Palau M.D.   On: 03/16/2015 10:22    ASSESSMENT / PLAN   GI  bleed with hypotension. Upper  bleeding insetting of  anticoagulation. Presents with hematemesis. Rule out [employer] tear, PUD. Mass not likely given unremarkable EGD several weeks ago.  -Admit to stepdown -PPI drip -coumadin reversal in process now though INR subtherapeutic -IV fluids for hypotension -getting 2 units of blood -monitor CBC -GI consult already placed by ED  Anemia of acute blood loss. Hgb down from 11.9 to 8.2 -monitor CBC, -getting two units of blood now  Hypokalemia. K+ 3.2.  -check Mg level -K+ 10 mEq x3 -am bmet  Chronic kidney disease (CKD), stage 3-4. -Cr at baseline  History of atrial fibrillation. Initially tachycardic but could been from upper GI bleed. Heart rate now controlled. -Monitor on telemetry.  Weight loss, unintentional and recent abnormal swallowing studies raising suspicion for achalasia.  -Patient has a follow-up gastroenterology scheduled  Hyperlipidemia -Continue statin when taking by mouth  BPH  -Continue home Proscar , Flomas when taking by mouth  Depression. -continue home Celexa when taking by mouth  Possible chronic pancreatitis by CTscan (calcifications).  -continue home creon when taking O  CONSULTANTS:  G[facility]    Code Status: DNR DVT Prophylaxis: SCDs Family Communication:  Patient alert, oriented and understands plan of care.  Disposition Plan: Discharge to home in 2-3 days   Time spent: 60 minutes Willette Cluster  NP [facility] Pager [phone]

## 2015-03-16 NOTE — Consult Note (Signed)
[facility] Consult Note  Referring Provider: No ref. provider found Primary Care Physician:  Lavon Paganini, MD Primary Gastroenterologist:  Dr.  Laurel Dimmer Complaint: Hematemesis HPI: Brian Burns is an 70 y.o. black male  with achalasia and weight loss with EGD and esophageal dilatation in January with no abnormalities found, developed bright red hematemesis on 4 occasions today, was anemic with hemoglobin of 8.2 and was tachycardic and hypotensive initially. He has received blood and vitamin K. His INR is 1.9. He is not having any chest pain. He has not had any bloody bowel movements. He denied any nonbloody emesis prior to hematemesis.  Past Medical History  Diagnosis Date  . Hypertension   . HLD (hyperlipidemia)   . Erectile dysfunction   . Chronic back pain   . CAD (coronary artery disease)   . GERD (gastroesophageal reflux disease)   . Arthritis     knees,lower back  . Carotid artery disease ([place])   . PVD (peripheral vascular disease) with claudication ([place])   . AAA (abdominal aortic aneurysm) without rupture ([place]) 06/2014    no change in last exam from 2015  . History of CVA (cerebrovascular accident)     2009  &  2013  . Type 2 diabetes mellitus (HCC)     no meds per pt  . Atrial flutter with rapid ventricular response ([place]) 01/16/2015  . Atrial fibrillation ([place])   . Idiopathic pancreatitis 12/2014    acute    Past Surgical History  Procedure Laterality Date  . Esophagogastroduodenoscopy N/A 05/15/2014    Procedure: ESOPHAGOGASTRODUODENOSCOPY (EGD);  Surgeon: Laurence Spates, MD;  Location: [facility] ENDOSCOPY;  Service: Endoscopy;  Laterality: N/A;  . Cataract extraction w/ intraocular lens  implant, bilateral  april and may 2014  . Transthoracic echocardiogram  01-07-2012    mild to moderate dilated LV,  ef 45-50%,  mild basal hypokinesis,  grade I diastolic  dysfunction/  trivial AR/  mild MR  . Cardiovascular stress test  08-09-2009    mild global  hypokinesis,  no ischemia or scar/  ef 41%  . Cystoscopy with urethral dilatation N/A 11/24/2014    Procedure: CYSTOSCOPY WITH URETHRAL DILATATION;  Surgeon: Ardis Hughs, MD;  Location: [facility];  Service: Urology;  Laterality: N/A;  BALLOON DILATION        . Cystoscopy with retrograde urethrogram N/A 11/24/2014    Procedure: CYSTOSCOPY WITH RETROGRADE URETHROGRAM;  Surgeon: Ardis Hughs, MD;  Location: [facility];  Service: Urology;  Laterality: N/A;  . Esophagogastroduodenoscopy Left 02/02/2015    Procedure: ESOPHAGOGASTRODUODENOSCOPY (EGD);  Surgeon: Teena Irani, MD;  Location: [facility] ENDOSCOPY;  Service: Endoscopy;  Laterality: Left;     (Not in a hospital admission)  Allergies:  Allergies  Allergen Reactions  . Novocain [Procaine Hcl] Nausea And Vomiting  . Amlodipine     On MAR  . Aspirin     On MAR  . Atorvastatin     On MAR  . Calcium Carbonate     On MAR  . Doxycycline     On MAR    Family History  Problem Relation Age of Onset  . Lung disease Brother   . Kidney disease Brother   . Heart disease Brother     died at 58  . Heart disease Mother   . Kidney disease Mother   . Lung disease Mother     Social History:  reports that he quit smoking about 11 years ago. His smoking use  included Cigarettes. He has a 20 pack-year smoking history. He has never used smokeless tobacco. He reports that he does not drink alcohol or use illicit drugs.  Review of Systems: negative except as above   Blood pressure 102/65, pulse 75, temperature 98 F (36.7 C), temperature source Oral, resp. rate 14, height '6\' 4"'  (1.93 m), weight 81.647 kg (180 lb), SpO2 100 %. Head: Normocephalic, without obvious abnormality, atraumatic Neck: no adenopathy, no carotid bruit, no JVD, supple, symmetrical, trachea midline and thyroid not enlarged, symmetric, no tenderness/mass/nodules Resp: clear to auscultation bilaterally Cardio: regular rate and  rhythm, S1, S2 normal, no murmur, click, rub or gallop GI: Abdomen soft nondistended with normoactive bowel sounds. No hepatosplenomegaly mass or guarding Extremities: extremities normal, atraumatic, no cyanosis or edema  Results for orders placed or performed during the hospital encounter of 03/16/15 (from the past 48 hour(s))  APTT     Status: Abnormal   Collection Time: 03/16/15  9:32 AM  Result Value Ref Range   aPTT 51 (H) 24 - 37 seconds    Comment:        IF BASELINE aPTT IS ELEVATED, SUGGEST PATIENT RISK ASSESSMENT BE USED TO DETERMINE APPROPRIATE ANTICOAGULANT THERAPY.   Comprehensive metabolic panel     Status: Abnormal   Collection Time: 03/16/15  9:33 AM  Result Value Ref Range   Sodium 137 135 - 145 mmol/L   Potassium 3.1 (L) 3.5 - 5.1 mmol/L   Chloride 105 101 - 111 mmol/L   CO2 24 22 - 32 mmol/L   Glucose, Bld 178 (H) 65 - 99 mg/dL   BUN 10 6 - 20 mg/dL   Creatinine, Ser 1.29 (H) 0.61 - 1.24 mg/dL   Calcium 7.8 (L) 8.9 - 10.3 mg/dL   Total Protein 6.0 (L) 6.5 - 8.1 g/dL   Albumin 2.7 (L) 3.5 - 5.0 g/dL   AST 33 15 - 41 U/L   ALT 39 17 - 63 U/L   Alkaline Phosphatase 109 38 - 126 U/L   Total Bilirubin 0.4 0.3 - 1.2 mg/dL   GFR calc non Af Amer 55 (L) >60 mL/min   GFR calc Af Amer >60 >60 mL/min    Comment: (NOTE) The eGFR has been calculated using the CKD EPI equation. This calculation has not been validated in all clinical situations. eGFR's persistently <60 mL/min signify possible Chronic Kidney Disease.    Anion gap 8 5 - 15  Protime-INR     Status: Abnormal   Collection Time: 03/16/15  9:33 AM  Result Value Ref Range   Prothrombin Time 21.6 (H) 11.6 - 15.2 seconds   INR 1.89 (H) 0.00 - 1.49  CBC with Differential     Status: Abnormal   Collection Time: 03/16/15  9:33 AM  Result Value Ref Range   WBC 9.7 4.0 - 10.5 K/uL   RBC 2.57 (L) 4.22 - 5.81 MIL/uL   Hemoglobin 7.4 (L) 13.0 - 17.0 g/dL   HCT 22.7 (L) 39.0 - 52.0 %   MCV 88.3 78.0 - 100.0 fL    MCH 28.8 26.0 - 34.0 pg   MCHC 32.6 30.0 - 36.0 g/dL   RDW 17.5 (H) 11.5 - 15.5 %   Platelets 155 150 - 400 K/uL   Neutrophils Relative % 65 %   Neutro Abs 6.3 1.7 - 7.7 K/uL   Lymphocytes Relative 25 %   Lymphs Abs 2.5 0.7 - 4.0 K/uL   Monocytes Relative 9 %   Monocytes Absolute 0.9 0.1 -  1.0 K/uL   Eosinophils Relative 1 %   Eosinophils Absolute 0.1 0.0 - 0.7 K/uL   Basophils Relative 0 %   Basophils Absolute 0.0 0.0 - 0.1 K/uL  Type and screen [place]     Status: None (Preliminary result)   Collection Time: 03/16/15  9:33 AM  Result Value Ref Range   ABO/RH(D) O POS    Antibody Screen NEG    Sample Expiration 03/19/2015    Unit Number [id number]    Blood Component Type RED CELLS,LR    Unit division 00    Status of Unit ALLOCATED    Transfusion Status OK TO TRANSFUSE    Crossmatch Result COMPATIBLE    Unit Number [id number]    Blood Component Type RED CELLS,LR    Unit division 00    Status of Unit ISSUED    Transfusion Status OK TO TRANSFUSE    Crossmatch Result Compatible   Lipase, blood     Status: Abnormal   Collection Time: 03/16/15  9:33 AM  Result Value Ref Range   Lipase 112 (H) 11 - 51 U/L  ABO/Rh     Status: None   Collection Time: 03/16/15  9:37 AM  Result Value Ref Range   ABO/RH(D) O POS   I-stat Chem 8, ED     Status: Abnormal   Collection Time: 03/16/15  9:40 AM  Result Value Ref Range   Sodium 140 135 - 145 mmol/L   Potassium 3.2 (L) 3.5 - 5.1 mmol/L   Chloride 102 101 - 111 mmol/L   BUN 8 6 - 20 mg/dL   Creatinine, Ser 1.10 0.61 - 1.24 mg/dL   Glucose, Bld 171 (H) 65 - 99 mg/dL   Calcium, Ion 1.09 (L) 1.13 - 1.30 mmol/L   TCO2 25 0 - 100 mmol/L   Hemoglobin 8.2 (L) 13.0 - 17.0 g/dL   HCT 24.0 (L) 39.0 - 52.0 %  Prepare RBC     Status: None   Collection Time: 03/16/15 10:02 AM  Result Value Ref Range   Order Confirmation ORDER PROCESSED BY BLOOD BANK    Dg Chest Portable 1 View  03/16/2015  CLINICAL DATA:   Hematemesis. EXAM: PORTABLE CHEST 1 VIEW COMPARISON:  02/13/2015 FINDINGS: The heart size and mediastinal contours are within normal limits. Both lungs are clear. Interval clearing of bilateral infiltrates. The visualized skeletal structures are unremarkable. IMPRESSION: No active disease. Electronically Signed   By: Franchot Gallo M.D.   On: 03/16/2015 10:22    Assessment: Hematemesis in a patient with achalasia with negative EGD 1 month ago, suspect Mallory-Weiss tear, missed peptic ulcer disease etc. Plan:  Will proceed with EGD urgently as well as transfusion and reversal of Coumadin. Be intubated for the procedure. He is agreeable to this with his daughter at his bedside and also sporting his decision. Ricke Kimoto C 03/16/2015, 12:20 PM  Pager [phone] If no answer or after 5 PM call [phone]

## 2015-03-16 NOTE — Progress Notes (Signed)
   03/16/15 1300  Clinical Encounter Type  Visited With Patient and family together  Visit Type Initial;Psychological support;Spiritual support;ED  Referral From Nurse  Consult/Referral To Chaplain  Spiritual Encounters  Spiritual Needs Emotional;Prayer;Other (Comment) (Pastoral Conversation/Support)  Stress Factors  Patient Stress Factors None identified  Family Stress Factors Health changes;Other (Comment) (Fear for patient's health)    I visited with the patient and his daughter per referral from the nurse who said that the patient was coughing up blood and that the daughter looked anxious.  When I entered the room, the patient was lying in bed with his eyes closed and the daughter was at his bedside. The daughter was tearful and stated that she was afraid for her father's health. The daughter has contacted their pastor and stated that he is on his way to the hospital.  The patient's daughter requested prayer. She asked me to pray for his complete healing and that the doctors can find out what is causing the patient to cough up blood.  Patient states that he has strong faith and knows that he will be well again. Patient states that he has gone through too much to not get through this.  The three of [place] prayed together while holding hands.  We will follow up with this patient and his daughter.    Chaplain Clint Bolder M.Div.

## 2015-03-16 NOTE — Anesthesia Procedure Notes (Signed)
Procedure Name: Intubation Date/Time: 03/16/2015 2:33 PM Performed by: Leroy Libman L Patient Re-evaluated:Patient Re-evaluated prior to inductionOxygen Delivery Method: Circle system utilized Preoxygenation: Pre-oxygenation with 100% oxygen Intubation Type: IV induction, Rapid sequence and Cricoid Pressure applied Laryngoscope Size: Miller and 3 Grade View: Grade I Tube type: Oral Tube size: 7.5 mm Number of attempts: 1 Airway Equipment and Method: Stylet Placement Confirmation: ETT inserted through vocal cords under direct vision,  breath sounds checked- equal and bilateral and positive ETCO2 Secured at: 21 cm Tube secured with: Tape Dental Injury: Teeth and Oropharynx as per pre-operative assessment

## 2015-03-16 NOTE — Clinical Social Work Note (Signed)
Clinical Social Work Assessment  Patient Details  Name: Brian Burns MRN: [id number] Date of Birth: Dec 04, 1945  Date of referral:  03/16/15               Reason for consult:  Other (Comment Required) (Patient from [facility])                Permission sought to share information with:    Permission granted to share information::     Name::        Agency::     Relationship::     Contact Information:     Housing/Transportation Living arrangements for the past 2 months:   (Patient has been at [facility]) Source of Information:  Adult Children Brian Burns, daughter [phone]) Patient Interpreter Needed:  None Criminal Activity/Legal Involvement Pertinent to Current Situation/Hospitalization:  No - Comment as needed Significant Relationships:  Adult Children Lives with:  Other (Comment) (Per daughter, patient resides with his wife) Do you feel safe going back to the place where you live?    Need for family participation in patient care:     Care giving concerns: Patient's daughter has concerns with patient coughing and throwing up blood.    Social Worker assessment / plan: CSW attempted to obtain information from patient at bedside. When patient began coughing, CSW obtained information from patient's daughter, Brian Burns. She reports patient was at [facility] and he was supposed to be released this morning. She reports upon patient being discharged from [facility], he was supposed to have gone back to his home to stay with his wife. She reports patient had been at the facility since January 14th. She reports while patient was at the facility, he had been "eating better". She reports patient had a feeding tube, however, he no longer has a feeding tube at this time. She reports he began throwing up blood and the paramedics transported him to the ER. She reports this morning was the first time patient had thrown up blood.   Daughter reports patient was admitted to [facility] Dec.  17th for six days and he was readmitted again on Dec. 27th. She reports patient has an appointment on February 28th with Dr. [name] for a gastro appointment. She asked CSW questions that would need answered by the MD or obtaining legal advice. No other questions noted for CSW at this time.   Brian Burns, daughter, [phone]                     Employment status:    Insurance information:  Other (Comment Required) [employer] Medicare) PT Recommendations:    Information / Referral to community resources:     Patient/Family's Response to care: Patient's daughter having concerns with patient coughing/throwing up blood.  Patient/Family's Understanding of and Emotional Response to Diagnosis, Current Treatment, and Prognosis: Unknown at this time  Emotional Assessment Appearance:  Appears stated age Attitude/Demeanor/Rapport:    Affect (typically observed):  Appropriate, Quiet Orientation:  Oriented to Self, Oriented to Place, Oriented to  Time, Oriented to Situation Alcohol / Substance use:  Not Applicable Psych involvement (Current and /or in the community):  No (Comment)  Discharge Needs  Concerns to be addressed:    Readmission within the last 30 days:    Current discharge risk:    Barriers to Discharge:  Continued Medical Work up   [employer], LCSW 03/16/2015, 2:13 PM

## 2015-03-16 NOTE — ED Notes (Signed)
Bed: RESB Expected date: 03/16/15 Expected time:  Means of arrival:  Comments: Patient in ENDO returning

## 2015-03-16 NOTE — Op Note (Addendum)
[facility] [address] [address] [place], [address]   ENDOSCOPY PROCEDURE REPORT  PATIENT: [name], [name]  MR#: [id number] BIRTHDATE: 12/05/1945 , 70  yrs. old GENDER: male ENDOSCOPIST: Dorena Cookey, MD REFERRED BY: PROCEDURE DATE:  03/24/15 PROCEDURE: ASA CLASS: INDICATIONS:  hematemesis MEDICATIONS: general anesthesia TOPICAL ANESTHETIC:  DESCRIPTION OF PROCEDURE: After the risks benefits and alternatives of the procedure were thoroughly explained, informed consent was obtained.  The EG-[id number] ([id number]) endoscope was introduced through the mouth and advanced to the second portion of the duodenum , Without limitations.  The instrument was slowly withdrawn as the mucosa was fully examined. Estimated blood loss is zero unless otherwise noted in this procedure report.    esophagus normal after clotted blood suctioned out. Stomach: Actively bleeding protuberant lesionjust distal to the cardia along the anterior wall unclear whether ulcer or AVM. 3 clips placed and complete cessation of bleeding. Remainder the stomach was normal duodenum: Normal              The scope was then withdrawn from the patient and the procedure completed.  COMPLICATIONS: There were no immediate complications.  ENDOSCOPIC IMPRESSION: actively bleedingulcer versus AVMjust distal to theGE junction,tamponaded with 3 endoclips.  RECOMMENDATIONS: monitor for recurrent bleeding, would treat empirically with proton pump inhibitor. Advance diet as tolerated Transfuse as necessary.  REPEAT EXAM:  eSigned:  Dorena Cookey, MD 24-Mar-2015 2:59 PM    CC:  CPT CODES: ICD CODES:  The ICD and CPT codes recommended by this software are interpretations from the data that the clinical staff has captured with the software.  The verification of the translation of this report to the ICD and CPT codes and modifiers is the sole responsibility of the health care institution and practicing physician  where this report was generated.  [employer] will not be held responsible for the validity of the ICD and CPT codes included on this report.  AMA assumes no liability for data contained or not contained herein. CPT is a Publishing rights manager of the [employer].  PATIENT NAME:  [name], [name] MR#: [id number]

## 2015-03-16 NOTE — Progress Notes (Addendum)
CSW first attempt to see patient- Nurse and Nurse Tech were assisting patient in the room. CSW was informed that patient was moved to RESB.  CSW attempted to obtain information from patient at bedside. When patient began coughing, CSW obtained information from patient's daughter, [name]. She reports patient was at [facility] and he was supposed to be released this morning. She reports upon patient being discharged from [facility], he was supposed to have gone back to his home to stay with his wife. She reports patient had been at the facility since January 14th. She reports while patient was at the facility, he had been "eating better". She reports patient had a feeding tube, however, he no longer has a feeding tube at this time. She reports he began throwing up blood and the paramedics transported him to the ER. She reports this morning was the first time patient had thrown up blood.   Daughter reports patient was admitted to [facility] Dec. 17th for six days and he was readmitted again on Dec. 27th. She reports patient has an appointment on February 28th with Dr. [name] for a gastro appointment. She asked CSW questions that would need answered by the MD or obtaining legal advice. No other questions noted for CSW at this time.   [name], daughter, [phone]     Elenore Paddy [phone] ED CSW 03/16/2015 2:40 PM

## 2015-03-16 NOTE — ED Provider Notes (Signed)
CSN: [id number]     Arrival date & time 03/16/15  0901 History   First MD Initiated Contact with Patient 03/16/15 0919     Chief Complaint  Patient presents with  . Hematemesis      HPI Patient presents with vomiting blood. He has been at nursing home and was due to be discharged today. He hasn't vomited up blood at least 3 times. He is felt like passing out. Denies abdominal pain currently. No fevers or chills. Denies nosebleed. Upon initial arrival has blood pressure of 80 with a heart rate 130. He is on Coumadin for atrial fibrillation. He had EGD done around a month ago that showed esophageal narrowing but no other active bleeding.   Past Medical History  Diagnosis Date  . Hypertension   . HLD (hyperlipidemia)   . Erectile dysfunction   . Chronic back pain   . CAD (coronary artery disease)   . GERD (gastroesophageal reflux disease)   . Arthritis     knees,lower back  . Carotid artery disease (HCC)   . PVD (peripheral vascular disease) with claudication (HCC)   . AAA (abdominal aortic aneurysm) without rupture (HCC) 06/2014    no change in last exam from 2015  . History of CVA (cerebrovascular accident)     2009  &  2013  . Type 2 diabetes mellitus (HCC)     no meds per pt  . Atrial flutter with rapid ventricular response (HCC) 01/16/2015  . Atrial fibrillation (HCC)   . Idiopathic pancreatitis 12/2014    acute   Past Surgical History  Procedure Laterality Date  . Esophagogastroduodenoscopy N/A 05/15/2014    Procedure: ESOPHAGOGASTRODUODENOSCOPY (EGD);  Surgeon: Carman Ching, MD;  Location: [facility] ENDOSCOPY;  Service: Endoscopy;  Laterality: N/A;  . Cataract extraction w/ intraocular lens  implant, bilateral  april and may 2014  . Transthoracic echocardiogram  01-07-2012    mild to moderate dilated LV,  ef 45-50%,  mild basal hypokinesis,  grade I diastolic  dysfunction/  trivial AR/  mild MR  . Cardiovascular stress test  08-09-2009    mild global hypokinesis,  no ischemia  or scar/  ef 41%  . Cystoscopy with urethral dilatation N/A 11/24/2014    Procedure: CYSTOSCOPY WITH URETHRAL DILATATION;  Surgeon: Crist Fat, MD;  Location: [facility];  Service: Urology;  Laterality: N/A;  BALLOON DILATION        . Cystoscopy with retrograde urethrogram N/A 11/24/2014    Procedure: CYSTOSCOPY WITH RETROGRADE URETHROGRAM;  Surgeon: Crist Fat, MD;  Location: [facility];  Service: Urology;  Laterality: N/A;  . Esophagogastroduodenoscopy Left 02/02/2015    Procedure: ESOPHAGOGASTRODUODENOSCOPY (EGD);  Surgeon: Dorena Cookey, MD;  Location: [facility] ENDOSCOPY;  Service: Endoscopy;  Laterality: Left;   Family History  Problem Relation Age of Onset  . Lung disease Brother   . Kidney disease Brother   . Heart disease Brother     died at 86  . Heart disease Mother   . Kidney disease Mother   . Lung disease Mother    Social History  Substance Use Topics  . Smoking status: Former Smoker -- 0.50 packs/day for 40 years    Types: Cigarettes    Quit date: 01/29/2004  . Smokeless tobacco: Never Used  . Alcohol Use: No    Review of Systems  Constitutional: Negative for activity change and appetite change.  Respiratory: Negative for shortness of breath.   Cardiovascular: Negative for chest pain.  Gastrointestinal:  Positive for vomiting. Negative for abdominal pain and blood in stool.      Allergies  Novocain  Home Medications   Prior to Admission medications   Medication Sig Start Date End Date Taking? Authorizing Provider  amLODipine (NORVASC) 10 MG tablet Take 1 tablet (10 mg total) by mouth daily. 02/24/14  Yes Stephanie Coup Street, MD  aspirin EC 81 MG tablet Take 81 mg by mouth daily.   Yes Historical Provider, MD  atorvastatin (LIPITOR) 40 MG tablet Take 1 tablet (40 mg total) by mouth daily. 02/27/15  Yes Erasmo Downer, MD  citalopram (CELEXA) 20 MG tablet Take 20 mg by mouth daily at 6 PM.   Yes Historical  Provider, MD  finasteride (PROSCAR) 5 MG tablet Take 1 tablet (5 mg total) by mouth daily. 04/26/13  Yes Stephanie Coup Street, MD  gabapentin (NEURONTIN) 100 MG capsule Take 1 capsule (100 mg total) by mouth 3 (three) times daily. 07/23/14  Yes Stephanie Coup Street, MD  lipase/protease/amylase (CREON) 12000 units CPEP capsule Take 1 capsule (12,000 Units total) by mouth 3 (three) times daily with meals. 02/11/15  Yes Almon Hercules, MD  omeprazole (PRILOSEC) 20 MG capsule Take 1 capsule (20 mg total) by mouth daily. 01/22/15  Yes Loren Racer, MD  ondansetron (ZOFRAN) 4 MG tablet Take 1 tablet (4 mg total) by mouth every 6 (six) hours as needed for nausea or vomiting. 01/22/15  Yes Loren Racer, MD  OVER THE COUNTER MEDICATION Take 120 mLs by mouth 4 (four) times daily - after meals and at bedtime. MEDPASS   Yes Historical Provider, MD  tamsulosin (FLOMAX) 0.4 MG CAPS capsule Take 1 capsule (0.4 mg total) by mouth daily. 04/07/14  Yes Stephanie Coup Street, MD  mirtazapine (REMERON) 15 MG tablet Take 1 tablet (15 mg total) by mouth at bedtime. Patient not taking: Reported on 02/26/2015 02/11/15   Almon Hercules, MD  Nutritional Supplements (FEEDING SUPPLEMENT, JEVITY 1.2 CAL,) LIQD Place 1,000 mLs into feeding tube continuous. Patient not taking: Reported on 03/15/2015 02/11/15   Almon Hercules, MD  phenazopyridine (PYRIDIUM) 200 MG tablet Take 1 tablet (200 mg total) by mouth 3 (three) times daily as needed for pain. 11/24/14   Crist Fat, MD  sucralfate (CARAFATE) 1 G tablet Take 1 tablet (1 g total) by mouth 3 (three) times daily as needed. Patient taking differently: Take 1 g by mouth 3 (three) times daily as needed (Nausea/vomitting).  01/22/15   Loren Racer, MD  warfarin (COUMADIN) 10 MG tablet Take 10 mg by mouth once. Pt got one dose on 03/13/15. Then switched to 7.5mg  daily    Historical Provider, MD  warfarin (COUMADIN) 2.5 MG tablet Take 0.5 tablets (1.25 mg total) by mouth daily.  Starting 12/24, then as directed from INR check on 12/27 Patient not taking: Reported on 02/15/2015 02/11/15   Almon Hercules, MD  warfarin (COUMADIN) 7.5 MG tablet Take 7.5 mg by mouth daily.    Historical Provider, MD   BP 82/68 mmHg  Pulse 128  Temp(Src) 98.4 F (36.9 C) (Oral)  Resp 20  SpO2 90% Physical Exam  Constitutional: He appears well-developed.  HENT:  Some blood inposterior pharynx. Blood on face also.  Eyes: EOM are normal.  Cardiovascular:  Tachycardia  Pulmonary/Chest: Effort normal.  Abdominal: There is no tenderness.  Musculoskeletal: Normal range of motion.  Neurological: He is alert.  Skin: There is pallor.    ED Course  Procedures (including critical care time) Labs  Review Labs Reviewed  COMPREHENSIVE METABOLIC PANEL  PROTIME-INR  CBC WITH DIFFERENTIAL/PLATELET  I-STAT CHEM 8, ED  TYPE AND SCREEN    Imaging Review No results found. I have personally reviewed and evaluated these images and lab results as part of my medical decision-making.   EKG Interpretation   Date/Time:  Thursday March 16 2015 09:31:13 EST Ventricular Rate:  84 PR Interval:  140 QRS Duration: 105 QT Interval:  407 QTC Calculation: 481 R Axis:   67 Text Interpretation:  Sinus rhythm Nonspecific T abnormalities, lateral  leads Borderline prolonged QT interval Baseline wander in lead(s) I III  aVL V6 Confirmed by Edris Friedt  MD, Keymoni Mccaster ([id number]) on 03/16/2015 9:33:19 AM      MDM   Final diagnoses:  None    Patient presented with upper GI bleed. Hematemesis. Initially had 3 or 4 episodes of bloody emesis here. Initially hypertensive but blood pressure improved. Attempted to admit to internal medicine for step down bed here since he was a DO NOT INTUBATE. Internal medicine felt he was stable for transfer to: To be admitted to his primary care doctor.   admission was put into the family practice doctor at [place]. However while in the ER here he had another episode of a  large amount hematemesis and had a generalized tonic-clonic seizure. Some mild confusion after. No history of seizure.  views are to been reversed with FEIBA. He was given 1 unit of O- blood and had had one typed unit. GI has come to see the patient and will take him to endoscopy. Patient is willing be intubated at least for the procedure. Discussed with ICU, [id number] KMarland KitchenMarland Kitchenen<MEASUREMENA29[place]6(684) [id number] [id number](463[facility] [id number] 2Lor73[place] EndosKentuckycopyDelight Ovens Ivanntucky6684-6Marland KitchenMarland Kitchen51-1LorMarland Ki henP itical care was necessary to treat or prevent imminent or life-threatening deterioration. Critical care was time spent personally by me on the following activities: development of treatment plan with patient and/or surrogate as well as nursing, discussions with consultants, evaluation of patient's response to treatment, examination of patient, obtaining history from patient or surrogate, ordering and performing treatments and interventions, ordering and review of laboratory studies, ordering and review of radiographic studies, pulse oximetry and re-evaluation of patient's condition.   Markia Kyer, MD 03/16/15 1317

## 2015-03-16 NOTE — Anesthesia Postprocedure Evaluation (Signed)
Anesthesia Post Note  Patient: Brian Burns  Procedure(s) Performed: Procedure(s) (LRB): ESOPHAGOGASTRODUODENOSCOPY (EGD) (Left)  Patient location during evaluation: PACU Anesthesia Type: General Level of consciousness: awake and alert Pain management: pain level controlled Vital Signs Assessment: post-procedure vital signs reviewed and stable Respiratory status: spontaneous breathing, nonlabored ventilation, respiratory function stable and patient connected to nasal cannula oxygen Cardiovascular status: blood pressure returned to baseline and stable Postop Assessment: no signs of nausea or vomiting Anesthetic complications: no    Last Vitals:  Filed Vitals:   03/16/15 1530 03/16/15 1615  BP: 102/74 120/81  Pulse: 75 64  Temp:  36.8 C  Resp: 23 20    Last Pain:  Filed Vitals:   03/16/15 1615  PainSc: 0-No pain                 Bernarr Longsworth J

## 2015-03-16 NOTE — ED Notes (Signed)
Pt transported to endoscopy. Will return afterwards to ED.

## 2015-03-17 ENCOUNTER — Encounter (HOSPITAL_COMMUNITY): Payer: Self-pay | Admitting: Gastroenterology

## 2015-03-17 ENCOUNTER — Telehealth: Payer: Self-pay | Admitting: Family Medicine

## 2015-03-17 DIAGNOSIS — N183 Chronic kidney disease, stage 3 (moderate): Secondary | ICD-10-CM

## 2015-03-17 DIAGNOSIS — E785 Hyperlipidemia, unspecified: Secondary | ICD-10-CM

## 2015-03-17 DIAGNOSIS — R739 Hyperglycemia, unspecified: Secondary | ICD-10-CM

## 2015-03-17 DIAGNOSIS — F329 Major depressive disorder, single episode, unspecified: Secondary | ICD-10-CM

## 2015-03-17 DIAGNOSIS — K922 Gastrointestinal hemorrhage, unspecified: Secondary | ICD-10-CM | POA: Diagnosis present

## 2015-03-17 LAB — URINALYSIS, ROUTINE W REFLEX MICROSCOPIC
Bilirubin Urine: NEGATIVE
Glucose, UA: NEGATIVE mg/dL
Ketones, ur: NEGATIVE mg/dL
NITRITE: POSITIVE — AB
PH: 6 (ref 5.0–8.0)
Protein, ur: NEGATIVE mg/dL
SPECIFIC GRAVITY, URINE: 1.013 (ref 1.005–1.030)

## 2015-03-17 LAB — COMPREHENSIVE METABOLIC PANEL
ALBUMIN: 2.3 g/dL — AB (ref 3.5–5.0)
ALK PHOS: 82 U/L (ref 38–126)
ALT: 30 U/L (ref 17–63)
ANION GAP: 7 (ref 5–15)
AST: 24 U/L (ref 15–41)
BUN: 19 mg/dL (ref 6–20)
CALCIUM: 7.3 mg/dL — AB (ref 8.9–10.3)
CHLORIDE: 108 mmol/L (ref 101–111)
CO2: 22 mmol/L (ref 22–32)
Creatinine, Ser: 1.06 mg/dL (ref 0.61–1.24)
GFR calc non Af Amer: >60 mL/min (ref >60)
GLUCOSE: 100 mg/dL — AB (ref 65–99)
POTASSIUM: 3.6 mmol/L (ref 3.5–5.1)
SODIUM: 137 mmol/L (ref 135–145)
Total Bilirubin: 0.5 mg/dL (ref 0.3–1.2)
Total Protein: 5 g/dL — ABNORMAL LOW (ref 6.5–8.1)

## 2015-03-17 LAB — URINE MICROSCOPIC-ADD ON

## 2015-03-17 LAB — CBC
HCT: 22.3 % — ABNORMAL LOW (ref 39.0–52.0)
Hemoglobin: 7.5 g/dL — ABNORMAL LOW (ref 13.0–17.0)
MCH: 29.2 pg (ref 26.0–34.0)
MCHC: 33.6 g/dL (ref 30.0–36.0)
MCV: 86.8 fL (ref 78.0–100.0)
PLATELETS: 108 10*3/uL — AB (ref 150–400)
RBC: 2.57 MIL/uL — ABNORMAL LOW (ref 4.22–5.81)
RDW: 16.8 % — AB (ref 11.5–15.5)
WBC: 9.3 10*3/uL (ref 4.0–10.5)

## 2015-03-17 LAB — PROTIME-INR
INR: 1.21 (ref 0.00–1.49)
INR: 1.19 (ref 0.00–1.49)
INR: 1.22 (ref 0.00–1.49)
PROTHROMBIN TIME: 15.5 s — AB (ref 11.6–15.2)
Prothrombin Time: 15.4 seconds — ABNORMAL HIGH (ref 11.6–15.2)
Prothrombin Time: 15.3 seconds — ABNORMAL HIGH (ref 11.6–15.2)

## 2015-03-17 LAB — MRSA PCR SCREENING: MRSA BY PCR: POSITIVE — AB

## 2015-03-17 LAB — PREPARE RBC (CROSSMATCH)

## 2015-03-17 MED ORDER — SUCRALFATE 1 G PO TABS: 1.0000 g | ORAL_TABLET | Freq: Three times a day (TID) | ORAL | Status: DC | PRN

## 2015-03-17 MED ORDER — MUPIROCIN 2 % EX OINT
1.0000 "application " | TOPICAL_OINTMENT | Freq: Two times a day (BID) | CUTANEOUS | Status: AC
  Administered 2015-03-17 – 2015-03-22 (×10): 1 via NASAL
  Filled 2015-03-17 (×6): qty 22

## 2015-03-17 MED ORDER — CITALOPRAM HYDROBROMIDE 20 MG PO TABS
20.0000 mg | ORAL_TABLET | Freq: Every day | ORAL | Status: DC
  Administered 2015-03-17 – 2015-03-21 (×5): 20 mg via ORAL
  Filled 2015-03-17 (×6): qty 1

## 2015-03-17 MED ORDER — ONDANSETRON HCL 4 MG/2ML IJ SOLN: 4.0000 mg | Freq: Four times a day (QID) | INTRAMUSCULAR | Status: DC | PRN

## 2015-03-17 MED ORDER — CHLORHEXIDINE GLUCONATE CLOTH 2 % EX PADS
6.0000 | MEDICATED_PAD | Freq: Every day | CUTANEOUS | Status: AC
  Administered 2015-03-18 – 2015-03-22 (×5): 6 via TOPICAL

## 2015-03-17 MED ORDER — GABAPENTIN 100 MG PO CAPS
100.0000 mg | ORAL_CAPSULE | Freq: Three times a day (TID) | ORAL | Status: DC
  Administered 2015-03-17 – 2015-03-22 (×15): 100 mg via ORAL
  Filled 2015-03-17 (×17): qty 1

## 2015-03-17 MED ORDER — PHENAZOPYRIDINE HCL 200 MG PO TABS
200.0000 mg | ORAL_TABLET | Freq: Three times a day (TID) | ORAL | Status: DC
  Administered 2015-03-17 – 2015-03-19 (×5): 200 mg via ORAL
  Filled 2015-03-17 (×14): qty 1

## 2015-03-17 MED ORDER — PANCRELIPASE (LIP-PROT-AMYL) 12000-38000 UNITS PO CPEP
12000.0000 [IU] | ORAL_CAPSULE | Freq: Three times a day (TID) | ORAL | Status: DC
  Administered 2015-03-17 – 2015-03-22 (×16): 12000 [IU] via ORAL
  Filled 2015-03-17 (×18): qty 1

## 2015-03-17 MED ORDER — FINASTERIDE 5 MG PO TABS
5.0000 mg | ORAL_TABLET | Freq: Every day | ORAL | Status: DC
  Administered 2015-03-17 – 2015-03-22 (×6): 5 mg via ORAL
  Filled 2015-03-17 (×7): qty 1

## 2015-03-17 MED ORDER — MAGNESIUM SULFATE 2 GM/50ML IV SOLN
2.0000 g | Freq: Once | INTRAVENOUS | Status: AC
  Administered 2015-03-17: 2 g via INTRAVENOUS
  Filled 2015-03-17: qty 50

## 2015-03-17 MED ORDER — SODIUM CHLORIDE 0.9 % IV SOLN
Freq: Once | INTRAVENOUS | Status: AC
  Administered 2015-03-17: 12:00:00 via INTRAVENOUS

## 2015-03-17 MED ORDER — SODIUM CHLORIDE 0.9% FLUSH
3.0000 mL | Freq: Two times a day (BID) | INTRAVENOUS | Status: DC
  Administered 2015-03-17 – 2015-03-21 (×8): 3 mL via INTRAVENOUS

## 2015-03-17 MED ORDER — ATORVASTATIN CALCIUM 40 MG PO TABS
40.0000 mg | ORAL_TABLET | Freq: Every day | ORAL | Status: DC
  Administered 2015-03-17 – 2015-03-21 (×5): 40 mg via ORAL
  Filled 2015-03-17 (×6): qty 1

## 2015-03-17 MED ORDER — SODIUM CHLORIDE 0.9 % IV SOLN: INTRAVENOUS | Status: DC

## 2015-03-17 MED ORDER — TAMSULOSIN HCL 0.4 MG PO CAPS
0.4000 mg | ORAL_CAPSULE | Freq: Every day | ORAL | Status: DC
  Administered 2015-03-17 – 2015-03-22 (×6): 0.4 mg via ORAL
  Filled 2015-03-17 (×6): qty 1

## 2015-03-17 MED ORDER — ONDANSETRON HCL 4 MG PO TABS: 4.0000 mg | ORAL_TABLET | Freq: Four times a day (QID) | ORAL | Status: DC | PRN

## 2015-03-17 NOTE — Consult Note (Signed)
Urology Consult  Referring physician:Abrol, MD Reason for referral: Urinary retention with history of urethral stricture disease and inability to replace Foley catheter  History of Present Illness: Brian Burns is currently 70 years of age.  He is known to one of my partners Dr. Burman Nieves for a history of anterior urethral stricture.  In early December 2016 he was in urinary retention.  Previously in October 2016 he required a trip to the operating room to successfully treat a urethral stricture and to place a urethral catheter.  He initially voided essentially failed a voiding trial and again had to have a catheter reinserted in December 2016.  He is canceled at least 3 appointments for follow-up here in our office.  The patient presented yesterday to the emergency room with a upper GI bleed.  Last night nursing staff in the ICU apparently were concerned about some leakage and/or purulent drainage through or around the Foley catheter.  A decision was made to remove the catheter.  It is unclear to me at this time who made that decision.  It also remains unclear to me whether an attempt was made to replace the Foley catheter at that time but subsequent efforts were apparently unsuccessful and we were contacted for the first time this morning about the situation.  The patient is currently without a catheter.  I presented to the ICU with plans to perform bedside cystoscopy and to help in replacement of urethral catheter that has been indwelling now for approximately 2 months.  The patient however adamantly refuses attempts a catheter reinsertion and/or cystoscopy.  He has been able to void approximately 100 mL.  Past Medical History  Diagnosis Date  . Hypertension   . HLD (hyperlipidemia)   . Erectile dysfunction   . Chronic back pain   . CAD (coronary artery disease)   . GERD (gastroesophageal reflux disease)   . Arthritis     knees,lower back  . Carotid artery disease ([place])   . PVD (peripheral  vascular disease) with claudication ([place])   . AAA (abdominal aortic aneurysm) without rupture ([place]) 06/2014    no change in last exam from 2015  . History of CVA (cerebrovascular accident)     2009  &  2013  . Type 2 diabetes mellitus ([facility])     no meds per pt  . Atrial flutter with rapid ventricular response ([place]) 01/16/2015  . Atrial fibrillation ([place])   . Idiopathic pancreatitis 12/2014    acute   Past Surgical History  Procedure Laterality Date  . Esophagogastroduodenoscopy N/A 05/15/2014    Procedure: ESOPHAGOGASTRODUODENOSCOPY (EGD);  Surgeon: Laurence Spates, MD;  Location: [facility] ENDOSCOPY;  Service: Endoscopy;  Laterality: N/A;  . Cataract extraction w/ intraocular lens  implant, bilateral  april and may 2014  . Transthoracic echocardiogram  01-07-2012    mild to moderate dilated LV,  ef 45-50%,  mild basal hypokinesis,  grade I diastolic  dysfunction/  trivial AR/  mild MR  . Cardiovascular stress test  08-09-2009    mild global hypokinesis,  no ischemia or scar/  ef 41%  . Cystoscopy with urethral dilatation N/A 11/24/2014    Procedure: CYSTOSCOPY WITH URETHRAL DILATATION;  Surgeon: Ardis Hughs, MD;  Location: [facility];  Service: Urology;  Laterality: N/A;  BALLOON DILATION        . Cystoscopy with retrograde urethrogram N/A 11/24/2014    Procedure: CYSTOSCOPY WITH RETROGRADE URETHROGRAM;  Surgeon: Ardis Hughs, MD;  Location: [facility];  Service: Urology;  Laterality: N/A;  . Esophagogastroduodenoscopy Left 02/02/2015    Procedure: ESOPHAGOGASTRODUODENOSCOPY (EGD);  Surgeon: Teena Irani, MD;  Location: [facility] ENDOSCOPY;  Service: Endoscopy;  Laterality: Left;  . Esophagogastroduodenoscopy Left 03/16/2015    Procedure: ESOPHAGOGASTRODUODENOSCOPY (EGD);  Surgeon: Teena Irani, MD;  Location: [facility] ENDOSCOPY;  Service: Endoscopy;  Laterality: Left;    Medications:  Scheduled: . atorvastatin  40 mg Oral q1800  . citalopram  20 mg Oral q1800   . finasteride  5 mg Oral Daily  . gabapentin  100 mg Oral TID  . lipase/protease/amylase  12,000 Units Oral TID WC  . [START ON 03/19/2015] pantoprazole (PROTONIX) IV  40 mg Intravenous Q12H  . phenazopyridine  200 mg Oral TID WC  . sodium chloride flush  3 mL Intravenous Q12H  . sodium chloride flush  3 mL Intravenous Q12H  . tamsulosin  0.4 mg Oral Daily    Allergies:  Allergies  Allergen Reactions  . Novocain [Procaine Hcl] Nausea And Vomiting  . Amlodipine     On MAR  . Aspirin     On MAR  . Atorvastatin     On MAR  . Calcium Carbonate     On MAR  . Doxycycline     On MAR    Family History  Problem Relation Age of Onset  . Lung disease Brother   . Kidney disease Brother   . Heart disease Brother     died at 51  . Heart disease Mother   . Kidney disease Mother   . Lung disease Mother     Social History:  reports that he quit smoking about 11 years ago. His smoking use included Cigarettes. He has a 20 pack-year smoking history. He has never used smokeless tobacco. He reports that he does not drink alcohol or use illicit drugs.  ROSPositive for hematemesis and previous abdominal pain.  Patient is currently denying any ongoing discomfort.  He does have some weakness.  He denies dysuria or hematuria with recent urination.  He complained of penile discomfort now resolved with catheter removal.  Physical Exam:  Vital signs in last 24 hours: Temp:  [97.9 F (36.6 C)-98.5 F (36.9 C)] 98 F (36.7 C) (02/17 1221) Pulse Rate:  [53-90] 61 (02/17 1221) Resp:  [10-23] 14 (02/17 1221) BP: (102-140)/(69-92) 122/80 mmHg (02/17 1221) SpO2:  [94 %-100 %] 100 % (02/17 1221) Weight:  [81.6 kg (179 lb 14.3 oz)-81.647 kg (180 lb)] 81.6 kg (179 lb 14.3 oz) (02/17 0400)  Constitutional: Vital signs reviewed. WD WN in NAD Head: Normocephalic and atraumatic   Eyes: PERRL, No scleral icterus.  Neck: Supple No  Gross JVD Cardiovascular: RRR Pulmonary/Chest: Normal effort Abdominal:  Soft. Non-tender Genitourinary:wnl Extremities: No cyanosis or edema  Neurological: Grossly non-focal.  Skin: Warm,very dry and intact. No rash, cyanosis   Laboratory Data:  Results for orders placed or performed during the hospital encounter of 03/16/15 (from the past 72 hour(s))  APTT     Status: Abnormal   Collection Time: 03/16/15  9:32 AM  Result Value Ref Range   aPTT 51 (H) 24 - 37 seconds    Comment:        IF BASELINE aPTT IS ELEVATED, SUGGEST PATIENT RISK ASSESSMENT BE USED TO DETERMINE APPROPRIATE ANTICOAGULANT THERAPY.   Comprehensive metabolic panel     Status: Abnormal   Collection Time: 03/16/15  9:33 AM  Result Value Ref Range   Sodium 137 135 - 145 mmol/L   Potassium 3.1 (L)  3.5 - 5.1 mmol/L   Chloride 105 101 - 111 mmol/L   CO2 24 22 - 32 mmol/L   Glucose, Bld 178 (H) 65 - 99 mg/dL   BUN 10 6 - 20 mg/dL   Creatinine, Ser 1.29 (H) 0.61 - 1.24 mg/dL   Calcium 7.8 (L) 8.9 - 10.3 mg/dL   Total Protein 6.0 (L) 6.5 - 8.1 g/dL   Albumin 2.7 (L) 3.5 - 5.0 g/dL   AST 33 15 - 41 U/L   ALT 39 17 - 63 U/L   Alkaline Phosphatase 109 38 - 126 U/L   Total Bilirubin 0.4 0.3 - 1.2 mg/dL   GFR calc non Af Amer 55 (L) >60 mL/min   GFR calc Af Amer >60 >60 mL/min    Comment: (NOTE) The eGFR has been calculated using the CKD EPI equation. This calculation has not been validated in all clinical situations. eGFR's persistently <60 mL/min signify possible Chronic Kidney Disease.    Anion gap 8 5 - 15  Protime-INR     Status: Abnormal   Collection Time: 03/16/15  9:33 AM  Result Value Ref Range   Prothrombin Time 21.6 (H) 11.6 - 15.2 seconds   INR 1.89 (H) 0.00 - 1.49  CBC with Differential     Status: Abnormal   Collection Time: 03/16/15  9:33 AM  Result Value Ref Range   WBC 9.7 4.0 - 10.5 K/uL   RBC 2.57 (L) 4.22 - 5.81 MIL/uL   Hemoglobin 7.4 (L) 13.0 - 17.0 g/dL   HCT 22.7 (L) 39.0 - 52.0 %   MCV 88.3 78.0 - 100.0 fL   MCH 28.8 26.0 - 34.0 pg   MCHC 32.6 30.0  - 36.0 g/dL   RDW 17.5 (H) 11.5 - 15.5 %   Platelets 155 150 - 400 K/uL   Neutrophils Relative % 65 %   Neutro Abs 6.3 1.7 - 7.7 K/uL   Lymphocytes Relative 25 %   Lymphs Abs 2.5 0.7 - 4.0 K/uL   Monocytes Relative 9 %   Monocytes Absolute 0.9 0.1 - 1.0 K/uL   Eosinophils Relative 1 %   Eosinophils Absolute 0.1 0.0 - 0.7 K/uL   Basophils Relative 0 %   Basophils Absolute 0.0 0.0 - 0.1 K/uL  Type and screen Advance     Status: None (Preliminary result)   Collection Time: 03/16/15  9:33 AM  Result Value Ref Range   ABO/RH(D) O POS    Antibody Screen NEG    Sample Expiration 03/19/2015    Unit Number [id number]    Blood Component Type RED CELLS,LR    Unit division 00    Status of Unit ISSUED,FINAL    Transfusion Status OK TO TRANSFUSE    Crossmatch Result COMPATIBLE    Unit Number [id number]    Blood Component Type RED CELLS,LR    Unit division 00    Status of Unit ISSUED,FINAL    Transfusion Status OK TO TRANSFUSE    Crossmatch Result Compatible    Unit Number [id number]    Blood Component Type RED CELLS,LR    Unit division 00    Status of Unit ISSUED    Transfusion Status OK TO TRANSFUSE    Crossmatch Result Compatible    Unit Number [id number]    Blood Component Type RED CELLS,LR    Unit division 00    Status of Unit ALLOCATED    Transfusion Status OK TO TRANSFUSE    Crossmatch Result Compatible  Lipase, blood     Status: Abnormal   Collection Time: 03/16/15  9:33 AM  Result Value Ref Range   Lipase 112 (H) 11 - 51 U/L  ABO/Rh     Status: None   Collection Time: 03/16/15  9:37 AM  Result Value Ref Range   ABO/RH(D) O POS   I-stat Chem 8, ED     Status: Abnormal   Collection Time: 03/16/15  9:40 AM  Result Value Ref Range   Sodium 140 135 - 145 mmol/L   Potassium 3.2 (L) 3.5 - 5.1 mmol/L   Chloride 102 101 - 111 mmol/L   BUN 8 6 - 20 mg/dL   Creatinine, Ser 1.10 0.61 - 1.24 mg/dL   Glucose, Bld 171 (H) 65 - 99 mg/dL    Calcium, Ion 1.09 (L) 1.13 - 1.30 mmol/L   TCO2 25 0 - 100 mmol/L   Hemoglobin 8.2 (L) 13.0 - 17.0 g/dL   HCT 24.0 (L) 39.0 - 52.0 %  Prepare RBC     Status: None   Collection Time: 03/16/15 10:02 AM  Result Value Ref Range   Order Confirmation ORDER PROCESSED BY BLOOD BANK   Prepare RBC     Status: None   Collection Time: 03/16/15  2:10 PM  Result Value Ref Range   Order Confirmation ORDER PROCESSED BY BLOOD BANK   CBC     Status: Abnormal   Collection Time: 03/16/15  8:03 PM  Result Value Ref Range   WBC 10.8 (H) 4.0 - 10.5 K/uL   RBC 2.81 (L) 4.22 - 5.81 MIL/uL   Hemoglobin 8.3 (L) 13.0 - 17.0 g/dL   HCT 24.8 (L) 39.0 - 52.0 %   MCV 88.3 78.0 - 100.0 fL   MCH 29.5 26.0 - 34.0 pg   MCHC 33.5 30.0 - 36.0 g/dL   RDW 16.5 (H) 11.5 - 15.5 %   Platelets 117 (L) 150 - 400 K/uL    Comment: SPECIMEN CHECKED FOR CLOTS REPEATED TO VERIFY PLATELET COUNT CONFIRMED BY SMEAR LARGE PLATELETS PRESENT   Magnesium     Status: Abnormal   Collection Time: 03/16/15  8:03 PM  Result Value Ref Range   Magnesium 1.6 (L) 1.7 - 2.4 mg/dL  TSH     Status: None   Collection Time: 03/16/15  8:03 PM  Result Value Ref Range   TSH 1.148 0.350 - 4.500 uIU/mL  Protime-INR     Status: Abnormal   Collection Time: 03/16/15  8:04 PM  Result Value Ref Range   Prothrombin Time 15.8 (H) 11.6 - 15.2 seconds   INR 1.24 0.00 - 1.49  Protime-INR     Status: Abnormal   Collection Time: 03/17/15  1:51 AM  Result Value Ref Range   Prothrombin Time 15.4 (H) 11.6 - 15.2 seconds   INR 1.21 0.00 - 1.49  MRSA PCR Screening     Status: Abnormal   Collection Time: 03/17/15  4:15 AM  Result Value Ref Range   MRSA by PCR POSITIVE (A) NEGATIVE    Comment:        The GeneXpert MRSA Assay (FDA approved for NASAL specimens only), is one component of a comprehensive MRSA colonization surveillance program. It is not intended to diagnose MRSA infection nor to guide or monitor treatment for MRSA infections. RESULT  CALLED TO, READ BACK BY AND VERIFIED WITH: THIGPEN,J @ 0956 ON [id number] BY POTEAT,S   CBC     Status: Abnormal   Collection Time: 03/17/15  6:09 AM  Result Value Ref Range   WBC 9.3 4.0 - 10.5 K/uL   RBC 2.57 (L) 4.22 - 5.81 MIL/uL   Hemoglobin 7.5 (L) 13.0 - 17.0 g/dL   HCT 22.3 (L) 39.0 - 52.0 %   MCV 86.8 78.0 - 100.0 fL   MCH 29.2 26.0 - 34.0 pg   MCHC 33.6 30.0 - 36.0 g/dL   RDW 16.8 (H) 11.5 - 15.5 %   Platelets 108 (L) 150 - 400 K/uL    Comment: CONSISTENT WITH PREVIOUS RESULT  Comprehensive metabolic panel     Status: Abnormal   Collection Time: 03/17/15  6:09 AM  Result Value Ref Range   Sodium 137 135 - 145 mmol/L   Potassium 3.6 3.5 - 5.1 mmol/L   Chloride 108 101 - 111 mmol/L   CO2 22 22 - 32 mmol/L   Glucose, Bld 100 (H) 65 - 99 mg/dL   BUN 19 6 - 20 mg/dL   Creatinine, Ser 1.06 0.61 - 1.24 mg/dL   Calcium 7.3 (L) 8.9 - 10.3 mg/dL   Total Protein 5.0 (L) 6.5 - 8.1 g/dL   Albumin 2.3 (L) 3.5 - 5.0 g/dL   AST 24 15 - 41 U/L   ALT 30 17 - 63 U/L   Alkaline Phosphatase 82 38 - 126 U/L   Total Bilirubin 0.5 0.3 - 1.2 mg/dL   GFR calc non Af Amer >60 >60 mL/min   GFR calc Af Amer >60 >60 mL/min    Comment: (NOTE) The eGFR has been calculated using the CKD EPI equation. This calculation has not been validated in all clinical situations. eGFR's persistently <60 mL/min signify possible Chronic Kidney Disease.    Anion gap 7 5 - 15  Protime-INR     Status: Abnormal   Collection Time: 03/17/15  8:06 AM  Result Value Ref Range   Prothrombin Time 15.3 (H) 11.6 - 15.2 seconds   INR 1.19 0.00 - 1.49  Urinalysis, Routine w reflex microscopic (not at [facility])     Status: Abnormal   Collection Time: 03/17/15  9:55 AM  Result Value Ref Range   Color, Urine ORANGE (A) YELLOW    Comment: BIOCHEMICALS MAY BE AFFECTED BY COLOR   APPearance TURBID (A) CLEAR   Specific Gravity, Urine 1.013 1.005 - 1.030   pH 6.0 5.0 - 8.0   Glucose, UA NEGATIVE NEGATIVE mg/dL   Hgb urine  dipstick MODERATE (A) NEGATIVE   Bilirubin Urine NEGATIVE NEGATIVE   Ketones, ur NEGATIVE NEGATIVE mg/dL   Protein, ur NEGATIVE NEGATIVE mg/dL   Nitrite POSITIVE (A) NEGATIVE   Leukocytes, UA LARGE (A) NEGATIVE  Urine microscopic-add on     Status: Abnormal   Collection Time: 03/17/15  9:55 AM  Result Value Ref Range   Squamous Epithelial / LPF 0-5 (A) NONE SEEN   WBC, UA TOO NUMEROUS TO COUNT 0 - 5 WBC/hpf   RBC / HPF 6-30 0 - 5 RBC/hpf   Bacteria, UA FEW (A) NONE SEEN  Prepare RBC     Status: None   Collection Time: 03/17/15 10:00 AM  Result Value Ref Range   Order Confirmation ORDER PROCESSED BY BLOOD BANK    Recent Results (from the past 240 hour(s))  MRSA PCR Screening     Status: Abnormal   Collection Time: 03/17/15  4:15 AM  Result Value Ref Range Status   MRSA by PCR POSITIVE (A) NEGATIVE Final    Comment:        The GeneXpert MRSA Assay (  FDA approved for NASAL specimens only), is one component of a comprehensive MRSA colonization surveillance program. It is not intended to diagnose MRSA infection nor to guide or monitor treatment for MRSA infections. RESULT CALLED TO, READ BACK BY AND VERIFIED WITH: THIGPEN,J @ 0956 ON [id number] BY POTEAT,S    Creatinine:  Recent Labs  03/16/15 0933 03/16/15 0940 03/17/15 0609  CREATININE 1.29* 1.10 1.06   Baseline Creatinine:   Impression/Assessment:  Patient has an apparent history of difficult anterior urethral stricture disease.  He has had a Foley catheter indwelling for several months.  He potentially will be able to void successfully on his own but there is a high likelihood of development of recurrent stricture although it may not occur over several weeks to months.  Urine should be obtained for culture given the chronic indwelling Foley.  Normally we do not treat bacterial colonization with a chronic catheter but the catheter remains out that it would be appropriate to treat him.  Nursing staff needs to keep a close eye  on his post void residual.  I have asked them to do a bladder scan now since he recently voided as a baseline.  If the patient does develop recurrent urinary retention hopefully he will reconsider his refusal for catheter reinsertion.  Plan:  As above.  We'll notify Dr. Louis Meckel of the patient's admission for further monitoring.  Dannon Perlow S 03/17/2015, 12:51 PM

## 2015-03-17 NOTE — Progress Notes (Addendum)
[facility] PROGRESS NOTE  Brian Burns NWG:[id number] DOB: [birth year] DOA: 03/16/2015 PCP: Shirlee Latch, MD  Length of stay: 1   Assessment/Plan: Active Problems:   Hyperlipidemia   BPH (benign prostatic hyperplasia)   Weight loss, unintentional   GI bleed   Hypokalemia   Hyperglycemia   History of atrial fibrillation   Chronic kidney disease (CKD)   Depression   Hematemesis   GI bleeding   Gastrointestinal bleeding   BRIEF SUMMARY 70 year old male with a history of hypertension, dyslipidemia, achalasia, gastroesophageal reflux, atrial fibrillation on Coumadin, recent NG tube placement 2 by Dr. [name] with dislodgment, status post EGD and empiric dilatation. scheduled to have a manometry next week, presents 2/16 with 4 episodes of hematemesis. Patient found to be tachycardic in the 130s and hypotensive upon arrival. INR 1.89.EGD by Dr Madilyn Fireman showed  actively bleedingulcer versus AVMjust distal to theGE junction,tamponaded with 3 endoclips.Patient started on a Protonix drip,   transfused with 3 units of   blood. Being transferred to [facility] to the teaching service.Patient has also received one dose of vitamin K and kcentra for ongoing bleeding.   Assessment and plan GI bleed with hypotension. Upper bleeding insetting of anticoagulation. Presents with hematemesis. Rule out [employer] tear, PUD.  -Admitted to stepdown -PPI drip -coumadin reversal in process now though INR subtherapeutic EGD shows actively bleedingulcer versus AVM just distal to the GE junction,tamponaded with 3 endoclips s/p 2 units of blood, ordered one more unit -monitor CBC    Anemia of acute blood loss. Hgb down from 11.9 to 8.2>7.5 after 2 units , transfuse one more unit today -monitor CBC,    Hypokalemia. K+ 3.2.    Mg level 1.6   Urinary retention Dr Isabel Caprice to see patient and replace foley today  Chronic kidney disease (CKD), stage 3-4. -Cr at baseline  History of  atrial fibrillation. Initially tachycardic but could been from upper GI bleed. Heart rate now controlled. -Monitor on telemetry.  Weight loss, unintentional and recent abnormal swallowing studies raising suspicion for achalasia.   followed by gastroenterology    Hyperlipidemia -Continue statin when taking by mouth  BPH  -Continue home Proscar , Flomas when taking by mouth  Depression. -continue home Celexa when taking by mouth  Possible chronic pancreatitis by CTscan (calcifications).  -continue home creon      DVT prophylaxsis coumadin on hold   Code Status:      Code Status Orders     DNR    Start     Ordered     Family Communication: Discussed in detail with the patient, all imaging results, lab results explained to the patient   Disposition Plan:   Transfer to [facility] teaching service     Consultants:  Dr Madilyn Fireman   Procedures:  ENDOSCOPIC IMPRESSION: actively bleedingulcer versus AVMjust distal to theGE  junction,tamponaded with 3 endoclips  Antibiotics: Anti-infectives    None         HPI/Subjective: No further bleeding overnight , had a formed brown bowel movement this morning   Objective: Filed Vitals:   03/17/15 0600 03/17/15 0700 03/17/15 0800 03/17/15 0900  BP: 118/84 139/74 127/78 140/84  Pulse: 62 54 59 72  Temp:   98 F (36.7 C)   TempSrc:   Oral   Resp: 10 12 13 14   Height:      Weight:      SpO2: 99% 99% 99% 100%    Intake/Output Summary (Last 24 hours)  at 03/17/15 1027 Last data filed at 03/17/15 0900  Gross per 24 hour  Intake   1475 ml  Output   1101 ml  Net    374 ml    Exam:  General: No acute respiratory distress Lungs: Clear to auscultation bilaterally without wheezes or crackles Cardiovascular: Regular rate and rhythm without murmur gallop or rub normal S1 and S2 Abdomen: Nontender, nondistended, soft, bowel sounds positive, no rebound, no ascites, no appreciable mass Extremities: No significant cyanosis,  clubbing, or edema bilateral lower extremities     Data Review   Micro Results Recent Results (from the past 240 hour(s))  MRSA PCR Screening     Status: Abnormal   Collection Time: 03/17/15  4:15 AM  Result Value Ref Range Status   MRSA by PCR POSITIVE (A) NEGATIVE Final    Comment:        The GeneXpert MRSA Assay (FDA approved for NASAL specimens only), is one component of a comprehensive MRSA colonization surveillance program. It is not intended to diagnose MRSA infection nor to guide or monitor treatment for MRSA infections. RESULT CALLED TO, READ BACK BY AND VERIFIED WITH: THIGPEN,J @ 0956 ON [id number] BY POTEAT,S     Radiology Reports Dg Chest Portable 1 View  03/16/2015  CLINICAL DATA:  Hematemesis. EXAM: PORTABLE CHEST 1 VIEW COMPARISON:  02/13/2015 FINDINGS: The heart size and mediastinal contours are within normal limits. Both lungs are clear. Interval clearing of bilateral infiltrates. The visualized skeletal structures are unremarkable. IMPRESSION: No active disease. Electronically Signed   By: Marlan Palau M.D.   On: 03/16/2015 10:22     CBC  Recent Labs Lab 03/16/15 0933 03/16/15 0940 03/16/15 2003 03/17/15 0609  WBC 9.7  --  10.8* 9.3  HGB 7.4* 8.2* 8.3* 7.5*  HCT 22.7* 24.0* 24.8* 22.3*  PLT 155  --  117* 108*  MCV 88.3  --  88.3 86.8  MCH 28.8  --  29.5 29.2  MCHC 32.6  --  33.5 33.6  RDW 17.5*  --  16.5* 16.8*  LYMPHSABS 2.5  --   --   --   MONOABS 0.9  --   --   --   EOSABS 0.1  --   --   --   BASOSABS 0.0  --   --   --     Chemistries   Recent Labs Lab 03/16/15 0933 03/16/15 0940 03/16/15 2003 03/17/15 0609  NA 137 140  --  137  K 3.1* 3.2*  --  3.6  CL 105 102  --  108  CO2 24  --   --  22  GLUCOSE 178* 171*  --  100*  BUN 10 8  --  19  CREATININE 1.29* 1.10  --  1.06  CALCIUM 7.8*  --   --  7.3*  MG  --   --  1.6*  --   AST 33  --   --  24  ALT 39  --   --  30  ALKPHOS 109  --   --  82  BILITOT 0.4  --   --  0.5    ------------------------------------------------------------------------------------------------------------------ estimated creatinine clearance is 77 mL/min (by C-G formula based on Cr of 1.06). ------------------------------------------------------------------------------------------------------------------ No results for input(s): HGBA1C in the last 72 hours. ------------------------------------------------------------------------------------------------------------------ No results for input(s): CHOL, HDL, LDLCALC, TRIG, CHOLHDL, LDLDIRECT in the last 72 hours. ------------------------------------------------------------------------------------------------------------------  Recent Labs  03/16/15 2003  TSH 1.148   ------------------------------------------------------------------------------------------------------------------ No results for input(s): VITAMINB12,  FOLATE, FERRITIN, TIBC, IRON, RETICCTPCT in the last 72 hours.  Coagulation profile  Recent Labs Lab 03/16/15 0933 03/16/15 2004 03/17/15 0151 03/17/15 0806  INR 1.89* 1.24 1.21 1.19    No results for input(s): DDIMER in the last 72 hours.  Cardiac Enzymes No results for input(s): CKMB, TROPONINI, MYOGLOBIN in the last 168 hours.  Invalid input(s): CK ------------------------------------------------------------------------------------------------------------------ Invalid input(s): POCBNP   CBG: No results for input(s): GLUCAP in the last 168 hours.     Studies: Dg Chest Portable 1 View  03/16/2015  CLINICAL DATA:  Hematemesis. EXAM: PORTABLE CHEST 1 VIEW COMPARISON:  02/13/2015 FINDINGS: The heart size and mediastinal contours are within normal limits. Both lungs are clear. Interval clearing of bilateral infiltrates. The visualized skeletal structures are unremarkable. IMPRESSION: No active disease. Electronically Signed   By: Marlan Palau M.D.   On: 03/16/2015 10:22      Lab Results  Component  Value Date   HGBA1C 6.8* 01/15/2015   HGBA1C 6.4 03/18/2014   HGBA1C 6.7 12/02/2013   Lab Results  Component Value Date   LDLCALC 89 09/14/2013   CREATININE 1.06 03/17/2015       Scheduled Meds: . sodium chloride   Intravenous Once  . atorvastatin  40 mg Oral q1800  . citalopram  20 mg Oral q1800  . finasteride  5 mg Oral Daily  . gabapentin  100 mg Oral TID  . lipase/protease/amylase  12,000 Units Oral TID WC  . [START ON 03/19/2015] pantoprazole (PROTONIX) IV  40 mg Intravenous Q12H  . phenazopyridine  200 mg Oral TID WC  . sodium chloride flush  3 mL Intravenous Q12H  . sodium chloride flush  3 mL Intravenous Q12H  . tamsulosin  0.4 mg Oral Daily   Continuous Infusions: . 0.9 % NaCl with KCl 40 mEq / L 75 mL/hr (03/16/15 2133)  . pantoprozole (PROTONIX) infusion 8 mg/hr (03/17/15 0836)    Active Problems:   Hyperlipidemia   BPH (benign prostatic hyperplasia)   Weight loss, unintentional   GI bleed   Hypokalemia   Hyperglycemia   History of atrial fibrillation   Chronic kidney disease (CKD)   Depression   Hematemesis   GI bleeding   Gastrointestinal bleeding    Time spent: 45 minutes   [facility]  [facility] Pager [phone]. If 7PM-7AM, please contact night-coverage at [email or web address], password [facility] 03/17/2015, 10:27 AM  LOS: 1 day

## 2015-03-17 NOTE — Telephone Encounter (Signed)
Needs referral to [facility] Dr Marlou Porch for f/u foley catherer.  Appt is Monday feb 20 at 2pm

## 2015-03-17 NOTE — Progress Notes (Signed)
Pt urinary catheter to be changed this AM. After taking out old catheter, new one could not be put in.  MD notified and asked about urology consult to put in new catheter.

## 2015-03-17 NOTE — Progress Notes (Signed)
Report given to RN (2C) at [facility]. Patient awaiting Care link Transport.

## 2015-03-17 NOTE — Progress Notes (Signed)
[facility] Progress Note  Subjective: Patient was no more vomiting, no abdominal pain and states he had a stool early this morning not sure the appearance.  Objective: Vital signs in last 24 hours: Temp:  [97.9 F (36.6 Burns)-98.5 F (36.9 Burns)] 98 F (36.7 Burns) (02/17 1221) Pulse Rate:  [53-90] 61 (02/17 1221) Resp:  [10-23] 14 (02/17 1221) BP: (102-140)/(69-92) 122/80 mmHg (02/17 1221) SpO2:  [94 %-100 %] 100 % (02/17 1221) Weight:  [81.6 kg (179 lb 14.3 oz)-81.647 kg (180 lb)] 81.6 kg (179 lb 14.3 oz) (02/17 0400) Weight change:    PE: Alert oriented no acute distress  Lab Results: Results for orders placed or performed during the hospital encounter of 03/16/15 (from the past 24 hour(s))  Prepare RBC     Status: None   Collection Time: 03/16/15  2:10 PM  Result Value Ref Range   Order Confirmation ORDER PROCESSED BY BLOOD BANK   CBC     Status: Abnormal   Collection Time: 03/16/15  8:03 PM  Result Value Ref Range   WBC 10.8 (H) 4.0 - 10.5 K/uL   RBC 2.81 (L) 4.22 - 5.81 MIL/uL   Hemoglobin 8.3 (L) 13.0 - 17.0 g/dL   HCT 16.1 (L) 09.6 - 04.5 %   MCV 88.3 78.0 - 100.0 fL   MCH 29.5 26.0 - 34.0 pg   MCHC 33.5 30.0 - 36.0 g/dL   RDW 40.9 (H) 81.1 - 91.4 %   Platelets 117 (L) 150 - 400 K/uL  Magnesium     Status: Abnormal   Collection Time: 03/16/15  8:03 PM  Result Value Ref Range   Magnesium 1.6 (L) 1.7 - 2.4 mg/dL  TSH     Status: None   Collection Time: 03/16/15  8:03 PM  Result Value Ref Range   TSH 1.148 0.350 - 4.500 uIU/mL  Protime-INR     Status: Abnormal   Collection Time: 03/16/15  8:04 PM  Result Value Ref Range   Prothrombin Time 15.8 (H) 11.6 - 15.2 seconds   INR 1.24 0.00 - 1.49  Protime-INR     Status: Abnormal   Collection Time: 03/17/15  1:51 AM  Result Value Ref Range   Prothrombin Time 15.4 (H) 11.6 - 15.2 seconds   INR 1.21 0.00 - 1.49  MRSA PCR Screening     Status: Abnormal   Collection Time: 03/17/15  4:15 AM  Result Value Ref  Range   MRSA by PCR POSITIVE (A) NEGATIVE  CBC     Status: Abnormal   Collection Time: 03/17/15  6:09 AM  Result Value Ref Range   WBC 9.3 4.0 - 10.5 K/uL   RBC 2.57 (L) 4.22 - 5.81 MIL/uL   Hemoglobin 7.5 (L) 13.0 - 17.0 g/dL   HCT 78.2 (L) 95.6 - 21.3 %   MCV 86.8 78.0 - 100.0 fL   MCH 29.2 26.0 - 34.0 pg   MCHC 33.6 30.0 - 36.0 g/dL   RDW 08.6 (H) 57.8 - 46.9 %   Platelets 108 (L) 150 - 400 K/uL  Comprehensive metabolic panel     Status: Abnormal   Collection Time: 03/17/15  6:09 AM  Result Value Ref Range   Sodium 137 135 - 145 mmol/L   Potassium 3.6 3.5 - 5.1 mmol/L   Chloride 108 101 - 111 mmol/L   CO2 22 22 - 32 mmol/L   Glucose, Bld 100 (H) 65 - 99 mg/dL   BUN 19 6 - 20 mg/dL   Creatinine,  Ser 1.06 0.61 - 1.24 mg/dL   Calcium 7.3 (L) 8.9 - 10.3 mg/dL   Total Protein 5.0 (L) 6.5 - 8.1 g/dL   Albumin 2.3 (L) 3.5 - 5.0 g/dL   AST 24 15 - 41 U/L   ALT 30 17 - 63 U/L   Alkaline Phosphatase 82 38 - 126 U/L   Total Bilirubin 0.5 0.3 - 1.2 mg/dL   GFR calc non Af Amer >60 >60 mL/min   GFR calc Af Amer >60 >60 mL/min   Anion gap 7 5 - 15  Protime-INR     Status: Abnormal   Collection Time: 03/17/15  8:06 AM  Result Value Ref Range   Prothrombin Time 15.3 (H) 11.6 - 15.2 seconds   INR 1.19 0.00 - 1.49  Urinalysis, Routine w reflex microscopic (not at [facility])     Status: Abnormal   Collection Time: 03/17/15  9:55 AM  Result Value Ref Range   Color, Urine ORANGE (A) YELLOW   APPearance TURBID (A) CLEAR   Specific Gravity, Urine 1.013 1.005 - 1.030   pH 6.0 5.0 - 8.0   Glucose, UA NEGATIVE NEGATIVE mg/dL   Hgb urine dipstick MODERATE (A) NEGATIVE   Bilirubin Urine NEGATIVE NEGATIVE   Ketones, ur NEGATIVE NEGATIVE mg/dL   Protein, ur NEGATIVE NEGATIVE mg/dL   Nitrite POSITIVE (A) NEGATIVE   Leukocytes, UA LARGE (A) NEGATIVE  Urine microscopic-add on     Status: Abnormal   Collection Time: 03/17/15  9:55 AM  Result Value Ref Range   Squamous Epithelial / LPF 0-5 (A)  NONE SEEN   WBC, UA TOO NUMEROUS TO COUNT 0 - 5 WBC/hpf   RBC / HPF 6-30 0 - 5 RBC/hpf   Bacteria, UA FEW (A) NONE SEEN  Prepare RBC     Status: None   Collection Time: 03/17/15 10:00 AM  Result Value Ref Range   Order Confirmation ORDER PROCESSED BY BLOOD BANK     Studies/Results: Dg Chest Portable 1 View  03/16/2015  CLINICAL DATA:  Hematemesis. EXAM: PORTABLE CHEST 1 VIEW COMPARISON:  02/13/2015 FINDINGS: The heart size and mediastinal contours are within normal limits. Both lungs are clear. Interval clearing of bilateral infiltrates. The visualized skeletal structures are unremarkable. IMPRESSION: No active disease. Electronically Signed   By: Marlan Palau M.D.   On: 03/16/2015 10:22      Assessment: GI bleed from proximal gastric ulcer versus AVM versus dieulafoy lesion, status post Endo Clip 3  Plan: Into to monitor stools and hemoglobin, continue PPI, will advance to full liquid diet. Continue to follow.    Brian Burns 03/17/2015, 1:04 PM  Pager [phone] If no answer or after 5 PM call [phone]

## 2015-03-17 NOTE — Care Management Note (Signed)
Case Management Note  Patient Details  Name: EKAMJOT BONIFACE MRN: [id number] Date of Birth: 05/16/1945  Subjective/Objective:       Gi bleed             Action/Plan:  Patient transferring to [facility] via Care link   Expected Discharge Date:   (UNKNOWN)               Expected Discharge Plan:  Acute to Acute Transfer  In-House Referral:     Discharge planning Services  CM Consult  Post Acute Care Choice:  NA Choice offered to:  NA  DME Arranged:    DME Agency:     HH Arranged:    HH Agency:     Status of Service:  Completed, signed off  Medicare Important Message Given:    Date Medicare IM Given:    Medicare IM give by:    Date Additional Medicare IM Given:    Additional Medicare Important Message give by:     If discussed at Long Length of Stay Meetings, dates discussed:    Additional Comments:  Golda Acre, RN 03/17/2015, 11:30 AM

## 2015-03-17 NOTE — Progress Notes (Signed)
Patient is having small amount of copious sputum with hemoptysis. Sputum is in pink basin. Will continue to monitor patient.

## 2015-03-17 NOTE — Progress Notes (Addendum)
Interim Progress Note  S: patient admitted for GI bleed. EGD this afternoon s/p clip x3. [facility] consulted for management. Urology consulted for stricture and foley. He has received a total of 3u PRBC. Daughter worried about a red eye. Patient states unsure of how it formed but eye is non-painful  O: BP 110/67 mmHg  Pulse 51  Temp(Src) 97.4 F (36.3 C) (Oral)  Resp 16  Ht 6\' 4"  (1.93 m)  Wt 181 lb (82.101 kg)  BMI 22.04 kg/m2  SpO2 97%  General: well appearing Eyes: Right eye with subconjunctival hemorrhage located inferiorly CVS: Regular rate and rhythm Abdomen: soft, non-tender, non-distended, no rebound  A/P: GI bleed s/p clip x3 and 3u PRBC per notes, last received blood this afternoon - continue PPI - vitals - will need to consider restarting warfarin Afib since s/p EGD - continue IVF, decrease as tolerates diet - continue diet - repeat CBC this evening - CBC in AM - INR in AM  Urinary retention secondary to stricture s/p foley (now removed) - follow-up urology recs, Dr. Marlou Porch to follow from last urology note  Subconjunctival hemorrhage - noted, watch for worsening symptoms  Jacquelin Hawking, MD PGY-3, [facility] 03/17/2015, 11:10 PM

## 2015-03-18 LAB — CBC
HCT: 23.9 % — ABNORMAL LOW (ref 39.0–52.0)
HEMATOCRIT: 25.5 % — AB (ref 39.0–52.0)
HEMOGLOBIN: 7.8 g/dL — AB (ref 13.0–17.0)
Hemoglobin: 8.3 g/dL — ABNORMAL LOW (ref 13.0–17.0)
MCH: 28.1 pg (ref 26.0–34.0)
MCH: 28.1 pg (ref 26.0–34.0)
MCHC: 32.5 g/dL (ref 30.0–36.0)
MCHC: 32.6 g/dL (ref 30.0–36.0)
MCV: 86 fL (ref 78.0–100.0)
MCV: 86.4 fL (ref 78.0–100.0)
PLATELETS: 111 10*3/uL — AB (ref 150–400)
Platelets: 110 10*3/uL — ABNORMAL LOW (ref 150–400)
RBC: 2.78 MIL/uL — AB (ref 4.22–5.81)
RBC: 2.95 MIL/uL — ABNORMAL LOW (ref 4.22–5.81)
RDW: 16.4 % — ABNORMAL HIGH (ref 11.5–15.5)
RDW: 16.7 % — AB (ref 11.5–15.5)
WBC: 6.4 10*3/uL (ref 4.0–10.5)
WBC: 6.7 10*3/uL (ref 4.0–10.5)

## 2015-03-18 LAB — BASIC METABOLIC PANEL
ANION GAP: 6 (ref 5–15)
BUN: 12 mg/dL (ref 6–20)
CHLORIDE: 110 mmol/L (ref 101–111)
CO2: 24 mmol/L (ref 22–32)
Calcium: 7.9 mg/dL — ABNORMAL LOW (ref 8.9–10.3)
Creatinine, Ser: 1.01 mg/dL (ref 0.61–1.24)
GFR calc Af Amer: >60 mL/min (ref >60)
GFR calc non Af Amer: >60 mL/min (ref >60)
GLUCOSE: 103 mg/dL — AB (ref 65–99)
POTASSIUM: 4.3 mmol/L (ref 3.5–5.1)
Sodium: 140 mmol/L (ref 135–145)

## 2015-03-18 LAB — MAGNESIUM: MAGNESIUM: 2 mg/dL (ref 1.7–2.4)

## 2015-03-18 LAB — PROTIME-INR
INR: 1.24 (ref 0.00–1.49)
Prothrombin Time: 15.7 seconds — ABNORMAL HIGH (ref 11.6–15.2)

## 2015-03-18 MED ORDER — PANTOPRAZOLE SODIUM 40 MG PO TBEC
40.0000 mg | DELAYED_RELEASE_TABLET | Freq: Two times a day (BID) | ORAL | Status: DC
  Administered 2015-03-18 – 2015-03-22 (×9): 40 mg via ORAL
  Filled 2015-03-18 (×9): qty 1

## 2015-03-18 NOTE — Progress Notes (Signed)
  Subjective: Patient reports that he continues to void well. No hematuria. Hxs of anterior urethral stricture post dilation per Dr. Marlou Porch. Refused dilation last night per Dr. Isabel Caprice.   Objective: Vital signs in last 24 hours: Temp:  [97.1 F (36.2 C)-98.5 F (36.9 C)] 97.6 F (36.4 C) (02/18 1200) Pulse Rate:  [43-64] 49 (02/18 1200) Resp:  [0-17] 16 (02/18 1200) BP: (109-145)/(55-81) 114/68 mmHg (02/18 1200) SpO2:  [95 %-100 %] 100 % (02/18 1200) Weight:  [82.101 kg (181 lb)] 82.101 kg (181 lb) (02/17 2306)A  Intake/Output from previous day: 02/17 0701 - 02/18 0700 In: 2721 [P.O.:210; I.V.:2176; Blood:335] Out: 2276 [Urine:2275; Stool:1] Intake/Output this shift: Total I/O In: 190 [P.O.:90; I.V.:100] Out: 550 [Urine:550]  Past Medical History  Diagnosis Date  . Hypertension   . HLD (hyperlipidemia)   . Erectile dysfunction   . Chronic back pain   . CAD (coronary artery disease)   . GERD (gastroesophageal reflux disease)   . Arthritis     knees,lower back  . Carotid artery disease (HCC)   . PVD (peripheral vascular disease) with claudication (HCC)   . AAA (abdominal aortic aneurysm) without rupture (HCC) 06/2014    no change in last exam from 2015  . History of CVA (cerebrovascular accident)     2009  &  2013  . Type 2 diabetes mellitus (HCC)     no meds per pt  . Atrial flutter with rapid ventricular response (HCC) 01/16/2015  . Atrial fibrillation (HCC)   . Idiopathic pancreatitis 12/2014    acute    Physical Exam:  Lungs - Normal respiratory effort, chest expands symmetrically.  Abdomen - Soft, non-tender & non-distended.  Lab Results:  Recent Labs  03/17/15 0609 03/18/15 0004 03/18/15 0309  WBC 9.3 6.7 6.4  HGB 7.5* 7.8* 8.3*  HCT 22.3* 23.9* 25.5*   BMET  Recent Labs  03/17/15 0609 03/18/15 0309  NA 137 140  K 3.6 4.3  CL 108 110  CO2 22 24  GLUCOSE 100* 103*  BUN 19 12  CREATININE 1.06 1.01  CALCIUM 7.3* 7.9*   No results for  input(s): LABURIN in the last 72 hours. Results for orders placed or performed during the hospital encounter of 03/16/15  MRSA PCR Screening     Status: Abnormal   Collection Time: 03/17/15  4:15 AM  Result Value Ref Range Status   MRSA by PCR POSITIVE (A) NEGATIVE Final    Comment:        The GeneXpert MRSA Assay (FDA approved for NASAL specimens only), is one component of a comprehensive MRSA colonization surveillance program. It is not intended to diagnose MRSA infection nor to guide or monitor treatment for MRSA infections. RESULT CALLED TO, READ BACK BY AND VERIFIED WITH: THIGPEN,J @ 0956 ON [id number] BY POTEAT,S   Urine culture     Status: None (Preliminary result)   Collection Time: 03/17/15  1:42 PM  Result Value Ref Range Status   Specimen Description URINE, CLEAN CATCH  Final   Special Requests NONE  Final   Culture   Final    TOO YOUNG TO READ Performed at [facility]    Report Status PENDING  Incomplete    Studies/Results: No results found.  Assessment: Stable , voiding clear urine.  Plan: No further Rx from urology.  Darrin Koman I Palmina Clodfelter 03/18/2015, 2:17 PM

## 2015-03-18 NOTE — Progress Notes (Signed)
[facility] Progress Note  Subjective: The patient is doing well and he has not noticed any further bleeding.  Objective: Vital signs in last 24 hours: Temp:  [97.1 F (36.2 C)-98.5 F (36.9 C)] 97.6 F (36.4 C) (02/18 1200) Pulse Rate:  [43-64] 49 (02/18 1200) Resp:  [0-17] 16 (02/18 1200) BP: (109-145)/(55-81) 114/68 mmHg (02/18 1200) SpO2:  [95 %-100 %] 100 % (02/18 1200) Weight:  [82.101 kg (181 lb)] 82.101 kg (181 lb) (02/17 2306) Weight change: 0.454 kg (1 lb)   PE:  No distress  Heart regular rhythm  Lungs clear  Abdomen soft and nontender  Lab Results: Results for orders placed or performed during the hospital encounter of 03/16/15 (from the past 24 hour(s))  Urine culture     Status: None (Preliminary result)   Collection Time: 03/17/15  1:42 PM  Result Value Ref Range   Specimen Description URINE, CLEAN CATCH    Special Requests NONE    Culture      TOO YOUNG TO READ Performed at [facility]    Report Status PENDING   Protime-INR     Status: Abnormal   Collection Time: 03/17/15  2:58 PM  Result Value Ref Range   Prothrombin Time 15.5 (H) 11.6 - 15.2 seconds   INR 1.22 0.00 - 1.49  CBC     Status: Abnormal   Collection Time: 03/18/15 12:04 AM  Result Value Ref Range   WBC 6.7 4.0 - 10.5 K/uL   RBC 2.78 (L) 4.22 - 5.81 MIL/uL   Hemoglobin 7.8 (L) 13.0 - 17.0 g/dL   HCT 02.7 (L) 74.1 - 28.7 %   MCV 86.0 78.0 - 100.0 fL   MCH 28.1 26.0 - 34.0 pg   MCHC 32.6 30.0 - 36.0 g/dL   RDW 86.7 (H) 67.2 - 09.4 %   Platelets 110 (L) 150 - 400 K/uL  CBC     Status: Abnormal   Collection Time: 03/18/15  3:09 AM  Result Value Ref Range   WBC 6.4 4.0 - 10.5 K/uL   RBC 2.95 (L) 4.22 - 5.81 MIL/uL   Hemoglobin 8.3 (L) 13.0 - 17.0 g/dL   HCT 70.9 (L) 62.8 - 36.6 %   MCV 86.4 78.0 - 100.0 fL   MCH 28.1 26.0 - 34.0 pg   MCHC 32.5 30.0 - 36.0 g/dL   RDW 29.4 (H) 76.5 - 46.5 %   Platelets 111 (L) 150 - 400 K/uL  Protime-INR     Status: Abnormal    Collection Time: 03/18/15  3:09 AM  Result Value Ref Range   Prothrombin Time 15.7 (H) 11.6 - 15.2 seconds   INR 1.24 0.00 - 1.49  Magnesium     Status: None   Collection Time: 03/18/15  3:09 AM  Result Value Ref Range   Magnesium 2.0 1.7 - 2.4 mg/dL  Basic metabolic panel     Status: Abnormal   Collection Time: 03/18/15  3:09 AM  Result Value Ref Range   Sodium 140 135 - 145 mmol/L   Potassium 4.3 3.5 - 5.1 mmol/L   Chloride 110 101 - 111 mmol/L   CO2 24 22 - 32 mmol/L   Glucose, Bld 103 (H) 65 - 99 mg/dL   BUN 12 6 - 20 mg/dL   Creatinine, Ser 0.35 0.61 - 1.24 mg/dL   Calcium 7.9 (L) 8.9 - 10.3 mg/dL   GFR calc non Af Amer >60 >60 mL/min   GFR calc Af Amer >60 >60 mL/min  Anion gap 6 5 - 15    Studies/Results: No results found.    Assessment: Upper GI bleed.  Plan:   Continue medical management. i would still hold off on anticoagulation for now.    Brian Burns 03/18/2015, 12:31 PM  Pager: [phone] If no answer or after 5 PM call [phone]

## 2015-03-18 NOTE — Evaluation (Signed)
Physical Therapy Evaluation Patient Details Name: Brian Burns MRN: [id number] DOB: [birth year] Today's Date: 03/18/2015   History of Present Illness  70 year old male with a history of hypertension, dyslipidemia, achalasia, gastroesophageal reflux, atrial fibrillation on Coumadin, recent NG tube placement 2 by Dr. [name] with dislodgment, status post EGD and empiric dilatation. scheduled to have a manometry next week, presents today with 4 episodes of hematemesis.   Clinical Impression  Pt presenting with generalized weakness, decreased endurance, and impaired balance requiring use of RW and assist for safe ambulation. Pt excellent candidate for CIR as he is motivated to return to indep. Pt does have supportive wife at home to provide 24/7 supervision. Anticipate pt could achieve safe supervision level of function in 7-10 days of CIR upon d/c for safe transition home with spouse.    Follow Up Recommendations CIR    Equipment Recommendations  Rolling walker with 5" wheels    Recommendations for Other Services Rehab consult     Precautions / Restrictions Precautions Precautions: Fall Restrictions Weight Bearing Restrictions: No      Mobility  Bed Mobility Overal bed mobility: Needs Assistance Bed Mobility: Supine to Sit     Supine to sit: Min assist     General bed mobility comments: assist for trunk elevation  Transfers Overall transfer level: Needs assistance Equipment used: Rolling walker (2 wheeled) Transfers: Sit to/from Stand Sit to Stand: Min assist         General transfer comment: v/c's for hand placement, increased time, pushed with LEs back onto bed to brace self  Ambulation/Gait Ambulation/Gait assistance: Min assist Ambulation Distance (Feet): 100 Feet Assistive device: Rolling walker (2 wheeled) Gait Pattern/deviations: Step-through pattern;Narrow base of support;Staggering left Gait velocity: decreased   General Gait Details: pt with difficulty  clearing L foot, lean to R but vearing L, pt unsteady requring minA to maintain balance  Stairs            Wheelchair Mobility    Modified Rankin (Stroke Patients Only)       Balance Overall balance assessment: Needs assistance   Sitting balance-Leahy Scale: Good     Standing balance support: Bilateral upper extremity supported Standing balance-Leahy Scale: Poor                               Pertinent Vitals/Pain Pain Assessment: No/denies pain    Home Living Family/patient expects to be discharged to:: Skilled nursing facility                 Additional Comments: pt was at [facility] before would like to go back there, does live with wife in 1 story home    Prior Function Level of Independence:  (was at SNF working with PT)               Hand Dominance   Dominant Hand: Right    Extremity/Trunk Assessment   Upper Extremity Assessment: RUE deficits/detail RUE Deficits / Details: grossly 3/5, shoulder 2/5         Lower Extremity Assessment: Generalized weakness      Cervical / Trunk Assessment: Normal  Communication   Communication: Expressive difficulties  Cognition Arousal/Alertness: Awake/alert Behavior During Therapy: WFL for tasks assessed/performed Overall Cognitive Status: Within Functional Limits for tasks assessed                      General Comments      Exercises  Assessment/Plan    PT Assessment Patient needs continued PT services  PT Diagnosis Difficulty walking;Generalized weakness   PT Problem List Decreased strength;Decreased activity tolerance;Decreased balance;Decreased mobility;Decreased coordination;Decreased knowledge of use of DME  PT Treatment Interventions DME instruction;Gait training;Stair training;Functional mobility training;Therapeutic activities;Therapeutic exercise;Balance training;Neuromuscular re-education;Patient/family education   PT Goals (Current goals can be  found in the Care Plan section) Acute Rehab PT Goals Patient Stated Goal: to get stronger PT Goal Formulation: With patient Time For Goal Achievement: 04/01/15 Potential to Achieve Goals: Good    Frequency Min 3X/week   Barriers to discharge        Co-evaluation               End of Session Equipment Utilized During Treatment: Gait belt Activity Tolerance: Patient tolerated treatment well Patient left: in chair;with call bell/phone within reach Nurse Communication: Mobility status         Time: 7628-3151 PT Time Calculation (min) (ACUTE ONLY): 22 min   Charges:   PT Evaluation $PT Eval Moderate Complexity: 1 Procedure     PT G CodesMarcene Brawn 03/18/2015, 4:35 PM   Lewis Shock, PT, DPT Pager #: [phone] Office #: [phone]

## 2015-03-18 NOTE — Progress Notes (Signed)
Family Medicine Teaching Service Daily Progress Note Intern Pager: [phone]  Patient name: Brian Burns Medical record number: [id number] Date of birth: 03-Mar-1945 Age: 70 y.o. Gender: male  Primary Care Provider: Shirlee Latch, MD Consultants: GI, urology Code Status: DNR  Pt Overview and Major Events to Date:  2/16 - admitted for hematemesis 2/17 - EGD with clipping x3, transfused 3u pRBCs  Assessment and Plan: 70 yo male with h/o afib, CAD, CKD, achalasia, presented with hematemesis.  GI bleed s/p clip x3 and 3u PRBC per notes, last received blood this afternoon - continue PPI - awaiting GI approval to restart anticoagulation for afib - continue IVF, decrease as tolerates diet - continue diet - repeat CBC this evening - CBC in AM - INR in AM - PT consulted for [facility] recs, patient eager to go home given recent SNF admission  Urinary retention secondary to stricture s/p foley (now removed) - follow-up urology recs, Dr. Marlou Porch to follow from last urology note  Subconjunctival hemorrhage - noted, watch for worsening symptoms or vision change  FEN/GI: Full liquid, NS@75  PPx: SCDs, restart warfarin when ok with GI  Disposition: home with family and HH when cleared by GI  Subjective:  Feeling well, NAEON, denies pain, nausea, two nonbloody stools yesterday per patient.  Objective: Temp:  [97.1 F (36.2 C)-98.5 F (36.9 C)] 97.1 F (36.2 C) (02/18 0400) Pulse Rate:  [43-72] 50 (02/18 0600) Resp:  [0-17] 10 (02/18 0600) BP: (109-145)/(55-92) 114/64 mmHg (02/18 0600) SpO2:  [95 %-100 %] 97 % (02/18 0600) Weight:  [181 lb (82.101 kg)] 181 lb (82.101 kg) (02/17 2306) Physical Exam: General: well appearing Eyes: Right eye with subconjunctival hemorrhage located inferiorly CVS: Regular rate and rhythm Abdomen: soft, non-tender, non-distended, no rebound Extremities: [facility]  Laboratory:  Recent Labs Lab 03/17/15 0609 03/18/15 0004 03/18/15 0309  WBC 9.3 6.7 6.4   HGB 7.5* 7.8* 8.3*  HCT 22.3* 23.9* 25.5*  PLT 108* 110* 111*    Recent Labs Lab 03/16/15 0933 03/16/15 0940 03/17/15 0609 03/18/15 0309  NA 137 140 137 140  K 3.1* 3.2* 3.6 4.3  CL 105 102 108 110  CO2 24  --  22 24  BUN 10 8 19 12   CREATININE 1.29* 1.10 1.06 1.01  CALCIUM 7.8*  --  7.3* 7.9*  PROT 6.0*  --  5.0*  --   BILITOT 0.4  --  0.5  --   ALKPHOS 109  --  82  --   ALT 39  --  30  --   AST 33  --  24  --   GLUCOSE 178* 171* 100* 103*    03/16/2015 09:33 03/16/2015 20:04 03/17/2015 01:51 03/17/2015 08:06 03/17/2015 14:58 03/18/2015 03:09  Prothrombin Time 21.6 (H) 15.8 (H) 15.4 (H) 15.3 (H) 15.5 (H) 15.7 (H)  INR 1.89 (H) 1.24 1.21 1.19 1.22 1.24     Imaging/Diagnostic Tests: Dg Chest Portable 1 View  03/16/2015  CLINICAL DATA:  Hematemesis. EXAM: PORTABLE CHEST 1 VIEW COMPARISON:  02/13/2015 FINDINGS: The heart size and mediastinal contours are within normal limits. Both lungs are clear. Interval clearing of bilateral infiltrates. The visualized skeletal structures are unremarkable. IMPRESSION: No active disease. Electronically Signed   By: Marlan Palau M.D.   On: 03/16/2015 10:22    Abram Sander, MD 03/18/2015, 8:25 AM PGY-3, [facility] FPTS Intern pager: [phone], text pages welcome

## 2015-03-19 DIAGNOSIS — N35919 Unspecified urethral stricture, male, unspecified site: Secondary | ICD-10-CM | POA: Insufficient documentation

## 2015-03-19 DIAGNOSIS — K922 Gastrointestinal hemorrhage, unspecified: Secondary | ICD-10-CM | POA: Insufficient documentation

## 2015-03-19 LAB — CBC
HEMATOCRIT: 23.1 % — AB (ref 39.0–52.0)
HEMOGLOBIN: 7.5 g/dL — AB (ref 13.0–17.0)
MCH: 28.8 pg (ref 26.0–34.0)
MCHC: 32.5 g/dL (ref 30.0–36.0)
MCV: 88.8 fL (ref 78.0–100.0)
Platelets: 108 10*3/uL — ABNORMAL LOW (ref 150–400)
RBC: 2.6 MIL/uL — ABNORMAL LOW (ref 4.22–5.81)
RDW: 16.8 % — ABNORMAL HIGH (ref 11.5–15.5)
WBC: 5.5 10*3/uL (ref 4.0–10.5)

## 2015-03-19 LAB — CULTURE, ROUTINE-GENITAL

## 2015-03-19 MED ORDER — ACETAMINOPHEN 325 MG PO TABS
650.0000 mg | ORAL_TABLET | Freq: Four times a day (QID) | ORAL | Status: DC | PRN
  Administered 2015-03-19 – 2015-03-22 (×2): 650 mg via ORAL
  Filled 2015-03-19 (×2): qty 2

## 2015-03-19 MED ORDER — WARFARIN SODIUM 7.5 MG PO TABS
7.5000 mg | ORAL_TABLET | Freq: Once | ORAL | Status: AC
  Administered 2015-03-19: 7.5 mg via ORAL
  Filled 2015-03-19: qty 1
  Filled 2015-03-19: qty 1.5

## 2015-03-19 MED ORDER — WARFARIN - PHARMACIST DOSING INPATIENT
Freq: Every day | Status: DC
Start: 2015-03-19 — End: 2015-03-22
  Administered 2015-03-20 – 2015-03-21 (×2)

## 2015-03-19 NOTE — Progress Notes (Signed)
Pt transferred per chair to 5 west bed 06 with all belongings by NT

## 2015-03-19 NOTE — Progress Notes (Signed)
ANTICOAGULATION CONSULT NOTE - Initial Consult  Pharmacy Consult for Coumadin Indication: atrial fibrillation  Allergies  Allergen Reactions  . Novocain [Procaine Hcl] Nausea And Vomiting  . Amlodipine     On MAR  . Aspirin     On MAR  . Atorvastatin     On MAR  . Calcium Carbonate     On MAR  . Doxycycline     On MAR    Patient Measurements: Height: 6\' 4"  (193 cm) Weight: 181 lb (82.101 kg) IBW/kg (Calculated) : 86.8  Vital Signs: Temp: 97.7 F (36.5 C) (02/19 0740) Temp Source: Oral (02/19 0740) BP: 116/69 mmHg (02/19 0600) Pulse Rate: 43 (02/19 0740)  Labs:  Recent Labs  03/17/15 0609 03/17/15 0806 03/17/15 1458 03/18/15 0004 03/18/15 0309 03/19/15 0339  HGB 7.5*  --   --  7.8* 8.3* 7.5*  HCT 22.3*  --   --  23.9* 25.5* 23.1*  PLT 108*  --   --  110* 111* 108*  LABPROT  --  15.3* 15.5*  --  15.7*  --   INR  --  1.19 1.22  --  1.24  --   CREATININE 1.06  --   --   --  1.01  --     Estimated Creatinine Clearance: 81.3 mL/min (by C-G formula based on Cr of 1.01).   Medical History: Past Medical History  Diagnosis Date  . Hypertension   . HLD (hyperlipidemia)   . Erectile dysfunction   . Chronic back pain   . CAD (coronary artery disease)   . GERD (gastroesophageal reflux disease)   . Arthritis     knees,lower back  . Carotid artery disease (HCC)   . PVD (peripheral vascular disease) with claudication (HCC)   . AAA (abdominal aortic aneurysm) without rupture (HCC) 06/2014    no change in last exam from 2015  . History of CVA (cerebrovascular accident)     2009  &  2013  . Type 2 diabetes mellitus (HCC)     no meds per pt  . Atrial flutter with rapid ventricular response (HCC) 01/16/2015  . Atrial fibrillation (HCC)   . Idiopathic pancreatitis 12/2014    acute    Assessment: 70 yo male on warfarin 7.5mg  daily for afib. INR was 1.89 on admission. Presents from NH with hematemesis at least 3 times, having vagal episodes in ED and continued  bloody emesis. Found to have upper GI bleed. Now looks like bleeding has stopped and is eating fine. Pharmacy consulted to restart coumadin. Hgb low but stable at 7.5, plts 108.  Goal of Therapy:  INR 2-3 Monitor platelets by anticoagulation protocol: Yes   Plan:  Give coumadin 7.5mg  PO x 1 tonight Monitor daily INR, CBC, s/s of bleed  Enzo Bi, PharmD, BCPS Clinical Pharmacist Pager [phone] 03/19/2015 11:19 AM

## 2015-03-19 NOTE — Progress Notes (Signed)
Report called to 5 Chad . Spoke to [employer]. Pt will transfer per chair to 5 West bed 6 Wife aware of pending transfer earlier - called & updated with new room per phone.

## 2015-03-19 NOTE — Progress Notes (Signed)
Brian Burns [id number] Admission Data: 03/19/2015 6:22 PM Attending Provider: Moses Manners, MD  OMB:TDHRCB Beryle Flock, MD Consults/ Treatment Team: Treatment Team:  Dorena Cookey, MD Crist Fat, MD  Brian Burns is a 70 y.o. male patient [facility] from [facility] awake, alert  & orientated  X 3,  DNR, VSS - Blood pressure 128/71, pulse 49, temperature 97.8 F (36.6 C), temperature source Axillary, resp. rate 12, height 6\' 4"  (1.93 m), weight 82.101 kg (181 lb), SpO2 100 %., Room Air, no c/o shortness of breath, no c/o chest pain, no distress noted. Tele # [id number] placed    IV site WDL:  Right AC and Left Hand with a transparent dsg that's clean dry and intact.  Allergies:   Allergies  Allergen Reactions  . Novocain [Procaine Hcl] Nausea And Vomiting  . Amlodipine     On MAR  . Aspirin     On MAR  . Atorvastatin     On MAR  . Calcium Carbonate     On MAR  . Doxycycline     On MAR     Past Medical History  Diagnosis Date  . Hypertension   . HLD (hyperlipidemia)   . Erectile dysfunction   . Chronic back pain   . CAD (coronary artery disease)   . GERD (gastroesophageal reflux disease)   . Arthritis     knees,lower back  . Carotid artery disease (HCC)   . PVD (peripheral vascular disease) with claudication (HCC)   . AAA (abdominal aortic aneurysm) without rupture (HCC) 06/2014    no change in last exam from 2015  . History of CVA (cerebrovascular accident)     2009  &  2013  . Type 2 diabetes mellitus (HCC)     no meds per pt  . Atrial flutter with rapid ventricular response (HCC) 01/16/2015  . Atrial fibrillation (HCC)   . Idiopathic pancreatitis 12/2014    acute   Pt orientation to unit, room and routine. Fall risk assessment complete with Patient verbalizing understanding of risks associated with falls. Pt verbalizes an understanding of how to use the call bell and to call for help before getting out of bed.  Skin, clean-dry- intact without evidence of bruising, or  skin tears.   No evidence of skin break down noted on exam. RNs were not able to assess sacrum at this time. Pt un in chair eating breakfast. Will pass this on in report to NIght shift RN. Skin assessment done with Jacquiline Doe, RN.   Will cont to monitor and assist as needed.  Tobin Chad, RN 03/19/2015 6:38 PM

## 2015-03-19 NOTE — Progress Notes (Signed)
[facility] Progress Note  Subjective: The patient feels fine. He is eating. He is hungry. He has no abdominal pain. He denies any signs of any further GI bleeding.  Objective: Vital signs in last 24 hours: Temp:  [97.6 F (36.4 C)-98.1 F (36.7 C)] 97.7 F (36.5 C) (02/19 0740) Pulse Rate:  [43-78] 43 (02/19 0740) Resp:  [10-23] 11 (02/19 0740) BP: (109-139)/(59-86) 116/69 mmHg (02/19 0600) SpO2:  [94 %-100 %] 97 % (02/19 0740) Weight change:    PE:  He is in no distress  Heart regular rhythm  Lungs clear  Abdomen soft and nontender  Lab Results: Results for orders placed or performed during the hospital encounter of 03/16/15 (from the past 24 hour(s))  CBC     Status: Abnormal   Collection Time: 03/19/15  3:39 AM  Result Value Ref Range   WBC 5.5 4.0 - 10.5 K/uL   RBC 2.60 (L) 4.22 - 5.81 MIL/uL   Hemoglobin 7.5 (L) 13.0 - 17.0 g/dL   HCT 74.1 (L) 28.7 - 86.7 %   MCV 88.8 78.0 - 100.0 fL   MCH 28.8 26.0 - 34.0 pg   MCHC 32.5 30.0 - 36.0 g/dL   RDW 67.2 (H) 09.4 - 70.9 %   Platelets 108 (L) 150 - 400 K/uL    Studies/Results: No results found.    Assessment: Upper GI bleed secondary to either an ulcer or AVM just distal to the GE junction. Hemostasis was achieved a couple of days ago by endoscopic clipping.  No evidence of further active bleeding.  Plan:   Continue PPI therapy. We will sign off, call us if needed.  In regards to resuming his anticoagulation it appears that he was on Coumadin because of atrial fibrillation and a prior stroke. I think it would be reasonable to resume his anticoagulation to try and prevent further embolization. Resuming it this close to a recent GI bleed runs the risk of his having further bleeding, however holding off on it runs the risk of further embolization and another stroke. There are no clear-cut guidelines in regards to this matter. Timing of resumption of anticoagulation post bleeding depends on the  individual patient's risk of embolization.    Gwenevere Abbot 03/19/2015, 9:58 AM  Pager: [phone] If no answer or after 5 PM call [phone] Lab Results  Component Value Date   HGB 7.5* 03/19/2015   HGB 8.3* 03/18/2015   HGB 7.8* 03/18/2015   HCT 23.1* 03/19/2015   HCT 25.5* 03/18/2015   HCT 23.9* 03/18/2015   ALKPHOS 82 03/17/2015   ALKPHOS 109 03/16/2015   ALKPHOS 102 01/28/2015   AST 24 03/17/2015   AST 33 03/16/2015   AST 27 01/28/2015   ALT 30 03/17/2015   ALT 39 03/16/2015   ALT 20 01/28/2015

## 2015-03-19 NOTE — Plan of Care (Signed)
Problem: Education: Goal: Ability to identify signs and symptoms of gastrointestinal bleeding will improve Outcome: Progressing Discussed ambulation and rehab for unsteady gait

## 2015-03-19 NOTE — Progress Notes (Signed)
Rehab Admissions Coordinator Note:  Patient was screened by Clois Dupes for appropriateness for an Inpatient Acute Rehab Consult per PT recommendation. At this time, we will complete rehab consult on Monday. Noted [employer] will have to approve any rehab venue.  Clois Dupes 03/19/2015, 9:42 AM  I can be reached at [phone].

## 2015-03-19 NOTE — Progress Notes (Signed)
Family Medicine Teaching Service Daily Progress Note Intern Pager: [phone]  Patient name: Brian Burns Medical record number: [id number] Date of birth: [birth year] Age: 70 y.o. Gender: male  Primary Care Provider: Shirlee Latch, MD Consultants: GI, urology Code Status: DNR  Pt Overview and Major Events to Date:  2/16 - admitted for hematemesis 2/17 - EGD with clipping x3, transfused 3u pRBCs  Assessment and Plan: 70 yo male with h/o afib, CAD, CKD, achalasia, presented with hematemesis.  GI bleed s/p clip x3 and 3u PRBC, hgb stable - continue PPI - awaiting GI approval to restart anticoagulation for afib - d/c IVF, taking good PO - continue diet - CBC in AM - INR in AM - PT consulted for [facility] recs, patient eager to go home given recent SNF admission  Urinary retention secondary to stricture s/p foley (now removed) - Uro signed off 2/18, will need outpatient f/u  Subconjunctival hemorrhage - noted, watch for worsening symptoms or vision change  FEN/GI: regular, SLIV PPx: SCDs, restart warfarin when ok with GI  Disposition: CIR referral pending  Subjective:  Feeling well, NAEON, denies pain, nausea, no bloody in stool per patient.  Objective: Temp:  [97.6 F (36.4 C)-98.1 F (36.7 C)] 97.7 F (36.5 C) (02/19 0740) Pulse Rate:  [43-78] 43 (02/19 0740) Resp:  [10-23] 11 (02/19 0740) BP: (109-139)/(59-86) 116/69 mmHg (02/19 0600) SpO2:  [94 %-100 %] 97 % (02/19 0740) Physical Exam: General: well appearing, sitting up in bed eating breakfast Eyes: Right eye with subconjunctival hemorrhage located inferiorly CVS: Regular rate and rhythm Abdomen: soft, non-tender, non-distended, no rebound Extremities: [facility]  Laboratory:  Recent Labs Lab 03/18/15 0004 03/18/15 0309 03/19/15 0339  WBC 6.7 6.4 5.5  HGB 7.8* 8.3* 7.5*  HCT 23.9* 25.5* 23.1*  PLT 110* 111* 108*    Recent Labs Lab 03/16/15 0933 03/16/15 0940 03/17/15 0609 03/18/15 0309  NA 137 140 137  140  K 3.1* 3.2* 3.6 4.3  CL 105 102 108 110  CO2 24  --  22 24  BUN 10 8 19 12   CREATININE 1.29* 1.10 1.06 1.01  CALCIUM 7.8*  --  7.3* 7.9*  PROT 6.0*  --  5.0*  --   BILITOT 0.4  --  0.5  --   ALKPHOS 109  --  82  --   ALT 39  --  30  --   AST 33  --  24  --   GLUCOSE 178* 171* 100* 103*    03/16/2015 09:33 03/16/2015 20:04 03/17/2015 01:51 03/17/2015 08:06 03/17/2015 14:58 03/18/2015 03:09  Prothrombin Time 21.6 (H) 15.8 (H) 15.4 (H) 15.3 (H) 15.5 (H) 15.7 (H)  INR 1.89 (H) 1.24 1.21 1.19 1.22 1.24     Imaging/Diagnostic Tests: Dg Chest Portable 1 View  03/16/2015  CLINICAL DATA:  Hematemesis. EXAM: PORTABLE CHEST 1 VIEW COMPARISON:  02/13/2015 FINDINGS: The heart size and mediastinal contours are within normal limits. Both lungs are clear. Interval clearing of bilateral infiltrates. The visualized skeletal structures are unremarkable. IMPRESSION: No active disease. Electronically Signed   By: Marlan Palau M.D.   On: 03/16/2015 10:22    Abram Sander, MD 03/19/2015, 7:56 AM PGY-3, [facility] FPTS Intern pager: [phone], text pages welcome

## 2015-03-19 NOTE — Telephone Encounter (Signed)
Patient is currently hospitalized.  Inpatient team to place referral on discharge.  Patient unlikely to be discharged in time to make appt tomorrow.  Erasmo Downer, MD, MPH PGY-2,  [facility] 03/19/2015 7:52 AM

## 2015-03-19 NOTE — Progress Notes (Signed)
PCP Social Note.  Saw the patient in the hospital.  He is started to do better. Appreciate great care provided by inpatient service.  Please refer to there progress note for more information and contact information.  Erasmo Downer, MD, MPH PGY-2,  [facility] 03/19/2015 1:50 PM

## 2015-03-20 DIAGNOSIS — I10 Essential (primary) hypertension: Secondary | ICD-10-CM | POA: Insufficient documentation

## 2015-03-20 DIAGNOSIS — D62 Acute posthemorrhagic anemia: Secondary | ICD-10-CM | POA: Insufficient documentation

## 2015-03-20 DIAGNOSIS — I6930 Unspecified sequelae of cerebral infarction: Secondary | ICD-10-CM

## 2015-03-20 DIAGNOSIS — I480 Paroxysmal atrial fibrillation: Secondary | ICD-10-CM

## 2015-03-20 DIAGNOSIS — Z8679 Personal history of other diseases of the circulatory system: Secondary | ICD-10-CM

## 2015-03-20 DIAGNOSIS — I2510 Atherosclerotic heart disease of native coronary artery without angina pectoris: Secondary | ICD-10-CM

## 2015-03-20 DIAGNOSIS — K254 Chronic or unspecified gastric ulcer with hemorrhage: Principal | ICD-10-CM

## 2015-03-20 DIAGNOSIS — D696 Thrombocytopenia, unspecified: Secondary | ICD-10-CM | POA: Insufficient documentation

## 2015-03-20 DIAGNOSIS — K220 Achalasia of cardia: Secondary | ICD-10-CM

## 2015-03-20 DIAGNOSIS — E119 Type 2 diabetes mellitus without complications: Secondary | ICD-10-CM

## 2015-03-20 DIAGNOSIS — Z8673 Personal history of transient ischemic attack (TIA), and cerebral infarction without residual deficits: Secondary | ICD-10-CM

## 2015-03-20 LAB — PROTIME-INR
INR: 1.27 (ref 0.00–1.49)
PROTHROMBIN TIME: 16.1 s — AB (ref 11.6–15.2)

## 2015-03-20 LAB — CBC
HEMATOCRIT: 23.5 % — AB (ref 39.0–52.0)
Hemoglobin: 7.6 g/dL — ABNORMAL LOW (ref 13.0–17.0)
MCH: 28.9 pg (ref 26.0–34.0)
MCHC: 32.3 g/dL (ref 30.0–36.0)
MCV: 89.4 fL (ref 78.0–100.0)
PLATELETS: 122 10*3/uL — AB (ref 150–400)
RBC: 2.63 MIL/uL — ABNORMAL LOW (ref 4.22–5.81)
RDW: 16.8 % — AB (ref 11.5–15.5)
WBC: 5.4 10*3/uL (ref 4.0–10.5)

## 2015-03-20 LAB — URINE CULTURE: Culture: >=100000

## 2015-03-20 LAB — TYPE AND SCREEN
ABO/RH(D): O POS
ANTIBODY SCREEN: NEGATIVE
UNIT DIVISION: 0
Unit division: 0
Unit division: 0
Unit division: 0

## 2015-03-20 LAB — GLUCOSE, CAPILLARY: Glucose-Capillary: 123 mg/dL — ABNORMAL HIGH (ref 65–99)

## 2015-03-20 MED ORDER — FERROUS SULFATE 325 (65 FE) MG PO TABS
325.0000 mg | ORAL_TABLET | Freq: Three times a day (TID) | ORAL | Status: DC
  Administered 2015-03-20 – 2015-03-22 (×5): 325 mg via ORAL
  Filled 2015-03-20 (×5): qty 1

## 2015-03-20 MED ORDER — VANCOMYCIN HCL IN DEXTROSE 1-5 GM/200ML-% IV SOLN
1000.0000 mg | Freq: Two times a day (BID) | INTRAVENOUS | Status: DC
  Administered 2015-03-21: 1000 mg via INTRAVENOUS
  Filled 2015-03-20 (×3): qty 200

## 2015-03-20 MED ORDER — WARFARIN SODIUM 5 MG PO TABS
10.0000 mg | ORAL_TABLET | Freq: Once | ORAL | Status: AC
  Administered 2015-03-20: 10 mg via ORAL
  Filled 2015-03-20: qty 2

## 2015-03-20 MED ORDER — VANCOMYCIN HCL 10 G IV SOLR
1500.0000 mg | Freq: Once | INTRAVENOUS | Status: AC
  Administered 2015-03-20: 1500 mg via INTRAVENOUS
  Filled 2015-03-20: qty 1500

## 2015-03-20 NOTE — Progress Notes (Signed)
Physical Therapy Treatment Patient Details Name: Brian Burns MRN: [id number] DOB: 18-Sep-1945 Today's Date: 03/20/2015    History of Present Illness 70 year old male with a history of hypertension, dyslipidemia, achalasia, gastroesophageal reflux, atrial fibrillation on Coumadin, recent NG tube placement 2 by Dr. [name] with dislodgment, status post EGD and empiric dilatation. scheduled to have a manometry next week, presents today with 4 episodes of hematemesis.     PT Comments    Very motivated and excellent particpation as well as gains in activity tolerance; Continue to recommend comprehensive inpatient rehab (CIR) for post-acute therapy needs.   Follow Up Recommendations  CIR     Equipment Recommendations  Rolling walker with 5" wheels    Recommendations for Other Services       Precautions / Restrictions Precautions Precautions: Fall    Mobility  Bed Mobility Overal bed mobility: Needs Assistance Bed Mobility: Supine to Sit   Sidelying to sit: Supervision   Sit to supine: Supervision   General bed mobility comments: increased time  Transfers Overall transfer level: Needs assistance Equipment used: Rolling walker (2 wheeled) Transfers: Sit to/from Stand Sit to Stand: Min guard         General transfer comment: v/c's for hand placement, increased time, pushed with LEs back onto bed to brace self  Ambulation/Gait Ambulation/Gait assistance: Min guard Ambulation Distance (Feet): 120 Feet Assistive device: Rolling walker (2 wheeled) Gait Pattern/deviations: Narrow base of support;Trunk flexed     General Gait Details: Cues to smooth out steps, add some space to his step width for greater stability   Stairs            Wheelchair Mobility    Modified Rankin (Stroke Patients Only)       Balance     Sitting balance-Leahy Scale: Good       Standing balance-Leahy Scale: Poor Standing balance comment: Worked on sanding balance at sink,  single limb stance R and L with prn UE suport on counter Single Leg Stance - Right Leg: 3 Single Leg Stance - Left Leg: 3                Cognition Arousal/Alertness: Awake/alert Behavior During Therapy: WFL for tasks assessed/performed Overall Cognitive Status: Within Functional Limits for tasks assessed                      Exercises      General Comments        Pertinent Vitals/Pain Pain Assessment: No/denies pain    Home Living                      Prior Function            PT Goals (current goals can now be found in the care plan section) Acute Rehab PT Goals Patient Stated Goal: to get stronger; wants to work on his balance PT Goal Formulation: With patient Time For Goal Achievement: 04/01/15 Potential to Achieve Goals: Good Progress towards PT goals: Progressing toward goals    Frequency  Min 3X/week    PT Plan Current plan remains appropriate    Co-evaluation             End of Session Equipment Utilized During Treatment: Gait belt Activity Tolerance: Patient tolerated treatment well Patient left: in bed;with call bell/phone within reach;with bed alarm set     Time: 0762-2633 PT Time Calculation (min) (ACUTE ONLY): 28 min  Charges:  $Gait Training: 23-37 mins  G Codes:      Brian Burns [facility] 03/20/2015, 4:40 PM  Brian Burns, [place]  Acute Rehabilitation Services Pager [phone] Office [phone]

## 2015-03-20 NOTE — Progress Notes (Signed)
CSW will continue to follow for potential SNF placement if pt is unable to go to CIR.  Anticipate pt will be stable for DC 2/21- [facility] (where pt came from) anticipates being able to accept patient back tomorrow if pt is needing SNF placement and pending [employer] Auth  CSW will continue to follow  Merlyn Lot, [facility] Clinical Social Worker [phone]

## 2015-03-20 NOTE — Progress Notes (Signed)
If patient is Dc'd to SNF, he states that he would be willing to go back to [facility].

## 2015-03-20 NOTE — Progress Notes (Signed)
Pharmacy Antibiotic Note  Brian Burns is a 70 y.o. male admitted on 03/16/2015 with UTI.  Pharmacy has been consulted for vancomycin dosing. Patient found to have MRSA growing in urine. Resistant to bactrim and cipro.  Afebrile, WBC wnl. SCr 1.01, CrCl ~69ml/min.  Plan: Give vancomycin 1.5g IV x 1, then start vancomycin 1g IV Q12 tomorrow (Goal trough 10-59mcg/mL) Monitor clinical picture, renal function, VT prn F/U LOT    Height: 6\' 4"  (193 cm) Weight: 181 lb (82.102 kg) IBW/kg (Calculated) : 86.8  Temp (24hrs), Avg:98.1 F (36.7 C), Min:97.5 F (36.4 C), Max:98.7 F (37.1 C)   Recent Labs Lab 03/16/15 0933 03/16/15 0940  03/17/15 0609 03/18/15 0004 03/18/15 0309 03/19/15 0339 03/20/15 0643  WBC 9.7  --   < > 9.3 6.7 6.4 5.5 5.4  CREATININE 1.29* 1.10  --  1.06  --  1.01  --   --   < > = values in this interval not displayed.  Estimated Creatinine Clearance: 81.3 mL/min (by C-G formula based on Cr of 1.01).    Allergies  Allergen Reactions  . Novocain [Procaine Hcl] Nausea And Vomiting  . Amlodipine     On MAR  . Aspirin     On MAR  . Atorvastatin     On MAR  . Calcium Carbonate     On MAR  . Doxycycline     On MAR    Antimicrobials this admission: Vancomycin 2/20 >>   Dose adjustments this admission: n/a  Microbiology results: Urine cx > MRSA  Thank you for allowing pharmacy to be a part of this patient's care.  Enzo Bi, PharmD, BCPS Clinical Pharmacist Pager [phone] 03/20/2015 5:17 PM

## 2015-03-20 NOTE — Progress Notes (Signed)
I have spoken by phone with RN CM and SW. I will begin insurance precert for a possible inpt rehab admission pending insurance approval. It will be tomorrow before I have insurance decision. [phone]

## 2015-03-20 NOTE — Progress Notes (Signed)
Family Medicine Teaching Service Daily Progress Note Intern Pager: [phone]  Patient name: Brian Burns Medical record number: [id number] Date of birth: March 31, 1945 Age: 70 y.o. Gender: male  Primary Care Provider: Shirlee Latch, MD Consultants: GI, urology Code Status: DNR  Pt Overview and Major Events to Date:  2/16 - admitted for hematemesis 2/17 - EGD with clipping x3, transfused 3u pRBCs  Assessment and Plan: 70 yo male with h/o afib, CAD, CKD, achalasia, presented with hematemesis, hx of CVA, and Afib  GI bleed s/p clip x3 in esphogaus and 3u PRBC, hgb stable, vitals stable  - continue PPI, sucralfate, and CREON - Continue Coumadin, started 2/19 due to past stroke and Afib, ok per GI,  - GI signed off  - PT consulted for possible CIR admit, awaiting determination  - Start 325 mg Iron TID   Hx of CVA, hx of atrial flutter: CHAD2VASC 5; HASBLED 3 - Continue warfarin, consider bridging; will discuss with patient  - INR 1.27 this AM  - No rate control as low HR (50s)  Urinary retention secondary to stricture s/p foley (now removed) - Uro signed off 2/18, will need outpatient f/u - Patient without issues with urination   Subconjunctival hemorrhage - noted, watch for worsening symptoms or vision change  FEN/GI: regular, SLIV PPx: SCDs, Warfarin  Disposition: CIR vs. Home   Subjective:  Patient without issues this morning. No SOB, no palpations, no n/v, or fever/chills   Objective: Temp:  [97.2 F (36.2 C)-98.5 F (36.9 C)] 98.5 F (36.9 C) (02/20 0624) Pulse Rate:  [44-85] 85 (02/20 0624) Resp:  [12-18] 18 (02/20 0624) BP: (110-128)/(61-73) 128/72 mmHg (02/20 0624) SpO2:  [97 %-100 %] 97 % (02/20 0624) Weight:  [181 lb (82.102 kg)] 181 lb (82.102 kg) (02/19 1734) Physical Exam: General: well appearing, sitting up in bed eating breakfast Lungs: CTAB, no wheezes, rhonchi  CVS: Regular rate and rhythm Abdomen: soft, non-tender, non-distended, no  rebound Extremities: [facility]  Laboratory:  Recent Labs Lab 03/18/15 0309 03/19/15 0339 03/20/15 0643  WBC 6.4 5.5 5.4  HGB 8.3* 7.5* 7.6*  HCT 25.5* 23.1* 23.5*  PLT 111* 108* 122*    Recent Labs Lab 03/16/15 0933 03/16/15 0940 03/17/15 0609 03/18/15 0309  NA 137 140 137 140  K 3.1* 3.2* 3.6 4.3  CL 105 102 108 110  CO2 24  --  22 24  BUN 10 8 19 12   CREATININE 1.29* 1.10 1.06 1.01  CALCIUM 7.8*  --  7.3* 7.9*  PROT 6.0*  --  5.0*  --   BILITOT 0.4  --  0.5  --   ALKPHOS 109  --  82  --   ALT 39  --  30  --   AST 33  --  24  --   GLUCOSE 178* 171* 100* 103*    Results for Brian Burns (MRN [id number]) as of 03/20/2015 09:11  Ref. Range 03/20/2015 06:43  Prothrombin Time Latest Ref Range: 11.6-15.2 seconds 16.1 (H)  INR Latest Ref Range: 0.00-1.49  1.27   Imaging/Diagnostic Tests: Dg Chest Portable 1 View  03/16/2015  CLINICAL DATA:  Hematemesis. EXAM: PORTABLE CHEST 1 VIEW COMPARISON:  02/13/2015 FINDINGS: The heart size and mediastinal contours are within normal limits. Both lungs are clear. Interval clearing of bilateral infiltrates. The visualized skeletal structures are unremarkable. IMPRESSION: No active disease. Electronically Signed   By: Marlan Palau M.D.   On: 03/16/2015 10:22    Brian Burns Brian Reel, MD 03/20/2015, 9:33 AM  PGY-1, [place] Intern pager: [phone], text pages welcome

## 2015-03-20 NOTE — Progress Notes (Signed)
ANTICOAGULATION CONSULT NOTE - Follow Up Consult  Pharmacy Consult for warfarin Indication: atrial fibrillation  Allergies  Allergen Reactions  . Novocain [Procaine Hcl] Nausea And Vomiting  . Amlodipine     On MAR  . Aspirin     On MAR  . Atorvastatin     On MAR  . Calcium Carbonate     On MAR  . Doxycycline     On MAR    Patient Measurements: Height: 6\' 4"  (193 cm) Weight: 181 lb (82.102 kg) IBW/kg (Calculated) : 86.8  Vital Signs: Temp: 98.5 F (36.9 C) (02/20 0624) Temp Source: Oral (02/20 0624) BP: 128/72 mmHg (02/20 0624) Pulse Rate: 85 (02/20 0624)  Labs:  Recent Labs  03/17/15 1458  03/18/15 0309 03/19/15 0339 03/20/15 0643  HGB  --   < > 8.3* 7.5* 7.6*  HCT  --   < > 25.5* 23.1* 23.5*  PLT  --   < > 111* 108* 122*  LABPROT 15.5*  --  15.7*  --  16.1*  INR 1.22  --  1.24  --  1.27  CREATININE  --   --  1.01  --   --   < > = values in this interval not displayed.  Estimated Creatinine Clearance: 81.3 mL/min (by C-G formula based on Cr of 1.01).  Assessment: 70 y/o male who presented with hematemesis found to have upper GI bleed and is s/p endoscopic clipping. He took warfarin PTA for Afib and hx stroke. KCentra and vitamin K given on 2/16 so may take a little longer to get INR therapeutic.  PTA regimen: 7.5 mg/d  Warfarin resumed 2/19. INR is subtherapeutic today at 1.27. No bleeding noted, Hb stable in 7's, platelets low but stable.  Goal of Therapy:  INR 2-3 Monitor platelets by anticoagulation protocol: Yes   Plan:  - Warfarin 10 mg PO tonight - INR daily - Monitor for s/sx of bleeding  [facility], [address].D., BCPS Clinical Pharmacist Pager: [phone] 03/20/2015 1:37 PM

## 2015-03-20 NOTE — Consult Note (Signed)
Physical Medicine and Rehabilitation Consult Reason for Consult: Debilitation/upper GI bleed/multi-medical Referring Physician: Family medicine   HPI: Brian Burns is a 70 y.o. right handed male with history of hypertension, CVA 2, achalasia, esophageal dilatation in January 2017 with no abnormalities found, atrial fibrillation with chronic Coumadin. Patient recently had [facility] skilled nursing facility since 02/11/2015 for reported generalized weakness and dysphagia. Was scheduled to be discharged to home with his wife. Presented 03/16/2015 with bright red hematemesis 4. Hemoglobin 8.2. Patient was tachycardic with hypotension. INR on admission of 1.9 and received vitamin K. Chest x-ray negative. Underwent endoscopy per Dr. Madilyn Fireman showing actively bleeding ulcer versus AVM just distal to the GE junction, tamponaded with 3 endoclips. Hemoglobin remained stable 7.5-8.3. Patient's chronic Coumadin resumed 03/19/2015. Hospital course urinary retention with history of urethral stricture inability to place Foley catheter and urology consulted. Patient refused dilatation. Foley tube since removed patient voided without difficulty. Physical therapy evaluation completed 03/18/2015 with recommendations of physical medicine rehabilitation consult. Pt has appointment with ENT 2/28 for evaluation of dysphagia.     Review of Systems  Constitutional: Negative for fever and chills.  HENT: Negative for hearing loss.   Respiratory: Positive for cough and shortness of breath.   Cardiovascular: Negative for chest pain.  Gastrointestinal: Positive for nausea, constipation and blood in stool. Negative for vomiting.       GERD  Genitourinary: Positive for urgency and frequency. Negative for dysuria.  Musculoskeletal: Positive for back pain.  Neurological: Positive for weakness. Negative for seizures, loss of consciousness and headaches.  Psychiatric/Behavioral: Positive for depression.  All other  systems reviewed and are negative.  Past Medical History  Diagnosis Date  . Hypertension   . HLD (hyperlipidemia)   . Erectile dysfunction   . Chronic back pain   . CAD (coronary artery disease)   . GERD (gastroesophageal reflux disease)   . Arthritis     knees,lower back  . Carotid artery disease (HCC)   . PVD (peripheral vascular disease) with claudication (HCC)   . AAA (abdominal aortic aneurysm) without rupture (HCC) 06/2014    no change in last exam from 2015  . History of CVA (cerebrovascular accident)     2009  &  2013  . Type 2 diabetes mellitus (HCC)     no meds per pt  . Atrial flutter with rapid ventricular response (HCC) 01/16/2015  . Atrial fibrillation (HCC)   . Idiopathic pancreatitis 12/2014    acute   Past Surgical History  Procedure Laterality Date  . Esophagogastroduodenoscopy N/A 05/15/2014    Procedure: ESOPHAGOGASTRODUODENOSCOPY (EGD);  Surgeon: Carman Ching, MD;  Location: [facility] ENDOSCOPY;  Service: Endoscopy;  Laterality: N/A;  . Cataract extraction w/ intraocular lens  implant, bilateral  april and may 2014  . Transthoracic echocardiogram  01-07-2012    mild to moderate dilated LV,  ef 45-50%,  mild basal hypokinesis,  grade I diastolic  dysfunction/  trivial AR/  mild MR  . Cardiovascular stress test  08-09-2009    mild global hypokinesis,  no ischemia or scar/  ef 41%  . Cystoscopy with urethral dilatation N/A 11/24/2014    Procedure: CYSTOSCOPY WITH URETHRAL DILATATION;  Surgeon: Crist Fat, MD;  Location: [facility];  Service: Urology;  Laterality: N/A;  BALLOON DILATION        . Cystoscopy with retrograde urethrogram N/A 11/24/2014    Procedure: CYSTOSCOPY WITH RETROGRADE URETHROGRAM;  Surgeon: Crist Fat, MD;  Location: [facility];  Service: Urology;  Laterality: N/A;  . Esophagogastroduodenoscopy Left 02/02/2015    Procedure: ESOPHAGOGASTRODUODENOSCOPY (EGD);  Surgeon: Dorena Cookey, MD;  Location: [facility]  ENDOSCOPY;  Service: Endoscopy;  Laterality: Left;  . Esophagogastroduodenoscopy Left 03/16/2015    Procedure: ESOPHAGOGASTRODUODENOSCOPY (EGD);  Surgeon: Dorena Cookey, MD;  Location: [facility] ENDOSCOPY;  Service: Endoscopy;  Laterality: Left;   Family History  Problem Relation Age of Onset  . Lung disease Brother   . Kidney disease Brother   . Heart disease Brother     died at 61  . Heart disease Mother   . Kidney disease Mother   . Lung disease Mother    Social History:  reports that he quit smoking about 11 years ago. His smoking use included Cigarettes. He has a 20 pack-year smoking history. He has never used smokeless tobacco. He reports that he does not drink alcohol or use illicit drugs. Allergies:  Allergies  Allergen Reactions  . Novocain [Procaine Hcl] Nausea And Vomiting  . Amlodipine     On MAR  . Aspirin     On MAR  . Atorvastatin     On MAR  . Calcium Carbonate     On MAR  . Doxycycline     On MAR   Medications Prior to Admission  Medication Sig Dispense Refill  . amLODipine (NORVASC) 10 MG tablet Take 1 tablet (10 mg total) by mouth daily. 90 tablet 1  . aspirin EC 81 MG tablet Take 81 mg by mouth daily.    Marland Kitchen atorvastatin (LIPITOR) 40 MG tablet Take 1 tablet (40 mg total) by mouth daily. 90 tablet 3  . citalopram (CELEXA) 20 MG tablet Take 20 mg by mouth daily at 6 PM.    . finasteride (PROSCAR) 5 MG tablet Take 1 tablet (5 mg total) by mouth daily. 30 tablet 1  . gabapentin (NEURONTIN) 100 MG capsule Take 1 capsule (100 mg total) by mouth 3 (three) times daily. 180 capsule 2  . lipase/protease/amylase (CREON) 12000 units CPEP capsule Take 1 capsule (12,000 Units total) by mouth 3 (three) times daily with meals. 270 capsule 0  . omeprazole (PRILOSEC) 20 MG capsule Take 1 capsule (20 mg total) by mouth daily. 30 capsule 0  . ondansetron (ZOFRAN) 4 MG tablet Take 1 tablet (4 mg total) by mouth every 6 (six) hours as needed for nausea or vomiting. 12 tablet 0  . OVER THE  COUNTER MEDICATION Take 120 mLs by mouth 4 (four) times daily - after meals and at bedtime. MEDPASS    . phenazopyridine (PYRIDIUM) 200 MG tablet Take 1 tablet (200 mg total) by mouth 3 (three) times daily as needed for pain. 10 tablet 0  . sucralfate (CARAFATE) 1 G tablet Take 1 tablet (1 g total) by mouth 3 (three) times daily as needed. (Patient taking differently: Take 1 g by mouth 3 (three) times daily as needed (Nausea/vomitting). ) 30 tablet 0  . tamsulosin (FLOMAX) 0.4 MG CAPS capsule Take 1 capsule (0.4 mg total) by mouth daily. 30 capsule 3  . warfarin (COUMADIN) 7.5 MG tablet Take 7.5 mg by mouth daily.      Home: Home Living Family/patient expects to be discharged to:: Skilled nursing facility Living Arrangements: Other (Comment) Additional Comments: pt was at [facility] before would like to go back there, does live with wife in 1 story home  Functional History: Prior Function Level of Independence:  (was at SNF working with PT) Functional Status:  Mobility: Bed Mobility Overal  bed mobility: Needs Assistance Bed Mobility: Supine to Sit Supine to sit: Min assist General bed mobility comments: assist for trunk elevation Transfers Overall transfer level: Needs assistance Equipment used: Rolling walker (2 wheeled) Transfers: Sit to/from Stand Sit to Stand: Min assist General transfer comment: v/c's for hand placement, increased time, pushed with LEs back onto bed to brace self Ambulation/Gait Ambulation/Gait assistance: Min assist Ambulation Distance (Feet): 100 Feet Assistive device: Rolling walker (2 wheeled) Gait Pattern/deviations: Step-through pattern, Narrow base of support, Staggering left General Gait Details: pt with difficulty clearing L foot, lean to R but vearing L, pt unsteady requring minA to maintain balance Gait velocity: decreased    ADL:    Cognition: Cognition Overall Cognitive Status: Within Functional Limits for tasks assessed Orientation  Level: Oriented X4 Cognition Arousal/Alertness: Awake/alert Behavior During Therapy: WFL for tasks assessed/performed Overall Cognitive Status: Within Functional Limits for tasks assessed  Blood pressure 120/73, pulse 55, temperature 97.5 F (36.4 C), temperature source Oral, resp. rate 18, height 6\' 4"  (1.93 m), weight 82.102 kg (181 lb), SpO2 97 %. Physical Exam  Vitals reviewed. Constitutional: He is oriented to person, place, and time. He appears well-developed and well-nourished.  HENT:  Head: Normocephalic and atraumatic.  Poor dentition  Eyes: Conjunctivae and EOM are normal.  Neck: Normal range of motion. Neck supple. No thyromegaly present.  Cardiovascular: Normal rate and regular rhythm.   Respiratory: Effort normal and breath sounds normal. No respiratory distress.  GI: Soft. Bowel sounds are normal. He exhibits no distension.  Musculoskeletal: He exhibits no edema or tenderness.  Neurological: He is alert and oriented to person, place, and time.  Limited medical historian.  Follows simple commands Speech stutter Motor: 4+/5 throughout Sensation intact to light touch  Skin: Skin is warm and dry.  Psychiatric: He has a normal mood and affect. His behavior is normal.    No results found for this or any previous visit (from the past 24 hour(s)). No results found.  Assessment/Plan: Diagnosis: Debilitation/upper GI bleed/multi-medical  Labs and images independently reviewed.  Records reviewed and summated above.  1. Does the need for close, 24 hr/day medical supervision in concert with the patient's rehab needs make it unreasonable for this patient to be served in a less intensive setting? Yes  2. Co-Morbidities requiring supervision/potential complications: HTN (monitor and provide prns in accordance with increased physical exertion and pain),CVA 2, achalasia (follow up by ENT), atrial fibrillation (cont meds, monitor with increased activity), DM (Monitor in accordance  with exercise and adjust meds as necessary), HTN (monitor and provide prns in accordance with increased physical exertion and pain), CAD (cont meds), ABLA (transfuse if necessary to ensure appropriate perfusion for increased activity tolerance), Thrombocytopenia (< 60,000/mm3 no resistive exercise 3. Due to bowel management, safety, skin/wound care, disease management, medication administration, pain management and patient education, does the patient require 24 hr/day rehab nursing? Yes 4. Does the patient require coordinated care of a physician, rehab nurse, PT (1-2 hrs/day, 5 days/week) and OT (1-2 hrs/day, 5 days/week) to address physical and functional deficits in the context of the above medical diagnosis(es)? Yes Addressing deficits in the following areas: balance, endurance, locomotion, strength, transferring, bathing, toileting and psychosocial support 5. Can the patient actively participate in an intensive therapy program of at least 3 hrs of therapy per day at least 5 days per week? Yes 6. The potential for patient to make measurable gains while on inpatient rehab is excellent 7. Anticipated functional outcomes upon discharge from inpatient rehab  are modified independent  with PT, modified independent with OT, n/a with SLP. 8. Estimated rehab length of stay to reach the above functional goals is: 8-11 days. 9. Does the patient have adequate social supports and living environment to accommodate these discharge functional goals? Yes 10. Anticipated D/C setting: Home 11. Anticipated post D/C treatments: HH therapy and Home excercise program 12. Overall Rehab/Functional Prognosis: excellent  RECOMMENDATIONS: This patient's condition is appropriate for continued rehabilitative care in the following setting: CIR Patient has agreed to participate in recommended program. Yes Note that insurance prior authorization may be required for reimbursement for recommended care.  Comment: Rehab Admissions  Coordinator to follow up.  Maryla Morrow, MD 03/20/2015

## 2015-03-20 NOTE — Progress Notes (Signed)
I met pt at bedside to discuss a possible inpt rehab admission pending [facility] approval which I have initiated. Pt prefers inpt rehab admission rather than return to SNF at this time. I also contacted his wife by phone to discuss rehab option. She is in agreement to inpt rehab if insurance will approve.I will follow up tomorrow. [phone]

## 2015-03-20 NOTE — Care Management Important Message (Signed)
Important Message  Patient Details  Name: JAVED JUHASZ MRN: [id number] Date of Birth: 10-09-1945   Medicare Important Message Given:  Yes    Lawerance Sabal, RN 03/20/2015, 9:31 AMImportant Message  Patient Details  Name: Brian Burns MRN: [id number] Date of Birth: 1945-06-18   Medicare Important Message Given:  Yes    Lawerance Sabal, RN 03/20/2015, 9:31 AM

## 2015-03-21 DIAGNOSIS — R110 Nausea: Secondary | ICD-10-CM

## 2015-03-21 DIAGNOSIS — K920 Hematemesis: Secondary | ICD-10-CM

## 2015-03-21 DIAGNOSIS — K922 Gastrointestinal hemorrhage, unspecified: Secondary | ICD-10-CM

## 2015-03-21 DIAGNOSIS — D649 Anemia, unspecified: Secondary | ICD-10-CM

## 2015-03-21 LAB — BASIC METABOLIC PANEL
ANION GAP: 6 (ref 5–15)
BUN: 10 mg/dL (ref 6–20)
CHLORIDE: 109 mmol/L (ref 101–111)
CO2: 23 mmol/L (ref 22–32)
Calcium: 8.1 mg/dL — ABNORMAL LOW (ref 8.9–10.3)
Creatinine, Ser: 1.23 mg/dL (ref 0.61–1.24)
GFR calc Af Amer: >60 mL/min (ref >60)
GFR, EST NON AFRICAN AMERICAN: 59 mL/min — AB (ref >60)
GLUCOSE: 137 mg/dL — AB (ref 65–99)
POTASSIUM: 3.7 mmol/L (ref 3.5–5.1)
Sodium: 138 mmol/L (ref 135–145)

## 2015-03-21 LAB — CBC
HEMATOCRIT: 23.2 % — AB (ref 39.0–52.0)
HEMOGLOBIN: 7.3 g/dL — AB (ref 13.0–17.0)
MCH: 27.9 pg (ref 26.0–34.0)
MCHC: 31.5 g/dL (ref 30.0–36.0)
MCV: 88.5 fL (ref 78.0–100.0)
Platelets: 147 10*3/uL — ABNORMAL LOW (ref 150–400)
RBC: 2.62 MIL/uL — ABNORMAL LOW (ref 4.22–5.81)
RDW: 16.9 % — ABNORMAL HIGH (ref 11.5–15.5)
WBC: 5.7 10*3/uL (ref 4.0–10.5)

## 2015-03-21 LAB — PROTIME-INR
INR: 1.3 (ref 0.00–1.49)
Prothrombin Time: 16.3 seconds — ABNORMAL HIGH (ref 11.6–15.2)

## 2015-03-21 LAB — GLUCOSE, CAPILLARY
Glucose-Capillary: 136 mg/dL — ABNORMAL HIGH (ref 65–99)
Glucose-Capillary: 103 mg/dL — ABNORMAL HIGH (ref 65–99)

## 2015-03-21 MED ORDER — WARFARIN SODIUM 5 MG PO TABS
10.0000 mg | ORAL_TABLET | Freq: Once | ORAL | Status: AC
  Administered 2015-03-21: 10 mg via ORAL
  Filled 2015-03-21: qty 2

## 2015-03-21 MED ORDER — PANTOPRAZOLE SODIUM 40 MG PO TBEC: 40.0000 mg | DELAYED_RELEASE_TABLET | Freq: Two times a day (BID) | ORAL | Status: DC

## 2015-03-21 MED ORDER — ENOXAPARIN SODIUM 80 MG/0.8ML ~~LOC~~ SOLN
80.0000 mg | Freq: Once | SUBCUTANEOUS | Status: AC
  Administered 2015-03-22: 80 mg via SUBCUTANEOUS
  Filled 2015-03-21: qty 0.8

## 2015-03-21 MED ORDER — WARFARIN SODIUM 7.5 MG PO TABS: 7.5000 mg | ORAL_TABLET | Freq: Every day | ORAL | Status: DC

## 2015-03-21 MED ORDER — WARFARIN SODIUM 10 MG PO TABS: 10.0000 mg | ORAL_TABLET | Freq: Once | ORAL | Status: DC

## 2015-03-21 MED ORDER — FERROUS SULFATE 325 (65 FE) MG PO TABS: 325.0000 mg | ORAL_TABLET | Freq: Three times a day (TID) | ORAL | Status: DC

## 2015-03-21 MED ORDER — ENOXAPARIN SODIUM 80 MG/0.8ML ~~LOC~~ SOLN
80.0000 mg | Freq: Two times a day (BID) | SUBCUTANEOUS | Status: DC
  Administered 2015-03-22: 80 mg via SUBCUTANEOUS
  Filled 2015-03-21: qty 0.8

## 2015-03-21 MED ORDER — ENOXAPARIN SODIUM 80 MG/0.8ML ~~LOC~~ SOLN: 80.0000 mg | Freq: Once | SUBCUTANEOUS | Status: DC

## 2015-03-21 MED ORDER — ENOXAPARIN SODIUM 80 MG/0.8ML ~~LOC~~ SOLN
80.0000 mg | SUBCUTANEOUS | Status: AC
  Administered 2015-03-21: 80 mg via SUBCUTANEOUS
  Filled 2015-03-21: qty 0.8

## 2015-03-21 NOTE — Progress Notes (Addendum)
[employer] called to get auth for admission to [facility] since CIR unable to accept- auth unable to be started till 3:30 when CIR case official closed- unable to get auth today  [facility] has called patient daughter to complete paperwork and will anticipate pt return to [facility] if pt is stable for DC tomorrow  Pt daughter also refusing to have pt leave until bladder scan is completed- MD aware and will order  MD informed.  CSW will continue to follow  Merlyn Lot, [facility] Clinical Social Worker [phone]

## 2015-03-21 NOTE — Progress Notes (Signed)
I have received an insurance denial for my request for an inpt rehab admission. I have met with pt at bedside and have informed him. His second choice is to return to SNF at [facility] as he was there before. I have notified RN CM and SW. I will alert Dr. Emmaline Life. We will sign off. [phone]

## 2015-03-21 NOTE — Progress Notes (Signed)
I await insurance decision on possible inpt rehab admission today. [phone]

## 2015-03-21 NOTE — Progress Notes (Signed)
Family Medicine Teaching Service Daily Progress Note Intern Pager: [phone]  Patient name: Brian Burns Medical record number: [id number] Date of birth: 09-26-1945 Age: 70 y.o. Gender: male  Primary Care Provider: Shirlee Latch, MD Consultants: GI, urology Code Status: DNR  Pt Overview and Major Events to Date:  2/16 - admitted for hematemesis 2/17 - EGD with clipping x3, transfused 3u pRBCs  Assessment and Plan: 70 yo male with h/o afib, CAD, CKD, achalasia, presented with hematemesis, hx of CVA, and Afib  GI bleed (AVM vs. Ulcers) s/p clip x3 in esphogaus and 3u PRBC, hgb 7.3, stable, vitals stable  - continue PPI, sucralfate, and CREON - Continue Coumadin, started 2/19 due to past stroke and Afib, ok per GI,  - PT consulted for possible CIR admit, awaiting determination  - Start 325 mg Iron TID   Hx of CVA, hx of atrial flutter: CHAD2VASC 5; HASBLED 3. Discussed risk and benefit of anticogulation with patients. He states "he is more worried about having another stroke, then bleeding from the the blood thinners"  - Continue warfarin, will bridge with lovenox as CHAD2VASC 5 and patient is amenable to this  - INR 1.30 this AM  - No rate control as low HR (50s)   Urinary retention secondary to stricture s/p foley (now removed). Urine culture positive for MRSA  - Uro signed off 2/18, will need outpatient f/u - Patient without issues with urination  - Urine culture positive for MRSA, will treat and discuss with urology  - Urology, Dr. Marlou Porch states if patient does not have symptoms, would not treat. UA was positive for LE and Nitrites but because patient is on pyridium this is not useful for diagnosis per our conversation; and therefore Dr. Marlou Porch does not recommend treating MRSA found in urine culture   - Will consult urology to determine if need for treatment   FEN/GI: regular, SLIV PPx: SCDs, Warfarin  Disposition: CIR today   Subjective:  Patient doing well today, no  issues. No SOB, no palpations, no n/v, or fever/chills   Objective: Temp:  [98.3 F (36.8 C)-98.7 F (37.1 C)] 98.3 F (36.8 C) (02/21 0628) Pulse Rate:  [51-91] 91 (02/21 0920) Resp:  [16-20] 16 (02/21 0628) BP: (112-120)/(70-81) 112/70 mmHg (02/21 0628) SpO2:  [92 %-99 %] 96 % (02/21 0920) Physical Exam: General: well appearing, sitting up in bed eating breakfast Lungs: CTAB, no wheezes, rhonchi  CVS: Regular rate and rhythm Abdomen: soft, non-tender, non-distended, no rebound Extremities: [facility]  Laboratory:  Recent Labs Lab 03/19/15 0339 03/20/15 0643 03/21/15 0659  WBC 5.5 5.4 5.7  HGB 7.5* 7.6* 7.3*  HCT 23.1* 23.5* 23.2*  PLT 108* 122* 147*    Recent Labs Lab 03/16/15 0933  03/17/15 0609 03/18/15 0309 03/21/15 0659  NA 137  < > 137 140 138  K 3.1*  < > 3.6 4.3 3.7  CL 105  < > 108 110 109  CO2 24  --  22 24 23   BUN 10  < > 19 12 10   CREATININE 1.29*  < > 1.06 1.01 1.23  CALCIUM 7.8*  --  7.3* 7.9* 8.1*  PROT 6.0*  --  5.0*  --   --   BILITOT 0.4  --  0.5  --   --   ALKPHOS 109  --  82  --   --   ALT 39  --  30  --   --   AST 33  --  24  --   --  GLUCOSE 178*  < > 100* 103* 137*  < > = values in this interval not displayed.  Results for Brian, Burns (MRN [id number]) as of 03/20/2015 09:11  Ref. Range 03/20/2015 06:43  Prothrombin Time Latest Ref Range: 11.6-15.2 seconds 16.1 (H)  INR Latest Ref Range: 0.00-1.49  1.27   Imaging/Diagnostic Tests: Dg Chest Portable 1 View  03/16/2015  CLINICAL DATA:  Hematemesis. EXAM: PORTABLE CHEST 1 VIEW COMPARISON:  02/13/2015 FINDINGS: The heart size and mediastinal contours are within normal limits. Both lungs are clear. Interval clearing of bilateral infiltrates. The visualized skeletal structures are unremarkable. IMPRESSION: No active disease. Electronically Signed   By: Brian Burns M.D.   On: 03/16/2015 10:22    Brian Engelson Mayra Reel, MD 03/21/2015, 9:32 AM PGY-1, [facility] Family Medicine FPTS Intern  pager: [phone], text pages welcome

## 2015-03-21 NOTE — Discharge Summary (Signed)
Family Medicine Teaching [facility] Discharge Summary  Patient name: Brian Burns Medical record number: [id number] Date of birth: 1945/03/21 Age: 70 y.o. Gender: male Date of Admission: 03/16/2015  Date of Discharge: 03/22/2015 Admitting Physician: Moses Manners, MD  Primary Care Provider: Shirlee Latch, MD Consultants: GI, Urology   Indication for Hospitalization: Upper GI bleed  Discharge Diagnoses/Problem List:  PVD History of CVA Hypertension  GERD  A flutter HLD  Disposition: SNF   Discharge Condition: Stable   Discharge Exam:  General: well appearing, sitting up in bed eating breakfast Lungs: CTAB, no wheezes, rhonchi  CVS: Regular rate and rhythm Abdomen: soft, non-tender, non-distended, no rebound Extremities: [facility]  Brief Hospital Course:  Patient was admitted for hematemesis. He had 4 episodes of vomiting with bright red blood. His hemoglobin was down to 8.2 from 11.9 on January 9th. Patient had a history of achalasia requiring dilation in December,  however due to patient cooperation never had mammography to confirm diagnosis. Patient was transfused 2 units blood during his admission on 2/16. An emergent upper endoscopy was performed and active bleeding ulcers versus AVM were found distal to the GE junction. These were tamponaded with 3 endoclips. Patient tolerated this procedure well. Due to GI bleeds, anticoagulation was stopped. Hemoglobin remained throughout stay. Patient was started on a PPI, Creon, sulfracate by GI. On 2/19, was restarted on anticoagulation with warfarin per discussion with GI. Patient had CHADS2VASC score of 5 and therefore after discussing the risks and benefits of anticoagulation, decided to bridge patient with Lovenox.  Also has a history of urinary retention with stricture and was seen by urology during this admission, as patient was not currently using a urinary catheter. Patient's urinary retention had improved and urology cleared  him for any further need of Foley catheter. Patient was found to have a urine culture positive for MRSA however after discussion with urology Dr.Herrick decided not to treat infection.  Issues for Follow Up:  1. Continue Lovenox 80 mg every 12 hours, bridge until INR greater than 2  2. Warfarin 10 mg for 3 more days, then back 7.5 mg daily - please recheck INR daily  3. Continue PPI BID as per GI.  4. Will need follow up anemia with CBC   Significant Procedures:  03/16/2015 EGD - AVM vs Ulcers with 3 endoclips   Significant Labs and Imaging:   Recent Labs Lab 03/19/15 0339 03/20/15 0643 03/21/15 0659  WBC 5.5 5.4 5.7  HGB 7.5* 7.6* 7.3*  HCT 23.1* 23.5* 23.2*  PLT 108* 122* 147*    Recent Labs Lab 03/16/15 0933 03/16/15 0940 03/16/15 2003 03/17/15 0609 03/18/15 0309 03/21/15 0659  NA 137 140  --  137 140 138  K 3.1* 3.2*  --  3.6 4.3 3.7  CL 105 102  --  108 110 109  CO2 24  --   --  GLUCOSE 178* 171*  --  100* 103* 137*  BUN 10 8  --  CREATININE 1.29* 1.10  --  1.06 1.01 1.23  CALCIUM 7.8*  --   --  7.3* 7.9* 8.1*  MG  --   --  1.6*  --  2.0  --   ALKPHOS 109  --   --  82  --   --   AST 33  --   --  24  --   --   ALT 39  --   --  30  --   --  ALBUMIN 2.7*  --   --  2.3*  --   --     Results/Tests Pending at Time of Discharge: None   Discharge Medications:    Medication List    STOP taking these medications        omeprazole 20 MG capsule  Commonly known as:  PRILOSEC     OVER THE COUNTER MEDICATION      TAKE these medications        amLODipine 10 MG tablet  Commonly known as:  NORVASC  Take 1 tablet (10 mg total) by mouth daily.     aspirin EC 81 MG tablet  Take 81 mg by mouth daily.     atorvastatin 40 MG tablet  Commonly known as:  LIPITOR  Take 1 tablet (40 mg total) by mouth daily.     citalopram 20 MG tablet  Commonly known as:  CELEXA  Take 20 mg by mouth daily at 6 PM.     enoxaparin 80 MG/0.8ML injection   Commonly known as:  LOVENOX  Inject 0.8 mLs (80 mg total) into the skin once. Until INR is greater than 2  Start taking on:  03/22/2015     ferrous sulfate 325 (65 FE) MG tablet  Take 1 tablet (325 mg total) by mouth 3 (three) times daily with meals.     finasteride 5 MG tablet  Commonly known as:  PROSCAR  Take 1 tablet (5 mg total) by mouth daily.     gabapentin 100 MG capsule  Commonly known as:  NEURONTIN  Take 1 capsule (100 mg total) by mouth 3 (three) times daily.     lipase/protease/amylase 76147 units Cpep capsule  Commonly known as:  CREON  Take 1 capsule (12,000 Units total) by mouth 3 (three) times daily with meals.     ondansetron 4 MG tablet  Commonly known as:  ZOFRAN  Take 1 tablet (4 mg total) by mouth every 6 (six) hours as needed for nausea or vomiting.     pantoprazole 40 MG tablet  Commonly known as:  PROTONIX  Take 1 tablet (40 mg total) by mouth 2 (two) times daily.     phenazopyridine 200 MG tablet  Commonly known as:  PYRIDIUM  Take 1 tablet (200 mg total) by mouth 3 (three) times daily as needed for pain.     sucralfate 1 g tablet  Commonly known as:  CARAFATE  Take 1 tablet (1 g total) by mouth 3 (three) times daily as needed.     tamsulosin 0.4 MG Caps capsule  Commonly known as:  FLOMAX  Take 1 capsule (0.4 mg total) by mouth daily.     warfarin 10 MG tablet  Commonly known as:  COUMADIN  Take 1 tablet (10 mg total) by mouth one time only at 6 PM. Please take Warfarin  for 3 more days        Discharge Instructions: Please refer to Patient Instructions section of EMR for full details.  Patient was counseled important signs and symptoms that should prompt return to medical care, changes in medications, dietary instructions, activity restrictions, and follow up appointments.   Follow-Up Appointments:   Berton Bon, MD 03/21/2015, 4:36 PM PGY-1, [facility] Family [facility]

## 2015-03-21 NOTE — Evaluation (Signed)
Occupational Therapy Evaluation Patient Details Name: Brian Burns MRN: [id number] DOB: 10-Feb-1945 Today's Date: 03/21/2015    History of Present Illness 70 year old male with a history of hypertension, dyslipidemia, achalasia, gastroesophageal reflux, atrial fibrillation on Coumadin, recent NG tube placement 2 by Dr. [name] with dislodgment, status post EGD and empiric dilatation. scheduled to have a manometry next week, presents today with 4 episodes of hematemesis.    Clinical Impression   Pt currently min assist for LB selfcare sit to stand and min assist for toilet transfers without use of an assistive device and regular toilet.  Feel he will benefit from acute care OT at this time with transition to CIR for short stay to achieve modified independent level. Will continue to follow.     Follow Up Recommendations  CIR    Equipment Recommendations  Tub/shower bench       Precautions / Restrictions Precautions Precautions: Fall Restrictions Weight Bearing Restrictions: No      Mobility Bed Mobility                  Transfers Overall transfer level: Needs assistance Equipment used: None Transfers: Sit to/from Stand Sit to Stand: Min assist Stand pivot transfers: Min assist            Balance     Sitting balance-Leahy Scale: Good       Standing balance-Leahy Scale: Fair                              ADL Overall ADL's : Needs assistance/impaired Eating/Feeding: Independent;Sitting   Grooming: Wash/dry hands;Min guard;Standing   Upper Body Bathing: Supervision/ safety;Sitting   Lower Body Bathing: Minimal assistance;Sit to/from stand   Upper Body Dressing : Supervision/safety;Sitting   Lower Body Dressing: Minimal assistance;Sit to/from stand   Toilet Transfer: Minimal assistance;Ambulation;Regular Toilet;Grab bars   Toileting- Clothing Manipulation and Hygiene: Minimal assistance;Sit to/from stand       Functional mobility  during ADLs: Min guard General ADL Comments: Pt with flexed posture in standing.  He reports having history of back issues.  Slight difficulty inserting the RLE in his pants leg after placing the left.       Vision Vision Assessment?: Vision impaired- to be further tested in functional context (Not tested this session)   Perception Perception Perception Tested?: Yes   Praxis Praxis Praxis tested?: Within functional limits    Pertinent Vitals/Pain Pain Assessment: No/denies pain     Hand Dominance Right   Extremity/Trunk Assessment Upper Extremity Assessment Upper Extremity Assessment: Overall WFL for tasks assessed (strength 4/5 throughout)   Lower Extremity Assessment Lower Extremity Assessment: Defer to PT evaluation   Cervical / Trunk Assessment Cervical / Trunk Assessment: Kyphotic   Communication Communication Communication: Expressive difficulties   Cognition Arousal/Alertness: Awake/alert Behavior During Therapy: WFL for tasks assessed/performed Overall Cognitive Status: Within Functional Limits for tasks assessed                                Home Living Family/patient expects to be discharged to:: Skilled nursing facility Living Arrangements: Other (Comment) Available Help at Discharge: Family;Available 24 hours/day Type of Home: House Home Access: Stairs to enter [employer] of Steps: 5 Entrance Stairs-Rails: Right;Left Home Layout: One level     Bathroom Shower/Tub: Curtain;Tub/shower unit   Firefighter: Standard     Home Equipment: Environmental consultant - 2 wheels;Cane - single  point;Bedside commode   Additional Comments: Pt was at [facility] prior to this admission.      Prior Functioning/Environment Level of Independence: Independent        Comments: Used walker outside if he was going up a hill.    OT Diagnosis: Generalized weakness   OT Problem List: Decreased strength;Decreased activity tolerance;Impaired balance  (sitting and/or standing);Decreased knowledge of use of DME or AE   OT Treatment/Interventions: Self-care/ADL training;Therapeutic exercise;Neuromuscular education;Balance training;Therapeutic activities;DME and/or AE instruction;Patient/family education    OT Goals(Current goals can be found in the care plan section) Acute Rehab OT Goals Patient Stated Goal: To get stronger Time For Goal Achievement: 04/04/15 Potential to Achieve Goals: Good  OT Frequency: Min 2X/week              End of Session Nurse Communication: Mobility status  Activity Tolerance: Patient tolerated treatment well Patient left: in chair;with call bell/phone within reach   Time: 0911-0956 OT Time Calculation (min): 45 min Charges:  OT General Charges $OT Visit: 1 Procedure OT Evaluation $OT Eval Moderate Complexity: 1 Procedure OT Treatments $Self Care/Home Management : 23-37 mins  Miela Desjardin OTR/L 03/21/2015, 10:00 AM

## 2015-03-21 NOTE — Progress Notes (Addendum)
ANTICOAGULATION CONSULT NOTE - Follow Up Consult  Pharmacy Consult for warfarin Indication: atrial fibrillation  Allergies  Allergen Reactions  . Novocain [Procaine Hcl] Nausea And Vomiting  . Amlodipine     On MAR  . Aspirin     On MAR  . Atorvastatin     On MAR  . Calcium Carbonate     On MAR  . Doxycycline     On MAR    Patient Measurements: Height: 6\' 4"  (193 cm) Weight: 181 lb (82.102 kg) IBW/kg (Calculated) : 86.8  Vital Signs: Temp: 98.3 F (36.8 C) (02/21 0628) Temp Source: Oral (02/21 0628) BP: 112/70 mmHg (02/21 0628) Pulse Rate: 57 (02/21 0628)  Labs:  Recent Labs  03/19/15 0339 03/20/15 0643 03/21/15 0659  HGB 7.5* 7.6* 7.3*  HCT 23.1* 23.5* 23.2*  PLT 108* 122* 147*  LABPROT  --  16.1* 16.3*  INR  --  1.27 1.30  CREATININE  --   --  1.23    Estimated Creatinine Clearance: 66.7 mL/min (by C-G formula based on Cr of 1.23).  Assessment: 70 y/o male who presented with hematemesis found to have upper GI bleed and is s/p endoscopic clipping. He took warfarin PTA for Afib and hx stroke. KCentra and vitamin K given on 2/16 so may take a little longer to get INR therapeutic.  PTA regimen: 7.5 mg/d  Warfarin resumed 2/19. INR is subtherapeutic today at 1.3. No bleeding noted, Hb stable in 7's, platelets low but trending up.  Goal of Therapy:  INR 2-3 Monitor platelets by anticoagulation protocol: Yes   Plan:  - Repeat warfarin 10 mg PO tonight - INR daily - Monitor for s/sx of bleeding  [facility], [address].D., BCPS Clinical Pharmacist Pager: [phone] 03/21/2015 8:46 AM   Addendum: Patient is more concerned about another stroke vs bleeding per FMTS note, therefore also consulted to bridge with Lovenox.   Lovenox 80 mg SQ q12h - staggered doses to get on patient friendly schedule CBC at least q72h while on Lovenox Monitor for s/sx of bleeding  [facility], [address].D., BCPS Clinical Pharmacist Pager: [phone] 03/21/2015 1:07  PM

## 2015-03-22 ENCOUNTER — Other Ambulatory Visit: Payer: Self-pay | Admitting: Gastroenterology

## 2015-03-22 DIAGNOSIS — Z5181 Encounter for therapeutic drug level monitoring: Secondary | ICD-10-CM | POA: Diagnosis not present

## 2015-03-22 DIAGNOSIS — N189 Chronic kidney disease, unspecified: Secondary | ICD-10-CM | POA: Diagnosis not present

## 2015-03-22 DIAGNOSIS — E441 Mild protein-calorie malnutrition: Secondary | ICD-10-CM | POA: Diagnosis not present

## 2015-03-22 DIAGNOSIS — E784 Other hyperlipidemia: Secondary | ICD-10-CM | POA: Diagnosis not present

## 2015-03-22 DIAGNOSIS — E119 Type 2 diabetes mellitus without complications: Secondary | ICD-10-CM | POA: Diagnosis not present

## 2015-03-22 DIAGNOSIS — I10 Essential (primary) hypertension: Secondary | ICD-10-CM | POA: Diagnosis not present

## 2015-03-22 DIAGNOSIS — R278 Other lack of coordination: Secondary | ICD-10-CM | POA: Diagnosis not present

## 2015-03-22 DIAGNOSIS — N359 Urethral stricture, unspecified: Secondary | ICD-10-CM | POA: Diagnosis not present

## 2015-03-22 DIAGNOSIS — R2689 Other abnormalities of gait and mobility: Secondary | ICD-10-CM | POA: Diagnosis not present

## 2015-03-22 DIAGNOSIS — N184 Chronic kidney disease, stage 4 (severe): Secondary | ICD-10-CM | POA: Diagnosis not present

## 2015-03-22 DIAGNOSIS — R350 Frequency of micturition: Secondary | ICD-10-CM | POA: Diagnosis not present

## 2015-03-22 DIAGNOSIS — I2510 Atherosclerotic heart disease of native coronary artery without angina pectoris: Secondary | ICD-10-CM | POA: Diagnosis not present

## 2015-03-22 DIAGNOSIS — R1314 Dysphagia, pharyngoesophageal phase: Secondary | ICD-10-CM | POA: Diagnosis not present

## 2015-03-22 DIAGNOSIS — I4891 Unspecified atrial fibrillation: Secondary | ICD-10-CM | POA: Diagnosis not present

## 2015-03-22 DIAGNOSIS — K922 Gastrointestinal hemorrhage, unspecified: Secondary | ICD-10-CM | POA: Diagnosis not present

## 2015-03-22 DIAGNOSIS — Z8673 Personal history of transient ischemic attack (TIA), and cerebral infarction without residual deficits: Secondary | ICD-10-CM | POA: Diagnosis not present

## 2015-03-22 DIAGNOSIS — I714 Abdominal aortic aneurysm, without rupture: Secondary | ICD-10-CM | POA: Diagnosis not present

## 2015-03-22 DIAGNOSIS — I639 Cerebral infarction, unspecified: Secondary | ICD-10-CM | POA: Diagnosis not present

## 2015-03-22 DIAGNOSIS — Z0000 Encounter for general adult medical examination without abnormal findings: Secondary | ICD-10-CM | POA: Diagnosis not present

## 2015-03-22 DIAGNOSIS — N401 Enlarged prostate with lower urinary tract symptoms: Secondary | ICD-10-CM | POA: Diagnosis not present

## 2015-03-22 DIAGNOSIS — M6281 Muscle weakness (generalized): Secondary | ICD-10-CM | POA: Diagnosis not present

## 2015-03-22 LAB — CBC
HEMATOCRIT: 23.2 % — AB (ref 39.0–52.0)
HEMOGLOBIN: 7.5 g/dL — AB (ref 13.0–17.0)
MCH: 29.1 pg (ref 26.0–34.0)
MCHC: 32.3 g/dL (ref 30.0–36.0)
MCV: 89.9 fL (ref 78.0–100.0)
Platelets: 139 10*3/uL — ABNORMAL LOW (ref 150–400)
RBC: 2.58 MIL/uL — ABNORMAL LOW (ref 4.22–5.81)
RDW: 17.3 % — AB (ref 11.5–15.5)
WBC: 6.3 10*3/uL (ref 4.0–10.5)

## 2015-03-22 LAB — PROTIME-INR
INR: 1.59 — AB (ref 0.00–1.49)
PROTHROMBIN TIME: 18.9 s — AB (ref 11.6–15.2)

## 2015-03-22 NOTE — Progress Notes (Signed)
Report given to John,Nurse for Discharge to [facility]

## 2015-03-22 NOTE — Progress Notes (Signed)
Insurance auth received: # [id number]  Patient will discharge to [facility] Anticipated discharge date: 2/22 Family notified: pt daughter Transportation by daughter [name]  CSW signing off.  Merlyn Lot, LCSWA Clinical Social Worker [phone]

## 2015-03-22 NOTE — Progress Notes (Signed)
Pt taken downstairs via wheelchair by hospital volunteer and being transported to the facility by his daughter.

## 2015-03-22 NOTE — Care Management Note (Signed)
Case Management Note  Patient Details  Name: TREYVONNE FELIPE MRN: [id number] Date of Birth: 06-13-1945  Subjective/Objective:                 DC to [facility] today as facilitated by CSW.   Action/Plan:   Expected Discharge Date:   (UNKNOWN)               Expected Discharge Plan:  Acute to Acute Transfer  In-House Referral:     Discharge planning Services  CM Consult  Post Acute Care Choice:  NA Choice offered to:  NA  DME Arranged:    DME Agency:     HH Arranged:    HH Agency:     Status of Service:  Completed, signed off  Medicare Important Message Given:  Yes Date Medicare IM Given:    Medicare IM give by:    Date Additional Medicare IM Given:    Additional Medicare Important Message give by:     If discussed at Long Length of Stay Meetings, dates discussed:    Additional Comments:  Lawerance Sabal, RN 03/22/2015, 9:49 AM

## 2015-03-23 DIAGNOSIS — E784 Other hyperlipidemia: Secondary | ICD-10-CM | POA: Diagnosis not present

## 2015-03-23 DIAGNOSIS — I2510 Atherosclerotic heart disease of native coronary artery without angina pectoris: Secondary | ICD-10-CM | POA: Diagnosis not present

## 2015-03-23 DIAGNOSIS — I10 Essential (primary) hypertension: Secondary | ICD-10-CM | POA: Diagnosis not present

## 2015-03-23 DIAGNOSIS — I4891 Unspecified atrial fibrillation: Secondary | ICD-10-CM | POA: Diagnosis not present

## 2015-03-23 DIAGNOSIS — E119 Type 2 diabetes mellitus without complications: Secondary | ICD-10-CM | POA: Diagnosis not present

## 2015-03-23 DIAGNOSIS — E441 Mild protein-calorie malnutrition: Secondary | ICD-10-CM | POA: Diagnosis not present

## 2015-03-23 DIAGNOSIS — I714 Abdominal aortic aneurysm, without rupture: Secondary | ICD-10-CM | POA: Diagnosis not present

## 2015-03-23 DIAGNOSIS — N184 Chronic kidney disease, stage 4 (severe): Secondary | ICD-10-CM | POA: Diagnosis not present

## 2015-03-23 DIAGNOSIS — Z8673 Personal history of transient ischemic attack (TIA), and cerebral infarction without residual deficits: Secondary | ICD-10-CM | POA: Diagnosis not present

## 2015-03-27 DIAGNOSIS — I639 Cerebral infarction, unspecified: Secondary | ICD-10-CM | POA: Diagnosis not present

## 2015-03-27 DIAGNOSIS — N359 Urethral stricture, unspecified: Secondary | ICD-10-CM | POA: Diagnosis not present

## 2015-03-27 DIAGNOSIS — I4891 Unspecified atrial fibrillation: Secondary | ICD-10-CM | POA: Diagnosis not present

## 2015-03-27 DIAGNOSIS — N189 Chronic kidney disease, unspecified: Secondary | ICD-10-CM | POA: Diagnosis not present

## 2015-03-28 ENCOUNTER — Ambulatory Visit (HOSPITAL_COMMUNITY)
Admission: RE | Admit: 2015-03-28 | Payer: Commercial Managed Care - HMO | Source: Ambulatory Visit | Admitting: Gastroenterology

## 2015-03-28 ENCOUNTER — Encounter (HOSPITAL_COMMUNITY): Admission: RE | Payer: Self-pay | Source: Ambulatory Visit

## 2015-03-28 SURGERY — ESOPHAGOGASTRODUODENOSCOPY (EGD) WITH PROPOFOL
Anesthesia: Monitor Anesthesia Care

## 2015-03-31 DIAGNOSIS — I639 Cerebral infarction, unspecified: Secondary | ICD-10-CM | POA: Diagnosis not present

## 2015-03-31 DIAGNOSIS — I4891 Unspecified atrial fibrillation: Secondary | ICD-10-CM | POA: Diagnosis not present

## 2015-03-31 DIAGNOSIS — N359 Urethral stricture, unspecified: Secondary | ICD-10-CM | POA: Diagnosis not present

## 2015-03-31 DIAGNOSIS — K922 Gastrointestinal hemorrhage, unspecified: Secondary | ICD-10-CM | POA: Diagnosis not present

## 2015-04-03 DIAGNOSIS — R350 Frequency of micturition: Secondary | ICD-10-CM | POA: Diagnosis not present

## 2015-04-03 DIAGNOSIS — K922 Gastrointestinal hemorrhage, unspecified: Secondary | ICD-10-CM | POA: Diagnosis not present

## 2015-04-03 DIAGNOSIS — I4891 Unspecified atrial fibrillation: Secondary | ICD-10-CM | POA: Diagnosis not present

## 2015-04-03 DIAGNOSIS — N359 Urethral stricture, unspecified: Secondary | ICD-10-CM | POA: Diagnosis not present

## 2015-04-03 DIAGNOSIS — N189 Chronic kidney disease, unspecified: Secondary | ICD-10-CM | POA: Diagnosis not present

## 2015-04-04 ENCOUNTER — Ambulatory Visit: Payer: Commercial Managed Care - HMO | Admitting: Podiatry

## 2015-04-04 DIAGNOSIS — I739 Peripheral vascular disease, unspecified: Secondary | ICD-10-CM | POA: Diagnosis not present

## 2015-04-04 DIAGNOSIS — I714 Abdominal aortic aneurysm, without rupture: Secondary | ICD-10-CM | POA: Diagnosis not present

## 2015-04-04 DIAGNOSIS — N189 Chronic kidney disease, unspecified: Secondary | ICD-10-CM | POA: Diagnosis not present

## 2015-04-04 DIAGNOSIS — I7389 Other specified peripheral vascular diseases: Secondary | ICD-10-CM | POA: Diagnosis not present

## 2015-04-04 DIAGNOSIS — N401 Enlarged prostate with lower urinary tract symptoms: Secondary | ICD-10-CM | POA: Diagnosis not present

## 2015-04-04 DIAGNOSIS — I129 Hypertensive chronic kidney disease with stage 1 through stage 4 chronic kidney disease, or unspecified chronic kidney disease: Secondary | ICD-10-CM | POA: Diagnosis not present

## 2015-04-05 ENCOUNTER — Other Ambulatory Visit (INDEPENDENT_AMBULATORY_CARE_PROVIDER_SITE_OTHER): Payer: Commercial Managed Care - HMO | Admitting: Family Medicine

## 2015-04-05 ENCOUNTER — Other Ambulatory Visit: Payer: Commercial Managed Care - HMO

## 2015-04-05 DIAGNOSIS — N401 Enlarged prostate with lower urinary tract symptoms: Secondary | ICD-10-CM | POA: Diagnosis not present

## 2015-04-05 DIAGNOSIS — I4892 Unspecified atrial flutter: Secondary | ICD-10-CM

## 2015-04-05 DIAGNOSIS — I129 Hypertensive chronic kidney disease with stage 1 through stage 4 chronic kidney disease, or unspecified chronic kidney disease: Secondary | ICD-10-CM | POA: Diagnosis not present

## 2015-04-05 DIAGNOSIS — I714 Abdominal aortic aneurysm, without rupture: Secondary | ICD-10-CM | POA: Diagnosis not present

## 2015-04-05 DIAGNOSIS — I739 Peripheral vascular disease, unspecified: Secondary | ICD-10-CM | POA: Diagnosis not present

## 2015-04-05 DIAGNOSIS — N189 Chronic kidney disease, unspecified: Secondary | ICD-10-CM | POA: Diagnosis not present

## 2015-04-05 LAB — POCT HEMOGLOBIN: HEMOGLOBIN: 8.4 g/dL — AB (ref 14.1–18.1)

## 2015-04-05 LAB — POCT INR: INR: 1.3

## 2015-04-05 MED ORDER — WARFARIN SODIUM 2.5 MG PO TABS: ORAL_TABLET | ORAL | Status: DC

## 2015-04-05 MED ORDER — WARFARIN SODIUM 10 MG PO TABS: 10.0000 mg | ORAL_TABLET | Freq: Every day | ORAL | Status: DC

## 2015-04-05 NOTE — Progress Notes (Signed)
INR 1.3 today. Adjust Coumadin to 12.5 mg T/T/Sun and 10mg  every other day. F/u INR in 1 wk.  Erasmo Downer, MD, MPH PGY-2,  [facility] 04/05/2015 10:04 AM

## 2015-04-07 DIAGNOSIS — I739 Peripheral vascular disease, unspecified: Secondary | ICD-10-CM | POA: Diagnosis not present

## 2015-04-07 DIAGNOSIS — I714 Abdominal aortic aneurysm, without rupture: Secondary | ICD-10-CM | POA: Diagnosis not present

## 2015-04-07 DIAGNOSIS — N401 Enlarged prostate with lower urinary tract symptoms: Secondary | ICD-10-CM | POA: Diagnosis not present

## 2015-04-07 DIAGNOSIS — I129 Hypertensive chronic kidney disease with stage 1 through stage 4 chronic kidney disease, or unspecified chronic kidney disease: Secondary | ICD-10-CM | POA: Diagnosis not present

## 2015-04-07 DIAGNOSIS — N189 Chronic kidney disease, unspecified: Secondary | ICD-10-CM | POA: Diagnosis not present

## 2015-04-11 DIAGNOSIS — I129 Hypertensive chronic kidney disease with stage 1 through stage 4 chronic kidney disease, or unspecified chronic kidney disease: Secondary | ICD-10-CM | POA: Diagnosis not present

## 2015-04-11 DIAGNOSIS — I739 Peripheral vascular disease, unspecified: Secondary | ICD-10-CM | POA: Diagnosis not present

## 2015-04-11 DIAGNOSIS — N189 Chronic kidney disease, unspecified: Secondary | ICD-10-CM | POA: Diagnosis not present

## 2015-04-11 DIAGNOSIS — I714 Abdominal aortic aneurysm, without rupture: Secondary | ICD-10-CM | POA: Diagnosis not present

## 2015-04-11 DIAGNOSIS — N401 Enlarged prostate with lower urinary tract symptoms: Secondary | ICD-10-CM | POA: Diagnosis not present

## 2015-04-12 ENCOUNTER — Other Ambulatory Visit: Payer: Self-pay | Admitting: *Deleted

## 2015-04-12 ENCOUNTER — Other Ambulatory Visit: Payer: Commercial Managed Care - HMO

## 2015-04-12 ENCOUNTER — Telehealth: Payer: Self-pay | Admitting: Family Medicine

## 2015-04-12 ENCOUNTER — Other Ambulatory Visit (INDEPENDENT_AMBULATORY_CARE_PROVIDER_SITE_OTHER): Payer: Commercial Managed Care - HMO

## 2015-04-12 DIAGNOSIS — I4892 Unspecified atrial flutter: Secondary | ICD-10-CM | POA: Diagnosis not present

## 2015-04-12 LAB — POCT INR: INR: 2.4

## 2015-04-12 MED ORDER — FINASTERIDE 5 MG PO TABS: 5.0000 mg | ORAL_TABLET | Freq: Every day | ORAL | Status: DC

## 2015-04-12 MED ORDER — TAMSULOSIN HCL 0.4 MG PO CAPS: 0.4000 mg | ORAL_CAPSULE | Freq: Every day | ORAL | Status: DC

## 2015-04-12 MED ORDER — CITALOPRAM HYDROBROMIDE 20 MG PO TABS: 20.0000 mg | ORAL_TABLET | Freq: Every day | ORAL | Status: DC

## 2015-04-12 MED ORDER — PANTOPRAZOLE SODIUM 40 MG PO TBEC: 40.0000 mg | DELAYED_RELEASE_TABLET | Freq: Two times a day (BID) | ORAL | Status: DC

## 2015-04-12 MED ORDER — FERROUS SULFATE 325 (65 FE) MG PO TABS: 325.0000 mg | ORAL_TABLET | Freq: Three times a day (TID) | ORAL | Status: DC

## 2015-04-12 MED ORDER — ATORVASTATIN CALCIUM 40 MG PO TABS: 40.0000 mg | ORAL_TABLET | Freq: Every day | ORAL | Status: DC

## 2015-04-12 MED ORDER — AMLODIPINE BESYLATE 10 MG PO TABS
10.0000 mg | ORAL_TABLET | Freq: Every day | ORAL | Status: DC
Start: 2015-04-12 — End: 2015-09-29

## 2015-04-12 MED ORDER — GABAPENTIN 100 MG PO CAPS: 100.0000 mg | ORAL_CAPSULE | Freq: Three times a day (TID) | ORAL | Status: DC

## 2015-04-12 MED ORDER — PANCRELIPASE (LIP-PROT-AMYL) 12000-38000 UNITS PO CPEP: 12000.0000 [IU] | ORAL_CAPSULE | Freq: Three times a day (TID) | ORAL | Status: DC

## 2015-04-12 MED ORDER — WARFARIN SODIUM 10 MG PO TABS: 10.0000 mg | ORAL_TABLET | Freq: Every day | ORAL | Status: DC

## 2015-04-12 NOTE — Telephone Encounter (Signed)
Ethlyn Gallery obtained #[id number]

## 2015-04-12 NOTE — Telephone Encounter (Signed)
Pt has appt at 9:30 this morning at [facility].  Another referral is needed so he can be seen

## 2015-04-13 ENCOUNTER — Telehealth: Payer: Self-pay | Admitting: Family Medicine

## 2015-04-13 DIAGNOSIS — N189 Chronic kidney disease, unspecified: Secondary | ICD-10-CM | POA: Diagnosis not present

## 2015-04-13 DIAGNOSIS — I739 Peripheral vascular disease, unspecified: Secondary | ICD-10-CM | POA: Diagnosis not present

## 2015-04-13 DIAGNOSIS — N401 Enlarged prostate with lower urinary tract symptoms: Secondary | ICD-10-CM | POA: Diagnosis not present

## 2015-04-13 DIAGNOSIS — I129 Hypertensive chronic kidney disease with stage 1 through stage 4 chronic kidney disease, or unspecified chronic kidney disease: Secondary | ICD-10-CM | POA: Diagnosis not present

## 2015-04-13 DIAGNOSIS — I714 Abdominal aortic aneurysm, without rupture: Secondary | ICD-10-CM | POA: Diagnosis not present

## 2015-04-13 NOTE — Telephone Encounter (Signed)
Daughter called and needs to referral for her father. One is for her eye exam at Dr.Hecker's office on 04/18/15. The second referral is for [facility] with Dr. Stacie Acres on 05/09/15. Brian Burns

## 2015-04-16 DIAGNOSIS — I739 Peripheral vascular disease, unspecified: Secondary | ICD-10-CM | POA: Diagnosis not present

## 2015-04-16 DIAGNOSIS — N401 Enlarged prostate with lower urinary tract symptoms: Secondary | ICD-10-CM | POA: Diagnosis not present

## 2015-04-16 DIAGNOSIS — I714 Abdominal aortic aneurysm, without rupture: Secondary | ICD-10-CM | POA: Diagnosis not present

## 2015-04-16 DIAGNOSIS — I129 Hypertensive chronic kidney disease with stage 1 through stage 4 chronic kidney disease, or unspecified chronic kidney disease: Secondary | ICD-10-CM | POA: Diagnosis not present

## 2015-04-16 DIAGNOSIS — N189 Chronic kidney disease, unspecified: Secondary | ICD-10-CM | POA: Diagnosis not present

## 2015-04-17 NOTE — Telephone Encounter (Signed)
[employer] referrals placed. Auth #'s [id number] & [id number]

## 2015-04-18 ENCOUNTER — Telehealth: Payer: Self-pay | Admitting: Family Medicine

## 2015-04-18 DIAGNOSIS — Z961 Presence of intraocular lens: Secondary | ICD-10-CM | POA: Diagnosis not present

## 2015-04-18 DIAGNOSIS — H35031 Hypertensive retinopathy, right eye: Secondary | ICD-10-CM | POA: Diagnosis not present

## 2015-04-18 DIAGNOSIS — H40033 Anatomical narrow angle, bilateral: Secondary | ICD-10-CM | POA: Diagnosis not present

## 2015-04-18 DIAGNOSIS — H26493 Other secondary cataract, bilateral: Secondary | ICD-10-CM | POA: Diagnosis not present

## 2015-04-18 NOTE — Telephone Encounter (Signed)
Need a [employer] referral for patient's appt with Dr. Alain Honey at 12:45 today.

## 2015-04-19 NOTE — Telephone Encounter (Signed)
This referral was taken of by Asha on 04/17/15. Please refer to Telephone Msg on 03/1615

## 2015-04-19 NOTE — Telephone Encounter (Signed)
Will forward to MD and referral coordinator to see if we need to place a new referral or if we can just add visits to the already existing referral. Madisun Hargrove,CMA

## 2015-04-20 ENCOUNTER — Telehealth: Payer: Self-pay | Admitting: Family Medicine

## 2015-04-20 DIAGNOSIS — I739 Peripheral vascular disease, unspecified: Secondary | ICD-10-CM | POA: Diagnosis not present

## 2015-04-20 DIAGNOSIS — N401 Enlarged prostate with lower urinary tract symptoms: Secondary | ICD-10-CM | POA: Diagnosis not present

## 2015-04-20 DIAGNOSIS — N189 Chronic kidney disease, unspecified: Secondary | ICD-10-CM | POA: Diagnosis not present

## 2015-04-20 DIAGNOSIS — I714 Abdominal aortic aneurysm, without rupture: Secondary | ICD-10-CM | POA: Diagnosis not present

## 2015-04-20 DIAGNOSIS — I129 Hypertensive chronic kidney disease with stage 1 through stage 4 chronic kidney disease, or unspecified chronic kidney disease: Secondary | ICD-10-CM | POA: Diagnosis not present

## 2015-04-20 NOTE — Telephone Encounter (Signed)
Mr. Dellaporta was seen yesterday at Dr. Deirdre Evener Opthal[facility] office, but the incorrect provider was given for his referral.  Need a new referral for that visit for patient seeing Dr. Desiree Lucy, NPI# [id number].  ICD-10 H54.7.  Can contact Elnita Maxwell to confirm at number given in contact field.

## 2015-04-20 NOTE — Telephone Encounter (Signed)
Done

## 2015-04-24 DIAGNOSIS — R350 Frequency of micturition: Secondary | ICD-10-CM | POA: Diagnosis not present

## 2015-04-24 DIAGNOSIS — N189 Chronic kidney disease, unspecified: Secondary | ICD-10-CM | POA: Diagnosis not present

## 2015-04-26 ENCOUNTER — Encounter: Payer: Self-pay | Admitting: Family Medicine

## 2015-04-26 ENCOUNTER — Ambulatory Visit (INDEPENDENT_AMBULATORY_CARE_PROVIDER_SITE_OTHER): Payer: Commercial Managed Care - HMO | Admitting: *Deleted

## 2015-04-26 ENCOUNTER — Ambulatory Visit (INDEPENDENT_AMBULATORY_CARE_PROVIDER_SITE_OTHER): Payer: Commercial Managed Care - HMO | Admitting: Family Medicine

## 2015-04-26 VITALS — BP 121/69 | HR 62 | Temp 97.6°F | Ht 76.0 in | Wt 198.2 lb

## 2015-04-26 DIAGNOSIS — E440 Moderate protein-calorie malnutrition: Secondary | ICD-10-CM

## 2015-04-26 DIAGNOSIS — R634 Abnormal weight loss: Secondary | ICD-10-CM

## 2015-04-26 DIAGNOSIS — K219 Gastro-esophageal reflux disease without esophagitis: Secondary | ICD-10-CM | POA: Diagnosis not present

## 2015-04-26 DIAGNOSIS — D62 Acute posthemorrhagic anemia: Secondary | ICD-10-CM | POA: Diagnosis not present

## 2015-04-26 DIAGNOSIS — E119 Type 2 diabetes mellitus without complications: Secondary | ICD-10-CM

## 2015-04-26 DIAGNOSIS — I10 Essential (primary) hypertension: Secondary | ICD-10-CM

## 2015-04-26 DIAGNOSIS — T18128D Food in esophagus causing other injury, subsequent encounter: Secondary | ICD-10-CM | POA: Diagnosis not present

## 2015-04-26 DIAGNOSIS — R5381 Other malaise: Secondary | ICD-10-CM | POA: Diagnosis not present

## 2015-04-26 DIAGNOSIS — I4892 Unspecified atrial flutter: Secondary | ICD-10-CM

## 2015-04-26 DIAGNOSIS — Q2733 Arteriovenous malformation of digestive system vessel: Secondary | ICD-10-CM | POA: Diagnosis not present

## 2015-04-26 LAB — POCT INR: INR: 3.1

## 2015-04-26 LAB — CBC
HEMATOCRIT: 27 % — AB (ref 39.0–52.0)
HEMOGLOBIN: 8.7 g/dL — AB (ref 13.0–17.0)
MCH: 29.2 pg (ref 26.0–34.0)
MCHC: 32.2 g/dL (ref 30.0–36.0)
MCV: 90.6 fL (ref 78.0–100.0)
MPV: 10.1 fL (ref 8.6–12.4)
Platelets: 162 10*3/uL (ref 150–400)
RBC: 2.98 MIL/uL — AB (ref 4.22–5.81)
RDW: 18 % — ABNORMAL HIGH (ref 11.5–15.5)
WBC: 4.4 10*3/uL (ref 4.0–10.5)

## 2015-04-26 LAB — POCT GLYCOSYLATED HEMOGLOBIN (HGB A1C): Hemoglobin A1C: 5.4

## 2015-04-26 MED ORDER — FERROUS SULFATE 325 (65 FE) MG PO TABS: 325.0000 mg | ORAL_TABLET | Freq: Three times a day (TID) | ORAL | Status: DC

## 2015-04-26 NOTE — Assessment & Plan Note (Signed)
Well-controlled today Check CMET, A1c F/u in 3 months

## 2015-04-26 NOTE — Progress Notes (Signed)
   Subjective:   Brian Burns is a 70 y.o. male with a history of HTN, T2 DM, CAD, achalasia, history of GI bleed here for face-to-face.  Anemia - s/p GI bleed - taking iron TID - needs refill today - no further bleeding  HTN: - Medications: amlodipine 10mg  daily - Compliance: good - Checking BP at home: no - Denies any SOB, CP, vision changes, LE edema, medication SEs, or symptoms of hypotension - Diet: tries to follow low-sodium diet - but likes hot dogs - Exercise: starting back to being active around the house and walking  Need for [facility] RN - Patient was discharged from SNF with home health RN and PT services - Patient has been discharged from PT services as he was doing well - He is currently still using RN services for medication management and possible intermittent catheterization - He reports continued deconditioning and need for use of cane for ambulation   Review of Systems:  Per HPI.   Social History: former smoker  Objective:  BP 121/69 mmHg  Pulse 62  Temp(Src) 97.6 F (36.4 C) (Oral)  Ht 6\' 4"  (1.93 m)  Wt 198 lb 3.2 oz (89.903 kg)  BMI 24.14 kg/m2  Gen:  70 y.o. male in NAD HEENT: NCAT, MMM, EOMI, PERRL, anicteric sclerae, no conjunctival pallor CV: RRR, no MRG, no JVD Resp: Non-labored, CTAB, no wheezes noted Abd: Soft, NTND, BS present, no guarding or organomegaly Ext: WWP, no edema MSK: No obvious deformities Neuro: Alert and oriented, speech normal      Chemistry      Component Value Date/Time   NA 138 03/21/2015 0659   K 3.7 03/21/2015 0659   CL 109 03/21/2015 0659   CO2 23 03/21/2015 0659   BUN 10 03/21/2015 0659   CREATININE 1.23 03/21/2015 0659   CREATININE 1.30 03/18/2014 1234      Component Value Date/Time   CALCIUM 8.1* 03/21/2015 0659   ALKPHOS 82 03/17/2015 0609   AST 24 03/17/2015 0609   ALT 30 03/17/2015 0609   BILITOT 0.5 03/17/2015 0609      Lab Results  Component Value Date   WBC 6.3 03/22/2015   HGB 8.4*  04/05/2015   HCT 23.2* 03/22/2015   MCV 89.9 03/22/2015   PLT 139* 03/22/2015   Lab Results  Component Value Date   TSH 1.148 03/16/2015   Lab Results  Component Value Date   HGBA1C 6.8* 01/15/2015   Assessment & Plan:     Brian Burns is a 70 y.o. male here for  HYPERTENSION, BENIGN SYSTEMIC Well-controlled today Check CMET, A1c F/u in 3 months  Type 2 diabetes mellitus without complication, without long-term current use of insulin (HCC) Diet controlled a1c today Discussed decreased carb intake F/u in 3 months  Weight loss, unintentional HH RN ordered  Acute blood loss anemia No further bleeding Continue iron supplementation 3 times a day for now Repeat CBC today Consider decreasing iron supplementation to daily  Physical deconditioning HH RN ordered     Erasmo Downer, MD MPH PGY-2,  [facility] 04/26/2015  3:54 PM

## 2015-04-26 NOTE — Assessment & Plan Note (Signed)
Diet controlled a1c today Discussed decreased carb intake F/u in 3 months

## 2015-04-26 NOTE — Patient Instructions (Signed)
Nice to see you again today. I have ordered the face-to-face for home health so that they should continue. We are getting some labs today and someone will call you or send you a letter with the results when they're available. Continue taking iron 3 times a day for now. If you have trouble with constipation while taking the iron, by some miralax over-the-counter and use it daily.  Come back to see me in 2-3 months to follow-up on diabetes and cholesterol.  Take care, Dr. Leonard Schwartz

## 2015-04-26 NOTE — Assessment & Plan Note (Signed)
HH RN ordered

## 2015-04-26 NOTE — Assessment & Plan Note (Signed)
HH RN ordered 

## 2015-04-26 NOTE — Assessment & Plan Note (Signed)
No further bleeding Continue iron supplementation 3 times a day for now Repeat CBC today Consider decreasing iron supplementation to daily

## 2015-04-27 ENCOUNTER — Telehealth: Payer: Self-pay | Admitting: Family Medicine

## 2015-04-27 ENCOUNTER — Encounter: Payer: Self-pay | Admitting: Family Medicine

## 2015-04-27 DIAGNOSIS — I714 Abdominal aortic aneurysm, without rupture: Secondary | ICD-10-CM | POA: Diagnosis not present

## 2015-04-27 DIAGNOSIS — I739 Peripheral vascular disease, unspecified: Secondary | ICD-10-CM | POA: Diagnosis not present

## 2015-04-27 DIAGNOSIS — I129 Hypertensive chronic kidney disease with stage 1 through stage 4 chronic kidney disease, or unspecified chronic kidney disease: Secondary | ICD-10-CM | POA: Diagnosis not present

## 2015-04-27 DIAGNOSIS — N401 Enlarged prostate with lower urinary tract symptoms: Secondary | ICD-10-CM | POA: Diagnosis not present

## 2015-04-27 DIAGNOSIS — N189 Chronic kidney disease, unspecified: Secondary | ICD-10-CM | POA: Diagnosis not present

## 2015-04-27 LAB — COMPLETE METABOLIC PANEL WITH GFR
ALBUMIN: 3.8 g/dL (ref 3.6–5.1)
ALK PHOS: 88 U/L (ref 40–115)
ALT: 16 U/L (ref 9–46)
AST: 17 U/L (ref 10–35)
BILIRUBIN TOTAL: 0.4 mg/dL (ref 0.2–1.2)
BUN: 20 mg/dL (ref 7–25)
CO2: 24 mmol/L (ref 20–31)
Calcium: 8.4 mg/dL — ABNORMAL LOW (ref 8.6–10.3)
Chloride: 107 mmol/L (ref 98–110)
Creat: 1.33 mg/dL — ABNORMAL HIGH (ref 0.70–1.25)
GFR, EST NON AFRICAN AMERICAN: 55 mL/min — AB (ref >=60)
GFR, Est African American: 63 mL/min (ref >=60)
GLUCOSE: 99 mg/dL (ref 65–99)
Potassium: 4 mmol/L (ref 3.5–5.3)
SODIUM: 138 mmol/L (ref 135–146)
TOTAL PROTEIN: 6.5 g/dL (ref 6.1–8.1)

## 2015-05-02 ENCOUNTER — Ambulatory Visit: Payer: Commercial Managed Care - HMO | Admitting: Podiatry

## 2015-05-04 DIAGNOSIS — I739 Peripheral vascular disease, unspecified: Secondary | ICD-10-CM | POA: Diagnosis not present

## 2015-05-04 DIAGNOSIS — N401 Enlarged prostate with lower urinary tract symptoms: Secondary | ICD-10-CM | POA: Diagnosis not present

## 2015-05-04 DIAGNOSIS — I714 Abdominal aortic aneurysm, without rupture: Secondary | ICD-10-CM | POA: Diagnosis not present

## 2015-05-04 DIAGNOSIS — N189 Chronic kidney disease, unspecified: Secondary | ICD-10-CM | POA: Diagnosis not present

## 2015-05-04 DIAGNOSIS — I129 Hypertensive chronic kidney disease with stage 1 through stage 4 chronic kidney disease, or unspecified chronic kidney disease: Secondary | ICD-10-CM | POA: Diagnosis not present

## 2015-05-08 ENCOUNTER — Ambulatory Visit (INDEPENDENT_AMBULATORY_CARE_PROVIDER_SITE_OTHER): Payer: Commercial Managed Care - HMO | Admitting: *Deleted

## 2015-05-08 DIAGNOSIS — I4892 Unspecified atrial flutter: Secondary | ICD-10-CM

## 2015-05-08 LAB — POCT INR: INR: 4

## 2015-05-09 ENCOUNTER — Ambulatory Visit (INDEPENDENT_AMBULATORY_CARE_PROVIDER_SITE_OTHER): Payer: Commercial Managed Care - HMO | Admitting: Podiatry

## 2015-05-09 DIAGNOSIS — B351 Tinea unguium: Secondary | ICD-10-CM

## 2015-05-09 DIAGNOSIS — I739 Peripheral vascular disease, unspecified: Secondary | ICD-10-CM

## 2015-05-09 DIAGNOSIS — E119 Type 2 diabetes mellitus without complications: Secondary | ICD-10-CM

## 2015-05-09 DIAGNOSIS — M79676 Pain in unspecified toe(s): Secondary | ICD-10-CM | POA: Diagnosis not present

## 2015-05-09 NOTE — Progress Notes (Signed)
Patient ID: Brian Burns, male   DOB: 03/13/1945, 70 y.o.   MRN: [id number] Complaint:  Visit Type: Patient returns to my office for continued preventative foot care services. Complaint: Patient states" my nails have grown long and thick and become painful to walk and wear shoes" Patient has been diagnosed with borderline diabetic with PVD and peripheral neuropathy.. He presents for preventative foot care services. No changes to ROS  Podiatric Exam: Vascular: dorsalis pedis and posterior tibial pulses are palpable bilateral. Capillary return is immediate. Temperature gradient is WNL. Skin turgor WNL  Sensorium: Normal Semmes Weinstein monofilament test. Normal tactile sensation bilaterally. Nail Exam: Pt has thick disfigured discolored nails with subungual debris noted bilateral entire nail hallux through fifth toenails Ulcer Exam: There is no evidence of ulcer or pre-ulcerative changes or infection. Orthopedic Exam: Muscle tone and strength are WNL. No limitations in general ROM. No crepitus or effusions noted. Foot type and digits show no abnormalities. Bony prominences are unremarkable. Skin: No Porokeratosis. No infection or ulcers  Diagnosis:  Tinea unguium, Pain in right toe, pain in left toes  Treatment & Plan Procedures and Treatment: Consent by patient was obtained for treatment procedures. The patient understood the discussion of treatment and procedures well. All questions were answered thoroughly reviewed. Debridement of mycotic and hypertrophic toenails, 1 through 5 bilateral and clearing of subungual debris. No ulceration, no infection noted.  Return Visit-Office Procedure: Patient instructed to return to the office for a follow up visit 3 months for continued evaluation and treatment.  Marquis Diles DPM 

## 2015-05-15 ENCOUNTER — Other Ambulatory Visit: Payer: Self-pay | Admitting: *Deleted

## 2015-05-15 ENCOUNTER — Telehealth: Payer: Self-pay | Admitting: Family Medicine

## 2015-05-15 NOTE — Telephone Encounter (Signed)
Needs referral for eye drs appt next week- April 26 Drs name is  Dr Oswaldo Done [address] [phone]

## 2015-05-16 MED ORDER — CITALOPRAM HYDROBROMIDE 20 MG PO TABS: 20.0000 mg | ORAL_TABLET | Freq: Every day | ORAL | Status: DC

## 2015-05-16 NOTE — Telephone Encounter (Signed)
[employer] referral has already been made to Dr. Ouida Sills office on 04/17/15 with 6 visits approved.

## 2015-05-17 DIAGNOSIS — N189 Chronic kidney disease, unspecified: Secondary | ICD-10-CM | POA: Diagnosis not present

## 2015-05-22 ENCOUNTER — Ambulatory Visit (INDEPENDENT_AMBULATORY_CARE_PROVIDER_SITE_OTHER): Payer: Commercial Managed Care - HMO | Admitting: *Deleted

## 2015-05-22 DIAGNOSIS — I4892 Unspecified atrial flutter: Secondary | ICD-10-CM

## 2015-05-22 DIAGNOSIS — R350 Frequency of micturition: Secondary | ICD-10-CM | POA: Diagnosis not present

## 2015-05-22 DIAGNOSIS — N99113 Postprocedural anterior urethral stricture: Secondary | ICD-10-CM | POA: Diagnosis not present

## 2015-05-22 DIAGNOSIS — Z0000 Encounter for general adult medical examination without abnormal findings: Secondary | ICD-10-CM | POA: Diagnosis not present

## 2015-05-22 DIAGNOSIS — N390 Urinary tract infection, site not specified: Secondary | ICD-10-CM | POA: Diagnosis not present

## 2015-05-22 DIAGNOSIS — N359 Urethral stricture, unspecified: Secondary | ICD-10-CM | POA: Diagnosis not present

## 2015-05-22 LAB — POCT INR: INR: 3.7

## 2015-05-23 ENCOUNTER — Other Ambulatory Visit: Payer: Self-pay | Admitting: *Deleted

## 2015-05-23 MED ORDER — FINASTERIDE 5 MG PO TABS: 5.0000 mg | ORAL_TABLET | Freq: Every day | ORAL | Status: DC

## 2015-05-24 DIAGNOSIS — Z961 Presence of intraocular lens: Secondary | ICD-10-CM | POA: Diagnosis not present

## 2015-05-24 DIAGNOSIS — H40033 Anatomical narrow angle, bilateral: Secondary | ICD-10-CM | POA: Diagnosis not present

## 2015-05-24 DIAGNOSIS — H26492 Other secondary cataract, left eye: Secondary | ICD-10-CM | POA: Diagnosis not present

## 2015-05-24 DIAGNOSIS — H26491 Other secondary cataract, right eye: Secondary | ICD-10-CM | POA: Diagnosis not present

## 2015-06-05 ENCOUNTER — Ambulatory Visit (INDEPENDENT_AMBULATORY_CARE_PROVIDER_SITE_OTHER): Payer: Commercial Managed Care - HMO | Admitting: *Deleted

## 2015-06-05 DIAGNOSIS — I4892 Unspecified atrial flutter: Secondary | ICD-10-CM

## 2015-06-05 LAB — POCT INR: INR: 3.9

## 2015-06-19 ENCOUNTER — Ambulatory Visit (INDEPENDENT_AMBULATORY_CARE_PROVIDER_SITE_OTHER): Payer: Commercial Managed Care - HMO | Admitting: *Deleted

## 2015-06-19 DIAGNOSIS — I4892 Unspecified atrial flutter: Secondary | ICD-10-CM

## 2015-06-19 LAB — POCT INR: INR: 2.8

## 2015-06-20 DIAGNOSIS — R3121 Asymptomatic microscopic hematuria: Secondary | ICD-10-CM | POA: Diagnosis not present

## 2015-06-29 DIAGNOSIS — Z1211 Encounter for screening for malignant neoplasm of colon: Secondary | ICD-10-CM | POA: Diagnosis not present

## 2015-06-29 DIAGNOSIS — Q2733 Arteriovenous malformation of digestive system vessel: Secondary | ICD-10-CM | POA: Diagnosis not present

## 2015-06-29 DIAGNOSIS — T18128D Food in esophagus causing other injury, subsequent encounter: Secondary | ICD-10-CM | POA: Diagnosis not present

## 2015-06-29 DIAGNOSIS — K219 Gastro-esophageal reflux disease without esophagitis: Secondary | ICD-10-CM | POA: Diagnosis not present

## 2015-07-04 ENCOUNTER — Ambulatory Visit (INDEPENDENT_AMBULATORY_CARE_PROVIDER_SITE_OTHER): Payer: Commercial Managed Care - HMO | Admitting: Internal Medicine

## 2015-07-04 ENCOUNTER — Ambulatory Visit (INDEPENDENT_AMBULATORY_CARE_PROVIDER_SITE_OTHER): Payer: Commercial Managed Care - HMO | Admitting: *Deleted

## 2015-07-04 ENCOUNTER — Encounter: Payer: Self-pay | Admitting: Internal Medicine

## 2015-07-04 VITALS — BP 112/66 | HR 61 | Temp 97.9°F | Ht 76.0 in | Wt 217.0 lb

## 2015-07-04 DIAGNOSIS — I4892 Unspecified atrial flutter: Secondary | ICD-10-CM

## 2015-07-04 DIAGNOSIS — M545 Low back pain, unspecified: Secondary | ICD-10-CM

## 2015-07-04 DIAGNOSIS — G8929 Other chronic pain: Secondary | ICD-10-CM | POA: Diagnosis not present

## 2015-07-04 LAB — POCT INR: INR: 1.5

## 2015-07-04 MED ORDER — BACLOFEN 10 MG PO TABS: 10.0000 mg | ORAL_TABLET | Freq: Three times a day (TID) | ORAL | Status: DC | PRN

## 2015-07-04 MED ORDER — TRAMADOL HCL 50 MG PO TABS: 50.0000 mg | ORAL_TABLET | Freq: Three times a day (TID) | ORAL | Status: DC | PRN

## 2015-07-04 NOTE — Assessment & Plan Note (Signed)
Lumbar back pain chronic in nature, no radiculopathy, no red flags, likely musculoskeletal in nature. Does have hx of AAA, however no hx consistent with leaking of AAA, no pulsatile mass on abdominal exam (increased due to being on Warfarin)  - Will provide Baclofen 10 mg TID prn, Tramadol for break through back pain - 2 years since screening for AAA, will need to follow up with PCP for abdominal U/S

## 2015-07-04 NOTE — Progress Notes (Signed)
   [facility] Brian Chars, MD Phone: [phone]  Reason For Visit: Same day, Back Pain   #  - Lumbar back pain back - Ongoing for about 5 years.  - Intermittent back pain  - Pain worse with standing, vacuuming. - Improves with rest, when sitting and laying down  - No radiation of pain  - No hx of trauma  -  No urinary incontience and bowel incotience, no numbness -  No fevers or chills  -  No weight loss, in fact gaining weight -  No nighttime awakenings with back pain  -  Received Tramadol in December for back pain  -  Not taking any tylenol -  Does not have a hx of take steroids  -  No hx of cancer  -  Does have a known distal AAA measuring 3.4 cm  -  However no abdominal pain radiating to the back, no new changes in type of back pain patient has had    Past Medical History Reviewed problem list.  Medications- reviewed and updated No additions to family history Social history- patient is a former smoker  Objective: BP 112/66 mmHg  Pulse 61  Temp(Src) 97.9 F (36.6 C) (Oral)  Ht 6\' 4"  (1.93 m)  Wt 217 lb (98.431 kg)  BMI 26.43 kg/m2 Gen: NAD, alert, cooperative with exam Abd: SNTND, BS present, no pulsatile mass noted on exam  Ext: No tenderness on palpation of the spinal processes, no tenderness on palpation of paraspinal muscles,  Negative straight leg test,  Skin: no rashes no lesions  Assessment/Plan: See problem based a/p  BACK PAIN, LUMBAR, CHRONIC Lumbar back pain chronic in nature, no radiculopathy, no red flags, likely musculoskeletal in nature. Does have hx of AAA, however no hx consistent with leaking of AAA, no pulsatile mass on abdominal exam (increased due to being on Warfarin)  - Will provide Baclofen 10 mg TID prn, Tramadol for break through back pain - 2 years since screening for AAA, will need to follow up with PCP for abdominal U/S

## 2015-07-04 NOTE — Patient Instructions (Signed)
Please follow up for back pain as needed. Provide tramadol and baclofen for back pain.

## 2015-07-05 ENCOUNTER — Other Ambulatory Visit: Payer: Self-pay | Admitting: *Deleted

## 2015-07-05 DIAGNOSIS — I714 Abdominal aortic aneurysm, without rupture, unspecified: Secondary | ICD-10-CM

## 2015-07-05 DIAGNOSIS — I739 Peripheral vascular disease, unspecified: Secondary | ICD-10-CM

## 2015-07-06 ENCOUNTER — Encounter: Payer: Self-pay | Admitting: Family

## 2015-07-07 ENCOUNTER — Encounter: Payer: Self-pay | Admitting: Family Medicine

## 2015-07-07 ENCOUNTER — Ambulatory Visit (INDEPENDENT_AMBULATORY_CARE_PROVIDER_SITE_OTHER): Payer: Commercial Managed Care - HMO | Admitting: *Deleted

## 2015-07-07 ENCOUNTER — Ambulatory Visit (INDEPENDENT_AMBULATORY_CARE_PROVIDER_SITE_OTHER): Payer: Commercial Managed Care - HMO | Admitting: Family Medicine

## 2015-07-07 VITALS — BP 129/87 | HR 48 | Temp 98.0°F | Ht 76.0 in | Wt 217.0 lb

## 2015-07-07 DIAGNOSIS — G8929 Other chronic pain: Secondary | ICD-10-CM

## 2015-07-07 DIAGNOSIS — I714 Abdominal aortic aneurysm, without rupture, unspecified: Secondary | ICD-10-CM

## 2015-07-07 DIAGNOSIS — M545 Low back pain: Secondary | ICD-10-CM

## 2015-07-07 DIAGNOSIS — I4892 Unspecified atrial flutter: Secondary | ICD-10-CM

## 2015-07-07 LAB — POCT INR: INR: 2.2

## 2015-07-07 NOTE — Assessment & Plan Note (Signed)
Improving No red flags Continue tramadol and baclofen prn F/u prn

## 2015-07-07 NOTE — Progress Notes (Signed)
   Subjective:   Brian Burns is a 70 y.o. male with a history of T2 DM, AAA, PAD, HTN, paroxysmal A. fib here for back pain f/u  Back pain - was seen 6/6 by Dr. Cathlean Cower - taking Tramadol BID which has helped pain tremendously - afraid to take Baclofen because it will make him sleepy - denies any fevers, chills, night sweats, weight loss, numbness, weakness, incontinence - reports back pain is improving greatly  AA - last [place] in 2015 - followed by [facility] - appt next week - thinks they are getting repeat US  Review of Systems:  Per HPI.   Social History: former smoker  Objective:  BP 129/87 mmHg  Pulse 48  Temp(Src) 98 F (36.7 C) (Oral)  Ht 6\' 4"  (1.93 m)  Wt 217 lb (98.431 kg)  BMI 26.43 kg/m2  Gen:  70 y.o. male in NAD, Brian HEENT: NCAT, MMM, EOMI, PERRL, anicteric sclerae Ext: WWP, no edema MSK: Back: Full ROM, strength intact in LEs, sensation intact, no TTP over spinous processes, neg straight leg raise b/l Neuro: Alert and oriented, walks with cane     Assessment & Plan:     Brian Burns is a 70 y.o. male here for  BACK PAIN, LUMBAR, CHRONIC Improving No red flags Continue tramadol and baclofen prn F/u prn  AAA (abdominal aortic aneurysm) without rupture Followed by [facility] with upcoming ultrasounds Will f/u if not followed by them     Erasmo Downer, MD MPH PGY-2,  [facility] 07/07/2015  9:36 AM

## 2015-07-07 NOTE — Assessment & Plan Note (Signed)
Followed by [facility] with upcoming ultrasounds Will f/u if not followed by them

## 2015-07-07 NOTE — Patient Instructions (Addendum)
Nice to see you again today. You can use the tramadol and baclofen as needed for your back pain. I'm glad is doing better.  Please ask your vascular doctors when you see them next week about whether you up-to-date on screening for your abdominal aortic aneurysm (AAA).  Please make a follow-up to see me about your blood pressure in one month  Please make a follow-up for Medicare annual wellness visit with Neysa Bonito or Lauren when it is convenient.  Take care, Dr. Leonard Schwartz

## 2015-07-10 ENCOUNTER — Other Ambulatory Visit: Payer: Self-pay | Admitting: *Deleted

## 2015-07-11 MED ORDER — PANTOPRAZOLE SODIUM 40 MG PO TBEC: 40.0000 mg | DELAYED_RELEASE_TABLET | Freq: Two times a day (BID) | ORAL | Status: DC

## 2015-07-11 MED ORDER — WARFARIN SODIUM 10 MG PO TABS: 10.0000 mg | ORAL_TABLET | Freq: Every day | ORAL | Status: DC

## 2015-07-13 ENCOUNTER — Ambulatory Visit (INDEPENDENT_AMBULATORY_CARE_PROVIDER_SITE_OTHER): Payer: Commercial Managed Care - HMO | Admitting: Family

## 2015-07-13 ENCOUNTER — Encounter: Payer: Self-pay | Admitting: Family

## 2015-07-13 ENCOUNTER — Ambulatory Visit (HOSPITAL_COMMUNITY)
Admission: RE | Admit: 2015-07-13 | Discharge: 2015-07-13 | Disposition: A | Discharge status: Home / Self Care | Payer: Commercial Managed Care - HMO | Source: Ambulatory Visit | Attending: Family | Admitting: Family

## 2015-07-13 VITALS — BP 141/86 | HR 40 | Temp 97.2°F | Resp 18 | Ht 76.0 in | Wt 211.0 lb

## 2015-07-13 DIAGNOSIS — I2510 Atherosclerotic heart disease of native coronary artery without angina pectoris: Secondary | ICD-10-CM | POA: Insufficient documentation

## 2015-07-13 DIAGNOSIS — I739 Peripheral vascular disease, unspecified: Secondary | ICD-10-CM | POA: Insufficient documentation

## 2015-07-13 DIAGNOSIS — K219 Gastro-esophageal reflux disease without esophagitis: Secondary | ICD-10-CM | POA: Diagnosis not present

## 2015-07-13 DIAGNOSIS — I714 Abdominal aortic aneurysm, without rupture, unspecified: Secondary | ICD-10-CM

## 2015-07-13 DIAGNOSIS — I10 Essential (primary) hypertension: Secondary | ICD-10-CM | POA: Insufficient documentation

## 2015-07-13 DIAGNOSIS — E785 Hyperlipidemia, unspecified: Secondary | ICD-10-CM | POA: Diagnosis not present

## 2015-07-13 DIAGNOSIS — E119 Type 2 diabetes mellitus without complications: Secondary | ICD-10-CM | POA: Diagnosis not present

## 2015-07-13 DIAGNOSIS — R0989 Other specified symptoms and signs involving the circulatory and respiratory systems: Secondary | ICD-10-CM | POA: Diagnosis present

## 2015-07-13 NOTE — Progress Notes (Signed)
[facility] [place] HISTORY AND PHYSICAL   MRN : [id number]  History of Present Illness:   Brian Burns is a 70 y.o. male patient of Dr. Darrick Penna whom we are following for mild peripheral arterial disease as well as abdominal aortic aneurysm. He was referred to Dr. Shon Baton about 2015 for back pain. He states his back pain did improve a little bit with an injection. He is following up with Dr. Shon Baton on as-needed basis. He denies any abdominal pain. He states that he also has some lower back pain that eases a little with laying down, otherwise, nothing alleviates the pain. He denies claudication sx's in his legs with walking.  He does not smoke and has a remote tobacco history. He does have hx of CVA x 2 in 2010 and 2013 with no residual. His symptoms are very similar to his previous office visit in June of 2013. He denies any subsequent strokes or TIA.    He does not describe rest pain. He has no wounds on his feet. Chronic medical problems include hypertension, hyperlipidemia, chronic back pain, coronary disease of which are currently stable.   His walking seems limited by arthritis pain in both knees, uses a cane. He denies issues with falling.  Dr. Darrick Penna last saw pt on. 07/07/14. At that time pt had complaints of bilateral thigh pain and lower back pain. His ABI's were essentially unchanged from his ABI's 3 years prior. The pain he described is not consistent with claudication. Small abdominal aortic aneurysm unchanged over the previous year.  He was hospitalized at [facility] in February 2017 for GI bleed, then SNF. He denies chest pain, denies dyspnea.   Pt Diabetic: No, 5.4 A1C in March 2017 (review of records) Pt smoker: former smoker, quit about 2002  Pt meds include: Statin :Yes Betablocker: No ASA: Yes Other anticoagulants/antiplatelets: warfarin, hx of atrial fib and atrial flutter  Current Outpatient Prescriptions  Medication Sig Dispense Refill  .  amLODipine (NORVASC) 10 MG tablet Take 1 tablet (10 mg total) by mouth daily. 90 tablet 1  . aspirin EC 81 MG tablet Take 81 mg by mouth daily.    Marland Kitchen atorvastatin (LIPITOR) 40 MG tablet Take 1 tablet (40 mg total) by mouth daily. 90 tablet 3  . citalopram (CELEXA) 20 MG tablet Take 1 tablet (20 mg total) by mouth daily at 6 PM. 30 tablet 3  . ferrous sulfate 325 (65 FE) MG tablet Take 1 tablet (325 mg total) by mouth 3 (three) times daily with meals. 90 tablet 3  . finasteride (PROSCAR) 5 MG tablet Take 1 tablet (5 mg total) by mouth daily. 30 tablet 1  . gabapentin (NEURONTIN) 100 MG capsule Take 1 capsule (100 mg total) by mouth 3 (three) times daily. 180 capsule 2  . ondansetron (ZOFRAN) 4 MG tablet Take 1 tablet (4 mg total) by mouth every 6 (six) hours as needed for nausea or vomiting. 12 tablet 0  . pantoprazole (PROTONIX) 40 MG tablet Take 1 tablet (40 mg total) by mouth 2 (two) times daily. 60 tablet 2  . phenazopyridine (PYRIDIUM) 200 MG tablet Take 1 tablet (200 mg total) by mouth 3 (three) times daily as needed for pain. 10 tablet 0  . sucralfate (CARAFATE) 1 G tablet Take 1 tablet (1 g total) by mouth 3 (three) times daily as needed. (Patient taking differently: Take 1 g by mouth 3 (three) times daily as needed (Nausea/vomitting). ) 30 tablet 0  . traMADol (ULTRAM)  50 MG tablet Take 1 tablet (50 mg total) by mouth every 8 (eight) hours as needed. 30 tablet 0  . warfarin (COUMADIN) 10 MG tablet Take 1 tablet (10 mg total) by mouth daily. 30 tablet 2  . warfarin (COUMADIN) 2.5 MG tablet Take 2.5mg  tablet in addition to  tablet (for total 12.5mg ) on Tues/Thur/Sun. Take  on other days of the week. 30 tablet 2  . baclofen (LIORESAL) 10 MG tablet Take 1 tablet (10 mg total) by mouth 3 (three) times daily as needed for muscle spasms (For back pain). 30 each 0  . lipase/protease/amylase (CREON) 12000 units CPEP capsule Take 1 capsule (12,000 Units total) by mouth 3 (three) times daily with  meals. (Patient not taking: Reported on 07/13/2015) 270 capsule 0  . tamsulosin (FLOMAX) 0.4 MG CAPS capsule Take 1 capsule (0.4 mg total) by mouth daily. 30 capsule 3   No current facility-administered medications for this visit.    Past Medical History  Diagnosis Date  . Hypertension   . HLD (hyperlipidemia)   . Erectile dysfunction   . Chronic back pain   . CAD (coronary artery disease)   . GERD (gastroesophageal reflux disease)   . Arthritis     knees,lower back  . Carotid artery disease (HCC)   . PVD (peripheral vascular disease) with claudication (HCC)   . AAA (abdominal aortic aneurysm) without rupture (HCC) 06/2014    no change in last exam from 2015  . History of CVA (cerebrovascular accident)     2009  &  2013  . Type 2 diabetes mellitus (HCC)     no meds per pt  . Atrial flutter with rapid ventricular response (HCC) 01/16/2015  . Atrial fibrillation (HCC)   . Idiopathic pancreatitis 12/2014    acute    Social History Social History  Substance Use Topics  . Smoking status: Former Smoker -- 0.50 packs/day for 40 years    Types: Cigarettes    Quit date: 01/29/2004  . Smokeless tobacco: Never Used  . Alcohol Use: No    Family History Family History  Problem Relation Age of Onset  . Lung disease Brother   . Kidney disease Brother   . Heart disease Brother     died at 14  . Heart disease Mother   . Kidney disease Mother   . Lung disease Mother     Surgical History Past Surgical History  Procedure Laterality Date  . Esophagogastroduodenoscopy N/A 05/15/2014    Procedure: ESOPHAGOGASTRODUODENOSCOPY (EGD);  Surgeon: Carman Ching, MD;  Location: [facility] ENDOSCOPY;  Service: Endoscopy;  Laterality: N/A;  . Cataract extraction w/ intraocular lens  implant, bilateral  april and may 2014  . Transthoracic echocardiogram  01-07-2012    mild to moderate dilated LV,  ef 45-50%,  mild basal hypokinesis,  grade I diastolic  dysfunction/  trivial AR/  mild MR  .  Cardiovascular stress test  08-09-2009    mild global hypokinesis,  no ischemia or scar/  ef 41%  . Cystoscopy with urethral dilatation N/A 11/24/2014    Procedure: CYSTOSCOPY WITH URETHRAL DILATATION;  Surgeon: Crist Fat, MD;  Location: [facility];  Service: Urology;  Laterality: N/A;  BALLOON DILATION        . Cystoscopy with retrograde urethrogram N/A 11/24/2014    Procedure: CYSTOSCOPY WITH RETROGRADE URETHROGRAM;  Surgeon: Crist Fat, MD;  Location: [facility];  Service: Urology;  Laterality: N/A;  . Esophagogastroduodenoscopy Left 02/02/2015    Procedure:  ESOPHAGOGASTRODUODENOSCOPY (EGD);  Surgeon: Dorena Cookey, MD;  Location: [facility] ENDOSCOPY;  Service: Endoscopy;  Laterality: Left;  . Esophagogastroduodenoscopy Left 03/16/2015    Procedure: ESOPHAGOGASTRODUODENOSCOPY (EGD);  Surgeon: Dorena Cookey, MD;  Location: [facility] ENDOSCOPY;  Service: Endoscopy;  Laterality: Left;    Allergies  Allergen Reactions  . Novocain [Procaine Hcl] Nausea And Vomiting  . Atorvastatin     On MAR  . Calcium Carbonate     On MAR  . Doxycycline     On MAR    Current Outpatient Prescriptions  Medication Sig Dispense Refill  . amLODipine (NORVASC) 10 MG tablet Take 1 tablet (10 mg total) by mouth daily. 90 tablet 1  . aspirin EC 81 MG tablet Take 81 mg by mouth daily.    Marland Kitchen atorvastatin (LIPITOR) 40 MG tablet Take 1 tablet (40 mg total) by mouth daily. 90 tablet 3  . citalopram (CELEXA) 20 MG tablet Take 1 tablet (20 mg total) by mouth daily at 6 PM. 30 tablet 3  . ferrous sulfate 325 (65 FE) MG tablet Take 1 tablet (325 mg total) by mouth 3 (three) times daily with meals. 90 tablet 3  . finasteride (PROSCAR) 5 MG tablet Take 1 tablet (5 mg total) by mouth daily. 30 tablet 1  . gabapentin (NEURONTIN) 100 MG capsule Take 1 capsule (100 mg total) by mouth 3 (three) times daily. 180 capsule 2  . ondansetron (ZOFRAN) 4 MG tablet Take 1 tablet (4 mg total) by mouth  every 6 (six) hours as needed for nausea or vomiting. 12 tablet 0  . pantoprazole (PROTONIX) 40 MG tablet Take 1 tablet (40 mg total) by mouth 2 (two) times daily. 60 tablet 2  . phenazopyridine (PYRIDIUM) 200 MG tablet Take 1 tablet (200 mg total) by mouth 3 (three) times daily as needed for pain. 10 tablet 0  . sucralfate (CARAFATE) 1 G tablet Take 1 tablet (1 g total) by mouth 3 (three) times daily as needed. (Patient taking differently: Take 1 g by mouth 3 (three) times daily as needed (Nausea/vomitting). ) 30 tablet 0  . traMADol (ULTRAM) 50 MG tablet Take 1 tablet (50 mg total) by mouth every 8 (eight) hours as needed. 30 tablet 0  . warfarin (COUMADIN) 10 MG tablet Take 1 tablet (10 mg total) by mouth daily. 30 tablet 2  . warfarin (COUMADIN) 2.5 MG tablet Take 2.5mg  tablet in addition to 10mg  tablet (for total 12.5mg ) on Tues/Thur/Sun. Take 10mg  on other days of the week. 30 tablet 2  . baclofen (LIORESAL) 10 MG tablet Take 1 tablet (10 mg total) by mouth 3 (three) times daily as needed for muscle spasms (For back pain). 30 each 0  . lipase/protease/amylase (CREON) 12000 units CPEP capsule Take 1 capsule (12,000 Units total) by mouth 3 (three) times daily with meals. (Patient not taking: Reported on 07/13/2015) 270 capsule 0  . tamsulosin (FLOMAX) 0.4 MG CAPS capsule Take 1 capsule (0.4 mg total) by mouth daily. 30 capsule 3   No current facility-administered medications for this visit.     REVIEW OF SYSTEMS: See HPI for pertinent positives and negatives.  Physical Examination Filed Vitals:   07/13/15 0926 07/13/15 0932  BP: 144/79 141/86  Pulse: 57 40  Temp: 97.2 F (36.2 C)   Resp: 18   Height: 6\' 4"  (1.93 m)   Weight: 211 lb (95.709 kg)   SpO2: 100%    Body mass index is 25.69 kg/(m^2).  General:  WDWN in NAD Gait: Normal HENT: WNL  Eyes: Pupils equal Pulmonary: normal non-labored breathing, no rales, rhonchi, or wheezing Cardiac: RRR, no murmur detected  Abdomen: soft,  NT, no masses palpated Skin: no rashes, no ulcers, no cellulitis.  VASCULAR EXAM  Carotid Bruits Right Left   Negative Negative   Aorta is not palpable Radial pulses are 2+ palpable and =                          VASCULAR EXAM: Extremities no ischemic changes, no Gangrene; no open wounds.                                                                                                          LE Pulses Right Left       FEMORAL  4+ palpable  4+ palpable        POPLITEAL  not palpable   not palpable       POSTERIOR TIBIAL  not palpable   not palpable        DORSALIS PEDIS      ANTERIOR TIBIAL not palpable  not palpable     Musculoskeletal: no muscle wasting or atrophy; no peripheral edema  Neurologic: A&O X 3; Appropriate Affect ;  SENSATION: normal; MOTOR FUNCTION: 5/5 Symmetric, CN 2-12 intact Speech is edentulous    Non-Invasive Vascular Imaging (07/13/2015):  AAA Duplex: Patent AAA measuring 2.89 x 3.07 cm (previous: 3.12 cm, 07/07/14) Right CIA: 1.86 cm Left CIA: 2.19 cm  ABI: Right: 1.13 (1.02, 07/07/14), waveforms: bi and monophasic, TBI: 0.78 Left: 0.89 (0.86), waveforms: bi and monophasic, TBI: 0.68    ASSESSMENT:  Brian Burns is a 70 y.o. male who has chronic back pain from spine issues, no new back pain, no abdominal pain. He has no claudication sx's with walking which is limited by back pain/spine issues and arthritis/joint pain, no signs of ischemia in his feet/legs.  AAA duplex today indicates no change in size of small AAA and no increase in diameters of bilateral iliac artery dilations in a year's time.  ABI's remain stable with normal right LE and mild arterial occlusive disease in left LE.    PLAN:   Graduated walking program discussed and how to achieve.  Based on today's exam and non-invasive vascular lab results, the patient will follow up in 1 year with the following tests: AAA duplex and ABI's. I discussed in depth with the patient the  nature of atherosclerosis, and emphasized the importance of maximal medical management including strict control of blood pressure, blood glucose, and lipid levels, obtaining regular exercise, and cessation of smoking.  The patient is aware that without maximal medical management the underlying atherosclerotic disease process will progress, limiting the benefit of any interventions. Consideration for repair of AAA would be made when the size approaches 4.8 or 5.0 cm, growth > 1 cm/yr, and symptomatic status.  The patient was given information about AAA including signs, symptoms, treatment,  what symptoms should prompt the patient to seek immediate medical care, and how to minimize the risk of enlargement  and rupture of aneurysms. The patient was given information about PAD including signs, symptoms, treatment, what symptoms should prompt the patient to seek immediate medical care, and risk reduction measures to take. Thank you for allowing [place] to participate in this patient's care.  Charisse March, RN, MSN, FNP-C [facility] Office: [phone]  Clinic MD: [facility]  07/13/2015 9:50 AM

## 2015-07-13 NOTE — Patient Instructions (Signed)
Abdominal Aortic Aneurysm An aneurysm is a weakened or damaged part of an artery wall that bulges from the normal force of blood pumping through the body. An abdominal aortic aneurysm is an aneurysm that occurs in the lower part of the aorta, the main artery of the body.  The major concern with an abdominal aortic aneurysm is that it can enlarge and burst (rupture) or blood can flow between the layers of the wall of the aorta through a tear (aorticdissection). Both of these conditions can cause bleeding inside the body and can be life threatening unless diagnosed and treated promptly. CAUSES  The exact cause of an abdominal aortic aneurysm is unknown. Some contributing factors are:   A hardening of the arteries caused by the buildup of fat and other substances in the lining of a blood vessel (arteriosclerosis).  Inflammation of the walls of an artery (arteritis).   Connective tissue diseases, such as Marfan syndrome.   Abdominal trauma.   An infection, such as syphilis or staphylococcus, in the wall of the aorta (infectious aortitis) caused by bacteria. RISK FACTORS  Risk factors that contribute to an abdominal aortic aneurysm may include:  Age older than 60 years.   High blood pressure (hypertension).  Male gender.  Ethnicity (white race).  Obesity.  Family history of aneurysm (first degree relatives only).  Tobacco use. PREVENTION  The following healthy lifestyle habits may help decrease your risk of abdominal aortic aneurysm:  Quitting smoking. Smoking can raise your blood pressure and cause arteriosclerosis.  Limiting or avoiding alcohol.  Keeping your blood pressure, blood sugar level, and cholesterol levels within normal limits.  Decreasing your salt intake. In somepeople, too much salt can raise blood pressure and increase your risk of abdominal aortic aneurysm.  Eating a diet low in saturated fats and cholesterol.  Increasing your fiber intake by including  whole grains, vegetables, and fruits in your diet. Eating these foods may help lower blood pressure.  Maintaining a healthy weight.  Staying physically active and exercising regularly. SYMPTOMS  The symptoms of abdominal aortic aneurysm may vary depending on the size and rate of growth of the aneurysm.Most grow slowly and do not have any symptoms. When symptoms do occur, they may include:  Pain (abdomen, side, lower back, or groin). The pain may vary in intensity. A sudden onset of severe pain may indicate that the aneurysm has ruptured.  Feeling full after eating only small amounts of food.  Nausea or vomiting or both.  Feeling a pulsating lump in the abdomen.  Feeling faint or passing out. DIAGNOSIS  Since most unruptured abdominal aortic aneurysms have no symptoms, they are often discovered during diagnostic exams for other conditions. An aneurysm may be found during the following procedures:  Ultrasonography (A one-time screening for abdominal aortic aneurysm by ultrasonography is also recommended for all men aged 65-75 years who have ever smoked).  X-ray exams.  A computed tomography (CT).  Magnetic resonance imaging (MRI).  Angiography or arteriography. TREATMENT  Treatment of an abdominal aortic aneurysm depends on the size of your aneurysm, your age, and risk factors for rupture. Medication to control blood pressure and pain may be used to manage aneurysms smaller than 6 cm. Regular monitoring for enlargement may be recommended by your caregiver if:  The aneurysm is 3-4 cm in size (an annual ultrasonography may be recommended).  The aneurysm is 4-4.5 cm in size (an ultrasonography every 6 months may be recommended).  The aneurysm is larger than 4.5 cm in   size (your caregiver may ask that you be examined by a vascular surgeon). If your aneurysm is larger than 6 cm, surgical repair may be recommended. There are two main methods for repair of an aneurysm:   Endovascular  repair (a minimally invasive surgery). This is done most often.  Open repair. This method is used if an endovascular repair is not possible.   This information is not intended to replace advice given to you by your health care provider. Make sure you discuss any questions you have with your health care provider.   Document Released: 10/24/2004 Document Revised: 05/11/2012 Document Reviewed: 02/14/2012 Elsevier Interactive Patient Education 2016 [employer]     Peripheral Vascular Disease Peripheral vascular disease (PVD) is a disease of the blood vessels that are not part of your heart and brain. A simple term for PVD is poor circulation. In most cases, PVD narrows the blood vessels that carry blood from your heart to the rest of your body. This can result in a decreased supply of blood to your arms, legs, and internal organs, like your stomach or kidneys. However, it most often affects a person's lower legs and feet. There are two types of PVD.  Organic PVD. This is the more common type. It is caused by damage to the structure of blood vessels.  Functional PVD. This is caused by conditions that make blood vessels contract and tighten (spasm). Without treatment, PVD tends to get worse over time. PVD can also lead to acute ischemic limb. This is when an arm or limb suddenly has trouble getting enough blood. This is a medical emergency. CAUSES Each type of PVD has many different causes. The most common cause of PVD is buildup of a fatty material (plaque) inside of your arteries (atherosclerosis). Small amounts of plaque can break off from the walls of the blood vessels and become lodged in a smaller artery. This blocks blood flow and can cause acute ischemic limb. Other common causes of PVD include:  Blood clots that form inside of blood vessels.  Injuries to blood vessels.  Diseases that cause inflammation of blood vessels or cause blood vessel spasms.  Health behaviors and health  history that increase your risk of developing PVD. RISK FACTORS  You may have a greater risk of PVD if you:  Have a family history of PVD.  Have certain medical conditions, including:  High cholesterol.  Diabetes.  High blood pressure (hypertension).  Coronary heart disease.  Past problems with blood clots.  Past injury, such as burns or a broken bone. These may have damaged blood vessels in your limbs.  Buerger disease. This is caused by inflamed blood vessels in your hands and feet.  Some forms of arthritis.  Rare birth defects that affect the arteries in your legs.  Use tobacco.  Do not get enough exercise.  Are obese.  Are age 50 or older. SIGNS AND SYMPTOMS  PVD may cause many different symptoms. Your symptoms depend on what part of your body is not getting enough blood. Some common signs and symptoms include:  Cramps in your lower legs. This may be a symptom of poor leg circulation (claudication).  Pain and weakness in your legs while you are physically active that goes away when you rest (intermittent claudication).  Leg pain when at rest.  Leg numbness, tingling, or weakness.  Coldness in a leg or foot, especially when compared with the other leg.  Skin or hair changes. These can include:  Hair loss.    Shiny skin.  Pale or bluish skin.  Thick toenails.  Inability to get or maintain an erection (erectile dysfunction). People with PVD are more prone to developing ulcers and sores on their toes, feet, or legs. These may take longer than normal to heal. DIAGNOSIS Your health care provider may diagnose PVD from your signs and symptoms. The health care provider will also do a physical exam. You may have tests to find out what is causing your PVD and determine its severity. Tests may include:  Blood pressure recordings from your arms and legs and measurements of the strength of your pulses (pulse volume recordings).  Imaging studies using sound waves to  take pictures of the blood flow through your blood vessels (Doppler ultrasound).  Injecting a dye into your blood vessels before having imaging studies using:  X-rays (angiogram or arteriogram).  Computer-generated X-rays (CT angiogram).  A powerful electromagnetic field and a computer (magnetic resonance angiogram or MRA). TREATMENT Treatment for PVD depends on the cause of your condition and the severity of your symptoms. It also depends on your age. Underlying causes need to be treated and controlled. These include long-lasting (chronic) conditions, such as diabetes, high cholesterol, and high blood pressure. You may need to first try making lifestyle changes and taking medicines. Surgery may be needed if these do not work. Lifestyle changes may include:  Quitting smoking.  Exercising regularly.  Following a low-fat, low-cholesterol diet. Medicines may include:  Blood thinners to prevent blood clots.  Medicines to improve blood flow.  Medicines to improve your blood cholesterol levels. Surgical procedures may include:  A procedure that uses an inflated balloon to open a blocked artery and improve blood flow (angioplasty).  A procedure to put in a tube (stent) to keep a blocked artery open (stent implant).  Surgery to reroute blood flow around a blocked artery (peripheral bypass surgery).  Surgery to remove dead tissue from an infected wound on the affected limb.  Amputation. This is surgical removal of the affected limb. This may be necessary in cases of acute ischemic limb that are not improved through medical or surgical treatments. HOME CARE INSTRUCTIONS  Take medicines only as directed by your health care provider.  Do not use any tobacco products, including cigarettes, chewing tobacco, or electronic cigarettes. If you need help quitting, ask your health care provider.  Lose weight if you are overweight, and maintain a healthy weight as directed by your health care  provider.  Eat a diet that is low in fat and cholesterol. If you need help, ask your health care provider.  Exercise regularly. Ask your health care provider to suggest some good activities for you.  Use compression stockings or other mechanical devices as directed by your health care provider.  Take good care of your feet.  Wear comfortable shoes that fit well.  Check your feet often for any cuts or sores. SEEK MEDICAL CARE IF:  You have cramps in your legs while walking.  You have leg pain when you are at rest.  You have coldness in a leg or foot.  Your skin changes.  You have erectile dysfunction.  You have cuts or sores on your feet that are not healing. SEEK IMMEDIATE MEDICAL CARE IF:  Your arm or leg turns cold and blue.  Your arms or legs become red, warm, swollen, painful, or numb.  You have chest pain or trouble breathing.  You suddenly have weakness in your face, arm, or leg.  You become very confused   or lose the ability to speak.  You suddenly have a very bad headache or lose your vision.   This information is not intended to replace advice given to you by your health care provider. Make sure you discuss any questions you have with your health care provider.   Document Released: 02/22/2004 Document Revised: 02/04/2014 Document Reviewed: 06/24/2013 Elsevier Interactive Patient Education 2016 [employer]  

## 2015-07-13 NOTE — Progress Notes (Signed)
Filed Vitals:   07/13/15 0926 07/13/15 0932  BP: 144/79 141/86  Pulse: 57 40  Temp: 97.2 F (36.2 C)   Resp: 18   Height: 6\' 4"  (1.93 m)   Weight: 211 lb (95.709 kg)   SpO2: 100%

## 2015-07-20 ENCOUNTER — Ambulatory Visit (INDEPENDENT_AMBULATORY_CARE_PROVIDER_SITE_OTHER): Payer: Commercial Managed Care - HMO | Admitting: *Deleted

## 2015-07-20 ENCOUNTER — Encounter: Payer: Self-pay | Admitting: *Deleted

## 2015-07-20 VITALS — BP 126/56 | HR 63 | Temp 98.0°F | Ht 76.0 in | Wt 216.4 lb

## 2015-07-20 DIAGNOSIS — I4892 Unspecified atrial flutter: Secondary | ICD-10-CM

## 2015-07-20 DIAGNOSIS — E119 Type 2 diabetes mellitus without complications: Secondary | ICD-10-CM

## 2015-07-20 DIAGNOSIS — Z1159 Encounter for screening for other viral diseases: Secondary | ICD-10-CM | POA: Diagnosis not present

## 2015-07-20 DIAGNOSIS — E785 Hyperlipidemia, unspecified: Secondary | ICD-10-CM | POA: Diagnosis not present

## 2015-07-20 DIAGNOSIS — Z8619 Personal history of other infectious and parasitic diseases: Secondary | ICD-10-CM

## 2015-07-20 DIAGNOSIS — R5382 Chronic fatigue, unspecified: Secondary | ICD-10-CM | POA: Diagnosis not present

## 2015-07-20 DIAGNOSIS — Z0000 Encounter for general adult medical examination without abnormal findings: Secondary | ICD-10-CM

## 2015-07-20 DIAGNOSIS — R5381 Other malaise: Secondary | ICD-10-CM | POA: Diagnosis not present

## 2015-07-20 DIAGNOSIS — Z23 Encounter for immunization: Secondary | ICD-10-CM | POA: Diagnosis not present

## 2015-07-20 LAB — POCT INR: INR: 2.8

## 2015-07-20 LAB — LIPID PANEL
Cholesterol: 167 mg/dL (ref 125–200)
HDL: 51 mg/dL (ref >=40)
LDL CALC: 90 mg/dL (ref <130)
TRIGLYCERIDES: 129 mg/dL (ref <150)
Total CHOL/HDL Ratio: 3.3 Ratio (ref <=5.0)
VLDL: 26 mg/dL (ref <30)

## 2015-07-20 MED ORDER — ZOSTAVAX 19400 UNT/0.65ML ~~LOC~~ SOLR
0.6500 mL | Freq: Once | SUBCUTANEOUS | Status: DC
Start: 2015-07-20 — End: 2016-02-05

## 2015-07-20 NOTE — Patient Instructions (Signed)
Colonoscopy A colonoscopy is an exam to look at the entire large intestine (colon). This exam can help find problems such as tumors, polyps, inflammation, and areas of bleeding. The exam takes about 1 hour.  LET [facility] CARE PROVIDER KNOW ABOUT:   Any allergies you have.  All medicines you are taking, including vitamins, herbs, eye drops, creams, and over-the-counter medicines.  Previous problems you or members of your family have had with the use of anesthetics.  Any blood disorders you have.  Previous surgeries you have had.  Medical conditions you have. RISKS AND COMPLICATIONS  Generally, this is a safe procedure. However, as with any procedure, complications can occur. Possible complications include:  Bleeding.  Tearing or rupture of the colon wall.  Reaction to medicines given during the exam.  Infection (rare). BEFORE THE PROCEDURE   Ask your health care provider about changing or stopping your regular medicines.  You may be prescribed an oral bowel prep. This involves drinking a large amount of medicated liquid, starting the day before your procedure. The liquid will cause you to have multiple loose stools until your stool is almost clear or light green. This cleans out your colon in preparation for the procedure.  Do not eat or drink anything else once you have started the bowel prep, unless your health care provider tells you it is safe to do so.  Arrange for someone to drive you home after the procedure. PROCEDURE   You will be given medicine to help you relax (sedative).  You will lie on your side with your knees bent.  A long, flexible tube with a light and camera on the end (colonoscope) will be inserted through the rectum and into the colon. The camera sends video back to a computer screen as it moves through the colon. The colonoscope also releases carbon dioxide gas to inflate the colon. This helps your health care provider see the area better.  During  the exam, your health care provider may take a small tissue sample (biopsy) to be examined under a microscope if any abnormalities are found.  The exam is finished when the entire colon has been viewed. AFTER THE PROCEDURE   Do not drive for 24 hours after the exam.  You may have a small amount of blood in your stool.  You may pass moderate amounts of gas and have mild abdominal cramping or bloating. This is caused by the gas used to inflate your colon during the exam.  Ask when your test results will be ready and how you will get your results. Make sure you get your test results.   This information is not intended to replace advice given to you by your health care provider. Make sure you discuss any questions you have with your health care provider.   Document Released: 01/12/2000 Document Revised: 11/04/2012 Document Reviewed: 09/21/2012 Elsevier Interactive Patient Education 2016 [employer] Inc[employer]  Diabetes and Foot Care Diabetes may cause you to have problems because of poor blood supply (circulation) to your feet and legs. This may cause the skin on your feet to become thinner, break easier, and heal more slowly. Your skin may become dry, and the skin may peel and crack. You may also have nerve damage in your legs and feet causing decreased feeling in them. You may not notice minor injuries to your feet that could lead to infections or more serious problems. Taking care of your feet is one of the most important things you can do for  yourself.  HOME CARE INSTRUCTIONS  Wear shoes at all times, even in the house. Do not go barefoot. Bare feet are easily injured.  Check your feet daily for blisters, cuts, and redness. If you cannot see the bottom of your feet, use a mirror or ask someone for help.  Wash your feet with warm water (do not use hot water) and mild soap. Then pat your feet and the areas between your toes until they are completely dry. Do not soak your feet as this can dry your  skin.  Apply a moisturizing lotion or petroleum jelly (that does not contain alcohol and is unscented) to the skin on your feet and to dry, brittle toenails. Do not apply lotion between your toes.  Trim your toenails straight across. Do not dig under them or around the cuticle. File the edges of your nails with an emery board or nail file.  Do not cut corns or calluses or try to remove them with medicine.  Wear clean socks or stockings every day. Make sure they are not too tight. Do not wear knee-high stockings since they may decrease blood flow to your legs.  Wear shoes that fit properly and have enough cushioning. To break in new shoes, wear them for just a few hours a day. This prevents you from injuring your feet. Always look in your shoes before you put them on to be sure there are no objects inside.  Do not cross your legs. This may decrease the blood flow to your feet.  If you find a minor scrape, cut, or break in the skin on your feet, keep it and the skin around it clean and dry. These areas may be cleansed with mild soap and water. Do not cleanse the area with peroxide, alcohol, or iodine.  When you remove an adhesive bandage, be sure not to damage the skin around it.  If you have a wound, look at it several times a day to make sure it is healing.  Do not use heating pads or hot water bottles. They may burn your skin. If you have lost feeling in your feet or legs, you may not know it is happening until it is too late.  Make sure your health care provider performs a complete foot exam at least annually or more often if you have foot problems. Report any cuts, sores, or bruises to your health care provider immediately. SEEK MEDICAL CARE IF:   You have an injury that is not healing.  You have cuts or breaks in the skin.  You have an ingrown nail.  You notice redness on your legs or feet.  You feel burning or tingling in your legs or feet.  You have pain or cramps in your  legs and feet.  Your legs or feet are numb.  Your feet always feel cold. SEEK IMMEDIATE MEDICAL CARE IF:   There is increasing redness, swelling, or pain in or around a wound.  There is a red line that goes up your leg.  Pus is coming from a wound.  You develop a fever or as directed by your health care provider.  You notice a bad smell coming from an ulcer or wound.   This information is not intended to replace advice given to you by your health care provider. Make sure you discuss any questions you have with your health care provider.   Document Released: 01/12/2000 Document Revised: 09/16/2012 Document Reviewed: 06/23/2012 Elsevier Interactive Patient Education [employer]  Fall Prevention in the Home  Falls can cause injuries. They can happen to people of all ages. There are many things you can do to make your home safe and to help prevent falls.  WHAT CAN I DO ON THE OUTSIDE OF MY HOME?  Regularly fix the edges of walkways and driveways and fix any cracks.  Remove anything that might make you trip as you walk through a door, such as a raised step or threshold.  Trim any bushes or trees on the path to your home.  Use bright outdoor lighting.  Clear any walking paths of anything that might make someone trip, such as rocks or tools.  Regularly check to see if handrails are loose or broken. Make sure that both sides of any steps have handrails.  Any raised decks and porches should have guardrails on the edges.  Have any leaves, snow, or ice cleared regularly.  Use sand or salt on walking paths during winter.  Clean up any spills in your garage right away. This includes oil or grease spills. WHAT CAN I DO IN THE BATHROOM?   Use night lights.  Install grab bars by the toilet and in the tub and shower. Do not use towel bars as grab bars.  Use non-skid mats or decals in the tub or shower.  If you need to sit down in the shower, use a plastic, non-slip  stool.  Keep the floor dry. Clean up any water that spills on the floor as soon as it happens.  Remove soap buildup in the tub or shower regularly.  Attach bath mats securely with double-sided non-slip rug tape.  Do not have throw rugs and other things on the floor that can make you trip. WHAT CAN I DO IN THE BEDROOM?  Use night lights.  Make sure that you have a light by your bed that is easy to reach.  Do not use any sheets or blankets that are too big for your bed. They should not hang down onto the floor.  Have a firm chair that has side arms. You can use this for support while you get dressed.  Do not have throw rugs and other things on the floor that can make you trip. WHAT CAN I DO IN THE KITCHEN?  Clean up any spills right away.  Avoid walking on wet floors.  Keep items that you use a lot in easy-to-reach places.  If you need to reach something above you, use a strong step stool that has a grab bar.  Keep electrical cords out of the way.  Do not use floor polish or wax that makes floors slippery. If you must use wax, use non-skid floor wax.  Do not have throw rugs and other things on the floor that can make you trip. WHAT CAN I DO WITH MY STAIRS?  Do not leave any items on the stairs.  Make sure that there are handrails on both sides of the stairs and use them. Fix handrails that are broken or loose. Make sure that handrails are as long as the stairways.  Check any carpeting to make sure that it is firmly attached to the stairs. Fix any carpet that is loose or worn.  Avoid having throw rugs at the top or bottom of the stairs. If you do have throw rugs, attach them to the floor with carpet tape.  Make sure that you have a light switch at the top of the stairs and the bottom of the stairs.  If you do not have them, ask someone to add them for you. WHAT ELSE CAN I DO TO HELP PREVENT FALLS?  Wear shoes that:  Do not have high heels.  Have rubber bottoms.  Are  comfortable and fit you well.  Are closed at the toe. Do not wear sandals.  If you use a stepladder:  Make sure that it is fully opened. Do not climb a closed stepladder.  Make sure that both sides of the stepladder are locked into place.  Ask someone to hold it for you, if possible.  Clearly mark and make sure that you can see:  Any grab bars or handrails.  First and last steps.  Where the edge of each step is.  Use tools that help you move around (mobility aids) if they are needed. These include:  Canes.  Walkers.  Scooters.  Crutches.  Turn on the lights when you go into a dark area. Replace any light bulbs as soon as they burn out.  Set up your furniture so you have a clear path. Avoid moving your furniture around.  If any of your floors are uneven, fix them.  If there are any pets around you, be aware of where they are.  Review your medicines with your doctor. Some medicines can make you feel dizzy. This can increase your chance of falling. Ask your doctor what other things that you can do to help prevent falls.   This information is not intended to replace advice given to you by your health care provider. Make sure you discuss any questions you have with your health care provider.   Document Released: 11/10/2008 Document Revised: 05/31/2014 Document Reviewed: 02/18/2014 Elsevier Interactive Patient Education 2016 Elsevier Inc.  Glaucoma Glaucoma happens when the fluid pressure in the eyeball is too high. If the pressure stays high for too long, the eye may become damaged. This can cause a loss of vision. The most common type of glaucoma causes pressure in the eye to go up slowly. There may be no symptoms at first. Testing for this condition can help to find the condition before damage occurs. Early treatment can often stop vision loss. HOME CARE  Take medicines only as told by your doctor.  Use your eye drops exactly as told. You will probably need to use  these for the rest of your life.  Exercise often. Talk with your doctor about which types of exercise are safe for you. Avoid standing on your head.  Keep all follow-up visits as told by your doctor. This is important. GET HELP IF:  Your symptoms get worse. GET HELP RIGHT AWAY IF:  You have bad pain in your eye.  You have vision problems.  You have a bad headache in the area around your eye.  You feel sick to your stomach (nauseous) or you throw up (vomit).  You start to have problems with your other eye.   This information is not intended to replace advice given to you by your health care provider. Make sure you discuss any questions you have with your health care provider.   Document Released: 10/24/2007 Document Revised: 02/04/2014 Document Reviewed: 10/26/2013 Elsevier Interactive Patient Education 2016 [employer].  Health Maintenance, Male A healthy lifestyle and preventative care can promote health and wellness.  Maintain regular health, dental, and eye exams.  Eat a healthy diet. Foods like vegetables, fruits, whole grains, low-fat dairy products, and lean protein foods contain the nutrients you need and are low in calories.  Decrease your intake of foods high in solid fats, added sugars, and salt. Get information about a proper diet from your health care provider, if necessary.  Regular physical exercise is one of the most important things you can do for your health. Most adults should get at least 150 minutes of moderate-intensity exercise (any activity that increases your heart rate and causes you to sweat) each week. In addition, most adults need muscle-strengthening exercises on 2 or more days a week.   Maintain a healthy weight. The body mass index (BMI) is a screening tool to identify possible weight problems. It provides an estimate of body fat based on height and weight. Your health care provider can find your BMI and can help you achieve or maintain a healthy  weight. For males 20 years and older:  A BMI below 18.5 is considered underweight.  A BMI of 18.5 to 24.9 is normal.  A BMI of 25 to 29.9 is considered overweight.  A BMI of 30 and above is considered obese.  Maintain normal blood lipids and cholesterol by exercising and minimizing your intake of saturated fat. Eat a balanced diet with plenty of fruits and vegetables. Blood tests for lipids and cholesterol should begin at age 78 and be repeated every 5 years. If your lipid or cholesterol levels are high, you are over age 40, or you are at high risk for heart disease, you may need your cholesterol levels checked more frequently.Ongoing high lipid and cholesterol levels should be treated with medicines if diet and exercise are not working.  If you smoke, find out from your health care provider how to quit. If you do not use tobacco, do not start.  Lung cancer screening is recommended for adults aged 55-80 years who are at high risk for developing lung cancer because of a history of smoking. A yearly low-dose CT scan of the lungs is recommended for people who have at least a 30-pack-year history of smoking and are current smokers or have quit within the past 15 years. A pack year of smoking is smoking an average of 1 pack of cigarettes a day for 1 year (for example, a 30-pack-year history of smoking could mean smoking 1 pack a day for 30 years or 2 packs a day for 15 years). Yearly screening should continue until the smoker has stopped smoking for at least 15 years. Yearly screening should be stopped for people who develop a health problem that would prevent them from having lung cancer treatment.  If you choose to drink alcohol, do not have more than 2 drinks per day. One drink is considered to be 12 oz (360 mL) of beer, 5 oz (150 mL) of wine, or 1.5 oz (45 mL) of liquor.  Avoid the use of street drugs. Do not share needles with anyone. Ask for help if you need support or instructions about stopping  the use of drugs.  High blood pressure causes heart disease and increases the risk of stroke. High blood pressure is more likely to develop in:  People who have blood pressure in the end of the normal range (100-139/85-89 mm Hg).  People who are overweight or obese.  People who are African American.  If you are 43-82 years of age, have your blood pressure checked every 3-5 years. If you are 10 years of age or older, have your blood pressure checked every year. You should have your blood pressure measured twice--once when you are at a hospital or clinic, and once  when you are not at a hospital or clinic. Record the average of the two measurements. To check your blood pressure when you are not at a hospital or clinic, you can use:  An automated blood pressure machine at a pharmacy.  A home blood pressure monitor.  If you are 51-69 years old, ask your health care provider if you should take aspirin to prevent heart disease.  Diabetes screening involves taking a blood sample to check your fasting blood sugar level. This should be done once every 3 years after age 33 if you are at a normal weight and without risk factors for diabetes. Testing should be considered at a younger age or be carried out more frequently if you are overweight and have at least 1 risk factor for diabetes.  Colorectal cancer can be detected and often prevented. Most routine colorectal cancer screening begins at the age of 35 and continues through age 75. However, your health care provider may recommend screening at an earlier age if you have risk factors for colon cancer. On a yearly basis, your health care provider may provide home test kits to check for hidden blood in the stool. A small camera at the end of a tube may be used to directly examine the colon (sigmoidoscopy or colonoscopy) to detect the earliest forms of colorectal cancer. Talk to your health care provider about this at age 90 when routine screening begins. A  direct exam of the colon should be repeated every 5-10 years through age 34, unless early forms of precancerous polyps or small growths are found.  People who are at an increased risk for hepatitis B should be screened for this virus. You are considered at high risk for hepatitis B if:  You were born in a country where hepatitis B occurs often. Talk with your health care provider about which countries are considered high risk.  Your parents were born in a high-risk country and you have not received a shot to protect against hepatitis B (hepatitis B vaccine).  You have HIV or AIDS.  You use needles to inject street drugs.  You live with, or have sex with, someone who has hepatitis B.  You are a man who has sex with other men (MSM).  You get hemodialysis treatment.  You take certain medicines for conditions like cancer, organ transplantation, and autoimmune conditions.  Hepatitis C blood testing is recommended for all people born from [birth year] through 1963 and any individual with known risk factors for hepatitis C.  Healthy men should no longer receive prostate-specific antigen (PSA) blood tests as part of routine cancer screening. Talk to your health care provider about prostate cancer screening.  Testicular cancer screening is not recommended for adolescents or adult males who have no symptoms. Screening includes self-exam, a health care provider exam, and other screening tests. Consult with your health care provider about any symptoms you have or any concerns you have about testicular cancer.  Practice safe sex. Use condoms and avoid high-risk sexual practices to reduce the spread of sexually transmitted infections (STIs).  You should be screened for STIs, including gonorrhea and chlamydia if:  You are sexually active and are younger than 24 years.  You are older than 24 years, and your health care provider tells you that you are at risk for this type of infection.  Your sexual  activity has changed since you were last screened, and you are at an increased risk for chlamydia or gonorrhea. Ask your health care provider  if you are at risk.  If you are at risk of being infected with HIV, it is recommended that you take a prescription medicine daily to prevent HIV infection. This is called pre-exposure prophylaxis (PrEP). You are considered at risk if:  You are a man who has sex with other men (MSM).  You are a heterosexual man who is sexually active with multiple partners.  You take drugs by injection.  You are sexually active with a partner who has HIV.  Talk with your health care provider about whether you are at high risk of being infected with HIV. If you choose to begin PrEP, you should first be tested for HIV. You should then be tested every 3 months for as long as you are taking PrEP.  Use sunscreen. Apply sunscreen liberally and repeatedly throughout the day. You should seek shade when your shadow is shorter than you. Protect yourself by wearing long sleeves, pants, a wide-brimmed hat, and sunglasses year round whenever you are outdoors.  Tell your health care provider of new moles or changes in moles, especially if there is a change in shape or color. Also, tell your health care provider if a mole is larger than the size of a pencil eraser.  A one-time screening for abdominal aortic aneurysm (AAA) and surgical repair of large AAAs by ultrasound is recommended for men aged 65-75 years who are current or former smokers.  Stay current with your vaccines (immunizations).   This information is not intended to replace advice given to you by your health care provider. Make sure you discuss any questions you have with your health care provider.   Document Released: 07/13/2007 Document Revised: 02/04/2014 Document Reviewed: 06/11/2010 Elsevier Interactive Patient Education 2016 [employer].  Hearing Loss Hearing loss is a partial or total loss of the ability to  hear. This can be temporary or permanent, and it can happen in one or both ears. Hearing loss may be referred to as deafness. Medical care is necessary to treat hearing loss properly and to prevent the condition from getting worse. Your hearing may partially or completely come back, depending on what caused your hearing loss and how severe it is. In some cases, hearing loss is permanent. CAUSES Common causes of hearing loss include:   Too much wax in the ear canal.   Infection of the ear canal or middle ear.   Fluid in the middle ear.   Injury to the ear or surrounding area.   An object stuck in the ear.   Prolonged exposure to loud sounds, such as music.  Less common causes of hearing loss include:   Tumors in the ear.   Viral or bacterial infections, such as meningitis.   A hole in the eardrum (perforated eardrum).  Problems with the hearing nerve that sends signals between the brain and the ear.  Certain medicines.  SYMPTOMS  Symptoms of this condition may include:  Difficulty telling the difference between sounds.  Difficulty following a conversation when there is background noise.  Lack of response to sounds in your environment. This may be most noticeable when you do not respond to startling sounds.  Needing to turn up the volume on the television, radio, etc.  Ringing in the ears.  Dizziness.  Pain in the ears. DIAGNOSIS This condition is diagnosed based on a physical exam and a hearing test (audiometry). The audiometry test will be performed by a hearing specialist (audiologist). You may also be referred to an ear, nose,  and throat (ENT) specialist (otolaryngologist).  TREATMENT Treatment for recent onset of hearing loss may include:   Ear wax removal.   Being prescribed medicines to prevent infection (antibiotics).   Being prescribed medicines to reduce inflammation (corticosteroids).  HOME CARE INSTRUCTIONS  If you were prescribed an  antibiotic medicine, take it as told by your health care provider. Do not stop taking the antibiotic even if you start to feel better.  Take over-the-counter and prescription medicines only as told by your health care provider.  Avoid loud noises.   Return to your normal activities as told by your health care provider. Ask your health care provider what activities are safe for you.  Keep all follow-up visits as told by your health care provider. This is important. SEEK MEDICAL CARE IF:   You feel dizzy.   You develop new symptoms.   You vomit or feel nauseous.   You have a fever.  SEEK IMMEDIATE MEDICAL CARE IF:  You develop sudden changes in your vision.   You have severe ear pain.   You have new or increased weakness.  You have a severe headache.   This information is not intended to replace advice given to you by your health care provider. Make sure you discuss any questions you have with your health care provider.   Document Released: 01/14/2005 Document Revised: 10/05/2014 Document Reviewed: 06/01/2014 Elsevier Interactive Patient Education [employer]

## 2015-07-20 NOTE — Progress Notes (Signed)
Subjective:   Brian Burns is a 70 y.o. male who presents for an Initial Medicare Annual Wellness Visit.  Cardiac Risk Factors include: advanced age (>63men, >30 women);diabetes mellitus;dyslipidemia;hypertension;male gender;sedentary lifestyle    Objective:    Today's Vitals   07/20/15 1345  BP: 126/56  Pulse: 63  Temp: 98 F (36.7 C)  TempSrc: Oral  Height: 6\' 4"  (1.93 m)  Weight: 216 lb 6.4 oz (98.158 kg)  SpO2: 98%  PainSc: 5   PainLoc: Back   Body mass index is 26.35 kg/(m^2).  Current Medications (verified) Outpatient Encounter Prescriptions as of 07/20/2015  Medication Sig  . amLODipine (NORVASC) 10 MG tablet Take 1 tablet (10 mg total) by mouth daily.  Marland Kitchen atorvastatin (LIPITOR) 40 MG tablet Take 1 tablet (40 mg total) by mouth daily.  . baclofen (LIORESAL) 10 MG tablet Take 1 tablet (10 mg total) by mouth 3 (three) times daily as needed for muscle spasms (For back pain).  . ferrous sulfate 325 (65 FE) MG tablet Take 1 tablet (325 mg total) by mouth 3 (three) times daily with meals.  . gabapentin (NEURONTIN) 100 MG capsule Take 1 capsule (100 mg total) by mouth 3 (three) times daily.  . ondansetron (ZOFRAN) 4 MG tablet Take 1 tablet (4 mg total) by mouth every 6 (six) hours as needed for nausea or vomiting.  . pantoprazole (PROTONIX) 40 MG tablet Take 1 tablet (40 mg total) by mouth 2 (two) times daily.  . phenazopyridine (PYRIDIUM) 200 MG tablet Take 1 tablet (200 mg total) by mouth 3 (three) times daily as needed for pain.  Marland Kitchen sucralfate (CARAFATE) 1 G tablet Take 1 tablet (1 g total) by mouth 3 (three) times daily as needed. (Patient taking differently: Take 1 g by mouth 3 (three) times daily as needed (Nausea/vomitting). )  . tamsulosin (FLOMAX) 0.4 MG CAPS capsule Take 1 capsule (0.4 mg total) by mouth daily.  . traMADol (ULTRAM) 50 MG tablet Take 1 tablet (50 mg total) by mouth every 8 (eight) hours as needed.  . warfarin (COUMADIN) 2.5 MG tablet Take 2.5mg   tablet in addition to 10mg  tablet (for total 12.5mg ) on Tues/Thur/Sun. Take 10mg  on other days of the week.  Marland Kitchen aspirin EC 81 MG tablet Take 81 mg by mouth daily.  . citalopram (CELEXA) 20 MG tablet Take 1 tablet (20 mg total) by mouth daily at 6 PM.  . finasteride (PROSCAR) 5 MG tablet Take 1 tablet (5 mg total) by mouth daily.  . lipase/protease/amylase (CREON) 12000 units CPEP capsule Take 1 capsule (12,000 Units total) by mouth 3 (three) times daily with meals. (Patient not taking: Reported on 07/13/2015)  . warfarin (COUMADIN) 10 MG tablet Take 1 tablet (10 mg total) by mouth daily.   No facility-administered encounter medications on file as of 07/20/2015.    Allergies (verified) Novocain; Atorvastatin; Calcium carbonate; and Doxycycline   History: Past Medical History  Diagnosis Date  . Hypertension   . HLD (hyperlipidemia)   . Erectile dysfunction   . Chronic back pain   . CAD (coronary artery disease)   . GERD (gastroesophageal reflux disease)   . Arthritis     knees,lower back  . Carotid artery disease (HCC)   . PVD (peripheral vascular disease) with claudication (HCC)   . AAA (abdominal aortic aneurysm) without rupture (HCC) 06/2014    no change in last exam from 2015  . History of CVA (cerebrovascular accident)     2009  &  2013  .  Type 2 diabetes mellitus (HCC)     no meds per pt  . Atrial flutter with rapid ventricular response (HCC) 01/16/2015  . Atrial fibrillation (HCC)   . Idiopathic pancreatitis 12/2014    acute   Past Surgical History  Procedure Laterality Date  . Esophagogastroduodenoscopy N/A 05/15/2014    Procedure: ESOPHAGOGASTRODUODENOSCOPY (EGD);  Surgeon: Carman Ching, MD;  Location[facility] ENDOSCOPY;  Service: Endoscopy;  Laterality: N/A;  . Cataract extraction w/ intraocular lens  implant, bilateral  april and may 2014  . Transthoracic echocardiogram  01-07-2012    mild to moderate dilated LV,  ef 45-50%,  mild basal hypokinesis,  grade I diastolic   dysfunction/  trivial AR/  mild MR  . Cardiovascular stress test  08-09-2009    mild global hypokinesis,  no ischemia or scar/  ef 41%  . Cystoscopy with urethral dilatation N/A 11/24/2014    Procedure: CYSTOSCOPY WITH URETHRAL DILATATION;  Surgeon: Crist Fat, MD;  Location: [facility];  Service: Urology;  Laterality: N/A;  BALLOON DILATION        . Cystoscopy with retrograde urethrogram N/A 11/24/2014    Procedure: CYSTOSCOPY WITH RETROGRADE URETHROGRAM;  Surgeon: Crist Fat, MD;  Location: [facility];  Service: Urology;  Laterality: N/A;  . Esophagogastroduodenoscopy Left 02/02/2015    Procedure: ESOPHAGOGASTRODUODENOSCOPY (EGD);  Surgeon: Dorena Cookey, MD;  Location[facility] ENDOSCOPY;  Service: Endoscopy;  Laterality: Left;  . Esophagogastroduodenoscopy Left 03/16/2015    Procedure: ESOPHAGOGASTRODUODENOSCOPY (EGD);  Surgeon: Dorena Cookey, MD;  Location: [facility] ENDOSCOPY;  Service: Endoscopy;  Laterality: Left;   Family History  Problem Relation Age of Onset  . Lung disease Brother   . Kidney disease Brother   . Heart disease Brother     died at 5  . Heart disease Mother     died at 33  . Kidney disease Mother   . Lung disease Mother    Social History   Occupational History  .  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 [employer]     on the weekends   Social History Main Topics  . Smoking status: Former Smoker -- 0.50 packs/day for 40 years    Types: Cigarettes    Quit date: 01/29/2004  . Smokeless tobacco: Never Used  . Alcohol Use: No  . Drug Use: No  . Sexual Activity: Not Currently   Tobacco Counseling Counseling given: Yes   Activities of Daily Living In your present state of health, do you have any difficulty performing the following activities: 07/20/2015 03/18/2015  Hearing? N N  Vision? N N  Difficulty concentrating or making decisions? Y N  Walking or climbing stairs? N Y  Dressing or bathing? N Y  Doing  errands, shopping? - Chief Operating Officer and eating ? N -  Using the Toilet? N -  In the past six months, have you accidently leaked urine? Y -  Do you have problems with loss of bowel control? N -  Managing your Medications? Y -  Managing your Finances? Y -  Housekeeping or managing your Housekeeping? N -  Home Safety:  My home has a working smoke alarm:  Yes, as well as Government social research officer and carbon monoxide detector           My home throw rugs have been fastened down to the floor or removed:  Yes, fastened down  I have non-slip mats in the bathtub and shower:  Yes  All my home's stairs have railings or bannisters: Yes, single level home with 3-5 steps outdoor with railings on both sides         My home's floors, stairs and hallways are free from clutter, wires and cords:  Yes        Immunizations and Health Maintenance Immunization History  Administered Date(s) Administered  . Influenza,inj,Quad PF,36+ Mos 02/16/2013, 10/21/2013, 01/16/2015  . Td 05/29/2002   Health Maintenance Due  Topic Date Due  . Hepatitis C Screening  1945/10/04  . URINE MICROALBUMIN  02/18/1957  . ZOSTAVAX  02/19/2007  . PNA vac Low Risk Adult (1 of 2 - PCV13) 02/19/2012  . TETANUS/TDAP  05/28/2012  . COLONOSCOPY  10/28/2012  . LIPID PANEL  03/17/2014  . OPHTHALMOLOGY EXAM  07/29/2014  . FOOT EXAM  12/03/2014  Blood drawn for Hep C and lipid panel Urine for microalbumin done Patient will go to local pharmacy for zostavax and TDaP Prevnar 13 given Patient will contact GI to schedule colonoscopy Patient has eye exam scheduled for next week Foot exam completed  Diabetic Foot Exam - Simple   Simple Foot Form  Diabetic Foot exam was performed with the following findings:  Yes 07/20/2015  2:12 PM  Visual Inspection  No deformities, no ulcerations, no other skin breakdown bilaterally:  Yes  See comments:  Yes  Sensation Testing  Intact to touch and monofilament testing bilaterally:  Yes  Pulse  Check  Posterior Tibialis and Dorsalis pulse intact bilaterally:  Yes  Comments  Dry, flaky skin lower extremities and feet bilaterally      Patient Care Team: Erasmo Downer, MD as PCP - General (Family Medicine) Sherren Kerns, MD as Consulting Physician (Vascular Surgery) Helane Gunther, DPM as Consulting Physician (Podiatry)  Indicate any recent Medical Services you may have received from other than [facility] providers in the past year (date may be approximate).    Assessment:   This is a routine wellness examination for [facility].   Hearing/Vision screen  Hearing Screening   125Hz  250Hz  500Hz  1000Hz  2000Hz  4000Hz  8000Hz   Right ear:   40      Left ear:   40      Comments: Unable to detect sound at 1000, 2000, 4000 HZ Patient states he was given hearing aids but took them back. States, "I don't need them."  Dietary issues and exercise activities discussed: Current Exercise Habits: The patient does not participate in regular exercise at present  Goals    . Blood Pressure < 140/90    . Exercise 150 minutes per week (moderate activity)     With cane in 10-15 minute increments    . HEMOGLOBIN A1C < 7.0      Depression Screen PHQ 2/9 Scores 07/20/2015 07/04/2015 04/26/2015 11/11/2014  PHQ - 2 Score 0 0 0 0    Fall Risk Fall Risk  07/20/2015 07/04/2015 10/14/2014 09/08/2014 12/02/2013  Falls in the past year? No No No No No  Risk for fall due to : - - - - Impaired balance/gait    Cognitive Function: Mini-Cog passed with score 3/5  TUG Test:  Done in 21 seconds. Patient used both hands to push out of chair and to sit back down. Used slow, halting steps.  Patient did not bring cane to visit. Discussed importance of using cane for walking to decrease potential for falls. Patient stated understanding.  Screening Tests Health Maintenance  Topic Date Due  . Hepatitis C Screening  20-Mar-1945  .  URINE MICROALBUMIN  02/18/1957  . ZOSTAVAX  02/19/2007  . PNA vac Low Risk Adult (1 of  2 - PCV13) 02/19/2012  . TETANUS/TDAP  05/28/2012  . COLONOSCOPY  10/28/2012  . LIPID PANEL  03/17/2014  . OPHTHALMOLOGY EXAM  07/29/2014  . FOOT EXAM  12/03/2014  . INFLUENZA VACCINE  08/29/2015  . HEMOGLOBIN A1C  10/27/2015        Plan:    During the course of the visit Samy was educated and counseled about the following appropriate screening and preventive services:   Vaccines to include Pneumoccal, Influenza, Td, Zostavax  Colorectal cancer screening  Cardiovascular disease screening  Diabetes screening  Glaucoma screening   Patient Instructions (the written plan) were given to the patient.   Fredderick Severance, RN   07/20/2015

## 2015-07-21 LAB — MICROALBUMIN / CREATININE URINE RATIO
CREATININE, URINE: 252 mg/dL (ref 20–370)
MICROALB UR: 9.3 mg/dL — AB
Microalb Creat Ratio: 37 mcg/mg creat — ABNORMAL HIGH (ref <30)

## 2015-07-21 LAB — HEPATITIS C ANTIBODY: HCV Ab: REACTIVE — AB

## 2015-07-21 NOTE — Progress Notes (Signed)
I have reviewed this visit and discussed with Alecia Lemming, RN, BSN, and agree with her documentation.  Erasmo Downer, MD, MPH PGY-2,  [facility] 07/21/2015 12:07 PM

## 2015-07-24 LAB — HEPATITIS C RNA QUANTITATIVE: HCV Quantitative: NOT DETECTED IU/mL (ref <15)

## 2015-07-26 ENCOUNTER — Telehealth: Payer: Self-pay | Admitting: Family Medicine

## 2015-07-26 DIAGNOSIS — R809 Proteinuria, unspecified: Secondary | ICD-10-CM | POA: Insufficient documentation

## 2015-07-26 MED ORDER — LISINOPRIL 5 MG PO TABS: 5.0000 mg | ORAL_TABLET | Freq: Every day | ORAL | Status: DC

## 2015-07-26 NOTE — Telephone Encounter (Signed)
Called patient to discuss recent lab work from [facility].  HCV Ab positive but viral load undetectable.  Discussed remote history now cleared.  Lipids ok, continue atorvastatin 40.  Microalbuminuria - will start lisinopril 5mg  daily.  F/u in 2 wks with me.  Erasmo Downer, MD, MPH PGY-2,  [facility] 07/26/2015 8:32 AM

## 2015-08-02 ENCOUNTER — Ambulatory Visit: Payer: Commercial Managed Care - HMO

## 2015-08-03 ENCOUNTER — Ambulatory Visit (INDEPENDENT_AMBULATORY_CARE_PROVIDER_SITE_OTHER): Payer: Commercial Managed Care - HMO | Admitting: *Deleted

## 2015-08-03 DIAGNOSIS — I4892 Unspecified atrial flutter: Secondary | ICD-10-CM | POA: Diagnosis not present

## 2015-08-03 LAB — POCT INR: INR: 4.2

## 2015-08-07 ENCOUNTER — Other Ambulatory Visit: Payer: Self-pay | Admitting: *Deleted

## 2015-08-07 MED ORDER — TAMSULOSIN HCL 0.4 MG PO CAPS: 0.4000 mg | ORAL_CAPSULE | Freq: Every day | ORAL | Status: DC

## 2015-08-07 MED ORDER — TRAMADOL HCL 50 MG PO TABS: 50.0000 mg | ORAL_TABLET | Freq: Three times a day (TID) | ORAL | Status: DC | PRN

## 2015-08-11 ENCOUNTER — Ambulatory Visit (INDEPENDENT_AMBULATORY_CARE_PROVIDER_SITE_OTHER): Payer: Commercial Managed Care - HMO | Admitting: *Deleted

## 2015-08-11 DIAGNOSIS — I4892 Unspecified atrial flutter: Secondary | ICD-10-CM | POA: Diagnosis not present

## 2015-08-11 LAB — POCT INR: INR: 3.3

## 2015-08-15 ENCOUNTER — Ambulatory Visit: Payer: Commercial Managed Care - HMO | Admitting: Podiatry

## 2015-08-17 ENCOUNTER — Ambulatory Visit (INDEPENDENT_AMBULATORY_CARE_PROVIDER_SITE_OTHER): Payer: Commercial Managed Care - HMO | Admitting: Podiatry

## 2015-08-17 ENCOUNTER — Encounter: Payer: Self-pay | Admitting: Podiatry

## 2015-08-17 DIAGNOSIS — E119 Type 2 diabetes mellitus without complications: Secondary | ICD-10-CM

## 2015-08-17 DIAGNOSIS — B351 Tinea unguium: Secondary | ICD-10-CM | POA: Diagnosis not present

## 2015-08-17 DIAGNOSIS — M79676 Pain in unspecified toe(s): Secondary | ICD-10-CM | POA: Diagnosis not present

## 2015-08-17 NOTE — Progress Notes (Signed)
Subjective:     Patient ID: Brian Burns, male   DOB: 1945-12-02, 70 y.o.   MRN: [id number]  HPI patient presents with nail disease 1-5 both feet that become incurvated and sore and making it hard to wear shoe gear comfortably   Review of Systems     Objective:   Physical Exam Neurovascular status intact muscle strength adequate with discomfort with thickness of nailbeds 1-5 of both feet    Assessment:     Mycotic nail infection with pain 1-5 both feet    Plan:     Debride painful nailbeds 1-5 both feet and reappoint 10 weeks for continued conservative procedure

## 2015-08-18 DIAGNOSIS — H40013 Open angle with borderline findings, low risk, bilateral: Secondary | ICD-10-CM | POA: Diagnosis not present

## 2015-08-25 ENCOUNTER — Ambulatory Visit (INDEPENDENT_AMBULATORY_CARE_PROVIDER_SITE_OTHER): Payer: Commercial Managed Care - HMO | Admitting: *Deleted

## 2015-08-25 DIAGNOSIS — I4892 Unspecified atrial flutter: Secondary | ICD-10-CM

## 2015-08-25 LAB — POCT INR: INR: 2.6

## 2015-08-27 ENCOUNTER — Other Ambulatory Visit: Payer: Self-pay | Admitting: Family Medicine

## 2015-09-07 ENCOUNTER — Other Ambulatory Visit: Payer: Self-pay | Admitting: *Deleted

## 2015-09-07 DIAGNOSIS — I739 Peripheral vascular disease, unspecified: Secondary | ICD-10-CM

## 2015-09-07 DIAGNOSIS — I714 Abdominal aortic aneurysm, without rupture, unspecified: Secondary | ICD-10-CM

## 2015-09-11 ENCOUNTER — Ambulatory Visit (INDEPENDENT_AMBULATORY_CARE_PROVIDER_SITE_OTHER): Payer: Commercial Managed Care - HMO | Admitting: *Deleted

## 2015-09-11 DIAGNOSIS — I4892 Unspecified atrial flutter: Secondary | ICD-10-CM | POA: Diagnosis not present

## 2015-09-11 LAB — POCT INR: INR: 2.2

## 2015-09-17 ENCOUNTER — Other Ambulatory Visit: Payer: Self-pay | Admitting: Family Medicine

## 2015-09-22 ENCOUNTER — Other Ambulatory Visit: Payer: Self-pay | Admitting: Family Medicine

## 2015-09-29 ENCOUNTER — Other Ambulatory Visit: Payer: Self-pay | Admitting: Family Medicine

## 2015-10-01 ENCOUNTER — Other Ambulatory Visit: Payer: Self-pay | Admitting: Family Medicine

## 2015-10-04 ENCOUNTER — Other Ambulatory Visit: Payer: Self-pay | Admitting: Family Medicine

## 2015-10-09 ENCOUNTER — Ambulatory Visit (INDEPENDENT_AMBULATORY_CARE_PROVIDER_SITE_OTHER): Payer: Commercial Managed Care - HMO | Admitting: *Deleted

## 2015-10-09 DIAGNOSIS — Z23 Encounter for immunization: Secondary | ICD-10-CM | POA: Diagnosis not present

## 2015-10-09 LAB — POCT INR: INR: 3

## 2015-10-15 ENCOUNTER — Other Ambulatory Visit: Payer: Self-pay | Admitting: Student

## 2015-10-17 ENCOUNTER — Ambulatory Visit (INDEPENDENT_AMBULATORY_CARE_PROVIDER_SITE_OTHER): Payer: Commercial Managed Care - HMO | Admitting: *Deleted

## 2015-10-17 DIAGNOSIS — I4892 Unspecified atrial flutter: Secondary | ICD-10-CM | POA: Diagnosis not present

## 2015-10-17 LAB — POCT INR: INR: 2.3

## 2015-10-25 ENCOUNTER — Other Ambulatory Visit: Payer: Self-pay | Admitting: Obstetrics and Gynecology

## 2015-10-31 ENCOUNTER — Ambulatory Visit (INDEPENDENT_AMBULATORY_CARE_PROVIDER_SITE_OTHER): Payer: Commercial Managed Care - HMO | Admitting: *Deleted

## 2015-10-31 ENCOUNTER — Other Ambulatory Visit: Payer: Self-pay | Admitting: *Deleted

## 2015-10-31 DIAGNOSIS — I4892 Unspecified atrial flutter: Secondary | ICD-10-CM | POA: Diagnosis not present

## 2015-10-31 LAB — POCT INR: INR: 2.6

## 2015-10-31 MED ORDER — GABAPENTIN 100 MG PO CAPS
ORAL_CAPSULE | ORAL | 2 refills | Status: DC
Start: 2015-10-31 — End: 2016-11-15

## 2015-10-31 MED ORDER — CITALOPRAM HYDROBROMIDE 20 MG PO TABS: ORAL_TABLET | ORAL | 3 refills | Status: DC

## 2015-10-31 MED ORDER — LISINOPRIL 5 MG PO TABS: 5.0000 mg | ORAL_TABLET | Freq: Every day | ORAL | 1 refills | Status: DC

## 2015-11-06 DIAGNOSIS — M62830 Muscle spasm of back: Secondary | ICD-10-CM | POA: Diagnosis not present

## 2015-11-06 DIAGNOSIS — M545 Low back pain: Secondary | ICD-10-CM | POA: Diagnosis not present

## 2015-11-06 DIAGNOSIS — M5136 Other intervertebral disc degeneration, lumbar region: Secondary | ICD-10-CM | POA: Diagnosis not present

## 2015-11-06 DIAGNOSIS — M6281 Muscle weakness (generalized): Secondary | ICD-10-CM | POA: Diagnosis not present

## 2015-11-08 ENCOUNTER — Other Ambulatory Visit: Payer: Self-pay | Admitting: Family Medicine

## 2015-11-10 ENCOUNTER — Ambulatory Visit (INDEPENDENT_AMBULATORY_CARE_PROVIDER_SITE_OTHER): Payer: Commercial Managed Care - HMO | Admitting: Podiatry

## 2015-11-10 ENCOUNTER — Encounter: Payer: Self-pay | Admitting: Podiatry

## 2015-11-10 VITALS — Ht 76.0 in | Wt 211.0 lb

## 2015-11-10 DIAGNOSIS — B351 Tinea unguium: Secondary | ICD-10-CM

## 2015-11-10 DIAGNOSIS — M79676 Pain in unspecified toe(s): Secondary | ICD-10-CM

## 2015-11-10 DIAGNOSIS — E119 Type 2 diabetes mellitus without complications: Secondary | ICD-10-CM | POA: Diagnosis not present

## 2015-11-10 NOTE — Progress Notes (Signed)
Patient ID: Brian Burns, male   DOB: May 27, 1945, 70 y.o.   MRN: [id number] Complaint:  Visit Type: Patient returns to my office for continued preventative foot care services. Complaint: Patient states" my nails have grown long and thick and become painful to walk and wear shoes" Patient has been diagnosed with borderline diabetic with PVD and peripheral neuropathy.Marland Kitchen He presents for preventative foot care services. No changes to ROS  Podiatric Exam: Vascular: dorsalis pedis and posterior tibial pulses are palpable bilateral. Capillary return is immediate. Temperature gradient is WNL. Skin turgor WNL  Sensorium: Normal Semmes Weinstein monofilament test. Normal tactile sensation bilaterally. Nail Exam: Pt has thick disfigured discolored nails with subungual debris noted bilateral entire nail hallux through fifth toenails Ulcer Exam: There is no evidence of ulcer or pre-ulcerative changes or infection. Orthopedic Exam: Muscle tone and strength are WNL. No limitations in general ROM. No crepitus or effusions noted. Foot type and digits show no abnormalities. Bony prominences are unremarkable. Skin: No Porokeratosis. No infection or ulcers  Diagnosis:  Tinea unguium, Pain in right toe, pain in left toes  Treatment & Plan Procedures and Treatment: Consent by patient was obtained for treatment procedures. The patient understood the discussion of treatment and procedures well. All questions were answered thoroughly reviewed. Debridement of mycotic and hypertrophic toenails, 1 through 5 bilateral and clearing of subungual debris. No ulceration, no infection noted.  Return Visit-Office Procedure: Patient instructed to return to the office for a follow up visit 3 months for continued evaluation and treatment.  Helane Gunther DPM

## 2015-11-14 ENCOUNTER — Ambulatory Visit: Payer: Commercial Managed Care - HMO | Admitting: Family Medicine

## 2015-11-14 ENCOUNTER — Ambulatory Visit (INDEPENDENT_AMBULATORY_CARE_PROVIDER_SITE_OTHER): Payer: Commercial Managed Care - HMO | Admitting: Student

## 2015-11-14 ENCOUNTER — Encounter: Payer: Self-pay | Admitting: Student

## 2015-11-14 VITALS — BP 101/58 | HR 72 | Temp 98.1°F | Wt 226.0 lb

## 2015-11-14 DIAGNOSIS — E119 Type 2 diabetes mellitus without complications: Secondary | ICD-10-CM | POA: Diagnosis not present

## 2015-11-14 DIAGNOSIS — R42 Dizziness and giddiness: Secondary | ICD-10-CM

## 2015-11-14 MED ORDER — MECLIZINE HCL 25 MG PO TABS: 25.0000 mg | ORAL_TABLET | Freq: Three times a day (TID) | ORAL | 1 refills | Status: DC | PRN

## 2015-11-14 NOTE — Progress Notes (Signed)
   Subjective:    Patient ID: Brian Burns, male    DOB: 04-12-1945, 70 y.o.   MRN: [id number]   CC: med request  HPI: 70 y/o male with PMH significant for CVA, transient dizziness presents for meclizine refill  Dizziness - has had occasional dizziness in past, last treated with meclizine but has since run out - he last had dizziness 2 weeks ago while mowing the lawn - this lasted for several minutes, then self resolved on its own - denies headache, SOB or chest pain associated - denies dizziness since that time  - he presents today because he is worried that he may again have dizzines and would like meclizine to use as needed  Smoking status reviewed  Review of Systems  Per HPI, else denies recent illness, fever, headache, changes in vision, chest pain, shortness of breath, abdominal pain, N/V/D, weakness    Objective:  BP (!) 101/58   Pulse 72   Temp 98.1 F (36.7 C) (Oral)   Wt 226 lb (102.5 kg)   SpO2 99%   BMI 27.51 kg/m  Vitals and nursing note reviewed  General: NAD Cardiac: RRR Respiratory: CTAB, normal effort Extremities: no edema or cyanosis. WWP. Skin: warm and dry, no rashes noted Neuro: CN II-XII intact, 5/5 strength in bilateral upper and lower extremities   Assessment & Plan:    Dizziness No symptoms today and previous symptoms resolved spontaneously. As he started having dizziness in the setting of activity, there may have been a dehydration component, however in past work up there has been concern for a post infarction sequelae given history of stroke.  - counseled for adequate hydration/rest as needed - meclizine prescribed - patient to follow with PCP in one week for dizziness resolution and for med review as he did not seem to know what medications he was on. His daughter handles his medication and puts it in a pill box    Danique Hartsough A. Kennon Rounds MD, MS Family Medicine Resident PGY-3 Pager [phone]

## 2015-11-14 NOTE — Patient Instructions (Signed)
Follow up in 1 week with PCP Please bring all of your medications with you to this visit If you have worsening dizziness, call the office or go to the ED If you have questions or concerns, call the office at [phone]

## 2015-11-14 NOTE — Progress Notes (Deleted)
Subjective:   Patient ID: Brian Burns    DOB: [birth year], 70 y.o. male   MRN: [id number]  CC: Dizziness  HPI: Brian Burns is a 70 y.o. male who presents to clinic today for feelings of diziness. Problems discussed today are as follows:  1. ***: ***  2. ***: ***  ROS: See HPI for pertinent ROS.  PMFSH: Pertinent past medical, surgical, family, and social history were reviewed and updated as appropriate. Smoking status reviewed.  Medications reviewed. Current Outpatient Prescriptions  Medication Sig Dispense Refill  . amLODipine (NORVASC) 10 MG tablet take 1 tablet by mouth once daily 90 tablet 0  . aspirin EC 81 MG tablet Take 81 mg by mouth daily.    Marland Kitchen. atorvastatin (LIPITOR) 40 MG tablet Take 1 tablet (40 mg total) by mouth daily. 90 tablet 3  . baclofen (LIORESAL) 10 MG tablet Take 1 tablet (10 mg total) by mouth 3 (three) times daily as needed for muscle spasms (For back pain). 30 each 0  . citalopram (CELEXA) 20 MG tablet take 1 tablet by mouth once daily AT 6PM 30 tablet 3  . CREON 12000 units CPEP capsule take 1 capsule by mouth three times a day WITH MEALS 270 capsule 3  . ferrous sulfate 325 (65 FE) MG tablet Take 1 tablet (325 mg total) by mouth 3 (three) times daily with meals. 90 tablet 3  . finasteride (PROSCAR) 5 MG tablet take 1 tablet by mouth once daily 30 tablet 1  . gabapentin (NEURONTIN) 100 MG capsule take 1 capsule by mouth three times a day 180 capsule 2  . lipase/protease/amylase (CREON) 12000 units CPEP capsule Take 1 capsule (12,000 Units total) by mouth 3 (three) times daily with meals. 270 capsule 0  . lisinopril (PRINIVIL,ZESTRIL) 5 MG tablet Take 1 tablet (5 mg total) by mouth daily. 30 tablet 1  . ondansetron (ZOFRAN) 4 MG tablet Take 1 tablet (4 mg total) by mouth every 6 (six) hours as needed for nausea or vomiting. 12 tablet 0  . pantoprazole (PROTONIX) 40 MG tablet take 1 tablet by mouth twice a day 60 tablet 2  . phenazopyridine (PYRIDIUM)  200 MG tablet Take 1 tablet (200 mg total) by mouth 3 (three) times daily as needed for pain. 10 tablet 0  . sucralfate (CARAFATE) 1 G tablet Take 1 tablet (1 g total) by mouth 3 (three) times daily as needed. (Patient taking differently: Take 1 g by mouth 3 (three) times daily as needed (Nausea/vomitting). ) 30 tablet 0  . tamsulosin (FLOMAX) 0.4 MG CAPS capsule Take 1 capsule (0.4 mg total) by mouth daily. 30 capsule 3  . traMADol (ULTRAM) 50 MG tablet Take 1 tablet (50 mg total) by mouth every 8 (eight) hours as needed. 30 tablet 1  . warfarin (COUMADIN) 10 MG tablet take 1 tablet by mouth once daily 30 tablet 2  . ZOSTAVAX [id number] UNT/0.65ML injection Inject 19,400 Units into the skin once. 0.65 mL 0   No current facility-administered medications for this visit.     Objective:   There were no vitals taken for this visit. Vitals and nursing note reviewed.  General: well nourished, well developed, in no acute distress with non-toxic appearance HEENT: normocephalic, atraumatic, moist mucous membranes Neck: supple, non-tender without lymphadenopathy CV: regular rate and rhythm without murmurs, rubs, or gallops Lungs: clear to auscultation bilaterally with normal work of breathing Abdomen: soft, non-tender, non-distended, no masses or organomegaly palpable, normoactive bowel sounds Skin: warm, dry, no  rashes or lesions, cap refill < 2 seconds Extremities: warm and well perfused, normal tone  Assessment & Plan:   No problem-specific Assessment & Plan notes found for this encounter.  No orders of the defined types were placed in this encounter.  No orders of the defined types were placed in this encounter.   Durward Parcel, DO [facility], PGY-1 11/14/2015 7:37 AM

## 2015-11-15 NOTE — Assessment & Plan Note (Signed)
No symptoms today and previous symptoms resolved spontaneously. As he started having dizziness in the setting of activity, there may have been a dehydration component, however in past work up there has been concern for a post infarction sequelae given history of stroke.  - counseled for adequate hydration/rest as needed - meclizine prescribed - patient to follow with PCP in one week for dizziness resolution and for med review as he did not seem to know what medications he was on. His daughter handles his medication and puts it in a pill box

## 2015-11-20 DIAGNOSIS — N35112 Postinfective bulbous urethral stricture, not elsewhere classified: Secondary | ICD-10-CM | POA: Diagnosis not present

## 2015-11-25 ENCOUNTER — Other Ambulatory Visit: Payer: Self-pay | Admitting: Family Medicine

## 2015-11-28 ENCOUNTER — Ambulatory Visit (INDEPENDENT_AMBULATORY_CARE_PROVIDER_SITE_OTHER): Payer: Commercial Managed Care - HMO | Admitting: *Deleted

## 2015-11-28 DIAGNOSIS — E119 Type 2 diabetes mellitus without complications: Secondary | ICD-10-CM

## 2015-11-28 LAB — POCT GLYCOSYLATED HEMOGLOBIN (HGB A1C): HEMOGLOBIN A1C: 6.4

## 2015-11-28 LAB — POCT INR: INR: 2.7

## 2015-12-21 ENCOUNTER — Other Ambulatory Visit: Payer: Self-pay | Admitting: Student

## 2015-12-22 ENCOUNTER — Other Ambulatory Visit: Payer: Self-pay | Admitting: Family Medicine

## 2015-12-26 ENCOUNTER — Ambulatory Visit (INDEPENDENT_AMBULATORY_CARE_PROVIDER_SITE_OTHER): Payer: Commercial Managed Care - HMO | Admitting: *Deleted

## 2015-12-26 DIAGNOSIS — I4892 Unspecified atrial flutter: Secondary | ICD-10-CM | POA: Diagnosis not present

## 2015-12-26 LAB — POCT INR: INR: 2.2

## 2016-01-02 ENCOUNTER — Other Ambulatory Visit: Payer: Self-pay | Admitting: *Deleted

## 2016-01-02 DIAGNOSIS — N189 Chronic kidney disease, unspecified: Secondary | ICD-10-CM

## 2016-01-02 NOTE — Progress Notes (Unsigned)
RE: [facility] Referral  Received: Today  Message Contents  Erasmo Downer, MD  Fredderick Severance, RN        Lauren,   That sounds great. Could you place the referral? I am on remote access on my phone today as I am offsite. Thanks!   Previous Messages    ----- Message -----  From: Fredderick Severance, RN  Sent: 01/01/2016  4:50 PM  To: Erasmo Downer, MD  Subject: [facility] Referral                   Dr. Beryle Flock,   I received a list from [facility] of Tier 4 High Risk patients that may benefit from [facility] involvement. This is one of those patients. If you agree please place referral for [facility] and Tia will get that started or I can place referral for you, just let me know. Thanks!   Leotis Shames

## 2016-01-04 ENCOUNTER — Other Ambulatory Visit: Payer: Self-pay

## 2016-01-04 NOTE — Patient Outreach (Addendum)
[facility]) Care Management  01/04/2016  Brian Burns November 22, 1945 [id number]     Telephone Screen  Referral Date: 01/03/16 Referral Source: MD [facility] Referral Reason: "tier 4-high risk patient, CKD"    Outreach attempt # 1 to patient. Spoke with patient and screening completed.   Social: Patient resided in his home along with his spouse. He denies any need for assistance with ADLs/IADLs. He states that he drives himself to medical appts. Denies any recent falls within the last year. DME in the home include cane that he seldom uses.    Conditions: Patient has PMH of stroke x2(last one in 2015), transient dizziness, HLD, HTN, GERD, erectile dysfunction, PVD, AAA w/out rupture and A-fib. Patient states that he is borderline diabetic. He is checking and monitoring his blood sugars daily. Blood sugar this am was 120 prior to eating per patient report. Patient does not monitor BP in the home but states it is controlled per readings and discussions during MD visits. CKD listed on referral as a diagnosis but patient reported he did not know anything about that.   Medications: Patient reports taking about 15 meds. Denies any issues with affordability. He reports that his dtr fills med planner weekly for him.   Appointments: Patient unsure of when next PCP appt. He reports he has appt with urologist(doesn't know name) on next week.   Advance Directives: None. Patient declined.   Consent: [facility] services reviewed and discussed. Patient denies having RN CM care coordination needs at this time. He was agreeable to RN health coach for further education and support in mgmt of chronic illnesses.    Plan: RN CM will notify [facility] administrative assistant of case status. RN CM will send health coach referral for further disease management and education.  RN CM will send [facility] pharmacy referral for polypharmacy med review.    Antionette Fairy,  RN,BSN,CCM [facility] Care Management Telephonic Care Management Coordinator Direct Phone: [phone] Toll Free: [phone] Fax: [phone]

## 2016-01-07 ENCOUNTER — Other Ambulatory Visit: Payer: Self-pay | Admitting: Family Medicine

## 2016-01-18 ENCOUNTER — Other Ambulatory Visit: Payer: Self-pay | Admitting: Family Medicine

## 2016-01-24 ENCOUNTER — Ambulatory Visit: Payer: Commercial Managed Care - HMO

## 2016-01-25 ENCOUNTER — Other Ambulatory Visit: Payer: Self-pay | Admitting: *Deleted

## 2016-01-25 ENCOUNTER — Ambulatory Visit (INDEPENDENT_AMBULATORY_CARE_PROVIDER_SITE_OTHER): Payer: Commercial Managed Care - HMO | Admitting: *Deleted

## 2016-01-25 ENCOUNTER — Encounter: Payer: Self-pay | Admitting: *Deleted

## 2016-01-25 DIAGNOSIS — I4892 Unspecified atrial flutter: Secondary | ICD-10-CM | POA: Diagnosis not present

## 2016-01-25 LAB — POCT INR: INR: 2.5

## 2016-01-25 NOTE — Patient Outreach (Signed)
[facility]) Care Management  01/25/2016  Brian Burns Jun 20, 1945 [id number]    RN Health Coach telephone call to patient.  [employer] compliance verified. Per patient he is a  borderline diabetes. Patient stated he does not need any help. RN explained services [facility] had to offer.   Plan: RN will send patient a brochure about the [facility] Patient was instructed that we are here to help him and to call us.  RN will send out an unsuccessful outreach letter   Gean Maidens BSN RN [facility] Care Management [phone]

## 2016-02-05 ENCOUNTER — Other Ambulatory Visit: Payer: Self-pay | Admitting: Pharmacist

## 2016-02-05 NOTE — Patient Outreach (Signed)
[facility]) Care Management  [facility]   02/05/2016  Brian Burns [birth year] [id number]  Subjective:  [facility] pharmacy referred to patient for medication management due to polypharmacy by [facility] RN Telephonic Rosezena Sensor.   Per chart review from 01/25/16 note with Scarlette Calico [facility] RN Health Coach, patient not interested in [facility] services.   Medications were with fill history and medication list in this chart.    Objective:   Current Medications: Current Outpatient Prescriptions  Medication Sig Dispense Refill  . amLODipine (NORVASC) 10 MG tablet take 1 tablet by mouth once daily 90 tablet 0  . aspirin EC 81 MG tablet Take 81 mg by mouth daily.    Marland Kitchen atorvastatin (LIPITOR) 40 MG tablet Take 1 tablet (40 mg total) by mouth daily. 90 tablet 3  . baclofen (LIORESAL) 10 MG tablet Take 1 tablet (10 mg total) by mouth 3 (three) times daily as needed for muscle spasms (For back pain). 30 each 0  . citalopram (CELEXA) 20 MG tablet take 1 tablet by mouth once daily AT 6PM 30 tablet 3  . CREON 12000 units CPEP capsule take 1 capsule by mouth three times a day WITH MEALS 270 capsule 3  . ferrous sulfate 325 (65 FE) MG tablet Take 1 tablet (325 mg total) by mouth 3 (three) times daily with meals. 90 tablet 3  . finasteride (PROSCAR) 5 MG tablet take 1 tablet by mouth once daily 30 tablet 1  . gabapentin (NEURONTIN) 100 MG capsule take 1 capsule by mouth three times a day 180 capsule 2  . lisinopril (PRINIVIL,ZESTRIL) 5 MG tablet Take 1 tablet (5 mg total) by mouth daily. 30 tablet 1  . meclizine (ANTIVERT) 25 MG tablet take 1 tablet by mouth three times a day if needed dizziness 30 tablet 1  . ondansetron (ZOFRAN) 4 MG tablet Take 1 tablet (4 mg total) by mouth every 6 (six) hours as needed for nausea or vomiting. 12 tablet 0  . pantoprazole (PROTONIX) 40 MG tablet take 1 tablet by mouth twice a day 60 tablet 2  . phenazopyridine (PYRIDIUM) 200 MG tablet Take 1 tablet (200 mg  total) by mouth 3 (three) times daily as needed for pain. 10 tablet 0  . sucralfate (CARAFATE) 1 G tablet Take 1 tablet (1 g total) by mouth 3 (three) times daily as needed. (Patient taking differently: Take 1 g by mouth 3 (three) times daily as needed (Nausea/vomitting). ) 30 tablet 0  . tamsulosin (FLOMAX) 0.4 MG CAPS capsule take 1 capsule by mouth once daily 30 capsule 3  . traMADol (ULTRAM) 50 MG tablet Take 1 tablet (50 mg total) by mouth every 8 (eight) hours as needed. 30 tablet 1  . warfarin (COUMADIN) 10 MG tablet take 1 tablet by mouth once daily 30 tablet 2   No current facility-administered medications for this visit.     Functional Status: In your present state of health, do you have any difficulty performing the following activities: 07/20/2015 07/20/2015  Hearing? - N  Vision? - N  Difficulty concentrating or making decisions? - Y  Walking or climbing stairs? Y N  Dressing or bathing? - N  Doing errands, shopping? N -  Quarry manager and eating ? - N  Using the Toilet? - N  In the past six months, have you accidently leaked urine? - Y  Do you have problems with loss of bowel control? - N  Managing your Medications? - Y  Managing your Finances? -  Y  Housekeeping or managing your Housekeeping? - N  Some recent data might be hidden    Fall/Depression Screening: PHQ 2/9 Scores 01/04/2016 11/14/2015 07/20/2015 07/04/2015 04/26/2015 11/11/2014 10/14/2014  PHQ - 2 Score 0 0 0 0 0 0 0    Assessment: Medication review performed via chart review as pt declined [facility] services.    Drugs sorted by system: Neurologic/Psychologic: Citalopram, meclizine  Cardiovascular:Amlodipine, ASA 81, atorvastatin, lisinopril, warfarin  Gastrointestinal: Creon, pantoprazole  Pain: Gabapentin  Miscellaneous:Finasteride, tamsulosin  Vitamins/Supplements: Ferrous sulfate  Medications to avoid in the elderly: Meclizine has anticholinergic properties.  Pt taking PRN.  Increased risk of side  effects like confusion, dry mouth, constipation with advanced age.    Other issues noted:   Noted allergy listed to atorvastatin (no reaction) however patient tolerating atorvastatin as evidenced by active prescription at pharmacy.  Removed atorvastatin from allergy list.     Noted several PRN medications not filled recently (baclofen, zofran, pyridium).  Recommend updating these medications as clinically appropriate with next medication review with patient   Plan: Pharmacy will sign-off case at this time as pt declines [facility] services.  Will be happy to assist in the future if other medication needs arise.  Will route [facility] pharmacy note to PCP.    Tommye Standard, PharmD, [facility] Clinical Pharmacist [facility] [phone]

## 2016-02-09 ENCOUNTER — Ambulatory Visit: Payer: Commercial Managed Care - HMO | Admitting: Podiatry

## 2016-02-11 ENCOUNTER — Other Ambulatory Visit: Payer: Self-pay | Admitting: Family Medicine

## 2016-02-16 ENCOUNTER — Ambulatory Visit: Payer: Commercial Managed Care - HMO | Admitting: Podiatry

## 2016-02-22 ENCOUNTER — Ambulatory Visit: Payer: Medicaid Other

## 2016-02-22 ENCOUNTER — Other Ambulatory Visit: Payer: Self-pay | Admitting: Family Medicine

## 2016-02-22 ENCOUNTER — Ambulatory Visit: Payer: Commercial Managed Care - HMO

## 2016-02-22 ENCOUNTER — Ambulatory Visit (INDEPENDENT_AMBULATORY_CARE_PROVIDER_SITE_OTHER): Payer: Medicaid Other | Admitting: *Deleted

## 2016-02-22 DIAGNOSIS — D62 Acute posthemorrhagic anemia: Secondary | ICD-10-CM

## 2016-02-22 DIAGNOSIS — I480 Paroxysmal atrial fibrillation: Secondary | ICD-10-CM

## 2016-02-22 LAB — POCT INR: INR: 1.8

## 2016-02-24 ENCOUNTER — Other Ambulatory Visit: Payer: Self-pay | Admitting: Family Medicine

## 2016-03-04 ENCOUNTER — Other Ambulatory Visit (INDEPENDENT_AMBULATORY_CARE_PROVIDER_SITE_OTHER): Payer: Medicare HMO | Admitting: *Deleted

## 2016-03-04 ENCOUNTER — Ambulatory Visit: Payer: Medicaid Other

## 2016-03-04 DIAGNOSIS — I480 Paroxysmal atrial fibrillation: Secondary | ICD-10-CM

## 2016-03-04 LAB — POCT INR: INR: 2.5

## 2016-03-06 ENCOUNTER — Other Ambulatory Visit: Payer: Self-pay | Admitting: Family Medicine

## 2016-03-15 ENCOUNTER — Ambulatory Visit: Payer: Medicare HMO | Admitting: Podiatry

## 2016-03-20 ENCOUNTER — Other Ambulatory Visit: Payer: Self-pay | Admitting: Family Medicine

## 2016-03-21 ENCOUNTER — Telehealth: Payer: Self-pay | Admitting: Family Medicine

## 2016-03-21 NOTE — Telephone Encounter (Signed)
Patient has [employer], and [employer] no longer requires referrals.

## 2016-03-21 NOTE — Telephone Encounter (Signed)
Pt states he needs a referral for his appointment tomorrow with Dr. Stacie Acres @ 11am. ep

## 2016-03-22 ENCOUNTER — Ambulatory Visit (INDEPENDENT_AMBULATORY_CARE_PROVIDER_SITE_OTHER): Payer: Medicare HMO | Admitting: Podiatry

## 2016-03-22 DIAGNOSIS — E119 Type 2 diabetes mellitus without complications: Secondary | ICD-10-CM | POA: Diagnosis not present

## 2016-03-22 DIAGNOSIS — M79676 Pain in unspecified toe(s): Secondary | ICD-10-CM

## 2016-03-22 DIAGNOSIS — B351 Tinea unguium: Secondary | ICD-10-CM

## 2016-03-22 NOTE — Progress Notes (Signed)
Patient ID: Brian Burns, male   DOB: 01/09/1946, 71 y.o.   MRN: [id number] Complaint:  Visit Type: Patient returns to my office for continued preventative foot care services. Complaint: Patient states" my nails have grown long and thick and become painful to walk and wear shoes" Patient has been diagnosed with borderline diabetic with PVD and peripheral neuropathy.. He presents for preventative foot care services. No changes to ROS  Podiatric Exam: Vascular: dorsalis pedis and posterior tibial pulses are palpable bilateral. Capillary return is immediate. Temperature gradient is WNL. Skin turgor WNL  Sensorium: Normal Semmes Weinstein monofilament test. Normal tactile sensation bilaterally. Nail Exam: Pt has thick disfigured discolored nails with subungual debris noted bilateral entire nail hallux through fifth toenails Ulcer Exam: There is no evidence of ulcer or pre-ulcerative changes or infection. Orthopedic Exam: Muscle tone and strength are WNL. No limitations in general ROM. No crepitus or effusions noted. Foot type and digits show no abnormalities. Bony prominences are unremarkable. Skin: No Porokeratosis. No infection or ulcers  Diagnosis:  Tinea unguium, Pain in right toe, pain in left toes  Treatment & Plan Procedures and Treatment: Consent by patient was obtained for treatment procedures. The patient understood the discussion of treatment and procedures well. All questions were answered thoroughly reviewed. Debridement of mycotic and hypertrophic toenails, 1 through 5 bilateral and clearing of subungual debris. No ulceration, no infection noted.  Return Visit-Office Procedure: Patient instructed to return to the office for a follow up visit 3 months for continued evaluation and treatment.  Davarious Tumbleson DPM 

## 2016-04-01 ENCOUNTER — Ambulatory Visit (INDEPENDENT_AMBULATORY_CARE_PROVIDER_SITE_OTHER): Payer: Medicare HMO | Admitting: *Deleted

## 2016-04-01 DIAGNOSIS — I480 Paroxysmal atrial fibrillation: Secondary | ICD-10-CM

## 2016-04-01 LAB — POCT INR: INR: 1.7

## 2016-04-06 ENCOUNTER — Other Ambulatory Visit: Payer: Self-pay | Admitting: Family Medicine

## 2016-04-18 DIAGNOSIS — H26491 Other secondary cataract, right eye: Secondary | ICD-10-CM | POA: Diagnosis not present

## 2016-04-18 DIAGNOSIS — Z961 Presence of intraocular lens: Secondary | ICD-10-CM | POA: Diagnosis not present

## 2016-04-18 DIAGNOSIS — H35033 Hypertensive retinopathy, bilateral: Secondary | ICD-10-CM | POA: Diagnosis not present

## 2016-04-18 DIAGNOSIS — H40013 Open angle with borderline findings, low risk, bilateral: Secondary | ICD-10-CM | POA: Diagnosis not present

## 2016-04-23 ENCOUNTER — Ambulatory Visit (INDEPENDENT_AMBULATORY_CARE_PROVIDER_SITE_OTHER): Payer: Medicare HMO | Admitting: Family Medicine

## 2016-04-23 ENCOUNTER — Encounter: Payer: Self-pay | Admitting: Family Medicine

## 2016-04-23 ENCOUNTER — Ambulatory Visit (INDEPENDENT_AMBULATORY_CARE_PROVIDER_SITE_OTHER): Payer: Medicare HMO | Admitting: *Deleted

## 2016-04-23 VITALS — BP 110/80 | HR 77 | Temp 97.6°F | Ht 76.0 in | Wt 234.0 lb

## 2016-04-23 DIAGNOSIS — G8929 Other chronic pain: Secondary | ICD-10-CM | POA: Diagnosis not present

## 2016-04-23 DIAGNOSIS — M545 Low back pain: Secondary | ICD-10-CM | POA: Diagnosis not present

## 2016-04-23 DIAGNOSIS — I480 Paroxysmal atrial fibrillation: Secondary | ICD-10-CM | POA: Diagnosis not present

## 2016-04-23 DIAGNOSIS — E119 Type 2 diabetes mellitus without complications: Secondary | ICD-10-CM

## 2016-04-23 DIAGNOSIS — I10 Essential (primary) hypertension: Secondary | ICD-10-CM | POA: Diagnosis not present

## 2016-04-23 DIAGNOSIS — D62 Acute posthemorrhagic anemia: Secondary | ICD-10-CM | POA: Diagnosis not present

## 2016-04-23 LAB — POCT GLYCOSYLATED HEMOGLOBIN (HGB A1C): Hemoglobin A1C: 6.4

## 2016-04-23 LAB — POCT INR: INR: 2

## 2016-04-23 MED ORDER — PANTOPRAZOLE SODIUM 40 MG PO TBEC: 40.0000 mg | DELAYED_RELEASE_TABLET | Freq: Every day | ORAL | 0 refills | Status: DC

## 2016-04-23 NOTE — Assessment & Plan Note (Signed)
History of a GI bleed No further bleeding We will recheck iron panel and CBC today and consider stopping iron supplementation

## 2016-04-23 NOTE — Assessment & Plan Note (Signed)
Chronic and stable No red flags Likely related to degenerative disc disease Continue Tylenol, tramadol No longer taking baclofen Follow-up as needed

## 2016-04-23 NOTE — Assessment & Plan Note (Signed)
Orthostatic hypotension noted today with standing blood pressure of 90/60 Continue lisinopril 5 mg daily as patient is diabetic  Decrease amlodipine dose to 5 mg daily Follow-up in one month Advised patient to be careful and stand up slowly

## 2016-04-23 NOTE — Progress Notes (Signed)
Subjective:   Brian Burns is a 71 y.o. male with a history of CVA, PAF, PVD, CAD, T2DM, HTN here for chronic disease management  T2DM - Checking BG at home: fasting 100-160 - Medications: none - diet controlled - Compliance: n/a - Diet: eats a lot of eggs and cheese, balanced, low carb - Exercise: walk 4x/week ~1.5-2 miles - eye exam: needs - foot exam: sees podiatry - microalbumin: n/a ACEi - denies symptoms of hypoglycemia, polyuria, polydipsia, numbness extremities, foot ulcers/trauma  HTN: - Medications: amlodipine 10mg  daily, lisinopril 5mg  daily - Compliance: good - Checking BP at home: no - Denies any SOB, CP, vision changes, LE edema, medication SEs - does get dizzy when standing quickly - has tried meclizine for this  Back pain - intermittent b/l LBP without radiation - helped with movement and tylenol arthritis - does take occasional Tramadol when it is severe. - no weakness, numbness, incontinence, fevers, night sweats, h/o cancer  Reviewed other medications, indications, and updated med list  Review of Systems:  Per HPI.   Social History: former smoker  Objective:  BP 110/80   Pulse 77   Temp 97.6 F (36.4 C) (Oral)   Ht 6\' 4"  (1.93 m)   Wt 234 lb (106.1 kg)   BMI 28.48 kg/m   Gen:  71 y.o. male in NAD HEENT: NCAT, MMM, EOMI, PERRL, anicteric sclerae CV: RRR, no MRG Resp: Non-labored, CTAB, no wheezes noted Abd: Soft, NTND, BS present, no guarding or organomegaly Ext: WWP, no edema MSK: Back: No midline tenderness to palpation, mild tenderness over bilateral lumbar paraspinal muscles, negative straight leg raise bilaterally, strength intact in lower extremities, gait slow and walks with cane Neuro: Alert and oriented, speech normal       Chemistry      Component Value Date/Time   NA 138 04/26/2015 1538   K 4.0 04/26/2015 1538   CL 107 04/26/2015 1538   CO2 24 04/26/2015 1538   BUN 20 04/26/2015 1538   CREATININE 1.33 (H) 04/26/2015 1538       Component Value Date/Time   CALCIUM 8.4 (L) 04/26/2015 1538   ALKPHOS 88 04/26/2015 1538   AST 17 04/26/2015 1538   ALT 16 04/26/2015 1538   BILITOT 0.4 04/26/2015 1538      Lab Results  Component Value Date   WBC 4.4 04/26/2015   HGB 8.7 (L) 04/26/2015   HCT 27.0 (L) 04/26/2015   MCV 90.6 04/26/2015   PLT 162 04/26/2015   Lab Results  Component Value Date   TSH 1.148 03/16/2015   Lab Results  Component Value Date   HGBA1C 6.4 04/23/2016   Assessment & Plan:     Brian Burns is a 71 y.o. male here for   HYPERTENSION, BENIGN SYSTEMIC Orthostatic hypotension noted today with standing blood pressure of 90/60 Continue lisinopril 5 mg daily as patient is diabetic  Decrease amlodipine dose to 5 mg daily Follow-up in one month Advised patient to be careful and stand up slowly  Type 2 diabetes mellitus without complication, without long-term current use of insulin (HCC) Previously well-controlled and diet controlled Recheck A1c today Follow-up in 3-6 months  BACK PAIN, LUMBAR, CHRONIC Chronic and stable No red flags Likely related to degenerative disc disease Continue Tylenol, tramadol No longer taking baclofen Follow-up as needed  Acute blood loss anemia History of a GI bleed No further bleeding We will recheck iron panel and CBC today and consider stopping iron supplementation   Marylene Land  Berton Bon, MD MPH PGY-3,  [facility] 04/23/2016  4:30 PM

## 2016-04-23 NOTE — Patient Instructions (Addendum)
Nice to see you again today. We are getting some labs today and someone will call you or send you a letter with the results when they're available.  Your blood pressure is low when you stand up. Please decrease your amlodipine to 5 mg daily. You can do this by cutting the pills in half. We will follow-up in one month to see how your blood pressure is doing on this decreased dose. This may be what your dizziness is related to.  Take care,  Dr. Leonard Schwartz

## 2016-04-23 NOTE — Assessment & Plan Note (Signed)
Previously well-controlled and diet controlled Recheck A1c today Follow-up in 3-6 months

## 2016-05-01 ENCOUNTER — Other Ambulatory Visit: Payer: Self-pay | Admitting: Family Medicine

## 2016-05-06 ENCOUNTER — Ambulatory Visit (INDEPENDENT_AMBULATORY_CARE_PROVIDER_SITE_OTHER): Payer: Medicare HMO | Admitting: *Deleted

## 2016-05-06 DIAGNOSIS — I480 Paroxysmal atrial fibrillation: Secondary | ICD-10-CM | POA: Diagnosis not present

## 2016-05-06 LAB — POCT INR: INR: 1.4

## 2016-05-08 DIAGNOSIS — Z8546 Personal history of malignant neoplasm of prostate: Secondary | ICD-10-CM | POA: Diagnosis not present

## 2016-05-13 DIAGNOSIS — N359 Urethral stricture, unspecified: Secondary | ICD-10-CM | POA: Diagnosis not present

## 2016-05-20 ENCOUNTER — Ambulatory Visit (INDEPENDENT_AMBULATORY_CARE_PROVIDER_SITE_OTHER): Payer: Medicare HMO | Admitting: *Deleted

## 2016-05-20 DIAGNOSIS — I480 Paroxysmal atrial fibrillation: Secondary | ICD-10-CM | POA: Diagnosis not present

## 2016-05-20 LAB — POCT INR: INR: 2.4

## 2016-06-03 ENCOUNTER — Ambulatory Visit (INDEPENDENT_AMBULATORY_CARE_PROVIDER_SITE_OTHER): Payer: Medicare HMO | Admitting: *Deleted

## 2016-06-03 DIAGNOSIS — I480 Paroxysmal atrial fibrillation: Secondary | ICD-10-CM | POA: Diagnosis not present

## 2016-06-03 LAB — POCT INR: INR: 2.3

## 2016-06-18 ENCOUNTER — Other Ambulatory Visit: Payer: Self-pay | Admitting: Urology

## 2016-06-18 DIAGNOSIS — N359 Urethral stricture, unspecified: Secondary | ICD-10-CM

## 2016-06-19 ENCOUNTER — Other Ambulatory Visit: Payer: Self-pay | Admitting: Family Medicine

## 2016-06-22 ENCOUNTER — Other Ambulatory Visit: Payer: Self-pay | Admitting: Family Medicine

## 2016-06-26 ENCOUNTER — Other Ambulatory Visit: Payer: Self-pay | Admitting: Family Medicine

## 2016-06-26 ENCOUNTER — Encounter: Payer: Self-pay | Admitting: Podiatry

## 2016-06-26 ENCOUNTER — Ambulatory Visit (INDEPENDENT_AMBULATORY_CARE_PROVIDER_SITE_OTHER): Payer: Medicare HMO | Admitting: Podiatry

## 2016-06-26 DIAGNOSIS — B351 Tinea unguium: Secondary | ICD-10-CM | POA: Diagnosis not present

## 2016-06-26 DIAGNOSIS — M79676 Pain in unspecified toe(s): Secondary | ICD-10-CM | POA: Diagnosis not present

## 2016-06-26 DIAGNOSIS — E1159 Type 2 diabetes mellitus with other circulatory complications: Secondary | ICD-10-CM

## 2016-06-26 NOTE — Progress Notes (Signed)
Patient ID: Brian Burns, male   DOB: 12/12/1945, 71 y.o.   MRN: [id number] Complaint:  Visit Type: Patient returns to my office for continued preventative foot care services. Complaint: Patient states" my nails have grown long and thick and become painful to walk and wear shoes" Patient has been diagnosed with borderline diabetic with PVD and peripheral neuropathy.. He presents for preventative foot care services. No changes to ROS  Podiatric Exam: Vascular: dorsalis pedis and posterior tibial pulses are palpable bilateral. Capillary return is immediate. Temperature gradient is WNL. Skin turgor WNL  Sensorium: Normal Semmes Weinstein monofilament test. Normal tactile sensation bilaterally. Nail Exam: Pt has thick disfigured discolored nails with subungual debris noted bilateral entire nail hallux through fifth toenails Ulcer Exam: There is no evidence of ulcer or pre-ulcerative changes or infection. Orthopedic Exam: Muscle tone and strength are WNL. No limitations in general ROM. No crepitus or effusions noted. Foot type and digits show no abnormalities. Bony prominences are unremarkable. Skin: No Porokeratosis. No infection or ulcers  Diagnosis:  Tinea unguium, Pain in right toe, pain in left toes  Treatment & Plan Procedures and Treatment: Consent by patient was obtained for treatment procedures. The patient understood the discussion of treatment and procedures well. All questions were answered thoroughly reviewed. Debridement of mycotic and hypertrophic toenails, 1 through 5 bilateral and clearing of subungual debris. No ulceration, no infection noted.  Return Visit-Office Procedure: Patient instructed to return to the office for a follow up visit 3 months for continued evaluation and treatment.  Summerlyn Fickel DPM 

## 2016-06-27 ENCOUNTER — Other Ambulatory Visit: Payer: Self-pay | Admitting: Pharmacist

## 2016-06-27 DIAGNOSIS — I480 Paroxysmal atrial fibrillation: Secondary | ICD-10-CM

## 2016-07-01 ENCOUNTER — Ambulatory Visit (INDEPENDENT_AMBULATORY_CARE_PROVIDER_SITE_OTHER): Payer: Medicare Other | Admitting: *Deleted

## 2016-07-01 DIAGNOSIS — I4892 Unspecified atrial flutter: Secondary | ICD-10-CM

## 2016-07-01 LAB — POCT INR: INR: 2.2

## 2016-07-08 ENCOUNTER — Encounter: Payer: Self-pay | Admitting: Family

## 2016-07-09 ENCOUNTER — Ambulatory Visit (HOSPITAL_COMMUNITY)
Admission: RE | Admit: 2016-07-09 | Discharge: 2016-07-09 | Disposition: A | Discharge status: Home / Self Care | Payer: Medicare Other | Source: Ambulatory Visit | Attending: Urology | Admitting: Urology

## 2016-07-09 DIAGNOSIS — N359 Urethral stricture, unspecified: Secondary | ICD-10-CM | POA: Insufficient documentation

## 2016-07-09 MED ORDER — IOPAMIDOL (ISOVUE-300) INJECTION 61%
INTRAVENOUS | Status: AC
  Filled 2016-07-09: qty 50

## 2016-07-09 MED ORDER — IOPAMIDOL (ISOVUE-300) INJECTION 61%
100.0000 mL | Freq: Once | INTRAVENOUS | Status: AC | PRN
  Administered 2016-07-09: 80 mL via URETHRAL

## 2016-07-09 MED ORDER — LIDOCAINE HCL 2 % EX GEL
CUTANEOUS | Status: AC
  Filled 2016-07-09: qty 30

## 2016-07-11 ENCOUNTER — Telehealth: Payer: Self-pay | Admitting: Family Medicine

## 2016-07-11 DIAGNOSIS — N359 Urethral stricture, unspecified: Secondary | ICD-10-CM | POA: Diagnosis not present

## 2016-07-11 DIAGNOSIS — R3912 Poor urinary stream: Secondary | ICD-10-CM | POA: Diagnosis not present

## 2016-07-11 DIAGNOSIS — N35013 Post-traumatic anterior urethral stricture: Secondary | ICD-10-CM | POA: Diagnosis not present

## 2016-07-11 NOTE — Telephone Encounter (Signed)
Dr. Marlou Porch with [facility] planning for surgery for urethral stricture in patient in late July.  Looking for advise on his coumadin.  Plan to hold for 5 days preceding surgery.  On it for PAF.  Can resume 1-2 days after surgery depending on bleeding risk per surgeon.  Needs f/u after surgery for tracking INR.  Erasmo Downer, MD, MPH PGY-3,  [facility] 07/11/2016 4:29 PM

## 2016-07-12 ENCOUNTER — Other Ambulatory Visit: Payer: Self-pay | Admitting: Family Medicine

## 2016-07-17 ENCOUNTER — Other Ambulatory Visit: Payer: Self-pay | Admitting: Family Medicine

## 2016-07-18 ENCOUNTER — Ambulatory Visit (HOSPITAL_COMMUNITY)
Admission: RE | Admit: 2016-07-18 | Discharge: 2016-07-18 | Disposition: A | Discharge status: Home / Self Care | Payer: Medicare Other | Source: Ambulatory Visit | Attending: Family | Admitting: Family

## 2016-07-18 ENCOUNTER — Encounter: Payer: Self-pay | Admitting: Family

## 2016-07-18 ENCOUNTER — Ambulatory Visit (INDEPENDENT_AMBULATORY_CARE_PROVIDER_SITE_OTHER)
Admission: RE | Admit: 2016-07-18 | Discharge: 2016-07-18 | Disposition: A | Discharge status: Home / Self Care | Payer: Medicare Other | Source: Ambulatory Visit | Attending: Vascular Surgery | Admitting: Vascular Surgery

## 2016-07-18 ENCOUNTER — Ambulatory Visit (INDEPENDENT_AMBULATORY_CARE_PROVIDER_SITE_OTHER): Payer: Medicare Other | Admitting: Family

## 2016-07-18 VITALS — BP 111/76 | HR 45 | Temp 97.0°F | Resp 20 | Ht 76.0 in | Wt 224.0 lb

## 2016-07-18 DIAGNOSIS — Z7722 Contact with and (suspected) exposure to environmental tobacco smoke (acute) (chronic): Secondary | ICD-10-CM | POA: Diagnosis not present

## 2016-07-18 DIAGNOSIS — I714 Abdominal aortic aneurysm, without rupture, unspecified: Secondary | ICD-10-CM

## 2016-07-18 DIAGNOSIS — I739 Peripheral vascular disease, unspecified: Secondary | ICD-10-CM

## 2016-07-18 DIAGNOSIS — R0989 Other specified symptoms and signs involving the circulatory and respiratory systems: Secondary | ICD-10-CM

## 2016-07-18 DIAGNOSIS — Z87891 Personal history of nicotine dependence: Secondary | ICD-10-CM | POA: Diagnosis not present

## 2016-07-18 NOTE — Patient Instructions (Addendum)
Secondhand Smoke What is secondhand smoke? Secondhand smoke is smoke that comes from burning tobacco. It could be the smoke from a cigarette, a pipe, or a cigar. Even if you are not the one smoking, secondhand smoke exposes you to the dangers of smoking. This is called involuntary, or passive, smoking. There are two types of secondhand smoke:  Sidestream smoke is the smoke that comes off the lighted end of a cigarette, pipe, or cigar. ? This type of smoke has the highest amount of cancer-causing agents (carcinogens). ? The particles in sidestream smoke are smaller. They get into your lungs more easily.  Mainstream smoke is the smoke that is exhaled by a person who is smoking. ? This type of smoke is also dangerous to your health.  How can secondhand smoke affect my health? Studies show that there is no safe level of secondhand smoke. This smoke contains thousands of chemicals. At least 69 of them are known to cause cancer. Secondhand smoke can also cause many other health problems. It has been linked to:  Lung cancer.  Cancer of the voice box (larynx) or throat.  Cancer of the sinuses.  Brain cancer.  Bladder cancer.  Stomach cancer.  Breast cancer.  White blood cell cancers (lymphoma and leukemia).  Brain and liver tumors in children.  Heart disease and stroke in adults.  Pregnancy loss (miscarriage).  Diseases in children, such as: ? Asthma. ? Lung infections. ? Ear infections. ? Sudden infant death syndrome (SIDS). ? Slow growth.  Where can I be at risk for exposure to secondhand smoke?  For adults, the workplace is the main source of exposure to secondhand smoke. ? Your workplace should have a policy separating smoking areas from nonsmoking areas. ? Smoking areas should have a system for ventilating and cleaning the air.  For children, the home may be the most dangerous place for exposure to secondhand smoke. ? Children who live in apartment buildings may be at  risk from smoke drifting from hallways or other people's homes.  For everyone, many public places are possible sources of exposure to secondhand smoke. ? These places include restaurants, shopping centers, and parks. How can I reduce my risk for exposure to secondhand smoke? The most important thing you can do is not smoke. Discourage family members from smoking. Other ways to reduce exposure for you and your family include the following:  Keep your home smoke free.  Make sure your child care providsmers do not smoke.  Warn your child about the dangers of smoking and secondhand smoke.  Do not allow smoking in your car. When someone smokes in a car, all the damaging chemicals from the oke are confined in a small area.  Avoid public places where smoking is allowed.  This information is not intended to replace advice given to you by your health care provider. Make sure you discuss any questions you have with your health care provider. Document Released: 02/22/2004 Document Revised: 12/12/2015 Document Reviewed: 04/30/2013 Elsevier Interactive Patient Education  2017 [employer] Inc.        Abdominal Aortic Aneurysm Blood pumps away from the heart through tubes (blood vessels) called arteries. Aneurysms are weak or damaged places in the wall of an artery. It bulges out like a balloon. An abdominal aortic aneurysm happens in the main artery of the body (aorta). It can burst or tear, causing bleeding inside the body. This is an emergency. It needs treatment right away. What are the causes? The exact cause is unknown.  Things that could cause this problem include:  Fat and other substances building up in the lining of a tube.  Swelling of the walls of a blood vessel.  Certain tissue diseases.  Belly (abdominal) trauma.  An infection in the main artery of the body.  What increases the risk? There are things that make it more likely for you to have an aneurysm. These include:  Being  over the age of 69 years old.  Having high blood pressure (hypertension).  Being a male.  Being white.  Being very overweight (obese).  Having a family history of aneurysm.  Using tobacco products.  What are the signs or symptoms? Symptoms depend on the size of the aneurysm and how fast it grows. There may not be symptoms. If symptoms occur, they can include:  Pain (belly, side, lower back, or groin).  Feeling full after eating a small amount of food.  Feeling sick to your stomach (nauseous), throwing up (vomiting), or both.  Feeling a lump in your belly that feels like it is beating (pulsating).  Feeling like you will pass out (faint).  How is this treated?  Medicine to control blood pressure and pain.  Imaging tests to see if the aneurysm gets bigger.  Surgery. How is this prevented? To lessen your chance of getting this condition:  Stop smoking. Stop chewing tobacco.  Limit or avoid alcohol.  Keep your blood pressure, blood sugar, and cholesterol within normal limits.  Eat less salt.  Eat foods low in saturated fats and cholesterol. These are found in animal and whole dairy products.  Eat more fiber. Fiber is found in whole grains, vegetables, and fruits.  Keep a healthy weight.  Stay active and exercise often.  This information is not intended to replace advice given to you by your health care provider. Make sure you discuss any questions you have with your health care provider. Document Released: 05/11/2012 Document Revised: 06/22/2015 Document Reviewed: 02/14/2012 Elsevier Interactive Patient Education  2017 [employer].

## 2016-07-18 NOTE — Progress Notes (Signed)
[facility] [place]   CC: Follow up Abdominal Aortic Aneurysm  History of Present Illness  Brian Burns is a 71 y.o. (1945-10-12) male patient of Dr. Darrick Penna whom we are following for mild peripheral arterial disease as well as abdominal aortic aneurysm. He was referred to Dr. Shon Baton about 2015 for back pain. He states his back pain did improve a little bit with an injection. He is following up with Dr. Shon Baton on as-needed basis. He denies any abdominal pain. He states that he also has some lower back pain that eases a little with laying down, otherwise, nothing alleviates the pain. He denies claudication sx's in his legs with walking.  He does not smoke and has a remote tobacco history. He does have hx of CVA x 2 in 2010 and 2013 with no residual. His symptoms are very similar to his previous office visit in June of 2013. He denies any subsequent strokes or TIA.    He does not describe rest pain. He has no wounds on his feet. Chronic medical problems include hypertension, hyperlipidemia, chronic back pain, coronary disease of which are currently stable.   His walking seems limited by arthritis pain in both knees, uses a cane. He denies issues with falling.  Dr. Darrick Penna last saw pt on. 07/07/14. At that time pt had complaints of bilateral thigh pain and lower back pain. His ABI's were essentially unchanged from his ABI's 3 years prior. The pain he described is not consistent with claudication. Small abdominal aortic aneurysm unchanged over the previous year.  He was hospitalized at [facility] in February 2017 for GI bleed, then SNF. He denies chest pain, denies dyspnea.   Pt Diabetic: No, 6.4 A1C in March 2018 (review of records) Pt smoker: former smoker, quit about 2002, but his wife and daughter occasionally smoke in his home  Pt meds include: Statin :Yes Betablocker: No ASA: Yes Other anticoagulants/antiplatelets: warfarin, hx of atrial fib and atrial  flutter    Past Medical History:  Diagnosis Date  . AAA (abdominal aortic aneurysm) without rupture (HCC) 06/2014   no change in last exam from 2015  . Arthritis    knees,lower back  . Atrial fibrillation (HCC)   . Atrial flutter with rapid ventricular response (HCC) 01/16/2015  . CAD (coronary artery disease)   . Carotid artery disease (HCC)   . Chronic back pain   . Erectile dysfunction   . GERD (gastroesophageal reflux disease)   . History of CVA (cerebrovascular accident)    2009  &  2013  . HLD (hyperlipidemia)   . Hypertension   . Idiopathic pancreatitis 12/2014   acute  . PVD (peripheral vascular disease) with claudication (HCC)   . Type 2 diabetes mellitus (HCC)    no meds per pt   Past Surgical History:  Procedure Laterality Date  . CARDIOVASCULAR STRESS TEST  08-09-2009   mild global hypokinesis,  no ischemia or scar/  ef 41%  . CATARACT EXTRACTION W/ INTRAOCULAR LENS  IMPLANT, BILATERAL  april and may 2014  . CYSTOSCOPY WITH RETROGRADE URETHROGRAM N/A 11/24/2014   Procedure: CYSTOSCOPY WITH RETROGRADE URETHROGRAM;  Surgeon: Crist Fat, MD;  Location: [facility];  Service: Urology;  Laterality: N/A;  . CYSTOSCOPY WITH URETHRAL DILATATION N/A 11/24/2014   Procedure: CYSTOSCOPY WITH URETHRAL DILATATION;  Surgeon: Crist Fat, MD;  Location: [facility];  Service: Urology;  Laterality: N/A;  BALLOON DILATION        .  ESOPHAGOGASTRODUODENOSCOPY N/A 05/15/2014   Procedure: ESOPHAGOGASTRODUODENOSCOPY (EGD);  Surgeon: Carman Ching, MD;  Location[facility] ENDOSCOPY;  Service: Endoscopy;  Laterality: N/A;  . ESOPHAGOGASTRODUODENOSCOPY Left 02/02/2015   Procedure: ESOPHAGOGASTRODUODENOSCOPY (EGD);  Surgeon: Dorena Cookey, MD;  Location: [facility] ENDOSCOPY;  Service: Endoscopy;  Laterality: Left;  . ESOPHAGOGASTRODUODENOSCOPY Left 03/16/2015   Procedure: ESOPHAGOGASTRODUODENOSCOPY (EGD);  Surgeon: Dorena Cookey, MD;  Location: [facility] ENDOSCOPY;   Service: Endoscopy;  Laterality: Left;  . TRANSTHORACIC ECHOCARDIOGRAM  01-07-2012   mild to moderate dilated LV,  ef 45-50%,  mild basal hypokinesis,  grade I diastolic  dysfunction/  trivial AR/  mild MR   Social History Social History   Social History  . Marital status: Married    Spouse name: N/A  . Number of children: 2   . Years of education: N/A   Occupational History  .  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 [employer]     on the weekends   Social History Main Topics  . Smoking status: Former Smoker    Packs/day: 0.50    Years: 40.00    Types: Cigarettes    Quit date: 01/29/2004  . Smokeless tobacco: Never Used  . Alcohol use No  . Drug use: No  . Sexual activity: Not Currently   Other Topics Concern  . Not on file   Social History Narrative  . No narrative on file   Family History Family History  Problem Relation Age of Onset  . Lung disease Brother   . Kidney disease Brother   . Heart disease Brother        died at 31  . Heart disease Mother        died at 82  . Kidney disease Mother   . Lung disease Mother     Current Outpatient Prescriptions on File Prior to Visit  Medication Sig Dispense Refill  . amLODipine (NORVASC) 10 MG tablet take 1 tablet by mouth once daily 90 tablet 0  . atorvastatin (LIPITOR) 40 MG tablet take 1 tablet by mouth once daily 90 tablet 3  . citalopram (CELEXA) 20 MG tablet take 1 tablet by mouth once daily AT 6PM 30 tablet 3  . ferrous sulfate 325 (65 FE) MG tablet take 1 tablet by mouth three times a day with meals 90 tablet 3  . finasteride (PROSCAR) 5 MG tablet take 1 tablet by mouth once daily 30 tablet 1  . gabapentin (NEURONTIN) 100 MG capsule take 1 capsule by mouth three times a day 180 capsule 2  . lisinopril (PRINIVIL,ZESTRIL) 5 MG tablet take 1 tablet by mouth once daily 30 tablet 1  . pantoprazole (PROTONIX) 40 MG tablet Take 1 tablet (40 mg total) by mouth daily. 30 tablet 0  . tamsulosin (FLOMAX) 0.4  MG CAPS capsule take 1 capsule by mouth once daily 30 capsule 3  . traMADol (ULTRAM) 50 MG tablet Take 1 tablet (50 mg total) by mouth every 8 (eight) hours as needed. 30 tablet 1  . warfarin (COUMADIN) 10 MG tablet take 1 tablet by mouth once daily 30 tablet 2   No current facility-administered medications on file prior to visit.    Allergies  Allergen Reactions  . Novocain [Procaine Hcl] Nausea And Vomiting  . Calcium Carbonate     On MAR  . Doxycycline     On MAR    ROS: See HPI for pertinent positives and negatives.  Physical Examination  Vitals:   07/18/16 0856 07/18/16  0857  BP: 115/72 111/76  Pulse: (!) 45   Resp: 20   Temp: 97 F (36.1 C)   TempSrc: Oral   SpO2: 97%   Weight: 224 lb (101.6 kg)   Height: 6\' 4"  (1.93 m)    Body mass index is 27.27 kg/m.  General: WDWN in NAD Gait: Normal HENT: WNL Eyes: Pupils equal Pulmonary: normal non-labored breathing, no rales, rhonchi, or wheezing Cardiac: RRR, no murmur detected  Abdomen: soft, NT, no masses palpated Skin: no rashes, no ulcers, no cellulitis.  VASCULAR EXAM  Carotid Bruits Right Left   Negative Negative   Aorta is not palpable Radial pulses are 2+ palpable and =                          VASCULAR EXAM: Extremities no ischemic changes, no Gangrene; no open wounds.                                                                                                                                                       LE Pulses Right Left       FEMORAL  4+ palpable  4+ palpable        POPLITEAL  3+palpable   3+ palpable       POSTERIOR TIBIAL  2+ palpable   not palpable        DORSALIS PEDIS      ANTERIOR TIBIAL not palpable  not palpable     Musculoskeletal: no muscle wasting or atrophy; no peripheral edema          Neurologic: A&O X 3; Appropriate Affect ;  SENSATION: normal; MOTOR FUNCTION: 5/5 Symmetric, CN 2-12 intact Speech is edentulous    Non-Invasive Vascular  Imaging  AAA Duplex (07/18/2016)  Previous size: 3.07 cm (Date: 07-13-15), Right CIA: 1.86 cm; Left CIA: 2.19 cm  Current size:  3.15 cm (Date: 07/18/16), Right CIA: 1.87 cm; Left CIA: 2.34 cm  ABI (Date: 07/18/16)  R:   ABI: 1.07 (was 1.13 on 07-13-15),   PT: tri  DP: bi  TBI:  0.83 (was 0.78)  L:   ABI: 0.93 (was 0.89),   PT: bi  DP: bi  TBI: 0.83 (was 0.68) Stable bilateral ABI: normal on the right , mild disease on the left, slight improvement in bilateral TBI.  Medical Decision Making  The patient is a 71 y.o. male who has chronic back pain from spine issues, no new back pain, no abdominal pain. He has no claudication sx's with walking which is limited by back pain/spine issues and arthritis/joint pain, no signs of ischemia in his feet/legs. Prominent popliteal pulses bilaterally, see Plan.   AAA duplex today indicates no significant change in size of small AAA and slight increase in diameter of left CIA to 2.34 cm.  ABI's remain stable with normal right LE and mild arterial occlusive disease in left LE.    PLAN:   Graduated walking program discussed and how to achieve.   Based on today's exam and non-invasive vascular lab results, the patient will follow up in 1 year with the following tests: AAA duplex and bilateral popliteal artery duplex.  Consideration for repair of AAA would be made when the size is 5.5 cm, growth > 1 cm/yr, and symptomatic status.        The patient was given information about AAA including signs, symptoms, treatment,       and how to minimize the risk of enlargement and rupture of aneurysms.    I emphasized the importance of maximal medical management including strict control of blood pressure, blood glucose, and lipid levels, antiplatelet agents, obtaining regular exercise, and continued cessation of smoking.   The patient was advised to call 911 should the patient experience sudden onset abdominal or back pain.   Thank you for  allowing [place] to participate in this patient's care.  Charisse March, RN, MSN, FNP-C [facility] [place] Office: [phone]  Clinic Physician: Myra Gianotti on call  07/18/2016, 9:16 AM

## 2016-07-19 NOTE — Addendum Note (Signed)
Addended by: Dier Apley A on: 07/19/2016 10:26 AM   Modules accepted: Orders

## 2016-07-29 ENCOUNTER — Ambulatory Visit (INDEPENDENT_AMBULATORY_CARE_PROVIDER_SITE_OTHER): Payer: Medicare Other | Admitting: *Deleted

## 2016-07-29 DIAGNOSIS — I480 Paroxysmal atrial fibrillation: Secondary | ICD-10-CM

## 2016-07-29 LAB — POCT INR: INR: 3.5

## 2016-07-29 LAB — POCT HEMOGLOBIN: HEMOGLOBIN: 11.4 g/dL — AB (ref 14.1–18.1)

## 2016-08-05 ENCOUNTER — Ambulatory Visit (INDEPENDENT_AMBULATORY_CARE_PROVIDER_SITE_OTHER): Payer: Medicare Other | Admitting: *Deleted

## 2016-08-05 DIAGNOSIS — Z7901 Long term (current) use of anticoagulants: Secondary | ICD-10-CM

## 2016-08-05 LAB — POCT INR: INR: 1.6

## 2016-08-19 DIAGNOSIS — H02051 Trichiasis without entropian right upper eyelid: Secondary | ICD-10-CM | POA: Diagnosis not present

## 2016-08-19 DIAGNOSIS — H40013 Open angle with borderline findings, low risk, bilateral: Secondary | ICD-10-CM | POA: Diagnosis not present

## 2016-08-22 ENCOUNTER — Ambulatory Visit (INDEPENDENT_AMBULATORY_CARE_PROVIDER_SITE_OTHER): Payer: Medicare Other | Admitting: *Deleted

## 2016-08-22 DIAGNOSIS — Z7901 Long term (current) use of anticoagulants: Secondary | ICD-10-CM

## 2016-08-22 LAB — POCT INR: INR: 1.3

## 2016-08-26 ENCOUNTER — Encounter: Payer: Self-pay | Admitting: Family Medicine

## 2016-08-26 ENCOUNTER — Ambulatory Visit (INDEPENDENT_AMBULATORY_CARE_PROVIDER_SITE_OTHER): Payer: Medicare Other | Admitting: Family Medicine

## 2016-08-26 VITALS — BP 102/58 | HR 57 | Temp 98.6°F | Wt 228.0 lb

## 2016-08-26 DIAGNOSIS — R001 Bradycardia, unspecified: Secondary | ICD-10-CM

## 2016-08-26 DIAGNOSIS — I480 Paroxysmal atrial fibrillation: Secondary | ICD-10-CM

## 2016-08-26 DIAGNOSIS — E119 Type 2 diabetes mellitus without complications: Secondary | ICD-10-CM

## 2016-08-26 LAB — POCT GLYCOSYLATED HEMOGLOBIN (HGB A1C): Hemoglobin A1C: 6.6

## 2016-08-26 NOTE — Patient Instructions (Addendum)
It was very nice to meet you and your daughter today.  You were seen in clinic for your low heart rate.  I reviewed your medications and did not see any that could be causing this.  Your surgeon would like cardiac clearance prior to the urethral surgery and therefore I would recommend keeping your appointment with Dr. Rennis Golden on 09/05/2016 at 8:45 AM.    In the meantime since you are not undergoing surgery and have been off your Coumadin for several days, I would recommend restarting it to reduce your risk of clots.  You can discontinue it 5 days prior to surgery again once they decide on a date.   Please call clinic with any questions you may have.  Be well, Freddrick March, MD

## 2016-08-26 NOTE — Progress Notes (Signed)
   Subjective:   Patient ID: NATTHEW GEBRE    DOB: 1945/06/25, 71 y.o. male   MRN: [id number]  CC: follow up bradycardia  HPI: DAWAUN PASE is a 71 y.o. male who presents to clinic today for low heart rate.    Bradycardia -Was supposed to have an outpatient procedure at surgery center on 7/27 for urethral obstruction however was cancelled due to low HR -EKG from 7/27 with sinus rhythm and HR 43  -Per surgeon, would first need cardiac clearance.  Pt presents to clinic today for referral to Cardiologist however does have an appointment scheduled with Dr. Zoila Shutter on 09/05/2016 at 8:45 AM.  Has never seen a cardiologist before.  He is present today with his daughter who reports he has not been taking his Coumadin since 7/21 in anticipation of surgery on 7/27.   -Pt currently denies CP, SOB, palpitations, lightheadedness, and dizziness.  Is not on a beta-blocker or any medications which could be contributing to his bradycardia.  Does have a significant cardiac history of CAD, AAA, atrial fibrillation and PVD which would warrant clearance.    Paroxysmal atrial fibrillation -Pt on Coumadin but currently not taking due to impending surgical procedure  -Last dose was on 7/21   ROS: See HPI for pertinent ROS. PMFSH: Pertinent past medical, surgical, family, and social history were reviewed and updated as appropriate. Smoking status reviewed. Medications reviewed.  Objective:   BP (!) 102/58   Pulse (!) 57   Temp 98.6 F (37 C) (Oral)   Wt 228 lb (103.4 kg)   SpO2 97%   BMI 27.75 kg/m  Vitals and nursing note reviewed.  General: 71 yo male, no acute distress  HEENT: NCAT, MMM, o/p clear  CV: RRR no MRG  Lungs: CTAB, work of breathing is comfortable  Abdomen: soft, NTND, +bs  Skin: warm, dry  Extremities: warm, well perfused   Assessment & Plan:   Paroxysmal atrial fibrillation (HCC) Recommend restarting Coumadin today as surgery postponed and pt with h/o CVA 2/2 a-fib.  Most  recent INR of 1.3 from 7/26.   Instructed pt to stop it 5 days prior to procedure once a date is fixed.   Bradycardia Recent EKG on 7/27 with HR 43.  Pt is asymptomatic and denies CP, SOB, palpitations, dizziness, lightheadedness.  No red flags on exam and no murmur appreciated on exam.   -Discussed with referral coordinator and does not require referral to cardiology -Advised to keep appt with Dr. Rennis Golden on 8/9 for cardiac clearance   Orders Placed This Encounter  Procedures  . POCT HgB A1C (CPT 83036)   Follow up: 2 months  Freddrick March, MD [facility], PGY-1 08/27/2016 5:08 PM

## 2016-08-27 DIAGNOSIS — R001 Bradycardia, unspecified: Secondary | ICD-10-CM

## 2016-08-27 HISTORY — DX: Bradycardia, unspecified: R00.1

## 2016-08-27 NOTE — Assessment & Plan Note (Signed)
Recent EKG on 7/27 with HR 43.  Pt is asymptomatic and denies CP, SOB, palpitations, dizziness, lightheadedness.  No red flags on exam and no murmur appreciated on exam.   -Discussed with referral coordinator and does not require referral to cardiology -Advised to keep appt with Dr. Rennis Golden on 8/9 for cardiac clearance

## 2016-08-27 NOTE — Assessment & Plan Note (Addendum)
Recommend restarting Coumadin today as surgery postponed and pt with h/o CVA 2/2 a-fib.  Most recent INR of 1.3 from 7/26.   Instructed pt to stop it 5 days prior to procedure once a date is fixed.

## 2016-08-28 ENCOUNTER — Other Ambulatory Visit: Payer: Self-pay | Admitting: Family Medicine

## 2016-09-02 ENCOUNTER — Ambulatory Visit (INDEPENDENT_AMBULATORY_CARE_PROVIDER_SITE_OTHER): Payer: Medicare Other | Admitting: *Deleted

## 2016-09-02 DIAGNOSIS — Z7901 Long term (current) use of anticoagulants: Secondary | ICD-10-CM

## 2016-09-02 LAB — POCT INR: INR: 1.4

## 2016-09-05 ENCOUNTER — Encounter: Payer: Self-pay | Admitting: Internal Medicine

## 2016-09-05 ENCOUNTER — Ambulatory Visit (INDEPENDENT_AMBULATORY_CARE_PROVIDER_SITE_OTHER): Payer: Medicare Other | Admitting: Internal Medicine

## 2016-09-05 VITALS — BP 110/78 | HR 52 | Ht 76.0 in | Wt 233.0 lb

## 2016-09-05 DIAGNOSIS — R0789 Other chest pain: Secondary | ICD-10-CM | POA: Diagnosis not present

## 2016-09-05 DIAGNOSIS — R0609 Other forms of dyspnea: Secondary | ICD-10-CM | POA: Diagnosis not present

## 2016-09-05 DIAGNOSIS — I480 Paroxysmal atrial fibrillation: Secondary | ICD-10-CM

## 2016-09-05 DIAGNOSIS — Z01810 Encounter for preprocedural cardiovascular examination: Secondary | ICD-10-CM

## 2016-09-05 DIAGNOSIS — I2510 Atherosclerotic heart disease of native coronary artery without angina pectoris: Secondary | ICD-10-CM | POA: Diagnosis not present

## 2016-09-05 DIAGNOSIS — R0602 Shortness of breath: Secondary | ICD-10-CM | POA: Insufficient documentation

## 2016-09-05 NOTE — Progress Notes (Signed)
OFFICE CONSULT NOTE  Chief Complaint:  Preoperative risk assessment  Primary Care Physician: Freddrick March, MD  HPI:  Brian Burns is a 71 y.o. male who is being seen today for the evaluation of preoperative cardiac risk at the request of Freddrick March, MD. Mr. Calverley has as complex cardiac history and has previously been followed by [name]. He was last seen in hospital consultation in 2016 by Dr. Mayford Knife for new onset a-fib. His past medical history is significant for CVA, HTN, HLD, ED, BPH, CAD, PVD, AAA, DM2 and urethral stricture with recurrent UTI. In 2016 he had catheter-related UTI Which was associated with a-fib. Echo was performed which showed low normal LVEF of 50-55%. CHADSVASC score of 6. He was warfarin and remains on it. He has not had an ischemia evaluation in many years. His wife, who was with him today in the office, noted that he has been short of breath. He also reports some chest tightness with exertion that is relieved by rest. He is supposed to undergo a procedure to treat his urethral stricture.  PMHx:  Past Medical History:  Diagnosis Date  . AAA (abdominal aortic aneurysm) without rupture (HCC) 06/2014   no change in last exam from 2015  . Arthritis    knees,lower back  . Atrial fibrillation (HCC)   . Atrial flutter with rapid ventricular response (HCC) 01/16/2015  . CAD (coronary artery disease)   . Carotid artery disease (HCC)   . Chronic back pain   . Erectile dysfunction   . GERD (gastroesophageal reflux disease)   . History of CVA (cerebrovascular accident)    2009  &  2013  . HLD (hyperlipidemia)   . Hypertension   . Idiopathic pancreatitis 12/2014   acute  . PVD (peripheral vascular disease) with claudication (HCC)   . Type 2 diabetes mellitus (HCC)    no meds per pt    Past Surgical History:  Procedure Laterality Date  . CARDIOVASCULAR STRESS TEST  08-09-2009   mild global hypokinesis,  no ischemia or scar/  ef 41%  . CATARACT  EXTRACTION W/ INTRAOCULAR LENS  IMPLANT, BILATERAL  april and may 2014  . CYSTOSCOPY WITH RETROGRADE URETHROGRAM N/A 11/24/2014   Procedure: CYSTOSCOPY WITH RETROGRADE URETHROGRAM;  Surgeon: Crist Fat, MD;  Location: [facility];  Service: Urology;  Laterality: N/A;  . CYSTOSCOPY WITH URETHRAL DILATATION N/A 11/24/2014   Procedure: CYSTOSCOPY WITH URETHRAL DILATATION;  Surgeon: Crist Fat, MD;  Location[facility];  Service: Urology;  Laterality: N/A;  BALLOON DILATION        . ESOPHAGOGASTRODUODENOSCOPY N/A 05/15/2014   Procedure: ESOPHAGOGASTRODUODENOSCOPY (EGD);  Surgeon: Carman Ching, MD;  Location: [facility] ENDOSCOPY;  Service: Endoscopy;  Laterality: N/A;  . ESOPHAGOGASTRODUODENOSCOPY Left 02/02/2015   Procedure: ESOPHAGOGASTRODUODENOSCOPY (EGD);  Surgeon: Dorena Cookey, MD;  Location: [facility] ENDOSCOPY;  Service: Endoscopy;  Laterality: Left;  . ESOPHAGOGASTRODUODENOSCOPY Left 03/16/2015   Procedure: ESOPHAGOGASTRODUODENOSCOPY (EGD);  Surgeon: Dorena Cookey, MD;  Location: [facility]OSCOPY;  Service: Endoscopy;  Laterality: Left;  . TRANSTHORACIC ECHOCARDIOGRAM  01-07-2012   mild to moderate dilated LV,  ef 45-50%,  mild basal hypokinesis,  grade I diastolic  dysfunction/  trivial AR/  mild MR    FAMHx:  Family History  Problem Relation Age of Onset  . Lung disease Brother   . Kidney disease Brother   . Heart disease Brother        died at 32  . Heart disease Mother  died at 32  . Kidney disease Mother   . Lung disease Mother     SOCHx:   reports that he quit smoking about 12 years ago. His smoking use included Cigarettes. He has a 20.00 pack-year smoking history. He has never used smokeless tobacco. He reports that he does not drink alcohol or use drugs.  ALLERGIES:  Allergies  Allergen Reactions  . Novocain [Procaine Hcl] Nausea And Vomiting  . Calcium Carbonate     On MAR  . Doxycycline     On MAR    ROS: Pertinent items noted  in HPI and remainder of comprehensive ROS otherwise negative.  HOME MEDS: Current Outpatient Prescriptions on File Prior to Visit  Medication Sig Dispense Refill  . amLODipine (NORVASC) 10 MG tablet take 1 tablet by mouth once daily 90 tablet 0  . atorvastatin (LIPITOR) 40 MG tablet take 1 tablet by mouth once daily 90 tablet 3  . citalopram (CELEXA) 20 MG tablet take 1 tablet by mouth once daily AT 6PM 30 tablet 3  . ferrous sulfate 325 (65 FE) MG tablet take 1 tablet by mouth three times a day with meals 90 tablet 3  . finasteride (PROSCAR) 5 MG tablet take 1 tablet by mouth once daily 30 tablet 1  . gabapentin (NEURONTIN) 100 MG capsule take 1 capsule by mouth three times a day 180 capsule 2  . lisinopril (PRINIVIL,ZESTRIL) 5 MG tablet take 1 tablet by mouth once daily 30 tablet 1  . pantoprazole (PROTONIX) 40 MG tablet Take 1 tablet (40 mg total) by mouth daily. 30 tablet 0  . tamsulosin (FLOMAX) 0.4 MG CAPS capsule take 1 capsule by mouth once daily 30 capsule 3  . traMADol (ULTRAM) 50 MG tablet Take 1 tablet (50 mg total) by mouth every 8 (eight) hours as needed. 30 tablet 1  . warfarin (COUMADIN) 10 MG tablet take 1 tablet by mouth once daily 30 tablet 2   No current facility-administered medications on file prior to visit.     LABS/IMAGING: No results found for this or any previous visit (from the past 48 hour(s)). No results found.  LIPID PANEL:    Component Value Date/Time   CHOL 167 07/20/2015 1502   TRIG 129 07/20/2015 1502   HDL 51 07/20/2015 1502   CHOLHDL 3.3 07/20/2015 1502   VLDL 26 07/20/2015 1502   LDLCALC 90 07/20/2015 1502   LDLDIRECT 175 (H) 04/09/2011 1122    WEIGHTS: Wt Readings from Last 3 Encounters:  09/05/16 233 lb (105.7 kg)  08/26/16 228 lb (103.4 kg)  07/18/16 224 lb (101.6 kg)    VITALS: BP 110/78   Pulse (!) 52   Ht 6\' 4"  (1.93 m)   Wt 233 lb (105.7 kg)   BMI 28.36 kg/m   EXAM: General appearance: alert and no distress Neck: no  carotid bruit, no JVD and thyroid not enlarged, symmetric, no tenderness/mass/nodules Lungs: clear to auscultation bilaterally Heart: regular rate and rhythm, S1, S2 normal, no murmur, click, rub or gallop Abdomen: soft, non-tender; bowel sounds normal; no masses,  no organomegaly Extremities: extremities normal, atraumatic, no cyanosis or edema Pulses: 2+ and symmetric Skin: Skin color, texture, turgor normal. No rashes or lesions Neurologic: Grossly normal Psych: Pleasant  EKG: Sinus bradycardia 52, nonspecific T wave changes - personally reviewed  ASSESSMENT: 1. Indeterminate preoperative risk 2. Exertional chest pressure and dyspnea 3. Dyslipidemia 4. PAD- AAA 5. PAF on warfarin (CHADSVASC score of  6)  PLAN: 1.   Mr.  Huckeby has a number of medical problems and coronary risk equivalents. When pressed, he admits to chest pressure and DOE which is fairly new. Based on these symptoms, I would recommend a lexiscan myoview to risk stratify him for surgery. He would need to hold warfarin for 5 days prior to surgery. Plan to see him back after his myoview. Of note, his cholesterol is not a goal LDL<70. He is on atorvastatin 40 mg and we may need to consider adding ezetimibe.  Thanks for the consultation.  Chrystie Nose, MD, [facility]  [place]  [facility]  Attending Cardiologist  Direct Dial: [phone]  Fax: [phone]  Website:  [email or web address] 09/05/2016, 5:32 PM

## 2016-09-05 NOTE — Patient Instructions (Addendum)
Your physician has requested that you have a lexiscan myoview. For further information please visit [email or web address]. Please follow instruction sheet, as given.  Your physician recommends that you schedule a follow-up appointment with Dr. Hilty after your test.   

## 2016-09-09 ENCOUNTER — Ambulatory Visit (INDEPENDENT_AMBULATORY_CARE_PROVIDER_SITE_OTHER): Payer: Medicare Other | Admitting: *Deleted

## 2016-09-09 DIAGNOSIS — Z7901 Long term (current) use of anticoagulants: Secondary | ICD-10-CM | POA: Diagnosis not present

## 2016-09-09 LAB — POCT INR: INR: 2.1

## 2016-09-11 ENCOUNTER — Telehealth (HOSPITAL_COMMUNITY): Payer: Self-pay

## 2016-09-11 ENCOUNTER — Other Ambulatory Visit: Payer: Self-pay | Admitting: Family Medicine

## 2016-09-11 ENCOUNTER — Telehealth: Payer: Self-pay | Admitting: Internal Medicine

## 2016-09-11 NOTE — Telephone Encounter (Signed)
Follow Up   Patient daughter returning phone received call from [place] regarding her father. Requesting call back

## 2016-09-11 NOTE — Telephone Encounter (Signed)
Encounter complete. 

## 2016-09-12 ENCOUNTER — Ambulatory Visit (HOSPITAL_COMMUNITY)
Admission: RE | Admit: 2016-09-12 | Discharge: 2016-09-12 | Disposition: A | Discharge status: Home / Self Care | Payer: Medicare Other | Source: Ambulatory Visit | Attending: Cardiology | Admitting: Cardiology

## 2016-09-12 DIAGNOSIS — I714 Abdominal aortic aneurysm, without rupture: Secondary | ICD-10-CM | POA: Diagnosis not present

## 2016-09-12 DIAGNOSIS — I2510 Atherosclerotic heart disease of native coronary artery without angina pectoris: Secondary | ICD-10-CM

## 2016-09-12 DIAGNOSIS — R5383 Other fatigue: Secondary | ICD-10-CM | POA: Insufficient documentation

## 2016-09-12 DIAGNOSIS — N189 Chronic kidney disease, unspecified: Secondary | ICD-10-CM | POA: Diagnosis not present

## 2016-09-12 DIAGNOSIS — R42 Dizziness and giddiness: Secondary | ICD-10-CM | POA: Diagnosis not present

## 2016-09-12 DIAGNOSIS — Z8249 Family history of ischemic heart disease and other diseases of the circulatory system: Secondary | ICD-10-CM | POA: Insufficient documentation

## 2016-09-12 DIAGNOSIS — I129 Hypertensive chronic kidney disease with stage 1 through stage 4 chronic kidney disease, or unspecified chronic kidney disease: Secondary | ICD-10-CM | POA: Diagnosis not present

## 2016-09-12 DIAGNOSIS — Z8673 Personal history of transient ischemic attack (TIA), and cerebral infarction without residual deficits: Secondary | ICD-10-CM | POA: Insufficient documentation

## 2016-09-12 DIAGNOSIS — I739 Peripheral vascular disease, unspecified: Secondary | ICD-10-CM | POA: Insufficient documentation

## 2016-09-12 DIAGNOSIS — R0789 Other chest pain: Secondary | ICD-10-CM | POA: Diagnosis not present

## 2016-09-12 DIAGNOSIS — Z01810 Encounter for preprocedural cardiovascular examination: Secondary | ICD-10-CM

## 2016-09-12 DIAGNOSIS — Z87891 Personal history of nicotine dependence: Secondary | ICD-10-CM | POA: Insufficient documentation

## 2016-09-12 DIAGNOSIS — R531 Weakness: Secondary | ICD-10-CM | POA: Diagnosis not present

## 2016-09-12 DIAGNOSIS — R0609 Other forms of dyspnea: Secondary | ICD-10-CM | POA: Diagnosis not present

## 2016-09-12 DIAGNOSIS — I480 Paroxysmal atrial fibrillation: Secondary | ICD-10-CM | POA: Diagnosis not present

## 2016-09-12 MED ORDER — AMINOPHYLLINE 25 MG/ML IV SOLN
75.0000 mg | Freq: Once | INTRAVENOUS | Status: AC
  Administered 2016-09-12: 75 mg via INTRAVENOUS

## 2016-09-12 MED ORDER — REGADENOSON 0.4 MG/5ML IV SOLN
0.4000 mg | Freq: Once | INTRAVENOUS | Status: AC
  Administered 2016-09-12: 0.4 mg via INTRAVENOUS

## 2016-09-12 MED ORDER — TECHNETIUM TC 99M TETROFOSMIN IV KIT
32.1000 | PACK | Freq: Once | INTRAVENOUS | Status: AC | PRN
  Administered 2016-09-12: 32.1 via INTRAVENOUS
  Filled 2016-09-12: qty 33

## 2016-09-12 MED ORDER — TECHNETIUM TC 99M TETROFOSMIN IV KIT
9.9000 | PACK | Freq: Once | INTRAVENOUS | Status: AC | PRN
  Administered 2016-09-12: 9.9 via INTRAVENOUS
  Filled 2016-09-12: qty 10

## 2016-09-13 LAB — MYOCARDIAL PERFUSION IMAGING
CHL CUP RESTING HR STRESS: 41 {beats}/min
CSEPPHR: 80 {beats}/min
LV dias vol: 178 mL (ref 62–150)
LV sys vol: 97 mL
SDS: 2
SRS: 2
SSS: 4
TID: 1.17

## 2016-09-23 ENCOUNTER — Ambulatory Visit (INDEPENDENT_AMBULATORY_CARE_PROVIDER_SITE_OTHER): Payer: Medicare Other | Admitting: *Deleted

## 2016-09-23 ENCOUNTER — Other Ambulatory Visit: Payer: Self-pay | Admitting: Family Medicine

## 2016-09-23 DIAGNOSIS — Z7901 Long term (current) use of anticoagulants: Secondary | ICD-10-CM

## 2016-09-23 DIAGNOSIS — E1110 Type 2 diabetes mellitus with ketoacidosis without coma: Secondary | ICD-10-CM

## 2016-09-23 LAB — POCT INR: INR: 2.4

## 2016-09-24 ENCOUNTER — Encounter: Payer: Self-pay | Admitting: Internal Medicine

## 2016-09-24 ENCOUNTER — Ambulatory Visit (INDEPENDENT_AMBULATORY_CARE_PROVIDER_SITE_OTHER): Payer: Medicare Other | Admitting: Internal Medicine

## 2016-09-24 VITALS — BP 116/67 | HR 56 | Ht 76.0 in | Wt 233.0 lb

## 2016-09-24 DIAGNOSIS — I480 Paroxysmal atrial fibrillation: Secondary | ICD-10-CM | POA: Diagnosis not present

## 2016-09-24 DIAGNOSIS — I428 Other cardiomyopathies: Secondary | ICD-10-CM

## 2016-09-24 DIAGNOSIS — Z01810 Encounter for preprocedural cardiovascular examination: Secondary | ICD-10-CM

## 2016-09-24 DIAGNOSIS — Z79899 Other long term (current) drug therapy: Secondary | ICD-10-CM

## 2016-09-24 DIAGNOSIS — R0602 Shortness of breath: Secondary | ICD-10-CM | POA: Diagnosis not present

## 2016-09-24 MED ORDER — AMLODIPINE BESYLATE 5 MG PO TABS: ORAL_TABLET | ORAL | 1 refills | Status: DC

## 2016-09-24 MED ORDER — LISINOPRIL 10 MG PO TABS: 10.0000 mg | ORAL_TABLET | Freq: Every day | ORAL | 1 refills | Status: DC

## 2016-09-24 NOTE — Patient Instructions (Signed)
Your physician has recommended you make the following change in your medication:  -- INCREASE lisinopril to 10mg  daily -- DECREASE amlodipine to 5mg  daily  Your physician recommends that you return for lab work in ONE WEEK - BMET, BNP  Your physician has requested that you have an echocardiogram @ [address] - [address]. Echocardiography is a painless test that uses sound waves to create images of your heart. It provides your doctor with information about the size and shape of your heart and how well your heart's chambers and valves are working. This procedure takes approximately one hour. There are no restrictions for this procedure.  Your physician recommends that you schedule a follow-up appointment with Dr. Rennis Golden after your echo.

## 2016-09-25 ENCOUNTER — Encounter: Payer: Self-pay | Admitting: Podiatry

## 2016-09-25 ENCOUNTER — Ambulatory Visit (INDEPENDENT_AMBULATORY_CARE_PROVIDER_SITE_OTHER): Payer: Medicare Other | Admitting: Podiatry

## 2016-09-25 DIAGNOSIS — M79676 Pain in unspecified toe(s): Secondary | ICD-10-CM | POA: Diagnosis not present

## 2016-09-25 DIAGNOSIS — B351 Tinea unguium: Secondary | ICD-10-CM | POA: Diagnosis not present

## 2016-09-25 DIAGNOSIS — E1159 Type 2 diabetes mellitus with other circulatory complications: Secondary | ICD-10-CM | POA: Diagnosis not present

## 2016-09-25 DIAGNOSIS — I428 Other cardiomyopathies: Secondary | ICD-10-CM | POA: Insufficient documentation

## 2016-09-25 NOTE — Progress Notes (Signed)
Patient ID: Brian Burns, male   DOB: 1945-11-11, 71 y.o.   MRN: [id number] Complaint:  Visit Type: Patient returns to my office for continued preventative foot care services. Complaint: Patient states" my nails have grown long and thick and become painful to walk and wear shoes" Patient has been diagnosed with borderline diabetic with PVD and peripheral neuropathy.Marland Kitchen He presents for preventative foot care services. No changes to ROS  Podiatric Exam: Vascular: dorsalis pedis and posterior tibial pulses are palpable bilateral. Capillary return is immediate. Temperature gradient is WNL. Skin turgor WNL  Sensorium: Normal Semmes Weinstein monofilament test. Normal tactile sensation bilaterally. Nail Exam: Pt has thick disfigured discolored nails with subungual debris noted bilateral entire nail hallux through fifth toenails Ulcer Exam: There is no evidence of ulcer or pre-ulcerative changes or infection. Orthopedic Exam: Muscle tone and strength are WNL. No limitations in general ROM. No crepitus or effusions noted. Foot type and digits show no abnormalities. Bony prominences are unremarkable. Skin: No Porokeratosis. No infection or ulcers  Diagnosis:  Tinea unguium, Pain in right toe, pain in left toes  Treatment & Plan Procedures and Treatment: Consent by patient was obtained for treatment procedures. The patient understood the discussion of treatment and procedures well. All questions were answered thoroughly reviewed. Debridement of mycotic and hypertrophic toenails, 1 through 5 bilateral and clearing of subungual debris. No ulceration, no infection noted.  Return Visit-Office Procedure: Patient instructed to return to the office for a follow up visit 3 months for continued evaluation and treatment.  Helane Gunther DPM

## 2016-09-25 NOTE — Progress Notes (Signed)
OFFICE CONSULT NOTE  Chief Complaint:  Follow-up cardiovascular studies  Primary Care Physician: Freddrick March, MD  HPI:  Brian Burns is a 71 y.o. male who is being seen today for the evaluation of preoperative cardiac risk at the request of Freddrick March, MD. [name] has as complex cardiac history and has previously been followed by [name]. He was last seen in hospital consultation in 2016 by Dr. Mayford Knife for new onset a-fib. His past medical history is significant for CVA, HTN, HLD, ED, BPH, CAD, PVD, AAA, DM2 and urethral stricture with recurrent UTI. In 2016 he had catheter-related UTI Which was associated with a-fib. Echo was performed which showed low normal LVEF of 50-55%. CHADSVASC score of 6. He was warfarin and remains on it. He has not had an ischemia evaluation in many years. His wife, who was with him today in the office, noted that he has been short of breath. He also reports some chest tightness with exertion that is relieved by rest. He is supposed to undergo a procedure to treat his urethral stricture.  09/24/2016  Mr. Quigg returns today for follow-up of his cardiovascular studies. He underwent a [employer] which is considered low risk without ischemia however LVEF was 46%. Findings were consistent with nonischemic cardio myopathy. We discussed the findings today in follow-up and I felt that he would be at low risk for upcoming urethral surgery. He has a history of low normal EF of 50-55% in the past associated with A. fib. He has been short of breath however and there may be a component of congestive heart failure. I'm recommending additional lab work and will repeat an echocardiogram to see if his LVEF is truly reduced. We may need to add a diuretic however carefully given his history of urethral stricture.  PMHx:  Past Medical History:  Diagnosis Date  . AAA (abdominal aortic aneurysm) without rupture (HCC) 06/2014   no change in last exam from 2015  .  Arthritis    knees,lower back  . Atrial fibrillation (HCC)   . Atrial flutter with rapid ventricular response (HCC) 01/16/2015  . CAD (coronary artery disease)   . Carotid artery disease (HCC)   . Chronic back pain   . Erectile dysfunction   . GERD (gastroesophageal reflux disease)   . History of CVA (cerebrovascular accident)    2009  &  2013  . HLD (hyperlipidemia)   . Hypertension   . Idiopathic pancreatitis 12/2014   acute  . PVD (peripheral vascular disease) with claudication (HCC)   . Type 2 diabetes mellitus (HCC)    no meds per pt    Past Surgical History:  Procedure Laterality Date  . CARDIOVASCULAR STRESS TEST  08-09-2009   mild global hypokinesis,  no ischemia or scar/  ef 41%  . CATARACT EXTRACTION W/ INTRAOCULAR LENS  IMPLANT, BILATERAL  april and may 2014  . CYSTOSCOPY WITH RETROGRADE URETHROGRAM N/A 11/24/2014   Procedure: CYSTOSCOPY WITH RETROGRADE URETHROGRAM;  Surgeon: Crist Fat, MD;  Location: [facility];  Service: Urology;  Laterality: N/A;  . CYSTOSCOPY WITH URETHRAL DILATATION N/A 11/24/2014   Procedure: CYSTOSCOPY WITH URETHRAL DILATATION;  Surgeon: Crist Fat, MD;  [facility];  Service: Urology;  Laterality: N/A;  BALLOON DILATION        . ESOPHAGOGASTRODUODENOSCOPY N/A 05/15/2014   Procedure: ESOPHAGOGASTRODUODENOSCOPY (EGD);  Surgeon: Carman Ching, MD;  Location: [facility] ENDOSCOPY;  Service: Endoscopy;  Laterality: N/A;  . ESOPHAGOGASTRODUODENOSCOPY Left  02/02/2015   Procedure: ESOPHAGOGASTRODUODENOSCOPY (EGD);  Surgeon: Dorena Cookey, MD;  Location: [facility] ENDOSCOPY;  Service: Endoscopy;  Laterality: Left;  . ESOPHAGOGASTRODUODENOSCOPY Left 03/16/2015   Procedure: ESOPHAGOGASTRODUODENOSCOPY (EGD);  Surgeon: Dorena Cookey, MD;  Location: [facility];  Service: Endoscopy;  Laterality: Left;  . TRANSTHORACIC ECHOCARDIOGRAM  01-07-2012   mild to moderate dilated LV,  ef 45-50%,  mild basal hypokinesis,  grade I  diastolic  dysfunction/  trivial AR/  mild MR    FAMHx:  Family History  Problem Relation Age of Onset  . Lung disease Brother   . Kidney disease Brother   . Heart disease Brother        died at 74  . Heart disease Mother        died at 16  . Kidney disease Mother   . Lung disease Mother     SOCHx:   reports that he quit smoking about 12 years ago. His smoking use included Cigarettes. He has a 20.00 pack-year smoking history. He has never used smokeless tobacco. He reports that he does not drink alcohol or use drugs.  ALLERGIES:  Allergies  Allergen Reactions  . Novocain [Procaine Hcl] Nausea And Vomiting  . Calcium Carbonate     On MAR  . Doxycycline     On MAR    ROS: Pertinent items noted in HPI and remainder of comprehensive ROS otherwise negative.  HOME MEDS: Current Outpatient Prescriptions on File Prior to Visit  Medication Sig Dispense Refill  . atorvastatin (LIPITOR) 40 MG tablet take 1 tablet by mouth once daily 90 tablet 3  . citalopram (CELEXA) 20 MG tablet take 1 tablet by mouth once daily AT 6PM 30 tablet 3  . ferrous sulfate 325 (65 FE) MG tablet take 1 tablet by mouth three times a day with meals 90 tablet 3  . finasteride (PROSCAR) 5 MG tablet take 1 tablet by mouth once daily 30 tablet 1  . gabapentin (NEURONTIN) 100 MG capsule take 1 capsule by mouth three times a day 180 capsule 2  . pantoprazole (PROTONIX) 40 MG tablet Take 1 tablet (40 mg total) by mouth daily. 30 tablet 0  . tamsulosin (FLOMAX) 0.4 MG CAPS capsule take 1 capsule by mouth once daily 30 capsule 3  . traMADol (ULTRAM) 50 MG tablet Take 1 tablet (50 mg total) by mouth every 8 (eight) hours as needed. 30 tablet 1  . warfarin (COUMADIN) 10 MG tablet take 1 tablet by mouth once daily 30 tablet 2   No current facility-administered medications on file prior to visit.     LABS/IMAGING: No results found for this or any previous visit (from the past 48 hour(s)). No results found.  LIPID  PANEL:    Component Value Date/Time   CHOL 167 07/20/2015 1502   TRIG 129 07/20/2015 1502   HDL 51 07/20/2015 1502   CHOLHDL 3.3 07/20/2015 1502   VLDL 26 07/20/2015 1502   LDLCALC 90 07/20/2015 1502   LDLDIRECT 175 (H) 04/09/2011 1122    WEIGHTS: Wt Readings from Last 3 Encounters:  09/24/16 233 lb (105.7 kg)  09/12/16 233 lb (105.7 kg)  09/05/16 233 lb (105.7 kg)    VITALS: BP 116/67   Pulse (!) 56   Ht 6\' 4"  (1.93 m)   Wt 233 lb (105.7 kg)   SpO2 96%   BMI 28.36 kg/m   EXAM: Deferred  EKG: Deferred  ASSESSMENT: 1. Low preoperative risk - low risk myoview, with EF 46% - suspect NICM  2. Exertional chest pressure and dyspnea 3. Dyslipidemia 4. PAD- AAA 5. PAF on warfarin (CHADSVASC score of  6)  PLAN: 1.   Mr. Bramer likely has a mild nonischemic cardio myopathy. EF was 46%. We'll confirm that with echo however it's been low normal in the past. He may need to start on some diuretic. He is on amlodipine and lisinopril. He's been bradycardic and will not tolerate beta blocker due to heart rate ready in the low 50s. I advised decreasing his amlodipine from 10-5 mg and will increase lisinopril from 5-10 mg. Check a metabolic profile and BMP in 1 week. We may additionally add Lasix. His weight has been slowly going up which could indicate some volume overload.  Follow-up with me in 1 month.  Ok to schedule surgery from my standpoint.  Chrystie Nose, MD, [facility]  [place]  [facility]  Attending Cardiologist  Direct Dial: [phone]  Fax: [phone]  Website:  [email or web address]  Lisette Abu Atavia Poppe 09/25/2016, 10:26 AM

## 2016-09-26 ENCOUNTER — Telehealth: Payer: Self-pay | Admitting: Internal Medicine

## 2016-09-26 NOTE — Telephone Encounter (Signed)
[facility] requests clearance for: 1. Type of surgery: urethroplasty w/buccal graft, buccal mucosal graft harvest 2. Date of surgery: TBD 3. Surgeon: Dr. Marlou Porch 4. Medications that need to be held & how long: warfarin  5. Fax and/or Phone: ([phone]  (f) [phone]  Per Dr. Rennis Golden, patient is cleared for urology surgery: had low risk myoview, non-ischemic cardiomyopathy (EF 46%) - recommends to avoid volume overload - patient can hold warfarin 5 days prior to surgery  ** clearance note faxed

## 2016-10-01 ENCOUNTER — Ambulatory Visit (HOSPITAL_COMMUNITY): Payer: Medicare Other | Attending: Cardiology

## 2016-10-01 ENCOUNTER — Other Ambulatory Visit: Payer: Self-pay

## 2016-10-01 DIAGNOSIS — I428 Other cardiomyopathies: Secondary | ICD-10-CM | POA: Insufficient documentation

## 2016-10-01 DIAGNOSIS — I350 Nonrheumatic aortic (valve) stenosis: Secondary | ICD-10-CM | POA: Insufficient documentation

## 2016-10-01 DIAGNOSIS — I2510 Atherosclerotic heart disease of native coronary artery without angina pectoris: Secondary | ICD-10-CM | POA: Insufficient documentation

## 2016-10-01 DIAGNOSIS — Z8673 Personal history of transient ischemic attack (TIA), and cerebral infarction without residual deficits: Secondary | ICD-10-CM | POA: Insufficient documentation

## 2016-10-01 DIAGNOSIS — I77810 Thoracic aortic ectasia: Secondary | ICD-10-CM | POA: Diagnosis not present

## 2016-10-01 DIAGNOSIS — I429 Cardiomyopathy, unspecified: Secondary | ICD-10-CM | POA: Diagnosis present

## 2016-10-01 DIAGNOSIS — I119 Hypertensive heart disease without heart failure: Secondary | ICD-10-CM | POA: Insufficient documentation

## 2016-10-01 DIAGNOSIS — E785 Hyperlipidemia, unspecified: Secondary | ICD-10-CM | POA: Diagnosis not present

## 2016-10-01 DIAGNOSIS — R0602 Shortness of breath: Secondary | ICD-10-CM | POA: Diagnosis not present

## 2016-10-01 DIAGNOSIS — Z01810 Encounter for preprocedural cardiovascular examination: Secondary | ICD-10-CM | POA: Diagnosis not present

## 2016-10-01 DIAGNOSIS — R0600 Dyspnea, unspecified: Secondary | ICD-10-CM | POA: Diagnosis present

## 2016-10-02 ENCOUNTER — Telehealth: Payer: Self-pay | Admitting: Internal Medicine

## 2016-10-02 NOTE — Telephone Encounter (Signed)
Returned call to daughter Deatra Ina [facility]). Notified her of patient's echo results

## 2016-10-02 NOTE — Telephone Encounter (Signed)
Follow Up    Patient's daughter returning phone call from nurse. Requesting call back

## 2016-10-03 DIAGNOSIS — R0602 Shortness of breath: Secondary | ICD-10-CM | POA: Diagnosis not present

## 2016-10-03 DIAGNOSIS — Z79899 Other long term (current) drug therapy: Secondary | ICD-10-CM | POA: Diagnosis not present

## 2016-10-03 LAB — BASIC METABOLIC PANEL
BUN/Creatinine Ratio: 11 (ref 10–24)
BUN: 18 mg/dL (ref 8–27)
CALCIUM: 8.4 mg/dL — AB (ref 8.6–10.2)
CO2: 24 mmol/L (ref 20–29)
CREATININE: 1.62 mg/dL — AB (ref 0.76–1.27)
Chloride: 104 mmol/L (ref 96–106)
GFR calc Af Amer: 49 mL/min/{1.73_m2} — ABNORMAL LOW (ref >59)
GFR, EST NON AFRICAN AMERICAN: 43 mL/min/{1.73_m2} — AB (ref >59)
Glucose: 109 mg/dL — ABNORMAL HIGH (ref 65–99)
Potassium: 4 mmol/L (ref 3.5–5.2)
Sodium: 139 mmol/L (ref 134–144)

## 2016-10-03 LAB — PRO B NATRIURETIC PEPTIDE: NT-PRO BNP: 63 pg/mL (ref 0–376)

## 2016-10-04 ENCOUNTER — Telehealth: Payer: Self-pay | Admitting: Internal Medicine

## 2016-10-04 NOTE — Telephone Encounter (Signed)
Patient and daughter Deatra Ina notified of lab results. Med list updated.   Notes recorded by Chrystie Nose, MD on 10/04/2016 at 1:11 PM EDT BNP low -creatinine rising. Stop ACE-Inhibitor, increase amlodipine back to 10 mg daily.  Dr Rexene Edison

## 2016-10-07 ENCOUNTER — Encounter: Payer: Self-pay | Admitting: Family Medicine

## 2016-10-14 ENCOUNTER — Ambulatory Visit (INDEPENDENT_AMBULATORY_CARE_PROVIDER_SITE_OTHER): Payer: Medicare Other | Admitting: *Deleted

## 2016-10-14 DIAGNOSIS — Z7901 Long term (current) use of anticoagulants: Secondary | ICD-10-CM

## 2016-10-14 LAB — POCT INR: INR: 2.5

## 2016-10-19 ENCOUNTER — Other Ambulatory Visit: Payer: Self-pay | Admitting: Family Medicine

## 2016-10-21 NOTE — Telephone Encounter (Signed)
Will forward to new PCP  Bacigalupo, Marzella Schlein, MD, MPH [facility] 10/21/2016 8:23 AM

## 2016-10-24 ENCOUNTER — Other Ambulatory Visit: Payer: Self-pay | Admitting: Family Medicine

## 2016-10-24 NOTE — Telephone Encounter (Signed)
Will forward to new PCP  Sorin Frimpong, Marzella Schlein, MD, MPH [facility] 10/24/2016 9:33 AM

## 2016-11-03 ENCOUNTER — Other Ambulatory Visit: Payer: Self-pay | Admitting: Family Medicine

## 2016-11-03 IMAGING — CT CT CHEST W/O CM
2 of 4 series · 14 of 46 positions shown, 16 images · non-contrast
Comparison: Chest and abdominal radiographs 01/22/2015. Abdominal
ultrasound 01/16/2015. CT abdomen and pelvis 09/02/2012.

CLINICAL DATA: Epigastric pain and vomiting for 2 weeks. Recent
hospital admission with renal failure. Evaluation for mass.

EXAM:
CT CHEST, ABDOMEN AND PELVIS WITHOUT CONTRAST
TECHNIQUE: Multidetector CT imaging of the chest, abdomen and pelvis was
performed following the standard protocol without IV contrast.

[Series 2: cap w/o 5.0 mm st · axial · non-contrast · 0.76mm/px · z∈[+464,+1118]mm · 11 of 145 slices shown, 13 images]
[im 7/145  soft-tissue]
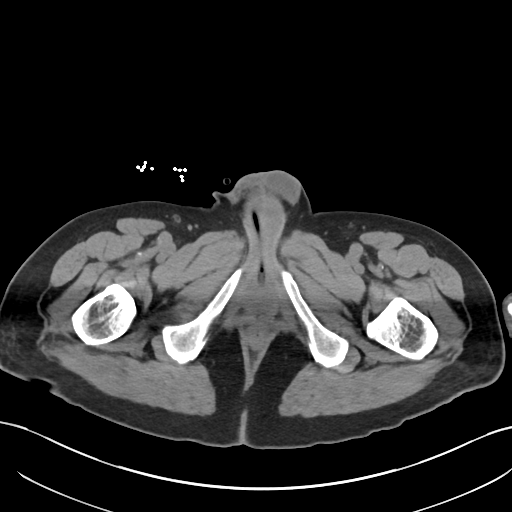
[im 7/145  bone]
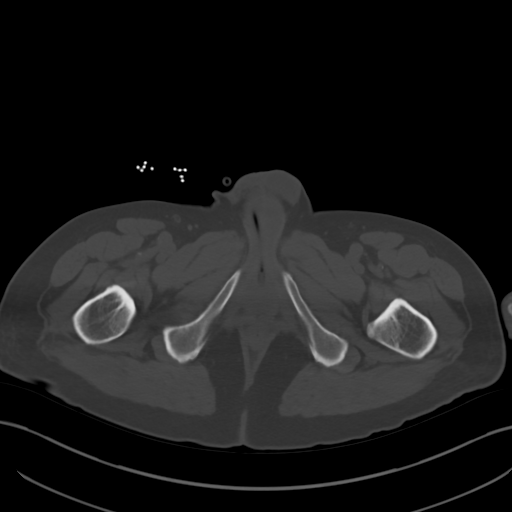
[im 21/145  soft-tissue]
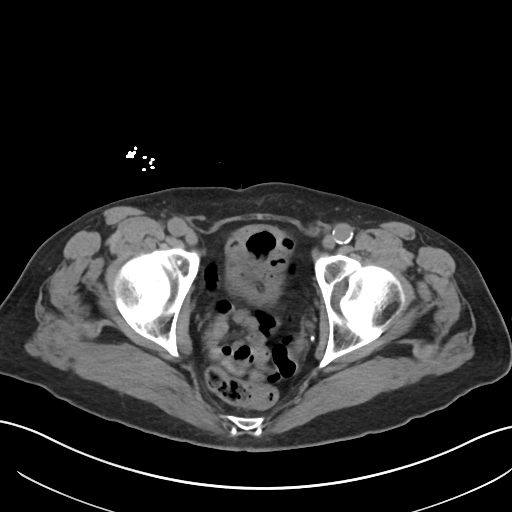
[im 35/145  soft-tissue]
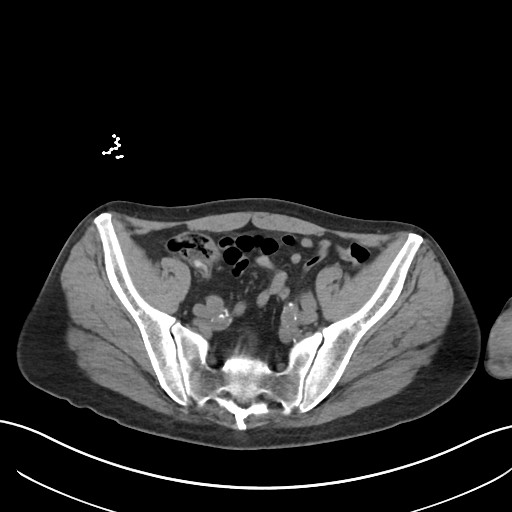
[im 49/145  soft-tissue]
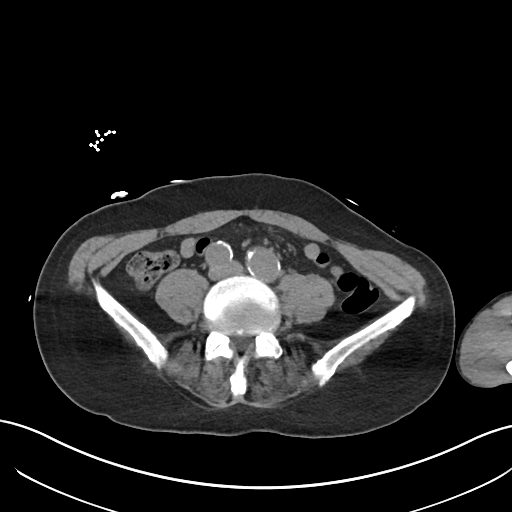
[im 62/145  soft-tissue]
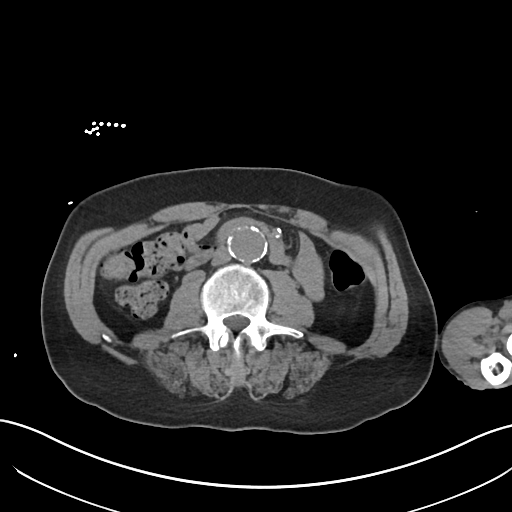
[im 76/145  soft-tissue]
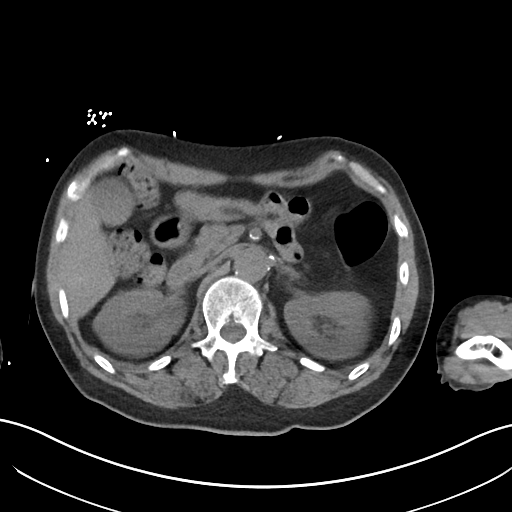
[im 83/145  soft-tissue]
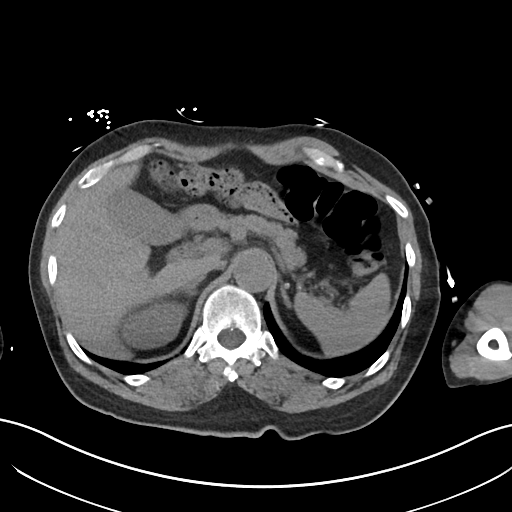
[im 97/145  soft-tissue]
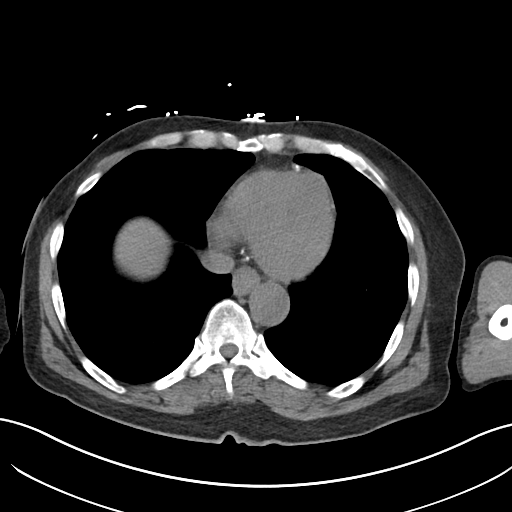
[im 110/145  soft-tissue]
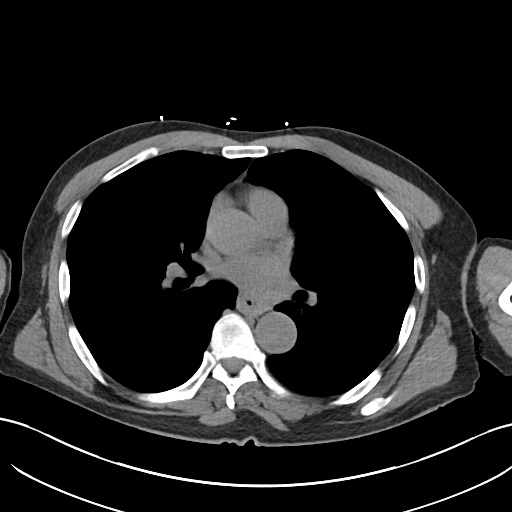
[im 110/145  bone]
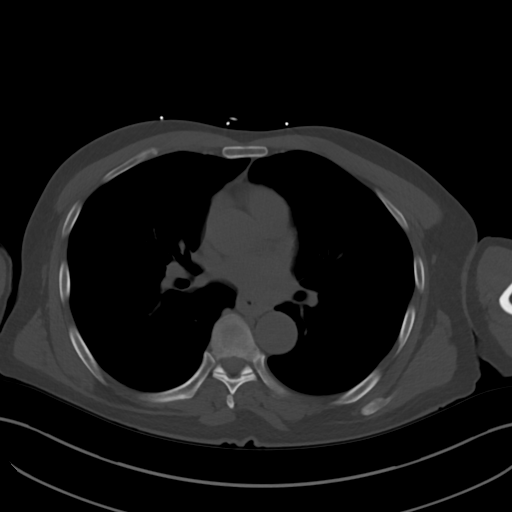
[im 124/145  soft-tissue]
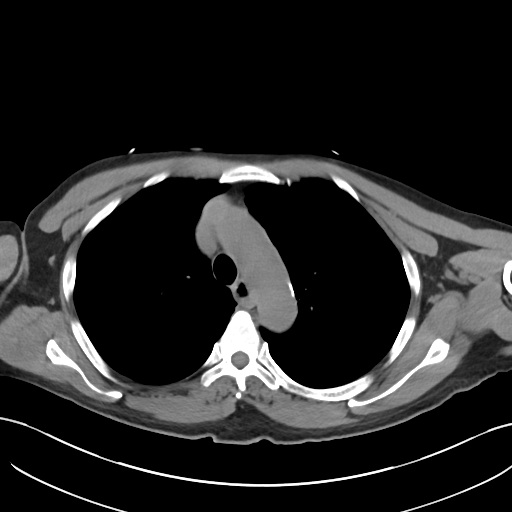
[im 138/145  soft-tissue]
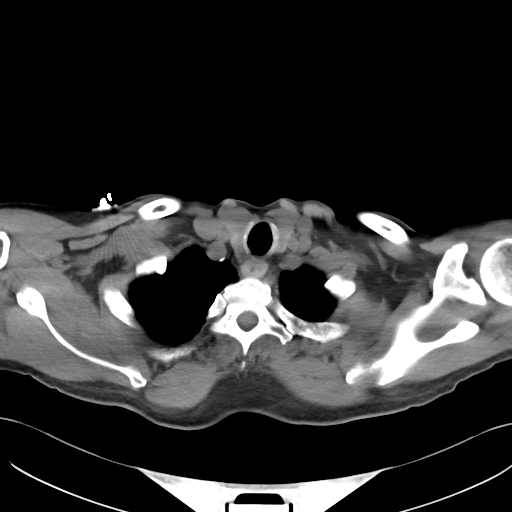

[Series 4: cap w/o 3.0 mm st cor · coronal · non-contrast · 0.80mm/px · 3 of 78 slices shown]
[im 26/78  soft-tissue]
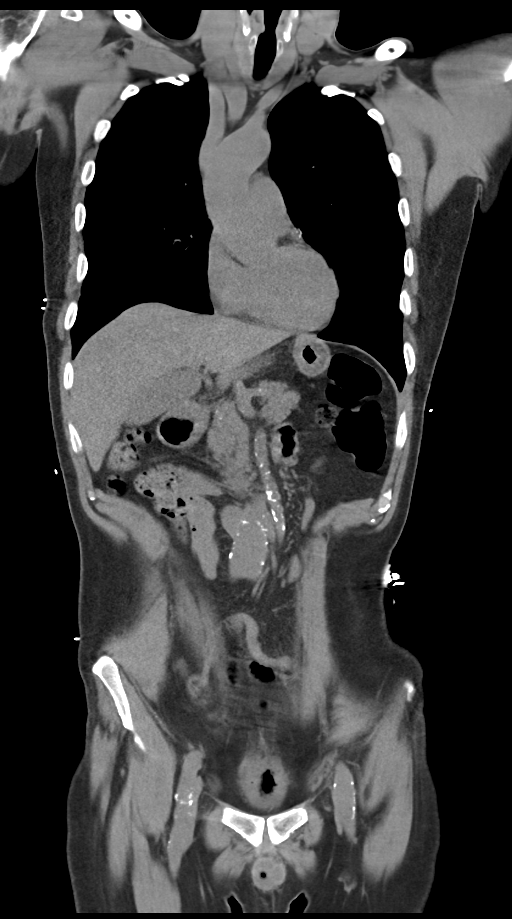
[im 35/78  soft-tissue]
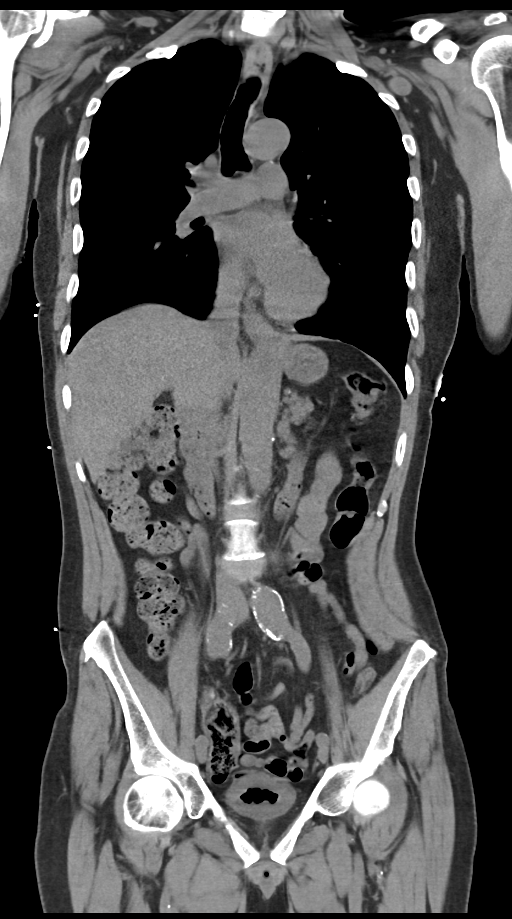
[im 43/78  soft-tissue]
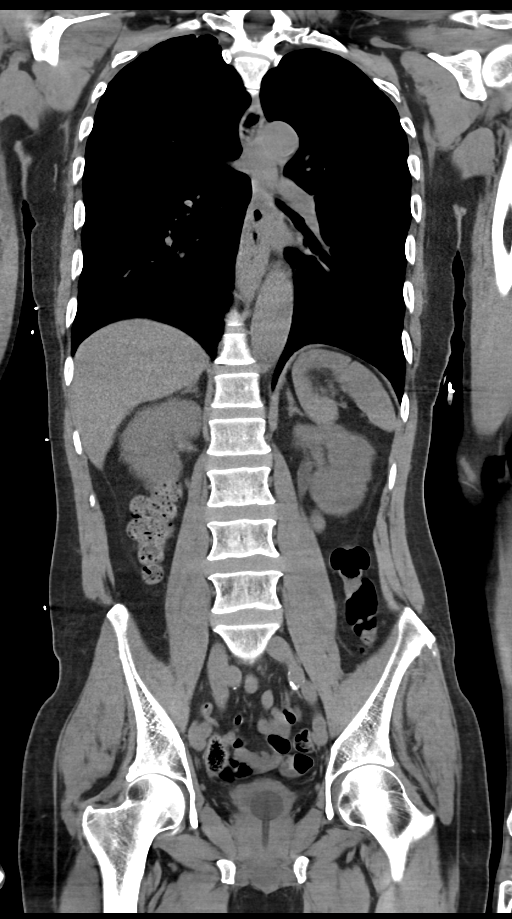

[14 of 46 positions shown; findings below may reference images not displayed]

FINDINGS: CT CHEST

No enlarged axillary, mediastinal, or hilar lymph nodes are
identified. The thoracic aorta is normal in caliber. The heart is
normal in size. LAD and left circumflex coronary artery
calcification is noted. There is no pleural or pericardial effusion.

Centrilobular emphysema is noted. There is a 3 mm nodule along the
inferior aspect of the left major fissure (series 3, image 41). A
cluster of small nodules is noted in the basilar right lower lobe
(series 3, image 57). No acute osseous abnormality is identified.

CT ABDOMEN AND PELVIS

The liver, spleen, adrenal glands, and kidneys have an unremarkable
unenhanced appearance. Subtly increased layering density in the
gallbladder likely reflects sludge described on the prior
ultrasound. There is no biliary dilatation. Small calcifications
near the pancreatic head are unchanged and may reflect prior
pancreatitis.

There is no evidence of bowel obstruction or inflammation. The
appendix is unremarkable. Mild aneurysmal dilatation of the
infrarenal abdominal aorta is again seen and measures up to 3.1 cm.
Common iliac arteries measure 2.0 cm on the right and 2.2 cm on the
left, similar to the prior CT.

No free fluid or enlarged lymph nodes are identified. A Foley
catheter and gas are present in the bladder with suggestion of wall
thickening. Gas is also seen in small anterior and superior bladder
wall outpouchings which filled with contrast on the prior CT. No
free fluid or enlarged lymph nodes are identified. There is trace
anterolisthesis of L3 on L4 which appears facet mediated.
IMPRESSION: 1. Cluster of small nodules in the basilar right lower lobe, likely
infectious/inflammatory. Additional 3 mm nodule in the left lower
lobe. If the patient is at high risk for bronchogenic carcinoma,
follow-up chest CT at 1 year is recommended. If the patient is at
low risk, no follow-up is needed. This recommendation follows the
consensus statement: Guidelines for Management of Small Pulmonary
Nodules Detected on CT Scans: A Statement from the [facility]
2. Unchanged calcifications near the pancreatic head which may
reflect prior pancreatitis.
3. Infrarenal abdominal aortic and bilateral common iliac artery
aneurysms.
4. Bladder wall thickening which may reflect cystitis.

## 2016-11-06 ENCOUNTER — Encounter: Payer: Self-pay | Admitting: Internal Medicine

## 2016-11-06 ENCOUNTER — Ambulatory Visit (INDEPENDENT_AMBULATORY_CARE_PROVIDER_SITE_OTHER): Payer: Medicare Other | Admitting: Internal Medicine

## 2016-11-06 VITALS — BP 116/70 | HR 50 | Ht 76.0 in | Wt 232.0 lb

## 2016-11-06 DIAGNOSIS — I480 Paroxysmal atrial fibrillation: Secondary | ICD-10-CM | POA: Diagnosis not present

## 2016-11-06 DIAGNOSIS — Z01810 Encounter for preprocedural cardiovascular examination: Secondary | ICD-10-CM | POA: Diagnosis not present

## 2016-11-06 DIAGNOSIS — I10 Essential (primary) hypertension: Secondary | ICD-10-CM

## 2016-11-06 DIAGNOSIS — I428 Other cardiomyopathies: Secondary | ICD-10-CM

## 2016-11-06 NOTE — Progress Notes (Signed)
OFFICE CONSULT NOTE  Chief Complaint:  Follow-up blood pressure  Primary Care Physician: Freddrick March, MD  HPI:  Brian Burns is a 71 y.o. male who is being seen today for the evaluation of preoperative cardiac risk at the request of Freddrick March, MD. Brian Burns has as complex cardiac history and has previously been followed by [name]. He was last seen in hospital consultation in 2016 by Dr. Mayford Knife for new onset a-fib. His past medical history is significant for CVA, HTN, HLD, ED, BPH, CAD, PVD, AAA, DM2 and urethral stricture with recurrent UTI. In 2016 he had catheter-related UTI Which was associated with a-fib. Echo was performed which showed low normal LVEF of 50-55%. CHADSVASC score of 6. He was warfarin and remains on it. He has not had an ischemia evaluation in many years. His wife, who was with him today in the office, noted that he has been short of breath. He also reports some chest tightness with exertion that is relieved by rest. He is supposed to undergo a procedure to treat his urethral stricture.  09/24/2016  Brian Burns returns today for follow-up of his cardiovascular studies. He underwent a [employer] which is considered low risk without ischemia however LVEF was 46%. Findings were consistent with nonischemic cardio myopathy. We discussed the findings today in follow-up and I felt that he would be at low risk for upcoming urethral surgery. He has a history of low normal EF of 50-55% in the past associated with A. fib. He has been short of breath however and there may be a component of congestive heart failure. I'm recommending additional lab work and will repeat an echocardiogram to see if his LVEF is truly reduced. We may need to add a diuretic however carefully given his history of urethral stricture.  11/06/2016  Brian Burns was seen today in follow-up. He underwent a Lexiscan Myoview which showed a reduced LVEF of 46% but no ischemia. Subsequently he had an echo  which showed a normal LVEF of 50-55%. Blood pressure is now better controlled. I've adjusted his medications however the ACE inhibitor cause an increase in creatinine. I discontinued that went back to his amlodipine and he seems to be doing well on that. From a cardiac standpoint he should be cleared for surgery.  PMHx:  Past Medical History:  Diagnosis Date  . AAA (abdominal aortic aneurysm) without rupture (HCC) 06/2014   no change in last exam from 2015  . Arthritis    knees,lower back  . Atrial fibrillation (HCC)   . Atrial flutter with rapid ventricular response (HCC) 01/16/2015  . CAD (coronary artery disease)   . Carotid artery disease (HCC)   . Chronic back pain   . Erectile dysfunction   . GERD (gastroesophageal reflux disease)   . History of CVA (cerebrovascular accident)    2009  &  2013  . HLD (hyperlipidemia)   . Hypertension   . Idiopathic pancreatitis 12/2014   acute  . PVD (peripheral vascular disease) with claudication (HCC)   . Type 2 diabetes mellitus (HCC)    no meds per pt    Past Surgical History:  Procedure Laterality Date  . CARDIOVASCULAR STRESS TEST  08-09-2009   mild global hypokinesis,  no ischemia or scar/  ef 41%  . CATARACT EXTRACTION W/ INTRAOCULAR LENS  IMPLANT, BILATERAL  april and may 2014  . CYSTOSCOPY WITH RETROGRADE URETHROGRAM N/A 11/24/2014   Procedure: CYSTOSCOPY WITH RETROGRADE URETHROGRAM;  Surgeon: Crist Fat, MD;  Location: [facility];  Service: Urology;  Laterality: N/A;  . CYSTOSCOPY WITH URETHRAL DILATATION N/A 11/24/2014   Procedure: CYSTOSCOPY WITH URETHRAL DILATATION;  Surgeon: Crist Fat, MD;  Location: [facility];  Service: Urology;  Laterality: N/A;  BALLOON DILATION        . ESOPHAGOGASTRODUODENOSCOPY N/A 05/15/2014   Procedure: ESOPHAGOGASTRODUODENOSCOPY (EGD);  Surgeon: Carman Ching, MD;  Location[facility]COPY;  Service: Endoscopy;  Laterality: N/A;  .  ESOPHAGOGASTRODUODENOSCOPY Left 02/02/2015   Procedure: ESOPHAGOGASTRODUODENOSCOPY (EGD);  Surgeon: Dorena Cookey, MD;  Location: [facility] ENDOSCOPY;  Service: Endoscopy;  Laterality: Left;  . ESOPHAGOGASTRODUODENOSCOPY Left 03/16/2015   Procedure: ESOPHAGOGASTRODUODENOSCOPY (EGD);  Surgeon: Dorena Cookey, MD;  Location: [facility];  Service: Endoscopy;  Laterality: Left;  . TRANSTHORACIC ECHOCARDIOGRAM  01-07-2012   mild to moderate dilated LV,  ef 45-50%,  mild basal hypokinesis,  grade I diastolic  dysfunction/  trivial AR/  mild MR    FAMHx:  Family History  Problem Relation Age of Onset  . Lung disease Brother   . Kidney disease Brother   . Heart disease Brother        died at 87  . Heart disease Mother        died at 98  . Kidney disease Mother   . Lung disease Mother     SOCHx:   reports that he quit smoking about 12 years ago. His smoking use included Cigarettes. He has a 20.00 pack-year smoking history. He has never used smokeless tobacco. He reports that he does not drink alcohol or use drugs.  ALLERGIES:  Allergies  Allergen Reactions  . Novocain [Procaine Hcl] Nausea And Vomiting  . Calcium Carbonate     On MAR  . Doxycycline     On MAR    ROS: Pertinent items noted in HPI and remainder of comprehensive ROS otherwise negative.  HOME MEDS: Current Outpatient Prescriptions on File Prior to Visit  Medication Sig Dispense Refill  . amLODipine (NORVASC) 10 MG tablet Take 10 mg by mouth daily.    Marland Kitchen amLODipine (NORVASC) 10 MG tablet take 1 tablet by mouth once daily 90 tablet 0  . atorvastatin (LIPITOR) 40 MG tablet take 1 tablet by mouth once daily 90 tablet 3  . citalopram (CELEXA) 20 MG tablet take 1 tablet by mouth once daily AT 6PM 30 tablet 3  . CREON 12000 units CPEP capsule take 1 capsule by mouth three times a day with meals 270 capsule 3  . ferrous sulfate 325 (65 FE) MG tablet take 1 tablet by mouth three times a day with meals 90 tablet 3  . finasteride (PROSCAR) 5  MG tablet take 1 tablet by mouth once daily 30 tablet 1  . gabapentin (NEURONTIN) 100 MG capsule take 1 capsule by mouth three times a day 180 capsule 2  . pantoprazole (PROTONIX) 40 MG tablet Take 1 tablet (40 mg total) by mouth daily. 30 tablet 0  . tamsulosin (FLOMAX) 0.4 MG CAPS capsule take 1 capsule by mouth once daily 30 capsule 3  . traMADol (ULTRAM) 50 MG tablet Take 1 tablet (50 mg total) by mouth every 8 (eight) hours as needed. 30 tablet 1  . warfarin (COUMADIN) 10 MG tablet take 1 tablet by mouth once daily 30 tablet 2   No current facility-administered medications on file prior to visit.     LABS/IMAGING: No results found for this or any previous visit (from the past 48 hour(s)). No results found.  LIPID  PANEL:    Component Value Date/Time   CHOL 167 07/20/2015 1502   TRIG 129 07/20/2015 1502   HDL 51 07/20/2015 1502   CHOLHDL 3.3 07/20/2015 1502   VLDL 26 07/20/2015 1502   LDLCALC 90 07/20/2015 1502   LDLDIRECT 175 (H) 04/09/2011 1122    WEIGHTS: Wt Readings from Last 3 Encounters:  11/06/16 232 lb (105.2 kg)  09/24/16 233 lb (105.7 kg)  09/12/16 233 lb (105.7 kg)    VITALS: BP 116/70   Pulse (!) 50   Ht 6\' 4"  (1.93 m)   Wt 232 lb (105.2 kg)   BMI 28.24 kg/m   EXAM: Deferred  EKG: Deferred  ASSESSMENT: 1. Low preoperative risk - low risk myoview, with EF 46%, 50-55% by echo 2. Exertional chest pressure - resolved 3. Dyslipidemia 4. PAD- AAA 5. PAF on warfarin (CHADSVASC score of  6)  PLAN: 1.   Brian Burns had an echo which showed improvement in LV function of 50-55%. He was placed on an ACE inhibitor however had an increase in creatinine. He seems to be doing better on amlodipine with good blood pressure control. We'll continue that medication I feel that he be at low risk for upcoming GU surgery. Follow-up with me in 6 months for blood pressure management and risk factor modification.  Chrystie Nose, MD, [facility]  [place]  [facility]    Attending Cardiologist  Direct Dial: [phone]  Fax: [phone]  Website:  [email or web address] 11/06/2016, 9:32 AM

## 2016-11-06 NOTE — Patient Instructions (Signed)
Your physician wants you to follow-up in: 6 months with Dr. Hilty. You will receive a reminder letter in the mail two months in advance. If you don't receive a letter, please call our office to schedule the follow-up appointment.    

## 2016-11-07 ENCOUNTER — Other Ambulatory Visit: Payer: Self-pay | Admitting: Family Medicine

## 2016-11-11 ENCOUNTER — Ambulatory Visit (INDEPENDENT_AMBULATORY_CARE_PROVIDER_SITE_OTHER): Payer: Medicare Other | Admitting: *Deleted

## 2016-11-11 DIAGNOSIS — Z7901 Long term (current) use of anticoagulants: Secondary | ICD-10-CM

## 2016-11-11 LAB — POCT INR: INR: 2.4

## 2016-11-15 ENCOUNTER — Other Ambulatory Visit: Payer: Self-pay | Admitting: Family Medicine

## 2016-11-15 NOTE — Telephone Encounter (Signed)
Will forward to PCP  Anabela Crayton, Marzella Schlein, MD, MPH [facility] 11/15/2016 9:36 AM

## 2016-12-05 ENCOUNTER — Other Ambulatory Visit: Payer: Self-pay | Admitting: Urology

## 2016-12-09 ENCOUNTER — Ambulatory Visit (INDEPENDENT_AMBULATORY_CARE_PROVIDER_SITE_OTHER): Payer: Medicare Other | Admitting: *Deleted

## 2016-12-09 DIAGNOSIS — Z7901 Long term (current) use of anticoagulants: Secondary | ICD-10-CM | POA: Diagnosis not present

## 2016-12-10 LAB — PROTIME-INR
INR: 2 — ABNORMAL HIGH (ref 0.8–1.2)
Prothrombin Time: 20.1 s — ABNORMAL HIGH (ref 9.1–12.0)

## 2016-12-10 LAB — POCT INR: INR: 2

## 2016-12-13 ENCOUNTER — Telehealth: Payer: Self-pay | Admitting: Family Medicine

## 2016-12-13 NOTE — Telephone Encounter (Signed)
[employer] called concerning a fax they sent to [place] on 11/8 requesting the pt's recent vital signs and medication list. They said they called earlier in the week and was told this information had been forwared to the doctor, but they havent received fax back yet. Their fax number is [phone] Attn: Harmon Dun. Please advise

## 2016-12-17 NOTE — Telephone Encounter (Signed)
I have placed the requested documents in the fax pile and she should be receiving them soon.

## 2016-12-24 IMAGING — DX DG CHEST 1V PORT
1 series · 1 of 1 positions shown · non-contrast
Comparison: 02/13/2015

CLINICAL DATA: Hematemesis.

EXAM:
PORTABLE CHEST 1 VIEW

[chest ap]
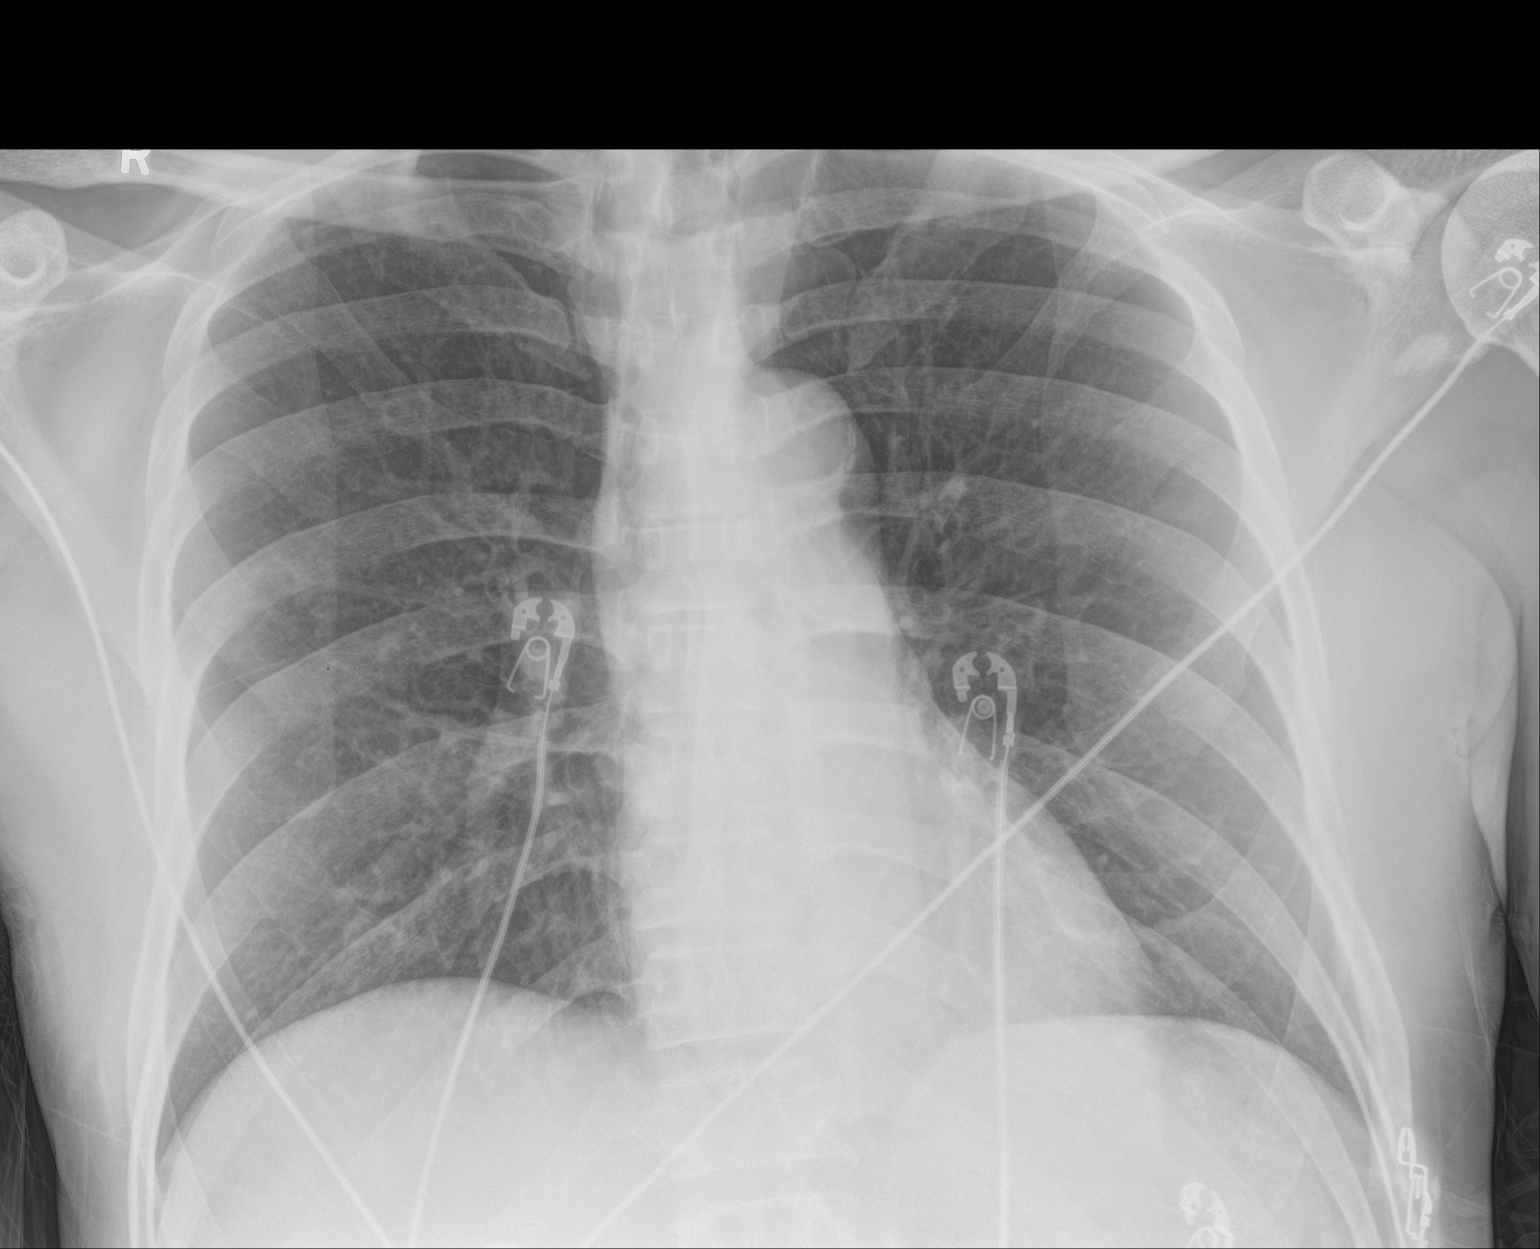

[1 of 1 positions shown; findings below may reference images not displayed]

FINDINGS: The heart size and mediastinal contours are within normal limits.
Both lungs are clear. Interval clearing of bilateral infiltrates.
The visualized skeletal structures are unremarkable.
IMPRESSION: No active disease.

## 2016-12-31 ENCOUNTER — Ambulatory Visit: Payer: Medicare Other | Admitting: Podiatry

## 2016-12-31 DIAGNOSIS — R350 Frequency of micturition: Secondary | ICD-10-CM | POA: Diagnosis not present

## 2016-12-31 DIAGNOSIS — N35013 Post-traumatic anterior urethral stricture: Secondary | ICD-10-CM | POA: Diagnosis not present

## 2017-01-03 ENCOUNTER — Ambulatory Visit (INDEPENDENT_AMBULATORY_CARE_PROVIDER_SITE_OTHER): Payer: Medicare Other | Admitting: *Deleted

## 2017-01-03 DIAGNOSIS — Z7901 Long term (current) use of anticoagulants: Secondary | ICD-10-CM

## 2017-01-03 LAB — POCT INR: INR: 2.3

## 2017-01-03 NOTE — Progress Notes (Signed)
Patient has a procedure on 01/17/2017. He was instructed to hold his coumadin 5 days prior. He will come back in to check his INR 5 days after re-starting his coumadin, around 01/24/2017. Brian Burns, Rodena Medin

## 2017-01-06 ENCOUNTER — Ambulatory Visit: Payer: Medicare Other

## 2017-01-07 ENCOUNTER — Ambulatory Visit: Payer: Medicare Other

## 2017-01-15 ENCOUNTER — Encounter: Payer: Self-pay | Admitting: Podiatry

## 2017-01-15 ENCOUNTER — Ambulatory Visit (INDEPENDENT_AMBULATORY_CARE_PROVIDER_SITE_OTHER): Payer: Medicare Other | Admitting: Podiatry

## 2017-01-15 DIAGNOSIS — M79674 Pain in right toe(s): Secondary | ICD-10-CM

## 2017-01-15 DIAGNOSIS — E1159 Type 2 diabetes mellitus with other circulatory complications: Secondary | ICD-10-CM

## 2017-01-15 DIAGNOSIS — B351 Tinea unguium: Secondary | ICD-10-CM

## 2017-01-15 DIAGNOSIS — M79675 Pain in left toe(s): Secondary | ICD-10-CM

## 2017-01-15 DIAGNOSIS — D689 Coagulation defect, unspecified: Secondary | ICD-10-CM

## 2017-01-15 NOTE — Patient Instructions (Signed)
Brian Burns  01/15/2017   Your procedure is scheduled on: 01-17-17  Report to [facility] Main  Entrance Take [place]  elevators to 3rd floor to  Short Stay Center at      0530 AM.    Call this number if you have problems the morning of surgery [phone]    Remember: ONLY 1 PERSON MAY GO WITH YOU TO SHORT STAY TO GET  READY MORNING OF YOUR SURGERY.  Do not eat food or drink liquids :After Midnight.     Take these medicines the morning of surgery with A SIP OF WATER:  DO NOT TAKE ANY DIABETIC MEDICATIONS DAY OF YOUR SURGERY                               You may not have any metal on your body including hair pins and              piercings  Do not wear jewelry, lotions, powders or perfumes, deodorant                       Men may shave face and neck.   Do not bring valuables to the hospital. [facility] IS NOT             RESPONSIBLE   FOR VALUABLES.  Contacts, dentures or bridgework may not be worn into surgery.  Leave suitcase in the car. After surgery it may be brought to your room.               Please read over the following fact sheets you were given: _____________________________________________________________________          [facility] - Preparing for Surgery Before surgery, you can play an important role.  Because skin is not sterile, your skin needs to be as free of germs as possible.  You can reduce the number of germs on your skin by washing with CHG (chlorahexidine gluconate) soap before surgery.  CHG is an antiseptic cleaner which kills germs and bonds with the skin to continue killing germs even after washing. Please DO NOT use if you have an allergy to CHG or antibacterial soaps.  If your skin becomes reddened/irritated stop using the CHG and inform your nurse when you arrive at Short Stay. Do not shave (including legs and underarms) for at least 48 hours prior to the first CHG shower.  You may shave your face/neck. Please follow these  instructions carefully:  1.  Shower with CHG Soap the night before surgery and the  morning of Surgery.  2.  If you choose to wash your hair, wash your hair first as usual with your  normal  shampoo.  3.  After you shampoo, rinse your hair and body thoroughly to remove the  shampoo.                           4.  Use CHG as you would any other liquid soap.  You can apply chg directly  to the skin and wash                       Gently with a scrungie or clean washcloth.  5.  Apply the CHG Soap to your body ONLY FROM THE NECK DOWN.  Do not use on face/ open                           Wound or open sores. Avoid contact with eyes, ears mouth and genitals (private parts).                       Wash face,  Genitals (private parts) with your normal soap.             6.  Wash thoroughly, paying special attention to the area where your surgery  will be performed.  7.  Thoroughly rinse your body with warm water from the neck down.  8.  DO NOT shower/wash with your normal soap after using and rinsing off  the CHG Soap.                9.  Pat yourself dry with a clean towel.            10.  Wear clean pajamas.            11.  Place clean sheets on your bed the night of your first shower and do not  sleep with pets. Day of Surgery : Do not apply any lotions/deodorants the morning of surgery.  Please wear clean clothes to the hospital/surgery center.  FAILURE TO FOLLOW THESE INSTRUCTIONS MAY RESULT IN THE CANCELLATION OF YOUR SURGERY PATIENT SIGNATURE_________________________________  NURSE SIGNATURE__________________________________  ________________________________________________________________________

## 2017-01-15 NOTE — Progress Notes (Signed)
Clearance Dr. Rennis Golden 11-06-16  EKG 09-05-16 Epic   ECHO 10-21-16 Epic   STRESS 09-13-16 Epic

## 2017-01-15 NOTE — Progress Notes (Signed)
Patient ID: Brian Burns, male   DOB: [birth year], 71 y.o.   MRN: [id number] Complaint:  Visit Type: Patient returns to my office for continued preventative foot care services. Complaint: Patient states" my nails have grown long and thick and become painful to walk and wear shoes" Patient has been diagnosed with borderline diabetic with PVD and peripheral neuropathy.Marland Kitchen He presents for preventative foot care services. No changes to ROS.  Patient is taking coumadin. Podiatric Exam: Vascular: dorsalis pedis and posterior tibial pulses are palpable bilateral. Capillary return is immediate. Temperature gradient is WNL. Skin turgor WNL  Sensorium: Normal Semmes Weinstein monofilament test. Normal tactile sensation bilaterally. Nail Exam: Pt has thick disfigured discolored nails with subungual debris noted bilateral entire nail hallux through fifth toenails Ulcer Exam: There is no evidence of ulcer or pre-ulcerative changes or infection. Orthopedic Exam: Muscle tone and strength are WNL. No limitations in general ROM. No crepitus or effusions noted. Foot type and digits show no abnormalities. Bony prominences are unremarkable. Skin: No Porokeratosis. No infection or ulcers  Diagnosis:  Tinea unguium, Pain in right toe, pain in left toes  Treatment & Plan Procedures and Treatment: Consent by patient was obtained for treatment procedures. The patient understood the discussion of treatment and procedures well. All questions were answered thoroughly reviewed. Debridement of mycotic and hypertrophic toenails, 1 through 5 bilateral and clearing of subungual debris. No ulceration, no infection noted.  Return Visit-Office Procedure: Patient instructed to return to the office for a follow up visit 3 months for continued evaluation and treatment.  Helane Gunther DPM

## 2017-01-16 ENCOUNTER — Emergency Department (HOSPITAL_COMMUNITY)
Admission: EM | Admit: 2017-01-16 | Discharge: 2017-01-16 | Discharge status: Absent without leave | Payer: Medicare Other

## 2017-01-16 ENCOUNTER — Encounter (HOSPITAL_COMMUNITY)
Admission: RE | Admit: 2017-01-16 | Discharge: 2017-01-16 | Disposition: A | Discharge status: Home / Self Care | Payer: Medicare Other | Source: Ambulatory Visit | Attending: Urology | Admitting: Urology

## 2017-01-16 ENCOUNTER — Encounter (HOSPITAL_COMMUNITY): Payer: Self-pay

## 2017-01-16 ENCOUNTER — Other Ambulatory Visit: Payer: Self-pay

## 2017-01-16 DIAGNOSIS — Z7901 Long term (current) use of anticoagulants: Secondary | ICD-10-CM | POA: Diagnosis not present

## 2017-01-16 DIAGNOSIS — H409 Unspecified glaucoma: Secondary | ICD-10-CM | POA: Diagnosis not present

## 2017-01-16 DIAGNOSIS — K219 Gastro-esophageal reflux disease without esophagitis: Secondary | ICD-10-CM | POA: Diagnosis not present

## 2017-01-16 DIAGNOSIS — I2510 Atherosclerotic heart disease of native coronary artery without angina pectoris: Secondary | ICD-10-CM | POA: Diagnosis not present

## 2017-01-16 DIAGNOSIS — I10 Essential (primary) hypertension: Secondary | ICD-10-CM | POA: Diagnosis not present

## 2017-01-16 DIAGNOSIS — Z79899 Other long term (current) drug therapy: Secondary | ICD-10-CM | POA: Diagnosis not present

## 2017-01-16 DIAGNOSIS — Z7982 Long term (current) use of aspirin: Secondary | ICD-10-CM | POA: Diagnosis not present

## 2017-01-16 DIAGNOSIS — N35919 Unspecified urethral stricture, male, unspecified site: Secondary | ICD-10-CM | POA: Diagnosis not present

## 2017-01-16 DIAGNOSIS — Z8673 Personal history of transient ischemic attack (TIA), and cerebral infarction without residual deficits: Secondary | ICD-10-CM | POA: Diagnosis not present

## 2017-01-16 DIAGNOSIS — Z87891 Personal history of nicotine dependence: Secondary | ICD-10-CM | POA: Diagnosis not present

## 2017-01-16 DIAGNOSIS — E7800 Pure hypercholesterolemia, unspecified: Secondary | ICD-10-CM | POA: Diagnosis not present

## 2017-01-16 DIAGNOSIS — I4891 Unspecified atrial fibrillation: Secondary | ICD-10-CM | POA: Diagnosis not present

## 2017-01-16 HISTORY — DX: Headache: R51

## 2017-01-16 HISTORY — DX: Headache, unspecified: R51.9

## 2017-01-16 HISTORY — DX: Prediabetes: R73.03

## 2017-01-16 HISTORY — DX: Dyspnea, unspecified: R06.00

## 2017-01-16 LAB — HEMOGLOBIN A1C
Hgb A1c MFr Bld: 6.8 % — ABNORMAL HIGH (ref 4.8–5.6)
MEAN PLASMA GLUCOSE: 148.46 mg/dL

## 2017-01-16 LAB — BASIC METABOLIC PANEL
Anion gap: 5 (ref 5–15)
BUN: 22 mg/dL — AB (ref 6–20)
CHLORIDE: 107 mmol/L (ref 101–111)
CO2: 25 mmol/L (ref 22–32)
Calcium: 8.4 mg/dL — ABNORMAL LOW (ref 8.9–10.3)
Creatinine, Ser: 1.56 mg/dL — ABNORMAL HIGH (ref 0.61–1.24)
GFR calc Af Amer: 51 mL/min — ABNORMAL LOW (ref >60)
GFR calc non Af Amer: 44 mL/min — ABNORMAL LOW (ref >60)
GLUCOSE: 111 mg/dL — AB (ref 65–99)
POTASSIUM: 3.9 mmol/L (ref 3.5–5.1)
Sodium: 137 mmol/L (ref 135–145)

## 2017-01-16 LAB — CBC
HEMATOCRIT: 32.2 % — AB (ref 39.0–52.0)
Hemoglobin: 10.4 g/dL — ABNORMAL LOW (ref 13.0–17.0)
MCH: 28.3 pg (ref 26.0–34.0)
MCHC: 32.3 g/dL (ref 30.0–36.0)
MCV: 87.5 fL (ref 78.0–100.0)
Platelets: 142 10*3/uL — ABNORMAL LOW (ref 150–400)
RBC: 3.68 MIL/uL — ABNORMAL LOW (ref 4.22–5.81)
RDW: 16.4 % — AB (ref 11.5–15.5)
WBC: 5.7 10*3/uL (ref 4.0–10.5)

## 2017-01-16 LAB — PROTIME-INR
INR: 1.22
Prothrombin Time: 15.3 seconds — ABNORMAL HIGH (ref 11.4–15.2)

## 2017-01-16 LAB — SURGICAL PCR SCREEN
MRSA, PCR: NEGATIVE
Staphylococcus aureus: NEGATIVE

## 2017-01-16 NOTE — Progress Notes (Addendum)
PT Cbc and bmp done 01-16-17 routed to Dr. Marlou Porch via epic

## 2017-01-16 NOTE — Progress Notes (Signed)
Pt. Picked up by [employer] ride assist after preop in a blue Nissan Rogue at [facility] hospital  Main entrance

## 2017-01-17 ENCOUNTER — Inpatient Hospital Stay (HOSPITAL_COMMUNITY)
Admission: RE | Admit: 2017-01-17 | Discharge: 2017-01-18 | DRG: 672 | Disposition: A | Discharge status: Home / Self Care | Payer: Medicare Other | Source: Ambulatory Visit | Attending: Urology | Admitting: Urology

## 2017-01-17 ENCOUNTER — Inpatient Hospital Stay (HOSPITAL_COMMUNITY): Payer: Medicare Other | Admitting: Certified Registered Nurse Anesthetist

## 2017-01-17 ENCOUNTER — Encounter (HOSPITAL_COMMUNITY)
Admission: RE | Disposition: A | Discharge status: Home / Self Care | Payer: Self-pay | Source: Ambulatory Visit | Attending: Urology

## 2017-01-17 ENCOUNTER — Encounter (HOSPITAL_COMMUNITY): Payer: Self-pay | Admitting: Emergency Medicine

## 2017-01-17 DIAGNOSIS — I4891 Unspecified atrial fibrillation: Secondary | ICD-10-CM | POA: Diagnosis present

## 2017-01-17 DIAGNOSIS — I10 Essential (primary) hypertension: Secondary | ICD-10-CM | POA: Diagnosis not present

## 2017-01-17 DIAGNOSIS — F329 Major depressive disorder, single episode, unspecified: Secondary | ICD-10-CM | POA: Diagnosis not present

## 2017-01-17 DIAGNOSIS — N35919 Unspecified urethral stricture, male, unspecified site: Principal | ICD-10-CM | POA: Diagnosis present

## 2017-01-17 DIAGNOSIS — Z7982 Long term (current) use of aspirin: Secondary | ICD-10-CM

## 2017-01-17 DIAGNOSIS — N37 Urethral disorders in diseases classified elsewhere: Secondary | ICD-10-CM | POA: Diagnosis present

## 2017-01-17 DIAGNOSIS — I2510 Atherosclerotic heart disease of native coronary artery without angina pectoris: Secondary | ICD-10-CM | POA: Diagnosis not present

## 2017-01-17 DIAGNOSIS — I714 Abdominal aortic aneurysm, without rupture: Secondary | ICD-10-CM | POA: Diagnosis not present

## 2017-01-17 DIAGNOSIS — Z79899 Other long term (current) drug therapy: Secondary | ICD-10-CM

## 2017-01-17 DIAGNOSIS — Z7901 Long term (current) use of anticoagulants: Secondary | ICD-10-CM | POA: Diagnosis not present

## 2017-01-17 DIAGNOSIS — I480 Paroxysmal atrial fibrillation: Secondary | ICD-10-CM | POA: Diagnosis not present

## 2017-01-17 DIAGNOSIS — Z8673 Personal history of transient ischemic attack (TIA), and cerebral infarction without residual deficits: Secondary | ICD-10-CM

## 2017-01-17 DIAGNOSIS — G459 Transient cerebral ischemic attack, unspecified: Secondary | ICD-10-CM | POA: Diagnosis not present

## 2017-01-17 DIAGNOSIS — H409 Unspecified glaucoma: Secondary | ICD-10-CM | POA: Diagnosis present

## 2017-01-17 DIAGNOSIS — E7800 Pure hypercholesterolemia, unspecified: Secondary | ICD-10-CM | POA: Diagnosis not present

## 2017-01-17 DIAGNOSIS — K219 Gastro-esophageal reflux disease without esophagitis: Secondary | ICD-10-CM | POA: Diagnosis not present

## 2017-01-17 DIAGNOSIS — N35013 Post-traumatic anterior urethral stricture: Secondary | ICD-10-CM | POA: Diagnosis not present

## 2017-01-17 DIAGNOSIS — Z87891 Personal history of nicotine dependence: Secondary | ICD-10-CM | POA: Diagnosis not present

## 2017-01-17 HISTORY — PX: CYSTOSCOPY: SHX5120

## 2017-01-17 HISTORY — PX: URETHROPLASTY: SHX499

## 2017-01-17 LAB — PROTIME-INR
INR: 1.21
PROTHROMBIN TIME: 15.2 s (ref 11.4–15.2)

## 2017-01-17 SURGERY — REPAIR, URETER
Anesthesia: General | Site: Urethra

## 2017-01-17 MED ORDER — TAMSULOSIN HCL 0.4 MG PO CAPS
0.4000 mg | ORAL_CAPSULE | Freq: Every day | ORAL | Status: DC
  Administered 2017-01-18: 0.4 mg via ORAL
  Filled 2017-01-17: qty 1

## 2017-01-17 MED ORDER — MEPERIDINE HCL 50 MG/ML IJ SOLN: 6.2500 mg | INTRAMUSCULAR | Status: DC | PRN

## 2017-01-17 MED ORDER — BUPIVACAINE-EPINEPHRINE (PF) 0.5% -1:200000 IJ SOLN
INTRAMUSCULAR | Status: AC
  Filled 2017-01-17: qty 30

## 2017-01-17 MED ORDER — STERILE WATER FOR IRRIGATION IR SOLN
Status: DC | PRN
  Administered 2017-01-17: 3000 mL

## 2017-01-17 MED ORDER — TRAMADOL HCL 50 MG PO TABS: 50.0000 mg | ORAL_TABLET | Freq: Four times a day (QID) | ORAL | 0 refills | Status: DC | PRN

## 2017-01-17 MED ORDER — ATORVASTATIN CALCIUM 40 MG PO TABS
40.0000 mg | ORAL_TABLET | Freq: Every day | ORAL | Status: DC
  Administered 2017-01-18: 40 mg via ORAL
  Filled 2017-01-17: qty 1

## 2017-01-17 MED ORDER — LACTATED RINGERS IV SOLN: INTRAVENOUS | Status: DC

## 2017-01-17 MED ORDER — HYDROMORPHONE HCL 1 MG/ML IJ SOLN
0.5000 mg | INTRAMUSCULAR | Status: DC | PRN
  Administered 2017-01-17 (×2): 1 mg via INTRAVENOUS
  Filled 2017-01-17 (×3): qty 1

## 2017-01-17 MED ORDER — EPHEDRINE SULFATE-NACL 50-0.9 MG/10ML-% IV SOSY
PREFILLED_SYRINGE | INTRAVENOUS | Status: DC | PRN
  Administered 2017-01-17: 15 mg via INTRAVENOUS

## 2017-01-17 MED ORDER — ONDANSETRON HCL 4 MG/2ML IJ SOLN: 4.0000 mg | INTRAMUSCULAR | Status: DC | PRN

## 2017-01-17 MED ORDER — OXYBUTYNIN CHLORIDE ER 5 MG PO TB24
10.0000 mg | ORAL_TABLET | Freq: Every day | ORAL | Status: DC
  Administered 2017-01-17 – 2017-01-18 (×2): 10 mg via ORAL
  Filled 2017-01-17 (×2): qty 2

## 2017-01-17 MED ORDER — LEVOFLOXACIN 500 MG PO TABS: 500.0000 mg | ORAL_TABLET | Freq: Every day | ORAL | 0 refills | Status: AC

## 2017-01-17 MED ORDER — AMLODIPINE BESYLATE 10 MG PO TABS
10.0000 mg | ORAL_TABLET | Freq: Every day | ORAL | Status: DC
Start: 2017-01-18 — End: 2017-01-18
  Administered 2017-01-18: 10 mg via ORAL
  Filled 2017-01-17: qty 1

## 2017-01-17 MED ORDER — CHLORHEXIDINE GLUCONATE 0.12 % MT SOLN: 15.0000 mL | Freq: Four times a day (QID) | OROMUCOSAL | Status: DC | PRN

## 2017-01-17 MED ORDER — FENTANYL CITRATE (PF) 100 MCG/2ML IJ SOLN
INTRAMUSCULAR | Status: AC
  Filled 2017-01-17: qty 2

## 2017-01-17 MED ORDER — PROMETHAZINE HCL 25 MG/ML IJ SOLN: 6.2500 mg | INTRAMUSCULAR | Status: DC | PRN

## 2017-01-17 MED ORDER — CEFAZOLIN SODIUM-DEXTROSE 1-4 GM/50ML-% IV SOLN
1.0000 g | Freq: Three times a day (TID) | INTRAVENOUS | Status: AC
  Administered 2017-01-17 – 2017-01-18 (×2): 1 g via INTRAVENOUS
  Filled 2017-01-17 (×2): qty 50

## 2017-01-17 MED ORDER — CIPROFLOXACIN HCL 500 MG PO TABS
500.0000 mg | ORAL_TABLET | Freq: Two times a day (BID) | ORAL | Status: AC
  Administered 2017-01-17: 500 mg via ORAL
  Filled 2017-01-17: qty 1

## 2017-01-17 MED ORDER — GLYCOPYRROLATE 0.2 MG/ML IV SOSY
PREFILLED_SYRINGE | INTRAVENOUS | Status: AC
  Filled 2017-01-17: qty 5

## 2017-01-17 MED ORDER — BUPIVACAINE-EPINEPHRINE 0.5% -1:200000 IJ SOLN
INTRAMUSCULAR | Status: DC | PRN
  Administered 2017-01-17: 30 mL

## 2017-01-17 MED ORDER — ROCURONIUM BROMIDE 10 MG/ML (PF) SYRINGE
PREFILLED_SYRINGE | INTRAVENOUS | Status: DC | PRN
  Administered 2017-01-17: 50 mg via INTRAVENOUS
  Administered 2017-01-17: 20 mg via INTRAVENOUS

## 2017-01-17 MED ORDER — PROPOFOL 10 MG/ML IV BOLUS
INTRAVENOUS | Status: AC
  Filled 2017-01-17: qty 20

## 2017-01-17 MED ORDER — SENNOSIDES-DOCUSATE SODIUM 8.6-50 MG PO TABS
2.0000 | ORAL_TABLET | Freq: Every day | ORAL | Status: DC
  Administered 2017-01-17: 2 via ORAL
  Filled 2017-01-17: qty 2

## 2017-01-17 MED ORDER — HYDROMORPHONE HCL 1 MG/ML IJ SOLN: 0.2500 mg | INTRAMUSCULAR | Status: DC | PRN

## 2017-01-17 MED ORDER — SUGAMMADEX SODIUM 200 MG/2ML IV SOLN
INTRAVENOUS | Status: AC
  Filled 2017-01-17: qty 2

## 2017-01-17 MED ORDER — FENTANYL CITRATE (PF) 250 MCG/5ML IJ SOLN
INTRAMUSCULAR | Status: DC | PRN
  Administered 2017-01-17: 100 ug via INTRAVENOUS
  Administered 2017-01-17 (×2): 50 ug via INTRAVENOUS
  Administered 2017-01-17: 100 ug via INTRAVENOUS
  Administered 2017-01-17: 50 ug via INTRAVENOUS

## 2017-01-17 MED ORDER — CIPROFLOXACIN IN D5W 400 MG/200ML IV SOLN
INTRAVENOUS | Status: AC
  Filled 2017-01-17: qty 200

## 2017-01-17 MED ORDER — PANTOPRAZOLE SODIUM 40 MG PO TBEC
40.0000 mg | DELAYED_RELEASE_TABLET | Freq: Two times a day (BID) | ORAL | Status: DC
  Administered 2017-01-17 – 2017-01-18 (×2): 40 mg via ORAL
  Filled 2017-01-17 (×3): qty 1

## 2017-01-17 MED ORDER — SUGAMMADEX SODIUM 200 MG/2ML IV SOLN
INTRAVENOUS | Status: DC | PRN
  Administered 2017-01-17: 200 mg via INTRAVENOUS

## 2017-01-17 MED ORDER — FENTANYL CITRATE (PF) 250 MCG/5ML IJ SOLN
INTRAMUSCULAR | Status: AC
  Filled 2017-01-17: qty 5

## 2017-01-17 MED ORDER — DEXTROSE-NACL 5-0.45 % IV SOLN
INTRAVENOUS | Status: DC
  Administered 2017-01-17: 14:00:00 via INTRAVENOUS

## 2017-01-17 MED ORDER — FINASTERIDE 5 MG PO TABS
5.0000 mg | ORAL_TABLET | Freq: Every day | ORAL | Status: DC
  Administered 2017-01-17 – 2017-01-18 (×2): 5 mg via ORAL
  Filled 2017-01-17 (×2): qty 1

## 2017-01-17 MED ORDER — CLINDAMYCIN PHOSPHATE 600 MG/50ML IV SOLN
600.0000 mg | Freq: Once | INTRAVENOUS | Status: AC
  Administered 2017-01-17: 600 mg via INTRAVENOUS
  Filled 2017-01-17: qty 50

## 2017-01-17 MED ORDER — POLYMYXIN B SULFATE 500000 UNITS IJ SOLR
INTRAMUSCULAR | Status: AC
  Filled 2017-01-17: qty 500000

## 2017-01-17 MED ORDER — CEFAZOLIN SODIUM-DEXTROSE 2-4 GM/100ML-% IV SOLN
INTRAVENOUS | Status: AC
  Filled 2017-01-17: qty 100

## 2017-01-17 MED ORDER — ROCURONIUM BROMIDE 50 MG/5ML IV SOSY
PREFILLED_SYRINGE | INTRAVENOUS | Status: AC
  Filled 2017-01-17: qty 5

## 2017-01-17 MED ORDER — GLYCOPYRROLATE 0.2 MG/ML IJ SOLN
INTRAMUSCULAR | Status: DC | PRN
  Administered 2017-01-17: 0.2 mg via INTRAVENOUS

## 2017-01-17 MED ORDER — PROPOFOL 10 MG/ML IV BOLUS
INTRAVENOUS | Status: DC | PRN
  Administered 2017-01-17: 140 mg via INTRAVENOUS

## 2017-01-17 MED ORDER — ONDANSETRON HCL 4 MG/2ML IJ SOLN
INTRAMUSCULAR | Status: DC | PRN
  Administered 2017-01-17: 4 mg via INTRAVENOUS

## 2017-01-17 MED ORDER — MIDAZOLAM HCL 2 MG/2ML IJ SOLN
INTRAMUSCULAR | Status: AC
  Filled 2017-01-17: qty 2

## 2017-01-17 MED ORDER — 0.9 % SODIUM CHLORIDE (POUR BTL) OPTIME
TOPICAL | Status: DC | PRN
  Administered 2017-01-17: 1000 mL

## 2017-01-17 MED ORDER — EPHEDRINE 5 MG/ML INJ
INTRAVENOUS | Status: AC
  Filled 2017-01-17: qty 10

## 2017-01-17 MED ORDER — LACTATED RINGERS IV SOLN
INTRAVENOUS | Status: DC | PRN
  Administered 2017-01-17 (×2): via INTRAVENOUS

## 2017-01-17 MED ORDER — CEFAZOLIN SODIUM-DEXTROSE 2-4 GM/100ML-% IV SOLN
2.0000 g | INTRAVENOUS | Status: AC
  Administered 2017-01-17: 2 g via INTRAVENOUS

## 2017-01-17 MED ORDER — OXYCODONE HCL 5 MG PO TABS
5.0000 mg | ORAL_TABLET | ORAL | Status: DC | PRN
  Administered 2017-01-17 – 2017-01-18 (×2): 5 mg via ORAL
  Filled 2017-01-17 (×2): qty 1

## 2017-01-17 MED ORDER — ONDANSETRON HCL 4 MG/2ML IJ SOLN
INTRAMUSCULAR | Status: AC
  Filled 2017-01-17: qty 2

## 2017-01-17 MED ORDER — CIPROFLOXACIN IN D5W 400 MG/200ML IV SOLN: 400.0000 mg | INTRAVENOUS | Status: DC

## 2017-01-17 MED ORDER — LIDOCAINE 2% (20 MG/ML) 5 ML SYRINGE
INTRAMUSCULAR | Status: AC
  Filled 2017-01-17: qty 5

## 2017-01-17 MED ORDER — POLYMYXIN B SULFATE 500000 UNITS IJ SOLR
INTRAMUSCULAR | Status: DC | PRN
  Administered 2017-01-17: 500 mL

## 2017-01-17 MED ORDER — ACETAMINOPHEN 500 MG PO TABS
1000.0000 mg | ORAL_TABLET | Freq: Four times a day (QID) | ORAL | Status: DC
  Administered 2017-01-17 – 2017-01-18 (×3): 1000 mg via ORAL
  Filled 2017-01-17 (×3): qty 2

## 2017-01-17 MED ORDER — LIDOCAINE 2% (20 MG/ML) 5 ML SYRINGE
INTRAMUSCULAR | Status: DC | PRN
  Administered 2017-01-17: 80 mg via INTRAVENOUS

## 2017-01-17 MED ORDER — GABAPENTIN 100 MG PO CAPS
100.0000 mg | ORAL_CAPSULE | Freq: Three times a day (TID) | ORAL | Status: DC
  Administered 2017-01-17 – 2017-01-18 (×3): 100 mg via ORAL
  Filled 2017-01-17 (×3): qty 1

## 2017-01-17 SURGICAL SUPPLY — 69 items
BAG URINE DRAINAGE (UROLOGICAL SUPPLIES) ×3 IMPLANT
BLADE 15 SAFETY STRL DISP (BLADE) ×3 IMPLANT
BLADE HEX COATED 2.75 (ELECTRODE) IMPLANT
BLADE SURG 15 STRL LF DISP TIS (BLADE) ×2 IMPLANT
BLADE SURG 15 STRL SS (BLADE) ×4
BLADE SURG SZ10 CARB STEEL (BLADE) IMPLANT
BNDG GAUZE ELAST 4 BULKY (GAUZE/BANDAGES/DRESSINGS) IMPLANT
BRIEF STRETCH FOR OB PAD LRG (UNDERPADS AND DIAPERS) ×6 IMPLANT
CATH FOLEY 2WAY SLVR  5CC 14FR (CATHETERS)
CATH FOLEY 2WAY SLVR  5CC 18FR (CATHETERS) ×2
CATH FOLEY 2WAY SLVR 5CC 14FR (CATHETERS) IMPLANT
CATH FOLEY 2WAY SLVR 5CC 18FR (CATHETERS) ×1 IMPLANT
CATH INTERMIT  6FR 70CM (CATHETERS) ×3 IMPLANT
CATH ROBINSON RED A/P 16FR (CATHETERS) IMPLANT
CATH ROBINSON RED A/P 18FR (CATHETERS) IMPLANT
CATH ROBINSON RED A/P 20FR (CATHETERS) ×3 IMPLANT
CLOSURE WOUND 1/2 X4 (GAUZE/BANDAGES/DRESSINGS)
COVER SURGICAL LIGHT HANDLE (MISCELLANEOUS) ×3 IMPLANT
DERMABOND ADVANCED (GAUZE/BANDAGES/DRESSINGS) ×2
DERMABOND ADVANCED .7 DNX12 (GAUZE/BANDAGES/DRESSINGS) ×1 IMPLANT
DRAIN PENROSE 18X1/4 LTX STRL (WOUND CARE) ×3 IMPLANT
DRAPE INCISE IOBAN 66X45 STRL (DRAPES) IMPLANT
DRAPE SHEET LG 3/4 BI-LAMINATE (DRAPES) ×3 IMPLANT
DRSG TELFA 3X8 NADH (GAUZE/BANDAGES/DRESSINGS) ×3 IMPLANT
ELECT PENCIL ROCKER SW 15FT (MISCELLANEOUS) ×6 IMPLANT
ELECT REM PT RETURN 15FT ADLT (MISCELLANEOUS) ×6 IMPLANT
GAUZE SPONGE 4X4 16PLY XRAY LF (GAUZE/BANDAGES/DRESSINGS) ×15 IMPLANT
GLOVE BIOGEL M STRL SZ7.5 (GLOVE) ×21 IMPLANT
GOWN STRL REUS W/TWL XL LVL3 (GOWN DISPOSABLE) ×9 IMPLANT
GUIDEWIRE STR DUAL SENSOR (WIRE) ×3 IMPLANT
HOLDER FOLEY CATH W/STRAP (MISCELLANEOUS) ×3 IMPLANT
KIT BASIN OR (CUSTOM PROCEDURE TRAY) ×3 IMPLANT
LOOP VESSEL MAXI BLUE (MISCELLANEOUS) ×3 IMPLANT
MARKER SKIN DUAL TIP RULER LAB (MISCELLANEOUS) ×6 IMPLANT
NEEDLE HYPO 22GX1.5 SAFETY (NEEDLE) ×3 IMPLANT
NEEDLE HYPO 25X1 1.5 SAFETY (NEEDLE) ×3 IMPLANT
PACK CYSTO (CUSTOM PROCEDURE TRAY) ×3 IMPLANT
PACKING VAGINAL (PACKING) ×3 IMPLANT
PAK SCROTO (SET/KITS/TRAYS/PACK) IMPLANT
PLUG CATH AND CAP STER (CATHETERS) ×3 IMPLANT
SHEET LAVH (DRAPES) ×3 IMPLANT
SOL PREP PROV IODINE SCRUB 4OZ (MISCELLANEOUS) ×6 IMPLANT
SPONGE LAP 4X18 X RAY DECT (DISPOSABLE) ×3 IMPLANT
STAPLER VISISTAT 35W (STAPLE) IMPLANT
STRIP CLOSURE SKIN 1/2X4 (GAUZE/BANDAGES/DRESSINGS) IMPLANT
SUT CHROMIC 2 0 SH (SUTURE) ×3 IMPLANT
SUT CHROMIC 3 0 PS 2 (SUTURE) IMPLANT
SUT CHROMIC 3 0 SH 27 (SUTURE) IMPLANT
SUT CHROMIC 4 0 PS 2 18 (SUTURE) IMPLANT
SUT CHROMIC 4 0 SH 27 (SUTURE) IMPLANT
SUT MNCRL AB 4-0 PS2 18 (SUTURE) ×3 IMPLANT
SUT SILK 2 0 30  PSL (SUTURE)
SUT SILK 2 0 30 PSL (SUTURE) IMPLANT
SUT SILK 3 0 SH CR/8 (SUTURE) IMPLANT
SUT VIC AB 2-0 SH 27 (SUTURE)
SUT VIC AB 2-0 SH 27X BRD (SUTURE) IMPLANT
SUT VIC AB 3-0 SH 27 (SUTURE) ×6
SUT VIC AB 3-0 SH 27XBRD (SUTURE) ×3 IMPLANT
SUT VIC AB 4-0 SH 27 (SUTURE) ×8
SUT VIC AB 4-0 SH 27XBRD (SUTURE) ×4 IMPLANT
SYR 10ML LL (SYRINGE) ×9 IMPLANT
SYR CONTROL 10ML LL (SYRINGE) ×6 IMPLANT
TOWEL OR 17X26 10 PK STRL BLUE (TOWEL DISPOSABLE) ×6 IMPLANT
TOWEL OR NON WOVEN STRL DISP B (DISPOSABLE) IMPLANT
TUBE CONNECTING VINYL 14FR 30C (MISCELLANEOUS) ×3 IMPLANT
WATER STERILE IRR 1000ML POUR (IV SOLUTION) IMPLANT
WATER STERILE IRR 500ML POUR (IV SOLUTION) ×3 IMPLANT
YANKAUER SUCT BULB TIP 10FT TU (MISCELLANEOUS) ×6 IMPLANT
YANKAUER SUCT BULB TIP NO VENT (SUCTIONS) IMPLANT

## 2017-01-17 NOTE — Anesthesia Preprocedure Evaluation (Addendum)
Anesthesia Evaluation  Patient identified by MRN, date of birth, ID band Patient awake    Reviewed: Allergy & Precautions, NPO status , Patient's Chart, lab work & pertinent test results  Airway Mallampati: I  TM Distance: >3 FB Neck ROM: Full    Dental  (+) Edentulous Upper, Edentulous Lower   Pulmonary former smoker,    breath sounds clear to auscultation       Cardiovascular hypertension, Pt. on medications + CAD and + Peripheral Vascular Disease  + dysrhythmias Atrial Fibrillation  Rhythm:Regular Rate:Bradycardia     Neuro/Psych  Headaches, PSYCHIATRIC DISORDERS Depression TIA   GI/Hepatic GERD  Medicated,  Endo/Other  diabetes  Renal/GU Renal disease     Musculoskeletal  (+) Arthritis , Osteoarthritis,    Abdominal Normal abdominal exam  (+)   Peds  Hematology   Anesthesia Other Findings   Reproductive/Obstetrics                            Lab Results  Component Value Date   WBC 5.7 01/16/2017   HGB 10.4 (L) 01/16/2017   HCT 32.2 (L) 01/16/2017   MCV 87.5 01/16/2017   PLT 142 (L) 01/16/2017   Lab Results  Component Value Date   CREATININE 1.56 (H) 01/16/2017   BUN 22 (H) 01/16/2017   NA 137 01/16/2017   K 3.9 01/16/2017   CL 107 01/16/2017   CO2 25 01/16/2017   Lab Results  Component Value Date   INR 1.21 01/17/2017   INR 1.22 01/16/2017   INR 2.3 01/03/2017   EKG: sinus bradycardia.  Echo: Left ventricle: The cavity size was mildly dilated. Wall   thickness was increased in a pattern of mild LVH. Systolic   function was normal. The estimated ejection fraction was in the   range of 50% to 55%. There is hypokinesis of the inferolateral   myocardium. Doppler parameters are consistent with abnormal left   ventricular relaxation (grade 1 diastolic dysfunction). Doppler   parameters are consistent with high ventricular filling pressure. - Aortic valve: There was trivial  regurgitation. - Aortic root: The aortic root was mildly dilated. - Ascending aorta: The ascending aorta was mildly dilated. - Mitral valve: Calcified annulus. - Right ventricle: The cavity size was mildly dilated.  Anesthesia Physical Anesthesia Plan  ASA: III  Anesthesia Plan: General   Post-op Pain Management:    Induction: Intravenous  PONV Risk Score and Plan: 3 and Ondansetron, Dexamethasone and Midazolam  Airway Management Planned: Oral ETT  Additional Equipment:   Intra-op Plan:   Post-operative Plan: Extubation in OR  Informed Consent: I have reviewed the patients History and Physical, chart, labs and discussed the procedure including the risks, benefits and alternatives for the proposed anesthesia with the patient or authorized representative who has indicated his/her understanding and acceptance.   Dental advisory given  Plan Discussed with: CRNA  Anesthesia Plan Comments:        Anesthesia Quick Evaluation

## 2017-01-17 NOTE — Transfer of Care (Signed)
Immediate Anesthesia Transfer of Care Note  Patient: Brian Burns  Procedure(s) Performed: URETHEROPLASTY BUCCAL GRAFT AND BUCCAL MUCOSA GRAFT HARVEST (N/A Urethra) CYSTOSCOPY FLEXIBLE (N/A Urethra)  Patient Location: PACU  Anesthesia Type:General  Level of Consciousness: awake, alert  and oriented  Airway & Oxygen Therapy: Patient Spontanous Breathing and Patient connected to face mask oxygen  Post-op Assessment: Report given to RN and Post -op Vital signs reviewed and stable  Post vital signs: Reviewed and stable  Last Vitals:  Vitals:   01/17/17 0528  BP: (!) 147/86  Pulse: (!) 58  Resp: 18  Temp: 36.8 C  SpO2: 98%    Last Pain:  Vitals:   01/17/17 0555  TempSrc:   PainSc: 2       Patients Stated Pain Goal: 4 (01/17/17 0555)  Complications: No apparent anesthesia complications

## 2017-01-17 NOTE — Discharge Instructions (Signed)
· Activity:  You are encouraged to ambulate frequently (about every hour during waking hours) to help prevent blood clots from forming in your legs or lungs.  However, you should not engage in any heavy lifting (> 10-15 lbs), strenuous activity, or straining. ° °· Diet: You should advance your diet as instructed by your physician.  It will be normal to have some bloating, nausea, and abdominal discomfort intermittently. ° °· Prescriptions:  You will be provided a prescription for pain medication to take as needed.  If your pain is not severe enough to require the prescription pain medication, you may take extra strength Tylenol instead which will have less side effects.  You should also take a prescribed stool softener to avoid straining with bowel movements as the prescription pain medication may constipate you. ° °· Incisions: You may remove your dressing bandages 48 hours after surgery if not removed in the hospital.  You will either have some small staples or special tissue glue at each of the incision sites. Once the bandages are removed (if present), the incisions may stay open to air.  You may start showering (but not soaking or bathing in water) the 2nd day after surgery and the incisions simply need to be patted dry after the shower.  No additional care is needed. ° °· What to call us about: You should call the office ([phone]) if you develop fever > 101 or develop persistent vomiting. Activity:  You are encouraged to ambulate frequently (about every hour during waking hours) to help prevent blood clots from forming in your legs or lungs.  However, you should not engage in any heavy lifting (> 10-15 lbs), strenuous activity, or straining. ° ° °Foley Catheter Care, Adult °A soft, flexible tube (Foley catheter) has been placed in your bladder. This may be done to temporarily help with urine drainage after an operation or to relieve blockage from an enlarged prostate gland. °HOME CARE INSTRUCTIONS  °If  you are going home with a Foley catheter in place, follow these instructions: °Taking Care of the Catheter: °· Keep the area where the catheter leaves your body clean. °· Attach the catheter to the leg so there is no tension on the catheter. °· Keep the drainage bag below the level of the bladder, but keep it OFF the floor. °· Do not take long soaking baths.  °· Wash your hands before touching ANYTHING related to the catheter or bag. °· Using mild soap and warm water on a washcloth: °· Clean the area closest to the catheter insertion site using a circular motion around the catheter. °· Clean the catheter itself by wiping AWAY from the insertion site for several inches down the tube. °· NEVER wipe upward as this could sweep bacteria up into the urethra (tube in your body that normally drains the bladder) and cause infection. °Taking Care of the Drainage Bags: °· Two drainage bags will be taken home: a large overnight drainage bag, and a smaller leg bag which fits underneath clothing. °· It is okay to wear the overnight bag at any time, but NEVER wear the smaller leg bag at night. °· Keep the drainage bag well below the level of your bladder. This prevents backflow of urine into the bladder and allows the urine to drain freely. °· Anchor the tubing to your leg to prevent pulling or tension on the catheter. Use tape or a leg strap provided by the hospital. °· Empty the drainage bag when it is ½ to ¾   full. Wash your hands before and after touching the bag. °· Periodically check the tubing for kinks to make sure there is no pressure on the tubing which could restrict the flow of urine. °Changing the Drainage Bags: °· Cleanse both ends of the clean bag with alcohol before changing. °· Pinch off the rubber catheter to avoid urine spillage during the disconnection. °· Disconnect the dirty bag and connect the clean one. °· Empty the dirty bag carefully to avoid a urine spill. °· Attach the new bag to the leg with tape or a  leg strap. °Cleaning the Drainage Bags: °· Whenever a drainage bag is disconnected, it must be cleaned quickly so it is ready for the next use. °· Wash the bag in warm, soapy water. °· Rinse the bag thoroughly with warm water. °· Soak the bag for 30 minutes in a solution of white vinegar and water (1 cup vinegar to 1 quart warm water). °· Rinse with warm water. °SEEK MEDICAL CARE IF:  °· Some pain develops in the kidney (lower back) area. °· The urine is cloudy or smells bad. °· There is some blood in the urine. °· The catheter becomes clogged and/or there is no urine drainage. °SEEK IMMEDIATE MEDICAL CARE IF:  °· You have moderate or severe pain in the kidney region. °· You start to throw up (vomit). °· Blood fills the tube. °· Worsening belly (abdominal) pain develops. °· You have a fever. °MAKE SURE YOU:  °· Understand these instructions. °· Will watch your condition. °· Will get help right away if you are not doing well or get worse. ° °Call [facility] if you have any questions or concerns: [phone] ° ° ° °

## 2017-01-17 NOTE — Anesthesia Postprocedure Evaluation (Signed)
Anesthesia Post Note  Patient: Brian Burns  Procedure(s) Performed: URETHEROPLASTY BUCCAL GRAFT AND BUCCAL MUCOSA GRAFT HARVEST (N/A Urethra) CYSTOSCOPY FLEXIBLE (N/A Urethra)     Patient location during evaluation: PACU Anesthesia Type: General Level of consciousness: awake and alert and oriented Pain management: pain level controlled Vital Signs Assessment: post-procedure vital signs reviewed and stable Respiratory status: spontaneous breathing, nonlabored ventilation, respiratory function stable and aerosol facemask Cardiovascular status: blood pressure returned to baseline and stable Postop Assessment: no apparent nausea or vomiting Anesthetic complications: no    Last Vitals:  Vitals:   01/17/17 1215 01/17/17 1230  BP: 121/88 (!) 129/94  Pulse: 85 83  Resp: 14 13  Temp:    SpO2: 99% 96%    Last Pain:  Vitals:   01/17/17 1215  TempSrc:   PainSc: Asleep                 Abbott Jasinski A.

## 2017-01-17 NOTE — H&P (Signed)
urethral stricture    HPI: Brian Burns is a 70 year-old male established patient who is here for further eval and management of a urethral stricture.  He does have a history of urethral strictures. He has had urethrotomy for the treatment  his urethral stricture . He does have to strain or bear down to start his urinary stream. He does not have a split stream when he urinates. He does not have a good size and strength to his urinary stream. He does have frequency. He is having problems with emptying his bladder well. He has had the symptoms for 3 years.   He has not been on antibiotics for urethral infections previously. He does not have a previous history of sexually transmitted diseases. He has not had a history of trauma to the urethra. He has not received radiation therapy. His symptoms have not gotten worse over the last year.   He has previously had an indwelling catheter in for more than two weeks at a time. He does not catheterize intermittently.   Balloon Dilation in 10/2014 - quick recurrence. Ultimately he settled down - still refused CIC. Last PVR in April 2017 was 127cc.   PSA: 5/17 - 2.56 04/2016- 0.96   Interval: The patient presents today for follow-up after his RUG. His symptoms are stable. He describes a weak stream that is very slow. He does feel as if he empties his bladder, but does also have urinary frequency. He voids at least once an hour. He gets up 3 times at night.     ALLERGIES: Xylocaine SOLN    MEDICATIONS: AmLODIPine Besylate 10 MG Oral Tablet Oral  Aspirin Low Dose 81 MG TABS Oral  Atorvastatin Calcium 40 MG Oral Tablet Oral  Citalopram Hbr 20 mg tablet Oral  Finasteride 5 mg tablet 0 Oral  Gabapentin 100 MG Oral Capsule Oral  Meclizine Hcl 25 mg tablet  Meclizine Hcl 25 mg tablet Oral  Tamsulosin HCl - 0.4 MG Oral Capsule 0 Oral Daily  TraMADol HCl - 50 MG Oral Tablet Oral  Warfarin Sodium 10 mg tablet Oral     GU PSH: Cysto Dilate Stricture (M or F)  - 12/07/2014      PSH Notes: Cystoscopy For Urethral Stricture, No Surgical Problems   NON-GU PSH: None   GU PMH: Urethral Stricture, Unspec, The patient has a history of a soft distal or anterior urethral stricture which we dilated once in the patient subsequently recurred quite quickly. This point, the patient is interested in having this repaired via a urethroplasty given the severity of his symptoms. - 05/13/2016, Urethral stricture, - 05/22/2015 Postinfective bulbous urethral stricture, not elsewhere classified, male - 11/20/2015 Chronic Kidney Disease, Chronic renal failure, unspecified stage - 04/24/2015 Urinary Frequency, Increased urinary frequency - 04/03/2015 Microscopic hematuria, Asymptomatic microscopic hematuria - 01/02/2015 Nocturia, Nocturia - 2015 Other microscopic hematuria, Microscopic hematuria - 2015      PMH Notes:  2012-08-14 14:58:45 - Note: Arthritis   NON-GU PMH: Encounter for general adult medical examination without abnormal findings, Encounter for preventive health examination - 04/24/2015 Cerebral infarction, unspecified, Stroke Syndrome - 2014 Decreased libido, Decreased libido - 2014 Personal history of other diseases of the circulatory system, History of hypertension - 2014 Personal history of other diseases of the digestive system, History of esophageal reflux - 2014 Personal history of other diseases of the nervous system and sense organs, History of glaucoma - 2014 Personal history of other endocrine, nutritional and metabolic disease, History of hypercholesterolemia - 2014  FAMILY HISTORY: Death In The Family Father - Runs In Family Death In The Family Mother - Runs In Family Heart Disease - Mother   SOCIAL HISTORY: Marital Status: Married     Notes: Former smoker, History of tobacco use, Alcohol Use, Caffeine Use, Occupation: Retired, Marital History - Currently Married   REVIEW OF SYSTEMS:    GU Review Male:   Patient reports frequent urination,  hard to postpone urination, burning/ pain with urination, and get up at night to urinate. Patient denies leakage of urine, stream starts and stops, trouble starting your stream, have to strain to urinate , erection problems, and penile pain.  Gastrointestinal (Upper):   Patient denies nausea, vomiting, and indigestion/ heartburn.  Gastrointestinal (Lower):   Patient denies constipation and diarrhea.  Constitutional:   Patient denies fever, night sweats, weight loss, and fatigue.  Skin:   Patient denies skin rash/ lesion and itching.  Eyes:   Patient denies blurred vision and double vision.  Ears/ Nose/ Throat:   Patient denies sore throat and sinus problems.  Hematologic/Lymphatic:   Patient denies swollen glands and easy bruising.  Cardiovascular:   Patient denies leg swelling and chest pains.  Respiratory:   Patient denies cough and shortness of breath.  Endocrine:   Patient denies excessive thirst.  Musculoskeletal:   Patient reports back pain and joint pain.   Neurological:   Patient denies headaches and dizziness.  Psychologic:   Patient denies depression and anxiety.   VITAL SIGNS:      07/11/2016 08:14 AM  BP 121/73 mmHg  Pulse 44 /min   GU PHYSICAL EXAMINATION:    Scrotum: No lesions. No edema. No cysts. No warts.  Urethral Meatus: Normal size. No lesion, no wart, no discharge, no polyp. Normal location.  Penis: Palpable thickening of urethra at penile scrotal junction - otherwise normal   MULTI-SYSTEM PHYSICAL EXAMINATION:    Respiratory: No labored breathing, no use of accessory muscles. CTA-B  Cardiovascular: Normal temperature, normal extremity pulses, no swelling, no varicosities. RRR  Ears, Nose, Mouth, and Throat: No teeth, no oral mucosal lesions     PAST DATA REVIEWED:  Source Of History:  Patient  Records Review:   Previous Patient Records   05/08/16 06/21/15  PSA  Total PSA 0.96 ng/dl 1.612.56     09/60/4507/19/14  Hormones  Testosterone, Total 467     PROCEDURES:          Flexible Cystoscopy - 52000  Risks, benefits, and some of the potential complications of the procedure were discussed at length with the patient including infection, bleeding, voiding discomfort, urinary retention, fever, chills, sepsis, and others. All questions were answered. Informed consent was obtained. Antibiotic prophylaxis was given. Sterile technique and intraurethral analgesia were used.  Meatus:  Normal size. Normal location. Normal condition.  Urethra:  pen hole stricture 10cm into urethra - healthy mucosa distally  Ureteral Orifices:  Normal location. Normal size. Normal shape. Effluxed clear urine.      The lower urinary tract was carefully examined. The procedure was well-tolerated and without complications. Antibiotic instructions were given. Instructions were given to call the office immediately for bloody urine, difficulty urinating, urinary retention, painful or frequent urination, fever, chills, nausea, vomiting or other illness. The patient stated that he understood these instructions and would comply with them.         Urinalysis w/Scope Dipstick Dipstick Cont'd Micro  Color: Yellow Bilirubin: Neg WBC/hpf: >60/hpf  Appearance: Cloudy Ketones: Neg RBC/hpf: 10 - 20/hpf  Specific Gravity:  1.025 Blood: 2+ Bacteria: Rare (0-9/hpf)  pH: 5.5 Protein: 1+ Cystals: NS (Not Seen)  Glucose: Neg Urobilinogen: 0.2 Casts: NS (Not Seen)    Nitrites: Neg Trichomonas: Not Present    Leukocyte Esterase: 3+ Mucous: Not Present      Epithelial Cells: 6 - 10/hpf      Yeast: NS (Not Seen)      Sperm: Not Present    ASSESSMENT:      ICD-10 Details  1 GU:   Anterior urethral stricture - N35.013    PLAN:           Orders Labs Urine Culture          Document Letter(s):  Created for Patient: Clinical Summary         Notes:   The patient has a anterior urethral stricture, 3 cm long, pinhole size as seen on his retrograde urethrogram. He has healthy mucosa anteriorly. This is  measured at approximately 10 cm from the urethral meatus and located about the penile scrotal junction.   We discussed treatment options and the patient is eager to proceed with urethroplasty. Given the length of the stricture of this will need to be a buccal graft urethroplasty. I went over the procedure with the patient in significant detail. He understands that he will have an incision within his mouth as well as an incision along his penis and a Foley catheter for at least 14 days. Patient understands that this will require 24-hour observation. He will also need a medical clearance from his primary care doctor to stop his Coumadin.   The patient also today may have a urinary tract infection, we will plan to treat this for at least 5 days prior to the operation. We will otherwise try to get this scheduled as soon as possible. I will plan to do this with my partner Dr. Alfredo Martinez, M.D.

## 2017-01-17 NOTE — Op Note (Addendum)
Preoperative diagnosis:  1. urethral stricture   Postoperative diagnosis:  1. Same   Procedure: 1. Dorsal onlay Urethroplasty with autologous buccal mucosa graft 2. Buccal graft mucosa  3. Cystourethroscopy  Surgeon: Brian Fat, MD 1st Assistant: Brian Burns, M.D. Resident Surgeon: Brian Pigeon, MD  Anesthesia: General  Complications: None  Intraoperative findings: The patient had a long stricture measuring approximately 4.5cm starting in the anterior urethra and extending into the bulbar segment. The stricture was repaired using approximately 5 cm of buccal mucosa.  EBL: 100  Specimens: None  Indication: Brian Burns is a 71 y.o.  patient with a recurrent dense urethral stricture.  After reviewing the management options for treatment, he elected to proceed with the above surgical procedure(s). We have discussed the potential benefits and risks of the procedure, side effects of the proposed treatment, the likelihood of the patient achieving the goals of the procedure, and any potential problems that might occur during the procedure or recuperation. Informed consent has been obtained.  Description of procedure: An assistant was required for this surgical procedure.  The duties of the assistant included but were not limited to suctioning, tissue retraction, and suturing. This procedure would not be able to be performed without an Geophysicist/field seismologist.  The patient was taken to the operating room and general anesthesia was induced.  The patient was placed in the dorsal lithotomy position, prepped and draped in the usual sterile fashion, and preoperative antibiotics were administered. A preoperative time-out was performed.   Cystourethroscopy was performed using a 17 French rigid cystoscope noting a normal urethra to the bulbar region where there was a dense urethral stricture unable to be passed by the 17 [place] scope. We then passed a 6 [place] open-ended ureteral catheter  through the scope and the stricture and then on into the bladder lumen.  I then passed a 20 French red rubber catheter into the patient's urethra and advanced it to an area where it no longer was easily advanced and measured this as our distal margin. We then made a midline incision between the scrotum and the anus and took this down to the bulbar urethral muscle. A [place] retractor was then placed in the incision edges held open with hooks.  We then split the bulbourethral muscles midline and dissected down onto the urethra. We dissected the urethra clear anteriorly both proximally and distally to the area where the catheter no longer advanced. We then cleaned off the corpora bilaterally. We then dissected out the posterior aspect of the urethra and cut a vessel loop around it. We then mobilized the urethra both proximally and distally to incorporate the entire length stricture. Once the urethra was mobile we made an incision at the tip of the catheter on the dorsal aspect of the urethra. We then opened up the urethra dorsally through the stricture. We then passed a 20 French catheter through the proximal end of the opening to ensure that we could get this by. There was difficulty getting a 20 French catheter, but an 57 French catheter was able to be passed. Performed cystoscopy again noting that the urethra just proximal to the membranous urethra was unhealthy appearing, but no clear stricture. The strictured segment that was open measured approximate 4 cm.  We then turned our attention to the mouth and performed a buccal mucosa harvest. This was performed by first placing a dental pack into the patient's throat tucking the tongue away as well. I then put 2 stay sutures on the mucosa  just adjacent to the lips. These were used to retract the cheek. I then used a ruler and measured 6 cm just inferior to Stensen's duct. We then measured approximately  20 mm wide. I then injected 1% lidocaine with epinephrine  into the measured area and with a 15 blade incised the graft. I then undermined the graft with Metzenbaum scissors ensuring that I was in the fatty layer above the buccinator muscle. Once the graft was free I then reapproximated the edges using a 3-0 chromic suture in a running fashion. Hemostasis was noted to be excellent. We quickly Betadine the area and then packed the cheek so as to provide excellent hemostasis. Then we took the graft to the back table and defatted the graft extensively. We then placed a normal saline solution.   Next we returned back to the perineum and insured that the urethra was adequately dissected out and the stricture opened.  The graft was then placed on the corporal bodies with 3-0 Vicryl in an interrupted fashion at the corners and along the edges. Several quilting sutures were placed as well using 4-0Vicyrl.  I then started at the apex of the urethral incision and with a 3-0 Vicryl suture at the apex to the posterior side of the incision. I then placed a second 4-0 vycryl suture adjacent to that and ran the posterior aspect or edge ensuring that urethral mucosa was sewn to the new buccal graft. Once I got approximately halfway down the posterior line I started on the inferior aspect and worked up tying off the suture in the middle. The anterior incision line was closed in a similar fashion anchoring the apex with a 3-0Vicryl and then with 4-0 Vicryl in a running fashion both superiorly and inferiorly and then meeting in the middle. Prior to completing the anterior aspect of the incision a catheter was placed, 18 [place] Silastic, without any significant difficulty.  I then closed the bulbar urethral muscle and overlying tissue with a 3-0 Vicryl in a running fashion. I then closed the Colles' fascia  And the subcutaneous layer with a 4-0 Vicryl in a running fashion The skin was then reapproximated with a 4-0 Monocryl  Using a running vertical mattress stitch. Dermabond was then  applied. Prior to skin closure corpus and lidocaine mixed with epi was injected into the incision.  The packing was then subsequently removed from the patient's mouth and hemostasis was noted to be excellent. He was subsequently extubated and returned to PACU in stable condition.   Brian FatBenjamin W. Graciano Burns, M.D.

## 2017-01-17 NOTE — Progress Notes (Signed)
Post-op note  Subjective: The patient is doing well.  No complaints.  Objective: Vital signs in last 24 hours: Temp:  [97.4 F (36.3 C)-98.2 F (36.8 C)] 97.4 F (36.3 C) (12/21 1355) Pulse Rate:  [58-105] 64 (12/21 1355) Resp:  [11-18] 14 (12/21 1355) BP: (119-147)/(82-97) 136/82 (12/21 1355) SpO2:  [95 %-100 %] 100 % (12/21 1355) Weight:  [107 kg (236 lb)] 107 kg (236 lb) (12/21 0555)  Intake/Output from previous day: No intake/output data recorded. Intake/Output this shift: Total I/O In: 1600 [I.V.:1600] Out: 650 [Urine:550; Blood:100]  Physical Exam:  General: Alert and oriented. Abdomen: Soft, Nondistended, Foley: clear urine Mouth: Gauze pad in place Incisions: Clean and dry.   Lab Results: Recent Labs    01/16/17 1153  HGB 10.4*  HCT 32.2*    Assessment/Plan: POD#0 s/; urethroplasty w/ buccal graft  Slurred speech given buccal graft  Pain controlled   1) Continue to monitor 2) soft diet  3) IVF  4) Peridex mouth wash  5) PRN oxycodone  6) like dc tomorrow - home with Levaquin x 14 days and prn tramadol ( in chart)    Sydnee Levans, MD, MD   LOS: 0 days   Sydnee Levans, MD 01/17/2017, 2:31 PM

## 2017-01-17 NOTE — Anesthesia Procedure Notes (Signed)
Procedure Name: Intubation Date/Time: 01/17/2017 7:39 AM Performed by: [place] Danya Spearman C, CRNA Pre-anesthesia Checklist: Patient identified, Emergency Drugs available, Suction available and Patient being monitored Patient Re-evaluated:Patient Re-evaluated prior to induction Oxygen Delivery Method: Circle system utilized Preoxygenation: Pre-oxygenation with 100% oxygen Induction Type: IV induction Ventilation: Mask ventilation without difficulty Laryngoscope Size: Mac and 4 Grade View: Grade I Tube type: Oral Tube size: 7.5 mm Number of attempts: 1 Airway Equipment and Method: Stylet and Oral airway Placement Confirmation: ETT inserted through vocal cords under direct vision,  positive ETCO2 and breath sounds checked- equal and bilateral Tube secured with: Tape Dental Injury: Teeth and Oropharynx as per pre-operative assessment

## 2017-01-18 LAB — BASIC METABOLIC PANEL
ANION GAP: 5 (ref 5–15)
BUN: 16 mg/dL (ref 6–20)
CALCIUM: 7.8 mg/dL — AB (ref 8.9–10.3)
CO2: 27 mmol/L (ref 22–32)
CREATININE: 1.41 mg/dL — AB (ref 0.61–1.24)
Chloride: 104 mmol/L (ref 101–111)
GFR calc Af Amer: 57 mL/min — ABNORMAL LOW (ref >60)
GFR, EST NON AFRICAN AMERICAN: 49 mL/min — AB (ref >60)
GLUCOSE: 134 mg/dL — AB (ref 65–99)
Potassium: 3.4 mmol/L — ABNORMAL LOW (ref 3.5–5.1)
Sodium: 136 mmol/L (ref 135–145)

## 2017-01-18 LAB — HEMOGLOBIN AND HEMATOCRIT, BLOOD
HCT: 27.2 % — ABNORMAL LOW (ref 39.0–52.0)
Hemoglobin: 9 g/dL — ABNORMAL LOW (ref 13.0–17.0)

## 2017-01-18 NOTE — Discharge Summary (Signed)
  Date of admission: 01/17/2017  Date of discharge: 01/18/2017  Admission diagnosis: Urethral stricture  Discharge diagnosis: Urethral stricture  History and Physical: For full details, please see admission history and physical. Briefly, Brian Burns is a 71 y.o. year old patient with a urethral stricture.   Hospital Course: He underwent a urethroplasty on 01/17/17 with buccal mucosa graft.  He tolerated the procedure well and was stable for discharge on POD # 1.  Laboratory values:  Recent Labs    01/16/17 1153 01/18/17 0550  HGB 10.4* 9.0*  HCT 32.2* 27.2*   Recent Labs    01/16/17 1153 01/18/17 0550  CREATININE 1.56* 1.41*    Disposition: Home with Foley catheter.  Discharge instruction: The patient was instructed to be ambulatory but told to refrain from heavy lifting, strenuous activity, or driving.  Discharge medications:  Allergies as of 01/18/2017      Reactions   Novocain [procaine Hcl] Nausea And Vomiting   Calcium Carbonate Other (See Comments)   Reaction unknown   Doxycycline Other (See Comments)   Reaction unknown      Medication List    TAKE these medications   amLODipine 10 MG tablet Commonly known as:  NORVASC Take 10 mg by mouth daily.   atorvastatin 40 MG tablet Commonly known as:  LIPITOR take 1 tablet by mouth once daily   CREON 12000 units Cpep capsule Generic drug:  lipase/protease/amylase take 1 capsule by mouth three times a day with meals   ferrous sulfate 325 (65 FE) MG tablet take 1 tablet by mouth three times a day with meals   finasteride 5 MG tablet Commonly known as:  PROSCAR take 1 tablet by mouth once daily   gabapentin 100 MG capsule Commonly known as:  NEURONTIN take 1 capsule by mouth three times a day   levofloxacin 500 MG tablet Commonly known as:  LEVAQUIN Take 1 tablet (500 mg total) by mouth daily for 14 days. Take day of catheter removal   lisinopril 10 MG tablet Commonly known as:   PRINIVIL,ZESTRIL Take 10 mg by mouth daily.   pantoprazole 40 MG tablet Commonly known as:  PROTONIX Take 1 tablet (40 mg total) by mouth daily. What changed:  when to take this   tamsulosin 0.4 MG Caps capsule Commonly known as:  FLOMAX take 1 capsule by mouth once daily   traMADol 50 MG tablet Commonly known as:  ULTRAM Take 1 tablet (50 mg total) by mouth every 6 (six) hours as needed.   warfarin 10 MG tablet Commonly known as:  COUMADIN Take as directed. If you are unsure how to take this medication, talk to your nurse or doctor. Original instructions:  take 1 tablet by mouth once daily      Rx for Peridex (0.12%) also sent in for his buccal incision.  Followup:  Follow-up Information    Crist Fat, MD In 2 weeks.   Specialty:  Urology Why:  Catheter removal and wound check Contact information: [address] [place] [address] [phone]

## 2017-01-18 NOTE — Progress Notes (Signed)
Patient ID: Brian Burns, male   DOB: 1945/12/16, 71 y.o.   MRN: [id number]  1 Day Post-Op Subjective: Pt doing well.  No complaints.    Objective: Vital signs in last 24 hours: Temp:  [97.4 F (36.3 C)-99.7 F (37.6 C)] 99.1 F (37.3 C) (12/22 0648) Pulse Rate:  [54-105] 54 (12/22 0648) Resp:  [11-18] 18 (12/22 0648) BP: (113-136)/(70-97) 132/70 (12/22 0648) SpO2:  [95 %-100 %] 96 % (12/22 0648)  Intake/Output from previous day: 12/21 0701 - 12/22 0700 In: 2777.5 [I.V.:2777.5] Out: 1050 [Urine:950; Blood:100] Intake/Output this shift: No intake/output data recorded.  Physical Exam:  General: Alert and oriented CV: RRR Lungs: Clear Abdomen: Soft, ND Dressing intact and dry GU: Urine clear.  Lab Results: Recent Labs    01/16/17 1153 01/18/17 0550  HGB 10.4* 9.0*  HCT 32.2* 27.2*   BMET Recent Labs    01/16/17 1153 01/18/17 0550  NA 137 136  K 3.9 3.4*  CL 107 104  CO2 25 27  GLUCOSE 111* 134*  BUN 22* 16  CREATININE 1.56* 1.41*  CALCIUM 8.4* 7.8*     Studies/Results: No results found.  Assessment/Plan: POD # 1 s/p urethroplasty - D/C home with catheter   LOS: 1 day   Brian Burns,Brian Burns 01/18/2017, 10:45 AM

## 2017-01-24 ENCOUNTER — Ambulatory Visit (INDEPENDENT_AMBULATORY_CARE_PROVIDER_SITE_OTHER): Payer: Medicare Other | Admitting: *Deleted

## 2017-01-24 DIAGNOSIS — Z7901 Long term (current) use of anticoagulants: Secondary | ICD-10-CM | POA: Diagnosis not present

## 2017-01-24 LAB — POCT INR: INR: 1.6

## 2017-01-24 NOTE — Progress Notes (Signed)
Patient has been off of coumadin since 01/11/2017 for a surgical procedure. Per his urologist, he is ok to start taking his coumadin today as long as he is not having any visible blood in his urine. Per [name], he does not have any blood in his urine so will restart previous dose of 10 mg daily and recheck in 1 week. Busick, Rodena Medin

## 2017-01-31 ENCOUNTER — Ambulatory Visit (INDEPENDENT_AMBULATORY_CARE_PROVIDER_SITE_OTHER): Payer: Medicare Other | Admitting: *Deleted

## 2017-01-31 DIAGNOSIS — N35013 Post-traumatic anterior urethral stricture: Secondary | ICD-10-CM | POA: Diagnosis not present

## 2017-01-31 DIAGNOSIS — Z7901 Long term (current) use of anticoagulants: Secondary | ICD-10-CM

## 2017-01-31 LAB — POCT INR: INR: 1.7

## 2017-02-07 ENCOUNTER — Ambulatory Visit (INDEPENDENT_AMBULATORY_CARE_PROVIDER_SITE_OTHER): Payer: Medicare Other | Admitting: *Deleted

## 2017-02-07 DIAGNOSIS — Z7901 Long term (current) use of anticoagulants: Secondary | ICD-10-CM

## 2017-02-07 LAB — POCT INR: INR: 2.4

## 2017-02-10 ENCOUNTER — Other Ambulatory Visit: Payer: Self-pay | Admitting: *Deleted

## 2017-02-10 MED ORDER — AMLODIPINE BESYLATE 10 MG PO TABS: 10.0000 mg | ORAL_TABLET | Freq: Every day | ORAL | 3 refills | Status: DC

## 2017-03-03 ENCOUNTER — Ambulatory Visit (INDEPENDENT_AMBULATORY_CARE_PROVIDER_SITE_OTHER): Payer: Medicare Other | Admitting: *Deleted

## 2017-03-03 DIAGNOSIS — Z7901 Long term (current) use of anticoagulants: Secondary | ICD-10-CM

## 2017-03-03 LAB — POCT INR: INR: 1.2

## 2017-03-06 NOTE — Progress Notes (Signed)
   Subjective   Patient ID: Brian Burns    DOB: October 15, 1945, 72 y.o. male   MRN: [id number]  CC: "Right knee pain"  HPI: Brian Burns is a 72 y.o. male who presents to clinic today for the following:  Right knee pain: Began 3 days ago with sudden onset.  No history of trauma, fall, or popping sensation.  Patient denies history of right knee pain or similar joint pain.  Patient denies fevers or chills, nausea or vomiting, edema, shortness of breath, unstable gait.  He uses a cane to ambulate.  Pain is significantly improved with Tylenol use.  Pain also relieved with complete extension of the knee and rest.  Pain exacerbated with ambulation and knee flexion.  Chronic low back pain: Problem for many years.  Localized to lower back with bilateral distribution.  Patient denies motor weakness, radicular pain, neuropathy, loss of bowel or bladder control, saddle anesthesia.  He has tried Tylenol and NSAIDs without relief.  He is currently on gabapentin 100 mg at improvement.  There is no history of trauma.  ROS: see HPI for pertinent.  PMFSH: T2DM, PAF, PVD, AAA, CAD, CKD, BPH with history of urethral strictures, achalasia, GERD.  Surgical history multiple EGD, multiple cystoscopy, cataract, urethroplasty.  Family history heart disease, CKD, lung disease.  Smoking status reviewed. Medications reviewed.  Objective   BP 94/68   Pulse 60   Temp (!) 97.5 F (36.4 C) (Oral)   Ht 6\' 4"  (1.93 m)   Wt 229 lb 12.8 oz (104.2 kg)   SpO2 97%   BMI 27.97 kg/m  Vitals and nursing note reviewed.  General: well nourished, well developed, NAD with non-toxic appearance HEENT: normocephalic, atraumatic, moist mucous membranes Cardiovascular: regular rate and rhythm without murmurs, rubs, or gallops Lungs: clear to auscultation bilaterally with normal work of breathing Skin: warm, dry, no rashes or lesions, cap refill < 2 seconds Extremities: warm and well perfused, normal tone, no edema MSK: 5/5 motor  strength in all 4 extremities, no loss of sensation, 2+ pedal pulse bilaterally, no increased edema at joint or calf, knee without calor, active range of motion intact, no crepitus with flexion or extension of the right knee, no ligamental damage based on exam, paraspinal tenderness approximately L3-L4 with minimal severity without exacerbation with flexion or extension of back  Assessment & Plan   Acute pain of right knee Acute. Appears to be related to light trauma. No signs of ligament damage, septic joint, hematoma, or fracture/subluxation. Improved with Tylenol use. - Instructed to continue Tylenol and RICE and follow up if not improved in 6-8 weeks   Chronic low back pain without sciatica Chronic. No red flags. Likely some component of arthritic changes given chronicity. Does not improve with conservative pain management.  - Patient given options and decided proceed with increase in gabapentin 100 mg three times daily with instrictions to increase by 100 mg weekly if needed - RTC in 1 month for next PCP appt and re-evaluation - Would consider switching from Celexa to Cymbalta, or Tramadol if back pain continues to limit patient   No orders of the defined types were placed in this encounter.  No orders of the defined types were placed in this encounter.   Durward Parcel, DO [facility], PGY-2 03/10/2017, 2:08 PM

## 2017-03-07 ENCOUNTER — Ambulatory Visit (INDEPENDENT_AMBULATORY_CARE_PROVIDER_SITE_OTHER): Payer: Medicare Other | Admitting: Family Medicine

## 2017-03-07 ENCOUNTER — Other Ambulatory Visit: Payer: Self-pay

## 2017-03-07 VITALS — BP 94/68 | HR 60 | Temp 97.5°F | Ht 76.0 in | Wt 229.8 lb

## 2017-03-07 DIAGNOSIS — G8929 Other chronic pain: Secondary | ICD-10-CM | POA: Diagnosis not present

## 2017-03-07 DIAGNOSIS — M25561 Pain in right knee: Secondary | ICD-10-CM | POA: Diagnosis not present

## 2017-03-07 DIAGNOSIS — M545 Low back pain, unspecified: Secondary | ICD-10-CM | POA: Insufficient documentation

## 2017-03-07 NOTE — Assessment & Plan Note (Addendum)
Acute. Appears to be related to light trauma. No signs of ligament damage, septic joint, hematoma, or fracture/subluxation. Improved with Tylenol use. - Instructed to continue Tylenol and RICE and follow up if not improved in 6-8 weeks

## 2017-03-07 NOTE — Patient Instructions (Signed)
Thank you for coming in to see [place] today. Please see below to review our plan for today's visit.  1.  Continue taking Tylenol every 6 hours as needed for your knee pain.  We will follow-up during her next visit. 2.  We can try increasing your gabapentin for your back pain.  Began with taking additional 100 mg tablet in the afternoon and the evening.  This medication can cause her to be drowsy so be sure to not to drive or operate heavy machinery.  You can increase as high as 300 mg 3 times daily.  We will reassess during your next visit and consider other options if there is no improvement.  Please call the clinic at [phone] if your symptoms worsen or you have any concerns. It was our pleasure to serve you.  Durward Parcel, DO [facility], PGY-2

## 2017-03-07 NOTE — Assessment & Plan Note (Addendum)
Chronic. No red flags. Likely some component of arthritic changes given chronicity. Does not improve with conservative pain management.  - Patient given options and decided proceed with increase in gabapentin 100 mg three times daily with instrictions to increase by 100 mg weekly if needed - RTC in 1 month for next PCP appt and re-evaluation - Would consider switching from Celexa to Cymbalta, or Tramadol if back pain continues to limit patient

## 2017-03-10 ENCOUNTER — Encounter: Payer: Self-pay | Admitting: Family Medicine

## 2017-03-13 ENCOUNTER — Other Ambulatory Visit: Payer: Self-pay

## 2017-03-13 MED ORDER — TAMSULOSIN HCL 0.4 MG PO CAPS: 0.4000 mg | ORAL_CAPSULE | Freq: Every day | ORAL | 3 refills | Status: DC

## 2017-03-17 ENCOUNTER — Ambulatory Visit (INDEPENDENT_AMBULATORY_CARE_PROVIDER_SITE_OTHER): Payer: Medicare Other | Admitting: *Deleted

## 2017-03-17 DIAGNOSIS — Z7901 Long term (current) use of anticoagulants: Secondary | ICD-10-CM | POA: Diagnosis not present

## 2017-03-17 LAB — POCT INR: INR: 2.7

## 2017-03-24 ENCOUNTER — Other Ambulatory Visit: Payer: Self-pay | Admitting: *Deleted

## 2017-03-24 MED ORDER — TAMSULOSIN HCL 0.4 MG PO CAPS: 0.4000 mg | ORAL_CAPSULE | Freq: Every day | ORAL | 3 refills | Status: DC

## 2017-03-24 MED ORDER — CITALOPRAM HYDROBROMIDE 20 MG PO TABS: 20.0000 mg | ORAL_TABLET | Freq: Every day | ORAL | 0 refills | Status: DC

## 2017-03-26 ENCOUNTER — Other Ambulatory Visit: Payer: Self-pay | Admitting: Internal Medicine

## 2017-03-26 NOTE — Telephone Encounter (Signed)
REFILL 

## 2017-04-03 ENCOUNTER — Ambulatory Visit (INDEPENDENT_AMBULATORY_CARE_PROVIDER_SITE_OTHER): Payer: Medicare Other | Admitting: Family Medicine

## 2017-04-03 ENCOUNTER — Other Ambulatory Visit: Payer: Self-pay

## 2017-04-03 ENCOUNTER — Encounter: Payer: Self-pay | Admitting: Family Medicine

## 2017-04-03 VITALS — BP 110/70 | HR 65 | Temp 98.2°F | Ht 76.0 in | Wt 231.0 lb

## 2017-04-03 DIAGNOSIS — E119 Type 2 diabetes mellitus without complications: Secondary | ICD-10-CM

## 2017-04-03 DIAGNOSIS — M545 Low back pain, unspecified: Secondary | ICD-10-CM

## 2017-04-03 DIAGNOSIS — I480 Paroxysmal atrial fibrillation: Secondary | ICD-10-CM

## 2017-04-03 DIAGNOSIS — G8929 Other chronic pain: Secondary | ICD-10-CM | POA: Diagnosis not present

## 2017-04-03 LAB — POCT GLYCOSYLATED HEMOGLOBIN (HGB A1C): HEMOGLOBIN A1C: 6.5

## 2017-04-03 LAB — POCT INR: INR: 3.5

## 2017-04-03 MED ORDER — GABAPENTIN 100 MG PO CAPS: 200.0000 mg | ORAL_CAPSULE | Freq: Three times a day (TID) | ORAL | 2 refills | Status: DC

## 2017-04-03 NOTE — Progress Notes (Addendum)
   Subjective:   Patient ID: Brian Burns    DOB: 18-Nov-1945, 72 y.o. male   MRN: [id number]  CC: Follow-up, routine physical   HPI: Brian Burns is a 72 y.o. male who presents to clinic today for routine physical and follow-up.  Paroxysmal atrial fibrillation Patient is on warfarin with good compliance.  He states he has been off his Coumadin from 01/11/2017 for a surgical procedure.  He restarted Coumadin on 12/28.  Denies signs of bleeding.  Taking 10 mg daily.  Denies chest pain, shortness of breath, palpitations, leg swelling.  Type 2 DM Patient not on medication and denies symptoms of polyuria or hypoglycemia.  No recent changes in weight or diet.  He is due to recheck A1c.  ROS: No fever, chills, nausea, vomiting.  No chest pain, shortness of breath, palpitations, leg swelling.  Social: Patient is a former smoker, quit 2001  Medications reviewed. Objective:   BP 110/70   Pulse 65   Temp 98.2 F (36.8 C) (Oral)   Ht 6\' 4"  (1.93 m)   Wt 231 lb (104.8 kg)   SpO2 95%   BMI 28.12 kg/m  Vitals and nursing note reviewed.  General: 72 year old African-American male, NAD, well-appearing HEENT: NCAT, EOMI, MMM, oropharynx is clear Neck: Supple, normal range of motion CV: RRR no MRG Lungs: CTA B, normal effort, no crackles or wheeze Abdomen: Soft, NT ND, masses or organomegaly, positive bowel sounds Skin: warm, dry, no rash, brisk cap refill Extremities: warm and well perfused, normal tone Neuro: Alert, oriented x3, no focal deficits  Assessment & Plan:   Type 2 diabetes mellitus without complication, without long-term current use of insulin (HCC) A1c 6.5, diet controlled -Recheck in 6 months  Paroxysmal atrial fibrillation (HCC) INR noted to be 3.5.  Discussed with Tessie Fass -  new plan reviewed with pt and instructions for anticoagulation dose given with calendar.  Orders Placed This Encounter  Procedures  . POCT glycosylated hemoglobin (Hb A1C)  . POCT  INR    This back office order was created through the Results Console.   Meds ordered this encounter  Medications  . gabapentin (NEURONTIN) 100 MG capsule    Sig: Take 2 capsules (200 mg total) by mouth 3 (three) times daily.    Dispense:  180 capsule    Refill:  2    Freddrick March, MD [facility], PGY-2 04/07/2017 12:59 PM

## 2017-04-03 NOTE — Patient Instructions (Signed)
You were seen in clinic today for a routine physical and everything looks great.  For your Coumadin, Molly Maduro has given you a calendar with your new medication dosage that I would like for you to follow.  Additionally, we discussed your back and knee pain.  At your last visit, Dr. Abelardo Diesel had recommended increasing the dose of gabapentin.  I would advise increasing to 200 mg 3 times a day starting today and seeing how you do on this.  If you develop any new or worsening symptoms, please make an appointment to be seen by provider.  You may call clinic if you have any questions.   Be well, Freddrick March MD

## 2017-04-07 NOTE — Assessment & Plan Note (Addendum)
A1c 6.5, diet controlled -Recheck in 6 months

## 2017-04-07 NOTE — Assessment & Plan Note (Signed)
INR noted to be 3.5.  Discussed with Tessie Fass -  new plan reviewed with pt and instructions for anticoagulation dose given with calendar.

## 2017-04-14 ENCOUNTER — Ambulatory Visit (INDEPENDENT_AMBULATORY_CARE_PROVIDER_SITE_OTHER): Payer: Medicare Other | Admitting: *Deleted

## 2017-04-14 DIAGNOSIS — Z7901 Long term (current) use of anticoagulants: Secondary | ICD-10-CM | POA: Diagnosis not present

## 2017-04-14 LAB — POCT INR: INR: 1.8

## 2017-04-15 ENCOUNTER — Encounter: Payer: Self-pay | Admitting: Podiatry

## 2017-04-15 ENCOUNTER — Ambulatory Visit (INDEPENDENT_AMBULATORY_CARE_PROVIDER_SITE_OTHER): Payer: Medicare Other | Admitting: Podiatry

## 2017-04-15 DIAGNOSIS — D689 Coagulation defect, unspecified: Secondary | ICD-10-CM

## 2017-04-15 DIAGNOSIS — M79675 Pain in left toe(s): Secondary | ICD-10-CM

## 2017-04-15 DIAGNOSIS — M79674 Pain in right toe(s): Secondary | ICD-10-CM | POA: Diagnosis not present

## 2017-04-15 DIAGNOSIS — E1159 Type 2 diabetes mellitus with other circulatory complications: Secondary | ICD-10-CM | POA: Diagnosis not present

## 2017-04-15 DIAGNOSIS — B351 Tinea unguium: Secondary | ICD-10-CM | POA: Diagnosis not present

## 2017-04-15 NOTE — Progress Notes (Signed)
Patient ID: Brian Burns, male   DOB: 04/06/1945, 72 y.o.   MRN: [id number] Complaint:  Visit Type: Patient returns to my office for continued preventative foot care services. Complaint: Patient states" my nails have grown long and thick and become painful to walk and wear shoes" Patient has been diagnosed with borderline diabetic with PVD and peripheral neuropathy.. He presents for preventative foot care services. No changes to ROS.  Patient is taking coumadin. Podiatric Exam: Vascular: dorsalis pedis and posterior tibial pulses are palpable bilateral. Capillary return is immediate. Temperature gradient is WNL. Skin turgor WNL  Sensorium: Normal Semmes Weinstein monofilament test. Normal tactile sensation bilaterally. Nail Exam: Pt has thick disfigured discolored nails with subungual debris noted bilateral entire nail hallux through fifth toenails Ulcer Exam: There is no evidence of ulcer or pre-ulcerative changes or infection. Orthopedic Exam: Muscle tone and strength are WNL. No limitations in general ROM. No crepitus or effusions noted. Foot type and digits show no abnormalities. Bony prominences are unremarkable. Skin: No Porokeratosis. No infection or ulcers  Diagnosis:  Tinea unguium, Pain in right toe, pain in left toes  Treatment & Plan Procedures and Treatment: Consent by patient was obtained for treatment procedures. The patient understood the discussion of treatment and procedures well. All questions were answered thoroughly reviewed. Debridement of mycotic and hypertrophic toenails, 1 through 5 bilateral and clearing of subungual debris. No ulceration, no infection noted.  Return Visit-Office Procedure: Patient instructed to return to the office for a follow up visit 3 months for continued evaluation and treatment.  Teagen Mcleary DPM 

## 2017-04-21 ENCOUNTER — Other Ambulatory Visit (INDEPENDENT_AMBULATORY_CARE_PROVIDER_SITE_OTHER): Payer: Medicare Other | Admitting: *Deleted

## 2017-04-21 ENCOUNTER — Other Ambulatory Visit: Payer: Self-pay

## 2017-04-21 DIAGNOSIS — Z7901 Long term (current) use of anticoagulants: Secondary | ICD-10-CM | POA: Diagnosis not present

## 2017-04-21 LAB — POCT INR: INR: 2.1

## 2017-04-21 MED ORDER — WARFARIN SODIUM 10 MG PO TABS: 10.0000 mg | ORAL_TABLET | Freq: Every day | ORAL | 0 refills | Status: DC

## 2017-04-23 DIAGNOSIS — H40013 Open angle with borderline findings, low risk, bilateral: Secondary | ICD-10-CM | POA: Diagnosis not present

## 2017-04-23 DIAGNOSIS — H26491 Other secondary cataract, right eye: Secondary | ICD-10-CM | POA: Diagnosis not present

## 2017-04-23 DIAGNOSIS — Z961 Presence of intraocular lens: Secondary | ICD-10-CM | POA: Diagnosis not present

## 2017-04-23 DIAGNOSIS — H35033 Hypertensive retinopathy, bilateral: Secondary | ICD-10-CM | POA: Diagnosis not present

## 2017-05-02 ENCOUNTER — Other Ambulatory Visit: Payer: Self-pay

## 2017-05-02 MED ORDER — CITALOPRAM HYDROBROMIDE 20 MG PO TABS: 20.0000 mg | ORAL_TABLET | Freq: Every day | ORAL | 0 refills | Status: DC

## 2017-05-05 ENCOUNTER — Other Ambulatory Visit: Payer: Self-pay

## 2017-05-05 MED ORDER — CITALOPRAM HYDROBROMIDE 20 MG PO TABS: 20.0000 mg | ORAL_TABLET | Freq: Every day | ORAL | 2 refills | Status: DC

## 2017-05-05 NOTE — Telephone Encounter (Signed)
Pt request a 90 day supply. Ples Specter, RN [facility] RN)

## 2017-05-11 ENCOUNTER — Other Ambulatory Visit: Payer: Self-pay | Admitting: Family Medicine

## 2017-05-12 ENCOUNTER — Ambulatory Visit (INDEPENDENT_AMBULATORY_CARE_PROVIDER_SITE_OTHER): Payer: Medicare Other | Admitting: *Deleted

## 2017-05-12 ENCOUNTER — Other Ambulatory Visit: Payer: Self-pay

## 2017-05-12 DIAGNOSIS — Z7901 Long term (current) use of anticoagulants: Secondary | ICD-10-CM | POA: Diagnosis not present

## 2017-05-12 LAB — POCT INR: INR: 2.5

## 2017-05-12 NOTE — Telephone Encounter (Signed)
Will forward to PCP  Bacigalupo, Marzella Schlein, MD, MPH [facility] 05/12/2017 8:21 AM

## 2017-05-13 MED ORDER — FINASTERIDE 5 MG PO TABS: 5.0000 mg | ORAL_TABLET | Freq: Every day | ORAL | 2 refills | Status: DC

## 2017-05-22 ENCOUNTER — Other Ambulatory Visit: Payer: Self-pay

## 2017-05-22 DIAGNOSIS — G8929 Other chronic pain: Secondary | ICD-10-CM

## 2017-05-22 DIAGNOSIS — M545 Low back pain, unspecified: Secondary | ICD-10-CM

## 2017-05-22 MED ORDER — GABAPENTIN 100 MG PO CAPS: 200.0000 mg | ORAL_CAPSULE | Freq: Three times a day (TID) | ORAL | 2 refills | Status: DC

## 2017-06-05 ENCOUNTER — Ambulatory Visit (INDEPENDENT_AMBULATORY_CARE_PROVIDER_SITE_OTHER): Payer: Medicare Other | Admitting: Internal Medicine

## 2017-06-05 ENCOUNTER — Encounter: Payer: Self-pay | Admitting: Internal Medicine

## 2017-06-05 VITALS — BP 120/74 | HR 46 | Ht 76.0 in | Wt 238.6 lb

## 2017-06-05 DIAGNOSIS — I2510 Atherosclerotic heart disease of native coronary artery without angina pectoris: Secondary | ICD-10-CM

## 2017-06-05 DIAGNOSIS — I10 Essential (primary) hypertension: Secondary | ICD-10-CM

## 2017-06-05 DIAGNOSIS — I428 Other cardiomyopathies: Secondary | ICD-10-CM

## 2017-06-05 DIAGNOSIS — I480 Paroxysmal atrial fibrillation: Secondary | ICD-10-CM

## 2017-06-05 NOTE — Patient Instructions (Signed)
Your physician wants you to follow-up in: 12 months with Dr. Hilty. You will receive a reminder letter in the mail two months in advance. If you don't receive a letter, please call our office to schedule the follow-up appointment.  

## 2017-06-06 ENCOUNTER — Encounter: Payer: Self-pay | Admitting: Internal Medicine

## 2017-06-06 NOTE — Progress Notes (Signed)
OFFICE CONSULT NOTE  Chief Complaint:  No complaints  Primary Care Physician: Freddrick March, MD  HPI:  Brian Burns is a 72 y.o. male who is being seen today for the evaluation of preoperative cardiac risk at the request of Freddrick March, MD. [name] has as complex cardiac history and has previously been followed by [name]. He was last seen in hospital consultation in 2016 by Dr. Mayford Knife for new onset a-fib. His past medical history is significant for CVA, HTN, HLD, ED, BPH, CAD, PVD, AAA, DM2 and urethral stricture with recurrent UTI. In 2016 he had catheter-related UTI Which was associated with a-fib. Echo was performed which showed low normal LVEF of 50-55%. CHADSVASC score of 6. He was warfarin and remains on it. He has not had an ischemia evaluation in many years. His wife, who was with him today in the office, noted that he has been short of breath. He also reports some chest tightness with exertion that is relieved by rest. He is supposed to undergo a procedure to treat his urethral stricture.  09/24/2016  [name] returns today for follow-up of his cardiovascular studies. He underwent a [employer] which is considered low risk without ischemia however LVEF was 46%. Findings were consistent with nonischemic cardio myopathy. We discussed the findings today in follow-up and I felt that he would be at low risk for upcoming urethral surgery. He has a history of low normal EF of 50-55% in the past associated with A. fib. He has been short of breath however and there may be a component of congestive heart failure. I'm recommending additional lab work and will repeat an echocardiogram to see if his LVEF is truly reduced. We may need to add a diuretic however carefully given his history of urethral stricture.  11/06/2016  [name] was seen today in follow-up. He underwent a Lexiscan Myoview which showed a reduced LVEF of 46% but no ischemia. Subsequently he had an echo which  showed a normal LVEF of 50-55%. Blood pressure is now better controlled. I've adjusted his medications however the ACE inhibitor cause an increase in creatinine. I discontinued that went back to his amlodipine and he seems to be doing well on that. From a cardiac standpoint he should be cleared for surgery.  06/05/2017  [name] was seen today in follow-up.  He is asymptomatic and denies chest pain or worsening shortness of breath.  His LVEF had normalized on last echo.  Blood pressures well controlled today 120/74.  He reports compliance with his medications.  EKG today shows sinus bradycardia at 46, but he denies any fatigue or symptoms related to his bradycardia.  He is not on any AV nodal blocking agents.  INR is been stable on warfarin.  PMHx:  Past Medical History:  Diagnosis Date  . AAA (abdominal aortic aneurysm) without rupture (HCC) 06/2014   no change in last exam from 2015  . Arthritis    knees,lower back  . Atrial fibrillation (HCC)   . Atrial flutter with rapid ventricular response (HCC) 01/16/2015  . CAD (coronary artery disease)    pt denies  . Carotid artery disease (HCC)   . Chronic back pain   . Dyspnea   . Erectile dysfunction   . GERD (gastroesophageal reflux disease)   . Headache    hx of  . History of CVA (cerebrovascular accident)    2009  &  2013  . HLD (hyperlipidemia)   . Hypertension   .  Idiopathic pancreatitis 12/2014   acute  . Pre-diabetes    per pt. no meds  . PVD (peripheral vascular disease) with claudication [facility])     Past Surgical History:  Procedure Laterality Date  . back injections     [facility]  . CARDIOVASCULAR STRESS TEST  08-09-2009   mild global hypokinesis,  no ischemia or scar/  ef 41%  . CATARACT EXTRACTION W/ INTRAOCULAR LENS  IMPLANT, BILATERAL  april and may 2014  . CYSTOSCOPY N/A 01/17/2017   Procedure: CYSTOSCOPY FLEXIBLE;  Surgeon: Crist Fat, MD;  Location: [facility];  Service: Urology;  Laterality:  N/A;  . CYSTOSCOPY WITH RETROGRADE URETHROGRAM N/A 11/24/2014   Procedure: CYSTOSCOPY WITH RETROGRADE URETHROGRAM;  Surgeon: Crist Fat, MD;  Location: [facility];  Service: Urology;  Laterality: N/A;  . CYSTOSCOPY WITH URETHRAL DILATATION N/A 11/24/2014   Procedure: CYSTOSCOPY WITH URETHRAL DILATATION;  Surgeon: Crist Fat, MD;  [facility];  Service: Urology;  Laterality: N/A;  BALLOON DILATION        . ESOPHAGOGASTRODUODENOSCOPY N/A 05/15/2014   Procedure: ESOPHAGOGASTRODUODENOSCOPY (EGD);  Surgeon: Carman Ching, MD;  Location: [facility];  Service: Endoscopy;  Laterality: N/A;  . ESOPHAGOGASTRODUODENOSCOPY Left 02/02/2015   Procedure: ESOPHAGOGASTRODUODENOSCOPY (EGD);  Surgeon: Dorena Cookey, MD;  Location: [facility];  Service: Endoscopy;  Laterality: Left;  . ESOPHAGOGASTRODUODENOSCOPY Left 03/16/2015   Procedure: ESOPHAGOGASTRODUODENOSCOPY (EGD);  Surgeon: Dorena Cookey, MD;  Location: [facility];  Service: Endoscopy;  Laterality: Left;  . TRANSTHORACIC ECHOCARDIOGRAM  01-07-2012   mild to moderate dilated LV,  ef 45-50%,  mild basal hypokinesis,  grade I diastolic  dysfunction/  trivial AR/  mild MR  . URETHROPLASTY N/A 01/17/2017   Procedure: URETHEROPLASTY BUCCAL GRAFT AND BUCCAL MUCOSA GRAFT HARVEST;  Surgeon: Crist Fat, MD;  Location: [facility];  Service: Urology;  Laterality: N/A;    FAMHx:  Family History  Problem Relation Age of Onset  . Lung disease Brother   . Kidney disease Brother   . Heart disease Brother        died at 50  . Heart disease Mother        died at 25  . Kidney disease Mother   . Lung disease Mother     SOCHx:   reports that he quit smoking about 18 years ago. His smoking use included cigarettes. He has a 20.00 pack-year smoking history. He has never used smokeless tobacco. He reports that he does not drink alcohol or use drugs.  ALLERGIES:  Allergies  Allergen Reactions  . Novocain  [Procaine Hcl] Nausea And Vomiting  . Doxycycline Other (See Comments)    Reaction unknown    ROS: Pertinent items noted in HPI and remainder of comprehensive ROS otherwise negative.  HOME MEDS: Current Outpatient Medications on File Prior to Visit  Medication Sig Dispense Refill  . amLODipine (NORVASC) 10 MG tablet Take 1 tablet (10 mg total) by mouth daily. 90 tablet 3  . atorvastatin (LIPITOR) 40 MG tablet TAKE 1 TABLET BY MOUTH ONCE DAILY 90 tablet 0  . citalopram (CELEXA) 20 MG tablet Take 1 tablet (20 mg total) by mouth daily. 90 tablet 2  . CREON 12000 units CPEP capsule take 1 capsule by mouth three times a day with meals 270 capsule 3  . ferrous sulfate 325 (65 FE) MG tablet take 1 tablet by mouth three times a day with meals 90 tablet 3  . finasteride (PROSCAR) 5 MG tablet Take 1  tablet (5 mg total) by mouth daily. 90 tablet 2  . gabapentin (NEURONTIN) 100 MG capsule Take 2 capsules (200 mg total) by mouth 3 (three) times daily. 180 capsule 2  . lisinopril (PRINIVIL,ZESTRIL) 10 MG tablet TAKE 1 TABLET BY MOUTH ONCE DAILY 90 tablet 1  . pantoprazole (PROTONIX) 40 MG tablet Take 1 tablet (40 mg total) by mouth daily. 30 tablet 0  . tamsulosin (FLOMAX) 0.4 MG CAPS capsule Take 1 capsule (0.4 mg total) by mouth daily. 30 capsule 3  . warfarin (COUMADIN) 10 MG tablet TAKE 1 TABLET(10 MG) BY MOUTH DAILY 90 tablet 0   No current facility-administered medications on file prior to visit.     LABS/IMAGING: No results found for this or any previous visit (from the past 48 hour(s)). No results found.  LIPID PANEL:    Component Value Date/Time   CHOL 167 07/20/2015 1502   TRIG 129 07/20/2015 1502   HDL 51 07/20/2015 1502   CHOLHDL 3.3 07/20/2015 1502   VLDL 26 07/20/2015 1502   LDLCALC 90 07/20/2015 1502   LDLDIRECT 175 (H) 04/09/2011 1122    WEIGHTS: Wt Readings from Last 3 Encounters:  06/05/17 238 lb 9.6 oz (108.2 kg)  04/03/17 231 lb (104.8 kg)  03/07/17 229 lb 12.8 oz  (104.2 kg)    VITALS: BP 120/74   Pulse (!) 46   Ht 6\' 4"  (1.93 m)   Wt 238 lb 9.6 oz (108.2 kg)   BMI 29.04 kg/m   EXAM: General appearance: alert and no distress Neck: no carotid bruit, no JVD and thyroid not enlarged, symmetric, no tenderness/mass/nodules Lungs: clear to auscultation bilaterally Heart: regular rate and rhythm, S1, S2 normal, no murmur, click, rub or gallop Abdomen: soft, non-tender; bowel sounds normal; no masses,  no organomegaly Extremities: extremities normal, atraumatic, no cyanosis or edema Pulses: 2+ and symmetric Skin: Skin color, texture, turgor normal. No rashes or lesions Neurologic: Grossly normal Psych: Pleasant  EKG: Sinus Bradycardia 46-personally reviewed  ASSESSMENT: 1. Low preoperative risk - low risk myoview, with EF 46%, 50-55% by echo 2. Exertional chest pressure - resolved 3. Dyslipidemia 4. PAD- AAA (followed by vascular surgery) 5. PAF on warfarin (CHADSVASC score of 6) 6. Asymptomatic bradycardia  PLAN: 1.   Mr. Grobe denies any chest pain or worsening shortness of breath.  His LVEF has near normalized by echo.  His INR is been stable on warfarin for which he takes for his PAF.  He is asymptomatic bradycardia today.  He is not on AV nodal blockers.  Heart rate should be monitored.  Cholesterol is due to be rechecked.  Otherwise follow-up with me annually or sooner as necessary.Chrystie Nose, MD, [facility], FACP  [place]  [facility]  Medical Director of the Advanced Lipid Disorders &  Cardiovascular Risk Reduction Clinic Diplomate of the American Board of Clinical Lipidology Attending Cardiologist  Direct Dial: [phone]  Fax: [phone]  Website:  [email or web address] Nicely Marchelle Rinella 06/06/2017, 9:54 AM

## 2017-06-10 ENCOUNTER — Other Ambulatory Visit: Payer: Self-pay

## 2017-06-10 MED ORDER — CITALOPRAM HYDROBROMIDE 20 MG PO TABS: 20.0000 mg | ORAL_TABLET | Freq: Every day | ORAL | 2 refills | Status: DC

## 2017-06-13 ENCOUNTER — Encounter: Payer: Self-pay | Admitting: Family Medicine

## 2017-06-13 ENCOUNTER — Other Ambulatory Visit: Payer: Self-pay

## 2017-06-13 ENCOUNTER — Ambulatory Visit (INDEPENDENT_AMBULATORY_CARE_PROVIDER_SITE_OTHER): Payer: Medicare Other | Admitting: Family Medicine

## 2017-06-13 VITALS — BP 132/80 | HR 55 | Temp 97.7°F | Ht 76.0 in | Wt 238.4 lb

## 2017-06-13 DIAGNOSIS — Z7901 Long term (current) use of anticoagulants: Secondary | ICD-10-CM

## 2017-06-13 DIAGNOSIS — Z5181 Encounter for therapeutic drug level monitoring: Secondary | ICD-10-CM | POA: Diagnosis not present

## 2017-06-13 DIAGNOSIS — R350 Frequency of micturition: Secondary | ICD-10-CM

## 2017-06-13 DIAGNOSIS — I480 Paroxysmal atrial fibrillation: Secondary | ICD-10-CM

## 2017-06-13 LAB — POCT URINALYSIS DIP (MANUAL ENTRY)
BILIRUBIN UA: NEGATIVE mg/dL
Bilirubin, UA: NEGATIVE
Glucose, UA: NEGATIVE mg/dL
Nitrite, UA: NEGATIVE
SPEC GRAV UA: 1.015 (ref 1.010–1.025)
UROBILINOGEN UA: 0.2 U/dL
pH, UA: 6.5 (ref 5.0–8.0)

## 2017-06-13 LAB — POCT UA - MICROSCOPIC ONLY

## 2017-06-13 LAB — POCT INR: INR: 2.3

## 2017-06-13 MED ORDER — RIVAROXABAN 20 MG PO TABS: 20.0000 mg | ORAL_TABLET | Freq: Every day | ORAL | 3 refills | Status: DC

## 2017-06-13 MED ORDER — CEPHALEXIN 500 MG PO CAPS: 500.0000 mg | ORAL_CAPSULE | Freq: Four times a day (QID) | ORAL | 0 refills | Status: AC

## 2017-06-13 NOTE — Progress Notes (Signed)
   Subjective:   Patient ID: Brian Burns    DOB: 1945-10-31, 72 y.o. male   MRN: [id number]  CC: discuss DOAC, urinary frequency   HPI: Brian Burns is a 72 y.o. male who presents to clinic today to discuss the following.  Chronic anticoagulation Patient has h/o of afib with CHADS-VASc score of 6.  On chronic anticoagulation with coumadin.  Most recent INR from 2 weeks ago was within therapeutic range at 2.5.  He reports good medication compliance, does not miss doses of coumadin at home.  He is here today to discuss transitioning to a DOAC.  No reported bleeding.      Urinary frequency Reports frequency over the last 2 weeks.  Wakes up 3-4x a night.  Drinks about 1 bottle of water and 2 sodas a day. UA obtained during clinic visit.   Denies burning with urination. Has a history of BPH, bladder outlet obstruction and urethral stricture in the past.  Underwent a urethroplasty on 01/17/17 with buccal mucosa graft with Dr. Marlou Porch (urology).  He reports he was last seen by Urology last month and they cleared him.   ROS: No fever, chills, nausea, vomiting.  No abdominal pain, flank pain, or shortness of breath.   Social: pt is a former smoker, quit 2001 Medications reviewed Objective:   BP 132/80   Pulse (!) 55   Temp 97.7 F (36.5 C) (Oral)   Ht 6\' 4"  (1.93 m)   Wt 238 lb 6.4 oz (108.1 kg)   SpO2 95%   BMI 29.02 kg/m  Vitals and nursing note reviewed.  General: pleasant 72 yo male, NAD  HEENT: NCAT, MMM Neck: supple CV: RRR no MRG Lungs: CTAB, normal effort  Skin: warm, dry, no rash, brisk cap refill  MSK: no CVA tenderness noted Extremities: warm and well perfused, normal tone  Assessment & Plan:   Paroxysmal atrial fibrillation (HCC) On chronic anticoagulation with coumadin.  INR 2.3 today, no active bleeding.  He is a good candidate to transition to DOAC.  Discussed at today's visit and pt and daughter agreeable to this.   -Pt advised to discontinue coumadin and  start Xarelto 20 mg daily; all questions and concerns addressed.  Precepted with Dr. Leveda Anna and Dr. Raymondo Band.   -Return precautions discussed   Urinary frequency Likely related to h/o BPH.  UA obtained this office visit which showed 3+ leuks, protein 30, trace blood, many bacteria, TNTC wbc.  Will treat with course of antibiotics. -Rx: keflex x7d -return precautions discussed   Orders Placed This Encounter  Procedures  . POCT urinalysis dipstick  . POCT UA - Microscopic Only  . POCT INR  . POCT INR    This back office order was created through the Results Console.   Meds ordered this encounter  Medications  . cephALEXin (KEFLEX) 500 MG capsule    Sig: Take 1 capsule (500 mg total) by mouth 4 (four) times daily for 8 days. Take for 7 days    Dispense:  28 capsule    Refill:  0  . rivaroxaban (XARELTO) 20 MG TABS tablet    Sig: Take 1 tablet (20 mg total) by mouth daily with supper.    Dispense:  90 tablet    Refill:  3    Freddrick March, MD [facility], PGY-2 06/19/2017 4:48 PM

## 2017-06-13 NOTE — Patient Instructions (Addendum)
It was nice seeing you again today.    We checked your urine since you have been urinating more frequently and it showed bacteria and possible infection.  I have sent in an antibiotic to your pharmacy.  Please make sure you complete the full course as prescribed.    For your chronic anticoagulation, we checked your INR today and it was within normal range.  I have started you on a newer medication called Xarelto (20 mg daily).  You can discontinue the coumadin and take this daily instead. You do not need to be seen for INR checks now that you have been switched over.  If you notice any bleeding or other side effect, please call to be seen by a provider.    Be well, Freddrick March, MD

## 2017-06-19 NOTE — Assessment & Plan Note (Addendum)
On chronic anticoagulation with coumadin.  INR 2.3 today, no active bleeding.  He is a good candidate to transition to DOAC.  Discussed at today's visit and pt and daughter agreeable to this.   -Pt advised to discontinue coumadin and start Xarelto 20 mg daily; all questions and concerns addressed.  Precepted with Dr. Leveda Anna and Dr. Raymondo Band.   -Return precautions discussed

## 2017-06-19 NOTE — Assessment & Plan Note (Signed)
Likely related to h/o BPH.  UA obtained this office visit which showed 3+ leuks, protein 30, trace blood, many bacteria, TNTC wbc.  Will treat with course of antibiotics. -Rx: keflex x7d -return precautions discussed

## 2017-07-14 ENCOUNTER — Ambulatory Visit (HOSPITAL_COMMUNITY)
Admission: EM | Admit: 2017-07-14 | Discharge: 2017-07-14 | Disposition: A | Discharge status: Home / Self Care | Payer: Medicare Other | Attending: Internal Medicine | Admitting: Internal Medicine

## 2017-07-14 ENCOUNTER — Ambulatory Visit (INDEPENDENT_AMBULATORY_CARE_PROVIDER_SITE_OTHER): Payer: Medicare Other

## 2017-07-14 ENCOUNTER — Encounter (HOSPITAL_COMMUNITY): Payer: Self-pay | Admitting: Emergency Medicine

## 2017-07-14 ENCOUNTER — Ambulatory Visit: Payer: Medicare Other

## 2017-07-14 DIAGNOSIS — K859 Acute pancreatitis without necrosis or infection, unspecified: Secondary | ICD-10-CM | POA: Diagnosis not present

## 2017-07-14 DIAGNOSIS — I2510 Atherosclerotic heart disease of native coronary artery without angina pectoris: Secondary | ICD-10-CM | POA: Diagnosis not present

## 2017-07-14 DIAGNOSIS — Z79899 Other long term (current) drug therapy: Secondary | ICD-10-CM | POA: Insufficient documentation

## 2017-07-14 DIAGNOSIS — I714 Abdominal aortic aneurysm, without rupture: Secondary | ICD-10-CM | POA: Diagnosis not present

## 2017-07-14 DIAGNOSIS — R079 Chest pain, unspecified: Secondary | ICD-10-CM | POA: Diagnosis not present

## 2017-07-14 DIAGNOSIS — Z881 Allergy status to other antibiotic agents status: Secondary | ICD-10-CM | POA: Diagnosis not present

## 2017-07-14 DIAGNOSIS — R7303 Prediabetes: Secondary | ICD-10-CM | POA: Diagnosis not present

## 2017-07-14 DIAGNOSIS — Z8249 Family history of ischemic heart disease and other diseases of the circulatory system: Secondary | ICD-10-CM | POA: Insufficient documentation

## 2017-07-14 DIAGNOSIS — I739 Peripheral vascular disease, unspecified: Secondary | ICD-10-CM | POA: Diagnosis not present

## 2017-07-14 DIAGNOSIS — R82998 Other abnormal findings in urine: Secondary | ICD-10-CM

## 2017-07-14 DIAGNOSIS — Z8673 Personal history of transient ischemic attack (TIA), and cerebral infarction without residual deficits: Secondary | ICD-10-CM | POA: Diagnosis not present

## 2017-07-14 DIAGNOSIS — G8929 Other chronic pain: Secondary | ICD-10-CM | POA: Insufficient documentation

## 2017-07-14 DIAGNOSIS — R109 Unspecified abdominal pain: Secondary | ICD-10-CM | POA: Insufficient documentation

## 2017-07-14 DIAGNOSIS — Z841 Family history of disorders of kidney and ureter: Secondary | ICD-10-CM | POA: Insufficient documentation

## 2017-07-14 DIAGNOSIS — Z836 Family history of other diseases of the respiratory system: Secondary | ICD-10-CM | POA: Diagnosis not present

## 2017-07-14 DIAGNOSIS — I4891 Unspecified atrial fibrillation: Secondary | ICD-10-CM | POA: Diagnosis not present

## 2017-07-14 DIAGNOSIS — Z87440 Personal history of urinary (tract) infections: Secondary | ICD-10-CM | POA: Insufficient documentation

## 2017-07-14 DIAGNOSIS — Z9889 Other specified postprocedural states: Secondary | ICD-10-CM | POA: Diagnosis not present

## 2017-07-14 DIAGNOSIS — R0602 Shortness of breath: Secondary | ICD-10-CM | POA: Diagnosis not present

## 2017-07-14 DIAGNOSIS — Z884 Allergy status to anesthetic agent status: Secondary | ICD-10-CM | POA: Diagnosis not present

## 2017-07-14 DIAGNOSIS — Z7901 Long term (current) use of anticoagulants: Secondary | ICD-10-CM | POA: Insufficient documentation

## 2017-07-14 DIAGNOSIS — E785 Hyperlipidemia, unspecified: Secondary | ICD-10-CM | POA: Insufficient documentation

## 2017-07-14 DIAGNOSIS — K219 Gastro-esophageal reflux disease without esophagitis: Secondary | ICD-10-CM | POA: Diagnosis not present

## 2017-07-14 DIAGNOSIS — N529 Male erectile dysfunction, unspecified: Secondary | ICD-10-CM | POA: Insufficient documentation

## 2017-07-14 DIAGNOSIS — I10 Essential (primary) hypertension: Secondary | ICD-10-CM | POA: Diagnosis not present

## 2017-07-14 LAB — POCT URINALYSIS DIP (DEVICE)
Bilirubin Urine: NEGATIVE
Glucose, UA: NEGATIVE mg/dL
Ketones, ur: NEGATIVE mg/dL
Nitrite: NEGATIVE
Protein, ur: 100 mg/dL — AB
Specific Gravity, Urine: 1.02 (ref 1.005–1.030)
Urobilinogen, UA: 1 mg/dL (ref 0.0–1.0)
pH: 6 (ref 5.0–8.0)

## 2017-07-14 MED ORDER — ACETAMINOPHEN-CODEINE #3 300-30 MG PO TABS: 1.0000 | ORAL_TABLET | Freq: Four times a day (QID) | ORAL | 0 refills | Status: DC | PRN

## 2017-07-14 NOTE — Discharge Instructions (Addendum)
Hydrate well with at least 2 liters (1 gallon) of water daily.  Recheck back with your urologist regarding your urinary symptoms.  If the urine culture turns out to be positive, we will send a prescription to your pharmacy electronically for an antibiotic course based off of those results. You may take 500mg  Tylenol every 6 hours for pain and inflammation.  Use Tylenol 3 for breakthrough pain.

## 2017-07-14 NOTE — ED Triage Notes (Signed)
Pt here for right sided rib pain and pain with inspiration x 2 days

## 2017-07-14 NOTE — ED Provider Notes (Addendum)
MRN: [id number] DOB: 01-01-1946  Subjective:   Brian Burns is a 72 y.o. male presenting for 2-day history of right flank pain.  Patient reports that he had bent down and thinks he twisted wrong when he came up.  His pain has been achy, intermittent and initially had trouble with it even with breathing but is not the case today.  Denies fever, nausea, vomiting, dysuria, hematuria, urinary frequency, constipation, bloody stools, renal stones.  He did have a surgical procedure to correct a urinary stricture in December 2018.  Was also recently treated with amoxicillin for urinary tract infection in May 2019.  His daughter would like to have his urine checked today.  No current facility-administered medications for this encounter.   Current Outpatient Medications:  .  amLODipine (NORVASC) 10 MG tablet, Take 1 tablet (10 mg total) by mouth daily., Disp: 90 tablet, Rfl: 3 .  atorvastatin (LIPITOR) 40 MG tablet, TAKE 1 TABLET BY MOUTH ONCE DAILY, Disp: 90 tablet, Rfl: 0 .  citalopram (CELEXA) 20 MG tablet, Take 1 tablet (20 mg total) by mouth daily., Disp: 90 tablet, Rfl: 2 .  CREON 12000 units CPEP capsule, take 1 capsule by mouth three times a day with meals, Disp: 270 capsule, Rfl: 3 .  ferrous sulfate 325 (65 FE) MG tablet, take 1 tablet by mouth three times a day with meals, Disp: 90 tablet, Rfl: 3 .  finasteride (PROSCAR) 5 MG tablet, Take 1 tablet (5 mg total) by mouth daily., Disp: 90 tablet, Rfl: 2 .  gabapentin (NEURONTIN) 100 MG capsule, Take 2 capsules (200 mg total) by mouth 3 (three) times daily., Disp: 180 capsule, Rfl: 2 .  lisinopril (PRINIVIL,ZESTRIL) 10 MG tablet, TAKE 1 TABLET BY MOUTH ONCE DAILY, Disp: 90 tablet, Rfl: 1 .  pantoprazole (PROTONIX) 40 MG tablet, Take 1 tablet (40 mg total) by mouth daily., Disp: 30 tablet, Rfl: 0 .  rivaroxaban (XARELTO) 20 MG TABS tablet, Take 1 tablet (20 mg total) by mouth daily with supper., Disp: 90 tablet, Rfl: 3 .  tamsulosin (FLOMAX) 0.4 MG  CAPS capsule, Take 1 capsule (0.4 mg total) by mouth daily., Disp: 30 capsule, Rfl: 3    Allergies  Allergen Reactions  . Novocain [Procaine Hcl] Nausea And Vomiting  . Doxycycline Other (See Comments)    Reaction unknown    Past Medical History:  Diagnosis Date  . AAA (abdominal aortic aneurysm) without rupture (HCC) 06/2014   no change in last exam from 2015  . Arthritis    knees,lower back  . Atrial fibrillation (HCC)   . Atrial flutter with rapid ventricular response (HCC) 01/16/2015  . CAD (coronary artery disease)    pt denies  . Carotid artery disease (HCC)   . Chronic back pain   . Dyspnea   . Erectile dysfunction   . GERD (gastroesophageal reflux disease)   . Headache    hx of  . History of CVA (cerebrovascular accident)    2009  &  2013  . HLD (hyperlipidemia)   . Hypertension   . Idiopathic pancreatitis 12/2014   acute  . Pre-diabetes    per pt. no meds  . PVD (peripheral vascular disease) with claudication [facility])      Past Surgical History:  Procedure Laterality Date  . back injections     [facility]  . CARDIOVASCULAR STRESS TEST  08-09-2009   mild global hypokinesis,  no ischemia or scar/  ef 41%  . CATARACT EXTRACTION W/ INTRAOCULAR LENS  IMPLANT,  BILATERAL  april and may 2014  . CYSTOSCOPY N/A 01/17/2017   Procedure: CYSTOSCOPY FLEXIBLE;  Surgeon: Crist Fat, MD;  Location: [facility];  Service: Urology;  Laterality: N/A;  . CYSTOSCOPY WITH RETROGRADE URETHROGRAM N/A 11/24/2014   Procedure: CYSTOSCOPY WITH RETROGRADE URETHROGRAM;  Surgeon: Crist Fat, MD;  Location: [facility];  Service: Urology;  Laterality: N/A;  . CYSTOSCOPY WITH URETHRAL DILATATION N/A 11/24/2014   Procedure: CYSTOSCOPY WITH URETHRAL DILATATION;  Surgeon: Crist Fat, MD;  Location: [facility];  Service: Urology;  Laterality: N/A;  BALLOON DILATION        . ESOPHAGOGASTRODUODENOSCOPY N/A 05/15/2014   Procedure:  ESOPHAGOGASTRODUODENOSCOPY (EGD);  Surgeon: Carman Ching, MD;  Location: [facility];  Service: Endoscopy;  Laterality: N/A;  . ESOPHAGOGASTRODUODENOSCOPY Left 02/02/2015   Procedure: ESOPHAGOGASTRODUODENOSCOPY (EGD);  Surgeon: Dorena Cookey, MD;  Location[facility];  Service: Endoscopy;  Laterality: Left;  . ESOPHAGOGASTRODUODENOSCOPY Left 03/16/2015   Procedure: ESOPHAGOGASTRODUODENOSCOPY (EGD);  Surgeon: Dorena Cookey, MD;  Location: [facility];  Service: Endoscopy;  Laterality: Left;  . TRANSTHORACIC ECHOCARDIOGRAM  01-07-2012   mild to moderate dilated LV,  ef 45-50%,  mild basal hypokinesis,  grade I diastolic  dysfunction/  trivial AR/  mild MR  . URETHROPLASTY N/A 01/17/2017   Procedure: URETHEROPLASTY BUCCAL GRAFT AND BUCCAL MUCOSA GRAFT HARVEST;  Surgeon: Crist Fat, MD;  Location: [facility];  Service: Urology;  Laterality: N/A;    Family History  Problem Relation Age of Onset  . Lung disease Brother   . Kidney disease Brother   . Heart disease Brother        died at 94  . Heart disease Mother        died at 70  . Kidney disease Mother   . Lung disease Mother      Immunization History  Administered Date(s) Administered  . Influenza,inj,Quad PF,6+ Mos 02/16/2013, 10/21/2013, 01/16/2015, 10/09/2015  . Pneumococcal Conjugate-13 07/20/2015  . Td 05/29/2002  . Zoster Recombinat (Shingrix) 08/28/2016       Objective:   Vitals: BP (!) 149/91 (BP Location: Left Arm)   Pulse (!) 59   Temp 97.8 F (36.6 C) (Oral)   Resp 18   SpO2 98%   Physical Exam  Constitutional: He is oriented to person, place, and time. He appears well-developed and well-nourished.  Cardiovascular: Normal rate, regular rhythm and intact distal pulses. Exam reveals no gallop and no friction rub.  No murmur heard. Pulmonary/Chest: No respiratory distress. He has no wheezes. He has no rales.  Abdominal: Soft. He exhibits no distension. There is no tenderness. There is no guarding.     Neurological: He is alert and oriented to person, place, and time.  Skin: Skin is warm and dry.    Results for orders placed or performed during the hospital encounter of 07/14/17 (from the past 24 hour(s))  POCT urinalysis dip (device)     Status: Abnormal   Collection Time: 07/14/17 12:35 PM  Result Value Ref Range   Glucose, UA NEGATIVE NEGATIVE mg/dL   Bilirubin Urine NEGATIVE NEGATIVE   Ketones, ur NEGATIVE NEGATIVE mg/dL   Specific Gravity, Urine 1.020 1.005 - 1.030   Hgb urine dipstick TRACE (A) NEGATIVE   pH 6.0 5.0 - 8.0   Protein, ur 100 (A) NEGATIVE mg/dL   Urobilinogen, UA 1.0 0.0 - 1.0 mg/dL   Nitrite NEGATIVE NEGATIVE   Leukocytes, UA MODERATE (A) NEGATIVE    Dg Chest 2 View  Result Date:  07/14/2017 CLINICAL DATA:  Shortness of breath and chest pain since yesterday. History of atrial fibrillation, coronary artery disease and peripheral vascular disease, former smoker. EXAM: CHEST - 2 VIEW COMPARISON:  Portable chest x-ray of January 13, 2016. FINDINGS: The lungs are well-expanded. There is no focal infiltrate. The interstitial markings are mildly prominent though stable. The heart is top-normal in size. The pulmonary vascularity is normal. There is calcification in the wall of the aortic arch. The bony thorax exhibits no acute abnormality. IMPRESSION: Mild chronic bronchitic changes, stable. No acute cardiopulmonary abnormality. Electronically Signed   By: David  [place] M.D.   On: 07/14/2017 12:04   Assessment and Plan :   Right flank pain  History of UTI  Leukocytes in urine  Counseled on conservative management for musculoskeletal type pain with scheduled Tylenol, use Tylenol 3 for breakthrough pain.  Patient does not hydrate well, drinks less than half a bottle of water daily.  Recommended that we hold off on using antibiotics until we see urine culture and in the meantime patient is to hydrate adequately every day.  He can also follow-up with his urologist.      Wallis Bamberg, PA-C 07/14/17 1304

## 2017-07-16 ENCOUNTER — Encounter: Payer: Self-pay | Admitting: Podiatry

## 2017-07-16 ENCOUNTER — Telehealth (HOSPITAL_COMMUNITY): Payer: Self-pay

## 2017-07-16 ENCOUNTER — Ambulatory Visit (INDEPENDENT_AMBULATORY_CARE_PROVIDER_SITE_OTHER): Payer: Medicare Other | Admitting: Podiatry

## 2017-07-16 DIAGNOSIS — M79674 Pain in right toe(s): Secondary | ICD-10-CM | POA: Diagnosis not present

## 2017-07-16 DIAGNOSIS — B351 Tinea unguium: Secondary | ICD-10-CM | POA: Diagnosis not present

## 2017-07-16 DIAGNOSIS — M79675 Pain in left toe(s): Secondary | ICD-10-CM | POA: Diagnosis not present

## 2017-07-16 DIAGNOSIS — E1159 Type 2 diabetes mellitus with other circulatory complications: Secondary | ICD-10-CM

## 2017-07-16 DIAGNOSIS — D689 Coagulation defect, unspecified: Secondary | ICD-10-CM | POA: Diagnosis not present

## 2017-07-16 LAB — URINE CULTURE: Culture: >=100000 — AB

## 2017-07-16 NOTE — Telephone Encounter (Signed)
Urine culture positive for e coli, this was not treated at urgent care visit. Rx for Bactrim DS sent to pharmacy of record. Attempted to reach patient. No answer at this time.

## 2017-07-16 NOTE — Progress Notes (Signed)
Patient ID: Brian Burns, male   DOB: 08/10/1945, 72 y.o.   MRN: [id number] Complaint:  Visit Type: Patient returns to my office for continued preventative foot care services. Complaint: Patient states" my nails have grown long and thick and become painful to walk and wear shoes" Patient has been diagnosed with borderline diabetic with PVD and peripheral neuropathy.. He presents for preventative foot care services. No changes to ROS.  Patient is taking coumadin. Podiatric Exam: Vascular: dorsalis pedis and posterior tibial pulses are palpable bilateral. Capillary return is immediate. Temperature gradient is WNL. Skin turgor WNL  Sensorium: Normal Semmes Weinstein monofilament test. Normal tactile sensation bilaterally. Nail Exam: Pt has thick disfigured discolored nails with subungual debris noted bilateral entire nail hallux through fifth toenails Ulcer Exam: There is no evidence of ulcer or pre-ulcerative changes or infection. Orthopedic Exam: Muscle tone and strength are WNL. No limitations in general ROM. No crepitus or effusions noted. Foot type and digits show no abnormalities. Bony prominences are unremarkable. Skin: No Porokeratosis. No infection or ulcers  Diagnosis:  Tinea unguium, Pain in right toe, pain in left toes  Treatment & Plan Procedures and Treatment: Consent by patient was obtained for treatment procedures. The patient understood the discussion of treatment and procedures well. All questions were answered thoroughly reviewed. Debridement of mycotic and hypertrophic toenails, 1 through 5 bilateral and clearing of subungual debris. No ulceration, no infection noted.  Return Visit-Office Procedure: Patient instructed to return to the office for a follow up visit 3 months for continued evaluation and treatment.  Monic Engelmann DPM 

## 2017-07-22 ENCOUNTER — Telehealth (HOSPITAL_COMMUNITY): Payer: Self-pay

## 2017-07-22 MED ORDER — SULFAMETHOXAZOLE-TRIMETHOPRIM 800-160 MG PO TABS: 1.0000 | ORAL_TABLET | Freq: Two times a day (BID) | ORAL | 0 refills | Status: AC

## 2017-07-24 ENCOUNTER — Ambulatory Visit (INDEPENDENT_AMBULATORY_CARE_PROVIDER_SITE_OTHER)
Admission: RE | Admit: 2017-07-24 | Discharge: 2017-07-24 | Disposition: A | Discharge status: Home / Self Care | Payer: Medicare Other | Source: Ambulatory Visit | Attending: Family | Admitting: Family

## 2017-07-24 ENCOUNTER — Encounter: Payer: Self-pay | Admitting: Family

## 2017-07-24 ENCOUNTER — Ambulatory Visit (INDEPENDENT_AMBULATORY_CARE_PROVIDER_SITE_OTHER): Payer: Medicare Other | Admitting: Family

## 2017-07-24 ENCOUNTER — Ambulatory Visit (HOSPITAL_COMMUNITY)
Admission: RE | Admit: 2017-07-24 | Discharge: 2017-07-24 | Disposition: A | Discharge status: Home / Self Care | Payer: Medicare Other | Source: Ambulatory Visit | Attending: Family | Admitting: Family

## 2017-07-24 VITALS — BP 138/81 | HR 45 | Temp 96.9°F | Ht 76.0 in | Wt 236.0 lb

## 2017-07-24 DIAGNOSIS — R0989 Other specified symptoms and signs involving the circulatory and respiratory systems: Secondary | ICD-10-CM

## 2017-07-24 DIAGNOSIS — I714 Abdominal aortic aneurysm, without rupture, unspecified: Secondary | ICD-10-CM

## 2017-07-24 DIAGNOSIS — I2510 Atherosclerotic heart disease of native coronary artery without angina pectoris: Secondary | ICD-10-CM | POA: Diagnosis not present

## 2017-07-24 DIAGNOSIS — I739 Peripheral vascular disease, unspecified: Secondary | ICD-10-CM

## 2017-07-24 DIAGNOSIS — I724 Aneurysm of artery of lower extremity: Secondary | ICD-10-CM | POA: Diagnosis not present

## 2017-07-24 DIAGNOSIS — I723 Aneurysm of iliac artery: Secondary | ICD-10-CM | POA: Insufficient documentation

## 2017-07-24 DIAGNOSIS — E1151 Type 2 diabetes mellitus with diabetic peripheral angiopathy without gangrene: Secondary | ICD-10-CM | POA: Diagnosis not present

## 2017-07-24 DIAGNOSIS — Z87891 Personal history of nicotine dependence: Secondary | ICD-10-CM

## 2017-07-24 NOTE — Patient Instructions (Addendum)
Before your next abdominal ultrasound:  Take two Extra-Strength Gas-X capsules at bedtime the night before the test. Take another two Extra-Strength Gas-X capsules 3 hours before the test.  Avoid gas forming foods and beverages the day before the test.      Abdominal Aortic Aneurysm Blood pumps away from the heart through tubes (blood vessels) called arteries. Aneurysms are weak or damaged places in the wall of an artery. It bulges out like a balloon. An abdominal aortic aneurysm happens in the main artery of the body (aorta). It can burst or tear, causing bleeding inside the body. This is an emergency. It needs treatment right away. What are the causes? The exact cause is unknown. Things that could cause this problem include:  Fat and other substances building up in the lining of a tube.  Swelling of the walls of a blood vessel.  Certain tissue diseases.  Belly (abdominal) trauma.  An infection in the main artery of the body.  What increases the risk? There are things that make it more likely for you to have an aneurysm. These include:  Being over the age of 70 years old.  Having high blood pressure (hypertension).  Being a male.  Being white.  Being very overweight (obese).  Having a family history of aneurysm.  Using tobacco products.  What are the signs or symptoms? Symptoms depend on the size of the aneurysm and how fast it grows. There may not be symptoms. If symptoms occur, they can include:  Pain (belly, side, lower back, or groin).  Feeling full after eating a small amount of food.  Feeling sick to your stomach (nauseous), throwing up (vomiting), or both.  Feeling a lump in your belly that feels like it is beating (pulsating).  Feeling like you will pass out (faint).  How is this treated?  Medicine to control blood pressure and pain.  Imaging tests to see if the aneurysm gets bigger.  Surgery. How is this prevented? To lessen your chance of  getting this condition:  Stop smoking. Stop chewing tobacco.  Limit or avoid alcohol.  Keep your blood pressure, blood sugar, and cholesterol within normal limits.  Eat less salt.  Eat foods low in saturated fats and cholesterol. These are found in animal and whole dairy products.  Eat more fiber. Fiber is found in whole grains, vegetables, and fruits.  Keep a healthy weight.  Stay active and exercise often.  This information is not intended to replace advice given to you by your health care provider. Make sure you discuss any questions you have with your health care provider. Document Released: 05/11/2012 Document Revised: 06/22/2015 Document Reviewed: 02/14/2012 Else[employer] Interactive Patient Education  2017 [employer]  

## 2017-07-24 NOTE — Progress Notes (Signed)
[facility] [place] HISTORY AND PHYSICAL   CC: Follow up AAA, left iliac artery aneurysm, and PAD   History of Present Illness:   Brian Burns is a 72 y.o. male patient of Dr. Montez Morita are following for mild peripheral arterial disease as well as abdominal aortic aneurysm.  He was referred to Dr. Shon Baton about 2015 for back pain. He states his back pain did improve a little bit with an injection. He is following up with Dr. Shon Baton on as-needed basis. He denies any abdominal pain. He states that he also has some lower back pain that eases a little with laying down, otherwise, nothing alleviates the pain. He denies claudication type  sx's in his legs with walking. He does not smoke and has a remote tobacco history. 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 His symptoms are very similar to his previous office visit in June of 2013. He denies any subsequent strokes or TIA.   He does not describe rest pain. He has no wounds on his feet. Chronic medical problems include hypertension, hyperlipidemia, chronic back pain, coronary disease of which are currently stable.   His walking seems limited by arthritis pain in both knees and chronic back pain, uses a cane. He denies issues with falling.  Dr. Darrick Penna last saw pt on. 07/07/14. At that time pt had complaints of bilateral thigh pain and lower back pain. His ABI's were essentially unchanged from his ABI's 3 years prior. The pain he described is not consistent with claudication. Small abdominal aortic aneurysm unchanged over the previous year.  He was hospitalized at [facility] in February 2017 for GI bleed, then SNF. He denies chest pain, denies dyspnea.   Diabetic: yes 6.5 A1C on 04-03-17 (review of records, pt is not taking any glycemic lowering agents) Tobacco use: former smoker, quit about 2002, but his wife and daughter occasionally smoke in his home  Pt meds  include: Statin :yes Betablocker: No ASA: no Other anticoagulants/antiplatelets: Xarelto, hx of atrial fib and atrial flutter     Current Outpatient Medications  Medication Sig Dispense Refill  . acetaminophen-codeine (TYLENOL #3) 300-30 MG tablet Take 1 tablet by mouth every 6 (six) hours as needed for severe pain. 15 tablet 0  . amLODipine (NORVASC) 10 MG tablet Take 1 tablet (10 mg total) by mouth daily. 90 tablet 3  . atorvastatin (LIPITOR) 40 MG tablet TAKE 1 TABLET BY MOUTH ONCE DAILY 90 tablet 0  . citalopram (CELEXA) 20 MG tablet Take 1 tablet (20 mg total) by mouth daily. 90 tablet 2  . CREON 12000 units CPEP capsule take 1 capsule by mouth three times a day with meals 270 capsule 3  . ferrous sulfate 325 (65 FE) MG tablet take 1 tablet by mouth three times a day with meals 90 tablet 3  . finasteride (PROSCAR) 5 MG tablet Take 1 tablet (5 mg total) by mouth daily. 90 tablet 2  . gabapentin (NEURONTIN) 100 MG capsule Take 2 capsules (200 mg total) by mouth 3 (three) times daily. 180 capsule 2  . lisinopril (PRINIVIL,ZESTRIL) 10 MG tablet TAKE 1 TABLET BY MOUTH ONCE DAILY 90 tablet 1  . pantoprazole (PROTONIX) 40 MG tablet Take 1 tablet (40 mg total) by mouth daily. 30 tablet 0  . rivaroxaban (XARELTO) 20 MG TABS tablet Take 1 tablet (20 mg total) by mouth daily with supper. 90 tablet 3  . sulfamethoxazole-trimethoprim (BACTRIM DS,SEPTRA DS)  800-160 MG tablet Take 1 tablet by mouth 2 (two) times daily for 5 days. 10 tablet 0  . tamsulosin (FLOMAX) 0.4 MG CAPS capsule Take 1 capsule (0.4 mg total) by mouth daily. 30 capsule 3   No current facility-administered medications for this visit.     Past Medical History:  Diagnosis Date  . AAA (abdominal aortic aneurysm) without rupture (HCC) 06/2014   no change in last exam from 2015  . Arthritis    knees,lower back  . Atrial fibrillation (HCC)   . Atrial flutter with rapid ventricular response (HCC) 01/16/2015  . CAD (coronary artery  disease)    pt denies  . Carotid artery disease (HCC)   . Chronic back pain   . Dyspnea   . Erectile dysfunction   . GERD (gastroesophageal reflux disease)   . Headache    hx of  . History of CVA (cerebrovascular accident)    2009  &  2013  . HLD (hyperlipidemia)   . Hypertension   . Idiopathic pancreatitis 12/2014   acute  . Pre-diabetes    per pt. no meds  . PVD (peripheral vascular disease) with claudication [facility])     Social History Social History   Tobacco Use  . Smoking status: Former Smoker    Packs/day: 0.50    Years: 40.00    Pack years: 20.00    Types: Cigarettes    Last attempt to quit: 01/29/1999    Years since quitting: 18.4  . Smokeless tobacco: Never Used  Substance Use Topics  . Alcohol use: No    Alcohol/week: 0.0 oz    Comment: hx of quit in 2006  . Drug use: No    Family History Family History  Problem Relation Age of Onset  . Lung disease Brother   . Kidney disease Brother   . Heart disease Brother        died at 47  . Heart disease Mother        died at 50  . Kidney disease Mother   . Lung disease Mother     Surgical History Past Surgical History:  Procedure Laterality Date  . back injections     [facility]  . CARDIOVASCULAR STRESS TEST  08-09-2009   mild global hypokinesis,  no ischemia or scar/  ef 41%  . CATARACT EXTRACTION W/ INTRAOCULAR LENS  IMPLANT, BILATERAL  april and may 2014  . CYSTOSCOPY N/A 01/17/2017   Procedure: CYSTOSCOPY FLEXIBLE;  Surgeon: Crist Fat, MD;  Location: [facility];  Service: Urology;  Laterality: N/A;  . CYSTOSCOPY WITH RETROGRADE URETHROGRAM N/A 11/24/2014   Procedure: CYSTOSCOPY WITH RETROGRADE URETHROGRAM;  Surgeon: Crist Fat, MD;  Location: [facility];  Service: Urology;  Laterality: N/A;  . CYSTOSCOPY WITH URETHRAL DILATATION N/A 11/24/2014   Procedure: CYSTOSCOPY WITH URETHRAL DILATATION;  Surgeon: Crist Fat, MD;  Location: [facility];  Service: Urology;  Laterality: N/A;  BALLOON DILATION        . ESOPHAGOGASTRODUODENOSCOPY N/A 05/15/2014   Procedure: ESOPHAGOGASTRODUODENOSCOPY (EGD);  Surgeon: Carman Ching, MD;  Location: [facility]COPY;  Service: Endoscopy;  Laterality: N/A;  . ESOPHAGOGASTRODUODENOSCOPY Left 02/02/2015   Procedure: ESOPHAGOGASTRODUODENOSCOPY (EGD);  Surgeon: Dorena Cookey, MD;  Location: [facility];  Service: Endoscopy;  Laterality: Left;  . ESOPHAGOGASTRODUODENOSCOPY Left 03/16/2015   Procedure: ESOPHAGOGASTRODUODENOSCOPY (EGD);  Surgeon: Dorena Cookey, MD;  Location: [facility];  Service: Endoscopy;  Laterality: Left;  . TRANSTHORACIC ECHOCARDIOGRAM  01-07-2012   mild to  moderate dilated LV,  ef 45-50%,  mild basal hypokinesis,  grade I diastolic  dysfunction/  trivial AR/  mild MR  . URETHROPLASTY N/A 01/17/2017   Procedure: URETHEROPLASTY BUCCAL GRAFT AND BUCCAL MUCOSA GRAFT HARVEST;  Surgeon: Crist Fat, MD;  Location: [facility];  Service: Urology;  Laterality: N/A;    Allergies  Allergen Reactions  . Novocain [Procaine Hcl] Nausea And Vomiting  . Doxycycline Other (See Comments)    Reaction unknown    Current Outpatient Medications  Medication Sig Dispense Refill  . acetaminophen-codeine (TYLENOL #3) 300-30 MG tablet Take 1 tablet by mouth every 6 (six) hours as needed for severe pain. 15 tablet 0  . amLODipine (NORVASC) 10 MG tablet Take 1 tablet (10 mg total) by mouth daily. 90 tablet 3  . atorvastatin (LIPITOR) 40 MG tablet TAKE 1 TABLET BY MOUTH ONCE DAILY 90 tablet 0  . citalopram (CELEXA) 20 MG tablet Take 1 tablet (20 mg total) by mouth daily. 90 tablet 2  . CREON 12000 units CPEP capsule take 1 capsule by mouth three times a day with meals 270 capsule 3  . ferrous sulfate 325 (65 FE) MG tablet take 1 tablet by mouth three times a day with meals 90 tablet 3  . finasteride (PROSCAR) 5 MG tablet Take 1 tablet (5 mg total) by mouth daily. 90 tablet 2  . gabapentin (NEURONTIN)  100 MG capsule Take 2 capsules (200 mg total) by mouth 3 (three) times daily. 180 capsule 2  . lisinopril (PRINIVIL,ZESTRIL) 10 MG tablet TAKE 1 TABLET BY MOUTH ONCE DAILY 90 tablet 1  . pantoprazole (PROTONIX) 40 MG tablet Take 1 tablet (40 mg total) by mouth daily. 30 tablet 0  . rivaroxaban (XARELTO) 20 MG TABS tablet Take 1 tablet (20 mg total) by mouth daily with supper. 90 tablet 3  . sulfamethoxazole-trimethoprim (BACTRIM DS,SEPTRA DS) 800-160 MG tablet Take 1 tablet by mouth 2 (two) times daily for 5 days. 10 tablet 0  . tamsulosin (FLOMAX) 0.4 MG CAPS capsule Take 1 capsule (0.4 mg total) by mouth daily. 30 capsule 3   No current facility-administered medications for this visit.      REVIEW OF SYSTEMS: See HPI for pertinent positives and negatives.  Physical Examination Vitals:   07/24/17 0850  BP: 138/81  Pulse: (!) 45  Temp: (!) 96.9 F (36.1 C)  TempSrc: Oral  SpO2: 99%  Weight: 236 lb (107 kg)  Height: 6\' 4"  (1.93 m)   Body mass index is 28.73 kg/m.  General:  WDWN in NAD Gait: Slow, slightly stooped, walking with cane HENT: Mostly edentulous  Eyes: Pupils equal Pulmonary: normal non-labored breathing, no rales, rhonchi, or wheezing. Limited air movement in all fields Cardiac: Regular rhythm, bradycardic (not on a beta blocker), no murmur detected Abdomen: soft, NT, no masses palpated Skin: no rashes, no ulcers, no cellulitis.  VASCULAR EXAM  Carotid Bruits Right Left   Negative Negative   Abdominal aortic pulse is not palpable Radial pulses are 2+ palpable and =   VASCULAR EXAM: Extremitiesno ischemic changes, no gangrene; no open wounds. 1+ bilateral pretibial pitting edema.   LE Pulses Right Left  FEMORAL 3+ palpable 3+ palpable   POPLITEAL 2+palpable  2+ palpable  POSTERIOR TIBIAL not palpable  not palpable   DORSALIS PEDIS ANTERIOR TIBIAL not palpable  not palpable      Musculoskeletal: no muscle wasting or atrophy.Slightly stooped posture. Neurologic:  A&O X 3; appropriate affect, sensation is normal; speech is somewhat slurred  and edentulous, CN 2-12 intact, pain and light touch intact in extremities, motor exam as listed above. Psychiatric: Normal thought content, mood appropriate to clinical situation.     ASSESSMENT:  Brian Burns is a 72 y.o. male who has chronic back pain from spine issues, no new back pain, no abdominal pain. He has no claudication sx's with walking which is limited by back pain/spine issues and arthritis/joint pain, no signs of ischemia in his feet/legs. Prominent popliteal pulses bilaterally, dilatation of the right proximal popliteal artery at 1.35 cm, mild dilatation of the left proximal popliteal artery at 1.17 cm.  AAA duplex today indicates no change in size of small AAA , 3.1 cm based on limited visualization, and stable diameter of left CIA at 2..4 cm today.   His atherosclerotic risk factors include DM (A1C 6.5 in March 2019, is not taking any glycemic lowering agents), CAD, and dyslipidemia.  He takes Adult nurse for a hx of atrial fib, takes a daily statin.   ABI's were stable a year ago with no significant disease on the right and mild disease in the left, tri and biphasic waveforms.    DATA  AAA Duplex  Previous:  3.15 cm (Date: 07/18/16), Right CIA: 1.87 cm; Left CIA: 2.34 cm Current: 3.1 cm (07-24-17); Right CIA: 1.7 cm; Left CIA: 2.4 cm Limited visualization of the abdominal vasculature due to overlying bowel gas.    Bilateral popliteal Artery Duplex (07-24-17): Right: Dilatation pf the proximal popliteal artery at 1.35 cm Left: Mild dilatation of the proximal popliteal artery at 1.17. Cm.     ABI (Date: 07/18/16):  R:  ? ABI: 1.07 (was 1.13 on 07-13-15),  ? PT: tri ? DP: bi ? TBI:  0.83 (was 0.78)  L:  ? ABI: 0.93 (was 0.89),  ? PT: bi ? DP: bi ? TBI: 0.83 (was 0.68) Stable bilateral  ABI: normal on the right , mild disease on the left, slight improvement in bilateral TBI    PLAN:   Graduated walking program discussed and how to achieve.   Based on today's exam and non-invasive vascular lab results, the patient will follow up in 1 year with the following tests: AAA duplex,  bilateral popliteal artery duplex, and ABI's.   I advised him to notify us if he develops concerns re the circulation in his calves or feet.   I discussed in depth with the patient the nature of atherosclerosis, and emphasized the importance of maximal medical management including strict control of blood pressure, blood glucose, and lipid levels, obtaining regular exercise, and cessation of smoking.  The patient is aware that without maximal medical management the underlying atherosclerotic disease process will progress, limiting the benefit of any interventions.  Consideration for repair of AAA would be made when the size approaches 5.0 cm, growth > 1 cm/yr, and symptomatic status. The patient was given information about AAA including signs, symptoms, treatment,  what symptoms should prompt the patient to seek immediate medical care, and how to minimize the risk of enlargement and rupture of aneurysms.   The patient was given information about PAD including signs, symptoms, treatment, what symptoms should prompt the patient to seek immediate medical care, and risk reduction measures to take.  Thank you for allowing [place] to participate in this patient's care.  Charisse March, RN, MSN, FNP-C [facility] Office: [phone]  Clinic MD: [facility] 07/24/2017 8:56 AM

## 2017-08-11 ENCOUNTER — Ambulatory Visit: Payer: Medicare Other | Admitting: Family Medicine

## 2017-08-11 ENCOUNTER — Other Ambulatory Visit: Payer: Self-pay | Admitting: *Deleted

## 2017-08-11 DIAGNOSIS — Z7901 Long term (current) use of anticoagulants: Secondary | ICD-10-CM

## 2017-08-13 MED ORDER — RIVAROXABAN 20 MG PO TABS: 20.0000 mg | ORAL_TABLET | Freq: Every day | ORAL | 3 refills | Status: DC

## 2017-08-13 MED ORDER — ATORVASTATIN CALCIUM 40 MG PO TABS: 40.0000 mg | ORAL_TABLET | Freq: Every day | ORAL | 1 refills | Status: DC

## 2017-08-22 ENCOUNTER — Other Ambulatory Visit: Payer: Self-pay

## 2017-08-22 DIAGNOSIS — G8929 Other chronic pain: Secondary | ICD-10-CM

## 2017-08-22 DIAGNOSIS — M545 Low back pain: Principal | ICD-10-CM

## 2017-08-22 MED ORDER — GABAPENTIN 100 MG PO CAPS: 200.0000 mg | ORAL_CAPSULE | Freq: Three times a day (TID) | ORAL | 2 refills | Status: DC

## 2017-08-25 ENCOUNTER — Encounter: Payer: Self-pay | Admitting: Family Medicine

## 2017-08-25 ENCOUNTER — Other Ambulatory Visit: Payer: Self-pay

## 2017-08-25 ENCOUNTER — Ambulatory Visit (HOSPITAL_COMMUNITY)
Admission: RE | Admit: 2017-08-25 | Discharge: 2017-08-25 | Disposition: A | Discharge status: Home / Self Care | Payer: Medicare Other | Source: Ambulatory Visit | Attending: Family Medicine | Admitting: Family Medicine

## 2017-08-25 ENCOUNTER — Ambulatory Visit (INDEPENDENT_AMBULATORY_CARE_PROVIDER_SITE_OTHER): Payer: Medicare Other | Admitting: Family Medicine

## 2017-08-25 VITALS — BP 160/82 | HR 98 | Temp 98.7°F | Ht 76.0 in | Wt 236.0 lb

## 2017-08-25 DIAGNOSIS — G8929 Other chronic pain: Secondary | ICD-10-CM | POA: Diagnosis not present

## 2017-08-25 DIAGNOSIS — M25561 Pain in right knee: Secondary | ICD-10-CM

## 2017-08-25 DIAGNOSIS — I739 Peripheral vascular disease, unspecified: Secondary | ICD-10-CM | POA: Insufficient documentation

## 2017-08-25 DIAGNOSIS — M170 Bilateral primary osteoarthritis of knee: Secondary | ICD-10-CM | POA: Diagnosis not present

## 2017-08-25 DIAGNOSIS — M238X2 Other internal derangements of left knee: Secondary | ICD-10-CM | POA: Diagnosis not present

## 2017-08-25 DIAGNOSIS — E119 Type 2 diabetes mellitus without complications: Secondary | ICD-10-CM

## 2017-08-25 DIAGNOSIS — M238X1 Other internal derangements of right knee: Secondary | ICD-10-CM | POA: Diagnosis not present

## 2017-08-25 LAB — POCT GLYCOSYLATED HEMOGLOBIN (HGB A1C): HBA1C, POC (CONTROLLED DIABETIC RANGE): 6.3 % (ref 0.0–7.0)

## 2017-08-25 NOTE — Progress Notes (Signed)
   Subjective:   Patient ID: Brian Burns    DOB: 1945/05/25, 72 y.o. male   MRN: [id number]  CC: R knee pain   HPI: Brian Burns is a 72 y.o. male who presents to clinic today for the following issue.  Chronic right knee pain Started about 1 year ago.  Denies any history of trauma or fall.  Takes two gabapentin and Tylenol with some improvement in symptoms.  Feels better when he stops and rests.  Worse with walking and climbing hills, ambulates with cane.   Elevates legs at bedtime.  Has a compression sleeve at home but has not been wearing this.  Denies numbness and tingling.  No weakness noted.  ROS: No fever, chills, nausea, vomiting.  No numbness or tingling.  Social: Patient is a former smoker, quit 2001. Medications reviewed. Objective:   BP (!) 160/82   Pulse 98   Temp 98.7 F (37.1 C) (Oral)   Ht 6\' 4"  (1.93 m)   Wt 236 lb (107 kg)   SpO2 95%   BMI 28.73 kg/m  Vitals and nursing note reviewed.  General: pleasant 72 yo male, NAD CV: RRR no MRG  Lungs: CTAB, normal effort  Abdomen: soft, NTND, no masses or organomegaly, +bs  Skin: warm, dry Extremities: warm and well perfused, normal tone  Right knee: Normal to inspection with no erythema or effusion or obvious bony abnormalities. Palpation normal with medial joint line tenderness, no patellar tenderness or condyle tenderness. ROM normal in flexion and extension and lower leg rotation. Ligaments with solid consistent endpoints including ACL, PCL, LCL, MCL. Negative Mcmurray's and provocative meniscal tests. Non painful patellar compression. Patellar and quadriceps tendons unremarkable. Hamstring and quadriceps strength is normal. Neuro: alert, oriented x3, no focal deficits   Assessment & Plan:   Chronic pain of right knee Chronic.  Suspect some component of lateral compartment OA.  Will proceed with imaging and conservative treatment for now.  -Will obtain bilateral standing knee plain films  -advised  to continue Tylenol prn  -apply ice, use compression sleeve, elevate legs when sitting or laying at home  -return if symptoms worsen or do not improve in 4-6 weeks   Orders Placed This Encounter  Procedures  . DG Knee Bilateral Standing AP    Standing Status:   Future    Number of Occurrences:   1    Standing Expiration Date:   02/21/2018    Order Specific Question:   Reason for Exam (SYMPTOM  OR DIAGNOSIS REQUIRED)    Answer:   ?right knee OA    Order Specific Question:   Preferred imaging location?    Answer:   [facility]  . POCT glycosylated hemoglobin (Hb A1C)   Follow up: 4-6 weeks   Freddrick March, MD [facility], PGY-3 08/26/2017 4:07 PM

## 2017-08-25 NOTE — Patient Instructions (Signed)
It was nice seeing you again today.  You were seen in clinic for right knee pain which is most likely due to osteoarthritis. I have ordered x-rays to evaluate for this.    You can go to the first floor radiology department in [facility] and do not need an appointment.  I will give you a call once I have the results of your imaging.  As we discussed, a compression sleeve can often help alleviate some of the symptoms of knee swelling.  Additionally, you can use ice, 20 minutes on, 20 minutes off and keep your legs elevated when sitting or laying. You can continue Tylenol as needed for pain control.   If you have any new or worsening symptoms, please call clinic to be seen by a provider.  Be well, Freddrick March MD

## 2017-08-26 NOTE — Assessment & Plan Note (Signed)
Chronic.  Suspect some component of lateral compartment OA.  Will proceed with imaging and conservative treatment for now.  -Will obtain bilateral standing knee plain films  -advised to continue Tylenol prn  -apply ice, use compression sleeve, elevate legs when sitting or laying at home  -return if symptoms worsen or do not improve in 4-6 weeks

## 2017-09-19 ENCOUNTER — Other Ambulatory Visit: Payer: Self-pay

## 2017-09-19 MED ORDER — TAMSULOSIN HCL 0.4 MG PO CAPS: 0.4000 mg | ORAL_CAPSULE | Freq: Every day | ORAL | 3 refills | Status: DC

## 2017-10-13 ENCOUNTER — Telehealth: Payer: Self-pay | Admitting: Family Medicine

## 2017-10-13 NOTE — Telephone Encounter (Signed)
Pt number was not in service.

## 2017-10-15 ENCOUNTER — Encounter: Payer: Self-pay | Admitting: Podiatry

## 2017-10-15 ENCOUNTER — Ambulatory Visit (INDEPENDENT_AMBULATORY_CARE_PROVIDER_SITE_OTHER): Payer: Medicare Other | Admitting: Podiatry

## 2017-10-15 DIAGNOSIS — M79675 Pain in left toe(s): Secondary | ICD-10-CM

## 2017-10-15 DIAGNOSIS — D689 Coagulation defect, unspecified: Secondary | ICD-10-CM | POA: Diagnosis not present

## 2017-10-15 DIAGNOSIS — E1159 Type 2 diabetes mellitus with other circulatory complications: Secondary | ICD-10-CM | POA: Diagnosis not present

## 2017-10-15 DIAGNOSIS — B351 Tinea unguium: Secondary | ICD-10-CM

## 2017-10-15 DIAGNOSIS — M79674 Pain in right toe(s): Secondary | ICD-10-CM

## 2017-10-15 NOTE — Progress Notes (Signed)
Patient ID: Brian Burns, male   DOB: [birth year], 72 y.o.   MRN: [id number] Complaint:  Visit Type: Patient returns to my office for continued preventative foot care services. Complaint: Patient states" my nails have grown long and thick and become painful to walk and wear shoes" Patient has been diagnosed with borderline diabetic with PVD and peripheral neuropathy.Marland Kitchen He presents for preventative foot care services. No changes to ROS.  Patient is taking coumadin. Podiatric Exam: Vascular: dorsalis pedis and posterior tibial pulses are palpable bilateral. Capillary return is immediate. Temperature gradient is WNL. Skin turgor WNL  Sensorium: Normal Semmes Weinstein monofilament test. Normal tactile sensation bilaterally. Nail Exam: Pt has thick disfigured discolored nails with subungual debris noted bilateral entire nail hallux through fifth toenails Ulcer Exam: There is no evidence of ulcer or pre-ulcerative changes or infection. Orthopedic Exam: Muscle tone and strength are WNL. No limitations in general ROM. No crepitus or effusions noted. Foot type and digits show no abnormalities. Bony prominences are unremarkable. Skin: No Porokeratosis. No infection or ulcers  Diagnosis:  Tinea unguium, Pain in right toe, pain in left toes  Treatment & Plan Procedures and Treatment: Consent by patient was obtained for treatment procedures. The patient understood the discussion of treatment and procedures well. All questions were answered thoroughly reviewed. Debridement of mycotic and hypertrophic toenails, 1 through 5 bilateral and clearing of subungual debris. No ulceration, no infection noted.  Return Visit-Office Procedure: Patient instructed to return to the office for a follow up visit 3 months for continued evaluation and treatment.  Helane Gunther DPM

## 2017-10-24 DIAGNOSIS — H40013 Open angle with borderline findings, low risk, bilateral: Secondary | ICD-10-CM | POA: Diagnosis not present

## 2017-11-13 ENCOUNTER — Other Ambulatory Visit: Payer: Self-pay | Admitting: Pharmacist

## 2017-11-13 NOTE — Patient Outreach (Addendum)
[facility]) Care Management  11/13/2017  Brian Burns 01-Aug-1945 [id number]   72 year old male referred to [facility] for medication review due to High Risk review of [employer] beneficiaries. PMHx includes, but not limited to, hx CVA, CHF, T2DM, Afib.   Patient was contacted today to review medications due to multiple HCC codes (DM, CHF, HTN) and elevated RAF score.   Attempted to contact multiple numbers on file, and unable to leave a message on anything but his wife's voicemail. Left HIPAA compliant message for patient to return my call at his convenience.   Of note, per [facility] data and per [facility] dispense report, patient has not filled lisinopril since 04/2017 for a 90 day supply.   Catie Feliz Beam, PharmD PGY2 Ambulatory Care Pharmacy Resident, [facility] Phone: [phone]

## 2017-11-21 ENCOUNTER — Other Ambulatory Visit: Payer: Self-pay | Admitting: Pharmacist

## 2017-11-21 NOTE — Patient Outreach (Signed)
[facility]) Care Management  11/21/2017  JEROMEY CUNY 02/16/1945 [id number]   Received confirmation from Dr. Rennis Golden that if the patient isn't taking lisinopril currently to remove it from his list.   Medication list updated.    Catie Feliz Beam, PharmD PGY2 Ambulatory Care Pharmacy Resident, [facility] Phone: [phone]

## 2017-11-21 NOTE — Patient Outreach (Addendum)
[facility]) Care Management  [place]   11/21/2017  DJUAN TALTON 1945/03/04 [id number]  72 year old male referred to [facility] for medication review due to High Risk review of [employer] beneficiaries. PMHx includes, but not limited to,CAD w/ hx TIA, AAA, CHF, paroxysmal AF, T2DM, BPH   Patient was contacted today to review medications due to multiple [place] codes (CHF, DM) and elevated RAF score. HIPAA verifiers identified. Patient is difficult to understand over the phone, and we began a medication review, but he is unclear about many of the medications he takes. He notes that his daughter fills a pill box for him, and that it would be better for me to review the medications with his daughter. Daughter Ladona Horns called me back, HIPAA verifiers identified.   Medication Adherence - Cathey noted that Dr. Debara Pickett discontinued lisinopril. Upon chart review, Dr. Debara Pickett d/c lisinopril after 10/04/2016 BMP showed an increase in serum creatinine, however, also sent in a refill of this medication on 03/08/2017. Patient has not been taking lisinopril.   Depressed Mood/Pain - Cathey notes that she is concerned about her father's mood; she describes that he is moving much slower and spends much of the day in bed. She notes that pain may be contributing, as they have ran out of acetaminophen-codeine and tramadol. She asks about his citalopram and if the dose can be increased. She also asks about getting an antiinflammatory medication (specifically asks about Celebrex) to help with his pain  Medication Refills - Notes that they have run out of pantoprazole; she states she submitted a refill request but never heard back.   Objective: Lab Results  Component Value Date   CREATININE 1.41 (H) 01/18/2017   CREATININE 1.56 (H) 01/16/2017   CREATININE 1.62 (H) 10/03/2016  eGFR ~ 57 mL/min/1.48m, CrCl ~ 65 mL/min  Lab Results  Component Value Date   HGBA1C 6.3  08/25/2017    Lipid Panel     Component Value Date/Time   CHOL 167 07/20/2015 1502   TRIG 129 07/20/2015 1502   HDL 51 07/20/2015 1502   CHOLHDL 3.3 07/20/2015 1502   VLDL 26 07/20/2015 1502   LDLCALC 90 07/20/2015 1502   LDLDIRECT 175 (H) 04/09/2011 1122    BP Readings from Last 3 Encounters:  08/25/17 (!) 160/82  07/24/17 138/81  07/14/17 (!) 149/91    Allergies  Allergen Reactions  . Novocain [Procaine Hcl] Nausea And Vomiting  . Doxycycline Other (See Comments)    Reaction unknown    Medications Reviewed Today    Reviewed by [facility]head [facility](Pharmacist) on 11/21/17 at 1128  Med List Status: <None>  Medication Order Taking? Sig Documenting Provider Last Dose Status Informant  acetaminophen-codeine (TYLENOL #3) 300-30 MG tablet [id number] Take 1 tablet by mouth every 6 (six) hours as needed for severe pain.  Patient not taking:  Reported on 11/21/2017   MJaynee Eagles PA-C Not Taking Active            Med Note (Darnelle Maffucci CArville Lime  Fri Nov 21, 2017 11:03 AM)    amLODipine (NORVASC) 10 MG tablet [id number] Take 1 tablet (10 mg total) by mouth daily. ALovenia Kim MD Taking Active   atorvastatin (LIPITOR) 40 MG tablet [id number] Take 1 tablet (40 mg total) by mouth daily. ALovenia Kim MD Taking Active   citalopram (CELEXA) 20 MG tablet [id number] Take 1 tablet (20 mg total) by mouth daily. ALovenia Kim MD Taking  Active   CREON 12000 units CPEP capsule [id number] Yes take 1 capsule by mouth three times a day with meals Lovenia Kim, MD Taking Active Multiple Informants  ferrous sulfate 325 (65 FE) MG tablet [id number] Yes take 1 tablet by mouth three times a day with meals Bacigalupo, Dionne Bucy, MD Taking Active Multiple Informants           Med Note Darnelle Maffucci, Arville Lime   Fri Nov 21, 2017 11:05 AM) Taking 1 tab every other day  finasteride (PROSCAR) 5 MG tablet [id number] Yes Take 1 tablet (5 mg total) by mouth daily. Lovenia Kim, MD Taking Active    gabapentin (NEURONTIN) 100 MG capsule [id number] Yes Take 2 capsules (200 mg total) by mouth 3 (three) times daily. Lovenia Kim, MD Taking Active            Med Note Darnelle Maffucci, Arville Lime   Fri Nov 21, 2017 11:05 AM) Sometimes missing midday dose  lisinopril (PRINIVIL,ZESTRIL) 10 MG tablet [id number] No TAKE 1 TABLET BY MOUTH ONCE DAILY  Patient not taking:  Reported on 11/21/2017   Pixie Casino, MD Not Taking Active   pantoprazole (PROTONIX) 40 MG tablet [id number] No Take 1 tablet (40 mg total) by mouth daily.  Patient not taking:  Reported on 11/21/2017   Virginia Crews, MD Not Taking Active Multiple Informants  rivaroxaban (XARELTO) 20 MG TABS tablet [id number] Yes Take 1 tablet (20 mg total) by mouth daily with supper. Lovenia Kim, MD Taking Active   tamsulosin [facility]) 0.4 MG CAPS capsule [id number] Yes Take 1 capsule (0.4 mg total) by mouth daily. Lovenia Kim, MD Taking Active           Assessment:  Drugs sorted by system:  Neurologic/Psychologic: citalopram  Cardiovascular: amlodipine, atorvastatin, Xarelto  Gastrointestinal: Creon  Pain: gabapentin  Genitourinary: finasteride, tamsulosin  Vitamins/Minerals/Supplements: ferrous sulfate  Medication Review Findings:  . Lisinopril: Conflicting information per chart review regarding whether patient is to be on lisinopril or not; appropriate to get clarification from Dr. Debara Pickett  . Depressed mood/Pain management: Encouraged patient's daughter to schedule an appointment with Dr. Reesa Chew to discuss Mr. Jividen mood and pain management. Per Dr. Ericka Pontiff note from 02/2017, may be appropriate to consider SRNIs, such as duloxetine or venlafaxine, that may provide antidepressant and analgesic effect, if pain is determined to have a neuropathic component. Celebrex an inappropriate analgesic option d/t increased risk of GI bleed with concurrent Xarelto, as well as patient's cardiovascular hx.   . Gabapentin Dosing in Renal  Impairment: Gabapentin is recommended to be limited to BID dosing if CrCl <60, which patient is currently taking. Would avoid increasing to TID dosing, as patient may already be experiencing sedation from gabapentin that may be contributing to somnolence as noted above. Could consider changing to pregabalin therapy, which typically has a lower sedative effect. Pregabalin appears to be Tier 1 on his insurance plan   . Secondary prevention of ASCVD: per 2018 ACC/AHA lipid guidelines, patient qualifies as Very High Risk (hx CVA, PAD + age, DM, HTN, CKD), and may be appropriate for an LDL goal of <70. Consider an updated lipid panel, if LDL persistently >70, could increase atorvastatin to 80 mg daily  Plan: - Will route note to Dr. Debara Pickett for clarification regarding lisinopril intention - Will route note to PCP Dr. Reesa Chew for notification regarding daughter's thoughts on depressed mood/pain management - Per chart review, patient's daughter has scheduled him for an [facility] Access to Care appointment on Monday, 11/24/2017.  Catie Darnelle Maffucci, PharmD PGY2 Ambulatory Care Pharmacy Resident, [facility] Phone: [phone]

## 2017-11-21 NOTE — Addendum Note (Signed)
Addended by: Lourena Simmonds on: 11/21/2017 03:28 PM   Modules accepted: Orders

## 2017-11-24 ENCOUNTER — Ambulatory Visit (INDEPENDENT_AMBULATORY_CARE_PROVIDER_SITE_OTHER): Payer: Medicare Other | Admitting: Family Medicine

## 2017-11-24 ENCOUNTER — Other Ambulatory Visit: Payer: Self-pay

## 2017-11-24 DIAGNOSIS — M545 Low back pain: Secondary | ICD-10-CM

## 2017-11-24 DIAGNOSIS — G8929 Other chronic pain: Secondary | ICD-10-CM | POA: Diagnosis not present

## 2017-11-24 DIAGNOSIS — G5793 Unspecified mononeuropathy of bilateral lower limbs: Secondary | ICD-10-CM

## 2017-11-24 DIAGNOSIS — F329 Major depressive disorder, single episode, unspecified: Secondary | ICD-10-CM

## 2017-11-24 DIAGNOSIS — Z23 Encounter for immunization: Secondary | ICD-10-CM

## 2017-11-24 DIAGNOSIS — F32A Depression, unspecified: Secondary | ICD-10-CM

## 2017-11-24 MED ORDER — GABAPENTIN 400 MG PO CAPS: 400.0000 mg | ORAL_CAPSULE | Freq: Three times a day (TID) | ORAL | 1 refills | Status: DC

## 2017-11-24 MED ORDER — CITALOPRAM HYDROBROMIDE 40 MG PO TABS: 20.0000 mg | ORAL_TABLET | Freq: Every day | ORAL | 1 refills | Status: DC

## 2017-11-24 MED ORDER — CITALOPRAM HYDROBROMIDE 40 MG PO TABS: 40.0000 mg | ORAL_TABLET | Freq: Every day | ORAL | 1 refills | Status: DC

## 2017-11-24 NOTE — Patient Instructions (Signed)
It was nice seeing you again today.  You were seen in clinic for symptoms of worsening leg and lower back pain.  As we discussed, I am increasing your gabapentin to 400 mg 3 times a day.  Additionally, we talked about increasing your Celexa to 40 mg daily for depression.  I will follow-up with you in 4 to 6 weeks to see if you have any improvement with both of these issues.  Please call clinic if you have any questions.  Freddrick March MD

## 2017-11-24 NOTE — Progress Notes (Signed)
   Subjective:   Patient ID: Brian Burns    DOB: 09/26/1945, 72 y.o. male   MRN: [id number]  CC: back and leg pain, depression, health maintenance  HPI: Brian Burns is a 72 y.o. male who presents to clinic today for the following issues.  Leg and lower back pain Chronic, daughter states he has had recent worsening over the last 6-8 weeks.   He reports pain as 7/10 in his legs.   No new falls or injury.   Reports numbness and tingling into his legs. No loss of bowel or bladder control.    Depression Daughter reports since past 3 years has been on celexa 20 mg daily.   For some months now has been laying around the house more, eating habits have not changed.  He sleeps more than usual, gets out of the house occasionally throughout the day.  No SI or HI reported.  Health maintenance:  Pneumococcal and flu vaccines given today.   ROS: See HPI for pertinent ROS.  Social: Patient is a former smoker, quit 2001. Medications reviewed. Objective:   BP 92/62   Pulse 82   Temp 97.7 F (36.5 C) (Oral)   Ht 6\' 4"  (1.93 m)   Wt 228 lb 12.8 oz (103.8 kg)   SpO2 96%   BMI 27.85 kg/m  Vitals and nursing note reviewed.  General: well nourished, well developed, in no acute distress with non-toxic appearance HEENT: NCAT, EOMI, PERRL, MMM, oropharynx clear Neck: supple, non-tender, normal ROM, no LAD  CV: RRR no MRG  Lungs: CTAB, normal effort  Abdomen: soft, NTND, no masses or organomegaly, +bs  Skin: warm, dry, no rashes or lesions, cap refill < 2 seconds Extremities: warm and well perfused, normal tone Neuro: alert, oriented x3, no focal deficits   Assessment & Plan:   Neuropathic pain of both legs Likely related to lower back pathology.  No red flags on exam.  Rx: gabapentin increase to 400 mg TID  Return precautions discussed.   Depression Recent worsening per daughter.  He takes 20 mg Celexa daily with good compliance.  Daughter states he has been laying around the house  more and sleeping more than usual.  Denies any recent stressors. He seems unaware of the symptoms and feels things have not changed. Will trial increasing Celexa to 40 mg daily. Advised to follow-up in 4 to 6 weeks to see if symptoms have improved. Red flags reviewed.  Health maintenance:  Pneumococcal and flu vaccines given today.   Orders Placed This Encounter  Procedures  . Flu Vaccine QUAD 36+ mos IM  . Pneumococcal polysaccharide vaccine 23-valent greater than or equal to 2yo subcutaneous/IM   Meds ordered this encounter  Medications  . DISCONTD: citalopram (CELEXA) 40 MG tablet    Sig: Take 0.5 tablets (20 mg total) by mouth daily.    Dispense:  30 tablet    Refill:  1  . gabapentin (NEURONTIN) 400 MG capsule    Sig: Take 1 capsule (400 mg total) by mouth 3 (three) times daily.    Dispense:  90 capsule    Refill:  1  . DISCONTD: citalopram (CELEXA) 40 MG tablet    Sig: Take 1 tablet (40 mg total) by mouth daily.    Dispense:  30 tablet    Refill:  1    Freddrick March, MD [facility], PGY-3 11/30/2017 9:06 PM

## 2017-11-26 ENCOUNTER — Other Ambulatory Visit: Payer: Self-pay | Admitting: Family Medicine

## 2017-11-30 NOTE — Assessment & Plan Note (Addendum)
Recent worsening per daughter.  He takes 20 mg Celexa daily with good compliance.  Daughter states he has been laying around the house more and sleeping more than usual.  Denies any recent stressors. He seems unaware of the symptoms and feels things have not changed. Will trial increasing Celexa to 40 mg daily. Advised to follow-up in 4 to 6 weeks to see if symptoms have improved. Red flags reviewed.

## 2017-11-30 NOTE — Assessment & Plan Note (Signed)
Likely related to lower back pathology.  No red flags on exam.  Rx: gabapentin increase to 400 mg TID  Return precautions discussed.

## 2017-12-17 ENCOUNTER — Other Ambulatory Visit: Payer: Self-pay

## 2017-12-18 MED ORDER — PANCRELIPASE (LIP-PROT-AMYL) 12000-38000 UNITS PO CPEP: ORAL_CAPSULE | ORAL | 3 refills | Status: DC

## 2018-01-14 ENCOUNTER — Ambulatory Visit: Payer: Medicare Other | Admitting: Podiatry

## 2018-01-23 ENCOUNTER — Encounter: Payer: Self-pay | Admitting: Podiatry

## 2018-01-23 ENCOUNTER — Ambulatory Visit (INDEPENDENT_AMBULATORY_CARE_PROVIDER_SITE_OTHER): Payer: Medicare Other | Admitting: Podiatry

## 2018-01-23 DIAGNOSIS — B351 Tinea unguium: Secondary | ICD-10-CM | POA: Diagnosis not present

## 2018-01-23 DIAGNOSIS — M79675 Pain in left toe(s): Secondary | ICD-10-CM | POA: Diagnosis not present

## 2018-01-23 DIAGNOSIS — D689 Coagulation defect, unspecified: Secondary | ICD-10-CM | POA: Diagnosis not present

## 2018-01-23 DIAGNOSIS — M79674 Pain in right toe(s): Secondary | ICD-10-CM

## 2018-01-23 DIAGNOSIS — E1159 Type 2 diabetes mellitus with other circulatory complications: Secondary | ICD-10-CM | POA: Diagnosis not present

## 2018-01-23 NOTE — Progress Notes (Signed)
Patient ID: Brian Burns, male   DOB: 11/16/1945, 72 y.o.   MRN: [id number] Complaint:  Visit Type: Patient returns to my office for continued preventative foot care services. Complaint: Patient states" my nails have grown long and thick and become painful to walk and wear shoes" Patient has been diagnosed with borderline diabetic with PVD and peripheral neuropathy.. He presents for preventative foot care services. No changes to ROS.  Patient is taking coumadin. Podiatric Exam: Vascular: dorsalis pedis and posterior tibial pulses are palpable bilateral. Capillary return is immediate. Temperature gradient is WNL. Skin turgor WNL  Sensorium: Normal Semmes Weinstein monofilament test. Normal tactile sensation bilaterally. Nail Exam: Pt has thick disfigured discolored nails with subungual debris noted bilateral entire nail hallux through fifth toenails Ulcer Exam: There is no evidence of ulcer or pre-ulcerative changes or infection. Orthopedic Exam: Muscle tone and strength are WNL. No limitations in general ROM. No crepitus or effusions noted. Foot type and digits show no abnormalities. Bony prominences are unremarkable. Skin: No Porokeratosis. No infection or ulcers  Diagnosis:  Tinea unguium, Pain in right toe, pain in left toes  Treatment & Plan Procedures and Treatment: Consent by patient was obtained for treatment procedures. The patient understood the discussion of treatment and procedures well. All questions were answered thoroughly reviewed. Debridement of mycotic and hypertrophic toenails, 1 through 5 bilateral and clearing of subungual debris. No ulceration, no infection noted.  Return Visit-Office Procedure: Patient instructed to return to the office for a follow up visit 3 months for continued evaluation and treatment.  Donterrius Santucci DPM 

## 2018-02-05 ENCOUNTER — Other Ambulatory Visit: Payer: Self-pay

## 2018-02-09 MED ORDER — ATORVASTATIN CALCIUM 40 MG PO TABS: 40.0000 mg | ORAL_TABLET | Freq: Every day | ORAL | 1 refills | Status: DC

## 2018-02-09 MED ORDER — FINASTERIDE 5 MG PO TABS: 5.0000 mg | ORAL_TABLET | Freq: Every day | ORAL | 2 refills | Status: DC

## 2018-02-20 ENCOUNTER — Other Ambulatory Visit: Payer: Self-pay | Admitting: Family Medicine

## 2018-02-20 DIAGNOSIS — G8929 Other chronic pain: Secondary | ICD-10-CM

## 2018-02-20 DIAGNOSIS — M545 Low back pain: Principal | ICD-10-CM

## 2018-03-10 ENCOUNTER — Other Ambulatory Visit: Payer: Self-pay | Admitting: *Deleted

## 2018-03-10 MED ORDER — AMLODIPINE BESYLATE 10 MG PO TABS: 10.0000 mg | ORAL_TABLET | Freq: Every day | ORAL | 3 refills | Status: DC

## 2018-04-05 DIAGNOSIS — M48061 Spinal stenosis, lumbar region without neurogenic claudication: Secondary | ICD-10-CM | POA: Insufficient documentation

## 2018-04-08 ENCOUNTER — Other Ambulatory Visit: Payer: Self-pay

## 2018-04-08 ENCOUNTER — Ambulatory Visit (INDEPENDENT_AMBULATORY_CARE_PROVIDER_SITE_OTHER): Payer: Medicare Other | Admitting: Family Medicine

## 2018-04-08 DIAGNOSIS — M4807 Spinal stenosis, lumbosacral region: Secondary | ICD-10-CM

## 2018-04-08 DIAGNOSIS — M6281 Muscle weakness (generalized): Secondary | ICD-10-CM | POA: Insufficient documentation

## 2018-04-08 NOTE — Patient Instructions (Signed)
It was a pleasure to see you today! Thank you for choosing [facility] for your primary care. [name] was seen for a fall secondary to muscular weakness. Come back to the clinic if you have any more routine concerns, and go to the emergency room if you have any worsening of your symptoms prior to getting her MRI results.  Today we talked about your fall which did not seem to cause any significant lasting injury.  Of more concern to [place] is your story of progressing intermittent weakness in your legs over the last 2 months.  Coupled with occasional incontinence this is concerning for slowly progressing nerve problems in the spine so we are ordering an MRI quickly to try and evaluate this.  If the symptoms get worse you need to come to the emergency department immediately.    Please bring all your medications to every doctors visit   Sign up for My Chart to have easy access to your labs results, and communication with your Primary care physician.     Please check-out at the front desk before leaving the clinic.     Best,  Dr. Marthenia Rolling FAMILY MEDICINE RESIDENT - PGY2 04/08/2018 3:31 PM

## 2018-04-08 NOTE — Assessment & Plan Note (Signed)
Patient reports 55-month progressive intermittent weakness of his legs.  He says that they have given out on him and he has had trouble maintaining a steady gait and even had a fall yesterday with no serious injury related.  He said he has had some bowel incontinence while standing to urinate but denies a specific saddle anesthesia.  He denies any loss of consciousness changes in speech or vertigo symptoms.  There is a concern for progressive spinal stenosis or neurological impairment here, will order MRI outpatient with follow-up with PCP next week.  Patient has been instructed that if symptoms get worse he is supposed to go to the emergency department.

## 2018-04-11 ENCOUNTER — Ambulatory Visit (HOSPITAL_COMMUNITY)
Admission: RE | Admit: 2018-04-11 | Discharge: 2018-04-11 | Disposition: A | Discharge status: Home / Self Care | Payer: Medicare Other | Source: Ambulatory Visit | Attending: Family Medicine | Admitting: Family Medicine

## 2018-04-11 ENCOUNTER — Other Ambulatory Visit: Payer: Self-pay

## 2018-04-11 DIAGNOSIS — M4807 Spinal stenosis, lumbosacral region: Secondary | ICD-10-CM | POA: Diagnosis not present

## 2018-04-11 DIAGNOSIS — M545 Low back pain: Secondary | ICD-10-CM | POA: Diagnosis not present

## 2018-04-11 NOTE — Progress Notes (Signed)
    Subjective:  Brian Burns is a 73 y.o. male who presents to the [facility] today with a chief complaint of new muscle weakness.   HPI: Muscle weakness (generalized) Patient reports 50-month progressive intermittent weakness of his legs.  He says that they have given out on him and he has had trouble maintaining a steady gait and even had a fall yesterday with no serious injury related.  He said he has had some bowel incontinence while standing to urinate but denies a specific saddle anesthesia.  He denies any loss of consciousness changes in speech or vertigo symptoms.  There is a concern for progressive spinal stenosis or neurological impairment here, will order MRI outpatient with follow-up with PCP next week.  Patient has been instructed that if symptoms get worse he is supposed to go to the emergency department.  Objective:  Physical Exam: BP 110/80   Pulse 85   Temp (!) 97.2 F (36.2 C) (Oral)   Wt 229 lb (103.9 kg)   SpO2 94%   BMI 27.87 kg/m   Gen: NAD, conversing comfortably CV: RRR with no murmurs appreciated Pulm: NWOB, CTAB with no crackles, wheezes, or rhonchi MSK: no edema, cyanosis, or clubbing noted Skin: warm, dry Neuro: Sensation intact bilaterally for lower limbs, although he says this is an intermittent problem for him.  Muscle strength is still 5 of 5 bilaterally on both legs although asymmetrically weaker with left leg flexion at the knee.  Again he states that this is been an intermittent problem and he is not feeling those symptoms right now.  Gait is very slow and cautious, uses cane and relies on walls and furniture for extra stability. Psych: Normal affect and thought content  No results found for this or any previous visit (from the past 72 hour(s)).   Assessment/Plan:  Muscle weakness (generalized) Patient reports 2-month progressive intermittent weakness of his legs.  He says that they have given out on him and he has had trouble maintaining a steady gait  and even had a fall yesterday with no serious injury related.  He said he has had some bowel incontinence while standing to urinate but denies a specific saddle anesthesia.  He denies any loss of consciousness changes in speech or vertigo symptoms.  There is a concern for progressive spinal stenosis or neurological impairment here, will order MRI outpatient with follow-up with PCP next week.  Patient has been instructed that if symptoms get worse he is supposed to go to the emergency department.   Marthenia Rolling, DO FAMILY MEDICINE RESIDENT - PGY2 04/11/2018 10:24 AM

## 2018-04-15 ENCOUNTER — Encounter: Payer: Self-pay | Admitting: Family Medicine

## 2018-04-15 ENCOUNTER — Ambulatory Visit (INDEPENDENT_AMBULATORY_CARE_PROVIDER_SITE_OTHER): Payer: Medicare Other | Admitting: Family Medicine

## 2018-04-15 ENCOUNTER — Other Ambulatory Visit: Payer: Self-pay

## 2018-04-15 VITALS — BP 128/80 | HR 49 | Temp 97.7°F | Ht 76.0 in | Wt 230.8 lb

## 2018-04-15 DIAGNOSIS — M6281 Muscle weakness (generalized): Secondary | ICD-10-CM | POA: Diagnosis not present

## 2018-04-15 DIAGNOSIS — G8929 Other chronic pain: Secondary | ICD-10-CM

## 2018-04-15 DIAGNOSIS — M545 Low back pain, unspecified: Secondary | ICD-10-CM

## 2018-04-15 DIAGNOSIS — E119 Type 2 diabetes mellitus without complications: Secondary | ICD-10-CM

## 2018-04-15 LAB — POCT GLYCOSYLATED HEMOGLOBIN (HGB A1C): HBA1C, POC (CONTROLLED DIABETIC RANGE): 6.5 % (ref 0.0–7.0)

## 2018-04-15 MED ORDER — GABAPENTIN 300 MG PO CAPS: 600.0000 mg | ORAL_CAPSULE | Freq: Three times a day (TID) | ORAL | 2 refills | Status: DC

## 2018-04-15 NOTE — Patient Instructions (Signed)
It was nice seeing you again today.  You were seen in clinic for follow-up of your MRI results and for your handicap placard form.  As we discussed, your MRI did show several areas of narrowing which can compress on your spinal cord causing the symptoms you are experiencing.  I would recommend increasing your gabapentin to 600 mg 3 times a day.  Additionally, I have placed a referral to physical therapy for you.  You can expect a call within about a week or so regarding scheduling this appointment.  I would like to see you back in 1 to 2 months after you have had several sessions of physical therapy to make sure your symptoms are improving.  In the meantime, if you experience any loss of bowel or bladder function or numbness, these would be reasons to present to the ER.  Please call clinic if you have any questions.  Freddrick March MD

## 2018-04-15 NOTE — Progress Notes (Signed)
   Subjective:   Patient ID: Brian Burns    DOB: 17-Mar-1945, 73 y.o. male   MRN: [id number]  CC: discuss MRI results, handicap placard   HPI: Brian Burns is a 73 y.o. male who presents to clinic today for the following issue.  F/u muscle weakness  Previously seen by Dr. Parke Simmers on 04/08/18 for generalized muscle weakness in legs. Legs have been giving out, reports intermittent symptoms of this present over last 2.5 months and he sustained one fall during this time without significant injury.  He states he is not experiencing the weakness currently.  Denies numbness/tingling .  No saddle anesthesia or bowel/bladder incontinence reported. He has since not had any worsening or improvement in symptoms. Ambulates with a cane at home and has not had any additional falls since last visit.  MRI Lumbar spine ordered at that visit for concern for progressive spinal stenosis.  He has been taking gabapentin 400 mg TID with some mild relief in his symptoms. He presents today with his daughter to discuss his MRI results.   Handicap placard -pt with form to be filled for renewal   ROS: See HPI for pertinent ROS.  Social: former smoker, quit 2001 Medications reviewed. Objective:   BP 128/80   Pulse (!) 49   Temp 97.7 F (36.5 C) (Oral)   Ht 6\' 4"  (1.93 m)   Wt 230 lb 12.8 oz (104.7 kg)   SpO2 95%   BMI 28.09 kg/m  Vitals and nursing note reviewed.  General: 73 yo male, NAD  Neck: supple, non-tender, normal ROM, no LAD  CV: RRR no MRG  Lungs: CTAB, normal effort  Abdomen: soft, NTND, +bs  Skin: warm, dry, no rash  Extremities: warm and well perfused  Neuro: alert, oriented x3, no focal deficits, sensation intact in bilateral lower extremities, muscle strength 5/5 b/l in LE, gait is slow  Assessment & Plan:   Muscle weakness (generalized) History concerning for progression of spinal stenosis, however stable since last visit.  MRI lumbar spine results were reviewed today with him and his  daughter.  MRI showed L3-L4 spinal stenosis with 2 mm of anterolisthesis due to OA.  L4-L5 had endplate osteophytes and shallow protrusion of the disc. Asymmetric foraminal stenosis on the left because of encroachment by osteophyte and bulging disc material could affect the left L4 nerve.  No red flags on exam today and he is asymptomatic. Several options were discussed with him today including increasing his gabapentin for neuropathic pain and trial of physical therapy.  He and his daughter are agreeable to both at this time.  -Rx: gabapentin 600 mg TID  -Referral placed to physical therapy -ED precautions given  -F/u 1 month or sooner if symptoms worsen   Handicap placard -form filled and returned to patient at this visit  Orders Placed This Encounter  Procedures  . Ambulatory referral to Physical Therapy    Referral Priority:   Routine    Referral Type:   Physical Medicine    Referral Reason:   Specialty Services Required    Requested Specialty:   Physical Therapy    Number of Visits Requested:   1  . HgB A1c   Meds ordered this encounter  Medications  . gabapentin (NEURONTIN) 300 MG capsule    Sig: Take 2 capsules (600 mg total) by mouth 3 (three) times daily.    Dispense:  90 capsule    Refill:  2   Brian March, MD [facility] PGY-3

## 2018-04-15 NOTE — Assessment & Plan Note (Signed)
History concerning for progression of spinal stenosis, however stable since last visit.  MRI lumbar spine results were reviewed today with him and his daughter.  MRI showed L3-L4 spinal stenosis with 2 mm of anterolisthesis due to OA.  L4-L5 had endplate osteophytes and shallow protrusion of the disc. Asymmetric foraminal stenosis on the left because of encroachment by osteophyte and bulging disc material could affect the left L4 nerve.  No red flags on exam today and he is asymptomatic. Several options were discussed with him today including increasing his gabapentin for neuropathic pain and trial of physical therapy.  He and his daughter are agreeable to both at this time.  -Rx: gabapentin 600 mg TID  -Referral placed to physical therapy -ED precautions given  -F/u 1 month or sooner if symptoms worsen

## 2018-04-24 ENCOUNTER — Ambulatory Visit: Payer: Medicare Other | Admitting: Podiatry

## 2018-06-05 ENCOUNTER — Ambulatory Visit: Payer: Medicare Other | Admitting: Podiatry

## 2018-06-08 ENCOUNTER — Other Ambulatory Visit: Payer: Self-pay

## 2018-06-08 ENCOUNTER — Ambulatory Visit (INDEPENDENT_AMBULATORY_CARE_PROVIDER_SITE_OTHER): Payer: Medicare Other | Admitting: Podiatry

## 2018-06-08 ENCOUNTER — Encounter: Payer: Self-pay | Admitting: Podiatry

## 2018-06-08 VITALS — Temp 97.9°F

## 2018-06-08 DIAGNOSIS — M79674 Pain in right toe(s): Secondary | ICD-10-CM

## 2018-06-08 DIAGNOSIS — M79675 Pain in left toe(s): Secondary | ICD-10-CM

## 2018-06-08 DIAGNOSIS — Z9229 Personal history of other drug therapy: Secondary | ICD-10-CM

## 2018-06-08 DIAGNOSIS — B351 Tinea unguium: Secondary | ICD-10-CM

## 2018-06-08 NOTE — Patient Instructions (Signed)

## 2018-06-14 ENCOUNTER — Encounter: Payer: Self-pay | Admitting: Podiatry

## 2018-06-14 NOTE — Progress Notes (Signed)
Subjective: "My toenails have really grown out. They hurt."  Brian Burns is a 73 y.o. y.o. male who is on long term blood thinner Xarelto,  presents today with painful, discolored, thick toenails  which interfere with daily activities. Pain is aggravated when wearing enclosed shoe gear. Pain is relieved with periodic professional debridement.  Patient's PCP is Freddrick March, MD and last date seen was 04/15/2018.   Current Outpatient Medications:  .  acetaminophen-codeine (TYLENOL #3) 300-30 MG tablet, Take 1 tablet by mouth every 6 (six) hours as needed for severe pain., Disp: 15 tablet, Rfl: 0 .  amLODipine (NORVASC) 10 MG tablet, Take 1 tablet (10 mg total) by mouth daily., Disp: 90 tablet, Rfl: 3 .  atorvastatin (LIPITOR) 40 MG tablet, Take 1 tablet (40 mg total) by mouth daily., Disp: 90 tablet, Rfl: 1 .  citalopram (CELEXA) 40 MG tablet, TAKE 1 TABLET(40 MG) BY MOUTH DAILY, Disp: 90 tablet, Rfl: 1 .  ferrous sulfate 325 (65 FE) MG tablet, take 1 tablet by mouth three times a day with meals, Disp: 90 tablet, Rfl: 3 .  finasteride (PROSCAR) 5 MG tablet, Take 1 tablet (5 mg total) by mouth daily., Disp: 90 tablet, Rfl: 2 .  gabapentin (NEURONTIN) 300 MG capsule, Take 2 capsules (600 mg total) by mouth 3 (three) times daily., Disp: 90 capsule, Rfl: 2 .  lipase/protease/amylase (CREON) 12000 units CPEP capsule, take 1 capsule by mouth three times a day with meals, Disp: 270 capsule, Rfl: 3 .  pantoprazole (PROTONIX) 40 MG tablet, Take 1 tablet (40 mg total) by mouth daily., Disp: 30 tablet, Rfl: 0 .  rivaroxaban (XARELTO) 20 MG TABS tablet, Take 1 tablet (20 mg total) by mouth daily with supper., Disp: 90 tablet, Rfl: 3 .  tamsulosin (FLOMAX) 0.4 MG CAPS capsule, TAKE 1 CAPSULE(0.4 MG) BY MOUTH DAILY, Disp: 30 capsule, Rfl: 3   Allergies  Allergen Reactions  . Novocain [Procaine Hcl] Nausea And Vomiting  . Doxycycline Other (See Comments)    Reaction unknown     Objective: Vitals:   06/08/18 1012  Temp: 97.9 F (36.6 C)   Vascular Examination: Capillary refill time immediate x 10 digits.  Dorsalis pedis pulses palpable b/l.  Posterior tibial pulses palpable b/l.  No digital hair x 10 digits.  Skin temperature gradient WNL b/l.  Dermatological Examination: Skin with normla turgor, texture and tone b/l.  Toenails 1-5 b/l discolored, thick, dystrophic with subungual debris and pain with palpation to nailbeds due to thickness of nails.  Musculoskeletal: Muscle strength 5/5 to all LE muscle groups.  Bony prominences unremarkable.   Neurological: Sensation intact with 10 gram monofilament.  Vibratory sensation intact.  Assessment: Painful onychomycosis toenails 1-5 b/l in patient on blood thinner.   Plan: 1. Toenails 1-5 b/l were debrided in length and girth without iatrogenic bleeding. 2. Patient to continue soft, supportive shoe gear daily. 3. Patient to report any pedal injuries to medical professional immediately. 4. Avoid self trimming due to use of blood thinner. 5. Follow up 3 months.  6. Patient/POA to call should there be a concern in the interim.

## 2018-06-15 ENCOUNTER — Telehealth: Payer: Self-pay | Admitting: Internal Medicine

## 2018-06-16 ENCOUNTER — Encounter: Payer: Self-pay | Admitting: Internal Medicine

## 2018-06-16 ENCOUNTER — Telehealth (INDEPENDENT_AMBULATORY_CARE_PROVIDER_SITE_OTHER): Payer: Medicare Other | Admitting: Internal Medicine

## 2018-06-16 VITALS — Ht 76.0 in | Wt 236.0 lb

## 2018-06-16 DIAGNOSIS — I10 Essential (primary) hypertension: Secondary | ICD-10-CM | POA: Diagnosis not present

## 2018-06-16 DIAGNOSIS — I2510 Atherosclerotic heart disease of native coronary artery without angina pectoris: Secondary | ICD-10-CM

## 2018-06-16 DIAGNOSIS — I714 Abdominal aortic aneurysm, without rupture, unspecified: Secondary | ICD-10-CM

## 2018-06-16 DIAGNOSIS — I480 Paroxysmal atrial fibrillation: Secondary | ICD-10-CM | POA: Diagnosis not present

## 2018-06-16 DIAGNOSIS — I739 Peripheral vascular disease, unspecified: Secondary | ICD-10-CM

## 2018-06-16 DIAGNOSIS — I428 Other cardiomyopathies: Secondary | ICD-10-CM

## 2018-06-16 NOTE — Patient Instructions (Signed)
Medication Instructions:  Continue current medications If you need a refill on your cardiac medications before your next appointment, please call your pharmacy.   Lab work: NONE If you have labs (blood work) drawn today and your tests are completely normal, you will receive your results only by: . MyChart Message (if you have MyChart) OR . A paper copy in the mail If you have any lab test that is abnormal or we need to change your treatment, we will call you to review the results.  Testing/Procedures: NONE  Follow-Up: At [facility], you and your health needs are our priority.  As part of our continuing mission to provide you with exceptional heart care, we have created designated Provider Care Teams.  These Care Teams include your primary Cardiologist (physician) and Advanced Practice Providers (APPs -  Physician Assistants and Nurse Practitioners) who all work together to provide you with the care you need, when you need it. You will need a follow up appointment in 12 months.  Please call our office 2 months in advance to schedule this appointment.  You may see Dr. Hilty or one of the following Advanced Practice Providers on your designated Care Team: Hao Meng, PA-C . Angela Duke, PA-C  Any Other Special Instructions Will Be Listed Below (If Applicable).    

## 2018-06-16 NOTE — Progress Notes (Signed)
Virtual Visit via Telephone Note   This visit type was conducted due to national recommendations for restrictions regarding the COVID-19 Pandemic (e.g. social distancing) in an effort to limit this patient's exposure and mitigate transmission in our community.  Due to his co-morbid illnesses, this patient is at least at moderate risk for complications without adequate follow up.  This format is felt to be most appropriate for this patient at this time.  The patient did not have access to video technology/had technical difficulties with video requiring transitioning to audio format only (telephone).  All issues noted in this document were discussed and addressed.  No physical exam could be performed with this format.  Please refer to the patient's chart for his  consent to telehealth for [facility].   Evaluation Performed:  Telephone follow-up  Date:  06/16/2018   ID:  Leanord Burns, DOB [birth year], MRN [id number]  Patient Location:  [address] [place] [address]  Provider location:   [address] [place], [place] [address]  PCP:  Freddrick March, MD  Cardiologist:  No primary care provider on file. Electrophysiologist:  None   Chief Complaint:  No complaints  History of Present Illness:    Brian Burns is a 73 y.o. male who presents via audio/video conferencing for a telehealth visit today.  Mr. Pavlock was seen today for telephone follow-up.  He has a history of A. fib, abdominal aortic aneurysm, coronary artery disease, carotid artery disease, hypertension, dyslipidemia and other medical problems.  He did not seem very interested in talking with me over the phone today.  I asked him several questions and he gave rather short answers.  He said he denied any chest pain or worsening shortness of breath.  He really did not have any concerning issues.  The patient does not have symptoms concerning for COVID-19 infection (fever, chills, cough, or new SHORTNESS OF  BREATH).    Prior CV studies:   The following studies were reviewed today:  Chart review  PMHx:  Past Medical History:  Diagnosis Date  . AAA (abdominal aortic aneurysm) without rupture (HCC) 06/2014   no change in last exam from 2015  . Arthritis    knees,lower back  . Atrial fibrillation (HCC)   . Atrial flutter with rapid ventricular response (HCC) 01/16/2015  . CAD (coronary artery disease)    pt denies  . Carotid artery disease (HCC)   . Chronic back pain   . Dyspnea   . Erectile dysfunction   . GERD (gastroesophageal reflux disease)   . Headache    hx of  . History of CVA (cerebrovascular accident)    2009  &  2013  . HLD (hyperlipidemia)   . Hypertension   . Idiopathic pancreatitis 12/2014   acute  . Pre-diabetes    per pt. no meds  . PVD (peripheral vascular disease) with claudication [facility])     Past Surgical History:  Procedure Laterality Date  . back injections     [facility]  . CARDIOVASCULAR STRESS TEST  08-09-2009   mild global hypokinesis,  no ischemia or scar/  ef 41%  . CATARACT EXTRACTION W/ INTRAOCULAR LENS  IMPLANT, BILATERAL  april and may 2014  . CYSTOSCOPY N/A 01/17/2017   Procedure: CYSTOSCOPY FLEXIBLE;  Surgeon: Crist Fat, MD;  Location: [facility];  Service: Urology;  Laterality: N/A;  . CYSTOSCOPY WITH RETROGRADE URETHROGRAM N/A 11/24/2014   Procedure: CYSTOSCOPY WITH RETROGRADE URETHROGRAM;  Surgeon: Crist Fat,  MD;  Location: [facility];  Service: Urology;  Laterality: N/A;  . CYSTOSCOPY WITH URETHRAL DILATATION N/A 11/24/2014   Procedure: CYSTOSCOPY WITH URETHRAL DILATATION;  Surgeon: Crist Fat, MD;  Location: [facility];  Service: Urology;  Laterality: N/A;  BALLOON DILATION        . ESOPHAGOGASTRODUODENOSCOPY N/A 05/15/2014   Procedure: ESOPHAGOGASTRODUODENOSCOPY (EGD);  Surgeon: Carman Ching, MD;  Location: [facility];  Service: Endoscopy;  Laterality: N/A;  .  ESOPHAGOGASTRODUODENOSCOPY Left 02/02/2015   Procedure: ESOPHAGOGASTRODUODENOSCOPY (EGD);  Surgeon: Dorena Cookey, MD;  Location: [facility];  Service: Endoscopy;  Laterality: Left;  . ESOPHAGOGASTRODUODENOSCOPY Left 03/16/2015   Procedure: ESOPHAGOGASTRODUODENOSCOPY (EGD);  Surgeon: Dorena Cookey, MD;  Location: [facility];  Service: Endoscopy;  Laterality: Left;  . TRANSTHORACIC ECHOCARDIOGRAM  01-07-2012   mild to moderate dilated LV,  ef 45-50%,  mild basal hypokinesis,  grade I diastolic  dysfunction/  trivial AR/  mild MR  . URETHROPLASTY N/A 01/17/2017   Procedure: URETHEROPLASTY BUCCAL GRAFT AND BUCCAL MUCOSA GRAFT HARVEST;  Surgeon: Crist Fat, MD;  Location: [facility];  Service: Urology;  Laterality: N/A;    FAMHx:  Family History  Problem Relation Age of Onset  . Lung disease Brother   . Kidney disease Brother   . Heart disease Brother        died at 56  . Heart disease Mother        died at 2  . Kidney disease Mother   . Lung disease Mother     SOCHx:   reports that he quit smoking about 19 years ago. His smoking use included cigarettes. He has a 20.00 pack-year smoking history. He has never used smokeless tobacco. He reports that he does not drink alcohol or use drugs.  ALLERGIES:  Allergies  Allergen Reactions  . Novocain [Procaine Hcl] Nausea And Vomiting  . Doxycycline Other (See Comments)    Reaction unknown    MEDS:  Current Meds  Medication Sig  . acetaminophen-codeine (TYLENOL #3) 300-30 MG tablet Take 1 tablet by mouth every 6 (six) hours as needed for severe pain.  Marland Kitchen amLODipine (NORVASC) 10 MG tablet Take 1 tablet (10 mg total) by mouth daily.  Marland Kitchen atorvastatin (LIPITOR) 40 MG tablet Take 1 tablet (40 mg total) by mouth daily.  . citalopram (CELEXA) 40 MG tablet TAKE 1 TABLET(40 MG) BY MOUTH DAILY  . ferrous sulfate 325 (65 FE) MG tablet take 1 tablet by mouth three times a day with meals  . finasteride (PROSCAR) 5 MG tablet Take 1 tablet (5 mg total)  by mouth daily.  Marland Kitchen gabapentin (NEURONTIN) 300 MG capsule Take 2 capsules (600 mg total) by mouth 3 (three) times daily.  . lipase/protease/amylase (CREON) 12000 units CPEP capsule take 1 capsule by mouth three times a day with meals  . pantoprazole (PROTONIX) 40 MG tablet Take 1 tablet (40 mg total) by mouth daily.  . rivaroxaban (XARELTO) 20 MG TABS tablet Take 1 tablet (20 mg total) by mouth daily with supper.  . tamsulosin (FLOMAX) 0.4 MG CAPS capsule TAKE 1 CAPSULE(0.4 MG) BY MOUTH DAILY     ROS: Pertinent items noted in HPI and remainder of comprehensive ROS otherwise negative.  Labs/Other Tests and Data Reviewed:    Recent Labs: No results found for requested labs within last 8760 hours.   Recent Lipid Panel Lab Results  Component Value Date/Time   CHOL 167 07/20/2015 03:02 PM   TRIG 129 07/20/2015 03:02 PM  HDL 51 07/20/2015 03:02 PM   CHOLHDL 3.3 07/20/2015 03:02 PM   LDLCALC 90 07/20/2015 03:02 PM   LDLDIRECT 175 (H) 04/09/2011 11:22 AM    Wt Readings from Last 3 Encounters:  06/16/18 236 lb (107 kg)  04/15/18 230 lb 12.8 oz (104.7 kg)  04/08/18 229 lb (103.9 kg)     Exam:    Vital Signs:  Ht 6\' 4"  (1.93 m)   Wt 236 lb (107 kg)   BMI 28.73 kg/m    Exam not performed due to telephone visit  ASSESSMENT & PLAN:    1. Low preoperative risk - low risk myoview, with EF 46%, 50-55% by echo 2. Exertional chest pressure - resolved 3. Dyslipidemia 4. PAD- AAA (followed by vascular surgery) 5. PAF on warfarin (CHADSVASC score of 6) 6. Asymptomatic bradycardia  Mr. Azucena CecilBurton had no significant concerns today.  Denies any chest pain or worsening shortness of breath.  His cholesterol has been fairly well treated.  Weight is up several pounds.  His AAA is followed by vascular surgery.  He denies any concerns with his A. fib.  Denies any bleeding problems.  His INR is checked regularly.  COVID-19 Education: The signs and symptoms of COVID-19 were discussed with the  patient and how to seek care for testing (follow up with PCP or arrange E-visit).  The importance of social distancing was discussed today.  Patient Risk:   After full review of this patients clinical status, I feel that they are at least moderate risk at this time.  Time:   Today, I have spent 25 minutes with the patient with telehealth technology discussing dyslipidemia, hypertension, abdominal aneurysm, PAD, CAD, atrial fibrillation.     Medication Adjustments/Labs and Tests Ordered: Current medicines are reviewed at length with the patient today.  Concerns regarding medicines are outlined above.   Tests Ordered: No orders of the defined types were placed in this encounter.   Medication Changes: No orders of the defined types were placed in this encounter.   Disposition:  in 1 year(s)  Chrystie NoseKenneth C. Mackensi Mahadeo, MD, [facility] OntarioFACC, FACP  [facility]  Medical Director of the [facility] Lipid [facility]  Cardiovascular Risk Reduction [facility] Diplomate of the American Board of Clinical Lipidology Attending Cardiologist  Direct Dial: [phone]  Fax: [phone]  Website:  [email or web address]  Chrystie NoseKenneth C Tinya Cadogan, MD  06/16/2018 5:30 PM

## 2018-06-17 ENCOUNTER — Other Ambulatory Visit: Payer: Self-pay

## 2018-06-17 ENCOUNTER — Encounter: Payer: Self-pay | Admitting: Family Medicine

## 2018-06-17 ENCOUNTER — Ambulatory Visit (INDEPENDENT_AMBULATORY_CARE_PROVIDER_SITE_OTHER): Payer: Medicare Other | Admitting: Family Medicine

## 2018-06-17 VITALS — Ht 76.0 in | Wt 230.4 lb

## 2018-06-17 DIAGNOSIS — Z0000 Encounter for general adult medical examination without abnormal findings: Secondary | ICD-10-CM

## 2018-06-17 NOTE — Progress Notes (Addendum)
Subjective:   Brian Burns is a 73 y.o. male who presents for Medicare Annual/Subsequent preventive examination.  The patient consented to a virtual visit.   Review of Systems:  No fever, cough, chills, sore throat or shortness of breath.  No chest pain or leg swelling.   Cardiac Risk Factors include: advanced age (>70men, >36 women);dyslipidemia;hypertension;male gender;sedentary lifestyle     Objective:    Vitals: Ht 6\' 4"  (1.93 m)   Wt 230 lb 6.4 oz (104.5 kg)   BMI 28.05 kg/m   Body mass index is 28.05 kg/m.  Advanced Directives 04/15/2018 04/08/2018 08/25/2017 06/13/2017 04/03/2017 03/07/2017 01/17/2017  Does Patient Have a Medical Advance Directive? No No No No No No No  Type of Advance Directive - - - - - - -  Does patient want to make changes to medical advance directive? - - - - - - -  Copy of Healthcare Power of Attorney in Chart? - - - - - - -  Would patient like information on creating a medical advance directive? No - Patient declined No - Patient declined No - Patient declined No - Patient declined No - Patient declined No - Patient declined No - Patient declined  Pre-existing out of facility DNR order (yellow form or pink MOST form) - - - - - - -    Tobacco Social History   Tobacco Use  Smoking Status Former Smoker  . Packs/day: 0.50  . Years: 40.00  . Pack years: 20.00  . Types: Cigarettes  . Last attempt to quit: 01/29/1999  . Years since quitting: 19.3  Smokeless Tobacco Never Used     Counseling given: Not Answered   Clinical Intake:  Pre-visit preparation completed: Yes        Diabetes: No  How often do you need to have someone help you when you read instructions, pamphlets, or other written materials from your doctor or pharmacy?: 1 - Never What is the last grade level you completed in school?: 7th  Interpreter Needed?: No     Past Medical History:  Diagnosis Date  . AAA (abdominal aortic aneurysm) without rupture (HCC) 06/2014   no  change in last exam from 2015  . Arthritis    knees,lower back  . Atrial fibrillation (HCC)   . Atrial flutter with rapid ventricular response (HCC) 01/16/2015  . CAD (coronary artery disease)    pt denies  . Carotid artery disease (HCC)   . Chronic back pain   . Dyspnea   . Erectile dysfunction   . GERD (gastroesophageal reflux disease)   . Headache    hx of  . History of CVA (cerebrovascular accident)    2009  &  2013  . HLD (hyperlipidemia)   . Hypertension   . Idiopathic pancreatitis 12/2014   acute  . Pre-diabetes    per pt. no meds  . PVD (peripheral vascular disease) with claudication [facility])    Past Surgical History:  Procedure Laterality Date  . back injections     [facility]  . CARDIOVASCULAR STRESS TEST  08-09-2009   mild global hypokinesis,  no ischemia or scar/  ef 41%  . CATARACT EXTRACTION W/ INTRAOCULAR LENS  IMPLANT, BILATERAL  april and may 2014  . CYSTOSCOPY N/A 01/17/2017   Procedure: CYSTOSCOPY FLEXIBLE;  Surgeon: Crist Fat, MD;  Location: [facility];  Service: Urology;  Laterality: N/A;  . CYSTOSCOPY WITH RETROGRADE URETHROGRAM N/A 11/24/2014   Procedure: CYSTOSCOPY WITH RETROGRADE URETHROGRAM;  Surgeon: Sharlet Salina  Lou Miner, MD;  Location: [facility];  Service: Urology;  Laterality: N/A;  . CYSTOSCOPY WITH URETHRAL DILATATION N/A 11/24/2014   Procedure: CYSTOSCOPY WITH URETHRAL DILATATION;  Surgeon: Crist Fat, MD;  Location: [facility];  Service: Urology;  Laterality: N/A;  BALLOON DILATION        . ESOPHAGOGASTRODUODENOSCOPY N/A 05/15/2014   Procedure: ESOPHAGOGASTRODUODENOSCOPY (EGD);  Surgeon: Carman Ching, MD;  Location: [facility];  Service: Endoscopy;  Laterality: N/A;  . ESOPHAGOGASTRODUODENOSCOPY Left 02/02/2015   Procedure: ESOPHAGOGASTRODUODENOSCOPY (EGD);  Surgeon: Dorena Cookey, MD;  Location: [facility];  Service: Endoscopy;  Laterality: Left;  . ESOPHAGOGASTRODUODENOSCOPY Left  03/16/2015   Procedure: ESOPHAGOGASTRODUODENOSCOPY (EGD);  Surgeon: Dorena Cookey, MD;  Location: [facility];  Service: Endoscopy;  Laterality: Left;  . TRANSTHORACIC ECHOCARDIOGRAM  01-07-2012   mild to moderate dilated LV,  ef 45-50%,  mild basal hypokinesis,  grade I diastolic  dysfunction/  trivial AR/  mild MR  . URETHROPLASTY N/A 01/17/2017   Procedure: URETHEROPLASTY BUCCAL GRAFT AND BUCCAL MUCOSA GRAFT HARVEST;  Surgeon: Crist Fat, MD;  Location: [facility];  Service: Urology;  Laterality: N/A;   Family History  Problem Relation Age of Onset  . Lung disease Brother   . Kidney disease Brother   . Heart disease Brother        died at 81  . Heart disease Mother        died at 79  . Kidney disease Mother   . Lung disease Mother    Social History   Socioeconomic History  . Marital status: Married    Spouse name: Brian Burns  . Number of children: 2   . Years of education: 7  . Highest education level: 7th grade  Occupational History  . 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 Occupation: [employer]    Comment: on the weekends  Social Needs  . Financial resource strain: Not very hard  . Food insecurity:    Worry: Never true    Inability: Never true  . Transportation needs:    Medical: No    Non-medical: No  Tobacco Use  . Smoking status: Former Smoker    Packs/day: 0.50    Years: 40.00    Pack years: 20.00    Types: Cigarettes    Last attempt to quit: 01/29/1999    Years since quitting: 19.3  . Smokeless tobacco: Never Used  Substance and Sexual Activity  . Alcohol use: No    Alcohol/week: 0.0 standard drinks    Frequency: Never    Comment: hx of quit in 2006  . Drug use: Never  . Sexual activity: Not Currently  Lifestyle  . Physical activity:    Days per week: Not on file    Minutes per session: Not on file  . Stress: Not on file  Relationships  . Social connections:    Talks on phone: Not on file    Gets together: Not  on file    Attends religious service: Not on file    Active member of club or organization: Not on file    Attends meetings of clubs or organizations: Not on file    Relationship status: Not on file  Other Topics Concern  . Not on file  Social History Narrative  . Not on file    Outpatient Encounter Medications as of 06/17/2018  Medication Sig  . amLODipine (NORVASC) 10 MG tablet Take 1 tablet (10  mg total) by mouth daily.  Marland Kitchen. atorvastatin (LIPITOR) 40 MG tablet Take 1 tablet (40 mg total) by mouth daily.  . citalopram (CELEXA) 40 MG tablet TAKE 1 TABLET(40 MG) BY MOUTH DAILY  . ferrous sulfate 325 (65 FE) MG tablet take 1 tablet by mouth three times a day with meals  . finasteride (PROSCAR) 5 MG tablet Take 1 tablet (5 mg total) by mouth daily.  Marland Kitchen. gabapentin (NEURONTIN) 300 MG capsule Take 2 capsules (600 mg total) by mouth 3 (three) times daily.  . lipase/protease/amylase (CREON) 12000 units CPEP capsule take 1 capsule by mouth three times a day with meals  . pantoprazole (PROTONIX) 40 MG tablet Take 1 tablet (40 mg total) by mouth daily.  . rivaroxaban (XARELTO) 20 MG TABS tablet Take 1 tablet (20 mg total) by mouth daily with supper.  . tamsulosin (FLOMAX) 0.4 MG CAPS capsule TAKE 1 CAPSULE(0.4 MG) BY MOUTH DAILY  . acetaminophen-codeine (TYLENOL #3) 300-30 MG tablet Take 1 tablet by mouth every 6 (six) hours as needed for severe pain. (Patient not taking: Reported on 06/17/2018)   No facility-administered encounter medications on file as of 06/17/2018.     Activities of Daily Living In your present state of health, do you have any difficulty performing the following activities: 06/17/2018  Hearing? Y  Comment very hard of hearing  Vision? N  Difficulty concentrating or making decisions? N  Walking or climbing stairs? Y  Comment ambulates with cane  Dressing or bathing? N  Doing errands, shopping? Y  Comment daughter brings him to visits  Preparing Food and eating ? N  Using  the Toilet? N  In the past six months, have you accidently leaked urine? Y  Comment has urinary incontinence, wears depends  Do you have problems with loss of bowel control? N  Managing your Medications? Y  Comment daughter manages meds  Managing your Finances? Y  Comment daughter Neurosurgeonmanages finances  Housekeeping or managing your Housekeeping? N  Some recent data might be hidden    Patient Care Team: Freddrick MarchAmin, Brook Geraci, MD as PCP - General Fields, Janetta Horaharles E, MD as Consulting Physician (Vascular Surgery) Helane GuntherMayer, Gregory, DPM as Consulting Physician (Podiatry) Mateo FlowHecker, Kathryn, MD as Consulting Physician (Ophthalmology) Crist FatHerrick, Benjamin W, MD as Attending Physician (Urology) Rennis GoldenHilty, Lisette AbuKenneth C, MD as Consulting Physician (Cardiology)   Assessment:   This is a routine wellness examination [facility].  Exercise Activities and Dietary recommendations Current Exercise Habits: The patient does not participate in regular exercise at present, Exercise limited by: orthopedic condition(s)  Goals    . Blood Pressure < 140/90    . Exercise 150 minutes per week (moderate activity)     With cane in 10-15 minute increments    . HEMOGLOBIN A1C < 7.0       Fall Risk Fall Risk  06/17/2018 04/15/2018 04/08/2018 11/24/2017 08/25/2017  Falls in the past year? 1 1 1  No No  Number falls in past yr: 1 1 1  - -  Injury with Fall? 0 0 0 - -  Risk for fall due to : History of fall(s) - - - -  Follow up Education provided;Falls prevention discussed - - - -   Is the patient's home free of loose throw rugs in walkways, pet beds, electrical cords, etc?   yes      Grab bars in the bathroom? no      Handrails on the stairs?   yes      Adequate lighting?   yes  Patient rating of health (0-10): 8   Depression Screen PHQ 2/9 Scores 06/17/2018 04/15/2018 04/08/2018 11/24/2017  PHQ - 2 Score 1 0 0 0    Cognitive Function     6CIT Screen 06/17/2018  What Year? 0 points  What month? 0 points  What time? 0 points   Count back from 20 0 points  Months in reverse 2 points  Repeat phrase 2 points  Total Score 4    Immunization History  Administered Date(s) Administered  . Influenza,inj,Quad PF,6+ Mos 02/16/2013, 10/21/2013, 01/16/2015, 10/09/2015, 11/24/2017  . Pneumococcal Conjugate-13 07/20/2015  . Pneumococcal Polysaccharide-23 11/24/2017  . Td 05/29/2002  . Zoster Recombinat (Shingrix) 08/28/2016    Qualifies for Shingles Vaccine? Patient reports he has completed in 02/2018.  Screening Tests Health Maintenance  Topic Date Due  . TETANUS/TDAP  05/28/2012  . COLONOSCOPY  10/28/2012  . OPHTHALMOLOGY EXAM  07/29/2014  . LIPID PANEL  01/19/2016  . FOOT EXAM  07/19/2016  . URINE MICROALBUMIN  07/19/2016  . INFLUENZA VACCINE  08/29/2018  . HEMOGLOBIN A1C  10/16/2018  . Hepatitis C Screening  Completed  . PNA vac Low Risk Adult  Completed   Cancer Screenings: Lung: Low Dose CT Chest recommended if Age 49-80 years, 30 pack-year currently smoking OR have quit w/in 15years. Patient does qualify.  Colorectal: due for colonoscopy   Additional Screenings:  Hepatitis C Screening: not completed    Plan:     Upcoming Health Maintenance discussed:  -due for colonoscopy -low dose CT scan for h/o tobacco use  -Hepatitis C screening   He and his daughter are agreeable to follow up in the next 1-2 months to address these.   I have personally reviewed and noted the following in the patient's chart:   . Medical and social history . Use of alcohol, tobacco or illicit drugs  . Current medications and supplements . Functional ability and status . Nutritional status . Physical activity . Advanced directives . List of other physicians . Hospitalizations, surgeries, and ER visits in previous 12 months . Vitals . Screenings to include cognitive, depression, and falls . Referrals and appointments  In addition, I have reviewed and discussed with patient certain preventive protocols, quality  metrics, and best practice recommendations. A written personalized care plan for preventive services as well as general preventive health recommendations were provided to patient.    This visit was conducted virtually in the setting of the COVID19 pandemic.    Freddrick March, MD  06/17/2018

## 2018-06-17 NOTE — Patient Instructions (Addendum)
You spoke to Freddrick March, MD over the phone for your annual wellness visit.  We discussed goals:  Goals    . Blood Pressure < 140/90    . Exercise 150 minutes per week (moderate activity)     With cane in 10-15 minute increments    . HEMOGLOBIN A1C < 7.0      We also discussed recommended health maintenance. Please call our office and schedule a visit. As discussed, you are due for: Health Maintenance  Topic Date Due  . TETANUS/TDAP  05/28/2012  . COLONOSCOPY  10/28/2012  . OPHTHALMOLOGY EXAM  07/29/2014  . LIPID PANEL  01/19/2016  . FOOT EXAM  07/19/2016  . URINE MICROALBUMIN  07/19/2016  . INFLUENZA VACCINE  08/29/2018  . HEMOGLOBIN A1C  10/16/2018  . Hepatitis C Screening  Completed  . PNA vac Low Risk Adult  Completed   We discussed several upcoming health maintenance issues including a colonoscopy, a CT scan to screen for lung cancer given your history of tobacco use, and hepatitis C screening.  You may follow up in 1-2 months for these issues.  We also discussed your referral to physical therapy.  An order for referral was placed at your previous visit and I will discuss with our referral coordinator to follow up on that for you.    Our clinic's number is [phone]. Please call with questions or concerns about what we discussed today.

## 2018-06-19 ENCOUNTER — Other Ambulatory Visit: Payer: Self-pay | Admitting: Family Medicine

## 2018-06-19 DIAGNOSIS — G8929 Other chronic pain: Secondary | ICD-10-CM

## 2018-07-12 ENCOUNTER — Other Ambulatory Visit: Payer: Self-pay | Admitting: Family Medicine

## 2018-07-17 ENCOUNTER — Other Ambulatory Visit: Payer: Self-pay | Admitting: Family Medicine

## 2018-07-28 NOTE — Telephone Encounter (Signed)
Opened in error

## 2018-08-07 ENCOUNTER — Ambulatory Visit: Payer: Medicare Other | Attending: Family Medicine | Admitting: Physical Therapy

## 2018-08-07 ENCOUNTER — Other Ambulatory Visit: Payer: Self-pay

## 2018-08-07 DIAGNOSIS — M545 Low back pain, unspecified: Secondary | ICD-10-CM

## 2018-08-07 DIAGNOSIS — R2689 Other abnormalities of gait and mobility: Secondary | ICD-10-CM | POA: Diagnosis not present

## 2018-08-07 DIAGNOSIS — M6281 Muscle weakness (generalized): Secondary | ICD-10-CM | POA: Diagnosis not present

## 2018-08-07 DIAGNOSIS — G8929 Other chronic pain: Secondary | ICD-10-CM | POA: Diagnosis not present

## 2018-08-07 NOTE — Patient Instructions (Signed)
Access Code: [email or web address]  URL: [email or web address]  Date: 08/07/2018  Prepared by: Elsie Ra   Exercises  Supine Hamstring Stretch with Strap - 3 reps - 1 sets - 30 hold - 2x daily - 6x weekly  Supine Single Knee to Chest - 3 sets - 30 hold - 2x daily - 6x weekly  Supine March - 10 reps - 1-2 sets - 2x daily - 6x weekly  Seated Lumbar Flexion Stretch - 10-20 reps - 1 sets - 5 hold - 2x daily - 6x weekly  Sit to Stand with Armchair - 10 reps - 1-2 sets - 2x daily - 6x weekly

## 2018-08-08 ENCOUNTER — Encounter: Payer: Self-pay | Admitting: Physical Therapy

## 2018-08-08 NOTE — Therapy (Signed)
[facility] [address] [place], [place], [address] Phone: [phone]   Fax:  [phone]  Physical Therapy Evaluation  Patient Details  Name: Brian Burns MRN: [id number] Date of Birth: August 19, 1945 Referring Provider (PT): Doreene Eland, MD resident, Freddrick March, MD   Encounter Date: 08/07/2018  PT End of Session - 08/08/18 2222    Visit Number  1    Number of Visits  12    Date for PT Re-Evaluation  09/19/18    Authorization Type  [employer] MCR/MCD    PT Start Time  1040    PT Stop Time  1120    PT Time Calculation (min)  40 min    Activity Tolerance  Patient tolerated treatment well    Behavior During Therapy  [facility] for tasks assessed/performed       Past Medical History:  Diagnosis Date  . AAA (abdominal aortic aneurysm) without rupture (HCC) 06/2014   no change in last exam from 2015  . Arthritis    knees,lower back  . Atrial fibrillation (HCC)   . Atrial flutter with rapid ventricular response (HCC) 01/16/2015  . CAD (coronary artery disease)    pt denies  . Carotid artery disease (HCC)   . Chronic back pain   . Dyspnea   . Erectile dysfunction   . GERD (gastroesophageal reflux disease)   . Headache    hx of  . History of CVA (cerebrovascular accident)    2009  &  2013  . HLD (hyperlipidemia)   . Hypertension   . Idiopathic pancreatitis 12/2014   acute  . Pre-diabetes    per pt. no meds  . PVD (peripheral vascular disease) with claudication [facility])     Past Surgical History:  Procedure Laterality Date  . back injections     [facility]  . CARDIOVASCULAR STRESS TEST  08-09-2009   mild global hypokinesis,  no ischemia or scar/  ef 41%  . CATARACT EXTRACTION W/ INTRAOCULAR LENS  IMPLANT, BILATERAL  april and may 2014  . CYSTOSCOPY N/A 01/17/2017   Procedure: CYSTOSCOPY FLEXIBLE;  Surgeon: Crist Fat, MD;  Location: [facility];  Service: Urology;  Laterality: N/A;  . CYSTOSCOPY WITH  RETROGRADE URETHROGRAM N/A 11/24/2014   Procedure: CYSTOSCOPY WITH RETROGRADE URETHROGRAM;  Surgeon: Crist Fat, MD;  Location: [facility];  Service: Urology;  Laterality: N/A;  . CYSTOSCOPY WITH URETHRAL DILATATION N/A 11/24/2014   Procedure: CYSTOSCOPY WITH URETHRAL DILATATION;  Surgeon: Crist Fat, MD;  Location: [facility];  Service: Urology;  Laterality: N/A;  BALLOON DILATION        . ESOPHAGOGASTRODUODENOSCOPY N/A 05/15/2014   Procedure: ESOPHAGOGASTRODUODENOSCOPY (EGD);  Surgeon: Carman Ching, MD;  Location: [facility] ENDOSCOPY;  Service: Endoscopy;  Laterality: N/A;  . ESOPHAGOGASTRODUODENOSCOPY Left 02/02/2015   Procedure: ESOPHAGOGASTRODUODENOSCOPY (EGD);  Surgeon: Dorena Cookey, MD;  Location: [facility] ENDOSCOPY;  Service: Endoscopy;  Laterality: Left;  . ESOPHAGOGASTRODUODENOSCOPY Left 03/16/2015   Procedure: ESOPHAGOGASTRODUODENOSCOPY (EGD);  Surgeon: Dorena Cookey, MD;  Location: [facility];  Service: Endoscopy;  Laterality: Left;  . TRANSTHORACIC ECHOCARDIOGRAM  01-07-2012   mild to moderate dilated LV,  ef 45-50%,  mild basal hypokinesis,  grade I diastolic  dysfunction/  trivial AR/  mild MR  . URETHROPLASTY N/A 01/17/2017   Procedure: URETHEROPLASTY BUCCAL GRAFT AND BUCCAL MUCOSA GRAFT HARVEST;  Surgeon: Crist Fat, MD;  Location: [facility];  Service: Urology;  Laterality: N/A;    There were no vitals filed  for this visit.   Subjective Assessment - 08/08/18 2234    Subjective  he reports chronic LBP and leg weakness over last 5 years. He has had injections with no relief. [facility]. He has pain and difficulty walking, standing, prolonged sitting. He can now walk grocery store distances and his wife or daughter does this for him.    Pertinent History  PMH:AAA, Afib, CAAD, chronic LBP, GERD, CA 09 and 2013, HTN, PVD    Limitations  Sitting;Lifting;Standing;Walking;House hold activities    How long can you sit  comfortably?  10    How long can you stand comfortably?  5 min    How long can you walk comfortably?  50 ft    Diagnostic tests  MRI showed L3-L4 spinal stenosis with 2 mm of anterolisthesis due to OA.  L4-L5 had endplate osteophytes and shallow protrusion of the disc. Asymmetric foraminal stenosis on the left because of encroachment by osteophyte and bulging disc material could affect the left L4 nerve    Pain Score  7     Pain Location  Back    Pain Orientation  Lower    Pain Descriptors / Indicators  Aching;Tightness    Pain Type  Chronic pain    Pain Onset  More than a month ago    Pain Frequency  Constant    Multiple Pain Sites  No         [facility] PT Assessment - 08/08/18 0001      Assessment   Medical Diagnosis  Chronic bilateral low back pain, DDD,,spinal stenosis with anterolisthesis L3-4,  generalized leg weakness.    Referring Provider (PT)  Doreene ElandEniola, Kehinde T, MD resident, Freddrick MarchAmin, Yashika, MD    Onset Date/Surgical Date  --   Chronic LBP for 5 years   Hand Dominance  Right      Precautions   Precautions  Fall      Balance Screen   Has the patient fallen in the past 6 months  Yes    How many times?  3    Has the patient had a decrease in activity level because of a fear of falling?   Yes    Is the patient reluctant to leave their home because of a fear of falling?   Yes      Home Environment   Living Environment  Private residence    Living Arrangements  Spouse/significant other      Prior Function   Level of Independence  --   needs help with bathing, houskeeping, groceries,    Vocation  Retired    Leisure  Advertising account [employer]watch [employer]      Cognition   Overall Cognitive Status  Within Functional Limits for tasks assessed      Observation/Other Assessments   Focus on Therapeutic Outcomes (FOTO)   72% limited      Sensation   Light Touch  Appears Intact      Posture/Postural Control   Posture Comments  flexed slumped posture      ROM / Strength   AROM / PROM / Strength   AROM;Strength      AROM   AROM Assessment Site  Lumbar    Lumbar Flexion  WNL    Lumbar Extension  --   to neutral only   Lumbar - Right Side Bend  50%    Lumbar - Left Side Bend  50%    Lumbar - Right Rotation  25%  Lumbar - Left Rotation  25%      Strength   Overall Strength Comments  knee strength 4+/5 MMT bilat, hip strength grossly 4/5 MMT tested in sitting      Flexibility   Soft Tissue Assessment /Muscle Length  --   very tight hamstrings, hip flexors     Palpation   Spinal mobility  decreased      Special Tests   Other special tests  Neg SLR      Transfers   Comments  Independent but difficutly and increased time needed for transfers      Ambulation/Gait   Gait Comments  slow speed, unsteady, decreased ability for walking more than 50 ft using SPC, flexed posture      Balance   Balance Assessed  Yes      Standardized Balance Assessment   Standardized Balance Assessment  Five Times Sit to Stand    Five times sit to stand comments   43 seconds using arms to push up from arm chair                Objective measurements completed on examination: See above findings.              PT Education - 08/08/18 2221    Education Details  HEP, POC    Person(s) Educated  Patient    Methods  Explanation;Demonstration;Verbal cues;Handout    Comprehension  Verbalized understanding;Need further instruction          PT Long Term Goals - 08/08/18 2229      PT LONG TERM GOAL #1   Title  Pt will be I and compliant with HEP. (Target goal for all goals 6 weeks 09/19/18)    Status  New      PT LONG TERM GOAL #2   Title  Pt will improve lumbar ROM to [facility] >50% to improve mobility    Status  New      PT LONG TERM GOAL #3   Title  Pt will improve leg strength to at least 4+/5 MMT to improve function    Status  New      PT LONG TERM GOAL #4   Title  Pt will improve FOTO to less than 57% limited to show improved function      PT LONG TERM GOAL #5    Title  Pt will improve 5TSTS test to less than 30 seconds to show improved balance and leg endurance.    Baseline  43    Status  New             Plan - 08/08/18 2223    Clinical Impression Statement  Pt presents with signs and symptoms of chronic bilateral low back pain, DDD,,spinal stenosis with anterolisthesis L3-4,  generalized leg weakness and gait unsteadiness  . He has overall lumbar and hip ROM, decreased strength, decreased activity tolerance particularly with standing or walking, and increased pain limiting his functional abilities. He will benefit from skilled PT to address his deficits.    Personal Factors and Comorbidities  Age;Comorbidity 1;Comorbidity 2;Comorbidity 3+;Time since onset of injury/illness/exacerbation;Past/Current Experience;Fitness    Comorbidities  PMH:AAA, Afib, CAAD, chronic LBP, GERD, CVA 09 and 2013, HTN, PVD    Examination-Activity Limitations  Bathing;Transfers;Bend;Sit;Carry;Squat;Stairs;Dressing;Stand;Lift    Examination-Participation Restrictions  Meal Prep;Cleaning;Driving;Laundry;Community Activity    Stability/Clinical Decision Making  Evolving/Moderate complexity    Clinical Decision Making  Moderate    Rehab Potential  Good    PT Frequency  2x /  week    PT Duration  6 weeks    PT Treatment/Interventions  ADLs/Self Care Home Management;Cryotherapy;Electrical Stimulation;Iontophoresis 4mg /ml Dexamethasone;Moist Heat;Traction;Ultrasound;Gait training;Stair training;Therapeutic activities;Therapeutic exercise;Neuromuscular re-education;Balance training;Manual techniques;Passive range of motion;Energy conservation;Taping;Joint Manipulations    PT Next Visit Plan  review HEP, consider TUG, balance test or 3-6 min walk test    PT Home Exercise Plan  HSS, SKTC, supine marches, sit to stand, seated flex stretch    Consulted and Agree with Plan of Care  Patient       Patient will benefit from skilled therapeutic intervention in order to improve the  following deficits and impairments:  Abnormal gait, Decreased activity tolerance, Decreased balance, Decreased endurance, Decreased mobility, Decreased range of motion, Decreased strength, Difficulty walking, Hypomobility, Impaired flexibility, Postural dysfunction, Improper body mechanics, Pain  Visit Diagnosis: 1. Chronic bilateral low back pain without sciatica   2. Muscle weakness (generalized)   3. Other abnormalities of gait and mobility        Problem List Patient Active Problem List   Diagnosis Date Noted  . Muscle weakness (generalized) 04/08/2018  . Chronic pain of right knee 03/07/2017  . Chronic low back pain without sciatica 03/07/2017  . Urethral stricture due to infection 01/17/2017  . Nonischemic cardiomyopathy ([place]) 09/25/2016  . Chest tightness 09/05/2016  . Shortness of breath 09/05/2016  . Bradycardia 08/27/2016  . Microalbuminuria 07/26/2015  . Physical deconditioning 04/26/2015  . Paroxysmal atrial fibrillation (HCC)   . History of CVA (cerebrovascular accident) without residual deficits   . Type 2 diabetes mellitus without complication, without long-term current use of insulin ([place])   . Acute blood loss anemia   . Coronary artery disease involving native coronary artery of native heart without angina pectoris   . Stricture, urethra   . Chronic kidney disease (CKD) 03/16/2015  . Depression 03/16/2015  . Achalasia   . Feeling weak   . Malnutrition of moderate degree 01/25/2015  . Weight loss, unintentional   . Dehydration   . Bladder outlet obstruction 01/20/2015  . Balance problem   . Bilateral wrist pain 09/09/2014  . Decreased visual acuity 04/08/2014  . Polyuria 03/18/2014  . GERD (gastroesophageal reflux disease) 10/21/2013  . AAA (abdominal aortic aneurysm) without rupture ([place]) 06/24/2013  . PVD (peripheral vascular disease) ([place]) 06/24/2013  . Dizziness 03/18/2013  . Hearing loss of both ears 03/18/2013  . BPH (benign prostatic hyperplasia)  02/16/2013  . Urethral stricture 02/16/2013  . Preop cardiovascular exam 02/16/2013  . Other malaise and fatigue 07/17/2012  . Neuropathic pain of both legs 05/10/2012  . CVA (cerebral infarction) 01/10/2012  . TIA (transient ischemic attack) 01/08/2012  . Knee pain, bilateral 04/09/2011  . Urinary frequency 04/09/2011  . BACK PAIN, LUMBAR, CHRONIC 11/27/2007  . Hyperlipidemia 02/05/2007  . Overweight(278.02) 06/04/2006  . Essential hypertension 03/27/2006  . IMPOTENCE, ORGANIC 03/27/2006    Silvestre Mesi 08/08/2018, 10:37 PM  [facility] [address] [place], [place], [address] Phone: [phone]   Fax:  [phone]  Name: Brian Burns MRN: [id number] Date of Birth: July 28, 1945

## 2018-08-10 ENCOUNTER — Other Ambulatory Visit: Payer: Self-pay

## 2018-08-10 ENCOUNTER — Ambulatory Visit: Payer: Medicare Other | Admitting: Physical Therapy

## 2018-08-10 DIAGNOSIS — R2689 Other abnormalities of gait and mobility: Secondary | ICD-10-CM

## 2018-08-10 DIAGNOSIS — M545 Low back pain, unspecified: Secondary | ICD-10-CM

## 2018-08-10 DIAGNOSIS — G8929 Other chronic pain: Secondary | ICD-10-CM | POA: Diagnosis not present

## 2018-08-10 DIAGNOSIS — M6281 Muscle weakness (generalized): Secondary | ICD-10-CM | POA: Diagnosis not present

## 2018-08-10 NOTE — Therapy (Signed)
[place], [place], [address] Phone: [phone]   Fax:  [phone]  Physical Therapy Treatment  Patient Details  Name: Brian Burns MRN: [id number] Date of Birth: August 03, 1945 Referring Provider (PT): Kinnie Feil, MD resident, Lovenia Kim, MD   Encounter Date: 08/10/2018  PT End of Session - 08/10/18 0919    Visit Number  2    Number of Visits  12    Date for PT Re-Evaluation  09/19/18    Authorization Type  [facility] MCR/MCD    PT Start Time  0829    PT Stop Time  0922    PT Time Calculation (min)  53 min    Activity Tolerance  Patient tolerated treatment well    Behavior During Therapy  [facility] for tasks assessed/performed       Past Medical History:  Diagnosis Date  . AAA (abdominal aortic aneurysm) without rupture ([place]) 06/2014   no change in last exam from 2015  . Arthritis    knees,lower back  . Atrial fibrillation ([place])   . Atrial flutter with rapid ventricular response ([place]) 01/16/2015  . CAD (coronary artery disease)    pt denies  . Carotid artery disease ([place])   . Chronic back pain   . Dyspnea   . Erectile dysfunction   . GERD (gastroesophageal reflux disease)   . Headache    hx of  . History of CVA (cerebrovascular accident)    2009  &  2013  . HLD (hyperlipidemia)   . Hypertension   . Idiopathic pancreatitis 12/2014   acute  . Pre-diabetes    per pt. no meds  . PVD (peripheral vascular disease) with claudication [facility])     Past Surgical History:  Procedure Laterality Date  . back injections     [employer]  . CARDIOVASCULAR STRESS TEST  08-09-2009   mild global hypokinesis,  no ischemia or scar/  ef 41%  . CATARACT EXTRACTION W/ INTRAOCULAR LENS  IMPLANT, BILATERAL  april and may 2014  . CYSTOSCOPY N/A 01/17/2017   Procedure: CYSTOSCOPY FLEXIBLE;  Surgeon: Ardis Hughs, MD;  Location: [facility];  Service: Urology;  Laterality: N/A;  . CYSTOSCOPY WITH  RETROGRADE URETHROGRAM N/A 11/24/2014   Procedure: CYSTOSCOPY WITH RETROGRADE URETHROGRAM;  Surgeon: Ardis Hughs, MD;  Location: [facility];  Service: Urology;  Laterality: N/A;  . CYSTOSCOPY WITH URETHRAL DILATATION N/A 11/24/2014   Procedure: CYSTOSCOPY WITH URETHRAL DILATATION;  Surgeon: Ardis Hughs, MD;  Location: [facility];  Service: Urology;  Laterality: N/A;  BALLOON DILATION        . ESOPHAGOGASTRODUODENOSCOPY N/A 05/15/2014   Procedure: ESOPHAGOGASTRODUODENOSCOPY (EGD);  Surgeon: Laurence Spates, MD;  Location: [facility] END[facility]COPY;  Service: Endoscopy;  Laterality: N/A;  . ESOPHAGOGASTRODUODENOSCOPY Left 02/02/2015   Procedure: ESOPHAGOGASTRODUODENOSCOPY (EGD);  Surgeon: Teena Irani, MD;  Location: [facility] END[facility]COPY;  Service: Endoscopy;  Laterality: Left;  . ESOPHAGOGASTRODUODENOSCOPY Left 03/16/2015   Procedure: ESOPHAGOGASTRODUODENOSCOPY (EGD);  Surgeon: Teena Irani, MD;  Location: [facility];  Service: Endoscopy;  Laterality: Left;  . TRANSTHORACIC ECHOCARDIOGRAM  01-07-2012   mild to moderate dilated LV,  ef 45-50%,  mild basal hypokinesis,  grade I diastolic  dysfunction/  trivial AR/  mild MR  . URETHROPLASTY N/A 01/17/2017   Procedure: URETHEROPLASTY BUCCAL GRAFT AND BUCCAL MUCOSA GRAFT HARVEST;  Surgeon: Ardis Hughs, MD;  Location: [facility];  Service: Urology;  Laterality: N/A;    There were no vitals filed  for this visit.  Subjective Assessment - 08/10/18 0839    Subjective  He says back pain is about the same, he does not rate it when asked. He was asked about his HEP and he says he has not had a chance to try it.    Pertinent History  PMH:AAA, Afib, CAAD, chronic LBP, GERD, CA 09 and 2013, HTN, PVD    Limitations  Sitting;Lifting;Standing;Walking;House hold activities    How long can you sit comfortably?  10    How long can you stand comfortably?  5 min    How long can you walk comfortably?  50 ft    Diagnostic tests  MRI showed  L3-L4 spinal stenosis with 2 mm of anterolisthesis due to OA.  L4-L5 had endplate osteophytes and shallow protrusion of the disc. Asymmetric foraminal stenosis on the left because of encroachment by osteophyte and bulging disc material could affect the left L4 nerve    Pain Onset  More than a month ago         [facility] - 08/10/18 0001      Balance   Balance Assessed  Yes      Standardized Balance Assessment   Standardized Balance Assessment  Timed Up and Go Test      Timed Up and Go Test   Normal TUG (seconds)  45                   [facility] Adult PT Treatment/Exercise - 08/10/18 0001      Exercises   Exercises  Lumbar      Lumbar Exercises: Stretches   Passive Hamstring Stretch  Right;Left;2 reps;30 seconds    Passive Hamstring Stretch Limitations  seated    Single Knee to Chest Stretch  Right;Left;2 reps;30 seconds    Double Knee to Chest Stretch  2 reps;30 seconds    Lower Trunk Rotation  3 reps;10 seconds    Hip Flexor Stretch  --    Hip Flexor Stretch Limitations  --      Lumbar Exercises: Aerobic   Nustep  L2 6 min UE/LE      Lumbar Exercises: Seated   Long Arc Quad on Chair  Right;Left;20 reps    Sit to Stand  5 reps      Lumbar Exercises: Supine   Clam  20 reps    Clam Limitations  red    Bent Knee Raise  10 reps      Modalities   Modalities  Geologist, engineeringlectrical Stimulation      Electrical Stimulation   Electrical Stimulation Location  lumbar    Electrical Stimulation Action  IFC    Electrical Stimulation Parameters  tolerance in sitting 13 min    Electrical Stimulation Goals  Pain                  PT Long Term Goals - 08/08/18 2229      PT LONG TERM GOAL #1   Title  Pt will be I and compliant with HEP. (Target goal for all goals 6 weeks 09/19/18)    Status  New      PT LONG TERM GOAL #2   Title  Pt will improve lumbar ROM to [facility] >50% to improve mobility    Status  New      PT LONG TERM GOAL #3   Title  Pt will improve leg  strength to at least 4+/5 MMT to improve function    Status  New  PT LONG TERM GOAL #4   Title  Pt will improve FOTO to less than 57% limited to show improved function      PT LONG TERM GOAL #5   Title  Pt will improve 5TSTS test to less than 30 seconds to show improved balance and leg endurance.    Baseline  43    Status  New            Plan - 08/10/18 [id number]    Clinical Impression Statement  HEP reviewed and he needed cuing for proper technique. He was educated on importance of performing HEP regularly at home in order to help his back pain and leg weakness. TENS trialed today for pain relief.    Personal Factors and Comorbidities  Age;Comorbidity 1;Comorbidity 2;Comorbidity 3+;Time since onset of injury/illness/exacerbation;Past/Current Experience;Fitness    Comorbidities  PMH:AAA, Afib, CAAD, chronic LBP, GERD, CVA 09 and 2013, HTN, PVD    Examination-Activity Limitations  Bathing;Transfers;Bend;Sit;Carry;Squat;Stairs;Dressing;Stand;Lift    Examination-Participation Restrictions  Meal Prep;Cleaning;Driving;Laundry;Community Activity    Stability/Clinical Decision Making  Evolving/Moderate complexity    Rehab Potential  Good    PT Frequency  2x / week    PT Duration  6 weeks    PT Treatment/Interventions  ADLs/Self Care Home Management;Cryotherapy;Electrical Stimulation;Iontophoresis 4mg /ml Dexamethasone;Moist Heat;Traction;Ultrasound;Gait training;Stair training;Therapeutic activities;Therapeutic exercise;Neuromuscular re-education;Balance training;Manual techniques;Passive range of motion;Energy conservation;Taping;Joint Manipulations    PT Next Visit Plan  needs lumbar flexion based stretching for stenosis, needs LE stretching and strengthening, standing and walking tolerance, balance, consider 3-6 min walk test    PT Home Exercise Plan  HSS, SKTC, supine marches, sit to stand, seated flex stretch    Consulted and Agree with Plan of Care  Patient       Patient will benefit  from skilled therapeutic intervention in order to improve the following deficits and impairments:  Abnormal gait, Decreased activity tolerance, Decreased balance, Decreased endurance, Decreased mobility, Decreased range of motion, Decreased strength, Difficulty walking, Hypomobility, Impaired flexibility, Postural dysfunction, Improper body mechanics, Pain  Visit Diagnosis: 1. Chronic bilateral low back pain without sciatica   2. Muscle weakness (generalized)   3. Other abnormalities of gait and mobility        Problem List Patient Active Problem List   Diagnosis Date Noted  . Muscle weakness (generalized) 04/08/2018  . Chronic pain of right knee 03/07/2017  . Chronic low back pain without sciatica 03/07/2017  . Urethral stricture due to infection 01/17/2017  . Nonischemic cardiomyopathy (HCC) 09/25/2016  . Chest tightness 09/05/2016  . Shortness of breath 09/05/2016  . Bradycardia 08/27/2016  . Microalbuminuria 07/26/2015  . Physical deconditioning 04/26/2015  . Paroxysmal atrial fibrillation (HCC)   . History of CVA (cerebrovascular accident) without residual deficits   . Type 2 diabetes mellitus without complication, without long-term current use of insulin (HCC)   . Acute blood loss anemia   . Coronary artery disease involving native coronary artery of native heart without angina pectoris   . Stricture, urethra   . Chronic kidney disease (CKD) 03/16/2015  . Depression 03/16/2015  . Achalasia   . Feeling weak   . Malnutrition of moderate degree 01/25/2015  . Weight loss, unintentional   . Dehydration   . Bladder outlet obstruction 01/20/2015  . Balance problem   . Bilateral wrist pain 09/09/2014  . Decreased visual acuity 04/08/2014  . Polyuria 03/18/2014  . GERD (gastroesophageal reflux disease) 10/21/2013  . AAA (abdominal aortic aneurysm) without rupture (HCC) 06/24/2013  . PVD (peripheral vascular disease) (HCC) 06/24/2013  .  Dizziness 03/18/2013  . Hearing loss  of both ears 03/18/2013  . BPH (benign prostatic hyperplasia) 02/16/2013  . Urethral stricture 02/16/2013  . Preop cardiovascular exam 02/16/2013  . Other malaise and fatigue 07/17/2012  . Neuropathic pain of both legs 05/10/2012  . CVA (cerebral infarction) 01/10/2012  . TIA (transient ischemic attack) 01/08/2012  . Knee pain, bilateral 04/09/2011  . Urinary frequency 04/09/2011  . BACK PAIN, LUMBAR, CHRONIC 11/27/2007  . Hyperlipidemia 02/05/2007  . Overweight(278.02) 06/04/2006  . Essential hypertension 03/27/2006  . IMPOTENCE, ORGANIC 03/27/2006    Birdie Riddle 08/10/2018, 9:27 AM  [facility] [address] [place], [place], [address] Phone: [phone]   Fax:  [phone]  Name: TYREZ SEVER MRN: [id number] Date of Birth: 1945/06/22

## 2018-08-18 ENCOUNTER — Ambulatory Visit: Payer: Medicare Other | Admitting: Physical Therapy

## 2018-08-18 ENCOUNTER — Other Ambulatory Visit: Payer: Self-pay

## 2018-08-18 DIAGNOSIS — G8929 Other chronic pain: Secondary | ICD-10-CM | POA: Diagnosis not present

## 2018-08-18 DIAGNOSIS — R2689 Other abnormalities of gait and mobility: Secondary | ICD-10-CM

## 2018-08-18 DIAGNOSIS — M6281 Muscle weakness (generalized): Secondary | ICD-10-CM | POA: Diagnosis not present

## 2018-08-18 DIAGNOSIS — M545 Low back pain: Secondary | ICD-10-CM | POA: Diagnosis not present

## 2018-08-18 NOTE — Therapy (Signed)
[facility] [address] [place], [place], [address] Phone: [phone]   Fax:  [phone]  Physical Therapy Treatment  Patient Details  Name: Brian Asalhomas L Lemler MRN: [id number] Date of Birth: [birth year] Referring Provider (PT): Doreene ElandEniola, Kehinde T, MD resident, Freddrick MarchAmin, Yashika, MD   Encounter Date: 08/18/2018  PT End of Session - 08/18/18 1523    Visit Number  3    Number of Visits  12    Date for PT Re-Evaluation  09/19/18    Authorization Type  UHC MCR/MCD    PT Start Time  1345    PT Stop Time  1432    PT Time Calculation (min)  47 min    Activity Tolerance  Patient tolerated treatment well    Behavior During Therapy  [facility] for tasks assessed/performed       Past Medical History:  Diagnosis Date  . AAA (abdominal aortic aneurysm) without rupture (HCC) 06/2014   no change in last exam from 2015  . Arthritis    knees,lower back  . Atrial fibrillation (HCC)   . Atrial flutter with rapid ventricular response (HCC) 01/16/2015  . CAD (coronary artery disease)    pt denies  . Carotid artery disease (HCC)   . Chronic back pain   . Dyspnea   . Erectile dysfunction   . GERD (gastroesophageal reflux disease)   . Headache    hx of  . History of CVA (cerebrovascular accident)    2009  &  2013  . HLD (hyperlipidemia)   . Hypertension   . Idiopathic pancreatitis 12/2014   acute  . Pre-diabetes    per pt. no meds  . PVD (peripheral vascular disease) with claudication [facility](HCC)     Past Surgical History:  Procedure Laterality Date  . back injections     [facility]  . CARDIOVASCULAR STRESS TEST  08-09-2009   mild global hypokinesis,  no ischemia or scar/  ef 41%  . CATARACT EXTRACTION W/ INTRAOCULAR LENS  IMPLANT, BILATERAL  april and may 2014  . CYSTOSCOPY N/A 01/17/2017   Procedure: CYSTOSCOPY FLEXIBLE;  Surgeon: Crist FatHerrick, Benjamin W, MD;  Location: [facility];  Service: Urology;  Laterality: N/A;  . CYSTOSCOPY WITH  RETROGRADE URETHROGRAM N/A 11/24/2014   Procedure: CYSTOSCOPY WITH RETROGRADE URETHROGRAM;  Surgeon: Crist FatBenjamin W Herrick, MD;  [facility] [place];  Service: Urology;  Laterality: N/A;  . CYSTOSCOPY WITH URETHRAL DILATATION N/A 11/24/2014   Procedure: CYSTOSCOPY WITH URETHRAL DILATATION;  Surgeon: Crist FatBenjamin W Herrick, MD;  [facility] [place];  Service: Urology;  Laterality: N/A;  BALLOON DILATION        . ESOPHAGOGASTRODUODENOSCOPY N/A 05/15/2014   Procedure: ESOPHAGOGASTRODUODENOSCOPY (EGD);  Surgeon: Carman ChingJames Edwards, MD;  [facility];  Service: Endoscopy;  Laterality: N/A;  . ESOPHAGOGASTRODUODENOSCOPY Left 02/02/2015   Procedure: ESOPHAGOGASTRODUODENOSCOPY (EGD);  Surgeon: Dorena CookeyJohn Hayes, MD;  [facility];  Service: Endoscopy;  Laterality: Left;  . ESOPHAGOGASTRODUODENOSCOPY Left 03/16/2015   Procedure: ESOPHAGOGASTRODUODENOSCOPY (EGD);  Surgeon: Dorena CookeyJohn Hayes, MD;  [facility];  Service: Endoscopy;  Laterality: Left;  . TRANSTHORACIC ECHOCARDIOGRAM  01-07-2012   mild to moderate dilated LV,  ef 45-50%,  mild basal hypokinesis,  grade I diastolic  dysfunction/  trivial AR/  mild MR  . URETHROPLASTY N/A 01/17/2017   Procedure: URETHEROPLASTY BUCCAL GRAFT AND BUCCAL MUCOSA GRAFT HARVEST;  Surgeon: Crist FatHerrick, Benjamin W, MD;  [facility];  Service: Urology;  Laterality: N/A;    There were no vitals filed  for this visit.  Subjective Assessment - 08/18/18 1455    Subjective  When asked about his back he says "Well I'm here"    Currently in Pain?  Other (Comment)   does not rate today                      [facility] Adult PT Treatment/Exercise - 08/18/18 0001      Lumbar Exercises: Stretches   Passive Hamstring Stretch  Right;Left;2 reps;30 seconds    Passive Hamstring Stretch Limitations  seated    Double Knee to Chest Stretch  2 reps;30 seconds    Lower Trunk Rotation  3 reps;10 seconds    Other Lumbar Stretch  Exercise  seated ball roll outs 5 sec X 15      Lumbar Exercises: Aerobic   Nustep  L3 6 min UE/LE      Lumbar Exercises: Standing   Row  15 reps    Theraband Level (Row)  Level 2 (Red)    Shoulder Extension  15 reps    Theraband Level (Shoulder Extension)  Level 2 (Red)      Lumbar Exercises: Seated   Long Arc Quad on Chair  Right;Left;20 reps    LAQ on Chair Weights (lbs)  2    Sit to Stand  Other (comment)    Sit to Stand Limitations  6 reps      Lumbar Exercises: Supine   Clam  20 reps    Clam Limitations  green    Bridge  10 reps    Bridge Limitations  limited ROM      Modalities   Modalities  Moist Heat      Moist Heat Therapy   Number Minutes Moist Heat  7 Minutes    Moist Heat Location  Lumbar Spine   seated post Tx                 PT Long Term Goals - 08/18/18 1525      PT LONG TERM GOAL #1   Title  Pt will be I and compliant with HEP. (Target goal for all goals 6 weeks 09/19/18)    Status  On-going      PT LONG TERM GOAL #2   Title  Pt will improve lumbar ROM to [facility] >50% to improve mobility    Status  On-going      PT LONG TERM GOAL #3   Title  Pt will improve leg strength to at least 4+/5 MMT to improve function    Status  On-going      PT LONG TERM GOAL #4   Title  Pt will improve FOTO to less than 57% limited to show improved function    Status  On-going      PT LONG TERM GOAL #5   Title  Pt will improve 5TSTS test to less than 30 seconds to show improved balance and leg endurance.    Baseline  43    Status  On-going            Plan - 08/18/18 1524    Clinical Impression Statement  He was progressed with strengthening and postural corrective exercises as tolerated today. He continues to have significant deficits in posture, strength, and standing tolerance and will continue to benefit from PT.    Personal Factors and Comorbidities  Age;Comorbidity 1;Comorbidity 2;Comorbidity 3+;Time since onset of  injury/illness/exacerbation;Past/Current Experience;Fitness    Comorbidities  PMH:AAA, Afib, CAAD, chronic LBP, GERD, CVA 09  and 2013, HTN, PVD    Examination-Activity Limitations  Bathing;Transfers;Bend;Sit;Carry;Squat;Stairs;Dressing;Stand;Lift    Examination-Participation Restrictions  Meal Prep;Cleaning;Driving;Laundry;Community Activity    Stability/Clinical Decision Making  Evolving/Moderate complexity    Rehab Potential  Good    PT Frequency  2x / week    PT Duration  6 weeks    PT Treatment/Interventions  ADLs/Self Care Home Management;Cryotherapy;Electrical Stimulation;Iontophoresis 4mg /ml Dexamethasone;Moist Heat;Traction;Ultrasound;Gait training;Stair training;Therapeutic activities;Therapeutic exercise;Neuromuscular re-education;Balance training;Manual techniques;Passive range of motion;Energy conservation;Taping;Joint Manipulations    PT Next Visit Plan  needs lumbar flexion based stretching for stenosis, needs LE stretching and strengthening, standing and walking tolerance, balance, consider 3-6 min walk test    PT Home Exercise Plan  HSS, SKTC, supine marches, sit to stand, seated flex stretch    Consulted and Agree with Plan of Care  Patient       Patient will benefit from skilled therapeutic intervention in order to improve the following deficits and impairments:  Abnormal gait, Decreased activity tolerance, Decreased balance, Decreased endurance, Decreased mobility, Decreased range of motion, Decreased strength, Difficulty walking, Hypomobility, Impaired flexibility, Postural dysfunction, Improper body mechanics, Pain  Visit Diagnosis: 1. Chronic bilateral low back pain without sciatica   2. Muscle weakness (generalized)   3. Other abnormalities of gait and mobility        Problem List Patient Active Problem List   Diagnosis Date Noted  . Muscle weakness (generalized) 04/08/2018  . Chronic pain of right knee 03/07/2017  . Chronic low back pain without sciatica  03/07/2017  . Urethral stricture due to infection 01/17/2017  . Nonischemic cardiomyopathy ([place]) 09/25/2016  . Chest tightness 09/05/2016  . Shortness of breath 09/05/2016  . Bradycardia 08/27/2016  . Microalbuminuria 07/26/2015  . Physical deconditioning 04/26/2015  . Paroxysmal atrial fibrillation (HCC)   . History of CVA (cerebrovascular accident) without residual deficits   . Type 2 diabetes mellitus without complication, without long-term current use of insulin ([place])   . Acute blood loss anemia   . Coronary artery disease involving native coronary artery of native heart without angina pectoris   . Stricture, urethra   . Chronic kidney disease (CKD) 03/16/2015  . Depression 03/16/2015  . Achalasia   . Feeling weak   . Malnutrition of moderate degree 01/25/2015  . Weight loss, unintentional   . Dehydration   . Bladder outlet obstruction 01/20/2015  . Balance problem   . Bilateral wrist pain 09/09/2014  . Decreased visual acuity 04/08/2014  . Polyuria 03/18/2014  . GERD (gastroesophageal reflux disease) 10/21/2013  . AAA (abdominal aortic aneurysm) without rupture ([place]) 06/24/2013  . PVD (peripheral vascular disease) ([place]) 06/24/2013  . Dizziness 03/18/2013  . Hearing loss of both ears 03/18/2013  . BPH (benign prostatic hyperplasia) 02/16/2013  . Urethral stricture 02/16/2013  . Preop cardiovascular exam 02/16/2013  . Other malaise and fatigue 07/17/2012  . Neuropathic pain of both legs 05/10/2012  . CVA (cerebral infarction) 01/10/2012  . TIA (transient ischemic attack) 01/08/2012  . Knee pain, bilateral 04/09/2011  . Urinary frequency 04/09/2011  . BACK PAIN, LUMBAR, CHRONIC 11/27/2007  . Hyperlipidemia 02/05/2007  . Overweight(278.02) 06/04/2006  . Essential hypertension 03/27/2006  . IMPOTENCE, ORGANIC 03/27/2006    Brian Burns 08/18/2018, 3:28 PM  [facility] [address] [place], [place],  [address] Phone: [phone]   Fax:  [phone]  Name: Brian Burns MRN: [id number] Date of Birth: 1945-12-21

## 2018-08-19 DIAGNOSIS — H35033 Hypertensive retinopathy, bilateral: Secondary | ICD-10-CM | POA: Diagnosis not present

## 2018-08-19 DIAGNOSIS — H40013 Open angle with borderline findings, low risk, bilateral: Secondary | ICD-10-CM | POA: Diagnosis not present

## 2018-08-19 DIAGNOSIS — Z961 Presence of intraocular lens: Secondary | ICD-10-CM | POA: Diagnosis not present

## 2018-08-19 DIAGNOSIS — R7309 Other abnormal glucose: Secondary | ICD-10-CM | POA: Diagnosis not present

## 2018-08-21 ENCOUNTER — Ambulatory Visit: Payer: Medicare Other | Admitting: Physical Therapy

## 2018-08-21 ENCOUNTER — Other Ambulatory Visit: Payer: Self-pay

## 2018-08-21 DIAGNOSIS — M6281 Muscle weakness (generalized): Secondary | ICD-10-CM | POA: Diagnosis not present

## 2018-08-21 DIAGNOSIS — G8929 Other chronic pain: Secondary | ICD-10-CM

## 2018-08-21 DIAGNOSIS — R2689 Other abnormalities of gait and mobility: Secondary | ICD-10-CM

## 2018-08-21 DIAGNOSIS — M545 Low back pain, unspecified: Secondary | ICD-10-CM

## 2018-08-21 NOTE — Therapy (Signed)
[facility] [address] [place], [place], [address] Phone: [phone]   Fax:  [phone]  Physical Therapy Treatment  Patient Details  Name: Brian Asalhomas L Bluestone MRN: [id number] Date of Birth: [birth year] Referring Provider (PT): Doreene ElandEniola, Kehinde T, MD resident, Freddrick MarchAmin, Yashika, MD   Encounter Date: 08/21/2018  PT End of Session - 08/21/18 1050    Visit Number  4    Number of Visits  12    Date for PT Re-Evaluation  09/19/18    Authorization Type  UHC MCR/MCD    PT Start Time  1015    PT Stop Time  1055    PT Time Calculation (min)  40 min    Activity Tolerance  Patient tolerated treatment well    Behavior During Therapy  [facility] for tasks assessed/performed       Past Medical History:  Diagnosis Date  . AAA (abdominal aortic aneurysm) without rupture (HCC) 06/2014   no change in last exam from 2015  . Arthritis    knees,lower back  . Atrial fibrillation (HCC)   . Atrial flutter with rapid ventricular response (HCC) 01/16/2015  . CAD (coronary artery disease)    pt denies  . Carotid artery disease (HCC)   . Chronic back pain   . Dyspnea   . Erectile dysfunction   . GERD (gastroesophageal reflux disease)   . Headache    hx of  . History of CVA (cerebrovascular accident)    2009  &  2013  . HLD (hyperlipidemia)   . Hypertension   . Idiopathic pancreatitis 12/2014   acute  . Pre-diabetes    per pt. no meds  . PVD (peripheral vascular disease) with claudication [facility](HCC)     Past Surgical History:  Procedure Laterality Date  . back injections     [facility]  . CARDIOVASCULAR STRESS TEST  08-09-2009   mild global hypokinesis,  no ischemia or scar/  ef 41%  . CATARACT EXTRACTION W/ INTRAOCULAR LENS  IMPLANT, BILATERAL  april and may 2014  . CYSTOSCOPY N/A 01/17/2017   Procedure: CYSTOSCOPY FLEXIBLE;  Surgeon: Crist FatHerrick, Benjamin W, MD;  [facility];  Service: Urology;  Laterality: N/A;  . CYSTOSCOPY WITH  RETROGRADE URETHROGRAM N/A 11/24/2014   Procedure: CYSTOSCOPY WITH RETROGRADE URETHROGRAM;  Surgeon: Crist FatBenjamin W Herrick, MD;  [facility] [place];  Service: Urology;  Laterality: N/A;  . CYSTOSCOPY WITH URETHRAL DILATATION N/A 11/24/2014   Procedure: CYSTOSCOPY WITH URETHRAL DILATATION;  Surgeon: Crist FatBenjamin W Herrick, MD;  [facility] [place];  Service: Urology;  Laterality: N/A;  BALLOON DILATION        . ESOPHAGOGASTRODUODENOSCOPY N/A 05/15/2014   Procedure: ESOPHAGOGASTRODUODENOSCOPY (EGD);  Surgeon: Carman ChingJames Edwards, MD;  [facility];  Service: Endoscopy;  Laterality: N/A;  . ESOPHAGOGASTRODUODENOSCOPY Left 02/02/2015   Procedure: ESOPHAGOGASTRODUODENOSCOPY (EGD);  Surgeon: Dorena CookeyJohn Hayes, MD;  [facility];  Service: Endoscopy;  Laterality: Left;  . ESOPHAGOGASTRODUODENOSCOPY Left 03/16/2015   Procedure: ESOPHAGOGASTRODUODENOSCOPY (EGD);  Surgeon: Dorena CookeyJohn Hayes, MD;  [facility];  Service: Endoscopy;  Laterality: Left;  . TRANSTHORACIC ECHOCARDIOGRAM  01-07-2012   mild to moderate dilated LV,  ef 45-50%,  mild basal hypokinesis,  grade I diastolic  dysfunction/  trivial AR/  mild MR  . URETHROPLASTY N/A 01/17/2017   Procedure: URETHEROPLASTY BUCCAL GRAFT AND BUCCAL MUCOSA GRAFT HARVEST;  Surgeon: Crist FatHerrick, Benjamin W, MD;  [facility];  Service: Urology;  Laterality: N/A;    There were no vitals filed  for this visit.  Subjective Assessment - 08/21/18 1022    Subjective  "Im doing okay but my back always hurts and is stiff    Pertinent History  PMH:AAA, Afib, CAAD, chronic LBP, GERD, [place] 09 and 2013, HTN, PVD    Limitations  Sitting;Lifting;Standing;Walking;House hold activities    How long can you sit comfortably?  10    How long can you stand comfortably?  5 min    How long can you walk comfortably?  50 ft    Diagnostic tests  MRI showed L3-L4 spinal stenosis with 2 mm of anterolisthesis due to OA.  L4-L5 had endplate  osteophytes and shallow protrusion of the disc. Asymmetric foraminal stenosis on the left because of encroachment by osteophyte and bulging disc material could affect the left L4 nerve    Currently in Pain?  Yes    Pain Score  6     Pain Location  Back    Pain Onset  More than a month ago                       [facility] Adult PT Treatment/Exercise - 08/21/18 0001      Lumbar Exercises: Stretches   Passive Hamstring Stretch  Right;Left;2 reps;30 seconds    Passive Hamstring Stretch Limitations  seated    Double Knee to Chest Stretch Limitations  5 sec  X15 feet on pball    Lower Trunk Rotation  3 reps;10 seconds    Other Lumbar Stretch Exercise  seated ball roll outs 5 sec X 15      Lumbar Exercises: Aerobic   Nustep  L4 6 min UE/LE      Lumbar Exercises: Standing   Row  20 reps    Theraband Level (Row)  Level 2 (Red)    Shoulder Extension  20 reps    Theraband Level (Shoulder Extension)  Level 2 (Red)      Lumbar Exercises: Seated   Long Arc Quad on Chair  Right;Left;15 reps    LAQ on Chair Weights (lbs)  3    Sit to Stand Limitations  7 reps      Lumbar Exercises: Supine   Clam  20 reps    Clam Limitations  green    Bridge  10 reps    Bridge Limitations  limited ROM      Modalities   Modalities  --   declined today                 PT Long Term Goals - 08/18/18 1525      PT LONG TERM GOAL #1   Title  Pt will be I and compliant with HEP. (Target goal for all goals 6 weeks 09/19/18)    Status  On-going      PT LONG TERM GOAL #2   Title  Pt will improve lumbar ROM to [facility] >50% to improve mobility    Status  On-going      PT LONG TERM GOAL #3   Title  Pt will improve leg strength to at least 4+/5 MMT to improve function    Status  On-going      PT LONG TERM GOAL #4   Title  Pt will improve FOTO to less than 57% limited to show improved function    Status  On-going      PT LONG TERM GOAL #5   Title  Pt will improve 5TSTS test to less than  30 seconds to  show improved balance and leg endurance.    Baseline  43    Status  On-going            Plan - 08/21/18 1051    Clinical Impression Statement  Session continued to focus on strength, and stretching for lumbar and LE's as well as posture and endurance. He continues to have significant deficits in these areas and will continue to benfit from PT.    Personal Factors and Comorbidities  Age;Comorbidity 1;Comorbidity 2;Comorbidity 3+;Time since onset of injury/illness/exacerbation;Past/Current Experience;Fitness    Comorbidities  PMH:AAA, Afib, CAAD, chronic LBP, GERD, CVA 09 and 2013, HTN, PVD    Examination-Activity Limitations  Bathing;Transfers;Bend;Sit;Carry;Squat;Stairs;Dressing;Stand;Lift    Examination-Participation Restrictions  Meal Prep;Cleaning;Driving;Laundry;Community Activity    Stability/Clinical Decision Making  Evolving/Moderate complexity    Rehab Potential  Good    PT Frequency  2x / week    PT Duration  6 weeks    PT Treatment/Interventions  ADLs/Self Care Home Management;Cryotherapy;Electrical Stimulation;Iontophoresis 4mg /ml Dexamethasone;Moist Heat;Traction;Ultrasound;Gait training;Stair training;Therapeutic activities;Therapeutic exercise;Neuromuscular re-education;Balance training;Manual techniques;Passive range of motion;Energy conservation;Taping;Joint Manipulations    PT Next Visit Plan  needs lumbar flexion based stretching for stenosis, needs LE stretching and strengthening, standing and walking tolerance, balance, consider 3-6 min walk test    PT Home Exercise Plan  HSS, SKTC, supine marches, sit to stand, seated flex stretch    Consulted and Agree with Plan of Care  Patient       Patient will benefit from skilled therapeutic intervention in order to improve the following deficits and impairments:  Abnormal gait, Decreased activity tolerance, Decreased balance, Decreased endurance, Decreased mobility, Decreased range of motion, Decreased strength,  Difficulty walking, Hypomobility, Impaired flexibility, Postural dysfunction, Improper body mechanics, Pain  Visit Diagnosis: 1. Chronic bilateral low back pain without sciatica   2. Muscle weakness (generalized)   3. Other abnormalities of gait and mobility        Problem List Patient Active Problem List   Diagnosis Date Noted  . Muscle weakness (generalized) 04/08/2018  . Chronic pain of right knee 03/07/2017  . Chronic low back pain without sciatica 03/07/2017  . Urethral stricture due to infection 01/17/2017  . Nonischemic cardiomyopathy (HCC) 09/25/2016  . Chest tightness 09/05/2016  . Shortness of breath 09/05/2016  . Bradycardia 08/27/2016  . Microalbuminuria 07/26/2015  . Physical deconditioning 04/26/2015  . Paroxysmal atrial fibrillation (HCC)   . History of CVA (cerebrovascular accident) without residual deficits   . Type 2 diabetes mellitus without complication, without long-term current use of insulin (HCC)   . Acute blood loss anemia   . Coronary artery disease involving native coronary artery of native heart without angina pectoris   . Stricture, urethra   . Chronic kidney disease (CKD) 03/16/2015  . Depression 03/16/2015  . Achalasia   . Feeling weak   . Malnutrition of moderate degree 01/25/2015  . Weight loss, unintentional   . Dehydration   . Bladder outlet obstruction 01/20/2015  . Balance problem   . Bilateral wrist pain 09/09/2014  . Decreased visual acuity 04/08/2014  . Polyuria 03/18/2014  . GERD (gastroesophageal reflux disease) 10/21/2013  . AAA (abdominal aortic aneurysm) without rupture (HCC) 06/24/2013  . PVD (peripheral vascular disease) (HCC) 06/24/2013  . Dizziness 03/18/2013  . Hearing loss of both ears 03/18/2013  . BPH (benign prostatic hyperplasia) 02/16/2013  . Urethral stricture 02/16/2013  . Preop cardiovascular exam 02/16/2013  . Other malaise and fatigue 07/17/2012  . Neuropathic pain of both legs 05/10/2012  . CVA (cerebral  infarction) 01/10/2012  . TIA (transient ischemic attack) 01/08/2012  . Knee pain, bilateral 04/09/2011  . Urinary frequency 04/09/2011  . BACK PAIN, LUMBAR, CHRONIC 11/27/2007  . Hyperlipidemia 02/05/2007  . Overweight(278.02) 06/04/2006  . Essential hypertension 03/27/2006  . IMPOTENCE, ORGANIC 03/27/2006    Brian Burns 08/21/2018, 10:52 AM  [facility] [address] [place], [place], [address] Phone: [phone]   Fax:  [phone]  Name: Brian Burns MRN: [id number] Date of Birth: [birth year]

## 2018-08-24 ENCOUNTER — Other Ambulatory Visit: Payer: Self-pay

## 2018-08-24 ENCOUNTER — Ambulatory Visit: Payer: Medicare Other | Admitting: Physical Therapy

## 2018-08-24 DIAGNOSIS — R2689 Other abnormalities of gait and mobility: Secondary | ICD-10-CM | POA: Diagnosis not present

## 2018-08-24 DIAGNOSIS — G8929 Other chronic pain: Secondary | ICD-10-CM | POA: Diagnosis not present

## 2018-08-24 DIAGNOSIS — M6281 Muscle weakness (generalized): Secondary | ICD-10-CM | POA: Diagnosis not present

## 2018-08-24 DIAGNOSIS — M545 Low back pain: Secondary | ICD-10-CM | POA: Diagnosis not present

## 2018-08-24 NOTE — Therapy (Signed)
[facility] [address] [place], [place], [address] Phone: [phone]   Fax:  [phone]  Physical Therapy Treatment  Patient Details  Name: Brian Burns MRN: [id number] Date of Birth: 10-19-1945 Referring Provider (PT): Doreene Eland, MD resident, Freddrick March, MD   Encounter Date: 08/24/2018  PT End of Session - 08/24/18 1719    Visit Number  5    Number of Visits  12    Date for PT Re-Evaluation  09/19/18    Authorization Type  UHC MCR/MCD    PT Start Time  1445    PT Stop Time  1530    PT Time Calculation (min)  45 min    Activity Tolerance  Patient tolerated treatment well    Behavior During Therapy  [facility] for tasks assessed/performed       Past Medical History:  Diagnosis Date  . AAA (abdominal aortic aneurysm) without rupture (HCC) 06/2014   no change in last exam from 2015  . Arthritis    knees,lower back  . Atrial fibrillation (HCC)   . Atrial flutter with rapid ventricular response (HCC) 01/16/2015  . CAD (coronary artery disease)    pt denies  . Carotid artery disease (HCC)   . Chronic back pain   . Dyspnea   . Erectile dysfunction   . GERD (gastroesophageal reflux disease)   . Headache    hx of  . History of CVA (cerebrovascular accident)    2009  &  2013  . HLD (hyperlipidemia)   . Hypertension   . Idiopathic pancreatitis 12/2014   acute  . Pre-diabetes    per pt. no meds  . PVD (peripheral vascular disease) with claudication [facility])     Past Surgical History:  Procedure Laterality Date  . back injections     [facility]  . CARDIOVASCULAR STRESS TEST  08-09-2009   mild global hypokinesis,  no ischemia or scar/  ef 41%  . CATARACT EXTRACTION W/ INTRAOCULAR LENS  IMPLANT, BILATERAL  april and may 2014  . CYSTOSCOPY N/A 01/17/2017   Procedure: CYSTOSCOPY FLEXIBLE;  Surgeon: Crist Fat, MD;  Location: [facility];  Service: Urology;  Laterality: N/A;  . CYSTOSCOPY WITH  RETROGRADE URETHROGRAM N/A 11/24/2014   Procedure: CYSTOSCOPY WITH RETROGRADE URETHROGRAM;  Surgeon: Crist Fat, MD;  Location: [facility];  Service: Urology;  Laterality: N/A;  . CYSTOSCOPY WITH URETHRAL DILATATION N/A 11/24/2014   Procedure: CYSTOSCOPY WITH URETHRAL DILATATION;  Surgeon: Crist Fat, MD;  Location: [facility];  Service: Urology;  Laterality: N/A;  BALLOON DILATION        . ESOPHAGOGASTRODUODENOSCOPY N/A 05/15/2014   Procedure: ESOPHAGOGASTRODUODENOSCOPY (EGD);  Surgeon: Carman Ching, MD;  Location: [facility] ENDOSCOPY;  Service: Endoscopy;  Laterality: N/A;  . ESOPHAGOGASTRODUODENOSCOPY Left 02/02/2015   Procedure: ESOPHAGOGASTRODUODENOSCOPY (EGD);  Surgeon: Dorena Cookey, MD;  Location: [facility] ENDOSCOPY;  Service: Endoscopy;  Laterality: Left;  . ESOPHAGOGASTRODUODENOSCOPY Left 03/16/2015   Procedure: ESOPHAGOGASTRODUODENOSCOPY (EGD);  Surgeon: Dorena Cookey, MD;  Location: [facility];  Service: Endoscopy;  Laterality: Left;  . TRANSTHORACIC ECHOCARDIOGRAM  01-07-2012   mild to moderate dilated LV,  ef 45-50%,  mild basal hypokinesis,  grade I diastolic  dysfunction/  trivial AR/  mild MR  . URETHROPLASTY N/A 01/17/2017   Procedure: URETHEROPLASTY BUCCAL GRAFT AND BUCCAL MUCOSA GRAFT HARVEST;  Surgeon: Crist Fat, MD;  Location: [facility];  Service: Urology;  Laterality: N/A;    There were no vitals filed  for this visit.  Subjective Assessment - 08/24/18 1540    Subjective  Im doing a littel better, 6/10  pain in my back today    Currently in Pain?  Yes                       [facility] Adult PT Treatment/Exercise - 08/24/18 0001      Lumbar Exercises: Stretches   Passive Hamstring Stretch  Right;Left;2 reps;30 seconds    Passive Hamstring Stretch Limitations  seated      Lumbar Exercises: Aerobic   Nustep  L4 6 min UE/LE      Lumbar Exercises: Standing   Row  20 reps    Theraband Level (Row)  Level 2 (Red)     Shoulder Extension  20 reps    Theraband Level (Shoulder Extension)  Level 2 (Red)    Other Standing Lumbar Exercises  hip flexion, hip abduction with 3 lbs  X20 ea, hip extension and hamstring curls X 10 bilat      Lumbar Exercises: Seated   Long Arc Quad on Chair  Right;Left;15 reps    LAQ on Chair Weights (lbs)  3    Sit to Stand Limitations  2X5                  PT Long Term Goals - 08/18/18 1525      PT LONG TERM GOAL #1   Title  Pt will be I and compliant with HEP. (Target goal for all goals 6 weeks 09/19/18)    Status  On-going      PT LONG TERM GOAL #2   Title  Pt will [facility] to [facility] >50% to improve mobility    Status  On-going      PT LONG TERM GOAL #3   Title  Pt will improve leg strength to at least 4+/5 MMT to improve function    Status  On-going      PT LONG TERM GOAL #4   Title  Pt will improve FOTO to less than 57% limited to show improved function    Status  On-going      PT LONG TERM GOAL #5   Title  Pt will improve 5TSTS test to less than 30 seconds to show improved balance and leg endurance.    Baseline  43    Status  On-going            Plan - 08/24/18 1734    Clinical Impression Statement  Progressed him today from supine therex to standing therex with good tolerance but he is still weak and fatigues quickly. PT will continue to progress as tolerated.    Personal Factors and Comorbidities  Age;Comorbidity 1;Comorbidity 2;Comorbidity 3+;Time since onset of injury/illness/exacerbation;Past/Current Experience;Fitness    Comorbidities  PMH:AAA, Afib, CAAD, chronic LBP, GERD, CVA 09 and 2013, HTN, PVD    Examination-Activity Limitations  Bathing;Transfers;Bend;Sit;Carry;Squat;Stairs;Dressing;Stand;Lift    Examination-Participation Restrictions  Meal Prep;Cleaning;Driving;Laundry;Community Activity    Stability/Clinical Decision Making  Evolving/Moderate complexity    Rehab Potential  Good    PT Frequency  2x / week    PT Duration   6 weeks    PT Treatment/Interventions  ADLs/Self Care Home Management;Cryotherapy;Electrical Stimulation;Iontophoresis 4mg /ml Dexamethasone;Moist Heat;Traction;Ultrasound;Gait training;Stair training;Therapeutic activities;Therapeutic exercise;Neuromuscular re-education;Balance training;Manual techniques;Passive range of motion;Energy conservation;Taping;Joint Manipulations    PT Next Visit Plan  needs lumbar flexion based stretching for stenosis, needs LE stretching and strengthening, standing and walking tolerance, balance, consider 3-6 min walk  test    PT Home Exercise Plan  HSS, SKTC, supine marches, sit to stand, seated flex stretch    Consulted and Agree with Plan of Care  Patient       Patient will benefit from skilled therapeutic intervention in order to improve the following deficits and impairments:  Abnormal gait, Decreased activity tolerance, Decreased balance, Decreased endurance, Decreased mobility, Decreased range of motion, Decreased strength, Difficulty walking, Hypomobility, Impaired flexibility, Postural dysfunction, Improper body mechanics, Pain  Visit Diagnosis: 1. Chronic bilateral low back pain without sciatica   2. Muscle weakness (generalized)   3. Other abnormalities of gait and mobility        Problem List Patient Active Problem List   Diagnosis Date Noted  . Muscle weakness (generalized) 04/08/2018  . Chronic pain of right knee 03/07/2017  . Chronic low back pain without sciatica 03/07/2017  . Urethral stricture due to infection 01/17/2017  . Nonischemic cardiomyopathy ([place]) 09/25/2016  . Chest tightness 09/05/2016  . Shortness of breath 09/05/2016  . Bradycardia 08/27/2016  . Microalbuminuria 07/26/2015  . Physical deconditioning 04/26/2015  . Paroxysmal atrial fibrillation (HCC)   . History of CVA (cerebrovascular accident) without residual deficits   . Type 2 diabetes mellitus without complication, without long-term current use of insulin ([place])   .  Acute blood loss anemia   . Coronary artery disease involving native coronary artery of native heart without angina pectoris   . Stricture, urethra   . Chronic kidney disease (CKD) 03/16/2015  . Depression 03/16/2015  . Achalasia   . Feeling weak   . Malnutrition of moderate degree 01/25/2015  . Weight loss, unintentional   . Dehydration   . Bladder outlet obstruction 01/20/2015  . Balance problem   . Bilateral wrist pain 09/09/2014  . Decreased visual acuity 04/08/2014  . Polyuria 03/18/2014  . GERD (gastroesophageal reflux disease) 10/21/2013  . AAA (abdominal aortic aneurysm) without rupture ([place]) 06/24/2013  . PVD (peripheral vascular disease) ([place]) 06/24/2013  . Dizziness 03/18/2013  . Hearing loss of both ears 03/18/2013  . BPH (benign prostatic hyperplasia) 02/16/2013  . Urethral stricture 02/16/2013  . Preop cardiovascular exam 02/16/2013  . Other malaise and fatigue 07/17/2012  . Neuropathic pain of both legs 05/10/2012  . CVA (cerebral infarction) 01/10/2012  . TIA (transient ischemic attack) 01/08/2012  . Knee pain, bilateral 04/09/2011  . Urinary frequency 04/09/2011  . BACK PAIN, LUMBAR, CHRONIC 11/27/2007  . Hyperlipidemia 02/05/2007  . Overweight(278.02) 06/04/2006  . Essential hypertension 03/27/2006  . IMPOTENCE, ORGANIC 03/27/2006    Debbe Odea, PT,DPT 08/24/2018, 5:36 PM  [facility] [address] [place], [place], [address] Phone: [phone]   Fax:  [phone]  Name: Brian Burns MRN: [id number] Date of Birth: [birth year]

## 2018-08-26 ENCOUNTER — Other Ambulatory Visit: Payer: Self-pay

## 2018-08-26 ENCOUNTER — Ambulatory Visit: Payer: Medicare Other | Admitting: Physical Therapy

## 2018-08-26 DIAGNOSIS — M545 Low back pain, unspecified: Secondary | ICD-10-CM

## 2018-08-26 DIAGNOSIS — R2689 Other abnormalities of gait and mobility: Secondary | ICD-10-CM

## 2018-08-26 DIAGNOSIS — M6281 Muscle weakness (generalized): Secondary | ICD-10-CM | POA: Diagnosis not present

## 2018-08-26 DIAGNOSIS — G8929 Other chronic pain: Secondary | ICD-10-CM

## 2018-08-26 NOTE — Therapy (Signed)
[facility] [address] [place], [place], [address] Phone: [phone]   Fax:  [phone]  Physical Therapy Treatment  Patient Details  Name: Brian Asalhomas L Klemmer MRN: [id number] Date of Birth: [birth year] Referring Provider (PT): Doreene ElandEniola, Kehinde T, MD resident, Freddrick MarchAmin, Yashika, MD   Encounter Date: 08/26/2018  PT End of Session - 08/26/18 1540    Visit Number  6    Number of Visits  12    Date for PT Re-Evaluation  09/19/18    Authorization Type  UHC MCR/MCD    PT Start Time  1445    PT Stop Time  1523    PT Time Calculation (min)  38 min    Activity Tolerance  Patient limited by fatigue    Behavior During Therapy  [facility] for tasks assessed/performed       Past Medical History:  Diagnosis Date  . AAA (abdominal aortic aneurysm) without rupture (HCC) 06/2014   no change in last exam from 2015  . Arthritis    knees,lower back  . Atrial fibrillation (HCC)   . Atrial flutter with rapid ventricular response (HCC) 01/16/2015  . CAD (coronary artery disease)    pt denies  . Carotid artery disease (HCC)   . Chronic back pain   . Dyspnea   . Erectile dysfunction   . GERD (gastroesophageal reflux disease)   . Headache    hx of  . History of CVA (cerebrovascular accident)    2009  &  2013  . HLD (hyperlipidemia)   . Hypertension   . Idiopathic pancreatitis 12/2014   acute  . Pre-diabetes    per pt. no meds  . PVD (peripheral vascular disease) with claudication [facility](HCC)     Past Surgical History:  Procedure Laterality Date  . back injections     [facility]  . CARDIOVASCULAR STRESS TEST  08-09-2009   mild global hypokinesis,  no ischemia or scar/  ef 41%  . CATARACT EXTRACTION W/ INTRAOCULAR LENS  IMPLANT, BILATERAL  april and may 2014  . CYSTOSCOPY N/A 01/17/2017   Procedure: CYSTOSCOPY FLEXIBLE;  Surgeon: Crist FatHerrick, Benjamin W, MD;  Location: [facility];  Service: Urology;  Laterality: N/A;  . CYSTOSCOPY WITH RETROGRADE  URETHROGRAM N/A 11/24/2014   Procedure: CYSTOSCOPY WITH RETROGRADE URETHROGRAM;  Surgeon: Crist FatBenjamin W Herrick, MD;  [facility];  Service: Urology;  Laterality: N/A;  . CYSTOSCOPY WITH URETHRAL DILATATION N/A 11/24/2014   Procedure: CYSTOSCOPY WITH URETHRAL DILATATION;  Surgeon: Crist FatBenjamin W Herrick, MD;  [facility] [place] [facility];  Service: Urology;  Laterality: N/A;  BALLOON DILATION        . ESOPHAGOGASTRODUODENOSCOPY N/A 05/15/2014   Procedure: ESOPHAGOGASTRODUODENOSCOPY (EGD);  Surgeon: Carman ChingJames Edwards, MD;  [facility];  Service: Endoscopy;  Laterality: N/A;  . ESOPHAGOGASTRODUODENOSCOPY Left 02/02/2015   Procedure: ESOPHAGOGASTRODUODENOSCOPY (EGD);  Surgeon: Dorena CookeyJohn Hayes, MD;  [facility];  Service: Endoscopy;  Laterality: Left;  . ESOPHAGOGASTRODUODENOSCOPY Left 03/16/2015   Procedure: ESOPHAGOGASTRODUODENOSCOPY (EGD);  Surgeon: Dorena CookeyJohn Hayes, MD;  [facility];  Service: Endoscopy;  Laterality: Left;  . TRANSTHORACIC ECHOCARDIOGRAM  01-07-2012   mild to moderate dilated LV,  ef 45-50%,  mild basal hypokinesis,  grade I diastolic  dysfunction/  trivial AR/  mild MR  . URETHROPLASTY N/A 01/17/2017   Procedure: URETHEROPLASTY BUCCAL GRAFT AND BUCCAL MUCOSA GRAFT HARVEST;  Surgeon: Crist FatHerrick, Benjamin W, MD;  [facility];  Service: Urology;  Laterality: N/A;    There were no vitals filed  for this visit.  Subjective Assessment - 08/26/18 1507    Subjective  My back isnt hurting today unless I stand or walk too long, its my knees that hurt today    Pertinent History  PMH:AAA, Afib, CAAD, chronic LBP, GERD, [place] 09 and 2013, HTN, PVD    Limitations  Sitting;Lifting;Standing;Walking;House hold activities    How long can you sit comfortably?  10    How long can you stand comfortably?  5 min    How long can you walk comfortably?  50 ft    Diagnostic tests  MRI showed L3-L4 spinal stenosis with 2 mm of anterolisthesis due to OA.   L4-L5 had endplate osteophytes and shallow protrusion of the disc. Asymmetric foraminal stenosis on the left because of encroachment by osteophyte and bulging disc material could affect the left L4 nerve    Currently in Pain?  Yes    Pain Score  7     Pain Location  Knee    Pain Orientation  Right;Left    Pain Descriptors / Indicators  Aching    Pain Type  Chronic pain    Pain Onset  More than a month ago         [facility] PT Assessment - 08/26/18 0001      Assessment   Medical Diagnosis  Chronic bilateral low back pain, DDD,,spinal stenosis with anterolisthesis L3-4,  generalized leg weakness.                   [facility] Adult PT Treatment/Exercise - 08/26/18 0001      Lumbar Exercises: Stretches   Passive Hamstring Stretch  Right;Left;2 reps;30 seconds    Passive Hamstring Stretch Limitations  seated    Other Lumbar Stretch Exercise  seated ball roll outs  x10 for flexion stretch      Lumbar Exercises: Aerobic   Nustep  L4 6 min UE/LE   has difficulty keeping the timer as he goes too slow or stop     Lumbar Exercises: Standing   Other Standing Lumbar Exercises  hip flexion, hip abduction, hip extension and hamstring curls X 10 bilat      Lumbar Exercises: Seated   Long Arc Quad on Chair  Right;Left;10 reps    LAQ on Chair Weights (lbs)  2    Sit to Stand Limitations  2X6, very slow and needs to push up with arms                  PT Long Term Goals - 08/18/18 1525      PT LONG TERM GOAL #1   Title  Pt will be I and compliant with HEP. (Target goal for all goals 6 weeks 09/19/18)    Status  On-going      PT LONG TERM GOAL #2   Title  Pt will improve lumbar ROM to [facility] >50% to improve mobility    Status  On-going      PT LONG TERM GOAL #3   Title  Pt will improve leg strength to at least 4+/5 MMT to improve function    Status  On-going      PT LONG TERM GOAL #4   Title  Pt will improve FOTO to less than 57% limited to show improved function    Status   On-going      PT LONG TERM GOAL #5   Title  Pt will improve 5TSTS test to less than 30 seconds to show improved balance and leg  endurance.    Baseline  43    Status  On-going            Plan - 08/26/18 1541    Clinical Impression Statement  unable to progress today due to fatigue. He was very slow with his exercises and stops mid way through them, taking many rest breaks.    Personal Factors and Comorbidities  Age;Comorbidity 1;Comorbidity 2;Comorbidity 3+;Time since onset of injury/illness/exacerbation;Past/Current Experience;Fitness    Comorbidities  PMH:AAA, Afib, CAAD, chronic LBP, GERD, CVA 09 and 2013, HTN, PVD    Examination-Activity Limitations  Bathing;Transfers;Bend;Sit;Carry;Squat;Stairs;Dressing;Stand;Lift    Examination-Participation Restrictions  Meal Prep;Cleaning;Driving;Laundry;Community Activity    Stability/Clinical Decision Making  Evolving/Moderate complexity    Rehab Potential  Good    PT Frequency  2x / week    PT Duration  6 weeks    PT Treatment/Interventions  ADLs/Self Care Home Management;Cryotherapy;Electrical Stimulation;Iontophoresis 4mg /ml Dexamethasone;Moist Heat;Traction;Ultrasound;Gait training;Stair training;Therapeutic activities;Therapeutic exercise;Neuromuscular re-education;Balance training;Manual techniques;Passive range of motion;Energy conservation;Taping;Joint Manipulations    PT Next Visit Plan  needs lumbar flexion based stretching for stenosis, needs LE stretching and strengthening, standing and walking tolerance, balance, consider 3-6 min walk test    PT Home Exercise Plan  HSS, SKTC, supine marches, sit to stand, seated flex stretch    Consulted and Agree with Plan of Care  Patient       Patient will benefit from skilled therapeutic intervention in order to improve the following deficits and impairments:  Abnormal gait, Decreased activity tolerance, Decreased balance, Decreased endurance, Decreased mobility, Decreased range of motion,  Decreased strength, Difficulty walking, Hypomobility, Impaired flexibility, Postural dysfunction, Improper body mechanics, Pain  Visit Diagnosis: 1. Chronic bilateral low back pain without sciatica   2. Muscle weakness (generalized)   3. Other abnormalities of gait and mobility        Problem List Patient Active Problem List   Diagnosis Date Noted  . Muscle weakness (generalized) 04/08/2018  . Chronic pain of right knee 03/07/2017  . Chronic low back pain without sciatica 03/07/2017  . Urethral stricture due to infection 01/17/2017  . Nonischemic cardiomyopathy (HCC) 09/25/2016  . Chest tightness 09/05/2016  . Shortness of breath 09/05/2016  . Bradycardia 08/27/2016  . Microalbuminuria 07/26/2015  . Physical deconditioning 04/26/2015  . Paroxysmal atrial fibrillation (HCC)   . History of CVA (cerebrovascular accident) without residual deficits   . Type 2 diabetes mellitus without complication, without long-term current use of insulin (HCC)   . Acute blood loss anemia   . Coronary artery disease involving native coronary artery of native heart without angina pectoris   . Stricture, urethra   . Chronic kidney disease (CKD) 03/16/2015  . Depression 03/16/2015  . Achalasia   . Feeling weak   . Malnutrition of moderate degree 01/25/2015  . Weight loss, unintentional   . Dehydration   . Bladder outlet obstruction 01/20/2015  . Balance problem   . Bilateral wrist pain 09/09/2014  . Decreased visual acuity 04/08/2014  . Polyuria 03/18/2014  . GERD (gastroesophageal reflux disease) 10/21/2013  . AAA (abdominal aortic aneurysm) without rupture (HCC) 06/24/2013  . PVD (peripheral vascular disease) (HCC) 06/24/2013  . Dizziness 03/18/2013  . Hearing loss of both ears 03/18/2013  . BPH (benign prostatic hyperplasia) 02/16/2013  . Urethral stricture 02/16/2013  . Preop cardiovascular exam 02/16/2013  . Other malaise and fatigue 07/17/2012  . Neuropathic pain of both legs  05/10/2012  . CVA (cerebral infarction) 01/10/2012  . TIA (transient ischemic attack) 01/08/2012  . Knee pain, bilateral  04/09/2011  . Urinary frequency 04/09/2011  . BACK PAIN, LUMBAR, CHRONIC 11/27/2007  . Hyperlipidemia 02/05/2007  . Overweight(278.02) 06/04/2006  . Essential hypertension 03/27/2006  . IMPOTENCE, ORGANIC 03/27/2006    Brian Burns 08/26/2018, 3:43 PM  [facility] [address] [place], [place], [address] Phone: [phone]   Fax:  [phone]  Name: Brian Burns MRN: [id number] Date of Birth: July 04, 1945

## 2018-09-01 ENCOUNTER — Other Ambulatory Visit: Payer: Self-pay

## 2018-09-01 ENCOUNTER — Ambulatory Visit: Payer: Medicare Other | Attending: Family Medicine | Admitting: Physical Therapy

## 2018-09-01 DIAGNOSIS — M545 Low back pain: Secondary | ICD-10-CM | POA: Diagnosis not present

## 2018-09-01 DIAGNOSIS — R2689 Other abnormalities of gait and mobility: Secondary | ICD-10-CM | POA: Diagnosis not present

## 2018-09-01 DIAGNOSIS — M6281 Muscle weakness (generalized): Secondary | ICD-10-CM | POA: Insufficient documentation

## 2018-09-01 DIAGNOSIS — G8929 Other chronic pain: Secondary | ICD-10-CM | POA: Insufficient documentation

## 2018-09-01 NOTE — Therapy (Signed)
[facility] [address] [place], [place], [address] Phone: [phone]   Fax:  [phone]  Physical Therapy Treatment  Patient Details  Name: Brian Burns L Holland MRN: [id number] Date of Birth: 1945-06-29 Referring Provider (PT): Doreene ElandEniola, Kehinde T, MD resident, Freddrick MarchAmin, Yashika, MD   Encounter Date: 09/01/2018  PT End of Session - 09/01/18 0928    Visit Number  7    Number of Visits  12    Date for PT Re-Evaluation  09/19/18    [facility]  [facility]    PT Start Time  [phone]    PT Stop Time  0920    PT Time Calculation (min)  45 min    Activity Tolerance  Patient limited by fatigue    Behavior During Therapy  [facility] for tasks assessed/performed       Past Medical History:  Diagnosis Date  . AAA (abdominal aortic aneurysm) without rupture (HCC) 06/2014   no change in last exam from 2015  . Arthritis    knees,lower back  . Atrial fibrillation (HCC)   . Atrial flutter with rapid ventricular response (HCC) 01/16/2015  . CAD (coronary artery disease)    pt denies  . Carotid artery disease (HCC)   . Chronic back pain   . Dyspnea   . Erectile dysfunction   . GERD (gastroesophageal reflux disease)   . Headache    hx of  . History of CVA (cerebrovascular accident)    2009  &  2013  . HLD (hyperlipidemia)   . Hypertension   . Idiopathic pancreatitis 12/2014   acute  . Pre-diabetes    per pt. no meds  . PVD (peripheral vascular disease) with claudication [facility](HCC)     Past Surgical History:  Procedure Laterality Date  . back injections     [facility]  . CARDIOVASCULAR STRESS TEST  08-09-2009   mild global hypokinesis,  no ischemia or scar/  ef 41%  . CATARACT EXTRACTION W/ INTRAOCULAR LENS  IMPLANT, BILATERAL  april and may 2014  . CYSTOSCOPY N/A 01/17/2017   Procedure: CYSTOSCOPY FLEXIBLE;  Surgeon: Crist FatHerrick, Benjamin W, MD;  Location: [facility];  Service: Urology;  Laterality: N/A;  . CYSTOSCOPY WITH RETROGRADE  URETHROGRAM N/A 11/24/2014   Procedure: CYSTOSCOPY WITH RETROGRADE URETHROGRAM;  Surgeon: Crist FatBenjamin W Herrick, MD;  [facility];  Service: Urology;  Laterality: N/A;  . CYSTOSCOPY WITH URETHRAL DILATATION N/A 11/24/2014   Procedure: CYSTOSCOPY WITH URETHRAL DILATATION;  Surgeon: Crist FatBenjamin W Herrick, MD;  [facility];  Service: Urology;  Laterality: N/A;  BALLOON DILATION        . ESOPHAGOGASTRODUODENOSCOPY N/A 05/15/2014   Procedure: ESOPHAGOGASTRODUODENOSCOPY (EGD);  Surgeon: Carman ChingJames Edwards, MD;  [facility];  Service: Endoscopy;  Laterality: N/A;  . ESOPHAGOGASTRODUODENOSCOPY Left 02/02/2015   Procedure: ESOPHAGOGASTRODUODENOSCOPY (EGD);  Surgeon: Dorena CookeyJohn Hayes, MD;  [facility];  Service: Endoscopy;  Laterality: Left;  . ESOPHAGOGASTRODUODENOSCOPY Left 03/16/2015   Procedure: ESOPHAGOGASTRODUODENOSCOPY (EGD);  Surgeon: Dorena CookeyJohn Hayes, MD;  [facility];  Service: Endoscopy;  Laterality: Left;  . TRANSTHORACIC ECHOCARDIOGRAM  01-07-2012   mild to moderate dilated LV,  ef 45-50%,  mild basal hypokinesis,  grade I diastolic  dysfunction/  trivial AR/  mild MR  . URETHROPLASTY N/A 01/17/2017   Procedure: URETHEROPLASTY BUCCAL GRAFT AND BUCCAL MUCOSA GRAFT HARVEST;  Surgeon: Crist FatHerrick, Benjamin W, MD;  [facility];  Service: Urology;  Laterality: N/A;    There were no vitals filed  for this visit.  Subjective Assessment - 09/01/18 0902    Subjective  Denies back pain but having "terrible stiffness", when asked what he did all weekend relays "not much of anything". He was encouraged to stretch more at home    Pertinent History  PMH:AAA, Afib, CAAD, chronic LBP, GERD, CA 09 and 2013, HTN, PVD    Limitations  Sitting;Lifting;Standing;Walking;House hold activities    How long can you sit comfortably?  10    How long can you stand comfortably?  5 min    How long can you walk comfortably?  50 ft    Diagnostic tests  MRI  showed L3-L4 spinal stenosis with 2 mm of anterolisthesis due to OA.  L4-L5 had endplate osteophytes and shallow protrusion of the disc. Asymmetric foraminal stenosis on the left because of encroachment by osteophyte and bulging disc material could affect the left L4 nerve    Currently in Pain?  No/denies    Pain Onset  More than a month ago                       [facility] Adult PT Treatment/Exercise - 09/01/18 0001      Bed Mobility   Bed Mobility  --      Ambulation/Gait   Gait Comments  ambulating 75 ft from waiting room to gym and back, slow speed, some unsteadiness, reaches for objects when he can.       Lumbar Exercises: Stretches   Passive Hamstring Stretch  Right;Left;2 reps;30 seconds    Passive Hamstring Stretch Limitations  seated    Other Lumbar Stretch Exercise  seated flexion stretch 5 sec X 15 using roller stool      Lumbar Exercises: Aerobic   Nustep  L4 6 min UE/LE      Lumbar Exercises: Standing   Row  20 reps    Theraband Level (Row)  Level 2 (Red)    Other Standing Lumbar Exercises  hip ext and H.S. curls  X10 bilat      Lumbar Exercises: Seated   Long Arc Quad on Chair  Left;Right;15 reps    LAQ on Chair Weights (lbs)  3    Sit to Stand Limitations  3X5, very slow and needs to push up with arms                  PT Long Term Goals - 08/18/18 1525      PT LONG TERM GOAL #1   Title  Pt will be I and compliant with HEP. (Target goal for all goals 6 weeks 09/19/18)    Status  On-going      PT LONG TERM GOAL #2   Title  Pt will improve lumbar ROM to [facility] >50% to improve mobility    Status  On-going      PT LONG TERM GOAL #3   Title  Pt will improve leg strength to at least 4+/5 MMT to improve function    Status  On-going      PT LONG TERM GOAL #4   Title  Pt will improve FOTO to less than 57% limited to show improved function    Status  On-going      PT LONG TERM GOAL #5   Title  Pt will improve 5TSTS test to less than 30 seconds  to show improved balance and leg endurance.    Baseline  43    Status  On-going  Plan - 09/01/18 0929    Clinical Impression Statement  He is still very slow with exercises and stops mid way, needing cuing to continue. He was however able to increase weight for LAQ and increase reps with sit to stands. PT will continue to progress as able.    Personal Factors and Comorbidities  Age;Comorbidity 1;Comorbidity 2;Comorbidity 3+;Time since onset of injury/illness/exacerbation;Past/Current Experience;Fitness    Comorbidities  PMH:AAA, Afib, CAAD, chronic LBP, GERD, CVA 09 and 2013, HTN, PVD    Examination-Activity Limitations  Bathing;Transfers;Bend;Sit;Carry;Squat;Stairs;Dressing;Stand;Lift    Examination-Participation Restrictions  Meal Prep;Cleaning;Driving;Laundry;Community Activity    Stability/Clinical Decision Making  Evolving/Moderate complexity    Rehab Potential  Good    PT Frequency  2x / week    PT Duration  6 weeks    PT Treatment/Interventions  ADLs/Self Care Home Management;Cryotherapy;Electrical Stimulation;Iontophoresis 4mg /ml Dexamethasone;Moist Heat;Traction;Ultrasound;Gait training;Stair training;Therapeutic activities;Therapeutic exercise;Neuromuscular re-education;Balance training;Manual techniques;Passive range of motion;Energy conservation;Taping;Joint Manipulations    PT Next Visit Plan  needs lumbar flexion based stretching for stenosis, needs LE stretching and strengthening, standing and walking tolerance, balance, consider 3-6 min walk test    PT Home Exercise Plan  HSS, SKTC, supine marches, sit to stand, seated flex stretch    Consulted and Agree with Plan of Care  Patient       Patient will benefit from skilled therapeutic intervention in order to improve the following deficits and impairments:  Abnormal gait, Decreased activity tolerance, Decreased balance, Decreased endurance, Decreased mobility, Decreased range of motion, Decreased strength,  Difficulty walking, Hypomobility, Impaired flexibility, Postural dysfunction, Improper body mechanics, Pain  Visit Diagnosis: 1. Chronic bilateral low back pain without sciatica   2. Muscle weakness (generalized)   3. Other abnormalities of gait and mobility        Problem List Patient Active Problem List   Diagnosis Date Noted  . Muscle weakness (generalized) 04/08/2018  . Chronic pain of right knee 03/07/2017  . Chronic low back pain without sciatica 03/07/2017  . Urethral stricture due to infection 01/17/2017  . Nonischemic cardiomyopathy (HCC) 09/25/2016  . Chest tightness 09/05/2016  . Shortness of breath 09/05/2016  . Bradycardia 08/27/2016  . Microalbuminuria 07/26/2015  . Physical deconditioning 04/26/2015  . Paroxysmal atrial fibrillation (HCC)   . History of CVA (cerebrovascular accident) without residual deficits   . Type 2 diabetes mellitus without complication, without long-term current use of insulin (HCC)   . Acute blood loss anemia   . Coronary artery disease involving native coronary artery of native heart without angina pectoris   . Stricture, urethra   . Chronic kidney disease (CKD) 03/16/2015  . Depression 03/16/2015  . Achalasia   . Feeling weak   . Malnutrition of moderate degree 01/25/2015  . Weight loss, unintentional   . Dehydration   . Bladder outlet obstruction 01/20/2015  . Balance problem   . Bilateral wrist pain 09/09/2014  . Decreased visual acuity 04/08/2014  . Polyuria 03/18/2014  . GERD (gastroesophageal reflux disease) 10/21/2013  . AAA (abdominal aortic aneurysm) without rupture (HCC) 06/24/2013  . PVD (peripheral vascular disease) (HCC) 06/24/2013  . Dizziness 03/18/2013  . Hearing loss of both ears 03/18/2013  . BPH (benign prostatic hyperplasia) 02/16/2013  . Urethral stricture 02/16/2013  . Preop cardiovascular exam 02/16/2013  . Other malaise and fatigue 07/17/2012  . Neuropathic pain of both legs 05/10/2012  . CVA (cerebral  infarction) 01/10/2012  . TIA (transient ischemic attack) 01/08/2012  . Knee pain, bilateral 04/09/2011  . Urinary frequency 04/09/2011  . BACK PAIN,  LUMBAR, CHRONIC 11/27/2007  . Hyperlipidemia 02/05/2007  . Overweight(278.02) 06/04/2006  . Essential hypertension 03/27/2006  . IMPOTENCE, ORGANIC 03/27/2006    Silvestre Mesi 09/01/2018, 9:31 AM  [facility] [address] [place], [place], [address] Phone: [phone]   Fax:  [phone]  Name: MAUI BRITTEN MRN: [id number] Date of Birth: 1945/06/10

## 2018-09-02 DIAGNOSIS — R2689 Other abnormalities of gait and mobility: Secondary | ICD-10-CM | POA: Insufficient documentation

## 2018-09-03 ENCOUNTER — Ambulatory Visit: Payer: Medicare Other | Admitting: Physical Therapy

## 2018-09-03 ENCOUNTER — Other Ambulatory Visit: Payer: Self-pay

## 2018-09-03 DIAGNOSIS — G8929 Other chronic pain: Secondary | ICD-10-CM | POA: Diagnosis not present

## 2018-09-03 DIAGNOSIS — M6281 Muscle weakness (generalized): Secondary | ICD-10-CM | POA: Diagnosis not present

## 2018-09-03 DIAGNOSIS — M545 Low back pain: Secondary | ICD-10-CM | POA: Diagnosis not present

## 2018-09-03 DIAGNOSIS — R2689 Other abnormalities of gait and mobility: Secondary | ICD-10-CM

## 2018-09-03 NOTE — Therapy (Signed)
[place], [place], [address] Phone: [phone]   Fax:  [phone]  Physical Therapy Treatment  Patient Details  Name: Brian Burns MRN: [id number] Date of Birth: 1945-07-17 Referring Provider (PT): Kinnie Feil, MD resident, Lovenia Kim, MD   Encounter Date: 09/03/2018  PT End of Session - 09/03/18 1555    Visit Number  8    Number of Visits  12    Date for PT Re-Evaluation  09/19/18    Authorization Type  [employer] MCR/MCD    PT Start Time  2376    PT Stop Time  1530    PT Time Calculation (min)  45 min    Activity Tolerance  Patient limited by fatigue    Behavior During Therapy  [facility] for tasks assessed/performed       Past Medical History:  Diagnosis Date  . AAA (abdominal aortic aneurysm) without rupture ([place]) 06/2014   no change in last exam from 2015  . Arthritis    knees,lower back  . Atrial fibrillation ([place])   . Atrial flutter with rapid ventricular response ([place]) 01/16/2015  . CAD (coronary artery disease)    pt denies  . Carotid artery disease ([place])   . Chronic back pain   . Dyspnea   . Erectile dysfunction   . GERD (gastroesophageal reflux disease)   . Headache    hx of  . History of CVA (cerebrovascular accident)    2009  &  2013  . HLD (hyperlipidemia)   . Hypertension   . Idiopathic pancreatitis 12/2014   acute  . Pre-diabetes    per pt. no meds  . PVD (peripheral vascular disease) with claudication [facility])     Past Surgical History:  Procedure Laterality Date  . back injections     [employer]  . CARDIOVASCULAR STRESS TEST  08-09-2009   mild global hypokinesis,  no ischemia or scar/  ef 41%  . CATARACT EXTRACTION W/ INTRAOCULAR LENS  IMPLANT, BILATERAL  april and may 2014  . CYSTOSCOPY N/A 01/17/2017   Procedure: CYSTOSCOPY FLEXIBLE;  Surgeon: Ardis Hughs, MD;  Location: [facility];  Service: Urology;  Laterality: N/A;  . CYSTOSCOPY WITH RETROGRADE  URETHROGRAM N/A 11/24/2014   Procedure: CYSTOSCOPY WITH RETROGRADE URETHROGRAM;  Surgeon: Ardis Hughs, MD;  Location[facility];  Service: Urology;  Laterality: N/A;  . CYSTOSCOPY WITH URETHRAL DILATATION N/A 11/24/2014   Procedure: CYSTOSCOPY WITH URETHRAL DILATATION;  Surgeon: Ardis Hughs, MD;  Location: [facility];  Service: Urology;  Laterality: N/A;  BALLOON DILATION        . ESOPHAGOGASTRODUODENOSCOPY N/A 05/15/2014   Procedure: ESOPHAGOGASTRODUODENOSCOPY (EGD);  Surgeon: Laurence Spates, MD;  [facility];  Service: Endoscopy;  Laterality: N/A;  . ESOPHAGOGASTRODUODENOSCOPY Left 02/02/2015   Procedure: ESOPHAGOGASTRODUODENOSCOPY (EGD);  Surgeon: Teena Irani, MD;  Location: [facility];  Service: Endoscopy;  Laterality: Left;  . ESOPHAGOGASTRODUODENOSCOPY Left 03/16/2015   Procedure: ESOPHAGOGASTRODUODENOSCOPY (EGD);  Surgeon: Teena Irani, MD;  Location: [facility];  Service: Endoscopy;  Laterality: Left;  . TRANSTHORACIC ECHOCARDIOGRAM  01-07-2012   mild to moderate dilated LV,  ef 45-50%,  mild basal hypokinesis,  grade I diastolic  dysfunction/  trivial AR/  mild MR  . URETHROPLASTY N/A 01/17/2017   Procedure: URETHEROPLASTY BUCCAL GRAFT AND BUCCAL MUCOSA GRAFT HARVEST;  Surgeon: Ardis Hughs, MD;  Location: [facility];  Service: Urology;  Laterality: N/A;    There were no vitals filed  for this visit.      [facility] PT Assessment - 09/03/18 0001      Assessment   Medical Diagnosis  Chronic bilateral low back pain, DDD,,spinal stenosis with anterolisthesis L3-4,  generalized leg weakness.    Referring Provider (PT)  Doreene Eland, MD resident, Freddrick March, MD      AROM   Lumbar Flexion  WNL    Lumbar Extension  few degrees past neutral    Lumbar - Right Side Bend  60%    Lumbar - Left Side Bend  60%    Lumbar - Right Rotation  50%    Lumbar - Left Rotation  50%      Strength   Overall Strength Comments  knee  strength 5-/5 MMT bilat, hip strength grossly 4+/5 MMT tested in sitting                   [facility] Adult PT Treatment/Exercise - 09/03/18 0001      Ambulation/Gait   Gait Comments  ambulating 75 ft from waiting room to gym and back, slow speed, some unsteadiness, reaches for objects when he can.       Lumbar Exercises: Stretches   Passive Hamstring Stretch  Right;Left;2 reps;30 seconds    Passive Hamstring Stretch Limitations  seated    Other Lumbar Stretch Exercise  seated flexion stretch 5 sec X 15 using roller stool      Lumbar Exercises: Aerobic   Nustep  L4 6 min UE/LE      Lumbar Exercises: Standing   Row  20 reps    Theraband Level (Row)  Level 2 (Red)    Other Standing Lumbar Exercises  sidestepping at counter no UE support up/down X 3      Lumbar Exercises: Seated   Long Arc Quad on Chair  Left;Right;15 reps    LAQ on Chair Weights (lbs)  3    Sit to Stand Limitations  3X5, very slow and needs to push up with arms     Seated marches      3 lbs  X15 each leg              PT Long Term Goals - 08/18/18 1525      PT LONG TERM GOAL #1   Title  Pt will be I and compliant with HEP. (Target goal for all goals 6 weeks 09/19/18)    Status  On-going      PT LONG TERM GOAL #2   Title  Pt will improve lumbar ROM to [facility] >50% to improve mobility    Status  On-going      PT LONG TERM GOAL #3   Title  Pt will improve leg strength to at least 4+/5 MMT to improve function    Status  On-going      PT LONG TERM GOAL #4   Title  Pt will improve FOTO to less than 57% limited to show improved function    Status  On-going      PT LONG TERM GOAL #5   Title  Pt will improve 5TSTS test to less than 30 seconds to show improved balance and leg endurance.    Baseline  43    Status  On-going            Plan - 09/03/18 1600    Clinical Impression Statement  Added in sidestepping funcitonal balance exercise today without UE support and he did okay with this.  Pain was a little  less today. Overall he has increased lumbar ROM some and leg strength see updated measurements, but he continues to limited with ambulation and endurance    Personal Factors and Comorbidities  Age;Comorbidity 1;Comorbidity 2;Comorbidity 3+;Time since onset of injury/illness/exacerbation;Past/Current Experience;Fitness    Comorbidities  PMH:AAA, Afib, CAAD, chronic LBP, GERD, CVA 09 and 2013, HTN, PVD    Examination-Activity Limitations  Bathing;Transfers;Bend;Sit;Carry;Squat;Stairs;Dressing;Stand;Lift    Examination-Participation Restrictions  Meal Prep;Cleaning;Driving;Laundry;Community Activity    Stability/Clinical Decision Making  Evolving/Moderate complexity    Rehab Potential  Good    PT Frequency  2x / week    PT Duration  6 weeks    PT Treatment/Interventions  ADLs/Self Care Home Management;Cryotherapy;Electrical Stimulation;Iontophoresis 4mg /ml Dexamethasone;Moist Heat;Traction;Ultrasound;Gait training;Stair training;Therapeutic activities;Therapeutic exercise;Neuromuscular re-education;Balance training;Manual techniques;Passive range of motion;Energy conservation;Taping;Joint Manipulations    PT Next Visit Plan  needs lumbar flexion based stretching for stenosis, needs LE stretching and strengthening, standing and walking tolerance, balance, consider 3-6 min walk test    PT Home Exercise Plan  HSS, SKTC, supine marches, sit to stand, seated flex stretch    Consulted and Agree with Plan of Care  Patient       Patient will benefit from skilled therapeutic intervention in order to improve the following deficits and impairments:  Abnormal gait, Decreased activity tolerance, Decreased balance, Decreased endurance, Decreased mobility, Decreased range of motion, Decreased strength, Difficulty walking, Hypomobility, Impaired flexibility, Postural dysfunction, Improper body mechanics, Pain  Visit Diagnosis: 1. Chronic bilateral low back pain without sciatica   2. Muscle  weakness (generalized)   3. Other abnormalities of gait and mobility        Problem List Patient Active Problem List   Diagnosis Date Noted  . Muscle weakness (generalized) 04/08/2018  . Chronic pain of right knee 03/07/2017  . Chronic low back pain without sciatica 03/07/2017  . Urethral stricture due to infection 01/17/2017  . Nonischemic cardiomyopathy (HCC) 09/25/2016  . Chest tightness 09/05/2016  . Shortness of breath 09/05/2016  . Bradycardia 08/27/2016  . Microalbuminuria 07/26/2015  . Physical deconditioning 04/26/2015  . Paroxysmal atrial fibrillation (HCC)   . History of CVA (cerebrovascular accident) without residual deficits   . Type 2 diabetes mellitus without complication, without long-term current use of insulin (HCC)   . Acute blood loss anemia   . Coronary artery disease involving native coronary artery of native heart without angina pectoris   . Stricture, urethra   . Chronic kidney disease (CKD) 03/16/2015  . Depression 03/16/2015  . Achalasia   . Feeling weak   . Malnutrition of moderate degree 01/25/2015  . Weight loss, unintentional   . Dehydration   . Bladder outlet obstruction 01/20/2015  . Balance problem   . Bilateral wrist pain 09/09/2014  . Decreased visual acuity 04/08/2014  . Polyuria 03/18/2014  . GERD (gastroesophageal reflux disease) 10/21/2013  . AAA (abdominal aortic aneurysm) without rupture (HCC) 06/24/2013  . PVD (peripheral vascular disease) (HCC) 06/24/2013  . Dizziness 03/18/2013  . Hearing loss of both ears 03/18/2013  . BPH (benign prostatic hyperplasia) 02/16/2013  . Urethral stricture 02/16/2013  . Preop cardiovascular exam 02/16/2013  . Other malaise and fatigue 07/17/2012  . Neuropathic pain of both legs 05/10/2012  . CVA (cerebral infarction) 01/10/2012  . TIA (transient ischemic attack) 01/08/2012  . Knee pain, bilateral 04/09/2011  . Urinary frequency 04/09/2011  . BACK PAIN, LUMBAR, CHRONIC 11/27/2007  .  Hyperlipidemia 02/05/2007  . Overweight(278.02) 06/04/2006  . Essential hypertension 03/27/2006  . IMPOTENCE, ORGANIC 03/27/2006    Arlys JohnBrian  R Johnell Landowski,PT,DPT 09/03/2018, 4:16 PM  [facility] [address] [place], [place], [address] Phone: [phone]   Fax:  [phone]  Name: Leanord Asalhomas L Aaberg MRN: [id number] Date of Birth: [birth year]

## 2018-09-08 ENCOUNTER — Ambulatory Visit: Payer: Medicare Other | Admitting: Physical Therapy

## 2018-09-08 ENCOUNTER — Other Ambulatory Visit: Payer: Self-pay

## 2018-09-08 ENCOUNTER — Encounter: Payer: Self-pay | Admitting: Physical Therapy

## 2018-09-08 DIAGNOSIS — R2689 Other abnormalities of gait and mobility: Secondary | ICD-10-CM | POA: Diagnosis not present

## 2018-09-08 DIAGNOSIS — M545 Low back pain: Secondary | ICD-10-CM | POA: Diagnosis not present

## 2018-09-08 DIAGNOSIS — G8929 Other chronic pain: Secondary | ICD-10-CM

## 2018-09-08 DIAGNOSIS — M6281 Muscle weakness (generalized): Secondary | ICD-10-CM

## 2018-09-08 NOTE — Therapy (Signed)
[facility] [address] [place], [place], [address] Phone: [phone]   Fax:  [phone]  Physical Therapy Treatment  Patient Details  Name: Brian Burns MRN: [id number] Date of Birth: 19-Aug-1945 Referring Provider (PT): Doreene ElandEniola, Kehinde T, MD resident, Freddrick MarchAmin, Yashika, MD   Encounter Date: 09/08/2018  PT End of Session - 09/08/18 0849    Visit Number  9    Number of Visits  12    Date for PT Re-Evaluation  09/19/18    Authorization Type  [employer] MCR/MCD    PT Start Time  [phone]    PT Stop Time  0915    PT Time Calculation (min)  40 min    Activity Tolerance  Patient limited by fatigue    Behavior During Therapy  [facility] for tasks assessed/performed       Past Medical History:  Diagnosis Date  . AAA (abdominal aortic aneurysm) without rupture (HCC) 06/2014   no change in last exam from 2015  . Arthritis    knees,lower back  . Atrial fibrillation (HCC)   . Atrial flutter with rapid ventricular response (HCC) 01/16/2015  . CAD (coronary artery disease)    pt denies  . Carotid artery disease (HCC)   . Chronic back pain   . Dyspnea   . Erectile dysfunction   . GERD (gastroesophageal reflux disease)   . Headache    hx of  . History of CVA (cerebrovascular accident)    2009  &  2013  . HLD (hyperlipidemia)   . Hypertension   . Idiopathic pancreatitis 12/2014   acute  . Pre-diabetes    per pt. no meds  . PVD (peripheral vascular disease) with claudication [facility](HCC)     Past Surgical History:  Procedure Laterality Date  . back injections     [facility]  . CARDIOVASCULAR STRESS TEST  08-09-2009   mild global hypokinesis,  no ischemia or scar/  ef 41%  . CATARACT EXTRACTION W/ INTRAOCULAR LENS  IMPLANT, BILATERAL  april and may 2014  . CYSTOSCOPY N/A 01/17/2017   Procedure: CYSTOSCOPY FLEXIBLE;  Surgeon: Crist FatHerrick, Benjamin W, MD;  Location: [facility];  Service: Urology;  Laterality: N/A;  . CYSTOSCOPY WITH RETROGRADE  URETHROGRAM N/A 11/24/2014   Procedure: CYSTOSCOPY WITH RETROGRADE URETHROGRAM;  Surgeon: Crist FatBenjamin W Herrick, MD;  [facility] [place];  Service: Urology;  Laterality: N/A;  . CYSTOSCOPY WITH URETHRAL DILATATION N/A 11/24/2014   Procedure: CYSTOSCOPY WITH URETHRAL DILATATION;  Surgeon: Crist FatBenjamin W Herrick, MD;  [facility] ;  Service: Urology;  Laterality: N/A;  BALLOON DILATION        . ESOPHAGOGASTRODUODENOSCOPY N/A 05/15/2014   Procedure: ESOPHAGOGASTRODUODENOSCOPY (EGD);  Surgeon: Carman ChingJames Edwards, MD;  [facility];  Service: Endoscopy;  Laterality: N/A;  . ESOPHAGOGASTRODUODENOSCOPY Left 02/02/2015   Procedure: ESOPHAGOGASTRODUODENOSCOPY (EGD);  Surgeon: Dorena CookeyJohn Hayes, MD;  [facility];  Service: Endoscopy;  Laterality: Left;  . ESOPHAGOGASTRODUODENOSCOPY Left 03/16/2015   Procedure: ESOPHAGOGASTRODUODENOSCOPY (EGD);  Surgeon: Dorena CookeyJohn Hayes, MD;  [facility];  Service: Endoscopy;  Laterality: Left;  . TRANSTHORACIC ECHOCARDIOGRAM  01-07-2012   mild to moderate dilated LV,  ef 45-50%,  mild basal hypokinesis,  grade I diastolic  dysfunction/  trivial AR/  mild MR  . URETHROPLASTY N/A 01/17/2017   Procedure: URETHEROPLASTY BUCCAL GRAFT AND BUCCAL MUCOSA GRAFT HARVEST;  Surgeon: Crist FatHerrick, Benjamin W, MD;  Location: [facility];  Service: Urology;  Laterality: N/A;    There were no vitals filed  for this visit.  Subjective Assessment - 09/08/18 0848    Subjective  Relays he feels good today and no pain however he had a hard time walking yesterday due to his "leg giving out on him"    Pertinent History  PMH:AAA, Afib, CAAD, chronic LBP, GERD, CA 09 and 2013, HTN, PVD    Limitations  Sitting;Lifting;Standing;Walking;House hold activities    How long can you sit comfortably?  10    How long can you stand comfortably?  5 min    How long can you walk comfortably?  50 ft    Diagnostic tests  MRI showed L3-L4 spinal stenosis with 2 mm of  anterolisthesis due to OA.  L4-L5 had endplate osteophytes and shallow protrusion of the disc. Asymmetric foraminal stenosis on the left because of encroachment by osteophyte and bulging disc material could affect the left L4 nerve    Currently in Pain?  No/denies    Pain Onset  More than a month ago                       [facility]/Exercise - 09/08/18 0001      Ambulation/Gait   Gait Comments  ambulating 75 ft from waiting room to gym and back, slow speed, some unsteadiness, reaches for objects when he can.       Lumbar Exercises: Stretches   Passive Hamstring Stretch  Right;Left;2 reps;30 seconds    Passive Hamstring Stretch Limitations  seated    Other Lumbar Stretch Exercise  seated flexion stretch 5 sec X 15 using roller stool      Lumbar Exercises: Aerobic   Nustep  L5 6 min UE/LE      Lumbar Exercises: Standing   Row  20 reps    Theraband Level (Row)  Level 3 (Green)    Shoulder Extension  20 reps    Theraband Level (Shoulder Extension)  Level 3 (Green)    Other Standing Lumbar Exercises  balance exercises of feet together, feet apart with head turns, and feet apart with eyes closed, needs supervision to min A with balance      Lumbar Exercises: Seated   Long Arc Quad on Chair  Left;Right;15 reps    LAQ on Chair Weights (lbs)  3    Sit to Stand Limitations  3X5, very slow and needs to push up with arms                  PT Long Term Goals - 08/18/18 1525      PT LONG TERM GOAL #1   Title  Pt will be I and compliant with HEP. (Target goal for all goals 6 weeks 09/19/18)    Status  On-going      PT LONG TERM GOAL #2   Title  Pt will improve lumbar ROM to [facility] >50% to improve mobility    Status  On-going      PT LONG TERM GOAL #3   Title  Pt will improve leg strength to at least 4+/5 MMT to improve function    Status  On-going      PT LONG TERM GOAL #4   Title  Pt will improve FOTO to less than 57% limited to show improved  function    Status  On-going      PT LONG TERM GOAL #5   Title  Pt will improve 5TSTS test to less than 30 seconds to show improved balance and leg endurance.  Baseline  43    Status  On-going            Plan - 09/08/18 0925    Clinical Impression Statement  limited by fatigue and stops during exercises and needs max encouragement to continue. Added more balance training today as he continues to be unsteady on his feet. He is making very slow progress and will need MCR progress note next visit.    Personal Factors and Comorbidities  Age;Comorbidity 1;Comorbidity 2;Comorbidity 3+;Time since onset of injury/illness/exacerbation;Past/Current Experience;Fitness    Comorbidities  PMH:AAA, Afib, CAAD, chronic LBP, GERD, CVA 09 and 2013, HTN, PVD    Examination-Activity Limitations  Bathing;Transfers;Bend;Sit;Carry;Squat;Stairs;Dressing;Stand;Lift    Examination-Participation Restrictions  Meal Prep;Cleaning;Driving;Laundry;Community Activity    Stability/Clinical Decision Making  Evolving/Moderate complexity    Rehab Potential  Good    PT Frequency  2x / week    PT Duration  6 weeks    PT Treatment/Interventions  ADLs/Self Care Home Management;Cryotherapy;Electrical Stimulation;Iontophoresis 4mg /ml Dexamethasone;Moist Heat;Traction;Ultrasound;Gait training;Stair training;Therapeutic activities;Therapeutic exercise;Neuromuscular re-education;Balance training;Manual techniques;Passive range of motion;Energy conservation;Taping;Joint Manipulations    PT Next Visit Plan  needs lumbar flexion based stretching for stenosis, needs LE stretching and strengthening, standing and walking tolerance, balance, consider 3-6 min walk test    PT Home Exercise Plan  HSS, SKTC, supine marches, sit to stand, seated flex stretch    Consulted and Agree with Plan of Care  Patient       Patient will benefit from skilled therapeutic intervention in order to improve the following deficits and impairments:   Abnormal gait, Decreased activity tolerance, Decreased balance, Decreased endurance, Decreased mobility, Decreased range of motion, Decreased strength, Difficulty walking, Hypomobility, Impaired flexibility, Postural dysfunction, Improper body mechanics, Pain  Visit Diagnosis: 1. Chronic bilateral low back pain without sciatica   2. Muscle weakness (generalized)   3. Other abnormalities of gait and mobility        Problem List Patient Active Problem List   Diagnosis Date Noted  . Muscle weakness (generalized) 04/08/2018  . Chronic pain of right knee 03/07/2017  . Chronic low back pain without sciatica 03/07/2017  . Urethral stricture due to infection 01/17/2017  . Nonischemic cardiomyopathy (HCC) 09/25/2016  . Chest tightness 09/05/2016  . Shortness of breath 09/05/2016  . Bradycardia 08/27/2016  . Microalbuminuria 07/26/2015  . Physical deconditioning 04/26/2015  . Paroxysmal atrial fibrillation (HCC)   . History of CVA (cerebrovascular accident) without residual deficits   . Type 2 diabetes mellitus without complication, without long-term current use of insulin (HCC)   . Acute blood loss anemia   . Coronary artery disease involving native coronary artery of native heart without angina pectoris   . Stricture, urethra   . Chronic kidney disease (CKD) 03/16/2015  . Depression 03/16/2015  . Achalasia   . Feeling weak   . Malnutrition of moderate degree 01/25/2015  . Weight loss, unintentional   . Dehydration   . Bladder outlet obstruction 01/20/2015  . Balance problem   . Bilateral wrist pain 09/09/2014  . Decreased visual acuity 04/08/2014  . Polyuria 03/18/2014  . GERD (gastroesophageal reflux disease) 10/21/2013  . AAA (abdominal aortic aneurysm) without rupture (HCC) 06/24/2013  . PVD (peripheral vascular disease) (HCC) 06/24/2013  . Dizziness 03/18/2013  . Hearing loss of both ears 03/18/2013  . BPH (benign prostatic hyperplasia) 02/16/2013  . Urethral stricture  02/16/2013  . Preop cardiovascular exam 02/16/2013  . Other malaise and fatigue 07/17/2012  . Neuropathic pain of both legs 05/10/2012  . CVA (cerebral infarction)  01/10/2012  . TIA (transient ischemic attack) 01/08/2012  . Knee pain, bilateral 04/09/2011  . Urinary frequency 04/09/2011  . BACK PAIN, LUMBAR, CHRONIC 11/27/2007  . Hyperlipidemia 02/05/2007  . Overweight(278.02) 06/04/2006  . Essential hypertension 03/27/2006  . IMPOTENCE, ORGANIC 03/27/2006    Silvestre Mesi 09/08/2018, 9:28 AM  [facility] [address] [place], [place], [address] Phone: [phone]   Fax:  [phone]  Name: Brian Burns MRN: [id number] Date of Birth: May 24, 1945

## 2018-09-09 ENCOUNTER — Ambulatory Visit (INDEPENDENT_AMBULATORY_CARE_PROVIDER_SITE_OTHER): Payer: Medicare Other | Admitting: Podiatry

## 2018-09-09 ENCOUNTER — Other Ambulatory Visit: Payer: Self-pay

## 2018-09-09 ENCOUNTER — Encounter: Payer: Self-pay | Admitting: Podiatry

## 2018-09-09 DIAGNOSIS — L84 Corns and callosities: Secondary | ICD-10-CM | POA: Diagnosis not present

## 2018-09-09 DIAGNOSIS — B351 Tinea unguium: Secondary | ICD-10-CM

## 2018-09-09 DIAGNOSIS — M545 Low back pain, unspecified: Secondary | ICD-10-CM

## 2018-09-09 DIAGNOSIS — E119 Type 2 diabetes mellitus without complications: Secondary | ICD-10-CM

## 2018-09-09 DIAGNOSIS — M79675 Pain in left toe(s): Secondary | ICD-10-CM

## 2018-09-09 DIAGNOSIS — G8929 Other chronic pain: Secondary | ICD-10-CM

## 2018-09-09 DIAGNOSIS — M79674 Pain in right toe(s): Secondary | ICD-10-CM | POA: Diagnosis not present

## 2018-09-09 MED ORDER — GABAPENTIN 300 MG PO CAPS: ORAL_CAPSULE | ORAL | 2 refills | Status: DC

## 2018-09-09 NOTE — Patient Instructions (Signed)
Diabetes Mellitus and Foot Care Foot care is an important part of your health, especially when you have diabetes. Diabetes may cause you to have problems because of poor blood flow (circulation) to your feet and legs, which can cause your skin to:  Become thinner and drier.  Break more easily.  Heal more slowly.  Peel and crack. You may also have nerve damage (neuropathy) in your legs and feet, causing decreased feeling in them. This means that you may not notice minor injuries to your feet that could lead to more serious problems. Noticing and addressing any potential problems early is the best way to prevent future foot problems. How to care for your feet Foot hygiene  Wash your feet daily with warm water and mild soap. Do not use hot water. Then, pat your feet and the areas between your toes until they are completely dry. Do not soak your feet as this can dry your skin.  Trim your toenails straight across. Do not dig under them or around the cuticle. File the edges of your nails with an emery board or nail file.  Apply a moisturizing lotion or petroleum jelly to the skin on your feet and to dry, brittle toenails. Use lotion that does not contain alcohol and is unscented. Do not apply lotion between your toes. Shoes and socks  Wear clean socks or stockings every day. Make sure they are not too tight. Do not wear knee-high stockings since they may decrease blood flow to your legs.  Wear shoes that fit properly and have enough cushioning. Always look in your shoes before you put them on to be sure there are no objects inside.  To break in new shoes, wear them for just a few hours a day. This prevents injuries on your feet. Wounds, scrapes, corns, and calluses  Check your feet daily for blisters, cuts, bruises, sores, and redness. If you cannot see the bottom of your feet, use a mirror or ask someone for help.  Do not cut corns or calluses or try to remove them with medicine.  If you  find a minor scrape, cut, or break in the skin on your feet, keep it and the skin around it clean and dry. You may clean these areas with mild soap and water. Do not clean the area with peroxide, alcohol, or iodine.  If you have a wound, scrape, corn, or callus on your foot, look at it several times a day to make sure it is healing and not infected. Check for: ? Redness, swelling, or pain. ? Fluid or blood. ? Warmth. ? Pus or a bad smell. General instructions  Do not cross your legs. This may decrease blood flow to your feet.  Do not use heating pads or hot water bottles on your feet. They may burn your skin. If you have lost feeling in your feet or legs, you may not know this is happening until it is too late.  Protect your feet from hot and cold by wearing shoes, such as at the beach or on hot pavement.  Schedule a complete foot exam at least once a year (annually) or more often if you have foot problems. If you have foot problems, report any cuts, sores, or bruises to your health care provider immediately. Contact a health care provider if:  You have a medical condition that increases your risk of infection and you have any cuts, sores, or bruises on your feet.  You have an injury that is not   healing.  You have redness on your legs or feet.  You feel burning or tingling in your legs or feet.  You have pain or cramps in your legs and feet.  Your legs or feet are numb.  Your feet always feel cold.  You have pain around a toenail. Get help right away if:  You have a wound, scrape, corn, or callus on your foot and: ? You have pain, swelling, or redness that gets worse. ? You have fluid or blood coming from the wound, scrape, corn, or callus. ? Your wound, scrape, corn, or callus feels warm to the touch. ? You have pus or a bad smell coming from the wound, scrape, corn, or callus. ? You have a fever. ? You have a red line going up your leg. Summary  Check your feet every day  for cuts, sores, red spots, swelling, and blisters.  Moisturize feet and legs daily.  Wear shoes that fit properly and have enough cushioning.  If you have foot problems, report any cuts, sores, or bruises to your health care provider immediately.  Schedule a complete foot exam at least once a year (annually) or more often if you have foot problems. This information is not intended to replace advice given to you by your health care provider. Make sure you discuss any questions you have with your health care provider. Document Released: 01/12/2000 Document Revised: 02/26/2017 Document Reviewed: 02/16/2016 Elsevier Patient Education  2020 [employer].   Onychomycosis/Fungal Toenails  WHAT IS IT? An infection that lies within the keratin of your nail plate that is caused by a fungus.  WHY ME? Fungal infections affect all ages, sexes, races, and creeds.  There may be many factors that predispose you to a fungal infection such as age, coexisting medical conditions such as diabetes, or an autoimmune disease; stress, medications, fatigue, genetics, etc.  Bottom line: fungus thrives in a warm, moist environment and your shoes offer such a location.  IS IT CONTAGIOUS? Theoretically, yes.  You do not want to share shoes, nail clippers or files with someone who has fungal toenails.  Walking around barefoot in the same room or sleeping in the same bed is unlikely to transfer the organism.  It is important to realize, however, that fungus can spread easily from one nail to the next on the same foot.  HOW DO WE TREAT THIS?  There are several ways to treat this condition.  Treatment may depend on many factors such as age, medications, pregnancy, liver and kidney conditions, etc.  It is best to ask your doctor which options are available to you.  1. No treatment.   Unlike many other medical concerns, you can live with this condition.  However for many people this can be a painful condition and may lead to  ingrown toenails or a bacterial infection.  It is recommended that you keep the nails cut short to help reduce the amount of fungal nail. 2. Topical treatment.  These range from herbal remedies to prescription strength nail lacquers.  About 40-50% effective, topicals require twice daily application for approximately 9 to 12 months or until an entirely new nail has grown out.  The most effective topicals are medical grade medications available through physicians offices. 3. Oral antifungal medications.  With an 80-90% cure rate, the most common oral medication requires 3 to 4 months of therapy and stays in your system for a year as the new nail grows out.  Oral antifungal medications do require   blood work to make sure it is a safe drug for you.  A liver function panel will be performed prior to starting the medication and after the first month of treatment.  It is important to have the blood work performed to avoid any harmful side effects.  In general, this medication safe but blood work is required. 4. Laser Therapy.  This treatment is performed by applying a specialized laser to the affected nail plate.  This therapy is noninvasive, fast, and non-painful.  It is not covered by insurance and is therefore, out of pocket.  The results have been very good with a 80-95% cure rate.  [facility] is the only practice in the area to offer this therapy. 5. Permanent Nail Avulsion.  Removing the entire nail so that a new nail will not grow back. 

## 2018-09-10 ENCOUNTER — Other Ambulatory Visit: Payer: Self-pay

## 2018-09-10 ENCOUNTER — Ambulatory Visit: Payer: Medicare Other | Admitting: Physical Therapy

## 2018-09-10 DIAGNOSIS — R2689 Other abnormalities of gait and mobility: Secondary | ICD-10-CM | POA: Diagnosis not present

## 2018-09-10 DIAGNOSIS — M545 Low back pain: Secondary | ICD-10-CM | POA: Diagnosis not present

## 2018-09-10 DIAGNOSIS — M6281 Muscle weakness (generalized): Secondary | ICD-10-CM

## 2018-09-10 DIAGNOSIS — G8929 Other chronic pain: Secondary | ICD-10-CM | POA: Diagnosis not present

## 2018-09-10 NOTE — Therapy (Signed)
[place], [place], [address] Phone: [phone]   Fax:  [phone]  Physical Therapy Treatment/Progress note Progress Note reporting period 08/07/18 to 09/10/18  See below for objective and subjective measurements relating to patients progress with PT.   Patient Details  Name: Brian Burns MRN: [id number] Date of Birth: 09/21/1945 Referring Provider (PT): Kinnie Feil, MD resident, Lovenia Kim, MD   Encounter Date: 09/10/2018  PT End of Session - 09/10/18 0902    Visit Number  10    Number of Visits  12    Date for PT Re-Evaluation  09/19/18    Authorization Type  [employer] MCR/MCD    PT Start Time  0830    PT Stop Time  0915    PT Time Calculation (min)  45 min    Activity Tolerance  Patient limited by fatigue    Behavior During Therapy  [facility] for tasks assessed/performed       Past Medical History:  Diagnosis Date  . AAA (abdominal aortic aneurysm) without rupture ([place]) 06/2014   no change in last exam from 2015  . Arthritis    knees,lower back  . Atrial fibrillation ([place])   . Atrial flutter with rapid ventricular response ([place]) 01/16/2015  . CAD (coronary artery disease)    pt denies  . Carotid artery disease ([place])   . Chronic back pain   . Dyspnea   . Erectile dysfunction   . GERD (gastroesophageal reflux disease)   . Headache    hx of  . History of CVA (cerebrovascular accident)    2009  &  2013  . HLD (hyperlipidemia)   . Hypertension   . Idiopathic pancreatitis 12/2014   acute  . Pre-diabetes    per pt. no meds  . PVD (peripheral vascular disease) with claudication [facility])     Past Surgical History:  Procedure Laterality Date  . back injections     [employer]  . CARDIOVASCULAR STRESS TEST  08-09-2009   mild global hypokinesis,  no ischemia or scar/  ef 41%  . CATARACT EXTRACTION W/ INTRAOCULAR LENS  IMPLANT, BILATERAL  april and may 2014  . CYSTOSCOPY N/A  01/17/2017   Procedure: CYSTOSCOPY FLEXIBLE;  Surgeon: Ardis Hughs, MD;  [facility]: [facility];  Service: Urology;  Laterality: N/A;  . CYSTOSCOPY WITH RETROGRADE URETHROGRAM N/A 11/24/2014   Procedure: CYSTOSCOPY WITH RETROGRADE URETHROGRAM;  Surgeon: Ardis Hughs, MD;  Location: [facility];  Service: Urology;  Laterality: N/A;  . CYSTOSCOPY WITH URETHRAL DILATATION N/A 11/24/2014   Procedure: CYSTOSCOPY WITH URETHRAL DILATATION;  Surgeon: Ardis Hughs, MD;  Location[facility];  Service: Urology;  Laterality: N/A;  BALLOON DILATION        . ESOPHAGOGASTRODUODENOSCOPY N/A 05/15/2014   Procedure: ESOPHAGOGASTRODUODENOSCOPY (EGD);  Surgeon: Laurence Spates, MD;  Location: [facility];  Service: Endoscopy;  Laterality: N/A;  . ESOPHAGOGASTRODUODENOSCOPY Left 02/02/2015   Procedure: ESOPHAGOGASTRODUODENOSCOPY (EGD);  Surgeon: Teena Irani, MD;  [facility];  Service: Endoscopy;  Laterality: Left;  . ESOPHAGOGASTRODUODENOSCOPY Left 03/16/2015   Procedure: ESOPHAGOGASTRODUODENOSCOPY (EGD);  Surgeon: Teena Irani, MD;  [facility];  Service: Endoscopy;  Laterality: Left;  . TRANSTHORACIC ECHOCARDIOGRAM  01-07-2012   mild to moderate dilated LV,  ef 45-50%,  mild basal hypokinesis,  grade I diastolic  dysfunction/  trivial AR/  mild MR  . URETHROPLASTY N/A 01/17/2017   Procedure: URETHEROPLASTY BUCCAL GRAFT AND BUCCAL MUCOSA GRAFT HARVEST;  Surgeon: Ardis Hughs, MD;  [facility];  Service: Urology;  Laterality: N/A;    There were no vitals filed for this visit.  Subjective Assessment - 09/10/18 0901    Subjective  I feel good today just stiff, no pain         [facility] PT Assessment - 09/10/18 0001      Assessment   Medical Diagnosis  Chronic bilateral low back pain, DDD,,spinal stenosis with anterolisthesis L3-4,  generalized leg weakness.    Referring Provider (PT)  Kinnie Feil, MD resident, Lovenia Kim, MD       AROM   Lumbar Flexion  WNL    Lumbar Extension  few degrees past neutral    Lumbar - Right Side Bend  60%    Lumbar - Left Side Bend  60%    Lumbar - Right Rotation  60%    Lumbar - Left Rotation  60%      Strength   Overall Strength Comments  hip strength overall 4+/5, and knee strength overall 5/5 MMT bilat                   [facility] Adult PT Treatment/Exercise - 09/10/18 0001      Ambulation/Gait   Gait Comments  ambulating 75 ft from waiting room to gym and back, slow speed, some unsteadiness, reaches for objects when he can.       Lumbar Exercises: Stretches   Passive Hamstring Stretch  Right;Left;2 reps;30 seconds    Passive Hamstring Stretch Limitations  seated    Other Lumbar Stretch Exercise  seated knee flexion stretch using self OP, 5 sec X 10 each side      Lumbar Exercises: Aerobic   Nustep  L2 7 min UE/LE      Lumbar Exercises: Standing   Row  20 reps    Theraband Level (Row)  Level 3 (Green)    Shoulder Extension  20 reps    Theraband Level (Shoulder Extension)  Level 3 (Green)    Other Standing Lumbar Exercises  retro walking for balance and posterior chain activation, feet together balance       Lumbar Exercises: Seated   Long Arc Quad on Chair  Left;Right;15 reps    LAQ on Chair Weights (lbs)  3    Sit to Stand Limitations  3X5   30 sec 5TSTS test                 PT Long Term Goals - 09/10/18 0902      PT LONG TERM GOAL #1   Title  Pt will be I and compliant with HEP. (Target goal for all goals 6 weeks 09/19/18)    Baseline  reports compliance and has no quesitons    Status  Achieved      PT LONG TERM GOAL #2   Title  Pt will improve lumbar ROM to [facility] >50% to improve mobility    Baseline  met except extension can only get to neutral.    Status  Partially Met      PT LONG TERM GOAL #3   Title  Pt will improve leg strength to at least 4+/5 MMT to improve function    Baseline  now 4+/5    Status  Achieved      PT LONG  TERM GOAL #4   Title  Pt will improve FOTO to less than 57% limited to show improved function    Status  On-going  PT LONG TERM GOAL #5   Title  Pt will improve 5TSTS test to less than 30 seconds to show improved balance and leg endurance.    Baseline  now 30    Status  Achieved            Plan - 09/10/18 0910    Clinical Impression Statement  Progress note performed today and he has made some improvements in LE strength and overall endurace however he is still limited in endurance. He also stil has fwd flexed posture and can not achieve much more ROM other than a few degrees past neutral. He has met some of his PT goals. He will continue to benefit from a few more PT sessions with plan to transition to HEP.    Personal Factors and Comorbidities  Age;Comorbidity 1;Comorbidity 2;Comorbidity 3+;Time since onset of injury/illness/exacerbation;Past/Current Experience;Fitness    Comorbidities  PMH:AAA, Afib, CAAD, chronic LBP, GERD, CVA 09 and 2013, HTN, PVD    Examination-Activity Limitations  Bathing;Transfers;Bend;Sit;Carry;Squat;Stairs;Dressing;Stand;Lift    Examination-Participation Restrictions  Meal Prep;Cleaning;Driving;Laundry;Community Activity    Stability/Clinical Decision Making  Evolving/Moderate complexity    Rehab Potential  Good    PT Frequency  2x / week    PT Duration  6 weeks    PT Treatment/Interventions  ADLs/Self Care Home Management;Cryotherapy;Electrical Stimulation;Iontophoresis 66m/ml Dexamethasone;Moist Heat;Traction;Ultrasound;Gait training;Stair training;Therapeutic activities;Therapeutic exercise;Neuromuscular re-education;Balance training;Manual techniques;Passive range of motion;Energy conservation;Taping;Joint Manipulations    PT Next Visit Plan  needs lumbar flexion based stretching for stenosis, needs LE stretching and strengthening, standing and walking tolerance, balance, consider 3-6 min walk test    PT Home Exercise Plan  HSS, SKTC, supine marches,  sit to stand, seated flex stretch    Consulted and Agree with Plan of Care  Patient       Patient will benefit from skilled therapeutic intervention in order to improve the following deficits and impairments:  Abnormal gait, Decreased activity tolerance, Decreased balance, Decreased endurance, Decreased mobility, Decreased range of motion, Decreased strength, Difficulty walking, Hypomobility, Impaired flexibility, Postural dysfunction, Improper body mechanics, Pain  Visit Diagnosis: 1. Chronic bilateral low back pain without sciatica   2. Muscle weakness (generalized)   3. Other abnormalities of gait and mobility        Problem List Patient Active Problem List   Diagnosis Date Noted  . Muscle weakness (generalized) 04/08/2018  . Chronic pain of right knee 03/07/2017  . Chronic low back pain without sciatica 03/07/2017  . Urethral stricture due to infection 01/17/2017  . Nonischemic cardiomyopathy ([place] 09/25/2016  . Chest tightness 09/05/2016  . Shortness of breath 09/05/2016  . Bradycardia 08/27/2016  . Microalbuminuria 07/26/2015  . Physical deconditioning 04/26/2015  . Paroxysmal atrial fibrillation (HCC)   . History of CVA (cerebrovascular accident) without residual deficits   . Type 2 diabetes mellitus without complication, without long-term current use of insulin ([place]   . Acute blood loss anemia   . Coronary artery disease involving native coronary artery of native heart without angina pectoris   . Stricture, urethra   . Chronic kidney disease (CKD) 03/16/2015  . Depression 03/16/2015  . Achalasia   . Feeling weak   . Malnutrition of moderate degree 01/25/2015  . Weight loss, unintentional   . Dehydration   . Bladder outlet obstruction 01/20/2015  . Balance problem   . Bilateral wrist pain 09/09/2014  . Decreased visual acuity 04/08/2014  . Polyuria 03/18/2014  . GERD (gastroesophageal reflux disease) 10/21/2013  . AAA (abdominal aortic aneurysm) without rupture  ([place] 06/24/2013  .  PVD (peripheral vascular disease) ([place]) 06/24/2013  . Dizziness 03/18/2013  . Hearing loss of both ears 03/18/2013  . BPH (benign prostatic hyperplasia) 02/16/2013  . Urethral stricture 02/16/2013  . Preop cardiovascular exam 02/16/2013  . Other malaise and fatigue 07/17/2012  . Neuropathic pain of both legs 05/10/2012  . CVA (cerebral infarction) 01/10/2012  . TIA (transient ischemic attack) 01/08/2012  . Knee pain, bilateral 04/09/2011  . Urinary frequency 04/09/2011  . BACK PAIN, LUMBAR, CHRONIC 11/27/2007  . Hyperlipidemia 02/05/2007  . Overweight(278.02) 06/04/2006  . Essential hypertension 03/27/2006  . IMPOTENCE, ORGANIC 03/27/2006    Silvestre Mesi 09/10/2018, 9:22 AM  [facility] [address] [place], [place], [address] Phone: [phone]   Fax:  [phone]  Name: Brian Burns MRN: [id number] Date of Birth: 1945/04/02

## 2018-09-15 ENCOUNTER — Ambulatory Visit: Payer: Medicare Other | Admitting: Physical Therapy

## 2018-09-15 ENCOUNTER — Other Ambulatory Visit: Payer: Self-pay

## 2018-09-15 DIAGNOSIS — M545 Low back pain, unspecified: Secondary | ICD-10-CM

## 2018-09-15 DIAGNOSIS — G8929 Other chronic pain: Secondary | ICD-10-CM

## 2018-09-15 DIAGNOSIS — R2689 Other abnormalities of gait and mobility: Secondary | ICD-10-CM

## 2018-09-15 DIAGNOSIS — M6281 Muscle weakness (generalized): Secondary | ICD-10-CM

## 2018-09-15 NOTE — Therapy (Signed)
[place], [place], [address] Phone: [phone]   Fax:  [phone]  Physical Therapy Treatment  Patient Details  Name: Brian Burns MRN: [id number] Date of Birth: 05-01-1945 Referring Provider (PT): Kinnie Feil, MD resident, Lovenia Kim, MD   Encounter Date: 09/15/2018  PT End of Session - 09/15/18 0842    Visit Number  11    Number of Visits  12    Date for PT Re-Evaluation  09/19/18    Authorization Type  [facility] MCR/MCD    PT Start Time  [phone]    PT Stop Time  0915    PT Time Calculation (min)  42 min    Activity Tolerance  Patient limited by fatigue    Behavior During Therapy  [facility] for tasks assessed/performed       Past Medical History:  Diagnosis Date  . AAA (abdominal aortic aneurysm) without rupture ([place]) 06/2014   no change in last exam from 2015  . Arthritis    knees,lower back  . Atrial fibrillation ([place])   . Atrial flutter with rapid ventricular response ([place]) 01/16/2015  . CAD (coronary artery disease)    pt denies  . Carotid artery disease ([place])   . Chronic back pain   . Dyspnea   . Erectile dysfunction   . GERD (gastroesophageal reflux disease)   . Headache    hx of  . History of CVA (cerebrovascular accident)    2009  &  2013  . HLD (hyperlipidemia)   . Hypertension   . Idiopathic pancreatitis 12/2014   acute  . Pre-diabetes    per pt. no meds  . PVD (peripheral vascular disease) with claudication [facility])     Past Surgical History:  Procedure Laterality Date  . back injections     [employer]  . CARDIOVASCULAR STRESS TEST  08-09-2009   mild global hypokinesis,  no ischemia or scar/  ef 41%  . CATARACT EXTRACTION W/ INTRAOCULAR LENS  IMPLANT, BILATERAL  april and may 2014  . CYSTOSCOPY N/A 01/17/2017   Procedure: CYSTOSCOPY FLEXIBLE;  Surgeon: Ardis Hughs, MD;  [facility];  Service: Urology;  Laterality: N/A;  . CYSTOSCOPY WITH  RETROGRADE URETHROGRAM N/A 11/24/2014   Procedure: CYSTOSCOPY WITH RETROGRADE URETHROGRAM;  Surgeon: Ardis Hughs, MD;  Location: [facility];  Service: Urology;  Laterality: N/A;  . CYSTOSCOPY WITH URETHRAL DILATATION N/A 11/24/2014   Procedure: CYSTOSCOPY WITH URETHRAL DILATATION;  Surgeon: Ardis Hughs, MD;  Location: [facility];  Service: Urology;  Laterality: N/A;  BALLOON DILATION        . ESOPHAGOGASTRODUODENOSCOPY N/A 05/15/2014   Procedure: ESOPHAGOGASTRODUODENOSCOPY (EGD);  Surgeon: Laurence Spates, MD;  Location: [facility]COPY;  Service: Endoscopy;  Laterality: N/A;  . ESOPHAGOGASTRODUODENOSCOPY Left 02/02/2015   Procedure: ESOPHAGOGASTRODUODENOSCOPY (EGD);  Surgeon: Teena Irani, MD;  Location: [facility];  Service: Endoscopy;  Laterality: Left;  . ESOPHAGOGASTRODUODENOSCOPY Left 03/16/2015   Procedure: ESOPHAGOGASTRODUODENOSCOPY (EGD);  Surgeon: Teena Irani, MD;  [facility];  Service: Endoscopy;  Laterality: Left;  . TRANSTHORACIC ECHOCARDIOGRAM  01-07-2012   mild to moderate dilated LV,  ef 45-50%,  mild basal hypokinesis,  grade I diastolic  dysfunction/  trivial AR/  mild MR  . URETHROPLASTY N/A 01/17/2017   Procedure: URETHEROPLASTY BUCCAL GRAFT AND BUCCAL MUCOSA GRAFT HARVEST;  Surgeon: Ardis Hughs, MD;  [facility];  Service: Urology;  Laterality: N/A;    There were no vitals filed  for this visit.      [facility] PT Assessment - 09/15/18 0001      Assessment   Medical Diagnosis  Chronic bilateral low back pain, DDD,,spinal stenosis with anterolisthesis L3-4,  generalized leg weakness.    Referring Provider (PT)  Kinnie Feil, MD resident, Lovenia Kim, MD      Observation/Other Assessments   Focus on Therapeutic Outcomes (FOTO)   31% limited                   [facility] Adult PT Treatment/Exercise - 09/15/18 0001      Ambulation/Gait   Gait Comments  ambulating 75 ft from waiting room to gym  and back, slow speed, some unsteadiness, reaches for objects when he can.       Lumbar Exercises: Stretches   Passive Hamstring Stretch  Right;Left;2 reps;30 seconds    Passive Hamstring Stretch Limitations  seated    Other Lumbar Stretch Exercise  seated knee flexion stretch using self OP, 5 sec X 10 each side      Lumbar Exercises: Aerobic   Nustep  L2 7 min UE/LE      Lumbar Exercises: Standing   Row  20 reps    Theraband Level (Row)  Level 3 (Green)    Shoulder Extension  20 reps    Theraband Level (Shoulder Extension)  Level 3 (Green)    Other Standing Lumbar Exercises  retro walking for balance and posterior chain activation, feet together balance       Lumbar Exercises: Seated   Long Arc Quad on Chair  Left;Right;15 reps    LAQ on Chair Weights (lbs)  3    Sit to Stand Limitations  3X5   30 sec 5TSTS test                 PT Long Term Goals - 09/10/18 0902      PT LONG TERM GOAL #1   Title  Pt will be I and compliant with HEP. (Target goal for all goals 6 weeks 09/19/18)    Baseline  reports compliance and has no quesitons    Status  Achieved      PT LONG TERM GOAL #2   Title  Pt will improve lumbar ROM to [facility] >50% to improve mobility    Baseline  met except extension can only get to neutral.    Status  Partially Met      PT LONG TERM GOAL #3   Title  Pt will improve leg strength to at least 4+/5 MMT to improve function    Baseline  now 4+/5    Status  Achieved      PT LONG TERM GOAL #4   Title  Pt will improve FOTO to less than 57% limited to show improved function    Status  On-going      PT LONG TERM GOAL #5   Title  Pt will improve 5TSTS test to less than 30 seconds to show improved balance and leg endurance.    Baseline  now 30    Status  Achieved            Plan - 09/15/18 5701    Clinical Impression Statement  He relays he feels better and can move around better. He feels he understands his exercises and can continue to progress on his  own at home. He feels like next visit will be his last and then he will be ready for discharge. He did improve  his assessment of functional abilites on FOTO score    Personal Factors and Comorbidities  Age;Comorbidity 1;Comorbidity 2;Comorbidity 3+;Time since onset of injury/illness/exacerbation;Past/Current Experience;Fitness    Comorbidities  PMH:AAA, Afib, CAAD, chronic LBP, GERD, CVA 09 and 2013, HTN, PVD    Examination-Activity Limitations  Bathing;Transfers;Bend;Sit;Carry;Squat;Stairs;Dressing;Stand;Lift    Examination-Participation Restrictions  Meal Prep;Cleaning;Driving;Laundry;Community Activity    Stability/Clinical Decision Making  Evolving/Moderate complexity    Rehab Potential  Good    PT Frequency  2x / week    PT Duration  6 weeks    PT Treatment/Interventions  ADLs/Self Care Home Management;Cryotherapy;Electrical Stimulation;Iontophoresis 96m/ml Dexamethasone;Moist Heat;Traction;Ultrasound;Gait training;Stair training;Therapeutic activities;Therapeutic exercise;Neuromuscular re-education;Balance training;Manual techniques;Passive range of motion;Energy conservation;Taping;Joint Manipulations    PT Next Visit Plan  needs lumbar flexion based stretching for stenosis, needs LE stretching and strengthening, standing and walking tolerance, balance, consider 3-6 min walk test    PT Home Exercise Plan  HSS, SKTC, supine marches, sit to stand, seated flex stretch    Consulted and Agree with Plan of Care  Patient       Patient will benefit from skilled therapeutic intervention in order to improve the following deficits and impairments:  Abnormal gait, Decreased activity tolerance, Decreased balance, Decreased endurance, Decreased mobility, Decreased range of motion, Decreased strength, Difficulty walking, Hypomobility, Impaired flexibility, Postural dysfunction, Improper body mechanics, Pain  Visit Diagnosis: 1. Chronic bilateral low back pain without sciatica   2. Muscle weakness  (generalized)   3. Other abnormalities of gait and mobility        Problem List Patient Active Problem List   Diagnosis Date Noted  . Muscle weakness (generalized) 04/08/2018  . Chronic pain of right knee 03/07/2017  . Chronic low back pain without sciatica 03/07/2017  . Urethral stricture due to infection 01/17/2017  . Nonischemic cardiomyopathy ([place] 09/25/2016  . Chest tightness 09/05/2016  . Shortness of breath 09/05/2016  . Bradycardia 08/27/2016  . Microalbuminuria 07/26/2015  . Physical deconditioning 04/26/2015  . Paroxysmal atrial fibrillation (HCC)   . History of CVA (cerebrovascular accident) without residual deficits   . Type 2 diabetes mellitus without complication, without long-term current use of insulin ([place]   . Acute blood loss anemia   . Coronary artery disease involving native coronary artery of native heart without angina pectoris   . Stricture, urethra   . Chronic kidney disease (CKD) 03/16/2015  . Depression 03/16/2015  . Achalasia   . Feeling weak   . Malnutrition of moderate degree 01/25/2015  . Weight loss, unintentional   . Dehydration   . Bladder outlet obstruction 01/20/2015  . Balance problem   . Bilateral wrist pain 09/09/2014  . Decreased visual acuity 04/08/2014  . Polyuria 03/18/2014  . GERD (gastroesophageal reflux disease) 10/21/2013  . AAA (abdominal aortic aneurysm) without rupture ([place] 06/24/2013  . PVD (peripheral vascular disease) ([place] 06/24/2013  . Dizziness 03/18/2013  . Hearing loss of both ears 03/18/2013  . BPH (benign prostatic hyperplasia) 02/16/2013  . Urethral stricture 02/16/2013  . Preop cardiovascular exam 02/16/2013  . Other malaise and fatigue 07/17/2012  . Neuropathic pain of both legs 05/10/2012  . CVA (cerebral infarction) 01/10/2012  . TIA (transient ischemic attack) 01/08/2012  . Knee pain, bilateral 04/09/2011  . Urinary frequency 04/09/2011  . BACK PAIN, LUMBAR, CHRONIC 11/27/2007  . Hyperlipidemia  02/05/2007  . Overweight(278.02) 06/04/2006  . Essential hypertension 03/27/2006  . IMPOTENCE, ORGANIC 03/27/2006    BSilvestre Mesi8/18/2020, 9:07 AM  [facility]  [place], [place], [address] Phone: [phone]   Fax:  [phone]  Name: Brian Burns MRN: [id number] Date of Birth: 1945-02-06

## 2018-09-17 ENCOUNTER — Other Ambulatory Visit: Payer: Self-pay

## 2018-09-17 ENCOUNTER — Ambulatory Visit: Payer: Medicare Other | Admitting: Physical Therapy

## 2018-09-17 DIAGNOSIS — G8929 Other chronic pain: Secondary | ICD-10-CM | POA: Diagnosis not present

## 2018-09-17 DIAGNOSIS — M545 Low back pain: Secondary | ICD-10-CM | POA: Diagnosis not present

## 2018-09-17 DIAGNOSIS — R2689 Other abnormalities of gait and mobility: Secondary | ICD-10-CM | POA: Diagnosis not present

## 2018-09-17 DIAGNOSIS — M6281 Muscle weakness (generalized): Secondary | ICD-10-CM

## 2018-09-17 NOTE — Therapy (Signed)
[place], [place], [address] Phone: [phone]   Fax:  [phone]  Physical Therapy Treatment/Discharge PHYSICAL THERAPY DISCHARGE SUMMARY  Visits from Start of Care: 12  Current functional level related to goals / functional outcomes: 31% limited with subjective report   Remaining deficits: Objectively more like 50-60% limited due to poor endurance and gait instability   Education / Equipment: HEP Plan: Patient agrees to discharge.  Patient goals were partially met. Patient is being discharged due to the patient's request.  ?????     He would rather work on his HEP at home.   Patient Details  Name: Brian Burns MRN: [id number] Date of Birth: Dec 17, 1945 Referring Provider (PT): Kinnie Feil, MD resident, Lovenia Kim, MD   Encounter Date: 09/17/2018  PT End of Session - 09/17/18 0908    Visit Number  12    Number of Visits  12    Date for PT Re-Evaluation  09/19/18    Authorization Type  [employer] MCR/MCD    PT Start Time  0830    PT Stop Time  0915    PT Time Calculation (min)  45 min    Activity Tolerance  Patient tolerated treatment well    Behavior During Therapy  [facility] for tasks assessed/performed       Past Medical History:  Diagnosis Date  . AAA (abdominal aortic aneurysm) without rupture ([place]) 06/2014   no change in last exam from 2015  . Arthritis    knees,lower back  . Atrial fibrillation ([place])   . Atrial flutter with rapid ventricular response ([place]) 01/16/2015  . CAD (coronary artery disease)    pt denies  . Carotid artery disease ([place])   . Chronic back pain   . Dyspnea   . Erectile dysfunction   . GERD (gastroesophageal reflux disease)   . Headache    hx of  . History of CVA (cerebrovascular accident)    2009  &  2013  . HLD (hyperlipidemia)   . Hypertension   . Idiopathic pancreatitis 12/2014   acute  . Pre-diabetes    per pt. no meds  . PVD (peripheral  vascular disease) with claudication [facility])     Past Surgical History:  Procedure Laterality Date  . back injections     [employer]  . CARDIOVASCULAR STRESS TEST  08-09-2009   mild global hypokinesis,  no ischemia or scar/  ef 41%  . CATARACT EXTRACTION W/ INTRAOCULAR LENS  IMPLANT, BILATERAL  april and may 2014  . CYSTOSCOPY N/A 01/17/2017   Procedure: CYSTOSCOPY FLEXIBLE;  Surgeon: Ardis Hughs, MD;  Location: [facility];  Service: Urology;  Laterality: N/A;  . CYSTOSCOPY WITH RETROGRADE URETHROGRAM N/A 11/24/2014   Procedure: CYSTOSCOPY WITH RETROGRADE URETHROGRAM;  Surgeon: Ardis Hughs, MD;  Location: [facility];  Service: Urology;  Laterality: N/A;  . CYSTOSCOPY WITH URETHRAL DILATATION N/A 11/24/2014   Procedure: CYSTOSCOPY WITH URETHRAL DILATATION;  Surgeon: Ardis Hughs, MD;  Location: [facility];  Service: Urology;  Laterality: N/A;  BALLOON DILATION        . ESOPHAGOGASTRODUODENOSCOPY N/A 05/15/2014   Procedure: ESOPHAGOGASTRODUODENOSCOPY (EGD);  Surgeon: Laurence Spates, MD;  Location: [facility] ENDOSCOPY;  Service: Endoscopy;  Laterality: N/A;  . ESOPHAGOGASTRODUODENOSCOPY Left 02/02/2015   Procedure: ESOPHAGOGASTRODUODENOSCOPY (EGD);  Surgeon: Teena Irani, MD;  Location: [facility] ENDOSCOPY;  Service: Endoscopy;  Laterality: Left;  . ESOPHAGOGASTRODUODENOSCOPY Left 03/16/2015   Procedure: ESOPHAGOGASTRODUODENOSCOPY (EGD);  Surgeon: Jenny Reichmann  Amedeo Plenty, MD;  Location: [facility];  Service: Endoscopy;  Laterality: Left;  . TRANSTHORACIC ECHOCARDIOGRAM  01-07-2012   mild to moderate dilated LV,  ef 45-50%,  mild basal hypokinesis,  grade I diastolic  dysfunction/  trivial AR/  mild MR  . URETHROPLASTY N/A 01/17/2017   Procedure: URETHEROPLASTY BUCCAL GRAFT AND BUCCAL MUCOSA GRAFT HARVEST;  Surgeon: Ardis Hughs, MD;  Location: [facility];  Service: Urology;  Laterality: N/A;    There were no vitals filed for this visit.  Subjective  Assessment - 09/17/18 0858    Subjective  I dont feel as good today, everything is stiff and hurts, I still want to discharge and work on it at home.    Pertinent History  PMH:AAA, Afib, CAAD, chronic LBP, GERD, CA 09 and 2013, HTN, PVD    Limitations  Sitting;Lifting;Standing;Walking;House hold activities    How long can you sit comfortably?  10    How long can you stand comfortably?  5 min    How long can you walk comfortably?  50 ft    Diagnostic tests  MRI showed L3-L4 spinal stenosis with 2 mm of anterolisthesis due to OA.  L4-L5 had endplate osteophytes and shallow protrusion of the disc. Asymmetric foraminal stenosis on the left because of encroachment by osteophyte and bulging disc material could affect the left L4 nerve    Currently in Pain?  Yes    Pain Score  7     Pain Location  Back    Pain Onset  More than a month ago         [facility] PT Assessment - 09/17/18 0001      Assessment   Medical Diagnosis  Chronic bilateral low back pain, DDD,,spinal stenosis with anterolisthesis L3-4,  generalized leg weakness.    Referring Provider (PT)  Kinnie Feil, MD resident, Lovenia Kim, MD      Observation/Other Assessments   Focus on Therapeutic Outcomes (FOTO)   31% limited      AROM   Lumbar Flexion  WNL    Lumbar Extension  few degrees past neutral    Lumbar - Right Side Bend  60%    Lumbar - Left Side Bend  60%    Lumbar - Right Rotation  60%    Lumbar - Left Rotation  60%      Strength   Overall Strength Comments  hip strength overall 4+/5, and knee strength overall 5/5 MMT bilat                   [facility] Adult PT Treatment/Exercise - 09/17/18 0001      Ambulation/Gait   Gait Comments  100 ft with SPC, still slow speed and fwd posture, looks for something to grab if he can.       Lumbar Exercises: Stretches   Passive Hamstring Stretch  Right;Left;2 reps;30 seconds    Passive Hamstring Stretch Limitations  seated    Hip Flexor Stretch  Right;Left;2  reps;30 seconds    Hip Flexor Stretch Limitations  standing    Other Lumbar Stretch Exercise  seated lumbar fleixon stretch 5 sec X 15      Lumbar Exercises: Aerobic   Nustep  L3 7 min UE/LE with heat      Lumbar Exercises: Standing   Row  20 reps    Theraband Level (Row)  Level 3 (Green)    Shoulder Extension  20 reps    Theraband Level (Shoulder Extension)  Level 3 (Green)  Other Standing Lumbar Exercises  retro walking and sidestepping for balance and posterior chain activation, feet together balance       Lumbar Exercises: Seated   Long Arc Quad on Chair  Left;Right;15 reps    LAQ on Chair Weights (lbs)  3    Sit to Stand Limitations  5TSTS 26 sec    Other Seated Lumbar Exercises  H abd X 20 with green             PT Education - 09/17/18 0908    Education Details  importance of being consistent with HEP now that he is discharging from PT    Person(s) Educated  Patient    Methods  Explanation    Comprehension  Verbalized understanding          PT Long Term Goals - 09/17/18 0913      PT LONG TERM GOAL #1   Title  Pt will be I and compliant with HEP. (Target goal for all goals 6 weeks 09/19/18)    Baseline  reports compliance and has no quesitons    Status  Achieved      PT LONG TERM GOAL #2   Title  Pt will improve lumbar ROM to [facility] >50% to improve mobility    Baseline  met except extension can only get to neutral.    Status  Partially Met      PT LONG TERM GOAL #3   Title  Pt will improve leg strength to at least 4+/5 MMT to improve function    Baseline  now 4+/5    Status  Achieved      PT LONG TERM GOAL #4   Title  Pt will improve FOTO to less than 57% limited to show improved function    Baseline  now only 31% limited    Status  Achieved      PT LONG TERM GOAL #5   Title  Pt will improve 5TSTS test to less than 30 seconds to show improved balance and leg endurance.    Baseline  now 26    Status  Achieved            Plan - 09/17/18 0909     Clinical Impression Statement  He has made some improvments in PT and has now met 4/5 PT goals. He is still limited with posture, gait and standing tolerance. He has decent Leg strength now. He requests to discharge to HEP and it was reviewed with him and how important it is for him to stay active so he does not regress. He may even benefit from HHPT due to his limited ambulaiton abilites.    Personal Factors and Comorbidities  Age;Comorbidity 1;Comorbidity 2;Comorbidity 3+;Time since onset of injury/illness/exacerbation;Past/Current Experience;Fitness    Comorbidities  PMH:AAA, Afib, CAAD, chronic LBP, GERD, CVA 09 and 2013, HTN, PVD    Examination-Activity Limitations  Bathing;Transfers;Bend;Sit;Carry;Squat;Stairs;Dressing;Stand;Lift    Examination-Participation Restrictions  Meal Prep;Cleaning;Driving;Laundry;Community Activity    Stability/Clinical Decision Making  Evolving/Moderate complexity    Rehab Potential  Good    PT Frequency  2x / week    PT Duration  6 weeks    PT Treatment/Interventions  ADLs/Self Care Home Management;Cryotherapy;Electrical Stimulation;Iontophoresis 15m/ml Dexamethasone;Moist Heat;Traction;Ultrasound;Gait training;Stair training;Therapeutic activities;Therapeutic exercise;Neuromuscular re-education;Balance training;Manual techniques;Passive range of motion;Energy conservation;Taping;Joint Manipulations    PT Next Visit Plan  discharge    PT Home Exercise Plan  HSS, SKTC, supine marches, sit to stand, seated flex stretch    Consulted and Agree with Plan of Care  Patient       Patient will benefit from skilled therapeutic intervention in order to improve the following deficits and impairments:  Abnormal gait, Decreased activity tolerance, Decreased balance, Decreased endurance, Decreased mobility, Decreased range of motion, Decreased strength, Difficulty walking, Hypomobility, Impaired flexibility, Postural dysfunction, Improper body mechanics, Pain  Visit  Diagnosis: 1. Chronic bilateral low back pain without sciatica   2. Muscle weakness (generalized)   3. Other abnormalities of gait and mobility        Problem List Patient Active Problem List   Diagnosis Date Noted  . Muscle weakness (generalized) 04/08/2018  . Chronic pain of right knee 03/07/2017  . Chronic low back pain without sciatica 03/07/2017  . Urethral stricture due to infection 01/17/2017  . Nonischemic cardiomyopathy ([place]) 09/25/2016  . Chest tightness 09/05/2016  . Shortness of breath 09/05/2016  . Bradycardia 08/27/2016  . Microalbuminuria 07/26/2015  . Physical deconditioning 04/26/2015  . Paroxysmal atrial fibrillation (H[facility])   . History of CVA (cerebrovascular accident) without residual deficits   . Type 2 diabetes mellitus without complication, without long-term current use of insulin ([place])   . Acute blood loss anemia   . Coronary artery disease involving native coronary artery of native heart without angina pectoris   . Stricture, urethra   . Chronic kidney disease (CKD) 03/16/2015  . Depression 03/16/2015  . Achalasia   . Feeling weak   . Malnutrition of moderate degree 01/25/2015  . Weight loss, unintentional   . Dehydration   . Bladder outlet obstruction 01/20/2015  . Balance problem   . Bilateral wrist pain 09/09/2014  . Decreased visual acuity 04/08/2014  . Polyuria 03/18/2014  . GERD (gastroesophageal reflux disease) 10/21/2013  . AAA (abdominal aortic aneurysm) without rupture ([place]) 06/24/2013  . PVD (peripheral vascular disease) ([place]) 06/24/2013  . Dizziness 03/18/2013  . Hearing loss of both ears 03/18/2013  . BPH (benign prostatic hyperplasia) 02/16/2013  . Urethral stricture 02/16/2013  . Preop cardiovascular exam 02/16/2013  . Other malaise and fatigue 07/17/2012  . Neuropathic pain of both legs 05/10/2012  . CVA (cerebral infarction) 01/10/2012  . TIA (transient ischemic attack) 01/08/2012  . Knee pain, bilateral 04/09/2011  .  Urinary frequency 04/09/2011  . BACK PAIN, LUMBAR, CHRONIC 11/27/2007  . Hyperlipidemia 02/05/2007  . Overweight(278.02) 06/04/2006  . Essential hypertension 03/27/2006  . IMPOTENCE, ORGANIC 03/27/2006    Silvestre Mesi 09/17/2018, 9:29 AM  [facility] [address] [place], [place], [address] Phone: [phone]   Fax:  [phone]  Name: Brian Burns MRN: [id number] Date of Birth: 05/17/1945

## 2018-09-20 NOTE — Progress Notes (Signed)
Subjective: Brian Burns is a 73 y.o. y.o. male who presents today for preventative diabetic foot care with cc of painful, discolored, thick toenails and painful corns which interfere with daily activities. Pain is aggravated when wearing enclosed shoe gear and relieved with periodic professional debridement.  He voices no new pedal concerns on today's visit.   Current Outpatient Medications:  .  acetaminophen-codeine (TYLENOL #3) 300-30 MG tablet, Take 1 tablet by mouth every 6 (six) hours as needed for severe pain. (Patient not taking: Reported on 06/17/2018), Disp: 15 tablet, Rfl: 0 .  amLODipine (NORVASC) 10 MG tablet, Take 1 tablet (10 mg total) by mouth daily., Disp: 90 tablet, Rfl: 3 .  atorvastatin (LIPITOR) 40 MG tablet, Take 1 tablet (40 mg total) by mouth daily., Disp: 90 tablet, Rfl: 1 .  citalopram (CELEXA) 40 MG tablet, TAKE 1 TABLET(40 MG) BY MOUTH DAILY, Disp: 90 tablet, Rfl: 1 .  ferrous sulfate 325 (65 FE) MG tablet, take 1 tablet by mouth three times a day with meals, Disp: 90 tablet, Rfl: 3 .  finasteride (PROSCAR) 5 MG tablet, Take 1 tablet (5 mg total) by mouth daily., Disp: 90 tablet, Rfl: 2 .  gabapentin (NEURONTIN) 300 MG capsule, TAKE 2 CAPSULES(600 MG) BY MOUTH THREE TIMES DAILY, Disp: 90 capsule, Rfl: 2 .  lipase/protease/amylase (CREON) 12000 units CPEP capsule, take 1 capsule by mouth three times a day with meals, Disp: 270 capsule, Rfl: 3 .  pantoprazole (PROTONIX) 40 MG tablet, Take 1 tablet (40 mg total) by mouth daily., Disp: 30 tablet, Rfl: 0 .  rivaroxaban (XARELTO) 20 MG TABS tablet, Take 1 tablet (20 mg total) by mouth daily with supper., Disp: 90 tablet, Rfl: 3 .  tamsulosin (FLOMAX) 0.4 MG CAPS capsule, TAKE 1 CAPSULE(0.4 MG) BY MOUTH DAILY, Disp: 30 capsule, Rfl: 3  Allergies  Allergen Reactions  . Novocain [Procaine Hcl] Nausea And Vomiting  . Doxycycline Other (See Comments)    Reaction unknown    Objective:  Vascular Examination: Capillary  refill time immediate x 10 digits.  Dorsalis pedis pulses palpable b/l.  Posterior tibial pulses palpable b/l.  Digital hair absent x 10 digits.  Skin temperature gradient WNL b/l.  Dermatological Examination: Skin with normal turgor, texture and tone b/l.  Toenails 1-5 b/l discolored, thick, dystrophic with subungual debris and pain with palpation to nailbeds due to thickness of nails.  Hyperkeratotic lesion b/l hallux: medial left hallux, lateral right hallux. No erythema, no edema, no drainage, no flocculence noted.   Musculoskeletal: Muscle strength 5/5 to all LE muscle groups.  Neurological: Sensation intact 5/5 b/l with 10 gram monofilament.  Vibratory sensation intact b/l.  Assessment: 1. Painful onychomycosis toenails 1-5 b/ 2. Corn right hallux 3. Callus left hallux 4.  NIDDM  Plan: 1. Continue diabetic foot care principles. Literature dispensed on today. 2. Toenails 1-5 b/l were debrided in length and girth without iatrogenic bleeding. 3. Hyperkeratotic lesion(s) b/l halluxpared with sterile scalpel blade without incident.  4. Patient to continue soft, supportive shoe gear daily. 5. Patient to report any pedal injuries to medical professional immediately. 6. Follow up 3 months.  7. Patient/POA to call should there be a concern in the interim.

## 2018-10-13 ENCOUNTER — Other Ambulatory Visit: Payer: Self-pay | Admitting: *Deleted

## 2018-10-13 DIAGNOSIS — Z7901 Long term (current) use of anticoagulants: Secondary | ICD-10-CM

## 2018-10-15 MED ORDER — RIVAROXABAN 20 MG PO TABS: 20.0000 mg | ORAL_TABLET | Freq: Every day | ORAL | 3 refills | Status: DC

## 2018-10-29 ENCOUNTER — Ambulatory Visit (INDEPENDENT_AMBULATORY_CARE_PROVIDER_SITE_OTHER): Payer: Medicare Other | Admitting: Family Medicine

## 2018-10-29 ENCOUNTER — Encounter: Payer: Self-pay | Admitting: Family Medicine

## 2018-10-29 ENCOUNTER — Other Ambulatory Visit: Payer: Self-pay

## 2018-10-29 VITALS — BP 98/62 | HR 66

## 2018-10-29 DIAGNOSIS — R319 Hematuria, unspecified: Secondary | ICD-10-CM | POA: Diagnosis not present

## 2018-10-29 DIAGNOSIS — N401 Enlarged prostate with lower urinary tract symptoms: Secondary | ICD-10-CM

## 2018-10-29 DIAGNOSIS — E119 Type 2 diabetes mellitus without complications: Secondary | ICD-10-CM | POA: Diagnosis not present

## 2018-10-29 DIAGNOSIS — G8929 Other chronic pain: Secondary | ICD-10-CM

## 2018-10-29 DIAGNOSIS — N1831 Chronic kidney disease, stage 3a: Secondary | ICD-10-CM

## 2018-10-29 DIAGNOSIS — R3914 Feeling of incomplete bladder emptying: Secondary | ICD-10-CM

## 2018-10-29 DIAGNOSIS — R310 Gross hematuria: Secondary | ICD-10-CM | POA: Insufficient documentation

## 2018-10-29 DIAGNOSIS — R252 Cramp and spasm: Secondary | ICD-10-CM | POA: Diagnosis not present

## 2018-10-29 DIAGNOSIS — Z8673 Personal history of transient ischemic attack (TIA), and cerebral infarction without residual deficits: Secondary | ICD-10-CM | POA: Diagnosis not present

## 2018-10-29 DIAGNOSIS — M545 Low back pain, unspecified: Secondary | ICD-10-CM

## 2018-10-29 DIAGNOSIS — Z23 Encounter for immunization: Secondary | ICD-10-CM

## 2018-10-29 LAB — POCT GLYCOSYLATED HEMOGLOBIN (HGB A1C): HbA1c, POC (controlled diabetic range): 6.4 % (ref 0.0–7.0)

## 2018-10-29 LAB — POCT URINALYSIS DIP (MANUAL ENTRY)
Bilirubin, UA: NEGATIVE
Glucose, UA: NEGATIVE mg/dL
Nitrite, UA: NEGATIVE
Protein Ur, POC: 100 mg/dL — AB
Spec Grav, UA: 1.025 (ref 1.010–1.025)
Urobilinogen, UA: 0.2 E.U./dL
pH, UA: 6 (ref 5.0–8.0)

## 2018-10-29 LAB — POCT UA - MICROSCOPIC ONLY

## 2018-10-29 MED ORDER — DICLOFENAC SODIUM 1 % TD GEL: 4.0000 g | Freq: Four times a day (QID) | TRANSDERMAL | 1 refills | Status: DC

## 2018-10-29 NOTE — Assessment & Plan Note (Addendum)
Patient with continued back pain.  Instructed daughter to stop gabapentin as it is making patient drowsy and he does not notice any improvement.  They are encouraged to continue Tylenol.  We will also prescribe him with Voltaren gel.  Patient needs referral for home health PT and this was placed today as well.  He will need to follow-up with PCP regarding any changes to medications such as switching Celexa to an SNRI such as duloxetine that may also offer better pain control.  Patient may also benefit from injection in his back, he has previously had this.  Will need to follow-up with PCP regarding referral to proper location for this.

## 2018-10-29 NOTE — Patient Instructions (Signed)
Thank you for coming to see me today. It was a pleasure! Today we talked about:   I have sent a referral in for you to see your urologist.  Please call us if you do not hear from them within the next 1 to 2 weeks.  For your joint pain I have placed a referral for home health physical therapy.  I recommend that you also start using the Voltaren gel that I have provided.   We will call you with your lab results and results from your urine and has tested this to make sure there is not any current infection occurring.  Please follow-up with your primary care provider in 4 to 6 weeks or sooner as needed.  I also recommend scheduling appointment with our geriatric clinic for further evaluation of your chronic medical problems.  I will be in this clinic in the next 6 weeks, please try to schedule an appointment with me if possible.  If you have any questions or concerns, please do not hesitate to call the office at [phone].  Take Care,   [place] Branden Vine, DO

## 2018-10-29 NOTE — Assessment & Plan Note (Signed)
A1c 6.4 diet controlled.  No symptoms of low blood sugar

## 2018-10-29 NOTE — Progress Notes (Signed)
Subjective:  Patient ID: Brian Burns  DOB: 13-Sep-1945 MRN: [id number]  Brian Burns is a 73 y.o. male with a PMH of hypertension, AAA, PVD, paroxysmal A. fib, CAD,h/o nonischemic cardiomyopathy, GERD, T2DM, h/o CVA, BPH, here today for urinary frequency with gross hematuria, low back and right knee/leg pain.   HPI:  Diabetes Diet controlled On Xarelto, on statin Last eye exam: recently  Last foot exam:  Will need at next visit ROS: denies dizziness, diaphoresis, LOC, polyuria, polydipsia  Monitoring Labs and Parameters Last A1C:  Lab Results  Component Value Date   HGBA1C 6.4 10/29/2018   Gross hematuria, urinary frequency, inability to empty bladder: -Patient previously has history of ureteral stricture with urethroplasty in 12/2016, cystoscopy with retrograde urethrogram and urethral dilatation in 10/2014 -He is coming in today after having 2 months of where he is unable to empty his bladder.  He also has increased frequency.  His daughter states that he has been using depends for over a year and they have been going through more and more at home. -Patient reports that for the last month he has also noted that about 2 times a week when he goes to the bathroom he only sees blood in the toilet.  He denies any dysuria.  He states that he has not even stream and does not start and stop.  He does state that he has been going to the bathroom more however. -He denies any fevers, chills  Low back pain, right knee/leg pain -Patient has had chronic low back pain and was previously being seen by outpatient physical therapy who recommended home health.  His daughter did not realize that they were doing home health during the pandemic, he has not started this.  States that since PT has noticed that his back pain has worsened in his right knee has "given out". -States that his pain is worse when he goes to stand up.  It has been happening for greater than 5 years.  He states that he takes  Tylenol. -Patient was previously on gabapentin 600 mg 3 times a day however his daughter has decreased this amount to 300 mg given that she thought it was making him drowsy.  Also states that it was not helping him. -Daughter is concerned because in the last week he did fall at home.  His wife does most of the care for him at home and was able to help him off the ground.  Patient did not injure himself as it was a slow fall. -He does use a cane in order to help him walk. -He denies any bowel incontinence.  He denies any weakness.  He denies any numbness or tingling.  ROS: as mentioned in HPI   Social hx: Denies use of illicit drugs, alcohol use Smoking status reviewed  Patient Active Problem List   Diagnosis Date Noted  . Gross hematuria 10/29/2018  . Leg cramping 10/29/2018  . Chronic pain of right knee 03/07/2017  . Chronic low back pain without sciatica 03/07/2017  . Nonischemic cardiomyopathy ([facility]) 09/25/2016  . Bradycardia 08/27/2016  . Microalbuminuria 07/26/2015  . Paroxysmal atrial fibrillation ([facility]CC)   . History of CVA (cerebrovascular accident) without residual deficits   . Type 2 diabetes mellitus without complication, without long-term current use of insulin ([facility])   . Coronary artery disease involving native coronary artery of native heart without angina pectoris   . Stricture, urethra   . Chronic kidney disease (CKD) 03/16/2015  . Depression 03/16/2015  .  Achalasia   . Malnutrition of moderate degree 01/25/2015  . Dehydration   . Bilateral wrist pain 09/09/2014  . Decreased visual acuity 04/08/2014  . GERD (gastroesophageal reflux disease) 10/21/2013  . AAA (abdominal aortic aneurysm) without rupture ([place]) 06/24/2013  . PVD (peripheral vascular disease) ([place]) 06/24/2013  . Hearing loss of both ears 03/18/2013  . BPH (benign prostatic hyperplasia) 02/16/2013  . Neuropathic pain of both legs 05/10/2012  . Knee pain, bilateral 04/09/2011  . Hyperlipidemia 02/05/2007   . Overweight(278.02) 06/04/2006  . Essential hypertension 03/27/2006     Objective:  BP 98/62   Pulse 66   SpO2 97%   Vitals and nursing note reviewed  General: NAD, pleasant Cardiac: RRR, normal heart sounds, no m/r/g Pulm: normal effort, CTAB Extremities: no edema or cyanosis. WWP.  Strength 5/5 in bilateral lower extremities.  Gait with some shuffling noted and is quite slow even with assistance of cane. Back: No midline tenderness noted R Knee: No erythema, edema or crepitus noted on exam.  No tenderness on palpation.  F ROM. Skin: warm and dry, no rashes noted Neuro: alert and oriented, no focal deficits Psych: normal affect, normal thought content  Assessment & Plan:   Type 2 diabetes mellitus without complication, without long-term current use of insulin (HCC) A1c 6.4 diet controlled.  No symptoms of low blood sugar  Gross hematuria Patient with gross hematuria in urine, VSS.  History of BPH as well as ureteral stricture.  Will obtain urine culture to rule out infection.  Patient previously seen by urology but has not been seen since February 2019.  Will place another referral for patient to be seen by urology.  Bladder scan in office only showed 96 mL's although patient was unable to go to the bathroom initially, this was likely due to him not drinking prior to arrival.  Chronic low back pain without sciatica Patient with continued back pain.  Instructed daughter to stop gabapentin as it is making patient drowsy and he does not notice any improvement.  They are encouraged to continue Tylenol.  We will also prescribe him with Voltaren gel.  Patient needs referral for home health PT and this was placed today as well.  He will need to follow-up with PCP regarding any changes to medications such as switching Celexa to an SNRI such as duloxetine that may also offer better pain control.  Patient may also benefit from injection in his back, he has previously had this.  Will need to  follow-up with PCP regarding referral to proper location for this.  Leg cramping Patient reports that his right knee pain is more often located on his thigh.  He states that his thigh has recently been cramping with pain.  Will obtain BMP and magnesium in order to rule out electrolyte abnormality.  Patient is to start working with home health PT.  Also given Voltaren gel and will need to follow-up with PCP as stated in chronic low back pain problem.  Health maintenance Patient received flu vaccine today  [place] Tamie Minteer, [place] [facility] Resident PGY-3

## 2018-10-29 NOTE — Assessment & Plan Note (Signed)
Patient reports that his right knee pain is more often located on his thigh.  He states that his thigh has recently been cramping with pain.  Will obtain BMP and magnesium in order to rule out electrolyte abnormality.  Patient is to start working with home health PT.  Also given Voltaren gel and will need to follow-up with PCP as stated in chronic low back pain problem.

## 2018-10-29 NOTE — Assessment & Plan Note (Addendum)
Patient with gross hematuria in urine, VSS.  History of BPH as well as ureteral stricture.  Will obtain urine culture to rule out infection.  Patient previously seen by urology but has not been seen since February 2019.  Will place another referral for patient to be seen by urology.  Bladder scan in office only showed 96 mL's although patient was unable to go to the bathroom initially, this was likely due to him not drinking prior to arrival.

## 2018-10-30 LAB — CBC
Hematocrit: 33.8 % — ABNORMAL LOW (ref 37.5–51.0)
Hemoglobin: 11 g/dL — ABNORMAL LOW (ref 13.0–17.7)
MCH: 28.6 pg (ref 26.6–33.0)
MCHC: 32.5 g/dL (ref 31.5–35.7)
MCV: 88 fL (ref 79–97)
Platelets: 162 10*3/uL (ref 150–450)
RBC: 3.84 x10E6/uL — ABNORMAL LOW (ref 4.14–5.80)
RDW: 16.5 % — ABNORMAL HIGH (ref 11.6–15.4)
WBC: 6.4 10*3/uL (ref 3.4–10.8)

## 2018-10-30 LAB — BASIC METABOLIC PANEL
BUN/Creatinine Ratio: 12 (ref 10–24)
BUN: 21 mg/dL (ref 8–27)
CO2: 23 mmol/L (ref 20–29)
Calcium: 8.8 mg/dL (ref 8.6–10.2)
Chloride: 100 mmol/L (ref 96–106)
Creatinine, Ser: 1.82 mg/dL — ABNORMAL HIGH (ref 0.76–1.27)
GFR calc Af Amer: 42 mL/min/{1.73_m2} — ABNORMAL LOW (ref >59)
GFR calc non Af Amer: 37 mL/min/{1.73_m2} — ABNORMAL LOW (ref >59)
Glucose: 98 mg/dL (ref 65–99)
Potassium: 4.3 mmol/L (ref 3.5–5.2)
Sodium: 132 mmol/L — ABNORMAL LOW (ref 134–144)

## 2018-10-31 LAB — URINE CULTURE

## 2018-11-01 LAB — SPECIMEN STATUS REPORT

## 2018-11-02 LAB — MICROALBUMIN / CREATININE URINE RATIO
Creatinine, Urine: 202 mg/dL
Microalb/Creat Ratio: 192 mg/g creat — ABNORMAL HIGH (ref 0–29)
Microalbumin, Urine: 388.8 ug/mL

## 2018-11-04 NOTE — Addendum Note (Signed)
Addended by: Dutch Ing, [place] J on: 11/04/2018 11:38 AM   Modules accepted: Orders

## 2018-11-06 DIAGNOSIS — R310 Gross hematuria: Secondary | ICD-10-CM | POA: Diagnosis not present

## 2018-11-06 DIAGNOSIS — N35112 Postinfective bulbous urethral stricture, not elsewhere classified: Secondary | ICD-10-CM | POA: Diagnosis not present

## 2018-11-06 DIAGNOSIS — R350 Frequency of micturition: Secondary | ICD-10-CM | POA: Diagnosis not present

## 2018-11-10 ENCOUNTER — Other Ambulatory Visit: Payer: Self-pay

## 2018-11-10 ENCOUNTER — Telehealth: Payer: Self-pay | Admitting: *Deleted

## 2018-11-10 DIAGNOSIS — I714 Abdominal aortic aneurysm, without rupture: Secondary | ICD-10-CM | POA: Diagnosis not present

## 2018-11-10 DIAGNOSIS — I129 Hypertensive chronic kidney disease with stage 1 through stage 4 chronic kidney disease, or unspecified chronic kidney disease: Secondary | ICD-10-CM | POA: Diagnosis not present

## 2018-11-10 DIAGNOSIS — E1122 Type 2 diabetes mellitus with diabetic chronic kidney disease: Secondary | ICD-10-CM | POA: Diagnosis not present

## 2018-11-10 DIAGNOSIS — N189 Chronic kidney disease, unspecified: Secondary | ICD-10-CM | POA: Diagnosis not present

## 2018-11-10 DIAGNOSIS — M545 Low back pain: Secondary | ICD-10-CM | POA: Diagnosis not present

## 2018-11-10 DIAGNOSIS — E785 Hyperlipidemia, unspecified: Secondary | ICD-10-CM | POA: Diagnosis not present

## 2018-11-10 DIAGNOSIS — G8929 Other chronic pain: Secondary | ICD-10-CM | POA: Diagnosis not present

## 2018-11-10 DIAGNOSIS — I2510 Atherosclerotic heart disease of native coronary artery without angina pectoris: Secondary | ICD-10-CM | POA: Diagnosis not present

## 2018-11-10 DIAGNOSIS — H9193 Unspecified hearing loss, bilateral: Secondary | ICD-10-CM | POA: Diagnosis not present

## 2018-11-10 DIAGNOSIS — I429 Cardiomyopathy, unspecified: Secondary | ICD-10-CM | POA: Diagnosis not present

## 2018-11-10 DIAGNOSIS — Z8673 Personal history of transient ischemic attack (TIA), and cerebral infarction without residual deficits: Secondary | ICD-10-CM

## 2018-11-10 DIAGNOSIS — E1140 Type 2 diabetes mellitus with diabetic neuropathy, unspecified: Secondary | ICD-10-CM | POA: Diagnosis not present

## 2018-11-10 DIAGNOSIS — E1151 Type 2 diabetes mellitus with diabetic peripheral angiopathy without gangrene: Secondary | ICD-10-CM | POA: Diagnosis not present

## 2018-11-10 DIAGNOSIS — I480 Paroxysmal atrial fibrillation: Secondary | ICD-10-CM | POA: Diagnosis not present

## 2018-11-10 MED ORDER — TAMSULOSIN HCL 0.4 MG PO CAPS: ORAL_CAPSULE | ORAL | 3 refills | Status: DC

## 2018-11-10 NOTE — Telephone Encounter (Signed)
Franki from [facility] calling for PT verbal orders as follows:  1 time(s) weekly for 2 week(s), then 1 time(s) weekly for 3 week(s)  Is also requesting:  OT- eval and treat ST- Eval and treat SW - Eval and treat  You can leave verbal orders on confidential voicemail.  Christen Bame, CMA

## 2018-11-10 NOTE — Telephone Encounter (Signed)
Zannie Cove at [phone] and left verbal orders for PT as instructed in phone message: " PT orders: 1 time(s) weekly for 2 week(s), then 1 time(s) weekly for 3 week(s)".   Also requested OT, ST, and SW eval and treat as instructed.

## 2018-11-10 NOTE — Telephone Encounter (Signed)
Im sorry, I will need the contact number to place these orders.

## 2018-11-13 DIAGNOSIS — E1122 Type 2 diabetes mellitus with diabetic chronic kidney disease: Secondary | ICD-10-CM | POA: Diagnosis not present

## 2018-11-13 DIAGNOSIS — I480 Paroxysmal atrial fibrillation: Secondary | ICD-10-CM | POA: Diagnosis not present

## 2018-11-13 DIAGNOSIS — E1151 Type 2 diabetes mellitus with diabetic peripheral angiopathy without gangrene: Secondary | ICD-10-CM | POA: Diagnosis not present

## 2018-11-13 DIAGNOSIS — I714 Abdominal aortic aneurysm, without rupture: Secondary | ICD-10-CM | POA: Diagnosis not present

## 2018-11-13 DIAGNOSIS — I2510 Atherosclerotic heart disease of native coronary artery without angina pectoris: Secondary | ICD-10-CM | POA: Diagnosis not present

## 2018-11-13 DIAGNOSIS — I429 Cardiomyopathy, unspecified: Secondary | ICD-10-CM | POA: Diagnosis not present

## 2018-11-13 DIAGNOSIS — M545 Low back pain: Secondary | ICD-10-CM | POA: Diagnosis not present

## 2018-11-13 DIAGNOSIS — E785 Hyperlipidemia, unspecified: Secondary | ICD-10-CM | POA: Diagnosis not present

## 2018-11-13 DIAGNOSIS — H9193 Unspecified hearing loss, bilateral: Secondary | ICD-10-CM | POA: Diagnosis not present

## 2018-11-13 DIAGNOSIS — N189 Chronic kidney disease, unspecified: Secondary | ICD-10-CM | POA: Diagnosis not present

## 2018-11-13 DIAGNOSIS — I129 Hypertensive chronic kidney disease with stage 1 through stage 4 chronic kidney disease, or unspecified chronic kidney disease: Secondary | ICD-10-CM | POA: Diagnosis not present

## 2018-11-13 DIAGNOSIS — G8929 Other chronic pain: Secondary | ICD-10-CM | POA: Diagnosis not present

## 2018-11-13 DIAGNOSIS — E1140 Type 2 diabetes mellitus with diabetic neuropathy, unspecified: Secondary | ICD-10-CM | POA: Diagnosis not present

## 2018-11-16 DIAGNOSIS — H9193 Unspecified hearing loss, bilateral: Secondary | ICD-10-CM | POA: Diagnosis not present

## 2018-11-16 DIAGNOSIS — N189 Chronic kidney disease, unspecified: Secondary | ICD-10-CM | POA: Diagnosis not present

## 2018-11-16 DIAGNOSIS — I129 Hypertensive chronic kidney disease with stage 1 through stage 4 chronic kidney disease, or unspecified chronic kidney disease: Secondary | ICD-10-CM | POA: Diagnosis not present

## 2018-11-16 DIAGNOSIS — I480 Paroxysmal atrial fibrillation: Secondary | ICD-10-CM | POA: Diagnosis not present

## 2018-11-16 DIAGNOSIS — E1122 Type 2 diabetes mellitus with diabetic chronic kidney disease: Secondary | ICD-10-CM | POA: Diagnosis not present

## 2018-11-16 DIAGNOSIS — R350 Frequency of micturition: Secondary | ICD-10-CM | POA: Diagnosis not present

## 2018-11-16 DIAGNOSIS — E1151 Type 2 diabetes mellitus with diabetic peripheral angiopathy without gangrene: Secondary | ICD-10-CM | POA: Diagnosis not present

## 2018-11-16 DIAGNOSIS — I714 Abdominal aortic aneurysm, without rupture: Secondary | ICD-10-CM | POA: Diagnosis not present

## 2018-11-16 DIAGNOSIS — I429 Cardiomyopathy, unspecified: Secondary | ICD-10-CM | POA: Diagnosis not present

## 2018-11-16 DIAGNOSIS — M545 Low back pain: Secondary | ICD-10-CM | POA: Diagnosis not present

## 2018-11-16 DIAGNOSIS — G8929 Other chronic pain: Secondary | ICD-10-CM | POA: Diagnosis not present

## 2018-11-16 DIAGNOSIS — N35011 Post-traumatic bulbous urethral stricture: Secondary | ICD-10-CM | POA: Diagnosis not present

## 2018-11-16 DIAGNOSIS — I2510 Atherosclerotic heart disease of native coronary artery without angina pectoris: Secondary | ICD-10-CM | POA: Diagnosis not present

## 2018-11-16 DIAGNOSIS — E785 Hyperlipidemia, unspecified: Secondary | ICD-10-CM | POA: Diagnosis not present

## 2018-11-16 DIAGNOSIS — E1140 Type 2 diabetes mellitus with diabetic neuropathy, unspecified: Secondary | ICD-10-CM | POA: Diagnosis not present

## 2018-11-17 ENCOUNTER — Telehealth: Payer: Self-pay | Admitting: *Deleted

## 2018-11-17 DIAGNOSIS — I129 Hypertensive chronic kidney disease with stage 1 through stage 4 chronic kidney disease, or unspecified chronic kidney disease: Secondary | ICD-10-CM | POA: Diagnosis not present

## 2018-11-17 DIAGNOSIS — E1122 Type 2 diabetes mellitus with diabetic chronic kidney disease: Secondary | ICD-10-CM | POA: Diagnosis not present

## 2018-11-17 DIAGNOSIS — E785 Hyperlipidemia, unspecified: Secondary | ICD-10-CM | POA: Diagnosis not present

## 2018-11-17 DIAGNOSIS — M545 Low back pain: Secondary | ICD-10-CM | POA: Diagnosis not present

## 2018-11-17 DIAGNOSIS — N189 Chronic kidney disease, unspecified: Secondary | ICD-10-CM | POA: Diagnosis not present

## 2018-11-17 DIAGNOSIS — I2510 Atherosclerotic heart disease of native coronary artery without angina pectoris: Secondary | ICD-10-CM | POA: Diagnosis not present

## 2018-11-17 DIAGNOSIS — I714 Abdominal aortic aneurysm, without rupture: Secondary | ICD-10-CM | POA: Diagnosis not present

## 2018-11-17 DIAGNOSIS — G8929 Other chronic pain: Secondary | ICD-10-CM | POA: Diagnosis not present

## 2018-11-17 DIAGNOSIS — H9193 Unspecified hearing loss, bilateral: Secondary | ICD-10-CM | POA: Diagnosis not present

## 2018-11-17 DIAGNOSIS — I429 Cardiomyopathy, unspecified: Secondary | ICD-10-CM | POA: Diagnosis not present

## 2018-11-17 DIAGNOSIS — E1140 Type 2 diabetes mellitus with diabetic neuropathy, unspecified: Secondary | ICD-10-CM | POA: Diagnosis not present

## 2018-11-17 DIAGNOSIS — I480 Paroxysmal atrial fibrillation: Secondary | ICD-10-CM | POA: Diagnosis not present

## 2018-11-17 DIAGNOSIS — E1151 Type 2 diabetes mellitus with diabetic peripheral angiopathy without gangrene: Secondary | ICD-10-CM | POA: Diagnosis not present

## 2018-11-17 NOTE — Telephone Encounter (Signed)
Spoke to [place] and provided verbal orders as below.

## 2018-11-17 NOTE — Telephone Encounter (Signed)
Mallory from [facility] calling for ST verbal orders as follows:  1 time(s) weekly for 3 week(s)  You can leave verbal orders on confidential voicemail.  Christen Bame, CMA

## 2018-11-23 DIAGNOSIS — H9193 Unspecified hearing loss, bilateral: Secondary | ICD-10-CM | POA: Diagnosis not present

## 2018-11-23 DIAGNOSIS — G8929 Other chronic pain: Secondary | ICD-10-CM | POA: Diagnosis not present

## 2018-11-23 DIAGNOSIS — E1151 Type 2 diabetes mellitus with diabetic peripheral angiopathy without gangrene: Secondary | ICD-10-CM | POA: Diagnosis not present

## 2018-11-23 DIAGNOSIS — E1122 Type 2 diabetes mellitus with diabetic chronic kidney disease: Secondary | ICD-10-CM | POA: Diagnosis not present

## 2018-11-23 DIAGNOSIS — I129 Hypertensive chronic kidney disease with stage 1 through stage 4 chronic kidney disease, or unspecified chronic kidney disease: Secondary | ICD-10-CM | POA: Diagnosis not present

## 2018-11-23 DIAGNOSIS — M545 Low back pain: Secondary | ICD-10-CM | POA: Diagnosis not present

## 2018-11-23 DIAGNOSIS — E785 Hyperlipidemia, unspecified: Secondary | ICD-10-CM | POA: Diagnosis not present

## 2018-11-23 DIAGNOSIS — E1140 Type 2 diabetes mellitus with diabetic neuropathy, unspecified: Secondary | ICD-10-CM | POA: Diagnosis not present

## 2018-11-23 DIAGNOSIS — I2510 Atherosclerotic heart disease of native coronary artery without angina pectoris: Secondary | ICD-10-CM | POA: Diagnosis not present

## 2018-11-23 DIAGNOSIS — I480 Paroxysmal atrial fibrillation: Secondary | ICD-10-CM | POA: Diagnosis not present

## 2018-11-23 DIAGNOSIS — I429 Cardiomyopathy, unspecified: Secondary | ICD-10-CM | POA: Diagnosis not present

## 2018-11-23 DIAGNOSIS — N189 Chronic kidney disease, unspecified: Secondary | ICD-10-CM | POA: Diagnosis not present

## 2018-11-23 DIAGNOSIS — I714 Abdominal aortic aneurysm, without rupture: Secondary | ICD-10-CM | POA: Diagnosis not present

## 2018-11-25 DIAGNOSIS — G8929 Other chronic pain: Secondary | ICD-10-CM | POA: Diagnosis not present

## 2018-11-25 DIAGNOSIS — I480 Paroxysmal atrial fibrillation: Secondary | ICD-10-CM | POA: Diagnosis not present

## 2018-11-25 DIAGNOSIS — E785 Hyperlipidemia, unspecified: Secondary | ICD-10-CM | POA: Diagnosis not present

## 2018-11-25 DIAGNOSIS — I714 Abdominal aortic aneurysm, without rupture: Secondary | ICD-10-CM | POA: Diagnosis not present

## 2018-11-25 DIAGNOSIS — M545 Low back pain: Secondary | ICD-10-CM | POA: Diagnosis not present

## 2018-11-25 DIAGNOSIS — N189 Chronic kidney disease, unspecified: Secondary | ICD-10-CM | POA: Diagnosis not present

## 2018-11-25 DIAGNOSIS — E1140 Type 2 diabetes mellitus with diabetic neuropathy, unspecified: Secondary | ICD-10-CM | POA: Diagnosis not present

## 2018-11-25 DIAGNOSIS — I2510 Atherosclerotic heart disease of native coronary artery without angina pectoris: Secondary | ICD-10-CM | POA: Diagnosis not present

## 2018-11-25 DIAGNOSIS — E1122 Type 2 diabetes mellitus with diabetic chronic kidney disease: Secondary | ICD-10-CM | POA: Diagnosis not present

## 2018-11-25 DIAGNOSIS — E1151 Type 2 diabetes mellitus with diabetic peripheral angiopathy without gangrene: Secondary | ICD-10-CM | POA: Diagnosis not present

## 2018-11-25 DIAGNOSIS — H9193 Unspecified hearing loss, bilateral: Secondary | ICD-10-CM | POA: Diagnosis not present

## 2018-11-25 DIAGNOSIS — I429 Cardiomyopathy, unspecified: Secondary | ICD-10-CM | POA: Diagnosis not present

## 2018-11-25 DIAGNOSIS — I129 Hypertensive chronic kidney disease with stage 1 through stage 4 chronic kidney disease, or unspecified chronic kidney disease: Secondary | ICD-10-CM | POA: Diagnosis not present

## 2018-12-01 DIAGNOSIS — M545 Low back pain: Secondary | ICD-10-CM | POA: Diagnosis not present

## 2018-12-01 DIAGNOSIS — I129 Hypertensive chronic kidney disease with stage 1 through stage 4 chronic kidney disease, or unspecified chronic kidney disease: Secondary | ICD-10-CM | POA: Diagnosis not present

## 2018-12-01 DIAGNOSIS — I2510 Atherosclerotic heart disease of native coronary artery without angina pectoris: Secondary | ICD-10-CM | POA: Diagnosis not present

## 2018-12-01 DIAGNOSIS — N189 Chronic kidney disease, unspecified: Secondary | ICD-10-CM | POA: Diagnosis not present

## 2018-12-01 DIAGNOSIS — N35011 Post-traumatic bulbous urethral stricture: Secondary | ICD-10-CM | POA: Diagnosis not present

## 2018-12-01 DIAGNOSIS — H9193 Unspecified hearing loss, bilateral: Secondary | ICD-10-CM | POA: Diagnosis not present

## 2018-12-01 DIAGNOSIS — G8929 Other chronic pain: Secondary | ICD-10-CM | POA: Diagnosis not present

## 2018-12-01 DIAGNOSIS — E1151 Type 2 diabetes mellitus with diabetic peripheral angiopathy without gangrene: Secondary | ICD-10-CM | POA: Diagnosis not present

## 2018-12-01 DIAGNOSIS — E1140 Type 2 diabetes mellitus with diabetic neuropathy, unspecified: Secondary | ICD-10-CM | POA: Diagnosis not present

## 2018-12-01 DIAGNOSIS — I429 Cardiomyopathy, unspecified: Secondary | ICD-10-CM | POA: Diagnosis not present

## 2018-12-01 DIAGNOSIS — R350 Frequency of micturition: Secondary | ICD-10-CM | POA: Diagnosis not present

## 2018-12-01 DIAGNOSIS — E1122 Type 2 diabetes mellitus with diabetic chronic kidney disease: Secondary | ICD-10-CM | POA: Diagnosis not present

## 2018-12-01 DIAGNOSIS — I480 Paroxysmal atrial fibrillation: Secondary | ICD-10-CM | POA: Diagnosis not present

## 2018-12-01 DIAGNOSIS — I714 Abdominal aortic aneurysm, without rupture: Secondary | ICD-10-CM | POA: Diagnosis not present

## 2018-12-01 DIAGNOSIS — E785 Hyperlipidemia, unspecified: Secondary | ICD-10-CM | POA: Diagnosis not present

## 2018-12-02 DIAGNOSIS — H547 Unspecified visual loss: Secondary | ICD-10-CM

## 2018-12-02 HISTORY — DX: Unspecified visual loss: H54.7

## 2018-12-03 ENCOUNTER — Ambulatory Visit (INDEPENDENT_AMBULATORY_CARE_PROVIDER_SITE_OTHER): Payer: Medicare Other | Admitting: Family Medicine

## 2018-12-03 ENCOUNTER — Other Ambulatory Visit: Payer: Self-pay | Admitting: Urology

## 2018-12-03 ENCOUNTER — Encounter: Payer: Self-pay | Admitting: Family Medicine

## 2018-12-03 ENCOUNTER — Ambulatory Visit: Payer: Self-pay | Admitting: Licensed Clinical Social Worker

## 2018-12-03 ENCOUNTER — Encounter: Payer: Self-pay | Admitting: Licensed Clinical Social Worker

## 2018-12-03 ENCOUNTER — Other Ambulatory Visit: Payer: Self-pay

## 2018-12-03 ENCOUNTER — Ambulatory Visit: Payer: Medicare Other | Attending: Family Medicine | Admitting: Physical Therapy

## 2018-12-03 VITALS — BP 117/84 | HR 80 | Wt 225.0 lb

## 2018-12-03 DIAGNOSIS — Z139 Encounter for screening, unspecified: Secondary | ICD-10-CM

## 2018-12-03 DIAGNOSIS — H547 Unspecified visual loss: Secondary | ICD-10-CM

## 2018-12-03 DIAGNOSIS — M48062 Spinal stenosis, lumbar region with neurogenic claudication: Secondary | ICD-10-CM

## 2018-12-03 DIAGNOSIS — I152 Hypertension secondary to endocrine disorders: Secondary | ICD-10-CM

## 2018-12-03 DIAGNOSIS — M545 Low back pain, unspecified: Secondary | ICD-10-CM

## 2018-12-03 DIAGNOSIS — M6281 Muscle weakness (generalized): Secondary | ICD-10-CM | POA: Insufficient documentation

## 2018-12-03 DIAGNOSIS — F0150 Vascular dementia without behavioral disturbance: Secondary | ICD-10-CM | POA: Diagnosis not present

## 2018-12-03 DIAGNOSIS — I10 Essential (primary) hypertension: Secondary | ICD-10-CM

## 2018-12-03 DIAGNOSIS — R2689 Other abnormalities of gait and mobility: Secondary | ICD-10-CM

## 2018-12-03 DIAGNOSIS — E1122 Type 2 diabetes mellitus with diabetic chronic kidney disease: Secondary | ICD-10-CM

## 2018-12-03 DIAGNOSIS — Z7189 Other specified counseling: Secondary | ICD-10-CM

## 2018-12-03 DIAGNOSIS — E1159 Type 2 diabetes mellitus with other circulatory complications: Secondary | ICD-10-CM

## 2018-12-03 DIAGNOSIS — I480 Paroxysmal atrial fibrillation: Secondary | ICD-10-CM

## 2018-12-03 DIAGNOSIS — E663 Overweight: Secondary | ICD-10-CM | POA: Insufficient documentation

## 2018-12-03 DIAGNOSIS — N183 Chronic kidney disease, stage 3 unspecified: Secondary | ICD-10-CM

## 2018-12-03 DIAGNOSIS — I6381 Other cerebral infarction due to occlusion or stenosis of small artery: Secondary | ICD-10-CM

## 2018-12-03 DIAGNOSIS — Z789 Other specified health status: Secondary | ICD-10-CM

## 2018-12-03 DIAGNOSIS — R2681 Unsteadiness on feet: Secondary | ICD-10-CM | POA: Insufficient documentation

## 2018-12-03 DIAGNOSIS — N3941 Urge incontinence: Secondary | ICD-10-CM

## 2018-12-03 DIAGNOSIS — G8929 Other chronic pain: Secondary | ICD-10-CM

## 2018-12-03 DIAGNOSIS — M48061 Spinal stenosis, lumbar region without neurogenic claudication: Secondary | ICD-10-CM

## 2018-12-03 HISTORY — DX: Morbid (severe) obesity due to excess calories: E66.01

## 2018-12-03 HISTORY — DX: Other abnormalities of gait and mobility: R26.89

## 2018-12-03 MED ORDER — RIVAROXABAN 15 MG PO TABS: 15.0000 mg | ORAL_TABLET | Freq: Two times a day (BID) | ORAL | 3 refills | Status: DC

## 2018-12-03 NOTE — Patient Instructions (Addendum)
Licensed Clinical Social Worker Visit Information  Goals we discussed today:  Goals Addressed            This Visit's Progress   . Advance directives       Current Barriers:  . Literacy Barriers  Clinical Social Work Clinical Goal(s):  . the patient will have Advance Directive notarized today while in office and provide a copy to his provider   Interventions: . A voluntary discussion about advanced care planning including explanation and discussion of advanced directives was extentively discussed with the patient and his daughtet.  Explanation regarding healthcare proxy and living will was reviewed and packet with forms with expiration of how to fill them out was given.    Patient Self Care Activities:  . Can read and write at 6th grading reading level . Is able to readily make contact with social support system . Performs ADLs independently  Initial goal documentation      . food resources       Current Barriers:  . Financial constraints  Clinical Social Work Clinical Goal(s):  Marland Kitchen Over the next 30 days, patient will follow up with [employer] food program* as directed by SW Interventions: . Provided food boxes today and made referral for monthly pickup Patient Self Care Activities:  . Self administers medications as prescribed . Performs ADL's independently . Requires assistance and support from family  Initial goal documentation        Materials provided: Yes: Educational information provided to patient and daughter:  1. Living Will and Medical POA,  2. Resources for [employer], 3. Caring for a Person with Alxherimer's Disease 4. Alzheimer's Association information  Brian Burns and daughter Brian Burns were given information about Care Management services today including:  1. Care Management services include personalized support from designated clinical staff supervised by his physician, including individualized plan of care and coordination with other  care providers 2. 24/7 contact phone numbers for assistance for urgent and routine care needs. 3. The patient may stop Care Management services at any time by phone call to the office staff.  Patient agreed to services and verbal consent obtained.   Print copy of patient instructions provided.    Follow up plan: LCSW will F/U with patient and daughter over the next two weeks

## 2018-12-03 NOTE — Progress Notes (Signed)
[facility]:   Patient is accompanied by: daughter, [name] Primary caregiver: wife, Abelina Bachelor Patient's lives with their spouse. Patient information was obtained from patient and relative(s). History/Exam limitations: Patient with decreased hearing.  Biaural amplifier device used successfully to conduct interview Primary Care Provider: Joana Reamer, DO Referring provider: [place] Alfonza Toft, DO Reason for referral: Gait trouble, recent falls Chief Complaint  Patient presents with  . Gait Problem   Previous Report Reviewed: historical medical records, lab reports, office notes and radiology reports    Patient's Care Team Patient Care Team: Sherren Kerns, MD as Consulting Physician (Vascular Surgery) Helane Gunther, DPM as Consulting Physician (Podiatry) Mateo Flow, MD as Consulting Physician (Ophthalmology) Crist Fat, MD as Attending Physician (Urology) Rennis Golden Lisette Abu, MD as Consulting Physician (Cardiology)  ----------------------------------------------------------------------------------------------------------------------------------------------------------------------------------------------------------------------------------------------------------------   HPI by problems:  Chief Complaint  Patient presents with  . Gait Problem   FALL  Location:  1) Bathroom while attempting to rise from commode. "Legs gave out".  Fell in between commode and bathtub.  Required assistance from family member from outside home to get Mr Montefusco up.   2) Going down steps of home - "My legs just gave out." Activity prior to fall: rising from commode Change in position prior to fall: yes Dizziness prior to fall:  no Syncope prior to fall:  no Vertigo:  no Loss of balance: 1) No 2) perhaps Slip: 1) no; 2) yes Trip: 1) no; 2) no Slide: 1) yes 2) no What Body parts contacted object(s) or ground: 1) side of body; 2)  buttocks  What object or surface was struck: 1) bathtub, commode, and BR floor  ;  2) steps Injury: no in either case Associated acute Illness no Medications (new or dose changes):  no Prior Work-up of fall or fall complications: no, Mr Belter had OP pT recently at [facility] [place] PT on [address] for chronic LBP  Worry about falling:  yes   Vision Difficulty:  no Independent in ADLs: yes  Independent in I-ADLs: no  Hx of falls evaluation: no Hx of orthostatic dizziness: no Hx of problems with legs (strength, pain):  yes, bilateral leg weakness, knee pain, R>L Hx of problems with feet: no Hx of peripheral neuropathy: yes  Hx of arthritis:  DJD  Hx of urgency incontinence:  yes Hx of problems with balance: yes Hx of problems with Gait: yes   Hx of Parkinsons Disease: no  Hx of Strokes: yes Hx of Cardiac Disease:  no Hx of Cogntive Impairment: yes  Hx problems with transfers:  Yes, sit to stand Hx of WC dependence:  no  Hx of Vitamin D insufficiency: yes   Patient does fear falling or worries about falling She use a  (cane,walker,WC):  yes, a cane or Rollator Does  hold onto furtniture with walking in home.  Does to push up with her hands when standing up from chair Does not trouble stepping up on curbs Does rush to the toilet to urinate  Has lost  feeling in her feet  Does not take medications make her lightheaded or tired Does not take medications to fall asleep   Cognitive impairment concern  Are there problems with thinking?  none  When were the changes first noticed?  n/a ago  Did this change occur abruptly or gradually?  n/a   How have the changes progressed since then?  stable  Has there been any tremors or abnormal movements?  no  Have they  had in hallucinations or delusions:  no  Have they appeared more anxious or sad lately?  no  Do they still have interests or activities they enjor doing?  yes  How has their appetite been lately?  show no  change  How has their sleep been lately?  generally restful sleep  Problem behaviors:  none  Compared to 5 to 10 years ago, how is the patient at:  Problems with Judgment, e.g., problem making decisions, bad financial decisions, problems with thinking?   No  Less interested in hobbies or previously enjoyed activities?  No  Problem remembering things about family and friends e.g. names,  occupations, birthdays, addresses?  No  Problem remembering conversations or news events a few days later?  No  Problem remembering what day and month it is? no  Problem with losing things?  No  Problem with handling money for shopping?  No  Problem handling financial matters, e.g. their pension, checking, credit cards, dealing with the bank?  Dgt handles finances  Problem with getting lost in familiar places?  No  Problem with asking the same questions repeatedly or telling the same story repeatedly to the same person(s)?  No    Outpatient Encounter Medications as of 12/03/2018  Medication Sig  . amLODipine (NORVASC) 10 MG tablet Take 1 tablet (10 mg total) by mouth daily.  Marland Kitchen. atorvastatin (LIPITOR) 40 MG tablet Take 1 tablet (40 mg total) by mouth daily.  . citalopram (CELEXA) 40 MG tablet TAKE 1 TABLET(40 MG) BY MOUTH DAILY  . diclofenac sodium (VOLTAREN) 1 % GEL Apply 4 g topically 4 (four) times daily.  . ferrous sulfate 325 (65 FE) MG tablet take 1 tablet by mouth three times a day with meals  . finasteride (PROSCAR) 5 MG tablet Take 1 tablet (5 mg total) by mouth daily.  Marland Kitchen. gabapentin (NEURONTIN) 300 MG capsule TAKE 2 CAPSULES(600 MG) BY MOUTH THREE TIMES DAILY  . lipase/protease/amylase (CREON) 12000 units CPEP capsule take 1 capsule by mouth three times a day with meals  . pantoprazole (PROTONIX) 40 MG tablet Take 1 tablet (40 mg total) by mouth daily.  . rivaroxaban (XARELTO) 20 MG TABS tablet Take 1 tablet (20 mg total) by mouth daily with supper.  . tamsulosin (FLOMAX)  0.4 MG CAPS capsule TAKE 1 CAPSULE(0.4 MG) BY MOUTH DAILY  . [DISCONTINUED] acetaminophen-codeine (TYLENOL #3) 300-30 MG tablet Take 1 tablet by mouth every 6 (six) hours as needed for severe pain. (Patient not taking: Reported on 06/17/2018)   No facility-administered encounter medications on file as of 12/03/2018.     History Patient Active Problem List   Diagnosis Date Noted  . Morbid obesity (HCC) 12/03/2018  . Gross hematuria 10/29/2018  . Leg cramping 10/29/2018  . Other abnormalities of gait and mobility 09/02/2018  . Muscle weakness (generalized) 04/08/2018  . Chronic pain of right knee 03/07/2017  . Chronic low back pain without sciatica 03/07/2017  . Nonischemic cardiomyopathy (HCC) 09/25/2016  . Microalbuminuria 07/26/2015  . Paroxysmal atrial fibrillation (HCC)   . Coronary artery disease involving native coronary artery of native heart without angina pectoris   . Stricture, urethra   . Chronic kidney disease (CKD), stage III (moderate) 03/16/2015  . Depression 03/16/2015  . Normocytic anemia   . History of Multiple lacunar infarcts (HCC) 01/31/2015  . Decreased visual acuity 04/08/2014  . GERD (gastroesophageal reflux disease) 10/21/2013  . AAA (abdominal aortic aneurysm) without rupture (HCC) 06/24/2013  . PVD (peripheral vascular disease) (HCC) 06/24/2013  .  Hearing loss of both ears 03/18/2013  . BPH (benign prostatic hyperplasia) 02/16/2013  . Type 2 diabetes mellitus with diabetic neuropathy, unspecified (HCC) 05/10/2012  . Type 2 diabetes mellitus with hypercholesterolemia (HCC) 02/05/2007  . Hypertension associated with diabetes (HCC) 03/27/2006   Past Medical History:  Diagnosis Date  . AAA (abdominal aortic aneurysm) without rupture (HCC) 06/2014   no change in last exam from 2015  . Achalasia   . Arthritis    knees,lower back  . Atrial fibrillation (HCC)   . Atrial flutter with rapid ventricular response (HCC) 01/16/2015  . Bilateral wrist pain  09/09/2014  . Bladder outlet obstruction 01/20/2015  . Bradycardia 08/27/2016  . CAD (coronary artery disease)    pt denies  . Carotid artery disease (HCC)   . Chronic back pain   . Dyspnea   . Erectile dysfunction   . GERD (gastroesophageal reflux disease)   . Headache    hx of  . History of CVA (cerebrovascular accident)    2009  &  2013  . History of Multiple lacunar infarcts (HCC) 01/31/2015   MRI Brain 01/31/2015: Widespread old lacunar infarcts, particularly right Basal ganglia, pons, and right cerebellum  . HLD (hyperlipidemia)   . Hypertension   . Idiopathic pancreatitis 12/2014   acute  . Knee pain, bilateral 04/09/2011  . Malnutrition of moderate degree 01/25/2015  . Morbid obesity (HCC) 12/03/2018  . Other abnormalities of gait and mobility 12/03/2018  . Pre-diabetes    per pt. no meds  . PVD (peripheral vascular disease) with claudication (HCC)   . Urethral stricture 02/16/2013   Past Surgical History:  Procedure Laterality Date  . back injections     [facility]  . CARDIOVASCULAR STRESS TEST  08-09-2009   mild global hypokinesis,  no ischemia or scar/  ef 41%  . CATARACT EXTRACTION W/ INTRAOCULAR LENS  IMPLANT, BILATERAL  april and may 2014  . CYSTOSCOPY N/A 01/17/2017   Procedure: CYSTOSCOPY FLEXIBLE;  Surgeon: Crist FatHerrick, Benjamin W, MD;  Location: [facility];  Service: Urology;  Laterality: N/A;  . CYSTOSCOPY WITH RETROGRADE URETHROGRAM N/A 11/24/2014   Procedure: CYSTOSCOPY WITH RETROGRADE URETHROGRAM;  Surgeon: Crist FatBenjamin W Herrick, MD;  [facility] [place];  Service: Urology;  Laterality: N/A;  . CYSTOSCOPY WITH URETHRAL DILATATION N/A 11/24/2014   Procedure: CYSTOSCOPY WITH URETHRAL DILATATION;  Surgeon: Crist FatBenjamin W Herrick, MD;  [facility] [place];  Service: Urology;  Laterality: N/A;  BALLOON DILATION        . ESOPHAGOGASTRODUODENOSCOPY N/A 05/15/2014   Procedure: ESOPHAGOGASTRODUODENOSCOPY (EGD);  Surgeon: Carman ChingJames Edwards,  MD;  [facility];  Service: Endoscopy;  Laterality: N/A;  . ESOPHAGOGASTRODUODENOSCOPY Left 02/02/2015   Procedure: ESOPHAGOGASTRODUODENOSCOPY (EGD);  Surgeon: Dorena CookeyJohn Hayes, MD;  [facility];  Service: Endoscopy;  Laterality: Left;  . ESOPHAGOGASTRODUODENOSCOPY Left 03/16/2015   Procedure: ESOPHAGOGASTRODUODENOSCOPY (EGD);  Surgeon: Dorena CookeyJohn Hayes, MD;  [facility];  Service: Endoscopy;  Laterality: Left;  . TRANSTHORACIC ECHOCARDIOGRAM  01-07-2012   mild to moderate dilated LV,  ef 45-50%,  mild basal hypokinesis,  grade I diastolic  dysfunction/  trivial AR/  mild MR  . URETHROPLASTY N/A 01/17/2017   Procedure: URETHEROPLASTY BUCCAL GRAFT AND BUCCAL MUCOSA GRAFT HARVEST;  Surgeon: Crist FatHerrick, Benjamin W, MD;  Location: [facility];  Service: Urology;  Laterality: N/A;   Family History  Problem Relation Age of Onset  . Lung disease Brother   . Kidney disease Brother   . Heart disease Brother  died at 56  . Heart disease Mother        died at 71  . Kidney disease Mother   . Lung disease Mother    Social History   Socioeconomic History  . Marital status: Married    Spouse name: Damarko Stitely  . Number of children: 2   . Years of education: 7  . Highest education level: 7th grade  Occupational History  . 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 Occupation: B[employer]    Comment: on the weekends  Social Needs  . Financial resource strain: Not very hard  . Food insecurity    Worry: Never true    Inability: Never true  . Transportation needs    Medical: No    Non-medical: No  Tobacco Use  . Smoking status: Former Smoker    Packs/day: 0.50    Years: 40.00    Pack years: 20.00    Types: Cigarettes    Quit date: 01/29/1999    Years since quitting: 19.8  . Smokeless tobacco: Never Used  Substance and Sexual Activity  . Alcohol use: No    Alcohol/week: 0.0 standard drinks    Frequency: Never    Comment: hx of quit in 2006   . Drug use: Never  . Sexual activity: Not Currently  Lifestyle  . Physical activity    Days per week: Not on file    Minutes per session: Not on file  . Stress: Not on file  Relationships  . Social Herbalist on phone: Not on file    Gets together: Not on file    Attends religious service: Not on file    Active member of club or organization: Not on file    Attends meetings of clubs or organizations: Not on file    Relationship status: Not on file  Other Topics Concern  . Not on file  Social History Narrative  . Not on file   Cardiovascular Risk Factors: CVA, IDDM Type 2, Obesity, PVD and AAA  Educational History: 7 years formal education Personal History of Seizures: No -  Personal History of Stroke: Yes - multiple lacunar strokes (subcortical and cerebellar) Personal History of Head Trauma: No -  Personal History of Psychiatric Disorders: No -    Basic Activities of Daily Living  Dressing: Self-care Eating: Self-care Ambulation: Self-care Toileting: Self-care Bathing: Self-care  Instrumental Activities of Daily Living Shopping: Partial assistance House/Yard Work: Self-care Administration of medications: Self-care Finances: Total assistance Telephone: Self-care Transportation: Partial assistance   Caregivers in home: spouse    Formal Home Health Assistance  Physical Therapy: no, but had OP PT for LBP and Spinal stenosis that ended 09/17/18 at patient's request.    Occupational Therapy: no             Home Aid / Personal Care Service: no             Homemaker services: no  FALLS in last five office visits:  Fall Risk  12/03/2018 10/29/2018 06/17/2018 04/15/2018 04/08/2018  Falls in the past year? 1 1 1 1 1   Number falls in past yr: 1 1 1 1 1   Injury with Fall? 0 0 0 0 0  Risk for fall due to : Impaired balance/gait;History of fall(s);Impaired mobility - History of fall(s) - -  Follow up - - Education provided;Falls prevention discussed - -     Health Maintenance reviewed: Immunization History  Administered  Date(s) Administered  . Influenza,inj,Quad PF,6+ Mos 02/16/2013, 10/21/2013, 01/16/2015, 10/09/2015, 11/24/2017, 10/29/2018  . Pneumococcal Conjugate-13 07/20/2015  . Pneumococcal Polysaccharide-23 11/24/2017  . Td 05/29/2002  . Zoster Recombinat (Shingrix) 08/28/2016, 01/15/2018, 03/26/2018   Health Maintenance Topics with due status: Overdue     Topic Date Due   TETANUS/TDAP 05/28/2012   COLONOSCOPY 10/28/2012   OPHTHALMOLOGY EXAM 07/29/2014   LIPID PANEL 01/19/2016   FOOT EXAM 07/19/2016    Diet: Regular Nutritional supplements: no  Geriatric Syndromes: Incontinence yes  Dizziness no   Syncope no  Balance impairment:yes    Skin problems no   Visual Impairment no   Hearing impairment yes Impaired Memory or Cognition yes   Behavioral problems no   Sleep problems no   Weight loss no Drug Misadventure: no   Joint pain: yes Joint stiffness: yes Osteoporosis: no Pressure Ulcers: no Immobility: no Ankle edema: no History of UTIs: no   Vital Signs Weight: 225 lb (102.1 kg) Body mass index is 27.39 kg/m. CrCl cannot be calculated (Patient's most recent lab result is older than the maximum 21 days allowed.). Body surface area is 2.34 meters squared. Vitals:   12/03/18 1341  BP: 117/84  Pulse: 80  SpO2: 97%  Weight: 225 lb (102.1 kg)   Wt Readings from Last 3 Encounters:  12/03/18 225 lb (102.1 kg)  06/17/18 230 lb 6.4 oz (104.5 kg)  06/16/18 236 lb (107 kg)    Hearing Screening   Method: Audiometry             Right ear:   Fail 40 Fail  40    Left ear:   Fail Fail 40  40      Visual Acuity Screening   Right eye Left eye Both eyes  Without correction:  With correction:       Physical Exam VS: reviewed GEN: alert, cooperative and no distress HEENT: Head: Normocephalic, no lesions, without obvious  abnormality. Eye: negative findings: pupils equal, round, reactive to light and accomodation and visual fields full to confrontation Ears: Normal TM's bilaterally. Normal auditory canals and external ears. Non-tender. COR: Heart sounds are normal.  Regular rate and rhythm without murmur, gallop or rub. LUNG: clear to auscultation bilaterally ABD: normal appearance, normal bowel sounds, nontender to palpation,, no palpable masses, No HSM EXT: No C/C/E ; no deformities, gait abnormal - flexed at waist forward, slower gait.  Using cane.  No significant difference in stance/swing phases.  NEURO: alert, oriented, normal speech, no focal findings or movement disorder noted, cranial nerves II through XII intact, normal muscle tone, no tremors, strength 5/5 PSYCH:Poor insight; prosodic and clear speech, Language concrete and relevant, flat affect  Sit-to-Stand Test: unable to perform one sit-to-stand 4-Stage Stand Test:  Feet Side-by-side: Yes.       Feet Semi-tandem: No.      Mini-Mental State Examination or Montreal Cognitive Assessment:  Patient did  require additional cues or prompts to complete tasks. Patient was cooperative and attentive to testing tasks Patient did  appear motivated to perform well    6CIT Screen 06/17/2018  What Year? 0 points  What month? 0 points  What time? 0 points  Count back from 20 0 points  Months in reverse 2 points  Repeat phrase 2 points  Total Score 4    No flowsheet data found.      Montreal Cognitive Assessment  12/03/2018  Visuospatial/ Executive (0/5) 3  Naming (0/3) 2  Attention: Read list of  digits (0/2) 0  Attention: Read list of letters (0/1) 1  Attention: Serial 7 subtraction starting at 100 (0/3) 1  Language: Repeat phrase (0/2) 0  Language : Fluency (0/1) 0  Abstraction (0/2) 1  Delayed Recall (0/5) 1  Orientation (0/6) 6  Total 15  Adjusted Score (based on education) 16     Labs VITAMIND 22 2014  Vitamin B12 - none   RPR - NR  2017  No results found for: FOLATE  Lab Results  Component Value Date   TSH 1.148 03/16/2015    No results found for: RPR  Lab Results  Component Value Date   HIV Non Reactive 01/31/2015      Chemistry      Component Value Date/Time   NA 132 (L) 10/29/2018 1427   K 4.3 10/29/2018 1427   CL 100 10/29/2018 1427   CO2 23 10/29/2018 1427   BUN 21 10/29/2018 1427   CREATININE 1.82 (H) 10/29/2018 1427   CREATININE 1.33 (H) 04/26/2015 1538      Component Value Date/Time   CALCIUM 8.8 10/29/2018 1427   ALKPHOS 88 04/26/2015 1538   AST 17 04/26/2015 1538   ALT 16 04/26/2015 1538   BILITOT 0.4 04/26/2015 1538       Lab Results  Component Value Date   HGBA1C 6.4 10/29/2018     @10RELATIVEDAYS @  Hearing Screening   Method: Audiometry   125Hz  250Hz  500Hz  1000Hz  2000Hz  3000Hz  4000Hz  6000Hz  8000Hz   Right ear:   Fail 40 Fail  40    Left ear:   Fail Fail 40  40      Visual Acuity Screening   Right eye Left eye Both eyes  Without correction: 20/30 20/70 20/40   With correction:      Lab Results  Component Value Date   WBC 6.4 10/29/2018   HGB 11.0 (L) 10/29/2018   HCT 33.8 (L) 10/29/2018   MCV 88 10/29/2018   PLT 162 10/29/2018    No results found for this or any previous visit (from the past 24 hour(s)).  Imaging  Brain MRI: 01/31/2015:No acute infarct. Remote basal ganglia, pontine and right cerebellar infarcts.  Moderate small vessel disease type changes.  Global atrophy    Personal Strengths Supportive family/friends  Support System Strengths Supportive Relationships and Family   Advanced Directives Code Status: Full Advance Directives: Dgt, [name] is agent for Healthcare Power of  Attorney    ------------------------------------------------------------------------------------------------------------------------------------------------------------------------------------------------------------------------------------------------------------------------------------------------------------------------------------------------------------------------------------------------------------------------------------------------------------------------------------------------------------  Assessment and Plan: Please see individual consultation notes from physical therapy, pharmacy and social work for today.    Problem List Items Addressed This Visit    None     No problem-specific Assessment & Plan notes found for this encounter.      PHYSICAL THERAPY assessment and plan A physical therapy screen was completed in today's geriatric interdisciplinary assessment clinic visit.  The patient was not identified at a high fall risk.  No further PT services are recommended.      Patient to Follow up with  Dr.Mullis   > 60 minutes face to face were spent in total with interdisciplinary discussion, patient and caretaker counseling and coordination of care took more than 20 minutes. The Geriatric interdisciplinary team meet to discuss the patient's assessment, problem list, and recommendations.  The interdisciplinary team consisted of representatives from medicine, pharmacy, physical therapy and social work. The interdisciplinary team meet with the patient and caretakers to review the team's findings, assessments, and recommendations.

## 2018-12-03 NOTE — Chronic Care Management (AMB) (Signed)
Care Management    Clinical Social Work General Note  12/03/2018 Name: Brian Burns MRN: [id number] DOB: 1945/12/16  Brian Burns is a 73 y.o. year old male who is a primary care patient of Danna Hefty, DO. The Care Management team was consulted to assist the patient with Advanced Directive Education and assessing of other needs and barriers.  Patient was accompanied by his daughter Brian Burns during this joint visit with provider. Brian Burns was given information today about:  1. Care Management services include personalized support from designated clinical staff supervised by his physician, including individualized plan of care and coordination with other care providers 2. 24/7 contact phone numbers for assistance for urgent and routine care needs. 3. The patient may stop care management services at any time (effective at the end of the month) by phone call to the office staff.  Patient agreed to services and verbal consent obtained.   Review of patient status, including review of consultants reports, relevant laboratory and other test results, and collaboration with appropriate care team members and the patient's provider was performed as part of comprehensive patient evaluation and provision of chronic care management services.    Outpatient Encounter Medications as of 12/03/2018  Medication Sig Note  . acetaminophen (TYLENOL) 325 MG tablet Take 325 mg by mouth every 6 (six) hours as needed for moderate pain. 12/03/2018: Took 975 mg (three 325 mg tablets) last week  . amLODipine (NORVASC) 10 MG tablet Take 1 tablet (10 mg total) by mouth daily.   Marland Kitchen atorvastatin (LIPITOR) 40 MG tablet Take 1 tablet (40 mg total) by mouth daily.   . citalopram (CELEXA) 40 MG tablet TAKE 1 TABLET(40 MG) BY MOUTH DAILY   . diclofenac sodium (VOLTAREN) 1 % GEL Apply 4 g topically 4 (four) times daily. (Patient not taking: Reported on 12/03/2018) 12/03/2018: Used only once; doesn't find it effective  .  famotidine (PEPCID) 10 MG tablet Take 10 mg by mouth as needed for heartburn or indigestion. 12/03/2018: Only uses 1x per week. He purchases this medication over the counter and is not sure of the exact dose he takes.  . ferrous sulfate 325 (65 FE) MG tablet take 1 tablet by mouth three times a day with meals (Patient taking differently: Take 325 mg by mouth every other day. At night) 11/21/2017: Taking 1 tab every other day  . finasteride (PROSCAR) 5 MG tablet Take 1 tablet (5 mg total) by mouth daily.   Marland Kitchen gabapentin (NEURONTIN) 300 MG capsule TAKE 2 CAPSULES(600 MG) BY MOUTH THREE TIMES DAILY 12/03/2018: 300 mg once per day  . lipase/protease/amylase (CREON) 12000 units CPEP capsule take 1 capsule by mouth three times a day with meals 12/03/2018: Uses twice per day; Only eats breakfast and dinner.  . Rivaroxaban (XARELTO) 15 MG TABS tablet Take 1 tablet (15 mg total) by mouth 2 (two) times daily with a meal. With a meal   . tamsulosin (FLOMAX) 0.4 MG CAPS capsule TAKE 1 CAPSULE(0.4 MG) BY MOUTH DAILY   . [DISCONTINUED] rivaroxaban (XARELTO) 20 MG TABS tablet Take 1 tablet (20 mg total) by mouth daily with supper.    No facility-administered encounter medications on file as of 12/03/2018.    SDOH (Social Determinants of Health) screening performed today: Food Counselling psychologist. See Care Plan for related entries.  Advanced Directives Status: N See Care Plan and Vynca application for related entries.   Goals Addressed  This Visit's Progress   . Advance directives       Current Barriers:  . Literacy Barriers  Clinical Social Work Clinical Goal(s):  . the patient will have Advance Directive notarized today while in office and provide a copy to his provider   Interventions: . A voluntary discussion about advanced care planning including explanation and discussion of advanced directives was extentively discussed with the patient and his daughtet.  Explanation regarding  healthcare proxy and living will was reviewed and packet with forms with expiration of how to fill them out was given.    Patient Self Care Activities:  . Can read and write at 6th grading reading level . Is able to readily make contact with social support system . Performs ADLs independently  Initial goal documentation      . food resources       Current Barriers:  . Financial constraints  Clinical Social Work Clinical Goal(s):  Marland Kitchen Over the next 30 days, patient will follow up with [employer] food program* as directed by SW Interventions: . Provided food boxes today and made referral for monthly pickup Patient Self Care Activities:  . Self administers medications as prescribed . Performs ADL's independently . Requires assistance and support from family  Initial goal documentation        Educational information provided to patient and Daughter:  1. Living Will and Medical POA,  2. Resources for [employer], 3. Care giver Support with Association Alzheimer's  4. Caring for a Person withAlzheimer's Disease  Follow Up Plan: F/U phone appointment scheduled with patient's daugher on: 12/16/18 per patient's request.      Sammuel Hines, LCSW Clinical Social Worker [facility]   [phone] 4:16 PM

## 2018-12-03 NOTE — Therapy (Signed)
Brian Burns, [place], [address] Phone: [phone]   Fax:  [phone]  Physical Therapy Screen  Patient Details  Name: Brian Burns MRN: [id number] Date of Birth: 08-07-1945 Referring Provider (PT): Kinnie Feil, MD resident, Brian Kim, MD   Encounter Date: 12/03/2018    Past Medical History:  Diagnosis Date  . AAA (abdominal aortic aneurysm) without rupture ([place]) 06/2014   no change in last exam from 2015  . Achalasia   . Arthritis    knees,lower back  . Atrial fibrillation (Brian Burns)   . Atrial flutter with rapid ventricular response (Brian Burns) 01/16/2015  . Bilateral wrist pain 09/09/2014  . Bladder outlet obstruction 01/20/2015  . Bradycardia 08/27/2016  . CAD (coronary artery disease)    pt denies  . Carotid artery disease (Brian Burns)   . Chronic back pain   . Dyspnea   . Erectile dysfunction   . GERD (gastroesophageal reflux disease)   . Headache    hx of  . History of CVA (cerebrovascular accident)    2009  &  2013  . History of Multiple lacunar infarcts (Brian Burns) 01/31/2015   MRI Brain 01/31/2015: Widespread old lacunar infarcts, particularly right Basal ganglia, pons, and right cerebellum  . HLD (hyperlipidemia)   . Hypertension   . Idiopathic pancreatitis 12/2014   acute  . Knee pain, bilateral 04/09/2011  . Malnutrition of moderate degree 01/25/2015  . Morbid obesity (Brian Burns) 12/03/2018  . Other abnormalities of gait and mobility 12/03/2018  . Pre-diabetes    per pt. no meds  . PVD (peripheral vascular disease) with claudication ([place])   . Urethral stricture 02/16/2013    Past Surgical History:  Procedure Laterality Date  . back injections     [employer]  . CARDIOVASCULAR STRESS TEST  08-09-2009   mild global hypokinesis,  no ischemia or scar/  ef 41%  . CATARACT EXTRACTION W/ INTRAOCULAR LENS  IMPLANT, BILATERAL  april and may 2014  . CYSTOSCOPY N/A 01/17/2017   Procedure:  CYSTOSCOPY FLEXIBLE;  Surgeon: Ardis Hughs, MD;  Location: [facility];  Service: Urology;  Laterality: N/A;  . CYSTOSCOPY WITH RETROGRADE URETHROGRAM N/A 11/24/2014   Procedure: CYSTOSCOPY WITH RETROGRADE URETHROGRAM;  Surgeon: Ardis Hughs, MD;  Location: [facility];  Service: Urology;  Laterality: N/A;  . CYSTOSCOPY WITH URETHRAL DILATATION N/A 11/24/2014   Procedure: CYSTOSCOPY WITH URETHRAL DILATATION;  Surgeon: Ardis Hughs, MD;  Location: [facility];  Service: Urology;  Laterality: N/A;  BALLOON DILATION        . ESOPHAGOGASTRODUODENOSCOPY N/A 05/15/2014   Procedure: ESOPHAGOGASTRODUODENOSCOPY (EGD);  Surgeon: Laurence Spates, MD;  Location: [facility];  Service: Endoscopy;  Laterality: N/A;  . ESOPHAGOGASTRODUODENOSCOPY Left 02/02/2015   Procedure: ESOPHAGOGASTRODUODENOSCOPY (EGD);  Surgeon: Teena Irani, MD;  Location: [facility] END[facility];  Service: Endoscopy;  Laterality: Left;  . ESOPHAGOGASTRODUODENOSCOPY Left 03/16/2015   Procedure: ESOPHAGOGASTRODUODENOSCOPY (EGD);  Surgeon: Teena Irani, MD;  Location: [facility];  Service: Endoscopy;  Laterality: Left;  . TRANSTHORACIC ECHOCARDIOGRAM  01-07-2012   mild to moderate dilated LV,  ef 45-50%,  mild basal hypokinesis,  grade I diastolic  dysfunction/  trivial AR/  mild MR  . URETHROPLASTY N/A 01/17/2017   Procedure: URETHEROPLASTY BUCCAL GRAFT AND BUCCAL MUCOSA GRAFT HARVEST;  Surgeon: Ardis Hughs, MD;  Location: [facility];  Service: Urology;  Laterality: N/A;    There were no vitals filed for this visit.  Objective measurements completed on examination: See above findings.                 Goals completed from last outpatient RX at [name]/  PT Long Term Goals - 09/17/18 0913      PT LONG TERM GOAL #1   Title  Pt will be I and compliant with HEP. (Target goal for all goals 6 weeks 09/19/18)    Baseline  reports compliance  and has no quesitons    Status  Achieved      PT LONG TERM GOAL #2   Title  Pt will improve lumbar ROM to [facility] >50% to improve mobility    Baseline  met except extension can only get to neutral.    Status  Partially Met      PT LONG TERM GOAL #3   Title  Pt will improve leg strength to at least 4+/5 MMT to improve function    Baseline  now 4+/5    Status  Achieved      PT LONG TERM GOAL #4   Title  Pt will improve FOTO to less than 57% limited to show improved function    Baseline  now only 31% limited    Status  Achieved      PT LONG TERM GOAL #5   Title  Pt will improve 5TSTS test to less than 30 seconds to show improved balance and leg endurance.    Baseline  now 26    Status  Achieved        Clinical Impresssion Statement ;Pt is a 73 yo with chronic LBP, CKD, multiple CVA ( lacunar and pontine) with stenosis  presenting to the [facility] for Screen.  Pt has recently fallen once since completing OPPT at [name] and has had 4 falls in the past 6 months.  He utilizes a cane .    High fall risk with 4  stage balance test.  .0.97 ft/sec walking speed with cane.. Pt performed 5xSTS in 56 seconds which is indicative of high risk of fall.   Based on Short Physical Performance Battery, pt has a frailty rating of 3/12 with </= 5/12 considered frail and a score of </= 10/12 indicates one or more mobility limitations.  Pt would benefit from skilled PT to address impairments and decrease fall risk.    Based on above findings, pt is at a high fall risk. Pt was suggested to use Neuro rehabiliation at [name] and possible use of aquatics but pt does not know how to swim.  Pt/ dtr was told of use of [name] to walk in water to decrease wt on low back in more shallow water after he completes course of HHPT which will start on Monday, December 07, 2018.        Patient will benefit from skilled therapeutic intervention in order to improve the following deficits and  impairments:     Visit Diagnosis: Unsteadiness on feet  Muscle weakness (generalized)     Problem List Patient Active Problem List   Diagnosis Date Noted  . Morbid obesity ([place]) 12/03/2018  . Gross hematuria 10/29/2018  . Leg cramping 10/29/2018  . Other abnormalities of gait and mobility 09/02/2018  . Muscle weakness (generalized) 04/08/2018  . Chronic pain of right knee 03/07/2017  . Chronic low back pain without sciatica 03/07/2017  . Nonischemic cardiomyopathy ([place]) 09/25/2016  . Microalbuminuria 07/26/2015  . Paroxysmal atrial fibrillation ([facility])   . Coronary  artery disease involving native coronary artery of native heart without angina pectoris   . Stricture, urethra   . Chronic kidney disease (CKD), stage III (moderate) 03/16/2015  . Depression 03/16/2015  . Normocytic anemia   . History of Multiple lacunar infarcts ([place]) 01/31/2015  . Decreased visual acuity 04/08/2014  . GERD (gastroesophageal reflux disease) 10/21/2013  . AAA (abdominal aortic aneurysm) without rupture ([place]) 06/24/2013  . PVD (peripheral vascular disease) ([place]) 06/24/2013  . Hearing loss of both ears 03/18/2013  . BPH (benign prostatic hyperplasia) 02/16/2013  . Type 2 diabetes mellitus with diabetic neuropathy, unspecified ([place]) 05/10/2012  . Type 2 diabetes mellitus with hypercholesterolemia ([place]) 02/05/2007  . Hypertension associated with diabetes ([place]) 03/27/2006    Voncille Lo, PT Certified Exercise Expert for the Aging Adult  12/03/18 3:15 PM Phone: [phone] Fax: [facility] [address] [place], [place], [address] Phone: [phone]   Fax:  [phone]  Name: NORWOOD QUEZADA MRN: [id number] Date of Birth: 01-25-1946

## 2018-12-03 NOTE — Progress Notes (Signed)
Geriatric Pharmacy Note  Patient presents today with daughter, Ladona Horns, (permission given). Cathey inquires about dosing of daily medications. Also, Cathey inquires about a medication list handout.   1. Medication Reconciliation -DISCONTINUED 1. APAP-codeine: has not had a refill in multiple years 2. Diclofenac gel: patient did not find medication efficacious 3. Pantoprazole: Patient ran out of refills. Patient's daughter stated that he has been taking Pepcid instead. Patient reports Pepcid is efficacious in managing heartburn.   -INITIATED: 1. Pepcid: Patient reports taking this OTC rather than pantoprazole. 2. Tylenol: Patient will take tylenol to manage pain. In the last week, patient took Tylenol 975 mg once.  -CHANGED: 1. Ferrous sulfate: Patient's daughter clarified that patient takes 325 mg every other day rather than 975 mg daily.  2. Gabapentin: Patient's daughter has been weaning him off. She stated he currently takes 300 mg daily. There are only 3 tablets remaining in his prescription bottle.  3. Rivaroxaban: Patient's CrCl 26 mL/min. Adjusted dose from 20 mg daily to 15 mg daily. Sent in new prescription to [facility]. Patient's daughter confirmed she will pick up prescription from rivaroxaban 15 mg daily and make sure patient will start taking 15 mg dosing tonight. 4. Citalopram: Discussed with patient and patient's daughter consideration of dose decrease from 40 mg daily to 20 mg daily due to patient's age (> 78 years old). Patient's daughter stated patient's mood is worse during the holidays and would prefer to keep dose at 40 mg daily.   2. Medication monitoring 1. Lipid panel: Patient is currently taking atorvastatin 40 mg daily for secondary prophylaxis of an ASCVD event. Last lipid panel from 07/20/2015 showed TC 167 HDL 51 LDL 90 TG 129 VLDL 26. Consider obtaining updated lipid panel.   3. Patient-related questions -Educated patient's daughter that all medications  can be taken together except for ferrous sulfate (taken on an empty stomach) and tamsulosin (must be taken 30 minutes after a meal at the same time daily). -Provided Cathey with a medication related handout that provides information about medication names (brand/generic), indication, dosing, and clinical pearls. Cathey expresses appreciation for the handout.  Thank you for involving pharmacy to assist in providing Mr. Weiand care.   Drexel Iha, PharmD PGY2 Ambulatory Care Pharmacy Resident

## 2018-12-04 ENCOUNTER — Telehealth: Payer: Self-pay

## 2018-12-04 NOTE — Telephone Encounter (Signed)
Spoke to [facility] at [facility]. She notes patient is having balloon dilation for urethral stricture and retrograde urethrogram on 11/18. They would like to hold his Xarelto. Based on the my research given low risk procedure for bleeding and CrCl of 54, will recommend holding Xarelto for 48 hours prior to procedure. Pam understood and agreed to plan. She will contact patient to inform him.

## 2018-12-04 NOTE — Telephone Encounter (Signed)
Patient is scheduled for surgery at [facility] on 11/18. Pam from [facility] is calling to get orders to hold patients blood thinner 3 days prior. Please call Pam with this information.

## 2018-12-07 ENCOUNTER — Telehealth: Payer: Self-pay

## 2018-12-07 DIAGNOSIS — I714 Abdominal aortic aneurysm, without rupture: Secondary | ICD-10-CM | POA: Diagnosis not present

## 2018-12-07 DIAGNOSIS — E1140 Type 2 diabetes mellitus with diabetic neuropathy, unspecified: Secondary | ICD-10-CM | POA: Diagnosis not present

## 2018-12-07 DIAGNOSIS — M545 Low back pain: Secondary | ICD-10-CM | POA: Diagnosis not present

## 2018-12-07 DIAGNOSIS — I129 Hypertensive chronic kidney disease with stage 1 through stage 4 chronic kidney disease, or unspecified chronic kidney disease: Secondary | ICD-10-CM | POA: Diagnosis not present

## 2018-12-07 DIAGNOSIS — I429 Cardiomyopathy, unspecified: Secondary | ICD-10-CM | POA: Diagnosis not present

## 2018-12-07 DIAGNOSIS — H9193 Unspecified hearing loss, bilateral: Secondary | ICD-10-CM | POA: Diagnosis not present

## 2018-12-07 DIAGNOSIS — I2510 Atherosclerotic heart disease of native coronary artery without angina pectoris: Secondary | ICD-10-CM | POA: Diagnosis not present

## 2018-12-07 DIAGNOSIS — E785 Hyperlipidemia, unspecified: Secondary | ICD-10-CM | POA: Diagnosis not present

## 2018-12-07 DIAGNOSIS — G8929 Other chronic pain: Secondary | ICD-10-CM | POA: Diagnosis not present

## 2018-12-07 DIAGNOSIS — I480 Paroxysmal atrial fibrillation: Secondary | ICD-10-CM | POA: Diagnosis not present

## 2018-12-07 DIAGNOSIS — E1122 Type 2 diabetes mellitus with diabetic chronic kidney disease: Secondary | ICD-10-CM | POA: Diagnosis not present

## 2018-12-07 DIAGNOSIS — E1151 Type 2 diabetes mellitus with diabetic peripheral angiopathy without gangrene: Secondary | ICD-10-CM | POA: Diagnosis not present

## 2018-12-07 DIAGNOSIS — N189 Chronic kidney disease, unspecified: Secondary | ICD-10-CM | POA: Diagnosis not present

## 2018-12-07 NOTE — Telephone Encounter (Signed)
Tharon Aquas, home health PT, leaves VM on nurse line requesting verbal orders for PT.  2x a week for 1 week  1x a week for 3 weeks   VOs can be called to [phone], you can leave a VM.

## 2018-12-08 ENCOUNTER — Encounter: Payer: Self-pay | Admitting: Family Medicine

## 2018-12-08 DIAGNOSIS — F0150 Vascular dementia without behavioral disturbance: Secondary | ICD-10-CM | POA: Insufficient documentation

## 2018-12-08 DIAGNOSIS — M48061 Spinal stenosis, lumbar region without neurogenic claudication: Secondary | ICD-10-CM | POA: Insufficient documentation

## 2018-12-08 DIAGNOSIS — N3941 Urge incontinence: Secondary | ICD-10-CM

## 2018-12-08 HISTORY — DX: Urge incontinence: N39.41

## 2018-12-08 NOTE — Assessment & Plan Note (Signed)
Brian Burns has evidence of cognitive impairment (MoCA 16/30) and progressive  functional impairments  Mild stage of Dementia with resultant impaired abilities instrumental activities of daily living finances and transportation (dgt took away care keys after minor accident).  Basic ADLs are preserved.  There were no physical findings for movement disorderr  - Counseled patient and family regarding the diagnosis of dementia and progressive nature of the neurodegenerative disorder.  Provided packet of materials assembled for patients and families with more information about dementia and resources to assist with coping with impairments and behaviors.  Alzheimer Association referral was offered to the patient's daughter.  She accepted.   Recommended caregivers address advanced planning for patient's financial estate, future legal questions of competency and desired medical care the patient desires currently for resuscitation and for future care, including health care power of attorney agent, should patient become severely incapacitated.   Neurology consult was not recommended.   Patient and caregiver(s) questions were invited and answered as best as possible.

## 2018-12-08 NOTE — Assessment & Plan Note (Signed)
Established problem. Stable. Apixaban dosage adjusted to patient's GFR.

## 2018-12-08 NOTE — Assessment & Plan Note (Signed)
New problem Patient taking Flomax and Proscar for BPH. Initial management of Urge incontinence is nonpharmacologic Bladder Training.  A local resource for Bladder Training from the Physical Therapist, Earlie Counts at the [facility] Therapy department in [facility], [place].  I recommend you discussing this PT referral with the patient and his family at the next office visit, if this urge incontinence is significantly impairing the quality of Brian Burns's life.   Addition of an pharmacologic agent to impair detrusor muscle contraction in a patient with know bladder outlet obstruction can create retention.  If nonpharmacologic intervention is inadequate, I would recommend referral to Urology for consideration of urodynamic testing before starting anoth medication.

## 2018-12-08 NOTE — Telephone Encounter (Signed)
Verbal orders provided to [facility] as requested.

## 2018-12-08 NOTE — Assessment & Plan Note (Signed)
Established problem. Stable. Recommend BP goal less than 130/80 if can be achieved safely.

## 2018-12-08 NOTE — Assessment & Plan Note (Signed)
Established problem worsened.  Pain in low back worsening with impairing ability to walk.  Pain may also contribute to patient's recent falls.   Gabapentin did not help and caused excess sedation APAP provides only minimal relief.  Not an NSAID candidate bc CKD.   Patient would like a trial of  spinal injection(s) with goal of decreasing pain enough that he may walk more.   Referral to [facility] for injection therapy made.  Referral for Home Health PT made for evaluation and treatment of patient's back pain, bilateral proximal muscle weakness, and balance problem.    It is a taxing and considerable effort for Brian Burns to go to appointments outside his home bc of pain with ambulation, need for assistance by others and equipment, and cognitive impairment.

## 2018-12-08 NOTE — Assessment & Plan Note (Addendum)
Established problem worsened.  Progression in SCr since February 2020. Would check Renal panel with Urine Protein/Creatine measure and with iPTH for metabolic bone disease and CBC for progressive anemia of renal disease around January next month.

## 2018-12-08 NOTE — Assessment & Plan Note (Signed)
Recommend goal BP < 130/80 given progressive CKD, and lacunar infarcts.

## 2018-12-08 NOTE — Assessment & Plan Note (Signed)
Recommend repeat assessment visual acuity at next [facility] ov with consideration of referral to ophthalmology for evaluation.

## 2018-12-09 ENCOUNTER — Other Ambulatory Visit: Payer: Self-pay

## 2018-12-09 ENCOUNTER — Ambulatory Visit (INDEPENDENT_AMBULATORY_CARE_PROVIDER_SITE_OTHER): Payer: Medicare Other | Admitting: Podiatry

## 2018-12-09 ENCOUNTER — Encounter: Payer: Self-pay | Admitting: Family Medicine

## 2018-12-09 DIAGNOSIS — E119 Type 2 diabetes mellitus without complications: Secondary | ICD-10-CM | POA: Diagnosis not present

## 2018-12-09 DIAGNOSIS — B351 Tinea unguium: Secondary | ICD-10-CM | POA: Diagnosis not present

## 2018-12-09 DIAGNOSIS — Z9229 Personal history of other drug therapy: Secondary | ICD-10-CM

## 2018-12-09 DIAGNOSIS — M79674 Pain in right toe(s): Secondary | ICD-10-CM

## 2018-12-09 DIAGNOSIS — L84 Corns and callosities: Secondary | ICD-10-CM

## 2018-12-09 DIAGNOSIS — M79675 Pain in left toe(s): Secondary | ICD-10-CM | POA: Diagnosis not present

## 2018-12-09 DIAGNOSIS — Z7901 Long term (current) use of anticoagulants: Secondary | ICD-10-CM

## 2018-12-09 NOTE — Patient Instructions (Signed)
Diabetes Mellitus and Foot Care Foot care is an important part of your health, especially when you have diabetes. Diabetes may cause you to have problems because of poor blood flow (circulation) to your feet and legs, which can cause your skin to:  Become thinner and drier.  Break more easily.  Heal more slowly.  Peel and crack. You may also have nerve damage (neuropathy) in your legs and feet, causing decreased feeling in them. This means that you may not notice minor injuries to your feet that could lead to more serious problems. Noticing and addressing any potential problems early is the best way to prevent future foot problems. How to care for your feet Foot hygiene  Wash your feet daily with warm water and mild soap. Do not use hot water. Then, pat your feet and the areas between your toes until they are completely dry. Do not soak your feet as this can dry your skin.  Trim your toenails straight across. Do not dig under them or around the cuticle. File the edges of your nails with an emery board or nail file.  Apply a moisturizing lotion or petroleum jelly to the skin on your feet and to dry, brittle toenails. Use lotion that does not contain alcohol and is unscented. Do not apply lotion between your toes. Shoes and socks  Wear clean socks or stockings every day. Make sure they are not too tight. Do not wear knee-high stockings since they may decrease blood flow to your legs.  Wear shoes that fit properly and have enough cushioning. Always look in your shoes before you put them on to be sure there are no objects inside.  To break in new shoes, wear them for just a few hours a day. This prevents injuries on your feet. Wounds, scrapes, corns, and calluses  Check your feet daily for blisters, cuts, bruises, sores, and redness. If you cannot see the bottom of your feet, use a mirror or ask someone for help.  Do not cut corns or calluses or try to remove them with medicine.  If you  find a minor scrape, cut, or break in the skin on your feet, keep it and the skin around it clean and dry. You may clean these areas with mild soap and water. Do not clean the area with peroxide, alcohol, or iodine.  If you have a wound, scrape, corn, or callus on your foot, look at it several times a day to make sure it is healing and not infected. Check for: ? Redness, swelling, or pain. ? Fluid or blood. ? Warmth. ? Pus or a bad smell. General instructions  Do not cross your legs. This may decrease blood flow to your feet.  Do not use heating pads or hot water bottles on your feet. They may burn your skin. If you have lost feeling in your feet or legs, you may not know this is happening until it is too late.  Protect your feet from hot and cold by wearing shoes, such as at the beach or on hot pavement.  Schedule a complete foot exam at least once a year (annually) or more often if you have foot problems. If you have foot problems, report any cuts, sores, or bruises to your health care provider immediately. Contact a health care provider if:  You have a medical condition that increases your risk of infection and you have any cuts, sores, or bruises on your feet.  You have an injury that is not   healing.  You have redness on your legs or feet.  You feel burning or tingling in your legs or feet.  You have pain or cramps in your legs and feet.  Your legs or feet are numb.  Your feet always feel cold.  You have pain around a toenail. Get help right away if:  You have a wound, scrape, corn, or callus on your foot and: ? You have pain, swelling, or redness that gets worse. ? You have fluid or blood coming from the wound, scrape, corn, or callus. ? Your wound, scrape, corn, or callus feels warm to the touch. ? You have pus or a bad smell coming from the wound, scrape, corn, or callus. ? You have a fever. ? You have a red line going up your leg. Summary  Check your feet every day  for cuts, sores, red spots, swelling, and blisters.  Moisturize feet and legs daily.  Wear shoes that fit properly and have enough cushioning.  If you have foot problems, report any cuts, sores, or bruises to your health care provider immediately.  Schedule a complete foot exam at least once a year (annually) or more often if you have foot problems. This information is not intended to replace advice given to you by your health care provider. Make sure you discuss any questions you have with your health care provider. Document Released: 01/12/2000 Document Revised: 02/26/2017 Document Reviewed: 02/16/2016 Elsevier Patient Education  2020 [employer]  

## 2018-12-09 NOTE — Addendum Note (Signed)
Addended byWendy Poet, TODD D on: 12/09/2018 07:57 AM   Modules accepted: Level of Service

## 2018-12-10 NOTE — Patient Instructions (Addendum)
DUE TO COVID-19 ONLY ONE VISITOR IS ALLOWED TO COME WITH YOU AND STAY IN THE WAITING ROOM ONLY DURING PRE OP AND PROCEDURE DAY OF SURGERY. THE 1 VISITOR MAY VISIT WITH YOU AFTER SURGERY IN YOUR PRIVATE ROOM DURING VISITING HOURS ONLY!  YOU NEED TO HAVE A COVID 19 TEST ON____SATURDAY 11-14___ @____10 :00AM___, THIS TEST MUST BE DONE BEFORE SURGERY, COME  [address], [place] , [address].  ([place]) ONCE YOUR COVID TEST IS COMPLETED, PLEASE BEGIN THE QUARANTINE INSTRUCTIONS AS OUTLINED IN YOUR HANDOUT.                Brian Burns   Your procedure is scheduled on: 11-18   Report to [facility] Main  Entrance   Report to admitting at 11:30AM     Call this number if you have problems the morning of surgery [phone]    Remember: Do not eat food or drink liquids :After Midnight. BRUSH YOUR TEETH MORNING OF SURGERY AND RINSE YOUR MOUTH OUT, NO CHEWING GUM CANDY OR MINTS.     Take these medicines the morning of surgery with A SIP OF WATER: AMLODIPINE, ATORVASTATIN, TAMSULOSIN    How to Manage Your Diabetes Before and After Surgery  Why is it important to control my blood sugar before and after surgery? . Improving blood sugar levels before and after surgery helps healing and can limit problems. . A way of improving blood sugar control is eating a healthy diet by: o  Eating less sugar and carbohydrates o  Increasing activity/exercise o  Talking with your doctor about reaching your blood sugar goals . High blood sugars (greater than 180 mg/dL) can raise your risk of infections and slow your recovery, so you will need to focus on controlling your diabetes during the weeks before surgery. . Make sure that the doctor who takes care of your diabetes knows about your planned surgery including the date and location.  How do I manage my blood sugar before surgery? . Check your blood sugar at least 4 times a day, starting 2 days before surgery, to make sure  that the level is not too high or low. o Check your blood sugar the morning of your surgery when you wake up and every 2 hours until you get to the Short Stay unit. . If your blood sugar is less than 70 mg/dL, you will need to treat for low blood sugar: o Do not take insulin. o Treat a low blood sugar (less than 70 mg/dL) with  cup of clear juice (cranberry or apple), 4 glucose tablets, OR glucose gel. o Recheck blood sugar in 15 minutes after treatment (to make sure it is greater than 70 mg/dL). If your blood sugar is not greater than 70 mg/dL on recheck, call [phone] for further instructions. . Report your blood sugar to the short stay nurse when you get to Short Stay.  . If you are admitted to the hospital after surgery: o Your blood sugar will be checked by the staff and you will probably be given insulin after surgery (instead of oral diabetes medicines) to make sure you have good blood sugar levels. o The goal for blood sugar control after surgery is 80-180 mg/dL.    Reviewed and Endorsed by [facility] Patient Education Committee, August 2015  You may not have any metal on your body including hair pins and              piercings  Do not wear jewelry, make-up, lotions, powders or perfumes, deodorant                       Men may shave face and neck.   Do not bring valuables to the hospital. [facility] IS NOT             RESPONSIBLE   FOR VALUABLES.  Contacts, dentures or bridgework may not be worn into surgery.                 Please read over the following fact sheets you were given: _____________________________________________________________________             [facility] - Preparing for Surgery Before surgery, you can play an important role.  Because skin is not sterile, your skin needs to be as free of germs as possible.  You can reduce the number of germs on your skin by washing with CHG (chlorahexidine gluconate) soap before  surgery.  CHG is an antiseptic cleaner which kills germs and bonds with the skin to continue killing germs even after washing. Please DO NOT use if you have an allergy to CHG or antibacterial soaps.  If your skin becomes reddened/irritated stop using the CHG and inform your nurse when you arrive at Short Stay. Do not shave (including legs and underarms) for at least 48 hours prior to the first CHG shower.  You may shave your face/neck. Please follow these instructions carefully:  1.  Shower with CHG Soap the night before surgery and the  morning of Surgery.  2.  If you choose to wash your hair, wash your hair first as usual with your  normal  shampoo.  3.  After you shampoo, rinse your hair and body thoroughly to remove the  shampoo.                           4.  Use CHG as you would any other liquid soap.  You can apply chg directly  to the skin and wash                       Gently with a scrungie or clean washcloth.  5.  Apply the CHG Soap to your body ONLY FROM THE NECK DOWN.   Do not use on face/ open                           Wound or open sores. Avoid contact with eyes, ears mouth and genitals (private parts).                       Wash face,  Genitals (private parts) with your normal soap.             6.  Wash thoroughly, paying special attention to the area where your surgery  will be performed.  7.  Thoroughly rinse your body with warm water from the neck down.  8.  DO NOT shower/wash with your normal soap after using and rinsing off  the CHG Soap.                9.  Pat yourself dry with a clean towel.  10.  Wear clean pajamas.            11.  Place clean sheets on your bed the night of your first shower and do not  sleep with pets. Day of Surgery : Do not apply any lotions/deodorants the morning of surgery.  Please wear clean clothes to the hospital/surgery center.  FAILURE TO FOLLOW THESE INSTRUCTIONS MAY RESULT IN THE CANCELLATION OF YOUR SURGERY PATIENT  SIGNATURE_________________________________  NURSE SIGNATURE__________________________________  ________________________________________________________________________

## 2018-12-11 ENCOUNTER — Encounter (HOSPITAL_COMMUNITY): Payer: Self-pay

## 2018-12-11 ENCOUNTER — Encounter (HOSPITAL_COMMUNITY)
Admission: RE | Admit: 2018-12-11 | Discharge: 2018-12-11 | Disposition: A | Discharge status: Home / Self Care | Payer: Medicare Other | Source: Ambulatory Visit | Attending: Urology | Admitting: Urology

## 2018-12-11 ENCOUNTER — Other Ambulatory Visit: Payer: Self-pay

## 2018-12-11 DIAGNOSIS — Z01818 Encounter for other preprocedural examination: Secondary | ICD-10-CM | POA: Diagnosis not present

## 2018-12-11 DIAGNOSIS — Z79899 Other long term (current) drug therapy: Secondary | ICD-10-CM | POA: Diagnosis not present

## 2018-12-11 HISTORY — DX: Chronic kidney disease, stage 3 unspecified: N18.30

## 2018-12-11 HISTORY — DX: Other amnesia: R41.3

## 2018-12-11 LAB — CBC
HCT: 36.3 % — ABNORMAL LOW (ref 39.0–52.0)
Hemoglobin: 11.3 g/dL — ABNORMAL LOW (ref 13.0–17.0)
MCH: 28.3 pg (ref 26.0–34.0)
MCHC: 31.1 g/dL (ref 30.0–36.0)
MCV: 90.8 fL (ref 80.0–100.0)
Platelets: 131 10*3/uL — ABNORMAL LOW (ref 150–400)
RBC: 4 MIL/uL — ABNORMAL LOW (ref 4.22–5.81)
RDW: 16.8 % — ABNORMAL HIGH (ref 11.5–15.5)
WBC: 7 10*3/uL (ref 4.0–10.5)
nRBC: 0 % (ref 0.0–0.2)

## 2018-12-11 LAB — BASIC METABOLIC PANEL
Anion gap: 7 (ref 5–15)
BUN: 17 mg/dL (ref 8–23)
CO2: 25 mmol/L (ref 22–32)
Calcium: 8.9 mg/dL (ref 8.9–10.3)
Chloride: 104 mmol/L (ref 98–111)
Creatinine, Ser: 1.57 mg/dL — ABNORMAL HIGH (ref 0.61–1.24)
GFR calc Af Amer: 51 mL/min — ABNORMAL LOW (ref >60)
GFR calc non Af Amer: 44 mL/min — ABNORMAL LOW (ref >60)
Glucose, Bld: 110 mg/dL — ABNORMAL HIGH (ref 70–99)
Potassium: 4 mmol/L (ref 3.5–5.1)
Sodium: 136 mmol/L (ref 135–145)

## 2018-12-11 LAB — GLUCOSE, CAPILLARY: Glucose-Capillary: 94 mg/dL (ref 70–99)

## 2018-12-11 NOTE — Progress Notes (Signed)
PCP - Danna Hefty, DO Cardiologist - [place], [place]  telemedecine 06-16-2018  [facility] vascular surgery 06-2017 epic   Chest x-ray -  EKG - done today 11-13 epic Stress Test - 2018 epic  ECHO - 2018 epic  Cardiac Cath -   Sleep Study -  CPAP -   Fasting Blood Sugar - 94 Checks Blood Sugar ___0__ times a day  Blood Thinner Instructions:Xarelto ---has been instructed by PCP and Dr Louis Meckel to hold X 48 hours  Aspirin Instructions: Last Dose:  Anesthesia review:   Patient with hx sig for HTN, CAD, AAA, A FLUTTER RVR, CVA, AFIB.  His daughter [name] is present for appt today . patient has mild dementia but today is alert and oriented on assessment. He and his daughter both participate in pre-op appt. Patient was last seen via telemed visit by cardio Dr Debara Pickett on 5-19 at which time patient had no cardiac complaints. Today patient denies any acute cardiac symtpoms. He does endorse SOB but states " that is normal for me , but no my chest don't hurt with it, just when I really get to moving , I get winded and need to sit down that's all". He is able to ambulate independently with cane but does not exercise.    Patient denies shortness of breath, fever, cough and chest pain at PAT appointment   Patient verbalized understanding of instructions that were given to them at the PAT appointment. Patient was also instructed that they will need to review over the PAT instructions again at home before surgery.

## 2018-12-12 ENCOUNTER — Other Ambulatory Visit (HOSPITAL_COMMUNITY)
Admission: RE | Admit: 2018-12-12 | Discharge: 2018-12-12 | Disposition: A | Discharge status: Home / Self Care | Payer: Medicare Other | Source: Ambulatory Visit | Attending: Urology | Admitting: Urology

## 2018-12-12 DIAGNOSIS — Z20828 Contact with and (suspected) exposure to other viral communicable diseases: Secondary | ICD-10-CM | POA: Insufficient documentation

## 2018-12-12 DIAGNOSIS — Z01812 Encounter for preprocedural laboratory examination: Secondary | ICD-10-CM | POA: Insufficient documentation

## 2018-12-13 LAB — NOVEL CORONAVIRUS, NAA (HOSP ORDER, SEND-OUT TO REF LAB; TAT 18-24 HRS): SARS-CoV-2, NAA: NOT DETECTED

## 2018-12-14 NOTE — Progress Notes (Signed)
Anesthesia Chart Review   Case: [id number] Date/Time: 12/16/18 1315   Procedures:      CYSTOSCOPY WITH RETROGRADE URETHROGRAM (N/A )     URETHRAL BALLOON DILATION (N/A )   Anesthesia type: General   Pre-op diagnosis: URETHRAL STRICTURE   Location: [facility] PROC[facility]   Surgeon: Crist Fat, MD      DISCUSSION:73 y.o. former smoker (20 pack years, quit 01/29/99) with h/o HTN, GERD, HLD, carotid artery disease, PVD, AAA, A-fib (on Xarelto), CAD, CVA, pre-diabetes, CKD Stage III, urethral stricture scheduled for above procedure 12/16/2018 with Dr. Berniece Salines.    Per Orpah Cobb, DO with family medicine, "Based on the my research given low risk procedure for bleeding and CrCl of 54, will recommend holding Xarelto for 48 hours prior to procedure."  Last seen by cardiologist, Dr. Zoila Shutter, 06/16/2018.  Per OV note, "Low preoperative risk - low risk myoview, with EF 46%, 50-55% by echo. Brian Burns had no significant concerns today.  Denies any chest pain or worsening shortness of breath.  His cholesterol has been fairly well treated.  Weight is up several pounds.  His AAA is followed by vascular surgery.  He denies any concerns with his A. fib.  Denies any bleeding problems.  His INR is checked regularly."  1 year follow up recommended.  VS: BP (!) 119/99 (BP Location: Right Arm)   Pulse 64   Temp 36.7 C (Oral)   Resp 18   Ht 6\' 4"  (1.93 m)   Wt 103.6 kg   SpO2 97%   BMI 27.81 kg/m   PROVIDERS: Brian Reamer, DO is PCP   Zoila Shutter, MD is Cardiologist  LABS: Labs reviewed: Acceptable for surgery. (all labs ordered are listed, but only abnormal results are displayed)  Labs Reviewed  BASIC METABOLIC PANEL - Abnormal; Notable for the following components:      Result Value   Glucose, Bld 110 (*)    Creatinine, Ser 1.57 (*)    GFR calc non Af Amer 44 (*)    GFR calc Af Amer 51 (*)    All other components within normal limits  CBC - Abnormal; Notable  for the following components:   RBC 4.00 (*)    Hemoglobin 11.3 (*)    HCT 36.3 (*)    RDW 16.8 (*)    Platelets 131 (*)    All other components within normal limits  GLUCOSE, CAPILLARY     IMAGES:   EKG: 12/11/2018 Rate 70 bpm Normal sinus rhythm  Left axis deviation  Left ventricular hypertrophy Nonspecific T wave abnormality   CV: Echo 10/01/2016 Study Conclusions  - Left ventricle: The cavity size was mildly dilated. Wall   thickness was increased in a pattern of mild LVH. Systolic   function was normal. The estimated ejection fraction was in the   range of 50% to 55%. There is hypokinesis of the inferolateral   myocardium. Doppler parameters are consistent with abnormal left   ventricular relaxation (grade 1 diastolic dysfunction). Doppler   parameters are consistent with high ventricular filling pressure. - Aortic valve: There was trivial regurgitation. - Aortic root: The aortic root was mildly dilated. - Ascending aorta: The ascending aorta was mildly dilated. - Mitral valve: Calcified annulus. - Right ventricle: The cavity size was mildly dilated.  Impressions:  - Hypokinesis of the inferolateral wall with overall low normal LV   systolic function; mild diastolic dysfunction; sclerotic aortic   valve with trace AI;  mildly dilated aortic root; mild RVE; mild   TR.  Myocardial Perfusion 09/12/2016   The left ventricular ejection fraction is mildly decreased (45-54%).  Nuclear stress EF: 46%.  There was no ST segment deviation noted during stress.  This is a low risk study.   Low risk stress nuclear study with normal perfusion and mildly reduced left ventricular global systolic function. Findings are most compatible with nonischemic cardiomyopathy. Past Medical History:  Diagnosis Date  . AAA (abdominal aortic aneurysm) without rupture (HCC) 06/2014   no change in last exam from 2015  . Achalasia   . Arthritis    knees,lower back  . Atrial  fibrillation (HCC)   . Atrial flutter with rapid ventricular response (HCC) 01/16/2015  . Bilateral wrist pain 09/09/2014  . Bladder outlet obstruction 01/20/2015  . Bradycardia 08/27/2016  . CAD (coronary artery disease)    pt denies  . Carotid artery disease (HCC)   . Chronic back pain   . Chronic kidney disease (CKD), stage III (moderate)   . Decreased visual acuity of left eye  12/02/2018  . Dyspnea    reports today no pregoression  . Erectile dysfunction   . GERD (gastroesophageal reflux disease)   . Headache    hx of  . History of CVA (cerebrovascular accident)    2009  &  2013  . History of Multiple lacunar infarcts (HCC) 01/31/2015   MRI Brain 01/31/2015: Widespread old lacunar infarcts, particularly right Basal ganglia, pons, and right cerebellum  . HLD (hyperlipidemia)   . Hypertension   . Idiopathic pancreatitis 12/2014   acute  . Knee pain, bilateral 04/09/2011  . Malnutrition of moderate degree 01/25/2015  . Memory impairment    mild dementia per PCP progress note   . Morbid obesity (HCC) 12/03/2018  . Other abnormalities of gait and mobility 12/03/2018  . Pre-diabetes    per pt. no meds  . PVD (peripheral vascular disease) with claudication (HCC)   . Urethral stricture 02/16/2013  . Urge incontinence 12/08/2018    Past Surgical History:  Procedure Laterality Date  . back injections     [facility]  . CARDIOVASCULAR STRESS TEST  08-09-2009   mild global hypokinesis,  no ischemia or scar/  ef 41%  . CATARACT EXTRACTION W/ INTRAOCULAR LENS  IMPLANT, BILATERAL  april and may 2014  . CYSTOSCOPY N/A 01/17/2017   Procedure: CYSTOSCOPY FLEXIBLE;  Surgeon: Crist Fat, MD;  Location[facility];  Service: Urology;  Laterality: N/A;  . CYSTOSCOPY WITH RETROGRADE URETHROGRAM N/A 11/24/2014   Procedure: CYSTOSCOPY WITH RETROGRADE URETHROGRAM;  Surgeon: Crist Fat, MD;  Location: [facility];  Service: Urology;  Laterality: N/A;  .  CYSTOSCOPY WITH URETHRAL DILATATION N/A 11/24/2014   Procedure: CYSTOSCOPY WITH URETHRAL DILATATION;  Surgeon: Crist Fat, MD;  Location[facility];  Service: Urology;  Laterality: N/A;  BALLOON DILATION        . ESOPHAGOGASTRODUODENOSCOPY N/A 05/15/2014   Procedure: ESOPHAGOGASTRODUODENOSCOPY (EGD);  Surgeon: Carman Ching, MD;  Location: [facility] ENDOSCOPY;  Service: Endoscopy;  Laterality: N/A;  . ESOPHAGOGASTRODUODENOSCOPY Left 02/02/2015   Procedure: ESOPHAGOGASTRODUODENOSCOPY (EGD);  Surgeon: Dorena Cookey, MD;  Location[facility] ENDOSCOPY;  Service: Endoscopy;  Laterality: Left;  . ESOPHAGOGASTRODUODENOSCOPY Left 03/16/2015   Procedure: ESOPHAGOGASTRODUODENOSCOPY (EGD);  Surgeon: Dorena Cookey, MD;  Location: [facility] ENDOSCOPY;  Service: Endoscopy;  Laterality: Left;  . TRANSTHORACIC ECHOCARDIOGRAM  01-07-2012   mild to moderate dilated LV,  ef 45-50%,  mild basal hypokinesis,  grade  I diastolic  dysfunction/  trivial AR/  mild MR  . URETHROPLASTY N/A 01/17/2017   Procedure: URETHEROPLASTY BUCCAL GRAFT AND BUCCAL MUCOSA GRAFT HARVEST;  Surgeon: Ardis Hughs, MD;  Location[facility];  Service: Urology;  Laterality: N/A;    MEDICATIONS: . gabapentin (NEURONTIN) 300 MG capsule  . acetaminophen (TYLENOL) 325 MG tablet  . amLODipine (NORVASC) 10 MG tablet  . atorvastatin (LIPITOR) 40 MG tablet  . citalopram (CELEXA) 40 MG tablet  . diclofenac sodium (VOLTAREN) 1 % GEL  . famotidine (PEPCID) 10 MG tablet  . ferrous sulfate 325 (65 FE) MG tablet  . finasteride (PROSCAR) 5 MG tablet  . lipase/protease/amylase (CREON) 12000 units CPEP capsule  . Rivaroxaban (XARELTO) 15 MG TABS tablet  . tamsulosin (FLOMAX) 0.4 MG CAPS capsule   No current facility-administered medications for this encounter.      Maia Plan [facility] Pre-Surgical Testing [phone] 12/14/18  1:38 PM

## 2018-12-16 ENCOUNTER — Encounter (HOSPITAL_COMMUNITY)
Admission: RE | Disposition: A | Discharge status: Home / Self Care | Payer: Self-pay | Source: Home / Self Care | Attending: Urology

## 2018-12-16 ENCOUNTER — Ambulatory Visit (HOSPITAL_COMMUNITY): Payer: Medicare Other | Admitting: Anesthesiology

## 2018-12-16 ENCOUNTER — Encounter (HOSPITAL_COMMUNITY): Payer: Self-pay | Admitting: *Deleted

## 2018-12-16 ENCOUNTER — Ambulatory Visit (HOSPITAL_COMMUNITY): Payer: Medicare Other | Admitting: Physician Assistant

## 2018-12-16 ENCOUNTER — Ambulatory Visit: Payer: Medicare Other | Admitting: Licensed Clinical Social Worker

## 2018-12-16 ENCOUNTER — Other Ambulatory Visit: Payer: Self-pay

## 2018-12-16 ENCOUNTER — Ambulatory Visit (HOSPITAL_COMMUNITY)
Admission: RE | Admit: 2018-12-16 | Discharge: 2018-12-16 | Disposition: A | Discharge status: Home / Self Care | Payer: Medicare Other | Attending: Urology | Admitting: Urology

## 2018-12-16 ENCOUNTER — Ambulatory Visit (HOSPITAL_COMMUNITY): Payer: Medicare Other

## 2018-12-16 DIAGNOSIS — Z789 Other specified health status: Secondary | ICD-10-CM

## 2018-12-16 DIAGNOSIS — F329 Major depressive disorder, single episode, unspecified: Secondary | ICD-10-CM | POA: Diagnosis not present

## 2018-12-16 DIAGNOSIS — I4891 Unspecified atrial fibrillation: Secondary | ICD-10-CM | POA: Insufficient documentation

## 2018-12-16 DIAGNOSIS — Z87891 Personal history of nicotine dependence: Secondary | ICD-10-CM | POA: Diagnosis not present

## 2018-12-16 DIAGNOSIS — I10 Essential (primary) hypertension: Secondary | ICD-10-CM | POA: Diagnosis not present

## 2018-12-16 DIAGNOSIS — I428 Other cardiomyopathies: Secondary | ICD-10-CM | POA: Diagnosis not present

## 2018-12-16 DIAGNOSIS — I129 Hypertensive chronic kidney disease with stage 1 through stage 4 chronic kidney disease, or unspecified chronic kidney disease: Secondary | ICD-10-CM | POA: Diagnosis not present

## 2018-12-16 DIAGNOSIS — R519 Headache, unspecified: Secondary | ICD-10-CM | POA: Diagnosis not present

## 2018-12-16 DIAGNOSIS — Z7982 Long term (current) use of aspirin: Secondary | ICD-10-CM | POA: Diagnosis not present

## 2018-12-16 DIAGNOSIS — M1990 Unspecified osteoarthritis, unspecified site: Secondary | ICD-10-CM | POA: Diagnosis not present

## 2018-12-16 DIAGNOSIS — K219 Gastro-esophageal reflux disease without esophagitis: Secondary | ICD-10-CM | POA: Insufficient documentation

## 2018-12-16 DIAGNOSIS — N35912 Unspecified bulbous urethral stricture, male: Secondary | ICD-10-CM | POA: Diagnosis not present

## 2018-12-16 DIAGNOSIS — I739 Peripheral vascular disease, unspecified: Secondary | ICD-10-CM | POA: Insufficient documentation

## 2018-12-16 DIAGNOSIS — N3080 Other cystitis without hematuria: Secondary | ICD-10-CM | POA: Diagnosis not present

## 2018-12-16 DIAGNOSIS — I480 Paroxysmal atrial fibrillation: Secondary | ICD-10-CM

## 2018-12-16 DIAGNOSIS — N3941 Urge incontinence: Secondary | ICD-10-CM

## 2018-12-16 DIAGNOSIS — I6930 Unspecified sequelae of cerebral infarction: Secondary | ICD-10-CM | POA: Insufficient documentation

## 2018-12-16 DIAGNOSIS — F0390 Unspecified dementia without behavioral disturbance: Secondary | ICD-10-CM | POA: Diagnosis not present

## 2018-12-16 DIAGNOSIS — I2510 Atherosclerotic heart disease of native coronary artery without angina pectoris: Secondary | ICD-10-CM | POA: Diagnosis not present

## 2018-12-16 DIAGNOSIS — Z7189 Other specified counseling: Secondary | ICD-10-CM

## 2018-12-16 DIAGNOSIS — Z719 Counseling, unspecified: Secondary | ICD-10-CM

## 2018-12-16 DIAGNOSIS — Z888 Allergy status to other drugs, medicaments and biological substances status: Secondary | ICD-10-CM | POA: Diagnosis not present

## 2018-12-16 DIAGNOSIS — I714 Abdominal aortic aneurysm, without rupture: Secondary | ICD-10-CM | POA: Insufficient documentation

## 2018-12-16 DIAGNOSIS — N189 Chronic kidney disease, unspecified: Secondary | ICD-10-CM | POA: Diagnosis not present

## 2018-12-16 DIAGNOSIS — E1122 Type 2 diabetes mellitus with diabetic chronic kidney disease: Secondary | ICD-10-CM | POA: Insufficient documentation

## 2018-12-16 DIAGNOSIS — Z79899 Other long term (current) drug therapy: Secondary | ICD-10-CM | POA: Insufficient documentation

## 2018-12-16 DIAGNOSIS — Z8249 Family history of ischemic heart disease and other diseases of the circulatory system: Secondary | ICD-10-CM | POA: Insufficient documentation

## 2018-12-16 DIAGNOSIS — D649 Anemia, unspecified: Secondary | ICD-10-CM | POA: Insufficient documentation

## 2018-12-16 DIAGNOSIS — R3912 Poor urinary stream: Secondary | ICD-10-CM | POA: Insufficient documentation

## 2018-12-16 DIAGNOSIS — Z7901 Long term (current) use of anticoagulants: Secondary | ICD-10-CM | POA: Insufficient documentation

## 2018-12-16 DIAGNOSIS — N35919 Unspecified urethral stricture, male, unspecified site: Secondary | ICD-10-CM | POA: Diagnosis present

## 2018-12-16 DIAGNOSIS — N35011 Post-traumatic bulbous urethral stricture: Secondary | ICD-10-CM | POA: Diagnosis not present

## 2018-12-16 HISTORY — PX: BALLOON DILATION: SHX5330

## 2018-12-16 HISTORY — PX: CYSTOSCOPY WITH RETROGRADE URETHROGRAM: SHX6309

## 2018-12-16 LAB — GLUCOSE, CAPILLARY: Glucose-Capillary: 103 mg/dL — ABNORMAL HIGH (ref 70–99)

## 2018-12-16 SURGERY — CYSTOSCOPY WITH RETROGRADE URETHROGRAM
Anesthesia: General | Site: Urethra

## 2018-12-16 MED ORDER — OXYCODONE HCL 5 MG/5ML PO SOLN: 5.0000 mg | Freq: Once | ORAL | Status: DC | PRN

## 2018-12-16 MED ORDER — CIPROFLOXACIN IN D5W 400 MG/200ML IV SOLN
400.0000 mg | INTRAVENOUS | Status: AC
  Administered 2018-12-16: 400 mg via INTRAVENOUS

## 2018-12-16 MED ORDER — CIPROFLOXACIN IN D5W 400 MG/200ML IV SOLN
INTRAVENOUS | Status: AC
  Filled 2018-12-16: qty 200

## 2018-12-16 MED ORDER — FENTANYL CITRATE (PF) 100 MCG/2ML IJ SOLN
INTRAMUSCULAR | Status: DC | PRN
  Administered 2018-12-16 (×2): 25 ug via INTRAVENOUS

## 2018-12-16 MED ORDER — LIDOCAINE 2% (20 MG/ML) 5 ML SYRINGE
INTRAMUSCULAR | Status: DC | PRN
  Administered 2018-12-16: 50 mg via INTRAVENOUS

## 2018-12-16 MED ORDER — LACTATED RINGERS IV SOLN
INTRAVENOUS | Status: DC
  Administered 2018-12-16: 13:00:00 via INTRAVENOUS

## 2018-12-16 MED ORDER — SODIUM CHLORIDE 0.9 % IR SOLN
Status: DC | PRN
  Administered 2018-12-16: 3000 mL

## 2018-12-16 MED ORDER — IOHEXOL 300 MG/ML  SOLN
INTRAMUSCULAR | Status: DC | PRN
  Administered 2018-12-16: 40 mL

## 2018-12-16 MED ORDER — 0.9 % SODIUM CHLORIDE (POUR BTL) OPTIME
TOPICAL | Status: DC | PRN
  Administered 2018-12-16: 1000 mL

## 2018-12-16 MED ORDER — PROPOFOL 10 MG/ML IV BOLUS
INTRAVENOUS | Status: DC | PRN
  Administered 2018-12-16: 50 mg via INTRAVENOUS
  Administered 2018-12-16: 90 mg via INTRAVENOUS

## 2018-12-16 MED ORDER — TRAMADOL HCL 50 MG PO TABS: 50.0000 mg | ORAL_TABLET | Freq: Four times a day (QID) | ORAL | 0 refills | Status: DC | PRN

## 2018-12-16 MED ORDER — FENTANYL CITRATE (PF) 100 MCG/2ML IJ SOLN: 25.0000 ug | INTRAMUSCULAR | Status: DC | PRN

## 2018-12-16 MED ORDER — LIDOCAINE 2% (20 MG/ML) 5 ML SYRINGE
INTRAMUSCULAR | Status: AC
  Filled 2018-12-16: qty 5

## 2018-12-16 MED ORDER — PROPOFOL 10 MG/ML IV BOLUS
INTRAVENOUS | Status: AC
  Filled 2018-12-16: qty 20

## 2018-12-16 MED ORDER — ONDANSETRON HCL 4 MG/2ML IJ SOLN
INTRAMUSCULAR | Status: AC
  Filled 2018-12-16: qty 2

## 2018-12-16 MED ORDER — OXYCODONE HCL 5 MG PO TABS: 5.0000 mg | ORAL_TABLET | Freq: Once | ORAL | Status: DC | PRN

## 2018-12-16 MED ORDER — RIVAROXABAN 15 MG PO TABS: 20.0000 mg | ORAL_TABLET | Freq: Every day | ORAL | Status: DC

## 2018-12-16 MED ORDER — FENTANYL CITRATE (PF) 100 MCG/2ML IJ SOLN
INTRAMUSCULAR | Status: AC
  Filled 2018-12-16: qty 2

## 2018-12-16 MED ORDER — DEXAMETHASONE SODIUM PHOSPHATE 10 MG/ML IJ SOLN
INTRAMUSCULAR | Status: AC
  Filled 2018-12-16: qty 1

## 2018-12-16 MED ORDER — ONDANSETRON HCL 4 MG/2ML IJ SOLN
INTRAMUSCULAR | Status: DC | PRN
  Administered 2018-12-16: 4 mg via INTRAVENOUS

## 2018-12-16 MED ORDER — ONDANSETRON HCL 4 MG/2ML IJ SOLN: 4.0000 mg | Freq: Once | INTRAMUSCULAR | Status: DC | PRN

## 2018-12-16 SURGICAL SUPPLY — 20 items
BAG URINE DRAIN 2000ML AR STRL (UROLOGICAL SUPPLIES) ×3 IMPLANT
BALLN NEPHROSTOMY (BALLOONS) ×3
BALLOON NEPHROSTOMY (BALLOONS) ×1 IMPLANT
CATH FOLEY 2W COUNCIL 20FR 5CC (CATHETERS) IMPLANT
CATH FOLEY 2WAY SLVR  5CC 18FR (CATHETERS) ×2
CATH FOLEY 2WAY SLVR 5CC 18FR (CATHETERS) ×1 IMPLANT
CATH INTERMIT  6FR 70CM (CATHETERS) ×3 IMPLANT
CATH ROBINSON RED A/P 14FR (CATHETERS) IMPLANT
CATH URET 5FR 28IN CONE TIP (BALLOONS)
CATH URET 5FR 70CM CONE TIP (BALLOONS) IMPLANT
CLOTH BEACON ORANGE TIMEOUT ST (SAFETY) ×3 IMPLANT
GLOVE BIO SURGEON STRL SZ7.5 (GLOVE) ×3 IMPLANT
GOWN STRL REUS W/TWL XL LVL3 (GOWN DISPOSABLE) ×3 IMPLANT
GUIDEWIRE ANG ZIPWIRE 038X150 (WIRE) IMPLANT
GUIDEWIRE STR DUAL SENSOR (WIRE) ×3 IMPLANT
KIT TURNOVER KIT A (KITS) ×3 IMPLANT
MANIFOLD NEPTUNE II (INSTRUMENTS) IMPLANT
NS IRRIG 1000ML POUR BTL (IV SOLUTION) IMPLANT
PACK CYSTO (CUSTOM PROCEDURE TRAY) ×3 IMPLANT
WATER STERILE IRR 3000ML UROMA (IV SOLUTION) ×3 IMPLANT

## 2018-12-16 NOTE — H&P (Signed)
urethral stricture   HPI: Brian Burns is a 73 year-old male established patient who is here for further eval and management of a urethral stricture.  He does have a history of urethral strictures. He has had dilation, urethrotomy, and urethroplasty for the treatment of his urethral stricture . He does not have to strain or bear down to start his urinary stream. He does not have a split stream when he urinates. He does not have a good size and strength to his urinary stream. He does not have frequency. He is not having problems with emptying his bladder well. He has had the symptoms for 3 years.   He has not been on antibiotics for urethral infections previously. He does not have a previous history of sexually transmitted diseases. He has not had a history of trauma to the urethra. He has not received radiation therapy. His symptoms have gotten worse over the last year.   He has previously had an indwelling catheter in for more than two weeks at a time. He does not catheterize intermittently.   Balloon Dilation in 10/2014 - quick recurrence. Ultimately he settled down - still refused CIC. Last PVR in April 2017 was 127cc.  01/16/17 - Buccal graft urethral plasty.  PSA: 5/17 - 2.56 04/2016- 0.96   April 2019 Interval: The patient states that his symptoms have improved significantly since undergoing that urethroplasty. At this point, he has a strong stream, less frequent trips to the bathroom, and gets up less at night. He denies any hematuria or dysuria. He feels otherwise quite well. He continues to take tamsulosin and finasteride for his symptoms.   Interval: Seen 2 weeks ago for concern of recurrence of his stricture the patient was also noted to have an infection. We treated him for 2 weeks with Augmentin and he is follows up today for cystoscopy to re-evaluate the area of the stricture. He states that his symptoms are a little better, but he continues to have a weak stream and is having a  difficult time emptying his bladder.     ALLERGIES: Xylocaine SOLN     Notes: per pt it didn't work for anesthesia during dental procedure    MEDICATIONS: Ultram 50 mg tablet 1-2 tablet PO Q 6 H  AmLODIPine Besylate 10 MG Oral Tablet Oral  Aspirin Low Dose 81 MG TABS Oral  Atorvastatin Calcium 40 MG Oral Tablet Oral  Citalopram Hbr 20 mg tablet Oral  Finasteride 5 mg tablet 0 Oral  Gabapentin 100 MG Oral Capsule Oral  Meclizine Hcl 25 mg tablet  Meclizine Hcl 25 mg tablet Oral  Peridex 0.12 % mouthwash 15 mL swish and spit twice daily  Tamsulosin HCl - 0.4 MG Oral Capsule 0 Oral Daily  TraMADol HCl - 50 MG Oral Tablet Oral  Warfarin Sodium 10 mg tablet Oral     GU PSH: Anastomotic Urethroplasty - 01/17/2017 Cysto Dilate Stricture (M or F) - 2016 Cystoscopy - 2018 Injection For Bladder X-ray - 2019 Skin Full Graft - 01/17/2017       PSH Notes: Cystoscopy For Urethral Stricture, No Surgical Problems   NON-GU PSH: None   GU PMH: Bulbar urethral stricture - 11/16/2018 Gross hematuria - 11/06/2018 Anterior urethral stricture - 2018 Urethral Stricture, Unspec, The patient has a history of a soft distal or anterior urethral stricture which we dilated once in the patient subsequently recurred quite quickly. This point, the patient is interested in having this repaired via a urethroplasty given the severity of his  symptoms. 05-14-2016, Urethral stricture, - 2015/05/15 Postinfective bulbous urethral stricture, not elsewhere classified, male - 2015/05/15 Chronic Kidney Disease, Chronic renal failure, unspecified stage 15-May-2015 Urinary Frequency, Increased urinary frequency - 2015-05-15 Microscopic hematuria, Asymptomatic microscopic hematuria - 05/15/2014 Nocturia, Nocturia - 05/14/2013 Other microscopic hematuria, Microscopic hematuria - 05-14-2013      PMH Notes:  2012-08-14 14:58:45 - Note: Arthritis  AAA  CAD  Carotid artery disease  idiopathic pancreatitis acute 05/15/2014  DM II no meds  LVEF 50-55% per cards eval  11/06/16   NON-GU PMH: Encounter for general adult medical examination without abnormal findings, Encounter for preventive health examination - May 15, 2015 Cerebral infarction, unspecified, Stroke Syndrome - May 14, 2012 Decreased libido, Decreased libido - May 14, 2012 Personal history of other diseases of the circulatory system, History of hypertension - 2012/05/14 Personal history of other diseases of the digestive system, History of esophageal reflux - 14-May-2012 Personal history of other diseases of the nervous system and sense organs, History of glaucoma - 2012/05/14 Personal history of other endocrine, nutritional and metabolic disease, History of hypercholesterolemia - 05-14-12    FAMILY HISTORY: Death In The Family Father - Runs In Family Death In The Family Mother - Runs In Family Heart Disease - Mother   SOCIAL HISTORY: Marital Status: Married Preferred Language: English; Ethnicity: Not Hispanic Or Latino; Race: Black or African American Current Smoking Status: Patient does not smoke anymore. Has not smoked since 12/31/2009. Smoked for 40 years. Smoked 1/2 pack per day.   Tobacco Use Assessment Completed: Used Tobacco in last 30 days? Does not use smokeless tobacco. Does not drink anymore.  Does not use drugs. Drinks 1 caffeinated drink per day.     Notes: Former smoker, History of tobacco use, Alcohol Use, Caffeine Use, Occupation: Retired, Marital History - Currently Married   REVIEW OF SYSTEMS:    GU Review Male:   Patient denies frequent urination, hard to postpone urination, burning/ pain with urination, get up at night to urinate, leakage of urine, stream starts and stops, trouble starting your stream, have to strain to urinate , erection problems, and penile pain.  Gastrointestinal (Upper):   Patient denies indigestion/ heartburn, nausea, and vomiting.  Gastrointestinal (Lower):   Patient denies diarrhea and constipation.  Constitutional:   Patient denies fever, night sweats, weight loss, and fatigue.  Skin:    Patient denies skin rash/ lesion and itching.  Eyes:   Patient denies blurred vision and double vision.  Ears/ Nose/ Throat:   Patient denies sore throat and sinus problems.  Hematologic/Lymphatic:   Patient denies swollen glands and easy bruising.  Cardiovascular:   Patient denies leg swelling and chest pains.  Respiratory:   Patient denies cough and shortness of breath.  Endocrine:   Patient denies excessive thirst.  Musculoskeletal:   Patient denies back pain and joint pain.  Neurological:   Patient denies headaches and dizziness.  Psychologic:   Patient denies depression and anxiety.   VITAL SIGNS:      12/01/2018 02:04 PM  Weight 136 lb / 61.69 kg  Height 76 in / 193.04 cm  BP 100/75 mmHg  Pulse 86 /min  Temperature 97.5 F / 36.3 C  BMI 16.6 kg/m   MULTI-SYSTEM PHYSICAL EXAMINATION:    Constitutional: Well-nourished. No physical deformities. Normally developed. Good grooming.  Respiratory: No labored breathing, no use of accessory muscles.   Cardiovascular: Regular rate and rhythm. No murmur, no gallop. Normal temperature, normal extremity pulses, no swelling, no varicosities.      PAST DATA  REVIEWED:  Source Of History:  Patient  Records Review:   Previous Doctor Records, Previous Hospital Records, Previous Patient Records, POC Tool  Urine Test Review:   Urinalysis   05/08/16 06/21/15  PSA  Total PSA 0.96 ng/dl 8.85     02/77/41  Hormones  Testosterone, Total 467     PROCEDURES:         Flexible Cystoscopy - 52000  Risks, benefits, and some of the potential complications of the procedure were discussed at length with the patient including infection, bleeding, voiding discomfort, urinary retention, fever, chills, sepsis, and others. All questions were answered. Informed consent was obtained. Sterile technique and intraurethral analgesia were used.  Meatus:  Normal size. Normal location. Normal condition.  Urethra:  Dense bulbar stricture, unable to pass with scope.       The lower urinary tract was carefully examined. The procedure was well-tolerated and without complications. Antibiotic instructions were given. Instructions were given to call the office immediately for bloody urine, difficulty urinating, urinary retention, painful or frequent urination, fever, chills, nausea, vomiting or other illness. The patient stated that he understood these instructions and would comply with them.         Urinalysis w/Scope Dipstick Dipstick Cont'd Micro  Color: Brown Bilirubin: Neg mg/dL WBC/hpf: 10 - 28/[facility]  Appearance: Cloudy Ketones: Neg mg/dL RBC/hpf: 40 - 76/[facility]  Specific Gravity: 1.025 Blood: 3+ ery/uL Bacteria: NS (Not Seen)  pH: 6.0 Protein: 2+ mg/dL Cystals: NS (Not Seen)  Glucose: Neg mg/dL Urobilinogen: 1.0 mg/dL Casts: NS (Not Seen)    Nitrites: Neg Trichomonas: Not Present    Leukocyte Esterase: 3+ leu/uL Mucous: Not Present      Epithelial Cells: 0 - 5/hpf      Yeast: NS (Not Seen)      Sperm: Not Present    ASSESSMENT:      ICD-10 Details  1 GU:   Urethral Stricture, Unspec - N35.9    PLAN:           Orders Labs Urine Culture          Document Letter(s):  Created for Patient: Clinical Summary         Notes:   The patient has a recurrence of his urethral stricture in the anterior/bulbar urethra, and as such I recommended urethral balloon dilation. The patient would like to do this in the operating room. We went through the procedure in detail and he has agreed to proceed. Will send a urine culture today to ensure that he is treated for any infection prior to the procedure.

## 2018-12-16 NOTE — Discharge Instructions (Signed)
Foley Catheter Care, Adult A soft, flexible tube (Foley catheter) has been placed in your bladder. This may be done to temporarily help with urine drainage after an operation or to relieve blockage from an enlarged prostate gland. HOME CARE INSTRUCTIONS  If you are going home with a Foley catheter in place, follow these instructions: Taking Care of the Catheter: Keep the area where the catheter leaves your body clean. Attach the catheter to the leg so there is no tension on the catheter. Keep the drainage bag below the level of the bladder, but keep it OFF the floor. Do not take long soaking baths.  Wash your hands before touching ANYTHING related to the catheter or bag. Using mild soap and warm water on a washcloth: Clean the area closest to the catheter insertion site using a circular motion around the catheter. Clean the catheter itself by wiping AWAY from the insertion site for several inches down the tube. NEVER wipe upward as this could sweep bacteria up into the urethra (tube in your body that normally drains the bladder) and cause infection. Taking Care of the Drainage Bags: Two drainage bags will be taken home: a large overnight drainage bag, and a smaller leg bag which fits underneath clothing. It is okay to wear the overnight bag at any time, but NEVER wear the smaller leg bag at night. Keep the drainage bag well below the level of your bladder. This prevents backflow of urine into the bladder and allows the urine to drain freely. Anchor the tubing to your leg to prevent pulling or tension on the catheter. Use tape or a leg strap provided by the hospital. Empty the drainage bag when it is  to  full. Wash your hands before and after touching the bag. Periodically check the tubing for kinks to make sure there is no pressure on the tubing which could restrict the flow of urine. Changing the Drainage Bags: Cleanse both ends of the clean bag with alcohol before changing. Pinch off the  rubber catheter to avoid urine spillage during the disconnection. Disconnect the dirty bag and connect the clean one. Empty the dirty bag carefully to avoid a urine spill. Attach the new bag to the leg with tape or a leg strap. Cleaning the Drainage Bags: Whenever a drainage bag is disconnected, it must be cleaned quickly so it is ready for the next use. Wash the bag in warm, soapy water. Rinse the bag thoroughly with warm water. Soak the bag for 30 minutes in a solution of white vinegar and water (1 cup vinegar to 1 quart warm water). Rinse with warm water. SEEK MEDICAL CARE IF:  Some pain develops in the kidney (lower back) area. The urine is cloudy or smells bad. There is some blood in the urine. The catheter becomes clogged and/or there is no urine drainage. SEEK IMMEDIATE MEDICAL CARE IF:  You have moderate or severe pain in the kidney region. You start to throw up (vomit). Blood fills the tube. Worsening belly (abdominal) pain develops. You have a fever. MAKE SURE YOU:  Understand these instructions. Will watch your condition. Will get help right away if you are not doing well or get worse.  Call [facility] if you have any questions or concerns: [phone]    

## 2018-12-16 NOTE — Progress Notes (Signed)
Subjective: Brian Burns presents to clinic with cc of painful corn/callus and mycotic toenails b/l which are aggravated when weightbearing with and without shoe gear.  This pain limits his daily activities. Pain symptoms resolve with periodic professional debridement.  He voices no new pedal concerns on today's visit.  Mullis, Kiersten P, DO is his PCP.   Current Outpatient Medications on File Prior to Visit  Medication Sig Dispense Refill  . acetaminophen (TYLENOL) 325 MG tablet Take 325 mg by mouth every 6 (six) hours as needed for moderate pain.    Marland Kitchen amLODipine (NORVASC) 10 MG tablet Take 1 tablet (10 mg total) by mouth daily. 90 tablet 3  . atorvastatin (LIPITOR) 40 MG tablet Take 1 tablet (40 mg total) by mouth daily. 90 tablet 1  . citalopram (CELEXA) 40 MG tablet TAKE 1 TABLET(40 MG) BY MOUTH DAILY (Patient taking differently: Take 40 mg by mouth at bedtime. ) 90 tablet 1  . diclofenac sodium (VOLTAREN) 1 % GEL Apply 4 g topically 4 (four) times daily. (Patient not taking: Reported on 12/03/2018) 350 g 1  . famotidine (PEPCID) 10 MG tablet Take 10 mg by mouth as needed for heartburn or indigestion.    . ferrous sulfate 325 (65 FE) MG tablet take 1 tablet by mouth three times a day with meals (Patient taking differently: Take 325 mg by mouth every other day. At night) 90 tablet 3  . finasteride (PROSCAR) 5 MG tablet Take 1 tablet (5 mg total) by mouth daily. (Patient taking differently: Take 5 mg by mouth at bedtime. ) 90 tablet 2  . lipase/protease/amylase (CREON) 12000 units CPEP capsule take 1 capsule by mouth three times a day with meals (Patient taking differently: Take 12,000 Units by mouth 3 (three) times daily before meals. ) 270 capsule 3  . Rivaroxaban (XARELTO) 15 MG TABS tablet Take 1 tablet (15 mg total) by mouth 2 (two) times daily with a meal. With a meal (Patient taking differently: Take 20 mg by mouth at bedtime. With a meal) 90 tablet 3  . tamsulosin (FLOMAX) 0.4 MG CAPS  capsule TAKE 1 CAPSULE(0.4 MG) BY MOUTH DAILY (Patient taking differently: Take 0.4 mg by mouth daily. ) 30 capsule 3   No current facility-administered medications on file prior to visit.      Allergies  Allergen Reactions  . Gabapentin Other (See Comments)    Sedation at 300mg  TID     Objective: 73 y.o. AAM WD, WN in NAD. AAO x 3.   Physical Examination:  Vascular  Examination: Capillary refill time immediate x 10 digits.  Palpable DP/PT pulses b/l.  Digital hair absent b/l.  No edema noted b/l.  Skin temperature gradient WNL b/l.  Dermatological Examination: Skin with normal turgor, texture and tone b/l.  No open wounds b/l.  No interdigital macerations noted b/l.  Elongated, thick, discolored brittle toenails with subungual debris and pain on dorsal palpation of nailbeds 1-5 b/l.  Hyperkeratotic lesions b/l hallux, medial left hallux and lateral right hallux with tenderness to palpation. No edema, no erythema, no drainage, no flocculence.   Musculoskeletal Examination: Muscle strength 5/5 to all muscle groups b/l.  No pain, crepitus or joint discomfort with active/passive ROM.  Neurological Examination: Sensation intact 5/5 b/l with 10 gram monofilament.  Assessment: 1. Mycotic nail infection with pain 1-5 b/l 2. Corn right hallux 3. Callus left hallux 4. NIDDM  Plan: 1. Toenails 1-5 b/l were debrided in length and girth without iatrogenic laceration. 2. Calluses pared  left hallux utilizing sterile scalpel blade without incident. Corn(s) right halux pared utilizing sterile scalpel blade without incident. 3. Continue soft, supportive shoe gear daily. 4. Report any pedal injuries to medical professional. 5. Follow up 3 months. 6. Patient/POA to call should there be a question/concern in there interim.

## 2018-12-16 NOTE — Anesthesia Procedure Notes (Signed)
Procedure Name: LMA Insertion Date/Time: 12/16/2018 1:49 PM Performed by: Anne Fu, CRNA Pre-anesthesia Checklist: Patient identified, Emergency Drugs available, Suction available, Patient being monitored and Timeout performed Patient Re-evaluated:Patient Re-evaluated prior to induction Oxygen Delivery Method: Circle system utilized Preoxygenation: Pre-oxygenation with 100% oxygen Induction Type: IV induction Ventilation: Mask ventilation without difficulty LMA: LMA inserted LMA Size: 5.0 Number of attempts: 1 Placement Confirmation: positive ETCO2 and breath sounds checked- equal and bilateral Tube secured with: Tape

## 2018-12-16 NOTE — Transfer of Care (Addendum)
Immediate Anesthesia Transfer of Care Note  Patient: Brian Burns  Procedure(s) Performed: Procedure(s): CYSTOSCOPY WITH RETROGRADE URETHROGRAM (N/A) URETHRAL BALLOON DILATION (N/A)  Patient Location: PACU  Anesthesia Type:General  Level of Consciousness:  sedated, patient cooperative and responds to stimulation  Airway & Oxygen Therapy:Patient Spontanous Breathing and Patient connected to face mask oxgen  Post-op Assessment:  Report given to PACU RN and Post -op Vital signs reviewed and stable  Post vital signs:  Reviewed and stable  Last Vitals:  Vitals:   12/16/18 1515 12/16/18 1530  BP: (!) 157/85 (!) 157/85  Pulse: (!) 43 (!) 43  Resp: 12 11  Temp:    SpO2: 407% 680%    Complications: No apparent anesthesia complications

## 2018-12-16 NOTE — Chronic Care Management (AMB) (Signed)
  Social Work Care Management  Follow Up Note   12/16/2018 Name: Brian Burns MRN: [id number] DOB: 02-May-1945  Referred by: Danna Hefty, DO Reason for referral : Care Coordination (F/U call from [facility])  Brian Burns is a 73 y.o. year old male who is a primary care patient of Danna Hefty, DO.   F/U call to patient's daughter from care management team for ongoing assessment and assistance with care coordinations needs after [facility]. Daughter appreciative of F/U call and ongoing assistance.  Review of patient status, including review of consultants reports, relevant laboratory and other test results, and collaboration with appropriate care team members and the patient's provider was performed as part of comprehensive patient evaluation and provision of care management services.    SDOH (Social Determinants of Health) screening performed today with no new needs identified. See Care Plan for related entries. Advanced Directives: See Care Plan and [employer] application for related entries.  Goals Addressed            This Visit's Progress   . COMPLETED: Advance directives   On track    Current Barriers:  . Literacy Barriers  Clinical Social Work Clinical Goal(s):  . the patient will have Advance Directive notarized today while in office and provide a copy to his provider   Interventions: . A voluntary discussion about advanced care planning including explanation and discussion of advanced directives was extentively discussed with the patient and his daughtet.  Explanation regarding healthcare proxy and living will was reviewed and packet with forms with expiration of how to fill them out was given.    Patient Self Care Activities:  . Can read and write at 6th grading reading level . Is able to readily make contact with social support system . Performs ADLs independently  Initial goal documentation      . Demetia Education       Current Barriers:  .  Limited education about dementia Clinical Social Work Clinical Goal(s):  . Be available for patient and daughter with questions for community support as patient disease progresses. Interventions: . Provided patient 's daughter will education on Caring for a Person with Alzheimer's Disease . Reviewed community support options Patient Self Care Activities:  . Calls provider office for new concerns or questions  Initial goal documentation      . COMPLETED: food resources   On track    Current Barriers:  . Financial constraints  Clinical Social Work Clinical Goal(s):  Marland Kitchen Over the next 30 days, patient will follow up with [employer] food program* as directed by SW Interventions: . Provided food boxes today and made referral for monthly pickup Patient Self Care Activities:  . Self administers medications as prescribed . Performs ADL's independently . Requires assistance and support from family  Initial goal documentation      Follow up plan:  The patient's daughter has been provided with contact information for the care management team and has been advised to call with any health related questions or concerns.  No further follow up required: by LCSW at this time.   Casimer Lanius, LCSW Clinical Social Worker [place] [facility] [place]   [phone] 10:16 AM

## 2018-12-16 NOTE — Anesthesia Preprocedure Evaluation (Addendum)
Anesthesia Evaluation    Reviewed: Allergy & Precautions, Patient's Chart, lab work & pertinent test results  History of Anesthesia Complications Negative for: history of anesthetic complications  Airway Mallampati: II  TM Distance: >3 FB Neck ROM: Full    Dental  (+) Edentulous Upper, Edentulous Lower   Pulmonary former smoker,    Pulmonary exam normal        Cardiovascular hypertension, Pt. on medications (-) angina+ CAD and + Peripheral Vascular Disease  Normal cardiovascular exam+ dysrhythmias Atrial Fibrillation      Neuro/Psych  Headaches, PSYCHIATRIC DISORDERS Depression Dementia CVA, Residual Symptoms    GI/Hepatic Neg liver ROS, GERD  Medicated and Controlled,  Endo/Other   Pre-DM   Renal/GU CRFRenal disease     Musculoskeletal  (+) Arthritis ,   Abdominal   Peds  Hematology  (+) anemia ,   Anesthesia Other Findings   Reproductive/Obstetrics                            Anesthesia Physical Anesthesia Plan  ASA: III  Anesthesia Plan: General   Post-op Pain Management:    Induction: Intravenous  PONV Risk Score and Plan: 2 and Treatment may vary due to age or medical condition, Ondansetron and Dexamethasone  Airway Management Planned: LMA  Additional Equipment: None  Intra-op Plan:   Post-operative Plan: Extubation in OR  Informed Consent: I have reviewed the patients History and Physical, chart, labs and discussed the procedure including the risks, benefits and alternatives for the proposed anesthesia with the patient or authorized representative who has indicated his/her understanding and acceptance.     Dental advisory given  Plan Discussed with: CRNA and Anesthesiologist  Anesthesia Plan Comments:        Anesthesia Quick Evaluation

## 2018-12-16 NOTE — Patient Instructions (Signed)
Licensed Clinical Social Worker Visit Information Please call me directly if you have questions [phone] Goals we discussed today:  Goals Addressed            This Visit's Progress   . COMPLETED: Advance directives   On track    Current Barriers:  . Literacy Barriers  Clinical Social Work Clinical Goal(s):  . the patient will have Advance Directive notarized today while in office and provide a copy to his provider   Interventions: . A voluntary discussion about advanced care planning including explanation and discussion of advanced directives was extentively discussed with the patient and his daughtet.  Explanation regarding healthcare proxy and living will was reviewed and packet with forms with expiration of how to fill them out was given.    Patient Self Care Activities:  . Can read and write at 6th grading reading level . Is able to readily make contact with social support system . Performs ADLs independently  Initial goal documentation      . Demetia Education       Current Barriers:  . Limited education about dementia Clinical Social Work Clinical Goal(s):  . Be available for patient and daughter with questions for community support as patient disease progresses. Interventions: . Provided patient 's daughter will education on Caring for a Person with Alzheimer's Disease . Reviewed community support options Patient Self Care Activities:  . Calls provider office for new concerns or questions  Initial goal documentation      . COMPLETED: food resources   On track    Current Barriers:  . Financial constraints  Clinical Social Work Clinical Goal(s):  Marland Kitchen Over the next 30 days, patient will follow up with [employer] food program* as directed by SW Interventions: . Provided food boxes today and made referral for monthly pickup Patient Self Care Activities:  . Self administers medications as prescribed . Performs ADL's independently . Requires assistance and  support from family  Initial goal documentation        Materials provided: None current this visit  Brian Burns daughter was given information about Care Management services today including:  1. Care Management services include personalized support from designated clinical staff supervised by his physician, including individualized plan of care and coordination with other care providers 2. 24/7 contact [phone] for assistance for urgent and routine care needs. 3. Care Management services at any time by phone call to the office staff.  The patient verbalized understanding of instructions provided today and declined a print copy of patient instruction materials.  Follow up plan: .Patient and daughter will contact office as needed  Brian Cane, LCSW

## 2018-12-16 NOTE — Op Note (Signed)
Preoperative diagnosis:  1. Recurrent urethral stricture, bulbar  Postoperative diagnosis:  1. Same  Procedure: 1. Retrograde urethrogram with interpretation 2. Urethral balloon dilation 3. Cystoscopy  Surgeon: Ardis Hughs, MD  Anesthesia: General  Complications: None  Intraoperative findings:  #1: The retrograde urethrogram demonstrated a centimeter long stricture that was quite narrow and in the penile urethra/bulbar urethra. #2: The bladder was mildly trabeculated with orthotopic ureters.  There was no tumors or significant abnormalities.  There was evidence of cystitis cystica. #3: Urethroscopy demonstrated significant urethral trauma from the balloon dilation. #4: An 46 French Foley catheter was placed over wire.  EBL: Minimal  Specimens: None  Indication: Brian Burns is a 73 y.o. patient with history of urethral stricture.  After reviewing the management options for treatment, he elected to proceed with the above surgical procedure(s). We have discussed the potential benefits and risks of the procedure, side effects of the proposed treatment, the likelihood of the patient achieving the goals of the procedure, and any potential problems that might occur during the procedure or recuperation. Informed consent has been obtained.  Description of procedure:  The patient was taken to the operating room and general anesthesia was induced.  The patient was placed in the dorsal lithotomy position, prepped and draped in the usual sterile fashion, and preoperative antibiotics were administered. A preoperative time-out was performed.   An 62 French Foley catheter was gently passed into the fossa navicularis and pinched.  The C-arm was then brought in and obliqued to approximately 15 degrees.  I then injected approximately 30 cc of contrast into the patient's urethra noting the above findings.  I then inserted a 21 French 30 degree cystoscope and encountered the patient's  urethral stricture in the proximal penile/distal bulbar urethra.  I advanced a wire through the cystoscope and through the lumen of the urethra.  This then was advanced into the bladder under fluoroscopic guidance.  I then remove the scope and advanced a 24 French balloon dilating kit.  The balloon was advanced across the urethral stricture and then inflated to 14 cm H2O.  It was allowed to dwell deployed for approximately 45 seconds before I took the balloon down and tried to pass the scope.  I was able to pass the scope over the wire but with some difficulty.  The urethra was blanched with ragged urethral mucosa.  The prostatic urethra was normal.  The bladder was as described above.  I then slowly backed out the scope with no other abnormalities noted.  I then advanced an 58 French Foley catheter over the wire and into the bladder atraumatically.  The wire was removed and the bladder emptied.  The patient was subsequently extubated return the PACU in good condition.  He will be discharged home with a Foley catheter schedule for trial of voiding in 2 weeks.  Ardis Hughs, M.D.

## 2018-12-16 NOTE — Interval H&P Note (Signed)
History and Physical Interval Note:  12/16/2018 1:33 PM  Brian Burns  has presented today for surgery, with the diagnosis of URETHRAL STRICTURE.  The various methods of treatment have been discussed with the patient and family. After consideration of risks, benefits and other options for treatment, the patient has consented to  Procedure(s): CYSTOSCOPY WITH RETROGRADE URETHROGRAM (N/A) URETHRAL BALLOON DILATION (N/A) as a surgical intervention.  The patient's history has been reviewed, patient examined, no change in status, stable for surgery.  I have reviewed the patient's chart and labs.  Questions were answered to the patient's satisfaction.     Ardis Hughs

## 2018-12-16 NOTE — Anesthesia Postprocedure Evaluation (Signed)
Anesthesia Post Note  Patient: Brian Burns  Procedure(s) Performed: CYSTOSCOPY WITH RETROGRADE URETHROGRAM (N/A Urethra) URETHRAL BALLOON DILATION (N/A Urethra)     Patient location during evaluation: PACU Anesthesia Type: General Level of consciousness: awake and alert Pain management: pain level controlled Vital Signs Assessment: post-procedure vital signs reviewed and stable Respiratory status: spontaneous breathing, nonlabored ventilation and respiratory function stable Cardiovascular status: blood pressure returned to baseline and stable Postop Assessment: no apparent nausea or vomiting Anesthetic complications: no    Last Vitals:  Vitals:   12/16/18 1615 12/16/18 1659  BP: (!) 154/89 129/86  Pulse: (!) 47 69  Resp: 13 18  Temp:  36.4 C  SpO2: 100% 100%    Last Pain:  Vitals:   12/16/18 1659  TempSrc: Oral  PainSc: 0-No pain                 Audry Pili

## 2018-12-17 ENCOUNTER — Encounter (HOSPITAL_COMMUNITY): Payer: Self-pay | Admitting: Urology

## 2018-12-19 DIAGNOSIS — I2510 Atherosclerotic heart disease of native coronary artery without angina pectoris: Secondary | ICD-10-CM | POA: Diagnosis not present

## 2018-12-19 DIAGNOSIS — E1151 Type 2 diabetes mellitus with diabetic peripheral angiopathy without gangrene: Secondary | ICD-10-CM | POA: Diagnosis not present

## 2018-12-19 DIAGNOSIS — I714 Abdominal aortic aneurysm, without rupture: Secondary | ICD-10-CM | POA: Diagnosis not present

## 2018-12-19 DIAGNOSIS — I429 Cardiomyopathy, unspecified: Secondary | ICD-10-CM | POA: Diagnosis not present

## 2018-12-19 DIAGNOSIS — H9193 Unspecified hearing loss, bilateral: Secondary | ICD-10-CM | POA: Diagnosis not present

## 2018-12-19 DIAGNOSIS — E785 Hyperlipidemia, unspecified: Secondary | ICD-10-CM | POA: Diagnosis not present

## 2018-12-19 DIAGNOSIS — I129 Hypertensive chronic kidney disease with stage 1 through stage 4 chronic kidney disease, or unspecified chronic kidney disease: Secondary | ICD-10-CM | POA: Diagnosis not present

## 2018-12-19 DIAGNOSIS — I480 Paroxysmal atrial fibrillation: Secondary | ICD-10-CM | POA: Diagnosis not present

## 2018-12-19 DIAGNOSIS — N189 Chronic kidney disease, unspecified: Secondary | ICD-10-CM | POA: Diagnosis not present

## 2018-12-19 DIAGNOSIS — G8929 Other chronic pain: Secondary | ICD-10-CM | POA: Diagnosis not present

## 2018-12-19 DIAGNOSIS — E1122 Type 2 diabetes mellitus with diabetic chronic kidney disease: Secondary | ICD-10-CM | POA: Diagnosis not present

## 2018-12-19 DIAGNOSIS — E1140 Type 2 diabetes mellitus with diabetic neuropathy, unspecified: Secondary | ICD-10-CM | POA: Diagnosis not present

## 2018-12-19 DIAGNOSIS — M545 Low back pain: Secondary | ICD-10-CM | POA: Diagnosis not present

## 2018-12-22 DIAGNOSIS — I2510 Atherosclerotic heart disease of native coronary artery without angina pectoris: Secondary | ICD-10-CM | POA: Diagnosis not present

## 2018-12-22 DIAGNOSIS — I714 Abdominal aortic aneurysm, without rupture: Secondary | ICD-10-CM | POA: Diagnosis not present

## 2018-12-22 DIAGNOSIS — I429 Cardiomyopathy, unspecified: Secondary | ICD-10-CM | POA: Diagnosis not present

## 2018-12-22 DIAGNOSIS — E785 Hyperlipidemia, unspecified: Secondary | ICD-10-CM | POA: Diagnosis not present

## 2018-12-22 DIAGNOSIS — I129 Hypertensive chronic kidney disease with stage 1 through stage 4 chronic kidney disease, or unspecified chronic kidney disease: Secondary | ICD-10-CM | POA: Diagnosis not present

## 2018-12-22 DIAGNOSIS — I480 Paroxysmal atrial fibrillation: Secondary | ICD-10-CM | POA: Diagnosis not present

## 2018-12-22 DIAGNOSIS — M545 Low back pain: Secondary | ICD-10-CM | POA: Diagnosis not present

## 2018-12-22 DIAGNOSIS — E1122 Type 2 diabetes mellitus with diabetic chronic kidney disease: Secondary | ICD-10-CM | POA: Diagnosis not present

## 2018-12-22 DIAGNOSIS — H9193 Unspecified hearing loss, bilateral: Secondary | ICD-10-CM | POA: Diagnosis not present

## 2018-12-22 DIAGNOSIS — N189 Chronic kidney disease, unspecified: Secondary | ICD-10-CM | POA: Diagnosis not present

## 2018-12-22 DIAGNOSIS — G8929 Other chronic pain: Secondary | ICD-10-CM | POA: Diagnosis not present

## 2018-12-22 DIAGNOSIS — E1140 Type 2 diabetes mellitus with diabetic neuropathy, unspecified: Secondary | ICD-10-CM | POA: Diagnosis not present

## 2018-12-22 DIAGNOSIS — E1151 Type 2 diabetes mellitus with diabetic peripheral angiopathy without gangrene: Secondary | ICD-10-CM | POA: Diagnosis not present

## 2018-12-29 DIAGNOSIS — N35011 Post-traumatic bulbous urethral stricture: Secondary | ICD-10-CM | POA: Diagnosis not present

## 2018-12-31 ENCOUNTER — Telehealth: Payer: Self-pay | Admitting: *Deleted

## 2018-12-31 DIAGNOSIS — E1122 Type 2 diabetes mellitus with diabetic chronic kidney disease: Secondary | ICD-10-CM | POA: Diagnosis not present

## 2018-12-31 DIAGNOSIS — I429 Cardiomyopathy, unspecified: Secondary | ICD-10-CM | POA: Diagnosis not present

## 2018-12-31 DIAGNOSIS — N189 Chronic kidney disease, unspecified: Secondary | ICD-10-CM | POA: Diagnosis not present

## 2018-12-31 DIAGNOSIS — I129 Hypertensive chronic kidney disease with stage 1 through stage 4 chronic kidney disease, or unspecified chronic kidney disease: Secondary | ICD-10-CM | POA: Diagnosis not present

## 2018-12-31 DIAGNOSIS — I2510 Atherosclerotic heart disease of native coronary artery without angina pectoris: Secondary | ICD-10-CM | POA: Diagnosis not present

## 2018-12-31 DIAGNOSIS — H9193 Unspecified hearing loss, bilateral: Secondary | ICD-10-CM | POA: Diagnosis not present

## 2018-12-31 DIAGNOSIS — M545 Low back pain: Secondary | ICD-10-CM | POA: Diagnosis not present

## 2018-12-31 DIAGNOSIS — I480 Paroxysmal atrial fibrillation: Secondary | ICD-10-CM | POA: Diagnosis not present

## 2018-12-31 DIAGNOSIS — G8929 Other chronic pain: Secondary | ICD-10-CM | POA: Diagnosis not present

## 2018-12-31 DIAGNOSIS — E785 Hyperlipidemia, unspecified: Secondary | ICD-10-CM | POA: Diagnosis not present

## 2018-12-31 DIAGNOSIS — I714 Abdominal aortic aneurysm, without rupture: Secondary | ICD-10-CM | POA: Diagnosis not present

## 2018-12-31 DIAGNOSIS — E1140 Type 2 diabetes mellitus with diabetic neuropathy, unspecified: Secondary | ICD-10-CM | POA: Diagnosis not present

## 2018-12-31 DIAGNOSIS — E1151 Type 2 diabetes mellitus with diabetic peripheral angiopathy without gangrene: Secondary | ICD-10-CM | POA: Diagnosis not present

## 2018-12-31 NOTE — Telephone Encounter (Signed)
Frankie from [facility] calling for PT verbal orders as follows:  1 time(s) weekly for 1 week(s), then re certify him for 1 time(s) weekly for 4 week(s)  You can leave verbal orders on confidential voicemail.  Christen Bame, CMA

## 2019-01-01 NOTE — Telephone Encounter (Signed)
Verbal PT orders left on confidential voicemail of Brian Burns at [facility] [phone]) as requested.

## 2019-01-08 ENCOUNTER — Other Ambulatory Visit: Payer: Self-pay

## 2019-01-08 DIAGNOSIS — I2510 Atherosclerotic heart disease of native coronary artery without angina pectoris: Secondary | ICD-10-CM | POA: Diagnosis not present

## 2019-01-08 DIAGNOSIS — E785 Hyperlipidemia, unspecified: Secondary | ICD-10-CM | POA: Diagnosis not present

## 2019-01-08 DIAGNOSIS — I429 Cardiomyopathy, unspecified: Secondary | ICD-10-CM | POA: Diagnosis not present

## 2019-01-08 DIAGNOSIS — E1122 Type 2 diabetes mellitus with diabetic chronic kidney disease: Secondary | ICD-10-CM | POA: Diagnosis not present

## 2019-01-08 DIAGNOSIS — I714 Abdominal aortic aneurysm, without rupture: Secondary | ICD-10-CM | POA: Diagnosis not present

## 2019-01-08 DIAGNOSIS — H9193 Unspecified hearing loss, bilateral: Secondary | ICD-10-CM | POA: Diagnosis not present

## 2019-01-08 DIAGNOSIS — I480 Paroxysmal atrial fibrillation: Secondary | ICD-10-CM | POA: Diagnosis not present

## 2019-01-08 DIAGNOSIS — M545 Low back pain: Secondary | ICD-10-CM | POA: Diagnosis not present

## 2019-01-08 DIAGNOSIS — N189 Chronic kidney disease, unspecified: Secondary | ICD-10-CM | POA: Diagnosis not present

## 2019-01-08 DIAGNOSIS — E1140 Type 2 diabetes mellitus with diabetic neuropathy, unspecified: Secondary | ICD-10-CM | POA: Diagnosis not present

## 2019-01-08 DIAGNOSIS — G8929 Other chronic pain: Secondary | ICD-10-CM | POA: Diagnosis not present

## 2019-01-08 DIAGNOSIS — E1151 Type 2 diabetes mellitus with diabetic peripheral angiopathy without gangrene: Secondary | ICD-10-CM | POA: Diagnosis not present

## 2019-01-08 DIAGNOSIS — I129 Hypertensive chronic kidney disease with stage 1 through stage 4 chronic kidney disease, or unspecified chronic kidney disease: Secondary | ICD-10-CM | POA: Diagnosis not present

## 2019-01-09 MED ORDER — CITALOPRAM HYDROBROMIDE 40 MG PO TABS: 40.0000 mg | ORAL_TABLET | Freq: Every day | ORAL | 2 refills | Status: DC

## 2019-01-11 ENCOUNTER — Other Ambulatory Visit: Payer: Self-pay

## 2019-01-12 MED ORDER — PANCRELIPASE (LIP-PROT-AMYL) 12000-38000 UNITS PO CPEP: ORAL_CAPSULE | ORAL | 3 refills | Status: DC

## 2019-01-14 DIAGNOSIS — E1122 Type 2 diabetes mellitus with diabetic chronic kidney disease: Secondary | ICD-10-CM | POA: Diagnosis not present

## 2019-01-14 DIAGNOSIS — E785 Hyperlipidemia, unspecified: Secondary | ICD-10-CM | POA: Diagnosis not present

## 2019-01-14 DIAGNOSIS — M545 Low back pain: Secondary | ICD-10-CM | POA: Diagnosis not present

## 2019-01-14 DIAGNOSIS — M25561 Pain in right knee: Secondary | ICD-10-CM | POA: Diagnosis not present

## 2019-01-14 DIAGNOSIS — N189 Chronic kidney disease, unspecified: Secondary | ICD-10-CM | POA: Diagnosis not present

## 2019-01-14 DIAGNOSIS — I480 Paroxysmal atrial fibrillation: Secondary | ICD-10-CM | POA: Diagnosis not present

## 2019-01-14 DIAGNOSIS — I129 Hypertensive chronic kidney disease with stage 1 through stage 4 chronic kidney disease, or unspecified chronic kidney disease: Secondary | ICD-10-CM | POA: Diagnosis not present

## 2019-01-14 DIAGNOSIS — E1140 Type 2 diabetes mellitus with diabetic neuropathy, unspecified: Secondary | ICD-10-CM | POA: Diagnosis not present

## 2019-01-14 DIAGNOSIS — I429 Cardiomyopathy, unspecified: Secondary | ICD-10-CM | POA: Diagnosis not present

## 2019-01-14 DIAGNOSIS — M25562 Pain in left knee: Secondary | ICD-10-CM | POA: Diagnosis not present

## 2019-01-14 DIAGNOSIS — I2510 Atherosclerotic heart disease of native coronary artery without angina pectoris: Secondary | ICD-10-CM | POA: Diagnosis not present

## 2019-01-14 DIAGNOSIS — G8929 Other chronic pain: Secondary | ICD-10-CM | POA: Diagnosis not present

## 2019-01-14 DIAGNOSIS — E1151 Type 2 diabetes mellitus with diabetic peripheral angiopathy without gangrene: Secondary | ICD-10-CM | POA: Diagnosis not present

## 2019-01-20 DIAGNOSIS — M545 Low back pain: Secondary | ICD-10-CM | POA: Diagnosis not present

## 2019-01-20 DIAGNOSIS — I480 Paroxysmal atrial fibrillation: Secondary | ICD-10-CM | POA: Diagnosis not present

## 2019-01-20 DIAGNOSIS — E785 Hyperlipidemia, unspecified: Secondary | ICD-10-CM | POA: Diagnosis not present

## 2019-01-20 DIAGNOSIS — I429 Cardiomyopathy, unspecified: Secondary | ICD-10-CM | POA: Diagnosis not present

## 2019-01-20 DIAGNOSIS — I129 Hypertensive chronic kidney disease with stage 1 through stage 4 chronic kidney disease, or unspecified chronic kidney disease: Secondary | ICD-10-CM | POA: Diagnosis not present

## 2019-01-20 DIAGNOSIS — E1122 Type 2 diabetes mellitus with diabetic chronic kidney disease: Secondary | ICD-10-CM | POA: Diagnosis not present

## 2019-01-20 DIAGNOSIS — M25561 Pain in right knee: Secondary | ICD-10-CM | POA: Diagnosis not present

## 2019-01-20 DIAGNOSIS — E1140 Type 2 diabetes mellitus with diabetic neuropathy, unspecified: Secondary | ICD-10-CM | POA: Diagnosis not present

## 2019-01-20 DIAGNOSIS — E1151 Type 2 diabetes mellitus with diabetic peripheral angiopathy without gangrene: Secondary | ICD-10-CM | POA: Diagnosis not present

## 2019-01-20 DIAGNOSIS — I2510 Atherosclerotic heart disease of native coronary artery without angina pectoris: Secondary | ICD-10-CM | POA: Diagnosis not present

## 2019-01-20 DIAGNOSIS — N189 Chronic kidney disease, unspecified: Secondary | ICD-10-CM | POA: Diagnosis not present

## 2019-01-20 DIAGNOSIS — G8929 Other chronic pain: Secondary | ICD-10-CM | POA: Diagnosis not present

## 2019-01-20 DIAGNOSIS — M25562 Pain in left knee: Secondary | ICD-10-CM | POA: Diagnosis not present

## 2019-01-26 ENCOUNTER — Other Ambulatory Visit: Payer: Self-pay | Admitting: *Deleted

## 2019-01-27 MED ORDER — ATORVASTATIN CALCIUM 40 MG PO TABS: 40.0000 mg | ORAL_TABLET | Freq: Every day | ORAL | 1 refills | Status: DC

## 2019-01-28 DIAGNOSIS — I2510 Atherosclerotic heart disease of native coronary artery without angina pectoris: Secondary | ICD-10-CM | POA: Diagnosis not present

## 2019-01-28 DIAGNOSIS — M25562 Pain in left knee: Secondary | ICD-10-CM | POA: Diagnosis not present

## 2019-01-28 DIAGNOSIS — E1151 Type 2 diabetes mellitus with diabetic peripheral angiopathy without gangrene: Secondary | ICD-10-CM | POA: Diagnosis not present

## 2019-01-28 DIAGNOSIS — M545 Low back pain: Secondary | ICD-10-CM | POA: Diagnosis not present

## 2019-01-28 DIAGNOSIS — I480 Paroxysmal atrial fibrillation: Secondary | ICD-10-CM | POA: Diagnosis not present

## 2019-01-28 DIAGNOSIS — I129 Hypertensive chronic kidney disease with stage 1 through stage 4 chronic kidney disease, or unspecified chronic kidney disease: Secondary | ICD-10-CM | POA: Diagnosis not present

## 2019-01-28 DIAGNOSIS — E1122 Type 2 diabetes mellitus with diabetic chronic kidney disease: Secondary | ICD-10-CM | POA: Diagnosis not present

## 2019-01-28 DIAGNOSIS — E785 Hyperlipidemia, unspecified: Secondary | ICD-10-CM | POA: Diagnosis not present

## 2019-01-28 DIAGNOSIS — I429 Cardiomyopathy, unspecified: Secondary | ICD-10-CM | POA: Diagnosis not present

## 2019-01-28 DIAGNOSIS — M25561 Pain in right knee: Secondary | ICD-10-CM | POA: Diagnosis not present

## 2019-01-28 DIAGNOSIS — E1140 Type 2 diabetes mellitus with diabetic neuropathy, unspecified: Secondary | ICD-10-CM | POA: Diagnosis not present

## 2019-01-28 DIAGNOSIS — N189 Chronic kidney disease, unspecified: Secondary | ICD-10-CM | POA: Diagnosis not present

## 2019-01-28 DIAGNOSIS — G8929 Other chronic pain: Secondary | ICD-10-CM | POA: Diagnosis not present

## 2019-02-03 DIAGNOSIS — E1140 Type 2 diabetes mellitus with diabetic neuropathy, unspecified: Secondary | ICD-10-CM | POA: Diagnosis not present

## 2019-02-03 DIAGNOSIS — I2510 Atherosclerotic heart disease of native coronary artery without angina pectoris: Secondary | ICD-10-CM | POA: Diagnosis not present

## 2019-02-03 DIAGNOSIS — G8929 Other chronic pain: Secondary | ICD-10-CM | POA: Diagnosis not present

## 2019-02-03 DIAGNOSIS — M25562 Pain in left knee: Secondary | ICD-10-CM | POA: Diagnosis not present

## 2019-02-03 DIAGNOSIS — N189 Chronic kidney disease, unspecified: Secondary | ICD-10-CM | POA: Diagnosis not present

## 2019-02-03 DIAGNOSIS — E1122 Type 2 diabetes mellitus with diabetic chronic kidney disease: Secondary | ICD-10-CM | POA: Diagnosis not present

## 2019-02-03 DIAGNOSIS — I429 Cardiomyopathy, unspecified: Secondary | ICD-10-CM | POA: Diagnosis not present

## 2019-02-03 DIAGNOSIS — E785 Hyperlipidemia, unspecified: Secondary | ICD-10-CM | POA: Diagnosis not present

## 2019-02-03 DIAGNOSIS — M545 Low back pain: Secondary | ICD-10-CM | POA: Diagnosis not present

## 2019-02-03 DIAGNOSIS — I480 Paroxysmal atrial fibrillation: Secondary | ICD-10-CM | POA: Diagnosis not present

## 2019-02-03 DIAGNOSIS — M25561 Pain in right knee: Secondary | ICD-10-CM | POA: Diagnosis not present

## 2019-02-03 DIAGNOSIS — I129 Hypertensive chronic kidney disease with stage 1 through stage 4 chronic kidney disease, or unspecified chronic kidney disease: Secondary | ICD-10-CM | POA: Diagnosis not present

## 2019-02-03 DIAGNOSIS — E1151 Type 2 diabetes mellitus with diabetic peripheral angiopathy without gangrene: Secondary | ICD-10-CM | POA: Diagnosis not present

## 2019-02-04 ENCOUNTER — Other Ambulatory Visit: Payer: Self-pay | Admitting: *Deleted

## 2019-02-04 MED ORDER — TAMSULOSIN HCL 0.4 MG PO CAPS: ORAL_CAPSULE | ORAL | 3 refills | Status: DC

## 2019-02-15 DIAGNOSIS — H40013 Open angle with borderline findings, low risk, bilateral: Secondary | ICD-10-CM | POA: Diagnosis not present

## 2019-02-15 DIAGNOSIS — H04123 Dry eye syndrome of bilateral lacrimal glands: Secondary | ICD-10-CM | POA: Diagnosis not present

## 2019-02-22 ENCOUNTER — Other Ambulatory Visit: Payer: Self-pay | Admitting: *Deleted

## 2019-02-23 MED ORDER — FINASTERIDE 5 MG PO TABS: 5.0000 mg | ORAL_TABLET | Freq: Every day | ORAL | 2 refills | Status: DC

## 2019-03-12 ENCOUNTER — Encounter: Payer: Self-pay | Admitting: Podiatry

## 2019-03-12 ENCOUNTER — Ambulatory Visit (INDEPENDENT_AMBULATORY_CARE_PROVIDER_SITE_OTHER): Payer: Medicare Other | Admitting: Podiatry

## 2019-03-12 ENCOUNTER — Other Ambulatory Visit: Payer: Self-pay

## 2019-03-12 DIAGNOSIS — M79675 Pain in left toe(s): Secondary | ICD-10-CM

## 2019-03-12 DIAGNOSIS — B351 Tinea unguium: Secondary | ICD-10-CM

## 2019-03-12 DIAGNOSIS — E119 Type 2 diabetes mellitus without complications: Secondary | ICD-10-CM | POA: Diagnosis not present

## 2019-03-12 DIAGNOSIS — M79674 Pain in right toe(s): Secondary | ICD-10-CM

## 2019-03-12 NOTE — Patient Instructions (Signed)
Diabetes Mellitus and Foot Care Foot care is an important part of your health, especially when you have diabetes. Diabetes may cause you to have problems because of poor blood flow (circulation) to your feet and legs, which can cause your skin to:  Become thinner and drier.  Break more easily.  Heal more slowly.  Peel and crack. You may also have nerve damage (neuropathy) in your legs and feet, causing decreased feeling in them. This means that you may not notice minor injuries to your feet that could lead to more serious problems. Noticing and addressing any potential problems early is the best way to prevent future foot problems. How to care for your feet Foot hygiene  Wash your feet daily with warm water and mild soap. Do not use hot water. Then, pat your feet and the areas between your toes until they are completely dry. Do not soak your feet as this can dry your skin.  Trim your toenails straight across. Do not dig under them or around the cuticle. File the edges of your nails with an emery board or nail file.  Apply a moisturizing lotion or petroleum jelly to the skin on your feet and to dry, brittle toenails. Use lotion that does not contain alcohol and is unscented. Do not apply lotion between your toes. Shoes and socks  Wear clean socks or stockings every day. Make sure they are not too tight. Do not wear knee-high stockings since they may decrease blood flow to your legs.  Wear shoes that fit properly and have enough cushioning. Always look in your shoes before you put them on to be sure there are no objects inside.  To break in new shoes, wear them for just a few hours a day. This prevents injuries on your feet. Wounds, scrapes, corns, and calluses  Check your feet daily for blisters, cuts, bruises, sores, and redness. If you cannot see the bottom of your feet, use a mirror or ask someone for help.  Do not cut corns or calluses or try to remove them with medicine.  If you  find a minor scrape, cut, or break in the skin on your feet, keep it and the skin around it clean and dry. You may clean these areas with mild soap and water. Do not clean the area with peroxide, alcohol, or iodine.  If you have a wound, scrape, corn, or callus on your foot, look at it several times a day to make sure it is healing and not infected. Check for: ? Redness, swelling, or pain. ? Fluid or blood. ? Warmth. ? Pus or a bad smell. General instructions  Do not cross your legs. This may decrease blood flow to your feet.  Do not use heating pads or hot water bottles on your feet. They may burn your skin. If you have lost feeling in your feet or legs, you may not know this is happening until it is too late.  Protect your feet from hot and cold by wearing shoes, such as at the beach or on hot pavement.  Schedule a complete foot exam at least once a year (annually) or more often if you have foot problems. If you have foot problems, report any cuts, sores, or bruises to your health care provider immediately. Contact a health care provider if:  You have a medical condition that increases your risk of infection and you have any cuts, sores, or bruises on your feet.  You have an injury that is not   healing.  You have redness on your legs or feet.  You feel burning or tingling in your legs or feet.  You have pain or cramps in your legs and feet.  Your legs or feet are numb.  Your feet always feel cold.  You have pain around a toenail. Get help right away if:  You have a wound, scrape, corn, or callus on your foot and: ? You have pain, swelling, or redness that gets worse. ? You have fluid or blood coming from the wound, scrape, corn, or callus. ? Your wound, scrape, corn, or callus feels warm to the touch. ? You have pus or a bad smell coming from the wound, scrape, corn, or callus. ? You have a fever. ? You have a red line going up your leg. Summary  Check your feet every day  for cuts, sores, red spots, swelling, and blisters.  Moisturize feet and legs daily.  Wear shoes that fit properly and have enough cushioning.  If you have foot problems, report any cuts, sores, or bruises to your health care provider immediately.  Schedule a complete foot exam at least once a year (annually) or more often if you have foot problems. This information is not intended to replace advice given to you by your health care provider. Make sure you discuss any questions you have with your health care provider. Document Revised: 10/07/2018 Document Reviewed: 02/16/2016 Elsevier Patient Education  2020 [employer]  

## 2019-03-14 NOTE — Progress Notes (Signed)
Subjective: Brian Burns presents today for follow up of preventative diabetic foot care and painful mycotic nails b/l that are difficult to trim. Pain interferes with ambulation. Aggravating factors include wearing enclosed shoe gear. Pain is relieved with periodic professional debridement.   Allergies  Allergen Reactions  . Gabapentin Other (See Comments)    Sedation at 300mg  TID     Objective: There were no vitals filed for this visit.  Vascular Examination:  Capillary refill time to digits immediate b/l, palpable DP pulses b/l, palpable PT pulses b/l, pedal hair absent b/l and skin temperature gradient within normal limits b/l  Dermatological Examination: Pedal skin with normal turgor, texture and tone bilaterally, no open wounds bilaterally, no interdigital macerations bilaterally and toenails 1-5 b/l elongated, dystrophic, thickened, crumbly with subungual debris.  Resolved hyperkeratoses b/l hallux.   Musculoskeletal: Normal muscle strength 5/5 to all lower extremity muscle groups bilaterally, no gross bony deformities bilaterally and no pain crepitus or joint limitation noted with ROM b/l  Neurological: Protective sensation intact 5/5 intact bilaterally with 10g monofilament b/l and vibratory sensation intact b/l  Assessment: 1. Pain due to onychomycosis of toenails of both feet   2. Controlled type 2 diabetes mellitus without complication, without long-term current use of insulin (HCC)    Plan: -Continue diabetic foot care principles. Literature dispensed on today.  -Toenails 1-5 b/l were debrided in length and girth without iatrogenic bleeding. -Patient to continue soft, supportive shoe gear daily. -Patient to report any pedal injuries to medical professional immediately. -Patient/POA to call should there be question/concern in the interim.  Return in about 3 months (around 06/09/2019) for diabetic nail and callus trim/ Xarelto.

## 2019-04-01 DIAGNOSIS — R531 Weakness: Secondary | ICD-10-CM | POA: Diagnosis not present

## 2019-04-01 DIAGNOSIS — W19XXXA Unspecified fall, initial encounter: Secondary | ICD-10-CM | POA: Diagnosis not present

## 2019-04-19 DIAGNOSIS — N1831 Chronic kidney disease, stage 3a: Secondary | ICD-10-CM | POA: Diagnosis not present

## 2019-04-19 DIAGNOSIS — N189 Chronic kidney disease, unspecified: Secondary | ICD-10-CM | POA: Diagnosis not present

## 2019-04-19 DIAGNOSIS — D631 Anemia in chronic kidney disease: Secondary | ICD-10-CM | POA: Diagnosis not present

## 2019-04-19 DIAGNOSIS — I129 Hypertensive chronic kidney disease with stage 1 through stage 4 chronic kidney disease, or unspecified chronic kidney disease: Secondary | ICD-10-CM | POA: Diagnosis not present

## 2019-04-19 DIAGNOSIS — E1122 Type 2 diabetes mellitus with diabetic chronic kidney disease: Secondary | ICD-10-CM | POA: Diagnosis not present

## 2019-04-21 DIAGNOSIS — N1831 Chronic kidney disease, stage 3a: Secondary | ICD-10-CM | POA: Diagnosis not present

## 2019-04-22 ENCOUNTER — Other Ambulatory Visit: Payer: Self-pay | Admitting: Nephrology

## 2019-04-22 DIAGNOSIS — N1831 Chronic kidney disease, stage 3a: Secondary | ICD-10-CM

## 2019-04-26 ENCOUNTER — Ambulatory Visit
Admission: RE | Admit: 2019-04-26 | Discharge: 2019-04-26 | Disposition: A | Discharge status: Home / Self Care | Payer: Medicare Other | Source: Ambulatory Visit | Attending: Nephrology | Admitting: Nephrology

## 2019-04-26 DIAGNOSIS — N1831 Chronic kidney disease, stage 3a: Secondary | ICD-10-CM

## 2019-04-26 DIAGNOSIS — N183 Chronic kidney disease, stage 3 unspecified: Secondary | ICD-10-CM | POA: Diagnosis not present

## 2019-05-12 ENCOUNTER — Other Ambulatory Visit: Payer: Self-pay | Admitting: *Deleted

## 2019-05-16 MED ORDER — AMLODIPINE BESYLATE 10 MG PO TABS: 10.0000 mg | ORAL_TABLET | Freq: Every day | ORAL | 3 refills | Status: DC

## 2019-05-16 NOTE — Telephone Encounter (Signed)
Will refill. Please have patient schedule a 6 month check up visit at earliest convenience. Last visit in November 2020.

## 2019-05-18 DIAGNOSIS — I129 Hypertensive chronic kidney disease with stage 1 through stage 4 chronic kidney disease, or unspecified chronic kidney disease: Secondary | ICD-10-CM | POA: Diagnosis not present

## 2019-05-18 DIAGNOSIS — N1831 Chronic kidney disease, stage 3a: Secondary | ICD-10-CM | POA: Diagnosis not present

## 2019-05-21 ENCOUNTER — Other Ambulatory Visit: Payer: Self-pay

## 2019-05-21 ENCOUNTER — Ambulatory Visit (INDEPENDENT_AMBULATORY_CARE_PROVIDER_SITE_OTHER): Payer: Medicare Other | Admitting: Family Medicine

## 2019-05-21 ENCOUNTER — Encounter: Payer: Self-pay | Admitting: Family Medicine

## 2019-05-21 VITALS — BP 94/62 | HR 58 | Wt 216.2 lb

## 2019-05-21 DIAGNOSIS — I152 Hypertension secondary to endocrine disorders: Secondary | ICD-10-CM

## 2019-05-21 DIAGNOSIS — E1159 Type 2 diabetes mellitus with other circulatory complications: Secondary | ICD-10-CM

## 2019-05-21 DIAGNOSIS — R42 Dizziness and giddiness: Secondary | ICD-10-CM

## 2019-05-21 DIAGNOSIS — I10 Essential (primary) hypertension: Secondary | ICD-10-CM

## 2019-05-21 NOTE — Progress Notes (Signed)
    SUBJECTIVE:   CHIEF COMPLAINT / HPI:   Complaining of dizziness primarily when he first wakes up in the morning and stands up. He apparently has been told that he should work on trying to stand up more slowly which he says actually does work for resolving his symptoms but he often forgets to do so. Of note is currently on lisinopril 20 with systolic blood pressures potentially overcontrolled to under 100 systolic  PERTINENT  PMH / PSH:   OBJECTIVE:   BP 94/62   Pulse (!) 58   Wt 216 lb 3.2 oz (98.1 kg)   SpO2 97%   BMI 26.32 kg/m   General: Pleasant, alert, does have some memory issues. Daughter is primary caregiver Clear to auscultation bilaterally at lungs, regular rate and rhythm for cardiac Orthostatics negative and consistently mid 90s over mid 60s  ASSESSMENT/PLAN:   Orthostatic dizziness Does not have orthostatic hypotension on either our test today or on recent results shown to me by patient's daughter from recent nephrology visit.  Still given story the dizziness only happens when he stands up off I believe this is orthostatic dizziness. It could be that his systolic blood pressures under 100 consistently are contributing to this. Currently on lisinopril 20 by the nephrologist, will suggest reducing this to 10 mg and checking back in in a week or 2 to see how this has affected his symptoms. To avoid having to waste pills daughter intends to split the lisinopril at this time.  Hypertension associated with diabetes (HCC) Potentially overcontrolled with systolic blood pressure under 500, is complaining of orthostatic dizziness  We'll reduce lisinopril to 10 from 20.     Marthenia Rolling, DO [facility]

## 2019-05-22 NOTE — Assessment & Plan Note (Signed)
Potentially overcontrolled with systolic blood pressure under 511, is complaining of orthostatic dizziness  We'll reduce lisinopril to 10 from 20.

## 2019-05-22 NOTE — Assessment & Plan Note (Signed)
Does not have orthostatic hypotension on either our test today or on recent results shown to me by patient's daughter from recent nephrology visit.  Still given story the dizziness only happens when he stands up off I believe this is orthostatic dizziness. It could be that his systolic blood pressures under 100 consistently are contributing to this. Currently on lisinopril 20 by the nephrologist, will suggest reducing this to 10 mg and checking back in in a week or 2 to see how this has affected his symptoms. To avoid having to waste pills daughter intends to split the lisinopril at this time.

## 2019-05-24 DIAGNOSIS — G9340 Encephalopathy, unspecified: Secondary | ICD-10-CM | POA: Diagnosis not present

## 2019-05-24 DIAGNOSIS — R0602 Shortness of breath: Secondary | ICD-10-CM | POA: Diagnosis not present

## 2019-05-24 DIAGNOSIS — N3001 Acute cystitis with hematuria: Secondary | ICD-10-CM | POA: Diagnosis not present

## 2019-05-24 DIAGNOSIS — E860 Dehydration: Secondary | ICD-10-CM | POA: Diagnosis not present

## 2019-05-24 DIAGNOSIS — N179 Acute kidney failure, unspecified: Secondary | ICD-10-CM | POA: Diagnosis not present

## 2019-05-25 ENCOUNTER — Other Ambulatory Visit: Payer: Self-pay

## 2019-05-25 ENCOUNTER — Emergency Department (HOSPITAL_COMMUNITY): Payer: Medicare Other

## 2019-05-25 ENCOUNTER — Encounter (HOSPITAL_COMMUNITY): Payer: Self-pay

## 2019-05-25 ENCOUNTER — Inpatient Hospital Stay (HOSPITAL_COMMUNITY)
Admission: EM | Admit: 2019-05-25 | Discharge: 2019-06-01 | DRG: 871 | Disposition: A | Discharge status: Skilled Nursing Facility | Payer: Medicare Other | Attending: Family Medicine | Admitting: Family Medicine

## 2019-05-25 DIAGNOSIS — E1169 Type 2 diabetes mellitus with other specified complication: Secondary | ICD-10-CM | POA: Diagnosis not present

## 2019-05-25 DIAGNOSIS — I152 Hypertension secondary to endocrine disorders: Secondary | ICD-10-CM | POA: Diagnosis not present

## 2019-05-25 DIAGNOSIS — I4892 Unspecified atrial flutter: Secondary | ICD-10-CM | POA: Diagnosis not present

## 2019-05-25 DIAGNOSIS — R159 Full incontinence of feces: Secondary | ICD-10-CM | POA: Diagnosis present

## 2019-05-25 DIAGNOSIS — H9193 Unspecified hearing loss, bilateral: Secondary | ICD-10-CM | POA: Diagnosis present

## 2019-05-25 DIAGNOSIS — Z6826 Body mass index (BMI) 26.0-26.9, adult: Secondary | ICD-10-CM

## 2019-05-25 DIAGNOSIS — R569 Unspecified convulsions: Secondary | ICD-10-CM

## 2019-05-25 DIAGNOSIS — Z87440 Personal history of urinary (tract) infections: Secondary | ICD-10-CM

## 2019-05-25 DIAGNOSIS — N183 Chronic kidney disease, stage 3 unspecified: Secondary | ICD-10-CM | POA: Diagnosis present

## 2019-05-25 DIAGNOSIS — F0150 Vascular dementia without behavioral disturbance: Secondary | ICD-10-CM | POA: Diagnosis present

## 2019-05-25 DIAGNOSIS — R410 Disorientation, unspecified: Secondary | ICD-10-CM | POA: Diagnosis not present

## 2019-05-25 DIAGNOSIS — R6520 Severe sepsis without septic shock: Secondary | ICD-10-CM | POA: Diagnosis not present

## 2019-05-25 DIAGNOSIS — Z841 Family history of disorders of kidney and ureter: Secondary | ICD-10-CM

## 2019-05-25 DIAGNOSIS — E1140 Type 2 diabetes mellitus with diabetic neuropathy, unspecified: Secondary | ICD-10-CM | POA: Diagnosis not present

## 2019-05-25 DIAGNOSIS — N179 Acute kidney failure, unspecified: Secondary | ICD-10-CM | POA: Diagnosis not present

## 2019-05-25 DIAGNOSIS — R338 Other retention of urine: Secondary | ICD-10-CM | POA: Diagnosis not present

## 2019-05-25 DIAGNOSIS — E1122 Type 2 diabetes mellitus with diabetic chronic kidney disease: Secondary | ICD-10-CM | POA: Diagnosis not present

## 2019-05-25 DIAGNOSIS — E785 Hyperlipidemia, unspecified: Secondary | ICD-10-CM | POA: Diagnosis present

## 2019-05-25 DIAGNOSIS — D649 Anemia, unspecified: Secondary | ICD-10-CM | POA: Diagnosis not present

## 2019-05-25 DIAGNOSIS — E1151 Type 2 diabetes mellitus with diabetic peripheral angiopathy without gangrene: Secondary | ICD-10-CM | POA: Diagnosis present

## 2019-05-25 DIAGNOSIS — G9341 Metabolic encephalopathy: Secondary | ICD-10-CM | POA: Diagnosis present

## 2019-05-25 DIAGNOSIS — N3001 Acute cystitis with hematuria: Secondary | ICD-10-CM | POA: Diagnosis not present

## 2019-05-25 DIAGNOSIS — Z87891 Personal history of nicotine dependence: Secondary | ICD-10-CM

## 2019-05-25 DIAGNOSIS — A419 Sepsis, unspecified organism: Secondary | ICD-10-CM | POA: Diagnosis not present

## 2019-05-25 DIAGNOSIS — R404 Transient alteration of awareness: Secondary | ICD-10-CM | POA: Diagnosis not present

## 2019-05-25 DIAGNOSIS — Z8673 Personal history of transient ischemic attack (TIA), and cerebral infarction without residual deficits: Secondary | ICD-10-CM

## 2019-05-25 DIAGNOSIS — Z79899 Other long term (current) drug therapy: Secondary | ICD-10-CM

## 2019-05-25 DIAGNOSIS — I428 Other cardiomyopathies: Secondary | ICD-10-CM | POA: Diagnosis not present

## 2019-05-25 DIAGNOSIS — Z7901 Long term (current) use of anticoagulants: Secondary | ICD-10-CM

## 2019-05-25 DIAGNOSIS — E860 Dehydration: Secondary | ICD-10-CM | POA: Diagnosis not present

## 2019-05-25 DIAGNOSIS — N35919 Unspecified urethral stricture, male, unspecified site: Secondary | ICD-10-CM | POA: Diagnosis not present

## 2019-05-25 DIAGNOSIS — I480 Paroxysmal atrial fibrillation: Secondary | ICD-10-CM | POA: Diagnosis not present

## 2019-05-25 DIAGNOSIS — K219 Gastro-esophageal reflux disease without esophagitis: Secondary | ICD-10-CM | POA: Diagnosis present

## 2019-05-25 DIAGNOSIS — R509 Fever, unspecified: Secondary | ICD-10-CM | POA: Diagnosis not present

## 2019-05-25 DIAGNOSIS — I2510 Atherosclerotic heart disease of native coronary artery without angina pectoris: Secondary | ICD-10-CM | POA: Diagnosis present

## 2019-05-25 DIAGNOSIS — R4020 Unspecified coma: Secondary | ICD-10-CM | POA: Diagnosis not present

## 2019-05-25 DIAGNOSIS — F05 Delirium due to known physiological condition: Secondary | ICD-10-CM | POA: Diagnosis not present

## 2019-05-25 DIAGNOSIS — Z20822 Contact with and (suspected) exposure to covid-19: Secondary | ICD-10-CM | POA: Diagnosis present

## 2019-05-25 DIAGNOSIS — Z8249 Family history of ischemic heart disease and other diseases of the circulatory system: Secondary | ICD-10-CM

## 2019-05-25 DIAGNOSIS — G9340 Encephalopathy, unspecified: Secondary | ICD-10-CM

## 2019-05-25 DIAGNOSIS — Z888 Allergy status to other drugs, medicaments and biological substances status: Secondary | ICD-10-CM

## 2019-05-25 DIAGNOSIS — N401 Enlarged prostate with lower urinary tract symptoms: Secondary | ICD-10-CM | POA: Diagnosis present

## 2019-05-25 DIAGNOSIS — R0602 Shortness of breath: Secondary | ICD-10-CM | POA: Diagnosis not present

## 2019-05-25 DIAGNOSIS — R627 Adult failure to thrive: Secondary | ICD-10-CM | POA: Diagnosis present

## 2019-05-25 DIAGNOSIS — F329 Major depressive disorder, single episode, unspecified: Secondary | ICD-10-CM | POA: Diagnosis present

## 2019-05-25 LAB — BASIC METABOLIC PANEL
Anion gap: 12 (ref 5–15)
Anion gap: 10 (ref 5–15)
BUN: 19 mg/dL (ref 8–23)
BUN: 18 mg/dL (ref 8–23)
CO2: 19 mmol/L — ABNORMAL LOW (ref 22–32)
CO2: 23 mmol/L (ref 22–32)
Calcium: 8.1 mg/dL — ABNORMAL LOW (ref 8.9–10.3)
Calcium: 8.5 mg/dL — ABNORMAL LOW (ref 8.9–10.3)
Chloride: 104 mmol/L (ref 98–111)
Chloride: 103 mmol/L (ref 98–111)
Creatinine, Ser: 1.97 mg/dL — ABNORMAL HIGH (ref 0.61–1.24)
Creatinine, Ser: 1.89 mg/dL — ABNORMAL HIGH (ref 0.61–1.24)
GFR calc Af Amer: 38 mL/min — ABNORMAL LOW (ref >60)
GFR calc Af Amer: 40 mL/min — ABNORMAL LOW (ref >60)
GFR calc non Af Amer: 33 mL/min — ABNORMAL LOW (ref >60)
GFR calc non Af Amer: 35 mL/min — ABNORMAL LOW (ref >60)
Glucose, Bld: 139 mg/dL — ABNORMAL HIGH (ref 70–99)
Glucose, Bld: 125 mg/dL — ABNORMAL HIGH (ref 70–99)
Potassium: 3.5 mmol/L (ref 3.5–5.1)
Potassium: 3.7 mmol/L (ref 3.5–5.1)
Sodium: 135 mmol/L (ref 135–145)
Sodium: 136 mmol/L (ref 135–145)

## 2019-05-25 LAB — I-STAT CHEM 8, ED
BUN: 19 mg/dL (ref 8–23)
Calcium, Ion: 1.09 mmol/L — ABNORMAL LOW (ref 1.15–1.40)
Chloride: 103 mmol/L (ref 98–111)
Creatinine, Ser: 1.9 mg/dL — ABNORMAL HIGH (ref 0.61–1.24)
Glucose, Bld: 174 mg/dL — ABNORMAL HIGH (ref 70–99)
HCT: 37 % — ABNORMAL LOW (ref 39.0–52.0)
Hemoglobin: 12.6 g/dL — ABNORMAL LOW (ref 13.0–17.0)
Potassium: 3.7 mmol/L (ref 3.5–5.1)
Sodium: 136 mmol/L (ref 135–145)
TCO2: 11 mmol/L — ABNORMAL LOW (ref 22–32)

## 2019-05-25 LAB — DIFFERENTIAL
Abs Immature Granulocytes: 0.01 10*3/uL (ref 0.00–0.07)
Basophils Absolute: 0 10*3/uL (ref 0.0–0.1)
Basophils Relative: 0 %
Eosinophils Absolute: 0 10*3/uL (ref 0.0–0.5)
Eosinophils Relative: 1 %
Immature Granulocytes: 0 %
Lymphocytes Relative: 25 %
Lymphs Abs: 1.8 10*3/uL (ref 0.7–4.0)
Monocytes Absolute: 0.1 10*3/uL (ref 0.1–1.0)
Monocytes Relative: 1 %
Neutro Abs: 5.1 10*3/uL (ref 1.7–7.7)
Neutrophils Relative %: 73 %

## 2019-05-25 LAB — PROTIME-INR
INR: 1.4 — ABNORMAL HIGH (ref 0.8–1.2)
Prothrombin Time: 16.9 seconds — ABNORMAL HIGH (ref 11.4–15.2)

## 2019-05-25 LAB — SALICYLATE LEVEL: Salicylate Lvl: <7 mg/dL — ABNORMAL LOW (ref 7.0–30.0)

## 2019-05-25 LAB — LACTIC ACID, PLASMA
Lactic Acid, Venous: >11 mmol/L (ref 0.5–1.9)
Lactic Acid, Venous: 5.1 mmol/L (ref 0.5–1.9)
Lactic Acid, Venous: 3.7 mmol/L (ref 0.5–1.9)
Lactic Acid, Venous: 4.1 mmol/L (ref 0.5–1.9)

## 2019-05-25 LAB — URINALYSIS, ROUTINE W REFLEX MICROSCOPIC
Bilirubin Urine: NEGATIVE
Glucose, UA: NEGATIVE mg/dL
Ketones, ur: NEGATIVE mg/dL
Nitrite: NEGATIVE
Protein, ur: 100 mg/dL — AB
RBC / HPF: >50 RBC/hpf — ABNORMAL HIGH (ref 0–5)
Specific Gravity, Urine: 1.014 (ref 1.005–1.030)
pH: 7 (ref 5.0–8.0)

## 2019-05-25 LAB — RAPID URINE DRUG SCREEN, HOSP PERFORMED
Amphetamines: NOT DETECTED
Barbiturates: NOT DETECTED
Benzodiazepines: NOT DETECTED
Cocaine: NOT DETECTED
Opiates: NOT DETECTED
Tetrahydrocannabinol: NOT DETECTED

## 2019-05-25 LAB — CBC
HCT: 37.6 % — ABNORMAL LOW (ref 39.0–52.0)
Hemoglobin: 11.5 g/dL — ABNORMAL LOW (ref 13.0–17.0)
MCH: 28.4 pg (ref 26.0–34.0)
MCHC: 30.6 g/dL (ref 30.0–36.0)
MCV: 92.8 fL (ref 80.0–100.0)
Platelets: 128 10*3/uL — ABNORMAL LOW (ref 150–400)
RBC: 4.05 MIL/uL — ABNORMAL LOW (ref 4.22–5.81)
RDW: 16.9 % — ABNORMAL HIGH (ref 11.5–15.5)
WBC: 7 10*3/uL (ref 4.0–10.5)
nRBC: 0 % (ref 0.0–0.2)

## 2019-05-25 LAB — LIPASE, BLOOD: Lipase: 51 U/L (ref 11–51)

## 2019-05-25 LAB — LIPID PANEL
Cholesterol: 131 mg/dL (ref 0–200)
HDL: 36 mg/dL — ABNORMAL LOW (ref >40)
LDL Cholesterol: 79 mg/dL (ref 0–99)
Total CHOL/HDL Ratio: 3.6 RATIO
Triglycerides: 80 mg/dL (ref <150)
VLDL: 16 mg/dL (ref 0–40)

## 2019-05-25 LAB — COMPREHENSIVE METABOLIC PANEL
ALT: 20 U/L (ref 0–44)
AST: 28 U/L (ref 15–41)
Albumin: 3.5 g/dL (ref 3.5–5.0)
Alkaline Phosphatase: 109 U/L (ref 38–126)
Anion gap: 21 — ABNORMAL HIGH (ref 5–15)
BUN: 19 mg/dL (ref 8–23)
CO2: 10 mmol/L — ABNORMAL LOW (ref 22–32)
Calcium: 8.7 mg/dL — ABNORMAL LOW (ref 8.9–10.3)
Chloride: 99 mmol/L (ref 98–111)
Creatinine, Ser: 2.15 mg/dL — ABNORMAL HIGH (ref 0.61–1.24)
GFR calc Af Amer: 34 mL/min — ABNORMAL LOW (ref >60)
GFR calc non Af Amer: 30 mL/min — ABNORMAL LOW (ref >60)
Glucose, Bld: 188 mg/dL — ABNORMAL HIGH (ref 70–99)
Potassium: 3.7 mmol/L (ref 3.5–5.1)
Sodium: 130 mmol/L — ABNORMAL LOW (ref 135–145)
Total Bilirubin: 0.6 mg/dL (ref 0.3–1.2)
Total Protein: 6.9 g/dL (ref 6.5–8.1)

## 2019-05-25 LAB — CBG MONITORING, ED
Glucose-Capillary: 165 mg/dL — ABNORMAL HIGH (ref 70–99)
Glucose-Capillary: 105 mg/dL — ABNORMAL HIGH (ref 70–99)
Glucose-Capillary: 90 mg/dL (ref 70–99)

## 2019-05-25 LAB — RESPIRATORY PANEL BY RT PCR (FLU A&B, COVID)
Influenza A by PCR: NEGATIVE
Influenza B by PCR: NEGATIVE
SARS Coronavirus 2 by RT PCR: NEGATIVE

## 2019-05-25 LAB — HEMOGLOBIN A1C
Hgb A1c MFr Bld: 6.7 % — ABNORMAL HIGH (ref 4.8–5.6)
Mean Plasma Glucose: 145.59 mg/dL

## 2019-05-25 LAB — URINE CULTURE: Culture: NO GROWTH

## 2019-05-25 LAB — AMMONIA: Ammonia: 192 umol/L — ABNORMAL HIGH (ref 9–35)

## 2019-05-25 LAB — PROCALCITONIN: Procalcitonin: 1.24 ng/mL

## 2019-05-25 LAB — CK: Total CK: 102 U/L (ref 49–397)

## 2019-05-25 LAB — ETHANOL: Alcohol, Ethyl (B): <10 mg/dL (ref <10)

## 2019-05-25 LAB — ACETAMINOPHEN LEVEL: Acetaminophen (Tylenol), Serum: <10 ug/mL — ABNORMAL LOW (ref 10–30)

## 2019-05-25 LAB — APTT: aPTT: 28 seconds (ref 24–36)

## 2019-05-25 MED ORDER — CITALOPRAM HYDROBROMIDE 20 MG PO TABS
20.0000 mg | ORAL_TABLET | Freq: Every day | ORAL | Status: DC
  Administered 2019-05-25: 20 mg via ORAL
  Filled 2019-05-25: qty 1

## 2019-05-25 MED ORDER — CITALOPRAM HYDROBROMIDE 10 MG PO TABS: 40.0000 mg | ORAL_TABLET | Freq: Every day | ORAL | Status: DC

## 2019-05-25 MED ORDER — SODIUM CHLORIDE 0.9 % IV SOLN: 2.0000 g | Freq: Two times a day (BID) | INTRAVENOUS | Status: DC

## 2019-05-25 MED ORDER — INSULIN ASPART 100 UNIT/ML ~~LOC~~ SOLN
0.0000 [IU] | Freq: Three times a day (TID) | SUBCUTANEOUS | Status: DC
  Administered 2019-05-31: 1 [IU] via SUBCUTANEOUS

## 2019-05-25 MED ORDER — FINASTERIDE 5 MG PO TABS
5.0000 mg | ORAL_TABLET | Freq: Every day | ORAL | Status: DC
  Administered 2019-05-25 – 2019-06-01 (×8): 5 mg via ORAL
  Filled 2019-05-25 (×9): qty 1

## 2019-05-25 MED ORDER — TAMSULOSIN HCL 0.4 MG PO CAPS
0.4000 mg | ORAL_CAPSULE | Freq: Every day | ORAL | Status: DC
  Administered 2019-05-25 – 2019-06-01 (×8): 0.4 mg via ORAL
  Filled 2019-05-25 (×8): qty 1

## 2019-05-25 MED ORDER — PANCRELIPASE (LIP-PROT-AMYL) 12000-38000 UNITS PO CPEP
12000.0000 [IU] | ORAL_CAPSULE | Freq: Two times a day (BID) | ORAL | Status: DC
  Administered 2019-05-25 – 2019-06-01 (×15): 12000 [IU] via ORAL
  Filled 2019-05-25 (×17): qty 1

## 2019-05-25 MED ORDER — METRONIDAZOLE IN NACL 5-0.79 MG/ML-% IV SOLN
500.0000 mg | Freq: Once | INTRAVENOUS | Status: AC
  Administered 2019-05-25: 500 mg via INTRAVENOUS
  Filled 2019-05-25: qty 100

## 2019-05-25 MED ORDER — FERROUS SULFATE 325 (65 FE) MG PO TABS: 325.0000 mg | ORAL_TABLET | ORAL | Status: DC

## 2019-05-25 MED ORDER — VANCOMYCIN HCL 1500 MG/300ML IV SOLN
1500.0000 mg | Freq: Once | INTRAVENOUS | Status: AC
  Administered 2019-05-25: 1500 mg via INTRAVENOUS
  Filled 2019-05-25: qty 300

## 2019-05-25 MED ORDER — ACETAMINOPHEN 325 MG PO TABS
650.0000 mg | ORAL_TABLET | Freq: Four times a day (QID) | ORAL | Status: DC | PRN
  Administered 2019-05-27 – 2019-06-01 (×2): 650 mg via ORAL
  Filled 2019-05-25 (×3): qty 2

## 2019-05-25 MED ORDER — SODIUM CHLORIDE 0.9 % IV SOLN
1.0000 g | INTRAVENOUS | Status: DC
  Administered 2019-05-25 – 2019-05-26 (×2): 1 g via INTRAVENOUS
  Filled 2019-05-25: qty 10
  Filled 2019-05-25: qty 1

## 2019-05-25 MED ORDER — ACETAMINOPHEN 650 MG RE SUPP
650.0000 mg | Freq: Once | RECTAL | Status: AC
  Administered 2019-05-25: 650 mg via RECTAL
  Filled 2019-05-25: qty 1

## 2019-05-25 MED ORDER — VANCOMYCIN HCL IN DEXTROSE 1-5 GM/200ML-% IV SOLN: 1000.0000 mg | Freq: Once | INTRAVENOUS | Status: DC

## 2019-05-25 MED ORDER — LACTATED RINGERS IV BOLUS (SEPSIS)
1000.0000 mL | Freq: Once | INTRAVENOUS | Status: AC
  Administered 2019-05-25: 1000 mL via INTRAVENOUS

## 2019-05-25 MED ORDER — ACETAMINOPHEN 650 MG RE SUPP: 650.0000 mg | Freq: Four times a day (QID) | RECTAL | Status: DC | PRN

## 2019-05-25 MED ORDER — SODIUM CHLORIDE 0.9 % IV BOLUS (SEPSIS)
1000.0000 mL | Freq: Once | INTRAVENOUS | Status: AC
  Administered 2019-05-25: 1000 mL via INTRAVENOUS

## 2019-05-25 MED ORDER — SODIUM CHLORIDE 0.9 % IV SOLN: INTRAVENOUS | Status: DC

## 2019-05-25 MED ORDER — VANCOMYCIN HCL 1250 MG/250ML IV SOLN: 1250.0000 mg | INTRAVENOUS | Status: DC

## 2019-05-25 MED ORDER — ONDANSETRON HCL 4 MG/2ML IJ SOLN
4.0000 mg | Freq: Once | INTRAMUSCULAR | Status: AC
  Administered 2019-05-25: 4 mg via INTRAVENOUS
  Filled 2019-05-25: qty 2

## 2019-05-25 MED ORDER — ATORVASTATIN CALCIUM 40 MG PO TABS
40.0000 mg | ORAL_TABLET | Freq: Every day | ORAL | Status: DC
  Administered 2019-05-25 – 2019-06-01 (×8): 40 mg via ORAL
  Filled 2019-05-25 (×8): qty 1

## 2019-05-25 MED ORDER — RIVAROXABAN 20 MG PO TABS
20.0000 mg | ORAL_TABLET | Freq: Every day | ORAL | Status: DC
  Administered 2019-05-25 – 2019-06-01 (×8): 20 mg via ORAL
  Filled 2019-05-25 (×8): qty 1

## 2019-05-25 MED ORDER — SODIUM CHLORIDE 0.9 % IV SOLN
2.0000 g | Freq: Once | INTRAVENOUS | Status: AC
  Administered 2019-05-25: 2 g via INTRAVENOUS
  Filled 2019-05-25: qty 2

## 2019-05-25 MED ORDER — ACETAMINOPHEN 325 MG PO TABS: 325.0000 mg | ORAL_TABLET | Freq: Four times a day (QID) | ORAL | Status: DC | PRN

## 2019-05-25 NOTE — Progress Notes (Signed)
Family Medicine Teaching Service Attending Note  I interviewed and examined patient Lakins and reviewed their tests and x-rays.  I discussed with Dr. Rockwell Alexandria and reviewed their note for today.  I agree with their assessment and plan.     Additionally  At 730 AM in ER is awake alert and oriented x 3 with significant dysarthria No new back pain (has chronic) or abdomen pain or nausea No tongue bites   No stiff neck No CVAT  Abdomen: soft and non-tender without masses, organomegaly or hernias noted.  No guarding or rebound   Fever Chills - given history of stricture and UA findings most consistent with sepsis due to UTI.  No signs of respiratory or skin infection or GI pathology.  Narrow antibiotics to cover urine pathogens   Seizure Activity - reported by daughter and EMS.  Sounds like has had tonic clonic seizure in past.   Normal head CT.  Likely had lowered seizure threshold with fever.   No evidence of new CVA or bleed or CNS infection.   Discuss with family if they wish further work up or possible treatment.    Urethra Strictures - check post void residual.  Likely need urology follow up   Observe returning to baseline - consult Physical Therapy

## 2019-05-25 NOTE — Evaluation (Signed)
Physical Therapy Evaluation Patient Details Name: Brian Burns MRN: [id number] DOB: March 30, 1945 Today's Date: 05/25/2019   History of Present Illness  Pt is a 74 y/o male admitted secondary to sepsis and seizure like activity. Thought to be secondary to UTI. PMH includes BPH, HTN, a fib, DM, and chronic pancreatitis.   Clinical Impression  Pt admitted secondary to problem above with deficits below. Pt requiring min to mod A to stand and take side steps at EOB this session. Pt unsteady and with increased back pain. Feel pt would benefit from SNF level therapies at d/c to increase independence and safety prior to return home. However, may progress well and be able to d/c home with [facility] services. Will continue to follow acutely to maximize functional mobility independence and safety.     Follow Up Recommendations SNF;Supervision/Assistance - 24 hour    Equipment Recommendations  Rolling walker with 5" wheels    Recommendations for Other Services       Precautions / Restrictions Precautions Precautions: Fall Restrictions Weight Bearing Restrictions: No      Mobility  Bed Mobility Overal bed mobility: Needs Assistance Bed Mobility: Supine to Sit;Sit to Supine     Supine to sit: Mod assist Sit to supine: Mod assist   General bed mobility comments: Mod A For trunk assist and LE assist to perform bed mobility tasks.   Transfers Overall transfer level: Needs assistance Equipment used: 1 person hand held assist Transfers: Sit to/from Stand Sit to Stand: Min assist         General transfer comment: Min A for lift assist and steadying to stand.   Ambulation/Gait Ambulation/Gait assistance: Mod assist   Assistive device: 1 person hand held assist       General Gait Details: Pt unsteady when taking steps at edge of stretcher. Reaching out for cabinet for support in room, despite cues to hold to PT hand.   Stairs            Wheelchair Mobility    Modified Rankin  (Stroke Patients Only)       Balance Overall balance assessment: Needs assistance Sitting-balance support: No upper extremity supported;Feet supported Sitting balance-Leahy Scale: Fair     Standing balance support: Single extremity supported;During functional activity Standing balance-Leahy Scale: Poor Standing balance comment: Reliant on at least 1 UE support                              Pertinent Vitals/Pain Pain Assessment: Faces Faces Pain Scale: Hurts little more Pain Location: back Pain Descriptors / Indicators: Aching Pain Intervention(s): Limited activity within patient's tolerance;Monitored during session;Repositioned    Home Living Family/patient expects to be discharged to:: Private residence Living Arrangements: Spouse/significant other Available Help at Discharge: Family;Available 24 hours/day Type of Home: House Home Access: Stairs to enter Entrance Stairs-Rails: Right;Left;Can reach both Entrance Stairs-Number of Steps: 4 Home Layout: One level Home Equipment: Tub bench;Walker - 4 wheels;Cane - single point Additional Comments: Wife unable to physically assist.     Prior Function Level of Independence: Independent with assistive device(s)         Comments: USed cane for ambulation, however, reports difficulty walking.      Hand Dominance        Extremity/Trunk Assessment   Upper Extremity Assessment Upper Extremity Assessment: Defer to OT evaluation    Lower Extremity Assessment Lower Extremity Assessment: Generalized weakness    Cervical / Trunk Assessment Cervical /  Trunk Assessment: Kyphotic  Communication   Communication: No difficulties  Cognition Arousal/Alertness: Awake/alert Behavior During Therapy: WFL for tasks assessed/performed Overall Cognitive Status: Impaired/Different from baseline Area of Impairment: Problem solving;Orientation                 Orientation Level: Disoriented to;Situation            Problem Solving: Slow processing General Comments: Pt oriented to time, place, and person. Increased time required for sequencing.       General Comments      Exercises     Assessment/Plan    PT Assessment Patient needs continued PT services  PT Problem List Decreased strength;Decreased balance;Decreased activity tolerance;Decreased mobility;Decreased knowledge of use of DME;Decreased knowledge of precautions;Decreased cognition       PT Treatment Interventions Gait training;DME instruction;Stair training;Functional mobility training;Therapeutic activities;Therapeutic exercise;Balance training;Patient/family education    PT Goals (Current goals can be found in the Care Plan section)  Acute Rehab PT Goals Patient Stated Goal: to go home PT Goal Formulation: With patient Time For Goal Achievement: 06/08/19 Potential to Achieve Goals: Good    Frequency Min 3X/week   Barriers to discharge Decreased caregiver support Wife unable to physically assist    Co-evaluation               AM-PAC PT "6 Clicks" Mobility  Outcome Measure Help needed turning from your back to your side while in a flat bed without using bedrails?: A Little Help needed moving from lying on your back to sitting on the side of a flat bed without using bedrails?: A Lot Help needed moving to and from a bed to a chair (including a wheelchair)?: A Lot Help needed standing up from a chair using your arms (e.g., wheelchair or bedside chair)?: A Little Help needed to walk in hospital room?: A Lot Help needed climbing 3-5 steps with a railing? : A Lot 6 Click Score: 14    End of Session Equipment Utilized During Treatment: Gait belt Activity Tolerance: Patient tolerated treatment well Patient left: in bed;with call bell/phone within reach;with family/visitor present Nurse Communication: Mobility status;Other (comment)(pt had BM) PT Visit Diagnosis: Unsteadiness on feet (R26.81);Muscle weakness  (generalized) (M62.81)    Time: 2353-6144 PT Time Calculation (min) (ACUTE ONLY): 23 min   Charges:   PT Evaluation $PT Eval Moderate Complexity: 1 Mod PT Treatments $Therapeutic Activity: 8-22 mins        Cindee Salt, DPT  Acute Rehabilitation Services  Pager: [phone] Office: [phone]   Lehman Prom 05/25/2019, 4:05 PM

## 2019-05-25 NOTE — ED Notes (Signed)
Lunch Tray Ordered @1144. 

## 2019-05-25 NOTE — ED Notes (Addendum)
Bladder Scan showed urine.

## 2019-05-25 NOTE — ED Triage Notes (Signed)
Pt came in [facility] c/o of pt having difficulty breathing, vomiting, and not being able to move as much. When EMS arrived pt had a witnessed tonic-clonic seizure that lasted 30-45 seconds and then developed a Left sided gaze. Since the seizure pt is LOC and not verbally responsive. EMS placed an 18GIV LAC. Pt is on a NRB and has a nasal trumpet in place.

## 2019-05-25 NOTE — H&P (Signed)
[facility] Admission History and Physical Service Pager: [phone]  Patient name:  [name] Medical record number: [id number] Date of birth: [birth year] Age: 74 y.o. Gender: male  Primary Care Provider: Danna Hefty, DO Consultants: None Code Status: Full Preferred Emergency Contact: Collier Salina (w) [phone]:    Chief Complaint: vomiting and chills  Assessment and Plan: Brian Burns is a 73 y.o. male presenting with fever and vomiting. PMH is significant for multiple UTIs, hypertension, CVA, paroxysmal A. fib on Xarelto, hyperlipidemia, anemia, BPH, urethral strictures  Sepsis Presented w/ 3 episodes of vomiting, dysuria, and shaking earlier tonight. Hx of UTI w/ BPH and urethral strictures. Met 3/4 sepsis criteria (no leukocytosis)  w/ UA that showed LE, hgb, proteinuria and a lactic acid initially >11. No signs/symptoms/labs to indicate a source of infection other than urinary. LA decreased significantly with fluid boluses. Fever has resolved but pt remains tachycardic. Pt was started on vanc/cefepime/flagyl in ED and given 3L LR boluses. Will continue w/ abx and fluids as needed.  - admit to med telemetry, Dr. Erin Hearing attending.  - switch abx to CTX given likelihood of Urinary source - continue to trend LA to normal.  - IVF: 167m/hr NS - tylenol PRN for fever/pain - PM BMP - AM BMP, CBC, PT/INR - f/u PCT and CK - vitals per routine - continuous cardiac monitoring - continous pulse ox - PT/OT eval and treat - incentive spirometry  Seizure like activity Occurred after EMS arrived. Description appears consistent with generalized clonic-tonic seizure lasting < 1 minute. Self resolving. No further episodes. Daughter reported one episode with a prior admission that was not worked up further. No hx of seizures. Discussed with daughter who is his decision maker if they would like neurology consulted to further work up but it seems like  both instances are secondary to severe illness/sepsis.   - consider neurology consult - neuro checks q4h.  - avoid epileptogenic medications  BPH with urethral stricture On finasteride and flomax at home.  Sees urology who recently recommended pt in and out cath but pt declined this. Has had dilation, urethrotomy, and urethroplasty in the past.  Has a hx of UTI likely from retention of urine 2/2 strictures and bph.   - continue finasteride - continue flomax  Hypertension - Normotensive today. Has been told to reduce lisinopril to 547mfrom 1067m/t side effects of dizziness but was still taking 50m60mfore admission.  - hold lisinopril.  - if restarting, restart at 5mg.40mProxysmal A Fib In sinus tach on ekg today. Is on xarelto at home but does not appear to be on rate/rhythm control. Pt has 4 ekg's since 2018 that show regular rhythm. Some show bradycardia, which may be why he is not on rate control medications.  - continue home xarelto  HLD Last lipid panel 2017 with LDL of 90.  - continue atorvostatin 40mg 62mu lipid panel  Anemia Consistently < 12 g/dl going back to 2015. Currently 12.6 on this admission. Normocytic.  - am cbc - holding home iron in setting of sepsis  DM2  Previous a1c values b/w 6.5-7.0.  Does not appear to be on antiglycemic medications at home.   - f/u a1c - sSSI  Hx of chronic pancreatitis/FTT During an admission in 2017 for failure to thrive patient was started on Creon.  Has been continued on this since that time.  Appears well tolerated.   - continue creon.   Depression  On celexa 17m at home.  Not currently endorsing depression.  - continue celexa.   FEN/GI:  -Heart Healthy/Average Carb -IVF normal saline 1060m Prophylaxis:  -Xarelto  Disposition: Med Tele, Dr. ChErin HearingHistory of Present Illness:  Brian WECKWERTHs a [age].o. male presenting with chills and seizure like activity.  History obtained from daughter and  patient.  Daughter reports patient began vomiting and shaking tonight around 11 PM.  Also reports that he felt really cold.  Vomited x3 NBNB emesis.  Reports was fine up until tonight.  Denies any fevers, sick contacts.  Denies any nausea, abdominal pain, chest pain, shortness of breath.  Reports no diarrhea or constipation.  Patient endorses having burning sensation when voiding, no urgency or frequency.  Patient has history of urethral strictures.  Being seen by urology and nephrology outpatient.  Per daughter urology was recommending catheter placement, however patient did not want this.  Last UTI in November 2020.  Patient also endorses some dizziness.  Was recently seen by nephrology who had started him on lisinopril 20 mg daily.  Has been taking this for about 3 weeks when he started to become dizzy.  Seen by Dr. BlCriss Rosalesho had decreased his lisinopril to 10 mg daily.  He continues to have some dizziness without visual disturbances.  He also endorses some headaches for about 3 to 4 days.  Of note when EMS was called patient had seizure-like activity.  Witnessed by daughter, she reports patient eyes were rolling in the back of his head and he had full body movements.  She reports seizure activity lasting between 30-60 seconds.  No medications were given to break seizure.  She reports that he is now back at baseline.  In the ED he had a rectal temp of 100.4, he was tachycardic and tachypneic.  Blood pressure remained stable at 111/71.  He received 3 L Ringer's lactate and was started on vancomycin, cefepime and metronidazole.  Labs significant for lactic acid 11/5.1/3.7, creatinine 2.15/1.97, and UA positive for large amount of leukocytes and hemoglobin.  CT head was negative, chest x-ray was negative.  Blood and urine cultures pending.  Review Of Systems: Per HPI with the following additions:   Review of Systems  Constitutional: Positive for chills. Negative for appetite change, diaphoresis, fatigue and  unexpected weight change.  HENT: Negative for sore throat and trouble swallowing.   Respiratory: Negative for apnea, cough, choking, chest tightness, shortness of breath and wheezing.   Cardiovascular: Negative for chest pain, palpitations and leg swelling.  Gastrointestinal: Positive for vomiting. Negative for abdominal distention, abdominal pain, blood in stool, constipation, diarrhea and nausea.  Endocrine: Negative for polydipsia, polyphagia and polyuria.  Genitourinary: Positive for difficulty urinating and dysuria. Negative for decreased urine volume, flank pain, frequency, genital sores, hematuria and urgency.  Musculoskeletal: Positive for back pain.  Neurological: Positive for dizziness, seizures, speech difficulty and headaches. Negative for facial asymmetry.  Psychiatric/Behavioral: Negative for confusion.     Patient Active Problem List   Diagnosis Date Noted  . Urge incontinence 12/08/2018  . Stenosis of lateral recess of lumbar spine 12/08/2018  . Dementia, vascular ([place]11/10/2018  . Morbid obesity ([place]11/05/2018  . Decreased visual acuity of left eye  12/02/2018  . Gross hematuria 10/29/2018  . Other abnormalities of gait and mobility 09/02/2018  . Muscle weakness (generalized) 04/08/2018  . Lumbar spinal stenosis 04/05/2018  . Chronic pain of right knee 03/07/2017  . Chronic low back pain without sciatica  03/07/2017  . Nonischemic cardiomyopathy ([place]) 09/25/2016  . Microalbuminuria 07/26/2015  . Paroxysmal atrial fibrillation (H[facility])   . Coronary artery disease involving native coronary artery of native heart without angina pectoris   . Stricture, urethra   . CKD stage 3 due to type 2 diabetes mellitus ([place]) 03/16/2015  . Depression 03/16/2015  . Normocytic anemia   . History of Multiple lacunar infarcts ([place]) 01/31/2015  . GERD (gastroesophageal reflux disease) 10/21/2013  . AAA (abdominal aortic aneurysm) without rupture ([place]) 06/24/2013  . PVD (peripheral  vascular disease) ([place]) 06/24/2013  . Orthostatic dizziness 03/18/2013  . Hearing loss of both ears 03/18/2013  . BPH (benign prostatic hyperplasia) 02/16/2013  . Type 2 diabetes mellitus with diabetic neuropathy, unspecified ([place]) 05/10/2012  . Type 2 diabetes mellitus with hypercholesterolemia ([place]) 02/05/2007  . Hypertension associated with diabetes ([facility]) 03/27/2006    Past Medical History: Past Medical History:  Diagnosis Date  . AAA (abdominal aortic aneurysm) without rupture ([place]) 06/2014   no change in last exam from 2015  . Achalasia   . Arthritis    knees,lower back  . Atrial fibrillation ([place])   . Atrial flutter with rapid ventricular response ([place]) 01/16/2015  . Bilateral wrist pain 09/09/2014  . Bladder outlet obstruction 01/20/2015  . Bradycardia 08/27/2016  . CAD (coronary artery disease)    pt denies  . Carotid artery disease ([place])   . Chronic back pain   . Chronic kidney disease (CKD), stage III (moderate)   . Decreased visual acuity of left eye  12/02/2018  . Dyspnea    reports today no pregoression  . Erectile dysfunction   . GERD (gastroesophageal reflux disease)   . Headache    hx of  . History of CVA (cerebrovascular accident)    2009  &  2013  . History of Multiple lacunar infarcts ([place]) 01/31/2015   MRI Brain 01/31/2015: Widespread old lacunar infarcts, particularly right Basal ganglia, pons, and right cerebellum  . HLD (hyperlipidemia)   . Hypertension   . Idiopathic pancreatitis 12/2014   acute  . Knee pain, bilateral 04/09/2011  . Malnutrition of moderate degree 01/25/2015  . Memory impairment    mild dementia per PCP progress note   . Morbid obesity ([place]) 12/03/2018  . Other abnormalities of gait and mobility 12/03/2018  . Pre-diabetes    per pt. no meds  . PVD (peripheral vascular disease) with claudication ([place])   . Urethral stricture 02/16/2013  . Urge incontinence 12/08/2018    Past Surgical History: Past Surgical History:  Procedure  Laterality Date  . back injections     [employer]  . BALLOON DILATION N/A 12/16/2018   Procedure: URETHRAL BALLOON DILATION;  Surgeon: Ardis Hughs, MD;  Location: [facility];  Service: Urology;  Laterality: N/A;  . CARDIOVASCULAR STRESS TEST  08-09-2009   mild global hypokinesis,  no ischemia or scar/  ef 41%  . CATARACT EXTRACTION W/ INTRAOCULAR LENS  IMPLANT, BILATERAL  april and may 2014  . CYSTOSCOPY N/A 01/17/2017   Procedure: CYSTOSCOPY FLEXIBLE;  Surgeon: Ardis Hughs, MD;  Location: [facility];  Service: Urology;  Laterality: N/A;  . CYSTOSCOPY WITH RETROGRADE URETHROGRAM N/A 11/24/2014   Procedure: CYSTOSCOPY WITH RETROGRADE URETHROGRAM;  Surgeon: Ardis Hughs, MD;  Location: [facility];  Service: Urology;  Laterality: N/A;  . CYSTOSCOPY WITH RETROGRADE URETHROGRAM N/A 12/16/2018   Procedure: CYSTOSCOPY WITH RETROGRADE URETHROGRAM;  Surgeon: Ardis Hughs, MD;  Location: [facility];  Service: Urology;  Laterality: N/A;  . CYSTOSCOPY WITH URETHRAL DILATATION N/A 11/24/2014   Procedure: CYSTOSCOPY WITH URETHRAL DILATATION;  Surgeon: Ardis Hughs, MD;  Location: [facility];  Service: Urology;  Laterality: N/A;  BALLOON DILATION        . ESOPHAGOGASTRODUODENOSCOPY N/A 05/15/2014   Procedure: ESOPHAGOGASTRODUODENOSCOPY (EGD);  Surgeon: Laurence Spates, MD;  Location[facility];  Service: Endoscopy;  Laterality: N/A;  . ESOPHAGOGASTRODUODENOSCOPY Left 02/02/2015   Procedure: ESOPHAGOGASTRODUODENOSCOPY (EGD);  Surgeon: Teena Irani, MD;  Location[facility];  Service: Endoscopy;  Laterality: Left;  . ESOPHAGOGASTRODUODENOSCOPY Left 03/16/2015   Procedure: ESOPHAGOGASTRODUODENOSCOPY (EGD);  Surgeon: Teena Irani, MD;  Location: [facility];  Service: Endoscopy;  Laterality: Left;  . TRANSTHORACIC ECHOCARDIOGRAM  01-07-2012   mild to moderate dilated LV,  ef 45-50%,  mild basal hypokinesis,  grade I diastolic  dysfunction/   trivial AR/  mild MR  . URETHROPLASTY N/A 01/17/2017   Procedure: URETHEROPLASTY BUCCAL GRAFT AND BUCCAL MUCOSA GRAFT HARVEST;  Surgeon: Ardis Hughs, MD;  Location: [facility];  Service: Urology;  Laterality: N/A;    Social History: Social History   Tobacco Use  . Smoking status: Former Smoker    Packs/day: 0.50    Years: 40.00    Pack years: 20.00    Types: Cigarettes    Quit date: 01/29/1999    Years since quitting: 20.3  . Smokeless tobacco: Never Used  Substance Use Topics  . Alcohol use: No    Alcohol/week: 0.0 standard drinks    Comment: hx of quit in 2006  . Drug use: Never   Additional social history: Lives with wife Please also refer to relevant sections of EMR.  Family History: Family History  Problem Relation Age of Onset  . Lung disease Brother   . Kidney disease Brother   . Heart disease Brother        died at 61  . Heart disease Mother        died at 67  . Kidney disease Mother   . Lung disease Mother    Allergies and Medications: Allergies  Allergen Reactions  . Gabapentin Other (See Comments)    Sedation at 326m TID   No current facility-administered medications on file prior to encounter.   Current Outpatient Medications on File Prior to Encounter  Medication Sig Dispense Refill  . acetaminophen (TYLENOL) 325 MG tablet Take 325 mg by mouth every 6 (six) hours as needed for moderate pain.    .Marland Kitchenatorvastatin (LIPITOR) 40 MG tablet Take 1 tablet (40 mg total) by mouth daily. 90 tablet 1  . citalopram (CELEXA) 40 MG tablet Take 1 tablet (40 mg total) by mouth at bedtime. 90 tablet 2  . ergocalciferol (VITAMIN D2) 1.25 MG (50000 UT) capsule Take 50,000 Units by mouth once a week.    . ferrous sulfate 325 (65 FE) MG tablet take 1 tablet by mouth three times a day with meals (Patient taking differently: Take 325 mg by mouth every other day. At night) 90 tablet 3  . finasteride (PROSCAR) 5 MG tablet Take 1 tablet (5 mg total) by mouth at bedtime. 90  tablet 2  . lipase/protease/amylase (CREON) 12000 units CPEP capsule take 1 capsule by mouth three times a day with meals (Patient taking differently: Take 12,000 Units by mouth 2 (two) times daily. ) 270 capsule 3  . lisinopril (ZESTRIL) 20 MG tablet Take 10 mg by mouth daily.     . Rivaroxaban (XARELTO) 15 MG TABS tablet Take 1  tablet (15 mg total) by mouth at bedtime. With a meal.  Resume 48 hours after there is no longer visible blood in the urine (Patient taking differently: Take 20 mg by mouth at bedtime. )    . tamsulosin (FLOMAX) 0.4 MG CAPS capsule TAKE 1 CAPSULE(0.4 MG) BY MOUTH DAILY (Patient taking differently: Take 0.4 mg by mouth daily. ) 30 capsule 3    Objective: BP 129/77   Pulse 76   Temp (!) 104 F (40 C) (Rectal)   Resp 14   Ht '6\' 4"'  (1.93 m)   Wt 98.1 kg   SpO2 94%   BMI 26.33 kg/m  Exam: General: Alert and oriented, no apparent distress. Laying on his right side d/t back pain.   Eyes: PEERLA. Arcus senilis  ENTM: No pharyengeal erythema Neck: nontender, no JVD appreciated. Cardiovascular: RRR with no murmurs noted. 2+ radial pulses b/l.  Respiratory: CTA bilaterally  Gastrointestinal: Bowel sounds present. No abdominal pain MSK: Upper extremity strength 4/5 bilaterally, Lower extremity strength 4/5 on right dorsi/plantar flexion, 5/5 on left.  Derm: No rashes noted Neuro: CN III-XII intact, sensory intact, Rt side weakness and severe dysarthria, residual from previous CVA.  Neg Kernig sign Psych: Behavior and speech appropriate to situation  Labs and Imaging: CBC BMET  Recent Labs  Lab 05/25/19 0115 05/25/19 0115 05/25/19 0124  WBC 7.0  --   --   HGB 11.5*   < > 12.6*  HCT 37.6*   < > 37.0*  PLT 128*  --   --    < > = values in this interval not displayed.   Recent Labs  Lab 05/25/19 0310  NA 135  K 3.5  CL 104  CO2 19*  BUN 19  CREATININE 1.97*  GLUCOSE 139*  CALCIUM 8.1*     EKG: sinus tachycardia  CT HEAD WO CONTRAST  Result Date:  05/25/2019 CLINICAL DATA:  Seizure EXAM: CT HEAD WITHOUT CONTRAST TECHNIQUE: Contiguous axial images were obtained from the base of the skull through the vertex without intravenous contrast. COMPARISON:  Head CT 01/24/2015 FINDINGS: Brain: There is no mass, hemorrhage or extra-axial collection. There is generalized atrophy without lobar predilection. Hypodensity of the white matter is most commonly associated with chronic microvascular disease. There are old bilateral basal ganglia small vessel infarcts. Vascular: Atherosclerotic calcification of the vertebral and internal carotid arteries at the skull base. No abnormal hyperdensity of the major intracranial arteries or dural venous sinuses. Skull: The visualized skull base, calvarium and extracranial soft tissues are normal. Sinuses/Orbits: No fluid levels or advanced mucosal thickening of the visualized paranasal sinuses. No mastoid or middle ear effusion. The orbits are normal. IMPRESSION: Chronic microvascular ischemia and generalized atrophy without acute intracranial abnormality. Electronically Signed   By: Ulyses Jarred M.D.   On: 05/25/2019 02:26   DG Chest Port 1 View  Result Date: 05/25/2019 CLINICAL DATA:  Fever. EXAM: PORTABLE CHEST 1 VIEW COMPARISON:  July 14, 2017 FINDINGS: Mild, stable increased lung markings are seen involving predominantly the bilateral lung bases. There is no evidence of acute infiltrate, pleural effusion or pneumothorax. The heart size and mediastinal contours are within normal limits. There is mild calcification of the aortic arch. The visualized skeletal structures are unremarkable. IMPRESSION: No active disease. Electronically Signed   By: Virgina Norfolk M.D.   On: 05/25/2019 01:31    Carollee Leitz, MD 05/25/2019, 5:31 AM PGY-1, [place] Intern pager: [phone], text pages welcome  Resident Addendum I have  separately seen and examined the patient.  I have discussed the findings and exam with  the resident and agree with the above note.  I helped develop the management plan that is described in the resident's note and I agree with the content.  Changes have been made in BLUE.    Addison Naegeli, MD PGY-2 [facility] residency program

## 2019-05-25 NOTE — Progress Notes (Signed)
Pharmacy Antibiotic Note  Brian Burns is a 74 y.o. male admitted on 05/25/2019 with shortness of breath/seizure.  Pharmacy has been consulted for Vancomycin/Cefepime dosing for r/o sepsis. WBC WNL. Noted renal dysfunction. Lactic acid elevated.   Plan: Vancomycin 1250 mg IV q24h >>Estimated AUC: 455 Cefepime 2g IV q12h Trend WBC, temp, renal function  F/U infectious work-up Drug levels as indicated   Height: 6\' 4"  (193 cm) Weight: 98.1 kg (216 lb 4.3 oz) IBW/kg (Calculated) : 86.8  Temp (24hrs), Avg:101 F (38.3 C), Min:98 F (36.7 C), Max:104 F (40 C)  Recent Labs  Lab 05/25/19 0115 05/25/19 0124 05/25/19 0310 05/25/19 0315  WBC 7.0  --   --   --   CREATININE 2.15* 1.90* 1.97*  --   LATICACIDVEN >11.0*  --   --  5.1*    Estimated Creatinine Clearance: 41.6 mL/min (A) (by C-G formula based on SCr of 1.97 mg/dL (H)).    Allergies  Allergen Reactions  . Gabapentin Other (See Comments)    Sedation at 300mg  TID    05/27/19, PharmD, BCPS Clinical Pharmacist Phone: [phone]

## 2019-05-25 NOTE — ED Notes (Signed)
In and Out attempted by RN. Unsuccessful on urine retainment. Per family patient has a urinary sticture and makes urine at home. MD made aware.

## 2019-05-25 NOTE — ED Provider Notes (Signed)
[facility] EMERGENCY DEPARTMENT Provider Note   CSN: [id number] Arrival date & time: 05/25/19  0057     History Chief Complaint - altered mental status Level 5 caveat due to altered mental status Brian MEECH is a 74 y.o. male.  The history is provided by the EMS personnel.  Altered Mental Status Presenting symptoms: confusion   Severity:  Severe Most recent episode:  Today Timing:  Constant Progression:  Worsening Chronicity:  New Patient presents from home via EMS for altered mental status.  Patient is unable to provide any history.  EMS reports their first called out for "sick call " On their arrival, patient appeared short of breath and was vomiting.  Patient was conversant but appeared confused.  On transport, patient was noted to have a generalized tonic-clonic seizure lasted approximately 45 seconds.  They then report he had a left-sided gaze.  Patient seizure terminated spontaneously and has been more awake but not verbally responsive.  Patient's glucose is over 100 per EMS.  No other details known at this time     Past Medical History:  Diagnosis Date  . AAA (abdominal aortic aneurysm) without rupture ([place]) 06/2014   no change in last exam from 2015  . Achalasia   . Arthritis    knees,lower back  . Atrial fibrillation ([place])   . Atrial flutter with rapid ventricular response ([place]) 01/16/2015  . Bilateral wrist pain 09/09/2014  . Bladder outlet obstruction 01/20/2015  . Bradycardia 08/27/2016  . CAD (coronary artery disease)    pt denies  . Carotid artery disease ([place])   . Chronic back pain   . Chronic kidney disease (CKD), stage III (moderate)   . Decreased visual acuity of left eye  12/02/2018  . Dyspnea    reports today no pregoression  . Erectile dysfunction   . GERD (gastroesophageal reflux disease)   . Headache    hx of  . History of CVA (cerebrovascular accident)    2009  &  2013  . History of Multiple lacunar infarcts ([place]) 01/31/2015    MRI Brain 01/31/2015: Widespread old lacunar infarcts, particularly right Basal ganglia, pons, and right cerebellum  . HLD (hyperlipidemia)   . Hypertension   . Idiopathic pancreatitis 12/2014   acute  . Knee pain, bilateral 04/09/2011  . Malnutrition of moderate degree 01/25/2015  . Memory impairment    mild dementia per PCP progress note   . Morbid obesity ([place]) 12/03/2018  . Other abnormalities of gait and mobility 12/03/2018  . Pre-diabetes    per pt. no meds  . PVD (peripheral vascular disease) with claudication ([place])   . Urethral stricture 02/16/2013  . Urge incontinence 12/08/2018    Patient Active Problem List   Diagnosis Date Noted  . Urge incontinence 12/08/2018  . Stenosis of lateral recess of lumbar spine 12/08/2018  . Dementia, vascular ([place]) 12/08/2018  . Morbid obesity ([place]) 12/03/2018  . Decreased visual acuity of left eye  12/02/2018  . Gross hematuria 10/29/2018  . Other abnormalities of gait and mobility 09/02/2018  . Muscle weakness (generalized) 04/08/2018  . Lumbar spinal stenosis 04/05/2018  . Chronic pain of right knee 03/07/2017  . Chronic low back pain without sciatica 03/07/2017  . Nonischemic cardiomyopathy ([place]) 09/25/2016  . Microalbuminuria 07/26/2015  . Paroxysmal atrial fibrillation (HCC)   . Coronary artery disease involving native coronary artery of native heart without angina pectoris   . Stricture, urethra   . CKD stage 3 due to type 2  diabetes mellitus (HCC) 03/16/2015  . Depression 03/16/2015  . Normocytic anemia   . History of Multiple lacunar infarcts (HCC) 01/31/2015  . GERD (gastroesophageal reflux disease) 10/21/2013  . AAA (abdominal aortic aneurysm) without rupture (HCC) 06/24/2013  . PVD (peripheral vascular disease) (HCC) 06/24/2013  . Orthostatic dizziness 03/18/2013  . Hearing loss of both ears 03/18/2013  . BPH (benign prostatic hyperplasia) 02/16/2013  . Type 2 diabetes mellitus with diabetic neuropathy, unspecified (HCC)  05/10/2012  . Type 2 diabetes mellitus with hypercholesterolemia (HCC) 02/05/2007  . Hypertension associated with diabetes (HCC) 03/27/2006    Past Surgical History:  Procedure Laterality Date  . back injections     [facility]  . BALLOON DILATION N/A 12/16/2018   Procedure: URETHRAL BALLOON DILATION;  Surgeon: Crist Fat, MD;  Location: [facility];  Service: Urology;  Laterality: N/A;  . CARDIOVASCULAR STRESS TEST  08-09-2009   mild global hypokinesis,  no ischemia or scar/  ef 41%  . CATARACT EXTRACTION W/ INTRAOCULAR LENS  IMPLANT, BILATERAL  april and may 2014  . CYSTOSCOPY N/A 01/17/2017   Procedure: CYSTOSCOPY FLEXIBLE;  Surgeon: Crist Fat, MD;  Location: [facility];  Service: Urology;  Laterality: N/A;  . CYSTOSCOPY WITH RETROGRADE URETHROGRAM N/A 11/24/2014   Procedure: CYSTOSCOPY WITH RETROGRADE URETHROGRAM;  Surgeon: Crist Fat, MD;  Location: [facility];  Service: Urology;  Laterality: N/A;  . CYSTOSCOPY WITH RETROGRADE URETHROGRAM N/A 12/16/2018   Procedure: CYSTOSCOPY WITH RETROGRADE URETHROGRAM;  Surgeon: Crist Fat, MD;  Location: [facility];  Service: Urology;  Laterality: N/A;  . CYSTOSCOPY WITH URETHRAL DILATATION N/A 11/24/2014   Procedure: CYSTOSCOPY WITH URETHRAL DILATATION;  Surgeon: Crist Fat, MD;  Location: [facility];  Service: Urology;  Laterality: N/A;  BALLOON DILATION        . ESOPHAGOGASTRODUODENOSCOPY N/A 05/15/2014   Procedure: ESOPHAGOGASTRODUODENOSCOPY (EGD);  Surgeon: Carman Ching, MD;  Location: [facility] ENDOSCOPY;  Service: Endoscopy;  Laterality: N/A;  . ESOPHAGOGASTRODUODENOSCOPY Left 02/02/2015   Procedure: ESOPHAGOGASTRODUODENOSCOPY (EGD);  Surgeon: Dorena Cookey, MD;  Location: [facility] ENDOSCOPY;  Service: Endoscopy;  Laterality: Left;  . ESOPHAGOGASTRODUODENOSCOPY Left 03/16/2015   Procedure: ESOPHAGOGASTRODUODENOSCOPY (EGD);  Surgeon: Dorena Cookey, MD;  Location: [facility] ENDOSCOPY;   Service: Endoscopy;  Laterality: Left;  . TRANSTHORACIC ECHOCARDIOGRAM  01-07-2012   mild to moderate dilated LV,  ef 45-50%,  mild basal hypokinesis,  grade I diastolic  dysfunction/  trivial AR/  mild MR  . URETHROPLASTY N/A 01/17/2017   Procedure: URETHEROPLASTY BUCCAL GRAFT AND BUCCAL MUCOSA GRAFT HARVEST;  Surgeon: Crist Fat, MD;  Location: [facility];  Service: Urology;  Laterality: N/A;       Family History  Problem Relation Age of Onset  . Lung disease Brother   . Kidney disease Brother   . Heart disease Brother        died at 66  . Heart disease Mother        died at 52  . Kidney disease Mother   . Lung disease Mother     Social History   Tobacco Use  . Smoking status: Former Smoker    Packs/day: 0.50    Years: 40.00    Pack years: 20.00    Types: Cigarettes    Quit date: 01/29/1999    Years since quitting: 20.3  . Smokeless tobacco: Never Used  Substance Use Topics  . Alcohol use: No    Alcohol/week: 0.0 standard drinks    Comment: hx of quit in  2006  . Drug use: Never    Home Medications Prior to Admission medications   Medication Sig Start Date End Date Taking? Authorizing Provider  acetaminophen (TYLENOL) 325 MG tablet Take 325 mg by mouth every 6 (six) hours as needed for moderate pain.    [provider]  atorvastatin (LIPITOR) 40 MG tablet Take 1 tablet (40 mg total) by mouth daily. 01/27/19   Mullis, Kiersten P, DO  citalopram (CELEXA) 40 MG tablet Take 1 tablet (40 mg total) by mouth at bedtime. 01/09/19   Mullis, Kiersten P, DO  ferrous sulfate 325 (65 FE) MG tablet take 1 tablet by mouth three times a day with meals Patient taking differently: Take 325 mg by mouth every other day. At night 02/22/16   Erasmo Downer, MD  finasteride (PROSCAR) 5 MG tablet Take 1 tablet (5 mg total) by mouth at bedtime. 02/23/19   Mullis, Kiersten P, DO  gabapentin (NEURONTIN) 300 MG capsule Take 300 mg by mouth daily as needed.    [provider]  lipase/protease/amylase (CREON) 12000 units CPEP capsule take 1 capsule by mouth three times a day with meals 01/12/19   Mullis, Kiersten P, DO  lisinopril (ZESTRIL) 20 MG tablet Take 10 mg by mouth daily. 1/2 tab daily 04/27/19   [provider]  Rivaroxaban (XARELTO) 15 MG TABS tablet Take 1 tablet (15 mg total) by mouth at bedtime. With a meal.  Resume 48 hours after there is no longer visible blood in the urine 12/16/18   Crist Fat, MD  tamsulosin (FLOMAX) 0.4 MG CAPS capsule TAKE 1 CAPSULE(0.4 MG) BY MOUTH DAILY 02/04/19   Mullis, Kiersten P, DO    Allergies    Gabapentin  Review of Systems   Review of Systems  Unable to perform ROS: Mental status change  Psychiatric/Behavioral: Positive for confusion.    Physical Exam Updated Vital Signs BP 115/78 (BP Location: Right Arm)   Pulse (!) 131   Temp (!) 104 F (40 C) (Rectal)   Resp (!) 28   Ht 1.93 m (6\' 4" )   Wt 98.1 kg   SpO2 97%   BMI 26.33 kg/m   Physical Exam CONSTITUTIONAL: Elderly, ill-appearing, appears confused HEAD: Normocephalic/atraumatic EYES: Pupils equal bilaterally, no nystagmus ENMT: Mucous membranes moist, nasal trauma in left nare, nonrebreather mask in place NECK: supple no meningeal signs SPINE/BACK:entire spine nontender CV: S1/S2 noted, tachycardic LUNGS: Tachypnea with coarse breath sounds bilaterally ABDOMEN: soft, nondistended GU: Patient wearing diaper, no erythema or edema noted to genitalia NEURO: Pt is awake but appears confused.  He does not follow commands EXTREMITIES: pulses normal/equal, full ROM, no signs of trauma SKIN: warm, color normal PSYCH: Unable to assess  ED Results / Procedures / Treatments   Labs (all labs ordered are listed, but only abnormal results are displayed) Labs Reviewed  PROTIME-INR - Abnormal; Notable for the following components:      Result Value   Prothrombin Time 16.9 (*)    INR 1.4 (*)    All other components within  normal limits  CBC - Abnormal; Notable for the following components:   RBC 4.05 (*)    Hemoglobin 11.5 (*)    HCT 37.6 (*)    RDW 16.9 (*)    Platelets 128 (*)    All other components within normal limits  COMPREHENSIVE METABOLIC PANEL - Abnormal; Notable for the following components:   Sodium 130 (*)    CO2 10 (*)    Glucose, Bld  188 (*)    Creatinine, Ser 2.15 (*)    Calcium 8.7 (*)    GFR calc non Af Amer 30 (*)    GFR calc Af Amer 34 (*)    Anion gap 21 (*)    All other components within normal limits  URINALYSIS, ROUTINE W REFLEX MICROSCOPIC - Abnormal; Notable for the following components:   APPearance CLOUDY (*)    Hgb urine dipstick LARGE (*)    Protein, ur 100 (*)    Leukocytes,Ua LARGE (*)    RBC / HPF >50 (*)    Bacteria, UA RARE (*)    All other components within normal limits  SALICYLATE LEVEL - Abnormal; Notable for the following components:   Salicylate Lvl <7.0 (*)    All other components within normal limits  ACETAMINOPHEN LEVEL - Abnormal; Notable for the following components:   Acetaminophen (Tylenol), Serum <10 (*)    All other components within normal limits  AMMONIA - Abnormal; Notable for the following components:   Ammonia 192 (*)    All other components within normal limits  LACTIC ACID, PLASMA - Abnormal; Notable for the following components:   Lactic Acid, Venous >11.0 (*)    All other components within normal limits  LACTIC ACID, PLASMA - Abnormal; Notable for the following components:   Lactic Acid, Venous 5.1 (*)    All other components within normal limits  BASIC METABOLIC PANEL - Abnormal; Notable for the following components:   CO2 19 (*)    Glucose, Bld 139 (*)    Creatinine, Ser 1.97 (*)    Calcium 8.1 (*)    GFR calc non Af Amer 33 (*)    GFR calc Af Amer 38 (*)    All other components within normal limits  I-STAT CHEM 8, ED - Abnormal; Notable for the following components:   Creatinine, Ser 1.90 (*)    Glucose, Bld 174 (*)     Calcium, Ion 1.09 (*)    TCO2 11 (*)    Hemoglobin 12.6 (*)    HCT 37.0 (*)    All other components within normal limits  CBG MONITORING, ED - Abnormal; Notable for the following components:   Glucose-Capillary 165 (*)    All other components within normal limits  RESPIRATORY PANEL BY RT PCR (FLU A&B, COVID)  CULTURE, BLOOD (ROUTINE X 2)  CULTURE, BLOOD (ROUTINE X 2)  URINE CULTURE  ETHANOL  APTT  DIFFERENTIAL  RAPID URINE DRUG SCREEN, HOSP PERFORMED  LIPASE, BLOOD  LACTIC ACID, PLASMA  LACTIC ACID, PLASMA    EKG ED ECG REPORT   Date: 05/25/2019 0102am  Rate: 131  Rhythm: sinus tachycardia  QRS Axis: normal  Intervals: normal  ST/T Wave abnormalities: nonspecific ST changes  Conduction Disutrbances:none  Narrative Interpretation:   Old EKG Reviewed: changes noted  I have personally reviewed the EKG tracing and agree with the computerized printout as noted.  Radiology CT HEAD WO CONTRAST  Result Date: 05/25/2019 CLINICAL DATA:  Seizure EXAM: CT HEAD WITHOUT CONTRAST TECHNIQUE: Contiguous axial images were obtained from the base of the skull through the vertex without intravenous contrast. COMPARISON:  Head CT 01/24/2015 FINDINGS: Brain: There is no mass, hemorrhage or extra-axial collection. There is generalized atrophy without lobar predilection. Hypodensity of the white matter is most commonly associated with chronic microvascular disease. There are old bilateral basal ganglia small vessel infarcts. Vascular: Atherosclerotic calcification of the vertebral and internal carotid arteries at the skull base. No abnormal hyperdensity of the  major intracranial arteries or dural venous sinuses. Skull: The visualized skull base, calvarium and extracranial soft tissues are normal. Sinuses/Orbits: No fluid levels or advanced mucosal thickening of the visualized paranasal sinuses. No mastoid or middle ear effusion. The orbits are normal. IMPRESSION: Chronic microvascular ischemia and  generalized atrophy without acute intracranial abnormality. Electronically Signed   By: Deatra Robinson M.D.   On: 05/25/2019 02:26   DG Chest Port 1 View  Result Date: 05/25/2019 CLINICAL DATA:  Fever. EXAM: PORTABLE CHEST 1 VIEW COMPARISON:  July 14, 2017 FINDINGS: Mild, stable increased lung markings are seen involving predominantly the bilateral lung bases. There is no evidence of acute infiltrate, pleural effusion or pneumothorax. The heart size and mediastinal contours are within normal limits. There is mild calcification of the aortic arch. The visualized skeletal structures are unremarkable. IMPRESSION: No active disease. Electronically Signed   By: Aram Candela M.D.   On: 05/25/2019 01:31    Procedures .Critical Care Performed by: [name], MD Authorized by: [name], MD   Critical care provider statement:    Critical care time (minutes):  80   Critical care start time:  05/25/2019 1:30 AM   Critical care end time:  05/25/2019 2:50 AM   Critical care time was exclusive of:  Separately billable procedures and treating other patients   Critical care was necessary to treat or prevent imminent or life-threatening deterioration of the following conditions:  Sepsis, respiratory failure, shock, CNS failure or compromise and dehydration   Critical care was time spent personally by me on the following activities:  Discussions with consultants, evaluation of patient's response to treatment, examination of patient, review of old charts, pulse oximetry, ordering and review of laboratory studies, ordering and performing treatments and interventions, ordering and review of radiographic studies, re-evaluation of patient's condition, development of treatment plan with patient or surrogate and obtaining history from patient or surrogate   I assumed direction of critical care for this patient from another provider in my specialty: no       Medications Ordered in ED Medications    lactated ringers bolus 1,000 mL (0 mLs Intravenous Stopped 05/25/19 0210)    And  lactated ringers bolus 1,000 mL (0 mLs Intravenous Stopped 05/25/19 0316)    And  lactated ringers bolus 1,000 mL (0 mLs Intravenous Stopped 05/25/19 0316)  ceFEPIme (MAXIPIME) 2 g in sodium chloride 0.9 % 100 mL IVPB (0 g Intravenous Stopped 05/25/19 0211)  metroNIDAZOLE (FLAGYL) IVPB 500 mg (0 mg Intravenous Stopped 05/25/19 0237)  acetaminophen (TYLENOL) suppository 650 mg (650 mg Rectal Given 05/25/19 0134)  vancomycin (VANCOREADY) IVPB 1500 mg/300 mL (0 mg Intravenous Stopped 05/25/19 0403)  ondansetron (ZOFRAN) injection 4 mg (4 mg Intravenous Given 05/25/19 0134)  sodium chloride 0.9 % bolus 1,000 mL (0 mLs Intravenous Stopped 05/25/19 0445)    ED Course  I have reviewed the triage vital signs and the nursing notes.  Pertinent labs & imaging results that were available during my care of the patient were reviewed by me and considered in my medical decision making (see chart for details).    MDM Rules/Calculators/A&P                      1:15 AM Patient seen on arrival for altered mental status.  Patient had a seizure prior to arrival, unclear if he has a history of seizures.  Patient was found to be febrile.  Code sepsis has been called. IV fluids and IV antibiotics  have been ordered.  We will follow closely CT head ordered because patient is on Xarelto and has altered mental status with seizure 2:14 AM BP 111/71   Pulse (!) 108   Temp (!) 104 F (40 C) (Rectal)   Resp (!) 25   Ht 1.93 m (6\' 4" )   Wt 98.1 kg   SpO2 95%   BMI 26.33 kg/m  Patient appears to be improved.  Heart rate improved.  He is more alert and following commands.  He moves all extremities x4. Daughter is at bedside, reports he had been doing well recently with some low blood pressure but had overall been well.  Tonight he appeared to get sick and did have an isolated seizure.  She reports he had a seizure back in 2017 but none  since.  He does have an issue with urethral stricture and has difficulty urinating at baseline Patient has a lactate greater than 11, but suspect this may be due to sepsis + seizure Sepsis - Repeat Assessment  Sepsis hemodynamic reassessment completed at: 2:15 AM  2:33 AM CT head negative.  Vital signs are improving.  Patient is more alert, denies any real complaints at this time.  Will recheck lactate, check urinalysis and BMP 5:34 AM After 3 L of normal saline, lactate is improved to 5.  Anion gap and bicarb of both improved.  Patient's mental status is appropriate.  He is awake alert in no acute distress.  He answers questions appropriately.  His vitals are normalized, current temperature is 98.9.  Heart rate and blood pressure much improved.  I discussed the case with family practice to admit the patient. Patient and daughter updated on plan   This patient presents to the ED for concern of altered mental status, this involves an extensive number of treatment options, and is a complaint that carries with it a high risk of complications and morbidity.  The differential diagnosis includes sepsis, stroke, intracranial hemorrhage, meningitis   Lab Tests:   I Ordered, reviewed, and interpreted labs, which included lactate, blood cultures, metabolic panel, complete blood count  Medicines ordered:   I ordered medication antibiotics and IV fluids for sepsis  Imaging Studies ordered:   I ordered imaging studies which included CT head and chest x-ray  I independently visualized and interpreted imaging which showed no acute finding  Additional history obtained:   Additional history obtained from daughter  Previous records obtained and reviewed   Consultations Obtained:   I consulted family practice and discussed lab and imaging findings  Reevaluation:  After the interventions stated above, I reevaluated the patient and found patient is improving  Critical  Interventions:  . IV fluids and IV antibiotics and sepsis protocol  Final Clinical Impression(s) / ED Diagnoses Final diagnoses:  Encephalopathy  Dehydration  AKI (acute kidney injury) (HCC)  Seizure (HCC)  Acute cystitis with hematuria    Rx / DC Orders ED Discharge Orders    None       2018, MD 05/25/19 [id number]

## 2019-05-26 DIAGNOSIS — I739 Peripheral vascular disease, unspecified: Secondary | ICD-10-CM | POA: Diagnosis not present

## 2019-05-26 DIAGNOSIS — I480 Paroxysmal atrial fibrillation: Secondary | ICD-10-CM | POA: Diagnosis present

## 2019-05-26 DIAGNOSIS — I2510 Atherosclerotic heart disease of native coronary artery without angina pectoris: Secondary | ICD-10-CM | POA: Diagnosis present

## 2019-05-26 DIAGNOSIS — E1151 Type 2 diabetes mellitus with diabetic peripheral angiopathy without gangrene: Secondary | ICD-10-CM | POA: Diagnosis present

## 2019-05-26 DIAGNOSIS — G9341 Metabolic encephalopathy: Secondary | ICD-10-CM | POA: Diagnosis not present

## 2019-05-26 DIAGNOSIS — E7800 Pure hypercholesterolemia, unspecified: Secondary | ICD-10-CM | POA: Diagnosis not present

## 2019-05-26 DIAGNOSIS — R338 Other retention of urine: Secondary | ICD-10-CM | POA: Diagnosis present

## 2019-05-26 DIAGNOSIS — M2550 Pain in unspecified joint: Secondary | ICD-10-CM | POA: Diagnosis not present

## 2019-05-26 DIAGNOSIS — I69398 Other sequelae of cerebral infarction: Secondary | ICD-10-CM | POA: Diagnosis not present

## 2019-05-26 DIAGNOSIS — E860 Dehydration: Secondary | ICD-10-CM | POA: Diagnosis present

## 2019-05-26 DIAGNOSIS — M6281 Muscle weakness (generalized): Secondary | ICD-10-CM | POA: Diagnosis not present

## 2019-05-26 DIAGNOSIS — N35919 Unspecified urethral stricture, male, unspecified site: Secondary | ICD-10-CM | POA: Diagnosis present

## 2019-05-26 DIAGNOSIS — R1311 Dysphagia, oral phase: Secondary | ICD-10-CM | POA: Diagnosis not present

## 2019-05-26 DIAGNOSIS — N401 Enlarged prostate with lower urinary tract symptoms: Secondary | ICD-10-CM | POA: Diagnosis present

## 2019-05-26 DIAGNOSIS — D649 Anemia, unspecified: Secondary | ICD-10-CM | POA: Diagnosis present

## 2019-05-26 DIAGNOSIS — Z20822 Contact with and (suspected) exposure to covid-19: Secondary | ICD-10-CM | POA: Diagnosis not present

## 2019-05-26 DIAGNOSIS — R6520 Severe sepsis without septic shock: Secondary | ICD-10-CM | POA: Diagnosis not present

## 2019-05-26 DIAGNOSIS — N183 Chronic kidney disease, stage 3 unspecified: Secondary | ICD-10-CM | POA: Diagnosis not present

## 2019-05-26 DIAGNOSIS — N179 Acute kidney failure, unspecified: Secondary | ICD-10-CM | POA: Diagnosis not present

## 2019-05-26 DIAGNOSIS — Z7401 Bed confinement status: Secondary | ICD-10-CM | POA: Diagnosis not present

## 2019-05-26 DIAGNOSIS — A419 Sepsis, unspecified organism: Secondary | ICD-10-CM | POA: Diagnosis not present

## 2019-05-26 DIAGNOSIS — F0150 Vascular dementia without behavioral disturbance: Secondary | ICD-10-CM | POA: Diagnosis present

## 2019-05-26 DIAGNOSIS — E1169 Type 2 diabetes mellitus with other specified complication: Secondary | ICD-10-CM | POA: Diagnosis not present

## 2019-05-26 DIAGNOSIS — G9340 Encephalopathy, unspecified: Secondary | ICD-10-CM | POA: Diagnosis not present

## 2019-05-26 DIAGNOSIS — E785 Hyperlipidemia, unspecified: Secondary | ICD-10-CM | POA: Diagnosis present

## 2019-05-26 DIAGNOSIS — E1159 Type 2 diabetes mellitus with other circulatory complications: Secondary | ICD-10-CM | POA: Diagnosis not present

## 2019-05-26 DIAGNOSIS — R808 Other proteinuria: Secondary | ICD-10-CM | POA: Diagnosis not present

## 2019-05-26 DIAGNOSIS — N3001 Acute cystitis with hematuria: Secondary | ICD-10-CM | POA: Diagnosis not present

## 2019-05-26 DIAGNOSIS — R7881 Bacteremia: Secondary | ICD-10-CM | POA: Diagnosis not present

## 2019-05-26 DIAGNOSIS — E1122 Type 2 diabetes mellitus with diabetic chronic kidney disease: Secondary | ICD-10-CM | POA: Diagnosis not present

## 2019-05-26 DIAGNOSIS — F05 Delirium due to known physiological condition: Secondary | ICD-10-CM | POA: Diagnosis not present

## 2019-05-26 DIAGNOSIS — I152 Hypertension secondary to endocrine disorders: Secondary | ICD-10-CM | POA: Diagnosis present

## 2019-05-26 DIAGNOSIS — E1140 Type 2 diabetes mellitus with diabetic neuropathy, unspecified: Secondary | ICD-10-CM | POA: Diagnosis not present

## 2019-05-26 DIAGNOSIS — I428 Other cardiomyopathies: Secondary | ICD-10-CM | POA: Diagnosis present

## 2019-05-26 DIAGNOSIS — R569 Unspecified convulsions: Secondary | ICD-10-CM | POA: Diagnosis not present

## 2019-05-26 DIAGNOSIS — I129 Hypertensive chronic kidney disease with stage 1 through stage 4 chronic kidney disease, or unspecified chronic kidney disease: Secondary | ICD-10-CM | POA: Diagnosis not present

## 2019-05-26 DIAGNOSIS — I4892 Unspecified atrial flutter: Secondary | ICD-10-CM | POA: Diagnosis present

## 2019-05-26 LAB — CBC
HCT: 30.3 % — ABNORMAL LOW (ref 39.0–52.0)
Hemoglobin: 9.6 g/dL — ABNORMAL LOW (ref 13.0–17.0)
MCH: 28.2 pg (ref 26.0–34.0)
MCHC: 31.7 g/dL (ref 30.0–36.0)
MCV: 88.9 fL (ref 80.0–100.0)
Platelets: 82 10*3/uL — ABNORMAL LOW (ref 150–400)
RBC: 3.41 MIL/uL — ABNORMAL LOW (ref 4.22–5.81)
RDW: 17.2 % — ABNORMAL HIGH (ref 11.5–15.5)
WBC: 9.2 10*3/uL (ref 4.0–10.5)
nRBC: 0 % (ref 0.0–0.2)

## 2019-05-26 LAB — URINALYSIS, ROUTINE W REFLEX MICROSCOPIC
Bilirubin Urine: NEGATIVE
Glucose, UA: NEGATIVE mg/dL
Ketones, ur: NEGATIVE mg/dL
Nitrite: NEGATIVE
Protein, ur: 30 mg/dL — AB
Specific Gravity, Urine: 1.015 (ref 1.005–1.030)
WBC, UA: >50 WBC/hpf — ABNORMAL HIGH (ref 0–5)
pH: 5 (ref 5.0–8.0)

## 2019-05-26 LAB — BASIC METABOLIC PANEL
Anion gap: 9 (ref 5–15)
BUN: 18 mg/dL (ref 8–23)
CO2: 20 mmol/L — ABNORMAL LOW (ref 22–32)
Calcium: 7.8 mg/dL — ABNORMAL LOW (ref 8.9–10.3)
Chloride: 106 mmol/L (ref 98–111)
Creatinine, Ser: 1.59 mg/dL — ABNORMAL HIGH (ref 0.61–1.24)
GFR calc Af Amer: 50 mL/min — ABNORMAL LOW (ref >60)
GFR calc non Af Amer: 43 mL/min — ABNORMAL LOW (ref >60)
Glucose, Bld: 107 mg/dL — ABNORMAL HIGH (ref 70–99)
Potassium: 3.7 mmol/L (ref 3.5–5.1)
Sodium: 135 mmol/L (ref 135–145)

## 2019-05-26 LAB — GLUCOSE, CAPILLARY
Glucose-Capillary: 95 mg/dL (ref 70–99)
Glucose-Capillary: 101 mg/dL — ABNORMAL HIGH (ref 70–99)
Glucose-Capillary: 99 mg/dL (ref 70–99)

## 2019-05-26 LAB — AMMONIA: Ammonia: 28 umol/L (ref 9–35)

## 2019-05-26 LAB — APTT: aPTT: 45 seconds — ABNORMAL HIGH (ref 24–36)

## 2019-05-26 LAB — PROTIME-INR
INR: 3.2 — ABNORMAL HIGH (ref 0.8–1.2)
Prothrombin Time: 32 seconds — ABNORMAL HIGH (ref 11.4–15.2)

## 2019-05-26 MED ORDER — CITALOPRAM HYDROBROMIDE 40 MG PO TABS
40.0000 mg | ORAL_TABLET | Freq: Every day | ORAL | Status: DC
  Administered 2019-05-26 – 2019-06-01 (×7): 40 mg via ORAL
  Filled 2019-05-26 (×7): qty 1

## 2019-05-26 MED ORDER — SODIUM CHLORIDE 0.9 % IV SOLN
1.0000 g | INTRAVENOUS | Status: DC
  Administered 2019-05-26: 1 g via INTRAVENOUS
  Filled 2019-05-26: qty 1

## 2019-05-26 NOTE — Progress Notes (Signed)
New Admission Note:  Arrival Method: [place] Mental Orientation: Alert and oriented x 4 /Slurred speech Telemetry: BOx 04 NSR Assessment: Completed Skin: Warm and dry IV: NSLs Pain: Denies Tubes: N/A Safety Measures: Safety Fall Prevention Plan initiated.  Admission: Completed 5 M  Orientation: Patient has been orientated to the room, unit and the staff. Welcome booklet given.  Family: Present  Orders have been reviewed and implemented. Will continue to monitor the patient. Call light has been placed within reach and bed alarm has been activated.   [place]ilford Shi BSN, RN  Phone Number: [phone]

## 2019-05-26 NOTE — Progress Notes (Signed)
FPTS Interim Progress Note Spoke with daughter at bedside with update to discontinue abx and watch overnight. We think urology and daily in and out caths will be beneficial moving forward. She does not want neuro work up right now she feels as if he has returned to his normal. Patient and daughter voiced understanding.   Lavonda Jumbo, DO 05/26/2019, 2:16 PM PGY-1, [facility] Family Medicine Service pager [phone]

## 2019-05-26 NOTE — Telephone Encounter (Signed)
They don't have the schedule 6 months out. Tonyetta Berko Bruna Potter, CMA

## 2019-05-26 NOTE — Progress Notes (Signed)
Occupational Therapy Evaluation Patient Details Name: Brian Burns MRN: [id number] DOB: 1945/09/18 Today's Date: 05/26/2019    History of Present Illness Pt is a 74 y/o male admitted secondary to sepsis and seizure like activity. Thought to be secondary to UTI. PMH includes BPH, HTN, a fib, DM, and chronic pancreatitis.    Clinical Impression   PTA, pt lives with spouse at home and was Modified Independent with ADLs and mobility. Pt used cane and Rollator for mobility, but family reports he has not been steady recently with cane. Pt Min A at most for bed mobility today with some assistance needed to advance trunk. Pt min guard for sit to stand with elevated bed and RW. Pt min guard to Min A for hallway distance mobility, requiring occasional assistance to maneuver RW and navigate obstacles. Pt also required cues to pick up feet when walking after becoming fatigued. Pt min guard for LB dressing to don socks sitting EOB. Pt's family currently interested in short term rehab as they are unable to provide physical assist for pt. Pt also has 4 steps to navigate in the home. Recommend SNF at this time, but with continued skilled acute therapy services, pt could progress to home w/ HHOT.     Follow Up Recommendations  SNF;Supervision/Assistance - 24 hour(could progress to home )    Equipment Recommendations  None recommended by OT    Recommendations for Other Services       Precautions / Restrictions Precautions Precautions: Fall Restrictions Weight Bearing Restrictions: No      Mobility Bed Mobility Overal bed mobility: Needs Assistance Bed Mobility: Supine to Sit;Sit to Supine     Supine to sit: Min assist Sit to supine: Min guard   General bed mobility comments: Min A for trunk advancement with flat bed and use of bed rails. min guard for return of B LEs to bed safely   Transfers Overall transfer level: Needs assistance Equipment used: Rolling walker (2 wheeled) Transfers: Sit  to/from [employer] Sit to Stand: Min guard Stand pivot transfers: Min guard       General transfer comment: min guard for sit to stand from elevated bed and stand pivot back to bed after mobility     Balance Overall balance assessment: Needs assistance Sitting-balance support: No upper extremity supported;Feet supported Sitting balance-Leahy Scale: Fair     Standing balance support: Bilateral upper extremity supported;During functional activity Standing balance-Leahy Scale: Fair                             ADL either performed or assessed with clinical judgement   ADL Overall ADL's : Needs assistance/impaired Eating/Feeding: Independent;Sitting   Grooming: Min guard;Standing   Upper Body Bathing: Supervision/ safety;Sitting   Lower Body Bathing: Sit to/from stand;Sitting/lateral leans;Min guard   Upper Body Dressing : Supervision/safety;Sitting   Lower Body Dressing: Min guard;Sitting/lateral leans;Sit to/from stand Lower Body Dressing Details (indicate cue type and reason): min guard and increased time to don socks sitting EOB Toilet Transfer: Minimal assistance;Ambulation;RW Toilet Transfer Details (indicate cue type and reason): Min A for navigating RW in small spaces Toileting- Clothing Manipulation and Hygiene: Min guard;Sitting/lateral lean;Sit to/from stand       Functional mobility during ADLs: Minimal assistance;Min guard;Rolling walker General ADL Comments: Overall min guard to Min A for ADLs and mobility assessed with RW. Pt with deficits in endurance and overall strength, as he fatigues quickly, but motivated to return home  Vision Baseline Vision/History: No visual deficits       Perception     Praxis      Pertinent Vitals/Pain Pain Assessment: No/denies pain     Hand Dominance Right   Extremity/Trunk Assessment Upper Extremity Assessment Upper Extremity Assessment: Overall WFL for tasks assessed   Lower  Extremity Assessment Lower Extremity Assessment: Defer to PT evaluation       Communication Communication Communication: No difficulties   Cognition Arousal/Alertness: Awake/alert Behavior During Therapy: WFL for tasks assessed/performed Overall Cognitive Status: Impaired/Different from baseline Area of Impairment: Problem solving;Safety/judgement                         Safety/Judgement: Decreased awareness of deficits   Problem Solving: Slow processing General Comments: Pt oriented to time, place, and person. Increased time required for sequencing.    General Comments  BP at rest 149/91, 139/77 sitting EOB. No reports of dizziness, HR low 50s at rest, 68-70 after activity     Exercises     Shoulder Instructions      Home Living Family/patient expects to be discharged to:: Private residence Living Arrangements: Spouse/significant other Available Help at Discharge: Family;Available 24 hours/day Type of Home: House Home Access: Stairs to enter [employer] of Steps: 4 Entrance Stairs-Rails: Right;Left;Can reach both Home Layout: One level     Bathroom Shower/Tub: Teacher, early years/pre: Standard     Home Equipment: Tub bench;Walker - 4 wheels;Cane - single point   Additional Comments: Wife unable to physically assist.       Prior Functioning/Environment Level of Independence: Independent with assistive device(s)        Comments: Used cane and Rollator for mobility in the home, reports increasing difficulty with cane         OT Problem List: Decreased activity tolerance;Impaired balance (sitting and/or standing);Decreased coordination;Decreased cognition;Decreased knowledge of use of DME or AE      OT Treatment/Interventions: Self-care/ADL training;Therapeutic exercise;Energy conservation;DME and/or AE instruction;Therapeutic activities;Patient/family education    OT Goals(Current goals can be found in the care plan section)  Acute Rehab OT Goals Patient Stated Goal: get rehab if needed and go home  OT Goal Formulation: With patient/family Time For Goal Achievement: 06/09/19 Potential to Achieve Goals: Good ADL Goals Pt Will Perform Grooming: with modified independence;standing Pt Will Perform Lower Body Bathing: with set-up;sit to/from stand Pt Will Perform Lower Body Dressing: with set-up;sitting/lateral leans;sit to/from stand Pt Will Transfer to Toilet: with supervision;ambulating Pt Will Perform Toileting - Clothing Manipulation and hygiene: with modified independence;sit to/from stand;sitting/lateral leans Additional ADL Goal #1: Pt to demonstrate ability to complete daily tasks for 10 minutes without rest breaks in order to maximize endurance and safe completion of ADLs/mobility.  OT Frequency: Min 2X/week   Barriers to D/C:            Co-evaluation              AM-PAC OT "6 Clicks" Daily Activity     Outcome Measure Help from another person eating meals?: None Help from another person taking care of personal grooming?: A Little Help from another person toileting, which includes using toliet, bedpan, or urinal?: A Little Help from another person bathing (including washing, rinsing, drying)?: A Little Help from another person to put on and taking off regular upper body clothing?: A Little Help from another person to put on and taking off regular lower body clothing?: A Little 6 Click Score: 19  End of Session Equipment Utilized During Treatment: Gait belt;Rolling walker Nurse Communication: Mobility status  Activity Tolerance: Patient tolerated treatment well Patient left: in bed;with call bell/phone within reach;with family/visitor present;with bed alarm set  OT Visit Diagnosis: Unsteadiness on feet (R26.81);Other abnormalities of gait and mobility (R26.89)                Time: 1898-4210 OT Time Calculation (min): 29 min Charges:  OT General Charges $OT Visit: 1 Visit OT  Evaluation $OT Eval Moderate Complexity: 1 Mod OT Treatments $Therapeutic Activity: 8-22 mins  Lorre Munroe, OTR/L Lorre Munroe 05/26/2019, 2:03 PM

## 2019-05-26 NOTE — Progress Notes (Signed)
PHARMACY - PHYSICIAN COMMUNICATION CRITICAL VALUE ALERT - BLOOD CULTURE IDENTIFICATION (BCID)  HASSAN BLACKSHIRE is an 74 y.o. male who presented to [facility] on 05/25/2019 with a chief complaint of fever and vomiting  Assessment:  1/4 blood cultures positive for Gram Positive rods (aerobic bottle); unclear source  Name of physician (or Provider) Contacted: Dr. Clent Ridges  Current antibiotics: None  Changes to prescribed antibiotics recommended:  Restart Ceftriaxone Recommendations accepted by provider  Tera Mater, PharmD 05/26/2019  8:38 PM

## 2019-05-26 NOTE — Progress Notes (Addendum)
Family Medicine Teaching Service Daily Progress Note Intern Pager: [phone]  Patient name: Brian Burns Medical record number: [id number] Date of birth: 06-12-1945 Age: 74 y.o. Gender: male  Primary Care Provider: Danna Hefty, DO Consultants: None Code Status: FULL  Pt Overview and Major Events to Date:  4/27 Admitted  Assessment and Plan: Brian Burns is a 74 y.o. male presenting with fever and vomiting. PMH is significant for multiple UTIs, hypertension, CVA, paroxysmal A. fib on Xarelto, hyperlipidemia, anemia, BPH, urethral strictures  AMS, Metabolic encephalopathy  Presented w/ 3 episodes of vomiting, dysuria, and shaking earlier tonight. Hx of UTI w/ BPH and urethral strictures. Met 3/4 sepsis criteria (no leukocytosis)  w/ UA that showed LE, hgb, proteinuria and a lactic acid initially >11. LA 11.0>5.1>3.7>4.1. Ammonia 192. No signs/symptoms/labs to indicate a source of infection. Ur cx NGR. Bld cx NGR thus far. Will stop abx and monitor overnight. Will need outpatient urology follow up.  - Discontinue CTX  -Monitor for status change overnight -PVR - tylenol PRN for fever/pain  Seizure like activity Occurred after EMS arrived. Description appears consistent with generalized clonic-tonic seizure lasting < 1 minute. Self resolving. No further episodes. No hx of seizures. Discussed with daughter who is his decision maker if they would like neurology consulted to further work up but it seems like both instances are secondary to severe illness/sepsis and she would not like to pursue at this time.   -Monitor for status change overnight.  - neuro checks q4h.  - avoid epileptogenic medications  BPH with urethral stricture On finasteride and flomax at home.  Sees urology who recently recommended pt in and out cath but pt declined this. Has had dilation, urethrotomy, and urethroplasty in the past.  Has a hx of UTI likely from retention of urine 2/2 strictures and bph. UOP  0.6L  - continue finasteride - continue flomax -outpatient follow up due to the above  Hypertension  BP 110/76. Has been told to reduce lisinopril to 73m from 184md/t side effects of dizziness but was still taking 1078mefore admission.  - hold lisinopril.  - if restarting, restart at 5mg30m Proxysmal A Fib In sinus tach on ekg today. Is on xarelto at home but does not appear to be on rate/rhythm control. Pt has 4 ekg's since 2018 that show regular rhythm. Some show bradycardia, which may be why he is not on rate control medications.  - continue home xarelto  HLD Last lipid panel 2017 with LDL of 90.  - continue atorvostatin 40mg58m/u lipid panel  Anemia Hgb 11.5>9.6. Consistently < 12 g/dl going back to 2015. Currently 12.6 on this admission. Normocytic.  - am cbc - holding home iron in setting of sepsis  DM2  CBG 95. A1c 6.7.  Does not appear to be on antiglycemic medications at home.   - sSSI  Hx of chronic pancreatitis/FTT During an admission in 2017 for failure to thrive patient was started on Creon.  Has been continued on this since that time.  Appears well tolerated.   - continue creon.   Depression  On celexa 40mg 38mome.  Not currently endorsing depression.  - continue home celexa.   FEN/GI:  -Heart Healthy/Average Carb -IVF normal saline 100mL  58mhylaxis:  -Xarelto  Disposition: Pending further medical work up   Subjective:  No concerns at this time. Daughter feels as if he has returned to baseline  Objective: Temp:  [98 F (36.7 C)-98.8 F (37.1  C)] 98.8 F (37.1 C) (04/28 0526) Pulse Rate:  [49-66] 66 (04/28 0526) Resp:  [16-21] 18 (04/28 0526) BP: (110-151)/(62-77) 110/76 (04/28 0526) SpO2:  [96 %-100 %] 96 % (04/28 0526) Weight:  [100.7 kg] 100.7 kg (04/27 1854)  Physical Exam:  General: Appears well, no acute distress. Age appropriate. Cardiac: RRR, normal heart sounds, no murmurs Respiratory: CTAB, normal  effort Extremities: No edema or cyanosis. Skin: Warm and dry, no rashes noted Neuro: alert and oriented x4 Psych: normal affect  Laboratory: Recent Labs  Lab 05/25/19 0115 05/25/19 0124 05/26/19 0422  WBC 7.0  --  9.2  HGB 11.5* 12.6* 9.6*  HCT 37.6* 37.0* 30.3*  PLT 128*  --  82*   Recent Labs  Lab 05/25/19 0115 05/25/19 0124 05/25/19 0310 05/25/19 1133 05/26/19 0422  NA 130*   < > 135 136 135  K 3.7   < > 3.5 3.7 3.7  CL 99   < > 104 103 106  CO2 10*   < > 19* 23 20*  BUN 19   < > '19 18 18  ' CREATININE 2.15*   < > 1.97* 1.89* 1.59*  CALCIUM 8.7*   < > 8.1* 8.5* 7.8*  PROT 6.9  --   --   --   --   BILITOT 0.6  --   --   --   --   ALKPHOS 109  --   --   --   --   ALT 20  --   --   --   --   AST 28  --   --   --   --   GLUCOSE 188*   < > 139* 125* 107*   < > = values in this interval not displayed.    Imaging/Diagnostic Tests: No new imaging  Gerlene Fee, DO 05/26/2019, 7:43 AM PGY-1, [place] Intern pager: [phone], text pages welcome

## 2019-05-27 DIAGNOSIS — R569 Unspecified convulsions: Secondary | ICD-10-CM

## 2019-05-27 DIAGNOSIS — G9340 Encephalopathy, unspecified: Secondary | ICD-10-CM | POA: Diagnosis not present

## 2019-05-27 DIAGNOSIS — N179 Acute kidney failure, unspecified: Secondary | ICD-10-CM | POA: Diagnosis not present

## 2019-05-27 DIAGNOSIS — N3001 Acute cystitis with hematuria: Secondary | ICD-10-CM | POA: Diagnosis not present

## 2019-05-27 LAB — CBC
HCT: 31.6 % — ABNORMAL LOW (ref 39.0–52.0)
Hemoglobin: 10.2 g/dL — ABNORMAL LOW (ref 13.0–17.0)
MCH: 28.7 pg (ref 26.0–34.0)
MCHC: 32.3 g/dL (ref 30.0–36.0)
MCV: 89 fL (ref 80.0–100.0)
Platelets: 85 10*3/uL — ABNORMAL LOW (ref 150–400)
RBC: 3.55 MIL/uL — ABNORMAL LOW (ref 4.22–5.81)
RDW: 17.2 % — ABNORMAL HIGH (ref 11.5–15.5)
WBC: 8.4 10*3/uL (ref 4.0–10.5)
nRBC: 0 % (ref 0.0–0.2)

## 2019-05-27 LAB — BASIC METABOLIC PANEL
Anion gap: 6 (ref 5–15)
BUN: 13 mg/dL (ref 8–23)
CO2: 23 mmol/L (ref 22–32)
Calcium: 8 mg/dL — ABNORMAL LOW (ref 8.9–10.3)
Chloride: 106 mmol/L (ref 98–111)
Creatinine, Ser: 1.34 mg/dL — ABNORMAL HIGH (ref 0.61–1.24)
GFR calc Af Amer: >60 mL/min (ref >60)
GFR calc non Af Amer: 53 mL/min — ABNORMAL LOW (ref >60)
Glucose, Bld: 98 mg/dL (ref 70–99)
Potassium: 4.2 mmol/L (ref 3.5–5.1)
Sodium: 135 mmol/L (ref 135–145)

## 2019-05-27 LAB — GLUCOSE, CAPILLARY
Glucose-Capillary: 78 mg/dL (ref 70–99)
Glucose-Capillary: 106 mg/dL — ABNORMAL HIGH (ref 70–99)
Glucose-Capillary: 101 mg/dL — ABNORMAL HIGH (ref 70–99)
Glucose-Capillary: 102 mg/dL — ABNORMAL HIGH (ref 70–99)

## 2019-05-27 MED ORDER — HYDRALAZINE HCL 10 MG PO TABS
10.0000 mg | ORAL_TABLET | Freq: Once | ORAL | Status: AC
  Administered 2019-05-27: 10 mg via ORAL
  Filled 2019-05-27: qty 1

## 2019-05-27 MED ORDER — SODIUM CHLORIDE 0.9 % IV SOLN
3.0000 g | Freq: Four times a day (QID) | INTRAVENOUS | Status: DC
  Administered 2019-05-27 – 2019-05-31 (×17): 3 g via INTRAVENOUS
  Filled 2019-05-27 (×9): qty 3
  Filled 2019-05-27: qty 8
  Filled 2019-05-27 (×7): qty 3
  Filled 2019-05-27 (×2): qty 8
  Filled 2019-05-27: qty 3

## 2019-05-27 MED ORDER — MELATONIN 3 MG PO TABS
3.0000 mg | ORAL_TABLET | Freq: Every day | ORAL | Status: DC
  Administered 2019-05-27 – 2019-06-01 (×6): 3 mg via ORAL
  Filled 2019-05-27 (×6): qty 1

## 2019-05-27 MED ORDER — SODIUM CHLORIDE 0.9 % IV SOLN: 2.0000 g | INTRAVENOUS | Status: DC

## 2019-05-27 NOTE — TOC Initial Note (Addendum)
Transition of Care [facility]) - Initial/Assessment Note    Patient Details  Name: Brian Burns MRN: [id number] Date of Birth: [birth year]  [facility]) CM/SW Contact:    Arvella Merles, [place] Phone Number: 05/27/2019, 11:02 AM  Clinical Narrative:                 CSW received consult for possible SNF placement at time of discharge. CSW met with patient and patient's daughter Brian Burns bedside.  Patient is from home with is wife who does not drive. Patient has 4 steps in the home. Patient and daughter expressed understanding of PT recommendation for short-term stay at a SNF placement and is in agreement. Preference for [facility] or [place]. CSW discussed insurance authorization process and provided information on where to find Medicare SNF ratings. Daughter is best point of contact for patient's care. No further questions at this time, CSW will continue to follow.     Expected Discharge Plan: Skilled Nursing Facility Barriers to Discharge: Ship broker, Continued Medical Work up   Patient Goals and CMS Choice   CMS Medicare.gov Compare Post Acute Care list provided to:: Patient Represenative (must comment)(Cathy, daughter) Choice offered to / list presented to : Adult Children  Expected Discharge Plan and Services Expected Discharge Plan: [place] In-house Referral: Clinical Social Work     Living arrangements for the past 2 months: Single Family Home                                      Prior Living Arrangements/Services Living arrangements for the past 2 months: Single Family Home Lives with:: Spouse Patient language and need for interpreter reviewed:: Yes Do you feel safe going back to the place where you live?: Yes        Care giver support system in place?: Yes (comment)   Criminal Activity/Legal Involvement Pertinent to Current Situation/Hospitalization: No - Comment as needed  Activities of Daily Living Home Assistive  Devices/Equipment: Cane (specify quad or straight) ADL Screening (condition at time of admission) Patient's cognitive ability adequate to safely complete daily activities?: Yes Is the patient deaf or have difficulty hearing?: No Does the patient have difficulty seeing, even when wearing glasses/contacts?: No Does the patient have difficulty concentrating, remembering, or making decisions?: No Patient able to express need for assistance with ADLs?: Yes Does the patient have difficulty dressing or bathing?: No Independently performs ADLs?: Yes (appropriate for developmental age) Does the patient have difficulty walking or climbing stairs?: No Weakness of Legs: None Weakness of Arms/Hands: None  Permission Sought/Granted Permission sought to share information with : Facility Sport and exercise psychologist, Family Supports Permission granted to share information with : Yes, Verbal Permission Granted  Share Information with NAME: Brian Burns  Permission granted to share info w AGENCY: SNFs  Permission granted to share info w Relationship: Daughter  Permission granted to share info w Contact Information: [phone]  Emotional Assessment   Attitude/Demeanor/Rapport: Unable to Assess Affect (typically observed): Calm Orientation: : Oriented to Self, Oriented to Place, Oriented to  Time, Oriented to Situation Alcohol / Substance Use: Not Applicable Psych Involvement: No (comment)  Admission diagnosis:  Dehydration [E86.0] Seizure (HCC) [R56.9] Encephalopathy [G93.40] Acute cystitis with hematuria [N30.01] AKI (acute kidney injury) ([place]) [N17.9] Sepsis ([place]) [A41.9] Patient Active Problem List   Diagnosis Date Noted  . Sepsis ([place]) 05/25/2019  . Acute cystitis with hematuria   . Urge  incontinence 12/08/2018  . Stenosis of lateral recess of lumbar spine 12/08/2018  . Dementia, vascular ([place]) 12/08/2018  . Morbid obesity ([place]) 12/03/2018  . Decreased visual acuity of left eye  12/02/2018  . Gross  hematuria 10/29/2018  . Other abnormalities of gait and mobility 09/02/2018  . Muscle weakness (generalized) 04/08/2018  . Lumbar spinal stenosis 04/05/2018  . Chronic pain of right knee 03/07/2017  . Chronic low back pain without sciatica 03/07/2017  . Nonischemic cardiomyopathy ([place]) 09/25/2016  . Microalbuminuria 07/26/2015  . Paroxysmal atrial fibrillation (HCC)   . Coronary artery disease involving native coronary artery of native heart without angina pectoris   . Stricture, urethra   . CKD stage 3 due to type 2 diabetes mellitus ([place]) 03/16/2015  . Depression 03/16/2015  . Normocytic anemia   . History of Multiple lacunar infarcts ([place]) 01/31/2015  . GERD (gastroesophageal reflux disease) 10/21/2013  . AAA (abdominal aortic aneurysm) without rupture ([place]) 06/24/2013  . PVD (peripheral vascular disease) ([place]) 06/24/2013  . Orthostatic dizziness 03/18/2013  . Hearing loss of both ears 03/18/2013  . BPH (benign prostatic hyperplasia) 02/16/2013  . Type 2 diabetes mellitus with diabetic neuropathy, unspecified ([place]) 05/10/2012  . Type 2 diabetes mellitus with hypercholesterolemia ([place]) 02/05/2007  . Hypertension associated with diabetes ([place]) 03/27/2006   PCP:  Danna Hefty, DO Pharmacy:   [facility] [place], [place] - [address] [place] [address] Phone: [phone] Fax: [phone]  [id number] - [address], [place] - [address] AT [address] [place] [address] Phone: [phone] Fax: [phone]     Social Determinants of Health (SDOH) Interventions    Readmission Risk Interventions No flowsheet data found.

## 2019-05-27 NOTE — Progress Notes (Signed)
Occupational Therapy Treatment Patient Details Name: Brian Burns MRN: [id number] DOB: Jun 24, 1945 Today's Date: 05/27/2019    History of present illness Pt is a 74 y/o male admitted secondary to sepsis and seizure like activity. Thought to be secondary to UTI. PMH includes BPH, HTN, a fib, DM, and chronic pancreatitis.    OT comments  Pt making steady progress towards OT goals this session. Session focus on functional mobility as precursor to higher level ADLs. Pt continues to present with cognitive deficits, generalized weakness and decreased activity tolerance. Overall, pt requires MIN- MOD A for sit<>stand and MIN A for functional mobility with RW needing cues for RW mgmt and directional cues,  total A for posterior pericare in standing. DC plan currently remains appropriate, will follow acutely per POC.    Follow Up Recommendations  SNF;Supervision/Assistance - 24 hour;Other (comment)(could progress to home)    Equipment Recommendations  None recommended by OT    Recommendations for Other Services      Precautions / Restrictions Precautions Precautions: Fall Restrictions Weight Bearing Restrictions: No       Mobility Bed Mobility Overal bed mobility: Needs Assistance Bed Mobility: Supine to Sit     Supine to sit: Min assist;HOB elevated     General bed mobility comments: light MIN A to scoot hips to EOB  Transfers Overall transfer level: Needs assistance Equipment used: Rolling walker (2 wheeled) Transfers: Sit to/from Stand Sit to Stand: Min assist;Mod assist;From elevated surface         General transfer comment: pt initially requried MOD A to power into standing needing cues for hand placement however progressed to MIN A    Balance Overall balance assessment: Needs assistance Sitting-balance support: No upper extremity supported;Feet supported Sitting balance-Leahy Scale: Fair     Standing balance support: Bilateral upper extremity supported;During  functional activity Standing balance-Leahy Scale: Fair Standing balance comment: Reliant on at least 1 UE support                            ADL either performed or assessed with clinical judgement   ADL Overall ADL's : Needs assistance/impaired                 Upper Body Dressing : Minimal assistance;Sitting Upper Body Dressing Details (indicate cue type and reason): to don new hospital gown Lower Body Dressing: Min guard;Sitting/lateral leans Lower Body Dressing Details (indicate cue type and reason): to pull up socks from EOB Toilet Transfer: Minimal assistance;Ambulation;RW Toilet Transfer Details (indicate cue type and reason): simulated via functional mobility; MIN A for balance, directional cues and RW mgmt Toileting- Clothing Manipulation and Hygiene: Total assistance;Sit to/from stand Toileting - Clothing Manipulation Details (indicate cue type and reason): total A for posterior pericare post incontinent BM     Functional mobility during ADLs: Minimal assistance;Rolling walker General ADL Comments: pt continues to present with cognitive impairments, generalized weakness and decreased activity tolerance limiting pts ability to engage in ADLs. Session focus on functional mobility as precursor to higher level ADLs     Vision       Perception     Praxis      Cognition Arousal/Alertness: Awake/alert Behavior During Therapy: WFL for tasks assessed/performed Overall Cognitive Status: Impaired/Different from baseline Area of Impairment: Problem solving;Safety/judgement                         Safety/Judgement: Decreased awareness of deficits  Problem Solving: Slow processing General Comments: pt initially states is 2001 but able to self correct with increased time. pt requires directional cues unabel to initially determine R vs L without tactile cues        Exercises     Shoulder Instructions       General Comments VSS with no reports  of dizziness    Pertinent Vitals/ Pain       Pain Assessment: No/denies pain  Home Living                                          Prior Functioning/Environment              Frequency  Min 2X/week        Progress Toward Goals  OT Goals(current goals can now be found in the care plan section)  Progress towards OT goals: Progressing toward goals  Acute Rehab OT Goals Patient Stated Goal: get rehab if needed and go home  OT Goal Formulation: With patient/family Time For Goal Achievement: 06/09/19 Potential to Achieve Goals: Good  Plan Discharge plan remains appropriate    Co-evaluation                 AM-PAC OT "6 Clicks" Daily Activity     Outcome Measure   Help from another person eating meals?: None Help from another person taking care of personal grooming?: A Little Help from another person toileting, which includes using toliet, bedpan, or urinal?: A Little Help from another person bathing (including washing, rinsing, drying)?: A Little Help from another person to put on and taking off regular upper body clothing?: A Little Help from another person to put on and taking off regular lower body clothing?: A Little 6 Click Score: 19    End of Session Equipment Utilized During Treatment: Gait belt;Rolling walker  OT Visit Diagnosis: Unsteadiness on feet (R26.81);Other abnormalities of gait and mobility (R26.89)   Activity Tolerance Patient tolerated treatment well   Patient Left in chair;with call bell/phone within reach;with chair alarm set;with family/visitor present   Nurse Communication          Time: 2482-5003 OT Time Calculation (min): 19 min  Charges: OT General Charges $OT Visit: 1 Visit OT Treatments $Self Care/Home Management : 8-22 mins  Audery Amel., COTA/L Acute Rehabilitation Services [phone]    Angelina Pih 05/27/2019, 11:26 AM

## 2019-05-27 NOTE — NC FL2 (Signed)
[place] MEDICAID FL2 LEVEL OF CARE SCREENING TOOL     IDENTIFICATION  Patient Name: MARCIO Burns Birthdate: [birth year] Sex: male Admission Date (Current Location): 05/25/2019  [facility] and [place] Number:  Producer, television/film/video and Address:  [facility], [address], [place], [place] [address]      Provider Number: [id number]  Attending Physician Name and Address:  Nestor Ramp, MD  Relative Name and Phone Number:       Current Level of Care: Hospital Recommended Level of Care: Skilled Nursing Facility Prior Approval Number:    Date Approved/Denied:   PASRR Number: [id number]  Discharge Plan: SNF    Current Diagnoses: Patient Active Problem List   Diagnosis Date Noted  . Sepsis (HCC) 05/25/2019  . Acute cystitis with hematuria   . Urge incontinence 12/08/2018  . Stenosis of lateral recess of lumbar spine 12/08/2018  . Dementia, vascular (HCC) 12/08/2018  . Morbid obesity (HCC) 12/03/2018  . Decreased visual acuity of left eye  12/02/2018  . Gross hematuria 10/29/2018  . Other abnormalities of gait and mobility 09/02/2018  . Muscle weakness (generalized) 04/08/2018  . Lumbar spinal stenosis 04/05/2018  . Chronic pain of right knee 03/07/2017  . Chronic low back pain without sciatica 03/07/2017  . Nonischemic cardiomyopathy (HCC) 09/25/2016  . Microalbuminuria 07/26/2015  . Paroxysmal atrial fibrillation (HCC)   . Coronary artery disease involving native coronary artery of native heart without angina pectoris   . Stricture, urethra   . CKD stage 3 due to type 2 diabetes mellitus (HCC) 03/16/2015  . Depression 03/16/2015  . Normocytic anemia   . History of Multiple lacunar infarcts (HCC) 01/31/2015  . GERD (gastroesophageal reflux disease) 10/21/2013  . AAA (abdominal aortic aneurysm) without rupture (HCC) 06/24/2013  . PVD (peripheral vascular disease) (HCC) 06/24/2013  . Orthostatic dizziness 03/18/2013  . Hearing loss of both  ears 03/18/2013  . BPH (benign prostatic hyperplasia) 02/16/2013  . Type 2 diabetes mellitus with diabetic neuropathy, unspecified (HCC) 05/10/2012  . Type 2 diabetes mellitus with hypercholesterolemia (HCC) 02/05/2007  . Hypertension associated with diabetes (HCC) 03/27/2006    Orientation RESPIRATION BLADDER Height & Weight     Self, Time, Situation, Place  Normal Incontinent, External catheter Weight: 222 lb 0.1 oz (100.7 kg) Height:  6\' 4"  (193 cm)  BEHAVIORAL SYMPTOMS/MOOD NEUROLOGICAL BOWEL NUTRITION STATUS      Incontinent Diet(See discharge summary)  AMBULATORY STATUS COMMUNICATION OF NEEDS Skin   Limited Assist Verbally Normal                       Personal Care Assistance Level of Assistance  Bathing, Dressing, Feeding Bathing Assistance: Limited assistance Feeding assistance: Independent Dressing Assistance: Limited assistance     Functional Limitations Info  Sight, Speech, Hearing Sight Info: Adequate Hearing Info: Adequate Speech Info: Impaired(Slurred)    SPECIAL CARE FACTORS FREQUENCY  PT (By licensed PT), OT (By licensed OT)     PT Frequency: 5x a week OT Frequency: 5x a week            Contractures Contractures Info: Not present    Additional Factors Info  Allergies, Code Status Code Status Info: Full Allergies Info: Gabapentin           Current Medications (05/27/2019):  This is the current hospital active medication list Current Facility-Administered Medications  Medication Dose Route Frequency Provider Last Rate Last Admin  . 0.9 %  sodium chloride infusion  Intravenous Continuous Carollee Leitz, MD 100 mL/hr at 05/27/19 0601 New Bag at 05/27/19 0601  . acetaminophen (TYLENOL) tablet 650 mg  650 mg Oral Q6H PRN Carollee Leitz, MD       Or  . acetaminophen (TYLENOL) suppository 650 mg  650 mg Rectal Q6H PRN Carollee Leitz, MD      . Ampicillin-Sulbactam (UNASYN) 3 g in sodium chloride 0.9 % 100 mL IVPB  3 g Intravenous Q6H Autry-Lott,  Simone, DO 200 mL/hr at 05/27/19 1108 3 g at 05/27/19 1108  . atorvastatin (LIPITOR) tablet 40 mg  40 mg Oral Daily Carollee Leitz, MD   40 mg at 05/27/19 1008  . citalopram (CELEXA) tablet 40 mg  40 mg Oral QHS Darrelyn Hillock N, DO   40 mg at 05/26/19 2133  . finasteride (PROSCAR) tablet 5 mg  5 mg Oral QHS Carollee Leitz, MD   5 mg at 05/26/19 2133  . insulin aspart (novoLOG) injection 0-9 Units  0-9 Units Subcutaneous TID WC Benay Pike, MD      . lipase/protease/amylase (CREON) capsule 12,000 Units  12,000 Units Oral BID WC Carollee Leitz, MD   12,000 Units at 05/27/19 0815  . rivaroxaban (XARELTO) tablet 20 mg  20 mg Oral QAC supper Carollee Leitz, MD   20 mg at 05/26/19 1808  . tamsulosin (FLOMAX) capsule 0.4 mg  0.4 mg Oral Daily Carollee Leitz, MD   0.4 mg at 05/27/19 1008     Discharge Medications: Please see discharge summary for a list of discharge medications.  Relevant Imaging Results:  Relevant Lab Results:   Additional Information SSN [id number]  Brian Burns [place], [place]

## 2019-05-27 NOTE — Progress Notes (Signed)
Physical Therapy Treatment Patient Details Name: Brian Burns MRN: [id number] DOB: 1945/07/15 Today's Date: 05/27/2019    History of Present Illness Pt is a 74 y/o male admitted secondary to sepsis and seizure like activity. Thought to be secondary to UTI. PMH includes BPH, HTN, a fib, DM, and chronic pancreatitis.     PT Comments    Pt demonstrating good progress today.  He was able to ambulate in hall but required min A for transfers, facilitation and cues for sequencing, and increased time.  Continues to have decreased safety and has stairs to enter home.  Cont POC.    Follow Up Recommendations  SNF;Supervision/Assistance - 24 hour     Equipment Recommendations  Rolling walker with 5" wheels    Recommendations for Other Services       Precautions / Restrictions Precautions Precautions: Fall    Mobility  Bed Mobility Overal bed mobility: Needs Assistance Bed Mobility: Supine to Sit;Rolling;Sit to Supine Rolling: Supervision   Supine to sit: Min assist;HOB elevated Sit to supine: Min assist;HOB elevated   General bed mobility comments: cues for sequencing and positioning once back in bed  Transfers Overall transfer level: Needs assistance Equipment used: Rolling walker (2 wheeled) Transfers: Sit to/from Stand Sit to Stand: From elevated surface;Min guard         General transfer comment: cues for hand placement ; min guard for safety  Ambulation/Gait Ambulation/Gait assistance: Min assist Gait Distance (Feet): 150 Feet Assistive device: Rolling walker (2 wheeled) Gait Pattern/deviations: Decreased stride length;Shuffle;Trunk flexed Gait velocity: decreased   General Gait Details: Cued for posture and increased stride length.  Required assist for RW in tight areas.   Stairs             Wheelchair Mobility    Modified Rankin (Stroke Patients Only)       Balance Overall balance assessment: Needs assistance Sitting-balance support: No upper  extremity supported;Feet supported Sitting balance-Leahy Scale: Good     Standing balance support: Bilateral upper extremity supported;During functional activity Standing balance-Leahy Scale: Fair Standing balance comment: Able to remove hands from RW to don mask                            Cognition Arousal/Alertness: Awake/alert Behavior During Therapy: WFL for tasks assessed/performed Overall Cognitive Status: Impaired/Different from baseline Area of Impairment: Problem solving;Safety/judgement                 Orientation Level: Disoriented to;Situation       Safety/Judgement: Decreased awareness of deficits   Problem Solving: Slow processing        Exercises      General Comments General comments (skin integrity, edema, etc.): VSS;  upon arrival pt had BM and was unaware and condom cath was off - assisted with cleaning and notified RN      Pertinent Vitals/Pain Pain Assessment: No/denies pain    Home Living                      Prior Function            PT Goals (current goals can now be found in the care plan section) Acute Rehab PT Goals Patient Stated Goal: get rehab if needed and go home  PT Goal Formulation: With patient/family Time For Goal Achievement: 06/08/19 Potential to Achieve Goals: Good Progress towards PT goals: Progressing toward goals    Frequency    Min  3X/week      PT Plan Current plan remains appropriate    Co-evaluation              AM-PAC PT "6 Clicks" Mobility   Outcome Measure  Help needed turning from your back to your side while in a flat bed without using bedrails?: A Little Help needed moving from lying on your back to sitting on the side of a flat bed without using bedrails?: A Little Help needed moving to and from a bed to a chair (including a wheelchair)?: A Little Help needed standing up from a chair using your arms (e.g., wheelchair or bedside chair)?: A Little Help needed to walk  in hospital room?: A Little Help needed climbing 3-5 steps with a railing? : A Lot 6 Click Score: 17    End of Session Equipment Utilized During Treatment: Gait belt Activity Tolerance: Patient tolerated treatment well Patient left: in bed;with call bell/phone within reach;with family/visitor present;with bed alarm set Nurse Communication: Mobility status PT Visit Diagnosis: Unsteadiness on feet (R26.81);Muscle weakness (generalized) (M62.81)     Time: 4665-9935 PT Time Calculation (min) (ACUTE ONLY): 23 min  Charges:  $Gait Training: 8-22 mins $Therapeutic Activity: 8-22 mins                     Royetta Asal, PT Acute Rehab Services Pager [phone] [facility] [phone] [facility] [phone]    Rayetta Humphrey 05/27/2019, 5:20 PM

## 2019-05-27 NOTE — Progress Notes (Addendum)
Family Medicine Teaching Service Daily Progress Note Intern Pager: [phone]  Patient name: Brian Burns Medical record number: [id number] Date of birth: Jun 17, 1945 Age: 74 y.o. Gender: male  Primary Care Provider: Danna Hefty, DO Consultants: None Code Status: FULL  Pt Overview and Major Events to Date:  4/27 Admitted  Assessment and Plan: Brian Burns is a 74 y.o. male presenting with fever and vomiting. PMH is significant for multiple UTIs, hypertension, CVA, paroxysmal A. fib on Xarelto, hyperlipidemia, anemia, BPH, urethral strictures  AMS, sepsis  Bacteremia Presented w/ 3 episodes of vomiting, dysuria, and shaking earlier tonight. Hx of UTI w/ BPH and urethral strictures. Met 3/4 sepsis criteria (no leukocytosis)  w/ UA that showed LE, hgb, proteinuria. Ur cx NGR. Bld cx w/ GPR. CTX continued overnight. Daughter reports patient appeared to be more confused this morning. He is oriented x4 during my exam.  -Start Unasyn -f/u sensitivities -repeat bld cx -Monitor for status change overnight -PVR  - tylenol PRN for fever/pain  Incontinence BPH urethral stricture as below. But new change with bowel incontinence. Has to episodes overnight. Previously continent. Likely due to acute illness. Will monitor for improvement. -q12 bladder scans -continue to monitor   Seizure like activity Occurred after EMS arrived. Description appears consistent with generalized clonic-tonic seizure lasting < 1 minute. Self resolving. No further episodes. No hx of seizures. Discussed with daughter who is his decision maker if they would like neurology consulted to further work up but it seems like both instances are secondary to severe illness/sepsis and she would not like to pursue at this time.   -Monitor for status change overnight.  - neuro checks q4h.  - avoid epileptogenic medications  BPH with urethral stricture On finasteride and flomax at home.  Sees urology who recently  recommended pt in and out cath but pt declined this. Has had dilation, urethrotomy, and urethroplasty in the past.  Has a hx of UTI likely from retention of urine 2/2 strictures and bph. UOP 0.6L  - continue finasteride - continue flomax -outpatient follow up due to the above  Hypertension  BP 167/83. Has been told to reduce lisinopril to 64m from 177md/t side effects of dizziness but was still taking 1097mefore admission.  - hold lisinopril.  - Consider restarting  Proxysmal A Fib In sinus tach on ekg today. Is on xarelto at home but does not appear to be on rate/rhythm control. Pt has 4 ekg's since 2018 that show regular rhythm. Some show bradycardia, which may be why he is not on rate control medications.  - continue home xarelto  HLD Last lipid panel 2017 with LDL of 90.  - continue atorvostatin 42m52mf/u lipid panel  Anemia Hgb 11.5>9.6>10.2. Consistently < 12 g/dl going back to 2015. Currently 12.6 on this admission. Normocytic.  - am cbc - holding home iron in setting of sepsis   DM2  CBG 78. A1c 6.7.  Does not appear to be on antiglycemic medications at home.   - sSSI  Hx of chronic pancreatitis/FTT During an admission in 2017 for failure to thrive patient was started on Creon.  Has been continued on this since that time.  Appears well tolerated.   - continue creon.   Depression  On celexa 42mg46mhome.  Not currently endorsing depression.  - continue home celexa.   FEN/GI:  -Heart Healthy/Average Carb -IVF normal saline 100mL 90mphylaxis:  -Xarelto  Disposition: SNF pending further medical work up  Subjective:  No concerns at this time from the patient. Patient's daughter continues to endorse concern about bowel incontinence and that the patient appears to be more confused this morning.    Objective: Temp:  [98.2 F (36.8 C)-98.6 F (37 C)] 98.6 F (37 C) (04/29 0531) Pulse Rate:  [44-48] 44 (04/29 0531) Resp:  [16-18] 18 (04/29 0531) BP:  (148-167)/(76-83) 167/83 (04/29 0531) SpO2:  [97 %-98 %] 98 % (04/29 0531)  Physical Exam:  General: Appears well, no acute distress. Age appropriate. Sitting in bedside chair. Cardiac: RRR, normal heart sounds, no murmurs Respiratory: CTAB, normal effort Neuro: alert and oriented x4  Laboratory: Recent Labs  Lab 05/25/19 0115 05/25/19 0115 05/25/19 0124 05/26/19 0422 05/27/19 0509  WBC 7.0  --   --  9.2 8.4  HGB 11.5*   < > 12.6* 9.6* 10.2*  HCT 37.6*   < > 37.0* 30.3* 31.6*  PLT 128*  --   --  82* 85*   < > = values in this interval not displayed.   Recent Labs  Lab 05/25/19 0115 05/25/19 0124 05/25/19 1133 05/26/19 0422 05/27/19 0509  NA 130*   < > 136 135 135  K 3.7   < > 3.7 3.7 4.2  CL 99   < > 103 106 106  CO2 10*   < > 23 20* 23  BUN 19   < > _0 CREATININE 2.15*   < > 1.89* 1.59* 1.34*  CALCIUM 8.7*   < > 8.5* 7.8* 8.0*  PROT 6.9  --   --   --   --   BILITOT 0.6  --   --   --   --   ALKPHOS 109  --   --   --   --   ALT 20  --   --   --   --   AST 28  --   --   --   --   GLUCOSE 188*   < > 125* 107* 98   < > = values in this interval not displayed.    Imaging/Diagnostic Tests: No new imaging  Gerlene Fee, DO 05/27/2019, 7:47 AM PGY-1, [place] Intern pager: [phone], text pages welcome

## 2019-05-28 LAB — BASIC METABOLIC PANEL
Anion gap: 7 (ref 5–15)
BUN: 10 mg/dL (ref 8–23)
CO2: 23 mmol/L (ref 22–32)
Calcium: 8.1 mg/dL — ABNORMAL LOW (ref 8.9–10.3)
Chloride: 107 mmol/L (ref 98–111)
Creatinine, Ser: 1.23 mg/dL (ref 0.61–1.24)
GFR calc Af Amer: >60 mL/min (ref >60)
GFR calc non Af Amer: 58 mL/min — ABNORMAL LOW (ref >60)
Glucose, Bld: 104 mg/dL — ABNORMAL HIGH (ref 70–99)
Potassium: 3.5 mmol/L (ref 3.5–5.1)
Sodium: 137 mmol/L (ref 135–145)

## 2019-05-28 LAB — CBC WITH DIFFERENTIAL/PLATELET
Abs Immature Granulocytes: 0.03 10*3/uL (ref 0.00–0.07)
Basophils Absolute: 0 10*3/uL (ref 0.0–0.1)
Basophils Relative: 1 %
Eosinophils Absolute: 0.1 10*3/uL (ref 0.0–0.5)
Eosinophils Relative: 1 %
HCT: 32 % — ABNORMAL LOW (ref 39.0–52.0)
Hemoglobin: 10.5 g/dL — ABNORMAL LOW (ref 13.0–17.0)
Immature Granulocytes: 1 %
Lymphocytes Relative: 31 %
Lymphs Abs: 1.6 10*3/uL (ref 0.7–4.0)
MCH: 28.9 pg (ref 26.0–34.0)
MCHC: 32.8 g/dL (ref 30.0–36.0)
MCV: 88.2 fL (ref 80.0–100.0)
Monocytes Absolute: 0.4 10*3/uL (ref 0.1–1.0)
Monocytes Relative: 8 %
Neutro Abs: 2.9 10*3/uL (ref 1.7–7.7)
Neutrophils Relative %: 58 %
Platelets: 98 10*3/uL — ABNORMAL LOW (ref 150–400)
RBC: 3.63 MIL/uL — ABNORMAL LOW (ref 4.22–5.81)
RDW: 17.1 % — ABNORMAL HIGH (ref 11.5–15.5)
WBC: 5 10*3/uL (ref 4.0–10.5)
nRBC: 0 % (ref 0.0–0.2)

## 2019-05-28 LAB — CBC
HCT: 30.3 % — ABNORMAL LOW (ref 39.0–52.0)
Hemoglobin: 9.9 g/dL — ABNORMAL LOW (ref 13.0–17.0)
MCH: 28.9 pg (ref 26.0–34.0)
MCHC: 32.7 g/dL (ref 30.0–36.0)
MCV: 88.3 fL (ref 80.0–100.0)
Platelets: 90 10*3/uL — ABNORMAL LOW (ref 150–400)
RBC: 3.43 MIL/uL — ABNORMAL LOW (ref 4.22–5.81)
RDW: 17.2 % — ABNORMAL HIGH (ref 11.5–15.5)
WBC: 4.8 10*3/uL (ref 4.0–10.5)
nRBC: 0 % (ref 0.0–0.2)

## 2019-05-28 LAB — GLUCOSE, CAPILLARY
Glucose-Capillary: 101 mg/dL — ABNORMAL HIGH (ref 70–99)
Glucose-Capillary: 93 mg/dL (ref 70–99)
Glucose-Capillary: 127 mg/dL — ABNORMAL HIGH (ref 70–99)
Glucose-Capillary: 102 mg/dL — ABNORMAL HIGH (ref 70–99)

## 2019-05-28 NOTE — Progress Notes (Signed)
Occupational Therapy Treatment Patient Details Name: Brian Burns MRN: [id number] DOB: April 03, 1945 Today's Date: 05/28/2019    History of present illness Pt is a 74 y/o male admitted secondary to sepsis and seizure like activity. Thought to be secondary to UTI. PMH includes BPH, HTN, a fib, DM, and chronic pancreatitis.    OT comments  Pt making steady progress towards OT goals this session. Pt more alert and attentive during OT session noted to joke with OTA and state daughters name. Pt complete community distance functional mobility with RW and MIN A. Pt continues to need cues to attend to task as pt can be easily distracted in busy environemnt but noted improvement in standing balance as pt able to step over obstacles in hallway with noted improvement in applying directional cues during mobility.  DC plan remains appropriate, will follow acutely per POC.    Follow Up Recommendations  SNF;Supervision/Assistance - 24 hour;Other (comment)(could progress to home)    Equipment Recommendations  None recommended by OT    Recommendations for Other Services      Precautions / Restrictions Precautions Precautions: Fall Restrictions Weight Bearing Restrictions: No       Mobility Bed Mobility Overal bed mobility: Needs Assistance Bed Mobility: Supine to Sit     Supine to sit: Min assist;HOB elevated     General bed mobility comments: MIN A to scoot hips to EOB and elevate trunk; pt noted to reach out to therapist during bed mobility for assistance  Transfers Overall transfer level: Needs assistance Equipment used: Rolling walker (2 wheeled) Transfers: Sit to/from Stand Sit to Stand: Min assist;From elevated surface         General transfer comment: MINA to initially power into standing    Balance Overall balance assessment: Needs assistance Sitting-balance support: No upper extremity supported;Feet supported Sitting balance-Leahy Scale: Good     Standing balance  support: Bilateral upper extremity supported;During functional activity Standing balance-Leahy Scale: Fair Standing balance comment: Able to remove hands from RW to don mask                           ADL either performed or assessed with clinical judgement   ADL Overall ADL's : Needs assistance/impaired     Grooming: Wash/dry face;Standing;Min guard               Lower Body Dressing: Min guard;Sitting/lateral leans Lower Body Dressing Details (indicate cue type and reason): to pull up socks from EOB Toilet Transfer: Minimal assistance;Ambulation;RW Toilet Transfer Details (indicate cue type and reason): simulated via functional mobility with RW to recliner         Functional mobility during ADLs: Minimal assistance;Rolling walker General ADL Comments: pt more alert and atttentive this session able to state daughters name and joke with OTA. pt able to progress functional mobility distance and attend to directional cues in hallway with increased carryover this session     Vision       Perception     Praxis      Cognition Arousal/Alertness: Awake/alert Behavior During Therapy: WFL for tasks assessed/performed Overall Cognitive Status: Impaired/Different from baseline Area of Impairment: Attention;Memory;Following commands;Awareness;Problem solving                   Current Attention Level: Focused Memory: Decreased short-term memory Following Commands: Follows one step commands with increased time   Awareness: Intellectual Problem Solving: Slow processing General Comments: pt more atttentive this session able to  state daughters name and noted to joke with OTA. pt able to manage RW better this session but continues to require cues on how to manage RW in tight spaces. Pt able to follow all commands with increased time and can be distracted in busy environment. pt noted to pass room number despite max cues        Exercises     Shoulder Instructions        General Comments VSS; condom cath came off during session with RN entering at end of session to reapply    Pertinent Vitals/ Pain       Pain Assessment: No/denies pain  Home Living                                          Prior Functioning/Environment              Frequency  Min 2X/week        Progress Toward Goals  OT Goals(current goals can now be found in the care plan section)  Progress towards OT goals: Progressing toward goals  Acute Rehab OT Goals Patient Stated Goal: get rehab if needed and go home  OT Goal Formulation: With patient/family Time For Goal Achievement: 06/09/19 Potential to Achieve Goals: Good  Plan Discharge plan remains appropriate    Co-evaluation                 AM-PAC OT "6 Clicks" Daily Activity     Outcome Measure   Help from another person eating meals?: None Help from another person taking care of personal grooming?: A Little Help from another person toileting, which includes using toliet, bedpan, or urinal?: A Little Help from another person bathing (including washing, rinsing, drying)?: A Little Help from another person to put on and taking off regular upper body clothing?: A Little Help from another person to put on and taking off regular lower body clothing?: A Little 6 Click Score: 19    End of Session Equipment Utilized During Treatment: Gait belt;Rolling walker  OT Visit Diagnosis: Unsteadiness on feet (R26.81);Other abnormalities of gait and mobility (R26.89)   Activity Tolerance Patient tolerated treatment well   Patient Left in chair;with call bell/phone within reach;with chair alarm set;with family/visitor present   Nurse Communication Mobility status;Other (comment)(condom cath off)        Time: 7673-4193 OT Time Calculation (min): 25 min  Charges: OT General Charges $OT Visit: 1 Visit OT Treatments $Self Care/Home Management : 8-22 mins $Therapeutic Activity: 8-22  mins  Audery Amel., COTA/L Acute Rehabilitation Services [phone]    Angelina Pih 05/28/2019, 1:17 PM

## 2019-05-28 NOTE — Progress Notes (Addendum)
Family Medicine Teaching Service Daily Progress Note Intern Pager: [phone]  Patient name: Brian Burns Medical record number: [id number] Date of birth: 1945-03-11 Age: 74 y.o. Gender: male  Primary Care Provider: Danna Hefty, DO Consultants: None Code Status: FULL  Pt Overview and Major Events to Date:  4/27 Admitted  Assessment and Plan: Brian Burns is a 74 y.o. male presenting with fever and vomiting. PMH is significant for multiple UTIs, hypertension, CVA, paroxysmal A. fib on Xarelto, hyperlipidemia, anemia, BPH, urethral strictures  AMS, sepsis  Bacteremia Alert and oriented x4 today. Overnight was placed on delirium precautions d/t sundowning behavior. Met 3/4 sepsis criteria (no leukocytosis)  w/ UA that showed LE, hgb, proteinuria on admission. Ur cx NGR. Bld cx w/ GPR. In both anaerobic and aerobic bottles Lactobacillus is growing; thought to be contaminate. Will re incubated for speciation if possible. CTX continued overnight. Daughter reports patient appeared to be more confused this morning. He is oriented x4 during my exam.  -Continue Unasyn -repeat bld cx -Delirium precautions - tylenol PRN for fever/pain  Incontinence BPH urethral stricture as below. But new change with bowel incontinence. Has to episodes overnight. Previously continent. Likely due to acute illness. Will monitor for improvement. -q12 bladder scans -continue to monitor   Seizure like activity Occurred after EMS arrived. Description appears consistent with generalized clonic-tonic seizure lasting < 1 minute. Self resolving. No further episodes. No hx of seizures. Discussed with daughter who is his decision maker if they would like neurology consulted to further work up but it seems like both instances are secondary to severe illness/sepsis and she would not like to pursue at this time.   -Monitor for status change overnight.  - neuro checks q4h.  - avoid epileptogenic medications  BPH  with urethral stricture On finasteride and flomax at home.  Sees urology who recently recommended pt in and out cath but pt declined this. Has had dilation, urethrotomy, and urethroplasty in the past.  Has a hx of UTI likely from retention of urine 2/2 strictures and bph. UOP 0.6L  - continue finasteride - continue flomax -outpatient follow up due to the above  Hypertension  BP 152/88. Has been told to reduce lisinopril to 50m from 15md/t side effects of dizziness but was still taking 1070mefore admission.  - hold lisinopril.  - Consider restarting  Proxysmal A Fib HR 47. EKG today w/ sinus brady. No evidence of heart block. Is on xarelto at home but does not appear to be on rate/rhythm control. Pt has 4 ekg's since 2018 that show regular rhythm. Some show bradycardia, which may be why he is not on rate control medications.  - continue home xarelto -continue to monitor HR.  HLD Last lipid panel 2017 with LDL of 90.  - continue atorvostatin 55m57mf/u lipid panel  Anemia Hgb 11.5>9.6>10.2>9. Consistently < 12 g/dl going back to 2015. Currently 12.6 on this admission. Normocytic.  - am cbc - holding home iron   DM2  CBG 101. A1c 6.7.  Does not appear to be on antiglycemic medications at home.   - sSSI  Hx of chronic pancreatitis/FTT During an admission in 2017 for failure to thrive patient was started on Creon.  Has been continued on this since that time.  Appears well tolerated.   - continue creon.   Depression  On celexa 55mg74mhome.  Not currently endorsing depression.  - continue home celexa.   FEN/GI:  -Heart Healthy/Average Carb -IVF normal  saline 124m  Prophylaxis:  -Xarelto  Disposition: SNF pending further medical work up   Subjective:  No concerns at this time.  Objective: Temp:  [97.7 F (36.5 C)-98.3 F (36.8 C)] 98 F (36.7 C) (04/30 0709) Pulse Rate:  [44-50] 47 (04/30 0709) Resp:  [16-20] 16 (04/30 0709) BP: (152-183)/(85-97) 152/88  (04/30 0709) SpO2:  [97 %-99 %] 99 % (04/30 0709)  Physical Exam:  General: Appears tired, no acute distress. Age appropriate. Daughter at bedside Cardiac: RRR, normal heart sounds, no murmurs Respiratory: CTAB, normal effort Neuro: alert and oriented x4 Psych: normal affect  Laboratory: Recent Labs  Lab 05/26/19 0422 05/27/19 0509 05/28/19 0623  WBC 9.2 8.4 4.8  HGB 9.6* 10.2* 9.9*  HCT 30.3* 31.6* 30.3*  PLT 82* 85* 90*   Recent Labs  Lab 05/25/19 0115 05/25/19 0124 05/26/19 0422 05/27/19 0509 05/28/19 0623  NA 130*   < > 135 135 137  K 3.7   < > 3.7 4.2 3.5  CL 99   < > 106 106 107  CO2 10*   < > 20* 23 23  BUN 19   < > '18 13 10  ' CREATININE 2.15*   < > 1.59* 1.34* 1.23  CALCIUM 8.7*   < > 7.8* 8.0* 8.1*  PROT 6.9  --   --   --   --   BILITOT 0.6  --   --   --   --   ALKPHOS 109  --   --   --   --   ALT 20  --   --   --   --   AST 28  --   --   --   --   GLUCOSE 188*   < > 107* 98 104*   < > = values in this interval not displayed.    Imaging/Diagnostic Tests: No new imaging  AGerlene Fee DO 05/28/2019, 8:09 AM PGY-1, [facility] pager: [phone] text pages welcome

## 2019-05-28 NOTE — Care Management Important Message (Signed)
Important Message  Patient Details  Name: Brian Burns MRN: [id number] Date of Birth: 01-17-1946   Medicare Important Message Given:  Yes     [name] 05/28/2019, 1:09 PM

## 2019-05-28 NOTE — TOC Progression Note (Signed)
Transition of Care [facility]) - Progression Note    Patient Details  Name: Brian Burns MRN: [id number] Date of Birth: Nov 30, 1945  Transition of Care [facility]) CM/SW [place], [place] Phone Number: 05/28/2019, 12:53 PM  Clinical Narrative:    CSW met bedside with patient and daughter [name] who was also present. CSW provided bed offers to daughter. Daughter requested [name] which is close to the family. No further questions at this time, CSW will continue to follow.   Expected Discharge Plan: Skilled Nursing Facility Barriers to Discharge: Ship broker, Continued Medical Work up  Expected Discharge Plan and Services Expected Discharge Plan: [place] In-house Referral: Clinical Social Work     Living arrangements for the past 2 months: Single Family Home                                       Social Determinants of Health (SDOH) Interventions    Readmission Risk Interventions No flowsheet data found.

## 2019-05-29 LAB — BASIC METABOLIC PANEL
Anion gap: 7 (ref 5–15)
BUN: 9 mg/dL (ref 8–23)
CO2: 26 mmol/L (ref 22–32)
Calcium: 8.6 mg/dL — ABNORMAL LOW (ref 8.9–10.3)
Chloride: 103 mmol/L (ref 98–111)
Creatinine, Ser: 1.35 mg/dL — ABNORMAL HIGH (ref 0.61–1.24)
GFR calc Af Amer: >60 mL/min (ref >60)
GFR calc non Af Amer: 52 mL/min — ABNORMAL LOW (ref >60)
Glucose, Bld: 111 mg/dL — ABNORMAL HIGH (ref 70–99)
Potassium: 3.4 mmol/L — ABNORMAL LOW (ref 3.5–5.1)
Sodium: 136 mmol/L (ref 135–145)

## 2019-05-29 LAB — GLUCOSE, CAPILLARY
Glucose-Capillary: 93 mg/dL (ref 70–99)
Glucose-Capillary: 119 mg/dL — ABNORMAL HIGH (ref 70–99)
Glucose-Capillary: 105 mg/dL — ABNORMAL HIGH (ref 70–99)
Glucose-Capillary: 112 mg/dL — ABNORMAL HIGH (ref 70–99)

## 2019-05-29 MED ORDER — AMLODIPINE BESYLATE 5 MG PO TABS: 5.0000 mg | ORAL_TABLET | Freq: Every day | ORAL | Status: DC

## 2019-05-29 MED ORDER — POTASSIUM CHLORIDE 20 MEQ PO PACK
40.0000 meq | PACK | Freq: Two times a day (BID) | ORAL | Status: AC
  Administered 2019-05-29 (×2): 40 meq via ORAL
  Filled 2019-05-29 (×3): qty 2

## 2019-05-29 NOTE — Hospital Course (Addendum)
Brian Burns is a 74 y.o. male who presented with fever and vomiting. PMH is significant for multiple UTIs, hypertension, CVA, paroxysmal A. fib on Xarelto, hyperlipidemia, anemia, BPH, urethral strictures   AMS 2/2 septic Bacteremia, Gram Positive Rods Presented w/ 3 episodes of vomiting, dysuria, and seizure like activity. Met 3/4 sepsis criteria (no leukocytosis)  w/ UA that showed LE, hgb, proteinuria and a lactic acid initially >11. Pt was started on vanc/cefepime/flagyl in ED and given 3L LR boluses. Urine culture was without growth. Blood culture grew gram negative rods in both anerobic and aerobic bottles. After further incubation, culture was positive for clostridium, anaerobic gram negative rods and actinotignum schaalii. Infectious disease was curbsided and recommended transitioning from Unasyn (after 5 days) to PO Augmentin for 7 (to end on 06/06/19) additional days.   Seizure like activity Occurred prior to admission with EMS present. Description appears consistent with generalized clonic-tonic seizure lasting < 1 minute. Self resolving. No further episodes during admission. Family decided not to pursue any further neurological work up.  Patient was without any new neurological deficits. Likely due to acute illness above.

## 2019-05-29 NOTE — Plan of Care (Signed)
  Problem: Health Behavior/Discharge Planning: Goal: Ability to manage health-related needs will improve Outcome: Progressing   

## 2019-05-29 NOTE — Progress Notes (Signed)
Family Medicine Teaching Service Daily Progress Note Intern Pager: [phone]  Patient name: Brian Burns Medical record number: [id number] Date of birth: 1945-12-02 Age: 74 y.o. Gender: male  Primary Care Provider: Danna Hefty, DO Consultants: None Code Status: FULL  Pt Overview and Major Events to Date:  4/27 Admitted  Assessment and Plan: Brian Burns is a 74 y.o. male presenting with fever and vomiting. PMH is significant for multiple UTIs, hypertension, CVA, paroxysmal A. fib on Xarelto, hyperlipidemia, anemia, BPH, urethral strictures  AMS, sepsis  Bacteremia ANO x 4 this morning. Able to follow commands. No sundowning reported overnight. Sp Ceftriaxone (4/27-4/28), Cefipime (4/27), Vanc (4/27), Metro (4/27). Blood cx: gram positive rods: Lactobacillus likely contaminant. -Continue Unasyn per ID  -Awaiting anaerobe blood cultures for speciation -Delirium precautions -Tylenol PRN for fever/pain  Incontinence BPH urethral stricture as below. -Q12 bladder scans -Continue to monitor   Seizure like activity, self resolved  No further seizure episodes likely 2/2 to severe llness/sepsis.Daughter would not like further work up at this time.   -Monitor for status change overnight.  -Neuro checks q4h.  - Avoid epileptogenic medications  BPH, urethral stricture, h/o UTIs Home meds: Finasteride and Tamsulosin. UOP 2.92L  Sees urology: recently recommended in and out cath but pt declined. Has had dilation, urethrotomy, and urethroplasty in the past.   -Continue finasteride -Continue tamsulosin -Outpatient follow up with urology   AKI  Cr 1.35 today, 1.23 on 4/30. Cr 2.15 on admission. Baseline ~1-1.2 -D/c fluids today as adequate PO hydration and Cr improved and stable  -Continue to monitor Cr    Hypertension  Bps labile overnight SBP 146-187, DBP 84-90 Home meds: Lisinopril 10mg  once daily -Hold home lisinopril due to AKI on admission  -D/c fluids  today -Consider starting Amlodipine 5mg  on 4/30 if still remains hypertensive after d/c fluids.   Proxysmal A Fib HR 46-52 Home meds: Xaretlo, not on rate controller   -Continue home xarelto -Continue to monitor HR.  HLD Last lipid panel 2017 with LDL of 90.  Chol 131, HDL 36, LDL 79, TG 80,  VLDL 16 -Continue atorvostatin 40mg   Normocytic anemia Hgb 11.5>9.6>10.2>99>10.5. Baseline< 12 g/dl going back to 2015. Currently 12.6 on this admission.  -Monitor with CBC -Holding home iron   TDM2  CBG 93. A1c 6.7.  No antiglycemic medications at home.   - sSSI  Hx of chronic pancreatitis/FTT During an admission in 2017 for failure to thrive patient was started on Creon.  Has been continued on this since that time, tolerating well.   -Continue Creon.   Depression  Home meds: Celexa 40mg  -Continue home celexa.   FEN/GI:  -Heart Healthy/Average Carb -IVF normal saline 146mL  Prophylaxis:  -Xarelto  Disposition: SNF pending further medical work up   Subjective:  Doing well, denies concerns today.   Objective: Temp:  [98 F (36.7 C)-98.2 F (36.8 C)] 98 F (36.7 C) (05/01 0503) Pulse Rate:  [46-52] 52 (05/01 0503) Resp:  [16-20] 16 (05/01 0503) BP: (145-187)/(80-91) 146/84 (05/01 0503) SpO2:  [96 %-98 %] 97 % (05/01 0503) Weight:  [101.7 kg] 101.7 kg (04/30 2057)  Physical Exam:  General: Alert, sitting in bed, pleasant 74 yr old male  Cardio: Normal S1 and S2, RRR Pulm: CTAB, normal WOB  Abdomen: Bowel sounds normal. Abdomen soft and non-tender.  Extremities: No peripheral edema. Warm/ well perfused.   Neuro: Cranial nerves grossly intact, ANO x 4, able to follow commands   Laboratory: Recent Labs  Lab  05/27/19 0509 05/28/19 0623 05/28/19 1152  WBC 8.4 4.8 5.0  HGB 10.2* 9.9* 10.5*  HCT 31.6* 30.3* 32.0*  PLT 85* 90* 98*   Recent Labs  Lab 05/25/19 0115 05/25/19 0124 05/27/19 0509 05/28/19 0623 05/29/19 0439  NA 130*   < > 135 137 136  K 3.7    < > 4.2 3.5 3.4*  CL 99   < > 106 107 103  CO2 10*   < > 23 23 26   BUN 19   < > 13 10 9   CREATININE 2.15*   < > 1.34* 1.23 1.35*  CALCIUM 8.7*   < > 8.0* 8.1* 8.6*  PROT 6.9  --   --   --   --   BILITOT 0.6  --   --   --   --   ALKPHOS 109  --   --   --   --   ALT 20  --   --   --   --   AST 28  --   --   --   --   GLUCOSE 188*   < > 98 104* 111*   < > = values in this interval not displayed.    Imaging/Diagnostic Tests: No new imaging  , MD 05/29/2019, 8:34 AM PGY-1, [facility] Family Medicine FPTS Intern pager: [phone], text pages welcome

## 2019-05-30 LAB — CBC
HCT: 34.9 % — ABNORMAL LOW (ref 39.0–52.0)
Hemoglobin: 11.1 g/dL — ABNORMAL LOW (ref 13.0–17.0)
MCH: 28.8 pg (ref 26.0–34.0)
MCHC: 31.8 g/dL (ref 30.0–36.0)
MCV: 90.4 fL (ref 80.0–100.0)
Platelets: 105 10*3/uL — ABNORMAL LOW (ref 150–400)
RBC: 3.86 MIL/uL — ABNORMAL LOW (ref 4.22–5.81)
RDW: 17.2 % — ABNORMAL HIGH (ref 11.5–15.5)
WBC: 4.6 10*3/uL (ref 4.0–10.5)
nRBC: 0 % (ref 0.0–0.2)

## 2019-05-30 LAB — COMPREHENSIVE METABOLIC PANEL
ALT: 23 U/L (ref 0–44)
AST: 31 U/L (ref 15–41)
Albumin: 2.8 g/dL — ABNORMAL LOW (ref 3.5–5.0)
Alkaline Phosphatase: 80 U/L (ref 38–126)
Anion gap: 11 (ref 5–15)
BUN: 11 mg/dL (ref 8–23)
CO2: 21 mmol/L — ABNORMAL LOW (ref 22–32)
Calcium: 8.6 mg/dL — ABNORMAL LOW (ref 8.9–10.3)
Chloride: 103 mmol/L (ref 98–111)
Creatinine, Ser: 1.45 mg/dL — ABNORMAL HIGH (ref 0.61–1.24)
GFR calc Af Amer: 55 mL/min — ABNORMAL LOW (ref >60)
GFR calc non Af Amer: 48 mL/min — ABNORMAL LOW (ref >60)
Glucose, Bld: 106 mg/dL — ABNORMAL HIGH (ref 70–99)
Potassium: 4.2 mmol/L (ref 3.5–5.1)
Sodium: 135 mmol/L (ref 135–145)
Total Bilirubin: 0.5 mg/dL (ref 0.3–1.2)
Total Protein: 6 g/dL — ABNORMAL LOW (ref 6.5–8.1)

## 2019-05-30 LAB — GLUCOSE, CAPILLARY
Glucose-Capillary: 97 mg/dL (ref 70–99)
Glucose-Capillary: 80 mg/dL (ref 70–99)
Glucose-Capillary: 121 mg/dL — ABNORMAL HIGH (ref 70–99)
Glucose-Capillary: 117 mg/dL — ABNORMAL HIGH (ref 70–99)

## 2019-05-30 MED ORDER — AMLODIPINE BESYLATE 5 MG PO TABS
5.0000 mg | ORAL_TABLET | Freq: Every day | ORAL | Status: DC
  Administered 2019-05-30 – 2019-05-31 (×2): 5 mg via ORAL
  Filled 2019-05-30 (×2): qty 1

## 2019-05-30 NOTE — Progress Notes (Signed)
Family Medicine Teaching Service Daily Progress Note Intern Pager: [phone]  Patient name: Brian Burns Medical record number: [id number] Date of birth: 05-04-1945 Age: 74 y.o. Gender: male  Primary Care Provider: Danna Hefty, DO Consultants: None Code Status: FULL  Pt Overview and Major Events to Date:  4/27 Admitted  Assessment and Plan: Brian Burns is a 74 y.o. male presenting with fever and vomiting. PMH is significant for multiple UTIs, hypertension, CVA, paroxysmal A. fib on Xarelto, hyperlipidemia, anemia, BPH, urethral strictures  AMS, sepsis  Bacteremia Alert this morning (5/2). Able to follow commands. No sundowning reported overnight. Sp Ceftriaxone (4/27-4/28), Cefipime (4/27), Vanc (4/27), Metro (4/27). Blood cx: gram positive rods: Lactobacillus likely contaminant. -Continue Unasyn per ID (4/28-); clinically improving and if this continues consider switching to oral tomorrow on 5/3 -Awaiting anaerobe blood cultures for speciation; still pending on 5/2 -Delirium precautions -Tylenol PRN for fever/pain  Incontinence BPH urethral stricture as below. -Q12 bladder scans; PVR of 266mL -Continue to monitor   Seizure like activity, self resolved  No further seizure episodes likely 2/2 to severe llness/sepsis.Daughter would not like further work up at this time.   -Monitor for status change overnight.  -Neuro checks q4h.  - Avoid epileptogenic medications  BPH, urethral stricture, h/o UTIs Home meds: Finasteride and Tamsulosin. UOP 2.92L  Sees urology: recently recommended in and out cath but pt declined. Has had dilation, urethrotomy, and urethroplasty in the past.   -Continue finasteride -Continue tamsulosin -Outpatient follow up with urology   AKI  Cr 1.35 today, 1.23 on 4/30. Cr 2.15 on admission. Baseline ~1-1.2 -D/c fluids today as adequate PO hydration and Cr improved and stable  -Continue to monitor Cr    Hypertension  Bps continue to be  labile withSBP 140-183, DBP 74-81 over past 24hrs Home meds: Lisinopril 10mg  once daily -Hold home lisinopril due to AKI on admission (Still elevated) - Started Amlodipine 5mg  today  Proxysmal A Fib HR 45-562 Home meds: Xaretlo, not on rate controller   -Continue home xarelto -Continue to monitor HR.  HLD Last lipid panel 2017 with LDL of 90.  Chol 131, HDL 36, LDL 79, TG 80,  VLDL 16 -Continue atorvostatin 40mg   Normocytic anemia Hgb 11.5>9.6>10.2>99>10.5. Baseline< 12 g/dl going back to 2015. Currently 12.6 on this admission.  -Monitor with CBC -Holding home iron   TDM2  CBG 93. A1c 6.7.  No antiglycemic medications at home.   - sSSI  Hx of chronic pancreatitis/FTT During an admission in 2017 for failure to thrive patient was started on Creon.  Has been continued on this since that time, tolerating well.   -Continue Creon.   Depression  Home meds: Celexa 40mg  -Continue home celexa.   FEN/GI:  -Heart Healthy/Average Carb -IVF normal saline 121mL  Prophylaxis:  -Xarelto  Disposition: SNF pending further medical work up   Subjective:  Doing well this morning, no complaints. Sitting up in bed comfortably eating breakfast. He still endorses   Objective: Temp:  [97.8 F (36.6 C)-98.7 F (37.1 C)] 98.7 F (37.1 C) (05/02 0500) Pulse Rate:  [45-62] 45 (05/02 0500) Resp:  [16-18] 17 (05/02 0500) BP: (140-183)/(74-95) 183/81 (05/02 0500) SpO2:  [95 %-100 %] 95 % (05/02 0500)  Physical Exam:  Gen: Alert CV: bradycarida, no murmurs,  Resp: CTAB, no wheezing, rales, or rhonchi, comfortable work of breathing Abd: non-distended, non-tender, soft, +bs Ext: no clubbing, cyanosis, or edema Skin: warm, dry, intact, no rashes  Laboratory: Recent Labs  Lab 05/28/19 [phone]  05/28/19 1152 05/30/19 0540  WBC 4.8 5.0 4.6  HGB 9.9* 10.5* 11.1*  HCT 30.3* 32.0* 34.9*  PLT 90* 98* 105*   Recent Labs  Lab 05/25/19 0115 05/25/19 0124 05/28/19 0623 05/29/19 0439  05/30/19 0540  NA 130*   < > 137 136 135  K 3.7   < > 3.5 3.4* 4.2  CL 99   < > 107 103 103  CO2 10*   < > 23 26 21*  BUN 19   < > 10 9 11   CREATININE 2.15*   < > 1.23 1.35* 1.45*  CALCIUM 8.7*   < > 8.1* 8.6* 8.6*  PROT 6.9  --   --   --  6.0*  BILITOT 0.6  --   --   --  0.5  ALKPHOS 109  --   --   --  80  ALT 20  --   --   --  23  AST 28  --   --   --  31  GLUCOSE 188*   < > 104* 111* 106*   < > = values in this interval not displayed.    Imaging/Diagnostic Tests: No new imaging  , DO 05/30/2019, 7:27 AM PGY-3, [facility] Family Medicine FPTS Intern pager: [phone], text pages welcome

## 2019-05-30 NOTE — Plan of Care (Signed)
  Problem: Activity: Goal: Risk for activity intolerance will decrease Outcome: Progressing   

## 2019-05-30 NOTE — Progress Notes (Signed)
C.Anderson, night intern,  texted re: call from [place] [employer] that pt had a 2.24 second pause. Has had 1+ second pauses prior but this was first 2+ second pause. Pt was sleeping at the time and had no complaints.

## 2019-05-31 LAB — CBC
HCT: 32.7 % — ABNORMAL LOW (ref 39.0–52.0)
Hemoglobin: 10.5 g/dL — ABNORMAL LOW (ref 13.0–17.0)
MCH: 28.6 pg (ref 26.0–34.0)
MCHC: 32.1 g/dL (ref 30.0–36.0)
MCV: 89.1 fL (ref 80.0–100.0)
Platelets: 108 10*3/uL — ABNORMAL LOW (ref 150–400)
RBC: 3.67 MIL/uL — ABNORMAL LOW (ref 4.22–5.81)
RDW: 17.4 % — ABNORMAL HIGH (ref 11.5–15.5)
WBC: 4.7 10*3/uL (ref 4.0–10.5)
nRBC: 0 % (ref 0.0–0.2)

## 2019-05-31 LAB — CULTURE, BLOOD (ROUTINE X 2): Special Requests: ADEQUATE

## 2019-05-31 LAB — GLUCOSE, CAPILLARY
Glucose-Capillary: 100 mg/dL — ABNORMAL HIGH (ref 70–99)
Glucose-Capillary: 95 mg/dL (ref 70–99)
Glucose-Capillary: 143 mg/dL — ABNORMAL HIGH (ref 70–99)
Glucose-Capillary: 104 mg/dL — ABNORMAL HIGH (ref 70–99)

## 2019-05-31 LAB — SARS CORONAVIRUS 2 (TAT 6-24 HRS): SARS Coronavirus 2: NEGATIVE

## 2019-05-31 MED ORDER — LISINOPRIL 10 MG PO TABS: 10.0000 mg | ORAL_TABLET | Freq: Every day | ORAL | Status: DC

## 2019-05-31 MED ORDER — AMLODIPINE BESYLATE 10 MG PO TABS
10.0000 mg | ORAL_TABLET | Freq: Every day | ORAL | Status: DC
  Administered 2019-06-01: 10 mg via ORAL
  Filled 2019-05-31: qty 1

## 2019-05-31 MED ORDER — AMOXICILLIN-POT CLAVULANATE 875-125 MG PO TABS
1.0000 | ORAL_TABLET | Freq: Two times a day (BID) | ORAL | Status: DC
  Administered 2019-05-31 – 2019-06-01 (×4): 1 via ORAL
  Filled 2019-05-31 (×4): qty 1

## 2019-05-31 NOTE — Progress Notes (Signed)
Spoke with Dr. Cliffton Asters via telephone in order to discuss recommendations for antibiotic therapy for patient's blood culture with multiple anaerobic and gram-positive organisms including Clostridium, Lactobacillus and actinotignum schaalii.  Dr. Orvan Falconer advised that most of these organisms are likely contaminants, however to not a nor the Clostridium.  Stated that Unasyn was the correct coverage for this organism.  He recommended that as patient is close to discharge, start him on oral Augmentin for a 7-day course.  Unasyn discontinued and Augmentin started.  Nicki Guadalajara, MD  PGY-1, [facility] Family Medicine  FPTS Intern Pager [phone]

## 2019-05-31 NOTE — Progress Notes (Addendum)
Family Medicine Teaching Service Daily Progress Note Intern Pager: [phone]  Patient name: Brian Burns Medical record number: [id number] Date of birth: [birth year] Age: 74 y.o. Gender: male  Primary Care Provider: Joana Reamer, DO Consultants: None Code Status: FULL  Pt Overview and Major Events to Date:  4/27 Admitted  Assessment and Plan: Brian Burns is a 74 y.o. male who presented with fever and vomiting. PMH is significant for multiple UTIs, hypertension, CVA, paroxysmal A. fib on Xarelto, hyperlipidemia, anemia, BPH, urethral strictures  AMS, sepsis  Bacteremia Patient transitioned from Unasyn to Augmentin PO 875mg  q 12 hours for coverage of gram positive organisms on blood cultures.  No new developments overnight.  Patient with no complaints this morning. Sp Ceftriaxone (4/27-4/28) Cefipime (4/27)  Vanc (4/27)  Metro (4/27)  Blood cx: gram positive rods: Lactobacillus, likely contaminant.  Blood culture reintubated and growth shows: clodistrum species and multiple anaerobes, gram-positive andactinotignum schaalii.  - Augmentin 875 mg every 12 hours, today day 2 of 7 and on 5/9 - Delirium precautions - Tylenol PRN for fever/pain  Incontinence BPH urethral stricture as below. - Q12 bladder scans - Continue to monitor   Seizure like activity, self resolved  No further seizure episodes likely 2/2 to severe llness/sepsis. Daughter would not like further work up at this time.   -Monitor for status change overnight.  -Neuro checks q4h.  - Avoid epileptogenic medications  BPH, urethral stricture, h/o UTIs Home meds: Finasteride and Tamsulosin. UOP 1750L  Sees urology: recently recommended in and out cath but pt declined. Has had dilation, urethrotomy, and urethroplasty in the past.  - Continue finasteride - Continue tamsulosin - Outpatient follow up with urology   AKI  Last Cr 1.23 on 4/30, improved from admission.Today Cr 1.39. Cr 2.15 on admission.  Baseline ~1-1.2 -IVFs discontinued  -Continue to monitor Cr  With AM BMP   Hypertension  Bps overnight ranged from 115-148/74-84. Home meds: Lisinopril 10mg  once daily -continue to hold home lisinopril given bump in Cr yesterday, will likely start after dc at discretion of primary care provider as long as Cr normalized - increased  Amlodipine from 5mg  to 10mg  to start today   Proxysmal A Fib HR 50-56 Home meds: Xaretlo, not on rate controller   - Continue home xarelto - Continue to monitor HR.  HLD Last lipid panel 2017 with LDL of 90.  Chol 131, HDL 36, LDL 79, TG 80,  VLDL 16 - Continue atorvostatin 40mg   Normocytic anemia Hgb 11.5>9.6>10.2>99> stable at 10.5. Baseline< 12 g/dl going back to .Hemoglobin was 12.6 on this admission.  - Monitor with CBC - Consider restarting home iron   TDM2  Last CBG 100. A1c 6.7.  No antiglycemic medications at home.   - sSSI  Hx of chronic pancreatitis/FTT During an admission in 2017 for failure to thrive patient was started on Brian Burns.  Has been continued on this since that time, tolerating well.   - Continue Brian Burns.   Depression  Home meds: Celexa 40mg  - Continue home celexa.   FEN/GI:  - Heart Healthy/Average Carb - IVF normal saline  Prophylaxis:  - Xarelto  Disposition: Patient medically stable, anticipate discharge to skilled nursing facility today 5/4  Subjective:  Patient reports no new complaints this morning.  Patient states that he is doing well.  Does report regular bowel movements after eating.  Objective: Temp:  [98 F (36.7 C)-98.3 F (36.8 C)] 98.2 F (36.8 C) (05/04 0930) Pulse Rate:  [48-54] 54 (  05/04 0930) Resp:  [18] 18 (05/04 0930) BP: (133-148)/(74-90) 133/90 (05/04 0930) SpO2:  [93 %-98 %] 93 % (05/04 0930)  Physical Exam:  General: Alert and cooperative and appears to be in no acute distress, sitting up in bed watching television HEENT: Moist mucous membranes Cardio: Normal S1 and  S2, no S3 or S4. Rhythm is regular.  Bilateral radial pulses palpable Pulm: Clear to auscultation bilaterally, no crackles, wheezing, or diminished breath sounds. Normal respiratory effort on room air Abdomen: Bowel sounds normal. Abdomen soft and non-tender.  Extremities: No peripheral edema. Warm/ well perfused. Neuro: Oriented to self, location, year, president and state  Laboratory: Recent Labs  Lab 05/30/19 0540 05/31/19 0505 06/01/19 0343  WBC 4.6 4.7 4.6  HGB 11.1* 10.5* 10.4*  HCT 34.9* 32.7* 31.7*  PLT 105* 108* 120*   Recent Labs  Lab 05/29/19 0439 05/30/19 0540 06/01/19 0343  NA 136 135 134*  K 3.4* 4.2 3.6  CL 103 103 102  CO2 26 21* 25  BUN 9 11 14   CREATININE 1.35* 1.45* 1.39*  CALCIUM 8.6* 8.6* 8.5*  PROT  --  6.0*  --   BILITOT  --  0.5  --   ALKPHOS  --  80  --   ALT  --  23  --   AST  --  31  --   GLUCOSE 111* 106* 98    Imaging/Diagnostic Tests: No results found.   Brian Klein, MD 06/01/2019, 1:26 PM PGY-3, [facility] Intern pager: [phone], text pages welcome

## 2019-05-31 NOTE — Plan of Care (Signed)
  Problem: Education: Goal: Knowledge of General Education information will improve Description Including pain rating scale, medication(s)/side effects and non-pharmacologic comfort measures Outcome: Progressing   

## 2019-05-31 NOTE — Progress Notes (Signed)
Physical Therapy Treatment Patient Details Name: Brian Burns MRN: [id number] DOB: July 18, 1945 Today's Date: 05/31/2019    History of Present Illness Pt is a 74 y/o male admitted secondary to sepsis and seizure like activity. Thought to be secondary to UTI. PMH includes BPH, HTN, a fib, DM, and chronic pancreatitis.     PT Comments    Continuing work on functional mobility and activity tolerance;  Brian Burns participates very well with therapies, and is motivated to continually improve -- to the extent for the need to cue him to self-monitor for activity tolerance; Notable very good improvements here acutely when compared to initial eval; He pushes himself; Noted fatigue with incr distance walked, with bilateral knees increasingly flexed in stance with more time walking; it is just him and his wife (who cannot give much physical assist) at home, and I remain concerned about her ability to care for him; I continue to recommend SNF for post-acute rehab to maximize independence and safety with mobility and ADLs and allow for safe dc home; Overall progressing well; Anticipate continuing good progress at post-acute rehabilitation.   Follow Up Recommendations  SNF;Supervision/Assistance - 24 hour     Equipment Recommendations  Rolling walker with 5" wheels    Recommendations for Other Services       Precautions / Restrictions Precautions Precautions: Fall    Mobility  Bed Mobility Overal bed mobility: Needs Assistance Bed Mobility: Supine to Sit     Supine to sit: Min assist;HOB elevated     General bed mobility comments: Min handheld assist to pull to sit  Transfers Overall transfer level: Needs assistance Equipment used: Rolling walker (2 wheeled) Transfers: Sit to/from Stand Sit to Stand: Mod assist(from low surface)         General transfer comment: Light mod assist to power up from bed in lowest position; cues for hand placement and  safety  Ambulation/Gait Ambulation/Gait assistance: Min assist Gait Distance (Feet): 110 Feet Assistive device: Rolling walker (2 wheeled) Gait Pattern/deviations: Decreased stride length;Shuffle;Trunk flexed     General Gait Details: Cued for posture and increased stride length.  Tending to have hips and knees slightly flexed bilaterally throughout gait cycle, leading to less stability; Required assist for RW in tight areas.  Adjusted RW height for optimal fit   Stairs             Wheelchair Mobility    Modified Rankin (Stroke Patients Only)       Balance     Sitting balance-Leahy Scale: Good       Standing balance-Leahy Scale: Fair                              Cognition Arousal/Alertness: Awake/alert Behavior During Therapy: WFL for tasks assessed/performed Overall Cognitive Status: Impaired/Different from baseline                               Problem Solving: Slow processing General Comments: pt more atttentive this session. pt able to manage RW better this session but continues to require cues on how to manage RW in tight spaces. Pt able to follow all commands with increased time and can be distracted in busy environment. pt noted to pass room number despite max cues      Exercises      General Comments General comments (skin integrity, edema, etc.): VSS on room air  Pertinent Vitals/Pain Pain Assessment: No/denies pain    Home Living                      Prior Function            PT Goals (current goals can now be found in the care plan section) Acute Rehab PT Goals Patient Stated Goal: agreeable to ambulate  PT Goal Formulation: With patient/family Time For Goal Achievement: 06/08/19 Potential to Achieve Goals: Good Progress towards PT goals: Progressing toward goals    Frequency    Min 3X/week      PT Plan Current plan remains appropriate    Co-evaluation              AM-PAC PT "6  Clicks" Mobility   Outcome Measure  Help needed turning from your back to your side while in a flat bed without using bedrails?: None Help needed moving from lying on your back to sitting on the side of a flat bed without using bedrails?: A Little Help needed moving to and from a bed to a chair (including a wheelchair)?: A Little Help needed standing up from a chair using your arms (e.g., wheelchair or bedside chair)?: A Little Help needed to walk in hospital room?: A Little Help needed climbing 3-5 steps with a railing? : A Lot 6 Click Score: 18    End of Session Equipment Utilized During Treatment: Gait belt Activity Tolerance: Patient tolerated treatment well Patient left: in chair;with call bell/phone within reach;with chair alarm set Nurse Communication: Mobility status PT Visit Diagnosis: Unsteadiness on feet (R26.81);Muscle weakness (generalized) (M62.81)     Time: 5397-6734 PT Time Calculation (min) (ACUTE ONLY): 22 min  Charges:     1 Gait Training                   Van Clines, [place]  Acute Rehabilitation Services Pager [phone] Office [phone]    Levi Aland 05/31/2019, 1:29 PM

## 2019-05-31 NOTE — Progress Notes (Signed)
Family Medicine Teaching Service Daily Progress Note Intern Pager: [phone]  Patient name: Brian Burns Medical record number: [id number] Date of birth: 11/11/1945 Age: 74 y.o. Gender: male  Primary Care Provider: Danna Hefty, DO Consultants: None Code Status: FULL  Pt Overview and Major Events to Date:  4/27 Admitted  Assessment and Plan: Brian Burns is a 74 y.o. male who presented with fever and vomiting. PMH is significant for multiple UTIs, hypertension, CVA, paroxysmal A. fib on Xarelto, hyperlipidemia, anemia, BPH, urethral strictures  AMS, sepsis  Bacteremia Blood cultures re-incubated and growing actinotignum schaalii and clostridium species. Patient on unasyn since 4/28. Patient afebrile overnight and vitals within normal limits. MS today appears to be very much improved.  Patient is alert and oriented to self, location, year, month, date of birth and president. Sp Ceftriaxone (4/27-4/28) Cefipime (4/27)  Vanc (4/27)  Metro (4/27)  Blood cx: gram positive rods: Lactobacillus, likely contaminant.  Blood culture reintubated and growth shows: clodistrum species and multiple anaerobes, gram-positive andactinotignum schaalii.  - Continue Unasyn per ID (4/28-); clinically improving, consider switching to oral Augmentin 875 mg every 12 hours versus discontinuing all antibiotics is these cultures believed to be contaminant - Awaiting anaerobe blood cultures for speciation; still pending on 5/2 - Delirium precautions - Tylenol PRN for fever/pain  Incontinence BPH urethral stricture as below. - Q12 bladder scans - Continue to monitor   Seizure like activity, self resolved  No further seizure episodes likely 2/2 to severe llness/sepsis.Daughter would not like further work up at this time.   -Monitor for status change overnight.  -Neuro checks q4h.  - Avoid epileptogenic medications  BPH, urethral stricture, h/o UTIs Home meds: Finasteride and Tamsulosin. UOP  1750L  Sees urology: recently recommended in and out cath but pt declined. Has had dilation, urethrotomy, and urethroplasty in the past.  - Continue finasteride - Continue tamsulosin - Outpatient follow up with urology   AKI  Last Cr 1.23 on 4/30, improved from admission. Cr 2.15 on admission. Baseline ~1-1.2 -IVFs discontinued  -Continue to monitor Cr  With AM BMP   Hypertension  Bps with mild elevation 145-166/78-97 overnight. Home meds: Lisinopril 10mg  once daily - restart home lisinopril 10mg  dgiven improved Cr - continue Amlodipine 5mg    Proxysmal A Fib HR 50-56 Home meds: Xaretlo, not on rate controller   - Continue home xarelto - Continue to monitor HR.  HLD Last lipid panel 2017 with LDL of 90.  Chol 131, HDL 36, LDL 79, TG 80,  VLDL 16 - Continue atorvostatin 40mg   Normocytic anemia Hgb 11.5>9.6>10.2>99> stable at 10.5. Baseline< 12 g/dl going back to 2015.Hemoglobin was 12.6 on this admission.  - Monitor with CBC - Consider restarting home iron   TDM2  Last CBG 100. A1c 6.7.  No antiglycemic medications at home.   - sSSI  Hx of chronic pancreatitis/FTT During an admission in 2017 for failure to thrive patient was started on Creon.  Has been continued on this since that time, tolerating well.   - Continue Creon.   Depression  Home meds: Celexa 40mg  - Continue home celexa.   FEN/GI:  - Heart Healthy/Average Carb - IVF normal saline 129mL  Prophylaxis:  - Xarelto  Disposition: SNF pending further medical work up   Subjective:  Patient reports he is doing well and has no complaints.  Patient states that he felt pain in between his shoulder blades overnight but that it went away after 3 to 4 minutes.  He reports  no pain at this time or difficulty breathing.  No feelings of fevers or chills.  Objective: Temp:  [97.8 F (36.6 C)-98.2 F (36.8 C)] 97.8 F (36.6 C) (05/03 0845) Pulse Rate:  [50-58] 58 (05/03 0845) Resp:  [16-20] 20 (05/03  0845) BP: (115-149)/(84-97) 115/84 (05/03 0845) SpO2:  [95 %-100 %] 95 % (05/03 0845) Weight:  [101.4 kg] 101.4 kg (05/03 0500)  Physical Exam:  Gen: Elderly male in no acute distress sitting up in bed watching television CV: Regular rate and rhythm without murmurs, radial pulses palpable bilaterally Resp: Clear to auscultation without wheezing, no crackles no signs of respiratory distress Abd: Soft and nontender with bowel sounds present throughout Ext: No lower extremity edema Skin: No lesions or ulcerations, lower extremity skin dry  Laboratory: Recent Labs  Lab 05/28/19 1152 05/30/19 0540 05/31/19 0505  WBC 5.0 4.6 4.7  HGB 10.5* 11.1* 10.5*  HCT 32.0* 34.9* 32.7*  PLT 98* 105* 108*   Recent Labs  Lab 05/25/19 0115 05/25/19 0124 05/28/19 0623 05/29/19 0439 05/30/19 0540  NA 130*   < > 137 136 135  K 3.7   < > 3.5 3.4* 4.2  CL 99   < > 107 103 103  CO2 10*   < > 23 26 21*  BUN 19   < > 10 9 11   CREATININE 2.15*   < > 1.23 1.35* 1.45*  CALCIUM 8.7*   < > 8.1* 8.6* 8.6*  PROT 6.9  --   --   --  6.0*  BILITOT 0.6  --   --   --  0.5  ALKPHOS 109  --   --   --  80  ALT 20  --   --   --  23  AST 28  --   --   --  31  GLUCOSE 188*   < > 104* 111* 106*   < > = values in this interval not displayed.    Imaging/Diagnostic Tests: No results found.   , MD 05/31/2019, 9:44 AM PGY-3, [facility] Intern pager: [phone], text pages welcome

## 2019-06-01 DIAGNOSIS — I2510 Atherosclerotic heart disease of native coronary artery without angina pectoris: Secondary | ICD-10-CM | POA: Diagnosis not present

## 2019-06-01 DIAGNOSIS — R808 Other proteinuria: Secondary | ICD-10-CM | POA: Diagnosis not present

## 2019-06-01 DIAGNOSIS — R6520 Severe sepsis without septic shock: Secondary | ICD-10-CM | POA: Diagnosis not present

## 2019-06-01 DIAGNOSIS — E1159 Type 2 diabetes mellitus with other circulatory complications: Secondary | ICD-10-CM | POA: Diagnosis not present

## 2019-06-01 DIAGNOSIS — E1122 Type 2 diabetes mellitus with diabetic chronic kidney disease: Secondary | ICD-10-CM | POA: Diagnosis not present

## 2019-06-01 DIAGNOSIS — I69398 Other sequelae of cerebral infarction: Secondary | ICD-10-CM | POA: Diagnosis not present

## 2019-06-01 DIAGNOSIS — Z7401 Bed confinement status: Secondary | ICD-10-CM | POA: Diagnosis not present

## 2019-06-01 DIAGNOSIS — G9340 Encephalopathy, unspecified: Secondary | ICD-10-CM | POA: Diagnosis not present

## 2019-06-01 DIAGNOSIS — E7800 Pure hypercholesterolemia, unspecified: Secondary | ICD-10-CM | POA: Diagnosis not present

## 2019-06-01 DIAGNOSIS — M6281 Muscle weakness (generalized): Secondary | ICD-10-CM | POA: Diagnosis not present

## 2019-06-01 DIAGNOSIS — I480 Paroxysmal atrial fibrillation: Secondary | ICD-10-CM | POA: Diagnosis not present

## 2019-06-01 DIAGNOSIS — R7881 Bacteremia: Secondary | ICD-10-CM | POA: Diagnosis not present

## 2019-06-01 DIAGNOSIS — E1140 Type 2 diabetes mellitus with diabetic neuropathy, unspecified: Secondary | ICD-10-CM | POA: Diagnosis not present

## 2019-06-01 DIAGNOSIS — N401 Enlarged prostate with lower urinary tract symptoms: Secondary | ICD-10-CM | POA: Diagnosis not present

## 2019-06-01 DIAGNOSIS — R569 Unspecified convulsions: Secondary | ICD-10-CM | POA: Diagnosis not present

## 2019-06-01 DIAGNOSIS — I739 Peripheral vascular disease, unspecified: Secondary | ICD-10-CM | POA: Diagnosis not present

## 2019-06-01 DIAGNOSIS — E860 Dehydration: Secondary | ICD-10-CM | POA: Diagnosis not present

## 2019-06-01 DIAGNOSIS — I129 Hypertensive chronic kidney disease with stage 1 through stage 4 chronic kidney disease, or unspecified chronic kidney disease: Secondary | ICD-10-CM | POA: Diagnosis not present

## 2019-06-01 DIAGNOSIS — N3001 Acute cystitis with hematuria: Secondary | ICD-10-CM | POA: Diagnosis not present

## 2019-06-01 DIAGNOSIS — A419 Sepsis, unspecified organism: Secondary | ICD-10-CM | POA: Diagnosis not present

## 2019-06-01 DIAGNOSIS — K219 Gastro-esophageal reflux disease without esophagitis: Secondary | ICD-10-CM | POA: Diagnosis not present

## 2019-06-01 DIAGNOSIS — R1311 Dysphagia, oral phase: Secondary | ICD-10-CM | POA: Diagnosis not present

## 2019-06-01 DIAGNOSIS — N35119 Postinfective urethral stricture, not elsewhere classified, male, unspecified: Secondary | ICD-10-CM | POA: Diagnosis not present

## 2019-06-01 DIAGNOSIS — E1169 Type 2 diabetes mellitus with other specified complication: Secondary | ICD-10-CM | POA: Diagnosis not present

## 2019-06-01 DIAGNOSIS — N183 Chronic kidney disease, stage 3 unspecified: Secondary | ICD-10-CM | POA: Diagnosis not present

## 2019-06-01 DIAGNOSIS — N179 Acute kidney failure, unspecified: Secondary | ICD-10-CM | POA: Diagnosis not present

## 2019-06-01 DIAGNOSIS — M2550 Pain in unspecified joint: Secondary | ICD-10-CM | POA: Diagnosis not present

## 2019-06-01 LAB — CBC WITH DIFFERENTIAL/PLATELET
Abs Immature Granulocytes: 0.08 10*3/uL — ABNORMAL HIGH (ref 0.00–0.07)
Basophils Absolute: 0 10*3/uL (ref 0.0–0.1)
Basophils Relative: 0 %
Eosinophils Absolute: 0.1 10*3/uL (ref 0.0–0.5)
Eosinophils Relative: 2 %
HCT: 31.7 % — ABNORMAL LOW (ref 39.0–52.0)
Hemoglobin: 10.4 g/dL — ABNORMAL LOW (ref 13.0–17.0)
Immature Granulocytes: 2 %
Lymphocytes Relative: 42 %
Lymphs Abs: 1.9 10*3/uL (ref 0.7–4.0)
MCH: 28.9 pg (ref 26.0–34.0)
MCHC: 32.8 g/dL (ref 30.0–36.0)
MCV: 88.1 fL (ref 80.0–100.0)
Monocytes Absolute: 0.5 10*3/uL (ref 0.1–1.0)
Monocytes Relative: 11 %
Neutro Abs: 2 10*3/uL (ref 1.7–7.7)
Neutrophils Relative %: 43 %
Platelets: 120 10*3/uL — ABNORMAL LOW (ref 150–400)
RBC: 3.6 MIL/uL — ABNORMAL LOW (ref 4.22–5.81)
RDW: 17.3 % — ABNORMAL HIGH (ref 11.5–15.5)
WBC: 4.6 10*3/uL (ref 4.0–10.5)
nRBC: 0 % (ref 0.0–0.2)

## 2019-06-01 LAB — BASIC METABOLIC PANEL
Anion gap: 7 (ref 5–15)
BUN: 14 mg/dL (ref 8–23)
CO2: 25 mmol/L (ref 22–32)
Calcium: 8.5 mg/dL — ABNORMAL LOW (ref 8.9–10.3)
Chloride: 102 mmol/L (ref 98–111)
Creatinine, Ser: 1.39 mg/dL — ABNORMAL HIGH (ref 0.61–1.24)
GFR calc Af Amer: 58 mL/min — ABNORMAL LOW (ref >60)
GFR calc non Af Amer: 50 mL/min — ABNORMAL LOW (ref >60)
Glucose, Bld: 98 mg/dL (ref 70–99)
Potassium: 3.6 mmol/L (ref 3.5–5.1)
Sodium: 134 mmol/L — ABNORMAL LOW (ref 135–145)

## 2019-06-01 LAB — GLUCOSE, CAPILLARY
Glucose-Capillary: 93 mg/dL (ref 70–99)
Glucose-Capillary: 90 mg/dL (ref 70–99)

## 2019-06-01 MED ORDER — AMOXICILLIN-POT CLAVULANATE 875-125 MG PO TABS: 1.0000 | ORAL_TABLET | Freq: Two times a day (BID) | ORAL | 0 refills | Status: AC

## 2019-06-01 MED ORDER — AMLODIPINE BESYLATE 10 MG PO TABS: 10.0000 mg | ORAL_TABLET | Freq: Every day | ORAL | 0 refills | Status: DC

## 2019-06-01 NOTE — Discharge Summary (Addendum)
[facility] Discharge Summary  Patient name: Brian Burns Medical record number: [id number] Date of birth: 03/03/1945 Age: 74 y.o. Gender: male Date of Admission: 05/25/2019  Date of Discharge: 06/01/19 Admitting Physician: Carollee Leitz, MD  Primary Care Provider: Danna Hefty, DO Consultants: None  Indication for Hospitalization: sepsis, seizures and fever   Discharge Diagnoses/Problem List:  Active Problems:   AKI (acute kidney injury) ([place])   Sepsis ([place])   Encephalopathy   Seizure ([place])  Disposition: discharge to skilled nursing facility   Discharge Condition: stable and improved   Discharge Exam:  General: Alert and cooperative and appears to be in no acute distress, sitting up in bed watching television HEENT: Moist mucous membranes Cardio: Normal S1 and S2, no S3 or S4. Rhythm is regular.  Bilateral radial pulses palpable Pulm: Clear to auscultation bilaterally, no crackles, wheezing, or diminished breath sounds. Normal respiratory effort on room air Abdomen: Bowel sounds normal. Abdomen soft and non-tender.  Extremities: No peripheral edema. Warm/ well perfused. Neuro: Oriented to self, location, year, president and state  Brief Hospital Course:  Brian Burns is a 74 y.o. male who presented with fever and vomiting. PMH is significant for multiple UTIs, hypertension, CVA, paroxysmal A. fib on Xarelto, hyperlipidemia, anemia, BPH, urethral strictures   AMS 2/2 septic Bacteremia, Gram Positive Rods Presented w/ 3 episodes of vomiting, dysuria, and seizure like activity. Met 3/4 sepsis criteria (no leukocytosis)  w/ UA that showed LE, hgb, proteinuria and a lactic acid initially >11. Pt was started on vanc/cefepime/flagyl in ED and given 3L LR boluses. Urine culture was without growth. Blood culture grew gram negative rods in both anerobic and aerobic bottles. After further incubation, culture was positive for clostridium, anaerobic gram  negative rods and actinotignum schaalii. Infectious disease was curbsided and recommended transitioning from Unasyn (after 5 days) to PO Augmentin for 7 (to end on 06/06/19) additional days.   Seizure like activity Occurred prior to admission with EMS present. Description appears consistent with generalized clonic-tonic seizure lasting < 1 minute. Self resolving. No further episodes during admission. Family decided not to pursue any further neurological work up.  Patient was without any new neurological deficits. Likely due to acute illness above.    Issues for Follow Up:  1. Patient will need urology follow up for urethral strictures.  2. Recommend checking BMP to monitor Creatinine as it was elevated to greater than 2 during admission and was 1.39 on day of discharge.  3. Patient should continue PO Augmentin with last does being on 06/06/19. Please confirm he has been able to obtain this prescription and is completing this course for bacteremia.  4. Discontinued lisinopril due to AKI. Hypertensive during admission and amlodipine titrated up to 34m. Please restart as appropriate based on improved creatinine.  Significant Procedures: None  Significant Labs and Imaging:  Recent Labs  Lab 05/30/19 0540 05/31/19 0505 06/01/19 0343  WBC 4.6 4.7 4.6  HGB 11.1* 10.5* 10.4*  HCT 34.9* 32.7* 31.7*  PLT 105* 108* 120*   Recent Labs  Lab 05/27/19 0509 05/27/19 0509 05/28/19 0623 05/28/19 0623 05/29/19 0439 05/29/19 0439 05/30/19 0540 06/01/19 0343  NA 135  --  137  --  136  --  135 134*  K 4.2   < > 3.5   < > 3.4*   < > 4.2 3.6  CL 106  --  107  --  103  --  103 102  CO2 23  --  23  --  26  --  21* 25  GLUCOSE 98  --  104*  --  111*  --  106* 98  BUN 13  --  10  --  9  --  11 14  CREATININE 1.34*  --  1.23  --  1.35*  --  1.45* 1.39*  CALCIUM 8.0*  --  8.1*  --  8.6*  --  8.6* 8.5*  ALKPHOS  --   --   --   --   --   --  80  --   AST  --   --   --   --   --   --  31  --   ALT  --   --    --   --   --   --  23  --   ALBUMIN  --   --   --   --   --   --  2.8*  --    < > = values in this interval not displayed.   CT HEAD WO CONTRAST  Result Date: 05/25/2019 CLINICAL DATA:  Seizure EXAM: CT HEAD WITHOUT CONTRAST TECHNIQUE: Contiguous axial images were obtained from the base of the skull through the vertex without intravenous contrast. COMPARISON:  Head CT 01/24/2015 FINDINGS: Brain: There is no mass, hemorrhage or extra-axial collection. There is generalized atrophy without lobar predilection. Hypodensity of the white matter is most commonly associated with chronic microvascular disease. There are old bilateral basal ganglia small vessel infarcts. Vascular: Atherosclerotic calcification of the vertebral and internal carotid arteries at the skull base. No abnormal hyperdensity of the major intracranial arteries or dural venous sinuses. Skull: The visualized skull base, calvarium and extracranial soft tissues are normal. Sinuses/Orbits: No fluid levels or advanced mucosal thickening of the visualized paranasal sinuses. No mastoid or middle ear effusion. The orbits are normal. IMPRESSION: Chronic microvascular ischemia and generalized atrophy without acute intracranial abnormality. Electronically Signed   By: Ulyses Jarred M.D.   On: 05/25/2019 02:26   DG Chest Port 1 View  Result Date: 05/25/2019 CLINICAL DATA:  Fever. EXAM: PORTABLE CHEST 1 VIEW COMPARISON:  July 14, 2017 FINDINGS: Mild, stable increased lung markings are seen involving predominantly the bilateral lung bases. There is no evidence of acute infiltrate, pleural effusion or pneumothorax. The heart size and mediastinal contours are within normal limits. There is mild calcification of the aortic arch. The visualized skeletal structures are unremarkable. IMPRESSION: No active disease. Electronically Signed   By: Virgina Norfolk M.D.   On: 05/25/2019 01:31    Results/Tests Pending at Time of Discharge: none  Discharge  Medications:  Allergies as of 06/01/2019      Reactions   Gabapentin Other (See Comments)   Sedation at 339m TID      Medication List    STOP taking these medications   lisinopril 20 MG tablet Commonly known as: ZESTRIL     TAKE these medications   acetaminophen 325 MG tablet Commonly known as: TYLENOL Take 325 mg by mouth every 6 (six) hours as needed for moderate pain.   amLODipine 10 MG tablet Commonly known as: NORVASC Take 1 tablet (10 mg total) by mouth daily. Start taking on: Jun 02, 2019   amoxicillin-clavulanate 875-125 MG tablet Commonly known as: AUGMENTIN Take 1 tablet by mouth every 12 (twelve) hours for 5 days.   atorvastatin 40 MG tablet Commonly known as: LIPITOR Take 1 tablet (40 mg total) by mouth daily.  citalopram 40 MG tablet Commonly known as: CELEXA Take 1 tablet (40 mg total) by mouth at bedtime.   ergocalciferol 1.25 MG (50000 UT) capsule Commonly known as: VITAMIN D2 Take 50,000 Units by mouth once a week.   ferrous sulfate 325 (65 FE) MG tablet take 1 tablet by mouth three times a day with meals What changed: See the new instructions.   finasteride 5 MG tablet Commonly known as: PROSCAR Take 1 tablet (5 mg total) by mouth at bedtime.   lipase/protease/amylase 12000-38000 units Cpep capsule Commonly known as: Creon take 1 capsule by mouth three times a day with meals What changed:   how much to take  how to take this  when to take this  additional instructions   Rivaroxaban 15 MG Tabs tablet Commonly known as: XARELTO Take 1 tablet (15 mg total) by mouth at bedtime. With a meal.  Resume 48 hours after there is no longer visible blood in the urine What changed:   how much to take  additional instructions   tamsulosin 0.4 MG Caps capsule Commonly known as: FLOMAX TAKE 1 CAPSULE(0.4 MG) BY MOUTH DAILY What changed:   how much to take  how to take this  when to take this  additional instructions        Discharge Instructions: Please refer to Patient Instructions section of EMR for full details.  Patient was counseled important signs and symptoms that should prompt return to medical care, changes in medications, dietary instructions, activity restrictions, and follow up appointments.   Follow-Up Appointments:  Contact information for follow-up providers    Ardis Hughs, MD. Schedule an appointment as soon as possible for a visit.   Specialty: Urology Why: ASAP for follow up  Contact information: [address] [address] [phone]            Contact information for after-discharge care    [facility] .   Service: Skilled Nursing Contact information: [address] [place] [phone]                  Stark Klein, MD 06/01/2019, 2:03 PM PGY-1, [place]

## 2019-06-01 NOTE — Discharge Instructions (Signed)
Dear Brian Burns,   Thank you so much for allowing [place] to be part of your care!  You were admitted to [facility] for fever and seizure activity that was likely due to bacteremia.  You were treated with IV antibiotics and will now continue to take Augmentin until 06/06/2019 for the bacteria found in your blood.  Please continue to drink plenty of fluids in order to stay hydrated and help with the small increase in your kidney function number.  These be sure to follow-up with your urologist, Dr. Marlou Porch, in the next few weeks concerning your urethral strictures and BPH.   POST-HOSPITAL & CARE INSTRUCTIONS 1. Please continue to take 10 mg amlodipine daily. 2. Please wait until follow-up with primary care physician for restarting lisinopril and checking creatinine level on BMP. 3. Please make sure to follow-up with your neurologist concerning your urethral strictures and BPH. 4. Please continue to monitor patient blood pressure is suggestion medication titrations as appropriate. 5. Please let PCP/Specialists know of any changes that were made.  6. Please see medications section of this packet for any medication changes.   DOCTOR'S APPOINTMENT & FOLLOW UP CARE INSTRUCTIONS  Future Appointments  Date Time Provider Department Center  06/10/2019 10:00 AM Freddie Breech, DPM [facility]    RETURN PRECAUTIONS: Please return to care if the patient begins to experience seizure activity that lasts for longer than 2 minutes.  Also with the patient becomes confused and has not returned to baseline.  Take care and be well!  Family Medicine Teaching Service Inpatient Team [place]  [facility]  [address] [place], [place] [address] [phone]

## 2019-06-01 NOTE — Progress Notes (Signed)
[name] to be discharged to [facility]  per MD order. Patient verbalized understanding.  Skin clean, dry and intact without evidence of skin break down, no evidence of skin tears noted. IV catheter discontinued intact. Site without signs and symptoms of complications. Dressing and pressure applied. Pt denies pain at the site currently. No complaints noted.  Patient free of lines, drains, and wounds.   Discharge packet assembled. An After Visit Summary (AVS) was printed and given to the EMS personnel. Patient escorted via stretcher and discharged to [facility] via ambulance. Report called to accepting facility; all questions and concerns addressed.   Tried calling report numerous times and never received an answer.  Social Worker Erie Noe called and gave them my number to call me when they could for report.     Shanna Cisco, RN

## 2019-06-01 NOTE — Plan of Care (Signed)

## 2019-06-01 NOTE — TOC Transition Note (Signed)
Transition of Care (TOC) - CM/SW Discharge Note *Discharged to [facility] via Non-emergency ambulance Room 319   Patient Details  Name: Brian Burns MRN: [id number] Date of Birth: 21-Jun-1945  Transition of Care [facility]) CM/SW Contact:  Cristobal Goldmann, LCSW Phone Number: 06/01/2019, 4:00 PM   Clinical Narrative:   Patient discharging today to [facility]. Insurance authorization received today from [employer] OF#[id number]; Plan Auth [id number] effective 5/3 for 3 days; next review date 5/5; Case manager Costella Hatcher and fax number for clinicals [phone].  Daughter [name] contacted [phone] (mobile) and HIPPA compliant message left regarding her father's discharge. Discharge clinicals faxed to [facility] and admissions director Archie Patten indicated that she would contact daughter regarding admissions paperwork.     Final next level of care: Skilled Nursing Facility([facility]) Barriers to Discharge: Barriers Resolved(Insurance auth received 5/4)   Patient Goals and CMS Choice Patient states their goals for this hospitalization and ongoing recovery are:: Family wants patient to get stronger and return home CMS Medicare.gov Compare Post Acute Care list provided to:: Patient Represenative (must comment) Choice offered to / list presented to : Adult Children  Discharge Placement   Existing PASRR number confirmed : 05/27/19          Patient chooses bed at: [facility] Patient to be transferred to facility by: Non-emergency ambulance Name of family member notified: Daughter [name] - [phone] Patient and family notified of of transfer: 06/01/19  Discharge Plan and Services In-house Referral: Clinical Social Work                                   Social Determinants of Health (SDOH) Interventions  No SDOH interventions needed or requested prior to discharge   Readmission  Risk Interventions No flowsheet data found.

## 2019-06-02 ENCOUNTER — Encounter: Payer: Self-pay | Admitting: Adult Health

## 2019-06-02 ENCOUNTER — Non-Acute Institutional Stay (SKILLED_NURSING_FACILITY): Payer: Medicare Other | Admitting: Adult Health

## 2019-06-02 DIAGNOSIS — A419 Sepsis, unspecified organism: Secondary | ICD-10-CM | POA: Diagnosis not present

## 2019-06-02 DIAGNOSIS — R569 Unspecified convulsions: Secondary | ICD-10-CM

## 2019-06-02 DIAGNOSIS — N179 Acute kidney failure, unspecified: Secondary | ICD-10-CM

## 2019-06-02 DIAGNOSIS — I480 Paroxysmal atrial fibrillation: Secondary | ICD-10-CM

## 2019-06-02 DIAGNOSIS — R6520 Severe sepsis without septic shock: Secondary | ICD-10-CM

## 2019-06-02 DIAGNOSIS — N401 Enlarged prostate with lower urinary tract symptoms: Secondary | ICD-10-CM

## 2019-06-02 DIAGNOSIS — K219 Gastro-esophageal reflux disease without esophagitis: Secondary | ICD-10-CM

## 2019-06-02 DIAGNOSIS — R3914 Feeling of incomplete bladder emptying: Secondary | ICD-10-CM

## 2019-06-02 NOTE — Progress Notes (Signed)
Location:  [place] Room Number: 951-O Place of Service:  SNF (31) Provider:  Durenda Age, DNP, FNP-BC  Patient Care Team: Danna Hefty, DO as PCP - General (Family Medicine) Elam Dutch, MD as Consulting Physician (Vascular Surgery) Gardiner Barefoot, DPM as Consulting Physician (Podiatry) Monna Fam, MD as Consulting Physician (Ophthalmology) Ardis Hughs, MD as Attending Physician (Urology) Debara Pickett Nadean Corwin, MD as Consulting Physician (Cardiology) Maurine Cane, LCSW as Social Worker (Licensed Clinical Social Worker)  Extended Emergency Contact Information Primary Emergency Contact: Eliott Nine, [place] [address] [place] Phone: [phone] Mobile Phone: [phone] Relation: Daughter Secondary Emergency Contact: Aragones,Peggy Address: [address]          [place], [place] [address] [place] Phone: [phone] Relation: Spouse  Code Status:  Full Code  Goals of care: Advanced Directive information Advanced Directives 05/25/2019  Does Patient Have a Medical Advance Directive? No  Type of Advance Directive -  Does patient want to make changes to medical advance directive? -  Copy of [place] in Chart? -  Would patient like information on creating a medical advance directive? No - Guardian declined  Pre-existing out of facility DNR order (yellow form or pink MOST form) -     Chief Complaint  Patient presents with  . Acute Visit    Hospital followup, status post admission at [facility] 4/27-06/01/19 for AKI, encephalopathy, sepsis, and seizure.    HPI:  Pt is a 74 y.o. male was admitted to [facility] on  06/01/19 post [facility] hospitalization 05/25/19 to 06/01/19 for bacteremia and seizure activity.  He presented to the hospital with 3 episodes of vomiting, dysuria and seizure-like activity.  Seizure description appears to be  consistent with generalized clonic- tonic seizure lasting <1 minute..  Urinalysis showed hemoglobin, proteinuria and a lactic acid initially > 11.  He was a started on vancomycin/cefepime/Flagyl in ED and given 3L LR boluses.  Urine culture showed no growth.  Blood culture grew gram-negative rods in both anaerobic and aerobic bottles.  After further incubation, culture was positive for Clostridium, anaerobic gram negative rods and actinotignum schaalii.  Infectious disease was consulted and recommended transitioning from Unasyn, after 5 days, to p.o. Augmentin for 7 additional days.  He has a PMH of frequent UTIs, hypertension, hyperlipidemia, anemia, BPH, CVA and PAF on Xarelto.  He was seen in his room today.  He is alert and oriented x3. He complained of acid reflux, no nausea nor vomiting.   Past Medical History:  Diagnosis Date  . AAA (abdominal aortic aneurysm) without rupture ([place]) 06/2014   no change in last exam from 2015  . Achalasia   . Arthritis    knees,lower back  . Atrial fibrillation ([place])   . Atrial flutter with rapid ventricular response ([place]) 01/16/2015  . Bilateral wrist pain 09/09/2014  . Bladder outlet obstruction 01/20/2015  . Bradycardia 08/27/2016  . CAD (coronary artery disease)    pt denies  . Carotid artery disease ([place])   . Chronic back pain   . Chronic kidney disease (CKD), stage III (moderate)   . Decreased visual acuity of left eye  12/02/2018  . Dyspnea    reports today no pregoression  . Erectile dysfunction   . GERD (gastroesophageal reflux disease)   . Headache    hx of  . History of CVA (cerebrovascular accident)  2009  &  2013  . History of Multiple lacunar infarcts (HCC) 01/31/2015   MRI Brain 01/31/2015: Widespread old lacunar infarcts, particularly right Basal ganglia, pons, and right cerebellum  . HLD (hyperlipidemia)   . Hypertension   . Idiopathic pancreatitis 12/2014   acute  . Knee pain, bilateral 04/09/2011  . Malnutrition of moderate  degree 01/25/2015  . Memory impairment    mild dementia per PCP progress note   . Morbid obesity (HCC) 12/03/2018  . Other abnormalities of gait and mobility 12/03/2018  . Pre-diabetes    per pt. no meds  . PVD (peripheral vascular disease) with claudication (HCC)   . Urethral stricture 02/16/2013  . Urge incontinence 12/08/2018   Past Surgical History:  Procedure Laterality Date  . back injections     [facility]  . BALLOON DILATION N/A 12/16/2018   Procedure: URETHRAL BALLOON DILATION;  Surgeon: Crist Fat, MD;  Location: [facility];  Service: Urology;  Laterality: N/A;  . CARDIOVASCULAR STRESS TEST  08-09-2009   mild global hypokinesis,  no ischemia or scar/  ef 41%  . CATARACT EXTRACTION W/ INTRAOCULAR LENS  IMPLANT, BILATERAL  april and may 2014  . CYSTOSCOPY N/A 01/17/2017   Procedure: CYSTOSCOPY FLEXIBLE;  Surgeon: Crist Fat, MD;  Location: [facility];  Service: Urology;  Laterality: N/A;  . CYSTOSCOPY WITH RETROGRADE URETHROGRAM N/A 11/24/2014   Procedure: CYSTOSCOPY WITH RETROGRADE URETHROGRAM;  Surgeon: Crist Fat, MD;  Location: [facility];  Service: Urology;  Laterality: N/A;  . CYSTOSCOPY WITH RETROGRADE URETHROGRAM N/A 12/16/2018   Procedure: CYSTOSCOPY WITH RETROGRADE URETHROGRAM;  Surgeon: Crist Fat, MD;  Location: [facility];  Service: Urology;  Laterality: N/A;  . CYSTOSCOPY WITH URETHRAL DILATATION N/A 11/24/2014   Procedure: CYSTOSCOPY WITH URETHRAL DILATATION;  Surgeon: Crist Fat, MD;  Location: [facility];  Service: Urology;  Laterality: N/A;  BALLOON DILATION        . ESOPHAGOGASTRODUODENOSCOPY N/A 05/15/2014   Procedure: ESOPHAGOGASTRODUODENOSCOPY (EGD);  Surgeon: Carman Ching, MD;  Location: [facility];  Service: Endoscopy;  Laterality: N/A;  . ESOPHAGOGASTRODUODENOSCOPY Left 02/02/2015   Procedure: ESOPHAGOGASTRODUODENOSCOPY (EGD);  Surgeon: Dorena Cookey, MD;  Location: [facility];   Service: Endoscopy;  Laterality: Left;  . ESOPHAGOGASTRODUODENOSCOPY Left 03/16/2015   Procedure: ESOPHAGOGASTRODUODENOSCOPY (EGD);  Surgeon: Dorena Cookey, MD;  Location: [facility];  Service: Endoscopy;  Laterality: Left;  . TRANSTHORACIC ECHOCARDIOGRAM  01-07-2012   mild to moderate dilated LV,  ef 45-50%,  mild basal hypokinesis,  grade I diastolic  dysfunction/  trivial AR/  mild MR  . URETHROPLASTY N/A 01/17/2017   Procedure: URETHEROPLASTY BUCCAL GRAFT AND BUCCAL MUCOSA GRAFT HARVEST;  Surgeon: Crist Fat, MD;  Location: [facility];  Service: Urology;  Laterality: N/A;    Allergies  Allergen Reactions  . Gabapentin Other (See Comments)    Sedation at 300mg  TID    Outpatient Encounter Medications as of 06/02/2019  Medication Sig  . acetaminophen (TYLENOL) 325 MG tablet Take 325 mg by mouth every 6 (six) hours as needed for moderate pain.  08/02/2019 amLODipine (NORVASC) 10 MG tablet Take 1 tablet (10 mg total) by mouth daily.  Marland Kitchen amoxicillin-clavulanate (AUGMENTIN) 875-125 MG tablet Take 1 tablet by mouth every 12 (twelve) hours for 5 days.  Marland Kitchen atorvastatin (LIPITOR) 40 MG tablet Take 1 tablet (40 mg total) by mouth daily.  . citalopram (CELEXA) 40 MG tablet Take 1 tablet (40 mg total) by mouth at bedtime.  . ergocalciferol (VITAMIN  D2) 1.25 MG (50000 UT) capsule Take 50,000 Units by mouth once a week.  . ferrous sulfate 325 (65 FE) MG tablet take 1 tablet by mouth three times a day with meals  . finasteride (PROSCAR) 5 MG tablet Take 1 tablet (5 mg total) by mouth at bedtime.  . lipase/protease/amylase (CREON) 12000 units CPEP capsule take 1 capsule by mouth three times a day with meals  . Rivaroxaban (XARELTO) 15 MG TABS tablet Take 1 tablet (15 mg total) by mouth at bedtime. With a meal.  Resume 48 hours after there is no longer visible blood in the urine  . tamsulosin (FLOMAX) 0.4 MG CAPS capsule TAKE 1 CAPSULE(0.4 MG) BY MOUTH DAILY   No facility-administered encounter medications on  file as of 06/02/2019.    Review of Systems  GENERAL: No change in appetite, no fatigue, no weight changes, no fever, chills or weakness MOUTH and THROAT: Denies oral discomfort, gingival pain or bleeding RESPIRATORY: no cough, SOB, DOE, wheezing, hemoptysis CARDIAC: No chest pain, edema or palpitations GI: +heartburn GU: Denies dysuria, frequency, hematuria, incontinence, or discharge NEUROLOGICAL: Denies dizziness, syncope, numbness, or headache PSYCHIATRIC: Denies feelings of depression or anxiety. No report of hallucinations, insomnia, paranoia, or agitation   Immunization History  Administered Date(s) Administered  . Influenza,inj,Quad PF,6+ Mos 02/16/2013, 10/21/2013, 01/16/2015, 10/09/2015, 11/24/2017, 10/29/2018  . Pneumococcal Conjugate-13 07/20/2015  . Pneumococcal Polysaccharide-23 11/24/2017  . Td 05/29/2002  . Zoster Recombinat (Shingrix) 08/28/2016, 01/15/2018, 03/26/2018   Pertinent  Health Maintenance Due  Topic Date Due  . COLONOSCOPY  10/28/2012  . OPHTHALMOLOGY EXAM  07/29/2014  . INFLUENZA VACCINE  08/29/2019  . URINE MICROALBUMIN  10/29/2019  . LIPID PANEL  11/24/2019  . HEMOGLOBIN A1C  11/24/2019  . FOOT EXAM  03/11/2020  . PNA vac Low Risk Adult  Completed   Fall Risk  12/03/2018 10/29/2018 06/17/2018 04/15/2018 04/08/2018  Falls in the past year? 1 1 1 1 1   Number falls in past yr: 1 1 1 1 1   Injury with Fall? 0 0 0 0 0  Risk for fall due to : Impaired balance/gait;History of fall(s);Impaired mobility - History of fall(s) - -  Follow up - - Education provided;Falls prevention discussed - -     Vitals:   06/02/19 0824  BP: 120/74  Pulse: (!) 55  Resp: 20  Temp: 97.7 F (36.5 C)  TempSrc: Oral  Weight: 223 lb 5.6 oz (101.3 kg)  Height: 6\' 4"  (1.93 m)   Body mass index is 27.19 kg/m.  Physical Exam  GENERAL APPEARANCE: Well nourished. In no acute distress. Normal body habitus SKIN:  Skin is warm and dry.  MOUTH and THROAT: Lips are without  lesions. Oral mucosa is moist and without lesions. Tongue is normal in shape, size, and color and without lesions RESPIRATORY: Breathing is even & unlabored, BS CTAB CARDIAC: RRR, no murmur,no extra heart sounds, no edema GI: Abdomen soft, normal BS, no masses, no tenderness EXTREMITIES:  Able to move X 4 extremities NEUROLOGICAL: There is no tremor. Speech is clear. Alert and oriented X 3. PSYCHIATRIC:  Affect and behavior are appropriate  Labs reviewed: Recent Labs    05/29/19 0439 05/30/19 0540 06/01/19 0343  NA 136 135 134*  K 3.4* 4.2 3.6  CL 103 103 102  CO2 26 21* 25  GLUCOSE 111* 106* 98  BUN 9 11 14   CREATININE 1.35* 1.45* 1.39*  CALCIUM 8.6* 8.6* 8.5*   Recent Labs    05/25/19 0115 05/30/19 0540  AST 28 31  ALT 20 23  ALKPHOS 109 80  BILITOT 0.6 0.5  PROT 6.9 6.0*  ALBUMIN 3.5 2.8*   Recent Labs    05/25/19 0115 05/25/19 0124 05/28/19 1152 05/28/19 1152 05/30/19 0540 05/31/19 0505 06/01/19 0343  WBC 7.0   < > 5.0   < > 4.6 4.7 4.6  NEUTROABS 5.1  --  2.9  --   --   --  2.0  HGB 11.5*   < > 10.5*   < > 11.1* 10.5* 10.4*  HCT 37.6*   < > 32.0*   < > 34.9* 32.7* 31.7*  MCV 92.8   < > 88.2   < > 90.4 89.1 88.1  PLT 128*   < > 98*   < > 105* 108* 120*   < > = values in this interval not displayed.   Lab Results  Component Value Date   TSH 1.148 03/16/2015   Lab Results  Component Value Date   HGBA1C 6.7 (H) 05/25/2019   Lab Results  Component Value Date   CHOL 131 05/25/2019   HDL 36 (L) 05/25/2019   LDLCALC 79 05/25/2019   LDLDIRECT 175 (H) 04/09/2011   TRIG 80 05/25/2019   CHOLHDL 3.6 05/25/2019    Significant Diagnostic Results in last 30 days:  CT HEAD WO CONTRAST  Result Date: 05/25/2019 CLINICAL DATA:  Seizure EXAM: CT HEAD WITHOUT CONTRAST TECHNIQUE: Contiguous axial images were obtained from the base of the skull through the vertex without intravenous contrast. COMPARISON:  Head CT 01/24/2015 FINDINGS: Brain: There is no mass,  hemorrhage or extra-axial collection. There is generalized atrophy without lobar predilection. Hypodensity of the white matter is most commonly associated with chronic microvascular disease. There are old bilateral basal ganglia small vessel infarcts. Vascular: Atherosclerotic calcification of the vertebral and internal carotid arteries at the skull base. No abnormal hyperdensity of the major intracranial arteries or dural venous sinuses. Skull: The visualized skull base, calvarium and extracranial soft tissues are normal. Sinuses/Orbits: No fluid levels or advanced mucosal thickening of the visualized paranasal sinuses. No mastoid or middle ear effusion. The orbits are normal. IMPRESSION: Chronic microvascular ischemia and generalized atrophy without acute intracranial abnormality. Electronically Signed   By: Deatra Robinson M.D.   On: 05/25/2019 02:26   DG Chest Port 1 View  Result Date: 05/25/2019 CLINICAL DATA:  Fever. EXAM: PORTABLE CHEST 1 VIEW COMPARISON:  July 14, 2017 FINDINGS: Mild, stable increased lung markings are seen involving predominantly the bilateral lung bases. There is no evidence of acute infiltrate, pleural effusion or pneumothorax. The heart size and mediastinal contours are within normal limits. There is mild calcification of the aortic arch. The visualized skeletal structures are unremarkable. IMPRESSION: No active disease. Electronically Signed   By: Aram Candela M.D.   On: 05/25/2019 01:31    Assessment/Plan  1. Sepsis with acute renal failure without septic shock, due to unspecified organism, unspecified acute renal failure type (HCC) -  was a started on vancomycin/cefepime/Flagyl in ED and given 3L LR boluses.  Urine culture showed no growth.  Blood culture grew gram-negative rods in both anaerobic and aerobic bottles.  After further incubation, culture was positive for Clostridium, anaerobic gram negative rods and actinotignum schaalii.  Infectious disease was consulted  and recommended transitioning from Unasyn, after 5 days, to p.o. Augmentin for 7 additional days.  2. Seizure (HCC) -Appeared to be consistent with generalized clonic tonic seizure lasting <1 minute, no further episodes, thought to be likely to  to acute illness -Family decided not to pursue any further neurological work-up   3. Benign prostatic hyperplasia with incomplete bladder emptying -Continue tamsulosin and finasteride  4. Gastroesophageal reflux disease without esophagitis -Complained of acid reflux, will start on omeprazole 20 mg daily  5. Paroxysmal atrial fibrillation (HCC) -Rate controlled, continue Xarelto  -Not on any rate controller, monitor HRs    Family/ staff Communication: Discussed plan of care with resident and charge nurse.  Labs/tests ordered: None  Goals of care:   Short-term care   Kenard Gower, DNP, FNP-BC [facility] [phone] (Monday-Friday 8:00 a.m. - 5:00 p.m.) [phone] (after hours)

## 2019-06-03 ENCOUNTER — Encounter: Payer: Self-pay | Admitting: Internal Medicine

## 2019-06-03 ENCOUNTER — Non-Acute Institutional Stay (SKILLED_NURSING_FACILITY): Payer: Medicare Other | Admitting: Internal Medicine

## 2019-06-03 DIAGNOSIS — N179 Acute kidney failure, unspecified: Secondary | ICD-10-CM | POA: Diagnosis not present

## 2019-06-03 DIAGNOSIS — N35119 Postinfective urethral stricture, not elsewhere classified, male, unspecified: Secondary | ICD-10-CM | POA: Diagnosis not present

## 2019-06-03 DIAGNOSIS — R569 Unspecified convulsions: Secondary | ICD-10-CM

## 2019-06-03 DIAGNOSIS — A419 Sepsis, unspecified organism: Secondary | ICD-10-CM

## 2019-06-03 DIAGNOSIS — R6520 Severe sepsis without septic shock: Secondary | ICD-10-CM | POA: Diagnosis not present

## 2019-06-03 NOTE — Patient Instructions (Signed)
See assessment and plan under each diagnosis in the problem list and acutely for this visit 

## 2019-06-03 NOTE — Assessment & Plan Note (Addendum)
No seizure activity reported @ SNF. ?

## 2019-06-03 NOTE — Assessment & Plan Note (Addendum)
He denies difficulty urinating @ this time Urology follow-up as outpatient post discharge from the SNF

## 2019-06-03 NOTE — Progress Notes (Signed)
NURSING HOME LOCATION:  [facility] ROOM NUMBER:  [id number]  CODE STATUS:  Full Code  PCP:  Mullis, Kiersten P, DO  [address] [place] [address]  This is a comprehensive admission note to [facility] performed on this date less than 30 days from date of admission. Included are preadmission medical/surgical history; reconciled medication list; family history; social history and comprehensive review of systems.  Corrections and additions to the records were documented. Comprehensive physical exam was also performed. Additionally a clinical summary was entered for each active diagnosis pertinent to this admission in the Problem List to enhance continuity of care.  HPI: Patient was hospitalized 4/27-06/01/2019 presenting with fever, seizure-like activity, and vomiting in the context of history of recurrent UTIs and urethral stricture. 3 of 4 sepsis criteria were met; he had no leukocytosis.  Urinalysis revealed LE, blood, and protein.  Lactic acid initially was over 11.  Empirically he received vancomycin, cefepime and Flagyl empirically along with 3 L of Ringer's lactate boli.  Urine culture revealed no growth but blood cultures grew gram-negative rods in both anaerobic and aerobic bottles.  After further incubation culture revealed Clostridium, anaerobic gram-negative rods and actinotignum schaalii.  Informal consultation with ID resulted in transition from Unasyn after 5 days to Augmentin for course of 7 days.  PTA EMS had noted generalized clonic-tonic seizure activity lasting less than 1 minute.  This did resolve spontaneously.  CT revealed chronic microvascular ischemia and generalized atrophy without acute intracranial abnormality.  There were no additional seizures during his hospitalization.  Family decided not to pursue any further neurologic work-up.  Seizure activity was attributed to the sepsis. AKI was present with creatinine up to greater than 2.  At discharge  value was 1.39.  Lisinopril was discontinued due to the AKI and amlodipine titrated to 10 mg. Augmentin will be continued until 5/9.  Urology follow-up for urethral stricture as an outpatient was recommended.  Past medical and surgical history: Includes dyslipidemia, vitamin D deficiency, iron deficiency anemia, symptomatic BPH with history of urethral stricture, chronic pancreatitis, PVD with claudication, essential hypertension, history of multiple lacunar infarcts complicated by dementia, history of stroke, GERD, CAD, history of AF with RVR, and history of AAA.  Surgeries and procedures include multiple cystoscopies and to include urethral dilation, multiple esophagoscopies, and urethroplasty,  Social history: Nondrinker; former 20-pack-year smoker(1/2 pack/day x 40 years).  Family history: Reviewed   Review of systems:  Date given as "June ?, 2021".  He could not tell me why he had been in the hospital.  He does describe occasional "burning" when he urinates.  He told me that the condom catheter had been removed when he got to the SNF. He also describes occasional bitemporal headaches which are relieved with Tylenol.  Otherwise extensive review of systems was answered as "not lately".  Constitutional: No fever, significant weight change, fatigue  Eyes: No redness, discharge, pain, vision change ENT/mouth: No nasal congestion, purulent discharge, earache, change in hearing, sore throat  Cardiovascular: No chest pain, palpitations, paroxysmal nocturnal dyspnea, claudication, edema  Respiratory: No cough, sputum production, hemoptysis, DOE, significant snoring, apnea Gastrointestinal: No heartburn, dysphagia, abdominal pain, nausea /vomiting, rectal bleeding, melena, change in bowels Genitourinary: No  hematuria, pyuria, incontinence, nocturia Musculoskeletal: No joint stiffness, joint swelling, weakness, pain Dermatologic: No rash, pruritus, change in appearance of skin Neurologic: No  dizziness,  syncope, numbness, tingling Psychiatric: No significant anxiety, depression, insomnia, anorexia Endocrine: No change in hair/skin/nails, excessive thirst, excessive hunger, excessive  urination  Hematologic/lymphatic: No significant bruising, lymphadenopathy, abnormal bleeding Allergy/immunology: No itchy/watery eyes, significant sneezing, urticaria, angioedema  Physical exam:  Pertinent or positive findings: He appears somewhat chronically ill.  He has pattern alopecia.  He has a full beard and mustache.  Arcus senilis is present.  He is completely edentulous.  Breath sounds are decreased.  He does have some mild rhonchi on expiration.  First and second heart sounds are accentuated.  There is no suprapubic distention or tenderness.  Pedal pulses are decreased.  He is strong to opposition in all extremities.  General appearance: no acute distress, increased work of breathing is present.   Lymphatic: No lymphadenopathy about the head, neck, axilla. Eyes: No conjunctival inflammation or lid edema is present. There is no scleral icterus. Ears:  External ear exam shows no significant lesions or deformities.   Nose:  External nasal examination shows no deformity or inflammation. Nasal mucosa are pink and moist without lesions, exudates Oral exam: Lips and gums are healthy appearing.There is no oropharyngeal erythema or exudate. Neck:  No thyromegaly, masses, tenderness noted.    Heart:  Normal rate and regular rhythm without gallop, murmur, click, rub.  Lungs:  without wheezes, rales, rubs. Abdomen: Bowel sounds are normal.  Abdomen is soft and nontender with no organomegaly, hernias, masses. GU: Deferred  Extremities:  No cyanosis, clubbing, edema. Neurologic exam: Balance, Rhomberg, finger to nose testing could not be completed due to clinical state Skin: Warm & dry w/o tenting. No significant lesions or rash.  See clinical summary under each active problem in the Problem List with  associated updated therapeutic plan

## 2019-06-03 NOTE — Assessment & Plan Note (Signed)
Continue Augmentin until 06/06/2019

## 2019-06-03 NOTE — Assessment & Plan Note (Addendum)
In context of sepsis, probable Urosepsis, creatinine rose above 2.  It was 1.39 at discharge. BMET will be monitored.

## 2019-06-10 ENCOUNTER — Ambulatory Visit: Payer: Medicare Other | Admitting: Podiatry

## 2019-06-11 ENCOUNTER — Ambulatory Visit: Payer: Medicare Other | Admitting: Podiatry

## 2019-06-14 ENCOUNTER — Encounter: Payer: Self-pay | Admitting: Adult Health

## 2019-06-14 ENCOUNTER — Non-Acute Institutional Stay (SKILLED_NURSING_FACILITY): Payer: Medicare Other | Admitting: Adult Health

## 2019-06-14 DIAGNOSIS — N179 Acute kidney failure, unspecified: Secondary | ICD-10-CM

## 2019-06-14 DIAGNOSIS — K219 Gastro-esophageal reflux disease without esophagitis: Secondary | ICD-10-CM

## 2019-06-14 DIAGNOSIS — A419 Sepsis, unspecified organism: Secondary | ICD-10-CM | POA: Diagnosis not present

## 2019-06-14 DIAGNOSIS — R569 Unspecified convulsions: Secondary | ICD-10-CM

## 2019-06-14 DIAGNOSIS — R6520 Severe sepsis without septic shock: Secondary | ICD-10-CM

## 2019-06-14 DIAGNOSIS — I480 Paroxysmal atrial fibrillation: Secondary | ICD-10-CM | POA: Diagnosis not present

## 2019-06-14 DIAGNOSIS — R3914 Feeling of incomplete bladder emptying: Secondary | ICD-10-CM

## 2019-06-14 DIAGNOSIS — N401 Enlarged prostate with lower urinary tract symptoms: Secondary | ICD-10-CM

## 2019-06-14 NOTE — Progress Notes (Signed)
Location:  [facility] Room Number: 222-B Place of Service:  SNF (31) Provider:  Kenard Gower, DNP, FNP-BC  Patient Care Team: Joana Reamer, DO as PCP - General (Family Medicine) Sherren Kerns, MD as Consulting Physician (Vascular Surgery) Helane Gunther, DPM as Consulting Physician (Podiatry) Mateo Flow, MD as Consulting Physician (Ophthalmology) Crist Fat, MD as Attending Physician (Urology) Rennis Golden Lisette Abu, MD as Consulting Physician (Cardiology) Soundra Pilon, LCSW as Social Worker (Licensed Clinical Social Worker)  Extended Emergency Contact Information Primary Emergency Contact: John Giovanni, [place] [address] [place] Home Phone: [phone] Mobile Phone: [phone] Relation: Daughter Secondary Emergency Contact: Tasker,Peggy Address: [address]          [place], [place] [address] [place] Home Phone: [phone] Relation: Spouse  Code Status:  FULL CODE  Goals of care: Advanced Directive information Advanced Directives 05/25/2019  Does Patient Have a Medical Advance Directive? No  Type of Advance Directive -  Does patient want to make changes to medical advance directive? -  Copy of Healthcare Power of Attorney in Chart? -  Would patient like information on creating a medical advance directive? No - Guardian declined  Pre-existing out of facility DNR order (yellow form or pink MOST form) -     Chief Complaint  Patient presents with  . Medical Management of Chronic Issues    Patient is seen for a routine short-term rehabilitation visit.     HPI:  Pt is a 74 y.o. male seen today for routine short-term rehabilitation visit.  He has a PMH of frequent UTIs, hypertension, hyperlipidemia, anemia, BPH, CVA and paroxysmal atrial fibrillation on Xarelto. He was seen in the room today. No reported seizures. He continues to have PT and OT.  He was admitted to  [facility] on 06/01/19 post [facility] hospitalization 05/25/2019 to 06/01/2019 for bacteremia and seizure activity.  He presented to the hospital with 3 episodes of vomiting, dysuria and seizure-like activity.  Seizure description appears to be consistent with generalized clonic tonic seizure lasting <1 minute.  Urinalysis showed hemoglobin, proteinuria and a lactic acid initially > 11.  He was started on vancomycin/cefepime/Flagyl in ED and given 3 L LR boluses.  Urine culture showed no growth.  Blood culture grew gram-negative rods in both anaerobic and aerobic bottles.  After further incubation, culture was positive for Clostridium, anaerobic gram-negative rods and actinotignum schalii.  Infectious disease was consulted and recommended transitioning from Unasyn, after 5 days, to p.o. Augmentin for 7 additional days.   Past Medical History:  Diagnosis Date  . AAA (abdominal aortic aneurysm) without rupture (HCC) 06/2014   no change in last exam from 2015  . Achalasia   . Arthritis    knees,lower back  . Atrial fibrillation (HCC)   . Atrial flutter with rapid ventricular response (HCC) 01/16/2015  . Bilateral wrist pain 09/09/2014  . Bladder outlet obstruction 01/20/2015  . Bradycardia 08/27/2016  . CAD (coronary artery disease)    pt denies  . Carotid artery disease (HCC)   . Chronic back pain   . Chronic kidney disease (CKD), stage III (moderate)   . Decreased visual acuity of left eye  12/02/2018  . Dyspnea    reports today no pregoression  . Erectile dysfunction   . GERD (gastroesophageal reflux disease)   . Headache    hx of  . History of CVA (cerebrovascular accident)  2009  &  2013  . History of Multiple lacunar infarcts (HCC) 01/31/2015   MRI Brain 01/31/2015: Widespread old lacunar infarcts, particularly right Basal ganglia, pons, and right cerebellum  . HLD (hyperlipidemia)   . Hypertension   . Idiopathic pancreatitis 12/2014   acute  . Knee pain, bilateral  04/09/2011  . Malnutrition of moderate degree 01/25/2015  . Memory impairment    mild dementia per PCP progress note   . Morbid obesity (HCC) 12/03/2018  . Other abnormalities of gait and mobility 12/03/2018  . Pre-diabetes    per pt. no meds  . PVD (peripheral vascular disease) with claudication (HCC)   . Urethral stricture 02/16/2013  . Urge incontinence 12/08/2018   Past Surgical History:  Procedure Laterality Date  . back injections     [facility]  . BALLOON DILATION N/A 12/16/2018   Procedure: URETHRAL BALLOON DILATION;  Surgeon: Crist Fat, MD;  Location: [facility];  Service: Urology;  Laterality: N/A;  . CARDIOVASCULAR STRESS TEST  08-09-2009   mild global hypokinesis,  no ischemia or scar/  ef 41%  . CATARACT EXTRACTION W/ INTRAOCULAR LENS  IMPLANT, BILATERAL  april and may 2014  . CYSTOSCOPY N/A 01/17/2017   Procedure: CYSTOSCOPY FLEXIBLE;  Surgeon: Crist Fat, MD;  Location: [facility];  Service: Urology;  Laterality: N/A;  . CYSTOSCOPY WITH RETROGRADE URETHROGRAM N/A 11/24/2014   Procedure: CYSTOSCOPY WITH RETROGRADE URETHROGRAM;  Surgeon: Crist Fat, MD;  Location: [facility];  Service: Urology;  Laterality: N/A;  . CYSTOSCOPY WITH RETROGRADE URETHROGRAM N/A 12/16/2018   Procedure: CYSTOSCOPY WITH RETROGRADE URETHROGRAM;  Surgeon: Crist Fat, MD;  Location: [facility];  Service: Urology;  Laterality: N/A;  . CYSTOSCOPY WITH URETHRAL DILATATION N/A 11/24/2014   Procedure: CYSTOSCOPY WITH URETHRAL DILATATION;  Surgeon: Crist Fat, MD;  Location: [facility];  Service: Urology;  Laterality: N/A;  BALLOON DILATION        . ESOPHAGOGASTRODUODENOSCOPY N/A 05/15/2014   Procedure: ESOPHAGOGASTRODUODENOSCOPY (EGD);  Surgeon: Carman Ching, MD;  Location: [facility] ENDOSCOPY;  Service: Endoscopy;  Laterality: N/A;  . ESOPHAGOGASTRODUODENOSCOPY Left 02/02/2015   Procedure: ESOPHAGOGASTRODUODENOSCOPY (EGD);  Surgeon:  Dorena Cookey, MD;  Location: [facility] ENDOSCOPY;  Service: Endoscopy;  Laterality: Left;  . ESOPHAGOGASTRODUODENOSCOPY Left 03/16/2015   Procedure: ESOPHAGOGASTRODUODENOSCOPY (EGD);  Surgeon: Dorena Cookey, MD;  Location: [facility];  Service: Endoscopy;  Laterality: Left;  . TRANSTHORACIC ECHOCARDIOGRAM  01-07-2012   mild to moderate dilated LV,  ef 45-50%,  mild basal hypokinesis,  grade I diastolic  dysfunction/  trivial AR/  mild MR  . URETHROPLASTY N/A 01/17/2017   Procedure: URETHEROPLASTY BUCCAL GRAFT AND BUCCAL MUCOSA GRAFT HARVEST;  Surgeon: Crist Fat, MD;  Location: [facility];  Service: Urology;  Laterality: N/A;    Allergies  Allergen Reactions  . Gabapentin Other (See Comments)    Sedation at 300mg  TID    Outpatient Encounter Medications as of 06/14/2019  Medication Sig  . acetaminophen (TYLENOL) 325 MG tablet Take 325 mg by mouth every 6 (six) hours as needed for moderate pain.  06/16/2019 amLODipine (NORVASC) 10 MG tablet Take 1 tablet (10 mg total) by mouth daily.  Marland Kitchen atorvastatin (LIPITOR) 40 MG tablet Take 1 tablet (40 mg total) by mouth daily.  . citalopram (CELEXA) 40 MG tablet Take 1 tablet (40 mg total) by mouth at bedtime.  . ergocalciferol (VITAMIN D2) 1.25 MG (50000 UT) capsule Take 50,000 Units by mouth once a week.  . ferrous sulfate 325 (  65 FE) MG tablet take 1 tablet by mouth three times a day with meals  . finasteride (PROSCAR) 5 MG tablet Take 1 tablet (5 mg total) by mouth at bedtime.  . lipase/protease/amylase (CREON) 12000 units CPEP capsule take 1 capsule by mouth three times a day with meals  . Rivaroxaban (XARELTO) 15 MG TABS tablet Take 1 tablet (15 mg total) by mouth at bedtime. With a meal.  Resume 48 hours after there is no longer visible blood in the urine  . tamsulosin (FLOMAX) 0.4 MG CAPS capsule TAKE 1 CAPSULE(0.4 MG) BY MOUTH DAILY   No facility-administered encounter medications on file as of 06/14/2019.    Review of Systems  GENERAL: No change in  appetite, no fatigue, no weight changes, no fever, chills or weakness MOUTH and THROAT: Denies oral discomfort, gingival pain or bleeding RESPIRATORY: no cough, SOB, DOE, wheezing, hemoptysis CARDIAC: No chest pain, edema or palpitations GI: No abdominal pain, diarrhea, constipation, heart burn, nausea or vomiting GU: Denies dysuria, frequency, hematuria, incontinence, or discharge NEUROLOGICAL: Denies dizziness, syncope, numbness, or headache PSYCHIATRIC: Denies feelings of depression or anxiety. No report of hallucinations, insomnia, paranoia, or agitation .  Immunization History  Administered Date(s) Administered  . Influenza,inj,Quad PF,6+ Mos 02/16/2013, 10/21/2013, 01/16/2015, 10/09/2015, 11/24/2017, 10/29/2018  . Pneumococcal Conjugate-13 07/20/2015  . Pneumococcal Polysaccharide-23 11/24/2017  . Td 05/29/2002  . Zoster Recombinat (Shingrix) 08/28/2016, 01/15/2018, 03/26/2018   Pertinent  Health Maintenance Due  Topic Date Due  . COLONOSCOPY  10/28/2012  . OPHTHALMOLOGY EXAM  07/29/2014  . INFLUENZA VACCINE  08/29/2019  . URINE MICROALBUMIN  10/29/2019  . LIPID PANEL  11/24/2019  . HEMOGLOBIN A1C  11/24/2019  . FOOT EXAM  03/11/2020  . PNA vac Low Risk Adult  Completed   Fall Risk  12/03/2018 10/29/2018 06/17/2018 04/15/2018 04/08/2018  Falls in the past year? 1 1 1 1 1   Number falls in past yr: 1 1 1 1 1   Injury with Fall? 0 0 0 0 0  Risk for fall due to : Impaired balance/gait;History of fall(s);Impaired mobility - History of fall(s) - -  Follow up - - Education provided;Falls prevention discussed - -     Vitals:   06/14/19 1600  BP: 130/83  Pulse: 70  Resp: 18  Temp: (!) 96.5 F (35.8 C)  TempSrc: Oral  Weight: 213 lb 6.4 oz (96.8 kg)  Height: 6\' 4"  (1.93 m)   Body mass index is 25.98 kg/m.  Physical Exam  GENERAL APPEARANCE: Well nourished. In no acute distress. Normal body habitus SKIN:  Skin is warm and dry.  MOUTH and THROAT: Lips are without lesions.  Oral mucosa is moist and without lesions. Tongue is normal in shape, size, and color and without lesions RESPIRATORY: Breathing is even & unlabored, BS CTAB CARDIAC: RRR, no murmur,no extra heart sounds, no edema GI: Abdomen soft, normal BS, no masses, no tenderness NEUROLOGICAL: There is no tremor. Speech is clear. PSYCHIATRIC:  Affect and behavior are appropriate  Labs reviewed: Recent Labs    05/29/19 0439 05/30/19 0540 06/01/19 0343  NA 136 135 134*  K 3.4* 4.2 3.6  CL 103 103 102  CO2 26 21* 25  GLUCOSE 111* 106* 98  BUN 9 11 14   CREATININE 1.35* 1.45* 1.39*  CALCIUM 8.6* 8.6* 8.5*   Recent Labs    05/25/19 0115 05/30/19 0540  AST 28 31  ALT 20 23  ALKPHOS 109 80  BILITOT 0.6 0.5  PROT 6.9 6.0*  ALBUMIN 3.5  2.8*   Recent Labs    05/25/19 0115 05/25/19 0124 05/28/19 1152 05/28/19 1152 05/30/19 0540 05/31/19 0505 06/01/19 0343  WBC 7.0   < > 5.0   < > 4.6 4.7 4.6  NEUTROABS 5.1  --  2.9  --   --   --  2.0  HGB 11.5*   < > 10.5*   < > 11.1* 10.5* 10.4*  HCT 37.6*   < > 32.0*   < > 34.9* 32.7* 31.7*  MCV 92.8   < > 88.2   < > 90.4 89.1 88.1  PLT 128*   < > 98*   < > 105* 108* 120*   < > = values in this interval not displayed.   Lab Results  Component Value Date   TSH 1.148 03/16/2015   Lab Results  Component Value Date   HGBA1C 6.7 (H) 05/25/2019   Lab Results  Component Value Date   CHOL 131 05/25/2019   HDL 36 (L) 05/25/2019   LDLCALC 79 05/25/2019   LDLDIRECT 175 (H) 04/09/2011   TRIG 80 05/25/2019   CHOLHDL 3.6 05/25/2019    Significant Diagnostic Results in last 30 days:  CT HEAD WO CONTRAST  Result Date: 05/25/2019 CLINICAL DATA:  Seizure EXAM: CT HEAD WITHOUT CONTRAST TECHNIQUE: Contiguous axial images were obtained from the base of the skull through the vertex without intravenous contrast. COMPARISON:  Head CT 01/24/2015 FINDINGS: Brain: There is no mass, hemorrhage or extra-axial collection. There is generalized atrophy without lobar  predilection. Hypodensity of the white matter is most commonly associated with chronic microvascular disease. There are old bilateral basal ganglia small vessel infarcts. Vascular: Atherosclerotic calcification of the vertebral and internal carotid arteries at the skull base. No abnormal hyperdensity of the major intracranial arteries or dural venous sinuses. Skull: The visualized skull base, calvarium and extracranial soft tissues are normal. Sinuses/Orbits: No fluid levels or advanced mucosal thickening of the visualized paranasal sinuses. No mastoid or middle ear effusion. The orbits are normal. IMPRESSION: Chronic microvascular ischemia and generalized atrophy without acute intracranial abnormality. Electronically Signed   By: Ulyses Jarred M.D.   On: 05/25/2019 02:26   DG Chest Port 1 View  Result Date: 05/25/2019 CLINICAL DATA:  Fever. EXAM: PORTABLE CHEST 1 VIEW COMPARISON:  July 14, 2017 FINDINGS: Mild, stable increased lung markings are seen involving predominantly the bilateral lung bases. There is no evidence of acute infiltrate, pleural effusion or pneumothorax. The heart size and mediastinal contours are within normal limits. There is mild calcification of the aortic arch. The visualized skeletal structures are unremarkable. IMPRESSION: No active disease. Electronically Signed   By: Virgina Norfolk M.D.   On: 05/25/2019 01:31    Assessment/Plan  1. Sepsis with acute renal failure without septic shock, due to unspecified organism, unspecified acute renal failure type ([place]) - Completed antibiotics  2. Paroxysmal atrial fibrillation (HCC) -Rate controlled, continue Xarelto 15 mg 1 tab at bedtime -Not on any rate controller  3. Benign prostatic hyperplasia with incomplete bladder emptying -Denies urinary retention, continue tamsulosin 0.4 mg 1 capsule daily and finasteride 5 mg 1 tab at bedtime  4. Seizure ([place]) -No recent seizures, not antiseizure medication, monitor for  seizures  5. Gastroesophageal reflux disease without esophagitis -Stable, continue omeprazole 20 mg 1 capsule daily    Family/ staff Communication: Discussed plan of care with resident and charge nurse.  Labs/tests ordered: None  Goals of care:   Short-term care   Durenda Age, DNP, FNP-BC [facility] [phone] (Monday-Friday 8:00 a.m. - 5:00 p.m.) [phone] (after hours)

## 2019-06-15 ENCOUNTER — Other Ambulatory Visit: Payer: Self-pay | Admitting: Adult Health

## 2019-06-15 ENCOUNTER — Encounter: Payer: Self-pay | Admitting: Adult Health

## 2019-06-15 ENCOUNTER — Non-Acute Institutional Stay (SKILLED_NURSING_FACILITY): Payer: Medicare Other | Admitting: Adult Health

## 2019-06-15 DIAGNOSIS — E782 Mixed hyperlipidemia: Secondary | ICD-10-CM

## 2019-06-15 DIAGNOSIS — A419 Sepsis, unspecified organism: Secondary | ICD-10-CM

## 2019-06-15 DIAGNOSIS — K8689 Other specified diseases of pancreas: Secondary | ICD-10-CM

## 2019-06-15 DIAGNOSIS — K219 Gastro-esophageal reflux disease without esophagitis: Secondary | ICD-10-CM

## 2019-06-15 DIAGNOSIS — I480 Paroxysmal atrial fibrillation: Secondary | ICD-10-CM

## 2019-06-15 DIAGNOSIS — R3914 Feeling of incomplete bladder emptying: Secondary | ICD-10-CM

## 2019-06-15 DIAGNOSIS — R6520 Severe sepsis without septic shock: Secondary | ICD-10-CM

## 2019-06-15 DIAGNOSIS — D649 Anemia, unspecified: Secondary | ICD-10-CM

## 2019-06-15 DIAGNOSIS — N179 Acute kidney failure, unspecified: Secondary | ICD-10-CM

## 2019-06-15 DIAGNOSIS — N401 Enlarged prostate with lower urinary tract symptoms: Secondary | ICD-10-CM

## 2019-06-15 DIAGNOSIS — I10 Essential (primary) hypertension: Secondary | ICD-10-CM

## 2019-06-15 DIAGNOSIS — R569 Unspecified convulsions: Secondary | ICD-10-CM | POA: Diagnosis not present

## 2019-06-15 DIAGNOSIS — F339 Major depressive disorder, recurrent, unspecified: Secondary | ICD-10-CM

## 2019-06-15 MED ORDER — ERGOCALCIFEROL 1.25 MG (50000 UT) PO CAPS: 50000.0000 [IU] | ORAL_CAPSULE | ORAL | 0 refills | Status: DC

## 2019-06-15 MED ORDER — OMEPRAZOLE 20 MG PO CPDR: 20.0000 mg | DELAYED_RELEASE_CAPSULE | Freq: Every day | ORAL | 0 refills | Status: DC

## 2019-06-15 MED ORDER — ATORVASTATIN CALCIUM 40 MG PO TABS: 40.0000 mg | ORAL_TABLET | Freq: Every day | ORAL | 0 refills | Status: DC

## 2019-06-15 MED ORDER — CITALOPRAM HYDROBROMIDE 40 MG PO TABS: 40.0000 mg | ORAL_TABLET | Freq: Every day | ORAL | 0 refills | Status: DC

## 2019-06-15 MED ORDER — AMLODIPINE BESYLATE 10 MG PO TABS: 10.0000 mg | ORAL_TABLET | Freq: Every day | ORAL | 0 refills | Status: DC

## 2019-06-15 MED ORDER — FERROUS SULFATE 324 MG PO TBEC: 324.0000 mg | DELAYED_RELEASE_TABLET | Freq: Every day | ORAL | 0 refills | Status: DC

## 2019-06-15 MED ORDER — RIVAROXABAN 15 MG PO TABS: 15.0000 mg | ORAL_TABLET | Freq: Every day | ORAL | 0 refills | Status: DC

## 2019-06-15 MED ORDER — FINASTERIDE 5 MG PO TABS: 5.0000 mg | ORAL_TABLET | Freq: Every day | ORAL | 0 refills | Status: DC

## 2019-06-15 MED ORDER — PANCRELIPASE (LIP-PROT-AMYL) 12000-38000 UNITS PO CPEP: ORAL_CAPSULE | ORAL | 0 refills | Status: DC

## 2019-06-15 MED ORDER — TAMSULOSIN HCL 0.4 MG PO CAPS: ORAL_CAPSULE | ORAL | 0 refills | Status: DC

## 2019-06-15 NOTE — Progress Notes (Signed)
Location:  [facility] Room Number: 222-B Place of Service:  SNF (31) Provider:  Kenard Gower, DNP, FNP-BC  Patient Care Team: Joana Reamer, DO as PCP - General (Family Medicine) Sherren Kerns, MD as Consulting Physician (Vascular Surgery) Helane Gunther, DPM as Consulting Physician (Podiatry) Mateo Flow, MD as Consulting Physician (Ophthalmology) Crist Fat, MD as Attending Physician (Urology) Rennis Golden Lisette Abu, MD as Consulting Physician (Cardiology) Soundra Pilon, LCSW as Social Worker (Licensed Clinical Social Worker)  Extended Emergency Contact Information Primary Emergency Contact: John Giovanni, [place] [address] [place] Home Phone: [phone] Mobile Phone: [phone] Relation: Daughter Secondary Emergency Contact: Mizuno,Peggy Address: [address]          [place], [place] [address] [place] Home Phone: [phone] Relation: Spouse  Code Status:  FULL CODE  Goals of care: Advanced Directive information Advanced Directives 05/25/2019  Does Patient Have a Medical Advance Directive? No  Type of Advance Directive -  Does patient want to make changes to medical advance directive? -  Copy of Healthcare Power of Attorney in Chart? -  Would patient like information on creating a medical advance directive? No - Guardian declined  Pre-existing out of facility DNR order (yellow form or pink MOST form) -     Chief Complaint  Patient presents with   Discharge Note    Patient is seen for discharge from SNF    HPI:  Pt is a 74 y.o. male who is for discharge home with Home health PT and OT.  He was admitted to [facility] on 06/01/19 post [facility] hospitalization 05/25/2019 to 06/01/19 for bacteremia in seizure activity.  He presented to the hospital with 3 episodes of vomiting, dysuria and seizure-like activity.  Seizure description appears to be  consistent with generalized clonic tonic seizure lasting <1 minute.  Urinalysis showed hemoglobin, proteinuria and lactic acid initially >11.  He was started on vancomycin/cefepime/Flagyl in the ED and given 3L LR boluses.  Urine culture showed no growth.  Blood culture grew gram-negative rods in both anaerobic and aerobic bottles.  After further incubation, culture was positive for Clostridium, anaerobic gram-negative rods and actinotignum schalii.  Infectious disease was consulted and recommended transitioning from Unasyn, after 5 days to p.o. Augmentin for 7 additional days. Family decided not to pursue any further neurological work up.   He has a PMH of AAA, achalasia, atrial fibrillation with RVR, CKD, CAD, GERD, hyperlipidemia, hypertension, history of CVA and idiopathic pancreatitis.  Patient was admitted to this facility for short-term rehabilitation after the patient's recent hospitalization.  Patient has completed SNF rehabilitation and therapy has cleared the patient for discharge.   Past Medical History:  Diagnosis Date   AAA (abdominal aortic aneurysm) without rupture (HCC) 06/2014   no change in last exam from 2015   Achalasia    Arthritis    knees,lower back   Atrial fibrillation (HCC)    Atrial flutter with rapid ventricular response (HCC) 01/16/2015   Bilateral wrist pain 09/09/2014   Bladder outlet obstruction 01/20/2015   Bradycardia 08/27/2016   CAD (coronary artery disease)    pt denies   Carotid artery disease (HCC)    Chronic back pain    Chronic kidney disease (CKD), stage III (moderate)    Decreased visual acuity of left eye  12/02/2018   Dyspnea    reports today no pregoression   Erectile dysfunction  GERD (gastroesophageal reflux disease)    Headache    hx of   History of CVA (cerebrovascular accident)    2009  &  2013   History of Multiple lacunar infarcts (HCC) 01/31/2015   MRI Brain 01/31/2015: Widespread old lacunar infarcts, particularly  right Basal ganglia, pons, and right cerebellum   HLD (hyperlipidemia)    Hypertension    Idiopathic pancreatitis 12/2014   acute   Knee pain, bilateral 04/09/2011   Malnutrition of moderate degree 01/25/2015   Memory impairment    mild dementia per PCP progress note    Morbid obesity (HCC) 12/03/2018   Other abnormalities of gait and mobility 12/03/2018   Pre-diabetes    per pt. no meds   PVD (peripheral vascular disease) with claudication (HCC)    Urethral stricture 02/16/2013   Urge incontinence 12/08/2018   Past Surgical History:  Procedure Laterality Date   back injections     [facility]   BALLOON DILATION N/A 12/16/2018   Procedure: URETHRAL BALLOON DILATION;  Surgeon: Crist Fat, MD;  Location: [facility];  Service: Urology;  Laterality: N/A;   CARDIOVASCULAR STRESS TEST  08-09-2009   mild global hypokinesis,  no ischemia or scar/  ef 41%   CATARACT EXTRACTION W/ INTRAOCULAR LENS  IMPLANT, BILATERAL  april and may 2014   CYSTOSCOPY N/A 01/17/2017   Procedure: CYSTOSCOPY FLEXIBLE;  Surgeon: Crist Fat, MD;  Location: [facility];  Service: Urology;  Laterality: N/A;   CYSTOSCOPY WITH RETROGRADE URETHROGRAM N/A 11/24/2014   Procedure: CYSTOSCOPY WITH RETROGRADE URETHROGRAM;  Surgeon: Crist Fat, MD;  Location: [facility];  Service: Urology;  Laterality: N/A;   CYSTOSCOPY WITH RETROGRADE URETHROGRAM N/A 12/16/2018   Procedure: CYSTOSCOPY WITH RETROGRADE URETHROGRAM;  Surgeon: Crist Fat, MD;  Location: [facility];  Service: Urology;  Laterality: N/A;   CYSTOSCOPY WITH URETHRAL DILATATION N/A 11/24/2014   Procedure: CYSTOSCOPY WITH URETHRAL DILATATION;  Surgeon: Crist Fat, MD;  Location: [facility];  Service: Urology;  Laterality: N/A;  BALLOON DILATION         ESOPHAGOGASTRODUODENOSCOPY N/A 05/15/2014   Procedure: ESOPHAGOGASTRODUODENOSCOPY (EGD);  Surgeon: Carman Ching, MD;   Location: [facility]OSCOPY;  Service: Endoscopy;  Laterality: N/A;   ESOPHAGOGASTRODUODENOSCOPY Left 02/02/2015   Procedure: ESOPHAGOGASTRODUODENOSCOPY (EGD);  Surgeon: Dorena Cookey, MD;  Location: [facility]COPY;  Service: Endoscopy;  Laterality: Left;   ESOPHAGOGASTRODUODENOSCOPY Left 03/16/2015   Procedure: ESOPHAGOGASTRODUODENOSCOPY (EGD);  Surgeon: Dorena Cookey, MD;  Location: [facility];  Service: Endoscopy;  Laterality: Left;   TRANSTHORACIC ECHOCARDIOGRAM  01-07-2012   mild to moderate dilated LV,  ef 45-50%,  mild basal hypokinesis,  grade I diastolic  dysfunction/  trivial AR/  mild MR   URETHROPLASTY N/A 01/17/2017   Procedure: URETHEROPLASTY BUCCAL GRAFT AND BUCCAL MUCOSA GRAFT HARVEST;  Surgeon: Crist Fat, MD;  Location: [facility];  Service: Urology;  Laterality: N/A;    Allergies  Allergen Reactions   Gabapentin Other (See Comments)    Sedation at 300mg  TID    Outpatient Encounter Medications as of 06/15/2019  Medication Sig   acetaminophen (TYLENOL) 325 MG tablet Take 325 mg by mouth every 6 (six) hours as needed for moderate pain.   amLODipine (NORVASC) 10 MG tablet Take 1 tablet (10 mg total) by mouth daily.   atorvastatin (LIPITOR) 40 MG tablet Take 1 tablet (40 mg total) by mouth daily.   citalopram (CELEXA) 40 MG tablet Take 1 tablet (40 mg total) by mouth at bedtime.  ergocalciferol (VITAMIN D2) 1.25 MG (50000 UT) capsule Take 1 capsule (50,000 Units total) by mouth once a week.   finasteride (PROSCAR) 5 MG tablet Take 1 tablet (5 mg total) by mouth at bedtime.   lipase/protease/amylase (CREON) 12000-38000 units CPEP capsule take 1 capsule by mouth three times a day with meals   omeprazole (PRILOSEC) 20 MG capsule Take 1 capsule (20 mg total) by mouth daily.   Rivaroxaban (XARELTO) 15 MG TABS tablet Take 1 tablet (15 mg total) by mouth at bedtime. With a meal.   tamsulosin (FLOMAX) 0.4 MG CAPS capsule TAKE 1 CAPSULE(0.4 MG) BY MOUTH DAILY   [DISCONTINUED]  amLODipine (NORVASC) 10 MG tablet Take 1 tablet (10 mg total) by mouth daily.   [DISCONTINUED] atorvastatin (LIPITOR) 40 MG tablet Take 1 tablet (40 mg total) by mouth daily.   [DISCONTINUED] citalopram (CELEXA) 40 MG tablet Take 1 tablet (40 mg total) by mouth at bedtime.   [DISCONTINUED] ergocalciferol (VITAMIN D2) 1.25 MG (50000 UT) capsule Take 50,000 Units by mouth once a week.   [DISCONTINUED] ferrous sulfate 325 (65 FE) MG tablet take 1 tablet by mouth three times a day with meals   [DISCONTINUED] finasteride (PROSCAR) 5 MG tablet Take 1 tablet (5 mg total) by mouth at bedtime.   [DISCONTINUED] lipase/protease/amylase (CREON) 12000 units CPEP capsule take 1 capsule by mouth three times a day with meals   [DISCONTINUED] omeprazole (PRILOSEC) 20 MG capsule Take 20 mg by mouth daily.   [DISCONTINUED] Rivaroxaban (XARELTO) 15 MG TABS tablet Take 1 tablet (15 mg total) by mouth at bedtime. With a meal.  Resume 48 hours after there is no longer visible blood in the urine   [DISCONTINUED] tamsulosin (FLOMAX) 0.4 MG CAPS capsule TAKE 1 CAPSULE(0.4 MG) BY MOUTH DAILY   ferrous sulfate 324 MG TBEC Take 1 tablet (324 mg total) by mouth daily with breakfast.   No facility-administered encounter medications on file as of 06/15/2019.    Review of Systems  GENERAL: No change in appetite, no fatigue, no weight changes, no fever, chills or weakness MOUTH and THROAT: Denies oral discomfort, gingival pain or bleeding, pain from teeth or hoarseness   RESPIRATORY: no cough, SOB, DOE, wheezing, hemoptysis CARDIAC: No chest pain, edema or palpitations GI: No abdominal pain, diarrhea, constipation, heart burn, nausea or vomiting GU: Denies dysuria, frequency, hematuria, incontinence, or discharge NEUROLOGICAL: Denies dizziness, syncope, numbness, or headache PSYCHIATRIC: Denies feelings of depression or anxiety. No report of hallucinations, insomnia, paranoia, or agitation   Immunization History   Administered Date(s) Administered   Influenza,inj,Quad PF,6+ Mos 02/16/2013, 10/21/2013, 01/16/2015, 10/09/2015, 11/24/2017, 10/29/2018   Pneumococcal Conjugate-13 07/20/2015   Pneumococcal Polysaccharide-23 11/24/2017   Td 05/29/2002   Zoster Recombinat (Shingrix) 08/28/2016, 01/15/2018, 03/26/2018   Pertinent  Health Maintenance Due  Topic Date Due   COLONOSCOPY  10/28/2012   OPHTHALMOLOGY EXAM  07/29/2014   INFLUENZA VACCINE  08/29/2019   URINE MICROALBUMIN  10/29/2019   LIPID PANEL  11/24/2019   HEMOGLOBIN A1C  11/24/2019   FOOT EXAM  03/11/2020   PNA vac Low Risk Adult  Completed   Fall Risk  12/03/2018 10/29/2018 06/17/2018 04/15/2018 04/08/2018  Falls in the past year? 1 1 1 1 1   Number falls in past yr: 1 1 1 1 1   Injury with Fall? 0 0 0 0 0  Risk for fall due to : Impaired balance/gait;History of fall(s);Impaired mobility - History of fall(s) - -  Follow up - - Education provided;Falls prevention discussed - -  Vitals:   06/15/19 1043  BP: 117/70  Pulse: (!) 59  Resp: 18  Temp: 98.6 F (37 C)  TempSrc: Oral  Weight: 213 lb 6.4 oz (96.8 kg)  Height: 6\' 4"  (1.93 m)   Body mass index is 25.98 kg/m.  Physical Exam  GENERAL APPEARANCE: Well nourished. In no acute distress. Normal body habitus SKIN:  Skin is warm and dry.  MOUTH and THROAT: Lips are without lesions. Oral mucosa is moist and without lesions. Tongue is normal in shape, size, and color and without lesions RESPIRATORY: Breathing is even & unlabored, BS CTAB CARDIAC: RRR, no murmur,no extra heart sounds, no edema GI: Abdomen soft, normal BS, no masses, no tenderness EXTREMITIES:  Able to move X 4 extremities NEUROLOGICAL: There is no tremor. Speech is clear. Alert and oriented X 3. PSYCHIATRIC:  Affect and behavior are appropriate  Labs reviewed: Recent Labs    05/29/19 0439 05/30/19 0540 06/01/19 0343  NA 136 135 134*  K 3.4* 4.2 3.6  CL 103 103 102  CO2 26 21* 25  GLUCOSE  111* 106* 98  BUN 9 11 14   CREATININE 1.35* 1.45* 1.39*  CALCIUM 8.6* 8.6* 8.5*   Recent Labs    05/25/19 0115 05/30/19 0540  AST 28 31  ALT 20 23  ALKPHOS 109 80  BILITOT 0.6 0.5  PROT 6.9 6.0*  ALBUMIN 3.5 2.8*   Recent Labs    05/25/19 0115 05/25/19 0124 05/28/19 1152 05/28/19 1152 05/30/19 0540 05/31/19 0505 06/01/19 0343  WBC 7.0   < > 5.0   < > 4.6 4.7 4.6  NEUTROABS 5.1  --  2.9  --   --   --  2.0  HGB 11.5*   < > 10.5*   < > 11.1* 10.5* 10.4*  HCT 37.6*   < > 32.0*   < > 34.9* 32.7* 31.7*  MCV 92.8   < > 88.2   < > 90.4 89.1 88.1  PLT 128*   < > 98*   < > 105* 108* 120*   < > = values in this interval not displayed.   Lab Results  Component Value Date   TSH 1.148 03/16/2015   Lab Results  Component Value Date   HGBA1C 6.7 (H) 05/25/2019   Lab Results  Component Value Date   CHOL 131 05/25/2019   HDL 36 (L) 05/25/2019   LDLCALC 79 05/25/2019   LDLDIRECT 175 (H) 04/09/2011   TRIG 80 05/25/2019   CHOLHDL 3.6 05/25/2019    Significant Diagnostic Results in last 30 days:  CT HEAD WO CONTRAST  Result Date: 05/25/2019 CLINICAL DATA:  Seizure EXAM: CT HEAD WITHOUT CONTRAST TECHNIQUE: Contiguous axial images were obtained from the base of the skull through the vertex without intravenous contrast. COMPARISON:  Head CT 01/24/2015 FINDINGS: Brain: There is no mass, hemorrhage or extra-axial collection. There is generalized atrophy without lobar predilection. Hypodensity of the white matter is most commonly associated with chronic microvascular disease. There are old bilateral basal ganglia small vessel infarcts. Vascular: Atherosclerotic calcification of the vertebral and internal carotid arteries at the skull base. No abnormal hyperdensity of the major intracranial arteries or dural venous sinuses. Skull: The visualized skull base, calvarium and extracranial soft tissues are normal. Sinuses/Orbits: No fluid levels or advanced mucosal thickening of the visualized  paranasal sinuses. No mastoid or middle ear effusion. The orbits are normal. IMPRESSION: Chronic microvascular ischemia and generalized atrophy without acute intracranial abnormality. Electronically Signed   By: 05/27/2019  M.D.   On: 05/25/2019 02:26   DG Chest Port 1 View  Result Date: 05/25/2019 CLINICAL DATA:  Fever. EXAM: PORTABLE CHEST 1 VIEW COMPARISON:  July 14, 2017 FINDINGS: Mild, stable increased lung markings are seen involving predominantly the bilateral lung bases. There is no evidence of acute infiltrate, pleural effusion or pneumothorax. The heart size and mediastinal contours are within normal limits. There is mild calcification of the aortic arch. The visualized skeletal structures are unremarkable. IMPRESSION: No active disease. Electronically Signed   By: Aram Candela M.D.   On: 05/25/2019 01:31    Assessment/Plan  1. Sepsis with acute renal failure without septic shock, due to unspecified organism, unspecified acute renal failure type (HCC) - completed antibiotics, resolved  2. Seizure (HCC) - no seizures at [facility] - family decided not to do further neurological work up  3. Benign prostatic hyperplasia with incomplete bladder emptying - finasteride (PROSCAR) 5 MG tablet; Take 1 tablet (5 mg total) by mouth at bedtime.  Dispense: 30 tablet; Refill: 0 - tamsulosin (FLOMAX) 0.4 MG CAPS capsule; TAKE 1 CAPSULE(0.4 MG) BY MOUTH DAILY  Dispense: 30 capsule; Refill: 0  4. Paroxysmal atrial fibrillation (HCC) - Rivaroxaban (XARELTO) 15 MG TABS tablet; Take 1 tablet (15 mg total) by mouth at bedtime. With a meal.  Dispense: 30 tablet; Refill: 0  5. Gastroesophageal reflux disease without esophagitis - omeprazole (PRILOSEC) 20 MG capsule; Take 1 capsule (20 mg total) by mouth daily.  Dispense: 30 capsule; Refill: 0  6. Essential hypertension - amLODipine (NORVASC) 10 MG tablet; Take 1 tablet (10 mg total) by mouth daily.  Dispense: 30 tablet; Refill: 0  7. Normocytic  anemia Lab Results  Component Value Date   HGB 10.4 (L) 06/01/2019   - will decrease FeSO4 325 mg from TID to daily - ferrous sulfate 324 MG TBEC; Take 1 tablet (324 mg total) by mouth daily with breakfast.  Dispense: 30 tablet; Refill: 0  8. Mixed hyperlipidemia - atorvastatin (LIPITOR) 40 MG tablet; Take 1 tablet (40 mg total) by mouth daily.  Dispense: 30 tablet; Refill: 0 Lab Results  Component Value Date   CHOL 131 05/25/2019   HDL 36 (L) 05/25/2019   LDLCALC 79 05/25/2019   LDLDIRECT 175 (H) 04/09/2011   TRIG 80 05/25/2019   CHOLHDL 3.6 05/25/2019    9. Major depression, recurrent, chronic (HCC) - citalopram (CELEXA) 40 MG tablet; Take 1 tablet (40 mg total) by mouth at bedtime.  Dispense: 30 tablet; Refill: 0  10. Pancreatic insufficiency - lipase/protease/amylase (CREON) 12000-38000 units CPEP capsule; take 1 capsule by mouth three times a day with meals  Dispense: 90 capsule; Refill: 0     I have filled out patient's discharge paperwork and e-prescribed medications.  Patient will receive home health PT and OT.  DME provided:   walker  Total discharge time: Greater than 30 minutes Greater than 50% was spent in counseling and coordination of care.  Discharge time involved coordination of the discharge process with social worker, nursing staff and therapy department. Medical justification for home health services/DME verified.    Kenard Gower, DNP, FNP-BC [facility] [phone] (Monday-Friday 8:00 a.m. - 5:00 p.m.) [phone] (after hours)

## 2019-06-16 ENCOUNTER — Telehealth: Payer: Self-pay | Admitting: Internal Medicine

## 2019-06-16 NOTE — Telephone Encounter (Signed)
Called patient 06/16/19 to schedule follow up visit with Dr. Rennis Golden, spoke with wife of patient, patient is currently hospitalized

## 2019-06-17 ENCOUNTER — Other Ambulatory Visit: Payer: Self-pay | Admitting: Adult Health

## 2019-06-17 DIAGNOSIS — R3914 Feeling of incomplete bladder emptying: Secondary | ICD-10-CM

## 2019-06-17 DIAGNOSIS — I10 Essential (primary) hypertension: Secondary | ICD-10-CM

## 2019-06-18 ENCOUNTER — Ambulatory Visit (INDEPENDENT_AMBULATORY_CARE_PROVIDER_SITE_OTHER): Payer: Medicare Other | Admitting: Family Medicine

## 2019-06-18 ENCOUNTER — Encounter: Payer: Self-pay | Admitting: Family Medicine

## 2019-06-18 ENCOUNTER — Other Ambulatory Visit: Payer: Self-pay

## 2019-06-18 VITALS — BP 118/76 | HR 60 | Ht 76.0 in | Wt 215.4 lb

## 2019-06-18 DIAGNOSIS — E1159 Type 2 diabetes mellitus with other circulatory complications: Secondary | ICD-10-CM

## 2019-06-18 DIAGNOSIS — I10 Essential (primary) hypertension: Secondary | ICD-10-CM

## 2019-06-18 MED ORDER — LISINOPRIL 10 MG PO TABS
10.0000 mg | ORAL_TABLET | Freq: Every day | ORAL | 3 refills | Status: DC
Start: 2019-06-18 — End: 2019-06-30

## 2019-06-18 NOTE — Patient Instructions (Addendum)
It was great to see you!  I am glad you are doing so much better after your discharge from the hospital!  Our plans for today:  - Restart Lisinopril at 10mg /day, we will recheck kidney function in one week, please make an appointment upfront for a lab draw in 1 week -Stop Norvasc/amlodipine. - I would like for you to schedule with your PCP in about a month to make sure everything is still going well.  Take care and seek immediate care sooner if you develop any concerns.   Dr. Family Medicine

## 2019-06-18 NOTE — Progress Notes (Signed)
    SUBJECTIVE:   CHIEF COMPLAINT / HPI:   Hospital follow-up Brian Burns is a 74 year old male the presents today for hospital follow-up.  Previously was admitted to the hospital with encephalopathy and seizure-like activity found to be septic.  Patient's antibiotics ended on 06/06/2019.  Patient denies having additional seizure-like activity other than the 1 instance at his prior hospitalization in the setting of sepsis.  AKI/hypertension During the hospitalization patient was noted to have a creatinine greater than 2 which had improved to 1.39 on day of discharge.  Because of the initial elevated creatinine patient's lisinopril was discontinued with amlodipine titrated to 10 mg in its place.  Per discharge summary plan is to recheck BMP and consider restarting lisinopril if appropriate based off creatinine and patient's blood pressure.  Patient states he previously noticed he was dizzy when attempting to stand up and recently had an appointment with Dr. Parke Simmers who reduced his lisinopril to from 20 mg/day to 10 mg/day prior to his developing sepsis and being admitted to the hospital.  Patient states he is unsure if the 10 mg/day dose reduced his dizziness as he became ill soon after this however he did not take his lisinopril nor his amlodipine the past day or 2 and has noted no dizziness during this time.  PERTINENT  PMH / PSH: Recently admitted for sepsis with 1 instance of seizure occurring in the presence of this.  OBJECTIVE:   BP 118/76   Pulse 60   Ht 6\' 4"  (1.93 m)   Wt 215 lb 6.4 oz (97.7 kg)   SpO2 98%   BMI 26.22 kg/m    General: NAD, pleasant, able to participate in exam Cardiac: RRR, no murmurs. Respiratory: CTAB Neuro: Alert, CN II through XII grossly intact, strength 5/5 in upper and lower extremities bilaterally, fine touch sensation intact in upper and lower extremities bilaterally, no obvious focal deficits Psych: Normal affect and mood  ASSESSMENT/PLAN:   Hypertension  associated with diabetes (HCC) Assessment: Well-controlled blood pressure this morning at 118/76.  Patient had not taken his amlodipine last night as he was unsure about the fact that he still had some lisinopril on hand and was told not to take this.  Patient denied taking either the lisinopril or the amlodipine last night and blood pressure was really good.  Patient states that he had not had any dizziness when he was not taking either blood pressure medication. Plan: -Restarted lisinopril at 10 mg/day -Plan to recheck BMP in 1 week after restarting lisinopril -Stopped amlodipine as patient's last creatinine at day of discharge had improved to baseline -PCP can consider reducing lisinopril to 5 mg/day if patient continues to have symptoms of orthostatic dizziness as he states he was not dizzy when he was taken neither lisinopril nor amlodipine.  Patient plans to follow-up with PCP in approximately 4 weeks to ensure blood pressure well controlled and dizziness has improved.  , DO [facility]

## 2019-06-18 NOTE — Assessment & Plan Note (Addendum)
Assessment: Well-controlled blood pressure this morning at 118/76.  Patient had not taken his amlodipine last night as he was unsure about the fact that he still had some lisinopril on hand and was told not to take this.  Patient denied taking either the lisinopril or the amlodipine last night and blood pressure was really good.  Patient states that he had not had any dizziness when he was not taking either blood pressure medication. Plan: -Restarted lisinopril at 10 mg/day -Plan to recheck BMP in 1 week after restarting lisinopril -Stopped amlodipine as patient's last creatinine at day of discharge had improved to baseline -PCP can consider reducing lisinopril to 5 mg/day if patient continues to have symptoms of orthostatic dizziness as he states he was not dizzy when he was taken neither lisinopril nor amlodipine.

## 2019-06-23 ENCOUNTER — Telehealth: Payer: Self-pay

## 2019-06-23 DIAGNOSIS — E1140 Type 2 diabetes mellitus with diabetic neuropathy, unspecified: Secondary | ICD-10-CM | POA: Diagnosis not present

## 2019-06-23 DIAGNOSIS — E1122 Type 2 diabetes mellitus with diabetic chronic kidney disease: Secondary | ICD-10-CM | POA: Diagnosis not present

## 2019-06-23 DIAGNOSIS — I739 Peripheral vascular disease, unspecified: Secondary | ICD-10-CM | POA: Diagnosis not present

## 2019-06-23 DIAGNOSIS — I129 Hypertensive chronic kidney disease with stage 1 through stage 4 chronic kidney disease, or unspecified chronic kidney disease: Secondary | ICD-10-CM | POA: Diagnosis not present

## 2019-06-23 DIAGNOSIS — N183 Chronic kidney disease, stage 3 unspecified: Secondary | ICD-10-CM | POA: Diagnosis not present

## 2019-06-23 DIAGNOSIS — M6281 Muscle weakness (generalized): Secondary | ICD-10-CM | POA: Diagnosis not present

## 2019-06-23 DIAGNOSIS — I69318 Other symptoms and signs involving cognitive functions following cerebral infarction: Secondary | ICD-10-CM | POA: Diagnosis not present

## 2019-06-23 NOTE — Telephone Encounter (Signed)
Will, HH OT, LVM on nurse line requesting VO for [facility] OT as follows.  0x a week for 1 week  1x a week for 1 week  2x a week for 2 weeks  You may leave verbals on a secure VM [phone].

## 2019-06-23 NOTE — Telephone Encounter (Signed)
Almira from [facility] calling for PT verbal orders as follows:  2 time(s) weekly for 1 week(s), then 1 time(s) weekly for 1 week(s), then 2 times weekly for 2 weeks for strength, balance and gait.   You can leave verbal orders on confidential voicemail at [phone]  Veronda Prude, RN

## 2019-06-23 NOTE — Telephone Encounter (Signed)
Please call in verbal orders as written out below. Thank you!

## 2019-06-23 NOTE — Telephone Encounter (Signed)
Called Almira and gave verbal orders per Dr. Mauri Reading.  Glennie Hawk, CMA

## 2019-06-24 NOTE — Telephone Encounter (Signed)
Agree with orders. Please provide verbal orders as listed below. Thank you.

## 2019-06-24 NOTE — Telephone Encounter (Signed)
Called and spoke to Will and gave verbal orders per Dr. Mauri Reading.  Glennie Hawk, CMA

## 2019-06-25 DIAGNOSIS — N35011 Post-traumatic bulbous urethral stricture: Secondary | ICD-10-CM | POA: Diagnosis not present

## 2019-06-25 DIAGNOSIS — I739 Peripheral vascular disease, unspecified: Secondary | ICD-10-CM | POA: Diagnosis not present

## 2019-06-25 DIAGNOSIS — I69318 Other symptoms and signs involving cognitive functions following cerebral infarction: Secondary | ICD-10-CM | POA: Diagnosis not present

## 2019-06-25 DIAGNOSIS — I129 Hypertensive chronic kidney disease with stage 1 through stage 4 chronic kidney disease, or unspecified chronic kidney disease: Secondary | ICD-10-CM | POA: Diagnosis not present

## 2019-06-25 DIAGNOSIS — E1140 Type 2 diabetes mellitus with diabetic neuropathy, unspecified: Secondary | ICD-10-CM | POA: Diagnosis not present

## 2019-06-25 DIAGNOSIS — M6281 Muscle weakness (generalized): Secondary | ICD-10-CM | POA: Diagnosis not present

## 2019-06-25 DIAGNOSIS — N183 Chronic kidney disease, stage 3 unspecified: Secondary | ICD-10-CM | POA: Diagnosis not present

## 2019-06-25 DIAGNOSIS — E1122 Type 2 diabetes mellitus with diabetic chronic kidney disease: Secondary | ICD-10-CM | POA: Diagnosis not present

## 2019-06-29 ENCOUNTER — Other Ambulatory Visit: Payer: Medicare Other

## 2019-06-29 ENCOUNTER — Other Ambulatory Visit: Payer: Self-pay

## 2019-06-29 DIAGNOSIS — E1159 Type 2 diabetes mellitus with other circulatory complications: Secondary | ICD-10-CM

## 2019-06-29 DIAGNOSIS — I10 Essential (primary) hypertension: Secondary | ICD-10-CM | POA: Diagnosis not present

## 2019-06-30 ENCOUNTER — Other Ambulatory Visit: Payer: Medicare Other

## 2019-06-30 ENCOUNTER — Other Ambulatory Visit: Payer: Self-pay | Admitting: Family Medicine

## 2019-06-30 DIAGNOSIS — I69318 Other symptoms and signs involving cognitive functions following cerebral infarction: Secondary | ICD-10-CM | POA: Diagnosis not present

## 2019-06-30 DIAGNOSIS — N183 Chronic kidney disease, stage 3 unspecified: Secondary | ICD-10-CM | POA: Diagnosis not present

## 2019-06-30 DIAGNOSIS — E1159 Type 2 diabetes mellitus with other circulatory complications: Secondary | ICD-10-CM

## 2019-06-30 DIAGNOSIS — E1122 Type 2 diabetes mellitus with diabetic chronic kidney disease: Secondary | ICD-10-CM | POA: Diagnosis not present

## 2019-06-30 DIAGNOSIS — I129 Hypertensive chronic kidney disease with stage 1 through stage 4 chronic kidney disease, or unspecified chronic kidney disease: Secondary | ICD-10-CM | POA: Diagnosis not present

## 2019-06-30 DIAGNOSIS — E1140 Type 2 diabetes mellitus with diabetic neuropathy, unspecified: Secondary | ICD-10-CM | POA: Diagnosis not present

## 2019-06-30 DIAGNOSIS — M6281 Muscle weakness (generalized): Secondary | ICD-10-CM | POA: Diagnosis not present

## 2019-06-30 DIAGNOSIS — I739 Peripheral vascular disease, unspecified: Secondary | ICD-10-CM | POA: Diagnosis not present

## 2019-06-30 LAB — BASIC METABOLIC PANEL
BUN/Creatinine Ratio: 10 (ref 10–24)
BUN: 19 mg/dL (ref 8–27)
CO2: 20 mmol/L (ref 20–29)
Calcium: 8.8 mg/dL (ref 8.6–10.2)
Chloride: 103 mmol/L (ref 96–106)
Creatinine, Ser: 1.85 mg/dL — ABNORMAL HIGH (ref 0.76–1.27)
GFR calc Af Amer: 41 mL/min/{1.73_m2} — ABNORMAL LOW (ref >59)
GFR calc non Af Amer: 36 mL/min/{1.73_m2} — ABNORMAL LOW (ref >59)
Glucose: 103 mg/dL — ABNORMAL HIGH (ref 65–99)
Potassium: 4 mmol/L (ref 3.5–5.2)
Sodium: 137 mmol/L (ref 134–144)

## 2019-06-30 MED ORDER — LISINOPRIL 5 MG PO TABS
5.0000 mg | ORAL_TABLET | Freq: Every day | ORAL | 3 refills | Status: DC
Start: 2019-06-30 — End: 2020-05-23

## 2019-06-30 NOTE — Progress Notes (Signed)
Creatinine with a bump from 1.39 to 1.85 one week after initiating lisinopril 10 mg.  Due to his bump in creatinine will reduce lisinopril to 5 mg/day and recheck BMP approximately 1 week after this new dose to monitor for AKI.  Called patient and discussed this with him.  Patient gave permission to also call his daughter, Lynden Ang who manages his medications.  I called Lynden Ang discussed the plan including reducing his lisinopril to 5 mg/day and getting follow-up labs which have been ordered in approximately 1 week after starting lisinopril at 5 mg.

## 2019-07-05 DIAGNOSIS — I739 Peripheral vascular disease, unspecified: Secondary | ICD-10-CM | POA: Diagnosis not present

## 2019-07-05 DIAGNOSIS — E1122 Type 2 diabetes mellitus with diabetic chronic kidney disease: Secondary | ICD-10-CM | POA: Diagnosis not present

## 2019-07-05 DIAGNOSIS — M6281 Muscle weakness (generalized): Secondary | ICD-10-CM | POA: Diagnosis not present

## 2019-07-05 DIAGNOSIS — I129 Hypertensive chronic kidney disease with stage 1 through stage 4 chronic kidney disease, or unspecified chronic kidney disease: Secondary | ICD-10-CM | POA: Diagnosis not present

## 2019-07-05 DIAGNOSIS — E1140 Type 2 diabetes mellitus with diabetic neuropathy, unspecified: Secondary | ICD-10-CM | POA: Diagnosis not present

## 2019-07-05 DIAGNOSIS — I69318 Other symptoms and signs involving cognitive functions following cerebral infarction: Secondary | ICD-10-CM | POA: Diagnosis not present

## 2019-07-05 DIAGNOSIS — N183 Chronic kidney disease, stage 3 unspecified: Secondary | ICD-10-CM | POA: Diagnosis not present

## 2019-07-07 DIAGNOSIS — I129 Hypertensive chronic kidney disease with stage 1 through stage 4 chronic kidney disease, or unspecified chronic kidney disease: Secondary | ICD-10-CM | POA: Diagnosis not present

## 2019-07-07 DIAGNOSIS — E1140 Type 2 diabetes mellitus with diabetic neuropathy, unspecified: Secondary | ICD-10-CM | POA: Diagnosis not present

## 2019-07-07 DIAGNOSIS — N183 Chronic kidney disease, stage 3 unspecified: Secondary | ICD-10-CM | POA: Diagnosis not present

## 2019-07-07 DIAGNOSIS — M6281 Muscle weakness (generalized): Secondary | ICD-10-CM | POA: Diagnosis not present

## 2019-07-07 DIAGNOSIS — I739 Peripheral vascular disease, unspecified: Secondary | ICD-10-CM | POA: Diagnosis not present

## 2019-07-07 DIAGNOSIS — I69318 Other symptoms and signs involving cognitive functions following cerebral infarction: Secondary | ICD-10-CM | POA: Diagnosis not present

## 2019-07-07 DIAGNOSIS — E1122 Type 2 diabetes mellitus with diabetic chronic kidney disease: Secondary | ICD-10-CM | POA: Diagnosis not present

## 2019-07-08 DIAGNOSIS — E1140 Type 2 diabetes mellitus with diabetic neuropathy, unspecified: Secondary | ICD-10-CM | POA: Diagnosis not present

## 2019-07-08 DIAGNOSIS — I739 Peripheral vascular disease, unspecified: Secondary | ICD-10-CM | POA: Diagnosis not present

## 2019-07-08 DIAGNOSIS — N183 Chronic kidney disease, stage 3 unspecified: Secondary | ICD-10-CM | POA: Diagnosis not present

## 2019-07-08 DIAGNOSIS — M6281 Muscle weakness (generalized): Secondary | ICD-10-CM | POA: Diagnosis not present

## 2019-07-08 DIAGNOSIS — I69318 Other symptoms and signs involving cognitive functions following cerebral infarction: Secondary | ICD-10-CM | POA: Diagnosis not present

## 2019-07-08 DIAGNOSIS — E1122 Type 2 diabetes mellitus with diabetic chronic kidney disease: Secondary | ICD-10-CM | POA: Diagnosis not present

## 2019-07-08 DIAGNOSIS — I129 Hypertensive chronic kidney disease with stage 1 through stage 4 chronic kidney disease, or unspecified chronic kidney disease: Secondary | ICD-10-CM | POA: Diagnosis not present

## 2019-07-09 ENCOUNTER — Telehealth: Payer: Self-pay | Admitting: Internal Medicine

## 2019-07-09 NOTE — Telephone Encounter (Signed)
I attempted to contact patient 07/09/19 to schedule follow up visit from patients recall list with Dr. Rennis Golden. The patient stated I needed to call his daughter from the emergency contact list and she would be able to schedule that appointment for him.

## 2019-07-12 DIAGNOSIS — M6281 Muscle weakness (generalized): Secondary | ICD-10-CM | POA: Diagnosis not present

## 2019-07-12 DIAGNOSIS — I69318 Other symptoms and signs involving cognitive functions following cerebral infarction: Secondary | ICD-10-CM | POA: Diagnosis not present

## 2019-07-12 DIAGNOSIS — I739 Peripheral vascular disease, unspecified: Secondary | ICD-10-CM | POA: Diagnosis not present

## 2019-07-12 DIAGNOSIS — E1122 Type 2 diabetes mellitus with diabetic chronic kidney disease: Secondary | ICD-10-CM | POA: Diagnosis not present

## 2019-07-12 DIAGNOSIS — E1140 Type 2 diabetes mellitus with diabetic neuropathy, unspecified: Secondary | ICD-10-CM | POA: Diagnosis not present

## 2019-07-12 DIAGNOSIS — I129 Hypertensive chronic kidney disease with stage 1 through stage 4 chronic kidney disease, or unspecified chronic kidney disease: Secondary | ICD-10-CM | POA: Diagnosis not present

## 2019-07-12 DIAGNOSIS — N183 Chronic kidney disease, stage 3 unspecified: Secondary | ICD-10-CM | POA: Diagnosis not present

## 2019-07-14 DIAGNOSIS — E1122 Type 2 diabetes mellitus with diabetic chronic kidney disease: Secondary | ICD-10-CM | POA: Diagnosis not present

## 2019-07-14 DIAGNOSIS — E1140 Type 2 diabetes mellitus with diabetic neuropathy, unspecified: Secondary | ICD-10-CM | POA: Diagnosis not present

## 2019-07-14 DIAGNOSIS — N183 Chronic kidney disease, stage 3 unspecified: Secondary | ICD-10-CM | POA: Diagnosis not present

## 2019-07-14 DIAGNOSIS — I69318 Other symptoms and signs involving cognitive functions following cerebral infarction: Secondary | ICD-10-CM | POA: Diagnosis not present

## 2019-07-14 DIAGNOSIS — I129 Hypertensive chronic kidney disease with stage 1 through stage 4 chronic kidney disease, or unspecified chronic kidney disease: Secondary | ICD-10-CM | POA: Diagnosis not present

## 2019-07-14 DIAGNOSIS — I739 Peripheral vascular disease, unspecified: Secondary | ICD-10-CM | POA: Diagnosis not present

## 2019-07-14 DIAGNOSIS — M6281 Muscle weakness (generalized): Secondary | ICD-10-CM | POA: Diagnosis not present

## 2019-07-15 DIAGNOSIS — E1122 Type 2 diabetes mellitus with diabetic chronic kidney disease: Secondary | ICD-10-CM | POA: Diagnosis not present

## 2019-07-15 DIAGNOSIS — N183 Chronic kidney disease, stage 3 unspecified: Secondary | ICD-10-CM | POA: Diagnosis not present

## 2019-07-15 DIAGNOSIS — I129 Hypertensive chronic kidney disease with stage 1 through stage 4 chronic kidney disease, or unspecified chronic kidney disease: Secondary | ICD-10-CM | POA: Diagnosis not present

## 2019-07-15 DIAGNOSIS — M6281 Muscle weakness (generalized): Secondary | ICD-10-CM | POA: Diagnosis not present

## 2019-07-15 DIAGNOSIS — E1140 Type 2 diabetes mellitus with diabetic neuropathy, unspecified: Secondary | ICD-10-CM | POA: Diagnosis not present

## 2019-07-15 DIAGNOSIS — I69318 Other symptoms and signs involving cognitive functions following cerebral infarction: Secondary | ICD-10-CM | POA: Diagnosis not present

## 2019-07-15 DIAGNOSIS — I739 Peripheral vascular disease, unspecified: Secondary | ICD-10-CM | POA: Diagnosis not present

## 2019-07-16 ENCOUNTER — Other Ambulatory Visit: Payer: Self-pay

## 2019-07-16 ENCOUNTER — Other Ambulatory Visit: Payer: Medicare Other

## 2019-07-16 DIAGNOSIS — E1122 Type 2 diabetes mellitus with diabetic chronic kidney disease: Secondary | ICD-10-CM | POA: Diagnosis not present

## 2019-07-16 DIAGNOSIS — E1159 Type 2 diabetes mellitus with other circulatory complications: Secondary | ICD-10-CM

## 2019-07-16 DIAGNOSIS — N183 Chronic kidney disease, stage 3 unspecified: Secondary | ICD-10-CM | POA: Diagnosis not present

## 2019-07-16 DIAGNOSIS — I10 Essential (primary) hypertension: Secondary | ICD-10-CM | POA: Diagnosis not present

## 2019-07-17 LAB — BASIC METABOLIC PANEL
BUN/Creatinine Ratio: 10 (ref 10–24)
BUN: 17 mg/dL (ref 8–27)
CO2: 20 mmol/L (ref 20–29)
Calcium: 8.5 mg/dL — ABNORMAL LOW (ref 8.6–10.2)
Chloride: 103 mmol/L (ref 96–106)
Creatinine, Ser: 1.62 mg/dL — ABNORMAL HIGH (ref 0.76–1.27)
GFR calc Af Amer: 48 mL/min/{1.73_m2} — ABNORMAL LOW (ref >59)
GFR calc non Af Amer: 42 mL/min/{1.73_m2} — ABNORMAL LOW (ref >59)
Glucose: 94 mg/dL (ref 65–99)
Potassium: 4 mmol/L (ref 3.5–5.2)
Sodium: 137 mmol/L (ref 134–144)

## 2019-07-19 DIAGNOSIS — M6281 Muscle weakness (generalized): Secondary | ICD-10-CM | POA: Diagnosis not present

## 2019-07-19 DIAGNOSIS — E1140 Type 2 diabetes mellitus with diabetic neuropathy, unspecified: Secondary | ICD-10-CM | POA: Diagnosis not present

## 2019-07-19 DIAGNOSIS — E1122 Type 2 diabetes mellitus with diabetic chronic kidney disease: Secondary | ICD-10-CM | POA: Diagnosis not present

## 2019-07-19 DIAGNOSIS — I739 Peripheral vascular disease, unspecified: Secondary | ICD-10-CM | POA: Diagnosis not present

## 2019-07-19 DIAGNOSIS — I129 Hypertensive chronic kidney disease with stage 1 through stage 4 chronic kidney disease, or unspecified chronic kidney disease: Secondary | ICD-10-CM | POA: Diagnosis not present

## 2019-07-19 DIAGNOSIS — N183 Chronic kidney disease, stage 3 unspecified: Secondary | ICD-10-CM | POA: Diagnosis not present

## 2019-07-19 DIAGNOSIS — I69318 Other symptoms and signs involving cognitive functions following cerebral infarction: Secondary | ICD-10-CM | POA: Diagnosis not present

## 2019-07-21 DIAGNOSIS — I129 Hypertensive chronic kidney disease with stage 1 through stage 4 chronic kidney disease, or unspecified chronic kidney disease: Secondary | ICD-10-CM | POA: Diagnosis not present

## 2019-07-21 DIAGNOSIS — N183 Chronic kidney disease, stage 3 unspecified: Secondary | ICD-10-CM | POA: Diagnosis not present

## 2019-07-21 DIAGNOSIS — E1140 Type 2 diabetes mellitus with diabetic neuropathy, unspecified: Secondary | ICD-10-CM | POA: Diagnosis not present

## 2019-07-21 DIAGNOSIS — I69318 Other symptoms and signs involving cognitive functions following cerebral infarction: Secondary | ICD-10-CM | POA: Diagnosis not present

## 2019-07-21 DIAGNOSIS — I739 Peripheral vascular disease, unspecified: Secondary | ICD-10-CM | POA: Diagnosis not present

## 2019-07-21 DIAGNOSIS — M6281 Muscle weakness (generalized): Secondary | ICD-10-CM | POA: Diagnosis not present

## 2019-07-21 DIAGNOSIS — E1122 Type 2 diabetes mellitus with diabetic chronic kidney disease: Secondary | ICD-10-CM | POA: Diagnosis not present

## 2019-07-21 LAB — BASIC METABOLIC PANEL
BUN: 11 (ref 4–21)
CO2: 27 — AB (ref 13–22)
Chloride: 100 (ref 99–108)
Creatinine: 0.8 (ref 0.6–1.3)
Glucose: 96
Potassium: 4.4 (ref 3.4–5.3)
Sodium: 137 (ref 137–147)

## 2019-07-21 LAB — CBC AND DIFFERENTIAL
HCT: 38 — AB (ref 41–53)
Hemoglobin: 13.3 — AB (ref 13.5–17.5)
Platelets: 240 (ref 150–399)
WBC: 6.3

## 2019-07-21 LAB — HEPATIC FUNCTION PANEL
ALT: 14 (ref 10–40)
AST: 18 (ref 14–40)
Alkaline Phosphatase: 69 (ref 25–125)
Bilirubin, Direct: 0.08 (ref 0.01–0.4)
Bilirubin, Total: 0.2

## 2019-07-21 LAB — COMPREHENSIVE METABOLIC PANEL
Albumin: 4.4 (ref 3.5–5.0)
Calcium: 9.5 (ref 8.7–10.7)
GFR calc Af Amer: 104
GFR calc non Af Amer: 90

## 2019-07-21 LAB — CBC: RBC: 4.32 (ref 3.87–5.11)

## 2019-07-21 LAB — TSH: TSH: 2.32 (ref 0.41–5.90)

## 2019-07-26 ENCOUNTER — Encounter: Payer: Self-pay | Admitting: Podiatry

## 2019-07-26 ENCOUNTER — Other Ambulatory Visit: Payer: Self-pay | Admitting: *Deleted

## 2019-07-26 ENCOUNTER — Ambulatory Visit (INDEPENDENT_AMBULATORY_CARE_PROVIDER_SITE_OTHER): Payer: Medicare Other | Admitting: Podiatry

## 2019-07-26 ENCOUNTER — Other Ambulatory Visit: Payer: Self-pay

## 2019-07-26 DIAGNOSIS — E782 Mixed hyperlipidemia: Secondary | ICD-10-CM

## 2019-07-26 DIAGNOSIS — E119 Type 2 diabetes mellitus without complications: Secondary | ICD-10-CM | POA: Diagnosis not present

## 2019-07-26 DIAGNOSIS — M79674 Pain in right toe(s): Secondary | ICD-10-CM

## 2019-07-26 DIAGNOSIS — B351 Tinea unguium: Secondary | ICD-10-CM

## 2019-07-26 DIAGNOSIS — M79675 Pain in left toe(s): Secondary | ICD-10-CM | POA: Diagnosis not present

## 2019-07-26 MED ORDER — ATORVASTATIN CALCIUM 40 MG PO TABS: 40.0000 mg | ORAL_TABLET | Freq: Every day | ORAL | 2 refills | Status: DC

## 2019-07-30 ENCOUNTER — Encounter: Payer: Self-pay | Admitting: Physician Assistant

## 2019-07-30 ENCOUNTER — Other Ambulatory Visit: Payer: Self-pay

## 2019-07-30 ENCOUNTER — Ambulatory Visit (INDEPENDENT_AMBULATORY_CARE_PROVIDER_SITE_OTHER): Payer: Medicare Other | Admitting: Physician Assistant

## 2019-07-30 VITALS — BP 135/74 | HR 70 | Temp 96.8°F | Ht 76.0 in | Wt 215.0 lb

## 2019-07-30 DIAGNOSIS — E785 Hyperlipidemia, unspecified: Secondary | ICD-10-CM | POA: Diagnosis not present

## 2019-07-30 DIAGNOSIS — R569 Unspecified convulsions: Secondary | ICD-10-CM | POA: Diagnosis not present

## 2019-07-30 DIAGNOSIS — I714 Abdominal aortic aneurysm, without rupture, unspecified: Secondary | ICD-10-CM

## 2019-07-30 DIAGNOSIS — I10 Essential (primary) hypertension: Secondary | ICD-10-CM | POA: Diagnosis not present

## 2019-07-30 DIAGNOSIS — I480 Paroxysmal atrial fibrillation: Secondary | ICD-10-CM | POA: Diagnosis not present

## 2019-07-30 NOTE — Progress Notes (Signed)
Cardiology Office Note:    Date:  08/01/2019   ID:  Brian Burns, DOB 05-06-1945, MRN [id number]  PCP:  Joana Reamer, DO  [facility] Cardiologist:  Chrystie Nose, MD  [facility] Electrophysiologist:  None   Referring MD: Joana Reamer, DO   Chief Complaint  Patient presents with  . Follow-up    seen for Dr. Rennis Golden    History of Present Illness:    Brian Burns is a 74 y.o. male with a hx of atrial fibrillation, AAA, carotid artery disease, HTN, HLD and CKD III. Patient had catheter related UTI in 2016 associated with onset of atrial fibrillation.  Echocardiogram obtained at the time showed EF 50 to 55% and he was started on Coumadin.  Myoview obtained on 09/12/2016 showed EF 46%, normal perfusion, findings consistent with nonischemic cardiomyopathy.  Follow-up echocardiogram performed on 10/01/2016 showed EF 50 to 55%, hypokinesis of the inferolateral wall, grade 1 DD, trivial AI, mildly dilated aortic root.  Patient was last seen in the office by Dr. Rennis Golden in May 2019, heart rate was low at 46, however patient was asymptomatic, therefore it was decided to continue observation.  More recently, patient was admitted to the hospital in April 2021 with altered mental status secondary to sepsis.  Blood culture was positive for gram-positive rod consistent with bacteremia.  Prior to the admission, patient had episode of seizure in the presence of EMS.  This self resolved and there is no further occurrence during the hospitalization.  Family decided not to pursue any further neurological work-up.  Due to AKI, lisinopril was discontinued and amlodipine was uptitrated to 10 mg daily.  Patient presents today for cardiology office visit.  Amlodipine has been discontinued and he was restarted on lisinopril 5 mg to help protect his renal function.  Lab work showed stable renal function.  Most recent lab work obtained on 6/23 is likely faulty and I am not sure if it is his lab or someone  else's as it did not make any sense that his renal function returned to the normal range. Otherwise, he denies any recent chest pain or shortness of breath.     Past Medical History:  Diagnosis Date  . AAA (abdominal aortic aneurysm) without rupture (HCC) 06/2014   no change in last exam from 2015  . Achalasia   . Arthritis    knees,lower back  . Atrial fibrillation (HCC)   . Atrial flutter with rapid ventricular response (HCC) 01/16/2015  . Bilateral wrist pain 09/09/2014  . Bladder outlet obstruction 01/20/2015  . Bradycardia 08/27/2016  . CAD (coronary artery disease)    pt denies  . Carotid artery disease (HCC)   . Chronic back pain   . Chronic kidney disease (CKD), stage III (moderate)   . Decreased visual acuity of left eye  12/02/2018  . Dyspnea    reports today no pregoression  . Erectile dysfunction   . GERD (gastroesophageal reflux disease)   . Headache    hx of  . History of CVA (cerebrovascular accident)    2009  &  2013  . History of Multiple lacunar infarcts (HCC) 01/31/2015   MRI Brain 01/31/2015: Widespread old lacunar infarcts, particularly right Basal ganglia, pons, and right cerebellum  . HLD (hyperlipidemia)   . Hypertension   . Idiopathic pancreatitis 12/2014   acute  . Knee pain, bilateral 04/09/2011  . Malnutrition of moderate degree 01/25/2015  . Memory impairment    mild dementia per PCP  progress note   . Morbid obesity (HCC) 12/03/2018  . Other abnormalities of gait and mobility 12/03/2018  . Pre-diabetes    per pt. no meds  . PVD (peripheral vascular disease) with claudication (HCC)   . Urethral stricture 02/16/2013  . Urge incontinence 12/08/2018    Past Surgical History:  Procedure Laterality Date  . back injections     [facility]  . BALLOON DILATION N/A 12/16/2018   Procedure: URETHRAL BALLOON DILATION;  Surgeon: Crist Fat, MD;  Location: [facility];  Service: Urology;  Laterality: N/A;  . CARDIOVASCULAR STRESS TEST   08-09-2009   mild global hypokinesis,  no ischemia or scar/  ef 41%  . CATARACT EXTRACTION W/ INTRAOCULAR LENS  IMPLANT, BILATERAL  april and may 2014  . CYSTOSCOPY N/A 01/17/2017   Procedure: CYSTOSCOPY FLEXIBLE;  Surgeon: Crist Fat, MD;  Location: [facility];  Service: Urology;  Laterality: N/A;  . CYSTOSCOPY WITH RETROGRADE URETHROGRAM N/A 11/24/2014   Procedure: CYSTOSCOPY WITH RETROGRADE URETHROGRAM;  Surgeon: Crist Fat, MD;  Location: [facility];  Service: Urology;  Laterality: N/A;  . CYSTOSCOPY WITH RETROGRADE URETHROGRAM N/A 12/16/2018   Procedure: CYSTOSCOPY WITH RETROGRADE URETHROGRAM;  Surgeon: Crist Fat, MD;  Location: [facility];  Service: Urology;  Laterality: N/A;  . CYSTOSCOPY WITH URETHRAL DILATATION N/A 11/24/2014   Procedure: CYSTOSCOPY WITH URETHRAL DILATATION;  Surgeon: Crist Fat, MD;  Location[facility];  Service: Urology;  Laterality: N/A;  BALLOON DILATION        . ESOPHAGOGASTRODUODENOSCOPY N/A 05/15/2014   Procedure: ESOPHAGOGASTRODUODENOSCOPY (EGD);  Surgeon: Carman Ching, MD;  Location: [facility];  Service: Endoscopy;  Laterality: N/A;  . ESOPHAGOGASTRODUODENOSCOPY Left 02/02/2015   Procedure: ESOPHAGOGASTRODUODENOSCOPY (EGD);  Surgeon: Dorena Cookey, MD;  Location: [facility]COPY;  Service: Endoscopy;  Laterality: Left;  . ESOPHAGOGASTRODUODENOSCOPY Left 03/16/2015   Procedure: ESOPHAGOGASTRODUODENOSCOPY (EGD);  Surgeon: Dorena Cookey, MD;  Location: [facility];  Service: Endoscopy;  Laterality: Left;  . TRANSTHORACIC ECHOCARDIOGRAM  01-07-2012   mild to moderate dilated LV,  ef 45-50%,  mild basal hypokinesis,  grade I diastolic  dysfunction/  trivial AR/  mild Brian  . URETHROPLASTY N/A 01/17/2017   Procedure: URETHEROPLASTY BUCCAL GRAFT AND BUCCAL MUCOSA GRAFT HARVEST;  Surgeon: Crist Fat, MD;  Location: [facility];  Service: Urology;  Laterality: N/A;    Current Medications: Current Meds    Medication Sig  . acetaminophen (TYLENOL) 325 MG tablet Take 325 mg by mouth every 6 (six) hours as needed for moderate pain.  Marland Kitchen atorvastatin (LIPITOR) 40 MG tablet Take 1 tablet (40 mg total) by mouth daily.  . citalopram (CELEXA) 40 MG tablet Take 1 tablet (40 mg total) by mouth at bedtime.  . ergocalciferol (VITAMIN D2) 1.25 MG (50000 UT) capsule Take 1 capsule (50,000 Units total) by mouth once a week.  . ferrous sulfate 324 MG TBEC Take 1 tablet (324 mg total) by mouth daily with breakfast.  . finasteride (PROSCAR) 5 MG tablet Take 1 tablet (5 mg total) by mouth at bedtime.  . lipase/protease/amylase (CREON) 12000-38000 units CPEP capsule take 1 capsule by mouth three times a day with meals  . lisinopril (ZESTRIL) 5 MG tablet Take 1 tablet (5 mg total) by mouth at bedtime.  Marland Kitchen omeprazole (PRILOSEC) 20 MG capsule Take 1 capsule (20 mg total) by mouth daily.  . Rivaroxaban (XARELTO) 15 MG TABS tablet Take 1 tablet (15 mg total) by mouth at bedtime. With a meal.  .  tamsulosin (FLOMAX) 0.4 MG CAPS capsule TAKE 1 CAPSULE(0.4 MG) BY MOUTH DAILY     Allergies:   Gabapentin   Social History   Socioeconomic History  . Marital status: Married    Spouse name: Brian Burns  . Number of children: 2   . Years of education: 7  . Highest education level: 7th grade  Occupational History  . 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 Occupation: B[employer]    Comment: on the weekends  Tobacco Use  . Smoking status: Former Smoker    Packs/day: 0.50    Years: 40.00    Pack years: 20.00    Types: Cigarettes    Quit date: 01/29/1999    Years since quitting: 20.5  . Smokeless tobacco: Never Used  Vaping Use  . Vaping Use: Never used  Substance and Sexual Activity  . Alcohol use: No    Alcohol/week: 0.0 standard drinks    Comment: hx of quit in 2006  . Drug use: Never  . Sexual activity: Not Currently  Other Topics Concern  . Not on file  Social History  Narrative   Brian Burns lives with his wife, Brian Burns, in a one story, single family home dwelling with 4 steps into the home.    The Cocuzza's receive Food & Nutrition Benefits and Social Security Income   Brian Burns has given permission to share his private health information with his daughter, Brian Burns ([phone]) 12/03/18   Brian Burns is the agent for [employer] Power of [name]- The document was signed by patient and Notarized in [facility] office on 12/03/18.    Ms Samule Ohm manages her father's finances   Social Determinants of Health   Financial Resource Strain:   . Difficulty of Paying Living Expenses:   Food Insecurity: Food Insecurity Present  . Worried About Programme researcher, broadcasting/film/video in the Last Year: Sometimes true  . Ran Out of Food in the Last Year: Sometimes true  Transportation Needs:   . Lack of Transportation (Medical):   Marland Kitchen Lack of Transportation (Non-Medical):   Physical Activity:   . Days of Exercise per Week:   . Minutes of Exercise per Session:   Stress:   . Feeling of Stress :   Social Connections:   . Frequency of Communication with Friends and Family:   . Frequency of Social Gatherings with Friends and Family:   . Attends Religious Services:   . Active Member of Clubs or Organizations:   . Attends Banker Meetings:   Marland Kitchen Marital Status:      Family History: The patient's family history includes Heart disease in his brother and mother; Kidney disease in his brother and mother; Lung disease in his brother and mother.  ROS:   Please see the history of present illness.     All other systems reviewed and are negative.  EKGs/Labs/Other Studies Reviewed:    The following studies were reviewed today:  Myoview 09/12/2016  The left ventricular ejection fraction is mildly decreased (45-54%).  Nuclear stress EF: 46%.  There was no ST segment deviation noted during stress.  This is a low risk study.   Low risk stress nuclear  study with normal perfusion and mildly reduced left ventricular global systolic function. Findings are most compatible with nonischemic cardiomyopathy   Echo 10/01/2016 Study Conclusions   - Left ventricle: The cavity size was mildly dilated. Wall  thickness was increased in a  pattern of mild LVH. Systolic  function was normal. The estimated ejection fraction was in the  range of 50% to 55%. There is hypokinesis of the inferolateral  myocardium. Doppler parameters are consistent with abnormal left  ventricular relaxation (grade 1 diastolic dysfunction). Doppler  parameters are consistent with high ventricular filling pressure.  - Aortic valve: There was trivial regurgitation.  - Aortic root: The aortic root was mildly dilated.  - Ascending aorta: The ascending aorta was mildly dilated.  - Mitral valve: Calcified annulus.  - Right ventricle: The cavity size was mildly dilated.   Impressions:   - Hypokinesis of the inferolateral wall with overall low normal LV systolic function; mild diastolic dysfunction; sclerotic aortic valve with trace AI; mildly dilated aortic root; mild RVE; mild TR.    EKG:  EKG is not ordered today.    Recent Labs: 07/21/2019: ALT 14; BUN 11; Creatinine 0.8; Hemoglobin 13.3; Platelets 240; Potassium 4.4; Sodium 137; TSH 2.32  Recent Lipid Panel    Component Value Date/Time   CHOL 131 05/25/2019 1133   TRIG 80 05/25/2019 1133   HDL 36 (L) 05/25/2019 1133   CHOLHDL 3.6 05/25/2019 1133   VLDL 16 05/25/2019 1133   LDLCALC 79 05/25/2019 1133   LDLDIRECT 175 (H) 04/09/2011 1122    Physical Exam:    VS:  BP 135/74   Pulse 70   Temp (!) 96.8 F (36 C)   Ht 6\' 4"  (1.93 m)   Wt 215 lb (97.5 kg)   SpO2 95%   BMI 26.17 kg/m     Wt Readings from Last 3 Encounters:  07/30/19 215 lb (97.5 kg)  06/18/19 215 lb 6.4 oz (97.7 kg)  06/15/19 213 lb 6.4 oz (96.8 kg)     GEN:  Well nourished, well developed in no acute distress HEENT: Normal NECK:  No JVD; No carotid bruits LYMPHATICS: No lymphadenopathy CARDIAC: RRR, no murmurs, rubs, gallops RESPIRATORY:  Clear to auscultation without rales, wheezing or rhonchi  ABDOMEN: Soft, non-tender, non-distended MUSCULOSKELETAL:  No edema; No deformity  SKIN: Warm and dry NEUROLOGIC:  Alert and oriented x 3 PSYCHIATRIC:  Normal affect   ASSESSMENT:    1. Paroxysmal atrial fibrillation (HCC)   2. AAA (abdominal aortic aneurysm) without rupture (HCC)   3. Essential hypertension   4. Hyperlipidemia LDL goal <70   5. Seizure (HCC)    PLAN:    In order of problems listed above:  1. Paroxysmal atrial fibrillation: Continue Xarelto  2. AAA: Followed by vascular surgery  3. Hypertension: Amlodipine has been stopped and that he is back on lisinopril for renal protection  4. Hyperlipidemia: On Lipitor  5. Recent seizure: Solitary episode, no recurrence.  Family decided not to pursue advanced neurological work-up.   Medication Adjustments/Labs and Tests Ordered: Current medicines are reviewed at length with the patient today.  Concerns regarding medicines are outlined above.  No orders of the defined types were placed in this encounter.  No orders of the defined types were placed in this encounter.   Patient Instructions  Medication Instructions:  Your physician recommends that you continue on your current medications as directed. Please refer to the Current Medication list given to you today.  *If you need a refill on your cardiac medications before your next appointment, please call your pharmacy*  Lab Work: NONE ordered at this time of appointment   If you have labs (blood work) drawn today and your tests are completely normal, you will receive your results only  by: . MyChart Message (if you have MyChart) OR . A paper copy in the mail If you have any lab test that is abnormal or we need to change your treatment, we will call you to review the  results.  Testing/Procedures: NONE ordered at this time of appointment   Follow-Up: At [facility], you and your health needs are our priority.  As part of our continuing mission to provide you with exceptional heart care, we have created designated Provider Care Teams.  These Care Teams include your primary Cardiologist (physician) and Advanced Practice Providers (APPs -  Physician Assistants and Nurse Practitioners) who all work together to provide you with the care you need, when you need it.  We recommend signing up for the patient portal called "MyChart".  Sign up information is provided on this After Visit Summary.  MyChart is used to connect with patients for Virtual Visits (Telemedicine).  Patients are able to view lab/test results, encounter notes, upcoming appointments, etc.  Non-urgent messages can be sent to your provider as well.   To learn more about what you can do with MyChart, go to [email or web address].    Your next appointment:   1 year(s)  The format for your next appointment:   In Person  Provider:   Kirtland Bouchard [place] Hilty, MD  Other Instructions      Signed, Azalee Course, PA  08/01/2019 10:32 PM    [facility]

## 2019-07-30 NOTE — Patient Instructions (Signed)
Medication Instructions:  Your physician recommends that you continue on your current medications as directed. Please refer to the Current Medication list given to you today.  *If you need a refill on your cardiac medications before your next appointment, please call your pharmacy*  Lab Work: NONE ordered at this time of appointment   If you have labs (blood work) drawn today and your tests are completely normal, you will receive your results only by: MyChart Message (if you have MyChart) OR A paper copy in the mail If you have any lab test that is abnormal or we need to change your treatment, we will call you to review the results.  Testing/Procedures: NONE ordered at this time of appointment   Follow-Up: At [facility], you and your health needs are our priority.  As part of our continuing mission to provide you with exceptional heart care, we have created designated Provider Care Teams.  These Care Teams include your primary Cardiologist (physician) and Advanced Practice Providers (APPs -  Physician Assistants and Nurse Practitioners) who all work together to provide you with the care you need, when you need it.  We recommend signing up for the patient portal called "MyChart".  Sign up information is provided on this After Visit Summary.  MyChart is used to connect with patients for Virtual Visits (Telemedicine).  Patients are able to view lab/test results, encounter notes, upcoming appointments, etc.  Non-urgent messages can be sent to your provider as well.   To learn more about what you can do with MyChart, go to [email or web address].    Your next appointment:   1 year(s)  The format for your next appointment:   In Person  Provider:   K. Chad Hilty, MD  Other Instructions   

## 2019-07-31 NOTE — Progress Notes (Signed)
Subjective:  Patient ID: Brian Burns, male    DOB: 02/26/1945,  MRN: [id number]  74 y.o. male presents with painful thick toenails that are difficult to trim. Pain interferes with ambulation. Aggravating factors include wearing enclosed shoe gear. Pain is relieved with periodic professional debridement..    Review of Systems: Negative except as noted in the HPI. Denies N/V/F/Ch.  Past Medical History:  Diagnosis Date  . AAA (abdominal aortic aneurysm) without rupture (HCC) 06/2014   no change in last exam from 2015  . Achalasia   . Arthritis    knees,lower back  . Atrial fibrillation (HCC)   . Atrial flutter with rapid ventricular response (HCC) 01/16/2015  . Bilateral wrist pain 09/09/2014  . Bladder outlet obstruction 01/20/2015  . Bradycardia 08/27/2016  . CAD (coronary artery disease)    pt denies  . Carotid artery disease (HCC)   . Chronic back pain   . Chronic kidney disease (CKD), stage III (moderate)   . Decreased visual acuity of left eye  12/02/2018  . Dyspnea    reports today no pregoression  . Erectile dysfunction   . GERD (gastroesophageal reflux disease)   . Headache    hx of  . History of CVA (cerebrovascular accident)    2009  &  2013  . History of Multiple lacunar infarcts (HCC) 01/31/2015   MRI Brain 01/31/2015: Widespread old lacunar infarcts, particularly right Basal ganglia, pons, and right cerebellum  . HLD (hyperlipidemia)   . Hypertension   . Idiopathic pancreatitis 12/2014   acute  . Knee pain, bilateral 04/09/2011  . Malnutrition of moderate degree 01/25/2015  . Memory impairment    mild dementia per PCP progress note   . Morbid obesity (HCC) 12/03/2018  . Other abnormalities of gait and mobility 12/03/2018  . Pre-diabetes    per pt. no meds  . PVD (peripheral vascular disease) with claudication (HCC)   . Urethral stricture 02/16/2013  . Urge incontinence 12/08/2018   Past Surgical History:  Procedure Laterality Date  . back injections      [facility]  . BALLOON DILATION N/A 12/16/2018   Procedure: URETHRAL BALLOON DILATION;  Surgeon: Crist Fat, MD;  Location: [facility];  Service: Urology;  Laterality: N/A;  . CARDIOVASCULAR STRESS TEST  08-09-2009   mild global hypokinesis,  no ischemia or scar/  ef 41%  . CATARACT EXTRACTION W/ INTRAOCULAR LENS  IMPLANT, BILATERAL  april and may 2014  . CYSTOSCOPY N/A 01/17/2017   Procedure: CYSTOSCOPY FLEXIBLE;  Surgeon: Crist Fat, MD;  Location: [facility];  Service: Urology;  Laterality: N/A;  . CYSTOSCOPY WITH RETROGRADE URETHROGRAM N/A 11/24/2014   Procedure: CYSTOSCOPY WITH RETROGRADE URETHROGRAM;  Surgeon: Crist Fat, MD;  Location: [facility];  Service: Urology;  Laterality: N/A;  . CYSTOSCOPY WITH RETROGRADE URETHROGRAM N/A 12/16/2018   Procedure: CYSTOSCOPY WITH RETROGRADE URETHROGRAM;  Surgeon: Crist Fat, MD;  Location: [facility];  Service: Urology;  Laterality: N/A;  . CYSTOSCOPY WITH URETHRAL DILATATION N/A 11/24/2014   Procedure: CYSTOSCOPY WITH URETHRAL DILATATION;  Surgeon: Crist Fat, MD;  Location: [facility];  Service: Urology;  Laterality: N/A;  BALLOON DILATION        . ESOPHAGOGASTRODUODENOSCOPY N/A 05/15/2014   Procedure: ESOPHAGOGASTRODUODENOSCOPY (EGD);  Surgeon: Carman Ching, MD;  Location: [facility];  Service: Endoscopy;  Laterality: N/A;  . ESOPHAGOGASTRODUODENOSCOPY Left 02/02/2015   Procedure: ESOPHAGOGASTRODUODENOSCOPY (EGD);  Surgeon: Dorena Cookey, MD;  Location: [facility];  Service: Endoscopy;  Laterality: Left;  . ESOPHAGOGASTRODUODENOSCOPY Left 03/16/2015   Procedure: ESOPHAGOGASTRODUODENOSCOPY (EGD);  Surgeon: Dorena Cookey, MD;  Location: [facility];  Service: Endoscopy;  Laterality: Left;  . TRANSTHORACIC ECHOCARDIOGRAM  01-07-2012   mild to moderate dilated LV,  ef 45-50%,  mild basal hypokinesis,  grade I diastolic  dysfunction/  trivial AR/  mild MR  . URETHROPLASTY N/A  01/17/2017   Procedure: URETHEROPLASTY BUCCAL GRAFT AND BUCCAL MUCOSA GRAFT HARVEST;  Surgeon: Crist Fat, MD;  Location: [facility];  Service: Urology;  Laterality: N/A;   Patient Active Problem List   Diagnosis Date Noted  . Encephalopathy   . Seizure (HCC)   . Sepsis (HCC) 05/25/2019  . Acute cystitis with hematuria   . Urge incontinence 12/08/2018  . Stenosis of lateral recess of lumbar spine 12/08/2018  . Dementia, vascular (HCC) 12/08/2018  . Morbid obesity (HCC) 12/03/2018  . Decreased visual acuity of left eye  12/02/2018  . Gross hematuria 10/29/2018  . Other abnormalities of gait and mobility 09/02/2018  . Muscle weakness (generalized) 04/08/2018  . Lumbar spinal stenosis 04/05/2018  . Chronic pain of right knee 03/07/2017  . Chronic low back pain without sciatica 03/07/2017  . Nonischemic cardiomyopathy (HCC) 09/25/2016  . Microalbuminuria 07/26/2015  . Paroxysmal atrial fibrillation (HCC)   . Coronary artery disease involving native coronary artery of native heart without angina pectoris   . Stricture, urethra   . CKD stage 3 due to type 2 diabetes mellitus (HCC) 03/16/2015  . Depression 03/16/2015  . Normocytic anemia   . History of Multiple lacunar infarcts (HCC) 01/31/2015  . Dehydration   . AKI (acute kidney injury) (HCC)   . GERD (gastroesophageal reflux disease) 10/21/2013  . AAA (abdominal aortic aneurysm) without rupture (HCC) 06/24/2013  . PVD (peripheral vascular disease) (HCC) 06/24/2013  . Orthostatic dizziness 03/18/2013  . Hearing loss of both ears 03/18/2013  . BPH (benign prostatic hyperplasia) 02/16/2013  . Type 2 diabetes mellitus with diabetic neuropathy, unspecified (HCC) 05/10/2012  . Type 2 diabetes mellitus with hypercholesterolemia (HCC) 02/05/2007  . Hypertension associated with diabetes (HCC) 03/27/2006    Current Outpatient Medications:  .  acetaminophen (TYLENOL) 325 MG tablet, Take 325 mg by mouth every 6 (six) hours as needed  for moderate pain., Disp: , Rfl:  .  atorvastatin (LIPITOR) 40 MG tablet, Take 1 tablet (40 mg total) by mouth daily., Disp: 90 tablet, Rfl: 2 .  citalopram (CELEXA) 40 MG tablet, Take 1 tablet (40 mg total) by mouth at bedtime., Disp: 30 tablet, Rfl: 0 .  ergocalciferol (VITAMIN D2) 1.25 MG (50000 UT) capsule, Take 1 capsule (50,000 Units total) by mouth once a week., Disp: 4 capsule, Rfl: 0 .  ferrous sulfate 324 MG TBEC, Take 1 tablet (324 mg total) by mouth daily with breakfast., Disp: 30 tablet, Rfl: 0 .  finasteride (PROSCAR) 5 MG tablet, Take 1 tablet (5 mg total) by mouth at bedtime., Disp: 30 tablet, Rfl: 0 .  lipase/protease/amylase (CREON) 12000-38000 units CPEP capsule, take 1 capsule by mouth three times a day with meals, Disp: 90 capsule, Rfl: 0 .  lisinopril (ZESTRIL) 5 MG tablet, Take 1 tablet (5 mg total) by mouth at bedtime., Disp: 90 tablet, Rfl: 3 .  omeprazole (PRILOSEC) 20 MG capsule, Take 1 capsule (20 mg total) by mouth daily., Disp: 30 capsule, Rfl: 0 .  Rivaroxaban (XARELTO) 15 MG TABS tablet, Take 1 tablet (15 mg total) by mouth at bedtime. With a meal., Disp: 30 tablet, Rfl: 0 .  tamsulosin (FLOMAX) 0.4 MG CAPS capsule, TAKE 1 CAPSULE(0.4 MG) BY MOUTH DAILY, Disp: 30 capsule, Rfl: 0   Allergies  Allergen Reactions  . Gabapentin Other (See Comments)    Sedation at 300mg  TID   Social History   Tobacco Use  Smoking Status Former Smoker  . Packs/day: 0.50  . Years: 40.00  . Pack years: 20.00  . Types: Cigarettes  . Quit date: 01/29/1999  . Years since quitting: 20.5  Smokeless Tobacco Never Used     Objective:   Constitutional Well developed. Well nourished.  Vascular Dorsalis pedis pulses palpable bilaterally. Posterior tibial pulses palpable bilaterally. Capillary refill normal to all digits.  No cyanosis or clubbing noted. Pedal hair growth absent b/l.   Neurologic Normal speech. Oriented to person, place, and time. Epicritic sensation to light touch  grossly present bilaterally.  Dermatologic Toenails 1-5 b/l are painful, elongated, discolored, dystrophic with subungual debris. Pain with dorsal palpation of nailplates. No erythema, no edema, no drainage noted. No open wounds. No skin lesions.  Orthopedic: Normal joint ROM without pain or crepitus bilaterally. No visible deformities. No bony tenderness.   Radiographs: None Assessment:   1. Pain due to onychomycosis of toenails of both feet   2. Controlled type 2 diabetes mellitus without complication, without long-term current use of insulin (HCC)    Plan:  Patient was evaluated and treated and all questions answered.  Onychomycosis with pain -Nails palliatively debridement as below. -Educated on self-care  Procedure: Nail Debridement Rationale: Pain Type of Debridement: manual, sharp debridement. Instrumentation: Nail nipper, rotary burr. Number of Nails: 10  -Patient to report any pedal injuries to medical professional immediately. -Patient to continue soft, supportive shoe gear daily. -Patient/POA to call should there be question/concern in the interim.  Return in about 3 months (around 10/26/2019) for nail trim.  10/28/2019, DPM

## 2019-08-01 ENCOUNTER — Encounter: Payer: Self-pay | Admitting: Physician Assistant

## 2019-08-19 DIAGNOSIS — H40013 Open angle with borderline findings, low risk, bilateral: Secondary | ICD-10-CM | POA: Diagnosis not present

## 2019-08-19 DIAGNOSIS — Z961 Presence of intraocular lens: Secondary | ICD-10-CM | POA: Diagnosis not present

## 2019-08-19 DIAGNOSIS — H26492 Other secondary cataract, left eye: Secondary | ICD-10-CM | POA: Diagnosis not present

## 2019-08-19 DIAGNOSIS — R7309 Other abnormal glucose: Secondary | ICD-10-CM | POA: Diagnosis not present

## 2019-08-25 ENCOUNTER — Other Ambulatory Visit: Payer: Self-pay

## 2019-08-25 ENCOUNTER — Encounter: Payer: Self-pay | Admitting: Family Medicine

## 2019-08-25 ENCOUNTER — Ambulatory Visit (INDEPENDENT_AMBULATORY_CARE_PROVIDER_SITE_OTHER): Payer: Medicare Other | Admitting: Family Medicine

## 2019-08-25 VITALS — BP 122/60 | HR 59 | Ht 76.0 in | Wt 212.8 lb

## 2019-08-25 DIAGNOSIS — M48062 Spinal stenosis, lumbar region with neurogenic claudication: Secondary | ICD-10-CM

## 2019-08-25 DIAGNOSIS — I714 Abdominal aortic aneurysm, without rupture, unspecified: Secondary | ICD-10-CM

## 2019-08-25 NOTE — Patient Instructions (Signed)
I have placed a referral to neurosurgery for evaluation and injections. Please let me know if you dont hear from anyone within the next 2 weeks and I will look into it.

## 2019-08-25 NOTE — Progress Notes (Signed)
Subjective:   Patient ID: Brian Burns    DOB: 09/05/1945, 74 y.o. male   MRN: [id number]  Brian Burns is a 74 y.o. male with a history of AAA, coronary artery disease, hypertension, nonischemic cardiomyopathy, paroxysmal A. fib, peripheral vascular disease, GERD, CKD 3, type 2 diabetes, diabetic neuropathy, hyperlipidemia, vascular dementia, hearing loss of both ears, history of pain ultiple lacunar infarcts, BPH, right knee pain, overweight, decreased visual acuity, muscle weakness, normocytic anemia, orthostatic dizziness, abnormal gait, history of seizure, history of sepsis, urge incontinence here for evaluation of chronic low back pain.  Chronic Low Back Pain: Patient has a history of chronic low back pain in the setting of lumbar spinal stenosis.  He had MRI completed in March 2020 which was notable for spinal stenosis, osteophytes, disc protrusion, and 81mm anterolisthesis at L3-L4.  He notes improvement in symptoms when leaning forward, denies any radiculopathy.  He notes his symptoms are about the same as last year but it is interfering with his quality of life and ability to complete his IADLs.  He notes he is only had 1 gradual fall where his leg gave out as he was standing up from his bed.  He denies any significant trauma, head trauma, or loss of consciousness.  He notes this was in April 2021.  He does endorse weakness however denies any bowel or bladder incontinence or saddle anesthesia.  Review of Systems:  Per HPI.   Objective:   BP (!) 122/60   Pulse 59   Ht 6\' 4"  (1.93 m)   Wt (!) 212 lb 12.8 oz (96.5 kg)   SpO2 97%   BMI 25.90 kg/m  Vitals and nursing note reviewed.  General: Pleasant thin, tall older male, appears uncomfortable at times during exam, well nourished, well developed, in no acute distress with non-toxic appearance Resp: Breathing comfortably on room air, speaking in full sentences Skin: warm, dry Extremities: warm and well perfused MSK: see  below Neuro: Alert and oriented, speech slurred and at times difficult to understand due to prior strokes  Back Exam: Inspection: No gross deformity, scoliosis. Palpation: Nontender to palpation along entire spine length. No midline or bony TTP. No palpation along piriformis or SI joints. ROM: decreased extension, normal flexion, rotation. Pain improved with flexion, exacerbated with extension. Strength LEs 4/5 on the right, 5/5 on the left  Sensation intact to light touch bilaterally. Negative fabers and piriformis stretches. Negative straight leg raise.   MRI Lumbar Spine: 04/11/2018 IMPRESSION: L3-4: Multifactorial spinal stenosis. 2 mm of anterolisthesis because of facet osteoarthritis. Endplate osteophytes and bulging of the disc. Abundant epidural fat. Constriction of the thecal sac at this level could result in symptoms of stenosis.  L4-5: Endplate osteophytes and shallow protrusion of the disc. Facet and ligamentous hypertrophy. Stenosis of the lateral recesses that could cause neural compression on either or both sides. Asymmetric foraminal stenosis on the left because of encroachment by osteophyte and bulging disc material could affect the left L4 nerve.  Distal infrarenal abdominal aortic aneurysm, maximal transverse diameter 3.5 cm. Recommend followup by ultrasound in 2 years. This recommendation follows ACR consensus guidelines: White Paper of the ACR Incidental Findings Committee II on Vascular Findings. J Am Coll Radiol 2013; 10:789-794. Aortic aneurysm NOS (ICD10-I71.9)  Assessment & Plan:   AAA (abdominal aortic aneurysm) without rupture (HCC) 3.5 cm distal AAA on Lumbar Spinal MRI 03/2018 Monitoring recommended every 6 months. Plan to discuss repeat ultrasound at follow up visit   Lumbar spinal  stenosis Established problem. Persistent with no improvement. Unclear prognosis.  Affecting QOL and ability to completed ADL/IADLs.  Gabapentin with no improvement  and cost excess sedation. Over-the-counter Tylenol only minimal relief.  Not an NSAID candidate because of CKD. Unlikely surgical candidate given significant comorbidities, however will refer to neurosurgery for official evaluation and spinal injections.  Goal is to improve pain to allow stable ambulation and improved function to perform ADLs/IADLs. If declared as nonsurgical candidate, may benefit from [facility] referral Appears patient has been referred to home health PT in the past.  Pending above recommendations, can place another referral to allow improvement in balance and strength.  Orders Placed This Encounter  Procedures  . Ambulatory referral to Neurosurgery    Referral Priority:   Routine    Referral Type:   Surgical    Referral Reason:   Specialty Services Required    Requested Specialty:   Neurosurgery    Number of Visits Requested:   1    Orpah Cobb, DO PGY-3, [facility] 08/27/2019 9:08 AM

## 2019-08-27 NOTE — Assessment & Plan Note (Addendum)
Established problem. Persistent with no improvement. Unclear prognosis.  Affecting QOL and ability to completed ADL/IADLs.  Gabapentin with no improvement and cost excess sedation. Over-the-counter Tylenol only minimal relief.  Not an NSAID candidate because of CKD. Unlikely surgical candidate given significant comorbidities, however will refer to neurosurgery for official evaluation and spinal injections.  Goal is to improve pain to allow stable ambulation and improved function to perform ADLs/IADLs. If declared as nonsurgical candidate, may benefit from [facility] referral Appears patient has been referred to home health PT in the past.  Pending above recommendations, can place another referral to allow improvement in balance and strength.

## 2019-08-27 NOTE — Assessment & Plan Note (Addendum)
3.5 cm distal AAA on Lumbar Spinal MRI 03/2018 Monitoring recommended every 6 months. Plan to discuss repeat ultrasound at follow up visit

## 2019-09-07 ENCOUNTER — Encounter: Payer: Self-pay | Admitting: Physical Medicine and Rehabilitation

## 2019-09-14 DIAGNOSIS — N35013 Post-traumatic anterior urethral stricture: Secondary | ICD-10-CM | POA: Diagnosis not present

## 2019-09-14 DIAGNOSIS — N3020 Other chronic cystitis without hematuria: Secondary | ICD-10-CM | POA: Diagnosis not present

## 2019-09-14 DIAGNOSIS — R350 Frequency of micturition: Secondary | ICD-10-CM | POA: Diagnosis not present

## 2019-09-27 ENCOUNTER — Encounter: Payer: Self-pay | Admitting: Physical Medicine and Rehabilitation

## 2019-09-27 ENCOUNTER — Other Ambulatory Visit: Payer: Self-pay

## 2019-09-27 ENCOUNTER — Encounter
Payer: Medicare Other | Attending: Physical Medicine and Rehabilitation | Admitting: Physical Medicine and Rehabilitation

## 2019-09-27 VITALS — BP 121/90 | HR 79 | Temp 98.9°F | Ht 76.0 in | Wt 213.8 lb

## 2019-09-27 DIAGNOSIS — G8929 Other chronic pain: Secondary | ICD-10-CM | POA: Diagnosis not present

## 2019-09-27 DIAGNOSIS — M48062 Spinal stenosis, lumbar region with neurogenic claudication: Secondary | ICD-10-CM | POA: Insufficient documentation

## 2019-09-27 DIAGNOSIS — M545 Low back pain: Secondary | ICD-10-CM | POA: Diagnosis not present

## 2019-09-27 DIAGNOSIS — R269 Unspecified abnormalities of gait and mobility: Secondary | ICD-10-CM | POA: Diagnosis not present

## 2019-09-27 MED ORDER — TRAMADOL HCL 50 MG PO TABS: 50.0000 mg | ORAL_TABLET | Freq: Four times a day (QID) | ORAL | 0 refills | Status: DC | PRN

## 2019-09-27 MED ORDER — DULOXETINE HCL 30 MG PO CPEP
30.0000 mg | ORAL_CAPSULE | Freq: Every day | ORAL | 5 refills | Status: DC
Start: 2019-09-27 — End: 2019-11-29

## 2019-09-27 NOTE — Patient Instructions (Addendum)
  Pt is a 74 yr old male with Spinal stenosis and Neurogenic claudication. Causing back and leg pain with walking.     1.  Duloxetine /Cymbalta 30 mg nightly x 1 week  Then 60 mg nightly- for nerve pain  1% of patients can have nausea with Duloxetine- call me if needs an anti-nausea medicine. Only lasts 7 days.  Can also cause mild dry mouth/dry eyes and mild constipation.  2. Tramadol 50 mg up to 4x/day.  Some of my patients will take 100 mg 2x/day as needed.    3. Tylenol- can take with Tramadol to make it stronger and last longer.   4. Suggest stool softener or mild laxative- tramadol and Duloxetine both cause mild constipation.   5. Going to do home exercise program 5 DAYS/week- no matter what.  - exercise ball- sit on it. Sits on it instead of sitting on couch  To help work on balance.   6. F/U in 6 weeks.  Call me to get refill of Tramadol when due. If stays on the tramadol, will start on opiate contract, etc.   If need to, will try for injections in future.

## 2019-09-27 NOTE — Progress Notes (Signed)
Subjective:    Patient ID: Brian Burns, male    DOB: 1945/06/23, 74 y.o.   MRN: [id number]  HPI Patient is a 74 yr old male with hx  Of DM- diet controlled- , AAA, CVA a few years ago;  Afib, CAD, PVD, and B/L knee pain here for spinal stenosis evaluation of chronic low back pain.   Pt c/o low back pain.   Gets stiff if stands or sits for too long.  In low part of back.   On average day, is 7-8/10-  A 10/10 is not getting OOB at all.    Sitting here right now is 6/10.    Tried: Gabapentin- daughter weaned him off it. Very high dose Takes tylenol-  PT- helped for a little while- encourages him to do daily exercises-  2x/week usually does back exercises.    Daily goes to mailbox- from here to road outside office; sits on porch several times/day-  Walks up and down hallways Not driving anymore- wasn't the stroke- rxn time slow- used to walk to city bus-  Using rollator- Using January 2020- pain getting worse.  Doesn't cook anymore or household chores anymore.  Longer to walk. Same distances   Cannot do Celebrex due to Afib- Xarelto Interested in "injection therapy".   They are OK with coming off Xarelto for a little while, it sounds like, before can do injections.     In home uses cane; outside RW; rollator when "out, out".       Pain Inventory Average Pain 7 Pain Right Now 3 My pain is dull  In the last 24 hours, has pain interfered with the following? General activity 7 Relation with others 6 Enjoyment of life 8 What TIME of day is your pain at its worst? night Sleep (in general) Fair  Pain is worse with: bending and sitting Pain improves with: rest Relief from Meds: na  walk without assistance how many minutes can you walk? 5 ability to climb steps?  yes do you drive?  no  disabled: date disabled 10/2013  trouble walking loss of taste or smell  new pt  new pt    Family History  Problem Relation Age of Onset  . Lung disease Brother    . Kidney disease Brother   . Heart disease Brother        died at 20  . Heart disease Mother        died at 35  . Kidney disease Mother   . Lung disease Mother    Social History   Socioeconomic History  . Marital status: Married    Spouse name: Reynol Arnone  . Number of children: 2   . Years of education: 7  . Highest education level: 7th grade  Occupational History  . 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 Occupation: BINGO    Comment: on the weekends  Tobacco Use  . Smoking status: Former Smoker    Packs/day: 0.50    Years: 40.00    Pack years: 20.00    Types: Cigarettes    Quit date: 01/29/1999    Years since quitting: 20.6  . Smokeless tobacco: Never Used  Vaping Use  . Vaping Use: Never used  Substance and Sexual Activity  . Alcohol use: No    Alcohol/week: 0.0 standard drinks    Comment: hx of quit in 2006  . Drug use: Never  . Sexual activity: Not Currently  Other Topics Concern  . Not on file  Social History Narrative   Mr Mcinturff lives with his wife, Jarryd Gratz, in a one story, single family home dwelling with 4 steps into the home.    The Malizia's receive Food & Nutrition Benefits and Social Security Income   Mr AUGUSTINO SAVASTANO has given permission to share his private health information with his daughter, [name] ([phone]) 12/03/18   [name] is the agent for [name] Power of [name]- The document was signed by patient and Notarized in [facility] office on 12/03/18.    Ms Samule Ohm manages her father's finances   Social Determinants of Health   Financial Resource Strain:   . Difficulty of Paying Living Expenses: Not on file  Food Insecurity: Food Insecurity Present  . Worried About Programme researcher, broadcasting/film/video in the Last Year: Sometimes true  . Ran Out of Food in the Last Year: Sometimes true  Transportation Needs:   . Lack of Transportation (Medical): Not on file  . Lack of Transportation  (Non-Medical): Not on file  Physical Activity:   . Days of Exercise per Week: Not on file  . Minutes of Exercise per Session: Not on file  Stress:   . Feeling of Stress : Not on file  Social Connections:   . Frequency of Communication with Friends and Family: Not on file  . Frequency of Social Gatherings with Friends and Family: Not on file  . Attends Religious Services: Not on file  . Active Member of Clubs or Organizations: Not on file  . Attends Banker Meetings: Not on file  . Marital Status: Not on file   Past Surgical History:  Procedure Laterality Date  . back injections     [facility]  . BALLOON DILATION N/A 12/16/2018   Procedure: URETHRAL BALLOON DILATION;  Surgeon: Crist Fat, MD;  Location: [facility];  Service: Urology;  Laterality: N/A;  . CARDIOVASCULAR STRESS TEST  08-09-2009   mild global hypokinesis,  no ischemia or scar/  ef 41%  . CATARACT EXTRACTION W/ INTRAOCULAR LENS  IMPLANT, BILATERAL  april and may 2014  . CYSTOSCOPY N/A 01/17/2017   Procedure: CYSTOSCOPY FLEXIBLE;  Surgeon: Crist Fat, MD;  Location: [facility];  Service: Urology;  Laterality: N/A;  . CYSTOSCOPY WITH RETROGRADE URETHROGRAM N/A 11/24/2014   Procedure: CYSTOSCOPY WITH RETROGRADE URETHROGRAM;  Surgeon: Crist Fat, MD;  Location: [facility];  Service: Urology;  Laterality: N/A;  . CYSTOSCOPY WITH RETROGRADE URETHROGRAM N/A 12/16/2018   Procedure: CYSTOSCOPY WITH RETROGRADE URETHROGRAM;  Surgeon: Crist Fat, MD;  Location: [facility];  Service: Urology;  Laterality: N/A;  . CYSTOSCOPY WITH URETHRAL DILATATION N/A 11/24/2014   Procedure: CYSTOSCOPY WITH URETHRAL DILATATION;  Surgeon: Crist Fat, MD;  Location: [facility];  Service: Urology;  Laterality: N/A;  BALLOON DILATION        . ESOPHAGOGASTRODUODENOSCOPY N/A 05/15/2014   Procedure: ESOPHAGOGASTRODUODENOSCOPY (EGD);  Surgeon: Carman Ching, MD;   Location[facility];  Service: Endoscopy;  Laterality: N/A;  . ESOPHAGOGASTRODUODENOSCOPY Left 02/02/2015   Procedure: ESOPHAGOGASTRODUODENOSCOPY (EGD);  Surgeon: Dorena Cookey, MD;  Location: [facility]OSCOPY;  Service: Endoscopy;  Laterality: Left;  . ESOPHAGOGASTRODUODENOSCOPY Left 03/16/2015   Procedure: ESOPHAGOGASTRODUODENOSCOPY (EGD);  Surgeon: Dorena Cookey, MD;  Location: [facility];  Service: Endoscopy;  Laterality: Left;  . TRANSTHORACIC ECHOCARDIOGRAM  01-07-2012   mild to moderate dilated LV,  ef 45-50%,  mild basal hypokinesis,  grade I diastolic  dysfunction/  trivial AR/  mild MR  . URETHROPLASTY N/A 01/17/2017   Procedure: URETHEROPLASTY BUCCAL GRAFT AND BUCCAL MUCOSA GRAFT HARVEST;  Surgeon: Crist Fat, MD;  Location: [facility];  Service: Urology;  Laterality: N/A;   Past Medical History:  Diagnosis Date  . AAA (abdominal aortic aneurysm) without rupture (HCC) 06/2014   no change in last exam from 2015  . Achalasia   . Arthritis    knees,lower back  . Atrial fibrillation (HCC)   . Atrial flutter with rapid ventricular response (HCC) 01/16/2015  . Bilateral wrist pain 09/09/2014  . Bladder outlet obstruction 01/20/2015  . Bradycardia 08/27/2016  . CAD (coronary artery disease)    pt denies  . Carotid artery disease (HCC)   . Chronic back pain   . Chronic kidney disease (CKD), stage III (moderate)   . Decreased visual acuity of left eye  12/02/2018  . Dyspnea    reports today no pregoression  . Erectile dysfunction   . GERD (gastroesophageal reflux disease)   . Headache    hx of  . History of CVA (cerebrovascular accident)    2009  &  2013  . History of Multiple lacunar infarcts (HCC) 01/31/2015   MRI Brain 01/31/2015: Widespread old lacunar infarcts, particularly right Basal ganglia, pons, and right cerebellum  . HLD (hyperlipidemia)   . Hypertension   . Idiopathic pancreatitis 12/2014   acute  . Knee pain, bilateral 04/09/2011  . Malnutrition of moderate degree  01/25/2015  . Memory impairment    mild dementia per PCP progress note   . Morbid obesity (HCC) 12/03/2018  . Other abnormalities of gait and mobility 12/03/2018  . Pre-diabetes    per pt. no meds  . PVD (peripheral vascular disease) with claudication (HCC)   . Urethral stricture 02/16/2013  . Urge incontinence 12/08/2018   BP 121/90   Pulse 79   Temp 98.9 F (37.2 C)   Ht 6\' 4"  (1.93 m)   Wt 213 lb 12.8 oz (97 kg)   SpO2 94%   BMI 26.02 kg/m   Opioid Risk Score:   Fall Risk Score:  `1  Depression screen PHQ 2/9  Depression screen [facility] 2/9 09/27/2019 08/25/2019 06/18/2019 12/03/2018 10/29/2018 06/17/2018 04/15/2018  Decreased Interest 2 0 0 1 0 1 0  Down, Depressed, Hopeless 0 0 0 0 0 0 0  PHQ - 2 Score 2 0 0 1 0 1 0  Altered sleeping 2 0 - - - - -  Tired, decreased energy 1 0 - - - - -  Change in appetite 2 - - - - - -  Feeling bad or failure about yourself  1 0 - - - - -  Trouble concentrating 1 0 - - - - -  Moving slowly or fidgety/restless 2 0 - - - - -  Suicidal thoughts 0 0 - - - - -  PHQ-9 Score 11 0 - - - - -  Difficult doing work/chores Somewhat difficult - - - - - -  Some recent data might be hidden   Review of Systems  Musculoskeletal: Positive for back pain and gait problem.  All other systems reviewed and are negative.      Objective:   Physical Exam  Awake, alert, appropriate, accompanied by daughter, NAD Has RW MS:  LEs- HF 5-/5, KE, 5-/5 on R; 5/5 on L; DF 5/5 B/L; PF 5-/5 on R; 5/5 on L With lumbar extension, pain MUCH worse/increases  Gait- crouched, flexed posture-  leaning heavily on RW.     Neuro: Sensation intact to light touch in LEs UEs DTRs 2+; trace in LEs B/L    (last A1c 6.7 04/2019)      Assessment & Plan:    Pt is a 74 yr old male with Spinal stenosis and Neurogenic claudication. Causing back and leg pain with walking.     1.  Duloxetine /Cymbalta 30 mg nightly x 1 week  Then 60 mg nightly- for nerve pain  1% of patients  can have nausea with Duloxetine- call me if needs an anti-nausea medicine. Only lasts 7 days.  Can also cause mild dry mouth/dry eyes and mild constipation.  2. Tramadol 50 mg up to 4x/day.  Some of my patients will take 100 mg 2x/day as needed.    3. Tylenol- can take with Tramadol to make it stronger and last longer.   4. Suggest stool softener or mild laxative- tramadol and Duloxetine both cause mild constipation.   5. Going to do home exercise program 5 DAYS/week- no matter what.  - exercise ball- sit on it. Sits on it instead of sitting on couch  To help work on balance.   6. F/U in 6 weeks.  Call me to get refill of Tramadol when due.   If stays on the tramadol, will start on opiate contract, etc.   If need to, will try for injections in future.   I spent a total of 50 minute on visit- as detailed above.

## 2019-11-02 ENCOUNTER — Encounter: Payer: Self-pay | Admitting: Podiatry

## 2019-11-02 ENCOUNTER — Other Ambulatory Visit: Payer: Self-pay

## 2019-11-02 ENCOUNTER — Ambulatory Visit (INDEPENDENT_AMBULATORY_CARE_PROVIDER_SITE_OTHER): Payer: Medicare Other | Admitting: Podiatry

## 2019-11-02 DIAGNOSIS — L84 Corns and callosities: Secondary | ICD-10-CM

## 2019-11-02 DIAGNOSIS — E119 Type 2 diabetes mellitus without complications: Secondary | ICD-10-CM

## 2019-11-02 DIAGNOSIS — M79675 Pain in left toe(s): Secondary | ICD-10-CM

## 2019-11-02 DIAGNOSIS — B351 Tinea unguium: Secondary | ICD-10-CM | POA: Diagnosis not present

## 2019-11-02 DIAGNOSIS — M79674 Pain in right toe(s): Secondary | ICD-10-CM

## 2019-11-05 ENCOUNTER — Encounter: Payer: Medicare Other | Admitting: Physical Medicine and Rehabilitation

## 2019-11-06 NOTE — Progress Notes (Addendum)
Subjective:  Patient ID: Brian Burns, male    DOB: 01-22-1946,  MRN: [id number]  74 y.o. male presents with preventative diabetic foot care and painful callus(es) b/l and painful thick toenails that are difficult to trim. Painful toenails interfere with ambulation. Aggravating factors include wearing enclosed shoe gear. Pain is relieved with periodic professional debridement. Painful calluses are aggravated when weightbearing with and without shoegear. Pain is relieved with periodic professional debridement.   He states the callus on the right great toe is bothering him.    Review of Systems: Negative except as noted in the HPI. Denies N/V/F/Ch.  Past Medical History:  Diagnosis Date  . AAA (abdominal aortic aneurysm) without rupture (HCC) 06/2014   no change in last exam from 2015  . Achalasia   . Arthritis    knees,lower back  . Atrial fibrillation (HCC)   . Atrial flutter with rapid ventricular response (HCC) 01/16/2015  . Bilateral wrist pain 09/09/2014  . Bladder outlet obstruction 01/20/2015  . Bradycardia 08/27/2016  . CAD (coronary artery disease)    pt denies  . Carotid artery disease (HCC)   . Chronic back pain   . Chronic kidney disease (CKD), stage III (moderate) (HCC)   . Decreased visual acuity of left eye  12/02/2018  . Dyspnea    reports today no pregoression  . Erectile dysfunction   . GERD (gastroesophageal reflux disease)   . Headache    hx of  . History of CVA (cerebrovascular accident)    2009  &  2013  . History of Multiple lacunar infarcts (HCC) 01/31/2015   MRI Brain 01/31/2015: Widespread old lacunar infarcts, particularly right Basal ganglia, pons, and right cerebellum  . HLD (hyperlipidemia)   . Hypertension   . Idiopathic pancreatitis 12/2014   acute  . Knee pain, bilateral 04/09/2011  . Malnutrition of moderate degree 01/25/2015  . Memory impairment    mild dementia per PCP progress note   . Morbid obesity (HCC) 12/03/2018  . Other abnormalities  of gait and mobility 12/03/2018  . Pre-diabetes    per pt. no meds  . PVD (peripheral vascular disease) with claudication (HCC)   . Urethral stricture 02/16/2013  . Urge incontinence 12/08/2018   Past Surgical History:  Procedure Laterality Date  . back injections     [facility]  . BALLOON DILATION N/A 12/16/2018   Procedure: URETHRAL BALLOON DILATION;  Surgeon: Crist Fat, MD;  Location: [facility];  Service: Urology;  Laterality: N/A;  . CARDIOVASCULAR STRESS TEST  08-09-2009   mild global hypokinesis,  no ischemia or scar/  ef 41%  . CATARACT EXTRACTION W/ INTRAOCULAR LENS  IMPLANT, BILATERAL  april and may 2014  . CYSTOSCOPY N/A 01/17/2017   Procedure: CYSTOSCOPY FLEXIBLE;  Surgeon: Crist Fat, MD;  Location: [facility];  Service: Urology;  Laterality: N/A;  . CYSTOSCOPY WITH RETROGRADE URETHROGRAM N/A 11/24/2014   Procedure: CYSTOSCOPY WITH RETROGRADE URETHROGRAM;  Surgeon: Crist Fat, MD;  Location: [facility];  Service: Urology;  Laterality: N/A;  . CYSTOSCOPY WITH RETROGRADE URETHROGRAM N/A 12/16/2018   Procedure: CYSTOSCOPY WITH RETROGRADE URETHROGRAM;  Surgeon: Crist Fat, MD;  Location: [facility];  Service: Urology;  Laterality: N/A;  . CYSTOSCOPY WITH URETHRAL DILATATION N/A 11/24/2014   Procedure: CYSTOSCOPY WITH URETHRAL DILATATION;  Surgeon: Crist Fat, MD;  Location: [facility];  Service: Urology;  Laterality: N/A;  BALLOON DILATION        . ESOPHAGOGASTRODUODENOSCOPY N/A 05/15/2014  Procedure: ESOPHAGOGASTRODUODENOSCOPY (EGD);  Surgeon: Carman Ching, MD;  Location: [facility] END[facility]COPY;  Service: Endoscopy;  Laterality: N/A;  . ESOPHAGOGASTRODUODENOSCOPY Left 02/02/2015   Procedure: ESOPHAGOGASTRODUODENOSCOPY (EGD);  Surgeon: Dorena Cookey, MD;  Location: [facility];  Service: Endoscopy;  Laterality: Left;  . ESOPHAGOGASTRODUODENOSCOPY Left 03/16/2015   Procedure: ESOPHAGOGASTRODUODENOSCOPY (EGD);   Surgeon: Dorena Cookey, MD;  Location: [facility];  Service: Endoscopy;  Laterality: Left;  . TRANSTHORACIC ECHOCARDIOGRAM  01-07-2012   mild to moderate dilated LV,  ef 45-50%,  mild basal hypokinesis,  grade I diastolic  dysfunction/  trivial AR/  mild MR  . URETHROPLASTY N/A 01/17/2017   Procedure: URETHEROPLASTY BUCCAL GRAFT AND BUCCAL MUCOSA GRAFT HARVEST;  Surgeon: Crist Fat, MD;  Location: [facility];  Service: Urology;  Laterality: N/A;   Patient Active Problem List   Diagnosis Date Noted  . Abnormality of gait 09/27/2019  . Seizure (HCC)   . Sepsis (HCC) 05/25/2019  . Urge incontinence 12/08/2018  . Stenosis of lateral recess of lumbar spine 12/08/2018  . Dementia, vascular (HCC) 12/08/2018  . Overweight (BMI 25.0-29.9) 12/03/2018  . Decreased visual acuity of left eye  12/02/2018  . Gross hematuria 10/29/2018  . Other abnormalities of gait and mobility 09/02/2018  . Muscle weakness (generalized) 04/08/2018  . Lumbar spinal stenosis 04/05/2018  . Chronic pain of right knee 03/07/2017  . Chronic low back pain without sciatica 03/07/2017  . Nonischemic cardiomyopathy (HCC) 09/25/2016  . Microalbuminuria 07/26/2015  . Paroxysmal atrial fibrillation (HCC)   . Coronary artery disease involving native coronary artery of native heart without angina pectoris   . Stricture, urethra   . CKD stage 3 due to type 2 diabetes mellitus (HCC) 03/16/2015  . Depression 03/16/2015  . Normocytic anemia   . History of Multiple lacunar infarcts (HCC) 01/31/2015  . GERD (gastroesophageal reflux disease) 10/21/2013  . AAA (abdominal aortic aneurysm) without rupture (HCC) 06/24/2013  . PVD (peripheral vascular disease) (HCC) 06/24/2013  . Orthostatic dizziness 03/18/2013  . Hearing loss of both ears 03/18/2013  . BPH (benign prostatic hyperplasia) 02/16/2013  . Type 2 diabetes mellitus with diabetic neuropathy, unspecified (HCC) 05/10/2012  . Type 2 diabetes mellitus with  hypercholesterolemia (HCC) 02/05/2007  . Hypertension associated with diabetes (HCC) 03/27/2006    Current Outpatient Medications:  .  acetaminophen (TYLENOL) 325 MG tablet, Take 325 mg by mouth every 6 (six) hours as needed for moderate pain., Disp: , Rfl:  .  amLODipine (NORVASC) 10 MG tablet, Take 10 mg by mouth daily., Disp: , Rfl:  .  atorvastatin (LIPITOR) 40 MG tablet, Take 1 tablet (40 mg total) by mouth daily., Disp: 90 tablet, Rfl: 2 .  cephALEXin (KEFLEX) 250 MG capsule, Take 250 mg by mouth at bedtime., Disp: , Rfl:  .  citalopram (CELEXA) 40 MG tablet, Take 40 mg by mouth daily., Disp: , Rfl:  .  DULoxetine (CYMBALTA) 30 MG capsule, Take 1 capsule (30 mg total) by mouth at bedtime. X 1 week, then 60 mg nightly- for nerve and back pain (reduce Citalopram to 20 mg), Disp: 60 capsule, Rfl: 5 .  ergocalciferol (VITAMIN D2) 1.25 MG (50000 UT) capsule, Take 1 capsule (50,000 Units total) by mouth once a week., Disp: 4 capsule, Rfl: 0 .  ferrous sulfate 324 MG TBEC, Take 1 tablet (324 mg total) by mouth daily with breakfast., Disp: 30 tablet, Rfl: 0 .  finasteride (PROSCAR) 5 MG tablet, Take 1 tablet (5 mg total) by mouth at bedtime., Disp: 30 tablet, Rfl: 0 .  lipase/protease/amylase (CREON) 12000-38000 units CPEP capsule, take 1 capsule by mouth three times a day with meals, Disp: 90 capsule, Rfl: 0 .  lisinopril (ZESTRIL) 5 MG tablet, Take 1 tablet (5 mg total) by mouth at bedtime., Disp: 90 tablet, Rfl: 3 .  omeprazole (PRILOSEC) 20 MG capsule, Take 1 capsule (20 mg total) by mouth daily., Disp: 30 capsule, Rfl: 0 .  Rivaroxaban (XARELTO) 15 MG TABS tablet, Take 1 tablet (15 mg total) by mouth at bedtime. With a meal., Disp: 30 tablet, Rfl: 0 .  sulfamethoxazole-trimethoprim (BACTRIM DS) 800-160 MG tablet, Take 1 tablet by mouth 2 (two) times daily., Disp: , Rfl:  .  tamsulosin (FLOMAX) 0.4 MG CAPS capsule, TAKE 1 CAPSULE(0.4 MG) BY MOUTH DAILY, Disp: 30 capsule, Rfl: 0 .  traMADol  (ULTRAM) 50 MG tablet, Take 1 tablet (50 mg total) by mouth every 6 (six) hours as needed., Disp: 28 tablet, Rfl: 0   Allergies  Allergen Reactions  . Gabapentin Other (See Comments)    Sedation at 300mg  TID   Social History   Tobacco Use  Smoking Status Former Smoker  . Packs/day: 0.50  . Years: 40.00  . Pack years: 20.00  . Types: Cigarettes  . Quit date: 01/29/1999  . Years since quitting: 20.7  Smokeless Tobacco Never Used     Objective:   Constitutional Well developed. Well nourished.  Vascular Dorsalis pedis pulses palpable bilaterally. Posterior tibial pulses palpable bilaterally. Capillary refill normal to all digits.  No cyanosis or clubbing noted. Pedal hair growth absent b/l.   Neurologic Normal speech. Oriented to person, place, and time. Epicritic sensation to light touch grossly present bilaterally.  Dermatologic Toenails 1-5 b/l are painful, elongated, discolored, dystrophic with subungual debris. Pain with dorsal palpation of nailplates. No erythema, no edema, no drainage noted. No open wounds. No skin lesions. Hyperkeratotic lesion medial aspect left hallux, lateral aspect right hallux .  Orthopedic: Normal joint ROM without pain or crepitus bilaterally. No visible deformities. No bony tenderness.   Radiographs: None Assessment:   1. Pain due to onychomycosis of toenails of both feet   2. Callus   3. Controlled type 2 diabetes mellitus without complication, without long-term current use of insulin (HCC)    Plan:  Patient was evaluated and treated and all questions answered.  Onychomycosis with pain -Nails palliatively debridement as below. -Educated on self-care  Procedure: Nail Debridement Rationale: Pain Type of Debridement: manual, sharp debridement. Instrumentation: Nail nipper, rotary burr. Number of Nails: 10  -Examined patient. -Continue diabetic foot care principles. -Toenails 1-5 b/l were debrided in length and girth with sterile nail  nippers and dremel without iatrogenic bleeding.  -Callus(es) L hallux and R hallux pared utilizing sterile scalpel blade without complication or incident. Total number debrided =2. -Patient to report any pedal injuries to medical professional immediately. -Patient to continue soft, supportive shoe gear daily. -Patient/POA to call should there be question/concern in the interim.  Return in about 3 months (around 02/02/2020).  04/01/2020, DPM

## 2019-11-10 DIAGNOSIS — N390 Urinary tract infection, site not specified: Secondary | ICD-10-CM | POA: Diagnosis not present

## 2019-11-10 DIAGNOSIS — N189 Chronic kidney disease, unspecified: Secondary | ICD-10-CM | POA: Diagnosis not present

## 2019-11-10 DIAGNOSIS — N1831 Chronic kidney disease, stage 3a: Secondary | ICD-10-CM | POA: Diagnosis not present

## 2019-11-16 DIAGNOSIS — I129 Hypertensive chronic kidney disease with stage 1 through stage 4 chronic kidney disease, or unspecified chronic kidney disease: Secondary | ICD-10-CM | POA: Diagnosis not present

## 2019-11-16 DIAGNOSIS — N1831 Chronic kidney disease, stage 3a: Secondary | ICD-10-CM | POA: Diagnosis not present

## 2019-11-16 DIAGNOSIS — D631 Anemia in chronic kidney disease: Secondary | ICD-10-CM | POA: Diagnosis not present

## 2019-11-16 DIAGNOSIS — R809 Proteinuria, unspecified: Secondary | ICD-10-CM | POA: Diagnosis not present

## 2019-11-16 DIAGNOSIS — N2581 Secondary hyperparathyroidism of renal origin: Secondary | ICD-10-CM | POA: Diagnosis not present

## 2019-11-19 ENCOUNTER — Other Ambulatory Visit: Payer: Self-pay | Admitting: Physical Medicine and Rehabilitation

## 2019-11-29 ENCOUNTER — Encounter
Payer: Medicare Other | Attending: Physical Medicine and Rehabilitation | Admitting: Physical Medicine and Rehabilitation

## 2019-11-29 ENCOUNTER — Encounter: Payer: Self-pay | Admitting: Physical Medicine and Rehabilitation

## 2019-11-29 ENCOUNTER — Other Ambulatory Visit: Payer: Self-pay

## 2019-11-29 VITALS — BP 154/96 | HR 64 | Ht 76.0 in | Wt 203.6 lb

## 2019-11-29 DIAGNOSIS — R269 Unspecified abnormalities of gait and mobility: Secondary | ICD-10-CM

## 2019-11-29 DIAGNOSIS — M48062 Spinal stenosis, lumbar region with neurogenic claudication: Secondary | ICD-10-CM | POA: Insufficient documentation

## 2019-11-29 DIAGNOSIS — M48061 Spinal stenosis, lumbar region without neurogenic claudication: Secondary | ICD-10-CM | POA: Diagnosis not present

## 2019-11-29 MED ORDER — DULOXETINE HCL 60 MG PO CPEP: 60.0000 mg | ORAL_CAPSULE | Freq: Every day | ORAL | 5 refills | Status: DC

## 2019-11-29 NOTE — Progress Notes (Signed)
Subjective:    Patient ID: Brian Burns, male    DOB: 03-29-1945, 74 y.o.   MRN: [id number]  HPI   Patient is a 74 yr old male with hx  Of DM- diet controlled- , AAA, CVA a few years ago;  Afib, CAD, PVD, and B/L knee pain here for spinal stenosis f/u of chronic low back pain.   Here for f/u.    Doing good/better per pt.  Feels like pain is a little better.   Last visit said pain was 7-8/10 this visit is 3-4/10.   Was so out of it, when using tramadol daily, so moved to QOD.   Still in pain,  Per daughter  Brian Burns to mailbox every day- moving about the same.  Not standing upright as much- leaning more- appears to be more to Left.   Still taking Duloxetine- a source of him feeling better.   Has had 3-4 bad days in the last week.  Not sure if it relates to the days he gets tramadol or not.   Usually gives 1 pill at night QOD of tramadol.     Pain Inventory Average Pain 4 Pain Right Now 3 My pain is stabbing and tingling  In the last 24 hours, has pain interfered with the following? General activity 9 Relation with others 5 Enjoyment of life 9 What TIME of day is your pain at its worst? night Sleep (in general) Good  Pain is worse with: walking, bending and standing Pain improves with: rest and medication Relief from Meds: 2  Family History  Problem Relation Age of Onset  . Lung disease Brother   . Kidney disease Brother   . Heart disease Brother        died at 44  . Heart disease Mother        died at 44  . Kidney disease Mother   . Lung disease Mother    Social History   Socioeconomic History  . Marital status: Married    Spouse name: Brian Burns  . Number of children: 2   . Years of education: 7  . Highest education level: 7th grade  Occupational History  . 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 Occupation: B[employer]    Comment: on the weekends  Tobacco Use  . Smoking status: Former Smoker    Packs/day:  0.50    Years: 40.00    Pack years: 20.00    Types: Cigarettes    Quit date: 01/29/1999    Years since quitting: 20.8  . Smokeless tobacco: Never Used  Vaping Use  . Vaping Use: Never used  Substance and Sexual Activity  . Alcohol use: No    Alcohol/week: 0.0 standard drinks    Comment: hx of quit in 2006  . Drug use: Never  . Sexual activity: Not Currently  Other Topics Concern  . Not on file  Social History Narrative   Brian Farrelly lives with his wife, Brian Burns, in a one story, single family home dwelling with 4 steps into the home.    The Shock's receive Food & Nutrition Benefits and Social Security Income   Brian LESSLIE MCKEEHAN has given permission to share his private health information with his daughter, Brian Burns ([phone]) 12/03/18   Brian Burns is the agent for Brian Burns Power of Brian Burns- The document was signed by patient and Notarized in [facility] office on 12/03/18.    Brian Burns  manages her father's finances   Social Determinants of Health   Financial Resource Strain:   . Difficulty of Paying Living Expenses: Not on file  Food Insecurity: Food Insecurity Present  . Worried About Programme researcher, broadcasting/film/video in the Last Year: Sometimes true  . Ran Out of Food in the Last Year: Sometimes true  Transportation Needs:   . Lack of Transportation (Medical): Not on file  . Lack of Transportation (Non-Medical): Not on file  Physical Activity:   . Days of Exercise per Week: Not on file  . Minutes of Exercise per Session: Not on file  Stress:   . Feeling of Stress : Not on file  Social Connections:   . Frequency of Communication with Burns and Family: Not on file  . Frequency of Social Gatherings with Burns and Family: Not on file  . Attends Religious Services: Not on file  . Active Member of Clubs or Organizations: Not on file  . Attends Banker Meetings: Not on file  . Marital Status: Not on file   Past Surgical History:  Procedure Laterality  Date  . back injections     [facility]  . BALLOON DILATION N/A 12/16/2018   Procedure: URETHRAL BALLOON DILATION;  Surgeon: Crist Fat, MD;  Location: [facility];  Service: Urology;  Laterality: N/A;  . CARDIOVASCULAR STRESS TEST  08-09-2009   mild global hypokinesis,  no ischemia or scar/  ef 41%  . CATARACT EXTRACTION W/ INTRAOCULAR LENS  IMPLANT, BILATERAL  april and may 2014  . CYSTOSCOPY N/A 01/17/2017   Procedure: CYSTOSCOPY FLEXIBLE;  Surgeon: Crist Fat, MD;  Location: [facility];  Service: Urology;  Laterality: N/A;  . CYSTOSCOPY WITH RETROGRADE URETHROGRAM N/A 11/24/2014   Procedure: CYSTOSCOPY WITH RETROGRADE URETHROGRAM;  Surgeon: Crist Fat, MD;  Location: [facility];  Service: Urology;  Laterality: N/A;  . CYSTOSCOPY WITH RETROGRADE URETHROGRAM N/A 12/16/2018   Procedure: CYSTOSCOPY WITH RETROGRADE URETHROGRAM;  Surgeon: Crist Fat, MD;  [facility];  Service: Urology;  Laterality: N/A;  . CYSTOSCOPY WITH URETHRAL DILATATION N/A 11/24/2014   Procedure: CYSTOSCOPY WITH URETHRAL DILATATION;  Surgeon: Crist Fat, MD;  Location: [facility];  Service: Urology;  Laterality: N/A;  BALLOON DILATION        . ESOPHAGOGASTRODUODENOSCOPY N/A 05/15/2014   Procedure: ESOPHAGOGASTRODUODENOSCOPY (EGD);  Surgeon: Carman Ching, MD;  Location: [facility];  Service: Endoscopy;  Laterality: N/A;  . ESOPHAGOGASTRODUODENOSCOPY Left 02/02/2015   Procedure: ESOPHAGOGASTRODUODENOSCOPY (EGD);  Surgeon: Dorena Cookey, MD;  Location: [facility];  Service: Endoscopy;  Laterality: Left;  . ESOPHAGOGASTRODUODENOSCOPY Left 03/16/2015   Procedure: ESOPHAGOGASTRODUODENOSCOPY (EGD);  Surgeon: Dorena Cookey, MD;  Location: [facility];  Service: Endoscopy;  Laterality: Left;  . TRANSTHORACIC ECHOCARDIOGRAM  01-07-2012   mild to moderate dilated LV,  ef 45-50%,  mild basal hypokinesis,  grade I diastolic  dysfunction/  trivial AR/   mild Brian  . URETHROPLASTY N/A 01/17/2017   Procedure: URETHEROPLASTY BUCCAL GRAFT AND BUCCAL MUCOSA GRAFT HARVEST;  Surgeon: Crist Fat, MD;  [facility];  Service: Urology;  Laterality: N/A;   Past Surgical History:  Procedure Laterality Date  . back injections     [facility]  . BALLOON DILATION N/A 12/16/2018   Procedure: URETHRAL BALLOON DILATION;  Surgeon: Crist Fat, MD;  [facility];  Service: Urology;  Laterality: N/A;  . CARDIOVASCULAR STRESS TEST  08-09-2009   mild global hypokinesis,  no ischemia or scar/  ef 41%  . CATARACT EXTRACTION W/ INTRAOCULAR LENS  IMPLANT, BILATERAL  april and may 2014  . CYSTOSCOPY N/A 01/17/2017   Procedure: CYSTOSCOPY FLEXIBLE;  Surgeon: Crist Fat, MD;  [facility];  Service: Urology;  Laterality: N/A;  . CYSTOSCOPY WITH RETROGRADE URETHROGRAM N/A 11/24/2014   Procedure: CYSTOSCOPY WITH RETROGRADE URETHROGRAM;  Surgeon: Crist Fat, MD;  Location[facility];  Service: Urology;  Laterality: N/A;  . CYSTOSCOPY WITH RETROGRADE URETHROGRAM N/A 12/16/2018   Procedure: CYSTOSCOPY WITH RETROGRADE URETHROGRAM;  Surgeon: Crist Fat, MD;  [facility];  Service: Urology;  Laterality: N/A;  . CYSTOSCOPY WITH URETHRAL DILATATION N/A 11/24/2014   Procedure: CYSTOSCOPY WITH URETHRAL DILATATION;  Surgeon: Crist Fat, MD;  Location[facility];  Service: Urology;  Laterality: N/A;  BALLOON DILATION        . ESOPHAGOGASTRODUODENOSCOPY N/A 05/15/2014   Procedure: ESOPHAGOGASTRODUODENOSCOPY (EGD);  Surgeon: Carman Ching, MD;  Location: [facility];  Service: Endoscopy;  Laterality: N/A;  . ESOPHAGOGASTRODUODENOSCOPY Left 02/02/2015   Procedure: ESOPHAGOGASTRODUODENOSCOPY (EGD);  Surgeon: Dorena Cookey, MD;  Location: [facility];  Service: Endoscopy;  Laterality: Left;  . ESOPHAGOGASTRODUODENOSCOPY Left 03/16/2015   Procedure: ESOPHAGOGASTRODUODENOSCOPY  (EGD);  Surgeon: Dorena Cookey, MD;  Location: [facility];  Service: Endoscopy;  Laterality: Left;  . TRANSTHORACIC ECHOCARDIOGRAM  01-07-2012   mild to moderate dilated LV,  ef 45-50%,  mild basal hypokinesis,  grade I diastolic  dysfunction/  trivial AR/  mild Brian  . URETHROPLASTY N/A 01/17/2017   Procedure: URETHEROPLASTY BUCCAL GRAFT AND BUCCAL MUCOSA GRAFT HARVEST;  Surgeon: Crist Fat, MD;  Location[facility];  Service: Urology;  Laterality: N/A;   Past Medical History:  Diagnosis Date  . AAA (abdominal aortic aneurysm) without rupture (HCC) 06/2014   no change in last exam from 2015  . Achalasia   . Arthritis    knees,lower back  . Atrial fibrillation (HCC)   . Atrial flutter with rapid ventricular response (HCC) 01/16/2015  . Bilateral wrist pain 09/09/2014  . Bladder outlet obstruction 01/20/2015  . Bradycardia 08/27/2016  . CAD (coronary artery disease)    pt denies  . Carotid artery disease (HCC)   . Chronic back pain   . Chronic kidney disease (CKD), stage III (moderate) (HCC)   . Decreased visual acuity of left eye  12/02/2018  . Dyspnea    reports today no pregoression  . Erectile dysfunction   . GERD (gastroesophageal reflux disease)   . Headache    hx of  . History of CVA (cerebrovascular accident)    2009  &  2013  . History of Multiple lacunar infarcts (HCC) 01/31/2015   MRI Brain 01/31/2015: Widespread old lacunar infarcts, particularly right Basal ganglia, pons, and right cerebellum  . HLD (hyperlipidemia)   . Hypertension   . Idiopathic pancreatitis 12/2014   acute  . Knee pain, bilateral 04/09/2011  . Malnutrition of moderate degree 01/25/2015  . Memory impairment    mild dementia per PCP progress note   . Morbid obesity (HCC) 12/03/2018  . Other abnormalities of gait and mobility 12/03/2018  . Pre-diabetes    per pt. no meds  . PVD (peripheral vascular disease) with claudication (HCC)   . Urethral stricture 02/16/2013  . Urge incontinence 12/08/2018    BP (!) 154/96   Pulse 64   Ht 6\' 4"  (1.93 m)   Wt 203 lb 9.6 oz (92.4 kg)   SpO2 95%   BMI 24.78 kg/m  Opioid Risk Score:   Fall Risk Score:  `1  Depression screen PHQ 2/9  Depression screen [facility] 2/9 09/27/2019 08/25/2019 06/18/2019 12/03/2018 10/29/2018 06/17/2018 04/15/2018  Decreased Interest 2 0 0 1 0 1 0  Down, Depressed, Hopeless 0 0 0 0 0 0 0  PHQ - 2 Score 2 0 0 1 0 1 0  Altered sleeping 2 0 - - - - -  Tired, decreased energy 1 0 - - - - -  Change in appetite 2 - - - - - -  Feeling bad or failure about yourself  1 0 - - - - -  Trouble concentrating 1 0 - - - - -  Moving slowly or fidgety/restless 2 0 - - - - -  Suicidal thoughts 0 0 - - - - -  PHQ-9 Score 11 0 - - - - -  Difficult doing work/chores Somewhat difficult - - - - - -  Some recent data might be hidden    Review of Systems    an entire ROS was completed and found to be negative except for HPI.  Objective:   Physical Exam  Awake, alert, appropriate leaning slightly to right, accompanied by daughter, NAD Has RW with him.  No change in strength exam Leaning to right esp when falls asleep    Assessment & Burns:   Patient is a 74 yr old male with hx  Of DM- diet controlled- , AAA, CVA a few years ago;  Afib, CAD, PVD, and B/L knee pain here for spinal stenosis f/u of chronic low back pain.   1. Will try exercise- after d/w pt, he said he was willing to SIT on exercise ball.  Has to sit on ball at least 30 minutes/day.  Will help back muscles.  Can sit in corner to start for balance. DAILY- 30 MINUTES- can break up in 10-15 minute increments.   2. Will change Duloxetine to 60 mg capsule- whenever he get sit refilled.   3. Got tramadol filled last week. Can break tramadol in half- whenever you needs it- can try it.   4. F/U in 8 weeks- to see how things are going-  Call if issues.   I spent a total of 25 minutes on visit- as detailed above.   4. Will keep doses stable for this visit.

## 2019-11-29 NOTE — Patient Instructions (Signed)
  Patient is a 74 yr old male with hx  Of DM- diet controlled- , AAA, CVA a few years ago;  Afib, CAD, PVD, and B/L knee pain here for spinal stenosis f/u of chronic low back pain.   1. Will try exercise- after d/w pt, he said he was willing to SIT on exercise ball.  Has to sit on ball at least 30 minutes/day.  Will help back muscles. Can sit in corner to start for balance. DAILY- 30 MINUTES- can break up in 10-15 minute increments.   2. Will change Duloxetine to 60 mg capsule- whenever he get sit refilled.   3. Got tramadol filled last week. Can break tramadol in half- whenever you needs it- can try it.   4. F/U in 8 weeks- to see how things are going-  Call if issues.

## 2019-12-02 DIAGNOSIS — N35011 Post-traumatic bulbous urethral stricture: Secondary | ICD-10-CM | POA: Diagnosis not present

## 2019-12-02 DIAGNOSIS — N3020 Other chronic cystitis without hematuria: Secondary | ICD-10-CM | POA: Diagnosis not present

## 2019-12-02 DIAGNOSIS — R300 Dysuria: Secondary | ICD-10-CM | POA: Diagnosis not present

## 2019-12-08 DIAGNOSIS — N35011 Post-traumatic bulbous urethral stricture: Secondary | ICD-10-CM | POA: Diagnosis not present

## 2019-12-08 DIAGNOSIS — N3020 Other chronic cystitis without hematuria: Secondary | ICD-10-CM | POA: Diagnosis not present

## 2019-12-08 DIAGNOSIS — R300 Dysuria: Secondary | ICD-10-CM | POA: Diagnosis not present

## 2019-12-09 ENCOUNTER — Telehealth: Payer: Self-pay

## 2019-12-09 ENCOUNTER — Other Ambulatory Visit: Payer: Self-pay | Admitting: Urology

## 2019-12-09 NOTE — Progress Notes (Signed)
DUE TO COVID-19 ONLY ONE VISITOR IS ALLOWED TO COME WITH YOU AND STAY IN THE WAITING ROOM ONLY DURING PRE OP AND PROCEDURE DAY OF SURGERY. THE 1 VISITOR  MAY VISIT WITH YOU AFTER SURGERY IN YOUR PRIVATE ROOM DURING VISITING HOURS ONLY!  YOU NEED TO HAVE A COVID 19 TEST ON_11/15/2021 ______ @_______ , THIS TEST MUST BE DONE BEFORE SURGERY,  COVID TESTING SITE [address] [place] , IT IS ON THE RIGHT GOING OUT [address] APPROXIMATELY  2 MINUTES PAST [employer] ON THE RIGHT. ONCE YOUR COVID TEST IS COMPLETED,  PLEASE BEGIN THE QUARANTINE INSTRUCTIONS AS OUTLINED IN YOUR HANDOUT.                Brian Burns  12/09/2019   Your procedure is scheduled on: 12/16/2019   Report to [facility] Main  Entrance   Report to admitting at     0750 AM     Call this number if you have problems the morning of surgery [phone]    Remember: Do not eat food , candy gum or mints :After Midnight. You may have clear liquids from midnight until 0650am    CLEAR LIQUID DIET   Foods Allowed                                                                       Coffee and tea, regular and decaf                              Plain Jell-O any favor except red or purple                                            Fruit ices (not with fruit pulp)                                      Iced Popsicles                                     Carbonated beverages, regular and diet                                    Cranberry, grape and apple juices Sports drinks like Gatorade Lightly seasoned clear broth or consume(fat free) Sugar, honey syrup   _____________________________________________________________________    BRUSH YOUR TEETH MORNING OF SURGERY AND RINSE YOUR MOUTH OUT, NO CHEWING GUM CANDY OR MINTS.     Take these medicines the morning of surgery with A SIP OF WATER: amlodipine, Celexa, prilosec, Flomax  DO NOT TAKE ANY DIABETIC MEDICATIONS DAY OF YOUR  SURGERY                               You may not have any metal on  your body including hair pins and              piercings  Do not wear jewelry, make-up, lotions, powders or perfumes, deodorant             Do not wear nail polish on your fingernails.  Do not shave  48 hours prior to surgery.              Men may shave face and neck.   Do not bring valuables to the hospital. [place].  Contacts, dentures or bridgework may not be worn into surgery.  Leave suitcase in the car. After surgery it may be brought to your room.     Patients discharged the day of surgery will not be allowed to drive home. IF YOU ARE HAVING SURGERY AND GOING HOME THE SAME DAY, YOU MUST HAVE AN ADULT TO DRIVE YOU HOME AND BE WITH YOU FOR 24 HOURS. YOU MAY GO HOME BY TAXI OR UBER OR ORTHERWISE, BUT AN ADULT MUST ACCOMPANY YOU HOME AND STAY WITH YOU FOR 24 HOURS.  Name and phone number of your driver:  Special Instructions: N/A              Please read over the following fact sheets you were given: _____________________________________________________________________  [facility] - Preparing for Surgery Before surgery, you can play an important role.  Because skin is not sterile, your skin needs to be as free of germs as possible.  You can reduce the number of germs on your skin by washing with CHG (chlorahexidine gluconate) soap before surgery.  CHG is an antiseptic cleaner which kills germs and bonds with the skin to continue killing germs even after washing. Please DO NOT use if you have an allergy to CHG or antibacterial soaps.  If your skin becomes reddened/irritated stop using the CHG and inform your nurse when you arrive at Short Stay. Do not shave (including legs and underarms) for at least 48 hours prior to the first CHG shower.  You may shave your face/neck. Please follow these instructions carefully:  1.  Shower with CHG Soap the night before surgery and the   morning of Surgery.  2.  If you choose to wash your hair, wash your hair first as usual with your  normal  shampoo.  3.  After you shampoo, rinse your hair and body thoroughly to remove the  shampoo.                           4.  Use CHG as you would any other liquid soap.  You can apply chg directly  to the skin and wash                       Gently with a scrungie or clean washcloth.  5.  Apply the CHG Soap to your body ONLY FROM THE NECK DOWN.   Do not use on face/ open                           Wound or open sores. Avoid contact with eyes, ears mouth and genitals (private parts).                       Wash face,  Genitals (  private parts) with your normal soap.             6.  Wash thoroughly, paying special attention to the area where your surgery  will be performed.  7.  Thoroughly rinse your body with warm water from the neck down.  8.  DO NOT shower/wash with your normal soap after using and rinsing off  the CHG Soap.                9.  Pat yourself dry with a clean towel.            10.  Wear clean pajamas.            11.  Place clean sheets on your bed the night of your first shower and do not  sleep with pets. Day of Surgery : Do not apply any lotions/deodorants the morning of surgery.  Please wear clean clothes to the hospital/surgery center.  FAILURE TO FOLLOW THESE INSTRUCTIONS MAY RESULT IN THE CANCELLATION OF YOUR SURGERY PATIENT SIGNATURE_________________________________  NURSE SIGNATURE__________________________________  ________________________________________________________________________

## 2019-12-09 NOTE — Telephone Encounter (Signed)
Pam, with [facility], calls nurse line stating patient is scheduled for surgery on 11/18. Typically the patient holds Xarelto 72 hours prior.However, need PCPs ok or recommendations. Please advise.

## 2019-12-09 NOTE — Telephone Encounter (Signed)
Review of patient's chart, he has a high risk of clotting with CHAD2VASC score of 6. He is also higher risk of bleeding with HAS-BLED score of 4. Based on my research it would more recommended to hold for 48 hours prior and restarting 48 hours after the procedure. However, given that he is followed by a cardiologist, Dr. Zoila Shutter at [facility], and this medication is managed by them, I recommend [facility] reaching out his cardiologist for confirmation of this and their recommendations.

## 2019-12-10 ENCOUNTER — Other Ambulatory Visit: Payer: Self-pay

## 2019-12-10 ENCOUNTER — Encounter (HOSPITAL_COMMUNITY)
Admission: RE | Admit: 2019-12-10 | Discharge: 2019-12-10 | Disposition: A | Discharge status: Home / Self Care | Payer: Medicare Other | Source: Ambulatory Visit | Attending: Urology | Admitting: Urology

## 2019-12-10 ENCOUNTER — Encounter (HOSPITAL_COMMUNITY): Payer: Self-pay

## 2019-12-10 ENCOUNTER — Telehealth: Payer: Self-pay | Admitting: Internal Medicine

## 2019-12-10 DIAGNOSIS — Z01812 Encounter for preprocedural laboratory examination: Secondary | ICD-10-CM | POA: Insufficient documentation

## 2019-12-10 HISTORY — DX: Anxiety disorder, unspecified: F41.9

## 2019-12-10 HISTORY — DX: Depression, unspecified: F32.A

## 2019-12-10 LAB — CBC
HCT: 33.9 % — ABNORMAL LOW (ref 39.0–52.0)
Hemoglobin: 10.9 g/dL — ABNORMAL LOW (ref 13.0–17.0)
MCH: 29.3 pg (ref 26.0–34.0)
MCHC: 32.2 g/dL (ref 30.0–36.0)
MCV: 91.1 fL (ref 80.0–100.0)
Platelets: 157 10*3/uL (ref 150–400)
RBC: 3.72 MIL/uL — ABNORMAL LOW (ref 4.22–5.81)
RDW: 15.6 % — ABNORMAL HIGH (ref 11.5–15.5)
WBC: 5 10*3/uL (ref 4.0–10.5)
nRBC: 0 % (ref 0.0–0.2)

## 2019-12-10 LAB — BASIC METABOLIC PANEL
Anion gap: 10 (ref 5–15)
BUN: 20 mg/dL (ref 8–23)
CO2: 24 mmol/L (ref 22–32)
Calcium: 8.9 mg/dL (ref 8.9–10.3)
Chloride: 99 mmol/L (ref 98–111)
Creatinine, Ser: 1.56 mg/dL — ABNORMAL HIGH (ref 0.61–1.24)
GFR, Estimated: 47 mL/min — ABNORMAL LOW (ref >60)
Glucose, Bld: 113 mg/dL — ABNORMAL HIGH (ref 70–99)
Potassium: 3.7 mmol/L (ref 3.5–5.1)
Sodium: 133 mmol/L — ABNORMAL LOW (ref 135–145)

## 2019-12-10 NOTE — Telephone Encounter (Signed)
Patient with diagnosis of atrial fibrillation on Xarelto for anticoagulation.    Procedure: urethral dilation Date of procedure: 12/16/19    CHA2DS2-VASc Score = 7  This indicates a 11.2% annual risk of stroke. The patient's score is based upon: CHF History: 1 HTN History: 1 Diabetes History: 1 Stroke History: 2 Vascular Disease History: 1 Age Score: 1 Gender Score: 0   CrCl 55.9 Platelet count 157  Would recommend to hold xarelto for 2 days prior to procedure.  Reviewed with Dr. Rennis Golden due to stroke history.    Patient will not need bridging with Lovenox (enoxaparin) around procedure.

## 2019-12-10 NOTE — Progress Notes (Addendum)
Anesthesia Review:  PCP: [facility]  Cardiologist : DR Zoila Shutter  Clearance has been requested on 12/10/2019 but not yet received.  LOV 7/2/21[name],PA - cardiology  Chest x-ray : 05/25/19- I View  EKG :05/31/2019  Echo :2018  Stress test: 2018  Cardiac Cath :  Activity level: cannot climb a flight of stairs without difficulty  Sleep Study/ CPAP : no  Fasting Blood Sugar :      / Checks Blood Sugar -- times a day:   Blood Thinner/ Instructions /Last Dose: ASA / Instructions/ Last Dose :  Xarelto - Per daughter, [name]- to hold 72 hours prior to procedure  CBC and BMP done 12/10/2019 routed via epic to Dr Berniece Salines .

## 2019-12-10 NOTE — Telephone Encounter (Signed)
   Primary Cardiologist: Chrystie Nose, MD  Chart reviewed as part of pre-operative protocol coverage. Given past medical history and time since last visit, based on ACC/AHA guidelines, Brian Burns would be at acceptable risk for the planned procedure without further cardiovascular testing.   Patient with diagnosis of atrial fibrillation on Xarelto for anticoagulation.    Procedure: urethral dilation Date of procedure: 12/16/19    CHA2DS2-VASc Score = 7  This indicates a 11.2% annual risk of stroke. The patient's score is based upon: CHF History: 1 HTN History: 1 Diabetes History: 1 Stroke History: 2 Vascular Disease History: 1 Age Score: 1 Gender Score: 0   CrCl 55.9 Platelet count 157  Would recommend to hold xarelto for 2 days prior to procedure.  Reviewed with Dr. Rennis Golden due to stroke history.    Patient will not need bridging with Lovenox (enoxaparin) around procedure.  Patient was advised that if he develops new symptoms prior to surgery to contact our office to arrange a follow-up appointment.  He verbalized understanding.  I will route this recommendation to the requesting party via Epic fax function and remove from pre-op pool.  Please call with questions.  Thomasene Ripple. Inioluwa Baris NP-C    12/10/2019, 4:24 PM [facility] [address] Office [phone] Fax [phone]

## 2019-12-10 NOTE — Telephone Encounter (Signed)
Patient contacted as part of preoperative clearance evaluation.  He denies cardiac complaints at this time.  He specifically denies chest pain, shortness of breath, palpitations, and activity intolerance.

## 2019-12-10 NOTE — Telephone Encounter (Signed)
   [facility] Pre-operative Risk Assessment    HEARTCARE STAFF: - Please ensure there is not already an duplicate clearance open for this procedure. - Under Visit Info/Reason for Call, type in Other and utilize the format Clearance MM/DD/YY or Clearance TBD. Do not use dashes or single digits. - If request is for dental extraction, please clarify the # of teeth to be extracted.  Request for surgical clearance:  1. What type of surgery is being performed? Urethral Dilation  2. When is this surgery scheduled? 12/16/19   3. What type of clearance is required (medical clearance vs. Pharmacy clearance to hold med vs. Both)? both  4. Are there any medications that need to be held prior to surgery and how long? Xarelto - however long we recommend.  5. Practice name and name of physician performing surgery? [facility] Dr. Louis Meckel  6. What is the office phone number? [place]   7.   What is the office fax number? [phone]  8.   Anesthesia type (None, local, MAC, general) ? [facility] states that we handle the patients Xarelto. Pam reached out to them prior to [place] because it shows that they handle the Xarelto. They cleared the patient to hold it 48 hours prior but advised her to reach out to [place].    Leah Newnam 12/10/2019, 10:18 AM  _________________________________________________________________   (provider comments below)

## 2019-12-13 ENCOUNTER — Other Ambulatory Visit (HOSPITAL_COMMUNITY)
Admission: RE | Admit: 2019-12-13 | Discharge: 2019-12-13 | Disposition: A | Discharge status: Home / Self Care | Payer: Medicare Other | Source: Ambulatory Visit | Attending: Urology | Admitting: Urology

## 2019-12-13 DIAGNOSIS — Z01812 Encounter for preprocedural laboratory examination: Secondary | ICD-10-CM | POA: Insufficient documentation

## 2019-12-13 DIAGNOSIS — Z20822 Contact with and (suspected) exposure to covid-19: Secondary | ICD-10-CM | POA: Diagnosis not present

## 2019-12-13 LAB — SARS CORONAVIRUS 2 (TAT 6-24 HRS): SARS Coronavirus 2: NEGATIVE

## 2019-12-13 NOTE — Anesthesia Preprocedure Evaluation (Addendum)
Anesthesia Evaluation  Patient identified by MRN, date of birth, ID band Patient awake    Reviewed: Allergy & Precautions, NPO status , Patient's Chart, lab work & pertinent test results  Airway Mallampati: II  TM Distance: >3 FB Neck ROM: Full    Dental no notable dental hx.    Pulmonary neg pulmonary ROS, former smoker,    Pulmonary exam normal breath sounds clear to auscultation       Cardiovascular hypertension, + Peripheral Vascular Disease  Normal cardiovascular exam Rhythm:Regular Rate:Normal     Neuro/Psych CVA negative psych ROS   GI/Hepatic negative GI ROS, Neg liver ROS,   Endo/Other  diabetes  Renal/GU Renal InsufficiencyRenal disease  negative genitourinary   Musculoskeletal negative musculoskeletal ROS (+)   Abdominal   Peds negative pediatric ROS (+)  Hematology negative hematology ROS (+)   Anesthesia Other Findings   Reproductive/Obstetrics negative OB ROS                            Anesthesia Physical Anesthesia Plan  ASA: III  Anesthesia Plan: General   Post-op Pain Management:    Induction: Intravenous  PONV Risk Score and Plan: 2 and Ondansetron, Dexamethasone and Treatment may vary due to age or medical condition  Airway Management Planned: LMA  Additional Equipment:   Intra-op Plan:   Post-operative Plan: Extubation in OR  Informed Consent: I have reviewed the patients History and Physical, chart, labs and discussed the procedure including the risks, benefits and alternatives for the proposed anesthesia with the patient or authorized representative who has indicated his/her understanding and acceptance.     Dental advisory given  Plan Discussed with: CRNA and Surgeon  Anesthesia Plan Comments: (See PAT note 12/10/19, Jodell Cipro, PA-C)       Anesthesia Quick Evaluation

## 2019-12-13 NOTE — Progress Notes (Signed)
Anesthesia Chart Review   Case: [id number] Date/Time: 12/16/19 0940   Procedure: CYSTOSCOPY WITH RETROGRADE URETHROGRAM BALLOON DILATION (N/A )   Anesthesia type: General   Pre-op diagnosis: URETHRAL STRICTURE   Location: [facility]OM 07 / [facility]   Surgeons: Crist Fat, MD      DISCUSSION:74 y.o. former smoker (20 pack years, quit 1/1/1) with h/o HTN, GERD, carotid artery disease, a-fib (on Xarelto), CAD, CKD Stage III, urethral stricture scheduled for above procedure 12/16/2019 with Dr. Berniece Salines.   Per cardiology preoperative risk assessment 12/10/2019, "Chart reviewed as part of pre-operative protocol coverage. Given past medical history and time since last visit, based on ACC/AHA guidelines, Brian Burns would be at acceptable risk for the planned procedure without further cardiovascular testing." Pt advised to hold Xarelto 2 days prior to procedure.   Anticipate pt can proceed with planned procedure barring acute status change.  VS: BP (!) 133/99   Pulse 68   Temp 36.7 C (Oral)   Resp 18   Ht 6\' 4"  (1.93 m)   SpO2 100%   BMI 24.78 kg/m   PROVIDERS: , DO is PCP   Joana Reamer, MD is Cardiologist  LABS: Labs reviewed: Acceptable for surgery. (all labs ordered are listed, but only abnormal results are displayed)  Labs Reviewed  BASIC METABOLIC PANEL - Abnormal; Notable for the following components:      Result Value   Sodium 133 (*)    Glucose, Bld 113 (*)    Creatinine, Ser 1.56 (*)    GFR, Estimated 47 (*)    All other components within normal limits  CBC - Abnormal; Notable for the following components:   RBC 3.72 (*)    Hemoglobin 10.9 (*)    HCT 33.9 (*)    RDW 15.6 (*)    All other components within normal limits     IMAGES:   EKG: 05/31/19 Rate 131 sinus tachycardia   CV: Echo 10/01/2016 Study Conclusions   - Left ventricle: The cavity size was mildly dilated. Wall  thickness was increased in a pattern of mild LVH.  Systolic  function was normal. The estimated ejection fraction was in the  range of 50% to 55%. There is hypokinesis of the inferolateral  myocardium. Doppler parameters are consistent with abnormal left  ventricular relaxation (grade 1 diastolic dysfunction). Doppler  parameters are consistent with high ventricular filling pressure.  - Aortic valve: There was trivial regurgitation.  - Aortic root: The aortic root was mildly dilated.  - Ascending aorta: The ascending aorta was mildly dilated.  - Mitral valve: Calcified annulus.  - Right ventricle: The cavity size was mildly dilated.   Impressions:   - Hypokinesis of the inferolateral wall with overall low normal LV  systolic function; mild diastolic dysfunction; sclerotic aortic  valve with trace AI; mildly dilated aortic root; mild RVE; mild  TR.   Myocardial Perfusion 09/12/2016  The left ventricular ejection fraction is mildly decreased (45-54%).  Nuclear stress EF: 46%.  There was no ST segment deviation noted during stress.  This is a low risk study.   Low risk stress nuclear study with normal perfusion and mildly reduced left ventricular global systolic function. Findings are most compatible with nonischemic cardiomyopathy.  Past Medical History:  Diagnosis Date  . AAA (abdominal aortic aneurysm) without rupture (HCC) 06/2014   no change in last exam from 2015  . Achalasia   . Anxiety   . Arthritis    knees,lower back  .  Atrial fibrillation (HCC)   . Atrial flutter with rapid ventricular response (HCC) 01/16/2015  . Bilateral wrist pain 09/09/2014  . Bladder outlet obstruction 01/20/2015  . Bradycardia 08/27/2016  . CAD (coronary artery disease)    pt denies  . Carotid artery disease (HCC)   . Chronic back pain   . Chronic kidney disease (CKD), stage III (moderate) (HCC)   . Decreased visual acuity of left eye  12/02/2018  . Depression   . Dyspnea    reports today no pregoression  . Erectile  dysfunction   . GERD (gastroesophageal reflux disease)   . Headache    hx of  . History of CVA (cerebrovascular accident)    2009  &  2013  . History of Multiple lacunar infarcts (HCC) 01/31/2015   MRI Brain 01/31/2015: Widespread old lacunar infarcts, particularly right Basal ganglia, pons, and right cerebellum  . HLD (hyperlipidemia)   . Hypertension   . Idiopathic pancreatitis 12/2014   acute  . Knee pain, bilateral 04/09/2011  . Malnutrition of moderate degree 01/25/2015  . Memory impairment    mild dementia per PCP progress note   . Morbid obesity (HCC) 12/03/2018  . Other abnormalities of gait and mobility 12/03/2018  . PVD (peripheral vascular disease) with claudication (HCC)   . Urethral stricture 02/16/2013  . Urge incontinence 12/08/2018    Past Surgical History:  Procedure Laterality Date  . back injections     [facility]  . BALLOON DILATION N/A 12/16/2018   Procedure: URETHRAL BALLOON DILATION;  Surgeon: Crist Fat, MD;  Location: [facility];  Service: Urology;  Laterality: N/A;  . CARDIOVASCULAR STRESS TEST  08-09-2009   mild global hypokinesis,  no ischemia or scar/  ef 41%  . CATARACT EXTRACTION W/ INTRAOCULAR LENS  IMPLANT, BILATERAL  april and may 2014  . CYSTOSCOPY N/A 01/17/2017   Procedure: CYSTOSCOPY FLEXIBLE;  Surgeon: Crist Fat, MD;  Location: [facility];  Service: Urology;  Laterality: N/A;  . CYSTOSCOPY WITH RETROGRADE URETHROGRAM N/A 11/24/2014   Procedure: CYSTOSCOPY WITH RETROGRADE URETHROGRAM;  Surgeon: Crist Fat, MD;  Location: [facility];  Service: Urology;  Laterality: N/A;  . CYSTOSCOPY WITH RETROGRADE URETHROGRAM N/A 12/16/2018   Procedure: CYSTOSCOPY WITH RETROGRADE URETHROGRAM;  Surgeon: Crist Fat, MD;  Location: [facility];  Service: Urology;  Laterality: N/A;  . CYSTOSCOPY WITH URETHRAL DILATATION N/A 11/24/2014   Procedure: CYSTOSCOPY WITH URETHRAL DILATATION;  Surgeon: Crist Fat, MD;   Location: [facility];  Service: Urology;  Laterality: N/A;  BALLOON DILATION        . ESOPHAGOGASTRODUODENOSCOPY N/A 05/15/2014   Procedure: ESOPHAGOGASTRODUODENOSCOPY (EGD);  Surgeon: Carman Ching, MD;  Location: [facility] ENDOSCOPY;  Service: Endoscopy;  Laterality: N/A;  . ESOPHAGOGASTRODUODENOSCOPY Left 02/02/2015   Procedure: ESOPHAGOGASTRODUODENOSCOPY (EGD);  Surgeon: Dorena Cookey, MD;  Location: [facility] ENDOSCOPY;  Service: Endoscopy;  Laterality: Left;  . ESOPHAGOGASTRODUODENOSCOPY Left 03/16/2015   Procedure: ESOPHAGOGASTRODUODENOSCOPY (EGD);  Surgeon: Dorena Cookey, MD;  Location: [facility];  Service: Endoscopy;  Laterality: Left;  . TRANSTHORACIC ECHOCARDIOGRAM  01-07-2012   mild to moderate dilated LV,  ef 45-50%,  mild basal hypokinesis,  grade I diastolic  dysfunction/  trivial AR/  mild MR  . URETHROPLASTY N/A 01/17/2017   Procedure: URETHEROPLASTY BUCCAL GRAFT AND BUCCAL MUCOSA GRAFT HARVEST;  Surgeon: Crist Fat, MD;  Location[facility];  Service: Urology;  Laterality: N/A;    MEDICATIONS: . acetaminophen (TYLENOL) 325 MG tablet  . atorvastatin (LIPITOR) 40  MG tablet  . cephALEXin (KEFLEX) 250 MG capsule  . citalopram (CELEXA) 40 MG tablet  . DULoxetine (CYMBALTA) 60 MG capsule  . ergocalciferol (VITAMIN D2) 1.25 MG (50000 UT) capsule  . ferrous sulfate 324 MG TBEC  . finasteride (PROSCAR) 5 MG tablet  . lipase/protease/amylase (CREON) 12000-38000 units CPEP capsule  . lisinopril (ZESTRIL) 5 MG tablet  . omeprazole (PRILOSEC) 20 MG capsule  . Rivaroxaban (XARELTO) 15 MG TABS tablet  . sulfamethoxazole-trimethoprim (BACTRIM DS) 800-160 MG tablet  . tamsulosin (FLOMAX) 0.4 MG CAPS capsule  . traMADol (ULTRAM) 50 MG tablet   No current facility-administered medications for this encounter.    Jodell Cipro, PA-C [facility] [phone]

## 2019-12-14 NOTE — Telephone Encounter (Signed)
Contacted Pam at [employer] and informed her of PCP recommendations.

## 2019-12-15 MED ORDER — GENTAMICIN SULFATE 40 MG/ML IJ SOLN
460.0000 mg | INTRAVENOUS | Status: AC
  Administered 2019-12-16: 460 mg via INTRAVENOUS
  Filled 2019-12-15: qty 11.5

## 2019-12-16 ENCOUNTER — Other Ambulatory Visit: Payer: Self-pay

## 2019-12-16 ENCOUNTER — Ambulatory Visit (HOSPITAL_COMMUNITY): Payer: Medicare Other | Admitting: Certified Registered Nurse Anesthetist

## 2019-12-16 ENCOUNTER — Ambulatory Visit (HOSPITAL_COMMUNITY): Payer: Medicare Other | Admitting: Physician Assistant

## 2019-12-16 ENCOUNTER — Ambulatory Visit (HOSPITAL_COMMUNITY)
Admission: RE | Admit: 2019-12-16 | Discharge: 2019-12-16 | Disposition: A | Discharge status: Home / Self Care | Payer: Medicare Other | Source: Other Acute Inpatient Hospital | Attending: Urology | Admitting: Urology

## 2019-12-16 ENCOUNTER — Encounter (HOSPITAL_COMMUNITY)
Admission: RE | Disposition: A | Discharge status: Home / Self Care | Payer: Self-pay | Source: Other Acute Inpatient Hospital | Attending: Urology

## 2019-12-16 ENCOUNTER — Ambulatory Visit (HOSPITAL_COMMUNITY): Payer: Medicare Other

## 2019-12-16 DIAGNOSIS — Z888 Allergy status to other drugs, medicaments and biological substances status: Secondary | ICD-10-CM | POA: Insufficient documentation

## 2019-12-16 DIAGNOSIS — Z79899 Other long term (current) drug therapy: Secondary | ICD-10-CM | POA: Insufficient documentation

## 2019-12-16 DIAGNOSIS — Z87891 Personal history of nicotine dependence: Secondary | ICD-10-CM | POA: Diagnosis not present

## 2019-12-16 DIAGNOSIS — N183 Chronic kidney disease, stage 3 unspecified: Secondary | ICD-10-CM | POA: Diagnosis not present

## 2019-12-16 DIAGNOSIS — N35912 Unspecified bulbous urethral stricture, male: Secondary | ICD-10-CM | POA: Insufficient documentation

## 2019-12-16 DIAGNOSIS — N35011 Post-traumatic bulbous urethral stricture: Secondary | ICD-10-CM | POA: Diagnosis not present

## 2019-12-16 DIAGNOSIS — I129 Hypertensive chronic kidney disease with stage 1 through stage 4 chronic kidney disease, or unspecified chronic kidney disease: Secondary | ICD-10-CM | POA: Diagnosis not present

## 2019-12-16 DIAGNOSIS — N99114 Postprocedural urethral stricture, male, unspecified: Secondary | ICD-10-CM

## 2019-12-16 DIAGNOSIS — N35914 Unspecified anterior urethral stricture, male: Secondary | ICD-10-CM | POA: Diagnosis not present

## 2019-12-16 DIAGNOSIS — E1122 Type 2 diabetes mellitus with diabetic chronic kidney disease: Secondary | ICD-10-CM | POA: Diagnosis not present

## 2019-12-16 HISTORY — PX: CYSTOSCOPY WITH RETROGRADE URETHROGRAM: SHX6309

## 2019-12-16 SURGERY — CYSTOSCOPY WITH RETROGRADE URETHROGRAM
Anesthesia: General

## 2019-12-16 MED ORDER — FENTANYL CITRATE (PF) 100 MCG/2ML IJ SOLN: 25.0000 ug | INTRAMUSCULAR | Status: DC | PRN

## 2019-12-16 MED ORDER — LIDOCAINE 2% (20 MG/ML) 5 ML SYRINGE
INTRAMUSCULAR | Status: DC | PRN
  Administered 2019-12-16: 100 mg via INTRAVENOUS

## 2019-12-16 MED ORDER — LACTATED RINGERS IV SOLN: INTRAVENOUS | Status: DC

## 2019-12-16 MED ORDER — ORAL CARE MOUTH RINSE: 15.0000 mL | Freq: Once | OROMUCOSAL | Status: AC

## 2019-12-16 MED ORDER — STERILE WATER FOR IRRIGATION IR SOLN
Status: DC | PRN
  Administered 2019-12-16: 2000 mL

## 2019-12-16 MED ORDER — PROPOFOL 10 MG/ML IV BOLUS
INTRAVENOUS | Status: AC
  Filled 2019-12-16: qty 20

## 2019-12-16 MED ORDER — DEXAMETHASONE SODIUM PHOSPHATE 4 MG/ML IJ SOLN
INTRAMUSCULAR | Status: DC | PRN
  Administered 2019-12-16: 5 mg via INTRAVENOUS

## 2019-12-16 MED ORDER — PROPOFOL 10 MG/ML IV BOLUS
INTRAVENOUS | Status: DC | PRN
  Administered 2019-12-16: 150 mg via INTRAVENOUS

## 2019-12-16 MED ORDER — LIDOCAINE 2% (20 MG/ML) 5 ML SYRINGE
INTRAMUSCULAR | Status: AC
  Filled 2019-12-16: qty 5

## 2019-12-16 MED ORDER — DEXAMETHASONE SODIUM PHOSPHATE 10 MG/ML IJ SOLN
INTRAMUSCULAR | Status: AC
  Filled 2019-12-16: qty 1

## 2019-12-16 MED ORDER — IOHEXOL 300 MG/ML  SOLN
INTRAMUSCULAR | Status: DC | PRN
  Administered 2019-12-16: 50 mL

## 2019-12-16 MED ORDER — FENTANYL CITRATE (PF) 100 MCG/2ML IJ SOLN
INTRAMUSCULAR | Status: AC
  Filled 2019-12-16: qty 2

## 2019-12-16 MED ORDER — 0.9 % SODIUM CHLORIDE (POUR BTL) OPTIME
TOPICAL | Status: DC | PRN
  Administered 2019-12-16: 1000 mL

## 2019-12-16 MED ORDER — ONDANSETRON HCL 4 MG/2ML IJ SOLN
INTRAMUSCULAR | Status: DC | PRN
  Administered 2019-12-16: 4 mg via INTRAVENOUS

## 2019-12-16 MED ORDER — TRAMADOL HCL 50 MG PO TABS: 50.0000 mg | ORAL_TABLET | Freq: Four times a day (QID) | ORAL | 0 refills | Status: DC | PRN

## 2019-12-16 MED ORDER — ONDANSETRON HCL 4 MG/2ML IJ SOLN: 4.0000 mg | Freq: Once | INTRAMUSCULAR | Status: DC | PRN

## 2019-12-16 MED ORDER — CHLORHEXIDINE GLUCONATE 0.12 % MT SOLN
15.0000 mL | Freq: Once | OROMUCOSAL | Status: AC
  Administered 2019-12-16: 15 mL via OROMUCOSAL

## 2019-12-16 MED ORDER — FENTANYL CITRATE (PF) 100 MCG/2ML IJ SOLN
INTRAMUSCULAR | Status: DC | PRN
  Administered 2019-12-16: 50 ug via INTRAVENOUS
  Administered 2019-12-16 (×2): 25 ug via INTRAVENOUS

## 2019-12-16 MED ORDER — ONDANSETRON HCL 4 MG/2ML IJ SOLN
INTRAMUSCULAR | Status: AC
  Filled 2019-12-16: qty 2

## 2019-12-16 SURGICAL SUPPLY — 15 items
BAG URINE DRAIN 2000ML AR STRL (UROLOGICAL SUPPLIES) ×3 IMPLANT
BAG URO CATCHER STRL LF (MISCELLANEOUS) ×3 IMPLANT
CATH FOLEY 2W COUNCIL 5CC 18FR (CATHETERS) ×3 IMPLANT
CATH URET 5FR 28IN OPEN ENDED (CATHETERS) ×3 IMPLANT
CLOTH BEACON ORANGE TIMEOUT ST (SAFETY) ×3 IMPLANT
GLOVE BIOGEL M STRL SZ7.5 (GLOVE) ×3 IMPLANT
GOWN STRL REUS W/TWL XL LVL3 (GOWN DISPOSABLE) ×3 IMPLANT
GUIDEWIRE STR DUAL SENSOR (WIRE) ×3 IMPLANT
KIT TURNOVER KIT A (KITS) IMPLANT
MANIFOLD NEPTUNE II (INSTRUMENTS) ×3 IMPLANT
PACK CYSTO (CUSTOM PROCEDURE TRAY) ×3 IMPLANT
SYR TOOMEY IRRIG 70ML (MISCELLANEOUS) ×3
SYRINGE TOOMEY IRRIG 70ML (MISCELLANEOUS) ×1 IMPLANT
TUBING CONNECTING 10 (TUBING) ×2 IMPLANT
TUBING CONNECTING 10' (TUBING) ×1

## 2019-12-16 NOTE — Op Note (Signed)
Preoperative diagnosis:  1. Recurrent urethral stricture, anterior urethra and bulbous stricture  Postoperative diagnosis:  1. Same  Procedure: 1. Retrograde urethrogram with interpretation 2. Cystourethroscopy 3. Direct vision internal urethrotomy  Surgeon: Crist Fat, MD  Anesthesia: General  Complications: None  Intraoperative findings:  #1: The patient's retrograde urethrogram was performed through an 18 French Foley catheter with the balloon inflated slightly into the fossa navicularis. Fluoroscopy demonstrated a narrowing 4 cm into the urethra with some connection along the outside wall of the urethra to a section several centimeters more proximal consistent with a fistulous-like connection. There was also a stricture in the bulbar region that was several centimeters long. #2: The fistulous area was opened with a cold knife along the length of it in the diameter of the lumen significantly improved at this area. In the bulbar region there was a thick stricture that was probably 2 cm long. This was opened at the 6 o'clock position. The incision went from the bulbous urethra into the anterior urethra. The remaining aspect of the urethra, posterior urethra/membranous urethra and the distal anterior urethra was normal.  EBL: Minimal  Specimens: None  Indication: Brian Burns is a 74 y.o. patient with history of postinfectious urethral stricture that had a buccal graft urethroplasty performed several years prior. Since that time he is had failure and recurrent strictures. Recently the patient has had a difficult time urinating and been treated for several recurring urinary tract infections.  After reviewing the management options for treatment, he elected to proceed with the above surgical procedure(s). We have discussed the potential benefits and risks of the procedure, side effects of the proposed treatment, the likelihood of the patient achieving the goals of the procedure,  and any potential problems that might occur during the procedure or recuperation. Informed consent has been obtained.  Description of procedure:  The patient was taken to the operating room and general anesthesia was induced.  The patient was placed in the dorsal lithotomy position, prepped and draped in the usual sterile fashion, and preoperative antibiotics were administered. A preoperative time-out was performed.   An 3 French Foley catheter was placed into the patient's fossa navicularis and 3 cc of sterile water inflated to the balloon. I then injected approximately 45 cc of Omnipaque through the catheter and performed a retrograde urethrogram with the above findings. I then deflated the balloon and inserted a 0 degree cystoscope and used the Collins knife to incise the fistulous area on the right wall of the urethra. I then advanced the scope to the more proximal bulbous stricture and incised the stricture at the 6 o'clock position and incised the urethra all the way into the anterior urethra. Once this was all opened widely and there was noted bleeding I removed the 0 degree scope and exchanged for the 30 degree lens. I then advanced a 30 degree 21 French sheath cystoscope into the patient's bladder without any resistance. Once into the bladder the patient was noted to have profound trabeculations. The urine was turbid and sediment is. I irrigated out the patient's bladder and then advanced a wire through the scope removing the scope over the wire. I then passed an 74 [place] [employer] tip catheter over the wire and into the bladder under fluoroscopic guidance. I subsequently remove the wire and inflated the Foley catheter with 10 cc of sterile water.  The case was then terminated the patient awoken returned to the PACU in stable condition.  Disposition: The patient will be  scheduled for catheter removal in 2 weeks. We will continue the antibiotics that have been prescribed for the next 5  days.  Crist Fat, M.D.

## 2019-12-16 NOTE — Discharge Instructions (Signed)

## 2019-12-16 NOTE — Interval H&P Note (Signed)
History and Physical Interval Note:  12/16/2019 9:09 AM  Brian Burns  has presented today for surgery, with the diagnosis of URETHRAL STRICTURE.  The various methods of treatment have been discussed with the patient and family. After consideration of risks, benefits and other options for treatment, the patient has consented to  Procedure(s): CYSTOSCOPY WITH RETROGRADE URETHROGRAM BALLOON DILATION (N/A) as a surgical intervention.  The patient's history has been reviewed, patient examined, no change in status, stable for surgery.  I have reviewed the patient's chart and labs.  Questions were answered to the patient's satisfaction.     Crist Fat

## 2019-12-16 NOTE — Anesthesia Procedure Notes (Signed)
Procedure Name: LMA Insertion Date/Time: 12/16/2019 10:03 AM Performed by: Vanessa Dayton, CRNA Pre-anesthesia Checklist: Emergency Drugs available, Patient identified, Suction available and Patient being monitored Patient Re-evaluated:Patient Re-evaluated prior to induction Oxygen Delivery Method: Circle system utilized Preoxygenation: Pre-oxygenation with 100% oxygen Induction Type: IV induction Ventilation: Mask ventilation without difficulty LMA: LMA inserted LMA Size: 5.0 Number of attempts: 1 Placement Confirmation: positive ETCO2,  breath sounds checked- equal and bilateral and CO2 detector Tube secured with: Tape Dental Injury: Teeth and Oropharynx as per pre-operative assessment

## 2019-12-16 NOTE — Anesthesia Postprocedure Evaluation (Signed)
Anesthesia Post Note  Patient: Brian Burns  Procedure(s) Performed: CYSTOSCOPY WITH RETROGRADE, Direct Vision Internal Urethrotomy (N/A )     Patient location during evaluation: PACU Anesthesia Type: General Level of consciousness: awake and alert Pain management: pain level controlled Vital Signs Assessment: post-procedure vital signs reviewed and stable Respiratory status: spontaneous breathing, nonlabored ventilation, respiratory function stable and patient connected to nasal cannula oxygen Cardiovascular status: blood pressure returned to baseline and stable Postop Assessment: no apparent nausea or vomiting Anesthetic complications: no   No complications documented.  Last Vitals:  Vitals:   12/16/19 1145 12/16/19 1155  BP: (!) 178/105 (!) 178/103  Pulse: (!) 45 (!) 44  Resp: 12 12  Temp:    SpO2: 100% 100%    Last Pain:  Vitals:   12/16/19 1145  TempSrc:   PainSc: 0-No pain                 Deveion Denz S

## 2019-12-16 NOTE — H&P (Signed)
Patient presents today for follow-up. He has a history of a urethral stricture. In 2018 he underwent a buccal graft urethroplasty. In 2020 the patient had a balloon dilation of that area. We discussed with the patient passing a catheter once weekly, the patient declined to do this.   5/21: He was recently admitted to the hospital where he was noted to be septic. He had gram-negative rods including Clostridium in his blood cultures that were positive. His urine culture was negative. He feels like he is emptying his bladder. He is not having any leakage. The patient does complain of some mild dysuria.   08/2019: Today the patient follows up for a 3 month evaluation. At his last office visit he was noted to be voiding relatively well and emptying his bladder. He did have an infection in his urine that we treated and put him on prophylaxi, Keflex 250 mg daily. The patient has done very well on the suppressive antibiotic. He feels like he is emptying his bladder well and has not really had any significant urine tract symptoms. He does endorse dysuria intermittently. It happens once or twice a day and 3 times a week.   12/02/19: Patient with above-noted history. He presents today with complaints of dysuria. He states that this is been ongoing for approximately 1 week. He was treated recently for E coli UTI by Nephrology. This culture was fairly resistant and noted to be sensitive to nitrofurantoin. He continues to feel he voids with an adequate stream and feels that he empties efficiently. He has urinary frequency of about every 1 and half to 2 hours. He will have urgency on occasion, as well. No complaints of gross hematuria. He denies any unilateral flank pain. No reports of fevers or chills. He remains on prophylactic cephalexin.   12-08-19: Patient with above noted hx. He returns today for follow up. PVR at prior OV was noted to be in the 200s range. He was treated empirically for complaints of dysuria based on  prior positive culture from nephrology. He was unable to void at that time. In and out catheterization was attempted, but unsuccessful. Today he continues to complain of dysuria. He states that symptoms have not improved on antibiotic therapy. He continues to feel he is voiding with a fair stream. He feels he is emptying efficiently. He denies hematuria. No complaints of fevers or chills. He denies any flank or abdominal pain. He is on Xarelto for hx of A. Fib. No past issues with general anesthesia.     ALLERGIES: Xylocaine SOLN     Notes: per pt it didn't work for anesthesia during dental procedure    MEDICATIONS: Cephalexin 250 mg capsule 1 capsule PO Q HS  Lisinopril 10 mg tablet  Macrobid 100 mg capsule 1 capsule PO BID  Aspirin Low Dose 81 MG TABS Oral  Atorvastatin Calcium 40 MG Oral Tablet Oral  Citalopram Hbr 20 mg tablet Oral  Finasteride 5 mg tablet 0 Oral  Gabapentin 100 MG Oral Capsule Oral  Meclizine Hcl 25 mg tablet  Peridex 0.12 % mouthwash 15 mL swish and spit twice daily  Tamsulosin HCl - 0.4 MG Oral Capsule 0 Oral Daily  TraMADol HCl - 50 MG Oral Tablet Oral  Vitamin D  Xarelto     GU PSH: Anastomotic Urethroplasty - 2018 Cysto Dilate Stricture (M or F) - 12/16/2018, 2016 Cystoscopy - 12/01/2018, 2018 Injection For Bladder X-ray - 2019 Skin Full Graft - 2018       PSH  Notes: Cystoscopy For Urethral Stricture, No Surgical Problems   NON-GU PSH: None   GU PMH: Bulbar urethral stricture - 12/02/2019, - 11/16/2018 Chronic cystitis (w/o hematuria) - 12/02/2019, - 09/14/2019 Dysuria - 12/02/2019 Urethral Stricture, Unspec - 12/01/2018, The patient has a history of a soft distal or anterior urethral stricture which we dilated once in the patient subsequently recurred quite quickly. This point, the patient is interested in having this repaired via a urethroplasty given the severity of his symptoms., 2016-05-17, Urethral stricture, - 05/18/15 Gross hematuria - 11/06/2018 Anterior  urethral stricture - 2016/05/17 Postinfective bulbous urethral stricture, not elsewhere classified, male - 05/18/2015 Chronic Kidney Disease, Chronic renal failure, unspecified stage - 2015-05-18 Urinary Frequency, Increased urinary frequency - 2015/05/18 Microscopic hematuria, Asymptomatic microscopic hematuria - 05-18-2014 Nocturia, Nocturia - 05-17-13 Other microscopic hematuria, Microscopic hematuria - May 17, 2013      PMH Notes:  2012-08-14 14:58:45 - Note: Arthritis  AAA  CAD  Carotid artery disease  idiopathic pancreatitis acute May 18, 2014  DM II no meds  LVEF 50-55% per cards eval 11/06/16   NON-GU PMH: Encounter for general adult medical examination without abnormal findings, Encounter for preventive health examination - 2015-05-18 Cerebral infarction, unspecified, Stroke Syndrome - 05-17-2012 Decreased libido, Decreased libido - 2012/05/17 Personal history of other diseases of the circulatory system, History of hypertension - 17-May-2012 Personal history of other diseases of the digestive system, History of esophageal reflux - May 17, 2012 Personal history of other diseases of the nervous system and sense organs, History of glaucoma - 05/17/2012 Personal history of other endocrine, nutritional and metabolic disease, History of hypercholesterolemia - 2012-05-17    FAMILY HISTORY: Death In The Family Father - Runs In Family Death In The Family Mother - Runs In Family Heart Disease - Mother   SOCIAL HISTORY: Marital Status: Married Preferred Language: English; Ethnicity: Not Hispanic Or Latino; Race: Black or African American Current Smoking Status: Patient does not smoke anymore. Has not smoked since 12/31/2009. Smoked for 40 years. Smoked 1/2 pack per day.   Tobacco Use Assessment Completed: Used Tobacco in last 30 days? Does not use smokeless tobacco. Does not drink anymore.  Does not use drugs. Drinks 1 caffeinated drink per day.     Notes: Former smoker, History of tobacco use, Alcohol Use, Caffeine Use, Occupation: Retired, Marital History - Currently  Married   REVIEW OF SYSTEMS:    GU Review Male:   Patient reports trouble starting your stream, burning/ pain with urination, and get up at night to urinate. Patient denies erection problems, have to strain to urinate , frequent urination, leakage of urine, penile pain, stream starts and stops, and hard to postpone urination.  Gastrointestinal (Upper):   Patient reports nausea and vomiting. Patient denies indigestion/ heartburn.  Gastrointestinal (Lower):   Patient reports diarrhea. Patient denies constipation.  Constitutional:   Patient denies fever, night sweats, weight loss, and fatigue.  Skin:   Patient denies skin rash/ lesion and itching.  Eyes:   Patient denies blurred vision and double vision.  Ears/ Nose/ Throat:   Patient denies sore throat and sinus problems.  Hematologic/Lymphatic:   Patient denies swollen glands and easy bruising.  Cardiovascular:   Patient denies leg swelling and chest pains.  Respiratory:   Patient denies cough and shortness of breath.  Endocrine:   Patient denies excessive thirst.  Musculoskeletal:   Patient reports back pain. Patient denies joint pain.  Neurological:   Patient reports headaches. Patient denies dizziness.  Psychologic:   Patient denies depression and anxiety.  VITAL SIGNS:      12/08/2019 03:32 PM  Weight 203 lb / 92.08 kg  Height 76 in / 193.04 cm  BP 156/103 mmHg  Heart Rate 73 /min  Temperature 98.2 F / 36.7 C  BMI 24.7 kg/m   MULTI-SYSTEM PHYSICAL EXAMINATION:    Constitutional: Well-nourished. No physical deformities. Normally developed. Good grooming.  Respiratory: Normal breath sounds. No labored breathing, no use of accessory muscles.   Cardiovascular: Regular rate and rhythm. No murmur, no gallop. Normal temperature, normal extremity pulses, no swelling, no varicosities.   Neurologic / Psychiatric: Oriented to time, oriented to place, oriented to person. No depression, no anxiety, no agitation.  Gastrointestinal: No mass, no  tenderness, no rigidity, non obese abdomen. No palpable distention.   Musculoskeletal: Normal gait and station of head and neck.     Complexity of Data:  Records Review:   Previous Doctor Records, Previous Patient Records  Urine Test Review:   Urinalysis, Urine Culture  Urodynamics Review:   Review Bladder Scan   05/08/16 06/21/15  PSA  Total PSA 0.96 ng/dl 4.48     18/56/31  Hormones  Testosterone, Total 467     PROCEDURES:         PVR Ultrasound - 49702  Scanned Volume: 147 cc         Urinalysis w/Scope - 81001 Dipstick Dipstick Cont'd Micro  Color: Brown Bilirubin: Neg WBC/hpf: Packed/hpf  Appearance: Turbid Ketones: Neg RBC/hpf: NS (Not Seen)  Specific Gravity: 1.020 Blood: 3+ Bacteria: Mod (26-50/hpf)  pH: 6.0 Protein: 2+ Cystals: NS (Not Seen)  Glucose: Neg Urobilinogen: 1.0 Casts: NS (Not Seen)    Nitrites: Neg Trichomonas: Not Present    Leukocyte Esterase: 3+ Mucous: Not Present      Epithelial Cells: 0 - 5/hpf      Yeast: NS (Not Seen)      Sperm: Not Present    Notes:  No spin micro due to turbidity. Field obscured by [employer].     ASSESSMENT:      ICD-10 Details  1 GU:   Chronic cystitis (w/o hematuria) - N30.20   2   Bulbar urethral stricture - N35.011   3   Dysuria - R30.0    PLAN:           Orders Labs Urine Culture          Document Letter(s):  Created for Patient: Clinical Summary         Notes:   Urinalysis today shows pyuria and bacteriuria. I will repeat urine culture. I will keep him updated regarding results and alteration in antibiotic therapy, if needed. PVR today is noted to be improved and he continues to void with a fair stream. Case reviewed with his urologist after prior OV. He will likely need repeat balloon dilation of his urethral stricture in the upcoming future and we reviewed this today in detail. We discussed potential risks. He voiced understanding and agreement. Will plan to work on getting this scheduled. Strict return  precuations reviewed in the interim for fevers or worsening voiding symptoms.

## 2019-12-16 NOTE — Transfer of Care (Signed)
Immediate Anesthesia Transfer of Care Note  Patient: Brian Burns  Procedure(s) Performed: CYSTOSCOPY WITH RETROGRADE, [facility] Internal Urethrotomy (N/A )  Patient Location: PACU  Anesthesia Type:General  Level of Consciousness: sedated  Airway & Oxygen Therapy: Patient Spontanous Breathing and Patient connected to face mask oxygen  Post-op Assessment: Report given to RN and Post -op Vital signs reviewed and stable  Post vital signs: Reviewed and stable  Last Vitals:  Vitals Value Taken Time  BP 159/99 12/16/19 1106  Temp 36.4 C 12/16/19 1106  Pulse    Resp 11 12/16/19 1108  SpO2    Vitals shown include unvalidated device data.  Last Pain:  Vitals:   12/16/19 0827  TempSrc: Oral  PainSc:       Patients Stated Pain Goal: 3 (12/16/19 2585)  Complications: No complications documented.

## 2019-12-17 ENCOUNTER — Encounter (HOSPITAL_COMMUNITY): Payer: Self-pay | Admitting: Urology

## 2019-12-27 DIAGNOSIS — N35011 Post-traumatic bulbous urethral stricture: Secondary | ICD-10-CM | POA: Diagnosis not present

## 2019-12-27 DIAGNOSIS — N3020 Other chronic cystitis without hematuria: Secondary | ICD-10-CM | POA: Diagnosis not present

## 2019-12-27 DIAGNOSIS — N35013 Post-traumatic anterior urethral stricture: Secondary | ICD-10-CM | POA: Diagnosis not present

## 2020-01-01 ENCOUNTER — Other Ambulatory Visit: Payer: Self-pay | Admitting: Adult Health

## 2020-01-01 DIAGNOSIS — R3914 Feeling of incomplete bladder emptying: Secondary | ICD-10-CM

## 2020-01-01 DIAGNOSIS — N401 Enlarged prostate with lower urinary tract symptoms: Secondary | ICD-10-CM

## 2020-01-05 DIAGNOSIS — N3020 Other chronic cystitis without hematuria: Secondary | ICD-10-CM | POA: Diagnosis not present

## 2020-01-05 DIAGNOSIS — N35011 Post-traumatic bulbous urethral stricture: Secondary | ICD-10-CM | POA: Diagnosis not present

## 2020-01-08 ENCOUNTER — Other Ambulatory Visit: Payer: Self-pay | Admitting: Adult Health

## 2020-01-08 DIAGNOSIS — N401 Enlarged prostate with lower urinary tract symptoms: Secondary | ICD-10-CM

## 2020-01-23 ENCOUNTER — Other Ambulatory Visit: Payer: Self-pay | Admitting: Family Medicine

## 2020-01-23 DIAGNOSIS — K8689 Other specified diseases of pancreas: Secondary | ICD-10-CM

## 2020-01-25 DIAGNOSIS — R338 Other retention of urine: Secondary | ICD-10-CM | POA: Diagnosis not present

## 2020-01-25 DIAGNOSIS — N35013 Post-traumatic anterior urethral stricture: Secondary | ICD-10-CM | POA: Diagnosis not present

## 2020-01-25 DIAGNOSIS — R300 Dysuria: Secondary | ICD-10-CM | POA: Diagnosis not present

## 2020-01-26 ENCOUNTER — Encounter
Payer: Medicare Other | Attending: Physical Medicine and Rehabilitation | Admitting: Physical Medicine and Rehabilitation

## 2020-01-26 DIAGNOSIS — R269 Unspecified abnormalities of gait and mobility: Secondary | ICD-10-CM | POA: Insufficient documentation

## 2020-01-26 DIAGNOSIS — M48061 Spinal stenosis, lumbar region without neurogenic claudication: Secondary | ICD-10-CM | POA: Insufficient documentation

## 2020-01-26 DIAGNOSIS — M48062 Spinal stenosis, lumbar region with neurogenic claudication: Secondary | ICD-10-CM | POA: Insufficient documentation

## 2020-01-31 ENCOUNTER — Other Ambulatory Visit: Payer: Self-pay

## 2020-01-31 ENCOUNTER — Encounter
Payer: Medicare Other | Attending: Physical Medicine and Rehabilitation | Admitting: Physical Medicine and Rehabilitation

## 2020-01-31 ENCOUNTER — Encounter: Payer: Self-pay | Admitting: Physical Medicine and Rehabilitation

## 2020-01-31 VITALS — BP 103/68 | HR 75 | Temp 97.3°F | Ht 76.0 in | Wt 191.2 lb

## 2020-01-31 DIAGNOSIS — M48061 Spinal stenosis, lumbar region without neurogenic claudication: Secondary | ICD-10-CM | POA: Insufficient documentation

## 2020-01-31 DIAGNOSIS — M545 Low back pain, unspecified: Secondary | ICD-10-CM | POA: Insufficient documentation

## 2020-01-31 DIAGNOSIS — G8929 Other chronic pain: Secondary | ICD-10-CM | POA: Insufficient documentation

## 2020-01-31 DIAGNOSIS — R269 Unspecified abnormalities of gait and mobility: Secondary | ICD-10-CM | POA: Insufficient documentation

## 2020-01-31 MED ORDER — TRAMADOL HCL 50 MG PO TABS: 50.0000 mg | ORAL_TABLET | Freq: Three times a day (TID) | ORAL | 5 refills | Status: DC | PRN

## 2020-01-31 NOTE — Progress Notes (Signed)
Subjective:    Patient ID: Brian Burns, male    DOB: December 25, 1945, 75 y.o.   MRN: [id number]  HPI   Patient is a 75 yr old male with hx Of DM- diet controlled- , AAA, CVA a few years ago; Afib, CAD, PVD, and B/L knee pain here for spinal stenosis f/u of chronic low back pain.   Still takes Tramadol- takes it 1x/day.  Taking a whole pill.   Bought a exercise ball- but was too small.  Needs to get a new one.   Knees are buckling when uses on cane-which he uses at home. suggest RW.    Has procedure- Urological procedure- for stricture- 11/18- went well.  Got UTI- has foley now.    No falls or near falls- just walks with legs in almost crouched gait.   Pain Inventory Average Pain 7 Pain Right Now 5 My pain is stabbing and aching  In the last 24 hours, has pain interfered with the following? General activity 8 Relation with others 8 Enjoyment of life 10 What TIME of day is your pain at its worst? varies Sleep (in general) Fair  Pain is worse with: walking and standing Pain improves with: rest Relief from Meds: 5  Family History  Problem Relation Age of Onset  . Lung disease Brother   . Kidney disease Brother   . Heart disease Brother        died at 89  . Heart disease Mother        died at 63  . Kidney disease Mother   . Lung disease Mother    Social History   Socioeconomic History  . Marital status: Married    Spouse name: Brian Burns  . Number of children: 2   . Years of education: 7  . Highest education level: 7th grade  Occupational History  . 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 Occupation: [employer]    Comment: on the weekends  Tobacco Use  . Smoking status: Former Smoker    Packs/day: 0.50    Years: 40.00    Pack years: 20.00    Types: Cigarettes    Quit date: 01/29/1999    Years since quitting: 21.0  . Smokeless tobacco: Never Used  Vaping Use  . Vaping Use: Never used  Substance and Sexual Activity   . Alcohol use: Never    Alcohol/week: 0.0 standard drinks  . Drug use: Never  . Sexual activity: Not Currently  Other Topics Concern  . Not on file  Social History Narrative   Brian Salway lives with his wife, Tupac Jeffus, in a one story, single family home dwelling with 4 steps into the home.    The Rippeon's receive Food & Nutrition Benefits and Social Security Income   Brian BRYLON BRENNING has given permission to share his private health information with his daughter, Brian Burns ([phone]) 12/03/18   Brian Burns is the agent for Brian Burns Power of Brian Burns- The document was signed by patient and Notarized in [facility] office on 12/03/18.    Ms Samule Ohm manages her father's finances   Social Determinants of Health   Financial Resource Strain: Not on file  Food Insecurity: Not on file  Transportation Needs: Not on file  Physical Activity: Not on file  Stress: Not on file  Social Connections: Not on file   Past Surgical History:  Procedure Laterality Date  . back injections  [facility]  . BALLOON DILATION N/A 12/16/2018   Procedure: URETHRAL BALLOON DILATION;  Surgeon: Crist Fat, MD;  Location: [facility];  Service: Urology;  Laterality: N/A;  . CARDIOVASCULAR STRESS TEST  08-09-2009   mild global hypokinesis,  no ischemia or scar/  ef 41%  . CATARACT EXTRACTION W/ INTRAOCULAR LENS  IMPLANT, BILATERAL  april and may 2014  . CYSTOSCOPY N/A 01/17/2017   Procedure: CYSTOSCOPY FLEXIBLE;  Surgeon: Crist Fat, MD;  Location: [facility];  Service: Urology;  Laterality: N/A;  . CYSTOSCOPY WITH RETROGRADE URETHROGRAM N/A 11/24/2014   Procedure: CYSTOSCOPY WITH RETROGRADE URETHROGRAM;  Surgeon: Crist Fat, MD;  [facility];  Service: Urology;  Laterality: N/A;  . CYSTOSCOPY WITH RETROGRADE URETHROGRAM N/A 12/16/2018   Procedure: CYSTOSCOPY WITH RETROGRADE URETHROGRAM;  Surgeon: Crist Fat, MD;  Location: [facility];   Service: Urology;  Laterality: N/A;  . CYSTOSCOPY WITH RETROGRADE URETHROGRAM N/A 12/16/2019   Procedure: CYSTOSCOPY WITH RETROGRADE, Direct Vision Internal Urethrotomy;  Surgeon: Crist Fat, MD;  Location: [facility];  Service: Urology;  Laterality: N/A;  . CYSTOSCOPY WITH URETHRAL DILATATION N/A 11/24/2014   Procedure: CYSTOSCOPY WITH URETHRAL DILATATION;  Surgeon: Crist Fat, MD;  Location[facility];  Service: Urology;  Laterality: N/A;  BALLOON DILATION        . ESOPHAGOGASTRODUODENOSCOPY N/A 05/15/2014   Procedure: ESOPHAGOGASTRODUODENOSCOPY (EGD);  Surgeon: Carman Ching, MD;  Location: [facility];  Service: Endoscopy;  Laterality: N/A;  . ESOPHAGOGASTRODUODENOSCOPY Left 02/02/2015   Procedure: ESOPHAGOGASTRODUODENOSCOPY (EGD);  Surgeon: Dorena Cookey, MD;  Location[facility];  Service: Endoscopy;  Laterality: Left;  . ESOPHAGOGASTRODUODENOSCOPY Left 03/16/2015   Procedure: ESOPHAGOGASTRODUODENOSCOPY (EGD);  Surgeon: Dorena Cookey, MD;  Location: [facility];  Service: Endoscopy;  Laterality: Left;  . TRANSTHORACIC ECHOCARDIOGRAM  01-07-2012   mild to moderate dilated LV,  ef 45-50%,  mild basal hypokinesis,  grade I diastolic  dysfunction/  trivial AR/  mild Brian  . URETHROPLASTY N/A 01/17/2017   Procedure: URETHEROPLASTY BUCCAL GRAFT AND BUCCAL MUCOSA GRAFT HARVEST;  Surgeon: Crist Fat, MD;  Location: [facility];  Service: Urology;  Laterality: N/A;   Past Surgical History:  Procedure Laterality Date  . back injections     [facility]  . BALLOON DILATION N/A 12/16/2018   Procedure: URETHRAL BALLOON DILATION;  Surgeon: Crist Fat, MD;  Location: [facility];  Service: Urology;  Laterality: N/A;  . CARDIOVASCULAR STRESS TEST  08-09-2009   mild global hypokinesis,  no ischemia or scar/  ef 41%  . CATARACT EXTRACTION W/ INTRAOCULAR LENS  IMPLANT, BILATERAL  april and may 2014  . CYSTOSCOPY N/A 01/17/2017   Procedure: CYSTOSCOPY  FLEXIBLE;  Surgeon: Crist Fat, MD;  Location: [facility];  Service: Urology;  Laterality: N/A;  . CYSTOSCOPY WITH RETROGRADE URETHROGRAM N/A 11/24/2014   Procedure: CYSTOSCOPY WITH RETROGRADE URETHROGRAM;  Surgeon: Crist Fat, MD;  Location[facility];  Service: Urology;  Laterality: N/A;  . CYSTOSCOPY WITH RETROGRADE URETHROGRAM N/A 12/16/2018   Procedure: CYSTOSCOPY WITH RETROGRADE URETHROGRAM;  Surgeon: Crist Fat, MD;  Location: [facility];  Service: Urology;  Laterality: N/A;  . CYSTOSCOPY WITH RETROGRADE URETHROGRAM N/A 12/16/2019   Procedure: CYSTOSCOPY WITH RETROGRADE, Direct Vision Internal Urethrotomy;  Surgeon: Crist Fat, MD;  Location: [facility];  Service: Urology;  Laterality: N/A;  . CYSTOSCOPY WITH URETHRAL DILATATION N/A 11/24/2014   Procedure: CYSTOSCOPY WITH URETHRAL DILATATION;  Surgeon: Crist Fat, MD;  Location: [place];  Service: Urology;  Laterality: N/A;  BALLOON DILATION        . ESOPHAGOGASTRODUODENOSCOPY N/A 05/15/2014   Procedure: ESOPHAGOGASTRODUODENOSCOPY (EGD);  Surgeon: Laurence Spates, MD;  Location: [facility];  Service: Endoscopy;  Laterality: N/A;  . ESOPHAGOGASTRODUODENOSCOPY Left 02/02/2015   Procedure: ESOPHAGOGASTRODUODENOSCOPY (EGD);  Surgeon: Teena Irani, MD;  Location: [facility] END[facility]COPY;  Service: Endoscopy;  Laterality: Left;  . ESOPHAGOGASTRODUODENOSCOPY Left 03/16/2015   Procedure: ESOPHAGOGASTRODUODENOSCOPY (EGD);  Surgeon: Teena Irani, MD;  Location: [facility];  Service: Endoscopy;  Laterality: Left;  . TRANSTHORACIC ECHOCARDIOGRAM  01-07-2012   mild to moderate dilated LV,  ef 45-50%,  mild basal hypokinesis,  grade I diastolic  dysfunction/  trivial AR/  mild Brian  . URETHROPLASTY N/A 01/17/2017   Procedure: URETHEROPLASTY BUCCAL GRAFT AND BUCCAL MUCOSA GRAFT HARVEST;  Surgeon: Ardis Hughs, MD;  Location: [facility];  Service: Urology;  Laterality: N/A;   Past Medical History:   Diagnosis Date  . AAA (abdominal aortic aneurysm) without rupture ([place]) 06/2014   no change in last exam from 2015  . Achalasia   . Anxiety   . Arthritis    knees,lower back  . Atrial fibrillation ([place])   . Atrial flutter with rapid ventricular response ([place]) 01/16/2015  . Bilateral wrist pain 09/09/2014  . Bladder outlet obstruction 01/20/2015  . Bradycardia 08/27/2016  . CAD (coronary artery disease)    pt denies  . Carotid artery disease ([place])   . Chronic back pain   . Chronic kidney disease (CKD), stage III (moderate) ([facility])   . Decreased visual acuity of left eye  12/02/2018  . Depression   . Dyspnea    reports today no pregoression  . Erectile dysfunction   . GERD (gastroesophageal reflux disease)   . Headache    hx of  . History of CVA (cerebrovascular accident)    2009  &  2013  . History of Multiple lacunar infarcts ([place]) 01/31/2015   MRI Brain 01/31/2015: Widespread old lacunar infarcts, particularly right Basal ganglia, pons, and right cerebellum  . HLD (hyperlipidemia)   . Hypertension   . Idiopathic pancreatitis 12/2014   acute  . Knee pain, bilateral 04/09/2011  . Malnutrition of moderate degree 01/25/2015  . Memory impairment    mild dementia per PCP progress note   . Morbid obesity ([place]) 12/03/2018  . Other abnormalities of gait and mobility 12/03/2018  . PVD (peripheral vascular disease) with claudication ([place])   . Urethral stricture 02/16/2013  . Urge incontinence 12/08/2018   BP 103/68   Pulse 75   Temp (!) 97.3 F (36.3 C)   Ht 6\' 4"  (1.93 m)   Wt 191 lb 3.2 oz (86.7 kg)   SpO2 98%   BMI 23.27 kg/m   Opioid Risk Score:   Fall Risk Score:  `1  Depression screen PHQ 2/9  Depression screen [facility] 2/9 09/27/2019 08/25/2019 06/18/2019 12/03/2018 10/29/2018 06/17/2018 04/15/2018  Decreased Interest 2 0 0 1 0 1 0  Down, Depressed, Hopeless 0 0 0 0 0 0 0  PHQ - 2 Score 2 0 0 1 0 1 0  Altered sleeping 2 0 - - - - -  Tired, decreased energy 1 0 - - - - -  Change  in appetite 2 - - - - - -  Feeling bad or failure about yourself  1 0 - - - - -  Trouble concentrating 1 0 - - - - -  Moving slowly or fidgety/restless  2 0 - - - - -  Suicidal thoughts 0 0 - - - - -  PHQ-9 Score 11 0 - - - - -  Difficult doing work/chores Somewhat difficult - - - - - -  Some recent data might be hidden    Review of Systems  Musculoskeletal: Positive for back pain.  All other systems reviewed and are negative.      Objective:   Physical Exam  Awake, but sleepy, accompanied by daughter, in hospital w/c, NAD Keeps closing eyes TTP just right of midline on lower lumbar area Pain worse with extension than flexion.  Knee buckling intermittently- doesn't fall.        Assessment & Plan:    Patient is a 75 yr old male with hx Of DM- diet controlled- , AAA, CVA a few years ago; Afib, CAD, PVD, and B/L knee pain here for spinal stenosis f/u of chronic low back pain.   1. Has to take Tramadol 50 mg 2-3x/day to work- it wears off, so likely doesn't help as much when taken 1x/day.  Can increase if needed. Please call me at 2-3 weeks, to see how it works at 1 tab 2-3x/day.  Will refill tramadol 50 mg 3x/day as needed- #90- 5 refills. Tylenol makes Tramadol LAST LONGER   2. Suggest ordering exercise ball online- [employer]. To find one tall enough for him. Measure from floor to his backside on chair- knees at 90 degrees, will give [place] the right height.  - then sit on ball IN CORNER daily- 30+ minutes/day minimum!  3. Suggest using rolling walker as MUCH as possible. To avoid falling!  4. Will wait on trigger point injections, since muscle spasms are doing better.  Will wait for since don't want him to think they don't work.   5. Don't see why so sleepy, based on meds- went over them, and there are a few that rarely cause sleepiness, nods off frequently-   6. Try to sleep at night- and therefore stay more awake during day.   7. Melatonin- 3-6 mg nightly- takes ~ 1 hour to  work. Is over the counter.   8. F/U in 2 months. Call in 3 weeks or so if wants [place] to increase pain meds  I spent a total of 30 minutes on visit- as detailed above.

## 2020-01-31 NOTE — Patient Instructions (Signed)
  Patient is a 75 yr old male with hx Of DM- diet controlled- , AAA, CVA a few years ago; Afib, CAD, PVD, and B/L knee pain here for spinal stenosis f/u of chronic low back pain.   1. Has to take Tramadol 50 mg 2-3x/day to work- it wears off, so likely doesn't help as much when taken 1x/day.  Can increase if needed. Please call me at 2-3 weeks, to see how it works at 1 tab 2-3x/day.  Will refill tramadol 50 mg 3x/day as needed- #90- 5 refills. Tylenol makes Tramadol LAST LONGER   2. Suggest ordering exercise ball online- [employer]. To find one tall enough for him. Measure from floor to his backside on chair- knees at 90 degrees, will give [place] the right height.  - then sit on ball IN CORNER daily- 30+ minutes/day minimum!  3. Suggest using rolling walker as MUCH as possible. To avoid falling!  4. Will wait on trigger point injections, since muscle spasms are doing better.  Will wait for since don't want him to think they don't work.   5. Don't see why so sleepy, based on meds- went over them, and there are a few that rarely cause sleepiness, nods off frequently-   6. Try to sleep at night- and therefore stay more awake during day.   7. Melatonin- 3-6 mg nightly- takes ~ 1 hour to work. Is over the counter.   8. F/U in 2 months. Call in 3 weeks or so if wants [place] to increase pain meds

## 2020-02-08 ENCOUNTER — Encounter: Payer: Self-pay | Admitting: Podiatry

## 2020-02-08 ENCOUNTER — Ambulatory Visit (INDEPENDENT_AMBULATORY_CARE_PROVIDER_SITE_OTHER): Payer: Medicare Other | Admitting: Podiatry

## 2020-02-08 ENCOUNTER — Other Ambulatory Visit: Payer: Self-pay

## 2020-02-08 DIAGNOSIS — L853 Xerosis cutis: Secondary | ICD-10-CM | POA: Diagnosis not present

## 2020-02-08 DIAGNOSIS — M79675 Pain in left toe(s): Secondary | ICD-10-CM | POA: Diagnosis not present

## 2020-02-08 DIAGNOSIS — M79674 Pain in right toe(s): Secondary | ICD-10-CM

## 2020-02-08 DIAGNOSIS — E119 Type 2 diabetes mellitus without complications: Secondary | ICD-10-CM

## 2020-02-08 DIAGNOSIS — B351 Tinea unguium: Secondary | ICD-10-CM

## 2020-02-08 NOTE — Patient Instructions (Signed)
WEEKLY FOOT SOAK INSTRUCTIONS FOR FOOT HYGIENE   1. SOAK FEET IN LUKEWARM SOAPY WATER FOR 10 MINUTES. HAVE A FAMILY MEMBER OR CAREGIVER CHECK THE WATER TEMPERATURE FOR YOU BEFORE SUBMERGING YOUR FEET IN THE WATER.  2.  DRY FEET WELL TAKING CARE TO DRY WELL BETWEEN TOES AND UNDER TOES.  3.  APPLY MOISTURIZING CREAM TO FEET AVOIDING APPLICATION BETWEEN TOES. For normal skin: Moisturize feet once daily; do not apply between toes A.  CeraVe Daily Moisturizing Lotion B.  Vaseline Intensive Care Lotion C.  Lubriderm Lotion D.  Gold Bond Diabetic Foot Lotion E.  Eucerin Intensive Repair Moisturizing Lotion  For extremely dry, cracked feet: moisturize feet once daily; do not apply between toes A. CeraVe Healing Ointment B. Aquaphor Healing Ointment C. Vaseline Petroleum Healing Jelly   If you have problems reaching your feet: apply to feet once daily; do not apply between toes A.  Aquaphor Advanced Therapy Ointment Body Spray B.  Vaseline Intensive Care Spray Lotion Advanced Repair

## 2020-02-12 ENCOUNTER — Encounter: Payer: Self-pay | Admitting: Podiatry

## 2020-02-12 NOTE — Progress Notes (Signed)
Subjective:  Patient ID: Brian Burns, male    DOB: 1945/03/05,  MRN: [id number]  75 y.o. male presents with preventative diabetic foot care and painful callus(es) b/l and painful thick toenails that are difficult to trim. Painful toenails interfere with ambulation. Aggravating factors include wearing enclosed shoe gear. Pain is relieved with periodic professional debridement. Painful calluses are aggravated when weightbearing with and without shoegear. Pain is relieved with periodic professional debridement.   Patient's daughter is present during today's visit. States her Dad has been hospitalized since his last visit with [place].  Review of Systems: +Foley.  Denies N/V/F/Ch.  Past Medical History:  Diagnosis Date  . AAA (abdominal aortic aneurysm) without rupture (HCC) 06/2014   no change in last exam from 2015  . Achalasia   . Anxiety   . Arthritis    knees,lower back  . Atrial fibrillation (HCC)   . Atrial flutter with rapid ventricular response (HCC) 01/16/2015  . Bilateral wrist pain 09/09/2014  . Bladder outlet obstruction 01/20/2015  . Bradycardia 08/27/2016  . CAD (coronary artery disease)    pt denies  . Carotid artery disease (HCC)   . Chronic back pain   . Chronic kidney disease (CKD), stage III (moderate) (HCC)   . Decreased visual acuity of left eye  12/02/2018  . Depression   . Dyspnea    reports today no pregoression  . Erectile dysfunction   . GERD (gastroesophageal reflux disease)   . Headache    hx of  . History of CVA (cerebrovascular accident)    2009  &  2013  . History of Multiple lacunar infarcts (HCC) 01/31/2015   MRI Brain 01/31/2015: Widespread old lacunar infarcts, particularly right Basal ganglia, pons, and right cerebellum  . HLD (hyperlipidemia)   . Hypertension   . Idiopathic pancreatitis 12/2014   acute  . Knee pain, bilateral 04/09/2011  . Malnutrition of moderate degree 01/25/2015  . Memory impairment    mild dementia per PCP progress note   .  Morbid obesity (HCC) 12/03/2018  . Other abnormalities of gait and mobility 12/03/2018  . PVD (peripheral vascular disease) with claudication (HCC)   . Urethral stricture 02/16/2013  . Urge incontinence 12/08/2018   Past Surgical History:  Procedure Laterality Date  . back injections     [facility]  . BALLOON DILATION N/A 12/16/2018   Procedure: URETHRAL BALLOON DILATION;  Surgeon: Crist Fat, MD;  Location: [facility];  Service: Urology;  Laterality: N/A;  . CARDIOVASCULAR STRESS TEST  08-09-2009   mild global hypokinesis,  no ischemia or scar/  ef 41%  . CATARACT EXTRACTION W/ INTRAOCULAR LENS  IMPLANT, BILATERAL  april and may 2014  . CYSTOSCOPY N/A 01/17/2017   Procedure: CYSTOSCOPY FLEXIBLE;  Surgeon: Crist Fat, MD;  Location: [facility];  Service: Urology;  Laterality: N/A;  . CYSTOSCOPY WITH RETROGRADE URETHROGRAM N/A 11/24/2014   Procedure: CYSTOSCOPY WITH RETROGRADE URETHROGRAM;  Surgeon: Crist Fat, MD;  Location: [facility];  Service: Urology;  Laterality: N/A;  . CYSTOSCOPY WITH RETROGRADE URETHROGRAM N/A 12/16/2018   Procedure: CYSTOSCOPY WITH RETROGRADE URETHROGRAM;  Surgeon: Crist Fat, MD;  Location: [facility];  Service: Urology;  Laterality: N/A;  . CYSTOSCOPY WITH RETROGRADE URETHROGRAM N/A 12/16/2019   Procedure: CYSTOSCOPY WITH RETROGRADE, [facility] Vision Internal Urethrotomy;  Surgeon: Crist Fat, MD;  Location: [facility];  Service: Urology;  Laterality: N/A;  . CYSTOSCOPY WITH URETHRAL DILATATION N/A 11/24/2014   Procedure: CYSTOSCOPY WITH URETHRAL DILATATION;  Surgeon: Crist Fat, MD;  Location: [facility];  Service: Urology;  Laterality: N/A;  BALLOON DILATION        . ESOPHAGOGASTRODUODENOSCOPY N/A 05/15/2014   Procedure: ESOPHAGOGASTRODUODENOSCOPY (EGD);  Surgeon: Carman Ching, MD;  Location: [facility];  Service: Endoscopy;  Laterality: N/A;  . ESOPHAGOGASTRODUODENOSCOPY Left  02/02/2015   Procedure: ESOPHAGOGASTRODUODENOSCOPY (EGD);  Surgeon: Dorena Cookey, MD;  Location: [facility];  Service: Endoscopy;  Laterality: Left;  . ESOPHAGOGASTRODUODENOSCOPY Left 03/16/2015   Procedure: ESOPHAGOGASTRODUODENOSCOPY (EGD);  Surgeon: Dorena Cookey, MD;  Location: [facility];  Service: Endoscopy;  Laterality: Left;  . TRANSTHORACIC ECHOCARDIOGRAM  01-07-2012   mild to moderate dilated LV,  ef 45-50%,  mild basal hypokinesis,  grade I diastolic  dysfunction/  trivial AR/  mild MR  . URETHROPLASTY N/A 01/17/2017   Procedure: URETHEROPLASTY BUCCAL GRAFT AND BUCCAL MUCOSA GRAFT HARVEST;  Surgeon: Crist Fat, MD;  Location: [facility];  Service: Urology;  Laterality: N/A;   Patient Active Problem List   Diagnosis Date Noted  . Abnormality of gait 09/27/2019  . Seizure (HCC)   . Sepsis (HCC) 05/25/2019  . Urge incontinence 12/08/2018  . Stenosis of lateral recess of lumbar spine 12/08/2018  . Dementia, vascular (HCC) 12/08/2018  . Overweight (BMI 25.0-29.9) 12/03/2018  . Decreased visual acuity of left eye  12/02/2018  . Gross hematuria 10/29/2018  . Other abnormalities of gait and mobility 09/02/2018  . Muscle weakness (generalized) 04/08/2018  . Lumbar spinal stenosis 04/05/2018  . Chronic pain of right knee 03/07/2017  . Chronic low back pain without sciatica 03/07/2017  . Nonischemic cardiomyopathy (HCC) 09/25/2016  . Microalbuminuria 07/26/2015  . Paroxysmal atrial fibrillation (HCC)   . Coronary artery disease involving native coronary artery of native heart without angina pectoris   . Stricture, urethra   . CKD stage 3 due to type 2 diabetes mellitus (HCC) 03/16/2015  . Depression 03/16/2015  . Normocytic anemia   . History of Multiple lacunar infarcts (HCC) 01/31/2015  . GERD (gastroesophageal reflux disease) 10/21/2013  . AAA (abdominal aortic aneurysm) without rupture (HCC) 06/24/2013  . PVD (peripheral vascular disease) (HCC) 06/24/2013  . Orthostatic  dizziness 03/18/2013  . Hearing loss of both ears 03/18/2013  . BPH (benign prostatic hyperplasia) 02/16/2013  . Type 2 diabetes mellitus with diabetic neuropathy, unspecified (HCC) 05/10/2012  . Type 2 diabetes mellitus with hypercholesterolemia (HCC) 02/05/2007  . Hypertension associated with diabetes (HCC) 03/27/2006    Current Outpatient Medications:  .  acetaminophen (TYLENOL) 325 MG tablet, Take 325 mg by mouth every 6 (six) hours as needed for moderate pain., Disp: , Rfl:  .  amLODipine (NORVASC) 10 MG tablet, Take 10 mg by mouth daily., Disp: , Rfl:  .  amoxicillin-clavulanate (AUGMENTIN) 875-125 MG tablet, Take 1 tablet by mouth 2 (two) times daily., Disp: , Rfl:  .  atorvastatin (LIPITOR) 40 MG tablet, Take 1 tablet (40 mg total) by mouth daily., Disp: 90 tablet, Rfl: 2 .  Cholecalciferol (VITAMIN D) 50 MCG (2000 UT) tablet, Take 2,000 Units by mouth daily., Disp: , Rfl:  .  ciprofloxacin (CIPRO) 250 MG tablet, Take 250 mg by mouth 2 (two) times daily., Disp: , Rfl:  .  citalopram (CELEXA) 40 MG tablet, Take 40 mg by mouth daily., Disp: , Rfl:  .  DULoxetine (CYMBALTA) 30 MG capsule, Take 30 mg by mouth 2 (two) times daily., Disp: , Rfl:  .  DULoxetine (CYMBALTA) 60 MG capsule, Take 1 capsule (60 mg total) by mouth  at bedtime. For back pain/nerve pain, Disp: 30 capsule, Rfl: 5 .  ferrous sulfate 324 MG TBEC, Take 1 tablet (324 mg total) by mouth daily with breakfast., Disp: 30 tablet, Rfl: 0 .  finasteride (PROSCAR) 5 MG tablet, Take 1 tablet (5 mg total) by mouth at bedtime., Disp: 30 tablet, Rfl: 0 .  lipase/protease/amylase (CREON) 12000-38000 units CPEP capsule, TAKE 1 CAPSULE BY MOUTH THREE TIMES DAILY WITH MEALS, Disp: 270 capsule, Rfl: 1 .  lisinopril (ZESTRIL) 10 MG tablet, Take 10 mg by mouth at bedtime., Disp: , Rfl:  .  lisinopril (ZESTRIL) 5 MG tablet, Take 1 tablet (5 mg total) by mouth at bedtime., Disp: 90 tablet, Rfl: 3 .  Rivaroxaban (XARELTO) 15 MG TABS tablet, Take  1 tablet (15 mg total) by mouth at bedtime. With a meal., Disp: 30 tablet, Rfl: 0 .  tamsulosin (FLOMAX) 0.4 MG CAPS capsule, TAKE 1 CAPSULE(0.4 MG) BY MOUTH DAILY (Patient taking differently: Take 0.4 mg by mouth daily.), Disp: 30 capsule, Rfl: 0 .  traMADol (ULTRAM) 50 MG tablet, Take 1 tablet (50 mg total) by mouth 3 (three) times daily as needed for moderate pain., Disp: 90 tablet, Rfl: 5   Allergies  Allergen Reactions  . Gabapentin Other (See Comments)    Sedation at 300mg  TID   Social History   Tobacco Use  Smoking Status Former Smoker  . Packs/day: 0.50  . Years: 40.00  . Pack years: 20.00  . Types: Cigarettes  . Quit date: 01/29/1999  . Years since quitting: 21.0  Smokeless Tobacco Never Used   Objective:   Constitutional Well developed. Well nourished.  Vascular Dorsalis pedis pulses palpable bilaterally. Posterior tibial pulses palpable bilaterally. Capillary refill normal to all digits. No cyanosis or clubbing noted. Pedal hair growth absent b/l.   Neurologic Normal speech. Oriented to person, place, and time. Epicritic sensation to light touch grossly present bilaterally.  Dermatologic Toenails 1-5 b/l are painful, elongated, discolored, dystrophic with subungual debris. Pain with dorsal palpation of nailplates. No erythema, no edema, no drainage noted. No open wounds. No skin lesions. Poor pedal hygiene with interdigital debris b/l feet. Dry, flaky skin noted BLE.  Orthopedic: Normal joint ROM without pain or crepitus bilaterally. No visible deformities. No bony tenderness. Wheelchair bound.   Radiographs: None Assessment:   1. Pain due to onychomycosis of toenails of both feet   2. Xerosis cutis   3. Controlled type 2 diabetes mellitus without complication, without long-term current use of insulin (HCC)    Plan:  Patient was evaluated and treated and all questions answered.  Onychomycosis with pain -Nails palliatively debridement as below. -Educated on  self-care  Procedure: Nail Debridement Rationale: Pain Type of Debridement: manual, sharp debridement. Instrumentation: Nail nipper, rotary burr. Number of Nails: 10  -Examined patient. -Continue diabetic foot care principles. -Patient to continue soft, supportive shoe gear daily. -Toenails 1-5 b/l were debrided in length and girth with sterile nail nippers and dremel without iatrogenic bleeding.  -Instructions given for weekly foot soaks for pedal hygiene. List of moisturizers dispensed today as well. -Patient to report any pedal injuries to medical professional immediately. -Patient/POA to call should there be question/concern in the interim.  Return in about 3 months (around 05/08/2020).  07/08/2020, DPM

## 2020-02-15 ENCOUNTER — Other Ambulatory Visit: Payer: Self-pay | Admitting: Adult Health

## 2020-02-15 DIAGNOSIS — N401 Enlarged prostate with lower urinary tract symptoms: Secondary | ICD-10-CM

## 2020-02-15 DIAGNOSIS — R3914 Feeling of incomplete bladder emptying: Secondary | ICD-10-CM

## 2020-02-18 DIAGNOSIS — R338 Other retention of urine: Secondary | ICD-10-CM | POA: Diagnosis not present

## 2020-02-18 DIAGNOSIS — N3020 Other chronic cystitis without hematuria: Secondary | ICD-10-CM | POA: Diagnosis not present

## 2020-02-18 DIAGNOSIS — N35013 Post-traumatic anterior urethral stricture: Secondary | ICD-10-CM | POA: Diagnosis not present

## 2020-02-22 DIAGNOSIS — R300 Dysuria: Secondary | ICD-10-CM | POA: Diagnosis not present

## 2020-02-22 DIAGNOSIS — N3020 Other chronic cystitis without hematuria: Secondary | ICD-10-CM | POA: Diagnosis not present

## 2020-02-22 DIAGNOSIS — N35011 Post-traumatic bulbous urethral stricture: Secondary | ICD-10-CM | POA: Diagnosis not present

## 2020-02-25 DIAGNOSIS — R300 Dysuria: Secondary | ICD-10-CM | POA: Diagnosis not present

## 2020-02-25 DIAGNOSIS — N35013 Post-traumatic anterior urethral stricture: Secondary | ICD-10-CM | POA: Diagnosis not present

## 2020-02-25 DIAGNOSIS — N3020 Other chronic cystitis without hematuria: Secondary | ICD-10-CM | POA: Diagnosis not present

## 2020-03-03 DIAGNOSIS — N139 Obstructive and reflux uropathy, unspecified: Secondary | ICD-10-CM | POA: Diagnosis not present

## 2020-03-03 DIAGNOSIS — N35013 Post-traumatic anterior urethral stricture: Secondary | ICD-10-CM | POA: Diagnosis not present

## 2020-03-03 DIAGNOSIS — N3020 Other chronic cystitis without hematuria: Secondary | ICD-10-CM | POA: Diagnosis not present

## 2020-03-04 ENCOUNTER — Other Ambulatory Visit: Payer: Self-pay | Admitting: Adult Health

## 2020-03-04 DIAGNOSIS — I480 Paroxysmal atrial fibrillation: Secondary | ICD-10-CM

## 2020-03-11 ENCOUNTER — Other Ambulatory Visit: Payer: Self-pay | Admitting: Family Medicine

## 2020-03-11 DIAGNOSIS — I480 Paroxysmal atrial fibrillation: Secondary | ICD-10-CM

## 2020-03-27 DIAGNOSIS — N3020 Other chronic cystitis without hematuria: Secondary | ICD-10-CM | POA: Diagnosis not present

## 2020-03-27 DIAGNOSIS — R338 Other retention of urine: Secondary | ICD-10-CM | POA: Diagnosis not present

## 2020-03-28 NOTE — Progress Notes (Signed)
Subjective:   Patient ID: Brian Burns    DOB: 09-Jul-1945, 75 y.o. male   MRN: [id number]  GIBRIL MASTRO is a 75 y.o. male with a history of AAA without rupture, CAD, HTN, NICM, Paroxysmal a-fib, PVD, GERD, CKD III, T2DM, vascular dementia, hearing loss, h/o multiple lacunar infarcts, BPH, gross hematuria, urethra stricture, abdnormal gait, chronic low back pain without sciatica, decreased vision, depression, microalbuminuria, lumbar spinal stenosis, chronic muscle weakness 2/2 to lumbar stenosis, normocytic anemia, orthostatic dizziness, urge incontinence, overweight, seizure, here for "getting weaker, desire for wheelchair".  Weakness  Falls  Confusion : Patient and daughter present today for further to discuss feeling more weak and desire for wheelchair. They also note gradual weight loss and memory changes. Daughter notes that he has fallen 4 times in the last 1.5 weeks. He does not get around much anymore but has to walk to the bathroom and often his legs give out and he falls. Denies head trauma or LOC. Denies any pain or concerns for fracture. Daughter also notes that he seems to have more periods of confusion where he will ask odd questions such as waking up at night saying he needs to go to work. Daughter endorses continued weight loss and feels this is contributing to his weakness. She notes that he eats three meals a day but they are definitely not as "hearty" as they used to.   He has chronic back pain 2/2 spinal stenosis confirmed on MRI and generalized weakness. This affects his quality of life and ability to complete his ADL/IADLs. Has been following with [facility] where he has been evaluated. He is noted toh ave good strength but significant pain with lumbar extension. His gait is crouched/flexed, requiring much of his weight bearing on his rolling walker. He current pain management includes Duloxetine 60mg  QD, Tramadol/Tylenol.  He has been undergoing home exercise program 5 days per  week. His knees are buckling when he uses a cane. Notes not using rolling walker that often any more.  She does feel the weakness, weight loss, and confusion have gradually declined.  He was evaluated in geriatric clinic in 2019. He is diagnosed with vascular dementia.  Denies any acute unilateral weakness, slurred speech, facial droop.   Review of Systems:  Per HPI.   Objective:   BP 95/80   Pulse 79   Ht 6\' 4"  (1.93 m)   Wt 180 lb 3.2 oz (81.7 kg)   SpO2 98%   BMI 21.93 kg/m  Vitals and nursing note reviewed.  General: pleasant tall thin elderly male, appears tired in room but alert, appears uncomfortable at times in wheel chair, well developed, in no acute distress with non-toxic appearance Resp: breathing comfortably on room air, speaking in full sentences MSK: daughter decline gait evaluation due to weakness and fear of falling Neuro: Alert and oriented, speech normal  MRI Lumbar Spine: 04/11/2018 IMPRESSION: L3-4: Multifactorial spinal stenosis. 2 mm of anterolisthesis because of facet osteoarthritis. Endplate osteophytes and bulging of the disc. Abundant epidural fat. Constriction of the thecal sac at this level could result in symptoms of stenosis.  L4-5: Endplate osteophytes and shallow protrusion of the disc. Facet and ligamentous hypertrophy. Stenosis of the lateral recesses that could cause neural compression on either or both sides. Asymmetric foraminal stenosis on the left because of encroachment by osteophyte and bulging disc material could affect the left L4 nerve.  Distal infrarenal abdominal aortic aneurysm, maximal transverse diameter 3.5 cm. Recommend followup by ultrasound in 2  years. This recommendation follows ACR consensus guidelines: White Paper of the ACR Incidental Findings Committee II on Vascular Findings. J Am Coll Radiol 2013; 10:789-794. Aortic aneurysm NOS (ICD10-I71.9)  CT Head without contrast: 05/25/19: IMPRESSION: Chronic  microvascular ischemia and generalized atrophy without acute intracranial abnormality.  MRI Brain 01/31/2015: Remote basal ganglia, pontine and right cerebellar infarcts. Moderate small vessel disease type changes. Global atrophy without hydrocephalus.  Assessment & Plan:   Muscle weakness (generalized) Chronic and gradually worsening.  Suspect constitution of memory changes, weakness, more frequent falls, and weight loss are a progression of his dementia and health decline. Discussed this with daughter. She does note he has a living will and states that he desires comfort over aggressive treatment.  Discussed that he would benefit from PT/OT to help improve number of falls as well as bed side commode. DME order for bedside commode, wheelchair and Home health PT/OT/aid ordered.  Discussed that if he continues to decline that wife/daughter may become incapable of carrying for him at home. Plan to further discuss at follow up visit.  Would also likely benefit from medication review to minimize number of unneeded medications that could be contributing to his confusion and falls. His blood pressure is soft as well and would likely benefit from adjustment there as well.  Will obtain basic labs (CBC w/ diff, B12, TSH, CMP, Vit D) to evaluate and have patient follow up in 2 weeks to further discuss. Considered more aggressive imaging however given image findings in past and no sudden acute changes do not feel it would change management at this time.  Orders Placed This Encounter  Procedures  . DME Wheelchair manual    Patient suffers from unstable ambulation and generalized weakness which impairs their ability to perform daily activities like bathing, dressing, grooming and toileting in the home.  A cane, crutch or walker will not resolve issue with performing activities of daily living. A wheelchair will allow patient to safely perform daily activities. Patient can safely propel the wheelchair in the  home or has a caregiver who can provide assistance. Length of need Lifetime. Accessories: elevating leg rests (ELRs), wheel locks, extensions and anti-tippers.  . DME Bedside commode    Order Specific Question:   Patient needs a bedside commode to treat with the following condition    Answer:   Generalized weakness [638756]    Order Specific Question:   Patient needs a bedside commode to treat with the following condition    Answer:   Unstable gait [289158]    Order Specific Question:   Patient needs a bedside commode to treat with the following condition    Answer:   Frequent falls [697756]    Order Specific Question:   Patient needs a bedside commode to treat with the following condition    Answer:   Dementia (HCC) [433295]  . CBC with Differential  . Comprehensive metabolic panel  . TSH  . Vitamin B12  . Vitamin D, 25-hydroxy  . Home Health    Order Specific Question:   To provide the following care/treatments    Answer:   PT    Order Specific Question:   To provide the following care/treatments    Answer:   OT    Order Specific Question:   To provide the following care/treatments    Answer:   Home Health Aide  . Face-to-face encounter (required for Medicare/Medicaid patients)    I Joana Reamer certify that this patient is under my care and  that I, or a nurse practitioner or physician's assistant working with me, had a face-to-face encounter that meets the physician face-to-face encounter requirements with this patient on 03/29/2020. The encounter with the patient was in whole, or in part for the following medical condition(s) which is the primary reason for home health care (List medical condition): unstable gait, generalized weakness, difficulty ambulating, frequent falls, vascular dementia with continued decline.    Order Specific Question:   The encounter with the patient was in whole, or in part, for the following medical condition, which is the primary reason for home health care     Answer:   unstable gait, generalized weakness, difficulty ambulating, frequent falls, vascular dementia with continued decline.    Order Specific Question:   I certify that, based on my findings, the following services are medically necessary home health services    Answer:   Physical therapy    Order Specific Question:   Reason for Medically Necessary Home Health Services    Answer:   Skilled Nursing- Change/Decline in Patient Status    Order Specific Question:   Reason for Medically Necessary Home Health Services    Answer:   Skilled Nursing- Skilled Assessment/Observation    Order Specific Question:   Reason for Medically Necessary Home Health Services    Answer:   Therapy- Investment banker, operational, Oceanographer Specific Question:   Reason for Medically Necessary Home Health Services    Answer:   Therapy- Instruction on use of Assistive Device for Ambulation on all Surfaces    Order Specific Question:   Reason for Medically Necessary Home Health Services    Answer:   Therapy- Instruction on Safe use of Assistive Devices for ADLs    Order Specific Question:   My clinical findings support the need for the above services    Answer:   Cognitive impairments, dementia, or mental confusion  that make it unsafe to leave home    Order Specific Question:   My clinical findings support the need for the above services    Answer:   Unsafe ambulation due to balance issues    Order Specific Question:   My clinical findings support the need for the above services    Answer:   Unable to leave home safely without assistance and/or assistive device    Order Specific Question:   My clinical findings support the need for the above services    Answer:   Pain interferes with ambulation/mobility    Order Specific Question:   Further, I certify that my clinical findings support that this patient is homebound due to:    Answer:   Unsafe ambulation due to balance issues    Order Specific  Question:   Further, I certify that my clinical findings support that this patient is homebound due to:    Answer:   Pain interferes with ambulation/mobility    Order Specific Question:   Further, I certify that my clinical findings support that this patient is homebound due to:    Answer:   Unable to leave home safely without assistance  . POCT glycosylated hemoglobin (Hb A1C)   No orders of the defined types were placed in this encounter.  Orpah Cobb, DO PGY-3, [facility] 03/29/2020 1:52 PM

## 2020-03-29 ENCOUNTER — Other Ambulatory Visit: Payer: Self-pay

## 2020-03-29 ENCOUNTER — Encounter: Payer: Medicare Other | Admitting: Physical Medicine and Rehabilitation

## 2020-03-29 ENCOUNTER — Ambulatory Visit (INDEPENDENT_AMBULATORY_CARE_PROVIDER_SITE_OTHER): Payer: Medicare Other | Admitting: Family Medicine

## 2020-03-29 VITALS — BP 95/80 | HR 79 | Ht 76.0 in | Wt 180.2 lb

## 2020-03-29 DIAGNOSIS — E1122 Type 2 diabetes mellitus with diabetic chronic kidney disease: Secondary | ICD-10-CM

## 2020-03-29 DIAGNOSIS — R296 Repeated falls: Secondary | ICD-10-CM | POA: Diagnosis not present

## 2020-03-29 DIAGNOSIS — R634 Abnormal weight loss: Secondary | ICD-10-CM | POA: Diagnosis not present

## 2020-03-29 DIAGNOSIS — N183 Chronic kidney disease, stage 3 unspecified: Secondary | ICD-10-CM | POA: Diagnosis not present

## 2020-03-29 DIAGNOSIS — M6281 Muscle weakness (generalized): Secondary | ICD-10-CM

## 2020-03-29 DIAGNOSIS — E119 Type 2 diabetes mellitus without complications: Secondary | ICD-10-CM

## 2020-03-29 DIAGNOSIS — F0150 Vascular dementia without behavioral disturbance: Secondary | ICD-10-CM

## 2020-03-29 LAB — POCT GLYCOSYLATED HEMOGLOBIN (HGB A1C): Hemoglobin A1C: 5.7 % — AB (ref 4.0–5.6)

## 2020-03-29 NOTE — Assessment & Plan Note (Addendum)
Chronic and gradually worsening.  Suspect constitution of memory changes, weakness, more frequent falls, and weight loss are a progression of his dementia and health decline. Discussed this with daughter. She does note he has a living will and states that he desires comfort over aggressive treatment.  Discussed that he would benefit from PT/OT to help improve number of falls as well as bed side commode. DME order for bedside commode, wheelchair and Home health PT/OT/aid ordered.  Discussed that if he continues to decline that wife/daughter may become incapable of carrying for him at home. Plan to further discuss at follow up visit.  Would also likely benefit from medication review to minimize number of unneeded medications that could be contributing to his confusion and falls. His blood pressure is soft as well and would likely benefit from adjustment there as well.  Will obtain basic labs (CBC w/ diff, B12, TSH, CMP, Vit D) to evaluate and have patient follow up in 2 weeks to further discuss. Considered more aggressive imaging however given image findings in past and no sudden acute changes do not feel it would change management at this time.

## 2020-03-29 NOTE — Patient Instructions (Signed)
I am afraid this may be a natural progression of his dementia. I am getting basic lab work. I would like to follow up on 04/12/20 at 9:45am to further discuss. I am placing orders for a wheel chair, bedside commode, and home health PT/OT/and aid.

## 2020-03-31 LAB — COMPREHENSIVE METABOLIC PANEL
ALT: 18 IU/L (ref 0–44)
AST: 31 IU/L (ref 0–40)
Albumin/Globulin Ratio: 1.2 (ref 1.2–2.2)
Albumin: 4 g/dL (ref 3.7–4.7)
Alkaline Phosphatase: 113 IU/L (ref 44–121)
BUN/Creatinine Ratio: 10 (ref 10–24)
BUN: 18 mg/dL (ref 8–27)
Bilirubin Total: 0.5 mg/dL (ref 0.0–1.2)
CO2: 15 mmol/L — ABNORMAL LOW (ref 20–29)
Calcium: 9 mg/dL (ref 8.6–10.2)
Chloride: 98 mmol/L (ref 96–106)
Creatinine, Ser: 1.77 mg/dL — ABNORMAL HIGH (ref 0.76–1.27)
Globulin, Total: 3.3 g/dL (ref 1.5–4.5)
Glucose: 125 mg/dL — ABNORMAL HIGH (ref 65–99)
Potassium: 4.1 mmol/L (ref 3.5–5.2)
Sodium: 138 mmol/L (ref 134–144)
Total Protein: 7.3 g/dL (ref 6.0–8.5)
eGFR: 40 mL/min/{1.73_m2} — ABNORMAL LOW (ref >59)

## 2020-03-31 LAB — CBC WITH DIFFERENTIAL/PLATELET
Basophils Absolute: 0 10*3/uL (ref 0.0–0.2)
Basos: 0 %
EOS (ABSOLUTE): 0 10*3/uL (ref 0.0–0.4)
Eos: 0 %
Hematocrit: 31.4 % — ABNORMAL LOW (ref 37.5–51.0)
Hemoglobin: 10.5 g/dL — ABNORMAL LOW (ref 13.0–17.7)
Immature Grans (Abs): 0 10*3/uL (ref 0.0–0.1)
Immature Granulocytes: 1 %
Lymphocytes Absolute: 1.2 10*3/uL (ref 0.7–3.1)
Lymphs: 22 %
MCH: 29.3 pg (ref 26.6–33.0)
MCHC: 33.4 g/dL (ref 31.5–35.7)
MCV: 88 fL (ref 79–97)
Monocytes Absolute: 0.3 10*3/uL (ref 0.1–0.9)
Monocytes: 5 %
Neutrophils Absolute: 3.9 10*3/uL (ref 1.4–7.0)
Neutrophils: 72 %
Platelets: 129 10*3/uL — ABNORMAL LOW (ref 150–450)
RBC: 3.58 x10E6/uL — ABNORMAL LOW (ref 4.14–5.80)
RDW: 14.7 % (ref 11.6–15.4)
WBC: 5.5 10*3/uL (ref 3.4–10.8)

## 2020-03-31 LAB — VITAMIN D 25 HYDROXY (VIT D DEFICIENCY, FRACTURES): Vit D, 25-Hydroxy: 39.7 ng/mL (ref 30.0–100.0)

## 2020-03-31 LAB — VITAMIN B12

## 2020-03-31 LAB — TSH: TSH: 3.7 u[IU]/mL (ref 0.450–4.500)

## 2020-04-05 ENCOUNTER — Emergency Department (HOSPITAL_COMMUNITY)
Admission: EM | Admit: 2020-04-05 | Discharge: 2020-04-06 | Disposition: A | Discharge status: Home / Self Care | Payer: Medicare Other | Attending: Emergency Medicine | Admitting: Emergency Medicine

## 2020-04-05 ENCOUNTER — Telehealth: Payer: Self-pay

## 2020-04-05 ENCOUNTER — Emergency Department (HOSPITAL_COMMUNITY): Payer: Medicare Other

## 2020-04-05 ENCOUNTER — Other Ambulatory Visit: Payer: Self-pay

## 2020-04-05 ENCOUNTER — Other Ambulatory Visit: Payer: Self-pay | Admitting: *Deleted

## 2020-04-05 DIAGNOSIS — E1122 Type 2 diabetes mellitus with diabetic chronic kidney disease: Secondary | ICD-10-CM | POA: Diagnosis not present

## 2020-04-05 DIAGNOSIS — E871 Hypo-osmolality and hyponatremia: Secondary | ICD-10-CM

## 2020-04-05 DIAGNOSIS — Z23 Encounter for immunization: Secondary | ICD-10-CM | POA: Diagnosis not present

## 2020-04-05 DIAGNOSIS — Z79899 Other long term (current) drug therapy: Secondary | ICD-10-CM | POA: Insufficient documentation

## 2020-04-05 DIAGNOSIS — Z87891 Personal history of nicotine dependence: Secondary | ICD-10-CM | POA: Insufficient documentation

## 2020-04-05 DIAGNOSIS — Z20822 Contact with and (suspected) exposure to covid-19: Secondary | ICD-10-CM | POA: Diagnosis not present

## 2020-04-05 DIAGNOSIS — I10 Essential (primary) hypertension: Secondary | ICD-10-CM | POA: Diagnosis not present

## 2020-04-05 DIAGNOSIS — G319 Degenerative disease of nervous system, unspecified: Secondary | ICD-10-CM | POA: Diagnosis not present

## 2020-04-05 DIAGNOSIS — S0990XA Unspecified injury of head, initial encounter: Secondary | ICD-10-CM | POA: Diagnosis not present

## 2020-04-05 DIAGNOSIS — R4020 Unspecified coma: Secondary | ICD-10-CM | POA: Diagnosis not present

## 2020-04-05 DIAGNOSIS — R262 Difficulty in walking, not elsewhere classified: Secondary | ICD-10-CM | POA: Diagnosis not present

## 2020-04-05 DIAGNOSIS — M6281 Muscle weakness (generalized): Secondary | ICD-10-CM | POA: Diagnosis not present

## 2020-04-05 DIAGNOSIS — Z7901 Long term (current) use of anticoagulants: Secondary | ICD-10-CM | POA: Insufficient documentation

## 2020-04-05 DIAGNOSIS — R112 Nausea with vomiting, unspecified: Secondary | ICD-10-CM

## 2020-04-05 DIAGNOSIS — R001 Bradycardia, unspecified: Secondary | ICD-10-CM | POA: Diagnosis not present

## 2020-04-05 DIAGNOSIS — R2681 Unsteadiness on feet: Secondary | ICD-10-CM | POA: Insufficient documentation

## 2020-04-05 DIAGNOSIS — N183 Chronic kidney disease, stage 3 unspecified: Secondary | ICD-10-CM | POA: Diagnosis not present

## 2020-04-05 DIAGNOSIS — F0150 Vascular dementia without behavioral disturbance: Secondary | ICD-10-CM

## 2020-04-05 DIAGNOSIS — I6523 Occlusion and stenosis of bilateral carotid arteries: Secondary | ICD-10-CM | POA: Diagnosis not present

## 2020-04-05 DIAGNOSIS — R197 Diarrhea, unspecified: Secondary | ICD-10-CM | POA: Diagnosis not present

## 2020-04-05 DIAGNOSIS — I2510 Atherosclerotic heart disease of native coronary artery without angina pectoris: Secondary | ICD-10-CM | POA: Insufficient documentation

## 2020-04-05 DIAGNOSIS — Z8673 Personal history of transient ischemic attack (TIA), and cerebral infarction without residual deficits: Secondary | ICD-10-CM

## 2020-04-05 DIAGNOSIS — R531 Weakness: Secondary | ICD-10-CM

## 2020-04-05 DIAGNOSIS — I129 Hypertensive chronic kidney disease with stage 1 through stage 4 chronic kidney disease, or unspecified chronic kidney disease: Secondary | ICD-10-CM | POA: Diagnosis not present

## 2020-04-05 DIAGNOSIS — G9389 Other specified disorders of brain: Secondary | ICD-10-CM | POA: Diagnosis not present

## 2020-04-05 LAB — COMPREHENSIVE METABOLIC PANEL
ALT: 31 U/L (ref 0–44)
AST: 42 U/L — ABNORMAL HIGH (ref 15–41)
Albumin: 3.7 g/dL (ref 3.5–5.0)
Alkaline Phosphatase: 94 U/L (ref 38–126)
Anion gap: 10 (ref 5–15)
BUN: 23 mg/dL (ref 8–23)
CO2: 28 mmol/L (ref 22–32)
Calcium: 9 mg/dL (ref 8.9–10.3)
Chloride: 97 mmol/L — ABNORMAL LOW (ref 98–111)
Creatinine, Ser: 1.4 mg/dL — ABNORMAL HIGH (ref 0.61–1.24)
GFR, Estimated: 53 mL/min — ABNORMAL LOW (ref >60)
Glucose, Bld: 86 mg/dL (ref 70–99)
Potassium: 3.1 mmol/L — ABNORMAL LOW (ref 3.5–5.1)
Sodium: 135 mmol/L (ref 135–145)
Total Bilirubin: 1 mg/dL (ref 0.3–1.2)
Total Protein: 7.6 g/dL (ref 6.5–8.1)

## 2020-04-05 LAB — RESP PANEL BY RT-PCR (FLU A&B, COVID) ARPGX2
Influenza A by PCR: NEGATIVE
Influenza B by PCR: NEGATIVE
SARS Coronavirus 2 by RT PCR: NEGATIVE

## 2020-04-05 LAB — LIPASE, BLOOD: Lipase: 61 U/L — ABNORMAL HIGH (ref 11–51)

## 2020-04-05 LAB — CBC WITH DIFFERENTIAL/PLATELET
Abs Immature Granulocytes: 0.02 10*3/uL (ref 0.00–0.07)
Basophils Absolute: 0 10*3/uL (ref 0.0–0.1)
Basophils Relative: 0 %
Eosinophils Absolute: 0 10*3/uL (ref 0.0–0.5)
Eosinophils Relative: 1 %
HCT: 36 % — ABNORMAL LOW (ref 39.0–52.0)
Hemoglobin: 11.6 g/dL — ABNORMAL LOW (ref 13.0–17.0)
Immature Granulocytes: 1 %
Lymphocytes Relative: 27 %
Lymphs Abs: 1.2 10*3/uL (ref 0.7–4.0)
MCH: 29.1 pg (ref 26.0–34.0)
MCHC: 32.2 g/dL (ref 30.0–36.0)
MCV: 90.5 fL (ref 80.0–100.0)
Monocytes Absolute: 0.3 10*3/uL (ref 0.1–1.0)
Monocytes Relative: 7 %
Neutro Abs: 2.9 10*3/uL (ref 1.7–7.7)
Neutrophils Relative %: 64 %
Platelets: 167 10*3/uL (ref 150–400)
RBC: 3.98 MIL/uL — ABNORMAL LOW (ref 4.22–5.81)
RDW: 14.9 % (ref 11.5–15.5)
WBC: 4.4 10*3/uL (ref 4.0–10.5)
nRBC: 0 % (ref 0.0–0.2)

## 2020-04-05 LAB — URINALYSIS, ROUTINE W REFLEX MICROSCOPIC
Bacteria, UA: NONE SEEN
Bilirubin Urine: NEGATIVE
Glucose, UA: NEGATIVE mg/dL
Ketones, ur: 20 mg/dL — AB
Nitrite: NEGATIVE
Protein, ur: 100 mg/dL — AB
RBC / HPF: >50 RBC/hpf — ABNORMAL HIGH (ref 0–5)
Specific Gravity, Urine: 1.023 (ref 1.005–1.030)
WBC, UA: >50 WBC/hpf — ABNORMAL HIGH (ref 0–5)
pH: 5 (ref 5.0–8.0)

## 2020-04-05 LAB — POC OCCULT BLOOD, ED: Fecal Occult Bld: POSITIVE — AB

## 2020-04-05 MED ORDER — DULOXETINE HCL 30 MG PO CPEP
30.0000 mg | ORAL_CAPSULE | Freq: Every day | ORAL | Status: DC
  Administered 2020-04-05: 30 mg via ORAL
  Filled 2020-04-05: qty 1

## 2020-04-05 MED ORDER — POTASSIUM CHLORIDE CRYS ER 20 MEQ PO TBCR
40.0000 meq | EXTENDED_RELEASE_TABLET | Freq: Once | ORAL | Status: AC
  Administered 2020-04-05: 40 meq via ORAL
  Filled 2020-04-05: qty 2

## 2020-04-05 MED ORDER — TAMSULOSIN HCL 0.4 MG PO CAPS
0.4000 mg | ORAL_CAPSULE | Freq: Every day | ORAL | Status: DC
  Administered 2020-04-05 – 2020-04-06 (×2): 0.4 mg via ORAL
  Filled 2020-04-05 (×2): qty 1

## 2020-04-05 MED ORDER — SODIUM CHLORIDE 0.9 % IV BOLUS
500.0000 mL | Freq: Once | INTRAVENOUS | Status: AC
  Administered 2020-04-05: 500 mL via INTRAVENOUS

## 2020-04-05 MED ORDER — LISINOPRIL 10 MG PO TABS
10.0000 mg | ORAL_TABLET | Freq: Every day | ORAL | Status: DC
  Administered 2020-04-05: 10 mg via ORAL
  Filled 2020-04-05: qty 1

## 2020-04-05 MED ORDER — RIVAROXABAN 15 MG PO TABS
15.0000 mg | ORAL_TABLET | Freq: Every day | ORAL | Status: DC
  Administered 2020-04-05: 15 mg via ORAL
  Filled 2020-04-05 (×2): qty 1

## 2020-04-05 MED ORDER — FAMOTIDINE 20 MG PO TABS: 20.0000 mg | ORAL_TABLET | Freq: Every day | ORAL | 0 refills | Status: DC

## 2020-04-05 MED ORDER — POTASSIUM CHLORIDE ER 10 MEQ PO TBCR: 10.0000 meq | EXTENDED_RELEASE_TABLET | Freq: Every day | ORAL | 0 refills | Status: AC

## 2020-04-05 MED ORDER — ACETAMINOPHEN 325 MG PO TABS
650.0000 mg | ORAL_TABLET | Freq: Four times a day (QID) | ORAL | Status: DC | PRN
  Administered 2020-04-05: 650 mg via ORAL
  Filled 2020-04-05: qty 2

## 2020-04-05 MED ORDER — NITROFURANTOIN MONOHYD MACRO 100 MG PO CAPS
100.0000 mg | ORAL_CAPSULE | Freq: Two times a day (BID) | ORAL | Status: DC
  Administered 2020-04-05 – 2020-04-06 (×2): 100 mg via ORAL
  Filled 2020-04-05 (×3): qty 1

## 2020-04-05 MED ORDER — PANCRELIPASE (LIP-PROT-AMYL) 12000-38000 UNITS PO CPEP
12000.0000 [IU] | ORAL_CAPSULE | Freq: Three times a day (TID) | ORAL | Status: DC
  Administered 2020-04-06 (×3): 12000 [IU] via ORAL
  Filled 2020-04-05 (×6): qty 1

## 2020-04-05 MED ORDER — HYDRALAZINE HCL 25 MG PO TABS
25.0000 mg | ORAL_TABLET | Freq: Once | ORAL | Status: AC
  Administered 2020-04-05: 25 mg via ORAL
  Filled 2020-04-05: qty 1

## 2020-04-05 MED ORDER — ONDANSETRON 4 MG PO TBDP
4.0000 mg | ORAL_TABLET | Freq: Once | ORAL | Status: AC
  Administered 2020-04-05: 4 mg via ORAL
  Filled 2020-04-05: qty 1

## 2020-04-05 MED ORDER — FINASTERIDE 5 MG PO TABS
5.0000 mg | ORAL_TABLET | Freq: Every day | ORAL | Status: DC
  Administered 2020-04-05: 5 mg via ORAL
  Filled 2020-04-05 (×2): qty 1

## 2020-04-05 NOTE — ED Provider Notes (Signed)
Received signout from previous provider, please see her note for complete H&P.  In short this is a 75 year old male with history of dementia who has had progressive generalized weakness, weight loss, difficulty caring for himself at home.  Also has trouble with nausea and vomiting and trouble eating.  He has had recurrent urinary tract infection with an indwelling Foley catheter.  Today work-up without any acute finding to require hospital admission however due to his current state, Child psychotherapist and physical therapy has evaluated patient and felt patient is appropriate for SNF placement.  Plan to have patient placed at [facility].  Will order home meds and will hold pt over night for placement.  Family member agrees with plan.    BP (!) 134/103   Pulse 62   Temp (!) 97.5 F (36.4 C) (Oral)   Resp 17   Ht 6\' 4"  (1.93 m)   Wt 81.6 kg   SpO2 99%   BMI 21.91 kg/m   Results for orders placed or performed during the hospital encounter of 04/05/20  CBC with Differential  Result Value Ref Range   WBC 4.4 4.0 - 10.5 K/uL   RBC 3.98 (L) 4.22 - 5.81 MIL/uL   Hemoglobin 11.6 (L) 13.0 - 17.0 g/dL   HCT 06/05/20 (L) 16.1 - 09.6 %   MCV 90.5 80.0 - 100.0 fL   MCH 29.1 26.0 - 34.0 pg   MCHC 32.2 30.0 - 36.0 g/dL   RDW 04.5 40.9 - 81.1 %   Platelets 167 150 - 400 K/uL   nRBC 0.0 0.0 - 0.2 %   Neutrophils Relative % 64 %   Neutro Abs 2.9 1.7 - 7.7 K/uL   Lymphocytes Relative 27 %   Lymphs Abs 1.2 0.7 - 4.0 K/uL   Monocytes Relative 7 %   Monocytes Absolute 0.3 0.1 - 1.0 K/uL   Eosinophils Relative 1 %   Eosinophils Absolute 0.0 0.0 - 0.5 K/uL   Basophils Relative 0 %   Basophils Absolute 0.0 0.0 - 0.1 K/uL   Immature Granulocytes 1 %   Abs Immature Granulocytes 0.02 0.00 - 0.07 K/uL  Comprehensive metabolic panel  Result Value Ref Range   Sodium 135 135 - 145 mmol/L   Potassium 3.1 (L) 3.5 - 5.1 mmol/L   Chloride 97 (L) 98 - 111 mmol/L   CO2 28 22 - 32 mmol/L   Glucose, Bld 86 70 -  99 mg/dL   BUN 23 8 - 23 mg/dL   Creatinine, Ser 91.4 (H) 0.61 - 1.24 mg/dL   Calcium 9.0 8.9 - 7.82 mg/dL   Total Protein 7.6 6.5 - 8.1 g/dL   Albumin 3.7 3.5 - 5.0 g/dL   AST 42 (H) 15 - 41 U/L   ALT 31 0 - 44 U/L   Alkaline Phosphatase 94 38 - 126 U/L   Total Bilirubin 1.0 0.3 - 1.2 mg/dL   GFR, Estimated 53 (L) >60 mL/min   Anion gap 10 5 - 15  Lipase, blood  Result Value Ref Range   Lipase 61 (H) 11 - 51 U/L  Urinalysis, Routine w reflex microscopic Urine, Catheterized  Result Value Ref Range   Color, Urine AMBER (A) YELLOW   APPearance HAZY (A) CLEAR   Specific Gravity, Urine 1.023 1.005 - 1.030   pH 5.0 5.0 - 8.0   Glucose, UA NEGATIVE NEGATIVE mg/dL   Hgb urine dipstick LARGE (A) NEGATIVE   Bilirubin Urine NEGATIVE NEGATIVE   Ketones, ur 20 (A) NEGATIVE  mg/dL   Protein, ur 027 (A) NEGATIVE mg/dL   Nitrite NEGATIVE NEGATIVE   Leukocytes,Ua MODERATE (A) NEGATIVE   RBC / HPF >50 (H) 0 - 5 RBC/hpf   WBC, UA >50 (H) 0 - 5 WBC/hpf   Bacteria, UA NONE SEEN NONE SEEN   Squamous Epithelial / LPF 0-5 0 - 5   Mucus PRESENT    Budding Yeast PRESENT   POC occult blood, ED Provider will collect  Result Value Ref Range   Fecal Occult Bld POSITIVE (A) NEGATIVE   DG Chest 2 View  Result Date: 04/05/2020 CLINICAL DATA:  Weakness EXAM: CHEST - 2 VIEW COMPARISON:  05/25/2019 FINDINGS: The heart size and mediastinal contours are within normal limits. Atherosclerotic calcification of the aortic knob. Both lungs are clear. The visualized skeletal structures are unremarkable. IMPRESSION: No active cardiopulmonary disease. Electronically Signed   By: Duanne Guess D.O.   On: 04/05/2020 13:21   CT Head Wo Contrast  Result Date: 04/05/2020 CLINICAL DATA:  Head trauma EXAM: CT HEAD WITHOUT CONTRAST TECHNIQUE: Contiguous axial images were obtained from the base of the skull through the vertex without intravenous contrast. COMPARISON:  CT head 05/25/2019 FINDINGS: Brain: Moderate atrophy.  Moderate white matter hypodensity diffusely is unchanged. Hypodensity internal capsule bilaterally compatible with chronic ischemia, unchanged. Ventricle size enlarged due to atrophy. No hydrocephalus. Vascular: Negative for hyperdense vessel. Extensive atherosclerotic calcification in the carotid and vertebral arteries bilaterally. Skull: Negative Sinuses/Orbits: Paranasal sinuses clear. Bilateral cataract extraction Other: None IMPRESSION: No acute abnormality no change from the prior study Extensive intracranial atherosclerotic disease. Atrophy and moderate chronic microvascular ischemic change Electronically Signed   By: Marlan Palau M.D.   On: 04/05/2020 13:20   US Abdomen Limited RUQ (LIVER/GB)  Result Date: 04/05/2020 CLINICAL DATA:  Nausea and vomiting. EXAM: ULTRASOUND ABDOMEN LIMITED RIGHT UPPER QUADRANT COMPARISON:  None. FINDINGS: Gallbladder: A mild amount of heterogeneous, echogenic sludge is seen within the gallbladder lumen. No gallstones are visualized. The gallbladder wall measures 3.3 mm in thickness. No sonographic Murphy sign noted by sonographer. Common bile duct: Diameter: 3.9 mm Liver: No focal lesion identified. Within normal limits in parenchymal echogenicity. Portal vein is patent on color Doppler imaging with normal direction of blood flow towards the liver. Other: The study is limited secondary to the patient's body habitus and overlying bowel gas. IMPRESSION: Gallbladder sludge, without evidence of cholelithiasis or acute cholecystitis. Electronically Signed   By: Aram Candela M.D.   On: 04/05/2020 15:31      Fayrene Helper, PA-C 04/05/20 1819    Pollyann Savoy, MD 04/05/20 2011

## 2020-04-05 NOTE — Progress Notes (Signed)
Patient suffers from  Weakness of right arm and leg with fall which impairs their ability to perform daily activities like ambulation  in the home.  A walker alone will not resolve the issues with performing activities of daily living. A wheelchair will allow patient to safely perform daily activities.  The patient can self propel in the home or has a caregiver who can provide assistance.   Blanchard Kelch PT Acute Rehabilitation Services Pager [phone] Office [phone]

## 2020-04-05 NOTE — Discharge Instructions (Addendum)
Take Pepcid daily to decrease stomach acid and hopefully help with your nausea and vomiting. Take potassium daily for the next week. Follow-up with your primary care doctor for recheck of your blood work including potassium and hemoglobin. Return to the emergency room if you develop fevers, persistent vomiting, cough and shortness of breath, severe abdominal pain, or any new, sudden, or concerning symptoms.

## 2020-04-05 NOTE — [place] FL2 (Addendum)
[place] MEDICAID FL2 LEVEL OF CARE SCREENING TOOL     IDENTIFICATION  Patient Name: Brian Burns Birthdate: 1945/02/20 Sex: male Admission Date (Current Location): 04/05/2020  [facility] and [place] Number:  Producer, television/film/video and Address:  [facility],  [address] [place], [place] [address]      Provider Number: [id number]  Attending Physician Name and Address:  Pollyann Savoy, MD  Relative Name and Phone Number:  [name] # [phone]    Current Level of Care: Hospital Recommended Level of Care: Skilled Nursing Facility Prior Approval Number:    Date Approved/Denied:   PASRR Number: [id number]  Discharge Plan: SNF    Current Diagnoses: Patient Active Problem List   Diagnosis Date Noted  . Abnormality of gait 09/27/2019  . Seizure (HCC)   . Urge incontinence 12/08/2018  . Stenosis of lateral recess of lumbar spine 12/08/2018  . Dementia, vascular (HCC) 12/08/2018  . Decreased visual acuity of left eye  12/02/2018  . Gross hematuria 10/29/2018  . Other abnormalities of gait and mobility 09/02/2018  . Muscle weakness (generalized) 04/08/2018  . Lumbar spinal stenosis 04/05/2018  . Chronic pain of right knee 03/07/2017  . Chronic low back pain without sciatica 03/07/2017  . Nonischemic cardiomyopathy (HCC) 09/25/2016  . Microalbuminuria 07/26/2015  . Paroxysmal atrial fibrillation (HCC)   . Coronary artery disease involving native coronary artery of native heart without angina pectoris   . Stricture, urethra   . CKD stage 3 due to type 2 diabetes mellitus (HCC) 03/16/2015  . Depression 03/16/2015  . Normocytic anemia   . History of Multiple lacunar infarcts (HCC) 01/31/2015  . Weight loss 03/18/2014  . GERD (gastroesophageal reflux disease) 10/21/2013  . AAA (abdominal aortic aneurysm) without rupture (HCC) 06/24/2013  . PVD (peripheral vascular disease) (HCC) 06/24/2013  . Orthostatic dizziness 03/18/2013  . Hearing loss of both ears  03/18/2013  . BPH (benign prostatic hyperplasia) 02/16/2013  . Type 2 diabetes mellitus with diabetic neuropathy, unspecified (HCC) 05/10/2012  . Type 2 diabetes mellitus with hypercholesterolemia (HCC) 02/05/2007  . Hypertension associated with diabetes (HCC) 03/27/2006    Orientation RESPIRATION BLADDER Height & Weight     Self,Time,Situation,Place  Normal Incontinent Weight: 81.6 kg Height:  6\' 4"  (193 cm)  BEHAVIORAL SYMPTOMS/MOOD NEUROLOGICAL BOWEL NUTRITION STATUS      Incontinent Diet (regular)  AMBULATORY STATUS COMMUNICATION OF NEEDS Skin   Extensive Assist Verbally Normal                       Personal Care Assistance Level of Assistance  Bathing,Feeding,Dressing Bathing Assistance: Limited assistance Feeding assistance: Independent Dressing Assistance: Limited assistance     Functional Limitations Info  Sight,Hearing,Speech Sight Info: Adequate Hearing Info: Adequate Speech Info: Impaired (stutters)    SPECIAL CARE FACTORS FREQUENCY  PT (By licensed PT),OT (By licensed OT)     PT Frequency: 5x per week OT Frequency: 5x per week            Contractures Contractures Info: Not present    Additional Factors Info  Code Status,Allergies Code Status Info: Full Code Allergies Info: Gabapentin           Current Medications (04/05/2020):  This is the current hospital active medication list Current Facility-Administered Medications  Medication Dose Route Frequency Provider Last Rate Last Admin  . potassium chloride SA (KLOR-CON) CR tablet 40 mEq  40 mEq Oral Once 06/05/2020, PA-C  Current Outpatient Medications  Medication Sig Dispense Refill  . acetaminophen (TYLENOL) 325 MG tablet Take 325 mg by mouth every 6 (six) hours as needed for moderate pain.    Marland Kitchen atorvastatin (LIPITOR) 40 MG tablet Take 1 tablet (40 mg total) by mouth daily. 90 tablet 2  . Cholecalciferol (VITAMIN D) 50 MCG (2000 UT) tablet Take 2,000 Units by mouth daily.    .  citalopram (CELEXA) 40 MG tablet Take 40 mg by mouth daily.    . DULoxetine (CYMBALTA) 30 MG capsule Take 30 mg by mouth at bedtime.    . finasteride (PROSCAR) 5 MG tablet Take 1 tablet (5 mg total) by mouth at bedtime. 30 tablet 0  . lipase/protease/amylase (CREON) 12000-38000 units CPEP capsule TAKE 1 CAPSULE BY MOUTH THREE TIMES DAILY WITH MEALS 270 capsule 1  . lisinopril (ZESTRIL) 10 MG tablet Take 10 mg by mouth at bedtime.    . nitrofurantoin, macrocrystal-monohydrate, (MACROBID) 100 MG capsule Take 100 mg by mouth 2 (two) times daily.    . tamsulosin (FLOMAX) 0.4 MG CAPS capsule TAKE 1 CAPSULE(0.4 MG) BY MOUTH DAILY (Patient taking differently: Take 0.4 mg by mouth daily.) 30 capsule 0  . traMADol (ULTRAM) 50 MG tablet Take 1 tablet (50 mg total) by mouth 3 (three) times daily as needed for moderate pain. 90 tablet 5  . XARELTO 15 MG TABS tablet TAKE 1 TABLET(15 MG) BY MOUTH TWICE DAILY WITH A MEAL (Patient taking differently: Take 15 mg by mouth at bedtime.) 90 tablet 2  . DULoxetine (CYMBALTA) 60 MG capsule Take 1 capsule (60 mg total) by mouth at bedtime. For back pain/nerve pain (Patient not taking: No sig reported) 30 capsule 5  . ferrous sulfate 324 MG TBEC Take 1 tablet (324 mg total) by mouth daily with breakfast. (Patient not taking: No sig reported) 30 tablet 0  . lisinopril (ZESTRIL) 5 MG tablet Take 1 tablet (5 mg total) by mouth at bedtime. (Patient not taking: No sig reported) 90 tablet 3     Discharge Medications: Please see discharge summary for a list of discharge medications.  Relevant Imaging Results:  Relevant Lab Results:   Additional Information SSN [id number]  Elliot Cousin, RN

## 2020-04-05 NOTE — Evaluation (Signed)
Physical Therapy Evaluation Patient Details Name: Brian Burns MRN: [id number] DOB: 23-Nov-1945 Today's Date: 04/05/2020   History of Present Illness  75 YO male brought to ED 04/05/20  with weakness, unable to ambulate to get to urology appointment. H/O BPH, UTI,    afib,  stroke. depression; indwelling catheter.  Clinical Impression  The patient received, resting in bed , daughter at bedside. Patient requires  Mod asisstance for bed mobility. Requires max assistance to take a few small steps, right leg  Noted buckling, Patient  Holding RW.  Daughter reports decline in functional mobilty recently.Patient very pleasant and able to participate in  Mobility. Pt admitted with above diagnosis.  Pt currently with functional limitations due to the deficits listed below (see PT Problem List). Pt will benefit from skilled PT to increase their independence and safety with mobility to allow discharge to the venue listed below.       Follow Up Recommendations SNF    Equipment Recommendations  Wheelchair cushion (measurements PT);Wheelchair (measurements PT)    Recommendations for Other Services       Precautions / Restrictions Precautions Precautions: Fall Precaution Comments: has catheter      Mobility  Bed Mobility Overal bed mobility: Needs Assistance Bed Mobility: Supine to Sit;Sit to Supine     Supine to sit: Mod assist Sit to supine: Max assist   General bed mobility comments: multimoal cues for moving legs toward edge, rquired assistanc e to completely go over edge and assist with trunk. patient  mqade much effort to place legs back onto stretcher but did require mod assistance to complete.    Transfers Overall transfer level: Needs assistance Equipment used: Rolling walker (2 wheeled) Transfers: Sit to/from Stand Sit to Stand: Mod assist         General transfer comment: mod assist to rise from stretcher x 2 , holding RW. Took 5 side steps, R knee noted to buckle each  step.Required max assistanc e for stability standing.  Ambulation/Gait             General Gait Details: TBA  Stairs            Wheelchair Mobility    Modified Rankin (Stroke Patients Only)       Balance Overall balance assessment: Needs assistance;History of Falls Sitting-balance support: Bilateral upper extremity supported;Feet supported Sitting balance-Leahy Scale: Fair Sitting balance - Comments: sits at midline   Standing balance support: Bilateral upper extremity supported;During functional activity Standing balance-Leahy Scale: Poor Standing balance comment: listing to ther right, requires UE suport                             Pertinent Vitals/Pain Pain Assessment: No/denies pain    Home Living Family/patient expects to be discharged to:: Private residence Living Arrangements: Spouse/significant other;Children Available Help at Discharge: Family;Available 24 hours/day Type of Home: House Home Access: Stairs to enter Entrance Stairs-Rails: Right;Left;Can reach both Entrance Stairs-Number of Steps: 4 Home Layout: One level Home Equipment: Tub bench;Walker - 4 wheels;Cane - single point;Walker - 2 wheels Additional Comments: Wife unable to physically assist. Daughter  there, out of work with "back injury" so somewhat limited    Prior Function           Comments: per daughter, recently has not been able to ambulate in home, using BSC.     Hand Dominance   Dominant Hand: Right    Extremity/Trunk Assessment   Upper Extremity  Assessment Upper Extremity Assessment: LUE deficits/detail RUE Deficits / Details: shoulder flex to 80, weak grip LUE Deficits / Details: WFL    Lower Extremity Assessment Lower Extremity Assessment: RLE deficits/detail;LLE deficits/detail RLE Deficits / Details: grossly 3/5 LLE Deficits / Details: 4/5    Cervical / Trunk Assessment Cervical / Trunk Assessment: Other exceptions  Communication    Communication: Expressive difficulties (speech is slurred)  Cognition Arousal/Alertness: Awake/alert Behavior During Therapy: WFL for tasks assessed/performed;Flat affect Overall Cognitive Status: History of cognitive impairments - at baseline Area of Impairment: Orientation;Following commands;Awareness                 Orientation Level: Time;Situation     Following Commands: Follows one step commands consistently;Follows one step commands with increased time   Awareness: Emergent   General Comments: Follows basic commands, able to participate in directions for mobilty      General Comments      Exercises     Assessment/Plan    PT Assessment Patient needs continued PT services  PT Problem List Decreased strength;Decreased mobility;Decreased safety awareness;Decreased coordination;Decreased range of motion;Decreased knowledge of precautions;Decreased activity tolerance;Decreased cognition;Decreased balance;Decreased knowledge of use of DME       PT Treatment Interventions DME instruction;Therapeutic activities;Gait training;Therapeutic exercise;Patient/family education;Functional mobility training;Wheelchair mobility training    PT Goals (Current goals can be found in the Care Plan section)  Acute Rehab PT Goals Patient Stated Goal: for him to get stonger PT Goal Formulation: With patient/family Time For Goal Achievement: 04/19/20 Potential to Achieve Goals: Fair    Frequency Min 2X/week   Barriers to discharge        Co-evaluation               AM-PAC PT "6 Clicks" Mobility  Outcome Measure Help needed turning from your back to your side while in a flat bed without using bedrails?: A Lot Help needed moving from lying on your back to sitting on the side of a flat bed without using bedrails?: A Lot Help needed moving to and from a bed to a chair (including a wheelchair)?: A Lot Help needed standing up from a chair using your arms (e.g., wheelchair or  bedside chair)?: A Lot Help needed to walk in hospital room?: Total Help needed climbing 3-5 steps with a railing? : Total 6 Click Score: 10    End of Session Equipment Utilized During Treatment: Gait belt Activity Tolerance: Patient tolerated treatment well;Patient limited by fatigue Patient left: in bed;with call bell/phone within reach;with family/visitor present Nurse Communication: Mobility status PT Visit Diagnosis: Unsteadiness on feet (R26.81);Difficulty in walking, not elsewhere classified (R26.2);Muscle weakness (generalized) (M62.81);Hemiplegia and hemiparesis Hemiplegia - Right/Left: Right Hemiplegia - dominant/non-dominant: Dominant Hemiplegia - caused by: Cerebral infarction    Time: 1352-1420 PT Time Calculation (min) (ACUTE ONLY): 28 min   Charges:   PT Evaluation $PT Eval Low Complexity: 1 Low PT Treatments $Therapeutic Activity: 8-22 mins        Blanchard Kelch PT Acute Rehabilitation Services Pager [phone] Office [phone]   Rada Hay 04/05/2020, 2:42 PM

## 2020-04-05 NOTE — TOC Progression Note (Addendum)
Transition of Care [facility]) - Progression Note    Patient Details  Name: Brian Burns MRN: [id number] Date of Birth: 30-Mar-1945  [facility] CM/SW Contact  Elliot Cousin, RN Phone Number: [phone] 04/05/2020, 4:37 PM  Clinical Narrative:     TOC CM spoke to dtr and requesting SNF rehab. Gave permission to create FL2 ad fax referral to SNF rehab. [employer] delivered wheelchair to pt's room but pt's wheelchair will be delivered to his home per dtr's request. Her car is too small to transport. Dtr is requesting [facility] [employer]. Contacted Public relations account executive will give call back in am to verify they have bed. Pt has been vaccinated but they will need him to have booster or he will need to be quarantined.     Dtr is requesting assistance in the home. Explained [place] Medicaid [employer] that will provide aide in home to assist with ADL's. Pt's PCP, Dr Mauri Reading office will complete paperwork for personal care assistant to assist in home once he dc from SNF rehab. [employer] started and clinicals faxed. Reference # [id number].    Barriers to Discharge: Continued Medical Work up  Expected Discharge Plan and Services   Social Determinants of Health (SDOH) Interventions    Readmission Risk Interventions No flowsheet data found.

## 2020-04-05 NOTE — ED Triage Notes (Signed)
Pt came from home via EMS. Pt's daughter reports that pt has been weaker than normal for the past week. Pt had a UTI since January that has been treated with PO antibx since then. EMS called today because pt had an appt with urologist today, but pt's daughter deemed pt too weak to get up this morning. Pt has also exhibited loss of appetite for the past week. Pt at baseline otherwise. CBG: 104 146/94 65 bpm 20 RR 98% on RA

## 2020-04-05 NOTE — ED Provider Notes (Signed)
[facility]-EMERGENCY DEPT Provider Note   CSN: [id number] Arrival date & time: 04/05/20  1055     History Chief Complaint  Patient presents with  . Weakness  . Nausea    Brian Burns is a 75 y.o. male presenting for evaluation of weakness, n/v.   Level V caveat as pt has h/o dementia. Daughter assisted with history.   Daughter states patient has had gradually worsening weakness over the past several weeks.  He was supposed to go to a urology appointment to get his Foley catheter removed, but was unable to to do so due to weakness.  Daughter also reports that 40 pound weight loss in the past several months.  He has had once a day postprandial vomiting, but not every time he eats.  He lives with his wife at home, and is difficult for her to care for him.  His weakness is described as his legs feeling very weak, like there been a buckle under him.  He has some mild dizziness.  He has noticed his stools are darker than normal, but not black.  He did have constipation last week, PCP started him on laxative and now he has diarrhea.  He denies fevers, chills, cough, chest pain, shortness of breath, abdominal pain.  He is on blood thinners for A. fib.  He has had frequent falls, but none in the past week.  He has not hit his head during the fall.  Additional history obtained from chart review.  Patient with a history of AAA, anxiety, A. fib on anticoagulation, CAD, chronic pain, CKD, GERD, hypertension, history of stroke, dementia.   HPI     Past Medical History:  Diagnosis Date  . AAA (abdominal aortic aneurysm) without rupture (HCC) 06/2014   no change in last exam from 2015  . Achalasia   . Anxiety   . Arthritis    knees,lower back  . Atrial fibrillation (HCC)   . Atrial flutter with rapid ventricular response (HCC) 01/16/2015  . Bilateral wrist pain 09/09/2014  . Bladder outlet obstruction 01/20/2015  . Bradycardia 08/27/2016  . CAD (coronary artery disease)     pt denies  . Carotid artery disease (HCC)   . Chronic back pain   . Chronic kidney disease (CKD), stage III (moderate) (HCC)   . Decreased visual acuity of left eye  12/02/2018  . Depression   . Dyspnea    reports today no pregoression  . Erectile dysfunction   . GERD (gastroesophageal reflux disease)   . Headache    hx of  . History of CVA (cerebrovascular accident)    2009  &  2013  . History of Multiple lacunar infarcts (HCC) 01/31/2015   MRI Brain 01/31/2015: Widespread old lacunar infarcts, particularly right Basal ganglia, pons, and right cerebellum  . HLD (hyperlipidemia)   . Hypertension   . Idiopathic pancreatitis 12/2014   acute  . Knee pain, bilateral 04/09/2011  . Malnutrition of moderate degree 01/25/2015  . Memory impairment    mild dementia per PCP progress note   . Morbid obesity (HCC) 12/03/2018  . Other abnormalities of gait and mobility 12/03/2018  . PVD (peripheral vascular disease) with claudication (HCC)   . Urethral stricture 02/16/2013  . Urge incontinence 12/08/2018    Patient Active Problem List   Diagnosis Date Noted  . Abnormality of gait 09/27/2019  . Seizure (HCC)   . Urge incontinence 12/08/2018  . Stenosis of lateral recess of lumbar spine 12/08/2018  .  Dementia, vascular (HCC) 12/08/2018  . Decreased visual acuity of left eye  12/02/2018  . Gross hematuria 10/29/2018  . Other abnormalities of gait and mobility 09/02/2018  . Muscle weakness (generalized) 04/08/2018  . Lumbar spinal stenosis 04/05/2018  . Chronic pain of right knee 03/07/2017  . Chronic low back pain without sciatica 03/07/2017  . Nonischemic cardiomyopathy (HCC) 09/25/2016  . Microalbuminuria 07/26/2015  . Paroxysmal atrial fibrillation (HCC)   . Coronary artery disease involving native coronary artery of native heart without angina pectoris   . Stricture, urethra   . CKD stage 3 due to type 2 diabetes mellitus (HCC) 03/16/2015  . Depression 03/16/2015  . Normocytic anemia    . History of Multiple lacunar infarcts (HCC) 01/31/2015  . Weight loss 03/18/2014  . GERD (gastroesophageal reflux disease) 10/21/2013  . AAA (abdominal aortic aneurysm) without rupture (HCC) 06/24/2013  . PVD (peripheral vascular disease) (HCC) 06/24/2013  . Orthostatic dizziness 03/18/2013  . Hearing loss of both ears 03/18/2013  . BPH (benign prostatic hyperplasia) 02/16/2013  . Type 2 diabetes mellitus with diabetic neuropathy, unspecified (HCC) 05/10/2012  . Type 2 diabetes mellitus with hypercholesterolemia (HCC) 02/05/2007  . Hypertension associated with diabetes (HCC) 03/27/2006    Past Surgical History:  Procedure Laterality Date  . back injections     [facility]  . BALLOON DILATION N/A 12/16/2018   Procedure: URETHRAL BALLOON DILATION;  Surgeon: Crist Fat, MD;  Location: [facility];  Service: Urology;  Laterality: N/A;  . CARDIOVASCULAR STRESS TEST  08-09-2009   mild global hypokinesis,  no ischemia or scar/  ef 41%  . CATARACT EXTRACTION W/ INTRAOCULAR LENS  IMPLANT, BILATERAL  april and may 2014  . CYSTOSCOPY N/A 01/17/2017   Procedure: CYSTOSCOPY FLEXIBLE;  Surgeon: Crist Fat, MD;  Location: [facility];  Service: Urology;  Laterality: N/A;  . CYSTOSCOPY WITH RETROGRADE URETHROGRAM N/A 11/24/2014   Procedure: CYSTOSCOPY WITH RETROGRADE URETHROGRAM;  Surgeon: Crist Fat, MD;  Location: [facility];  Service: Urology;  Laterality: N/A;  . CYSTOSCOPY WITH RETROGRADE URETHROGRAM N/A 12/16/2018   Procedure: CYSTOSCOPY WITH RETROGRADE URETHROGRAM;  Surgeon: Crist Fat, MD;  Location: [facility];  Service: Urology;  Laterality: N/A;  . CYSTOSCOPY WITH RETROGRADE URETHROGRAM N/A 12/16/2019   Procedure: CYSTOSCOPY WITH RETROGRADE, [employer] Internal Urethrotomy;  Surgeon: Crist Fat, MD;  Location: [facility];  Service: Urology;  Laterality: N/A;  . CYSTOSCOPY WITH URETHRAL DILATATION N/A 11/24/2014   Procedure:  CYSTOSCOPY WITH URETHRAL DILATATION;  Surgeon: Crist Fat, MD;  Location: [facility];  Service: Urology;  Laterality: N/A;  BALLOON DILATION        . ESOPHAGOGASTRODUODENOSCOPY N/A 05/15/2014   Procedure: ESOPHAGOGASTRODUODENOSCOPY (EGD);  Surgeon: Carman Ching, MD;  Location: [facility];  Service: Endoscopy;  Laterality: N/A;  . ESOPHAGOGASTRODUODENOSCOPY Left 02/02/2015   Procedure: ESOPHAGOGASTRODUODENOSCOPY (EGD);  Surgeon: Dorena Cookey, MD;  Location: [facility];  Service: Endoscopy;  Laterality: Left;  . ESOPHAGOGASTRODUODENOSCOPY Left 03/16/2015   Procedure: ESOPHAGOGASTRODUODENOSCOPY (EGD);  Surgeon: Dorena Cookey, MD;  Location: [facility];  Service: Endoscopy;  Laterality: Left;  . TRANSTHORACIC ECHOCARDIOGRAM  01-07-2012   mild to moderate dilated LV,  ef 45-50%,  mild basal hypokinesis,  grade I diastolic  dysfunction/  trivial AR/  mild MR  . URETHROPLASTY N/A 01/17/2017   Procedure: URETHEROPLASTY BUCCAL GRAFT AND BUCCAL MUCOSA GRAFT HARVEST;  Surgeon: Crist Fat, MD;  Location: [facility];  Service: Urology;  Laterality: N/A;  Family History  Problem Relation Age of Onset  . Lung disease Brother   . Kidney disease Brother   . Heart disease Brother        died at 30  . Heart disease Mother        died at 6  . Kidney disease Mother   . Lung disease Mother     Social History   Tobacco Use  . Smoking status: Former Smoker    Packs/day: 0.50    Years: 40.00    Pack years: 20.00    Types: Cigarettes    Quit date: 01/29/1999    Years since quitting: 21.1  . Smokeless tobacco: Never Used  Vaping Use  . Vaping Use: Never used  Substance Use Topics  . Alcohol use: Never    Alcohol/week: 0.0 standard drinks  . Drug use: Never    Home Medications Prior to Admission medications   Medication Sig Start Date End Date Taking? Authorizing Provider  acetaminophen (TYLENOL) 325 MG tablet Take 325 mg by mouth every 6 (six) hours as  needed for moderate pain.    [provider]  amLODipine (NORVASC) 10 MG tablet Take 10 mg by mouth daily. 01/23/20   [provider]  amoxicillin-clavulanate (AUGMENTIN) 875-125 MG tablet Take 1 tablet by mouth 2 (two) times daily.    [provider]  atorvastatin (LIPITOR) 40 MG tablet Take 1 tablet (40 mg total) by mouth daily. 07/26/19   Mullis, Kiersten P, DO  Cholecalciferol (VITAMIN D) 50 MCG (2000 UT) tablet Take 2,000 Units by mouth daily.    [provider]  ciprofloxacin (CIPRO) 250 MG tablet Take 250 mg by mouth 2 (two) times daily. 01/25/20   [provider]  citalopram (CELEXA) 40 MG tablet Take 40 mg by mouth daily. 10/26/19   [provider]  DULoxetine (CYMBALTA) 30 MG capsule Take 30 mg by mouth 2 (two) times daily. 02/01/20   [provider]  DULoxetine (CYMBALTA) 60 MG capsule Take 1 capsule (60 mg total) by mouth at bedtime. For back pain/nerve pain 11/29/19   Lovorn, Aundra Millet, MD  ferrous sulfate 324 MG TBEC Take 1 tablet (324 mg total) by mouth daily with breakfast. 06/15/19   Medina-Vargas, Monina C, NP  finasteride (PROSCAR) 5 MG tablet Take 1 tablet (5 mg total) by mouth at bedtime. 06/15/19   Medina-Vargas, Monina C, NP  lipase/protease/amylase (CREON) 12000-38000 units CPEP capsule TAKE 1 CAPSULE BY MOUTH THREE TIMES DAILY WITH MEALS 01/24/20   Mullis, Kiersten P, DO  lisinopril (ZESTRIL) 10 MG tablet Take 10 mg by mouth at bedtime. 11/16/19   [provider]  lisinopril (ZESTRIL) 5 MG tablet Take 1 tablet (5 mg total) by mouth at bedtime. 06/30/19   Jackelyn Poling, DO  tamsulosin (FLOMAX) 0.4 MG CAPS capsule TAKE 1 CAPSULE(0.4 MG) BY MOUTH DAILY Patient taking differently: Take 0.4 mg by mouth daily. 06/15/19   Medina-Vargas, Monina C, NP  traMADol (ULTRAM) 50 MG tablet Take 1 tablet (50 mg total) by mouth 3 (three) times daily as needed for moderate pain. 01/31/20   Lovorn, Aundra Millet, MD  XARELTO 15 MG TABS tablet TAKE  1 TABLET(15 MG) BY MOUTH TWICE DAILY WITH A MEAL 03/13/20   Mullis, Kiersten P, DO    Allergies    Gabapentin  Review of Systems   Review of Systems  Gastrointestinal: Positive for nausea and vomiting.  Neurological: Positive for weakness.  Hematological: Bruises/bleeds easily.  All other systems reviewed and are negative.  Physical Exam Updated Vital Signs BP (!) 161/88   Pulse (!) 47   Temp (!) 97.5 F (36.4 C) (Oral)   Resp 14   Ht 6\' 4"  (1.93 m)   Wt 81.6 kg   SpO2 100%   BMI 21.91 kg/m   Physical Exam Vitals and nursing note reviewed.  Constitutional:      General: He is not in acute distress.    Appearance: He is underweight and well-nourished.     Comments: Resting in the bed in no acute distress.  Underweight  HENT:     Head: Normocephalic and atraumatic.  Eyes:     Extraocular Movements: Extraocular movements intact and EOM normal.     Conjunctiva/sclera: Conjunctivae normal.     Pupils: Pupils are equal, round, and reactive to light.  Cardiovascular:     Rate and Rhythm: Regular rhythm. Bradycardia present.     Pulses: Normal pulses and intact distal pulses.  Pulmonary:     Effort: Pulmonary effort is normal. No respiratory distress.     Breath sounds: Normal breath sounds. No wheezing.     Comments: Clear lung sounds in all fields Abdominal:     General: There is no distension.     Palpations: Abdomen is soft. There is no mass.     Tenderness: There is no abdominal tenderness. There is no guarding or rebound.     Comments: No ttp of the abd  Musculoskeletal:        General: Normal range of motion.     Cervical back: Normal range of motion and neck supple.  Skin:    General: Skin is warm and dry.     Capillary Refill: Capillary refill takes less than 2 seconds.  Neurological:     Mental Status: He is alert and oriented to person, place, and time.  Psychiatric:        Mood and Affect: Mood and affect normal.     ED Results / Procedures /  Treatments   Labs (all labs ordered are listed, but only abnormal results are displayed) Labs Reviewed - No data to display  EKG None  Radiology No results found.  Procedures Procedures   Medications Ordered in ED Medications - No data to display  ED Course  I have reviewed the triage vital signs and the nursing notes.  Pertinent labs & imaging results that were available during my care of the patient were reviewed by me and considered in my medical decision making (see chart for details).    MDM Rules/Calculators/A&P                           Patient presented for evaluation of several weeks of gradual decline.  Mostly reporting weakness.  He is having intermittent postprandial vomiting, occurring about once a day.  This is new.  No infectious symptoms including fever, cough, or known urinary symptoms although he does have a Foley cath.  I discussed with daughter that this may be agent and/or dementia related decline.  However will check for anemia, electrolyte disturbance, infection, or acute abnormality requiring hospitalization.  Will have patient be evaluated by PT as well as our social work team.  CT head negative for acute findings.  Chest x-ray viewed interpreted by me, no pneumonia, pneumothorax, effusion.  Labs overall reassuring.  Anemia slightly improved from baseline.  Creatinine improved to baseline.  Patient does have a positive Hemoccult, however no gross melena or blood.  In the setting of a normal hemoglobin, I do not believe he needs admission for this.  He will need to follow-up with his primary care doctor.  As he is having postprandial vomiting, and minimally elevated lipase, will obtain ultrasound to rule out gallbladder etiology.  However if this is normal, we can likely be discharged.  Case discussed with attending, Dr. Anitra Lauth evaluated the patient.  Pt signed out to B Laveda Norman, PA-C for f/u on [place], UA and ultimate dispo.   Final Clinical Impression(s) / ED  Diagnoses Final diagnoses:  None    Rx / DC Orders ED Discharge Orders    None       Alveria Apley, PA-C 04/05/20 1527    Gwyneth Sprout, MD 04/05/20 2119

## 2020-04-05 NOTE — Telephone Encounter (Signed)
Community message sent to [employer] regarding DME request. Will await response.   Veronda Prude, RN

## 2020-04-05 NOTE — ED Notes (Signed)
Replaced pt's leg bag with standard drainage bag for foley catheter

## 2020-04-05 NOTE — TOC Initial Note (Signed)
Transition of Care [facility]) - Initial/Assessment Note    Patient Details  Name: Brian Burns MRN: [id number] Date of Birth: 1945-05-11  [facility] of [facility]) CM/SW Contact:    Elliot Cousin, RN Phone Number: [phone] 04/05/2020, 1:21 PM  Clinical Narrative:                  TOC CM spoke to dtr, Lynden Ang, who is at pt's bedside. States pt lives in home with wife. Has RW and bedside commode. States he recently seen PCP who was working on wheelchair and HH. Offered choice for [facility]. But does not know agency HH was arranged by PCP's office. TOC CM sent message to PCP, Dr Mauri Reading and she will follow up with her RN in office for information. Will contacted [employer] for wheelchair to be delivered to room and dtr can take home.    Barriers to Discharge: Continued Medical Work up   Patient Goals and CMS Choice   CMS Medicare.gov Compare Post Acute Care list provided to:: Patient Represenative (must comment) (daughter, [name]) Choice offered to / list presented to : Adult Children  Expected Discharge Plan and Services                                                Prior Living Arrangements/Services                       Activities of Daily Living      Permission Sought/Granted                  Emotional Assessment              Admission diagnosis:  weakness;loss of appetite Patient Active Problem List   Diagnosis Date Noted  . Abnormality of gait 09/27/2019  . Seizure (HCC)   . Urge incontinence 12/08/2018  . Stenosis of lateral recess of lumbar spine 12/08/2018  . Dementia, vascular (HCC) 12/08/2018  . Decreased visual acuity of left eye  12/02/2018  . Gross hematuria 10/29/2018  . Other abnormalities of gait and mobility 09/02/2018  . Muscle weakness (generalized) 04/08/2018  . Lumbar spinal stenosis 04/05/2018  . Chronic pain of right knee 03/07/2017  . Chronic low back pain without sciatica 03/07/2017  . Nonischemic  cardiomyopathy (HCC) 09/25/2016  . Microalbuminuria 07/26/2015  . Paroxysmal atrial fibrillation (HCC)   . Coronary artery disease involving native coronary artery of native heart without angina pectoris   . Stricture, urethra   . CKD stage 3 due to type 2 diabetes mellitus (HCC) 03/16/2015  . Depression 03/16/2015  . Normocytic anemia   . History of Multiple lacunar infarcts (HCC) 01/31/2015  . Weight loss 03/18/2014  . GERD (gastroesophageal reflux disease) 10/21/2013  . AAA (abdominal aortic aneurysm) without rupture (HCC) 06/24/2013  . PVD (peripheral vascular disease) (HCC) 06/24/2013  . Orthostatic dizziness 03/18/2013  . Hearing loss of both ears 03/18/2013  . BPH (benign prostatic hyperplasia) 02/16/2013  . Type 2 diabetes mellitus with diabetic neuropathy, unspecified (HCC) 05/10/2012  . Type 2 diabetes mellitus with hypercholesterolemia (HCC) 02/05/2007  . Hypertension associated with diabetes (HCC) 03/27/2006   PCP:  Joana Reamer, DO Pharmacy:   [facility] [phone] - [place],  - [address] [address] Phone: [phone] Fax: [phone]  [id number] - [place], [place] - [address] AT [facility] [address] [place] [address] Phone: [phone] Fax: [phone]     Social Determinants of Health (SDOH) Interventions    Readmission Risk Interventions No flowsheet data found.

## 2020-04-06 DIAGNOSIS — I129 Hypertensive chronic kidney disease with stage 1 through stage 4 chronic kidney disease, or unspecified chronic kidney disease: Secondary | ICD-10-CM | POA: Diagnosis not present

## 2020-04-06 DIAGNOSIS — K219 Gastro-esophageal reflux disease without esophagitis: Secondary | ICD-10-CM | POA: Diagnosis not present

## 2020-04-06 DIAGNOSIS — E1159 Type 2 diabetes mellitus with other circulatory complications: Secondary | ICD-10-CM | POA: Diagnosis not present

## 2020-04-06 DIAGNOSIS — E1122 Type 2 diabetes mellitus with diabetic chronic kidney disease: Secondary | ICD-10-CM | POA: Diagnosis not present

## 2020-04-06 DIAGNOSIS — K449 Diaphragmatic hernia without obstruction or gangrene: Secondary | ICD-10-CM | POA: Diagnosis not present

## 2020-04-06 DIAGNOSIS — N35819 Other urethral stricture, male, unspecified site: Secondary | ICD-10-CM | POA: Diagnosis not present

## 2020-04-06 DIAGNOSIS — Z23 Encounter for immunization: Secondary | ICD-10-CM | POA: Diagnosis not present

## 2020-04-06 DIAGNOSIS — Z7901 Long term (current) use of anticoagulants: Secondary | ICD-10-CM | POA: Diagnosis not present

## 2020-04-06 DIAGNOSIS — R197 Diarrhea, unspecified: Secondary | ICD-10-CM | POA: Diagnosis not present

## 2020-04-06 DIAGNOSIS — N390 Urinary tract infection, site not specified: Secondary | ICD-10-CM | POA: Diagnosis not present

## 2020-04-06 DIAGNOSIS — R1319 Other dysphagia: Secondary | ICD-10-CM | POA: Diagnosis not present

## 2020-04-06 DIAGNOSIS — I2510 Atherosclerotic heart disease of native coronary artery without angina pectoris: Secondary | ICD-10-CM | POA: Diagnosis not present

## 2020-04-06 DIAGNOSIS — I739 Peripheral vascular disease, unspecified: Secondary | ICD-10-CM | POA: Diagnosis not present

## 2020-04-06 DIAGNOSIS — K2940 Chronic atrophic gastritis without bleeding: Secondary | ICD-10-CM | POA: Diagnosis not present

## 2020-04-06 DIAGNOSIS — E118 Type 2 diabetes mellitus with unspecified complications: Secondary | ICD-10-CM | POA: Diagnosis not present

## 2020-04-06 DIAGNOSIS — K224 Dyskinesia of esophagus: Secondary | ICD-10-CM | POA: Diagnosis not present

## 2020-04-06 DIAGNOSIS — R29810 Facial weakness: Secondary | ICD-10-CM | POA: Diagnosis not present

## 2020-04-06 DIAGNOSIS — I6523 Occlusion and stenosis of bilateral carotid arteries: Secondary | ICD-10-CM | POA: Diagnosis not present

## 2020-04-06 DIAGNOSIS — R269 Unspecified abnormalities of gait and mobility: Secondary | ICD-10-CM | POA: Diagnosis not present

## 2020-04-06 DIAGNOSIS — G319 Degenerative disease of nervous system, unspecified: Secondary | ICD-10-CM | POA: Diagnosis not present

## 2020-04-06 DIAGNOSIS — D649 Anemia, unspecified: Secondary | ICD-10-CM | POA: Diagnosis not present

## 2020-04-06 DIAGNOSIS — R6889 Other general symptoms and signs: Secondary | ICD-10-CM | POA: Diagnosis not present

## 2020-04-06 DIAGNOSIS — R001 Bradycardia, unspecified: Secondary | ICD-10-CM | POA: Diagnosis not present

## 2020-04-06 DIAGNOSIS — R1310 Dysphagia, unspecified: Secondary | ICD-10-CM | POA: Diagnosis not present

## 2020-04-06 DIAGNOSIS — R29818 Other symptoms and signs involving the nervous system: Secondary | ICD-10-CM | POA: Diagnosis not present

## 2020-04-06 DIAGNOSIS — Z79899 Other long term (current) drug therapy: Secondary | ICD-10-CM | POA: Diagnosis not present

## 2020-04-06 DIAGNOSIS — Z681 Body mass index (BMI) 19 or less, adult: Secondary | ICD-10-CM | POA: Diagnosis not present

## 2020-04-06 DIAGNOSIS — R5381 Other malaise: Secondary | ICD-10-CM | POA: Diagnosis not present

## 2020-04-06 DIAGNOSIS — I4891 Unspecified atrial fibrillation: Secondary | ICD-10-CM | POA: Diagnosis not present

## 2020-04-06 DIAGNOSIS — I6389 Other cerebral infarction: Secondary | ICD-10-CM | POA: Diagnosis not present

## 2020-04-06 DIAGNOSIS — R531 Weakness: Secondary | ICD-10-CM | POA: Diagnosis not present

## 2020-04-06 DIAGNOSIS — H918X3 Other specified hearing loss, bilateral: Secondary | ICD-10-CM | POA: Diagnosis not present

## 2020-04-06 DIAGNOSIS — F32A Depression, unspecified: Secondary | ICD-10-CM | POA: Diagnosis not present

## 2020-04-06 DIAGNOSIS — N1831 Chronic kidney disease, stage 3a: Secondary | ICD-10-CM | POA: Diagnosis not present

## 2020-04-06 DIAGNOSIS — Z743 Need for continuous supervision: Secondary | ICD-10-CM | POA: Diagnosis not present

## 2020-04-06 DIAGNOSIS — I499 Cardiac arrhythmia, unspecified: Secondary | ICD-10-CM | POA: Diagnosis not present

## 2020-04-06 DIAGNOSIS — K861 Other chronic pancreatitis: Secondary | ICD-10-CM | POA: Diagnosis not present

## 2020-04-06 DIAGNOSIS — M6281 Muscle weakness (generalized): Secondary | ICD-10-CM | POA: Diagnosis not present

## 2020-04-06 DIAGNOSIS — R933 Abnormal findings on diagnostic imaging of other parts of digestive tract: Secondary | ICD-10-CM | POA: Diagnosis not present

## 2020-04-06 DIAGNOSIS — N183 Chronic kidney disease, stage 3 unspecified: Secondary | ICD-10-CM | POA: Diagnosis not present

## 2020-04-06 DIAGNOSIS — R634 Abnormal weight loss: Secondary | ICD-10-CM | POA: Diagnosis not present

## 2020-04-06 DIAGNOSIS — I6622 Occlusion and stenosis of left posterior cerebral artery: Secondary | ICD-10-CM | POA: Diagnosis not present

## 2020-04-06 DIAGNOSIS — G459 Transient cerebral ischemic attack, unspecified: Secondary | ICD-10-CM | POA: Diagnosis not present

## 2020-04-06 DIAGNOSIS — R1312 Dysphagia, oropharyngeal phase: Secondary | ICD-10-CM | POA: Diagnosis not present

## 2020-04-06 DIAGNOSIS — R935 Abnormal findings on diagnostic imaging of other abdominal regions, including retroperitoneum: Secondary | ICD-10-CM | POA: Diagnosis not present

## 2020-04-06 DIAGNOSIS — R279 Unspecified lack of coordination: Secondary | ICD-10-CM | POA: Diagnosis not present

## 2020-04-06 DIAGNOSIS — I480 Paroxysmal atrial fibrillation: Secondary | ICD-10-CM | POA: Diagnosis not present

## 2020-04-06 DIAGNOSIS — I69322 Dysarthria following cerebral infarction: Secondary | ICD-10-CM | POA: Diagnosis not present

## 2020-04-06 DIAGNOSIS — R410 Disorientation, unspecified: Secondary | ICD-10-CM | POA: Diagnosis not present

## 2020-04-06 DIAGNOSIS — I69398 Other sequelae of cerebral infarction: Secondary | ICD-10-CM | POA: Diagnosis not present

## 2020-04-06 DIAGNOSIS — E440 Moderate protein-calorie malnutrition: Secondary | ICD-10-CM | POA: Diagnosis not present

## 2020-04-06 DIAGNOSIS — R2681 Unsteadiness on feet: Secondary | ICD-10-CM | POA: Diagnosis not present

## 2020-04-06 DIAGNOSIS — E1151 Type 2 diabetes mellitus with diabetic peripheral angiopathy without gangrene: Secondary | ICD-10-CM | POA: Diagnosis not present

## 2020-04-06 DIAGNOSIS — G8929 Other chronic pain: Secondary | ICD-10-CM | POA: Diagnosis not present

## 2020-04-06 DIAGNOSIS — Z20822 Contact with and (suspected) exposure to covid-19: Secondary | ICD-10-CM | POA: Diagnosis not present

## 2020-04-06 DIAGNOSIS — R2990 Unspecified symptoms and signs involving the nervous system: Secondary | ICD-10-CM | POA: Diagnosis not present

## 2020-04-06 DIAGNOSIS — E559 Vitamin D deficiency, unspecified: Secondary | ICD-10-CM | POA: Diagnosis not present

## 2020-04-06 DIAGNOSIS — Q394 Esophageal web: Secondary | ICD-10-CM | POA: Diagnosis not present

## 2020-04-06 DIAGNOSIS — R0902 Hypoxemia: Secondary | ICD-10-CM | POA: Diagnosis not present

## 2020-04-06 DIAGNOSIS — I10 Essential (primary) hypertension: Secondary | ICD-10-CM | POA: Diagnosis not present

## 2020-04-06 DIAGNOSIS — F0150 Vascular dementia without behavioral disturbance: Secondary | ICD-10-CM | POA: Diagnosis present

## 2020-04-06 DIAGNOSIS — K5900 Constipation, unspecified: Secondary | ICD-10-CM | POA: Diagnosis not present

## 2020-04-06 DIAGNOSIS — G8191 Hemiplegia, unspecified affecting right dominant side: Secondary | ICD-10-CM | POA: Diagnosis not present

## 2020-04-06 DIAGNOSIS — R4781 Slurred speech: Secondary | ICD-10-CM | POA: Diagnosis not present

## 2020-04-06 DIAGNOSIS — R569 Unspecified convulsions: Secondary | ICD-10-CM | POA: Diagnosis not present

## 2020-04-06 DIAGNOSIS — R4182 Altered mental status, unspecified: Secondary | ICD-10-CM | POA: Diagnosis not present

## 2020-04-06 DIAGNOSIS — E860 Dehydration: Secondary | ICD-10-CM | POA: Diagnosis not present

## 2020-04-06 DIAGNOSIS — N401 Enlarged prostate with lower urinary tract symptoms: Secondary | ICD-10-CM | POA: Diagnosis present

## 2020-04-06 DIAGNOSIS — E785 Hyperlipidemia, unspecified: Secondary | ICD-10-CM | POA: Diagnosis not present

## 2020-04-06 DIAGNOSIS — Z87891 Personal history of nicotine dependence: Secondary | ICD-10-CM | POA: Diagnosis not present

## 2020-04-06 DIAGNOSIS — R293 Abnormal posture: Secondary | ICD-10-CM | POA: Diagnosis not present

## 2020-04-06 DIAGNOSIS — R059 Cough, unspecified: Secondary | ICD-10-CM | POA: Diagnosis not present

## 2020-04-06 DIAGNOSIS — I130 Hypertensive heart and chronic kidney disease with heart failure and stage 1 through stage 4 chronic kidney disease, or unspecified chronic kidney disease: Secondary | ICD-10-CM | POA: Diagnosis not present

## 2020-04-06 DIAGNOSIS — R29705 NIHSS score 5: Secondary | ICD-10-CM | POA: Diagnosis not present

## 2020-04-06 DIAGNOSIS — R262 Difficulty in walking, not elsewhere classified: Secondary | ICD-10-CM | POA: Diagnosis not present

## 2020-04-06 MED ORDER — INFLUENZA VAC A&B SA ADJ QUAD 0.5 ML IM PRSY
0.5000 mL | PREFILLED_SYRINGE | Freq: Once | INTRAMUSCULAR | Status: AC
  Administered 2020-04-06: 0.5 mL via INTRAMUSCULAR
  Filled 2020-04-06: qty 0.5

## 2020-04-06 NOTE — ED Notes (Addendum)
Manual BP taken 154/88.

## 2020-04-06 NOTE — ED Notes (Signed)
Tried to call report to facility .  No answer.

## 2020-04-06 NOTE — ED Notes (Signed)
Patient received lunch tray 

## 2020-04-06 NOTE — ED Notes (Signed)
Patient discharged to [facility] by [facility].

## 2020-04-06 NOTE — ED Notes (Deleted)
Paged doctor on amnion about BP pressure and urine.

## 2020-04-06 NOTE — ED Provider Notes (Addendum)
Emergency Medicine Observation Re-evaluation Note  Brian Burns is a 75 y.o. male, seen on rounds today.  Pt initially presented to the ED for complaints of Weakness and Nausea Currently, the patient is awaiting skilled nursing facility.  Worsening dementia.Marland Kitchen  Physical Exam  BP (!) 149/85 (BP Location: Left Arm)   Pulse 62   Temp (!) 97.5 F (36.4 C) (Oral)   Resp 18   Ht 6\' 4"  (1.93 m)   Wt 81.6 kg   SpO2 100%   BMI 21.91 kg/m  Physical Exam General: Resting comfortably no acute distress Cardiac: Regular rate rhythm Lungs: Clear sounds with normal work of breathing. Psych: Calm cooperative  ED Course / MDM  EKG:EKG Interpretation  Date/Time:  Wednesday April 05 2020 11:09:26 EST Ventricular Rate:  65 PR Interval:    QRS Duration: 97 QT Interval:  445 QTC Calculation: 463 R Axis:   59 Text Interpretation: Sinus rhythm Ventricular premature complex more pronounced Nonspecific T abnormalities, diffuse leads Confirmed by 01-30-1991 (Gwyneth Sprout) on 04/05/2020 12:50:16 PM    I have reviewed the labs performed to date as well as medications administered while in observation.  Recent changes in the last 24 hours include no acute events.  Plan  Current plan is for skilled nursing facility placement. Patient is not under full IVC at this time.   I was called by ER care management team.  They found a skilled nursing facility able to manage this patient's needs.  He is stable for discharge.    06/05/2020, MD 04/06/20 [phone]

## 2020-04-06 NOTE — ED Notes (Signed)
Daughter at bedside/placed recliner in room for daughter

## 2020-04-06 NOTE — Progress Notes (Signed)
TOC CM/CSW contacted [employer] for the status of auth.  [facility] is still pending, awaiting clinicals.  A new fax number was obtained [phone].  CSW faxed information requested.  Reference #:  [id number]  CSW will continue to follow for dc needs.  Shirely Toren Tarpley-Carter, MSW, LCSW-A Pronouns:  She, Her, Hers                  [facility] ED Transitions of CareClinical Social Worker [email or web address] [phone]

## 2020-04-06 NOTE — ED Notes (Signed)
Family at bedside. 

## 2020-04-06 NOTE — Progress Notes (Addendum)
230 pm [facility] Berkley Harvey [id number], ref # [id number], pt was approved for SNF, 5 days with next review date 04/10/2020, CM Dia Crawford. Faxed AVS, vaccination sheet and MAR to [facility]. Called PT[facility]. Need to call report to [phone] room 125 A. ED provider and RN updated. Isidoro Donning RN CCM, [facility] ED TOC CM [phone]  1217 pm Provided dtr with choice for SNF and Medicare list with ratings. Contacted [employer] and not received confirmation. Her second choice is [address] Contacted admission rep, Teena and she will review in the hub. Pt will need flu shot and she will need copy of his vaccination card. Dtr states she will go home to get his card. He has not received his booster. Isidoro Donning RN CCM, [facility] ED TOC CM [phone]      [facility] SN[facility]  Accepted N/A [address] [place] [address] [phone] -  [facility]  Accepted N/A [address], [place] [address] [phone] -  [facility] LIVING AND [facility] Preferred SNF  Accepted N/A [address], [place] [address] [phone] [facility] Preferred SNF  Accepted N/A [address], [place] [address] [phone] -  [facility]  Accepted N/A [address] [place] [address] [phone] [facility] CARE Preferred SNF  Accepted N/A [address], [place] [address] [phone] -  [facility] SN[facility]  Accepted N/A [address] [place], [place] [address] [phone] -  [facility] PLACE Preferred SNF  Accepted N/A [address], [place] [address] [phone] -  [facility] MEMORY CARE SNF  Pending - Request Sent N/A [address] [place] [address] [phone] -  [facility] Preferred SNF  Pending - Request Sent N/A [address], [place]  [address] [phone] -  [facility] SNF [facility]  Pending - Request Sent N/A [address], [place] [address] [phone] [facility] Preferred SNF  Pending - Request Sent N/A [address] [place] [address] [phone] -  [facility]  Pending - Request Sent N/A [address], [place] [address] [phone] -  [facility] AT [facility] SNF  Pending - Request Sent N/A [address] [place] [address] [phone] -  [facility] Preferred SNF  Pending - Request Sent N/A [address] [place] [address] [phone] -  [facility] SNF  Pending - Request Sent N/A [address] [place] [address] [phone] -  [facility] AND [facility] SNF  Pending - No Request Sent N/A [address] [place] [address] [phone] - [facility] PLACE Preferred SNF  Declined  No reason given N/A [address], [place] [address] [phone] [facility] Preferred SNF  Declined N/A [address] [place] [address] [phone] -  [facility] Preferred SNF  Declined  No bed availablity N/A [address], [place] [address] [phone] -  [facility] AT [facility] PREFERRED SNF/ALF  [employer] not in network N/A [address] [place] [address] [phone] -  [facility] Preferred SNF  Declined  unable to meet needs N/A [address], [place] [address] [phone] -  [facility] Preferred SNF  Declined N/A [address], [place] [address] [phone] -  [facility] PREFERRED SNF  Declined  Bed not available N/A [address] [place]  [place] [address] [phone] -

## 2020-04-06 NOTE — ED Notes (Signed)
Pt to room 30. Pt calm, cooperative, no s/s of distress. Pt oriented to unit and room. Pt changed and made comfortable. Food and drink offered.

## 2020-04-06 NOTE — ED Notes (Signed)
[facility] here to transport patient 

## 2020-04-08 ENCOUNTER — Other Ambulatory Visit: Payer: Self-pay | Admitting: Family Medicine

## 2020-04-08 DIAGNOSIS — E782 Mixed hyperlipidemia: Secondary | ICD-10-CM

## 2020-04-10 ENCOUNTER — Encounter: Payer: Medicare Other | Admitting: Physical Medicine and Rehabilitation

## 2020-04-10 DIAGNOSIS — N183 Chronic kidney disease, stage 3 unspecified: Secondary | ICD-10-CM | POA: Diagnosis not present

## 2020-04-10 DIAGNOSIS — E1151 Type 2 diabetes mellitus with diabetic peripheral angiopathy without gangrene: Secondary | ICD-10-CM | POA: Diagnosis not present

## 2020-04-10 DIAGNOSIS — K5900 Constipation, unspecified: Secondary | ICD-10-CM | POA: Diagnosis not present

## 2020-04-10 DIAGNOSIS — R569 Unspecified convulsions: Secondary | ICD-10-CM | POA: Diagnosis not present

## 2020-04-10 DIAGNOSIS — R5381 Other malaise: Secondary | ICD-10-CM | POA: Diagnosis not present

## 2020-04-10 DIAGNOSIS — I480 Paroxysmal atrial fibrillation: Secondary | ICD-10-CM | POA: Diagnosis not present

## 2020-04-10 DIAGNOSIS — F32A Depression, unspecified: Secondary | ICD-10-CM | POA: Diagnosis not present

## 2020-04-10 DIAGNOSIS — K219 Gastro-esophageal reflux disease without esophagitis: Secondary | ICD-10-CM | POA: Diagnosis not present

## 2020-04-10 NOTE — Progress Notes (Signed)
04/10/2020 1220 TOC CM received call from [facility], reference number [id number]. Next review date will be on 04/10/2020. Updated [facility] SN[facility] rehab. Isidoro Donning RN CCM, [facility] ED TOC CM [phone]

## 2020-04-10 NOTE — Progress Notes (Deleted)
   Subjective:   Patient ID: Brian Burns    DOB: [birth year], 75 y.o. male   MRN: [id number]  Brian Burns is a 75 y.o. male with a history of *** here for ***  Weakness  Hospital Follow up Patient follows up today for continued progressive weakness and hospital follow up. Patient seen in ED on 3/9 for these complaints. He was also having intermittent postprandial vomiting. He has had progressive weight loss and difficulty walking leading to more frequent falls. He also has history of urinary retention requiring foley catheter. Has been following with urology. He denied any infectious symptoms. Work up in the ED was overall reassuring. UA overall stable with large Hgb, 20 ketones, 100 protein, moderate leuks, >50 RBC, >50 WBC, no bacteria, budding yeast present. Lipase only slightly elevated at 61. CMP with mild hypokalemia (K 3.1) but otherwise normal with improved kidney function. CBC with stable and slightly improved Hgb, otherwise normal. POC FOBT positive for blood. Last endoscopy in 2017 notable for active bleeding ulcer vs AVM successfully tamponaded at time of procedure. Sigmoidoscopy in 2011 (unable to see results in chart).  CT head with no acute abnormality, notable for extensive intracranial atherosclerotic disease with atrophy and moderate chronic microvascular ischemic change. CXR negative.  RUQ ultrasound with gallbladder sludge without evidence of cholelithiasis or acute cholecystitis.   Patient was discharged to SNF ([facility]) given inability to take care of him self and difficulty for wife to care for him.   Today he notes ***  Review of Systems:  Per HPI.   Objective:   There were no vitals taken for this visit. Vitals and nursing note reviewed.  General: pleasant ***, sitting comfortably in exam chair, well nourished, well developed, in no acute distress with non-toxic appearance HEENT: normocephalic, atraumatic, moist mucous membranes, oropharynx clear without  erythema or exudate, TM normal bilaterally  Neck: supple, non-tender without lymphadenopathy CV: regular rate and rhythm without murmurs, rubs, or gallops, no lower extremity edema, 2+ radial and pedal pulses bilaterally Lungs: clear to auscultation bilaterally with normal work of breathing on room air Resp: breathing comfortably on room air, speaking in full sentences Abdomen: soft, non-tender, non-distended, no masses or organomegaly palpable, normoactive bowel sounds Skin: warm, dry, no rashes or lesions Extremities: warm and well perfused, normal tone MSK: ROM grossly intact, strength intact, gait normal Neuro: Alert and oriented, speech normal  Assessment & Plan:   No problem-specific Assessment & Plan notes found for this encounter.  No orders of the defined types were placed in this encounter.  No orders of the defined types were placed in this encounter.   {    This will disappear when note is signed, click to select method of visit    :1}  Orpah Cobb, DO PGY-3, [facility] 04/10/2020 9:38 AM

## 2020-04-12 ENCOUNTER — Other Ambulatory Visit: Payer: Self-pay | Admitting: Family Medicine

## 2020-04-12 ENCOUNTER — Other Ambulatory Visit: Payer: Self-pay | Admitting: Adult Health

## 2020-04-12 ENCOUNTER — Ambulatory Visit: Payer: Medicare Other

## 2020-04-12 ENCOUNTER — Ambulatory Visit: Payer: Medicare Other | Admitting: Family Medicine

## 2020-04-12 DIAGNOSIS — N401 Enlarged prostate with lower urinary tract symptoms: Secondary | ICD-10-CM

## 2020-04-12 DIAGNOSIS — E782 Mixed hyperlipidemia: Secondary | ICD-10-CM

## 2020-04-12 DIAGNOSIS — R3914 Feeling of incomplete bladder emptying: Secondary | ICD-10-CM

## 2020-04-13 DIAGNOSIS — N183 Chronic kidney disease, stage 3 unspecified: Secondary | ICD-10-CM | POA: Diagnosis not present

## 2020-04-13 DIAGNOSIS — I480 Paroxysmal atrial fibrillation: Secondary | ICD-10-CM | POA: Diagnosis not present

## 2020-04-13 DIAGNOSIS — R5381 Other malaise: Secondary | ICD-10-CM | POA: Diagnosis not present

## 2020-04-13 DIAGNOSIS — I2510 Atherosclerotic heart disease of native coronary artery without angina pectoris: Secondary | ICD-10-CM | POA: Diagnosis not present

## 2020-04-13 DIAGNOSIS — F32A Depression, unspecified: Secondary | ICD-10-CM | POA: Diagnosis not present

## 2020-04-19 ENCOUNTER — Telehealth: Payer: Self-pay | Admitting: *Deleted

## 2020-04-19 NOTE — Chronic Care Management (AMB) (Signed)
   Care Management   Outreach Note  04/19/2020 Name: MANDEEP KISER MRN: [id number] DOB: 1945-06-09  JAMS TRICKETT is a 75 y.o. year old male who is a primary care patient of Pollie Meyer Estevan Ryder, MD. I reached out to [name] by phone today in response to a referral sent by Mr. Bernadene Bell Gottwald's PCP, Latrelle Dodrill, MD.     An unsuccessful telephone outreach was attempted today. The patient was referred to the case management team for assistance with care management and care coordination.   Follow Up Plan: A HIPAA compliant phone message was left for the patient providing contact information and requesting a return call. The care management team will reach out to the patient again over the next 7 days. If patient returns call to provider office, please advise to call Embedded Care Management Care Guide Gwenevere Ghazi at [phone].  Gwenevere Ghazi  Care Guide, Embedded Care Coordination [facility] Management

## 2020-04-21 DIAGNOSIS — R5381 Other malaise: Secondary | ICD-10-CM | POA: Diagnosis not present

## 2020-04-21 DIAGNOSIS — K219 Gastro-esophageal reflux disease without esophagitis: Secondary | ICD-10-CM | POA: Diagnosis not present

## 2020-04-21 DIAGNOSIS — K5900 Constipation, unspecified: Secondary | ICD-10-CM | POA: Diagnosis not present

## 2020-04-21 DIAGNOSIS — I2510 Atherosclerotic heart disease of native coronary artery without angina pectoris: Secondary | ICD-10-CM | POA: Diagnosis not present

## 2020-04-21 DIAGNOSIS — E860 Dehydration: Secondary | ICD-10-CM | POA: Diagnosis not present

## 2020-04-21 DIAGNOSIS — E559 Vitamin D deficiency, unspecified: Secondary | ICD-10-CM | POA: Diagnosis not present

## 2020-04-21 NOTE — Chronic Care Management (AMB) (Signed)
  Care Management   Outreach Note  04/21/2020 Name: Brian Burns MRN: [id number] DOB: [birth year]  Referred by: System, Provider Not In Reason for referral : Care Coordination (Initial outreach to schedule referral with Licensed Clinical Social Worker )   A second unsuccessful telephone outreach was attempted today. The patient was referred to the case management team for assistance with care management and care coordination.   Follow Up Plan: A HIPAA compliant phone message was left for the patient providing contact information and requesting a return call. The care management team will reach out to the patient again over the next 7 days. If patient returns call to provider office, please advise to call Embedded Care Management Care Guide Gwenevere Ghazi at [phone].  Gwenevere Ghazi  Care Guide, Embedded Care Coordination [facility] Management

## 2020-04-26 NOTE — Chronic Care Management (AMB) (Signed)
  Care Management   Note  04/26/2020 Name: Brian Burns MRN: [id number] DOB: Jul 10, 1945  Brian Burns is a 75 y.o. year old male who is a primary care patient of Girtha Hake, DO. . I reached out to [name] by phone today in response to a referral sent by [name], Mauri Reading, Renae Fickle, DO.  [name] was given information about care management services today including:  1. Care management services include personalized support from designated clinical staff supervised by his physician, including individualized plan of care and coordination with other care providers 2. 24/7 contact phone numbers for assistance for urgent and routine care needs. 3. The patient may stop care management services at any time by phone call to the office staff.  [name] states patient in in nursing facility and did not agree to enrollment in care management services and does not wish to consider at this time.  Follow up plan: [name] declines further follow up and engagement by the care management team. Appropriate care team members and provider have been notified via electronic communication.  We have been unable to make contact with the patient for follow up. The care management team is available to follow up with the patient after provider conversation with the patient regarding recommendation for care management engagement and subsequent re-referral to the care management team.   [facility] Guide, Embedded Care Coordination [facility]  Care Management

## 2020-04-28 DIAGNOSIS — I2510 Atherosclerotic heart disease of native coronary artery without angina pectoris: Secondary | ICD-10-CM | POA: Diagnosis not present

## 2020-04-28 DIAGNOSIS — N1831 Chronic kidney disease, stage 3a: Secondary | ICD-10-CM | POA: Diagnosis not present

## 2020-04-28 DIAGNOSIS — R5381 Other malaise: Secondary | ICD-10-CM | POA: Diagnosis not present

## 2020-04-28 DIAGNOSIS — D649 Anemia, unspecified: Secondary | ICD-10-CM | POA: Diagnosis not present

## 2020-04-28 DIAGNOSIS — E785 Hyperlipidemia, unspecified: Secondary | ICD-10-CM | POA: Diagnosis not present

## 2020-04-28 DIAGNOSIS — K219 Gastro-esophageal reflux disease without esophagitis: Secondary | ICD-10-CM | POA: Diagnosis not present

## 2020-04-28 DIAGNOSIS — E118 Type 2 diabetes mellitus with unspecified complications: Secondary | ICD-10-CM | POA: Diagnosis not present

## 2020-04-28 DIAGNOSIS — I10 Essential (primary) hypertension: Secondary | ICD-10-CM | POA: Diagnosis not present

## 2020-04-28 DIAGNOSIS — I130 Hypertensive heart and chronic kidney disease with heart failure and stage 1 through stage 4 chronic kidney disease, or unspecified chronic kidney disease: Secondary | ICD-10-CM | POA: Diagnosis not present

## 2020-04-28 DIAGNOSIS — I480 Paroxysmal atrial fibrillation: Secondary | ICD-10-CM | POA: Diagnosis not present

## 2020-04-29 DIAGNOSIS — E860 Dehydration: Secondary | ICD-10-CM | POA: Diagnosis not present

## 2020-05-01 DIAGNOSIS — N1831 Chronic kidney disease, stage 3a: Secondary | ICD-10-CM | POA: Diagnosis not present

## 2020-05-01 DIAGNOSIS — N390 Urinary tract infection, site not specified: Secondary | ICD-10-CM | POA: Diagnosis not present

## 2020-05-01 DIAGNOSIS — E118 Type 2 diabetes mellitus with unspecified complications: Secondary | ICD-10-CM | POA: Diagnosis not present

## 2020-05-10 DIAGNOSIS — I2510 Atherosclerotic heart disease of native coronary artery without angina pectoris: Secondary | ICD-10-CM | POA: Diagnosis not present

## 2020-05-10 DIAGNOSIS — E785 Hyperlipidemia, unspecified: Secondary | ICD-10-CM | POA: Diagnosis not present

## 2020-05-10 DIAGNOSIS — I10 Essential (primary) hypertension: Secondary | ICD-10-CM | POA: Diagnosis not present

## 2020-05-10 DIAGNOSIS — D649 Anemia, unspecified: Secondary | ICD-10-CM | POA: Diagnosis not present

## 2020-05-10 DIAGNOSIS — I480 Paroxysmal atrial fibrillation: Secondary | ICD-10-CM | POA: Diagnosis not present

## 2020-05-10 DIAGNOSIS — K219 Gastro-esophageal reflux disease without esophagitis: Secondary | ICD-10-CM | POA: Diagnosis not present

## 2020-05-10 DIAGNOSIS — R410 Disorientation, unspecified: Secondary | ICD-10-CM | POA: Diagnosis not present

## 2020-05-10 DIAGNOSIS — R5381 Other malaise: Secondary | ICD-10-CM | POA: Diagnosis not present

## 2020-05-11 DIAGNOSIS — I2510 Atherosclerotic heart disease of native coronary artery without angina pectoris: Secondary | ICD-10-CM | POA: Diagnosis not present

## 2020-05-11 DIAGNOSIS — R5381 Other malaise: Secondary | ICD-10-CM | POA: Diagnosis not present

## 2020-05-11 DIAGNOSIS — K219 Gastro-esophageal reflux disease without esophagitis: Secondary | ICD-10-CM | POA: Diagnosis not present

## 2020-05-11 DIAGNOSIS — E1159 Type 2 diabetes mellitus with other circulatory complications: Secondary | ICD-10-CM | POA: Diagnosis not present

## 2020-05-11 DIAGNOSIS — N183 Chronic kidney disease, stage 3 unspecified: Secondary | ICD-10-CM | POA: Diagnosis not present

## 2020-05-17 DIAGNOSIS — E559 Vitamin D deficiency, unspecified: Secondary | ICD-10-CM | POA: Diagnosis not present

## 2020-05-17 DIAGNOSIS — K5900 Constipation, unspecified: Secondary | ICD-10-CM | POA: Diagnosis not present

## 2020-05-17 DIAGNOSIS — K219 Gastro-esophageal reflux disease without esophagitis: Secondary | ICD-10-CM | POA: Diagnosis not present

## 2020-05-17 DIAGNOSIS — I2510 Atherosclerotic heart disease of native coronary artery without angina pectoris: Secondary | ICD-10-CM | POA: Diagnosis not present

## 2020-05-17 DIAGNOSIS — I480 Paroxysmal atrial fibrillation: Secondary | ICD-10-CM | POA: Diagnosis not present

## 2020-05-17 DIAGNOSIS — I10 Essential (primary) hypertension: Secondary | ICD-10-CM | POA: Diagnosis not present

## 2020-05-17 DIAGNOSIS — R5381 Other malaise: Secondary | ICD-10-CM | POA: Diagnosis not present

## 2020-05-18 ENCOUNTER — Emergency Department (HOSPITAL_COMMUNITY): Payer: Medicare Other

## 2020-05-18 ENCOUNTER — Inpatient Hospital Stay (HOSPITAL_COMMUNITY)
Admission: EM | Admit: 2020-05-18 | Discharge: 2020-05-23 | DRG: 391 | Disposition: A | Discharge status: Skilled Nursing Facility | Payer: Medicare Other | Source: Skilled Nursing Facility | Attending: Family Medicine | Admitting: Family Medicine

## 2020-05-18 ENCOUNTER — Other Ambulatory Visit: Payer: Self-pay

## 2020-05-18 ENCOUNTER — Encounter (HOSPITAL_COMMUNITY): Payer: Self-pay | Admitting: Pharmacy Technician

## 2020-05-18 ENCOUNTER — Observation Stay (HOSPITAL_COMMUNITY): Payer: Medicare Other

## 2020-05-18 DIAGNOSIS — I10 Essential (primary) hypertension: Secondary | ICD-10-CM | POA: Diagnosis present

## 2020-05-18 DIAGNOSIS — R29818 Other symptoms and signs involving the nervous system: Secondary | ICD-10-CM | POA: Diagnosis not present

## 2020-05-18 DIAGNOSIS — E440 Moderate protein-calorie malnutrition: Secondary | ICD-10-CM

## 2020-05-18 DIAGNOSIS — Z681 Body mass index (BMI) 19 or less, adult: Secondary | ICD-10-CM | POA: Diagnosis not present

## 2020-05-18 DIAGNOSIS — K2940 Chronic atrophic gastritis without bleeding: Secondary | ICD-10-CM | POA: Diagnosis not present

## 2020-05-18 DIAGNOSIS — R1310 Dysphagia, unspecified: Secondary | ICD-10-CM

## 2020-05-18 DIAGNOSIS — I6602 Occlusion and stenosis of left middle cerebral artery: Secondary | ICD-10-CM | POA: Diagnosis present

## 2020-05-18 DIAGNOSIS — Z87891 Personal history of nicotine dependence: Secondary | ICD-10-CM

## 2020-05-18 DIAGNOSIS — N35819 Other urethral stricture, male, unspecified site: Secondary | ICD-10-CM | POA: Diagnosis present

## 2020-05-18 DIAGNOSIS — I6389 Other cerebral infarction: Secondary | ICD-10-CM | POA: Diagnosis not present

## 2020-05-18 DIAGNOSIS — R1319 Other dysphagia: Secondary | ICD-10-CM

## 2020-05-18 DIAGNOSIS — E559 Vitamin D deficiency, unspecified: Secondary | ICD-10-CM | POA: Diagnosis present

## 2020-05-18 DIAGNOSIS — G319 Degenerative disease of nervous system, unspecified: Secondary | ICD-10-CM | POA: Diagnosis not present

## 2020-05-18 DIAGNOSIS — R1312 Dysphagia, oropharyngeal phase: Secondary | ICD-10-CM | POA: Diagnosis not present

## 2020-05-18 DIAGNOSIS — G8929 Other chronic pain: Secondary | ICD-10-CM | POA: Diagnosis present

## 2020-05-18 DIAGNOSIS — K449 Diaphragmatic hernia without obstruction or gangrene: Secondary | ICD-10-CM | POA: Diagnosis not present

## 2020-05-18 DIAGNOSIS — G459 Transient cerebral ischemic attack, unspecified: Secondary | ICD-10-CM | POA: Diagnosis present

## 2020-05-18 DIAGNOSIS — E785 Hyperlipidemia, unspecified: Secondary | ICD-10-CM | POA: Diagnosis not present

## 2020-05-18 DIAGNOSIS — K219 Gastro-esophageal reflux disease without esophagitis: Secondary | ICD-10-CM | POA: Diagnosis present

## 2020-05-18 DIAGNOSIS — I499 Cardiac arrhythmia, unspecified: Secondary | ICD-10-CM | POA: Diagnosis not present

## 2020-05-18 DIAGNOSIS — N1831 Chronic kidney disease, stage 3a: Secondary | ICD-10-CM | POA: Diagnosis present

## 2020-05-18 DIAGNOSIS — Z20822 Contact with and (suspected) exposure to covid-19: Secondary | ICD-10-CM | POA: Diagnosis present

## 2020-05-18 DIAGNOSIS — R2990 Unspecified symptoms and signs involving the nervous system: Secondary | ICD-10-CM

## 2020-05-18 DIAGNOSIS — Z8249 Family history of ischemic heart disease and other diseases of the circulatory system: Secondary | ICD-10-CM

## 2020-05-18 DIAGNOSIS — R6889 Other general symptoms and signs: Secondary | ICD-10-CM | POA: Diagnosis not present

## 2020-05-18 DIAGNOSIS — R933 Abnormal findings on diagnostic imaging of other parts of digestive tract: Secondary | ICD-10-CM | POA: Diagnosis not present

## 2020-05-18 DIAGNOSIS — R1314 Dysphagia, pharyngoesophageal phase: Secondary | ICD-10-CM | POA: Diagnosis present

## 2020-05-18 DIAGNOSIS — Z79899 Other long term (current) drug therapy: Secondary | ICD-10-CM | POA: Diagnosis not present

## 2020-05-18 DIAGNOSIS — I714 Abdominal aortic aneurysm, without rupture: Secondary | ICD-10-CM | POA: Diagnosis present

## 2020-05-18 DIAGNOSIS — I6622 Occlusion and stenosis of left posterior cerebral artery: Secondary | ICD-10-CM | POA: Diagnosis not present

## 2020-05-18 DIAGNOSIS — Q394 Esophageal web: Secondary | ICD-10-CM

## 2020-05-18 DIAGNOSIS — R531 Weakness: Secondary | ICD-10-CM | POA: Diagnosis not present

## 2020-05-18 DIAGNOSIS — K224 Dyskinesia of esophagus: Principal | ICD-10-CM | POA: Diagnosis present

## 2020-05-18 DIAGNOSIS — R569 Unspecified convulsions: Secondary | ICD-10-CM | POA: Diagnosis not present

## 2020-05-18 DIAGNOSIS — R4182 Altered mental status, unspecified: Secondary | ICD-10-CM

## 2020-05-18 DIAGNOSIS — R634 Abnormal weight loss: Secondary | ICD-10-CM | POA: Diagnosis present

## 2020-05-18 DIAGNOSIS — I69322 Dysarthria following cerebral infarction: Secondary | ICD-10-CM | POA: Diagnosis not present

## 2020-05-18 DIAGNOSIS — I480 Paroxysmal atrial fibrillation: Secondary | ICD-10-CM | POA: Diagnosis present

## 2020-05-18 DIAGNOSIS — R0902 Hypoxemia: Secondary | ICD-10-CM | POA: Diagnosis not present

## 2020-05-18 DIAGNOSIS — K861 Other chronic pancreatitis: Secondary | ICD-10-CM | POA: Diagnosis present

## 2020-05-18 DIAGNOSIS — R935 Abnormal findings on diagnostic imaging of other abdominal regions, including retroperitoneum: Secondary | ICD-10-CM | POA: Diagnosis not present

## 2020-05-18 DIAGNOSIS — D649 Anemia, unspecified: Secondary | ICD-10-CM | POA: Diagnosis present

## 2020-05-18 DIAGNOSIS — G8191 Hemiplegia, unspecified affecting right dominant side: Secondary | ICD-10-CM

## 2020-05-18 DIAGNOSIS — I2510 Atherosclerotic heart disease of native coronary artery without angina pectoris: Secondary | ICD-10-CM | POA: Diagnosis not present

## 2020-05-18 DIAGNOSIS — F0150 Vascular dementia without behavioral disturbance: Secondary | ICD-10-CM | POA: Diagnosis present

## 2020-05-18 DIAGNOSIS — R29810 Facial weakness: Secondary | ICD-10-CM | POA: Diagnosis not present

## 2020-05-18 DIAGNOSIS — Z841 Family history of disorders of kidney and ureter: Secondary | ICD-10-CM

## 2020-05-18 DIAGNOSIS — R29705 NIHSS score 5: Secondary | ICD-10-CM | POA: Diagnosis not present

## 2020-05-18 DIAGNOSIS — R059 Cough, unspecified: Secondary | ICD-10-CM | POA: Diagnosis not present

## 2020-05-18 DIAGNOSIS — F32A Depression, unspecified: Secondary | ICD-10-CM | POA: Diagnosis present

## 2020-05-18 DIAGNOSIS — N401 Enlarged prostate with lower urinary tract symptoms: Secondary | ICD-10-CM | POA: Diagnosis present

## 2020-05-18 DIAGNOSIS — M545 Low back pain, unspecified: Secondary | ICD-10-CM | POA: Diagnosis present

## 2020-05-18 DIAGNOSIS — M6281 Muscle weakness (generalized): Secondary | ICD-10-CM | POA: Diagnosis present

## 2020-05-18 DIAGNOSIS — Z7901 Long term (current) use of anticoagulants: Secondary | ICD-10-CM | POA: Diagnosis not present

## 2020-05-18 DIAGNOSIS — Z743 Need for continuous supervision: Secondary | ICD-10-CM | POA: Diagnosis not present

## 2020-05-18 DIAGNOSIS — R4781 Slurred speech: Secondary | ICD-10-CM | POA: Diagnosis not present

## 2020-05-18 DIAGNOSIS — E1122 Type 2 diabetes mellitus with diabetic chronic kidney disease: Secondary | ICD-10-CM | POA: Diagnosis present

## 2020-05-18 DIAGNOSIS — I6523 Occlusion and stenosis of bilateral carotid arteries: Secondary | ICD-10-CM | POA: Diagnosis not present

## 2020-05-18 DIAGNOSIS — R8271 Bacteriuria: Secondary | ICD-10-CM | POA: Diagnosis present

## 2020-05-18 HISTORY — DX: Hemiplegia, unspecified affecting right dominant side: G81.91

## 2020-05-18 LAB — LACTIC ACID, PLASMA: Lactic Acid, Venous: 1.7 mmol/L (ref 0.5–1.9)

## 2020-05-18 LAB — DIFFERENTIAL
Abs Immature Granulocytes: 0.03 10*3/uL (ref 0.00–0.07)
Basophils Absolute: 0 10*3/uL (ref 0.0–0.1)
Basophils Relative: 0 %
Eosinophils Absolute: 0.1 10*3/uL (ref 0.0–0.5)
Eosinophils Relative: 1 %
Immature Granulocytes: 0 %
Lymphocytes Relative: 33 %
Lymphs Abs: 2.5 10*3/uL (ref 0.7–4.0)
Monocytes Absolute: 0.5 10*3/uL (ref 0.1–1.0)
Monocytes Relative: 7 %
Neutro Abs: 4.5 10*3/uL (ref 1.7–7.7)
Neutrophils Relative %: 59 %

## 2020-05-18 LAB — COMPREHENSIVE METABOLIC PANEL
ALT: 17 U/L (ref 0–44)
AST: 18 U/L (ref 15–41)
Albumin: 3 g/dL — ABNORMAL LOW (ref 3.5–5.0)
Alkaline Phosphatase: 81 U/L (ref 38–126)
Anion gap: 9 (ref 5–15)
BUN: 17 mg/dL (ref 8–23)
CO2: 20 mmol/L — ABNORMAL LOW (ref 22–32)
Calcium: 8.7 mg/dL — ABNORMAL LOW (ref 8.9–10.3)
Chloride: 106 mmol/L (ref 98–111)
Creatinine, Ser: 1.45 mg/dL — ABNORMAL HIGH (ref 0.61–1.24)
GFR, Estimated: 51 mL/min — ABNORMAL LOW (ref >60)
Glucose, Bld: 129 mg/dL — ABNORMAL HIGH (ref 70–99)
Potassium: 4.1 mmol/L (ref 3.5–5.1)
Sodium: 135 mmol/L (ref 135–145)
Total Bilirubin: 0.8 mg/dL (ref 0.3–1.2)
Total Protein: 6.1 g/dL — ABNORMAL LOW (ref 6.5–8.1)

## 2020-05-18 LAB — URINALYSIS, ROUTINE W REFLEX MICROSCOPIC
Bilirubin Urine: NEGATIVE
Glucose, UA: NEGATIVE mg/dL
Ketones, ur: NEGATIVE mg/dL
Nitrite: POSITIVE — AB
Protein, ur: 30 mg/dL — AB
Specific Gravity, Urine: 1.027 (ref 1.005–1.030)
WBC, UA: >50 WBC/hpf — ABNORMAL HIGH (ref 0–5)
pH: 5 (ref 5.0–8.0)

## 2020-05-18 LAB — CBC
HCT: 34.3 % — ABNORMAL LOW (ref 39.0–52.0)
Hemoglobin: 10.6 g/dL — ABNORMAL LOW (ref 13.0–17.0)
MCH: 28.9 pg (ref 26.0–34.0)
MCHC: 30.9 g/dL (ref 30.0–36.0)
MCV: 93.5 fL (ref 80.0–100.0)
Platelets: 162 10*3/uL (ref 150–400)
RBC: 3.67 MIL/uL — ABNORMAL LOW (ref 4.22–5.81)
RDW: 16.3 % — ABNORMAL HIGH (ref 11.5–15.5)
WBC: 7.6 10*3/uL (ref 4.0–10.5)
nRBC: 0 % (ref 0.0–0.2)

## 2020-05-18 LAB — I-STAT CHEM 8, ED
BUN: 20 mg/dL (ref 8–23)
Calcium, Ion: 0.97 mmol/L — ABNORMAL LOW (ref 1.15–1.40)
Chloride: 105 mmol/L (ref 98–111)
Creatinine, Ser: 1.4 mg/dL — ABNORMAL HIGH (ref 0.61–1.24)
Glucose, Bld: 126 mg/dL — ABNORMAL HIGH (ref 70–99)
HCT: 32 % — ABNORMAL LOW (ref 39.0–52.0)
Hemoglobin: 10.9 g/dL — ABNORMAL LOW (ref 13.0–17.0)
Potassium: 4.1 mmol/L (ref 3.5–5.1)
Sodium: 134 mmol/L — ABNORMAL LOW (ref 135–145)
TCO2: 21 mmol/L — ABNORMAL LOW (ref 22–32)

## 2020-05-18 LAB — PROTIME-INR
INR: 1.6 — ABNORMAL HIGH (ref 0.8–1.2)
Prothrombin Time: 19.2 seconds — ABNORMAL HIGH (ref 11.4–15.2)

## 2020-05-18 LAB — RESP PANEL BY RT-PCR (FLU A&B, COVID) ARPGX2
Influenza A by PCR: NEGATIVE
Influenza B by PCR: NEGATIVE
SARS Coronavirus 2 by RT PCR: NEGATIVE

## 2020-05-18 LAB — APTT: aPTT: 33 seconds (ref 24–36)

## 2020-05-18 LAB — CBG MONITORING, ED: Glucose-Capillary: 115 mg/dL — ABNORMAL HIGH (ref 70–99)

## 2020-05-18 LAB — TROPONIN I (HIGH SENSITIVITY): Troponin I (High Sensitivity): 4 ng/L (ref <18)

## 2020-05-18 MED ORDER — SODIUM CHLORIDE 0.9% FLUSH
3.0000 mL | Freq: Once | INTRAVENOUS | Status: DC
Start: 2020-05-18 — End: 2020-05-24

## 2020-05-18 MED ORDER — VITAMIN B-12 1000 MCG PO TABS
1000.0000 ug | ORAL_TABLET | Freq: Every day | ORAL | Status: DC
  Administered 2020-05-19: 1000 ug via ORAL
  Filled 2020-05-18: qty 1

## 2020-05-18 MED ORDER — THIAMINE HCL 100 MG/ML IJ SOLN
100.0000 mg | Freq: Every day | INTRAMUSCULAR | Status: DC
  Administered 2020-05-19: 100 mg via INTRAVENOUS
  Filled 2020-05-18 (×2): qty 2

## 2020-05-18 MED ORDER — DULOXETINE HCL 30 MG PO CPEP
30.0000 mg | ORAL_CAPSULE | Freq: Every day | ORAL | Status: DC
  Administered 2020-05-18 – 2020-05-23 (×6): 30 mg via ORAL
  Filled 2020-05-18 (×7): qty 1

## 2020-05-18 MED ORDER — ACETAMINOPHEN 650 MG RE SUPP: 650.0000 mg | Freq: Four times a day (QID) | RECTAL | Status: DC | PRN

## 2020-05-18 MED ORDER — IOHEXOL 350 MG/ML SOLN
75.0000 mL | Freq: Once | INTRAVENOUS | Status: AC | PRN
  Administered 2020-05-18: 75 mL via INTRAVENOUS

## 2020-05-18 MED ORDER — ACETAMINOPHEN 325 MG PO TABS
650.0000 mg | ORAL_TABLET | Freq: Four times a day (QID) | ORAL | Status: DC | PRN
  Administered 2020-05-22: 650 mg via ORAL
  Filled 2020-05-18: qty 2

## 2020-05-18 MED ORDER — RIVAROXABAN 15 MG PO TABS
15.0000 mg | ORAL_TABLET | Freq: Every day | ORAL | Status: DC
  Filled 2020-05-18: qty 1

## 2020-05-18 MED ORDER — FAMOTIDINE 20 MG PO TABS: 20.0000 mg | ORAL_TABLET | Freq: Every day | ORAL | Status: DC | PRN

## 2020-05-18 MED ORDER — CITALOPRAM HYDROBROMIDE 40 MG PO TABS
40.0000 mg | ORAL_TABLET | Freq: Every day | ORAL | Status: DC
  Administered 2020-05-19 – 2020-05-23 (×5): 40 mg via ORAL
  Filled 2020-05-18 (×4): qty 1
  Filled 2020-05-18: qty 4

## 2020-05-18 MED ORDER — FINASTERIDE 5 MG PO TABS
5.0000 mg | ORAL_TABLET | Freq: Every day | ORAL | Status: DC
  Administered 2020-05-18 – 2020-05-23 (×6): 5 mg via ORAL
  Filled 2020-05-18 (×7): qty 1

## 2020-05-18 MED ORDER — TAMSULOSIN HCL 0.4 MG PO CAPS
0.4000 mg | ORAL_CAPSULE | Freq: Every day | ORAL | Status: DC
  Administered 2020-05-19 – 2020-05-23 (×5): 0.4 mg via ORAL
  Filled 2020-05-18 (×5): qty 1

## 2020-05-18 MED ORDER — ATORVASTATIN CALCIUM 40 MG PO TABS
40.0000 mg | ORAL_TABLET | Freq: Every day | ORAL | Status: DC
  Administered 2020-05-19: 40 mg via ORAL
  Filled 2020-05-18: qty 1

## 2020-05-18 NOTE — ED Notes (Addendum)
EEG being performed bedside.

## 2020-05-18 NOTE — Consult Note (Addendum)
Neurology consult   CC: Code stroke  History is obtained from: EMS, family, chart  HPI: Brian Burns is a 75 yo male with a PMHx of HTN, stroke, vascular dementia, AAA, depression, anxiety, AF on Xarelto, DM II with complication of neuropathy, tobacco abuse (quit in 2001), CAD, CKD III, PVD with claudication, and GERD. Patient lives at [facility]. His family visited during lunch and after lunch, patient had vomiting x 4. He then exhibited right sided facial droop, weakness, and dysarthria. Staff called 911. LKW per EMS 1450.   After brief exam and airway clearance at the ED bridge, patient was taken emergently to CT suite. CTH showed no acute abnormality with extensive intracranial atherosclerotic calcification. CTA head and neck showed no LVO but areas of severe stenosis.   In review of chart, last visit here in 03/2020 for n/v and weakness.  Patient was not felt to meet criteria for inpatient admission but was discharged to rehab to assist in getting his strength.  He was noted to have had a 40 pound weight loss and at that time was vomiting once per day after eating although not after every meal.  Last PCP visit 03/29/20 with continued gait disturbance and wheelchair was ordered. Note on that visit confirm vascular dementia. On note from NP in the SNF on 06/15/19, seizures were mentioned during an admission for sepsis in hospitalization 05/25/19 to 06/01/19. No AEDs were prescribed and family did not want further workup.   LKW: 1450 but sounds more like noon. tpa given?: No, patient on AC.  IR Thrombectomy? No, no LVO MRS 4-lives in SNF  NIHSS:  1a Level of Conscious: 0 1b LOC Questions: 2 1c LOC Commands: 0 2 Best Gaze: 0 3 Visual: 0 4 Facial Palsy: 1 5a Motor Arm - left: 0 5b Motor Arm - Right: 0 6a Motor Leg - Left: 0 6b Motor Leg - Right: 1 7 Limb Ataxia: 0 8 Sensory: 0 9 Best Language: 0 10 Dysarthria: 1 11 Extinct. and Inatten: 0 TOTAL:  5  ROS: + vomiting today per family.  Unable to perform robust ROS due to mental status.   Past Medical History:  Diagnosis Date  . AAA (abdominal aortic aneurysm) without rupture (HCC) 06/2014   no change in last exam from 2015  . Achalasia   . Anxiety   . Arthritis    knees,lower back  . Atrial fibrillation (HCC)   . Atrial flutter with rapid ventricular response (HCC) 01/16/2015  . Bilateral wrist pain 09/09/2014  . Bladder outlet obstruction 01/20/2015  . Bradycardia 08/27/2016  . CAD (coronary artery disease)    pt denies  . Carotid artery disease (HCC)   . Chronic back pain   . Chronic kidney disease (CKD), stage III (moderate) (HCC)   . Decreased visual acuity of left eye  12/02/2018  . Depression   . Dyspnea    reports today no pregoression  . Erectile dysfunction   . GERD (gastroesophageal reflux disease)   . Headache    hx of  . History of CVA (cerebrovascular accident)    2009  &  2013  . History of Multiple lacunar infarcts (HCC) 01/31/2015   MRI Brain 01/31/2015: Widespread old lacunar infarcts, particularly right Basal ganglia, pons, and right cerebellum  . HLD (hyperlipidemia)   . Hypertension   . Idiopathic pancreatitis 12/2014   acute  . Knee pain, bilateral 04/09/2011  . Malnutrition of moderate degree 01/25/2015  . Memory impairment    mild dementia  per PCP progress note   . Morbid obesity (HCC) 12/03/2018  . Other abnormalities of gait and mobility 12/03/2018  . PVD (peripheral vascular disease) with claudication (HCC)   . Urethral stricture 02/16/2013  . Urge incontinence 12/08/2018    Family History  Problem Relation Age of Onset  . Lung disease Brother   . Kidney disease Brother   . Heart disease Brother        died at 24  . Heart disease Mother        died at 36  . Kidney disease Mother   . Lung disease Mother     Social History:  reports that he quit smoking about 21 years ago. His smoking use included cigarettes. He has a 20.00 pack-year smoking history. He has never used smokeless  tobacco. He reports that he does not drink alcohol and does not use drugs.   Prior to Admission medications   Medication Sig Start Date End Date Taking? Authorizing Provider  acetaminophen (TYLENOL) 325 MG tablet Take 325 mg by mouth every 6 (six) hours as needed for moderate pain.    [provider]  atorvastatin (LIPITOR) 40 MG tablet TAKE 1 TABLET(40 MG) BY MOUTH DAILY 04/13/20   Mullis, Kiersten P, DO  Cholecalciferol (VITAMIN D) 50 MCG (2000 UT) tablet Take 2,000 Units by mouth daily.    [provider]  citalopram (CELEXA) 40 MG tablet Take 40 mg by mouth daily. 10/26/19   [provider]  DULoxetine (CYMBALTA) 30 MG capsule Take 30 mg by mouth at bedtime. 02/01/20   [provider]  DULoxetine (CYMBALTA) 60 MG capsule Take 1 capsule (60 mg total) by mouth at bedtime. For back pain/nerve pain Patient not taking: No sig reported 11/29/19   Lovorn, Aundra Millet, MD  famotidine (PEPCID) 20 MG tablet Take 1 tablet (20 mg total) by mouth daily. 04/05/20   Caccavale, Sophia, PA-C  ferrous sulfate 324 MG TBEC Take 1 tablet (324 mg total) by mouth daily with breakfast. Patient not taking: No sig reported 06/15/19   Medina-Vargas, Monina C, NP  finasteride (PROSCAR) 5 MG tablet Take 1 tablet (5 mg total) by mouth at bedtime. 06/15/19   Medina-Vargas, Monina C, NP  lipase/protease/amylase (CREON) 12000-38000 units CPEP capsule TAKE 1 CAPSULE BY MOUTH THREE TIMES DAILY WITH MEALS 01/24/20   Mullis, Kiersten P, DO  lisinopril (ZESTRIL) 10 MG tablet Take 10 mg by mouth at bedtime. 11/16/19   [provider]  lisinopril (ZESTRIL) 5 MG tablet Take 1 tablet (5 mg total) by mouth at bedtime. Patient not taking: No sig reported 06/30/19   Jackelyn Poling, DO  nitrofurantoin, macrocrystal-monohydrate, (MACROBID) 100 MG capsule Take 100 mg by mouth 2 (two) times daily. 03/30/20   [provider]  potassium chloride (KLOR-CON) 10 MEQ tablet Take 1 tablet (10 mEq total) by mouth  daily. 04/05/20   Caccavale, Sophia, PA-C  tamsulosin (FLOMAX) 0.4 MG CAPS capsule TAKE 1 CAPSULE(0.4 MG) BY MOUTH DAILY Patient taking differently: Take 0.4 mg by mouth daily. 06/15/19   Medina-Vargas, Monina C, NP  traMADol (ULTRAM) 50 MG tablet Take 1 tablet (50 mg total) by mouth 3 (three) times daily as needed for moderate pain. 01/31/20   Lovorn, Megan, MD  XARELTO 15 MG TABS tablet TAKE 1 TABLET(15 MG) BY MOUTH TWICE DAILY WITH A MEAL Patient taking differently: Take 15 mg by mouth at bedtime. 03/13/20   Joana Reamer, DO    Exam: Current vital signs: BP 126/83   Pulse  61   Temp 97.9 F (36.6 C) (Oral)   Resp 12   Wt 77.7 kg   SpO2 99%   BMI 20.85 kg/m   Physical Exam  Constitutional: Appears well-developed and well-nourished.  Psych: Affect appropriate to situation Eyes: No scleral injection HENT: No OP obstrucion Head: Normocephalic.  Cardiovascular: Normal rate and regular rhythm. Occasional PVC.  Respiratory: Effort normal. GI: Soft.  No distension. There is no tenderness.  Skin: WDI  Neuro: Mental Status: Patient is awake, alert, oriented to self and place and no other.  Patient is unable to give a clear and coherent history. No signs of aphasia or neglect. Speech/Language: + dysarthria. Is able to repeat today is a sunny day, but not no ifs, ands or butts about it.  Cranial Nerves: II: Visual Fields are full. Pupils are equal, round, and reactive to light-25mm. Arcus senilinis.   III,IV, VI: EOMI without ptosis or diploplia.  V: Facial sensation is symmetric to light touch.  VII: Slight right facial droop that resolves by making patient smile 2 times in a row. VIII: hearing is intact to voice X: Uvula elevates symmetrically XI: Shoulder shrug is symmetric. XII: tongue is midline without atrophy or fasciculations.  Motor: Tone is normal. Bulk is normal. 4/5 UEs, 5/5 LEs Sensory: Sensation is symmetric to light touch in the arms and legs. No extinction to  light touch with DSS.  Deep Tendon Reflexes: RUE 3+   LUE 2+    RLE 2+    LLE 2+   Plantars: Toes are downgoing bilaterally.  Cerebellar: FNF intact bilaterally. Unable to perform HKS, likely due to back problems and lack of understanding of instructions.   I have reviewed labs in epic and the pertinent results are: INR  1.6   TSH 3.7 03/29/20    HbA1c 5.7 03/29/20   MD reviewed the images obtained:  NCT head  1. No acute abnormality 2. ASPECTS is 10 3. Moderate atrophy and chronic microvascular ischemic change. No change from the prior study. 4. Extensive intracranial atherosclerotic calcification.  CTA neck: 1. The bilateral common and internal carotid arteries are patent within the neck without hemodynamically significant stenosis. Mild atherosclerotic disease within the carotid systems as described. 2. Vertebral arteries patent within the neck. Moderate atherosclerotic narrowing at the origin of the dominant right vertebral artery. 3. Partially imaged patulous and fluid-filled esophagus.  CTA head: 1. Intracranial atherosclerotic disease with multifocal stenoses, most notably as follows. 2. Severe atherosclerotic disease within the intracranial left vertebral artery with sites of occlusive or near occlusive stenosis. 3. Severe atherosclerotic disease within the intracranial right vertebral artery with sites of severe stenosis. 4. Multifocal stenoses within the bilateral posterior cerebral arteries. Most notably, there is a severe stenosis within the P3 right PCA. Moderate stenosis within the left PCA at the P1/P2 junction. Severe stenosis within the P4 left PCA. 5. Sites of severe stenosis within a proximal to mid right M2 MCA branch. 6. Sites of moderate to moderately severe stenosis within proximal to mid left M2 MCA branches. 7. Up to moderate stenosis within the cavernous/paraclinoid right ICA.   Assessment: 75 yo male with multiple co morbidities with stroke  risk factors of prior stroke, HTN, DM II, atherosclerosis including intracranial, and HLD. Patient presented as a Code stroke after vomiting at SNF and acute onset of right facial droop and weakness. + HA and dizziness as well. CT with nothing acute. CTA head and neck showed a great amount of stenosis, but no obstruction  of vessels. tPA was not given due to Xarelto. IR not needed due to no acute LVO (left vertebral severe stenosis versus occlusion felt to be chronic given resolution of his symptoms rapidly). Given history of seizure in the past, will check an EEG as well as stroke/TIA workup. In ED, his strength was coming back and facial droop was less prominent.    Differential diagnosis is recrudescence in the setting of transient hypotension versus TIA or small stroke  Plan: - Medicine admit - MRI brain without contrast. - TTE. - Does not need HbA1c or TSH as was checked a month ago. Fasting lipid panel. - Increase statin dose if LDL over 70.  - Continue home dose of Xarelto per pharmacy consult  -at this time would not add antiplatelet agents to his Xarelto though this may change pending work-up as above - SBP goal - Permissive hypertension first 24 h < 220/110. Hold home medications for now. -neuro checks and NIHSS per protocol.  - Telemetry monitoring for arrhythmia. - bedside Swallow screen. - Stroke education. - PT/OT/SLP consult. - rEEG to eval for any epileptogenic discharges. - B12 and thiamine levels given elevated RDW and nutritional deficiencies to screen for reversible causes of dementia - empirically start 100 mg IV thiamine daily (transition to p.o. on discharge) and 1000 mcg PO B12 daily, may be discontinued if levels returned normal - Additional metabolic/infectious workup per primary team  - Work-up of emesis and weight loss per primary team - Stroke team to follow   Electronically signed by: Brian Norman, MSN, APN-BC, nurse practitioner and by MD. Note/plan to be  edited by MD as needed.  Pager: [phone]  Attending Neurologist's note:  I personally saw this patient, gathering history, performing a full neurologic examination, reviewing relevant labs, personally reviewing relevant imaging including head CT, CTA, and formulated the assessment and plan, adding the note above for completeness and clarity to accurately reflect my thoughts  Brian Dare MD-PhD [facility] [phone]

## 2020-05-18 NOTE — ED Triage Notes (Signed)
Pt bib ems from [place] with sudden onset R sided facial droop, weakness and dysarthria. LKW 1450. Code stroke activated en route. BP 96/50, given 500 cc NS en route.

## 2020-05-18 NOTE — ED Notes (Signed)
Assuming care. Pt laying in bed @ this time w/ NAD noted. Alert to name, birthday, year. When asked where he was stated [place] in [place]. Noted slurred speech. Swallow study performed and pt passed. Per daughter ot has always had swallowing issues. Daughter updated on plan of care. VSS. Cardiac monitoring in place w/ bp cycling and spo2 being monnitored. Deny needs or concerns @ this time. Bed low and locked. Side rails up x2,

## 2020-05-18 NOTE — Code Documentation (Signed)
Stroke Response Nurse Documentation Code Documentation  Brian Burns is a 75 y.o. male arriving to [facility] ED via [facility] on 05/18/2020 with past medical hx of HTN, HLP, dementia, CVA, Afib, CKD. Code stroke was activated by EMS. Patient from [place] where he was LKW at 1450 and now complaining of right sided weakness and slurred speech. Patient was with family at 48 when he had an episode where eyes rolled back in his head and he became slightly unresponsive. EMS called and when they arrived patient had right facial droop and right weakness with slurred speech. Pt started to get better during transport, but he arrived with right facial droop, right mild leg weakness, and confusion (baseline).   Stroke team at the bedside on patient arrival. Labs drawn and patient cleared for CT by EDP. Patient to CT with team. NIHSS 5, see documentation for details and code stroke times. Patient with disoriented, right facial droop, right leg weakness and dysarthria  on exam. The following imaging was completed:  CT, CTA head and neck. Patient is not a candidate for tPA due to being on Xarelto. Care/Plan: q2 mNIHSS/VS and MRI. Bedside handoff with ED RN Ileene Hutchinson  Stroke Response RN

## 2020-05-18 NOTE — ED Provider Notes (Signed)
[facility] EMERGENCY DEPARTMENT Provider Note   CSN: [id number] Arrival date & time: 05/18/20  1626  An emergency department physician performed an initial assessment on this suspected stroke patient at 1629.  History Chief Complaint  Patient presents with  . Code Stroke    Brian Burns is a 75 y.o. male.  74 year old male with prior medical history as detailed below presents for evaluation.  Patient with cute onset of slurred speech, right facial droop, right sided weakness.  Symptoms began around 250 this afternoon.  Patient arrived as a code stroke.  Patient seen on arrival by the neuro team.  Patient is not candidate for tPA given his concurrent use of Xarelto.   The history is provided by the patient and medical records.  Illness Location:  Acute onset at 1450 of neurologic change  Severity:  Moderate Onset quality:  Sudden Timing:  Constant Progression:  Unchanged Chronicity:  New      Past Medical History:  Diagnosis Date  . AAA (abdominal aortic aneurysm) without rupture (HCC) 06/2014   no change in last exam from 2015  . Achalasia   . Anxiety   . Arthritis    knees,lower back  . Atrial fibrillation (HCC)   . Atrial flutter with rapid ventricular response (HCC) 01/16/2015  . Bilateral wrist pain 09/09/2014  . Bladder outlet obstruction 01/20/2015  . Bradycardia 08/27/2016  . CAD (coronary artery disease)    pt denies  . Carotid artery disease (HCC)   . Chronic back pain   . Chronic kidney disease (CKD), stage III (moderate) (HCC)   . Decreased visual acuity of left eye  12/02/2018  . Depression   . Dyspnea    reports today no pregoression  . Erectile dysfunction   . GERD (gastroesophageal reflux disease)   . Headache    hx of  . History of CVA (cerebrovascular accident)    2009  &  2013  . History of Multiple lacunar infarcts (HCC) 01/31/2015   MRI Brain 01/31/2015: Widespread old lacunar infarcts, particularly right Basal ganglia,  pons, and right cerebellum  . HLD (hyperlipidemia)   . Hypertension   . Idiopathic pancreatitis 12/2014   acute  . Knee pain, bilateral 04/09/2011  . Malnutrition of moderate degree 01/25/2015  . Memory impairment    mild dementia per PCP progress note   . Morbid obesity (HCC) 12/03/2018  . Other abnormalities of gait and mobility 12/03/2018  . PVD (peripheral vascular disease) with claudication (HCC)   . Urethral stricture 02/16/2013  . Urge incontinence 12/08/2018    Patient Active Problem List   Diagnosis Date Noted  . Abnormality of gait 09/27/2019  . Seizure (HCC)   . Urge incontinence 12/08/2018  . Stenosis of lateral recess of lumbar spine 12/08/2018  . Dementia, vascular (HCC) 12/08/2018  . Decreased visual acuity of left eye  12/02/2018  . Gross hematuria 10/29/2018  . Other abnormalities of gait and mobility 09/02/2018  . Muscle weakness (generalized) 04/08/2018  . Lumbar spinal stenosis 04/05/2018  . Chronic pain of right knee 03/07/2017  . Chronic low back pain without sciatica 03/07/2017  . Nonischemic cardiomyopathy (HCC) 09/25/2016  . Microalbuminuria 07/26/2015  . Paroxysmal atrial fibrillation (HCC)   . Coronary artery disease involving native coronary artery of native heart without angina pectoris   . Stricture, urethra   . CKD stage 3 due to type 2 diabetes mellitus (HCC) 03/16/2015  . Depression 03/16/2015  . Normocytic anemia   . History of  Multiple lacunar infarcts (HCC) 01/31/2015  . Weight loss 03/18/2014  . GERD (gastroesophageal reflux disease) 10/21/2013  . AAA (abdominal aortic aneurysm) without rupture (HCC) 06/24/2013  . PVD (peripheral vascular disease) (HCC) 06/24/2013  . Orthostatic dizziness 03/18/2013  . Hearing loss of both ears 03/18/2013  . BPH (benign prostatic hyperplasia) 02/16/2013  . Type 2 diabetes mellitus with diabetic neuropathy, unspecified (HCC) 05/10/2012  . Type 2 diabetes mellitus with hypercholesterolemia (HCC) 02/05/2007   . Hypertension associated with diabetes (HCC) 03/27/2006    Past Surgical History:  Procedure Laterality Date  . back injections     [facility]  . BALLOON DILATION N/A 12/16/2018   Procedure: URETHRAL BALLOON DILATION;  Surgeon: Crist Fat, MD;  Location: [facility];  Service: Urology;  Laterality: N/A;  . CARDIOVASCULAR STRESS TEST  08-09-2009   mild global hypokinesis,  no ischemia or scar/  ef 41%  . CATARACT EXTRACTION W/ INTRAOCULAR LENS  IMPLANT, BILATERAL  april and may 2014  . CYSTOSCOPY N/A 01/17/2017   Procedure: CYSTOSCOPY FLEXIBLE;  Surgeon: Crist Fat, MD;  Location: [facility];  Service: Urology;  Laterality: N/A;  . CYSTOSCOPY WITH RETROGRADE URETHROGRAM N/A 11/24/2014   Procedure: CYSTOSCOPY WITH RETROGRADE URETHROGRAM;  Surgeon: Crist Fat, MD;  Location: [facility];  Service: Urology;  Laterality: N/A;  . CYSTOSCOPY WITH RETROGRADE URETHROGRAM N/A 12/16/2018   Procedure: CYSTOSCOPY WITH RETROGRADE URETHROGRAM;  Surgeon: Crist Fat, MD;  Location: [facility];  Service: Urology;  Laterality: N/A;  . CYSTOSCOPY WITH RETROGRADE URETHROGRAM N/A 12/16/2019   Procedure: CYSTOSCOPY WITH RETROGRADE, [facility] [employer] Internal Urethrotomy;  Surgeon: Crist Fat, MD;  Location: [facility];  Service: Urology;  Laterality: N/A;  . CYSTOSCOPY WITH URETHRAL DILATATION N/A 11/24/2014   Procedure: CYSTOSCOPY WITH URETHRAL DILATATION;  Surgeon: Crist Fat, MD;  Location: [facility];  Service: Urology;  Laterality: N/A;  BALLOON DILATION        . ESOPHAGOGASTRODUODENOSCOPY N/A 05/15/2014   Procedure: ESOPHAGOGASTRODUODENOSCOPY (EGD);  Surgeon: Carman Ching, MD;  Location: [facility];  Service: Endoscopy;  Laterality: N/A;  . ESOPHAGOGASTRODUODENOSCOPY Left 02/02/2015   Procedure: ESOPHAGOGASTRODUODENOSCOPY (EGD);  Surgeon: Dorena Cookey, MD;  Location: [facility]COPY;  Service: Endoscopy;  Laterality: Left;  .  ESOPHAGOGASTRODUODENOSCOPY Left 03/16/2015   Procedure: ESOPHAGOGASTRODUODENOSCOPY (EGD);  Surgeon: Dorena Cookey, MD;  Location: [facility];  Service: Endoscopy;  Laterality: Left;  . TRANSTHORACIC ECHOCARDIOGRAM  01-07-2012   mild to moderate dilated LV,  ef 45-50%,  mild basal hypokinesis,  grade I diastolic  dysfunction/  trivial AR/  mild MR  . URETHROPLASTY N/A 01/17/2017   Procedure: URETHEROPLASTY BUCCAL GRAFT AND BUCCAL MUCOSA GRAFT HARVEST;  Surgeon: Crist Fat, MD;  Location: [facility];  Service: Urology;  Laterality: N/A;       Family History  Problem Relation Age of Onset  . Lung disease Brother   . Kidney disease Brother   . Heart disease Brother        died at 77  . Heart disease Mother        died at 75  . Kidney disease Mother   . Lung disease Mother     Social History   Tobacco Use  . Smoking status: Former Smoker    Packs/day: 0.50    Years: 40.00    Pack years: 20.00    Types: Cigarettes    Quit date: 01/29/1999    Years since quitting: 21.3  . Smokeless tobacco: Never Used  Vaping  Use  . Vaping Use: Never used  Substance Use Topics  . Alcohol use: Never    Alcohol/week: 0.0 standard drinks  . Drug use: Never    Home Medications Prior to Admission medications   Medication Sig Start Date End Date Taking? Authorizing Provider  acetaminophen (TYLENOL) 325 MG tablet Take 325 mg by mouth every 6 (six) hours as needed for moderate pain.    [provider]  atorvastatin (LIPITOR) 40 MG tablet TAKE 1 TABLET(40 MG) BY MOUTH DAILY 04/13/20   Mullis, Kiersten P, DO  Cholecalciferol (VITAMIN D) 50 MCG (2000 UT) tablet Take 2,000 Units by mouth daily.    [provider]  citalopram (CELEXA) 40 MG tablet Take 40 mg by mouth daily. 10/26/19   [provider]  DULoxetine (CYMBALTA) 30 MG capsule Take 30 mg by mouth at bedtime. 02/01/20   [provider]  DULoxetine (CYMBALTA) 60 MG capsule Take 1 capsule (60 mg total) by mouth at  bedtime. For back pain/nerve pain Patient not taking: No sig reported 11/29/19   Lovorn, Aundra Millet, MD  famotidine (PEPCID) 20 MG tablet Take 1 tablet (20 mg total) by mouth daily. 04/05/20   Caccavale, Sophia, PA-C  ferrous sulfate 324 MG TBEC Take 1 tablet (324 mg total) by mouth daily with breakfast. Patient not taking: No sig reported 06/15/19   Medina-Vargas, Monina C, NP  finasteride (PROSCAR) 5 MG tablet Take 1 tablet (5 mg total) by mouth at bedtime. 06/15/19   Medina-Vargas, Monina C, NP  lipase/protease/amylase (CREON) 12000-38000 units CPEP capsule TAKE 1 CAPSULE BY MOUTH THREE TIMES DAILY WITH MEALS 01/24/20   Mullis, Kiersten P, DO  lisinopril (ZESTRIL) 10 MG tablet Take 10 mg by mouth at bedtime. 11/16/19   [provider]  lisinopril (ZESTRIL) 5 MG tablet Take 1 tablet (5 mg total) by mouth at bedtime. Patient not taking: No sig reported 06/30/19   Jackelyn Poling, DO  nitrofurantoin, macrocrystal-monohydrate, (MACROBID) 100 MG capsule Take 100 mg by mouth 2 (two) times daily. 03/30/20   [provider]  potassium chloride (KLOR-CON) 10 MEQ tablet Take 1 tablet (10 mEq total) by mouth daily. 04/05/20   Caccavale, Sophia, PA-C  tamsulosin (FLOMAX) 0.4 MG CAPS capsule TAKE 1 CAPSULE(0.4 MG) BY MOUTH DAILY Patient taking differently: Take 0.4 mg by mouth daily. 06/15/19   Medina-Vargas, Monina C, NP  traMADol (ULTRAM) 50 MG tablet Take 1 tablet (50 mg total) by mouth 3 (three) times daily as needed for moderate pain. 01/31/20   Lovorn, Megan, MD  XARELTO 15 MG TABS tablet TAKE 1 TABLET(15 MG) BY MOUTH TWICE DAILY WITH A MEAL Patient taking differently: Take 15 mg by mouth at bedtime. 03/13/20   Mullis, Kiersten P, DO    Allergies    Gabapentin  Review of Systems   Review of Systems  Unable to perform ROS: Acuity of condition    Physical Exam Updated Vital Signs BP 139/84   Pulse (!) 56   Temp 97.9 F (36.6 C) (Oral)   Resp 13   Wt 77.7 kg   SpO2 99%   BMI 20.85 kg/m    Physical Exam Vitals and nursing note reviewed.  Constitutional:      General: He is not in acute distress.    Appearance: Normal appearance. He is well-developed.  HENT:     Head: Normocephalic and atraumatic.  Eyes:     Conjunctiva/sclera: Conjunctivae normal.     Pupils: Pupils are equal, round, and reactive to light.  Cardiovascular:  Rate and Rhythm: Normal rate and regular rhythm.     Heart sounds: Normal heart sounds.  Pulmonary:     Effort: Pulmonary effort is normal. No respiratory distress.     Breath sounds: Normal breath sounds.  Abdominal:     General: There is no distension.     Palpations: Abdomen is soft.     Tenderness: There is no abdominal tenderness.  Musculoskeletal:        General: No deformity. Normal range of motion.     Cervical back: Normal range of motion and neck supple.  Skin:    General: Skin is warm and dry.  Neurological:     Mental Status: He is alert.     Comments: Alert  Mild dysarthria  Mild right facial droop  LVO screen negative      ED Results / Procedures / Treatments   Labs (all labs ordered are listed, but only abnormal results are displayed) Labs Reviewed  PROTIME-INR - Abnormal; Notable for the following components:      Result Value   Prothrombin Time 19.2 (*)    INR 1.6 (*)    All other components within normal limits  CBC - Abnormal; Notable for the following components:   RBC 3.67 (*)    Hemoglobin 10.6 (*)    HCT 34.3 (*)    RDW 16.3 (*)    All other components within normal limits  COMPREHENSIVE METABOLIC PANEL - Abnormal; Notable for the following components:   CO2 20 (*)    Glucose, Bld 129 (*)    Creatinine, Ser 1.45 (*)    Calcium 8.7 (*)    Total Protein 6.1 (*)    Albumin 3.0 (*)    GFR, Estimated 51 (*)    All other components within normal limits  URINALYSIS, ROUTINE W REFLEX MICROSCOPIC - Abnormal; Notable for the following components:   Color, Urine AMBER (*)    APPearance CLOUDY (*)     Hgb urine dipstick MODERATE (*)    Protein, ur 30 (*)    Nitrite POSITIVE (*)    Leukocytes,Ua LARGE (*)    WBC, UA >50 (*)    Bacteria, UA MANY (*)    All other components within normal limits  I-STAT CHEM 8, ED - Abnormal; Notable for the following components:   Sodium 134 (*)    Creatinine, Ser 1.40 (*)    Glucose, Bld 126 (*)    Calcium, Ion 0.97 (*)    TCO2 21 (*)    Hemoglobin 10.9 (*)    HCT 32.0 (*)    All other components within normal limits  CBG MONITORING, ED - Abnormal; Notable for the following components:   Glucose-Capillary 115 (*)    All other components within normal limits  RESP PANEL BY RT-PCR (FLU A&B, COVID) ARPGX2  URINE CULTURE  CULTURE, BLOOD (ROUTINE X 2)  CULTURE, BLOOD (ROUTINE X 2)  APTT  DIFFERENTIAL  LACTIC ACID, PLASMA  VITAMIN B12  VITAMIN B1  TROPONIN I (HIGH SENSITIVITY)  TROPONIN I (HIGH SENSITIVITY)    EKG EKG Interpretation  Date/Time:  Thursday May 18 2020 16:59:03 EDT Ventricular Rate:  70 PR Interval:  151 QRS Duration: 112 QT Interval:  427 QTC Calculation: 461 R Axis:   66 Text Interpretation: Sinus rhythm Multiple ventricular premature complexes Borderline intraventricular conduction delay ST elevation, consider inferior injury Confirmed by Kristine Royal [phone]) on 05/18/2020 5:02:59 PM   Radiology CT HEAD CODE STROKE WO CONTRAST  Result Date: 05/18/2020 CLINICAL  DATA:  Code stroke.  Right-sided weakness. EXAM: CT HEAD WITHOUT CONTRAST TECHNIQUE: Contiguous axial images were obtained from the base of the skull through the vertex without intravenous contrast. COMPARISON:  CT head 04/05/2020 FINDINGS: Brain: Moderate atrophy. Moderate patchy white matter hypodensity bilaterally unchanged from the prior study. Negative for acute infarct, hemorrhage, mass Vascular: Negative for hyperdense vessel. Extensive atherosclerotic calcification in the carotid and vertebral arteries bilaterally. Skull: Negative Sinuses/Orbits: Paranasal  sinuses clear. Bilateral cataract extraction Other: None ASPECTS ([place] Stroke Program Early CT Score) - Ganglionic level infarction (caudate, lentiform nuclei, internal capsule, insula, M1-M3 cortex): 7 - Supraganglionic infarction (M4-M6 cortex): 3 Total score (0-10 with 10 being normal): 10 IMPRESSION: 1. No acute abnormality 2. ASPECTS is 10 3. Moderate atrophy and chronic microvascular ischemic change. No change from the prior study. 4. Extensive intracranial atherosclerotic calcification. 5. Code stroke imaging results were communicated on 05/18/2020 at 4:45 pm to provider Bhagat via text message Electronically Signed   By: Marlan Palau M.D.   On: 05/18/2020 16:46   CT ANGIO HEAD CODE STROKE  Result Date: 05/18/2020 CLINICAL DATA:  Neuro deficit, acute, stroke suspected. Additional history provided: Right-sided weakness. EXAM: CT ANGIOGRAPHY HEAD AND NECK TECHNIQUE: Multidetector CT imaging of the head and neck was performed using the standard protocol during bolus administration of intravenous contrast. Multiplanar CT image reconstructions and MIPs were obtained to evaluate the vascular anatomy. Carotid stenosis measurements (when applicable) are obtained utilizing NASCET criteria, using the distal internal carotid diameter as the denominator. CONTRAST:  75 mL Omnipaque 350 intravenous contrast. COMPARISON:  Noncontrast head CT performed earlier today 05/18/2020. FINDINGS: CTA NECK FINDINGS Aortic arch: Standard aortic branching. Atherosclerotic plaque within the visualized aortic arch and proximal major branch vessels of the neck. No hemodynamically significant innominate or proximal subclavian artery stenosis. Right carotid system: CCA and ICA patent within the neck without stenosis. Minimal calcified plaque within the carotid bifurcation. Left carotid system: CCA and ICA patent within the neck. Mild atherosclerotic plaque within the carotid bifurcation and proximal ICA with less than 50% stenosis of  the proximal ICA. Tortuosity of the cervical ICA. Vertebral arteries: Vertebral arteries patent within the neck. Calcified plaque results in moderate stenosis at the origin of the dominant right vertebral artery. Nonstenotic calcified plaque at the origin of the left vertebral artery. Skeleton: Cervical spondylosis. No acute bony abnormality or aggressive osseous lesion. Other neck: No neck mass or cervical lymphadenopathy. Thyroid unremarkable. Upper chest: No consolidation within the imaged lung apices. Partially imaged patulous and fluid-filled esophagus. Review of the MIP images confirms the above findings CTA HEAD FINDINGS Anterior circulation: The intracranial internal carotid arteries are patent. Calcified plaque within both vessels. Up to moderate stenosis within the cavernous/paraclinoid right ICA. Mild stenosis of the cavernous/paraclinoid left ICA. The M1 middle cerebral arteries are patent. No M2 proximal branch occlusion is identified. There is significant atherosclerotic irregularity of the M2 and more distal middle cerebral arteries bilaterally. This includes multiple sites of high-grade stenosis within a proximal to mid right M2 MCA branch (series 10, image 14). Sites of moderate to moderately severe stenosis are also present within proximal to mid left M2 MCA branches (for instance as seen on series 11, image 58) (series 10, image 29). The anterior cerebral arteries are patent. Dominant left anterior cerebral artery. No intracranial aneurysm is identified. Posterior circulation: The right vertebral artery is patent. Prominent soft and calcified plaque within the right vertebral artery with sites of up to severe stenosis. Severe atherosclerotic disease  within the intracranial left vertebral artery with sites of occlusive or near occlusive stenosis. The basilar artery is patent. The posterior cerebral arteries are patent. Atherosclerotic irregularity of the bilateral posterior cerebral arteries with  multifocal stenoses. Most notably, there is a severe stenosis within the right posterior cerebral artery at the P3 segment (series 10, image 19). Moderate stenosis within the left PCA at the P2/P3 junction. A severe stenosis is also present within the P4 left posterior cerebral artery (series 10, image 25). Posterior communicating arteries are hypoplastic or absent bilaterally. Venous sinuses: Poorly assessed due to contrast bolus timing Anatomic variants: As described Review of the MIP images confirms the above findings These results were called by telephone at the time of interpretation on 05/18/2020 at 5:20 pm to provider [facility] , who verbally acknowledged these results. IMPRESSION: CTA neck: 1. The bilateral common and internal carotid arteries are patent within the neck without hemodynamically significant stenosis. Mild atherosclerotic disease within the carotid systems as described. 2. Vertebral arteries patent within the neck. Moderate atherosclerotic narrowing at the origin of the dominant right vertebral artery. 3. Partially imaged patulous and fluid-filled esophagus. CTA head: 1. Intracranial atherosclerotic disease with multifocal stenoses, most notably as follows. 2. Severe atherosclerotic disease within the intracranial left vertebral artery with sites of occlusive or near occlusive stenosis. 3. Severe atherosclerotic disease within the intracranial right vertebral artery with sites of severe stenosis. 4. Multifocal stenoses within the bilateral posterior cerebral arteries. Most notably, there is a severe stenosis within the P3 right PCA. Moderate stenosis within the left PCA at the P1/P2 junction. Severe stenosis within the P4 left PCA. 5. Sites of severe stenosis within a proximal to mid right M2 MCA branch. 6. Sites of moderate to moderately severe stenosis within proximal to mid left M2 MCA branches. 7. Up to moderate stenosis within the cavernous/paraclinoid right ICA. Electronically Signed    By: Jackey LogeKyle  Golden DO   On: 05/18/2020 17:20   CT ANGIO NECK CODE STROKE  Result Date: 05/18/2020 CLINICAL DATA:  Neuro deficit, acute, stroke suspected. Additional history provided: Right-sided weakness. EXAM: CT ANGIOGRAPHY HEAD AND NECK TECHNIQUE: Multidetector CT imaging of the head and neck was performed using the standard protocol during bolus administration of intravenous contrast. Multiplanar CT image reconstructions and MIPs were obtained to evaluate the vascular anatomy. Carotid stenosis measurements (when applicable) are obtained utilizing NASCET criteria, using the distal internal carotid diameter as the denominator. CONTRAST:  75 mL Omnipaque 350 intravenous contrast. COMPARISON:  Noncontrast head CT performed earlier today 05/18/2020. FINDINGS: CTA NECK FINDINGS Aortic arch: Standard aortic branching. Atherosclerotic plaque within the visualized aortic arch and proximal major branch vessels of the neck. No hemodynamically significant innominate or proximal subclavian artery stenosis. Right carotid system: CCA and ICA patent within the neck without stenosis. Minimal calcified plaque within the carotid bifurcation. Left carotid system: CCA and ICA patent within the neck. Mild atherosclerotic plaque within the carotid bifurcation and proximal ICA with less than 50% stenosis of the proximal ICA. Tortuosity of the cervical ICA. Vertebral arteries: Vertebral arteries patent within the neck. Calcified plaque results in moderate stenosis at the origin of the dominant right vertebral artery. Nonstenotic calcified plaque at the origin of the left vertebral artery. Skeleton: Cervical spondylosis. No acute bony abnormality or aggressive osseous lesion. Other neck: No neck mass or cervical lymphadenopathy. Thyroid unremarkable. Upper chest: No consolidation within the imaged lung apices. Partially imaged patulous and fluid-filled esophagus. Review of the MIP images confirms the above findings  CTA HEAD FINDINGS  Anterior circulation: The intracranial internal carotid arteries are patent. Calcified plaque within both vessels. Up to moderate stenosis within the cavernous/paraclinoid right ICA. Mild stenosis of the cavernous/paraclinoid left ICA. The M1 middle cerebral arteries are patent. No M2 proximal branch occlusion is identified. There is significant atherosclerotic irregularity of the M2 and more distal middle cerebral arteries bilaterally. This includes multiple sites of high-grade stenosis within a proximal to mid right M2 MCA branch (series 10, image 14). Sites of moderate to moderately severe stenosis are also present within proximal to mid left M2 MCA branches (for instance as seen on series 11, image 58) (series 10, image 29). The anterior cerebral arteries are patent. Dominant left anterior cerebral artery. No intracranial aneurysm is identified. Posterior circulation: The right vertebral artery is patent. Prominent soft and calcified plaque within the right vertebral artery with sites of up to severe stenosis. Severe atherosclerotic disease within the intracranial left vertebral artery with sites of occlusive or near occlusive stenosis. The basilar artery is patent. The posterior cerebral arteries are patent. Atherosclerotic irregularity of the bilateral posterior cerebral arteries with multifocal stenoses. Most notably, there is a severe stenosis within the right posterior cerebral artery at the P3 segment (series 10, image 19). Moderate stenosis within the left PCA at the P2/P3 junction. A severe stenosis is also present within the P4 left posterior cerebral artery (series 10, image 25). Posterior communicating arteries are hypoplastic or absent bilaterally. Venous sinuses: Poorly assessed due to contrast bolus timing Anatomic variants: As described Review of the MIP images confirms the above findings These results were called by telephone at the time of interpretation on 05/18/2020 at 5:20 pm to provider  [facility] , who verbally acknowledged these results. IMPRESSION: CTA neck: 1. The bilateral common and internal carotid arteries are patent within the neck without hemodynamically significant stenosis. Mild atherosclerotic disease within the carotid systems as described. 2. Vertebral arteries patent within the neck. Moderate atherosclerotic narrowing at the origin of the dominant right vertebral artery. 3. Partially imaged patulous and fluid-filled esophagus. CTA head: 1. Intracranial atherosclerotic disease with multifocal stenoses, most notably as follows. 2. Severe atherosclerotic disease within the intracranial left vertebral artery with sites of occlusive or near occlusive stenosis. 3. Severe atherosclerotic disease within the intracranial right vertebral artery with sites of severe stenosis. 4. Multifocal stenoses within the bilateral posterior cerebral arteries. Most notably, there is a severe stenosis within the P3 right PCA. Moderate stenosis within the left PCA at the P1/P2 junction. Severe stenosis within the P4 left PCA. 5. Sites of severe stenosis within a proximal to mid right M2 MCA branch. 6. Sites of moderate to moderately severe stenosis within proximal to mid left M2 MCA branches. 7. Up to moderate stenosis within the cavernous/paraclinoid right ICA. Electronically Signed   By: Jackey Loge DO   On: 05/18/2020 17:20    Procedures Procedures   Medications Ordered in ED Medications  sodium chloride flush (NS) 0.9 % injection 3 mL (has no administration in time range)  Rivaroxaban (XARELTO) tablet 15 mg (has no administration in time range)  thiamine (B-1) injection 100 mg (has no administration in time range)  vitamin B-12 (CYANOCOBALAMIN) tablet 1,000 mcg (has no administration in time range)  iohexol (OMNIPAQUE) 350 MG/ML injection 75 mL (75 mLs Intravenous Contrast Given 05/18/20 1650)    ED Course  I have reviewed the triage vital signs and the nursing notes.  Pertinent  labs & imaging results that were available during  my care of the patient were reviewed by me and considered in my medical decision making (see chart for details).    MDM Rules/Calculators/A&P                          MDM  MSE complete  Brian Burns was evaluated in Emergency Department on 05/18/2020 for the symptoms described in the history of present illness. He was evaluated in the context of the global COVID-19 pandemic, which necessitated consideration that the patient might be at risk for infection with the SARS-CoV-2 virus that causes COVID-19. Institutional protocols and algorithms that pertain to the evaluation of patients at risk for COVID-19 are in a state of rapid change based on information released by regulatory bodies including the CDC and federal and state organizations. These policies and algorithms were followed during the patient's care in the ED.   Patient is presenting for evaluation of abrupt change in his neurologic exam.  Presentation is concerning for acute CVA  Patient does not meet criteria for tPA given his concurrent use of Xarelto.  Patient will require admission to medicine service.  Neuro team following as consult.   Final Clinical Impression(s) / ED Diagnoses Final diagnoses:  Altered mental status, unspecified altered mental status type    Rx / DC Orders ED Discharge Orders    None       Wynetta Fines, MD 05/18/20 2015

## 2020-05-18 NOTE — ED Notes (Signed)
Pt resting in bed @ this time w/ NAD noted. VSS Daughter @ bedside. Updating on awaiting to go to MRI. Cardiac monitoring in place w/ bp cycling and spo2 being monitored. Updated on plan of care. Deny needs or concerns @ this time. Bed low and locked.

## 2020-05-18 NOTE — ED Notes (Signed)
Patient transported to MRI 

## 2020-05-18 NOTE — H&P (Addendum)
Family Medicine Teaching [facility] Admission History and Physical Service Pager: [phone]  Patient name: Brian Burns Medical record number: [id number] Date of birth: 05/05/1945 Age: 75 y.o. Gender: male  Primary Care Provider: System, Provider Not In Consultants: neurology Code Status: DNI  Chief Complaint: right-sided weakness  Assessment and Plan: Brian Burns is a 75 y.o. male presenting with facial droop and emesis. PMH is significant for CVA (with residual dysarthria), vascular dementia, HTN, T2DM, PAF, CAD, CKD stage IIIa, PVD with claudication, GERD  Acute right-sided hemiparesis Patient with prior history of CVA with residual dysarthria as well as underlying vascular dementia presenting as a code stroke with sudden onset witnessed right-sided hemiparesis with right-sided facial droop and worsening dysarthria in the setting of vomiting and reported hypotension, now resolved and at baseline per daughter.  Clinically stable at this time with improvement in his neurologic deficits.  CT head without acute abnormalities and CTA head/neck with multiple areas of stenoses but no vessel occlusions.  tPA contraindicated given that he is on anticoagulation.  Differential includes recrudescence of prior stroke with transient hypotension, CVA/TIA (possibly left MCA).  Patient evaluated in the ED by neurology, work-up is ongoing. - admit to med-tele, Dr. Leveda Anna attending - neurology following, appreciate recommendations - MRI brain wo contrast - TTE - rEEG (given history of seizures) - AM labs: lipid panel, CBC, BMP, thiamine, vitamin B12 - continue home rivaroxaban, no antiplatelet agents at this time - empiric IV thiamine 100 mg daily and PO vitamin B12 1000 mcg daily, discontinue if normal levels - permissive HTN first 24 hours SBP < 220/110 - neuro checks q4h - monitor NIHSS per protocol - cardiac monitoring - PT/OT - SLP - vitals per unit routine  Weight loss Per chart  review, patient has lost 9 pounds within the past month and 42 pounds over the course of 9 months since last July.  Had a recent ED visit 1 month ago on 3/9, he was noted to have daily postprandial vomiting once a day, but not with every meal.  CTA neck did reveal partially imaged patulous and fluid-filled esophagus, concerning for obstructive cause for his vomiting and weight loss such as esophageal stricture or malignancy.  Patient may benefit from an EGD or MBS this admission.  Patient last had an EGD with [facility] in 2017 when he was hospitalized for a GI bleed, found to have a gastric AVM.  No concern for acute GI bleed at this time. - consider GI consult in AM - SLP  Abnormal UA UA collected with large leukocytes and positive nitrite, though urine was collected from Foley bag per ED provider.  Patient without fever, leukocytosis, or urinary complaints.  Urine specimen likely contaminated, do not suspect UTI at this time and will not treat with antibiotics.  History of seizure Has had two isolated tonic-clonic seizures, once during admission for GI bleed in 2017 and once prior to being admitted for sepsis in 2021. Not on any AEDs and family declined further neurology previously. - f/u EEG as above  BPH with urethral stricture Followed by urology and with history of dilation, urethrotomy, and urethroplasty in the past.  With chronic Foley catheter in place.  On tamsulosin 0.4 mg and finasteride 5 mg.  - continue home medications - Foley catheter in place  HTN Normotensive currently.  Home medications listed include lisinopril 10 mg daily at bedtime. - hold lisinopril for permissive HTN  T2DM Most recent A1c 5.7 in March this year. Not  on any antihyperglycemics. - monitor on BMP  PAF On anticoagulation with rivaroxaban 15 mg daily, not on any rate controlling medications.  In NSR at this time. - continue home rivaroxaban  HLD - Continue home atorvastatin 40 mg daily.  Normocytic  anemia Chronic, hemoglobin 10.9 on admission which is not significantly changed from recent levels. - monitor on CBC  Chronic pancreatitis Home medications include Creon 3 times daily with meals.  However, daughter is unsure if he had still been receiving this at his facility. - hold Creon for now, restart pending formal med rec  GERD Home medications listed famotidine 20 mg daily. - famotidine 20 mg daily prn for now  Depression - Continue home citalopram 40 mg.  Chronic low back pain Patient with history of spinal stenosis with resulting chronic muscle weakness.  Home medications include tramadol 50 mg 3 times daily as needed and duloxetine 30 mg nightly. - continue duloxetine - hold tramadol  Vitamin D deficiency - hold cholecalciferol  FEN/GI: heart-healthy diet (passed bedside swallow screen) Prophylaxis: xarelto therapeutic dosing  Disposition: med-tele  History of Present Illness:  Brian Burns is a 75 y.o. male presenting with facial droop and emesis. Patient lives at [facility] and his daughter and family came to visit him around 48. Family was in the room eating when patient suddenly vomited, lost consciousness, and BP dropped around 3:00 PM.  This was also accompanied by right-sided facial droop, worsening slurred speech from baseline, and right arm and leg weakness.  He reported heartburn and right arm numbness the night before, he received antacid and felt better. He reports that right arm numbness returned this afternoon around 3 PM when he had this episode. His daughter reports that his speech and facial droop has improved. He is moving both arms. At baseline patient has weak legs, his daughter noticed that he also had right leg weakness more than usual today, but it has also since improved.  Daughter feels that he is at his baseline now.  Daughter reports that father has had 2 episodes of seizures in the past: once when he had a GI bleed, and then last year when  he had urosepsis.  He is not on any seizure medications and family had declined further neurologic work-up previously.  Daughter is unsure if he has had any medication changes since being at his facility, but remembers that he was on the following medications prior to going to the facility: Atorvastatin, lisinopril, citalopram, duoloxetine, xarelto, finasteride, flomax  Patient reports prior CVA events in 2013 and 2015.  Daughter states that he has had some residual dysarthria but no other residual deficits.  In the chart, history of CVA noted in 2009 and 2013.  Patient was transported from [facility] via EMS, code stroke was activated by EMS.  He underwent CT/CTA of the head and neck.  CT head without any acute abnormality.  CTA head/neck with multiple areas of stenoses, but no obstruction of vessels.  Patient deemed not a candidate for tPA given that he is on anticoagulation with rivaroxaban.  Review Of Systems: Per HPI with the following additions:   Review of Systems  Constitutional: Negative for chills, fever and malaise/fatigue.  Cardiovascular: Negative for chest pain and palpitations.  Genitourinary: Negative for dysuria.  Neurological: Positive for speech change, focal weakness and weakness. Negative for dizziness and headaches.  All other systems reviewed and are negative.   Patient Active Problem List   Diagnosis Date Noted  . Abnormality of  gait 09/27/2019  . Seizure (HCC)   . Urge incontinence 12/08/2018  . Stenosis of lateral recess of lumbar spine 12/08/2018  . Dementia, vascular (HCC) 12/08/2018  . Decreased visual acuity of left eye  12/02/2018  . Gross hematuria 10/29/2018  . Other abnormalities of gait and mobility 09/02/2018  . Muscle weakness (generalized) 04/08/2018  . Lumbar spinal stenosis 04/05/2018  . Chronic pain of right knee 03/07/2017  . Chronic low back pain without sciatica 03/07/2017  . Nonischemic cardiomyopathy (HCC) 09/25/2016  .  Microalbuminuria 07/26/2015  . Paroxysmal atrial fibrillation (HCC)   . Coronary artery disease involving native coronary artery of native heart without angina pectoris   . Stricture, urethra   . CKD stage 3 due to type 2 diabetes mellitus (HCC) 03/16/2015  . Depression 03/16/2015  . Normocytic anemia   . History of Multiple lacunar infarcts (HCC) 01/31/2015  . Weight loss 03/18/2014  . GERD (gastroesophageal reflux disease) 10/21/2013  . AAA (abdominal aortic aneurysm) without rupture (HCC) 06/24/2013  . PVD (peripheral vascular disease) (HCC) 06/24/2013  . Orthostatic dizziness 03/18/2013  . Hearing loss of both ears 03/18/2013  . BPH (benign prostatic hyperplasia) 02/16/2013  . Type 2 diabetes mellitus with diabetic neuropathy, unspecified (HCC) 05/10/2012  . Type 2 diabetes mellitus with hypercholesterolemia (HCC) 02/05/2007  . Hypertension associated with diabetes (HCC) 03/27/2006    Past Medical History: Past Medical History:  Diagnosis Date  . AAA (abdominal aortic aneurysm) without rupture (HCC) 06/2014   no change in last exam from 2015  . Achalasia   . Anxiety   . Arthritis    knees,lower back  . Atrial fibrillation (HCC)   . Atrial flutter with rapid ventricular response (HCC) 01/16/2015  . Bilateral wrist pain 09/09/2014  . Bladder outlet obstruction 01/20/2015  . Bradycardia 08/27/2016  . CAD (coronary artery disease)    pt denies  . Carotid artery disease (HCC)   . Chronic back pain   . Chronic kidney disease (CKD), stage III (moderate) (HCC)   . Decreased visual acuity of left eye  12/02/2018  . Depression   . Dyspnea    reports today no pregoression  . Erectile dysfunction   . GERD (gastroesophageal reflux disease)   . Headache    hx of  . History of CVA (cerebrovascular accident)    2009  &  2013  . History of Multiple lacunar infarcts (HCC) 01/31/2015   MRI Brain 01/31/2015: Widespread old lacunar infarcts, particularly right Basal ganglia, pons, and right  cerebellum  . HLD (hyperlipidemia)   . Hypertension   . Idiopathic pancreatitis 12/2014   acute  . Knee pain, bilateral 04/09/2011  . Malnutrition of moderate degree 01/25/2015  . Memory impairment    mild dementia per PCP progress note   . Morbid obesity (HCC) 12/03/2018  . Other abnormalities of gait and mobility 12/03/2018  . PVD (peripheral vascular disease) with claudication (HCC)   . Urethral stricture 02/16/2013  . Urge incontinence 12/08/2018    Past Surgical History: Past Surgical History:  Procedure Laterality Date  . back injections     [facility]  . BALLOON DILATION N/A 12/16/2018   Procedure: URETHRAL BALLOON DILATION;  Surgeon: Crist Fat, MD;  Location: [facility];  Service: Urology;  Laterality: N/A;  . CARDIOVASCULAR STRESS TEST  08-09-2009   mild global hypokinesis,  no ischemia or scar/  ef 41%  . CATARACT EXTRACTION W/ INTRAOCULAR LENS  IMPLANT, BILATERAL  april and may 2014  . CYSTOSCOPY N/A  01/17/2017   Procedure: CYSTOSCOPY FLEXIBLE;  Surgeon: Crist Fat, MD;  Location: [facility];  Service: Urology;  Laterality: N/A;  . CYSTOSCOPY WITH RETROGRADE URETHROGRAM N/A 11/24/2014   Procedure: CYSTOSCOPY WITH RETROGRADE URETHROGRAM;  Surgeon: Crist Fat, MD;  Location[facility];  Service: Urology;  Laterality: N/A;  . CYSTOSCOPY WITH RETROGRADE URETHROGRAM N/A 12/16/2018   Procedure: CYSTOSCOPY WITH RETROGRADE URETHROGRAM;  Surgeon: Crist Fat, MD;  Location: [facility];  Service: Urology;  Laterality: N/A;  . CYSTOSCOPY WITH RETROGRADE URETHROGRAM N/A 12/16/2019   Procedure: CYSTOSCOPY WITH RETROGRADE, [employer] Internal Urethrotomy;  Surgeon: Crist Fat, MD;  Location: [facility];  Service: Urology;  Laterality: N/A;  . CYSTOSCOPY WITH URETHRAL DILATATION N/A 11/24/2014   Procedure: CYSTOSCOPY WITH URETHRAL DILATATION;  Surgeon: Crist Fat, MD;  Location: [facility];  Service:  Urology;  Laterality: N/A;  BALLOON DILATION        . ESOPHAGOGASTRODUODENOSCOPY N/A 05/15/2014   Procedure: ESOPHAGOGASTRODUODENOSCOPY (EGD);  Surgeon: Carman Ching, MD;  Location: [facility];  Service: Endoscopy;  Laterality: N/A;  . ESOPHAGOGASTRODUODENOSCOPY Left 02/02/2015   Procedure: ESOPHAGOGASTRODUODENOSCOPY (EGD);  Surgeon: Dorena Cookey, MD;  Location: [facility]COPY;  Service: Endoscopy;  Laterality: Left;  . ESOPHAGOGASTRODUODENOSCOPY Left 03/16/2015   Procedure: ESOPHAGOGASTRODUODENOSCOPY (EGD);  Surgeon: Dorena Cookey, MD;  Location: [facility];  Service: Endoscopy;  Laterality: Left;  . TRANSTHORACIC ECHOCARDIOGRAM  01-07-2012   mild to moderate dilated LV,  ef 45-50%,  mild basal hypokinesis,  grade I diastolic  dysfunction/  trivial AR/  mild MR  . URETHROPLASTY N/A 01/17/2017   Procedure: URETHEROPLASTY BUCCAL GRAFT AND BUCCAL MUCOSA GRAFT HARVEST;  Surgeon: Crist Fat, MD;  Location: [facility];  Service: Urology;  Laterality: N/A;    Social History: Social History   Tobacco Use  . Smoking status: Former Smoker    Packs/day: 0.50    Years: 40.00    Pack years: 20.00    Types: Cigarettes    Quit date: 01/29/1999    Years since quitting: 21.3  . Smokeless tobacco: Never Used  Vaping Use  . Vaping Use: Never used  Substance Use Topics  . Alcohol use: Never    Alcohol/week: 0.0 standard drinks  . Drug use: Never   Additional social history: daughter Lynden Ang is HCPOA. [facility] at [facility] Please also refer to relevant sections of EMR.  Family History: Family History  Problem Relation Age of Onset  . Lung disease Brother   . Kidney disease Brother   . Heart disease Brother        died at 37  . Heart disease Mother        died at 53  . Kidney disease Mother   . Lung disease Mother     Allergies and Medications: Allergies  Allergen Reactions  . Gabapentin Other (See Comments)    Sedation at 300mg  TID   No current facility-administered  medications on file prior to encounter.   Current Outpatient Medications on File Prior to Encounter  Medication Sig Dispense Refill  . acetaminophen (TYLENOL) 325 MG tablet Take 325 mg by mouth every 6 (six) hours as needed for moderate pain.    Marland Kitchen atorvastatin (LIPITOR) 40 MG tablet TAKE 1 TABLET(40 MG) BY MOUTH DAILY 90 tablet 2  . Cholecalciferol (VITAMIN D) 50 MCG (2000 UT) tablet Take 2,000 Units by mouth daily.    . citalopram (CELEXA) 40 MG tablet Take 40 mg by mouth daily.    Marland Kitchen  DULoxetine (CYMBALTA) 30 MG capsule Take 30 mg by mouth at bedtime.    . DULoxetine (CYMBALTA) 60 MG capsule Take 1 capsule (60 mg total) by mouth at bedtime. For back pain/nerve pain (Patient not taking: No sig reported) 30 capsule 5  . famotidine (PEPCID) 20 MG tablet Take 1 tablet (20 mg total) by mouth daily. 30 tablet 0  . ferrous sulfate 324 MG TBEC Take 1 tablet (324 mg total) by mouth daily with breakfast. (Patient not taking: No sig reported) 30 tablet 0  . finasteride (PROSCAR) 5 MG tablet Take 1 tablet (5 mg total) by mouth at bedtime. 30 tablet 0  . lipase/protease/amylase (CREON) 12000-38000 units CPEP capsule TAKE 1 CAPSULE BY MOUTH THREE TIMES DAILY WITH MEALS 270 capsule 1  . lisinopril (ZESTRIL) 10 MG tablet Take 10 mg by mouth at bedtime.    Marland Kitchen lisinopril (ZESTRIL) 5 MG tablet Take 1 tablet (5 mg total) by mouth at bedtime. (Patient not taking: No sig reported) 90 tablet 3  . nitrofurantoin, macrocrystal-monohydrate, (MACROBID) 100 MG capsule Take 100 mg by mouth 2 (two) times daily.    . potassium chloride (KLOR-CON) 10 MEQ tablet Take 1 tablet (10 mEq total) by mouth daily. 7 tablet 0  . tamsulosin (FLOMAX) 0.4 MG CAPS capsule TAKE 1 CAPSULE(0.4 MG) BY MOUTH DAILY (Patient taking differently: Take 0.4 mg by mouth daily.) 30 capsule 0  . traMADol (ULTRAM) 50 MG tablet Take 1 tablet (50 mg total) by mouth 3 (three) times daily as needed for moderate pain. 90 tablet 5  . XARELTO 15 MG TABS tablet  TAKE 1 TABLET(15 MG) BY MOUTH TWICE DAILY WITH A MEAL (Patient taking differently: Take 15 mg by mouth at bedtime.) 90 tablet 2    Objective: BP 139/84   Pulse (!) 56   Temp 97.9 F (36.6 C) (Oral)   Resp 13   Wt 77.7 kg   SpO2 99%   BMI 20.85 kg/m  Exam: General: Elderly male resting in bed comfortably, NAD Eyes: PERRL, EOMI, bilateral arcus senilis ENTM: [place]/AT, EEG leads in place, MMM Neck: Supple Cardiovascular: RRR, no murmurs Respiratory: Clear to auscultation bilaterally, breathing comfortably on room air Gastrointestinal: Soft, nontender, positive bowel sounds MSK: No deformity Derm: Extremities somewhat cool, skin is dry Neuro: A&Ox4, minimal right facial droop, dysarthric (at baseline per daughter), otherwise CN II through XII intact, sensation to light touch symmetric throughout, grip strength 4+/5 on the right, otherwise 5/5 strength in all extremities, 2+ bicep reflexes bilaterally, unable to elicit patellar reflexes bilaterally Psych: Affect appropriate  Labs and Imaging: CBC BMET  Recent Labs  Lab 05/18/20 1630 05/18/20 1640  WBC 7.6  --   HGB 10.6* 10.9*  HCT 34.3* 32.0*  PLT 162  --    Recent Labs  Lab 05/18/20 1630 05/18/20 1640  NA 135 134*  K 4.1 4.1  CL 106 105  CO2 20*  --   BUN 17 20  CREATININE 1.45* 1.40*  GLUCOSE 129* 126*  CALCIUM 8.7*  --      CT HEAD CODE STROKE WO CONTRAST  Result Date: 05/18/2020 CLINICAL DATA:  Code stroke.  Right-sided weakness. EXAM: CT HEAD WITHOUT CONTRAST TECHNIQUE: Contiguous axial images were obtained from the base of the skull through the vertex without intravenous contrast. COMPARISON:  CT head 04/05/2020 FINDINGS: Brain: Moderate atrophy. Moderate patchy white matter hypodensity bilaterally unchanged from the prior study. Negative for acute infarct, hemorrhage, mass Vascular: Negative for hyperdense vessel. Extensive atherosclerotic calcification in the  carotid and vertebral arteries bilaterally. Skull:  Negative Sinuses/Orbits: Paranasal sinuses clear. Bilateral cataract extraction Other: None ASPECTS (Alberta Stroke Program Early CT Score) - Ganglionic level infarction (caudate, lentiform nuclei, internal capsule, insula, M1-M3 cortex): 7 - Supraganglionic infarction (M4-M6 cortex): 3 Total score (0-10 with 10 being normal): 10 IMPRESSION: 1. No acute abnormality 2. ASPECTS is 10 3. Moderate atrophy and chronic microvascular ischemic change. No change from the prior study. 4. Extensive intracranial atherosclerotic calcification. 5. Code stroke imaging results were communicated on 05/18/2020 at 4:45 pm to provider Bhagat via text message Electronically Signed   By: Marlan Palau M.D.   On: 05/18/2020 16:46   CT ANGIO HEAD CODE STROKE  Result Date: 05/18/2020 CLINICAL DATA:  Neuro deficit, acute, stroke suspected. Additional history provided: Right-sided weakness. EXAM: CT ANGIOGRAPHY HEAD AND NECK TECHNIQUE: Multidetector CT imaging of the head and neck was performed using the standard protocol during bolus administration of intravenous contrast. Multiplanar CT image reconstructions and MIPs were obtained to evaluate the vascular anatomy. Carotid stenosis measurements (when applicable) are obtained utilizing NASCET criteria, using the distal internal carotid diameter as the denominator. CONTRAST:  75 mL Omnipaque 350 intravenous contrast. COMPARISON:  Noncontrast head CT performed earlier today 05/18/2020. FINDINGS: CTA NECK FINDINGS Aortic arch: Standard aortic branching. Atherosclerotic plaque within the visualized aortic arch and proximal major branch vessels of the neck. No hemodynamically significant innominate or proximal subclavian artery stenosis. Right carotid system: CCA and ICA patent within the neck without stenosis. Minimal calcified plaque within the carotid bifurcation. Left carotid system: CCA and ICA patent within the neck. Mild atherosclerotic plaque within the carotid bifurcation and proximal  ICA with less than 50% stenosis of the proximal ICA. Tortuosity of the cervical ICA. Vertebral arteries: Vertebral arteries patent within the neck. Calcified plaque results in moderate stenosis at the origin of the dominant right vertebral artery. Nonstenotic calcified plaque at the origin of the left vertebral artery. Skeleton: Cervical spondylosis. No acute bony abnormality or aggressive osseous lesion. Other neck: No neck mass or cervical lymphadenopathy. Thyroid unremarkable. Upper chest: No consolidation within the imaged lung apices. Partially imaged patulous and fluid-filled esophagus. Review of the MIP images confirms the above findings CTA HEAD FINDINGS Anterior circulation: The intracranial internal carotid arteries are patent. Calcified plaque within both vessels. Up to moderate stenosis within the cavernous/paraclinoid right ICA. Mild stenosis of the cavernous/paraclinoid left ICA. The M1 middle cerebral arteries are patent. No M2 proximal branch occlusion is identified. There is significant atherosclerotic irregularity of the M2 and more distal middle cerebral arteries bilaterally. This includes multiple sites of high-grade stenosis within a proximal to mid right M2 MCA branch (series 10, image 14). Sites of moderate to moderately severe stenosis are also present within proximal to mid left M2 MCA branches (for instance as seen on series 11, image 58) (series 10, image 29). The anterior cerebral arteries are patent. Dominant left anterior cerebral artery. No intracranial aneurysm is identified. Posterior circulation: The right vertebral artery is patent. Prominent soft and calcified plaque within the right vertebral artery with sites of up to severe stenosis. Severe atherosclerotic disease within the intracranial left vertebral artery with sites of occlusive or near occlusive stenosis. The basilar artery is patent. The posterior cerebral arteries are patent. Atherosclerotic irregularity of the bilateral  posterior cerebral arteries with multifocal stenoses. Most notably, there is a severe stenosis within the right posterior cerebral artery at the P3 segment (series 10, image 19). Moderate stenosis within the left PCA at the  P2/P3 junction. A severe stenosis is also present within the P4 left posterior cerebral artery (series 10, image 25). Posterior communicating arteries are hypoplastic or absent bilaterally. Venous sinuses: Poorly assessed due to contrast bolus timing Anatomic variants: As described Review of the MIP images confirms the above findings These results were called by telephone at the time of interpretation on 05/18/2020 at 5:20 pm to provider [facility] , who verbally acknowledged these results. IMPRESSION: CTA neck: 1. The bilateral common and internal carotid arteries are patent within the neck without hemodynamically significant stenosis. Mild atherosclerotic disease within the carotid systems as described. 2. Vertebral arteries patent within the neck. Moderate atherosclerotic narrowing at the origin of the dominant right vertebral artery. 3. Partially imaged patulous and fluid-filled esophagus. CTA head: 1. Intracranial atherosclerotic disease with multifocal stenoses, most notably as follows. 2. Severe atherosclerotic disease within the intracranial left vertebral artery with sites of occlusive or near occlusive stenosis. 3. Severe atherosclerotic disease within the intracranial right vertebral artery with sites of severe stenosis. 4. Multifocal stenoses within the bilateral posterior cerebral arteries. Most notably, there is a severe stenosis within the P3 right PCA. Moderate stenosis within the left PCA at the P1/P2 junction. Severe stenosis within the P4 left PCA. 5. Sites of severe stenosis within a proximal to mid right M2 MCA branch. 6. Sites of moderate to moderately severe stenosis within proximal to mid left M2 MCA branches. 7. Up to moderate stenosis within the cavernous/paraclinoid  right ICA. Electronically Signed   By: Jackey Loge DO   On: 05/18/2020 17:20   CT ANGIO NECK CODE STROKE  Result Date: 05/18/2020 CLINICAL DATA:  Neuro deficit, acute, stroke suspected. Additional history provided: Right-sided weakness. EXAM: CT ANGIOGRAPHY HEAD AND NECK TECHNIQUE: Multidetector CT imaging of the head and neck was performed using the standard protocol during bolus administration of intravenous contrast. Multiplanar CT image reconstructions and MIPs were obtained to evaluate the vascular anatomy. Carotid stenosis measurements (when applicable) are obtained utilizing NASCET criteria, using the distal internal carotid diameter as the denominator. CONTRAST:  75 mL Omnipaque 350 intravenous contrast. COMPARISON:  Noncontrast head CT performed earlier today 05/18/2020. FINDINGS: CTA NECK FINDINGS Aortic arch: Standard aortic branching. Atherosclerotic plaque within the visualized aortic arch and proximal major branch vessels of the neck. No hemodynamically significant innominate or proximal subclavian artery stenosis. Right carotid system: CCA and ICA patent within the neck without stenosis. Minimal calcified plaque within the carotid bifurcation. Left carotid system: CCA and ICA patent within the neck. Mild atherosclerotic plaque within the carotid bifurcation and proximal ICA with less than 50% stenosis of the proximal ICA. Tortuosity of the cervical ICA. Vertebral arteries: Vertebral arteries patent within the neck. Calcified plaque results in moderate stenosis at the origin of the dominant right vertebral artery. Nonstenotic calcified plaque at the origin of the left vertebral artery. Skeleton: Cervical spondylosis. No acute bony abnormality or aggressive osseous lesion. Other neck: No neck mass or cervical lymphadenopathy. Thyroid unremarkable. Upper chest: No consolidation within the imaged lung apices. Partially imaged patulous and fluid-filled esophagus. Review of the MIP images confirms the  above findings CTA HEAD FINDINGS Anterior circulation: The intracranial internal carotid arteries are patent. Calcified plaque within both vessels. Up to moderate stenosis within the cavernous/paraclinoid right ICA. Mild stenosis of the cavernous/paraclinoid left ICA. The M1 middle cerebral arteries are patent. No M2 proximal branch occlusion is identified. There is significant atherosclerotic irregularity of the M2 and more distal middle cerebral arteries bilaterally. This includes multiple  sites of high-grade stenosis within a proximal to mid right M2 MCA branch (series 10, image 14). Sites of moderate to moderately severe stenosis are also present within proximal to mid left M2 MCA branches (for instance as seen on series 11, image 58) (series 10, image 29). The anterior cerebral arteries are patent. Dominant left anterior cerebral artery. No intracranial aneurysm is identified. Posterior circulation: The right vertebral artery is patent. Prominent soft and calcified plaque within the right vertebral artery with sites of up to severe stenosis. Severe atherosclerotic disease within the intracranial left vertebral artery with sites of occlusive or near occlusive stenosis. The basilar artery is patent. The posterior cerebral arteries are patent. Atherosclerotic irregularity of the bilateral posterior cerebral arteries with multifocal stenoses. Most notably, there is a severe stenosis within the right posterior cerebral artery at the P3 segment (series 10, image 19). Moderate stenosis within the left PCA at the P2/P3 junction. A severe stenosis is also present within the P4 left posterior cerebral artery (series 10, image 25). Posterior communicating arteries are hypoplastic or absent bilaterally. Venous sinuses: Poorly assessed due to contrast bolus timing Anatomic variants: As described Review of the MIP images confirms the above findings These results were called by telephone at the time of interpretation on  05/18/2020 at 5:20 pm to provider [facility] , who verbally acknowledged these results. IMPRESSION: CTA neck: 1. The bilateral common and internal carotid arteries are patent within the neck without hemodynamically significant stenosis. Mild atherosclerotic disease within the carotid systems as described. 2. Vertebral arteries patent within the neck. Moderate atherosclerotic narrowing at the origin of the dominant right vertebral artery. 3. Partially imaged patulous and fluid-filled esophagus. CTA head: 1. Intracranial atherosclerotic disease with multifocal stenoses, most notably as follows. 2. Severe atherosclerotic disease within the intracranial left vertebral artery with sites of occlusive or near occlusive stenosis. 3. Severe atherosclerotic disease within the intracranial right vertebral artery with sites of severe stenosis. 4. Multifocal stenoses within the bilateral posterior cerebral arteries. Most notably, there is a severe stenosis within the P3 right PCA. Moderate stenosis within the left PCA at the P1/P2 junction. Severe stenosis within the P4 left PCA. 5. Sites of severe stenosis within a proximal to mid right M2 MCA branch. 6. Sites of moderate to moderately severe stenosis within proximal to mid left M2 MCA branches. 7. Up to moderate stenosis within the cavernous/paraclinoid right ICA. Electronically Signed   By: Jackey LogeKyle  Golden DO   On: 05/18/2020 17:20     Littie DeedsSun, Richard, MD 05/18/2020, 8:10 PM PGY-1, [facility] FPTS Intern pager: [phone], text pages welcome   FPTS Upper-Level Resident Addendum   I have independently interviewed and examined the patient. I have discussed the above with the original author and agree with their documentation. My edits for correction/addition/clarification are in pink. Please see also any attending notes.   Shirlean Mylaraitlin Deloros Beretta, M.D. PGY-2, [facility] 05/19/2020 1:15 AM  FPTS Service pager: [phone] (text pages welcome  through [facility])

## 2020-05-18 NOTE — ED Notes (Signed)
MRI made aware pt has finished his EEG stated coming to get pt.

## 2020-05-18 NOTE — ED Notes (Signed)
EDP aware unable to obtain blood culture.

## 2020-05-19 ENCOUNTER — Observation Stay (HOSPITAL_COMMUNITY): Payer: Medicare Other

## 2020-05-19 DIAGNOSIS — R1319 Other dysphagia: Secondary | ICD-10-CM

## 2020-05-19 DIAGNOSIS — I6389 Other cerebral infarction: Secondary | ICD-10-CM | POA: Diagnosis not present

## 2020-05-19 DIAGNOSIS — R29810 Facial weakness: Secondary | ICD-10-CM | POA: Diagnosis not present

## 2020-05-19 DIAGNOSIS — R1310 Dysphagia, unspecified: Secondary | ICD-10-CM | POA: Diagnosis not present

## 2020-05-19 DIAGNOSIS — G459 Transient cerebral ischemic attack, unspecified: Secondary | ICD-10-CM | POA: Diagnosis not present

## 2020-05-19 DIAGNOSIS — R4182 Altered mental status, unspecified: Secondary | ICD-10-CM | POA: Insufficient documentation

## 2020-05-19 DIAGNOSIS — R059 Cough, unspecified: Secondary | ICD-10-CM | POA: Diagnosis not present

## 2020-05-19 DIAGNOSIS — R933 Abnormal findings on diagnostic imaging of other parts of digestive tract: Secondary | ICD-10-CM | POA: Diagnosis not present

## 2020-05-19 DIAGNOSIS — R1312 Dysphagia, oropharyngeal phase: Secondary | ICD-10-CM

## 2020-05-19 DIAGNOSIS — R634 Abnormal weight loss: Secondary | ICD-10-CM

## 2020-05-19 DIAGNOSIS — G8191 Hemiplegia, unspecified affecting right dominant side: Secondary | ICD-10-CM | POA: Diagnosis not present

## 2020-05-19 DIAGNOSIS — E440 Moderate protein-calorie malnutrition: Secondary | ICD-10-CM

## 2020-05-19 DIAGNOSIS — R4781 Slurred speech: Secondary | ICD-10-CM | POA: Diagnosis not present

## 2020-05-19 DIAGNOSIS — G319 Degenerative disease of nervous system, unspecified: Secondary | ICD-10-CM | POA: Diagnosis not present

## 2020-05-19 DIAGNOSIS — R935 Abnormal findings on diagnostic imaging of other abdominal regions, including retroperitoneum: Secondary | ICD-10-CM | POA: Diagnosis not present

## 2020-05-19 LAB — ECHOCARDIOGRAM COMPLETE
AR max vel: 2.44 cm2
AV Area VTI: 2.65 cm2
AV Area mean vel: 2.07 cm2
AV Mean grad: 3 mmHg
AV Peak grad: 6.6 mmHg
Ao pk vel: 1.28 m/s
Area-P 1/2: 2.54 cm2
Calc EF: 53.6 %
P 1/2 time: 805 msec
S' Lateral: 3.7 cm
Single Plane A2C EF: 53.2 %
Single Plane A4C EF: 53.4 %
Weight: 2740.76 oz

## 2020-05-19 LAB — BASIC METABOLIC PANEL
Anion gap: 7 (ref 5–15)
BUN: 17 mg/dL (ref 8–23)
CO2: 25 mmol/L (ref 22–32)
Calcium: 8.7 mg/dL — ABNORMAL LOW (ref 8.9–10.3)
Chloride: 102 mmol/L (ref 98–111)
Creatinine, Ser: 1.36 mg/dL — ABNORMAL HIGH (ref 0.61–1.24)
GFR, Estimated: 55 mL/min — ABNORMAL LOW (ref >60)
Glucose, Bld: 94 mg/dL (ref 70–99)
Potassium: 4.1 mmol/L (ref 3.5–5.1)
Sodium: 134 mmol/L — ABNORMAL LOW (ref 135–145)

## 2020-05-19 LAB — LIPID PANEL
Cholesterol: 155 mg/dL (ref 0–200)
HDL: 38 mg/dL — ABNORMAL LOW (ref >40)
LDL Cholesterol: 102 mg/dL — ABNORMAL HIGH (ref 0–99)
Total CHOL/HDL Ratio: 4.1 RATIO
Triglycerides: 75 mg/dL (ref <150)
VLDL: 15 mg/dL (ref 0–40)

## 2020-05-19 LAB — CBC
HCT: 31.9 % — ABNORMAL LOW (ref 39.0–52.0)
Hemoglobin: 10.2 g/dL — ABNORMAL LOW (ref 13.0–17.0)
MCH: 29.1 pg (ref 26.0–34.0)
MCHC: 32 g/dL (ref 30.0–36.0)
MCV: 91.1 fL (ref 80.0–100.0)
Platelets: 164 10*3/uL (ref 150–400)
RBC: 3.5 MIL/uL — ABNORMAL LOW (ref 4.22–5.81)
RDW: 16.3 % — ABNORMAL HIGH (ref 11.5–15.5)
WBC: 5.6 10*3/uL (ref 4.0–10.5)
nRBC: 0 % (ref 0.0–0.2)

## 2020-05-19 LAB — VITAMIN B12: Vitamin B-12: 253 pg/mL (ref 180–914)

## 2020-05-19 MED ORDER — APIXABAN 5 MG PO TABS
5.0000 mg | ORAL_TABLET | Freq: Two times a day (BID) | ORAL | Status: DC
  Administered 2020-05-19: 5 mg via ORAL
  Filled 2020-05-19 (×2): qty 1

## 2020-05-19 MED ORDER — POLYETHYLENE GLYCOL 3350 17 G PO PACK
17.0000 g | PACK | Freq: Every day | ORAL | Status: DC
  Administered 2020-05-19 – 2020-05-20 (×2): 17 g via ORAL
  Filled 2020-05-19 (×3): qty 1

## 2020-05-19 MED ORDER — SODIUM CHLORIDE 0.9 % IV SOLN: INTRAVENOUS | Status: DC

## 2020-05-19 MED ORDER — CHLORHEXIDINE GLUCONATE CLOTH 2 % EX PADS
6.0000 | MEDICATED_PAD | Freq: Every day | CUTANEOUS | Status: DC
  Administered 2020-05-19 – 2020-05-23 (×5): 6 via TOPICAL

## 2020-05-19 MED ORDER — ATORVASTATIN CALCIUM 80 MG PO TABS
80.0000 mg | ORAL_TABLET | Freq: Every day | ORAL | Status: DC
  Administered 2020-05-20 – 2020-05-23 (×4): 80 mg via ORAL
  Filled 2020-05-19 (×4): qty 1

## 2020-05-19 MED ORDER — PANCRELIPASE (LIP-PROT-AMYL) 12000-38000 UNITS PO CPEP
12000.0000 [IU] | ORAL_CAPSULE | Freq: Three times a day (TID) | ORAL | Status: DC
  Administered 2020-05-20 – 2020-05-23 (×10): 12000 [IU] via ORAL
  Filled 2020-05-19 (×14): qty 1

## 2020-05-19 NOTE — Progress Notes (Signed)
Modified Barium Swallow Progress Note  Patient Details  Name: Brian Burns MRN: [id number] Date of Birth: 30-Jan-1945  Today's Date: 05/19/2020  Modified Barium Swallow completed.  Full report located under Chart Review in the Imaging Section.  Brief recommendations include the following:  Clinical Impression  Pt presents with a mild oral dysphagia characterized primarily by oral holding, which has been noted in previous swallowing evaluations at least as far back as 2017. His pharyngeal phase is [facility]. Near the end of the study, the pt began to have retrograde flow of thin liquids back into the pharynx, which he would spontaneously re-swallow. No aspiration was observed. Esophagram was also completed after this study - pelase see radiologist's note for details. Based on results of this study and that report, pt appears to have a primary esophageal dysphagia. Oropharyngeal swallowing appears to be at his baseline, so SLP will s/o and recommend additional f/u per GI.   Swallow Evaluation Recommendations   Recommended Consults: Consider GI evaluation   SLP Diet Recommendations: Regular solids;Thin liquid   Liquid Administration via: Cup;Straw   Medication Administration: Whole meds with liquid   Supervision: Patient able to self feed;Intermittent supervision to cue for compensatory strategies   Compensations: Slow rate;Small sips/bites;Follow solids with liquid   Postural Changes: Remain semi-upright after after feeds/meals (Comment);Seated upright at 90 degrees   Oral Care Recommendations: Oral care BID        Mahala Menghini., M.A. CCC-SLP Acute Rehabilitation Services Pager [phone] Office [phone]  05/19/2020,4:16 PM

## 2020-05-19 NOTE — Progress Notes (Addendum)
STROKE TEAM PROGRESS NOTE   SUBJECTIVE (INTERVAL HISTORY) Brian Burns is at the bedside.  Overall Brian condition is completely resolved.  Per Burns, Wednesday night patient had complained right arm numbness and heartburn.  Thursday, i.e. yesterday, patient still has right arm numbness and vomited 4 times.  Before vomiting, patient had brief episode of eyes rolling back and body tremors lasting seconds.  After the vomiting episode, patient was found to be lethargic right facial droop slurred speech.  However, overnight, Burns stated that patient symptoms all resolved.  MRI negative for stroke.  EEG no seizure.   OBJECTIVE Temp:  [97.7 F (36.5 C)-98.2 F (36.8 C)] 97.8 F (36.6 C) (04/22 0702) Pulse Rate:  [56-89] 82 (04/22 1530) Resp:  [12-22] 14 (04/22 1530) BP: (94-177)/(71-90) 108/73 (04/22 1530) SpO2:  [94 %-100 %] 100 % (04/22 1530) Weight:  [77.7 kg] 77.7 kg (04/21 1700)  Recent Labs  Lab 05/18/20 1629  GLUCAP 115*   Recent Labs  Lab 05/18/20 1630 05/18/20 1640 05/19/20 0439  NA 135 134* 134*  K 4.1 4.1 4.1  CL 106 105 102  CO2 20*  --  25  GLUCOSE 129* 126* 94  BUN 17 20 17   CREATININE 1.45* 1.40* 1.36*  CALCIUM 8.7*  --  8.7*   Recent Labs  Lab 05/18/20 1630  AST 18  ALT 17  ALKPHOS 81  BILITOT 0.8  PROT 6.1*  ALBUMIN 3.0*   Recent Labs  Lab 05/18/20 1630 05/18/20 1640 05/19/20 0439  WBC 7.6  --  5.6  NEUTROABS 4.5  --   --   HGB 10.6* 10.9* 10.2*  HCT 34.3* 32.0* 31.9*  MCV 93.5  --  91.1  PLT 162  --  164   No results for input(s): CKTOTAL, CKMB, CKMBINDEX, TROPONINI in the last 168 hours. Recent Labs    05/18/20 1630  LABPROT 19.2*  INR 1.6*   Recent Labs    05/18/20 1702  COLORURINE AMBER*  LABSPEC 1.027  PHURINE 5.0  GLUCOSEU NEGATIVE  HGBUR MODERATE*  BILIRUBINUR NEGATIVE  KETONESUR NEGATIVE  PROTEINUR 30*  NITRITE POSITIVE*  LEUKOCYTESUR LARGE*       Component Value Date/Time   CHOL 155 05/19/2020 0439   TRIG 75  05/19/2020 0439   HDL 38 (L) 05/19/2020 0439   CHOLHDL 4.1 05/19/2020 0439   VLDL 15 05/19/2020 0439   LDLCALC 102 (H) 05/19/2020 0439   Lab Results  Component Value Date   HGBA1C 5.7 (A) 03/29/2020      Component Value Date/Time   LABOPIA NONE DETECTED 05/25/2019 0413   COCAINSCRNUR NONE DETECTED 05/25/2019 0413   LABBENZ NONE DETECTED 05/25/2019 0413   AMPHETMU NONE DETECTED 05/25/2019 0413   THCU NONE DETECTED 05/25/2019 0413   LABBARB NONE DETECTED 05/25/2019 0413    No results for input(s): ETH in the last 168 hours.  I have personally reviewed the radiological images below and agree with the radiology interpretations.  MR BRAIN WO CONTRAST  Result Date: 05/19/2020 CLINICAL DATA:  Slurred speech and right facial droop EXAM: MRI HEAD WITHOUT CONTRAST TECHNIQUE: Multiplanar, multiecho pulse sequences of the brain and surrounding structures were obtained without intravenous contrast. COMPARISON:  Three hundred seventeen FINDINGS: Brain: No acute infarct, mass effect or extra-axial collection. No acute or chronic hemorrhage. Hyperintense T2-weighted signal is widespread throughout the white matter. Diffuse, severe atrophy. Multiple old small vessel infarcts, including the right basal ganglia and right cerebellum. The midline structures are normal. Vascular: Major flow voids are preserved. Skull  and upper cervical spine: Normal calvarium and skull base. Visualized upper cervical spine and soft tissues are normal. Sinuses/Orbits:No paranasal sinus fluid levels or advanced mucosal thickening. No mastoid or middle ear effusion. Normal orbits. IMPRESSION: 1. No acute intracranial abnormality. 2. Severe atrophy and chronic ischemic microangiopathy. Electronically Signed   By: Deatra Robinson M.D.   On: 05/19/2020 00:30   EEG adult  Result Date: 05/19/2020 Charlsie Quest, MD     05/19/2020  8:58 AM Patient Name: Brian Burns MRN: [id number] Epilepsy Attending: Charlsie Quest Referring  Physician/Provider: Dr. Jimmye Norman, NP Date: 05/18/2020 Duration: 37.33 mins Patient history: 75 year old male with history of seizures who presented with acute onset of right facial droop and weakness.  EEG to evaluate for seizures. Level of alertness: Awake AEDs during EEG study: None Technical aspects: This EEG study was done with scalp electrodes positioned according to the 10-20 International system of electrode placement. Electrical activity was acquired at a sampling rate of 500Hz  and reviewed with a high frequency filter of 70Hz  and a low frequency filter of 1Hz . EEG data were recorded continuously and digitally stored. Description: No clear posterior dominant rhythm was seen. EEG showed continuous generalized 3 to 6 Hz theta-delta slowing. Hyperventilation and photic stimulation were not performed.   ABNORMALITY - Continuous slow, generalized IMPRESSION: This study is suggestive of moderate diffuse encephalopathy, nonspecific etiology. No seizures or epileptiform discharges were seen throughout the recording.   CT HEAD CODE STROKE WO CONTRAST  Result Date: 05/18/2020 CLINICAL DATA:  Code stroke.  Right-sided weakness. EXAM: CT HEAD WITHOUT CONTRAST TECHNIQUE: Contiguous axial images were obtained from the base of the skull through the vertex without intravenous contrast. COMPARISON:  CT head 04/05/2020 FINDINGS: Brain: Moderate atrophy. Moderate patchy white matter hypodensity bilaterally unchanged from the prior study. Negative for acute infarct, hemorrhage, mass Vascular: Negative for hyperdense vessel. Extensive atherosclerotic calcification in the carotid and vertebral arteries bilaterally. Skull: Negative Sinuses/Orbits: Paranasal sinuses clear. Bilateral cataract extraction Other: None ASPECTS (Alberta Stroke Program Early CT Score) - Ganglionic level infarction (caudate, lentiform nuclei, internal capsule, insula, M1-M3 cortex): 7 - Supraganglionic infarction (M4-M6  cortex): 3 Total score (0-10 with 10 being normal): 10 IMPRESSION: 1. No acute abnormality 2. ASPECTS is 10 3. Moderate atrophy and chronic microvascular ischemic change. No change from the prior study. 4. Extensive intracranial atherosclerotic calcification. 5. Code stroke imaging results were communicated on 05/18/2020 at 4:45 pm to provider Bhagat via text message Electronically Signed   By: 05/20/2020 M.D.   On: 05/18/2020 16:46   CT ANGIO HEAD CODE STROKE  Result Date: 05/18/2020 CLINICAL DATA:  Neuro deficit, acute, stroke suspected. Additional history provided: Right-sided weakness. EXAM: CT ANGIOGRAPHY HEAD AND NECK TECHNIQUE: Multidetector CT imaging of the head and neck was performed using the standard protocol during bolus administration of intravenous contrast. Multiplanar CT image reconstructions and MIPs were obtained to evaluate the vascular anatomy. Carotid stenosis measurements (when applicable) are obtained utilizing NASCET criteria, using the distal internal carotid diameter as the denominator. CONTRAST:  75 mL Omnipaque 350 intravenous contrast. COMPARISON:  Noncontrast head CT performed earlier today 05/18/2020. FINDINGS: CTA NECK FINDINGS Aortic arch: Standard aortic branching. Atherosclerotic plaque within the visualized aortic arch and proximal major branch vessels of the neck. No hemodynamically significant innominate or proximal subclavian artery stenosis. Right carotid system: CCA and ICA patent within the neck without stenosis. Minimal calcified plaque within the carotid bifurcation. Left carotid system: CCA and ICA patent within  the neck. Mild atherosclerotic plaque within the carotid bifurcation and proximal ICA with less than 50% stenosis of the proximal ICA. Tortuosity of the cervical ICA. Vertebral arteries: Vertebral arteries patent within the neck. Calcified plaque results in moderate stenosis at the origin of the dominant right vertebral artery. Nonstenotic calcified plaque  at the origin of the left vertebral artery. Skeleton: Cervical spondylosis. No acute bony abnormality or aggressive osseous lesion. Other neck: No neck mass or cervical lymphadenopathy. Thyroid unremarkable. Upper chest: No consolidation within the imaged lung apices. Partially imaged patulous and fluid-filled esophagus. Review of the MIP images confirms the above findings CTA HEAD FINDINGS Anterior circulation: The intracranial internal carotid arteries are patent. Calcified plaque within both vessels. Up to moderate stenosis within the cavernous/paraclinoid right ICA. Mild stenosis of the cavernous/paraclinoid left ICA. The M1 middle cerebral arteries are patent. No M2 proximal branch occlusion is identified. There is significant atherosclerotic irregularity of the M2 and more distal middle cerebral arteries bilaterally. This includes multiple sites of high-grade stenosis within a proximal to mid right M2 MCA branch (series 10, image 14). Sites of moderate to moderately severe stenosis are also present within proximal to mid left M2 MCA branches (for instance as seen on series 11, image 58) (series 10, image 29). The anterior cerebral arteries are patent. Dominant left anterior cerebral artery. No intracranial aneurysm is identified. Posterior circulation: The right vertebral artery is patent. Prominent soft and calcified plaque within the right vertebral artery with sites of up to severe stenosis. Severe atherosclerotic disease within the intracranial left vertebral artery with sites of occlusive or near occlusive stenosis. The basilar artery is patent. The posterior cerebral arteries are patent. Atherosclerotic irregularity of the bilateral posterior cerebral arteries with multifocal stenoses. Most notably, there is a severe stenosis within the right posterior cerebral artery at the P3 segment (series 10, image 19). Moderate stenosis within the left PCA at the P2/P3 junction. A severe stenosis is also present  within the P4 left posterior cerebral artery (series 10, image 25). Posterior communicating arteries are hypoplastic or absent bilaterally. Venous sinuses: Poorly assessed due to contrast bolus timing Anatomic variants: As described Review of the MIP images confirms the above findings These results were called by telephone at the time of interpretation on 05/18/2020 at 5:20 pm to provider [facility] , who verbally acknowledged these results. IMPRESSION: CTA neck: 1. The bilateral common and internal carotid arteries are patent within the neck without hemodynamically significant stenosis. Mild atherosclerotic disease within the carotid systems as described. 2. Vertebral arteries patent within the neck. Moderate atherosclerotic narrowing at the origin of the dominant right vertebral artery. 3. Partially imaged patulous and fluid-filled esophagus. CTA head: 1. Intracranial atherosclerotic disease with multifocal stenoses, most notably as follows. 2. Severe atherosclerotic disease within the intracranial left vertebral artery with sites of occlusive or near occlusive stenosis. 3. Severe atherosclerotic disease within the intracranial right vertebral artery with sites of severe stenosis. 4. Multifocal stenoses within the bilateral posterior cerebral arteries. Most notably, there is a severe stenosis within the P3 right PCA. Moderate stenosis within the left PCA at the P1/P2 junction. Severe stenosis within the P4 left PCA. 5. Sites of severe stenosis within a proximal to mid right M2 MCA branch. 6. Sites of moderate to moderately severe stenosis within proximal to mid left M2 MCA branches. 7. Up to moderate stenosis within the cavernous/paraclinoid right ICA. Electronically Signed   By: Jackey Loge DO   On: 05/18/2020 17:20   CT  ANGIO NECK CODE STROKE  Result Date: 05/18/2020 CLINICAL DATA:  Neuro deficit, acute, stroke suspected. Additional history provided: Right-sided weakness. EXAM: CT ANGIOGRAPHY HEAD AND  NECK TECHNIQUE: Multidetector CT imaging of the head and neck was performed using the standard protocol during bolus administration of intravenous contrast. Multiplanar CT image reconstructions and MIPs were obtained to evaluate the vascular anatomy. Carotid stenosis measurements (when applicable) are obtained utilizing NASCET criteria, using the distal internal carotid diameter as the denominator. CONTRAST:  75 mL Omnipaque 350 intravenous contrast. COMPARISON:  Noncontrast head CT performed earlier today 05/18/2020. FINDINGS: CTA NECK FINDINGS Aortic arch: Standard aortic branching. Atherosclerotic plaque within the visualized aortic arch and proximal major branch vessels of the neck. No hemodynamically significant innominate or proximal subclavian artery stenosis. Right carotid system: CCA and ICA patent within the neck without stenosis. Minimal calcified plaque within the carotid bifurcation. Left carotid system: CCA and ICA patent within the neck. Mild atherosclerotic plaque within the carotid bifurcation and proximal ICA with less than 50% stenosis of the proximal ICA. Tortuosity of the cervical ICA. Vertebral arteries: Vertebral arteries patent within the neck. Calcified plaque results in moderate stenosis at the origin of the dominant right vertebral artery. Nonstenotic calcified plaque at the origin of the left vertebral artery. Skeleton: Cervical spondylosis. No acute bony abnormality or aggressive osseous lesion. Other neck: No neck mass or cervical lymphadenopathy. Thyroid unremarkable. Upper chest: No consolidation within the imaged lung apices. Partially imaged patulous and fluid-filled esophagus. Review of the MIP images confirms the above findings CTA HEAD FINDINGS Anterior circulation: The intracranial internal carotid arteries are patent. Calcified plaque within both vessels. Up to moderate stenosis within the cavernous/paraclinoid right ICA. Mild stenosis of the cavernous/paraclinoid left ICA. The  M1 middle cerebral arteries are patent. No M2 proximal branch occlusion is identified. There is significant atherosclerotic irregularity of the M2 and more distal middle cerebral arteries bilaterally. This includes multiple sites of high-grade stenosis within a proximal to mid right M2 MCA branch (series 10, image 14). Sites of moderate to moderately severe stenosis are also present within proximal to mid left M2 MCA branches (for instance as seen on series 11, image 58) (series 10, image 29). The anterior cerebral arteries are patent. Dominant left anterior cerebral artery. No intracranial aneurysm is identified. Posterior circulation: The right vertebral artery is patent. Prominent soft and calcified plaque within the right vertebral artery with sites of up to severe stenosis. Severe atherosclerotic disease within the intracranial left vertebral artery with sites of occlusive or near occlusive stenosis. The basilar artery is patent. The posterior cerebral arteries are patent. Atherosclerotic irregularity of the bilateral posterior cerebral arteries with multifocal stenoses. Most notably, there is a severe stenosis within the right posterior cerebral artery at the P3 segment (series 10, image 19). Moderate stenosis within the left PCA at the P2/P3 junction. A severe stenosis is also present within the P4 left posterior cerebral artery (series 10, image 25). Posterior communicating arteries are hypoplastic or absent bilaterally. Venous sinuses: Poorly assessed due to contrast bolus timing Anatomic variants: As described Review of the MIP images confirms the above findings These results were called by telephone at the time of interpretation on 05/18/2020 at 5:20 pm to provider [facility] , who verbally acknowledged these results. IMPRESSION: CTA neck: 1. The bilateral common and internal carotid arteries are patent within the neck without hemodynamically significant stenosis. Mild atherosclerotic disease within  the carotid systems as described. 2. Vertebral arteries patent within the neck. Moderate atherosclerotic  narrowing at the origin of the dominant right vertebral artery. 3. Partially imaged patulous and fluid-filled esophagus. CTA head: 1. Intracranial atherosclerotic disease with multifocal stenoses, most notably as follows. 2. Severe atherosclerotic disease within the intracranial left vertebral artery with sites of occlusive or near occlusive stenosis. 3. Severe atherosclerotic disease within the intracranial right vertebral artery with sites of severe stenosis. 4. Multifocal stenoses within the bilateral posterior cerebral arteries. Most notably, there is a severe stenosis within the P3 right PCA. Moderate stenosis within the left PCA at the P1/P2 junction. Severe stenosis within the P4 left PCA. 5. Sites of severe stenosis within a proximal to mid right M2 MCA branch. 6. Sites of moderate to moderately severe stenosis within proximal to mid left M2 MCA branches. 7. Up to moderate stenosis within the cavernous/paraclinoid right ICA. Electronically Signed   By: Jackey Loge DO   On: 05/18/2020 17:20   DG ESOPHAGUS W SINGLE CM (SOL OR THIN BA)  Result Date: 05/19/2020 CLINICAL DATA:  75 year old male with history of cough. History of stroke EXAM: ESOPHOGRAM/BARIUM SWALLOW TECHNIQUE: Single contrast examination was performed using  thin barium. FLUOROSCOPY TIME:  Fluoroscopy Time:  1 minutes and 30 seconds COMPARISON:  None. FINDINGS: Limited single contrast esophagram was performed due to lack of patient mobility. Within the limitations of today's examination, the esophagus is diffusely dilated and tortuous, with a large amount of retained food and debris in the proximal to mid esophagus. Failure to propagate any normal primary peristaltic waves, and extensive tertiary contractions were observed throughout the examination. Given the limitations of today's examination, a proximal to mid esophageal mass would be  difficult to entirely exclude, although none was definitively identified. No stricture or esophageal ring. No hiatal hernia. IMPRESSION: 1. Limited study demonstrating a severe esophageal motility disorder with extensive tertiary contractions. 2. Dilated and tortuous esophagus with large amount of retained food and debris in the proximal to mid esophagus. Electronically Signed   By: Trudie Reed M.D.   On: 05/19/2020 15:06    PHYSICAL EXAM  Temp:  [97.7 F (36.5 C)-98.2 F (36.8 C)] 97.8 F (36.6 C) (04/22 0702) Pulse Rate:  [56-89] 82 (04/22 1530) Resp:  [12-22] 14 (04/22 1530) BP: (94-177)/(71-90) 108/73 (04/22 1530) SpO2:  [94 %-100 %] 100 % (04/22 1530) Weight:  [77.7 kg] 77.7 kg (04/21 1700)  General - Well nourished, well developed, in no apparent distress.  Ophthalmologic - fundi not visualized due to noncooperation.  Cardiovascular - Regular rhythm and rate.  Mental Status -  Level of arousal and orientation to year, age and person were intact, however, not orientated to months and place. Language including expression, naming, repetition, comprehension was assessed and found intact.  However moderate to severe dysarthria  Cranial Nerves II - XII - II - Visual field intact OU. III, IV, VI - Extraocular movements intact. V - Facial sensation intact bilaterally. VII - Facial movement intact bilaterally. VIII - Hearing & vestibular intact bilaterally. X - Palate elevates symmetrically.  Poor denture without teeth XI - Chin turning & shoulder shrug intact bilaterally. XII - Tongue protrusion intact.  Motor Strength - The patient's strength was symmetrical in all extremities about 4/5 and pronator drift was absent.  Bulk was decreased and fasciculations were absent.   Motor Tone - Muscle tone was assessed at the neck and appendages and was normal.  Reflexes - The patient's reflexes were symmetrical in all extremities and he had no pathological reflexes.  Sensory - Light  touch,  temperature/pinprick were assessed and were symmetrical subjectively.    Coordination - The patient had normal movements in the hands with no ataxia or dysmetria but slow action.  Tremor was absent.  Gait and Station - deferred.   ASSESSMENT/PLAN Mr. Leanord Asalhomas L Kirsten is a 75 y.o. male with history of hypertension, diabetes, CKD, PVD, CAD, A. fib on Eliquis, AAA, vascular dementia admitted for vomiting followed by right facial droop, slurred speech. No tPA given due to also window.    Possible TIA in the setting of severe intracranial stenosis and relatively low BP  Resultant near baseline  CT head no acute abnormality  CTA head and neck bilateral VA severe stenosis, left more than right.  Bilateral PCA multifocal stenosis.  Moderate right siphon stenosis, bilateral M2 severe stenosis.  MRI no acute infarct  2D Echo pending  EEG moderate diffuse background slowing, no seizure  LDL 102  HgbA1c 5.7  Eliquis for VTE prophylaxis  Eliquis (apixaban) daily prior to admission, now on Eliquis (apixaban) daily.   Ongoing aggressive stroke risk factor management  Therapy recommendations: SNF  Disposition: Pending  PAF  Rate controlled  Currently in sinus  Eliquis PTA, continue Eliquis for stroke prevention  Diabetes  HgbA1c 5.7, goal < 7.0  Controlled  CBG monitoring  SSI  close PCP follow up  Hx of hypertension Hypotension . Stable on the low side . Could be related to patient recent weight loss, chronic dysphagia and n.p.o. status . Given severe intracranial stenosis, recommend IV fluid and avoid low BP  Long term BP goal 130-150 given severe intracranial stenosis  Hyperlipidemia  Home meds: Lipitor 40  LDL 102, goal < 70  Now on Lipitor 80  Continue statin at discharge  History of stroke  12/2011 admitted for slurred speech.  CT no acute abnormality.  MRI showed left pontine infarct.  Carotid Doppler negative.  MRI showed 50% VA proximal  stenosis, 75% right siphon stenosis, bilateral VA stenosis left more than right.  Carotid Doppler negative.  LDL 129.  Chronic dysphagia   GI on board  Had modified barium study today  Speech following  Currently n.p.o.  Consider IV fluid  May consider core track placement for tube feeding if not passing swallow.  Other Stroke Risk Factors  Advanced age  CAD  PAD  Other Active Problems  Vascular dementia  Recent weight loss and functional decline, [facility]  Hospital day # 0  Neurology will sign off. Please call with questions. [facility]. Thanks for the consult.   Marvel PlanJindong Orlen Leedy, MD PhD Stroke Neurology 05/19/2020 3:43 PM    To contact Stroke Continuity provider, please refer to [email or web address]. After hours, contact General Neurology

## 2020-05-19 NOTE — ED Notes (Signed)
Breakfast Ordered 

## 2020-05-19 NOTE — ED Notes (Signed)
Patient transported with all belongings to [phone] by transporter and with daughter. Patient alert and aware of need for admission at this time. VSS. See flowsheet.

## 2020-05-19 NOTE — Progress Notes (Signed)
Received from ED on cart accompanied by daughter.  Oriented to room.  No signs of distress.

## 2020-05-19 NOTE — Progress Notes (Signed)
Occupational Therapy Evaluation Patient Details Name: Brian Burns MRN: [id number] DOB: August 21, 1945 Today's Date: 05/19/2020    History of Present Illness Pt is a 75 y/o male admitted 4/21 secondary to R facial droop and emesis. MRI negative for acute abnormality. PMH includes CVA (residual dysarthria), vascular dementia, HTN, DM, a fib, CAD, CKD, PVD, and GERD.   Clinical Impression   Pt is a resident of [facility] and requires assistance for mobility and ADL at baseline. Required +2Mod A for sit - stand and is total A for LB ADL and Mod A with UB ADL. Able to feed himself after set up. Recommend DC back to SNF with rehab if his PLOF has declined. No further acute OT needed.     Follow Up Recommendations  Supervision/Assistance - 24 hour;SNF    Equipment Recommendations  None recommended by OT    Recommendations for Other Services       Precautions / Restrictions Precautions Precautions: Fall      Mobility Bed Mobility Overal bed mobility: Needs Assistance Bed Mobility: Supine to Sit;Sit to Supine     Supine to sit: Mod assist;+2 for physical assistance Sit to supine: Mod assist;+2 for physical assistance   General bed mobility comments: Mod A +2 for trunk and LE assist. Increased time required.    Transfers Overall transfer level: Needs assistance Equipment used: 2 person hand held assist Transfers: Sit to/from Stand Sit to Stand: +2 physical assistance;Mod assist         General transfer comment: mod A +2 for lift assist and steadying. Flexed posture and required assist for upright posture. Pt unable to maintain standing for long period. Stood X3-4.    Balance Overall balance assessment: Needs assistance Sitting-balance support: Feet supported Sitting balance-Leahy Scale: Fair     Standing balance support: Bilateral upper extremity supported Standing balance-Leahy Scale: Poor Standing balance comment: Reliant on BUE and external support                            ADL either performed or assessed with clinical judgement   ADL Overall ADL's : Needs assistance/impaired Eating/Feeding: Set up;Sitting;Bed level   Grooming: Set up;Bed level   Upper Body Bathing: Minimal assistance;Bed level   Lower Body Bathing: Maximal assistance;Bed level   Upper Body Dressing : Moderate assistance;Bed level   Lower Body Dressing: Total assistance;Bed level   Toilet Transfer: +2 for physical assistance;Moderate assistance   Toileting- Clothing Manipulation and Hygiene: Total assistance Toileting - Clothing Manipulation Details (indicate cue type and reason): indwelling foley     Functional mobility during ADLs: +2 for physical assistance;Moderate assistance       Vision   Additional Comments: most likley at [employer]     Praxis      Pertinent Vitals/Pain Pain Assessment: Faces Faces Pain Scale: No hurt     Hand Dominance Right   Extremity/Trunk Assessment Upper Extremity Assessment Upper Extremity Assessment: RUE deficits/detail RUE Deficits / Details: shoulder flex to 80, weak grip; hx of hemiparesis RUE Coordination: decreased fine motor   Lower Extremity Assessment Lower Extremity Assessment: Defer to PT evaluation   Cervical / Trunk Assessment Cervical / Trunk Assessment: Kyphotic (posterior bias)   Communication Communication Communication: Expressive difficulties   Cognition Arousal/Alertness: Awake/alert Behavior During Therapy: WFL for tasks assessed/performed;Flat affect Overall Cognitive Status: No family/caregiver present to determine baseline cognitive functioning Area of Impairment: Orientation  Orientation Level: Disoriented to;Time;Place;Situation     Following Commands: Follows one step commands with increased time   Awareness: Intellectual   General Comments: Oriented to person only. Dementia at baseline. Slow processing. Likely close to his  baseline.   General Comments       Exercises     Shoulder Instructions      Home Living Family/patient expects to be discharged to:: Skilled nursing facility                                        Prior Functioning/Environment Level of Independence: Needs assistance  Gait / Transfers Assistance Needed: Pt reports using WC, walker, and cane. Reports staff sometimes assists. Unsure of accuracy. Anticipate pt was mostly using WC and required assist. ADL's / Homemaking Assistance Needed: Pt reports staff helps with bathing and other ADLs.            OT Problem List: Decreased strength;Decreased range of motion;Decreased activity tolerance;Impaired balance (sitting and/or standing);Decreased cognition;Decreased safety awareness;Decreased knowledge of use of DME or AE;Cardiopulmonary status limiting activity;Impaired UE functional use      OT Treatment/Interventions:      OT Goals(Current goals can be found in the care plan section) Acute Rehab OT Goals Patient Stated Goal: none stated OT Goal Formulation: All assessment and education complete, DC therapy (Continue at SNF)  OT Frequency:     Barriers to D/C:            Co-evaluation PT/OT/SLP Co-Evaluation/Treatment: Yes Reason for Co-Treatment: For patient/therapist safety;To address functional/ADL transfers   OT goals addressed during session: ADL's and self-care      AM-PAC OT "6 Clicks" Daily Activity     Outcome Measure Help from another person eating meals?: A Little Help from another person taking care of personal grooming?: A Little Help from another person toileting, which includes using toliet, bedpan, or urinal?: Total Help from another person bathing (including washing, rinsing, drying)?: A Lot Help from another person to put on and taking off regular upper body clothing?: A Lot Help from another person to put on and taking off regular lower body clothing?: Total 6 Click Score: 12   End of  Session Nurse Communication: Mobility status  Activity Tolerance: Patient tolerated treatment well Patient left: in bed;with call bell/phone within reach  OT Visit Diagnosis: Muscle weakness (generalized) (M62.81)                Time: 7341-9379 OT Time Calculation (min): 22 min Charges:  OT General Charges $OT Visit: 1 Visit OT Evaluation $OT Eval Moderate Complexity: 1 Mod  Marquie Aderhold, OT/L   Acute OT Clinical Specialist Acute Rehabilitation Services Pager [phone] Office [phone]   [facility] 05/19/2020, 2:31 PM

## 2020-05-19 NOTE — Hospital Course (Addendum)
Brian Burns is a 75 y.o. male presenting with facial droop and emesis. PMH is significant for CVA (with residual dysarthria), vascular dementia, HTN, T2DM, PAF, CAD, CKD stage IIIa, PVD with claudication, GERD.   Acute right-sided hemiparesis Patient with prior history of CVA with residual dysarthria as well as underlying vascular dementia presenting as a code stroke with sudden onset witnessed right-sided hemiparesis with right-sided facial droop and worsening dysarthria in the setting of vomiting and reported hypotension, now resolved and at baseline per daughter.  Clinically stable at this time with improvement in his neurologic deficits.  CT head without acute abnormalities and CTA head/neck with multiple areas of stenoses but no vessel occlusions.  tPA contraindicated given that he is on anticoagulation. Home lisinopril held due to concern for stroke. Neuro consulted, recommended EEG given history of discrete seizures. EEG unremarkable for acute epileptic findings. Neuro checks stable through admission, stopped 4/24. Lisinopril continued to be held because patient normotensive. Per neurology, systolic BP goal 176-160 due to severe intracranial stenosis.  Weight loss and dysphagia Per chart review, patient has lost 9 pounds within the past month and 42 pounds over the course of 9 months since last July.  Had a recent ED visit 1 month ago on 3/9, he was noted to have daily postprandial vomiting once a day, but not with every meal.  CTA neck did reveal partially imaged patulous and fluid-filled esophagus, concerning for obstructive cause for his vomiting and weight loss such as esophageal stricture or malignancy.  Patient last had an EGD with [facility] in 2017 when he was hospitalized for a GI bleed, found to have a gastric AVM. EGD demonstrated abnormal esophageal motility (food found in esophagus), distal esophageal web (dilated), small hiatal hernia, endoclip found in stomach, atrophic gastritis  (biopsied). GI recommended full liquid diet and consideration of feeding tube. Patient declined feeding tube at this time.   Asymptomatic bacturia Urine culture collected 4/22 found to grow >80,000 colonies E. Coli, although patient without symptoms.No antibiotics initiated. Will recommend outpatient follow up.

## 2020-05-19 NOTE — Evaluation (Signed)
Physical Therapy Evaluation Patient Details Name: Brian Burns MRN: [id number] DOB: 1946-01-11 Today's Date: 05/19/2020   History of Present Illness  Pt is a 75 y/o male admitted 4/21 secondary to R facial droop and emesis. MRI negative for acute abnormality. PMH includes CVA (residual dysarthria), vascular dementia, HTN, DM, a fib, CAD, CKD, PVD, and GERD.  Clinical Impression  Pt admitted secondary to problem above with deficits below. Pt with dementia at baseline, so unsure of PLOF, but per notes was at [place]. Required min to mod A +2 for transfers this session. Pt unable to maintain standing for long periods. Recommend return to SNF at d/c. Will continue to follow acutely.     Follow Up Recommendations SNF    Equipment Recommendations  None recommended by PT    Recommendations for Other Services       Precautions / Restrictions Precautions Precautions: Fall Restrictions Weight Bearing Restrictions: No      Mobility  Bed Mobility Overal bed mobility: Needs Assistance Bed Mobility: Supine to Sit;Sit to Supine     Supine to sit: Mod assist;+2 for physical assistance Sit to supine: Mod assist;+2 for physical assistance   General bed mobility comments: Mod A +2 for trunk and LE assist. Increased time required.    Transfers Overall transfer level: Needs assistance Equipment used: 2 person hand held assist Transfers: Sit to/from Stand Sit to Stand: +2 physical assistance;Min assist;Mod assist         General transfer comment: Min to mod A +2 for lift assist and steadying. Flexed posture and required assist for upright posture. Pt unable to maintain standing for long period. Stood X3-4.  Ambulation/Gait                Stairs            Wheelchair Mobility    Modified Rankin (Stroke Patients Only)       Balance Overall balance assessment: Needs assistance Sitting-balance support: Feet supported Sitting balance-Leahy Scale: Fair      Standing balance support: Bilateral upper extremity supported Standing balance-Leahy Scale: Poor Standing balance comment: Reliant on BUE and external support                             Pertinent Vitals/Pain Pain Assessment: Faces Faces Pain Scale: No hurt    Home Living Family/patient expects to be discharged to:: Skilled nursing facility                      Prior Function Level of Independence: Needs assistance   Gait / Transfers Assistance Needed: Pt reports using WC, walker, and cane. Reports staff sometimes assists. Unsure of accuracy. Anticipate pt was mostly using WC and required assist.  ADL's / Homemaking Assistance Needed: Pt reports staff helps with bathing and other ADLs.        Hand Dominance        Extremity/Trunk Assessment   Upper Extremity Assessment Upper Extremity Assessment: Defer to OT evaluation    Lower Extremity Assessment Lower Extremity Assessment: Generalized weakness    Cervical / Trunk Assessment Cervical / Trunk Assessment: Kyphotic  Communication   Communication: Expressive difficulties  Cognition Arousal/Alertness: Awake/alert Behavior During Therapy: WFL for tasks assessed/performed;Flat affect Overall Cognitive Status: No family/caregiver present to determine baseline cognitive functioning Area of Impairment: Orientation                 Orientation Level: Disoriented to;Time;Place;Situation  General Comments: Oriented to person only. Dementia at baseline. Slow processing. Likely close to his baseline.      General Comments      Exercises     Assessment/Plan    PT Assessment Patient needs continued PT services  PT Problem List Decreased strength;Decreased range of motion;Decreased activity tolerance;Decreased balance;Decreased mobility;Decreased cognition;Decreased knowledge of use of DME;Decreased safety awareness;Decreased knowledge of precautions       PT Treatment  Interventions DME instruction;Therapeutic activities;Gait training;Therapeutic exercise;Patient/family education;Functional mobility training;Wheelchair mobility training;Balance training    PT Goals (Current goals can be found in the Care Plan section)  Acute Rehab PT Goals Patient Stated Goal: none stated PT Goal Formulation: With patient Time For Goal Achievement: 06/02/20 Potential to Achieve Goals: Fair    Frequency Min 2X/week   Barriers to discharge        Co-evaluation PT/OT/SLP Co-Evaluation/Treatment: Yes Reason for Co-Treatment: For patient/therapist safety;To address functional/ADL transfers PT goals addressed during session: Mobility/safety with mobility;Balance         AM-PAC PT "6 Clicks" Mobility  Outcome Measure Help needed turning from your back to your side while in a flat bed without using bedrails?: A Lot Help needed moving from lying on your back to sitting on the side of a flat bed without using bedrails?: A Lot Help needed moving to and from a bed to a chair (including a wheelchair)?: A Lot Help needed standing up from a chair using your arms (e.g., wheelchair or bedside chair)?: A Lot Help needed to walk in hospital room?: Total Help needed climbing 3-5 steps with a railing? : Total 6 Click Score: 10    End of Session   Activity Tolerance: Patient tolerated treatment well Patient left: in bed;with call bell/phone within reach (on stretcher in ED) Nurse Communication: Mobility status PT Visit Diagnosis: Unsteadiness on feet (R26.81);Difficulty in walking, not elsewhere classified (R26.2);Muscle weakness (generalized) (M62.81)    Time: 4401-0272 PT Time Calculation (min) (ACUTE ONLY): 22 min   Charges:   PT Evaluation $PT Eval Moderate Complexity: 1 Mod          Farley Ly, PT, DPT  Acute Rehabilitation Services  Pager: [phone] Office: [phone]   Brian Burns 05/19/2020, 9:42 AM

## 2020-05-19 NOTE — Progress Notes (Signed)
  Echocardiogram 2D Echocardiogram has been performed.  Shirlean Kelly 05/19/2020, 12:19 PM

## 2020-05-19 NOTE — Procedures (Signed)
Patient Name: Brian Burns  MRN: [id number]  Epilepsy Attending: Charlsie Quest  Referring Physician/Provider: Dr. Jimmye Norman, NP Date: 05/18/2020  Duration: 37.33 mins  Patient history: 75 year old male with history of seizures who presented with acute onset of right facial droop and weakness.  EEG to evaluate for seizures.  Level of alertness: Awake  AEDs during EEG study: None  Technical aspects: This EEG study was done with scalp electrodes positioned according to the 10-20 International system of electrode placement. Electrical activity was acquired at a sampling rate of 500Hz  and reviewed with a high frequency filter of 70Hz  and a low frequency filter of 1Hz . EEG data were recorded continuously and digitally stored.   Description: No clear posterior dominant rhythm was seen. EEG showed continuous generalized 3 to 6 Hz theta-delta slowing. Hyperventilation and photic stimulation were not performed.     ABNORMALITY - Continuous slow, generalized  IMPRESSION: This study is suggestive of moderate diffuse encephalopathy, nonspecific etiology. No seizures or epileptiform discharges were seen throughout the recording.  Kennisha Qin 

## 2020-05-19 NOTE — ED Notes (Signed)
Patient care received. Patient's daughter attentive and caring at bedside. Patient repositioned up on stretcher w clean sheets and placed in hospital gown. On appropriate monitors. Patient w call bell within reach and side rails up x 2. Bed in low and locked position. Peri care done.

## 2020-05-19 NOTE — ED Notes (Signed)
Patient returned from MRI.

## 2020-05-19 NOTE — Progress Notes (Signed)
SLP Note:   Order received for swallowing evaluation. Discussed with radiology, who shares that Dr. Levora Angel would like to have a modified barium swallow study completed in addition to the barium swallow. They can complete that this afternoon, so will plan to proceed directly to [facility].     Mahala Menghini., M.A. CCC-SLP Acute Herbalist [phone] Office [phone]

## 2020-05-19 NOTE — Progress Notes (Addendum)
Family Medicine Teaching Service Daily Progress Note Intern Pager: [phone]  Patient name: Brian Burns Medical record number: [id number] Date of birth: December 08, 1945 Age: 75 y.o. Gender: male  Primary Care Provider: System, Provider Not In Consultants: Neuro, GI Code Status: Partial (DO NOT INTUBATE)  Pt Overview and Major Events to Date:  4/21 admitted, code stroke activated  Assessment and Plan: MONTELL LEOPARD is a 75 y.o. male presenting with facial droop and emesis. PMH is significant for CVA (with residual dysarthria), vascular dementia, HTN, T2DM, PAF, CAD, CKD stage IIIa, PVD with claudication, GERD.   Acute right-sided hemiparesis Patient has no complaints, patient does not complain of any focal deficits.  Patient does have some right-sided facial drooping and dysarthria that he believes is residual from prior strokes.  TTE today demonstrates EF 50%, LV mildly decreased function, inferior lateral hypokinesis, grade 1 diastolic dysfunction.  Lipid panel demonstrates HDL 38, LDL 102.  Vitamin B12 normal.  CBC, CMP largely unremarkable with creatinine 1.36, hemoglobin 10.2.  Per neurology, systolic BP goal 646-803. - Continue home rivaroxaban - Continue thiamine  -Increase home statin from 40 to 80 - DC B12 due to normal levels - Neurochecks every 4 - Continuous telemetry - PT/OT/SLP - vitals per unit routine  Weight loss Weight 77.7 kg on admission.   - GI consulted, appreciate recommendations - SLP consulted, barium swallow  History of seizure EEG reviewed by neurology, unremarkable, negative for seizure activity.  BPH with urethral stricture Continue home tamsulosin and finasteride.  Foley in place.  HTN Normotensive last 24 hours.  3 recorded hypertensive measurements overnight, quickly resolved.  Still holding home lisinopril.  T2DM No home meds.  Glucose today 94.  PAF Continue home Eliquis.  HLD Home atorvastatin 40 mg daily.  Normocytic  anemia Hemoglobin today 10.2, stable from admission. - Monitor a.m. CBCs  Chronic pancreatitis Continue home Creon.  GERD Famotidine daily as needed.  Depression Continue home citalopram.  Chronic low back pain - Continue home duloxetine 30 mg nightly - Hold home tramadol  Vitamin D deficiency Holding cholecalciferol.   FEN/GI: Heart healthy carb modified PPx: Eliquis   Status is: Observation  The patient remains OBS appropriate and will d/c before 2 midnights.  Dispo: The patient is from: SNF              Anticipated d/c is to: SNF              Patient currently is not medically stable to d/c.   Difficult to place patient No  Subjective:  Patient has been in the ED room, appears comfortable.  He felt says that he feels normal now, only complaint is that his right forearm is tingly and falling asleep.  Does not have any complaints of focal deficits or weakness.  Difficult understand due to articulation and dentition.  Objective: Temp:  [97.7 F (36.5 C)-98.2 F (36.8 C)] 97.8 F (36.6 C) (04/22 0702) Pulse Rate:  [56-89] 71 (04/22 1215) Resp:  [12-22] 13 (04/22 1215) BP: (94-177)/(71-90) 94/73 (04/22 1215) SpO2:  [94 %-100 %] 100 % (04/22 1215) Weight:  [77.7 kg] 77.7 kg (04/21 1700) Physical Exam: General: Awake, alert, no acute distress Cardiovascular: Regular rate and rhythm, no murmurs Respiratory: CTA B Extremities: Grip strength 5/5 equal, BUE 5/5 equal  Laboratory: Recent Labs  Lab 05/18/20 1630 05/18/20 1640 05/19/20 0439  WBC 7.6  --  5.6  HGB 10.6* 10.9* 10.2*  HCT 34.3* 32.0* 31.9*  PLT 162  --  164   Recent Labs  Lab 05/18/20 1630 05/18/20 1640 05/19/20 0439  NA 135 134* 134*  K 4.1 4.1 4.1  CL 106 105 102  CO2 20*  --  25  BUN 17 20 17   CREATININE 1.45* 1.40* 1.36*  CALCIUM 8.7*  --  8.7*  PROT 6.1*  --   --   BILITOT 0.8  --   --   ALKPHOS 81  --   --   ALT 17  --   --   AST 18  --   --   GLUCOSE 129* 126* 94    Imaging/Diagnostic Tests:  MRI HEAD WITHOUT CONTRAST 05/18/2020 IMPRESSION: 1. No acute intracranial abnormality. 2. Severe atrophy and chronic ischemic microangiopathy.  CT HEAD WITHOUT CONTRAST 05/18/20 IMPRESSION: 1. No acute abnormality 2. ASPECTS is 10 3. Moderate atrophy and chronic microvascular ischemic change. No change from the prior study. 4. Extensive intracranial atherosclerotic calcification. 5. Code stroke imaging results were communicated on 05/18/2020 at 4:45 pm to provider Bhagat via text message  CT ANGIOGRAPHY HEAD AND NECK 05/18/20 IMPRESSION: CTA neck: 1. The bilateral common and internal carotid arteries are patent within the neck without hemodynamically significant stenosis. Mild atherosclerotic disease within the carotid systems as described. 2. Vertebral arteries patent within the neck. Moderate atherosclerotic narrowing at the origin of the dominant right vertebral artery. 3. Partially imaged patulous and fluid-filled esophagus. CTA head: 1. Intracranial atherosclerotic disease with multifocal stenoses, most notably as follows. 2. Severe atherosclerotic disease within the intracranial left vertebral artery with sites of occlusive or near occlusive stenosis. 3. Severe atherosclerotic disease within the intracranial right vertebral artery with sites of severe stenosis. 4. Multifocal stenoses within the bilateral posterior cerebral arteries. Most notably, there is a severe stenosis within the P3 right PCA. Moderate stenosis within the left PCA at the P1/P2 junction. Severe stenosis within the P4 left PCA. 5. Sites of severe stenosis within a proximal to mid right M2 MCA branch. 6. Sites of moderate to moderately severe stenosis within proximal to mid left M2 MCA branches. 7. Up to moderate stenosis within the cavernous/paraclinoid right ICA.   05/20/20, MD 05/19/2020, 1:58 PM PGY-1, [facility] Family Medicine FPTS Intern pager: [phone],  text pages welcome

## 2020-05-19 NOTE — Consult Note (Signed)
Referring Provider:  Family Medicine  Primary Care Physician:  System, Provider Not In Primary Gastroenterologist: [facility]  Reason for Consultation: Dysphagia  HPI: Brian Burns is a 75 y.o. male with past medical history of CVA with residual dysarthria, history of vascular dementia, history of atrial fibrillation on Xarelto, history of coronary artery disease and peripheral vascular disease as well as chronic kidney disease presented to the hospital yesterday with right-sided weakness.  GI is consulted for further evaluation of dysphagia.  Blood work on admission showed stable hemoglobin at 10.6, INR 1.6, creatinine 1.45 and normal LFTs.  Patient seen and examined at bedside in the emergency room.  Limited history obtained.  Patient is complaining of coughing and choking while swallowing but denies any issues with food getting stuck in esophagus.  Denies abdominal pain, diarrhea or constipation.  Denies blood in the stool or black stool.  EGD in 2017 showed bleeding AVM but otherwise no evidence of esophageal stricture.  Past Medical History:  Diagnosis Date  . AAA (abdominal aortic aneurysm) without rupture (HCC) 06/2014   no change in last exam from 2015  . Achalasia   . Anxiety   . Arthritis    knees,lower back  . Atrial fibrillation (HCC)   . Atrial flutter with rapid ventricular response (HCC) 01/16/2015  . Bilateral wrist pain 09/09/2014  . Bladder outlet obstruction 01/20/2015  . Bradycardia 08/27/2016  . CAD (coronary artery disease)    pt denies  . Carotid artery disease (HCC)   . Chronic back pain   . Chronic kidney disease (CKD), stage III (moderate) (HCC)   . Decreased visual acuity of left eye  12/02/2018  . Depression   . Dyspnea    reports today no pregoression  . Erectile dysfunction   . GERD (gastroesophageal reflux disease)   . Headache    hx of  . History of CVA (cerebrovascular accident)    2009  &  2013  . History of Multiple lacunar infarcts (HCC)  01/31/2015   MRI Brain 01/31/2015: Widespread old lacunar infarcts, particularly right Basal ganglia, pons, and right cerebellum  . HLD (hyperlipidemia)   . Hypertension   . Idiopathic pancreatitis 12/2014   acute  . Knee pain, bilateral 04/09/2011  . Malnutrition of moderate degree 01/25/2015  . Memory impairment    mild dementia per PCP progress note   . Morbid obesity (HCC) 12/03/2018  . Other abnormalities of gait and mobility 12/03/2018  . PVD (peripheral vascular disease) with claudication (HCC)   . Urethral stricture 02/16/2013  . Urge incontinence 12/08/2018    Past Surgical History:  Procedure Laterality Date  . back injections     [facility]  . BALLOON DILATION N/A 12/16/2018   Procedure: URETHRAL BALLOON DILATION;  Surgeon: Crist Fat, MD;  Location: [facility];  Service: Urology;  Laterality: N/A;  . CARDIOVASCULAR STRESS TEST  08-09-2009   mild global hypokinesis,  no ischemia or scar/  ef 41%  . CATARACT EXTRACTION W/ INTRAOCULAR LENS  IMPLANT, BILATERAL  april and may 2014  . CYSTOSCOPY N/A 01/17/2017   Procedure: CYSTOSCOPY FLEXIBLE;  Surgeon: Crist Fat, MD;  Location: [facility];  Service: Urology;  Laterality: N/A;  . CYSTOSCOPY WITH RETROGRADE URETHROGRAM N/A 11/24/2014   Procedure: CYSTOSCOPY WITH RETROGRADE URETHROGRAM;  Surgeon: Crist Fat, MD;  Location[facility];  Service: Urology;  Laterality: N/A;  . CYSTOSCOPY WITH RETROGRADE URETHROGRAM N/A 12/16/2018   Procedure: CYSTOSCOPY WITH RETROGRADE URETHROGRAM;  Surgeon: Marlou Porch,  Earle GellBenjamin W, MD;  Location: [facility];  Service: Urology;  Laterality: N/A;  . CYSTOSCOPY WITH RETROGRADE URETHROGRAM N/A 12/16/2019   Procedure: CYSTOSCOPY WITH RETROGRADE, [employer] Internal Urethrotomy;  Surgeon: Crist FatHerrick, Benjamin W, MD;  Location: [facility];  Service: Urology;  Laterality: N/A;  . CYSTOSCOPY WITH URETHRAL DILATATION N/A 11/24/2014   Procedure: CYSTOSCOPY WITH URETHRAL  DILATATION;  Surgeon: Crist FatBenjamin W Herrick, MD;  [facility] ;  Service: Urology;  Laterality: N/A;  BALLOON DILATION        . ESOPHAGOGASTRODUODENOSCOPY N/A 05/15/2014   Procedure: ESOPHAGOGASTRODUODENOSCOPY (EGD);  Surgeon: Carman ChingJames Edwards, MD;  [facility];  Service: Endoscopy;  Laterality: N/A;  . ESOPHAGOGASTRODUODENOSCOPY Left 02/02/2015   Procedure: ESOPHAGOGASTRODUODENOSCOPY (EGD);  Surgeon: Dorena CookeyJohn Hayes, MD;  [facility];  Service: Endoscopy;  Laterality: Left;  . ESOPHAGOGASTRODUODENOSCOPY Left 03/16/2015   Procedure: ESOPHAGOGASTRODUODENOSCOPY (EGD);  Surgeon: Dorena CookeyJohn Hayes, MD;  [facility];  Service: Endoscopy;  Laterality: Left;  . TRANSTHORACIC ECHOCARDIOGRAM  01-07-2012   mild to moderate dilated LV,  ef 45-50%,  mild basal hypokinesis,  grade I diastolic  dysfunction/  trivial AR/  mild MR  . URETHROPLASTY N/A 01/17/2017   Procedure: URETHEROPLASTY BUCCAL GRAFT AND BUCCAL MUCOSA GRAFT HARVEST;  Surgeon: Crist FatHerrick, Benjamin W, MD;  Location: [facility];  Service: Urology;  Laterality: N/A;    Prior to Admission medications   Medication Sig Start Date End Date Taking? Authorizing Provider  acetaminophen (TYLENOL) 325 MG tablet Take 325 mg by mouth every 6 (six) hours as needed for moderate pain.   Yes [provider]  apixaban (ELIQUIS) 5 MG TABS tablet Take 5 mg by mouth 2 (two) times daily.   Yes [provider]  atorvastatin (LIPITOR) 40 MG tablet TAKE 1 TABLET(40 MG) BY MOUTH DAILY Patient taking differently: Take 40 mg by mouth daily. 04/13/20  Yes Mullis, Kiersten P, DO  Cholecalciferol (VITAMIN D) 50 MCG (2000 UT) tablet Take 2,000 Units by mouth daily.   Yes [provider]  citalopram (CELEXA) 40 MG tablet Take 40 mg by mouth daily. 10/26/19  Yes [provider]  docusate sodium (COLACE) 100 MG capsule Take 100 mg by mouth 2 (two) times daily.   Yes [provider]  DULoxetine  (CYMBALTA) 30 MG capsule Take 30 mg by mouth at bedtime. Give with the 60 mg = 90 mg 02/01/20  Yes [provider]  DULoxetine (CYMBALTA) 60 MG capsule Take 1 capsule (60 mg total) by mouth at bedtime. For back pain/nerve pain Patient taking differently: Take 60 mg by mouth at bedtime. Give with the 30 mg = 90 mg for nerve/ back pain 11/29/19  Yes Lovorn, Megan, MD  famotidine (PEPCID) 20 MG tablet Take 1 tablet (20 mg total) by mouth daily. 04/05/20  Yes Caccavale, Sophia, PA-C  ferrous sulfate 324 MG TBEC Take 1 tablet (324 mg total) by mouth daily with breakfast. 06/15/19  Yes Medina-Vargas, Monina C, NP  finasteride (PROSCAR) 5 MG tablet Take 1 tablet (5 mg total) by mouth at bedtime. 06/15/19  Yes Medina-Vargas, Monina C, NP  lipase/protease/amylase (CREON) 12000-38000 units CPEP capsule TAKE 1 CAPSULE BY MOUTH THREE TIMES DAILY WITH MEALS Patient taking differently: Take 12,000 Units by mouth 3 (three) times daily with meals. 01/24/20  Yes Mullis, Kiersten P, DO  lisinopril (ZESTRIL) 10 MG tablet Take 10 mg by mouth at bedtime. Taking with 5 mg = 15 mg at bedtime 11/16/19  Yes [provider]  lisinopril (ZESTRIL) 10 MG  tablet Take 10 mg by mouth at bedtime. Taking with the 5 mg = 15 mg at bedtime   Yes [provider]  lisinopril (ZESTRIL) 5 MG tablet Take 1 tablet (5 mg total) by mouth at bedtime. 06/30/19  Yes Jackelyn Poling, DO  Multiple Vitamin (MULTIVITAMIN) capsule Take 1 capsule by mouth daily.   Yes [provider]  potassium chloride (KLOR-CON) 10 MEQ tablet Take 1 tablet (10 mEq total) by mouth daily. 04/05/20  Yes Caccavale, Sophia, PA-C  tamsulosin (FLOMAX) 0.4 MG CAPS capsule TAKE 1 CAPSULE(0.4 MG) BY MOUTH DAILY Patient taking differently: Take 0.4 mg by mouth daily. 06/15/19  Yes Medina-Vargas, Monina C, NP  traMADol (ULTRAM) 50 MG tablet Take 1 tablet (50 mg total) by mouth 3 (three) times daily as needed for moderate pain. 01/31/20  Yes Lovorn, Aundra Millet, MD     Scheduled Meds: . atorvastatin  40 mg Oral Daily  . citalopram  40 mg Oral Daily  . DULoxetine  30 mg Oral QHS  . finasteride  5 mg Oral QHS  . rivaroxaban  15 mg Oral Q supper  . sodium chloride flush  3 mL Intravenous Once  . tamsulosin  0.4 mg Oral Daily  . thiamine injection  100 mg Intravenous Daily  . vitamin B-12  1,000 mcg Oral Daily   Continuous Infusions: PRN Meds:.acetaminophen **OR** acetaminophen, famotidine  Allergies as of 05/18/2020 - Review Complete 05/18/2020  Allergen Reaction Noted  . Gabapentin Other (See Comments) 12/08/2018    Family History  Problem Relation Age of Onset  . Lung disease Brother   . Kidney disease Brother   . Heart disease Brother        died at 32  . Heart disease Mother        died at 30  . Kidney disease Mother   . Lung disease Mother     Social History   Socioeconomic History  . Marital status: Married    Spouse name: Jaquon Gingerich  . Number of children: 2   . Years of education: 7  . Highest education level: 7th grade  Occupational History  . 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 Occupation: B[employer]    Comment: on the weekends  Tobacco Use  . Smoking status: Former Smoker    Packs/day: 0.50    Years: 40.00    Pack years: 20.00    Types: Cigarettes    Quit date: 01/29/1999    Years since quitting: 21.3  . Smokeless tobacco: Never Used  Vaping Use  . Vaping Use: Never used  Substance and Sexual Activity  . Alcohol use: Never    Alcohol/week: 0.0 standard drinks  . Drug use: Never  . Sexual activity: Not Currently  Other Topics Concern  . Not on file  Social History Narrative   Mr Liskey lives with his wife, Merick Kelleher, in a one story, single family home dwelling with 4 steps into the home.    The [employer] receive Food & Nutrition Benefits and Social Security Income   Mr JAYLEEN AFONSO has given permission to share his private health information with his daughter, [name] ([phone]) 12/03/18   [name] is the agent for [name] Power of [name]- The document was signed by patient and Notarized in [facility] office on 12/03/18.    Ms Samule Ohm manages her father's finances   Social Determinants of Health   Financial Resource Strain: Not on file  Food Insecurity: Not on file  Transportation Needs: Not on file  Physical Activity: Not on file  Stress: Not on file  Social Connections: Not on file  Intimate Partner Violence: Not on file    Review of Systems: Limited review of system negative as mentioned in HPI  Physical Exam: Vital signs: Vitals:   05/19/20 0815 05/19/20 1000  BP:  111/84  Pulse: 73 67  Resp:  14  Temp:    SpO2: 100% 99%     General:   Alert,   pleasant and cooperative in NAD Lungs:  Clear throughout to auscultation.  Anterior exam on Heart:  Regular rate and rhythm; no murmurs, clicks, rubs,  or gallops. Abdomen: Soft, nontender, nondistended, bowel sounds present, no peritoneal signs Neuro : Alert and oriented x3 Psych : Mood and affect normal Rectal:  Deferred  GI:  Lab Results: Recent Labs    05/18/20 1630 05/18/20 1640 05/19/20 0439  WBC 7.6  --  5.6  HGB 10.6* 10.9* 10.2*  HCT 34.3* 32.0* 31.9*  PLT 162  --  164   BMET Recent Labs    05/18/20 1630 05/18/20 1640 05/19/20 0439  NA 135 134* 134*  K 4.1 4.1 4.1  CL 106 105 102  CO2 20*  --  25  GLUCOSE 129* 126* 94  BUN 17 20 17   CREATININE 1.45* 1.40* 1.36*  CALCIUM 8.7*  --  8.7*   LFT Recent Labs    05/18/20 1630  PROT 6.1*  ALBUMIN 3.0*  AST 18  ALT 17  ALKPHOS 81  BILITOT 0.8   PT/INR Recent Labs    05/18/20 1630  LABPROT 19.2*  INR 1.6*     Studies/Results: MR BRAIN WO CONTRAST  Result Date: 05/19/2020 CLINICAL DATA:  Slurred speech and right facial droop EXAM: MRI HEAD WITHOUT CONTRAST TECHNIQUE: Multiplanar, multiecho pulse sequences of the brain and surrounding structures were obtained without intravenous  contrast. COMPARISON:  Three hundred seventeen FINDINGS: Brain: No acute infarct, mass effect or extra-axial collection. No acute or chronic hemorrhage. Hyperintense T2-weighted signal is widespread throughout the white matter. Diffuse, severe atrophy. Multiple old small vessel infarcts, including the right basal ganglia and right cerebellum. The midline structures are normal. Vascular: Major flow voids are preserved. Skull and upper cervical spine: Normal calvarium and skull base. Visualized upper cervical spine and soft tissues are normal. Sinuses/Orbits:No paranasal sinus fluid levels or advanced mucosal thickening. No mastoid or middle ear effusion. Normal orbits. IMPRESSION: 1. No acute intracranial abnormality. 2. Severe atrophy and chronic ischemic microangiopathy. Electronically Signed   By: 05/21/2020 M.D.   On: 05/19/2020 00:30   EEG adult  Result Date: 05/19/2020 05/21/2020, MD     05/19/2020  8:58 AM Patient Name: Brian Burns MRN: [name] Attending: [id number] Referring Physician/Provider: Dr. Charlsie Quest, NP Date: 05/18/2020 Duration: 37.33 mins Patient history: 75 year old male with history of seizures who presented with acute onset of right facial droop and weakness.  EEG to evaluate for seizures. Level of alertness: Awake AEDs during EEG study: None Technical aspects: This EEG study was done with scalp electrodes positioned according to the 10-20 International system of electrode placement. Electrical activity was acquired at a sampling rate of 500Hz  and reviewed with a high frequency filter of 70Hz  and a low frequency filter of 1Hz . EEG data were recorded continuously and digitally stored. Description: No clear posterior dominant rhythm was seen. EEG showed continuous generalized 3 to 6 Hz theta-delta slowing. Hyperventilation  and photic stimulation were not performed.   ABNORMALITY - Continuous slow, generalized IMPRESSION: This study is suggestive of  moderate diffuse encephalopathy, nonspecific etiology. No seizures or epileptiform discharges were seen throughout the recording. Charlsie Quest   CT HEAD CODE STROKE WO CONTRAST  Result Date: 05/18/2020 CLINICAL DATA:  Code stroke.  Right-sided weakness. EXAM: CT HEAD WITHOUT CONTRAST TECHNIQUE: Contiguous axial images were obtained from the base of the skull through the vertex without intravenous contrast. COMPARISON:  CT head 04/05/2020 FINDINGS: Brain: Moderate atrophy. Moderate patchy white matter hypodensity bilaterally unchanged from the prior study. Negative for acute infarct, hemorrhage, mass Vascular: Negative for hyperdense vessel. Extensive atherosclerotic calcification in the carotid and vertebral arteries bilaterally. Skull: Negative Sinuses/Orbits: Paranasal sinuses clear. Bilateral cataract extraction Other: None ASPECTS (Alberta Stroke Program Early CT Score) - Ganglionic level infarction (caudate, lentiform nuclei, internal capsule, insula, M1-M3 cortex): 7 - Supraganglionic infarction (M4-M6 cortex): 3 Total score (0-10 with 10 being normal): 10 IMPRESSION: 1. No acute abnormality 2. ASPECTS is 10 3. Moderate atrophy and chronic microvascular ischemic change. No change from the prior study. 4. Extensive intracranial atherosclerotic calcification. 5. Code stroke imaging results were communicated on 05/18/2020 at 4:45 pm to provider Bhagat via text message Electronically Signed   By: Marlan Palau M.D.   On: 05/18/2020 16:46   CT ANGIO HEAD CODE STROKE  Result Date: 05/18/2020 CLINICAL DATA:  Neuro deficit, acute, stroke suspected. Additional history provided: Right-sided weakness. EXAM: CT ANGIOGRAPHY HEAD AND NECK TECHNIQUE: Multidetector CT imaging of the head and neck was performed using the standard protocol during bolus administration of intravenous contrast. Multiplanar CT image reconstructions and MIPs were obtained to evaluate the vascular anatomy. Carotid stenosis measurements  (when applicable) are obtained utilizing NASCET criteria, using the distal internal carotid diameter as the denominator. CONTRAST:  75 mL Omnipaque 350 intravenous contrast. COMPARISON:  Noncontrast head CT performed earlier today 05/18/2020. FINDINGS: CTA NECK FINDINGS Aortic arch: Standard aortic branching. Atherosclerotic plaque within the visualized aortic arch and proximal major branch vessels of the neck. No hemodynamically significant innominate or proximal subclavian artery stenosis. Right carotid system: CCA and ICA patent within the neck without stenosis. Minimal calcified plaque within the carotid bifurcation. Left carotid system: CCA and ICA patent within the neck. Mild atherosclerotic plaque within the carotid bifurcation and proximal ICA with less than 50% stenosis of the proximal ICA. Tortuosity of the cervical ICA. Vertebral arteries: Vertebral arteries patent within the neck. Calcified plaque results in moderate stenosis at the origin of the dominant right vertebral artery. Nonstenotic calcified plaque at the origin of the left vertebral artery. Skeleton: Cervical spondylosis. No acute bony abnormality or aggressive osseous lesion. Other neck: No neck mass or cervical lymphadenopathy. Thyroid unremarkable. Upper chest: No consolidation within the imaged lung apices. Partially imaged patulous and fluid-filled esophagus. Review of the MIP images confirms the above findings CTA HEAD FINDINGS Anterior circulation: The intracranial internal carotid arteries are patent. Calcified plaque within both vessels. Up to moderate stenosis within the cavernous/paraclinoid right ICA. Mild stenosis of the cavernous/paraclinoid left ICA. The M1 middle cerebral arteries are patent. No M2 proximal branch occlusion is identified. There is significant atherosclerotic irregularity of the M2 and more distal middle cerebral arteries bilaterally. This includes multiple sites of high-grade stenosis within a proximal to mid  right M2 MCA branch (series 10, image 14). Sites of moderate to moderately severe stenosis are also present within proximal to mid left M2 MCA branches (for instance as seen on series  11, image 58) (series 10, image 29). The anterior cerebral arteries are patent. Dominant left anterior cerebral artery. No intracranial aneurysm is identified. Posterior circulation: The right vertebral artery is patent. Prominent soft and calcified plaque within the right vertebral artery with sites of up to severe stenosis. Severe atherosclerotic disease within the intracranial left vertebral artery with sites of occlusive or near occlusive stenosis. The basilar artery is patent. The posterior cerebral arteries are patent. Atherosclerotic irregularity of the bilateral posterior cerebral arteries with multifocal stenoses. Most notably, there is a severe stenosis within the right posterior cerebral artery at the P3 segment (series 10, image 19). Moderate stenosis within the left PCA at the P2/P3 junction. A severe stenosis is also present within the P4 left posterior cerebral artery (series 10, image 25). Posterior communicating arteries are hypoplastic or absent bilaterally. Venous sinuses: Poorly assessed due to contrast bolus timing Anatomic variants: As described Review of the MIP images confirms the above findings These results were called by telephone at the time of interpretation on 05/18/2020 at 5:20 pm to provider [facility] , who verbally acknowledged these results. IMPRESSION: CTA neck: 1. The bilateral common and internal carotid arteries are patent within the neck without hemodynamically significant stenosis. Mild atherosclerotic disease within the carotid systems as described. 2. Vertebral arteries patent within the neck. Moderate atherosclerotic narrowing at the origin of the dominant right vertebral artery. 3. Partially imaged patulous and fluid-filled esophagus. CTA head: 1. Intracranial atherosclerotic disease with  multifocal stenoses, most notably as follows. 2. Severe atherosclerotic disease within the intracranial left vertebral artery with sites of occlusive or near occlusive stenosis. 3. Severe atherosclerotic disease within the intracranial right vertebral artery with sites of severe stenosis. 4. Multifocal stenoses within the bilateral posterior cerebral arteries. Most notably, there is a severe stenosis within the P3 right PCA. Moderate stenosis within the left PCA at the P1/P2 junction. Severe stenosis within the P4 left PCA. 5. Sites of severe stenosis within a proximal to mid right M2 MCA branch. 6. Sites of moderate to moderately severe stenosis within proximal to mid left M2 MCA branches. 7. Up to moderate stenosis within the cavernous/paraclinoid right ICA. Electronically Signed   By: Jackey Loge DO   On: 05/18/2020 17:20   CT ANGIO NECK CODE STROKE  Result Date: 05/18/2020 CLINICAL DATA:  Neuro deficit, acute, stroke suspected. Additional history provided: Right-sided weakness. EXAM: CT ANGIOGRAPHY HEAD AND NECK TECHNIQUE: Multidetector CT imaging of the head and neck was performed using the standard protocol during bolus administration of intravenous contrast. Multiplanar CT image reconstructions and MIPs were obtained to evaluate the vascular anatomy. Carotid stenosis measurements (when applicable) are obtained utilizing NASCET criteria, using the distal internal carotid diameter as the denominator. CONTRAST:  75 mL Omnipaque 350 intravenous contrast. COMPARISON:  Noncontrast head CT performed earlier today 05/18/2020. FINDINGS: CTA NECK FINDINGS Aortic arch: Standard aortic branching. Atherosclerotic plaque within the visualized aortic arch and proximal major branch vessels of the neck. No hemodynamically significant innominate or proximal subclavian artery stenosis. Right carotid system: CCA and ICA patent within the neck without stenosis. Minimal calcified plaque within the carotid bifurcation. Left  carotid system: CCA and ICA patent within the neck. Mild atherosclerotic plaque within the carotid bifurcation and proximal ICA with less than 50% stenosis of the proximal ICA. Tortuosity of the cervical ICA. Vertebral arteries: Vertebral arteries patent within the neck. Calcified plaque results in moderate stenosis at the origin of the dominant right vertebral artery. Nonstenotic calcified plaque at the  origin of the left vertebral artery. Skeleton: Cervical spondylosis. No acute bony abnormality or aggressive osseous lesion. Other neck: No neck mass or cervical lymphadenopathy. Thyroid unremarkable. Upper chest: No consolidation within the imaged lung apices. Partially imaged patulous and fluid-filled esophagus. Review of the MIP images confirms the above findings CTA HEAD FINDINGS Anterior circulation: The intracranial internal carotid arteries are patent. Calcified plaque within both vessels. Up to moderate stenosis within the cavernous/paraclinoid right ICA. Mild stenosis of the cavernous/paraclinoid left ICA. The M1 middle cerebral arteries are patent. No M2 proximal branch occlusion is identified. There is significant atherosclerotic irregularity of the M2 and more distal middle cerebral arteries bilaterally. This includes multiple sites of high-grade stenosis within a proximal to mid right M2 MCA branch (series 10, image 14). Sites of moderate to moderately severe stenosis are also present within proximal to mid left M2 MCA branches (for instance as seen on series 11, image 58) (series 10, image 29). The anterior cerebral arteries are patent. Dominant left anterior cerebral artery. No intracranial aneurysm is identified. Posterior circulation: The right vertebral artery is patent. Prominent soft and calcified plaque within the right vertebral artery with sites of up to severe stenosis. Severe atherosclerotic disease within the intracranial left vertebral artery with sites of occlusive or near occlusive  stenosis. The basilar artery is patent. The posterior cerebral arteries are patent. Atherosclerotic irregularity of the bilateral posterior cerebral arteries with multifocal stenoses. Most notably, there is a severe stenosis within the right posterior cerebral artery at the P3 segment (series 10, image 19). Moderate stenosis within the left PCA at the P2/P3 junction. A severe stenosis is also present within the P4 left posterior cerebral artery (series 10, image 25). Posterior communicating arteries are hypoplastic or absent bilaterally. Venous sinuses: Poorly assessed due to contrast bolus timing Anatomic variants: As described Review of the MIP images confirms the above findings These results were called by telephone at the time of interpretation on 05/18/2020 at 5:20 pm to provider [facility] , who verbally acknowledged these results. IMPRESSION: CTA neck: 1. The bilateral common and internal carotid arteries are patent within the neck without hemodynamically significant stenosis. Mild atherosclerotic disease within the carotid systems as described. 2. Vertebral arteries patent within the neck. Moderate atherosclerotic narrowing at the origin of the dominant right vertebral artery. 3. Partially imaged patulous and fluid-filled esophagus. CTA head: 1. Intracranial atherosclerotic disease with multifocal stenoses, most notably as follows. 2. Severe atherosclerotic disease within the intracranial left vertebral artery with sites of occlusive or near occlusive stenosis. 3. Severe atherosclerotic disease within the intracranial right vertebral artery with sites of severe stenosis. 4. Multifocal stenoses within the bilateral posterior cerebral arteries. Most notably, there is a severe stenosis within the P3 right PCA. Moderate stenosis within the left PCA at the P1/P2 junction. Severe stenosis within the P4 left PCA. 5. Sites of severe stenosis within a proximal to mid right M2 MCA branch. 6. Sites of moderate to  moderately severe stenosis within proximal to mid left M2 MCA branches. 7. Up to moderate stenosis within the cavernous/paraclinoid right ICA. Electronically Signed   By: Jackey Loge DO   On: 05/18/2020 17:20    Impression/Plan: -Chronic dysphagia in a patient with history of stroke with residual weakness and dysarthria.  Most likely oropharyngeal dysphagia. -History of atrial fibrillation.  On anticoagulation -History of coronary artery disease and peripheral vascular disease -Dementia  Recommendations -------------------------- -Recommend barium swallow for further evaluation.  If patient gets discharged, barium swallow can  be done as an outpatient.  His dysphagia is most likely oropharyngeal dysphagia from his CVA. -Recommend speech pathology evaluation, if not already done -Call us back if any abnormal findings seen on the barium swallow or if needed.   - GI will sign off.   LOS: 0 days   [name]  MD, FACP 05/19/2020, 11:10 AM  Contact #  [phone]

## 2020-05-20 DIAGNOSIS — F0150 Vascular dementia without behavioral disturbance: Secondary | ICD-10-CM | POA: Diagnosis present

## 2020-05-20 DIAGNOSIS — I480 Paroxysmal atrial fibrillation: Secondary | ICD-10-CM | POA: Diagnosis present

## 2020-05-20 DIAGNOSIS — I69322 Dysarthria following cerebral infarction: Secondary | ICD-10-CM | POA: Diagnosis not present

## 2020-05-20 DIAGNOSIS — R29705 NIHSS score 5: Secondary | ICD-10-CM | POA: Diagnosis not present

## 2020-05-20 DIAGNOSIS — R933 Abnormal findings on diagnostic imaging of other parts of digestive tract: Secondary | ICD-10-CM | POA: Diagnosis not present

## 2020-05-20 DIAGNOSIS — Q394 Esophageal web: Secondary | ICD-10-CM | POA: Diagnosis not present

## 2020-05-20 DIAGNOSIS — E785 Hyperlipidemia, unspecified: Secondary | ICD-10-CM | POA: Diagnosis present

## 2020-05-20 DIAGNOSIS — E440 Moderate protein-calorie malnutrition: Secondary | ICD-10-CM | POA: Diagnosis not present

## 2020-05-20 DIAGNOSIS — R634 Abnormal weight loss: Secondary | ICD-10-CM | POA: Diagnosis not present

## 2020-05-20 DIAGNOSIS — Z681 Body mass index (BMI) 19 or less, adult: Secondary | ICD-10-CM | POA: Diagnosis not present

## 2020-05-20 DIAGNOSIS — Z79899 Other long term (current) drug therapy: Secondary | ICD-10-CM | POA: Diagnosis not present

## 2020-05-20 DIAGNOSIS — K861 Other chronic pancreatitis: Secondary | ICD-10-CM | POA: Diagnosis not present

## 2020-05-20 DIAGNOSIS — R1310 Dysphagia, unspecified: Secondary | ICD-10-CM | POA: Diagnosis not present

## 2020-05-20 DIAGNOSIS — K31A0 Gastric intestinal metaplasia, unspecified: Secondary | ICD-10-CM | POA: Diagnosis not present

## 2020-05-20 DIAGNOSIS — R4182 Altered mental status, unspecified: Secondary | ICD-10-CM | POA: Diagnosis not present

## 2020-05-20 DIAGNOSIS — N401 Enlarged prostate with lower urinary tract symptoms: Secondary | ICD-10-CM | POA: Diagnosis present

## 2020-05-20 DIAGNOSIS — G8191 Hemiplegia, unspecified affecting right dominant side: Secondary | ICD-10-CM | POA: Diagnosis not present

## 2020-05-20 DIAGNOSIS — R6889 Other general symptoms and signs: Secondary | ICD-10-CM | POA: Diagnosis not present

## 2020-05-20 DIAGNOSIS — Z743 Need for continuous supervision: Secondary | ICD-10-CM | POA: Diagnosis not present

## 2020-05-20 DIAGNOSIS — K224 Dyskinesia of esophagus: Secondary | ICD-10-CM | POA: Diagnosis not present

## 2020-05-20 DIAGNOSIS — K449 Diaphragmatic hernia without obstruction or gangrene: Secondary | ICD-10-CM | POA: Diagnosis not present

## 2020-05-20 DIAGNOSIS — I6622 Occlusion and stenosis of left posterior cerebral artery: Secondary | ICD-10-CM | POA: Diagnosis not present

## 2020-05-20 DIAGNOSIS — R1319 Other dysphagia: Secondary | ICD-10-CM | POA: Diagnosis not present

## 2020-05-20 DIAGNOSIS — R279 Unspecified lack of coordination: Secondary | ICD-10-CM | POA: Diagnosis not present

## 2020-05-20 DIAGNOSIS — G459 Transient cerebral ischemic attack, unspecified: Secondary | ICD-10-CM | POA: Diagnosis not present

## 2020-05-20 DIAGNOSIS — K219 Gastro-esophageal reflux disease without esophagitis: Secondary | ICD-10-CM | POA: Diagnosis present

## 2020-05-20 DIAGNOSIS — D649 Anemia, unspecified: Secondary | ICD-10-CM | POA: Diagnosis not present

## 2020-05-20 DIAGNOSIS — I2510 Atherosclerotic heart disease of native coronary artery without angina pectoris: Secondary | ICD-10-CM | POA: Diagnosis present

## 2020-05-20 DIAGNOSIS — Z87891 Personal history of nicotine dependence: Secondary | ICD-10-CM | POA: Diagnosis not present

## 2020-05-20 DIAGNOSIS — Z7901 Long term (current) use of anticoagulants: Secondary | ICD-10-CM | POA: Diagnosis not present

## 2020-05-20 DIAGNOSIS — K31A19 Gastric intestinal metaplasia without dysplasia, unspecified site: Secondary | ICD-10-CM | POA: Diagnosis not present

## 2020-05-20 DIAGNOSIS — G8929 Other chronic pain: Secondary | ICD-10-CM | POA: Diagnosis present

## 2020-05-20 DIAGNOSIS — R935 Abnormal findings on diagnostic imaging of other abdominal regions, including retroperitoneum: Secondary | ICD-10-CM | POA: Diagnosis not present

## 2020-05-20 DIAGNOSIS — T182XXA Foreign body in stomach, initial encounter: Secondary | ICD-10-CM | POA: Diagnosis not present

## 2020-05-20 DIAGNOSIS — N35819 Other urethral stricture, male, unspecified site: Secondary | ICD-10-CM | POA: Diagnosis present

## 2020-05-20 DIAGNOSIS — Z20822 Contact with and (suspected) exposure to covid-19: Secondary | ICD-10-CM | POA: Diagnosis not present

## 2020-05-20 DIAGNOSIS — K2940 Chronic atrophic gastritis without bleeding: Secondary | ICD-10-CM | POA: Diagnosis not present

## 2020-05-20 DIAGNOSIS — K2950 Unspecified chronic gastritis without bleeding: Secondary | ICD-10-CM | POA: Diagnosis not present

## 2020-05-20 DIAGNOSIS — T18128A Food in esophagus causing other injury, initial encounter: Secondary | ICD-10-CM | POA: Diagnosis not present

## 2020-05-20 DIAGNOSIS — I10 Essential (primary) hypertension: Secondary | ICD-10-CM | POA: Diagnosis present

## 2020-05-20 LAB — BASIC METABOLIC PANEL
Anion gap: 6 (ref 5–15)
BUN: 16 mg/dL (ref 8–23)
CO2: 24 mmol/L (ref 22–32)
Calcium: 8.3 mg/dL — ABNORMAL LOW (ref 8.9–10.3)
Chloride: 104 mmol/L (ref 98–111)
Creatinine, Ser: 1.15 mg/dL (ref 0.61–1.24)
GFR, Estimated: >60 mL/min (ref >60)
Glucose, Bld: 87 mg/dL (ref 70–99)
Potassium: 3.9 mmol/L (ref 3.5–5.1)
Sodium: 134 mmol/L — ABNORMAL LOW (ref 135–145)

## 2020-05-20 LAB — GLUCOSE, CAPILLARY
Glucose-Capillary: 94 mg/dL (ref 70–99)
Glucose-Capillary: 109 mg/dL — ABNORMAL HIGH (ref 70–99)

## 2020-05-20 LAB — CBC
HCT: 27.2 % — ABNORMAL LOW (ref 39.0–52.0)
Hemoglobin: 8.9 g/dL — ABNORMAL LOW (ref 13.0–17.0)
MCH: 29 pg (ref 26.0–34.0)
MCHC: 32.7 g/dL (ref 30.0–36.0)
MCV: 88.6 fL (ref 80.0–100.0)
Platelets: 132 10*3/uL — ABNORMAL LOW (ref 150–400)
RBC: 3.07 MIL/uL — ABNORMAL LOW (ref 4.22–5.81)
RDW: 16.1 % — ABNORMAL HIGH (ref 11.5–15.5)
WBC: 6.3 10*3/uL (ref 4.0–10.5)
nRBC: 0 % (ref 0.0–0.2)

## 2020-05-20 LAB — MRSA PCR SCREENING: MRSA by PCR: NEGATIVE

## 2020-05-20 MED ORDER — THIAMINE HCL 100 MG PO TABS
100.0000 mg | ORAL_TABLET | Freq: Every day | ORAL | Status: DC
  Administered 2020-05-20 – 2020-05-23 (×4): 100 mg via ORAL
  Filled 2020-05-20 (×3): qty 1

## 2020-05-20 NOTE — Progress Notes (Signed)
[facility] Progress Note  Brian Burns 75 y.o. 1945/04/14  CC: Dysphagia, abnormal barium swallow   Subjective: Patient seen and examined at bedside.  Denies any acute GI issues.  ROS : Afebrile, negative for chest pain   Objective: Vital signs in last 24 hours: Vitals:   05/20/20 0400 05/20/20 0802  BP: 114/84 103/77  Pulse: 61 72  Resp: 18 18  Temp: 98.2 F (36.8 C) 97.9 F (36.6 C)  SpO2: 100% 100%    Physical Exam:  General : Resting comfortably, not in acute distress Heart : Irregular rate and rhythm Lungs : No visible respiratory distress Abdomen : Soft, nontender, nondistended, bowel sounds present.  No peritoneal signs Psych : Mood and affect normal  Lab Results: Recent Labs    05/19/20 0439 05/20/20 0319  NA 134* 134*  K 4.1 3.9  CL 102 104  CO2 25 24  GLUCOSE 94 87  BUN 17 16  CREATININE 1.36* 1.15  CALCIUM 8.7* 8.3*   Recent Labs    05/18/20 1630  AST 18  ALT 17  ALKPHOS 81  BILITOT 0.8  PROT 6.1*  ALBUMIN 3.0*   Recent Labs    05/18/20 1630 05/18/20 1640 05/19/20 0439 05/20/20 0319  WBC 7.6  --  5.6 6.3  NEUTROABS 4.5  --   --   --   HGB 10.6*   < > 10.2* 8.9*  HCT 34.3*   < > 31.9* 27.2*  MCV 93.5  --  91.1 88.6  PLT 162  --  164 132*   < > = values in this interval not displayed.   Recent Labs    05/18/20 1630  LABPROT 19.2*  INR 1.6*      Assessment/Plan: -Dysphagia with abnormal barium swallow showing significant dysmotility and questionable retained food in the distal esophagus. -History of CVA. -History of atrial fibrillation.  On anticoagulation -History of coronary artery disease and peripheral vascular disease -Dementia  Recommendations ------------------------- -Barium swallow images reviewed personally.  Do not see any obstructing lesion. -Recommend to hold anticoagulation for 2 days.  Tentative plan for EGD on Monday.  Plan was discussed with patient as well as with admitting  team.  Risks (bleeding, infection, bowel perforation that could require surgery, sedation-related changes in cardiopulmonary systems), benefits (identification and possible treatment of source of symptoms, exclusion of certain causes of symptoms), and alternatives (watchful waiting, radiographic imaging studies, empiric medical treatment)  were explained to patient/family in detail and patient wishes to proceed.   [name] MD, FACP 05/20/2020, 10:11 AM  Contact #  [phone]

## 2020-05-20 NOTE — NC FL2 (Signed)
[place] MEDICAID FL2 LEVEL OF CARE SCREENING TOOL     IDENTIFICATION  Patient Name: Brian Burns Birthdate: February 15, 1945 Sex: male Admission Date (Current Location): 05/18/2020  [facility] and [place] Number:  Producer, television/film/video and Address:  [facility] [place], [address], [place], [place] [address]      Provider Number: [id number]  Attending Physician Name and Address:  Moses Manners, MD  Relative Name and Phone Number:       Current Level of Care: Hospital Recommended Level of Care: Skilled Nursing Facility Prior Approval Number:    Date Approved/Denied:   PASRR Number: [id number]  Discharge Plan: SNF    Current Diagnoses: Patient Active Problem List   Diagnosis Date Noted  . Altered mental status   . Acute right hemiparesis (HCC) 05/18/2020  . Abnormality of gait 09/27/2019  . Seizure (HCC)   . Urge incontinence 12/08/2018  . Stenosis of lateral recess of lumbar spine 12/08/2018  . Dementia, vascular (HCC) 12/08/2018  . Decreased visual acuity of left eye  12/02/2018  . Gross hematuria 10/29/2018  . Other abnormalities of gait and mobility 09/02/2018  . Muscle weakness (generalized) 04/08/2018  . Lumbar spinal stenosis 04/05/2018  . Chronic pain of right knee 03/07/2017  . Chronic low back pain without sciatica 03/07/2017  . Nonischemic cardiomyopathy (HCC) 09/25/2016  . Microalbuminuria 07/26/2015  . Paroxysmal atrial fibrillation (HCC)   . Coronary artery disease involving native coronary artery of native heart without angina pectoris   . Stricture, urethra   . CKD stage 3 due to type 2 diabetes mellitus (HCC) 03/16/2015  . Depression 03/16/2015  . Normocytic anemia   . History of Multiple lacunar infarcts (HCC) 01/31/2015  . Esophageal dysphagia   . Moderate protein-calorie malnutrition (HCC) 01/16/2015  . Unintentional weight loss 03/18/2014  . GERD (gastroesophageal reflux disease) 10/21/2013  . AAA (abdominal aortic  aneurysm) without rupture (HCC) 06/24/2013  . PVD (peripheral vascular disease) (HCC) 06/24/2013  . Orthostatic dizziness 03/18/2013  . Hearing loss of both ears 03/18/2013  . BPH (benign prostatic hyperplasia) 02/16/2013  . Type 2 diabetes mellitus with diabetic neuropathy, unspecified (HCC) 05/10/2012  . Type 2 diabetes mellitus with hypercholesterolemia (HCC) 02/05/2007  . Hypertension associated with diabetes (HCC) 03/27/2006    Orientation RESPIRATION BLADDER Height & Weight     Self  Normal (See DC summary) Incontinent Weight: 160 lb 11.5 oz (72.9 kg) Height:  6\' 4"  (193 cm)  BEHAVIORAL SYMPTOMS/MOOD NEUROLOGICAL BOWEL NUTRITION STATUS      Incontinent Diet (See DC summary)  AMBULATORY STATUS COMMUNICATION OF NEEDS Skin   Extensive Assist Verbally Normal                       Personal Care Assistance Level of Assistance  Bathing,Feeding,Dressing Bathing Assistance: Maximum assistance Feeding assistance: Limited assistance Dressing Assistance: Maximum assistance     Functional Limitations Info  Sight,Hearing,Speech Sight Info: Adequate Hearing Info: Adequate Speech Info: Adequate    SPECIAL CARE FACTORS FREQUENCY  OT (By licensed OT),PT (By licensed PT)     PT Frequency: 5x a week OT Frequency: 5x a week            Contractures Contractures Info: Not present    Additional Factors Info  Code Status,Allergies Code Status Info: partial Allergies Info: gabapentin           Current Medications (05/20/2020):  This is the current hospital active medication list Current Facility-Administered Medications  Medication Dose Route Frequency Provider Last Rate Last Admin  . 0.9 %  sodium chloride infusion   Intravenous Continuous Fayette Pho, MD 125 mL/hr at 05/20/20 1044 New Bag at 05/20/20 1044  . acetaminophen (TYLENOL) tablet 650 mg  650 mg Oral Q6H PRN Littie Deeds, MD       Or  . acetaminophen (TYLENOL) suppository 650 mg  650 mg Rectal Q6H PRN Littie Deeds, MD      . atorvastatin (LIPITOR) tablet 80 mg  80 mg Oral Daily Welborn, Ryan, DO   80 mg at 05/20/20 1045  . Chlorhexidine Gluconate Cloth 2 % PADS 6 each  6 each Topical Daily Moses Manners, MD   6 each at 05/20/20 1046  . citalopram (CELEXA) tablet 40 mg  40 mg Oral Daily Littie Deeds, MD   40 mg at 05/20/20 1045  . DULoxetine (CYMBALTA) DR capsule 30 mg  30 mg Oral QHS Littie Deeds, MD   30 mg at 05/19/20 2213  . famotidine (PEPCID) tablet 20 mg  20 mg Oral Daily PRN Littie Deeds, MD      . finasteride (PROSCAR) tablet 5 mg  5 mg Oral QHS Littie Deeds, MD   5 mg at 05/19/20 2213  . lipase/protease/amylase (CREON) capsule 12,000 Units  12,000 Units Oral TID WC Fayette Pho, MD   12,000 Units at 05/20/20 1122  . polyethylene glycol (MIRALAX / GLYCOLAX) packet 17 g  17 g Oral Daily Littie Deeds, MD   17 g at 05/20/20 1046  . sodium chloride flush (NS) 0.9 % injection 3 mL  3 mL Intravenous Once Littie Deeds, MD      . tamsulosin [facility]) capsule 0.4 mg  0.4 mg Oral Daily Littie Deeds, MD   0.4 mg at 05/20/20 1045  . thiamine tablet 100 mg  100 mg Oral Daily Moses Manners, MD   100 mg at 05/20/20 1112     Discharge Medications: Please see discharge summary for a list of discharge medications.  Relevant Imaging Results:  Relevant Lab Results:   Additional Information SSN [id number]  Jimmy Picket, [place]

## 2020-05-20 NOTE — Progress Notes (Signed)
Pt's foley catheter observed to be leaking, MD on call paged and notified, said to continue to watch, pt cleaned up and made comfortable in bed. Obasogie-Asidi, Addalynne Golding Efe

## 2020-05-20 NOTE — TOC Initial Note (Signed)
Transition of Care [facility]) - Initial/Assessment Note    Patient Details  Name: Brian Burns MRN: [id number] Date of Birth: 09-Jan-1946  [facility] CM/SW Contact:    Jimmy Picket, [place] Phone Number: 05/20/2020, 1:09 PM  Clinical Narrative:                  CSW me with pt at bedside. CSW introduced self and explained her role at the hospital.  Pt daughter and wife are at bedside. Pts daughter states pt has been at [facility] for about a month. They plan on him returning back for SNF. CSW was unable to get [place] on the phone to confirm.   Expected Discharge Plan: Skilled Nursing Facility Barriers to Discharge: Continued Medical Work up   Patient Goals and CMS Choice Patient states their goals for this hospitalization and ongoing recovery are:: finish rehab a SNF CMS Medicare.gov Compare Post Acute Care list provided to:: Patient Choice offered to / list presented to : Adult Children,Spouse  Expected Discharge Plan and Services Expected Discharge Plan: Skilled Nursing Facility       Living arrangements for the past 2 months: Skilled Nursing Facility                                      Prior Living Arrangements/Services Living arrangements for the past 2 months: Skilled Nursing Facility Lives with:: Facility Resident Patient language and need for interpreter reviewed:: Yes Do you feel safe going back to the place where you live?: Yes      Need for Family Participation in Patient Care: Yes (Comment) Care giver support system in place?: Yes (comment)   Criminal Activity/Legal Involvement Pertinent to Current Situation/Hospitalization: No - Comment as needed  Activities of Daily Living Home Assistive Devices/Equipment: Walker (specify type),Wheelchair ADL Screening (condition at time of admission) Patient's cognitive ability adequate to safely complete daily activities?: No Is the patient deaf or have difficulty hearing?: Yes Does the  patient have difficulty seeing, even when wearing glasses/contacts?: No Does the patient have difficulty concentrating, remembering, or making decisions?: Yes Patient able to express need for assistance with ADLs?: Yes Does the patient have difficulty dressing or bathing?: Yes Independently performs ADLs?: No Communication: Needs assistance Is this a change from baseline?: Change from baseline, expected to last <3 days Dressing (OT): Needs assistance Is this a change from baseline?: Pre-admission baseline Grooming: Needs assistance Is this a change from baseline?: Pre-admission baseline Feeding: Needs assistance Is this a change from baseline?: Pre-admission baseline Bathing: Needs assistance Is this a change from baseline?: Pre-admission baseline Toileting: Needs assistance Is this a change from baseline?: Pre-admission baseline In/Out Bed: Needs assistance Is this a change from baseline?: Pre-admission baseline Walks in Home: Dependent Is this a change from baseline?: Pre-admission baseline Does the patient have difficulty walking or climbing stairs?: Yes Weakness of Legs: Both Weakness of Arms/Hands: Right  Permission Sought/Granted Permission sought to share information with : Oceanographer granted to share information with : Yes, Verbal Permission Granted     Permission granted to share info w AGENCY: SNF  Permission granted to share info w Relationship: Wife and daughter     Emotional Assessment Appearance:: Appears stated age Attitude/Demeanor/Rapport: Engaged Affect (typically observed): Appropriate Orientation: : Oriented to Self,Oriented to Place,Oriented to  Time,Oriented to Situation Alcohol / Substance Use: Not Applicable Psych Involvement: No (comment)  Admission  diagnosis:  TIA (transient ischemic attack) [G45.9] Acute right hemiparesis (HCC) [G81.91] Dysphagia [R13.10] Altered mental status, unspecified altered mental status  type [R41.82] Patient Active Problem List   Diagnosis Date Noted  . Altered mental status   . Acute right hemiparesis (HCC) 05/18/2020  . Abnormality of gait 09/27/2019  . Seizure (HCC)   . Urge incontinence 12/08/2018  . Stenosis of lateral recess of lumbar spine 12/08/2018  . Dementia, vascular (HCC) 12/08/2018  . Decreased visual acuity of left eye  12/02/2018  . Gross hematuria 10/29/2018  . Other abnormalities of gait and mobility 09/02/2018  . Muscle weakness (generalized) 04/08/2018  . Lumbar spinal stenosis 04/05/2018  . Chronic pain of right knee 03/07/2017  . Chronic low back pain without sciatica 03/07/2017  . Nonischemic cardiomyopathy (HCC) 09/25/2016  . Microalbuminuria 07/26/2015  . Paroxysmal atrial fibrillation (HCC)   . Coronary artery disease involving native coronary artery of native heart without angina pectoris   . Stricture, urethra   . CKD stage 3 due to type 2 diabetes mellitus (HCC) 03/16/2015  . Depression 03/16/2015  . Normocytic anemia   . History of Multiple lacunar infarcts (HCC) 01/31/2015  . Esophageal dysphagia   . Moderate protein-calorie malnutrition (HCC) 01/16/2015  . Unintentional weight loss 03/18/2014  . GERD (gastroesophageal reflux disease) 10/21/2013  . AAA (abdominal aortic aneurysm) without rupture (HCC) 06/24/2013  . PVD (peripheral vascular disease) (HCC) 06/24/2013  . Orthostatic dizziness 03/18/2013  . Hearing loss of both ears 03/18/2013  . BPH (benign prostatic hyperplasia) 02/16/2013  . Type 2 diabetes mellitus with diabetic neuropathy, unspecified (HCC) 05/10/2012  . Type 2 diabetes mellitus with hypercholesterolemia (HCC) 02/05/2007  . Hypertension associated with diabetes (HCC) 03/27/2006   PCP:  System, Provider Not In Pharmacy:   [facility] [phone] - [place], [place] - [address] AT [facility] [address] [place] [address] Phone: [phone] Fax:  [phone]  [id number] - [place], [place] - [address] AT [facility] OF [address] [place] [address] Phone: [phone] Fax: [phone]     Social Determinants of Health (SDOH) Interventions    Readmission Risk Interventions No flowsheet data found.  Jimmy Picket, Theresia Majors, [place] Clinical Social Worker [phone]

## 2020-05-20 NOTE — Plan of Care (Signed)
  Problem: Education: Goal: Knowledge of disease or condition will improve Outcome: Progressing Goal: Knowledge of secondary prevention will improve Outcome: Progressing Goal: Knowledge of patient specific risk factors addressed and post discharge goals established will improve Outcome: Progressing Goal: Individualized Educational Video(s) Outcome: Progressing   Problem: Coping: Goal: Will verbalize positive feelings about self Outcome: Progressing Goal: Will identify appropriate support needs Outcome: Progressing   Problem: Health Behavior/Discharge Planning: Goal: Ability to manage health-related needs will improve Outcome: Progressing   Problem: Self-Care: Goal: Ability to participate in self-care as condition permits will improve Outcome: Progressing Goal: Verbalization of feelings and concerns over difficulty with self-care will improve Outcome: Progressing Goal: Ability to communicate needs accurately will improve Outcome: Progressing   Problem: Nutrition: Goal: Risk of aspiration will decrease Outcome: Progressing Goal: Dietary intake will improve Outcome: Progressing   Problem: Ischemic Stroke/TIA Tissue Perfusion: Goal: Complications of ischemic stroke/TIA will be minimized Outcome: Progressing   Problem: Health Behavior/Discharge Planning: Goal: Ability to manage health-related needs will improve Outcome: Progressing   Problem: Clinical Measurements: Goal: Ability to maintain clinical measurements within normal limits will improve Outcome: Progressing Goal: Will remain free from infection Outcome: Progressing Goal: Diagnostic test results will improve Outcome: Progressing   Problem: Nutrition: Goal: Adequate nutrition will be maintained Outcome: Progressing   Problem: Elimination: Goal: Will not experience complications related to bowel motility Outcome: Progressing   

## 2020-05-20 NOTE — Progress Notes (Addendum)
Family Medicine Teaching Service Daily Progress Note Intern Pager: [phone]  Patient name: Brian Burns Medical record number: [id number] Date of birth: [birth year] Age: 75 y.o. Gender: male  Primary Care Provider: System, Provider Not In Consultants: Neurology  Code Status: partial (DNI)  Assessment and Plan: Brian Acy Burtonis a 75 y.o.malewho presented with facial droop and emesis found to have left MCA. PMH is significant forCVA(with residual dysarthria), vascular dementia, HTN,T2DM,PAF, CAD, CKD stageIIIa,PVD with claudication, GERD.   Acuteright-sidedhemiparesis Patient has no complaints and reports feeling well this AM.  Patient does have some right-sided facial drooping and dysarthria that he believes is residual from prior strokes.  Per neurology, systolic BP goal 254-270. - hold eliquis in preparation for EGD on 4/25  - Continue thiamine  - continue statin 80 - Neurochecks every 4 - Continuous telemetry - PT/OT recommending SNF (patient admitted from SNF)  - vitals per unit routine  Esophageal Dysmotility Patient noted severe esophageal motility disorder with extensive tertiary contractions and dilated, tortuous esophagus with large amount of retained food and debris in the proximal to mid esophagus.  - GI consulted  - plan for EGD on 4/25  - will hold eliquis for next 48 in preparation for procedure   History of seizure EEG reviewed by neurology, unremarkable, negative for seizure activity. -will CTM   BPH with urethral stricture Continue home tamsulosin and finasteride.  Foley in place.  HTN Normotensive last 24 hours. 3 recorded hypertensive measurements overnight, quickly resolved.  Still holding home lisinopril.  T2DM No home meds.  Glucose today 94.  PAF Continue home Eliquis.  HLD Home atorvastatin 40 mg daily.  Normocytic anemia Hemoglobin today 10.2, stable from admission. - Monitor a.m. CBCs  Chronic pancreatitis Continue  home Creon.  GERD Famotidine daily as needed.  Depression Continue home citalopram.  Chroniclow backpain - Continue home duloxetine 30 mg nightly - Hold home tramadol  Vitamin D deficiency Holding cholecalciferol.   FEN/GI: Heart healthy carb modified  PPx: Eliquis, on hold for upcoming procedure   Status is: Inpatient   The patient will require care spanning > 2 midnights and should be moved to inpatient because: Inpatient level of care appropriate due to severity of illness  Dispo: The patient is from: SNF              Anticipated d/c is to: SNF              Patient currently is medically stable to d/c.   Difficult to place patient No   Expect for patient to return to previous SNF pending insurance authorization.   Subjective:  Patient reports feeling well and has no new complaints.   Objective: Temp:  [97.8 F (36.6 C)-98.4 F (36.9 C)] 98.2 F (36.8 C) (04/23 0400) Pulse Rate:  [54-88] 61 (04/23 0400) Resp:  [13-18] 18 (04/23 0400) BP: (94-137)/(73-100) 114/84 (04/23 0400) SpO2:  [94 %-100 %] 100 % (04/23 0400) Weight:  [72.9 kg] 72.9 kg (04/22 1900)  Physical Exam: General: male appearing stated age, sitting upright in bed in NAD  Cardiovascular: RRR  Respiratory: no increased WOB, stable on RA  Abdomen: no tenderness to palpation, BS present  Extremities: no LE edema   Laboratory: Recent Labs  Lab 05/18/20 1630 05/18/20 1640 05/19/20 0439 05/20/20 0319  WBC 7.6  --  5.6 6.3  HGB 10.6* 10.9* 10.2* 8.9*  HCT 34.3* 32.0* 31.9* 27.2*  PLT 162  --  164 132*   Recent Labs  Lab 05/18/20 1630  05/18/20 1640 05/19/20 0439 05/20/20 0319  NA 135 134* 134* 134*  K 4.1 4.1 4.1 3.9  CL 106 105 102 104  CO2 20*  --  25 24  BUN 17 20 17 16   CREATININE 1.45* 1.40* 1.36* 1.15  CALCIUM 8.7*  --  8.7* 8.3*  PROT 6.1*  --   --   --   BILITOT 0.8  --   --   --   ALKPHOS 81  --   --   --   ALT 17  --   --   --   AST 18  --   --   --   GLUCOSE  129* 126* 94 87    Imaging/Diagnostic Tests: No new imaging   , MD 05/20/2020, 6:29 AM PGY-2, [facility] FPTS Intern pager: [phone], text pages welcome

## 2020-05-20 NOTE — Care Management Obs Status (Signed)
MEDICARE OBSERVATION STATUS NOTIFICATION   Patient Details  Name: Brian Burns MRN: [id number] Date of Birth: 07/16/1945   Medicare Observation Status Notification Given:  Yes    Lawerance Sabal, RN 05/20/2020, 9:16 AM

## 2020-05-21 DIAGNOSIS — E440 Moderate protein-calorie malnutrition: Secondary | ICD-10-CM | POA: Diagnosis not present

## 2020-05-21 DIAGNOSIS — R634 Abnormal weight loss: Secondary | ICD-10-CM | POA: Diagnosis not present

## 2020-05-21 DIAGNOSIS — R1319 Other dysphagia: Secondary | ICD-10-CM | POA: Diagnosis not present

## 2020-05-21 LAB — URINE CULTURE: Culture: 80000 — AB

## 2020-05-21 NOTE — Progress Notes (Signed)
[facility] Progress Note  Brian Burns 75 y.o. 03/27/1945  CC: Dysphagia, abnormal barium swallow   Subjective: Patient seen and examined at bedside.  Denies any acute GI issues.  ROS : Afebrile, negative for chest pain   Objective: Vital signs in last 24 hours: Vitals:   05/21/20 0600 05/21/20 0700  BP: 128/76 133/82  Pulse:    Resp:    Temp: 98.2 F (36.8 C) (!) 97.5 F (36.4 C)  SpO2: 99% 100%    Physical Exam:  General : Resting comfortably, not in acute distress Heart : Irregular rate and rhythm Lungs : No visible respiratory distress Abdomen : Soft, nontender, nondistended, bowel sounds present.  No peritoneal signs Psych : Mood and affect normal  Lab Results: Recent Labs    05/19/20 0439 05/20/20 0319  NA 134* 134*  K 4.1 3.9  CL 102 104  CO2 25 24  GLUCOSE 94 87  BUN 17 16  CREATININE 1.36* 1.15  CALCIUM 8.7* 8.3*   Recent Labs    05/18/20 1630  AST 18  ALT 17  ALKPHOS 81  BILITOT 0.8  PROT 6.1*  ALBUMIN 3.0*   Recent Labs    05/18/20 1630 05/18/20 1640 05/19/20 0439 05/20/20 0319  WBC 7.6  --  5.6 6.3  NEUTROABS 4.5  --   --   --   HGB 10.6*   < > 10.2* 8.9*  HCT 34.3*   < > 31.9* 27.2*  MCV 93.5  --  91.1 88.6  PLT 162  --  164 132*   < > = values in this interval not displayed.   Recent Labs    05/18/20 1630  LABPROT 19.2*  INR 1.6*      Assessment/Plan: -Dysphagia with abnormal barium swallow showing significant dysmotility and questionable retained food in the distal esophagus. -History of CVA. -History of atrial fibrillation.   anticoagulation oh hold -History of coronary artery disease and peripheral vascular disease -Dementia  Recommendations ------------------------- -plan for EGD tomorrow.   -Keep n.p.o. past midnight  Risks (bleeding, infection, bowel perforation that could require surgery, sedation-related changes in cardiopulmonary systems), benefits (identification and possible treatment  of source of symptoms, exclusion of certain causes of symptoms), and alternatives (watchful waiting, radiographic imaging studies, empiric medical treatment)  were explained to patient/family in detail and patient wishes to proceed.   [name] MD, FACP 05/21/2020, 10:09 AM  Contact #  [phone]

## 2020-05-21 NOTE — Plan of Care (Signed)
  Problem: Education: Goal: Knowledge of disease or condition will improve Outcome: Progressing Goal: Knowledge of secondary prevention will improve Outcome: Progressing Goal: Knowledge of patient specific risk factors addressed and post discharge goals established will improve Outcome: Progressing Goal: Individualized Educational Video(s) Outcome: Progressing   Problem: Coping: Goal: Will verbalize positive feelings about self Outcome: Progressing Goal: Will identify appropriate support needs Outcome: Progressing   Problem: Health Behavior/Discharge Planning: Goal: Ability to manage health-related needs will improve Outcome: Progressing   Problem: Self-Care: Goal: Ability to participate in self-care as condition permits will improve Outcome: Progressing Goal: Verbalization of feelings and concerns over difficulty with self-care will improve Outcome: Progressing Goal: Ability to communicate needs accurately will improve Outcome: Progressing   Problem: Nutrition: Goal: Risk of aspiration will decrease Outcome: Progressing Goal: Dietary intake will improve Outcome: Progressing   Problem: Ischemic Stroke/TIA Tissue Perfusion: Goal: Complications of ischemic stroke/TIA will be minimized Outcome: Progressing   Problem: Health Behavior/Discharge Planning: Goal: Ability to manage health-related needs will improve Outcome: Progressing   Problem: Clinical Measurements: Goal: Ability to maintain clinical measurements within normal limits will improve Outcome: Progressing Goal: Will remain free from infection Outcome: Progressing Goal: Diagnostic test results will improve Outcome: Progressing   Problem: Nutrition: Goal: Adequate nutrition will be maintained Outcome: Progressing   Problem: Elimination: Goal: Will not experience complications related to bowel motility Outcome: Progressing   

## 2020-05-21 NOTE — Anesthesia Preprocedure Evaluation (Addendum)
Anesthesia Evaluation  Patient identified by MRN, date of birth, ID band Patient awake    Reviewed: Allergy & Precautions, H&P , NPO status , Patient's Chart, lab work & pertinent test results  Airway Mallampati: I  TM Distance: >3 FB Neck ROM: Full    Dental no notable dental hx. (+) Edentulous Upper, Edentulous Lower, Dental Advisory Given   Pulmonary neg pulmonary ROS, former smoker,    Pulmonary exam normal breath sounds clear to auscultation       Cardiovascular Exercise Tolerance: Good hypertension, Pt. on medications + Peripheral Vascular Disease  + dysrhythmias Atrial Fibrillation  Rhythm:Regular Rate:Normal     Neuro/Psych  Headaches, Anxiety Depression Dementia    GI/Hepatic Neg liver ROS, GERD  Medicated,  Endo/Other  negative endocrine ROS  Renal/GU Renal InsufficiencyRenal disease  negative genitourinary   Musculoskeletal  (+) Arthritis , Osteoarthritis,    Abdominal   Peds  Hematology  (+) Blood dyscrasia, anemia ,   Anesthesia Other Findings   Reproductive/Obstetrics negative OB ROS                            Anesthesia Physical Anesthesia Plan  ASA: III  Anesthesia Plan: MAC   Post-op Pain Management:    Induction: Intravenous  PONV Risk Score and Plan: 1 and Propofol infusion  Airway Management Planned: Nasal Cannula  Additional Equipment:   Intra-op Plan:   Post-operative Plan:   Informed Consent: I have reviewed the patients History and Physical, chart, labs and discussed the procedure including the risks, benefits and alternatives for the proposed anesthesia with the patient or authorized representative who has indicated his/her understanding and acceptance.     Dental advisory given  Plan Discussed with: CRNA  Anesthesia Plan Comments:        Anesthesia Quick Evaluation

## 2020-05-21 NOTE — Progress Notes (Signed)
Family Medicine Teaching Service Daily Progress Note Intern Pager: [phone]  Patient name: Brian Burns Medical record number: [id number] Date of birth: [birth year] Age: 75 y.o. Gender: male  Primary Care Provider: System, Provider Not In Consultants: GI, neurology Code Status: partial (DNI)  Pt Overview and Major Events to Date:  4/21 admitted as CODE STROKE 4/25 expected EGD  Assessment and Plan:  Brian Burns a 75 y.o.malewho presented with facial droop and emesis found to have left MCA. PMH is significant forCVA(with residual dysarthria), vascular dementia, HTN,T2DM,PAF, CAD, CKD stageIIIa,PVD with claudication, GERD.  Acuteright-sidedhemiparesis Doing well this morning, dysarthria and right-sided facial drooping stable from prior.  This is baseline per report secondary to history of multiple strokes. Per neurology, systolic BP goal [phone] due to severe intracranial stenosis. -hold eliquis in preparation for EGD on 4/25  -Continue thiamine  - continue statin 80 -Neurochecks every 4 -Continuous telemetry -PT/OT recommending SNF (patient admitted from SNF)  -vitals per unitroutine  Esophageal Dysmotility - GI consulted, plan for EGD Mon 4/25 - holding Eliquis in preparation  HTN Patient continues to be normotensive despite mIVF with NS @125mL /hr. Holding home lisinopril. Per neurology, systolic BP goal due to severe intracranial stenosis. -Continue normal saline at full maintenance rate -Appreciate neurology recommendations`  BPH with urethral stricture Continue home tamsulosin and finasteride. Foley in place.  Normocytic anemia Hemoglobin yesterday 8.9, down from 10.2 on admission. No AM labs as of yet, patient refused them earlier. Is amenable to drawing now.   T2DM No home meds. Last glucose yesterday afternoon 109. Awaiting AM labs for BMP glucose.   PAF Holding home Eliquis in preparation for EGD Mon 4/25.    HLD Continue from atorvastaton.   Chronic pancreatitis Continue home Creon.   GERD Famotidine daily PRN.   Depression Continue home citalopram.   Chroniclow backpain Continue home duloxetine, hold tramadol.   Vitamin D deficiency Hold home cholecalciferol.   FEN/GI: heart health carb modified PPx: SCDs   Status is: Inpatient  Remains inpatient appropriate because:Ongoing diagnostic testing needed not appropriate for outpatient work up and Inpatient level of care appropriate due to severity of illness   Dispo: The patient is from: SNF              Anticipated d/c is to: SNF              Patient currently is not medically stable to d/c.   Difficult to place patient No   Subjective:  Doing well this morning, dysarthria and right-sided facial drooping stable from prior.  Patient has no complaints, denies any pain.  Requests his socks back on due to feet being cold and orange juice to drink.  Objective: Temp:  [97.5 F (36.4 C)-98.4 F (36.9 C)] 97.5 F (36.4 C) (04/24 0700) Pulse Rate:  [60-82] 60 (04/24 0022) Resp:  [18-20] 18 (04/23 2213) BP: (94-133)/(66-82) 133/82 (04/24 0700) SpO2:  [98 %-100 %] 100 % (04/24 0700) Physical Exam: General: Awake, alert, no acute distress, difficult to understand due to dentition and baseline facial droop secondary to previous strokes Cardiovascular: Regular rate rhythm, no murmur Respiratory: CTA B  Laboratory: Recent Labs  Lab 05/18/20 1630 05/18/20 1640 05/19/20 0439 05/20/20 0319  WBC 7.6  --  5.6 6.3  HGB 10.6* 10.9* 10.2* 8.9*  HCT 34.3* 32.0* 31.9* 27.2*  PLT 162  --  164 132*   Recent Labs  Lab 05/18/20 1630 05/18/20 1640 05/19/20 0439 05/20/20 0319  NA 135 134* 134* 134*  K 4.1 4.1 4.1 3.9  CL 106 105 102 104  CO2 20*  --  25 24  BUN 17 20 17 16   CREATININE 1.45* 1.40* 1.36* 1.15  CALCIUM 8.7*  --  8.7* 8.3*  PROT 6.1*  --   --   --   BILITOT 0.8  --   --   --   ALKPHOS 81  --   --   --    ALT 17  --   --   --   AST 18  --   --   --   GLUCOSE 129* 126* 94 87    Imaging/Diagnostic Tests: None last 24 hours.   , MD 05/21/2020, 8:18 AM PGY-1, [facility] Family Medicine FPTS Intern pager: [phone], text pages welcome

## 2020-05-22 ENCOUNTER — Encounter (HOSPITAL_COMMUNITY)
Admission: EM | Disposition: A | Discharge status: Skilled Nursing Facility | Payer: Self-pay | Source: Skilled Nursing Facility | Attending: Family Medicine

## 2020-05-22 ENCOUNTER — Inpatient Hospital Stay (HOSPITAL_COMMUNITY): Payer: Medicare Other | Admitting: Anesthesiology

## 2020-05-22 ENCOUNTER — Encounter (HOSPITAL_COMMUNITY): Payer: Self-pay | Admitting: Family Medicine

## 2020-05-22 HISTORY — PX: ESOPHAGOGASTRODUODENOSCOPY (EGD) WITH PROPOFOL: SHX5813

## 2020-05-22 HISTORY — PX: BIOPSY: SHX5522

## 2020-05-22 HISTORY — PX: ESOPHAGEAL DILATION: SHX303

## 2020-05-22 HISTORY — PX: FOREIGN BODY REMOVAL: SHX962

## 2020-05-22 LAB — CBC
HCT: 25.2 % — ABNORMAL LOW (ref 39.0–52.0)
Hemoglobin: 8.1 g/dL — ABNORMAL LOW (ref 13.0–17.0)
MCH: 28.5 pg (ref 26.0–34.0)
MCHC: 32.1 g/dL (ref 30.0–36.0)
MCV: 88.7 fL (ref 80.0–100.0)
Platelets: 103 10*3/uL — ABNORMAL LOW (ref 150–400)
RBC: 2.84 MIL/uL — ABNORMAL LOW (ref 4.22–5.81)
RDW: 16.1 % — ABNORMAL HIGH (ref 11.5–15.5)
WBC: 4.1 10*3/uL (ref 4.0–10.5)
nRBC: 0 % (ref 0.0–0.2)

## 2020-05-22 LAB — BASIC METABOLIC PANEL
Anion gap: 4 — ABNORMAL LOW (ref 5–15)
BUN: 9 mg/dL (ref 8–23)
CO2: 23 mmol/L (ref 22–32)
Calcium: 7.9 mg/dL — ABNORMAL LOW (ref 8.9–10.3)
Chloride: 108 mmol/L (ref 98–111)
Creatinine, Ser: 1.1 mg/dL (ref 0.61–1.24)
GFR, Estimated: >60 mL/min (ref >60)
Glucose, Bld: 101 mg/dL — ABNORMAL HIGH (ref 70–99)
Potassium: 3.6 mmol/L (ref 3.5–5.1)
Sodium: 135 mmol/L (ref 135–145)

## 2020-05-22 LAB — GLUCOSE, CAPILLARY
Glucose-Capillary: 108 mg/dL — ABNORMAL HIGH (ref 70–99)
Glucose-Capillary: 146 mg/dL — ABNORMAL HIGH (ref 70–99)
Glucose-Capillary: 115 mg/dL — ABNORMAL HIGH (ref 70–99)

## 2020-05-22 SURGERY — ESOPHAGOGASTRODUODENOSCOPY (EGD) WITH PROPOFOL
Anesthesia: Monitor Anesthesia Care

## 2020-05-22 MED ORDER — EPHEDRINE SULFATE-NACL 50-0.9 MG/10ML-% IV SOSY
PREFILLED_SYRINGE | INTRAVENOUS | Status: DC | PRN
  Administered 2020-05-22: 5 mg via INTRAVENOUS
  Administered 2020-05-22 (×2): 15 mg via INTRAVENOUS
  Administered 2020-05-22: 10 mg via INTRAVENOUS

## 2020-05-22 MED ORDER — LACTATED RINGERS IV SOLN: INTRAVENOUS | Status: DC

## 2020-05-22 MED ORDER — SODIUM CHLORIDE 0.9 % IV SOLN: INTRAVENOUS | Status: DC

## 2020-05-22 MED ORDER — APIXABAN 5 MG PO TABS
5.0000 mg | ORAL_TABLET | Freq: Two times a day (BID) | ORAL | Status: DC
  Administered 2020-05-22 – 2020-05-23 (×4): 5 mg via ORAL
  Filled 2020-05-22 (×4): qty 1

## 2020-05-22 MED ORDER — PHENYLEPHRINE 40 MCG/ML (10ML) SYRINGE FOR IV PUSH (FOR BLOOD PRESSURE SUPPORT)
PREFILLED_SYRINGE | INTRAVENOUS | Status: DC | PRN
  Administered 2020-05-22 (×2): 160 ug via INTRAVENOUS
  Administered 2020-05-22: 80 ug via INTRAVENOUS
  Administered 2020-05-22: 4 ug via INTRAVENOUS
  Administered 2020-05-22: 160 ug via INTRAVENOUS

## 2020-05-22 MED ORDER — LIDOCAINE HCL (CARDIAC) PF 100 MG/5ML IV SOSY
PREFILLED_SYRINGE | INTRAVENOUS | Status: DC | PRN
  Administered 2020-05-22: 40 mg via INTRATRACHEAL

## 2020-05-22 MED ORDER — PROPOFOL 500 MG/50ML IV EMUL
INTRAVENOUS | Status: DC | PRN
  Administered 2020-05-22: 125 ug/kg/min via INTRAVENOUS

## 2020-05-22 MED ORDER — PROPOFOL 10 MG/ML IV BOLUS
INTRAVENOUS | Status: DC | PRN
  Administered 2020-05-22: 20 mg via INTRAVENOUS
  Administered 2020-05-22: 35 mg via INTRAVENOUS

## 2020-05-22 SURGICAL SUPPLY — 14 items

## 2020-05-22 NOTE — Brief Op Note (Signed)
05/18/2020 - 05/22/2020  9:31 AM  PATIENT:  Brian Burns  75 y.o. male  PRE-OPERATIVE DIAGNOSIS:  Dysphagia, abnormal barium swallow  POST-OPERATIVE DIAGNOSIS:  esophageal web, gastroparesis with food in esophagus and stomach, clip in stomach from 2017 procedure  PROCEDURE:  Procedure(s): ESOPHAGOGASTRODUODENOSCOPY (EGD) WITH PROPOFOL (N/A) ESOPHAGEAL DILATION BIOPSY  SURGEON:  Surgeon(s) and Role:    * Litsy Epting, MD - Primary   Findings ------------ -EGD showed retained liquid and food in the esophagus as well as significant esophageal dysmotility.  Food bolus was removed with use of Roth net.  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  Also showed atrophic gastritis and retained clip in the cardia.  Biopsies taken   Recommendations ------------------------- -Recommend full liquid diet for now -With the significant esophageal dysmotility, he is at risk of food retention in the esophagus.  May need to consider feeding tube placement -Okay to resume anticoagulation today -GI will follow  [name] MD, FACP 05/22/2020, 9:34 AM  Contact #  [phone]

## 2020-05-22 NOTE — Anesthesia Procedure Notes (Signed)
Procedure Name: MAC Date/Time: 05/22/2020 8:32 AM Performed by: Leonor Liv, CRNA Pre-anesthesia Checklist: Patient identified, Emergency Drugs available, Suction available, Patient being monitored and Timeout performed Patient Re-evaluated:Patient Re-evaluated prior to induction Oxygen Delivery Method: Nasal cannula Airway Equipment and Method: Bite block Dental Injury: Teeth and Oropharynx as per pre-operative assessment

## 2020-05-22 NOTE — Transfer of Care (Signed)
Immediate Anesthesia Transfer of Care Note  Patient: Brian Burns  Procedure(s) Performed: ESOPHAGOGASTRODUODENOSCOPY (EGD) WITH PROPOFOL (N/A ) ESOPHAGEAL DILATION BIOPSY  Patient Location: Endoscopy Unit  Anesthesia Type:MAC  Level of Consciousness: sedated  Airway & Oxygen Therapy: Patient Spontanous Breathing and Patient connected to nasal cannula oxygen  Post-op Assessment: Report given to RN and Post -op Vital signs reviewed and stable  Post vital signs: Reviewed and stable  Last Vitals:  Vitals Value Taken Time  BP 116/55 05/22/20 0913  Temp    Pulse 77 05/22/20 0915  Resp 22 05/22/20 0915  SpO2 100 % 05/22/20 0915  Vitals shown include unvalidated device data.  Last Pain:  Vitals:   05/22/20 0757  TempSrc: Oral  PainSc: 0-No pain         Complications: No complications documented.

## 2020-05-22 NOTE — Anesthesia Postprocedure Evaluation (Signed)
Anesthesia Post Note  Patient: Brian Burns  Procedure(s) Performed: ESOPHAGOGASTRODUODENOSCOPY (EGD) WITH PROPOFOL (N/A ) ESOPHAGEAL DILATION BIOPSY     Patient location during evaluation: Endoscopy Anesthesia Type: MAC Level of consciousness: awake and alert Pain management: pain level controlled Vital Signs Assessment: post-procedure vital signs reviewed and stable Respiratory status: spontaneous breathing, nonlabored ventilation and respiratory function stable Cardiovascular status: stable and blood pressure returned to baseline Postop Assessment: no apparent nausea or vomiting Anesthetic complications: no   No complications documented.  Last Vitals:  Vitals:   05/22/20 0935 05/22/20 0940  BP: 111/64 (!) 122/56  Pulse: 77 76  Resp: 18 20  Temp:    SpO2: 99% 99%    Last Pain:  Vitals:   05/22/20 0940  TempSrc:   PainSc: 0-No pain                 Lanorris Kalisz,W. EDMOND

## 2020-05-22 NOTE — Progress Notes (Addendum)
FPTS Interim Progress Note  S: Patient comfortable in bed, no complaints. When asked what he's heard about the results of his EGD, he replies, "The said it was all good!" We discussed the food found in his esophagus and the concern for dysmotility. Broached the subject of a possible feeding tube. He doesn't want a feeding tube, saying he "wanted to keep eating everything I've been eating". Asked when he could be discharged. Discussed if he declined feeding tube, he was otherwise medically stable and could go to SNF as soon as insurance approved it, possibly tomorrow per SW.   O: BP 94/60 (BP Location: Right Arm)   Pulse 82   Temp 98.4 F (36.9 C) (Oral)   Resp 20   Ht 6\' 4"  (1.93 m)   Wt 72.9 kg   SpO2 98%   BMI 19.56 kg/m   Gen: awake, alert, NAD Card: RRR no murmur Resp: CTAB  A/P: - no changes to plan of care - patient declines feeding tube at this time - will call daughter with update - possible DC back to SNF when approved by insurance, likely tomorrow per SW  , MD 05/22/2020, 3:54 PM PGY-1, [facility] Family Medicine Service pager [phone]

## 2020-05-22 NOTE — Op Note (Signed)
[facility] Patient Name: Brian Burns Procedure Date : 05/22/2020 MRN: [id number] Attending MD: [name] , MD Date of Birth: 07/03/1945 CSN: [id number] Age: 75 Admit Type: Inpatient Procedure:                Upper GI endoscopy Indications:              Dysphagia, Abnormal barium swallow Providers:                [name], MD, Norman Clay, RN, Beryle Beams,                            Technician, Orvilla Fus, CRNA Referring MD:              Medicines:                Sedation Administered by an Anesthesia Professional Complications:            No immediate complications. Estimated Blood Loss:     Estimated blood loss was minimal. Procedure:                Pre-Anesthesia Assessment:                           - Prior to the procedure, a History and Physical                            was performed, and patient medications and                            allergies were reviewed. The patient's tolerance of                            previous anesthesia was also reviewed. The risks                            and benefits of the procedure and the sedation                            options and risks were discussed with the patient.                            All questions were answered, and informed consent                            was obtained. Prior Anticoagulants: The patient has                            taken Eliquis (apixaban), last dose was 2 days                            prior to procedure. ASA Grade Assessment: III - A                            patient with severe systemic disease. After  reviewing the risks and benefits, the patient was                            deemed in satisfactory condition to undergo the                            procedure.                           After obtaining informed consent, the endoscope was                            passed under direct vision. Throughout the                             procedure, the patient's blood pressure, pulse, and                            oxygen saturations were monitored continuously. The                            GIF-H190 ([id number] Olympus gastroscope was                            introduced through the mouth, and advanced to the                            second part of duodenum. The upper GI endoscopy was                            performed with moderate difficulty due to presence                            of food. The patient tolerated the procedure well. Scope In: Scope Out: Findings:      Food was found in the mid esophagus and in the distal esophagus. Removal       of food was accomplished. Food bolus was removed with use of Roth net.       There are also retained liquid in esophagus which was suctioned.      Abnormal motility was noted in the esophagus. There is a decrease in       motility of the esophageal body.      A web was found in the distal esophagus. A TTS dilator was passed       through the scope. Dilation with a 15-16.5-18 mm balloon dilator was       performed to 15 mm. The dilation site was examined following endoscope       reinsertion and showed no bleeding, mucosal tear or perforation.      A small hiatal hernia was present.      An endoclip was found in the cardia.      Diffuse moderate inflammation was found in the entire examined stomach.       Biopsies were taken with a cold forceps for histology.      The duodenal bulb, first portion of the duodenum and second portion of  the duodenum were normal. Impression:               - Food in the mid esophagus and in the distal                            esophagus. Removal was successful.                           - Abnormal esophageal motility.                           - Web in the distal esophagus. Dilated.                           - Small hiatal hernia.                           - An endoclip was found in the stomach.                           - Atrophic  gastritis. Biopsied.                           - Normal duodenal bulb, first portion of the                            duodenum and second portion of the duodenum. Recommendation:           - Return patient to hospital ward for ongoing care.                           - Clear liquid diet.                           - Continue present medications.                           - Await pathology results.                           - Continue anticoagulant medication at prior dose. Procedure Code(s):        --- Professional ---                           [phone], Esophagogastroduodenoscopy, flexible,                            transoral; with removal of foreign body(s)                           43249, Esophagogastroduodenoscopy, flexible,                            transoral; with transendoscopic balloon dilation of                            esophagus (less than 30 mm diameter)  10315, 59, Esophagogastroduodenoscopy, flexible,                            transoral; with biopsy, single or multiple Diagnosis Code(s):        --- Professional ---                           X45.859Y, Food in esophagus causing other injury,                            initial encounter                           K22.4, Dyskinesia of esophagus                           Q39.4, Esophageal web                           K44.9, Diaphragmatic hernia without obstruction or                            gangrene                           T18.2XXA, Foreign body in stomach, initial encounter                           K29.40, Chronic atrophic gastritis without bleeding                           R13.10, Dysphagia, unspecified CPT copyright 2019 [employer] Association. All rights reserved. The codes documented in this report are preliminary and upon coder review may  be revised to meet current compliance requirements. [name], MD [name], MD 05/22/2020 9:31:07 AM Number of Addenda: 0

## 2020-05-22 NOTE — TOC Progression Note (Addendum)
Transition of Care [facility]) - Progression Note    Patient Details  Name: BRANDELL MAREADY MRN: [id number] Date of Birth: 1945-03-14  [facility] CM/SW Contact  Baldemar Lenis, [place] Phone Number: 05/22/2020, 10:51 AM  Clinical Narrative:   CSW spoke with Sherry Ruffing at [facility] and confirmed that patient was there for rehab, will need a new authorization prior to returning but they can admit patient back when ready. CSW sent a message to PT to ask about seeing the patient to send an update to insurance for authorization, will send in authorization request when PT note is in. CSW to follow.  UPDATE 11:36 AM: CSW faxed insurance authorization in to [facility] Medicare.    Expected Discharge Plan: Skilled Nursing Facility Barriers to Discharge: Continued Medical Work up  Expected Discharge Plan and Services Expected Discharge Plan: Skilled Nursing Facility       Living arrangements for the past 2 months: Skilled Nursing Facility                                       Social Determinants of Health (SDOH) Interventions    Readmission Risk Interventions No flowsheet data found.

## 2020-05-22 NOTE — Progress Notes (Signed)
Pt is off floor for endo procedure. CHG given. IV saline locked.

## 2020-05-22 NOTE — Progress Notes (Signed)
Family Medicine Teaching Service Daily Progress Note Intern Pager: [phone]  Patient name: Brian Burns Medical record number: [id number] Date of birth: 11-13-1945 Age: 75 y.o. Gender: male  Primary Care Provider: System, Provider Not In Consultants: GI, neuro Burns Status: partial (DNI)  Pt Overview and Major Events to Date:  4/21 admitted as Burns STROKE 4/25 EGD  Assessment and Plan: Brian Burns a 75 y.o.malewho presentedwith facial droop and emesis found to have left MCA. PMH is significant forCVA(with residual dysarthria), vascular dementia, HTN,T2DM,PAF, CAD, CKD stageIIIa,PVD with claudication, GERD.  Acuteright-sidedhemiparesis, resolved Did not see Brian Burns this morning, as he was in his EGD. Will follow up this afternoon and place physical exam note. Per neurology, systolic BP goal [id number] due to severe intracranial stenosis. -hold eliquis in preparation for EGD on 4/25 -Continue thiamine  -continuestatin 80 -Continuous telemetry -PT/OT recommending SNF (patient admitted from SNF) -vitals per unitroutine - will not have IVF at [facility].Marland Kitchen. if neuro wants his systolics higher, may consider florinef or similar med   Esophageal Dysmotility - EGD Mon 4/25 - holding Eliquis in preparation, may restart after he is back - GI saw significant esophageal dysmotility, wants to consider feeding tube placement - GI will follow - will start full liquid diet  Asymptomatic bacturia Urine culture collected 4/22 found to grow >80,000 colonies E. Coli, although patient without symptoms.  - no antibiotics at this time - continue to monitor for symptoms  HTN Patient continues to be normotensive despite mIVF with NS @125mL /hr, although BP increasing this morning with systolics in 140s, 150s. Holding home lisinopril. Per neurology, systolic BP goal due to severe intracranial stenosis. -Continue normal saline at full maintenance rate -Appreciate  neurology recommendations  BPH with urethral stricture Continue home tamsulosin and finasteride. Foley in place.  Normocytic anemia Awaiting AM CBC, currently in process. Last Hgb 8.9, down from 10.2 on admission.   T2DM No home meds. CBG this morning 101.   PAF Holding home Eliquis for EGD today, will restart after.   HLD Continue from atorvastatin.   Chronic pancreatitis Continue home Creon.   GERD Famotidine daily PRN.   Depression Continue home citalopram.   Chroniclow backpain Continue home duloxetine, hold tramadol.   Vitamin D deficiency Hold home cholecalciferol.    FEN/GI: NPO for EGD PPx: Holding Eliquis for EGD, will restart after   Status is: Inpatient  Remains inpatient appropriate because:Ongoing diagnostic testing needed not appropriate for outpatient work up   Dispo: The patient is from: SNF              Anticipated d/c is to: SNF              Patient currently is not medically stable to d/c.   Difficult to place patient No   Subjective:  Did not see Brian Burns this morning, as he was in his EGD. Will follow up this afternoon and place physical exam note.   Objective: Temp:  [97.7 F (36.5 C)-99.1 F (37.3 C)] 99.1 F (37.3 C) (04/25 0727) Pulse Rate:  [54-97] 54 (04/25 0727) Resp:  [16-20] 18 (04/25 0727) BP: (104-155)/(68-94) 148/86 (04/25 0727) SpO2:  [98 %-100 %] 100 % (04/25 0727) Physical Exam: Did not see Brian Burns this morning, as he was in his EGD. Will follow up this afternoon and place physical exam note.   Laboratory: Recent Labs  Lab 05/18/20 1630 05/18/20 1640 05/19/20 0439 05/20/20 0319  WBC 7.6  --  5.6 6.3  HGB 10.6*  10.9* 10.2* 8.9*  HCT 34.3* 32.0* 31.9* 27.2*  PLT 162  --  164 132*   Recent Labs  Lab 05/18/20 1630 05/18/20 1640 05/19/20 0439 05/20/20 0319 05/22/20 0550  NA 135   < > 134* 134* 135  K 4.1   < > 4.1 3.9 3.6  CL 106   < > 102 104 108  CO2 20*  --  25 24 23   BUN 17   <  > 17 16 9   CREATININE 1.45*   < > 1.36* 1.15 1.10  CALCIUM 8.7*  --  8.7* 8.3* 7.9*  PROT 6.1*  --   --   --   --   BILITOT 0.8  --   --   --   --   ALKPHOS 81  --   --   --   --   ALT 17  --   --   --   --   AST 18  --   --   --   --   GLUCOSE 129*   < > 94 87 101*   < > = values in this interval not displayed.    Imaging/Diagnostic Tests: None last 24 hours.   , MD 05/22/2020, 7:45 AM PGY-1, [facility] Family Medicine FPTS Intern pager: [phone], text pages welcome

## 2020-05-22 NOTE — Plan of Care (Signed)
  Problem: Education: Goal: Knowledge of disease or condition will improve Outcome: Progressing Goal: Knowledge of secondary prevention will improve Outcome: Progressing Goal: Knowledge of patient specific risk factors addressed and post discharge goals established will improve Outcome: Progressing Goal: Individualized Educational Video(s) Outcome: Progressing   Problem: Coping: Goal: Will verbalize positive feelings about self Outcome: Progressing Goal: Will identify appropriate support needs Outcome: Progressing   Problem: Health Behavior/Discharge Planning: Goal: Ability to manage health-related needs will improve Outcome: Progressing   Problem: Self-Care: Goal: Ability to participate in self-care as condition permits will improve Outcome: Progressing Goal: Verbalization of feelings and concerns over difficulty with self-care will improve Outcome: Progressing Goal: Ability to communicate needs accurately will improve Outcome: Progressing   Problem: Nutrition: Goal: Risk of aspiration will decrease Outcome: Progressing Goal: Dietary intake will improve Outcome: Progressing   Problem: Ischemic Stroke/TIA Tissue Perfusion: Goal: Complications of ischemic stroke/TIA will be minimized Outcome: Progressing   Problem: Health Behavior/Discharge Planning: Goal: Ability to manage health-related needs will improve Outcome: Progressing   Problem: Clinical Measurements: Goal: Ability to maintain clinical measurements within normal limits will improve Outcome: Progressing Goal: Will remain free from infection Outcome: Progressing Goal: Diagnostic test results will improve Outcome: Progressing   Problem: Nutrition: Goal: Adequate nutrition will be maintained Outcome: Progressing   Problem: Elimination: Goal: Will not experience complications related to bowel motility Outcome: Progressing

## 2020-05-22 NOTE — Progress Notes (Signed)
[name] Gastroenterology Progress Note  Brian Burns 75 y.o. [birth year]  CC: Dysphagia, abnormal barium swallow   Subjective: Patient seen and examined at bedside in the endoscopy unit.  Denies any acute GI issues.  ROS : Afebrile, negative for chest pain   Objective: Vital signs in last 24 hours: Vitals:   05/22/20 0727 05/22/20 0757  BP: (!) 148/86 (!) 174/78  Pulse: (!) 54 (!) 55  Resp: 18 18  Temp: 99.1 F (37.3 C) 98.5 F (36.9 C)  SpO2: 100% 98%    Physical Exam:  General : Resting comfortably, not in acute distress Heart : Irregular rate and rhythm Lungs : No visible respiratory distress Abdomen : Soft, nontender, nondistended, bowel sounds present.  No peritoneal signs Psych : Mood and affect normal  Lab Results: Recent Labs    05/20/20 0319 05/22/20 0550  NA 134* 135  K 3.9 3.6  CL 104 108  CO2 24 23  GLUCOSE 87 101*  BUN 16 9  CREATININE 1.15 1.10  CALCIUM 8.3* 7.9*   No results for input(s): AST, ALT, ALKPHOS, BILITOT, PROT, ALBUMIN in the last 72 hours. Recent Labs    05/20/20 0319 05/22/20 0550  WBC 6.3 4.1  HGB 8.9* 8.1*  HCT 27.2* 25.2*  MCV 88.6 88.7  PLT 132* PENDING   No results for input(s): LABPROT, INR in the last 72 hours.    Assessment/Plan: -Dysphagia with abnormal barium swallow showing significant dysmotility and questionable retained food in the distal esophagus. -History of CVA. -History of atrial fibrillation.   anticoagulation oh hold -History of coronary artery disease and peripheral vascular disease -Dementia  Recommendations ------------------------- -Proceed with EGD  Risks (bleeding, infection, bowel perforation that could require surgery, sedation-related changes in cardiopulmonary systems), benefits (identification and possible treatment of source of symptoms, exclusion of certain causes of symptoms), and alternatives (watchful waiting, radiographic imaging studies, empiric medical treatment)  were  explained to patient/family in detail and patient wishes to proceed.   [name] MD, FACP 05/22/2020, 8:31 AM  Contact #  [phone]

## 2020-05-22 NOTE — Progress Notes (Signed)
Physical Therapy Treatment Patient Details Name: Brian Burns MRN: [id number] DOB: 02/01/1945 Today's Date: 05/22/2020    History of Present Illness Pt is a 75 y/o male admitted 4/21 secondary to R facial droop and emesis. MRI negative for acute abnormality. PMH includes CVA (residual dysarthria), vascular dementia, HTN, DM, a fib, CAD, CKD, PVD, and GERD.    PT Comments    Pt making slow progress with mobility. Able to stand for longer today. Pt had EGD prior to session. Continue to recommend return to SNF for further therapy to maximize mobility.    Follow Up Recommendations  SNF     Equipment Recommendations  None recommended by PT    Recommendations for Other Services       Precautions / Restrictions Precautions Precautions: Fall    Mobility  Bed Mobility Overal bed mobility: Needs Assistance Bed Mobility: Sit to Supine;Rolling;Sidelying to Sit Rolling: Min assist Sidelying to sit: Mod assist;HOB elevated   Sit to supine: Mod assist   General bed mobility comments: Assist to bring shoulders over to roll and verbal cues to reach for rail. Assist to elevate trunk into sitting. Assist to bring legs back into bed returning to supine.    Transfers Overall transfer level: Needs assistance Equipment used: Ambulation equipment used Transfers: Sit to/from Stand Sit to Stand: Mod assist;Max assist;From elevated surface         General transfer comment: Used Stedy from elevated bed. On first stand pt required max assist to bring hips up and with posterior lean. On second and third attempt mod assist to rise but unable to fully extend knees, hips, and trunk.  Ambulation/Gait             General Gait Details: Unable to attempt   Stairs             Wheelchair Mobility    Modified Rankin (Stroke Patients Only)       Balance Overall balance assessment: Needs assistance Sitting-balance support: Feet supported;Bilateral upper extremity  supported Sitting balance-Leahy Scale: Poor Sitting balance - Comments: UE support and min assist Postural control: Posterior lean;Left lateral lean Standing balance support: Bilateral upper extremity supported Standing balance-Leahy Scale: Poor Standing balance comment: Stood x 3 on Stedy with mod assist to maintain. Stood 10-30 sec                            Cognition Arousal/Alertness: Awake/alert Behavior During Therapy: WFL for tasks assessed/performed;Flat affect Overall Cognitive Status: No family/caregiver present to determine baseline cognitive functioning                                 General Comments: Dementia at baseline. Follows one step commands      Exercises      General Comments        Pertinent Vitals/Pain Pain Assessment: Faces Faces Pain Scale: No hurt    Home Living                      Prior Function            PT Goals (current goals can now be found in the care plan section) Acute Rehab PT Goals Patient Stated Goal: none stated Progress towards PT goals: Progressing toward goals    Frequency    Min 2X/week      PT Plan Current plan remains appropriate  Co-evaluation              AM-PAC PT "6 Clicks" Mobility   Outcome Measure  Help needed turning from your back to your side while in a flat bed without using bedrails?: A Lot Help needed moving from lying on your back to sitting on the side of a flat bed without using bedrails?: A Lot Help needed moving to and from a bed to a chair (including a wheelchair)?: Total Help needed standing up from a chair using your arms (e.g., wheelchair or bedside chair)?: A Lot Help needed to walk in hospital room?: Total Help needed climbing 3-5 steps with a railing? : Total 6 Click Score: 9    End of Session Equipment Utilized During Treatment: Gait belt Activity Tolerance: Patient tolerated treatment well Patient left: in bed;with call bell/phone  within reach;with bed alarm set (on stretcher in ED)   PT Visit Diagnosis: Unsteadiness on feet (R26.81);Difficulty in walking, not elsewhere classified (R26.2);Muscle weakness (generalized) (M62.81)     Time: 4287-6811 PT Time Calculation (min) (ACUTE ONLY): 26 min  Charges:  $Therapeutic Activity: 23-37 mins                     [facility] PT Acute Rehabilitation Services Pager [phone] Office [phone]    Angelina Ok [facility] 05/22/2020, 11:25 AM

## 2020-05-22 NOTE — Progress Notes (Signed)
Spoke on the phone with patient's daughter [name] to give her an update regarding the EGD. She said she spoke with the GI doctor and was told about the food found in the esophagus. Did not recall being talked to about possibly considering feeding tube. Discussed her with this that it was an option, but that Brian Burns declines at this time. Informed her he was medically stable for discharge and could likely go home to SNF as soon as tomorrow if the insurance authorization is complete. All questions answered.   Fayette Pho, MD

## 2020-05-23 ENCOUNTER — Encounter (HOSPITAL_COMMUNITY): Payer: Self-pay | Admitting: Gastroenterology

## 2020-05-23 LAB — GLUCOSE, CAPILLARY
Glucose-Capillary: 94 mg/dL (ref 70–99)
Glucose-Capillary: 95 mg/dL (ref 70–99)
Glucose-Capillary: 84 mg/dL (ref 70–99)
Glucose-Capillary: 84 mg/dL (ref 70–99)

## 2020-05-23 LAB — CULTURE, BLOOD (ROUTINE X 2): Culture: NO GROWTH

## 2020-05-23 LAB — RESP PANEL BY RT-PCR (FLU A&B, COVID) ARPGX2
Influenza A by PCR: NEGATIVE
Influenza B by PCR: NEGATIVE
SARS Coronavirus 2 by RT PCR: NEGATIVE

## 2020-05-23 LAB — SURGICAL PATHOLOGY

## 2020-05-23 LAB — TROPONIN I (HIGH SENSITIVITY)
Troponin I (High Sensitivity): 13 ng/L (ref <18)
Troponin I (High Sensitivity): 13 ng/L (ref <18)

## 2020-05-23 MED ORDER — ALUM & MAG HYDROXIDE-SIMETH 200-200-20 MG/5ML PO SUSP
30.0000 mL | Freq: Once | ORAL | Status: AC
  Administered 2020-05-23: 30 mL via ORAL
  Filled 2020-05-23: qty 30

## 2020-05-23 MED ORDER — PANTOPRAZOLE SODIUM 40 MG PO TBEC
40.0000 mg | DELAYED_RELEASE_TABLET | Freq: Every day | ORAL | Status: DC
Start: 2020-05-23 — End: 2022-11-05

## 2020-05-23 MED ORDER — POLYETHYLENE GLYCOL 3350 17 G PO PACK: 17.0000 g | PACK | Freq: Every day | ORAL | 0 refills | Status: AC | PRN

## 2020-05-23 MED ORDER — ATORVASTATIN CALCIUM 80 MG PO TABS: 80.0000 mg | ORAL_TABLET | Freq: Every day | ORAL | Status: DC

## 2020-05-23 MED ORDER — ALUM & MAG HYDROXIDE-SIMETH 200-200-20 MG/5ML PO SUSP: 30.0000 mL | Freq: Four times a day (QID) | ORAL | 0 refills | Status: DC | PRN

## 2020-05-23 MED ORDER — LIDOCAINE VISCOUS HCL 2 % MT SOLN
15.0000 mL | Freq: Once | OROMUCOSAL | Status: AC
  Administered 2020-05-23: 15 mL via ORAL
  Filled 2020-05-23: qty 15

## 2020-05-23 MED ORDER — PANTOPRAZOLE SODIUM 40 MG PO TBEC
40.0000 mg | DELAYED_RELEASE_TABLET | Freq: Every day | ORAL | Status: DC
  Administered 2020-05-23: 40 mg via ORAL
  Filled 2020-05-23: qty 1

## 2020-05-23 NOTE — Discharge Instructions (Signed)
Dear Brian Burns,  Thank you for letting us participate in your care. You were hospitalized for stroke-like symptoms and esophageal dysfunction. Your stroke-like symptoms resolved on their own. We did not find any evidence of stroke.   You were seen by GI and received an upper GI scope, which looked at your esophagus and stomach. GI found quite a bit of food in the middle and bottom of your esophagus. This, along with your symptoms, is most likely esophageal dysfunction (dysphagia). We recommend having a mostly liquid diet to prevent aspiration of food into your lungs from your esophagus.   POST-HOSPITAL & CARE INSTRUCTIONS 1. Follow a full liquid diet.  2. Consider a feeding tube in the future to help supplement your eating and make sure you get enough protein and calories each day.  3. We stopped your blood pressure medicine, lisinopril. Your blood pressures in the hospital without this medicine have been normal. You do not need this medicine at this time.  4. We stopped your pepcid and instead started protonix. We believe this will work better for your heartburn. 5. Go to your follow up appointments (listed below)   DOCTOR'S APPOINTMENT   Future Appointments  Date Time Provider Department Center  08/16/2020  3:15 PM Freddie Breech, DPM [facility]    Follow-up Information    [facility]. Schedule an appointment as soon as possible for a visit in 4 week(s).   Specialty: Neurology Contact information: [address] [place] [address] [phone]              Take care and be well!  Family Medicine Teaching Service Inpatient Team [place]  [facility]  [address] [place], [place] [address] [phone]

## 2020-05-23 NOTE — TOC Transition Note (Signed)
Transition of Care [facility]) - CM/SW Discharge Note   Patient Details  Name: Brian Burns MRN: [id number] Date of Birth: 02-Jun-1945  [facility] of [facility]) CM/SW Contact:  Chana Bode, Student-Social Work Phone Number: 05/23/2020, 2:37 PM   Clinical Narrative:   Nurse to call report to [phone]    Final next level of care: Skilled Nursing Facility Barriers to Discharge: No Barriers Identified   Patient Goals and CMS Choice Patient states their goals for this hospitalization and ongoing recovery are:: finish rehab a SNF CMS Medicare.gov Compare Post Acute Care list provided to:: Patient Choice offered to / list presented to : Adult Children,Spouse  Discharge Placement              Patient chooses bed at:  [facility]) Patient to be transferred to facility by: [facility]      Discharge Plan and Services                                     Social Determinants of Health (SDOH) Interventions     Readmission Risk Interventions No flowsheet data found.

## 2020-05-23 NOTE — Plan of Care (Signed)
  Problem: Education: Goal: Knowledge of disease or condition will improve Outcome: Adequate for Discharge Goal: Knowledge of secondary prevention will improve Outcome: Adequate for Discharge Goal: Knowledge of patient specific risk factors addressed and post discharge goals established will improve Outcome: Adequate for Discharge Goal: Individualized Educational Video(s) Outcome: Adequate for Discharge   Problem: Coping: Goal: Will verbalize positive feelings about self Outcome: Adequate for Discharge Goal: Will identify appropriate support needs Outcome: Adequate for Discharge   Problem: Health Behavior/Discharge Planning: Goal: Ability to manage health-related needs will improve Outcome: Adequate for Discharge   Problem: Self-Care: Goal: Ability to participate in self-care as condition permits will improve Outcome: Adequate for Discharge Goal: Verbalization of feelings and concerns over difficulty with self-care will improve Outcome: Adequate for Discharge Goal: Ability to communicate needs accurately will improve Outcome: Adequate for Discharge   Problem: Nutrition: Goal: Risk of aspiration will decrease Outcome: Adequate for Discharge Goal: Dietary intake will improve Outcome: Adequate for Discharge   Problem: Ischemic Stroke/TIA Tissue Perfusion: Goal: Complications of ischemic stroke/TIA will be minimized Outcome: Adequate for Discharge   Problem: Health Behavior/Discharge Planning: Goal: Ability to manage health-related needs will improve Outcome: Adequate for Discharge   Problem: Clinical Measurements: Goal: Ability to maintain clinical measurements within normal limits will improve Outcome: Adequate for Discharge Goal: Will remain free from infection Outcome: Adequate for Discharge Goal: Diagnostic test results will improve Outcome: Adequate for Discharge   Problem: Nutrition: Goal: Adequate nutrition will be maintained Outcome: Adequate for Discharge    Problem: Elimination: Goal: Will not experience complications related to bowel motility Outcome: Adequate for Discharge

## 2020-05-23 NOTE — Progress Notes (Signed)
[facility] Gastroenterology Progress Note  Brian Burns 75 y.o. 1945/05/31  CC: Dysphagia, abnormal barium swallow   Subjective: Patient seen and examined at bedside.  He is not happy with the full liquid diet and wants to have some solid food.  Had some chest pain which is getting better now  ROS : Afebrile, negative for chest pain   Objective: Vital signs in last 24 hours: Vitals:   05/23/20 0800 05/23/20 1200  BP: 127/90 (!) 145/92  Pulse: (!) 59 61  Resp: 18 18  Temp: 97.9 F (36.6 C) 97.8 F (36.6 C)  SpO2:  100%    Physical Exam:  General : Resting comfortably, not in acute distress Heart : Irregular rate and rhythm Lungs : No visible respiratory distress Abdomen : Soft, nontender, nondistended, bowel sounds present.  No peritoneal signs Psych : Mood and affect normal  Lab Results: Recent Labs    05/22/20 0550  NA 135  K 3.6  CL 108  CO2 23  GLUCOSE 101*  BUN 9  CREATININE 1.10  CALCIUM 7.9*   No results for input(s): AST, ALT, ALKPHOS, BILITOT, PROT, ALBUMIN in the last 72 hours. Recent Labs    05/22/20 0550  WBC 4.1  HGB 8.1*  HCT 25.2*  MCV 88.7  PLT 103*   No results for input(s): LABPROT, INR in the last 72 hours.    Assessment/Plan: -Dysphagia with abnormal barium swallow showing significant dysmotility and questionable retained food in the distal esophagus.  -EGD 05/23/2020 showed retained liquid and food in the esophagus as well as significant esophageal dysmotility.  Food bolus was removed with use of Roth net.  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  Also showed atrophic gastritis and retained clip in the cardia.  Biopsies Showed intestinal metaplasia but negative for H. pylori. -History of CVA. -History of atrial fibrillation.    -History of coronary artery disease and peripheral vascular disease -Dementia  Recommendations ------------------------- -Patient is requesting solid food.  I  think it is reasonable to try soft diet. -Recommend to continue soft diet if he does well with it but if he starts having  have issues with swallowing, may need a feeding tube placement.   -Okay to resume anticoagulation from GI -Do not think there is utility of surveillance EGD for intestinal metaplasia in this patient because of multiple comorbidities. -GI will sign off.  Call us back if needed  [name] MD, FACP 05/23/2020, 12:07 PM  Contact #  [phone]

## 2020-05-23 NOTE — Progress Notes (Signed)
Pt called nurse station with chest pain. Reported to nurse a rating of 7/10.  Now reporting a 2/10. CCMD contacted. Reported sinus brady. MD paged. Vitals taken. MD rounding on pt next.   Assessment completed and pt is at baseline. Resting and smiling stated ready to go home.  Dr. Larita Fife in room with pt now.

## 2020-05-23 NOTE — Progress Notes (Signed)
Pt discharged, waiting on [facility] for transport to [facility], transport came and picked up pt at 2220, Daughter called and notified as well, pt reassured. Obasogie-Asidi, Tzipporah Nagorski Efe

## 2020-05-23 NOTE — Discharge Summary (Signed)
Family Medicine Teaching [facility] Discharge Summary  Patient name: Brian Burns Medical record number: [id number] Date of birth: Dec 28, 1945 Age: 75 y.o. Gender: male Date of Admission: 05/18/2020  Date of Discharge: 05/23/2020 Admitting Physician: Littie Deeds, MD  Primary Care Provider: System, Provider Not In Consultants: Neuro, GI  Indication for Hospitalization: stroke like symptoms, dysphagia  Discharge Diagnoses/Problem List:  Acute right sided hemiparesis, resolved Esophageal dysmotility Asymptomatic bacturia Hypertension BPH with urethral stricture Normocytic anemia T2DM PAF HLD Chronic pancreatitis GERD Depression Chronic low back pain Vitamin D deficiency  Disposition: SNF  Discharge Condition: Stable  Discharge Exam:  Blood pressure 127/90, pulse (!) 59, temperature 97.9 F (36.6 C), temperature source Oral, resp. rate 18, height 6\' 4"  (1.93 m), weight 72.9 kg, SpO2 96 %. Gen: awake, alert, NAD Card: RRR, no murmur Resp: CTAB Abd: flat, non-distended, non-tender, BS present  Brief Hospital Course:  Brian Burns is a 75 y.o. male presenting with facial droop and emesis. PMH is significant for CVA (with residual dysarthria), vascular dementia, HTN, T2DM, PAF, CAD, CKD stage IIIa, PVD with claudication, GERD.   Acute right-sided hemiparesis Patient with prior history of CVA with residual dysarthria as well as underlying vascular dementia presenting as a code stroke with sudden onset witnessed right-sided hemiparesis with right-sided facial droop and worsening dysarthria in the setting of vomiting and reported hypotension, now resolved and at baseline per daughter.  Clinically stable at this time with improvement in his neurologic deficits.  CT head without acute abnormalities and CTA head/neck with multiple areas of stenoses but no vessel occlusions.  tPA contraindicated given that he is on anticoagulation. Home lisinopril held due to concern for stroke.  Neuro consulted, recommended EEG given history of discrete seizures. EEG unremarkable for acute epileptic findings. Neuro checks stable through admission, stopped 4/24. Lisinopril continued to be held because patient normotensive. Per neurology, systolic BP goal 5/24 due to severe intracranial stenosis.  Weight loss and dysphagia Per chart review, patient has lost 9 pounds within the past month and 42 pounds over the course of 9 months since last July.  Had a recent ED visit 1 month ago on 3/9, he was noted to have daily postprandial vomiting once a day, but not with every meal.  CTA neck did reveal partially imaged patulous and fluid-filled esophagus, concerning for obstructive cause for his vomiting and weight loss such as esophageal stricture or malignancy.  Patient last had an EGD with [facility] in 2017 when he was hospitalized for a GI bleed, found to have a gastric AVM. EGD demonstrated abnormal esophageal motility (food found in esophagus), distal esophageal web (dilated), small hiatal hernia, endoclip found in stomach, atrophic gastritis (biopsied). GI recommended full liquid diet and consideration of feeding tube. Patient declined feeding tube at this time.   Asymptomatic bacturia Urine culture collected 4/22 found to grow >80,000 colonies E. Coli, although patient without symptoms.No antibiotics initiated. Will recommend outpatient follow up.    Issues for Follow Up:  1. Severe intracranial stenosis. Per neuro, systolics should be 130-150s. Discontinue lisinopril. Normotensive without lisinopril during admission.  2. Continue lipitor 80 mg 3. Patient was reported to be changed to Eliquis from Xarelto and was treated with Eliquis during this admission. Reported switched to Eliquis at the beginning of April. 4. Recommend cbc check for anemia within one week of discharge. 5. Full liquid diet recommended by GI given dysmotility. Approached subject of feeding tube to supplement diet, patient  declines at this time. Follow up  outpatient.  6. Asymptomatic bacturia. No antibiotics initiated given asymptomatic. Follow up for urinary symptoms.   Significant Procedures: EGD 4/25  Significant Labs and Imaging:  Recent Labs  Lab 05/19/20 0439 05/20/20 0319 05/22/20 0550  WBC 5.6 6.3 4.1  HGB 10.2* 8.9* 8.1*  HCT 31.9* 27.2* 25.2*  PLT 164 132* 103*   Recent Labs  Lab 05/18/20 1630 05/18/20 1640 05/19/20 0439 05/20/20 0319 05/22/20 0550  NA 135 134* 134* 134* 135  K 4.1 4.1 4.1 3.9 3.6  CL 106 105 102 104 108  CO2 20*  --  25 24 23   GLUCOSE 129* 126* 94 87 101*  BUN 17 20 17 16 9   CREATININE 1.45* 1.40* 1.36* 1.15 1.10  CALCIUM 8.7*  --  8.7* 8.3* 7.9*  ALKPHOS 81  --   --   --   --   AST 18  --   --   --   --   ALT 17  --   --   --   --   ALBUMIN 3.0*  --   --   --   --     CT HEAD WITHOUT CONTRAST 05/18/20 IMPRESSION: 1. No acute abnormality 2. ASPECTS is 10 3. Moderate atrophy and chronic microvascular ischemic change. No change from the prior study. 4. Extensive intracranial atherosclerotic calcification. 5. Code stroke imaging results were communicated on 05/18/2020 at 4:45 pm to provider Bhagat via text message  CT ANGIOGRAPHY HEAD AND NECK 05/18/20 IMPRESSION: CTA neck: 1. The bilateral common and internal carotid arteries are patent within the neck without hemodynamically significant stenosis. Mild atherosclerotic disease within the carotid systems as described. 2. Vertebral arteries patent within the neck. Moderate atherosclerotic narrowing at the origin of the dominant right vertebral artery. 3. Partially imaged patulous and fluid-filled esophagus.  CTA head: 1. Intracranial atherosclerotic disease with multifocal stenoses, most notably as follows. 2. Severe atherosclerotic disease within the intracranial left vertebral artery with sites of occlusive or near occlusive stenosis. 3. Severe atherosclerotic disease within the intracranial  right vertebral artery with sites of severe stenosis. 4. Multifocal stenoses within the bilateral posterior cerebral arteries. Most notably, there is a severe stenosis within the P3 right PCA. Moderate stenosis within the left PCA at the P1/P2 junction. Severe stenosis within the P4 left PCA. 5. Sites of severe stenosis within a proximal to mid right M2 MCA branch. 6. Sites of moderate to moderately severe stenosis within proximal to mid left M2 MCA branches. 7. Up to moderate stenosis within the cavernous/paraclinoid right ICA.  MRI HEAD WITHOUT CONTRAST 05/18/20 IMPRESSION: 1. No acute intracranial abnormality. 2. Severe atrophy and chronic ischemic microangiopathy.  DG SWALLOW FUNCTION SPEECH PATH 05/19/20 Pt presents with a mild oral dysphagia characterized primarily by oral holding, which has been noted in previous swallowing evaluations at least as far back as 2017. His pharyngeal phase is [facility]. Near the end of the study, the pt began to have retrograde flow of thin liquids back into the pharynx, which he would spontaneously re-swallow. No aspiration was observed. Esophagram was also completed after this study - please see radiologist's note for details. Based on results of this study and that report, pt appears to have a primary esophageal dysphagia. Oropharyngeal swallowing appears to be at his baseline, so SLP will s/o and recommend additional f/u per GI.  ESOPHOGRAM/BARIUM SWALLOW 05/19/20 IMPRESSION: 1. Limited study demonstrating a severe esophageal motility disorder with extensive tertiary contractions. 2. Dilated and tortuous esophagus with large amount of retained food  and debris in the proximal to mid esophagus.  Results/Tests Pending at Time of Discharge: Biopsies from EGD 4/25  Discharge Medications:  Allergies as of 05/23/2020      Reactions   Gabapentin Other (See Comments)   Sedation at 300mg  TID      Medication List    STOP taking these medications    famotidine 20 MG tablet Commonly known as: PEPCID   lisinopril 10 MG tablet Commonly known as: ZESTRIL   lisinopril 5 MG tablet Commonly known as: ZESTRIL     TAKE these medications   acetaminophen 325 MG tablet Commonly known as: TYLENOL Take 325 mg by mouth every 6 (six) hours as needed for moderate pain.   alum & mag hydroxide-simeth 200-200-20 MG/5ML suspension Commonly known as: MAALOX/MYLANTA Take 30 mLs by mouth every 6 (six) hours as needed for indigestion or heartburn.   atorvastatin 80 MG tablet Commonly known as: LIPITOR Take 1 tablet (80 mg total) by mouth daily. Start taking on: May 24, 2020 What changed:   medication strength  See the new instructions.   citalopram 40 MG tablet Commonly known as: CELEXA Take 40 mg by mouth daily.   docusate sodium 100 MG capsule Commonly known as: COLACE Take 100 mg by mouth 2 (two) times daily.   DULoxetine 60 MG capsule Commonly known as: Cymbalta Take 1 capsule (60 mg total) by mouth at bedtime. For back pain/nerve pain What changed: additional instructions   DULoxetine 30 MG capsule Commonly known as: CYMBALTA Take 30 mg by mouth at bedtime. Give with the 60 mg = 90 mg What changed: Another medication with the same name was changed. Make sure you understand how and when to take each.   Eliquis 5 MG Tabs tablet Generic drug: apixaban Take 5 mg by mouth 2 (two) times daily.   ferrous sulfate 324 MG Tbec Take 1 tablet (324 mg total) by mouth daily with breakfast.   finasteride 5 MG tablet Commonly known as: PROSCAR Take 1 tablet (5 mg total) by mouth at bedtime.   lipase/protease/amylase 12000-38000 units Cpep capsule Commonly known as: Creon TAKE 1 CAPSULE BY MOUTH THREE TIMES DAILY WITH MEALS What changed:   how much to take  how to take this  when to take this  additional instructions   multivitamin capsule Take 1 capsule by mouth daily.   pantoprazole 40 MG tablet Commonly known as:  PROTONIX Take 1 tablet (40 mg total) by mouth daily.   polyethylene glycol 17 g packet Commonly known as: MIRALAX / GLYCOLAX Take 17 g by mouth daily as needed.   potassium chloride 10 MEQ tablet Commonly known as: KLOR-CON Take 1 tablet (10 mEq total) by mouth daily.   tamsulosin 0.4 MG Caps capsule Commonly known as: FLOMAX TAKE 1 CAPSULE(0.4 MG) BY MOUTH DAILY What changed:   how much to take  how to take this  when to take this  additional instructions   traMADol 50 MG tablet Commonly known as: Ultram Take 1 tablet (50 mg total) by mouth 3 (three) times daily as needed for moderate pain.   Vitamin D 50 MCG (2000 UT) tablet Take 2,000 Units by mouth daily.       Discharge Instructions: Please refer to Patient Instructions section of EMR for full details.  Patient was counseled important signs and symptoms that should prompt return to medical care, changes in medications, dietary instructions, activity restrictions, and follow up appointments.   Follow-Up Appointments:  Follow-up Information  [facility]. Schedule an appointment as soon as possible for a visit in 4 week(s).   Specialty: Neurology Contact information: [address] [place] [address] [phone]              Fayette Pho, MD 05/23/2020, 11:54 AM PGY-1, [facility]

## 2020-05-23 NOTE — Progress Notes (Signed)
Report called in and received by Morrie Sheldon at [facility]. Pt awaiting PTAR.

## 2020-05-23 NOTE — Progress Notes (Signed)
First troponin negative. Given first troponin value and normal EKG, chest pain most likely due to esophageal dysmotility and EGD yesterday.   - GI cocktail with lidocaine - f/u second troponin - likely DC to SNF this afternoon  Fayette Pho, MD

## 2020-05-23 NOTE — Progress Notes (Addendum)
Ordered a STAT troponin about one hour ago. Has not yet been drawn. Calling [facility] phlebotomy at [phone].   STAT EKG sinus bradycardia with pulse 59, QTc 477. No ST segment elevation. Improved from previous.   ADDENDUM Was able to get a hold of phlebotomy at [phone]. She reports she has already drawn it and it is in the tube system. Apparently the Epic status hasn't updated.   Fayette Pho, MD

## 2020-05-24 DIAGNOSIS — K219 Gastro-esophageal reflux disease without esophagitis: Secondary | ICD-10-CM | POA: Diagnosis not present

## 2020-05-24 DIAGNOSIS — R293 Abnormal posture: Secondary | ICD-10-CM | POA: Diagnosis not present

## 2020-05-24 DIAGNOSIS — E118 Type 2 diabetes mellitus with unspecified complications: Secondary | ICD-10-CM | POA: Diagnosis not present

## 2020-05-24 DIAGNOSIS — R269 Unspecified abnormalities of gait and mobility: Secondary | ICD-10-CM | POA: Diagnosis not present

## 2020-05-24 DIAGNOSIS — N183 Chronic kidney disease, stage 3 unspecified: Secondary | ICD-10-CM | POA: Diagnosis not present

## 2020-05-24 DIAGNOSIS — I739 Peripheral vascular disease, unspecified: Secondary | ICD-10-CM | POA: Diagnosis not present

## 2020-05-24 DIAGNOSIS — E559 Vitamin D deficiency, unspecified: Secondary | ICD-10-CM | POA: Diagnosis not present

## 2020-05-24 DIAGNOSIS — I480 Paroxysmal atrial fibrillation: Secondary | ICD-10-CM | POA: Diagnosis not present

## 2020-05-24 DIAGNOSIS — R1312 Dysphagia, oropharyngeal phase: Secondary | ICD-10-CM | POA: Diagnosis not present

## 2020-05-24 DIAGNOSIS — I69398 Other sequelae of cerebral infarction: Secondary | ICD-10-CM | POA: Diagnosis not present

## 2020-05-24 DIAGNOSIS — I10 Essential (primary) hypertension: Secondary | ICD-10-CM | POA: Diagnosis not present

## 2020-05-24 DIAGNOSIS — R1319 Other dysphagia: Secondary | ICD-10-CM | POA: Diagnosis not present

## 2020-05-24 DIAGNOSIS — N1831 Chronic kidney disease, stage 3a: Secondary | ICD-10-CM | POA: Diagnosis not present

## 2020-05-24 DIAGNOSIS — K5900 Constipation, unspecified: Secondary | ICD-10-CM | POA: Diagnosis not present

## 2020-05-24 DIAGNOSIS — R5381 Other malaise: Secondary | ICD-10-CM | POA: Diagnosis not present

## 2020-05-24 DIAGNOSIS — E785 Hyperlipidemia, unspecified: Secondary | ICD-10-CM | POA: Diagnosis not present

## 2020-05-24 DIAGNOSIS — D631 Anemia in chronic kidney disease: Secondary | ICD-10-CM | POA: Diagnosis not present

## 2020-05-24 DIAGNOSIS — R2681 Unsteadiness on feet: Secondary | ICD-10-CM | POA: Diagnosis not present

## 2020-05-24 DIAGNOSIS — R569 Unspecified convulsions: Secondary | ICD-10-CM | POA: Diagnosis not present

## 2020-05-24 DIAGNOSIS — R051 Acute cough: Secondary | ICD-10-CM | POA: Diagnosis not present

## 2020-05-24 DIAGNOSIS — H918X3 Other specified hearing loss, bilateral: Secondary | ICD-10-CM | POA: Diagnosis not present

## 2020-05-24 DIAGNOSIS — I2510 Atherosclerotic heart disease of native coronary artery without angina pectoris: Secondary | ICD-10-CM | POA: Diagnosis not present

## 2020-05-24 DIAGNOSIS — M6281 Muscle weakness (generalized): Secondary | ICD-10-CM | POA: Diagnosis not present

## 2020-05-24 DIAGNOSIS — U071 COVID-19: Secondary | ICD-10-CM | POA: Diagnosis not present

## 2020-05-24 LAB — CULTURE, BLOOD (ROUTINE X 2): Culture: NO GROWTH

## 2020-05-28 DIAGNOSIS — N183 Chronic kidney disease, stage 3 unspecified: Secondary | ICD-10-CM | POA: Diagnosis not present

## 2020-05-28 DIAGNOSIS — I69398 Other sequelae of cerebral infarction: Secondary | ICD-10-CM | POA: Diagnosis not present

## 2020-05-28 DIAGNOSIS — R269 Unspecified abnormalities of gait and mobility: Secondary | ICD-10-CM | POA: Diagnosis not present

## 2020-05-28 DIAGNOSIS — R569 Unspecified convulsions: Secondary | ICD-10-CM | POA: Diagnosis not present

## 2020-05-28 DIAGNOSIS — I2510 Atherosclerotic heart disease of native coronary artery without angina pectoris: Secondary | ICD-10-CM | POA: Diagnosis not present

## 2020-05-30 ENCOUNTER — Ambulatory Visit: Payer: Medicare Other | Admitting: Podiatry

## 2020-06-03 LAB — VITAMIN B1: Vitamin B1 (Thiamine): 75.9 nmol/L (ref 66.5–200.0)

## 2020-06-06 DIAGNOSIS — U071 COVID-19: Secondary | ICD-10-CM | POA: Diagnosis not present

## 2020-06-12 DIAGNOSIS — I10 Essential (primary) hypertension: Secondary | ICD-10-CM | POA: Diagnosis not present

## 2020-06-14 DIAGNOSIS — U071 COVID-19: Secondary | ICD-10-CM | POA: Diagnosis not present

## 2020-06-15 DIAGNOSIS — R051 Acute cough: Secondary | ICD-10-CM | POA: Diagnosis not present

## 2020-06-16 DIAGNOSIS — I2510 Atherosclerotic heart disease of native coronary artery without angina pectoris: Secondary | ICD-10-CM | POA: Diagnosis not present

## 2020-06-16 DIAGNOSIS — M6281 Muscle weakness (generalized): Secondary | ICD-10-CM | POA: Diagnosis not present

## 2020-06-16 DIAGNOSIS — I69351 Hemiplegia and hemiparesis following cerebral infarction affecting right dominant side: Secondary | ICD-10-CM | POA: Diagnosis not present

## 2020-06-16 DIAGNOSIS — R293 Abnormal posture: Secondary | ICD-10-CM | POA: Diagnosis not present

## 2020-06-16 DIAGNOSIS — R2689 Other abnormalities of gait and mobility: Secondary | ICD-10-CM | POA: Diagnosis not present

## 2020-06-16 DIAGNOSIS — E118 Type 2 diabetes mellitus with unspecified complications: Secondary | ICD-10-CM | POA: Diagnosis not present

## 2020-06-16 DIAGNOSIS — R2681 Unsteadiness on feet: Secondary | ICD-10-CM | POA: Diagnosis not present

## 2020-06-16 DIAGNOSIS — N1831 Chronic kidney disease, stage 3a: Secondary | ICD-10-CM | POA: Diagnosis not present

## 2020-06-16 DIAGNOSIS — R1312 Dysphagia, oropharyngeal phase: Secondary | ICD-10-CM | POA: Diagnosis not present

## 2020-06-17 DIAGNOSIS — E1159 Type 2 diabetes mellitus with other circulatory complications: Secondary | ICD-10-CM | POA: Diagnosis not present

## 2020-06-17 DIAGNOSIS — M6281 Muscle weakness (generalized): Secondary | ICD-10-CM | POA: Diagnosis not present

## 2020-06-17 DIAGNOSIS — I10 Essential (primary) hypertension: Secondary | ICD-10-CM | POA: Diagnosis not present

## 2020-06-17 DIAGNOSIS — E785 Hyperlipidemia, unspecified: Secondary | ICD-10-CM | POA: Diagnosis not present

## 2020-06-17 DIAGNOSIS — R2681 Unsteadiness on feet: Secondary | ICD-10-CM | POA: Diagnosis not present

## 2020-06-17 DIAGNOSIS — E559 Vitamin D deficiency, unspecified: Secondary | ICD-10-CM | POA: Diagnosis not present

## 2020-06-17 DIAGNOSIS — N1831 Chronic kidney disease, stage 3a: Secondary | ICD-10-CM | POA: Diagnosis not present

## 2020-06-17 DIAGNOSIS — I69351 Hemiplegia and hemiparesis following cerebral infarction affecting right dominant side: Secondary | ICD-10-CM | POA: Diagnosis not present

## 2020-06-17 DIAGNOSIS — I480 Paroxysmal atrial fibrillation: Secondary | ICD-10-CM | POA: Diagnosis not present

## 2020-06-17 DIAGNOSIS — R1312 Dysphagia, oropharyngeal phase: Secondary | ICD-10-CM | POA: Diagnosis not present

## 2020-06-17 DIAGNOSIS — I2510 Atherosclerotic heart disease of native coronary artery without angina pectoris: Secondary | ICD-10-CM | POA: Diagnosis not present

## 2020-06-17 DIAGNOSIS — E1151 Type 2 diabetes mellitus with diabetic peripheral angiopathy without gangrene: Secondary | ICD-10-CM | POA: Diagnosis not present

## 2020-06-17 DIAGNOSIS — R2689 Other abnormalities of gait and mobility: Secondary | ICD-10-CM | POA: Diagnosis not present

## 2020-06-17 DIAGNOSIS — E118 Type 2 diabetes mellitus with unspecified complications: Secondary | ICD-10-CM | POA: Diagnosis not present

## 2020-06-17 DIAGNOSIS — N183 Chronic kidney disease, stage 3 unspecified: Secondary | ICD-10-CM | POA: Diagnosis not present

## 2020-06-17 DIAGNOSIS — R293 Abnormal posture: Secondary | ICD-10-CM | POA: Diagnosis not present

## 2020-06-17 DIAGNOSIS — D649 Anemia, unspecified: Secondary | ICD-10-CM | POA: Diagnosis not present

## 2020-06-19 DIAGNOSIS — R2689 Other abnormalities of gait and mobility: Secondary | ICD-10-CM | POA: Diagnosis not present

## 2020-06-19 DIAGNOSIS — I2510 Atherosclerotic heart disease of native coronary artery without angina pectoris: Secondary | ICD-10-CM | POA: Diagnosis not present

## 2020-06-19 DIAGNOSIS — I69351 Hemiplegia and hemiparesis following cerebral infarction affecting right dominant side: Secondary | ICD-10-CM | POA: Diagnosis not present

## 2020-06-19 DIAGNOSIS — M6281 Muscle weakness (generalized): Secondary | ICD-10-CM | POA: Diagnosis not present

## 2020-06-19 DIAGNOSIS — E118 Type 2 diabetes mellitus with unspecified complications: Secondary | ICD-10-CM | POA: Diagnosis not present

## 2020-06-19 DIAGNOSIS — R293 Abnormal posture: Secondary | ICD-10-CM | POA: Diagnosis not present

## 2020-06-19 DIAGNOSIS — R2681 Unsteadiness on feet: Secondary | ICD-10-CM | POA: Diagnosis not present

## 2020-06-19 DIAGNOSIS — R1312 Dysphagia, oropharyngeal phase: Secondary | ICD-10-CM | POA: Diagnosis not present

## 2020-06-19 DIAGNOSIS — N1831 Chronic kidney disease, stage 3a: Secondary | ICD-10-CM | POA: Diagnosis not present

## 2020-06-20 DIAGNOSIS — R2689 Other abnormalities of gait and mobility: Secondary | ICD-10-CM | POA: Diagnosis not present

## 2020-06-20 DIAGNOSIS — R1312 Dysphagia, oropharyngeal phase: Secondary | ICD-10-CM | POA: Diagnosis not present

## 2020-06-20 DIAGNOSIS — I69351 Hemiplegia and hemiparesis following cerebral infarction affecting right dominant side: Secondary | ICD-10-CM | POA: Diagnosis not present

## 2020-06-20 DIAGNOSIS — E118 Type 2 diabetes mellitus with unspecified complications: Secondary | ICD-10-CM | POA: Diagnosis not present

## 2020-06-20 DIAGNOSIS — I2510 Atherosclerotic heart disease of native coronary artery without angina pectoris: Secondary | ICD-10-CM | POA: Diagnosis not present

## 2020-06-20 DIAGNOSIS — R2681 Unsteadiness on feet: Secondary | ICD-10-CM | POA: Diagnosis not present

## 2020-06-20 DIAGNOSIS — N1831 Chronic kidney disease, stage 3a: Secondary | ICD-10-CM | POA: Diagnosis not present

## 2020-06-20 DIAGNOSIS — M6281 Muscle weakness (generalized): Secondary | ICD-10-CM | POA: Diagnosis not present

## 2020-06-20 DIAGNOSIS — R293 Abnormal posture: Secondary | ICD-10-CM | POA: Diagnosis not present

## 2020-06-21 DIAGNOSIS — I69351 Hemiplegia and hemiparesis following cerebral infarction affecting right dominant side: Secondary | ICD-10-CM | POA: Diagnosis not present

## 2020-06-21 DIAGNOSIS — N1831 Chronic kidney disease, stage 3a: Secondary | ICD-10-CM | POA: Diagnosis not present

## 2020-06-21 DIAGNOSIS — E118 Type 2 diabetes mellitus with unspecified complications: Secondary | ICD-10-CM | POA: Diagnosis not present

## 2020-06-21 DIAGNOSIS — M6281 Muscle weakness (generalized): Secondary | ICD-10-CM | POA: Diagnosis not present

## 2020-06-21 DIAGNOSIS — E1151 Type 2 diabetes mellitus with diabetic peripheral angiopathy without gangrene: Secondary | ICD-10-CM | POA: Diagnosis not present

## 2020-06-21 DIAGNOSIS — E785 Hyperlipidemia, unspecified: Secondary | ICD-10-CM | POA: Diagnosis not present

## 2020-06-21 DIAGNOSIS — I10 Essential (primary) hypertension: Secondary | ICD-10-CM | POA: Diagnosis not present

## 2020-06-21 DIAGNOSIS — R2681 Unsteadiness on feet: Secondary | ICD-10-CM | POA: Diagnosis not present

## 2020-06-21 DIAGNOSIS — I480 Paroxysmal atrial fibrillation: Secondary | ICD-10-CM | POA: Diagnosis not present

## 2020-06-21 DIAGNOSIS — R1319 Other dysphagia: Secondary | ICD-10-CM | POA: Diagnosis not present

## 2020-06-21 DIAGNOSIS — N183 Chronic kidney disease, stage 3 unspecified: Secondary | ICD-10-CM | POA: Diagnosis not present

## 2020-06-21 DIAGNOSIS — K219 Gastro-esophageal reflux disease without esophagitis: Secondary | ICD-10-CM | POA: Diagnosis not present

## 2020-06-21 DIAGNOSIS — R293 Abnormal posture: Secondary | ICD-10-CM | POA: Diagnosis not present

## 2020-06-21 DIAGNOSIS — F32A Depression, unspecified: Secondary | ICD-10-CM | POA: Diagnosis not present

## 2020-06-21 DIAGNOSIS — R1312 Dysphagia, oropharyngeal phase: Secondary | ICD-10-CM | POA: Diagnosis not present

## 2020-06-21 DIAGNOSIS — I2510 Atherosclerotic heart disease of native coronary artery without angina pectoris: Secondary | ICD-10-CM | POA: Diagnosis not present

## 2020-06-21 DIAGNOSIS — R5381 Other malaise: Secondary | ICD-10-CM | POA: Diagnosis not present

## 2020-06-21 DIAGNOSIS — R2689 Other abnormalities of gait and mobility: Secondary | ICD-10-CM | POA: Diagnosis not present

## 2020-06-22 DIAGNOSIS — E118 Type 2 diabetes mellitus with unspecified complications: Secondary | ICD-10-CM | POA: Diagnosis not present

## 2020-06-22 DIAGNOSIS — R1312 Dysphagia, oropharyngeal phase: Secondary | ICD-10-CM | POA: Diagnosis not present

## 2020-06-22 DIAGNOSIS — R2689 Other abnormalities of gait and mobility: Secondary | ICD-10-CM | POA: Diagnosis not present

## 2020-06-22 DIAGNOSIS — R2681 Unsteadiness on feet: Secondary | ICD-10-CM | POA: Diagnosis not present

## 2020-06-22 DIAGNOSIS — M6281 Muscle weakness (generalized): Secondary | ICD-10-CM | POA: Diagnosis not present

## 2020-06-22 DIAGNOSIS — I2510 Atherosclerotic heart disease of native coronary artery without angina pectoris: Secondary | ICD-10-CM | POA: Diagnosis not present

## 2020-06-22 DIAGNOSIS — R293 Abnormal posture: Secondary | ICD-10-CM | POA: Diagnosis not present

## 2020-06-22 DIAGNOSIS — N1831 Chronic kidney disease, stage 3a: Secondary | ICD-10-CM | POA: Diagnosis not present

## 2020-06-22 DIAGNOSIS — I69351 Hemiplegia and hemiparesis following cerebral infarction affecting right dominant side: Secondary | ICD-10-CM | POA: Diagnosis not present

## 2020-06-23 DIAGNOSIS — R2681 Unsteadiness on feet: Secondary | ICD-10-CM | POA: Diagnosis not present

## 2020-06-23 DIAGNOSIS — R2689 Other abnormalities of gait and mobility: Secondary | ICD-10-CM | POA: Diagnosis not present

## 2020-06-23 DIAGNOSIS — N1831 Chronic kidney disease, stage 3a: Secondary | ICD-10-CM | POA: Diagnosis not present

## 2020-06-23 DIAGNOSIS — I69351 Hemiplegia and hemiparesis following cerebral infarction affecting right dominant side: Secondary | ICD-10-CM | POA: Diagnosis not present

## 2020-06-23 DIAGNOSIS — E118 Type 2 diabetes mellitus with unspecified complications: Secondary | ICD-10-CM | POA: Diagnosis not present

## 2020-06-23 DIAGNOSIS — I2510 Atherosclerotic heart disease of native coronary artery without angina pectoris: Secondary | ICD-10-CM | POA: Diagnosis not present

## 2020-06-23 DIAGNOSIS — R293 Abnormal posture: Secondary | ICD-10-CM | POA: Diagnosis not present

## 2020-06-23 DIAGNOSIS — R1312 Dysphagia, oropharyngeal phase: Secondary | ICD-10-CM | POA: Diagnosis not present

## 2020-06-23 DIAGNOSIS — M6281 Muscle weakness (generalized): Secondary | ICD-10-CM | POA: Diagnosis not present

## 2020-06-26 DIAGNOSIS — N1831 Chronic kidney disease, stage 3a: Secondary | ICD-10-CM | POA: Diagnosis not present

## 2020-06-26 DIAGNOSIS — R1312 Dysphagia, oropharyngeal phase: Secondary | ICD-10-CM | POA: Diagnosis not present

## 2020-06-26 DIAGNOSIS — R2681 Unsteadiness on feet: Secondary | ICD-10-CM | POA: Diagnosis not present

## 2020-06-26 DIAGNOSIS — M6281 Muscle weakness (generalized): Secondary | ICD-10-CM | POA: Diagnosis not present

## 2020-06-26 DIAGNOSIS — R2689 Other abnormalities of gait and mobility: Secondary | ICD-10-CM | POA: Diagnosis not present

## 2020-06-26 DIAGNOSIS — E118 Type 2 diabetes mellitus with unspecified complications: Secondary | ICD-10-CM | POA: Diagnosis not present

## 2020-06-26 DIAGNOSIS — I69351 Hemiplegia and hemiparesis following cerebral infarction affecting right dominant side: Secondary | ICD-10-CM | POA: Diagnosis not present

## 2020-06-26 DIAGNOSIS — R293 Abnormal posture: Secondary | ICD-10-CM | POA: Diagnosis not present

## 2020-06-26 DIAGNOSIS — I2510 Atherosclerotic heart disease of native coronary artery without angina pectoris: Secondary | ICD-10-CM | POA: Diagnosis not present

## 2020-06-27 DIAGNOSIS — N1831 Chronic kidney disease, stage 3a: Secondary | ICD-10-CM | POA: Diagnosis not present

## 2020-06-27 DIAGNOSIS — R1312 Dysphagia, oropharyngeal phase: Secondary | ICD-10-CM | POA: Diagnosis not present

## 2020-06-27 DIAGNOSIS — I69351 Hemiplegia and hemiparesis following cerebral infarction affecting right dominant side: Secondary | ICD-10-CM | POA: Diagnosis not present

## 2020-06-27 DIAGNOSIS — R2689 Other abnormalities of gait and mobility: Secondary | ICD-10-CM | POA: Diagnosis not present

## 2020-06-27 DIAGNOSIS — R293 Abnormal posture: Secondary | ICD-10-CM | POA: Diagnosis not present

## 2020-06-27 DIAGNOSIS — R2681 Unsteadiness on feet: Secondary | ICD-10-CM | POA: Diagnosis not present

## 2020-06-27 DIAGNOSIS — E118 Type 2 diabetes mellitus with unspecified complications: Secondary | ICD-10-CM | POA: Diagnosis not present

## 2020-06-27 DIAGNOSIS — I2510 Atherosclerotic heart disease of native coronary artery without angina pectoris: Secondary | ICD-10-CM | POA: Diagnosis not present

## 2020-06-27 DIAGNOSIS — M6281 Muscle weakness (generalized): Secondary | ICD-10-CM | POA: Diagnosis not present

## 2020-06-28 ENCOUNTER — Inpatient Hospital Stay: Payer: Medicare Other | Admitting: Adult Health

## 2020-06-28 DIAGNOSIS — M6281 Muscle weakness (generalized): Secondary | ICD-10-CM | POA: Diagnosis not present

## 2020-06-28 DIAGNOSIS — R2689 Other abnormalities of gait and mobility: Secondary | ICD-10-CM | POA: Diagnosis not present

## 2020-06-28 DIAGNOSIS — E118 Type 2 diabetes mellitus with unspecified complications: Secondary | ICD-10-CM | POA: Diagnosis not present

## 2020-06-28 DIAGNOSIS — I2510 Atherosclerotic heart disease of native coronary artery without angina pectoris: Secondary | ICD-10-CM | POA: Diagnosis not present

## 2020-06-28 DIAGNOSIS — I69351 Hemiplegia and hemiparesis following cerebral infarction affecting right dominant side: Secondary | ICD-10-CM | POA: Diagnosis not present

## 2020-06-28 DIAGNOSIS — N1831 Chronic kidney disease, stage 3a: Secondary | ICD-10-CM | POA: Diagnosis not present

## 2020-06-28 DIAGNOSIS — R2681 Unsteadiness on feet: Secondary | ICD-10-CM | POA: Diagnosis not present

## 2020-06-28 DIAGNOSIS — R293 Abnormal posture: Secondary | ICD-10-CM | POA: Diagnosis not present

## 2020-06-28 DIAGNOSIS — R1312 Dysphagia, oropharyngeal phase: Secondary | ICD-10-CM | POA: Diagnosis not present

## 2020-06-29 DIAGNOSIS — N183 Chronic kidney disease, stage 3 unspecified: Secondary | ICD-10-CM | POA: Diagnosis not present

## 2020-06-29 DIAGNOSIS — I2510 Atherosclerotic heart disease of native coronary artery without angina pectoris: Secondary | ICD-10-CM | POA: Diagnosis not present

## 2020-06-29 DIAGNOSIS — I10 Essential (primary) hypertension: Secondary | ICD-10-CM | POA: Diagnosis not present

## 2020-06-29 DIAGNOSIS — I480 Paroxysmal atrial fibrillation: Secondary | ICD-10-CM | POA: Diagnosis not present

## 2020-06-29 DIAGNOSIS — R1319 Other dysphagia: Secondary | ICD-10-CM | POA: Diagnosis not present

## 2020-06-29 DIAGNOSIS — R5381 Other malaise: Secondary | ICD-10-CM | POA: Diagnosis not present

## 2020-06-29 DIAGNOSIS — E1151 Type 2 diabetes mellitus with diabetic peripheral angiopathy without gangrene: Secondary | ICD-10-CM | POA: Diagnosis not present

## 2020-06-29 DIAGNOSIS — K219 Gastro-esophageal reflux disease without esophagitis: Secondary | ICD-10-CM | POA: Diagnosis not present

## 2020-07-07 DIAGNOSIS — I2510 Atherosclerotic heart disease of native coronary artery without angina pectoris: Secondary | ICD-10-CM | POA: Diagnosis not present

## 2020-07-07 DIAGNOSIS — N183 Chronic kidney disease, stage 3 unspecified: Secondary | ICD-10-CM | POA: Diagnosis not present

## 2020-07-07 DIAGNOSIS — R269 Unspecified abnormalities of gait and mobility: Secondary | ICD-10-CM | POA: Diagnosis not present

## 2020-07-07 DIAGNOSIS — R569 Unspecified convulsions: Secondary | ICD-10-CM | POA: Diagnosis not present

## 2020-07-07 DIAGNOSIS — I69398 Other sequelae of cerebral infarction: Secondary | ICD-10-CM | POA: Diagnosis not present

## 2020-07-20 DIAGNOSIS — I2510 Atherosclerotic heart disease of native coronary artery without angina pectoris: Secondary | ICD-10-CM | POA: Diagnosis not present

## 2020-07-20 DIAGNOSIS — R5381 Other malaise: Secondary | ICD-10-CM | POA: Diagnosis not present

## 2020-07-20 DIAGNOSIS — K5900 Constipation, unspecified: Secondary | ICD-10-CM | POA: Diagnosis not present

## 2020-07-20 DIAGNOSIS — E1151 Type 2 diabetes mellitus with diabetic peripheral angiopathy without gangrene: Secondary | ICD-10-CM | POA: Diagnosis not present

## 2020-07-20 DIAGNOSIS — D649 Anemia, unspecified: Secondary | ICD-10-CM | POA: Diagnosis not present

## 2020-07-20 DIAGNOSIS — I10 Essential (primary) hypertension: Secondary | ICD-10-CM | POA: Diagnosis not present

## 2020-07-20 DIAGNOSIS — N183 Chronic kidney disease, stage 3 unspecified: Secondary | ICD-10-CM | POA: Diagnosis not present

## 2020-07-20 DIAGNOSIS — E559 Vitamin D deficiency, unspecified: Secondary | ICD-10-CM | POA: Diagnosis not present

## 2020-07-21 DIAGNOSIS — D649 Anemia, unspecified: Secondary | ICD-10-CM | POA: Diagnosis not present

## 2020-07-21 DIAGNOSIS — I10 Essential (primary) hypertension: Secondary | ICD-10-CM | POA: Diagnosis not present

## 2020-07-21 DIAGNOSIS — E119 Type 2 diabetes mellitus without complications: Secondary | ICD-10-CM | POA: Diagnosis not present

## 2020-07-22 DIAGNOSIS — I10 Essential (primary) hypertension: Secondary | ICD-10-CM | POA: Diagnosis not present

## 2020-07-22 DIAGNOSIS — E559 Vitamin D deficiency, unspecified: Secondary | ICD-10-CM | POA: Diagnosis not present

## 2020-07-22 DIAGNOSIS — E1151 Type 2 diabetes mellitus with diabetic peripheral angiopathy without gangrene: Secondary | ICD-10-CM | POA: Diagnosis not present

## 2020-07-22 DIAGNOSIS — N183 Chronic kidney disease, stage 3 unspecified: Secondary | ICD-10-CM | POA: Diagnosis not present

## 2020-07-22 DIAGNOSIS — E1140 Type 2 diabetes mellitus with diabetic neuropathy, unspecified: Secondary | ICD-10-CM | POA: Diagnosis not present

## 2020-07-22 DIAGNOSIS — E785 Hyperlipidemia, unspecified: Secondary | ICD-10-CM | POA: Diagnosis not present

## 2020-07-22 DIAGNOSIS — I2510 Atherosclerotic heart disease of native coronary artery without angina pectoris: Secondary | ICD-10-CM | POA: Diagnosis not present

## 2020-07-22 DIAGNOSIS — E1159 Type 2 diabetes mellitus with other circulatory complications: Secondary | ICD-10-CM | POA: Diagnosis not present

## 2020-07-22 DIAGNOSIS — I480 Paroxysmal atrial fibrillation: Secondary | ICD-10-CM | POA: Diagnosis not present

## 2020-07-22 DIAGNOSIS — D649 Anemia, unspecified: Secondary | ICD-10-CM | POA: Diagnosis not present

## 2020-07-29 DIAGNOSIS — I2510 Atherosclerotic heart disease of native coronary artery without angina pectoris: Secondary | ICD-10-CM | POA: Diagnosis not present

## 2020-07-29 DIAGNOSIS — D649 Anemia, unspecified: Secondary | ICD-10-CM | POA: Diagnosis not present

## 2020-07-29 DIAGNOSIS — R5381 Other malaise: Secondary | ICD-10-CM | POA: Diagnosis not present

## 2020-07-29 DIAGNOSIS — K219 Gastro-esophageal reflux disease without esophagitis: Secondary | ICD-10-CM | POA: Diagnosis not present

## 2020-07-29 DIAGNOSIS — I10 Essential (primary) hypertension: Secondary | ICD-10-CM | POA: Diagnosis not present

## 2020-07-29 DIAGNOSIS — I152 Hypertension secondary to endocrine disorders: Secondary | ICD-10-CM | POA: Diagnosis not present

## 2020-07-29 DIAGNOSIS — R569 Unspecified convulsions: Secondary | ICD-10-CM | POA: Diagnosis not present

## 2020-07-29 DIAGNOSIS — E559 Vitamin D deficiency, unspecified: Secondary | ICD-10-CM | POA: Diagnosis not present

## 2020-07-29 DIAGNOSIS — E1140 Type 2 diabetes mellitus with diabetic neuropathy, unspecified: Secondary | ICD-10-CM | POA: Diagnosis not present

## 2020-07-29 DIAGNOSIS — N183 Chronic kidney disease, stage 3 unspecified: Secondary | ICD-10-CM | POA: Diagnosis not present

## 2020-08-06 DIAGNOSIS — R269 Unspecified abnormalities of gait and mobility: Secondary | ICD-10-CM | POA: Diagnosis not present

## 2020-08-06 DIAGNOSIS — R569 Unspecified convulsions: Secondary | ICD-10-CM | POA: Diagnosis not present

## 2020-08-06 DIAGNOSIS — N183 Chronic kidney disease, stage 3 unspecified: Secondary | ICD-10-CM | POA: Diagnosis not present

## 2020-08-06 DIAGNOSIS — I69398 Other sequelae of cerebral infarction: Secondary | ICD-10-CM | POA: Diagnosis not present

## 2020-08-06 DIAGNOSIS — I2510 Atherosclerotic heart disease of native coronary artery without angina pectoris: Secondary | ICD-10-CM | POA: Diagnosis not present

## 2020-08-10 ENCOUNTER — Ambulatory Visit: Payer: Medicare Other | Admitting: Adult Health

## 2020-08-15 DIAGNOSIS — E1151 Type 2 diabetes mellitus with diabetic peripheral angiopathy without gangrene: Secondary | ICD-10-CM | POA: Diagnosis not present

## 2020-08-15 DIAGNOSIS — E1140 Type 2 diabetes mellitus with diabetic neuropathy, unspecified: Secondary | ICD-10-CM | POA: Diagnosis not present

## 2020-08-15 DIAGNOSIS — I10 Essential (primary) hypertension: Secondary | ICD-10-CM | POA: Diagnosis not present

## 2020-08-15 DIAGNOSIS — D649 Anemia, unspecified: Secondary | ICD-10-CM | POA: Diagnosis not present

## 2020-08-15 DIAGNOSIS — E1159 Type 2 diabetes mellitus with other circulatory complications: Secondary | ICD-10-CM | POA: Diagnosis not present

## 2020-08-15 DIAGNOSIS — I2510 Atherosclerotic heart disease of native coronary artery without angina pectoris: Secondary | ICD-10-CM | POA: Diagnosis not present

## 2020-08-15 DIAGNOSIS — I480 Paroxysmal atrial fibrillation: Secondary | ICD-10-CM | POA: Diagnosis not present

## 2020-08-15 DIAGNOSIS — E559 Vitamin D deficiency, unspecified: Secondary | ICD-10-CM | POA: Diagnosis not present

## 2020-08-15 DIAGNOSIS — N183 Chronic kidney disease, stage 3 unspecified: Secondary | ICD-10-CM | POA: Diagnosis not present

## 2020-08-15 DIAGNOSIS — E785 Hyperlipidemia, unspecified: Secondary | ICD-10-CM | POA: Diagnosis not present

## 2020-08-16 ENCOUNTER — Ambulatory Visit: Payer: Medicare Other | Admitting: Podiatry

## 2020-08-16 DIAGNOSIS — E1151 Type 2 diabetes mellitus with diabetic peripheral angiopathy without gangrene: Secondary | ICD-10-CM | POA: Diagnosis not present

## 2020-08-16 DIAGNOSIS — R569 Unspecified convulsions: Secondary | ICD-10-CM | POA: Diagnosis not present

## 2020-08-16 DIAGNOSIS — I10 Essential (primary) hypertension: Secondary | ICD-10-CM | POA: Diagnosis not present

## 2020-08-16 DIAGNOSIS — K5900 Constipation, unspecified: Secondary | ICD-10-CM | POA: Diagnosis not present

## 2020-08-16 DIAGNOSIS — D649 Anemia, unspecified: Secondary | ICD-10-CM | POA: Diagnosis not present

## 2020-08-16 DIAGNOSIS — I2510 Atherosclerotic heart disease of native coronary artery without angina pectoris: Secondary | ICD-10-CM | POA: Diagnosis not present

## 2020-08-16 DIAGNOSIS — I151 Hypertension secondary to other renal disorders: Secondary | ICD-10-CM | POA: Diagnosis not present

## 2020-08-16 DIAGNOSIS — N183 Chronic kidney disease, stage 3 unspecified: Secondary | ICD-10-CM | POA: Diagnosis not present

## 2020-08-24 DIAGNOSIS — I2510 Atherosclerotic heart disease of native coronary artery without angina pectoris: Secondary | ICD-10-CM | POA: Diagnosis not present

## 2020-08-24 DIAGNOSIS — I480 Paroxysmal atrial fibrillation: Secondary | ICD-10-CM | POA: Diagnosis not present

## 2020-08-24 DIAGNOSIS — D649 Anemia, unspecified: Secondary | ICD-10-CM | POA: Diagnosis not present

## 2020-08-24 DIAGNOSIS — I152 Hypertension secondary to endocrine disorders: Secondary | ICD-10-CM | POA: Diagnosis not present

## 2020-08-24 DIAGNOSIS — E559 Vitamin D deficiency, unspecified: Secondary | ICD-10-CM | POA: Diagnosis not present

## 2020-08-24 DIAGNOSIS — N183 Chronic kidney disease, stage 3 unspecified: Secondary | ICD-10-CM | POA: Diagnosis not present

## 2020-08-24 DIAGNOSIS — R5381 Other malaise: Secondary | ICD-10-CM | POA: Diagnosis not present

## 2020-08-24 DIAGNOSIS — I10 Essential (primary) hypertension: Secondary | ICD-10-CM | POA: Diagnosis not present

## 2020-09-06 DIAGNOSIS — R569 Unspecified convulsions: Secondary | ICD-10-CM | POA: Diagnosis not present

## 2020-09-06 DIAGNOSIS — I69398 Other sequelae of cerebral infarction: Secondary | ICD-10-CM | POA: Diagnosis not present

## 2020-09-06 DIAGNOSIS — N183 Chronic kidney disease, stage 3 unspecified: Secondary | ICD-10-CM | POA: Diagnosis not present

## 2020-09-06 DIAGNOSIS — R269 Unspecified abnormalities of gait and mobility: Secondary | ICD-10-CM | POA: Diagnosis not present

## 2020-09-06 DIAGNOSIS — I2510 Atherosclerotic heart disease of native coronary artery without angina pectoris: Secondary | ICD-10-CM | POA: Diagnosis not present

## 2020-09-07 ENCOUNTER — Other Ambulatory Visit: Payer: Self-pay

## 2020-09-07 ENCOUNTER — Ambulatory Visit (INDEPENDENT_AMBULATORY_CARE_PROVIDER_SITE_OTHER): Payer: Medicare Other | Admitting: Adult Health

## 2020-09-07 ENCOUNTER — Encounter: Payer: Self-pay | Admitting: Adult Health

## 2020-09-07 VITALS — BP 105/76 | HR 89

## 2020-09-07 DIAGNOSIS — I480 Paroxysmal atrial fibrillation: Secondary | ICD-10-CM

## 2020-09-07 DIAGNOSIS — G459 Transient cerebral ischemic attack, unspecified: Secondary | ICD-10-CM | POA: Diagnosis not present

## 2020-09-07 DIAGNOSIS — E785 Hyperlipidemia, unspecified: Secondary | ICD-10-CM | POA: Diagnosis not present

## 2020-09-07 DIAGNOSIS — I6930 Unspecified sequelae of cerebral infarction: Secondary | ICD-10-CM

## 2020-09-07 DIAGNOSIS — F0150 Vascular dementia without behavioral disturbance: Secondary | ICD-10-CM

## 2020-09-07 DIAGNOSIS — I10 Essential (primary) hypertension: Secondary | ICD-10-CM | POA: Diagnosis not present

## 2020-09-07 NOTE — Patient Instructions (Signed)
Restart therapies - follow up/monitor right arm and leg pain - unknown cause but likely starting with therapies will be the best option for now - try to hold off on extensive testing if able unless symptoms worsen or do not improve.   Continue Eliquis (apixaban) daily  and atorvastatin  for secondary stroke prevention  Continue to follow up with PCP regarding cholesterol and blood pressure management  Maintain strict control of hypertension with blood pressure goal below 130/90 and cholesterol with LDL cholesterol (bad cholesterol) goal below 70 mg/dL.   Please see GI notes (attached) regarding diet- okay for soft food diet per GI recommendations during admission in April     Followup in the future with me in 4 months or call earlier if needed       Thank you for coming to see [place] at [address] [facility]. I hope we have been able to provide you high quality care today.  You may receive a patient satisfaction survey over the next few weeks. We would appreciate your feedback and comments so that we may continue to improve ourselves and the health of our patients.

## 2020-09-07 NOTE — Progress Notes (Signed)
[facility] [address] [place]. [place] [address] [phone]       HOSPITAL FOLLOW UP NOTE  [name] Date of Birth:  06-20-1945 Medical Record Number:  [id number]   Reason for Referral:  hospital stroke follow up    SUBJECTIVE:   CHIEF COMPLAINT:  Hospital follow up   HPI:   [name] is a 75 y.o. male with history of hypertension, diabetes, CKD, PVD, CAD, A. fib on Eliquis, AAA, vascular dementia admitted on 05/18/2020 for vomiting followed by right hemiparesis, right facial droop, and worsening slurred speech compared to baseline. No tPA given due to use of [facility]  Personally reviewed hospitalization pertinent progress notes, lab work and imaging.  Evaluated by Dr. Roda Shutters for possible TIA in setting of severe intracranial stenosis and relatively low BP.  MRI negative for acute infarct.  EEG moderate diffuse slowing, no seizure.  A1c 5.7.  Recommended continued Eliquis for known PAF.  Hx of HTN with hypotension during admission possibly related to recent weight loss, chronic dysphagia and NPO status -recommend long-term BP goal 130-150 given severe intracranial stenosis.  Increase atorvastatin 40 mg to 80 mg daily for LDL 102.  Prior stroke history 12/2011 with evidence of left pontine infarct.  Other stroke risk factors include advanced age, CAD and PAD as well as vascular dementia and recent weight loss with functional decline previously residing at [facility] for therapy needs. Per note review, presenting symptoms resolved and at baseline.  GI consulted for weight loss and ongoing dysphagia concerns with evidence of abnormal esophageal motility, distal esophageal web, small hiatal hernia and atrophic gastritis -GI recommended full liquid diet and consideration of feeding tube although patient declined - prior to d/c, he was cleared for soft food diet but if any swallowing issues, may need to proceed with tube placement.  He was discharged back to SNF on  05/23/2020.  Today, 09/07/2020, [name] is being seen for hospital follow up accompanied by his daughter, Brian Burns.  He continues to reside at [facility], [place].  Reports right arm and leg pain which apparently has been present since admission. He was diagnosed with COVID once he returned back to SNF at discharge and apparently did not receive any additional therapies as insurance declined.  Daughter reports speaking to facility who is trying to get approval for additional therapy.  Mainly transfers via wheelchair.  Chronic dysarthria stable at baseline. Per review of MAR, continues on Eliquis and atorvastatin.  Blood pressure today 105/76. Per daughter, facility maintains liquid diet although family brings him in soft food as they were cleared to do this by GI prior to discharge - no difficulties with swallowing.  He does have occasional vomiting.  No further concerns at this time.    ROS:   14 system review of systems performed and negative with exception of those listed in HPI  PMH:  Past Medical History:  Diagnosis Date   AAA (abdominal aortic aneurysm) without rupture (HCC) 06/2014   no change in last exam from 2015   Achalasia    Anxiety    Arthritis    knees,lower back   Atrial fibrillation (HCC)    Atrial flutter with rapid ventricular response (HCC) 01/16/2015   Bilateral wrist pain 09/09/2014   Bladder outlet obstruction 01/20/2015   Bradycardia 08/27/2016   CAD (coronary artery disease)    pt denies   Carotid artery disease (HCC)    Chronic back pain    Chronic kidney disease (  CKD), stage III (moderate) (HCC)    Decreased visual acuity of left eye  12/02/2018   Depression    Dyspnea    reports today no pregoression   Erectile dysfunction    GERD (gastroesophageal reflux disease)    Headache    hx of   History of CVA (cerebrovascular accident)    2009  &  2013   History of Multiple lacunar infarcts (HCC) 01/31/2015   MRI Brain 01/31/2015: Widespread old lacunar infarcts,  particularly right Basal ganglia, pons, and right cerebellum   HLD (hyperlipidemia)    Hypertension    Idiopathic pancreatitis 12/2014   acute   Knee pain, bilateral 04/09/2011   Malnutrition of moderate degree 01/25/2015   Memory impairment    mild dementia per PCP progress note    Morbid obesity (HCC) 12/03/2018   Other abnormalities of gait and mobility 12/03/2018   PVD (peripheral vascular disease) with claudication (HCC)    Urethral stricture 02/16/2013   Urge incontinence 12/08/2018    PSH:  Past Surgical History:  Procedure Laterality Date   back injections     [facility]   BALLOON DILATION N/A 12/16/2018   Procedure: URETHRAL BALLOON DILATION;  Surgeon: Crist Fat, MD;  Location: [facility];  Service: Urology;  Laterality: N/A;   BIOPSY  05/22/2020   Procedure: BIOPSY;  Surgeon: [name], MD;  Location: [facility];  Service: Gastroenterology;;   CARDIOVASCULAR STRESS TEST  08-09-2009   mild global hypokinesis,  no ischemia or scar/  ef 41%   CATARACT EXTRACTION W/ INTRAOCULAR LENS  IMPLANT, BILATERAL  april and may 2014   CYSTOSCOPY N/A 01/17/2017   Procedure: CYSTOSCOPY FLEXIBLE;  Surgeon: Crist Fat, MD;  Location: [facility];  Service: Urology;  Laterality: N/A;   CYSTOSCOPY WITH RETROGRADE URETHROGRAM N/A 11/24/2014   Procedure: CYSTOSCOPY WITH RETROGRADE URETHROGRAM;  Surgeon: Crist Fat, MD;  Location: [facility];  Service: Urology;  Laterality: N/A;   CYSTOSCOPY WITH RETROGRADE URETHROGRAM N/A 12/16/2018   Procedure: CYSTOSCOPY WITH RETROGRADE URETHROGRAM;  Surgeon: Crist Fat, MD;  Location: [facility];  Service: Urology;  Laterality: N/A;   CYSTOSCOPY WITH RETROGRADE URETHROGRAM N/A 12/16/2019   Procedure: CYSTOSCOPY WITH RETROGRADE, Direct Vision Internal Urethrotomy;  Surgeon: Crist Fat, MD;  Location: [facility];  Service: Urology;  Laterality: N/A;   CYSTOSCOPY WITH URETHRAL DILATATION N/A  11/24/2014   Procedure: CYSTOSCOPY WITH URETHRAL DILATATION;  Surgeon: Crist Fat, MD;  Location: [facility];  Service: Urology;  Laterality: N/A;  BALLOON DILATION         ESOPHAGEAL DILATION  05/22/2020   Procedure: ESOPHAGEAL DILATION;  Surgeon: [name], MD;  [facility]: [facility];  Service: Gastroenterology;;   ESOPHAGOGASTRODUODENOSCOPY N/A 05/15/2014   Procedure: ESOPHAGOGASTRODUODENOSCOPY (EGD);  Surgeon: Carman Ching, MD;  Location: [facility];  Service: Endoscopy;  Laterality: N/A;   ESOPHAGOGASTRODUODENOSCOPY Left 02/02/2015   Procedure: ESOPHAGOGASTRODUODENOSCOPY (EGD);  Surgeon: Dorena Cookey, MD;  Location[facility];  Service: Endoscopy;  Laterality: Left;   ESOPHAGOGASTRODUODENOSCOPY Left 03/16/2015   Procedure: ESOPHAGOGASTRODUODENOSCOPY (EGD);  Surgeon: Dorena Cookey, MD;  Location: [facility];  Service: Endoscopy;  Laterality: Left;   ESOPHAGOGASTRODUODENOSCOPY (EGD) WITH PROPOFOL N/A 05/22/2020   Procedure: ESOPHAGOGASTRODUODENOSCOPY (EGD) WITH PROPOFOL;  Surgeon: [name], MD;  Location: [facility];  Service: Gastroenterology;  Laterality: N/A;   FOREIGN BODY REMOVAL  05/22/2020   Procedure: FOREIGN BODY REMOVAL;  Surgeon: [name], MD;  Location: [facility];  Service: Gastroenterology;;   Krystal Clark  ECHOCARDIOGRAM  01-07-2012   mild to moderate dilated LV,  ef 45-50%,  mild basal hypokinesis,  grade I diastolic  dysfunction/  trivial AR/  mild MR   URETHROPLASTY N/A 01/17/2017   Procedure: URETHEROPLASTY BUCCAL GRAFT AND BUCCAL MUCOSA GRAFT HARVEST;  Surgeon: Crist FatHerrick, Benjamin W, MD;  Location: [facility];  Service: Urology;  Laterality: N/A;    Social History:  Social History   Socioeconomic History   Marital status: Married    Spouse name: Amadeo Garneteggy Kocsis   Number of children: 2    Years of education: 7   Highest education level: 7th grade  Occupational History   Occupation:  Public affairs consultantdishwasher at [employer]     Comment: during school year   Occupation: [employer]    Comment: on the weekends  Tobacco Use   Smoking status: Former    Packs/day: 0.50    Years: 40.00    Pack years: 20.00    Types: Cigarettes    Quit date: 01/29/1999    Years since quitting: 21.6   Smokeless tobacco: Never  Vaping Use   Vaping Use: Never used  Substance and Sexual Activity   Alcohol use: Never    Alcohol/week: 0.0 standard drinks   Drug use: Never   Sexual activity: Not Currently  Other Topics Concern   Not on file  Social History Narrative   Mr Brian Burns lives with his wife, Amadeo Garneteggy Bullard, in a one story, single family home dwelling with 4 steps into the home.    The Disla's receive Food & Nutrition Benefits and Social Security Income   Mr Leanord Asalhomas L Huie has given permission to share his private health information with his daughter, Ria BushCathy Downey ([phone]) 12/03/18   Ria Bushathy Downey is the agent for [name] Power of [employer]- The document was signed by patient and Notarized in [facility] office on 12/03/18.    Ms Samule OhmDowney manages her father's finances   Social Determinants of Health   Financial Resource Strain: Not on file  Food Insecurity: Not on file  Transportation Needs: Not on file  Physical Activity: Not on file  Stress: Not on file  Social Connections: Not on file  Intimate Partner Violence: Not on file    Family History:  Family History  Problem Relation Age of Onset   Lung disease Brother    Kidney disease Brother    Heart disease Brother        died at 5758   Heart disease Mother        died at 3276   Kidney disease Mother    Lung disease Mother     Medications:   Current Outpatient Medications on File Prior to Visit  Medication Sig Dispense Refill   acetaminophen (TYLENOL) 325 MG tablet Take 325 mg by mouth every 6 (six) hours as needed for moderate pain.     albuterol (ACCUNEB) 1.25 MG/3ML nebulizer solution SMARTSIG:3 Milliliter(s) Via Nebulizer Every 4-6 Hours PRN     alum & mag  hydroxide-simeth (MAALOX/MYLANTA) 200-200-20 MG/5ML suspension Take 30 mLs by mouth every 6 (six) hours as needed for indigestion or heartburn. 355 mL 0   apixaban (ELIQUIS) 5 MG TABS tablet Take 5 mg by mouth 2 (two) times daily.     atorvastatin (LIPITOR) 80 MG tablet Take 1 tablet (80 mg total) by mouth daily.     Cholecalciferol (VITAMIN D) 50 MCG (2000 UT) tablet Take 2,000 Units by mouth daily.     cyproheptadine (PERIACTIN) 4 MG tablet Take 4 mg  by mouth 3 (three) times daily as needed. For appetite     docusate sodium (COLACE) 100 MG capsule Take 100 mg by mouth 2 (two) times daily.     DULoxetine (CYMBALTA) 30 MG capsule Take 30 mg by mouth at bedtime.     DULoxetine (CYMBALTA) 60 MG capsule Take 60 mg by mouth at bedtime.     ferrous gluconate (FERGON) 324 MG tablet Take 324 mg by mouth daily with breakfast.     finasteride (PROSCAR) 5 MG tablet Take 5 mg by mouth daily.     lipase/protease/amylase (CREON) 12000-38000 units CPEP capsule Take by mouth 3 (three) times daily.     Multiple Vitamin (MULTIVITAMIN) capsule Take 1 capsule by mouth daily.     pantoprazole (PROTONIX) 40 MG tablet Take 1 tablet (40 mg total) by mouth daily.     polyethylene glycol (MIRALAX / GLYCOLAX) 17 g packet Take 17 g by mouth daily as needed. 14 each 0   potassium chloride (KLOR-CON) 10 MEQ tablet Take 1 tablet (10 mEq total) by mouth daily. 7 tablet 0   pregabalin (LYRICA) 50 MG capsule Take 50 mg by mouth 2 (two) times daily.     tamsulosin (FLOMAX) 0.4 MG CAPS capsule Take 0.4 mg by mouth.     traMADol (ULTRAM) 50 MG tablet Take 1 tablet (50 mg total) by mouth 3 (three) times daily as needed for moderate pain. 90 tablet 5   No current facility-administered medications on file prior to visit.    Allergies:   Allergies  Allergen Reactions   Gabapentin Other (See Comments)    Sedation at 300mg  TID      OBJECTIVE:  Physical Exam  Vitals:   09/07/20 1355  BP: 105/76  Pulse: 89   There is no  height or weight on file to calculate BMI. No results found.   General: Frail very pleasant elderly African-American male, seated, in no evident distress Head: head normocephalic and atraumatic.   Neck: supple with no carotid or supraclavicular bruits Cardiovascular: regular rate and rhythm, no murmurs Musculoskeletal: no deformity Skin:  no rash/petichiae Vascular:  Normal pulses all extremities   Neurologic Exam Mental Status: Awake and fully alert.  Moderate dysarthria.  Oriented to place and time. Recent memory impaired and remote memory intact. Attention span, concentration and fund of knowledge appropriate during visit. Mood and affect appropriate.  Cranial Nerves: Fundoscopic exam reveals sharp disc margins. Pupils equal, briskly reactive to light. Extraocular movements full without nystagmus. Visual fields full to confrontation. Hearing intact. Facial sensation intact. Face, tongue, palate moves normally and symmetrically.  Motor: Normal bulk and tone. Normal strength in all tested extremity muscles except possible mild RUE weakness although testing limited due to pain Sensory.: intact to touch , pinprick , position and vibratory sensation.  Coordination: Rapid alternating movements normal in all extremities. Finger-to-nose and heel-to-shin performed accurately bilaterally. Gait and Station: deferred Reflexes: 1+ and symmetric. Toes downgoing.         ASSESSMENT: Brian Burns is a 75 y.o. year old male with possible TIA on 05/18/2020 in setting of severe intracranial stenosis and relatively low BP after presenting with right-sided weakness, right facial droop and worsening slurred speech. Vascular risk factors include PAF on Eliquis, HTN, HLD, DM, severe intracranial stenosis, history of left pontine stroke 2013 with residual dysarthria, CAD, PAD, advanced age and vascular dementia. Prior to admission, residing at SNF due to functional decline with recent weight loss and chronic  dysphagia -he continues to  reside at [facility] SNF     PLAN:  TIA, hx of stroke: Continue Eliquis (apixaban) daily  and atorvastatin 80 mg daily for secondary stroke prevention.  Discussed secondary stroke prevention measures and importance of close PCP follow up for aggressive stroke risk factor management. I have gone over the pathophysiology of stroke, warning signs and symptoms, risk factors and their management in some detail with instructions to go to the closest emergency room for symptoms of concern. PAF: Continue Eliquis 5 mg twice daily managed by facility HTN: BP goal 130-150 due to critical stenosis.  Stable on lower side not currently on medication management.  HLD: LDL goal <70. Recent LDL 102 -on atorvastatin 80 mg daily.  Right arm pain: Unknown etiology.  Likely musculoskeletal in etiology.  Would recommend restarting therapies for hopeful benefit and if worsens or  persists, may consider further work up but due to multiple comorbidities, he would likely not be a candidate for any type of procedure.  He is currently on multiple medications for pain including duloxetine, acetaminophen, pregabalin and Tylenol managed by facility Vascular dementia: dx'd 2019. appears stable.  No behavioral concerns.  Monitored by facility    Follow up in 4 months or call earlier if needed   CC:  [facility] provider: Dr. Pearlean Brownie PCP: Sherron Monday, MD    I spent 57 minutes of face-to-face and non-face-to-face time with patient and daughter.  This included previsit chart review including review of recent hospitalization, lab review, study review, electronic health record documentation, patient and daughter education regarding recent TIA and hx of prior stroke, secondary stroke prevention measures and importance of managing stroke risk factors, right arm pain and further intervention, vascular dementia, and answered all other questions to patient and daughters satisfaction   Ihor Austin,  AGNP-BC  [facility] [address] [place], [place] [address]  Phone [phone] Fax [phone] Note: This document was prepared with digital dictation and possible smart phrase technology. Any transcriptional errors that result from this process are unintentional.

## 2020-09-08 ENCOUNTER — Encounter: Payer: Self-pay | Admitting: Podiatry

## 2020-09-08 ENCOUNTER — Other Ambulatory Visit: Payer: Self-pay

## 2020-09-08 ENCOUNTER — Ambulatory Visit (INDEPENDENT_AMBULATORY_CARE_PROVIDER_SITE_OTHER): Payer: Medicare Other | Admitting: Podiatry

## 2020-09-08 DIAGNOSIS — E119 Type 2 diabetes mellitus without complications: Secondary | ICD-10-CM

## 2020-09-08 DIAGNOSIS — M79674 Pain in right toe(s): Secondary | ICD-10-CM

## 2020-09-08 DIAGNOSIS — M79675 Pain in left toe(s): Secondary | ICD-10-CM

## 2020-09-08 DIAGNOSIS — B351 Tinea unguium: Secondary | ICD-10-CM

## 2020-09-08 DIAGNOSIS — I672 Cerebral atherosclerosis: Secondary | ICD-10-CM | POA: Diagnosis not present

## 2020-09-08 DIAGNOSIS — Z79899 Other long term (current) drug therapy: Secondary | ICD-10-CM | POA: Diagnosis not present

## 2020-09-11 NOTE — Progress Notes (Signed)
I agree with the above plan 

## 2020-09-11 NOTE — Progress Notes (Signed)
  Subjective:  Patient ID: Brian Burns, male    DOB: 08-Jan-1946,  MRN: [id number]  75 y.o. male presents with preventative diabetic foot care and painful thick toenails that are difficult to trim. Pain interferes with ambulation. Aggravating factors include wearing enclosed shoe gear. Pain is relieved with periodic professional debridement..    Patient is a resident of [facility]. His daughter is present during today's visit. They voice no new pedal problems on today's visit. He is unsure of blood sugar reading. Last charted A1c was 6.7% in April, 2021.  PCP: Sherron Monday, MD and last visit was: 04/13/2020  Review of Systems: Negative except as noted in the HPI.   Allergies  Allergen Reactions   Gabapentin Other (See Comments)    Sedation at 300mg  TID    Objective:  There were no vitals filed for this visit. Constitutional Patient is a pleasant 75 y.o. African American male in NAD. AAO x 3.  Vascular Capillary refill time to digits immediate b/l. Palpable pedal pulses b/l LE. Pedal hair absent. Lower extremity skin temperature gradient within normal limits. No pain with calf compression b/l. No edema noted b/l lower extremities. No cyanosis or clubbing noted.  Neurologic Normal speech. Protective sensation intact 5/5 intact bilaterally with 10g monofilament b/l.  Dermatologic Skin warm and supple b/l lower extremities. No open wounds b/l lower extremities. No interdigital macerations b/l lower extremities. Toenails 1-5 b/l elongated, discolored, dystrophic, thickened, crumbly with subungual debris and tenderness to dorsal palpation.  Orthopedic: Noted disuse atrophy b/l lower extremities. No pain crepitus or joint limitation noted with ROM b/l lower extremities. Utilizes wheelchair for mobility assistance.   Hemoglobin A1C Latest Ref Rng & Units 03/29/2020  HGBA1C 4.0 - 5.6 % 5.7(A)  Some recent data might be hidden       Assessment:   1. Pain due to onychomycosis  of toenails of both feet   2. Controlled type 2 diabetes mellitus without complication, without long-term current use of insulin (HCC)    Plan:  Patient was evaluated and treated and all questions answered.  Onychomycosis with pain -Nails palliatively debridement as below. -Educated on self-care  Procedure: Nail Debridement Rationale: Pain Type of Debridement: manual, sharp debridement. Instrumentation: Nail nipper, rotary burr. Number of Nails: 10  -Examined patient. -No new findings. No new orders. -Continue pressure precautions to avoid decubitus ulcerations. -Continue diabetic foot care principles: inspect feet daily, monitor glucose as recommended by PCP and/or Endocrinologist, and follow prescribed diet per PCP, Endocrinologist and/or dietician. -Patient to continue soft, supportive shoe gear daily. -Toenails 1-5 b/l were debrided in length and girth with sterile nail nippers and dremel without iatrogenic bleeding.  -Patient to report any pedal injuries to medical professional immediately. -Patient/POA to call should there be question/concern in the interim.  No follow-ups on file.  05/29/2020, DPM

## 2020-09-13 DIAGNOSIS — N1831 Chronic kidney disease, stage 3a: Secondary | ICD-10-CM | POA: Diagnosis not present

## 2020-09-13 DIAGNOSIS — N189 Chronic kidney disease, unspecified: Secondary | ICD-10-CM | POA: Diagnosis not present

## 2020-09-13 DIAGNOSIS — I129 Hypertensive chronic kidney disease with stage 1 through stage 4 chronic kidney disease, or unspecified chronic kidney disease: Secondary | ICD-10-CM | POA: Diagnosis not present

## 2020-09-13 DIAGNOSIS — R809 Proteinuria, unspecified: Secondary | ICD-10-CM | POA: Diagnosis not present

## 2020-09-13 DIAGNOSIS — D631 Anemia in chronic kidney disease: Secondary | ICD-10-CM | POA: Diagnosis not present

## 2020-09-13 DIAGNOSIS — N2581 Secondary hyperparathyroidism of renal origin: Secondary | ICD-10-CM | POA: Diagnosis not present

## 2020-09-15 DIAGNOSIS — N183 Chronic kidney disease, stage 3 unspecified: Secondary | ICD-10-CM | POA: Diagnosis not present

## 2020-09-15 DIAGNOSIS — R5381 Other malaise: Secondary | ICD-10-CM | POA: Diagnosis not present

## 2020-09-15 DIAGNOSIS — Z79899 Other long term (current) drug therapy: Secondary | ICD-10-CM | POA: Diagnosis not present

## 2020-09-15 DIAGNOSIS — I2510 Atherosclerotic heart disease of native coronary artery without angina pectoris: Secondary | ICD-10-CM | POA: Diagnosis not present

## 2020-09-15 DIAGNOSIS — E1151 Type 2 diabetes mellitus with diabetic peripheral angiopathy without gangrene: Secondary | ICD-10-CM | POA: Diagnosis not present

## 2020-09-15 DIAGNOSIS — E785 Hyperlipidemia, unspecified: Secondary | ICD-10-CM | POA: Diagnosis not present

## 2020-09-15 DIAGNOSIS — K5900 Constipation, unspecified: Secondary | ICD-10-CM | POA: Diagnosis not present

## 2020-09-16 DIAGNOSIS — I2510 Atherosclerotic heart disease of native coronary artery without angina pectoris: Secondary | ICD-10-CM | POA: Diagnosis not present

## 2020-09-16 DIAGNOSIS — E559 Vitamin D deficiency, unspecified: Secondary | ICD-10-CM | POA: Diagnosis not present

## 2020-09-16 DIAGNOSIS — N183 Chronic kidney disease, stage 3 unspecified: Secondary | ICD-10-CM | POA: Diagnosis not present

## 2020-09-16 DIAGNOSIS — E1140 Type 2 diabetes mellitus with diabetic neuropathy, unspecified: Secondary | ICD-10-CM | POA: Diagnosis not present

## 2020-09-16 DIAGNOSIS — I480 Paroxysmal atrial fibrillation: Secondary | ICD-10-CM | POA: Diagnosis not present

## 2020-09-16 DIAGNOSIS — E1159 Type 2 diabetes mellitus with other circulatory complications: Secondary | ICD-10-CM | POA: Diagnosis not present

## 2020-09-16 DIAGNOSIS — E1151 Type 2 diabetes mellitus with diabetic peripheral angiopathy without gangrene: Secondary | ICD-10-CM | POA: Diagnosis not present

## 2020-09-16 DIAGNOSIS — E785 Hyperlipidemia, unspecified: Secondary | ICD-10-CM | POA: Diagnosis not present

## 2020-09-16 DIAGNOSIS — D649 Anemia, unspecified: Secondary | ICD-10-CM | POA: Diagnosis not present

## 2020-09-16 DIAGNOSIS — I10 Essential (primary) hypertension: Secondary | ICD-10-CM | POA: Diagnosis not present

## 2020-09-21 DIAGNOSIS — K5900 Constipation, unspecified: Secondary | ICD-10-CM | POA: Diagnosis not present

## 2020-09-21 DIAGNOSIS — R300 Dysuria: Secondary | ICD-10-CM | POA: Diagnosis not present

## 2020-09-21 DIAGNOSIS — R5381 Other malaise: Secondary | ICD-10-CM | POA: Diagnosis not present

## 2020-09-21 DIAGNOSIS — N183 Chronic kidney disease, stage 3 unspecified: Secondary | ICD-10-CM | POA: Diagnosis not present

## 2020-09-21 DIAGNOSIS — K219 Gastro-esophageal reflux disease without esophagitis: Secondary | ICD-10-CM | POA: Diagnosis not present

## 2020-09-21 DIAGNOSIS — E785 Hyperlipidemia, unspecified: Secondary | ICD-10-CM | POA: Diagnosis not present

## 2020-09-21 DIAGNOSIS — I2510 Atherosclerotic heart disease of native coronary artery without angina pectoris: Secondary | ICD-10-CM | POA: Diagnosis not present

## 2020-09-30 DIAGNOSIS — R300 Dysuria: Secondary | ICD-10-CM | POA: Diagnosis not present

## 2020-10-02 DIAGNOSIS — N390 Urinary tract infection, site not specified: Secondary | ICD-10-CM | POA: Diagnosis not present

## 2020-10-07 DIAGNOSIS — I10 Essential (primary) hypertension: Secondary | ICD-10-CM | POA: Diagnosis not present

## 2020-10-07 DIAGNOSIS — E785 Hyperlipidemia, unspecified: Secondary | ICD-10-CM | POA: Diagnosis not present

## 2020-10-07 DIAGNOSIS — I2510 Atherosclerotic heart disease of native coronary artery without angina pectoris: Secondary | ICD-10-CM | POA: Diagnosis not present

## 2020-10-07 DIAGNOSIS — N183 Chronic kidney disease, stage 3 unspecified: Secondary | ICD-10-CM | POA: Diagnosis not present

## 2020-10-07 DIAGNOSIS — I69398 Other sequelae of cerebral infarction: Secondary | ICD-10-CM | POA: Diagnosis not present

## 2020-10-07 DIAGNOSIS — D649 Anemia, unspecified: Secondary | ICD-10-CM | POA: Diagnosis not present

## 2020-10-07 DIAGNOSIS — I152 Hypertension secondary to endocrine disorders: Secondary | ICD-10-CM | POA: Diagnosis not present

## 2020-10-07 DIAGNOSIS — R269 Unspecified abnormalities of gait and mobility: Secondary | ICD-10-CM | POA: Diagnosis not present

## 2020-10-07 DIAGNOSIS — R569 Unspecified convulsions: Secondary | ICD-10-CM | POA: Diagnosis not present

## 2020-10-07 DIAGNOSIS — E1140 Type 2 diabetes mellitus with diabetic neuropathy, unspecified: Secondary | ICD-10-CM | POA: Diagnosis not present

## 2020-10-07 DIAGNOSIS — I480 Paroxysmal atrial fibrillation: Secondary | ICD-10-CM | POA: Diagnosis not present

## 2020-10-07 DIAGNOSIS — E1159 Type 2 diabetes mellitus with other circulatory complications: Secondary | ICD-10-CM | POA: Diagnosis not present

## 2020-10-07 DIAGNOSIS — E559 Vitamin D deficiency, unspecified: Secondary | ICD-10-CM | POA: Diagnosis not present

## 2020-10-10 DIAGNOSIS — R1312 Dysphagia, oropharyngeal phase: Secondary | ICD-10-CM | POA: Diagnosis not present

## 2020-10-11 DIAGNOSIS — R1312 Dysphagia, oropharyngeal phase: Secondary | ICD-10-CM | POA: Diagnosis not present

## 2020-10-12 DIAGNOSIS — R1312 Dysphagia, oropharyngeal phase: Secondary | ICD-10-CM | POA: Diagnosis not present

## 2020-10-13 DIAGNOSIS — R1319 Other dysphagia: Secondary | ICD-10-CM | POA: Diagnosis not present

## 2020-10-13 DIAGNOSIS — I2510 Atherosclerotic heart disease of native coronary artery without angina pectoris: Secondary | ICD-10-CM | POA: Diagnosis not present

## 2020-10-13 DIAGNOSIS — R1312 Dysphagia, oropharyngeal phase: Secondary | ICD-10-CM | POA: Diagnosis not present

## 2020-10-13 DIAGNOSIS — F32A Depression, unspecified: Secondary | ICD-10-CM | POA: Diagnosis not present

## 2020-10-13 DIAGNOSIS — I480 Paroxysmal atrial fibrillation: Secondary | ICD-10-CM | POA: Diagnosis not present

## 2020-10-13 DIAGNOSIS — E1151 Type 2 diabetes mellitus with diabetic peripheral angiopathy without gangrene: Secondary | ICD-10-CM | POA: Diagnosis not present

## 2020-10-13 DIAGNOSIS — E785 Hyperlipidemia, unspecified: Secondary | ICD-10-CM | POA: Diagnosis not present

## 2020-10-13 DIAGNOSIS — I10 Essential (primary) hypertension: Secondary | ICD-10-CM | POA: Diagnosis not present

## 2020-10-13 DIAGNOSIS — I70209 Unspecified atherosclerosis of native arteries of extremities, unspecified extremity: Secondary | ICD-10-CM | POA: Diagnosis not present

## 2020-10-13 DIAGNOSIS — E559 Vitamin D deficiency, unspecified: Secondary | ICD-10-CM | POA: Diagnosis not present

## 2020-10-13 DIAGNOSIS — R5381 Other malaise: Secondary | ICD-10-CM | POA: Diagnosis not present

## 2020-10-16 DIAGNOSIS — R1312 Dysphagia, oropharyngeal phase: Secondary | ICD-10-CM | POA: Diagnosis not present

## 2020-10-17 DIAGNOSIS — R1312 Dysphagia, oropharyngeal phase: Secondary | ICD-10-CM | POA: Diagnosis not present

## 2020-10-18 DIAGNOSIS — R1312 Dysphagia, oropharyngeal phase: Secondary | ICD-10-CM | POA: Diagnosis not present

## 2020-10-19 DIAGNOSIS — I2510 Atherosclerotic heart disease of native coronary artery without angina pectoris: Secondary | ICD-10-CM | POA: Diagnosis not present

## 2020-10-19 DIAGNOSIS — I480 Paroxysmal atrial fibrillation: Secondary | ICD-10-CM | POA: Diagnosis not present

## 2020-10-19 DIAGNOSIS — N183 Chronic kidney disease, stage 3 unspecified: Secondary | ICD-10-CM | POA: Diagnosis not present

## 2020-10-19 DIAGNOSIS — F32A Depression, unspecified: Secondary | ICD-10-CM | POA: Diagnosis not present

## 2020-10-19 DIAGNOSIS — R5381 Other malaise: Secondary | ICD-10-CM | POA: Diagnosis not present

## 2020-10-19 DIAGNOSIS — R1312 Dysphagia, oropharyngeal phase: Secondary | ICD-10-CM | POA: Diagnosis not present

## 2020-10-20 DIAGNOSIS — R1312 Dysphagia, oropharyngeal phase: Secondary | ICD-10-CM | POA: Diagnosis not present

## 2020-10-23 DIAGNOSIS — R1312 Dysphagia, oropharyngeal phase: Secondary | ICD-10-CM | POA: Diagnosis not present

## 2020-10-24 DIAGNOSIS — R1312 Dysphagia, oropharyngeal phase: Secondary | ICD-10-CM | POA: Diagnosis not present

## 2020-10-25 DIAGNOSIS — R1312 Dysphagia, oropharyngeal phase: Secondary | ICD-10-CM | POA: Diagnosis not present

## 2020-10-26 DIAGNOSIS — R1312 Dysphagia, oropharyngeal phase: Secondary | ICD-10-CM | POA: Diagnosis not present

## 2020-10-30 DIAGNOSIS — I69398 Other sequelae of cerebral infarction: Secondary | ICD-10-CM | POA: Diagnosis not present

## 2020-11-06 DIAGNOSIS — I480 Paroxysmal atrial fibrillation: Secondary | ICD-10-CM | POA: Diagnosis not present

## 2020-11-06 DIAGNOSIS — R569 Unspecified convulsions: Secondary | ICD-10-CM | POA: Diagnosis not present

## 2020-11-06 DIAGNOSIS — D649 Anemia, unspecified: Secondary | ICD-10-CM | POA: Diagnosis not present

## 2020-11-06 DIAGNOSIS — E1151 Type 2 diabetes mellitus with diabetic peripheral angiopathy without gangrene: Secondary | ICD-10-CM | POA: Diagnosis not present

## 2020-11-06 DIAGNOSIS — E785 Hyperlipidemia, unspecified: Secondary | ICD-10-CM | POA: Diagnosis not present

## 2020-11-06 DIAGNOSIS — E559 Vitamin D deficiency, unspecified: Secondary | ICD-10-CM | POA: Diagnosis not present

## 2020-11-06 DIAGNOSIS — I2510 Atherosclerotic heart disease of native coronary artery without angina pectoris: Secondary | ICD-10-CM | POA: Diagnosis not present

## 2020-11-06 DIAGNOSIS — E1159 Type 2 diabetes mellitus with other circulatory complications: Secondary | ICD-10-CM | POA: Diagnosis not present

## 2020-11-06 DIAGNOSIS — N183 Chronic kidney disease, stage 3 unspecified: Secondary | ICD-10-CM | POA: Diagnosis not present

## 2020-11-06 DIAGNOSIS — E1140 Type 2 diabetes mellitus with diabetic neuropathy, unspecified: Secondary | ICD-10-CM | POA: Diagnosis not present

## 2020-11-06 DIAGNOSIS — I152 Hypertension secondary to endocrine disorders: Secondary | ICD-10-CM | POA: Diagnosis not present

## 2020-11-06 DIAGNOSIS — I10 Essential (primary) hypertension: Secondary | ICD-10-CM | POA: Diagnosis not present

## 2020-11-06 DIAGNOSIS — I69398 Other sequelae of cerebral infarction: Secondary | ICD-10-CM | POA: Diagnosis not present

## 2020-11-06 DIAGNOSIS — R269 Unspecified abnormalities of gait and mobility: Secondary | ICD-10-CM | POA: Diagnosis not present

## 2020-11-15 DIAGNOSIS — E1159 Type 2 diabetes mellitus with other circulatory complications: Secondary | ICD-10-CM | POA: Diagnosis not present

## 2020-11-15 DIAGNOSIS — I2510 Atherosclerotic heart disease of native coronary artery without angina pectoris: Secondary | ICD-10-CM | POA: Diagnosis not present

## 2020-11-15 DIAGNOSIS — R5381 Other malaise: Secondary | ICD-10-CM | POA: Diagnosis not present

## 2020-11-15 DIAGNOSIS — E785 Hyperlipidemia, unspecified: Secondary | ICD-10-CM | POA: Diagnosis not present

## 2020-11-15 DIAGNOSIS — E559 Vitamin D deficiency, unspecified: Secondary | ICD-10-CM | POA: Diagnosis not present

## 2020-11-15 DIAGNOSIS — I480 Paroxysmal atrial fibrillation: Secondary | ICD-10-CM | POA: Diagnosis not present

## 2020-11-15 DIAGNOSIS — R1319 Other dysphagia: Secondary | ICD-10-CM | POA: Diagnosis not present

## 2020-11-15 DIAGNOSIS — N183 Chronic kidney disease, stage 3 unspecified: Secondary | ICD-10-CM | POA: Diagnosis not present

## 2020-11-15 DIAGNOSIS — I152 Hypertension secondary to endocrine disorders: Secondary | ICD-10-CM | POA: Diagnosis not present

## 2020-11-16 DIAGNOSIS — I152 Hypertension secondary to endocrine disorders: Secondary | ICD-10-CM | POA: Diagnosis not present

## 2020-11-16 DIAGNOSIS — E1142 Type 2 diabetes mellitus with diabetic polyneuropathy: Secondary | ICD-10-CM | POA: Diagnosis not present

## 2020-11-16 DIAGNOSIS — I480 Paroxysmal atrial fibrillation: Secondary | ICD-10-CM | POA: Diagnosis not present

## 2020-11-16 DIAGNOSIS — I2510 Atherosclerotic heart disease of native coronary artery without angina pectoris: Secondary | ICD-10-CM | POA: Diagnosis not present

## 2020-11-16 DIAGNOSIS — E785 Hyperlipidemia, unspecified: Secondary | ICD-10-CM | POA: Diagnosis not present

## 2020-11-16 DIAGNOSIS — E1159 Type 2 diabetes mellitus with other circulatory complications: Secondary | ICD-10-CM | POA: Diagnosis not present

## 2020-11-16 DIAGNOSIS — N183 Chronic kidney disease, stage 3 unspecified: Secondary | ICD-10-CM | POA: Diagnosis not present

## 2020-11-16 DIAGNOSIS — R5381 Other malaise: Secondary | ICD-10-CM | POA: Diagnosis not present

## 2020-11-16 DIAGNOSIS — E559 Vitamin D deficiency, unspecified: Secondary | ICD-10-CM | POA: Diagnosis not present

## 2020-11-16 DIAGNOSIS — R1319 Other dysphagia: Secondary | ICD-10-CM | POA: Diagnosis not present

## 2020-11-27 DIAGNOSIS — I69398 Other sequelae of cerebral infarction: Secondary | ICD-10-CM | POA: Diagnosis not present

## 2020-11-27 DIAGNOSIS — F01C3 Vascular dementia, severe, with mood disturbance: Secondary | ICD-10-CM | POA: Diagnosis not present

## 2020-12-07 DIAGNOSIS — I2510 Atherosclerotic heart disease of native coronary artery without angina pectoris: Secondary | ICD-10-CM | POA: Diagnosis not present

## 2020-12-07 DIAGNOSIS — I69398 Other sequelae of cerebral infarction: Secondary | ICD-10-CM | POA: Diagnosis not present

## 2020-12-07 DIAGNOSIS — R569 Unspecified convulsions: Secondary | ICD-10-CM | POA: Diagnosis not present

## 2020-12-07 DIAGNOSIS — N183 Chronic kidney disease, stage 3 unspecified: Secondary | ICD-10-CM | POA: Diagnosis not present

## 2020-12-07 DIAGNOSIS — R269 Unspecified abnormalities of gait and mobility: Secondary | ICD-10-CM | POA: Diagnosis not present

## 2020-12-15 ENCOUNTER — Other Ambulatory Visit: Payer: Self-pay

## 2020-12-15 ENCOUNTER — Encounter: Payer: Self-pay | Admitting: Podiatry

## 2020-12-15 ENCOUNTER — Ambulatory Visit (INDEPENDENT_AMBULATORY_CARE_PROVIDER_SITE_OTHER): Payer: Medicare Other | Admitting: Podiatry

## 2020-12-15 DIAGNOSIS — I70223 Atherosclerosis of native arteries of extremities with rest pain, bilateral legs: Secondary | ICD-10-CM

## 2020-12-15 DIAGNOSIS — M79675 Pain in left toe(s): Secondary | ICD-10-CM | POA: Diagnosis not present

## 2020-12-15 DIAGNOSIS — E119 Type 2 diabetes mellitus without complications: Secondary | ICD-10-CM

## 2020-12-15 DIAGNOSIS — M2041 Other hammer toe(s) (acquired), right foot: Secondary | ICD-10-CM | POA: Diagnosis not present

## 2020-12-15 DIAGNOSIS — B351 Tinea unguium: Secondary | ICD-10-CM | POA: Diagnosis not present

## 2020-12-15 DIAGNOSIS — E1151 Type 2 diabetes mellitus with diabetic peripheral angiopathy without gangrene: Secondary | ICD-10-CM | POA: Diagnosis not present

## 2020-12-15 DIAGNOSIS — R0989 Other specified symptoms and signs involving the circulatory and respiratory systems: Secondary | ICD-10-CM

## 2020-12-15 DIAGNOSIS — M79674 Pain in right toe(s): Secondary | ICD-10-CM

## 2020-12-15 DIAGNOSIS — M2042 Other hammer toe(s) (acquired), left foot: Secondary | ICD-10-CM

## 2020-12-15 NOTE — Progress Notes (Signed)
ANNUAL DIABETIC FOOT EXAM  Subjective: Brian Burns presents today for for annual diabetic foot examination and painful elongated mycotic toenails 1-5 bilaterally which are tender when wearing enclosed shoe gear. Pain is relieved with periodic professional debridement.  He is a nursing home resident at [facility] at [place]. He is accompanied by his daughter, [name], on today's visit. Family is very involved in his care. Wife and daughter visit regularly and participate in his care.  Today, [name] states he is experiencing burning on the top of both feet when lying in bed. Daughter endorses he is bedbound most of the time at the facility. Wife checks him daily for any skin breakdown.He does have known back problems as well.  He has been followed by Vein and Vascular Surgery, Dr. Ruta Hinds, in the past.   Patient relates 5 year h/o diabetes.  Patient denies any h/o foot wounds.  Patient endorses symptoms of burning in feet.  Patient denies any symptoms of pins/needles in feet.  He did have a fingerstick performed at the facility today, but they are unaware of what the reading was.  Jodi Marble, MD is patient's PCP. Last visit was 08/24/2020.  Past Medical History:  Diagnosis Date   AAA (abdominal aortic aneurysm) without rupture 06/2014   no change in last exam from 2015   Achalasia    Anxiety    Arthritis    knees,lower back   Atrial fibrillation (HCC)    Atrial flutter with rapid ventricular response ([place]) 01/16/2015   Bilateral wrist pain 09/09/2014   Bladder outlet obstruction 01/20/2015   Bradycardia 08/27/2016   CAD (coronary artery disease)    pt denies   Carotid artery disease (HCC)    Chronic back pain    Chronic kidney disease (CKD), stage III (moderate) (HCC)    Decreased visual acuity of left eye  12/02/2018   Depression    Dyspnea    reports today no pregoression   Erectile dysfunction    GERD (gastroesophageal reflux disease)     Headache    hx of   History of CVA (cerebrovascular accident)    2009  &  2013   History of Multiple lacunar infarcts ([place]) 01/31/2015   MRI Brain 01/31/2015: Widespread old lacunar infarcts, particularly right Basal ganglia, pons, and right cerebellum   HLD (hyperlipidemia)    Hypertension    Idiopathic pancreatitis 12/2014   acute   Knee pain, bilateral 04/09/2011   Malnutrition of moderate degree 01/25/2015   Memory impairment    mild dementia per PCP progress note    Morbid obesity ([place]) 12/03/2018   Other abnormalities of gait and mobility 12/03/2018   PVD (peripheral vascular disease) with claudication ([place])    Urethral stricture 02/16/2013   Urge incontinence 12/08/2018   Patient Active Problem List   Diagnosis Date Noted   Altered mental status    Acute right hemiparesis ([place]) 05/18/2020   Abnormality of gait 09/27/2019   Seizure ([place])    Urge incontinence 12/08/2018   Stenosis of lateral recess of lumbar spine 12/08/2018   Dementia, vascular ([place]) 12/08/2018   Decreased visual acuity of left eye  12/02/2018   Gross hematuria 10/29/2018   Other abnormalities of gait and mobility 09/02/2018   Muscle weakness (generalized) 04/08/2018   Lumbar spinal stenosis 04/05/2018   Chronic pain of right knee 03/07/2017   Chronic low back pain without sciatica 03/07/2017   Nonischemic cardiomyopathy ([place]) 09/25/2016   Microalbuminuria 07/26/2015   Paroxysmal  atrial fibrillation (HCC)    Coronary artery disease involving native coronary artery of native heart without angina pectoris    Stricture, urethra    CKD stage 3 due to type 2 diabetes mellitus ([place]) 03/16/2015   Depression 03/16/2015   Normocytic anemia    History of Multiple lacunar infarcts ([place]) 01/31/2015   Esophageal dysphagia    Moderate protein-calorie malnutrition ([place]) 01/16/2015   Unintentional weight loss 03/18/2014   GERD (gastroesophageal reflux disease) 10/21/2013   AAA (abdominal aortic aneurysm) without  rupture ([place]) 06/24/2013   PVD (peripheral vascular disease) ([place]) 06/24/2013   Orthostatic dizziness 03/18/2013   Hearing loss of both ears 03/18/2013   BPH (benign prostatic hyperplasia) 02/16/2013   Type 2 diabetes mellitus with diabetic neuropathy, unspecified ([place]) 05/10/2012   Type 2 diabetes mellitus with hypercholesterolemia ([place]) 02/05/2007   Hypertension associated with diabetes ([place]) 03/27/2006   Past Surgical History:  Procedure Laterality Date   back injections     [facility]   BALLOON DILATION N/A 12/16/2018   Procedure: URETHRAL BALLOON DILATION;  Surgeon: Ardis Hughs, MD;  Location: [facility];  Service: Urology;  Laterality: N/A;   BIOPSY  05/22/2020   Procedure: BIOPSY;  Surgeon: Otis Brace, MD;  Location: [facility];  Service: Gastroenterology;;   CARDIOVASCULAR STRESS TEST  08-09-2009   mild global hypokinesis,  no ischemia or scar/  ef 41%   CATARACT EXTRACTION W/ INTRAOCULAR LENS  IMPLANT, BILATERAL  april and may 2014   CYSTOSCOPY N/A 01/17/2017   Procedure: CYSTOSCOPY FLEXIBLE;  Surgeon: Ardis Hughs, MD;  [facility];  Service: Urology;  Laterality: N/A;   CYSTOSCOPY WITH RETROGRADE URETHROGRAM N/A 11/24/2014   Procedure: CYSTOSCOPY WITH RETROGRADE URETHROGRAM;  Surgeon: Ardis Hughs, MD;  [facility];  Service: Urology;  Laterality: N/A;   CYSTOSCOPY WITH RETROGRADE URETHROGRAM N/A 12/16/2018   Procedure: CYSTOSCOPY WITH RETROGRADE URETHROGRAM;  Surgeon: Ardis Hughs, MD;  [facility];  Service: Urology;  Laterality: N/A;   CYSTOSCOPY WITH RETROGRADE URETHROGRAM N/A 12/16/2019   Procedure: CYSTOSCOPY WITH RETROGRADE, Direct Vision Internal Urethrotomy;  Surgeon: Ardis Hughs, MD;  [facility];  Service: Urology;  Laterality: N/A;   CYSTOSCOPY WITH URETHRAL DILATATION N/A 11/24/2014   Procedure: CYSTOSCOPY WITH URETHRAL DILATATION;  Surgeon: Ardis Hughs, MD;   [facility];  Service: Urology;  Laterality: N/A;  BALLOON DILATION         ESOPHAGEAL DILATION  05/22/2020   Procedure: ESOPHAGEAL DILATION;  Surgeon: Otis Brace, MD;  Location: [facility];  Service: Gastroenterology;;   ESOPHAGOGASTRODUODENOSCOPY N/A 05/15/2014   Procedure: ESOPHAGOGASTRODUODENOSCOPY (EGD);  Surgeon: Laurence Spates, MD;  Location: [facility];  Service: Endoscopy;  Laterality: N/A;   ESOPHAGOGASTRODUODENOSCOPY Left 02/02/2015   Procedure: ESOPHAGOGASTRODUODENOSCOPY (EGD);  Surgeon: Teena Irani, MD;  Location: [facility];  Service: Endoscopy;  Laterality: Left;   ESOPHAGOGASTRODUODENOSCOPY Left 03/16/2015   Procedure: ESOPHAGOGASTRODUODENOSCOPY (EGD);  Surgeon: Teena Irani, MD;  Location: [facility];  Service: Endoscopy;  Laterality: Left;   ESOPHAGOGASTRODUODENOSCOPY (EGD) WITH PROPOFOL N/A 05/22/2020   Procedure: ESOPHAGOGASTRODUODENOSCOPY (EGD) WITH PROPOFOL;  Surgeon: Otis Brace, MD;  Location: [facility];  Service: Gastroenterology;  Laterality: N/A;   FOREIGN BODY REMOVAL  05/22/2020   Procedure: FOREIGN BODY REMOVAL;  Surgeon: Otis Brace, MD;  Location: [facility];  Service: Gastroenterology;;   TRANSTHORACIC ECHOCARDIOGRAM  01-07-2012   mild to moderate dilated LV,  ef 45-50%,  mild basal hypokinesis,  grade I diastolic  dysfunction/  trivial AR/  mild MR   URETHROPLASTY N/A 01/17/2017   Procedure: URETHEROPLASTY BUCCAL GRAFT AND BUCCAL MUCOSA GRAFT HARVEST;  Surgeon: Crist Fat, MD;  [facility];  Service: Urology;  Laterality: N/A;   Current Outpatient Medications on File Prior to Visit  Medication Sig Dispense Refill   acetaminophen (TYLENOL) 325 MG tablet Take 325 mg by mouth every 6 (six) hours as needed for moderate pain.     albuterol (ACCUNEB) 1.25 MG/3ML nebulizer solution SMARTSIG:3 Milliliter(s) Via Nebulizer Every 4-6 Hours PRN     alum & mag hydroxide-simeth (MAALOX/MYLANTA) 200-200-20  MG/5ML suspension Take 30 mLs by mouth every 6 (six) hours as needed for indigestion or heartburn. 355 mL 0   apixaban (ELIQUIS) 5 MG TABS tablet Take 5 mg by mouth 2 (two) times daily.     atorvastatin (LIPITOR) 80 MG tablet Take 1 tablet (80 mg total) by mouth daily.     Cholecalciferol (VITAMIN D) 50 MCG (2000 UT) tablet Take 2,000 Units by mouth daily.     cyproheptadine (PERIACTIN) 4 MG tablet Take 4 mg by mouth 3 (three) times daily as needed. For appetite     docusate sodium (COLACE) 100 MG capsule Take 100 mg by mouth 2 (two) times daily.     DULoxetine (CYMBALTA) 30 MG capsule Take 30 mg by mouth at bedtime.     DULoxetine (CYMBALTA) 60 MG capsule Take 60 mg by mouth at bedtime.     ferrous gluconate (FERGON) 324 MG tablet Take 324 mg by mouth daily with breakfast.     finasteride (PROSCAR) 5 MG tablet Take 5 mg by mouth daily.     lipase/protease/amylase (CREON) 12000-38000 units CPEP capsule Take by mouth 3 (three) times daily.     Multiple Vitamin (MULTIVITAMIN) capsule Take 1 capsule by mouth daily.     pantoprazole (PROTONIX) 40 MG tablet Take 1 tablet (40 mg total) by mouth daily.     polyethylene glycol (MIRALAX / GLYCOLAX) 17 g packet Take 17 g by mouth daily as needed. 14 each 0   potassium chloride (KLOR-CON) 10 MEQ tablet Take 1 tablet (10 mEq total) by mouth daily. 7 tablet 0   pregabalin (LYRICA) 50 MG capsule Take 50 mg by mouth 2 (two) times daily.     tamsulosin (FLOMAX) 0.4 MG CAPS capsule Take 0.4 mg by mouth.     traMADol (ULTRAM) 50 MG tablet Take 1 tablet (50 mg total) by mouth 3 (three) times daily as needed for moderate pain. 90 tablet 5   No current facility-administered medications on file prior to visit.    Allergies  Allergen Reactions   Gabapentin Other (See Comments)    Sedation at 300mg  TID   Social History   Occupational History   Occupation:  at [employer]    Comment: during school year   Occupation: [employer]    Comment: on the  weekends  Tobacco Use   Smoking status: Former    Packs/day: 0.50    Years: 40.00    Pack years: 20.00    Types: Cigarettes    Quit date: 01/29/1999    Years since quitting: 21.8   Smokeless tobacco: Never  Vaping Use   Vaping Use: Never used  Substance and Sexual Activity   Alcohol use: Never    Alcohol/week: 0.0 standard drinks   Drug use: Never   Sexual activity: Not Currently   Family History  Problem Relation Age of Onset   Lung disease Brother  Kidney disease Brother    Heart disease Brother        died at 82   Heart disease Mother        died at 108   Kidney disease Mother    Lung disease Mother    Immunization History  Administered Date(s) Administered   Fluad Quad(high Dose 65+) 04/06/2020   Influenza,inj,Quad PF,6+ Mos 02/16/2013, 10/21/2013, 01/16/2015, 10/09/2015, 11/24/2017, 10/29/2018   Pneumococcal Conjugate-13 07/20/2015   Pneumococcal Polysaccharide-23 11/24/2017   Td 05/29/2002   Zoster Recombinat (Shingrix) 08/28/2016, 01/15/2018, 03/26/2018     Review of Systems: Negative except as noted in the HPI.   Objective: There were no vitals filed for this visit.  DHYAAN STRAKA is a pleasant 75 y.o. male in NAD. AAO X 3.  Vascular Examination: CFT <4 seconds b/l LE. Nonpalpable DP pulse(s) b/l LE. Nonpalpable PT pulse(s) b/l LE. Pedal hair absent. No pain with calf compression RLE. Lower extremity skin temperature gradient warm to cool. Trace edema noted BLE. No ischemia or gangrene noted b/l lower extremities. No cyanosis or clubbing noted b/l LE.  Dermatological Examination: Pedal skin with normal turgor, texture and tone b/l lower extremities. No open wounds b/l LE. No interdigital macerations noted b/l LE. Toenails 1-5 b/l elongated, discolored, dystrophic, thickened, crumbly with subungual debris and tenderness to dorsal palpation. No evidence of heel/ankle decubitus ulcerations b/l LE. No hyperkeratotic nor porokeratotic lesions present on today's  visit.  Musculoskeletal Examination: Normal muscle strength 5/5 to all lower extremity muscle groups bilaterally. No pain, crepitus or joint limitation noted with ROM b/l LE. No gross bony pedal deformities b/l. Patient ambulates independently without assistive aids. Noted disuse atrophy bilaterally. Hammertoe deformity noted 2-5 b/l. Utilizes wheelchair for mobility assistance.  Footwear Assessment: Does the patient wear appropriate shoes? Yes. Does the patient need inserts/orthotics? No.  Neurological Examination: Protective sensation intact 5/5 intact bilaterally with 10g monofilament b/l. Vibratory sensation decreased b/l.  Hemoglobin A1C Latest Ref Rng & Units 03/29/2020  HGBA1C 4.0 - 5.6 % 5.7(A)  Some recent data might be hidden   Assessment: 1. Pain due to onychomycosis of toenails of both feet   2. Diminished pulses in lower extremity   3. Rest pain of both lower extremities due to atherosclerosis ([place])   4. Acquired hammertoes of both feet   5. Type II diabetes mellitus with peripheral circulatory disorder (HCC)   6. Encounter for diabetic foot exam ([place])     ADA Risk Categorization: High Risk  Patient has one or more of the following: Loss of protective sensation Absent pedal pulses Severe Foot deformity History of foot ulcer  Plan: -Diabetic foot examination performed today. -Ordered noninvasive arterial studies segmentals and ABIs for b/l lower extremities. -Wandra Feinstein at [facility] to continue pressure precautions due to bedbound status. -Discussed diminished pedal pulses with daughter today. He has no wounds nor tissue loss. Last studies performed June, 2019. He has seen Dr. Ruta Hinds in the past. -Diabetic foot examination performed today. -Mycotic toenails 1-5 bilaterally were debrided in length and girth with sterile nail nippers and dremel without incident. -Patient/POA to call should there be question/concern in the interim.  Return in about 3  months (around 03/17/2021).  Marzetta Board, DPM

## 2021-01-15 ENCOUNTER — Ambulatory Visit: Payer: Medicare Other | Admitting: Adult Health

## 2021-01-29 DIAGNOSIS — R0602 Shortness of breath: Secondary | ICD-10-CM | POA: Diagnosis not present

## 2021-01-29 DIAGNOSIS — R2231 Localized swelling, mass and lump, right upper limb: Secondary | ICD-10-CM | POA: Diagnosis not present

## 2021-02-06 DIAGNOSIS — I2510 Atherosclerotic heart disease of native coronary artery without angina pectoris: Secondary | ICD-10-CM | POA: Diagnosis not present

## 2021-02-06 DIAGNOSIS — R269 Unspecified abnormalities of gait and mobility: Secondary | ICD-10-CM | POA: Diagnosis not present

## 2021-02-06 DIAGNOSIS — N183 Chronic kidney disease, stage 3 unspecified: Secondary | ICD-10-CM | POA: Diagnosis not present

## 2021-02-06 DIAGNOSIS — I69398 Other sequelae of cerebral infarction: Secondary | ICD-10-CM | POA: Diagnosis not present

## 2021-02-06 DIAGNOSIS — R569 Unspecified convulsions: Secondary | ICD-10-CM | POA: Diagnosis not present

## 2021-02-12 DIAGNOSIS — E559 Vitamin D deficiency, unspecified: Secondary | ICD-10-CM | POA: Diagnosis not present

## 2021-02-15 DIAGNOSIS — Z79899 Other long term (current) drug therapy: Secondary | ICD-10-CM | POA: Diagnosis not present

## 2021-02-19 DIAGNOSIS — F01C3 Vascular dementia, severe, with mood disturbance: Secondary | ICD-10-CM | POA: Diagnosis not present

## 2021-02-19 DIAGNOSIS — I69398 Other sequelae of cerebral infarction: Secondary | ICD-10-CM | POA: Diagnosis not present

## 2021-02-21 DIAGNOSIS — F32A Depression, unspecified: Secondary | ICD-10-CM | POA: Diagnosis not present

## 2021-02-21 DIAGNOSIS — N183 Chronic kidney disease, stage 3 unspecified: Secondary | ICD-10-CM | POA: Diagnosis not present

## 2021-02-21 DIAGNOSIS — I2510 Atherosclerotic heart disease of native coronary artery without angina pectoris: Secondary | ICD-10-CM | POA: Diagnosis not present

## 2021-02-21 DIAGNOSIS — R1319 Other dysphagia: Secondary | ICD-10-CM | POA: Diagnosis not present

## 2021-02-21 DIAGNOSIS — E559 Vitamin D deficiency, unspecified: Secondary | ICD-10-CM | POA: Diagnosis not present

## 2021-02-21 DIAGNOSIS — I480 Paroxysmal atrial fibrillation: Secondary | ICD-10-CM | POA: Diagnosis not present

## 2021-02-21 DIAGNOSIS — R5381 Other malaise: Secondary | ICD-10-CM | POA: Diagnosis not present

## 2021-02-22 DIAGNOSIS — I129 Hypertensive chronic kidney disease with stage 1 through stage 4 chronic kidney disease, or unspecified chronic kidney disease: Secondary | ICD-10-CM | POA: Diagnosis not present

## 2021-02-22 DIAGNOSIS — E1140 Type 2 diabetes mellitus with diabetic neuropathy, unspecified: Secondary | ICD-10-CM | POA: Diagnosis not present

## 2021-02-22 DIAGNOSIS — N183 Chronic kidney disease, stage 3 unspecified: Secondary | ICD-10-CM | POA: Diagnosis not present

## 2021-02-22 DIAGNOSIS — I2510 Atherosclerotic heart disease of native coronary artery without angina pectoris: Secondary | ICD-10-CM | POA: Diagnosis not present

## 2021-02-22 DIAGNOSIS — F32A Depression, unspecified: Secondary | ICD-10-CM | POA: Diagnosis not present

## 2021-02-22 DIAGNOSIS — E559 Vitamin D deficiency, unspecified: Secondary | ICD-10-CM | POA: Diagnosis not present

## 2021-02-22 DIAGNOSIS — E1151 Type 2 diabetes mellitus with diabetic peripheral angiopathy without gangrene: Secondary | ICD-10-CM | POA: Diagnosis not present

## 2021-02-22 DIAGNOSIS — R5381 Other malaise: Secondary | ICD-10-CM | POA: Diagnosis not present

## 2021-02-22 DIAGNOSIS — I480 Paroxysmal atrial fibrillation: Secondary | ICD-10-CM | POA: Diagnosis not present

## 2021-02-22 DIAGNOSIS — R1319 Other dysphagia: Secondary | ICD-10-CM | POA: Diagnosis not present

## 2021-02-26 DIAGNOSIS — D649 Anemia, unspecified: Secondary | ICD-10-CM | POA: Diagnosis not present

## 2021-02-26 DIAGNOSIS — N183 Chronic kidney disease, stage 3 unspecified: Secondary | ICD-10-CM | POA: Diagnosis not present

## 2021-02-26 DIAGNOSIS — E1151 Type 2 diabetes mellitus with diabetic peripheral angiopathy without gangrene: Secondary | ICD-10-CM | POA: Diagnosis not present

## 2021-02-26 DIAGNOSIS — E785 Hyperlipidemia, unspecified: Secondary | ICD-10-CM | POA: Diagnosis not present

## 2021-02-26 DIAGNOSIS — I152 Hypertension secondary to endocrine disorders: Secondary | ICD-10-CM | POA: Diagnosis not present

## 2021-02-26 DIAGNOSIS — I480 Paroxysmal atrial fibrillation: Secondary | ICD-10-CM | POA: Diagnosis not present

## 2021-02-26 DIAGNOSIS — E1140 Type 2 diabetes mellitus with diabetic neuropathy, unspecified: Secondary | ICD-10-CM | POA: Diagnosis not present

## 2021-02-26 DIAGNOSIS — I10 Essential (primary) hypertension: Secondary | ICD-10-CM | POA: Diagnosis not present

## 2021-02-26 DIAGNOSIS — I2510 Atherosclerotic heart disease of native coronary artery without angina pectoris: Secondary | ICD-10-CM | POA: Diagnosis not present

## 2021-02-26 DIAGNOSIS — E1159 Type 2 diabetes mellitus with other circulatory complications: Secondary | ICD-10-CM | POA: Diagnosis not present

## 2021-02-26 DIAGNOSIS — E559 Vitamin D deficiency, unspecified: Secondary | ICD-10-CM | POA: Diagnosis not present

## 2021-03-07 ENCOUNTER — Ambulatory Visit: Payer: Medicare Other | Admitting: Adult Health

## 2021-03-08 DIAGNOSIS — R051 Acute cough: Secondary | ICD-10-CM | POA: Diagnosis not present

## 2021-03-15 DIAGNOSIS — E785 Hyperlipidemia, unspecified: Secondary | ICD-10-CM | POA: Diagnosis not present

## 2021-03-15 DIAGNOSIS — E559 Vitamin D deficiency, unspecified: Secondary | ICD-10-CM | POA: Diagnosis not present

## 2021-03-15 DIAGNOSIS — E1159 Type 2 diabetes mellitus with other circulatory complications: Secondary | ICD-10-CM | POA: Diagnosis not present

## 2021-03-15 DIAGNOSIS — N183 Chronic kidney disease, stage 3 unspecified: Secondary | ICD-10-CM | POA: Diagnosis not present

## 2021-03-15 DIAGNOSIS — I10 Essential (primary) hypertension: Secondary | ICD-10-CM | POA: Diagnosis not present

## 2021-03-15 DIAGNOSIS — I152 Hypertension secondary to endocrine disorders: Secondary | ICD-10-CM | POA: Diagnosis not present

## 2021-03-15 DIAGNOSIS — D649 Anemia, unspecified: Secondary | ICD-10-CM | POA: Diagnosis not present

## 2021-03-15 DIAGNOSIS — I2510 Atherosclerotic heart disease of native coronary artery without angina pectoris: Secondary | ICD-10-CM | POA: Diagnosis not present

## 2021-03-15 DIAGNOSIS — E1151 Type 2 diabetes mellitus with diabetic peripheral angiopathy without gangrene: Secondary | ICD-10-CM | POA: Diagnosis not present

## 2021-03-15 DIAGNOSIS — E1140 Type 2 diabetes mellitus with diabetic neuropathy, unspecified: Secondary | ICD-10-CM | POA: Diagnosis not present

## 2021-03-15 DIAGNOSIS — I480 Paroxysmal atrial fibrillation: Secondary | ICD-10-CM | POA: Diagnosis not present

## 2021-03-19 DIAGNOSIS — G4701 Insomnia due to medical condition: Secondary | ICD-10-CM | POA: Diagnosis not present

## 2021-03-19 DIAGNOSIS — F01C3 Vascular dementia, severe, with mood disturbance: Secondary | ICD-10-CM | POA: Diagnosis not present

## 2021-03-19 DIAGNOSIS — I69398 Other sequelae of cerebral infarction: Secondary | ICD-10-CM | POA: Diagnosis not present

## 2021-03-22 DIAGNOSIS — I480 Paroxysmal atrial fibrillation: Secondary | ICD-10-CM | POA: Diagnosis not present

## 2021-03-22 DIAGNOSIS — E1140 Type 2 diabetes mellitus with diabetic neuropathy, unspecified: Secondary | ICD-10-CM | POA: Diagnosis not present

## 2021-03-22 DIAGNOSIS — J209 Acute bronchitis, unspecified: Secondary | ICD-10-CM | POA: Diagnosis not present

## 2021-03-22 DIAGNOSIS — R5381 Other malaise: Secondary | ICD-10-CM | POA: Diagnosis not present

## 2021-03-22 DIAGNOSIS — R569 Unspecified convulsions: Secondary | ICD-10-CM | POA: Diagnosis not present

## 2021-03-22 DIAGNOSIS — I2510 Atherosclerotic heart disease of native coronary artery without angina pectoris: Secondary | ICD-10-CM | POA: Diagnosis not present

## 2021-03-22 DIAGNOSIS — N183 Chronic kidney disease, stage 3 unspecified: Secondary | ICD-10-CM | POA: Diagnosis not present

## 2021-03-22 DIAGNOSIS — F32A Depression, unspecified: Secondary | ICD-10-CM | POA: Diagnosis not present

## 2021-03-22 DIAGNOSIS — K5900 Constipation, unspecified: Secondary | ICD-10-CM | POA: Diagnosis not present

## 2021-03-22 DIAGNOSIS — K219 Gastro-esophageal reflux disease without esophagitis: Secondary | ICD-10-CM | POA: Diagnosis not present

## 2021-03-22 DIAGNOSIS — E559 Vitamin D deficiency, unspecified: Secondary | ICD-10-CM | POA: Diagnosis not present

## 2021-03-23 DIAGNOSIS — R0989 Other specified symptoms and signs involving the circulatory and respiratory systems: Secondary | ICD-10-CM | POA: Diagnosis not present

## 2021-03-28 ENCOUNTER — Ambulatory Visit: Payer: Medicare Other | Admitting: Podiatry

## 2021-04-02 DIAGNOSIS — I129 Hypertensive chronic kidney disease with stage 1 through stage 4 chronic kidney disease, or unspecified chronic kidney disease: Secondary | ICD-10-CM | POA: Diagnosis not present

## 2021-04-02 DIAGNOSIS — G4701 Insomnia due to medical condition: Secondary | ICD-10-CM | POA: Diagnosis not present

## 2021-04-02 DIAGNOSIS — I2510 Atherosclerotic heart disease of native coronary artery without angina pectoris: Secondary | ICD-10-CM | POA: Diagnosis not present

## 2021-04-02 DIAGNOSIS — N183 Chronic kidney disease, stage 3 unspecified: Secondary | ICD-10-CM | POA: Diagnosis not present

## 2021-04-02 DIAGNOSIS — E1142 Type 2 diabetes mellitus with diabetic polyneuropathy: Secondary | ICD-10-CM | POA: Diagnosis not present

## 2021-04-02 DIAGNOSIS — I480 Paroxysmal atrial fibrillation: Secondary | ICD-10-CM | POA: Diagnosis not present

## 2021-04-02 DIAGNOSIS — E1140 Type 2 diabetes mellitus with diabetic neuropathy, unspecified: Secondary | ICD-10-CM | POA: Diagnosis not present

## 2021-04-02 DIAGNOSIS — K5900 Constipation, unspecified: Secondary | ICD-10-CM | POA: Diagnosis not present

## 2021-04-02 DIAGNOSIS — R1319 Other dysphagia: Secondary | ICD-10-CM | POA: Diagnosis not present

## 2021-04-02 DIAGNOSIS — R5381 Other malaise: Secondary | ICD-10-CM | POA: Diagnosis not present

## 2021-04-02 DIAGNOSIS — K219 Gastro-esophageal reflux disease without esophagitis: Secondary | ICD-10-CM | POA: Diagnosis not present

## 2021-04-03 DIAGNOSIS — E538 Deficiency of other specified B group vitamins: Secondary | ICD-10-CM | POA: Diagnosis not present

## 2021-04-03 DIAGNOSIS — E559 Vitamin D deficiency, unspecified: Secondary | ICD-10-CM | POA: Diagnosis not present

## 2021-04-03 DIAGNOSIS — D538 Other specified nutritional anemias: Secondary | ICD-10-CM | POA: Diagnosis not present

## 2021-04-03 DIAGNOSIS — E119 Type 2 diabetes mellitus without complications: Secondary | ICD-10-CM | POA: Diagnosis not present

## 2021-04-18 DIAGNOSIS — E1159 Type 2 diabetes mellitus with other circulatory complications: Secondary | ICD-10-CM | POA: Diagnosis not present

## 2021-04-18 DIAGNOSIS — E559 Vitamin D deficiency, unspecified: Secondary | ICD-10-CM | POA: Diagnosis not present

## 2021-04-18 DIAGNOSIS — E1140 Type 2 diabetes mellitus with diabetic neuropathy, unspecified: Secondary | ICD-10-CM | POA: Diagnosis not present

## 2021-04-18 DIAGNOSIS — E785 Hyperlipidemia, unspecified: Secondary | ICD-10-CM | POA: Diagnosis not present

## 2021-04-18 DIAGNOSIS — I10 Essential (primary) hypertension: Secondary | ICD-10-CM | POA: Diagnosis not present

## 2021-04-18 DIAGNOSIS — N183 Chronic kidney disease, stage 3 unspecified: Secondary | ICD-10-CM | POA: Diagnosis not present

## 2021-04-18 DIAGNOSIS — E1151 Type 2 diabetes mellitus with diabetic peripheral angiopathy without gangrene: Secondary | ICD-10-CM | POA: Diagnosis not present

## 2021-04-18 DIAGNOSIS — I2510 Atherosclerotic heart disease of native coronary artery without angina pectoris: Secondary | ICD-10-CM | POA: Diagnosis not present

## 2021-04-18 DIAGNOSIS — D649 Anemia, unspecified: Secondary | ICD-10-CM | POA: Diagnosis not present

## 2021-04-18 DIAGNOSIS — I152 Hypertension secondary to endocrine disorders: Secondary | ICD-10-CM | POA: Diagnosis not present

## 2021-04-18 DIAGNOSIS — I480 Paroxysmal atrial fibrillation: Secondary | ICD-10-CM | POA: Diagnosis not present

## 2021-04-19 DIAGNOSIS — E1142 Type 2 diabetes mellitus with diabetic polyneuropathy: Secondary | ICD-10-CM | POA: Diagnosis not present

## 2021-04-19 DIAGNOSIS — R5381 Other malaise: Secondary | ICD-10-CM | POA: Diagnosis not present

## 2021-04-19 DIAGNOSIS — I480 Paroxysmal atrial fibrillation: Secondary | ICD-10-CM | POA: Diagnosis not present

## 2021-04-19 DIAGNOSIS — E559 Vitamin D deficiency, unspecified: Secondary | ICD-10-CM | POA: Diagnosis not present

## 2021-04-19 DIAGNOSIS — I129 Hypertensive chronic kidney disease with stage 1 through stage 4 chronic kidney disease, or unspecified chronic kidney disease: Secondary | ICD-10-CM | POA: Diagnosis not present

## 2021-04-19 DIAGNOSIS — N183 Chronic kidney disease, stage 3 unspecified: Secondary | ICD-10-CM | POA: Diagnosis not present

## 2021-04-19 DIAGNOSIS — E1140 Type 2 diabetes mellitus with diabetic neuropathy, unspecified: Secondary | ICD-10-CM | POA: Diagnosis not present

## 2021-04-19 DIAGNOSIS — K219 Gastro-esophageal reflux disease without esophagitis: Secondary | ICD-10-CM | POA: Diagnosis not present

## 2021-04-19 DIAGNOSIS — I2510 Atherosclerotic heart disease of native coronary artery without angina pectoris: Secondary | ICD-10-CM | POA: Diagnosis not present

## 2021-04-19 DIAGNOSIS — E1151 Type 2 diabetes mellitus with diabetic peripheral angiopathy without gangrene: Secondary | ICD-10-CM | POA: Diagnosis not present

## 2021-04-19 DIAGNOSIS — F32A Depression, unspecified: Secondary | ICD-10-CM | POA: Diagnosis not present

## 2021-04-23 DIAGNOSIS — I69398 Other sequelae of cerebral infarction: Secondary | ICD-10-CM | POA: Diagnosis not present

## 2021-04-23 DIAGNOSIS — G4701 Insomnia due to medical condition: Secondary | ICD-10-CM | POA: Diagnosis not present

## 2021-04-23 DIAGNOSIS — F01C3 Vascular dementia, severe, with mood disturbance: Secondary | ICD-10-CM | POA: Diagnosis not present

## 2021-04-30 DIAGNOSIS — E1142 Type 2 diabetes mellitus with diabetic polyneuropathy: Secondary | ICD-10-CM | POA: Diagnosis not present

## 2021-04-30 DIAGNOSIS — N183 Chronic kidney disease, stage 3 unspecified: Secondary | ICD-10-CM | POA: Diagnosis not present

## 2021-04-30 DIAGNOSIS — K219 Gastro-esophageal reflux disease without esophagitis: Secondary | ICD-10-CM | POA: Diagnosis not present

## 2021-04-30 DIAGNOSIS — E785 Hyperlipidemia, unspecified: Secondary | ICD-10-CM | POA: Diagnosis not present

## 2021-04-30 DIAGNOSIS — I480 Paroxysmal atrial fibrillation: Secondary | ICD-10-CM | POA: Diagnosis not present

## 2021-04-30 DIAGNOSIS — I2510 Atherosclerotic heart disease of native coronary artery without angina pectoris: Secondary | ICD-10-CM | POA: Diagnosis not present

## 2021-04-30 DIAGNOSIS — I129 Hypertensive chronic kidney disease with stage 1 through stage 4 chronic kidney disease, or unspecified chronic kidney disease: Secondary | ICD-10-CM | POA: Diagnosis not present

## 2021-04-30 DIAGNOSIS — R5381 Other malaise: Secondary | ICD-10-CM | POA: Diagnosis not present

## 2021-04-30 DIAGNOSIS — R1312 Dysphagia, oropharyngeal phase: Secondary | ICD-10-CM | POA: Diagnosis not present

## 2021-04-30 DIAGNOSIS — M6281 Muscle weakness (generalized): Secondary | ICD-10-CM | POA: Diagnosis not present

## 2021-05-01 ENCOUNTER — Ambulatory Visit: Payer: Medicare Other | Admitting: Podiatry

## 2021-05-02 DIAGNOSIS — M6281 Muscle weakness (generalized): Secondary | ICD-10-CM | POA: Diagnosis not present

## 2021-05-02 DIAGNOSIS — R1312 Dysphagia, oropharyngeal phase: Secondary | ICD-10-CM | POA: Diagnosis not present

## 2021-05-02 DIAGNOSIS — I2510 Atherosclerotic heart disease of native coronary artery without angina pectoris: Secondary | ICD-10-CM | POA: Diagnosis not present

## 2021-05-03 DIAGNOSIS — R1312 Dysphagia, oropharyngeal phase: Secondary | ICD-10-CM | POA: Diagnosis not present

## 2021-05-03 DIAGNOSIS — I2510 Atherosclerotic heart disease of native coronary artery without angina pectoris: Secondary | ICD-10-CM | POA: Diagnosis not present

## 2021-05-03 DIAGNOSIS — M6281 Muscle weakness (generalized): Secondary | ICD-10-CM | POA: Diagnosis not present

## 2021-05-08 DIAGNOSIS — I152 Hypertension secondary to endocrine disorders: Secondary | ICD-10-CM | POA: Diagnosis not present

## 2021-05-08 DIAGNOSIS — M6281 Muscle weakness (generalized): Secondary | ICD-10-CM | POA: Diagnosis not present

## 2021-05-08 DIAGNOSIS — R1312 Dysphagia, oropharyngeal phase: Secondary | ICD-10-CM | POA: Diagnosis not present

## 2021-05-08 DIAGNOSIS — E1151 Type 2 diabetes mellitus with diabetic peripheral angiopathy without gangrene: Secondary | ICD-10-CM | POA: Diagnosis not present

## 2021-05-08 DIAGNOSIS — I480 Paroxysmal atrial fibrillation: Secondary | ICD-10-CM | POA: Diagnosis not present

## 2021-05-08 DIAGNOSIS — N183 Chronic kidney disease, stage 3 unspecified: Secondary | ICD-10-CM | POA: Diagnosis not present

## 2021-05-08 DIAGNOSIS — I2510 Atherosclerotic heart disease of native coronary artery without angina pectoris: Secondary | ICD-10-CM | POA: Diagnosis not present

## 2021-05-09 DIAGNOSIS — M6281 Muscle weakness (generalized): Secondary | ICD-10-CM | POA: Diagnosis not present

## 2021-05-09 DIAGNOSIS — I2510 Atherosclerotic heart disease of native coronary artery without angina pectoris: Secondary | ICD-10-CM | POA: Diagnosis not present

## 2021-05-09 DIAGNOSIS — R1312 Dysphagia, oropharyngeal phase: Secondary | ICD-10-CM | POA: Diagnosis not present

## 2021-05-10 DIAGNOSIS — R1312 Dysphagia, oropharyngeal phase: Secondary | ICD-10-CM | POA: Diagnosis not present

## 2021-05-10 DIAGNOSIS — M6281 Muscle weakness (generalized): Secondary | ICD-10-CM | POA: Diagnosis not present

## 2021-05-10 DIAGNOSIS — I2510 Atherosclerotic heart disease of native coronary artery without angina pectoris: Secondary | ICD-10-CM | POA: Diagnosis not present

## 2021-05-14 DIAGNOSIS — F01C3 Vascular dementia, severe, with mood disturbance: Secondary | ICD-10-CM | POA: Diagnosis not present

## 2021-05-14 DIAGNOSIS — R1312 Dysphagia, oropharyngeal phase: Secondary | ICD-10-CM | POA: Diagnosis not present

## 2021-05-14 DIAGNOSIS — G4701 Insomnia due to medical condition: Secondary | ICD-10-CM | POA: Diagnosis not present

## 2021-05-14 DIAGNOSIS — M6281 Muscle weakness (generalized): Secondary | ICD-10-CM | POA: Diagnosis not present

## 2021-05-14 DIAGNOSIS — I2510 Atherosclerotic heart disease of native coronary artery without angina pectoris: Secondary | ICD-10-CM | POA: Diagnosis not present

## 2021-05-14 DIAGNOSIS — K219 Gastro-esophageal reflux disease without esophagitis: Secondary | ICD-10-CM | POA: Diagnosis not present

## 2021-05-14 DIAGNOSIS — I69398 Other sequelae of cerebral infarction: Secondary | ICD-10-CM | POA: Diagnosis not present

## 2021-05-14 DIAGNOSIS — Z79899 Other long term (current) drug therapy: Secondary | ICD-10-CM | POA: Diagnosis not present

## 2021-05-14 DIAGNOSIS — R1310 Dysphagia, unspecified: Secondary | ICD-10-CM | POA: Diagnosis not present

## 2021-05-15 DIAGNOSIS — I129 Hypertensive chronic kidney disease with stage 1 through stage 4 chronic kidney disease, or unspecified chronic kidney disease: Secondary | ICD-10-CM | POA: Diagnosis not present

## 2021-05-15 DIAGNOSIS — E1140 Type 2 diabetes mellitus with diabetic neuropathy, unspecified: Secondary | ICD-10-CM | POA: Diagnosis not present

## 2021-05-15 DIAGNOSIS — I152 Hypertension secondary to endocrine disorders: Secondary | ICD-10-CM | POA: Diagnosis not present

## 2021-05-15 DIAGNOSIS — E559 Vitamin D deficiency, unspecified: Secondary | ICD-10-CM | POA: Diagnosis not present

## 2021-05-15 DIAGNOSIS — M6281 Muscle weakness (generalized): Secondary | ICD-10-CM | POA: Diagnosis not present

## 2021-05-15 DIAGNOSIS — I2510 Atherosclerotic heart disease of native coronary artery without angina pectoris: Secondary | ICD-10-CM | POA: Diagnosis not present

## 2021-05-15 DIAGNOSIS — E785 Hyperlipidemia, unspecified: Secondary | ICD-10-CM | POA: Diagnosis not present

## 2021-05-15 DIAGNOSIS — I10 Essential (primary) hypertension: Secondary | ICD-10-CM | POA: Diagnosis not present

## 2021-05-15 DIAGNOSIS — D649 Anemia, unspecified: Secondary | ICD-10-CM | POA: Diagnosis not present

## 2021-05-15 DIAGNOSIS — R1312 Dysphagia, oropharyngeal phase: Secondary | ICD-10-CM | POA: Diagnosis not present

## 2021-05-15 DIAGNOSIS — E1151 Type 2 diabetes mellitus with diabetic peripheral angiopathy without gangrene: Secondary | ICD-10-CM | POA: Diagnosis not present

## 2021-05-15 DIAGNOSIS — N183 Chronic kidney disease, stage 3 unspecified: Secondary | ICD-10-CM | POA: Diagnosis not present

## 2021-05-15 DIAGNOSIS — E118 Type 2 diabetes mellitus with unspecified complications: Secondary | ICD-10-CM | POA: Diagnosis not present

## 2021-05-15 DIAGNOSIS — I480 Paroxysmal atrial fibrillation: Secondary | ICD-10-CM | POA: Diagnosis not present

## 2021-05-15 DIAGNOSIS — E1159 Type 2 diabetes mellitus with other circulatory complications: Secondary | ICD-10-CM | POA: Diagnosis not present

## 2021-05-17 DIAGNOSIS — E118 Type 2 diabetes mellitus with unspecified complications: Secondary | ICD-10-CM | POA: Diagnosis not present

## 2021-05-18 DIAGNOSIS — M6281 Muscle weakness (generalized): Secondary | ICD-10-CM | POA: Diagnosis not present

## 2021-05-18 DIAGNOSIS — I2510 Atherosclerotic heart disease of native coronary artery without angina pectoris: Secondary | ICD-10-CM | POA: Diagnosis not present

## 2021-05-18 DIAGNOSIS — R1312 Dysphagia, oropharyngeal phase: Secondary | ICD-10-CM | POA: Diagnosis not present

## 2021-05-19 DIAGNOSIS — I2510 Atherosclerotic heart disease of native coronary artery without angina pectoris: Secondary | ICD-10-CM | POA: Diagnosis not present

## 2021-05-19 DIAGNOSIS — M6281 Muscle weakness (generalized): Secondary | ICD-10-CM | POA: Diagnosis not present

## 2021-05-19 DIAGNOSIS — R1312 Dysphagia, oropharyngeal phase: Secondary | ICD-10-CM | POA: Diagnosis not present

## 2021-05-21 DIAGNOSIS — M6281 Muscle weakness (generalized): Secondary | ICD-10-CM | POA: Diagnosis not present

## 2021-05-21 DIAGNOSIS — I2510 Atherosclerotic heart disease of native coronary artery without angina pectoris: Secondary | ICD-10-CM | POA: Diagnosis not present

## 2021-05-21 DIAGNOSIS — R1312 Dysphagia, oropharyngeal phase: Secondary | ICD-10-CM | POA: Diagnosis not present

## 2021-05-22 DIAGNOSIS — M6281 Muscle weakness (generalized): Secondary | ICD-10-CM | POA: Diagnosis not present

## 2021-05-22 DIAGNOSIS — R1312 Dysphagia, oropharyngeal phase: Secondary | ICD-10-CM | POA: Diagnosis not present

## 2021-05-22 DIAGNOSIS — I2510 Atherosclerotic heart disease of native coronary artery without angina pectoris: Secondary | ICD-10-CM | POA: Diagnosis not present

## 2021-05-23 DIAGNOSIS — I2510 Atherosclerotic heart disease of native coronary artery without angina pectoris: Secondary | ICD-10-CM | POA: Diagnosis not present

## 2021-05-23 DIAGNOSIS — M6281 Muscle weakness (generalized): Secondary | ICD-10-CM | POA: Diagnosis not present

## 2021-05-23 DIAGNOSIS — R1312 Dysphagia, oropharyngeal phase: Secondary | ICD-10-CM | POA: Diagnosis not present

## 2021-05-24 DIAGNOSIS — I2510 Atherosclerotic heart disease of native coronary artery without angina pectoris: Secondary | ICD-10-CM | POA: Diagnosis not present

## 2021-05-24 DIAGNOSIS — M6281 Muscle weakness (generalized): Secondary | ICD-10-CM | POA: Diagnosis not present

## 2021-05-24 DIAGNOSIS — R1312 Dysphagia, oropharyngeal phase: Secondary | ICD-10-CM | POA: Diagnosis not present

## 2021-05-28 DIAGNOSIS — N183 Chronic kidney disease, stage 3 unspecified: Secondary | ICD-10-CM | POA: Diagnosis not present

## 2021-05-28 DIAGNOSIS — E1159 Type 2 diabetes mellitus with other circulatory complications: Secondary | ICD-10-CM | POA: Diagnosis not present

## 2021-05-28 DIAGNOSIS — K5900 Constipation, unspecified: Secondary | ICD-10-CM | POA: Diagnosis not present

## 2021-05-28 DIAGNOSIS — E559 Vitamin D deficiency, unspecified: Secondary | ICD-10-CM | POA: Diagnosis not present

## 2021-05-28 DIAGNOSIS — K219 Gastro-esophageal reflux disease without esophagitis: Secondary | ICD-10-CM | POA: Diagnosis not present

## 2021-05-28 DIAGNOSIS — I2510 Atherosclerotic heart disease of native coronary artery without angina pectoris: Secondary | ICD-10-CM | POA: Diagnosis not present

## 2021-05-28 DIAGNOSIS — E1151 Type 2 diabetes mellitus with diabetic peripheral angiopathy without gangrene: Secondary | ICD-10-CM | POA: Diagnosis not present

## 2021-05-28 DIAGNOSIS — R1319 Other dysphagia: Secondary | ICD-10-CM | POA: Diagnosis not present

## 2021-05-28 DIAGNOSIS — R5381 Other malaise: Secondary | ICD-10-CM | POA: Diagnosis not present

## 2021-05-29 DIAGNOSIS — M6281 Muscle weakness (generalized): Secondary | ICD-10-CM | POA: Diagnosis not present

## 2021-05-29 DIAGNOSIS — I2510 Atherosclerotic heart disease of native coronary artery without angina pectoris: Secondary | ICD-10-CM | POA: Diagnosis not present

## 2021-05-29 DIAGNOSIS — R1312 Dysphagia, oropharyngeal phase: Secondary | ICD-10-CM | POA: Diagnosis not present

## 2021-05-30 DIAGNOSIS — M6281 Muscle weakness (generalized): Secondary | ICD-10-CM | POA: Diagnosis not present

## 2021-05-30 DIAGNOSIS — R1312 Dysphagia, oropharyngeal phase: Secondary | ICD-10-CM | POA: Diagnosis not present

## 2021-05-30 DIAGNOSIS — I2510 Atherosclerotic heart disease of native coronary artery without angina pectoris: Secondary | ICD-10-CM | POA: Diagnosis not present

## 2021-05-31 DIAGNOSIS — R1312 Dysphagia, oropharyngeal phase: Secondary | ICD-10-CM | POA: Diagnosis not present

## 2021-05-31 DIAGNOSIS — I2510 Atherosclerotic heart disease of native coronary artery without angina pectoris: Secondary | ICD-10-CM | POA: Diagnosis not present

## 2021-05-31 DIAGNOSIS — M6281 Muscle weakness (generalized): Secondary | ICD-10-CM | POA: Diagnosis not present

## 2021-06-01 DIAGNOSIS — I2510 Atherosclerotic heart disease of native coronary artery without angina pectoris: Secondary | ICD-10-CM | POA: Diagnosis not present

## 2021-06-01 DIAGNOSIS — R1312 Dysphagia, oropharyngeal phase: Secondary | ICD-10-CM | POA: Diagnosis not present

## 2021-06-01 DIAGNOSIS — M6281 Muscle weakness (generalized): Secondary | ICD-10-CM | POA: Diagnosis not present

## 2021-06-04 DIAGNOSIS — R1312 Dysphagia, oropharyngeal phase: Secondary | ICD-10-CM | POA: Diagnosis not present

## 2021-06-04 DIAGNOSIS — F01C3 Vascular dementia, severe, with mood disturbance: Secondary | ICD-10-CM | POA: Diagnosis not present

## 2021-06-04 DIAGNOSIS — I69398 Other sequelae of cerebral infarction: Secondary | ICD-10-CM | POA: Diagnosis not present

## 2021-06-04 DIAGNOSIS — M6281 Muscle weakness (generalized): Secondary | ICD-10-CM | POA: Diagnosis not present

## 2021-06-04 DIAGNOSIS — I2510 Atherosclerotic heart disease of native coronary artery without angina pectoris: Secondary | ICD-10-CM | POA: Diagnosis not present

## 2021-06-04 DIAGNOSIS — G4701 Insomnia due to medical condition: Secondary | ICD-10-CM | POA: Diagnosis not present

## 2021-06-05 ENCOUNTER — Ambulatory Visit: Payer: Medicare Other | Admitting: Podiatry

## 2021-06-06 DIAGNOSIS — I2510 Atherosclerotic heart disease of native coronary artery without angina pectoris: Secondary | ICD-10-CM | POA: Diagnosis not present

## 2021-06-06 DIAGNOSIS — R1312 Dysphagia, oropharyngeal phase: Secondary | ICD-10-CM | POA: Diagnosis not present

## 2021-06-06 DIAGNOSIS — M6281 Muscle weakness (generalized): Secondary | ICD-10-CM | POA: Diagnosis not present

## 2021-06-07 DIAGNOSIS — E559 Vitamin D deficiency, unspecified: Secondary | ICD-10-CM | POA: Diagnosis not present

## 2021-06-07 DIAGNOSIS — K219 Gastro-esophageal reflux disease without esophagitis: Secondary | ICD-10-CM | POA: Diagnosis not present

## 2021-06-07 DIAGNOSIS — N183 Chronic kidney disease, stage 3 unspecified: Secondary | ICD-10-CM | POA: Diagnosis not present

## 2021-06-07 DIAGNOSIS — I152 Hypertension secondary to endocrine disorders: Secondary | ICD-10-CM | POA: Diagnosis not present

## 2021-06-07 DIAGNOSIS — R5381 Other malaise: Secondary | ICD-10-CM | POA: Diagnosis not present

## 2021-06-07 DIAGNOSIS — E785 Hyperlipidemia, unspecified: Secondary | ICD-10-CM | POA: Diagnosis not present

## 2021-06-07 DIAGNOSIS — I480 Paroxysmal atrial fibrillation: Secondary | ICD-10-CM | POA: Diagnosis not present

## 2021-06-07 DIAGNOSIS — E1159 Type 2 diabetes mellitus with other circulatory complications: Secondary | ICD-10-CM | POA: Diagnosis not present

## 2021-06-07 DIAGNOSIS — I2510 Atherosclerotic heart disease of native coronary artery without angina pectoris: Secondary | ICD-10-CM | POA: Diagnosis not present

## 2021-06-11 DIAGNOSIS — E1140 Type 2 diabetes mellitus with diabetic neuropathy, unspecified: Secondary | ICD-10-CM | POA: Diagnosis not present

## 2021-06-11 DIAGNOSIS — N183 Chronic kidney disease, stage 3 unspecified: Secondary | ICD-10-CM | POA: Diagnosis not present

## 2021-06-11 DIAGNOSIS — E1159 Type 2 diabetes mellitus with other circulatory complications: Secondary | ICD-10-CM | POA: Diagnosis not present

## 2021-06-11 DIAGNOSIS — I129 Hypertensive chronic kidney disease with stage 1 through stage 4 chronic kidney disease, or unspecified chronic kidney disease: Secondary | ICD-10-CM | POA: Diagnosis not present

## 2021-06-11 DIAGNOSIS — I2510 Atherosclerotic heart disease of native coronary artery without angina pectoris: Secondary | ICD-10-CM | POA: Diagnosis not present

## 2021-06-11 DIAGNOSIS — E559 Vitamin D deficiency, unspecified: Secondary | ICD-10-CM | POA: Diagnosis not present

## 2021-06-11 DIAGNOSIS — D649 Anemia, unspecified: Secondary | ICD-10-CM | POA: Diagnosis not present

## 2021-06-11 DIAGNOSIS — I480 Paroxysmal atrial fibrillation: Secondary | ICD-10-CM | POA: Diagnosis not present

## 2021-06-11 DIAGNOSIS — I10 Essential (primary) hypertension: Secondary | ICD-10-CM | POA: Diagnosis not present

## 2021-06-11 DIAGNOSIS — E1151 Type 2 diabetes mellitus with diabetic peripheral angiopathy without gangrene: Secondary | ICD-10-CM | POA: Diagnosis not present

## 2021-06-11 DIAGNOSIS — E785 Hyperlipidemia, unspecified: Secondary | ICD-10-CM | POA: Diagnosis not present

## 2021-06-12 DIAGNOSIS — E118 Type 2 diabetes mellitus with unspecified complications: Secondary | ICD-10-CM | POA: Diagnosis not present

## 2021-06-12 DIAGNOSIS — I10 Essential (primary) hypertension: Secondary | ICD-10-CM | POA: Diagnosis not present

## 2021-06-13 DIAGNOSIS — M6281 Muscle weakness (generalized): Secondary | ICD-10-CM | POA: Diagnosis not present

## 2021-06-13 DIAGNOSIS — R1312 Dysphagia, oropharyngeal phase: Secondary | ICD-10-CM | POA: Diagnosis not present

## 2021-06-13 DIAGNOSIS — I2510 Atherosclerotic heart disease of native coronary artery without angina pectoris: Secondary | ICD-10-CM | POA: Diagnosis not present

## 2021-06-14 DIAGNOSIS — R296 Repeated falls: Secondary | ICD-10-CM | POA: Diagnosis not present

## 2021-06-18 DIAGNOSIS — I2510 Atherosclerotic heart disease of native coronary artery without angina pectoris: Secondary | ICD-10-CM | POA: Diagnosis not present

## 2021-06-18 DIAGNOSIS — M6281 Muscle weakness (generalized): Secondary | ICD-10-CM | POA: Diagnosis not present

## 2021-06-18 DIAGNOSIS — R1312 Dysphagia, oropharyngeal phase: Secondary | ICD-10-CM | POA: Diagnosis not present

## 2021-06-20 DIAGNOSIS — R1312 Dysphagia, oropharyngeal phase: Secondary | ICD-10-CM | POA: Diagnosis not present

## 2021-06-20 DIAGNOSIS — I2510 Atherosclerotic heart disease of native coronary artery without angina pectoris: Secondary | ICD-10-CM | POA: Diagnosis not present

## 2021-06-20 DIAGNOSIS — M6281 Muscle weakness (generalized): Secondary | ICD-10-CM | POA: Diagnosis not present

## 2021-06-22 DIAGNOSIS — N390 Urinary tract infection, site not specified: Secondary | ICD-10-CM | POA: Diagnosis not present

## 2021-06-22 DIAGNOSIS — M6281 Muscle weakness (generalized): Secondary | ICD-10-CM | POA: Diagnosis not present

## 2021-06-22 DIAGNOSIS — I2510 Atherosclerotic heart disease of native coronary artery without angina pectoris: Secondary | ICD-10-CM | POA: Diagnosis not present

## 2021-06-22 DIAGNOSIS — R1312 Dysphagia, oropharyngeal phase: Secondary | ICD-10-CM | POA: Diagnosis not present

## 2021-06-23 DIAGNOSIS — N390 Urinary tract infection, site not specified: Secondary | ICD-10-CM | POA: Diagnosis not present

## 2021-06-25 DIAGNOSIS — M6281 Muscle weakness (generalized): Secondary | ICD-10-CM | POA: Diagnosis not present

## 2021-06-25 DIAGNOSIS — I2510 Atherosclerotic heart disease of native coronary artery without angina pectoris: Secondary | ICD-10-CM | POA: Diagnosis not present

## 2021-06-25 DIAGNOSIS — R1312 Dysphagia, oropharyngeal phase: Secondary | ICD-10-CM | POA: Diagnosis not present

## 2021-06-26 ENCOUNTER — Encounter (HOSPITAL_COMMUNITY): Payer: Self-pay

## 2021-06-26 ENCOUNTER — Other Ambulatory Visit: Payer: Self-pay

## 2021-06-26 ENCOUNTER — Emergency Department (HOSPITAL_COMMUNITY)
Admission: EM | Admit: 2021-06-26 | Discharge: 2021-06-27 | Disposition: A | Discharge status: Skilled Nursing Facility | Payer: Medicare Other | Attending: Emergency Medicine | Admitting: Emergency Medicine

## 2021-06-26 ENCOUNTER — Emergency Department (HOSPITAL_COMMUNITY): Payer: Medicare Other

## 2021-06-26 DIAGNOSIS — M47812 Spondylosis without myelopathy or radiculopathy, cervical region: Secondary | ICD-10-CM | POA: Diagnosis not present

## 2021-06-26 DIAGNOSIS — E785 Hyperlipidemia, unspecified: Secondary | ICD-10-CM | POA: Diagnosis not present

## 2021-06-26 DIAGNOSIS — I6523 Occlusion and stenosis of bilateral carotid arteries: Secondary | ICD-10-CM | POA: Diagnosis not present

## 2021-06-26 DIAGNOSIS — M79601 Pain in right arm: Secondary | ICD-10-CM | POA: Diagnosis not present

## 2021-06-26 DIAGNOSIS — W06XXXA Fall from bed, initial encounter: Secondary | ICD-10-CM | POA: Diagnosis not present

## 2021-06-26 DIAGNOSIS — R519 Headache, unspecified: Secondary | ICD-10-CM | POA: Diagnosis not present

## 2021-06-26 DIAGNOSIS — F0390 Unspecified dementia without behavioral disturbance: Secondary | ICD-10-CM | POA: Insufficient documentation

## 2021-06-26 DIAGNOSIS — I129 Hypertensive chronic kidney disease with stage 1 through stage 4 chronic kidney disease, or unspecified chronic kidney disease: Secondary | ICD-10-CM | POA: Insufficient documentation

## 2021-06-26 DIAGNOSIS — Y92122 Bedroom in nursing home as the place of occurrence of the external cause: Secondary | ICD-10-CM | POA: Insufficient documentation

## 2021-06-26 DIAGNOSIS — E1151 Type 2 diabetes mellitus with diabetic peripheral angiopathy without gangrene: Secondary | ICD-10-CM | POA: Diagnosis not present

## 2021-06-26 DIAGNOSIS — I10 Essential (primary) hypertension: Secondary | ICD-10-CM | POA: Diagnosis not present

## 2021-06-26 DIAGNOSIS — Z043 Encounter for examination and observation following other accident: Secondary | ICD-10-CM | POA: Diagnosis not present

## 2021-06-26 DIAGNOSIS — N183 Chronic kidney disease, stage 3 unspecified: Secondary | ICD-10-CM | POA: Insufficient documentation

## 2021-06-26 DIAGNOSIS — I672 Cerebral atherosclerosis: Secondary | ICD-10-CM | POA: Diagnosis not present

## 2021-06-26 DIAGNOSIS — I152 Hypertension secondary to endocrine disorders: Secondary | ICD-10-CM | POA: Diagnosis not present

## 2021-06-26 DIAGNOSIS — R296 Repeated falls: Secondary | ICD-10-CM | POA: Diagnosis not present

## 2021-06-26 DIAGNOSIS — T148XXA Other injury of unspecified body region, initial encounter: Secondary | ICD-10-CM | POA: Diagnosis not present

## 2021-06-26 DIAGNOSIS — I639 Cerebral infarction, unspecified: Secondary | ICD-10-CM | POA: Diagnosis not present

## 2021-06-26 DIAGNOSIS — S0990XA Unspecified injury of head, initial encounter: Secondary | ICD-10-CM | POA: Insufficient documentation

## 2021-06-26 DIAGNOSIS — D72829 Elevated white blood cell count, unspecified: Secondary | ICD-10-CM | POA: Insufficient documentation

## 2021-06-26 DIAGNOSIS — S065X0A Traumatic subdural hemorrhage without loss of consciousness, initial encounter: Secondary | ICD-10-CM | POA: Diagnosis not present

## 2021-06-26 DIAGNOSIS — I480 Paroxysmal atrial fibrillation: Secondary | ICD-10-CM | POA: Diagnosis not present

## 2021-06-26 DIAGNOSIS — M4312 Spondylolisthesis, cervical region: Secondary | ICD-10-CM | POA: Diagnosis not present

## 2021-06-26 DIAGNOSIS — R9082 White matter disease, unspecified: Secondary | ICD-10-CM | POA: Diagnosis not present

## 2021-06-26 DIAGNOSIS — I739 Peripheral vascular disease, unspecified: Secondary | ICD-10-CM | POA: Diagnosis not present

## 2021-06-26 DIAGNOSIS — S066X0A Traumatic subarachnoid hemorrhage without loss of consciousness, initial encounter: Secondary | ICD-10-CM | POA: Diagnosis not present

## 2021-06-26 DIAGNOSIS — I2510 Atherosclerotic heart disease of native coronary artery without angina pectoris: Secondary | ICD-10-CM | POA: Diagnosis not present

## 2021-06-26 DIAGNOSIS — W19XXXA Unspecified fall, initial encounter: Secondary | ICD-10-CM | POA: Diagnosis not present

## 2021-06-26 DIAGNOSIS — R410 Disorientation, unspecified: Secondary | ICD-10-CM | POA: Diagnosis not present

## 2021-06-26 DIAGNOSIS — S199XXA Unspecified injury of neck, initial encounter: Secondary | ICD-10-CM | POA: Diagnosis not present

## 2021-06-26 LAB — COMPREHENSIVE METABOLIC PANEL
ALT: 38 U/L (ref 0–44)
AST: 33 U/L (ref 15–41)
Albumin: 3.6 g/dL (ref 3.5–5.0)
Alkaline Phosphatase: 131 U/L — ABNORMAL HIGH (ref 38–126)
Anion gap: 11 (ref 5–15)
BUN: 12 mg/dL (ref 8–23)
CO2: 20 mmol/L — ABNORMAL LOW (ref 22–32)
Calcium: 8.8 mg/dL — ABNORMAL LOW (ref 8.9–10.3)
Chloride: 108 mmol/L (ref 98–111)
Creatinine, Ser: 1.55 mg/dL — ABNORMAL HIGH (ref 0.61–1.24)
GFR, Estimated: 47 mL/min — ABNORMAL LOW (ref >60)
Glucose, Bld: 112 mg/dL — ABNORMAL HIGH (ref 70–99)
Potassium: 3.3 mmol/L — ABNORMAL LOW (ref 3.5–5.1)
Sodium: 139 mmol/L (ref 135–145)
Total Bilirubin: 0.5 mg/dL (ref 0.3–1.2)
Total Protein: 7.4 g/dL (ref 6.5–8.1)

## 2021-06-26 LAB — CBC WITH DIFFERENTIAL/PLATELET
Abs Immature Granulocytes: 0.06 10*3/uL (ref 0.00–0.07)
Basophils Absolute: 0 10*3/uL (ref 0.0–0.1)
Basophils Relative: 0 %
Eosinophils Absolute: 0.1 10*3/uL (ref 0.0–0.5)
Eosinophils Relative: 1 %
HCT: 41 % (ref 39.0–52.0)
Hemoglobin: 13 g/dL (ref 13.0–17.0)
Immature Granulocytes: 1 %
Lymphocytes Relative: 15 %
Lymphs Abs: 1.9 10*3/uL (ref 0.7–4.0)
MCH: 30.8 pg (ref 26.0–34.0)
MCHC: 31.7 g/dL (ref 30.0–36.0)
MCV: 97.2 fL (ref 80.0–100.0)
Monocytes Absolute: 1.1 10*3/uL — ABNORMAL HIGH (ref 0.1–1.0)
Monocytes Relative: 9 %
Neutro Abs: 9 10*3/uL — ABNORMAL HIGH (ref 1.7–7.7)
Neutrophils Relative %: 74 %
Platelets: 154 10*3/uL (ref 150–400)
RBC: 4.22 MIL/uL (ref 4.22–5.81)
RDW: 18.2 % — ABNORMAL HIGH (ref 11.5–15.5)
WBC: 12.2 10*3/uL — ABNORMAL HIGH (ref 4.0–10.5)
nRBC: 0.2 % (ref 0.0–0.2)

## 2021-06-26 NOTE — Progress Notes (Signed)
Orthopedic Tech Progress Note Patient Details:  [name] [birth year] [id number]  Patient ID: Brian Burns, male   DOB: Dec 02, 1945, 76 y.o.   MRN: [id number] Level 2 trauma not needed. Kanisha Duba L Amyriah Buras 06/26/2021, 11:13 PM

## 2021-06-26 NOTE — ED Triage Notes (Incomplete)
Patient from a snf and is reported to be at baseline Per EMS patient fell out of bed and on to the floor and is taking blood thinners

## 2021-06-26 NOTE — ED Triage Notes (Signed)
Patient is from a SNF ([facility]) who reports per EMS that he fell out of bed and is taking blood thinners. Per EMS patient is at baseline with a facial droop and aphasia. Patient per ems is normally alert and oriented at baseline. Per EMS patient having back and neck pain after the fall. EMS reports that they would like to have the patient evaluated due to the fall on thinners.

## 2021-06-26 NOTE — ED Notes (Signed)
..Trauma Response Nurse Documentation   KEYSHON STEIN is a 76 y.o. male arriving to [facility] ED via EMS  On Eliquis (apixaban) daily. Trauma was activated as a Level 2 by charge nurse based on the following trauma criteria Elderly patients > 65 with head trauma on anti-coagulation (excluding ASA). Trauma team at the bedside on patient arrival. Patient cleared for CT by Dr. Jacqulyn Bath. Patient to CT with team.   History   Past Medical History:  Diagnosis Date   AAA (abdominal aortic aneurysm) without rupture 06/2014   no change in last exam from 2015   Achalasia    Anxiety    Arthritis    knees,lower back   Atrial fibrillation (HCC)    Atrial flutter with rapid ventricular response (HCC) 01/16/2015   Bilateral wrist pain 09/09/2014   Bladder outlet obstruction 01/20/2015   Bradycardia 08/27/2016   CAD (coronary artery disease)    pt denies   Carotid artery disease (HCC)    Chronic back pain    Chronic kidney disease (CKD), stage III (moderate) (HCC)    Decreased visual acuity of left eye  12/02/2018   Depression    Dyspnea    reports today no pregoression   Erectile dysfunction    GERD (gastroesophageal reflux disease)    Headache    hx of   History of CVA (cerebrovascular accident)    2009  &  2013   History of Multiple lacunar infarcts (HCC) 01/31/2015   MRI Brain 01/31/2015: Widespread old lacunar infarcts, particularly right Basal ganglia, pons, and right cerebellum   HLD (hyperlipidemia)    Hypertension    Idiopathic pancreatitis 12/2014   acute   Knee pain, bilateral 04/09/2011   Malnutrition of moderate degree 01/25/2015   Memory impairment    mild dementia per PCP progress note    Morbid obesity (HCC) 12/03/2018   Other abnormalities of gait and mobility 12/03/2018   PVD (peripheral vascular disease) with claudication (HCC)    Urethral stricture 02/16/2013   Urge incontinence 12/08/2018     Past Surgical History:  Procedure Laterality Date   back injections      [facility]   BALLOON DILATION N/A 12/16/2018   Procedure: URETHRAL BALLOON DILATION;  Surgeon: Crist Fat, MD;  [facility];  Service: Urology;  Laterality: N/A;   BIOPSY  05/22/2020   Procedure: BIOPSY;  Surgeon: [name], MD;  Location: [facility];  Service: Gastroenterology;;   CARDIOVASCULAR STRESS TEST  08-09-2009   mild global hypokinesis,  no ischemia or scar/  ef 41%   CATARACT EXTRACTION W/ INTRAOCULAR LENS  IMPLANT, BILATERAL  april and may 2014   CYSTOSCOPY N/A 01/17/2017   Procedure: CYSTOSCOPY FLEXIBLE;  Surgeon: Crist Fat, MD;  [facility];  Service: Urology;  Laterality: N/A;   CYSTOSCOPY WITH RETROGRADE URETHROGRAM N/A 11/24/2014   Procedure: CYSTOSCOPY WITH RETROGRADE URETHROGRAM;  Surgeon: Crist Fat, MD;  Location: [facility];  Service: Urology;  Laterality: N/A;   CYSTOSCOPY WITH RETROGRADE URETHROGRAM N/A 12/16/2018   Procedure: CYSTOSCOPY WITH RETROGRADE URETHROGRAM;  Surgeon: Crist Fat, MD;  [facility];  Service: Urology;  Laterality: N/A;   CYSTOSCOPY WITH RETROGRADE URETHROGRAM N/A 12/16/2019   Procedure: CYSTOSCOPY WITH RETROGRADE, Direct Vision Internal Urethrotomy;  Surgeon: Crist Fat, MD;  [facility];  Service: Urology;  Laterality: N/A;   CYSTOSCOPY WITH URETHRAL DILATATION N/A 11/24/2014   Procedure: CYSTOSCOPY WITH URETHRAL DILATATION;  Surgeon: Crist Fat, MD;  Location: [facility];  Service: Urology;  Laterality: N/A;  BALLOON DILATION         ESOPHAGEAL DILATION  05/22/2020   Procedure: ESOPHAGEAL DILATION;  Surgeon: [name], MD;  Location: [facility];  Service: Gastroenterology;;   ESOPHAGOGASTRODUODENOSCOPY N/A 05/15/2014   Procedure: ESOPHAGOGASTRODUODENOSCOPY (EGD);  Surgeon: Carman Ching, MD;  Location: [facility];  Service: Endoscopy;  Laterality: N/A;   ESOPHAGOGASTRODUODENOSCOPY Left 02/02/2015    Procedure: ESOPHAGOGASTRODUODENOSCOPY (EGD);  Surgeon: Dorena Cookey, MD;  Location: [facility];  Service: Endoscopy;  Laterality: Left;   ESOPHAGOGASTRODUODENOSCOPY Left 03/16/2015   Procedure: ESOPHAGOGASTRODUODENOSCOPY (EGD);  Surgeon: Dorena Cookey, MD;  Location: [facility];  Service: Endoscopy;  Laterality: Left;   ESOPHAGOGASTRODUODENOSCOPY (EGD) WITH PROPOFOL N/A 05/22/2020   Procedure: ESOPHAGOGASTRODUODENOSCOPY (EGD) WITH PROPOFOL;  Surgeon: [name], MD;  Location: [facility];  Service: Gastroenterology;  Laterality: N/A;   FOREIGN BODY REMOVAL  05/22/2020   Procedure: FOREIGN BODY REMOVAL;  Surgeon: [name], MD;  Location: [facility];  Service: Gastroenterology;;   TRANSTHORACIC ECHOCARDIOGRAM  01-07-2012   mild to moderate dilated LV,  ef 45-50%,  mild basal hypokinesis,  grade I diastolic  dysfunction/  trivial AR/  mild MR   URETHROPLASTY N/A 01/17/2017   Procedure: URETHEROPLASTY BUCCAL GRAFT AND BUCCAL MUCOSA GRAFT HARVEST;  Surgeon: Crist Fat, MD;  [facility];  Service: Urology;  Laterality: N/A;       Initial Focused Assessment (If applicable, or please see trauma documentation):  See trauma documentation CT's Completed:   CT Head and CT C-Spine   Interventions:  See narrative Plan for disposition:  Other-Continue medical/trauma evaluation   Event Summary: Pt transported from [facility] by [facility] after from from bed. Pt c/o neck and back pain, facial droop and dysphasia at baseline. Last dose of Eliquis this am. No obvious trauma. Manual pressure WNL, ED staff establishing IV.     Bedside handoff with ED RN Hinda Kehr.    Shelli Portilla Dee  Trauma Response RN  Please call TRN at [phone] for further assistance.

## 2021-06-26 NOTE — ED Provider Notes (Signed)
Emergency Department Provider Note   I have reviewed the triage vital signs and the nursing notes.   HISTORY  Chief Complaint Fall   HPI Brian Burns is a 76 y.o. male with past history reviewed below presents to the emergency department for evaluation after mechanical fall with suspected head injury.  According to EMS the patient had an unwitnessed fall at his facility.  They were told by staff on scene the patient is at his mental status baseline with some history of dementia.  Patient denies any pain to me. History limited due to dementia.    Past Medical History:  Diagnosis Date   AAA (abdominal aortic aneurysm) without rupture 06/2014   no change in last exam from 2015   Achalasia    Anxiety    Arthritis    knees,lower back   Atrial fibrillation (HCC)    Atrial flutter with rapid ventricular response (HCC) 01/16/2015   Bilateral wrist pain 09/09/2014   Bladder outlet obstruction 01/20/2015   Bradycardia 08/27/2016   CAD (coronary artery disease)    pt denies   Carotid artery disease (HCC)    Chronic back pain    Chronic kidney disease (CKD), stage III (moderate) (HCC)    Decreased visual acuity of left eye  12/02/2018   Depression    Dyspnea    reports today no pregoression   Erectile dysfunction    GERD (gastroesophageal reflux disease)    Headache    hx of   History of CVA (cerebrovascular accident)    2009  &  2013   History of Multiple lacunar infarcts (HCC) 01/31/2015   MRI Brain 01/31/2015: Widespread old lacunar infarcts, particularly right Basal ganglia, pons, and right cerebellum   HLD (hyperlipidemia)    Hypertension    Idiopathic pancreatitis 12/2014   acute   Knee pain, bilateral 04/09/2011   Malnutrition of moderate degree 01/25/2015   Memory impairment    mild dementia per PCP progress note    Morbid obesity (HCC) 12/03/2018   Other abnormalities of gait and mobility 12/03/2018   PVD (peripheral vascular disease) with claudication (HCC)     Urethral stricture 02/16/2013   Urge incontinence 12/08/2018    Review of Systems  Constitutional: No fever/chills Cardiovascular: Denies chest pain. Respiratory: Denies shortness of breath. Gastrointestinal: No abdominal pain. Musculoskeletal: Negative for back pain. Skin: Negative for rash. Neurological: Negative for headaches, focal weakness or numbness.   ____________________________________________   PHYSICAL EXAM:  VITAL SIGNS: ED Triage Vitals [06/26/21 2305]  Enc Vitals Group     BP (!) 146/108     Pulse Rate 87     Resp      Temp      Temp src      SpO2 97 %   Constitutional: Alert with some confusion. No acute distress.  Eyes: Conjunctivae are normal. PERRL. Head: Atraumatic. Nose: No congestion/rhinnorhea. Mouth/Throat: Mucous membranes are moist.   Neck: No stridor.  No cervical spine tenderness to palpation. Cardiovascular: Normal rate, regular rhythm. Good peripheral circulation. Grossly normal heart sounds.   Respiratory: Normal respiratory effort.  No retractions. Lungs CTAB. Gastrointestinal: Soft and nontender. No distention.  Musculoskeletal: No lower extremity tenderness nor edema. No gross deformities of extremities. Neurologic: Patient with some right facial asymmetry and dysarthria. Normal Burns/sensation in the bilateral upper/lower extremities.  Skin:  Skin is warm, dry and intact. No rash noted.  ____________________________________________   LABS (all labs ordered are listed, but only abnormal results are displayed)  Labs Reviewed  CBC WITH DIFFERENTIAL/PLATELET - Abnormal; Notable for the following components:      Result Value   WBC 12.2 (*)    RDW 18.2 (*)    Neutro Abs 9.0 (*)    Monocytes Absolute 1.1 (*)    All other components within normal limits  COMPREHENSIVE METABOLIC PANEL   ____________________________________________  EKG   EKG Interpretation  Date/Time:  Tuesday Jun 26 2021 23:16:33 EDT Ventricular Rate:   84 PR Interval:  175 QRS Duration: 93 QT Interval:  355 QTC Calculation: 420 R Axis:   108 Text Interpretation: Sinus rhythm Right axis deviation Borderline T wave abnormalities  Similar to April 2022 tracing Confirmed by Alona Bene [phone]) on 06/26/2021 11:20:29 PM        ____________________________________________  RADIOLOGY  No results found.  ____________________________________________   PROCEDURES  Procedure(s) performed:   Procedures   ____________________________________________   INITIAL IMPRESSION / ASSESSMENT AND PLAN / ED COURSE  Pertinent labs & imaging results that were available during my care of the patient were reviewed by me and considered in my medical decision making (see chart for details).   This patient is Presenting for Evaluation of head injury, which does require a range of treatment options, and is a complaint that involves a high risk of morbidity and mortality.  The Differential Diagnoses  includes subdural hematoma, epidural hematoma, acute concussion, traumatic subarachnoid hemorrhage, cerebral contusions, etc.   Critical Interventions-    Medications - No data to display  Reassessment after intervention:     I did obtain Additional Historical Information from EMS, as the patient is living with dementia.  I decided to review pertinent External Data, and in summary patient last admitted in April of this year with right face droop and dysarthria. Sounds from documentation as if these deficits resolved but per EMS these were reported to them as "baseline." No clear LSN.    Clinical Laboratory Tests Ordered, included ***  Radiologic Tests Ordered, included ***. I independently interpreted the images and agree with radiology interpretation.   Cardiac Monitor Tracing which shows NSR.   Social Determinants of Health Risk patient with a smoking history.   Consult complete with  Medical Decision Making: Summary:  Patient presents  emergency department after fall.  He has some right face droop with dysarthria which has been described in prior notes although unclear if this is a lingering deficit or recrudescence of old stroke infarct territory.  No clear last normal and patient is anticoagulated.  Did not activate code stroke in this instance.   Reevaluation with update and discussion with   ***Considered admission***  Disposition:   ____________________________________________  FINAL CLINICAL IMPRESSION(S) / ED DIAGNOSES  Final diagnoses:  None     NEW OUTPATIENT MEDICATIONS STARTED DURING THIS VISIT:  New Prescriptions   No medications on file    Note:  This document was prepared using Dragon voice recognition software and may include unintentional dictation errors.  Alona Bene, MD, [facility] Emergency Medicine

## 2021-06-27 ENCOUNTER — Emergency Department (HOSPITAL_COMMUNITY): Payer: Medicare Other

## 2021-06-27 DIAGNOSIS — I6523 Occlusion and stenosis of bilateral carotid arteries: Secondary | ICD-10-CM | POA: Diagnosis not present

## 2021-06-27 DIAGNOSIS — S0990XA Unspecified injury of head, initial encounter: Secondary | ICD-10-CM | POA: Diagnosis not present

## 2021-06-27 DIAGNOSIS — I672 Cerebral atherosclerosis: Secondary | ICD-10-CM | POA: Diagnosis not present

## 2021-06-27 DIAGNOSIS — M2550 Pain in unspecified joint: Secondary | ICD-10-CM | POA: Diagnosis not present

## 2021-06-27 DIAGNOSIS — M4312 Spondylolisthesis, cervical region: Secondary | ICD-10-CM | POA: Diagnosis not present

## 2021-06-27 DIAGNOSIS — M47812 Spondylosis without myelopathy or radiculopathy, cervical region: Secondary | ICD-10-CM | POA: Diagnosis not present

## 2021-06-27 DIAGNOSIS — I739 Peripheral vascular disease, unspecified: Secondary | ICD-10-CM | POA: Diagnosis not present

## 2021-06-27 DIAGNOSIS — M6281 Muscle weakness (generalized): Secondary | ICD-10-CM | POA: Diagnosis not present

## 2021-06-27 DIAGNOSIS — I639 Cerebral infarction, unspecified: Secondary | ICD-10-CM | POA: Diagnosis not present

## 2021-06-27 DIAGNOSIS — R1312 Dysphagia, oropharyngeal phase: Secondary | ICD-10-CM | POA: Diagnosis not present

## 2021-06-27 DIAGNOSIS — R531 Weakness: Secondary | ICD-10-CM | POA: Diagnosis not present

## 2021-06-27 DIAGNOSIS — S065X0A Traumatic subdural hemorrhage without loss of consciousness, initial encounter: Secondary | ICD-10-CM | POA: Diagnosis not present

## 2021-06-27 DIAGNOSIS — R9082 White matter disease, unspecified: Secondary | ICD-10-CM | POA: Diagnosis not present

## 2021-06-27 DIAGNOSIS — S066X0A Traumatic subarachnoid hemorrhage without loss of consciousness, initial encounter: Secondary | ICD-10-CM | POA: Diagnosis not present

## 2021-06-27 DIAGNOSIS — S199XXA Unspecified injury of neck, initial encounter: Secondary | ICD-10-CM | POA: Diagnosis not present

## 2021-06-27 DIAGNOSIS — Z7401 Bed confinement status: Secondary | ICD-10-CM | POA: Diagnosis not present

## 2021-06-27 DIAGNOSIS — I2510 Atherosclerotic heart disease of native coronary artery without angina pectoris: Secondary | ICD-10-CM | POA: Diagnosis not present

## 2021-06-27 NOTE — ED Notes (Signed)
PTAR called at 8:53; less than an hour to wait. Informed RN.

## 2021-06-27 NOTE — ED Notes (Signed)
Attempted to call report to [place], nurse could not take reports at this time and is to call back

## 2021-06-27 NOTE — TOC CAGE-AID Note (Signed)
Transition of Care [facility]) - CAGE-AID Screening   Patient Details  Name: LEVAUGHN PUCCINELLI MRN: [id number] Date of Birth: [birth year]  Transition of Care [facility]) CM/SW Contact:    Miko Markwood C Tarpley-Carter, LCSWA Phone Number: 06/27/2021, 9:55 AM   Clinical Narrative: Pt participated in Cage-Aid.  Pt stated he does not use substance or ETOH.  Pt was not offered resources, due to no usage of substance or ETOH.     Nicolis Boody Tarpley-Carter, MSW, LCSW-A Pronouns:  She/Her/Hers [facility] HealthTransitions of Care Clinical Social Worker Direct Number:  [phone] [email or web address]  CAGE-AID Screening:    Have You Ever Felt You Ought to Cut Down on Your Drinking or Drug Use?: No Have People Annoyed You By [employer] Your Drinking Or Drug Use?: No Have You Felt Bad Or Guilty About Your Drinking Or Drug Use?: No Have You Ever Had a Drink or Used Drugs First Thing In The Morning to Steady Your Nerves or to Get Rid of a Hangover?: No CAGE-AID Score: 0  Substance Abuse Education Offered: No

## 2021-06-27 NOTE — Discharge Instructions (Signed)
Thankfully the CT scan was repeated this morning and showed no signs of bleeding, this is very good and I have discussed the case with the neurosurgeon who recommends that you can continue with your medications exactly as prescribed including the blood thinners.  Please be careful not to fall, if you have recurrent falls with head injuries you will need to come back to the emergency department immediately.  Thank you for allowing [place] to treat you in the emergency department today.  After reviewing your examination and potential testing that was done it appears that you are safe to go home.  I would like for you to follow-up with your doctor within the next several days, have them obtain your results and follow-up with them to review all of these tests.  If you should develop severe or worsening symptoms return to the emergency department immediately

## 2021-06-27 NOTE — ED Notes (Signed)
Brief changed due to incontinence of stool and urine.  Large BM.

## 2021-06-27 NOTE — ED Provider Notes (Signed)
At change of shift care was signed out from off going physician, patient has had a fall with suspected small subdural hematoma however the repeat CT scan this morning showed no evidence of any hemorrhage, I discussed the care with the neurosurgical team, Ms Brian Burns has discussed the case with the neurosurgeon Dr. Dutch Quint and they recommend that the patient can go home with no change in outpatient medication regimen including anticoagulants.   Eber Hong, MD 06/27/21 [phone]

## 2021-06-29 DIAGNOSIS — I2510 Atherosclerotic heart disease of native coronary artery without angina pectoris: Secondary | ICD-10-CM | POA: Diagnosis not present

## 2021-06-29 DIAGNOSIS — M6281 Muscle weakness (generalized): Secondary | ICD-10-CM | POA: Diagnosis not present

## 2021-07-04 DIAGNOSIS — I2510 Atherosclerotic heart disease of native coronary artery without angina pectoris: Secondary | ICD-10-CM | POA: Diagnosis not present

## 2021-07-04 DIAGNOSIS — M6281 Muscle weakness (generalized): Secondary | ICD-10-CM | POA: Diagnosis not present

## 2021-07-05 DIAGNOSIS — I152 Hypertension secondary to endocrine disorders: Secondary | ICD-10-CM | POA: Diagnosis not present

## 2021-07-05 DIAGNOSIS — E1151 Type 2 diabetes mellitus with diabetic peripheral angiopathy without gangrene: Secondary | ICD-10-CM | POA: Diagnosis not present

## 2021-07-05 DIAGNOSIS — N183 Chronic kidney disease, stage 3 unspecified: Secondary | ICD-10-CM | POA: Diagnosis not present

## 2021-07-05 DIAGNOSIS — M6281 Muscle weakness (generalized): Secondary | ICD-10-CM | POA: Diagnosis not present

## 2021-07-05 DIAGNOSIS — E785 Hyperlipidemia, unspecified: Secondary | ICD-10-CM | POA: Diagnosis not present

## 2021-07-05 DIAGNOSIS — I480 Paroxysmal atrial fibrillation: Secondary | ICD-10-CM | POA: Diagnosis not present

## 2021-07-05 DIAGNOSIS — I2510 Atherosclerotic heart disease of native coronary artery without angina pectoris: Secondary | ICD-10-CM | POA: Diagnosis not present

## 2021-07-06 DIAGNOSIS — M6281 Muscle weakness (generalized): Secondary | ICD-10-CM | POA: Diagnosis not present

## 2021-07-06 DIAGNOSIS — I2510 Atherosclerotic heart disease of native coronary artery without angina pectoris: Secondary | ICD-10-CM | POA: Diagnosis not present

## 2021-07-09 DIAGNOSIS — I2510 Atherosclerotic heart disease of native coronary artery without angina pectoris: Secondary | ICD-10-CM | POA: Diagnosis not present

## 2021-07-09 DIAGNOSIS — M6281 Muscle weakness (generalized): Secondary | ICD-10-CM | POA: Diagnosis not present

## 2021-07-10 DIAGNOSIS — I2510 Atherosclerotic heart disease of native coronary artery without angina pectoris: Secondary | ICD-10-CM | POA: Diagnosis not present

## 2021-07-10 DIAGNOSIS — M6281 Muscle weakness (generalized): Secondary | ICD-10-CM | POA: Diagnosis not present

## 2021-07-11 DIAGNOSIS — I480 Paroxysmal atrial fibrillation: Secondary | ICD-10-CM | POA: Diagnosis not present

## 2021-07-11 DIAGNOSIS — E782 Mixed hyperlipidemia: Secondary | ICD-10-CM | POA: Diagnosis not present

## 2021-07-11 DIAGNOSIS — E559 Vitamin D deficiency, unspecified: Secondary | ICD-10-CM | POA: Diagnosis not present

## 2021-07-11 DIAGNOSIS — N183 Chronic kidney disease, stage 3 unspecified: Secondary | ICD-10-CM | POA: Diagnosis not present

## 2021-07-11 DIAGNOSIS — I10 Essential (primary) hypertension: Secondary | ICD-10-CM | POA: Diagnosis not present

## 2021-07-13 DIAGNOSIS — M6281 Muscle weakness (generalized): Secondary | ICD-10-CM | POA: Diagnosis not present

## 2021-07-13 DIAGNOSIS — I10 Essential (primary) hypertension: Secondary | ICD-10-CM | POA: Diagnosis not present

## 2021-07-13 DIAGNOSIS — I2510 Atherosclerotic heart disease of native coronary artery without angina pectoris: Secondary | ICD-10-CM | POA: Diagnosis not present

## 2021-07-13 DIAGNOSIS — E118 Type 2 diabetes mellitus with unspecified complications: Secondary | ICD-10-CM | POA: Diagnosis not present

## 2021-07-23 DIAGNOSIS — I69398 Other sequelae of cerebral infarction: Secondary | ICD-10-CM | POA: Diagnosis not present

## 2021-07-23 DIAGNOSIS — F01C3 Vascular dementia, severe, with mood disturbance: Secondary | ICD-10-CM | POA: Diagnosis not present

## 2021-07-23 DIAGNOSIS — G4701 Insomnia due to medical condition: Secondary | ICD-10-CM | POA: Diagnosis not present

## 2021-07-24 DIAGNOSIS — E785 Hyperlipidemia, unspecified: Secondary | ICD-10-CM | POA: Diagnosis not present

## 2021-07-24 DIAGNOSIS — I10 Essential (primary) hypertension: Secondary | ICD-10-CM | POA: Diagnosis not present

## 2021-07-24 DIAGNOSIS — E1151 Type 2 diabetes mellitus with diabetic peripheral angiopathy without gangrene: Secondary | ICD-10-CM | POA: Diagnosis not present

## 2021-07-24 DIAGNOSIS — I480 Paroxysmal atrial fibrillation: Secondary | ICD-10-CM | POA: Diagnosis not present

## 2021-07-26 DIAGNOSIS — E119 Type 2 diabetes mellitus without complications: Secondary | ICD-10-CM | POA: Diagnosis not present

## 2021-07-26 DIAGNOSIS — E559 Vitamin D deficiency, unspecified: Secondary | ICD-10-CM | POA: Diagnosis not present

## 2021-08-09 DIAGNOSIS — I10 Essential (primary) hypertension: Secondary | ICD-10-CM | POA: Diagnosis not present

## 2021-08-09 DIAGNOSIS — E1151 Type 2 diabetes mellitus with diabetic peripheral angiopathy without gangrene: Secondary | ICD-10-CM | POA: Diagnosis not present

## 2021-08-09 DIAGNOSIS — E785 Hyperlipidemia, unspecified: Secondary | ICD-10-CM | POA: Diagnosis not present

## 2021-08-09 DIAGNOSIS — I480 Paroxysmal atrial fibrillation: Secondary | ICD-10-CM | POA: Diagnosis not present

## 2021-08-10 DIAGNOSIS — E559 Vitamin D deficiency, unspecified: Secondary | ICD-10-CM | POA: Diagnosis not present

## 2021-08-10 DIAGNOSIS — N183 Chronic kidney disease, stage 3 unspecified: Secondary | ICD-10-CM | POA: Diagnosis not present

## 2021-08-10 DIAGNOSIS — E782 Mixed hyperlipidemia: Secondary | ICD-10-CM | POA: Diagnosis not present

## 2021-08-10 DIAGNOSIS — I10 Essential (primary) hypertension: Secondary | ICD-10-CM | POA: Diagnosis not present

## 2021-08-12 DIAGNOSIS — N390 Urinary tract infection, site not specified: Secondary | ICD-10-CM | POA: Diagnosis not present

## 2021-08-14 DIAGNOSIS — E118 Type 2 diabetes mellitus with unspecified complications: Secondary | ICD-10-CM | POA: Diagnosis not present

## 2021-08-14 DIAGNOSIS — I10 Essential (primary) hypertension: Secondary | ICD-10-CM | POA: Diagnosis not present

## 2021-08-24 DIAGNOSIS — I2510 Atherosclerotic heart disease of native coronary artery without angina pectoris: Secondary | ICD-10-CM | POA: Diagnosis not present

## 2021-08-24 DIAGNOSIS — R5381 Other malaise: Secondary | ICD-10-CM | POA: Diagnosis not present

## 2021-08-24 DIAGNOSIS — K219 Gastro-esophageal reflux disease without esophagitis: Secondary | ICD-10-CM | POA: Diagnosis not present

## 2021-08-24 DIAGNOSIS — E1142 Type 2 diabetes mellitus with diabetic polyneuropathy: Secondary | ICD-10-CM | POA: Diagnosis not present

## 2021-08-24 DIAGNOSIS — N183 Chronic kidney disease, stage 3 unspecified: Secondary | ICD-10-CM | POA: Diagnosis not present

## 2021-08-24 DIAGNOSIS — I480 Paroxysmal atrial fibrillation: Secondary | ICD-10-CM | POA: Diagnosis not present

## 2021-08-27 DIAGNOSIS — F01C3 Vascular dementia, severe, with mood disturbance: Secondary | ICD-10-CM | POA: Diagnosis not present

## 2021-08-27 DIAGNOSIS — G4701 Insomnia due to medical condition: Secondary | ICD-10-CM | POA: Diagnosis not present

## 2021-08-27 DIAGNOSIS — I69398 Other sequelae of cerebral infarction: Secondary | ICD-10-CM | POA: Diagnosis not present

## 2021-09-06 DIAGNOSIS — E118 Type 2 diabetes mellitus with unspecified complications: Secondary | ICD-10-CM | POA: Diagnosis not present

## 2021-09-06 DIAGNOSIS — I10 Essential (primary) hypertension: Secondary | ICD-10-CM | POA: Diagnosis not present

## 2021-09-13 DIAGNOSIS — I10 Essential (primary) hypertension: Secondary | ICD-10-CM | POA: Diagnosis not present

## 2021-09-13 DIAGNOSIS — E1159 Type 2 diabetes mellitus with other circulatory complications: Secondary | ICD-10-CM | POA: Diagnosis not present

## 2021-09-13 DIAGNOSIS — R5381 Other malaise: Secondary | ICD-10-CM | POA: Diagnosis not present

## 2021-09-13 DIAGNOSIS — E1142 Type 2 diabetes mellitus with diabetic polyneuropathy: Secondary | ICD-10-CM | POA: Diagnosis not present

## 2021-09-13 DIAGNOSIS — I2510 Atherosclerotic heart disease of native coronary artery without angina pectoris: Secondary | ICD-10-CM | POA: Diagnosis not present

## 2021-09-13 DIAGNOSIS — N183 Chronic kidney disease, stage 3 unspecified: Secondary | ICD-10-CM | POA: Diagnosis not present

## 2021-09-13 DIAGNOSIS — I480 Paroxysmal atrial fibrillation: Secondary | ICD-10-CM | POA: Diagnosis not present

## 2021-09-13 DIAGNOSIS — E785 Hyperlipidemia, unspecified: Secondary | ICD-10-CM | POA: Diagnosis not present

## 2021-09-21 DIAGNOSIS — I10 Essential (primary) hypertension: Secondary | ICD-10-CM | POA: Diagnosis not present

## 2021-09-21 DIAGNOSIS — E559 Vitamin D deficiency, unspecified: Secondary | ICD-10-CM | POA: Diagnosis not present

## 2021-09-21 DIAGNOSIS — I480 Paroxysmal atrial fibrillation: Secondary | ICD-10-CM | POA: Diagnosis not present

## 2021-09-21 DIAGNOSIS — N183 Chronic kidney disease, stage 3 unspecified: Secondary | ICD-10-CM | POA: Diagnosis not present

## 2021-09-21 DIAGNOSIS — E782 Mixed hyperlipidemia: Secondary | ICD-10-CM | POA: Diagnosis not present

## 2021-09-24 DIAGNOSIS — F01C3 Vascular dementia, severe, with mood disturbance: Secondary | ICD-10-CM | POA: Diagnosis not present

## 2021-09-24 DIAGNOSIS — G4701 Insomnia due to medical condition: Secondary | ICD-10-CM | POA: Diagnosis not present

## 2021-09-24 DIAGNOSIS — I69398 Other sequelae of cerebral infarction: Secondary | ICD-10-CM | POA: Diagnosis not present

## 2021-09-26 DIAGNOSIS — E785 Hyperlipidemia, unspecified: Secondary | ICD-10-CM | POA: Diagnosis not present

## 2021-09-26 DIAGNOSIS — I2510 Atherosclerotic heart disease of native coronary artery without angina pectoris: Secondary | ICD-10-CM | POA: Diagnosis not present

## 2021-09-26 DIAGNOSIS — I480 Paroxysmal atrial fibrillation: Secondary | ICD-10-CM | POA: Diagnosis not present

## 2021-09-26 DIAGNOSIS — K219 Gastro-esophageal reflux disease without esophagitis: Secondary | ICD-10-CM | POA: Diagnosis not present

## 2021-09-26 DIAGNOSIS — E1142 Type 2 diabetes mellitus with diabetic polyneuropathy: Secondary | ICD-10-CM | POA: Diagnosis not present

## 2021-09-26 DIAGNOSIS — R5381 Other malaise: Secondary | ICD-10-CM | POA: Diagnosis not present

## 2021-09-26 DIAGNOSIS — I10 Essential (primary) hypertension: Secondary | ICD-10-CM | POA: Diagnosis not present

## 2021-09-26 DIAGNOSIS — N183 Chronic kidney disease, stage 3 unspecified: Secondary | ICD-10-CM | POA: Diagnosis not present

## 2021-10-03 DIAGNOSIS — I739 Peripheral vascular disease, unspecified: Secondary | ICD-10-CM | POA: Diagnosis not present

## 2021-10-03 DIAGNOSIS — B351 Tinea unguium: Secondary | ICD-10-CM | POA: Diagnosis not present

## 2021-10-04 DIAGNOSIS — E118 Type 2 diabetes mellitus with unspecified complications: Secondary | ICD-10-CM | POA: Diagnosis not present

## 2021-10-04 DIAGNOSIS — I10 Essential (primary) hypertension: Secondary | ICD-10-CM | POA: Diagnosis not present

## 2021-10-05 DIAGNOSIS — E119 Type 2 diabetes mellitus without complications: Secondary | ICD-10-CM | POA: Diagnosis not present

## 2021-10-05 DIAGNOSIS — E559 Vitamin D deficiency, unspecified: Secondary | ICD-10-CM | POA: Diagnosis not present

## 2021-10-11 DIAGNOSIS — K219 Gastro-esophageal reflux disease without esophagitis: Secondary | ICD-10-CM | POA: Diagnosis not present

## 2021-10-11 DIAGNOSIS — I10 Essential (primary) hypertension: Secondary | ICD-10-CM | POA: Diagnosis not present

## 2021-10-11 DIAGNOSIS — I2510 Atherosclerotic heart disease of native coronary artery without angina pectoris: Secondary | ICD-10-CM | POA: Diagnosis not present

## 2021-10-11 DIAGNOSIS — I480 Paroxysmal atrial fibrillation: Secondary | ICD-10-CM | POA: Diagnosis not present

## 2021-10-11 DIAGNOSIS — E785 Hyperlipidemia, unspecified: Secondary | ICD-10-CM | POA: Diagnosis not present

## 2021-10-11 DIAGNOSIS — R5381 Other malaise: Secondary | ICD-10-CM | POA: Diagnosis not present

## 2021-10-11 DIAGNOSIS — E1142 Type 2 diabetes mellitus with diabetic polyneuropathy: Secondary | ICD-10-CM | POA: Diagnosis not present

## 2021-10-11 DIAGNOSIS — N183 Chronic kidney disease, stage 3 unspecified: Secondary | ICD-10-CM | POA: Diagnosis not present

## 2021-10-15 DIAGNOSIS — I69398 Other sequelae of cerebral infarction: Secondary | ICD-10-CM | POA: Diagnosis not present

## 2021-10-15 DIAGNOSIS — G4701 Insomnia due to medical condition: Secondary | ICD-10-CM | POA: Diagnosis not present

## 2021-10-15 DIAGNOSIS — E559 Vitamin D deficiency, unspecified: Secondary | ICD-10-CM | POA: Diagnosis not present

## 2021-10-15 DIAGNOSIS — F01C3 Vascular dementia, severe, with mood disturbance: Secondary | ICD-10-CM | POA: Diagnosis not present

## 2021-10-15 DIAGNOSIS — I2510 Atherosclerotic heart disease of native coronary artery without angina pectoris: Secondary | ICD-10-CM | POA: Diagnosis not present

## 2021-10-15 DIAGNOSIS — N183 Chronic kidney disease, stage 3 unspecified: Secondary | ICD-10-CM | POA: Diagnosis not present

## 2021-10-15 DIAGNOSIS — E782 Mixed hyperlipidemia: Secondary | ICD-10-CM | POA: Diagnosis not present

## 2021-10-15 DIAGNOSIS — I10 Essential (primary) hypertension: Secondary | ICD-10-CM | POA: Diagnosis not present

## 2021-10-15 DIAGNOSIS — E1151 Type 2 diabetes mellitus with diabetic peripheral angiopathy without gangrene: Secondary | ICD-10-CM | POA: Diagnosis not present

## 2021-10-26 DIAGNOSIS — I2510 Atherosclerotic heart disease of native coronary artery without angina pectoris: Secondary | ICD-10-CM | POA: Diagnosis not present

## 2021-10-26 DIAGNOSIS — N183 Chronic kidney disease, stage 3 unspecified: Secondary | ICD-10-CM | POA: Diagnosis not present

## 2021-10-26 DIAGNOSIS — K219 Gastro-esophageal reflux disease without esophagitis: Secondary | ICD-10-CM | POA: Diagnosis not present

## 2021-10-26 DIAGNOSIS — R5381 Other malaise: Secondary | ICD-10-CM | POA: Diagnosis not present

## 2021-10-26 DIAGNOSIS — I480 Paroxysmal atrial fibrillation: Secondary | ICD-10-CM | POA: Diagnosis not present

## 2021-10-26 DIAGNOSIS — I10 Essential (primary) hypertension: Secondary | ICD-10-CM | POA: Diagnosis not present

## 2021-10-26 DIAGNOSIS — E1159 Type 2 diabetes mellitus with other circulatory complications: Secondary | ICD-10-CM | POA: Diagnosis not present

## 2021-10-26 DIAGNOSIS — E1142 Type 2 diabetes mellitus with diabetic polyneuropathy: Secondary | ICD-10-CM | POA: Diagnosis not present

## 2021-10-26 DIAGNOSIS — E1151 Type 2 diabetes mellitus with diabetic peripheral angiopathy without gangrene: Secondary | ICD-10-CM | POA: Diagnosis not present

## 2021-10-26 DIAGNOSIS — R1319 Other dysphagia: Secondary | ICD-10-CM | POA: Diagnosis not present

## 2021-10-31 DIAGNOSIS — I10 Essential (primary) hypertension: Secondary | ICD-10-CM | POA: Diagnosis not present

## 2021-10-31 DIAGNOSIS — E118 Type 2 diabetes mellitus with unspecified complications: Secondary | ICD-10-CM | POA: Diagnosis not present

## 2021-11-08 DIAGNOSIS — I10 Essential (primary) hypertension: Secondary | ICD-10-CM | POA: Diagnosis not present

## 2021-11-08 DIAGNOSIS — N183 Chronic kidney disease, stage 3 unspecified: Secondary | ICD-10-CM | POA: Diagnosis not present

## 2021-11-08 DIAGNOSIS — I480 Paroxysmal atrial fibrillation: Secondary | ICD-10-CM | POA: Diagnosis not present

## 2021-11-08 DIAGNOSIS — F32A Depression, unspecified: Secondary | ICD-10-CM | POA: Diagnosis not present

## 2021-11-08 DIAGNOSIS — I6381 Other cerebral infarction due to occlusion or stenosis of small artery: Secondary | ICD-10-CM | POA: Diagnosis not present

## 2021-11-08 DIAGNOSIS — E1142 Type 2 diabetes mellitus with diabetic polyneuropathy: Secondary | ICD-10-CM | POA: Diagnosis not present

## 2021-11-08 DIAGNOSIS — R1319 Other dysphagia: Secondary | ICD-10-CM | POA: Diagnosis not present

## 2021-11-08 DIAGNOSIS — E1151 Type 2 diabetes mellitus with diabetic peripheral angiopathy without gangrene: Secondary | ICD-10-CM | POA: Diagnosis not present

## 2021-11-08 DIAGNOSIS — R5381 Other malaise: Secondary | ICD-10-CM | POA: Diagnosis not present

## 2021-11-08 DIAGNOSIS — I2510 Atherosclerotic heart disease of native coronary artery without angina pectoris: Secondary | ICD-10-CM | POA: Diagnosis not present

## 2021-11-12 DIAGNOSIS — F01C3 Vascular dementia, severe, with mood disturbance: Secondary | ICD-10-CM | POA: Diagnosis not present

## 2021-11-12 DIAGNOSIS — I69398 Other sequelae of cerebral infarction: Secondary | ICD-10-CM | POA: Diagnosis not present

## 2021-11-12 DIAGNOSIS — G4701 Insomnia due to medical condition: Secondary | ICD-10-CM | POA: Diagnosis not present

## 2021-11-13 DIAGNOSIS — E559 Vitamin D deficiency, unspecified: Secondary | ICD-10-CM | POA: Diagnosis not present

## 2021-11-13 DIAGNOSIS — I10 Essential (primary) hypertension: Secondary | ICD-10-CM | POA: Diagnosis not present

## 2021-11-13 DIAGNOSIS — N183 Chronic kidney disease, stage 3 unspecified: Secondary | ICD-10-CM | POA: Diagnosis not present

## 2021-11-13 DIAGNOSIS — E785 Hyperlipidemia, unspecified: Secondary | ICD-10-CM | POA: Diagnosis not present

## 2021-11-20 DIAGNOSIS — Z23 Encounter for immunization: Secondary | ICD-10-CM | POA: Diagnosis not present

## 2021-11-26 DIAGNOSIS — G4700 Insomnia, unspecified: Secondary | ICD-10-CM | POA: Diagnosis not present

## 2021-11-28 DIAGNOSIS — I10 Essential (primary) hypertension: Secondary | ICD-10-CM | POA: Diagnosis not present

## 2021-11-28 DIAGNOSIS — E118 Type 2 diabetes mellitus with unspecified complications: Secondary | ICD-10-CM | POA: Diagnosis not present

## 2021-12-07 DIAGNOSIS — F32A Depression, unspecified: Secondary | ICD-10-CM | POA: Diagnosis not present

## 2021-12-07 DIAGNOSIS — I10 Essential (primary) hypertension: Secondary | ICD-10-CM | POA: Diagnosis not present

## 2021-12-07 DIAGNOSIS — R569 Unspecified convulsions: Secondary | ICD-10-CM | POA: Diagnosis not present

## 2021-12-07 DIAGNOSIS — I2510 Atherosclerotic heart disease of native coronary artery without angina pectoris: Secondary | ICD-10-CM | POA: Diagnosis not present

## 2021-12-07 DIAGNOSIS — R062 Wheezing: Secondary | ICD-10-CM | POA: Diagnosis not present

## 2021-12-07 DIAGNOSIS — I480 Paroxysmal atrial fibrillation: Secondary | ICD-10-CM | POA: Diagnosis not present

## 2021-12-07 DIAGNOSIS — E559 Vitamin D deficiency, unspecified: Secondary | ICD-10-CM | POA: Diagnosis not present

## 2021-12-07 DIAGNOSIS — N183 Chronic kidney disease, stage 3 unspecified: Secondary | ICD-10-CM | POA: Diagnosis not present

## 2021-12-07 DIAGNOSIS — K219 Gastro-esophageal reflux disease without esophagitis: Secondary | ICD-10-CM | POA: Diagnosis not present

## 2021-12-07 DIAGNOSIS — E1151 Type 2 diabetes mellitus with diabetic peripheral angiopathy without gangrene: Secondary | ICD-10-CM | POA: Diagnosis not present

## 2021-12-07 DIAGNOSIS — D649 Anemia, unspecified: Secondary | ICD-10-CM | POA: Diagnosis not present

## 2021-12-07 DIAGNOSIS — E1142 Type 2 diabetes mellitus with diabetic polyneuropathy: Secondary | ICD-10-CM | POA: Diagnosis not present

## 2021-12-07 DIAGNOSIS — E785 Hyperlipidemia, unspecified: Secondary | ICD-10-CM | POA: Diagnosis not present

## 2021-12-10 DIAGNOSIS — I69398 Other sequelae of cerebral infarction: Secondary | ICD-10-CM | POA: Diagnosis not present

## 2021-12-10 DIAGNOSIS — G4701 Insomnia due to medical condition: Secondary | ICD-10-CM | POA: Diagnosis not present

## 2021-12-10 DIAGNOSIS — F01C3 Vascular dementia, severe, with mood disturbance: Secondary | ICD-10-CM | POA: Diagnosis not present

## 2021-12-19 DIAGNOSIS — F32A Depression, unspecified: Secondary | ICD-10-CM | POA: Diagnosis not present

## 2021-12-19 DIAGNOSIS — I10 Essential (primary) hypertension: Secondary | ICD-10-CM | POA: Diagnosis not present

## 2021-12-19 DIAGNOSIS — E785 Hyperlipidemia, unspecified: Secondary | ICD-10-CM | POA: Diagnosis not present

## 2021-12-19 DIAGNOSIS — I480 Paroxysmal atrial fibrillation: Secondary | ICD-10-CM | POA: Diagnosis not present

## 2021-12-19 DIAGNOSIS — R058 Other specified cough: Secondary | ICD-10-CM | POA: Diagnosis not present

## 2021-12-19 DIAGNOSIS — E1151 Type 2 diabetes mellitus with diabetic peripheral angiopathy without gangrene: Secondary | ICD-10-CM | POA: Diagnosis not present

## 2021-12-19 DIAGNOSIS — N183 Chronic kidney disease, stage 3 unspecified: Secondary | ICD-10-CM | POA: Diagnosis not present

## 2021-12-19 DIAGNOSIS — I2510 Atherosclerotic heart disease of native coronary artery without angina pectoris: Secondary | ICD-10-CM | POA: Diagnosis not present

## 2021-12-19 DIAGNOSIS — K219 Gastro-esophageal reflux disease without esophagitis: Secondary | ICD-10-CM | POA: Diagnosis not present

## 2021-12-19 DIAGNOSIS — R569 Unspecified convulsions: Secondary | ICD-10-CM | POA: Diagnosis not present

## 2021-12-21 DIAGNOSIS — H9193 Unspecified hearing loss, bilateral: Secondary | ICD-10-CM | POA: Diagnosis not present

## 2021-12-28 DIAGNOSIS — I10 Essential (primary) hypertension: Secondary | ICD-10-CM | POA: Diagnosis not present

## 2021-12-28 DIAGNOSIS — E1151 Type 2 diabetes mellitus with diabetic peripheral angiopathy without gangrene: Secondary | ICD-10-CM | POA: Diagnosis not present

## 2022-01-04 DIAGNOSIS — E1151 Type 2 diabetes mellitus with diabetic peripheral angiopathy without gangrene: Secondary | ICD-10-CM | POA: Diagnosis not present

## 2022-01-04 DIAGNOSIS — K219 Gastro-esophageal reflux disease without esophagitis: Secondary | ICD-10-CM | POA: Diagnosis not present

## 2022-01-04 DIAGNOSIS — I10 Essential (primary) hypertension: Secondary | ICD-10-CM | POA: Diagnosis not present

## 2022-01-04 DIAGNOSIS — E785 Hyperlipidemia, unspecified: Secondary | ICD-10-CM | POA: Diagnosis not present

## 2022-01-04 DIAGNOSIS — F32A Depression, unspecified: Secondary | ICD-10-CM | POA: Diagnosis not present

## 2022-01-04 DIAGNOSIS — R5381 Other malaise: Secondary | ICD-10-CM | POA: Diagnosis not present

## 2022-01-04 DIAGNOSIS — I480 Paroxysmal atrial fibrillation: Secondary | ICD-10-CM | POA: Diagnosis not present

## 2022-01-04 DIAGNOSIS — N183 Chronic kidney disease, stage 3 unspecified: Secondary | ICD-10-CM | POA: Diagnosis not present

## 2022-01-04 DIAGNOSIS — R569 Unspecified convulsions: Secondary | ICD-10-CM | POA: Diagnosis not present

## 2022-01-04 DIAGNOSIS — E559 Vitamin D deficiency, unspecified: Secondary | ICD-10-CM | POA: Diagnosis not present

## 2022-01-04 DIAGNOSIS — I2510 Atherosclerotic heart disease of native coronary artery without angina pectoris: Secondary | ICD-10-CM | POA: Diagnosis not present

## 2022-01-07 DIAGNOSIS — F01C3 Vascular dementia, severe, with mood disturbance: Secondary | ICD-10-CM | POA: Diagnosis not present

## 2022-01-07 DIAGNOSIS — I69398 Other sequelae of cerebral infarction: Secondary | ICD-10-CM | POA: Diagnosis not present

## 2022-01-07 DIAGNOSIS — G4700 Insomnia, unspecified: Secondary | ICD-10-CM | POA: Diagnosis not present

## 2022-01-08 DIAGNOSIS — I480 Paroxysmal atrial fibrillation: Secondary | ICD-10-CM | POA: Diagnosis not present

## 2022-01-08 DIAGNOSIS — I2510 Atherosclerotic heart disease of native coronary artery without angina pectoris: Secondary | ICD-10-CM | POA: Diagnosis not present

## 2022-01-08 DIAGNOSIS — N183 Chronic kidney disease, stage 3 unspecified: Secondary | ICD-10-CM | POA: Diagnosis not present

## 2022-01-08 DIAGNOSIS — I10 Essential (primary) hypertension: Secondary | ICD-10-CM | POA: Diagnosis not present

## 2022-01-08 DIAGNOSIS — E1151 Type 2 diabetes mellitus with diabetic peripheral angiopathy without gangrene: Secondary | ICD-10-CM | POA: Diagnosis not present

## 2022-01-08 DIAGNOSIS — E559 Vitamin D deficiency, unspecified: Secondary | ICD-10-CM | POA: Diagnosis not present

## 2022-01-08 DIAGNOSIS — E785 Hyperlipidemia, unspecified: Secondary | ICD-10-CM | POA: Diagnosis not present

## 2022-01-08 DIAGNOSIS — E1140 Type 2 diabetes mellitus with diabetic neuropathy, unspecified: Secondary | ICD-10-CM | POA: Diagnosis not present

## 2022-01-24 DIAGNOSIS — L603 Nail dystrophy: Secondary | ICD-10-CM | POA: Diagnosis not present

## 2022-01-24 DIAGNOSIS — K219 Gastro-esophageal reflux disease without esophagitis: Secondary | ICD-10-CM | POA: Diagnosis not present

## 2022-01-24 DIAGNOSIS — R569 Unspecified convulsions: Secondary | ICD-10-CM | POA: Diagnosis not present

## 2022-01-24 DIAGNOSIS — N183 Chronic kidney disease, stage 3 unspecified: Secondary | ICD-10-CM | POA: Diagnosis not present

## 2022-01-24 DIAGNOSIS — B351 Tinea unguium: Secondary | ICD-10-CM | POA: Diagnosis not present

## 2022-01-24 DIAGNOSIS — I2510 Atherosclerotic heart disease of native coronary artery without angina pectoris: Secondary | ICD-10-CM | POA: Diagnosis not present

## 2022-01-24 DIAGNOSIS — I739 Peripheral vascular disease, unspecified: Secondary | ICD-10-CM | POA: Diagnosis not present

## 2022-01-24 DIAGNOSIS — E559 Vitamin D deficiency, unspecified: Secondary | ICD-10-CM | POA: Diagnosis not present

## 2022-01-24 DIAGNOSIS — E1142 Type 2 diabetes mellitus with diabetic polyneuropathy: Secondary | ICD-10-CM | POA: Diagnosis not present

## 2022-01-24 DIAGNOSIS — I10 Essential (primary) hypertension: Secondary | ICD-10-CM | POA: Diagnosis not present

## 2022-01-24 DIAGNOSIS — I480 Paroxysmal atrial fibrillation: Secondary | ICD-10-CM | POA: Diagnosis not present

## 2022-02-06 DIAGNOSIS — N183 Chronic kidney disease, stage 3 unspecified: Secondary | ICD-10-CM | POA: Diagnosis not present

## 2022-02-06 DIAGNOSIS — E1151 Type 2 diabetes mellitus with diabetic peripheral angiopathy without gangrene: Secondary | ICD-10-CM | POA: Diagnosis not present

## 2022-02-06 DIAGNOSIS — R1319 Other dysphagia: Secondary | ICD-10-CM | POA: Diagnosis not present

## 2022-02-06 DIAGNOSIS — E785 Hyperlipidemia, unspecified: Secondary | ICD-10-CM | POA: Diagnosis not present

## 2022-02-06 DIAGNOSIS — K219 Gastro-esophageal reflux disease without esophagitis: Secondary | ICD-10-CM | POA: Diagnosis not present

## 2022-02-06 DIAGNOSIS — I480 Paroxysmal atrial fibrillation: Secondary | ICD-10-CM | POA: Diagnosis not present

## 2022-02-06 DIAGNOSIS — F0152 Vascular dementia, unspecified severity, with psychotic disturbance: Secondary | ICD-10-CM | POA: Diagnosis not present

## 2022-02-06 DIAGNOSIS — E1142 Type 2 diabetes mellitus with diabetic polyneuropathy: Secondary | ICD-10-CM | POA: Diagnosis not present

## 2022-02-06 DIAGNOSIS — I2510 Atherosclerotic heart disease of native coronary artery without angina pectoris: Secondary | ICD-10-CM | POA: Diagnosis not present

## 2022-02-07 DIAGNOSIS — M6281 Muscle weakness (generalized): Secondary | ICD-10-CM | POA: Diagnosis not present

## 2022-02-07 DIAGNOSIS — E1151 Type 2 diabetes mellitus with diabetic peripheral angiopathy without gangrene: Secondary | ICD-10-CM | POA: Diagnosis not present

## 2022-02-07 DIAGNOSIS — Z741 Need for assistance with personal care: Secondary | ICD-10-CM | POA: Diagnosis not present

## 2022-02-08 DIAGNOSIS — E1151 Type 2 diabetes mellitus with diabetic peripheral angiopathy without gangrene: Secondary | ICD-10-CM | POA: Diagnosis not present

## 2022-02-08 DIAGNOSIS — Z741 Need for assistance with personal care: Secondary | ICD-10-CM | POA: Diagnosis not present

## 2022-02-08 DIAGNOSIS — M6281 Muscle weakness (generalized): Secondary | ICD-10-CM | POA: Diagnosis not present

## 2022-02-11 DIAGNOSIS — E1151 Type 2 diabetes mellitus with diabetic peripheral angiopathy without gangrene: Secondary | ICD-10-CM | POA: Diagnosis not present

## 2022-02-11 DIAGNOSIS — Z741 Need for assistance with personal care: Secondary | ICD-10-CM | POA: Diagnosis not present

## 2022-02-11 DIAGNOSIS — M6281 Muscle weakness (generalized): Secondary | ICD-10-CM | POA: Diagnosis not present

## 2022-02-11 DIAGNOSIS — I69398 Other sequelae of cerebral infarction: Secondary | ICD-10-CM | POA: Diagnosis not present

## 2022-02-11 DIAGNOSIS — F01C3 Vascular dementia, severe, with mood disturbance: Secondary | ICD-10-CM | POA: Diagnosis not present

## 2022-02-12 DIAGNOSIS — E1151 Type 2 diabetes mellitus with diabetic peripheral angiopathy without gangrene: Secondary | ICD-10-CM | POA: Diagnosis not present

## 2022-02-12 DIAGNOSIS — M6281 Muscle weakness (generalized): Secondary | ICD-10-CM | POA: Diagnosis not present

## 2022-02-12 DIAGNOSIS — Z741 Need for assistance with personal care: Secondary | ICD-10-CM | POA: Diagnosis not present

## 2022-02-13 DIAGNOSIS — I2510 Atherosclerotic heart disease of native coronary artery without angina pectoris: Secondary | ICD-10-CM | POA: Diagnosis not present

## 2022-02-13 DIAGNOSIS — E1151 Type 2 diabetes mellitus with diabetic peripheral angiopathy without gangrene: Secondary | ICD-10-CM | POA: Diagnosis not present

## 2022-02-13 DIAGNOSIS — I10 Essential (primary) hypertension: Secondary | ICD-10-CM | POA: Diagnosis not present

## 2022-02-13 DIAGNOSIS — I480 Paroxysmal atrial fibrillation: Secondary | ICD-10-CM | POA: Diagnosis not present

## 2022-02-13 DIAGNOSIS — E559 Vitamin D deficiency, unspecified: Secondary | ICD-10-CM | POA: Diagnosis not present

## 2022-02-13 DIAGNOSIS — E785 Hyperlipidemia, unspecified: Secondary | ICD-10-CM | POA: Diagnosis not present

## 2022-02-13 DIAGNOSIS — N183 Chronic kidney disease, stage 3 unspecified: Secondary | ICD-10-CM | POA: Diagnosis not present

## 2022-02-14 DIAGNOSIS — Z741 Need for assistance with personal care: Secondary | ICD-10-CM | POA: Diagnosis not present

## 2022-02-14 DIAGNOSIS — E1151 Type 2 diabetes mellitus with diabetic peripheral angiopathy without gangrene: Secondary | ICD-10-CM | POA: Diagnosis not present

## 2022-02-14 DIAGNOSIS — M6281 Muscle weakness (generalized): Secondary | ICD-10-CM | POA: Diagnosis not present

## 2022-02-15 DIAGNOSIS — Z741 Need for assistance with personal care: Secondary | ICD-10-CM | POA: Diagnosis not present

## 2022-02-15 DIAGNOSIS — M6281 Muscle weakness (generalized): Secondary | ICD-10-CM | POA: Diagnosis not present

## 2022-02-15 DIAGNOSIS — E1151 Type 2 diabetes mellitus with diabetic peripheral angiopathy without gangrene: Secondary | ICD-10-CM | POA: Diagnosis not present

## 2022-02-18 DIAGNOSIS — E1151 Type 2 diabetes mellitus with diabetic peripheral angiopathy without gangrene: Secondary | ICD-10-CM | POA: Diagnosis not present

## 2022-02-18 DIAGNOSIS — M6281 Muscle weakness (generalized): Secondary | ICD-10-CM | POA: Diagnosis not present

## 2022-02-18 DIAGNOSIS — Z741 Need for assistance with personal care: Secondary | ICD-10-CM | POA: Diagnosis not present

## 2022-02-19 DIAGNOSIS — M6281 Muscle weakness (generalized): Secondary | ICD-10-CM | POA: Diagnosis not present

## 2022-02-19 DIAGNOSIS — Z741 Need for assistance with personal care: Secondary | ICD-10-CM | POA: Diagnosis not present

## 2022-02-19 DIAGNOSIS — E1151 Type 2 diabetes mellitus with diabetic peripheral angiopathy without gangrene: Secondary | ICD-10-CM | POA: Diagnosis not present

## 2022-02-20 DIAGNOSIS — M6281 Muscle weakness (generalized): Secondary | ICD-10-CM | POA: Diagnosis not present

## 2022-02-20 DIAGNOSIS — Z741 Need for assistance with personal care: Secondary | ICD-10-CM | POA: Diagnosis not present

## 2022-02-20 DIAGNOSIS — E1151 Type 2 diabetes mellitus with diabetic peripheral angiopathy without gangrene: Secondary | ICD-10-CM | POA: Diagnosis not present

## 2022-02-21 DIAGNOSIS — E559 Vitamin D deficiency, unspecified: Secondary | ICD-10-CM | POA: Diagnosis not present

## 2022-02-21 DIAGNOSIS — E1151 Type 2 diabetes mellitus with diabetic peripheral angiopathy without gangrene: Secondary | ICD-10-CM | POA: Diagnosis not present

## 2022-02-21 DIAGNOSIS — E119 Type 2 diabetes mellitus without complications: Secondary | ICD-10-CM | POA: Diagnosis not present

## 2022-02-21 DIAGNOSIS — Z741 Need for assistance with personal care: Secondary | ICD-10-CM | POA: Diagnosis not present

## 2022-02-21 DIAGNOSIS — M6281 Muscle weakness (generalized): Secondary | ICD-10-CM | POA: Diagnosis not present

## 2022-02-21 DIAGNOSIS — I10 Essential (primary) hypertension: Secondary | ICD-10-CM | POA: Diagnosis not present

## 2022-02-22 DIAGNOSIS — M6281 Muscle weakness (generalized): Secondary | ICD-10-CM | POA: Diagnosis not present

## 2022-02-22 DIAGNOSIS — Z741 Need for assistance with personal care: Secondary | ICD-10-CM | POA: Diagnosis not present

## 2022-02-22 DIAGNOSIS — E1151 Type 2 diabetes mellitus with diabetic peripheral angiopathy without gangrene: Secondary | ICD-10-CM | POA: Diagnosis not present

## 2022-02-25 DIAGNOSIS — E1151 Type 2 diabetes mellitus with diabetic peripheral angiopathy without gangrene: Secondary | ICD-10-CM | POA: Diagnosis not present

## 2022-02-25 DIAGNOSIS — R059 Cough, unspecified: Secondary | ICD-10-CM | POA: Diagnosis not present

## 2022-02-25 DIAGNOSIS — R051 Acute cough: Secondary | ICD-10-CM | POA: Diagnosis not present

## 2022-02-25 DIAGNOSIS — M6281 Muscle weakness (generalized): Secondary | ICD-10-CM | POA: Diagnosis not present

## 2022-02-25 DIAGNOSIS — Z741 Need for assistance with personal care: Secondary | ICD-10-CM | POA: Diagnosis not present

## 2022-02-26 DIAGNOSIS — Z741 Need for assistance with personal care: Secondary | ICD-10-CM | POA: Diagnosis not present

## 2022-02-26 DIAGNOSIS — E1151 Type 2 diabetes mellitus with diabetic peripheral angiopathy without gangrene: Secondary | ICD-10-CM | POA: Diagnosis not present

## 2022-02-26 DIAGNOSIS — M6281 Muscle weakness (generalized): Secondary | ICD-10-CM | POA: Diagnosis not present

## 2022-02-27 DIAGNOSIS — M6281 Muscle weakness (generalized): Secondary | ICD-10-CM | POA: Diagnosis not present

## 2022-02-27 DIAGNOSIS — E1151 Type 2 diabetes mellitus with diabetic peripheral angiopathy without gangrene: Secondary | ICD-10-CM | POA: Diagnosis not present

## 2022-02-27 DIAGNOSIS — Z741 Need for assistance with personal care: Secondary | ICD-10-CM | POA: Diagnosis not present

## 2022-02-28 DIAGNOSIS — R1312 Dysphagia, oropharyngeal phase: Secondary | ICD-10-CM | POA: Diagnosis not present

## 2022-02-28 DIAGNOSIS — E1151 Type 2 diabetes mellitus with diabetic peripheral angiopathy without gangrene: Secondary | ICD-10-CM | POA: Diagnosis not present

## 2022-02-28 DIAGNOSIS — I2510 Atherosclerotic heart disease of native coronary artery without angina pectoris: Secondary | ICD-10-CM | POA: Diagnosis not present

## 2022-02-28 DIAGNOSIS — E785 Hyperlipidemia, unspecified: Secondary | ICD-10-CM | POA: Diagnosis not present

## 2022-02-28 DIAGNOSIS — I10 Essential (primary) hypertension: Secondary | ICD-10-CM | POA: Diagnosis not present

## 2022-02-28 DIAGNOSIS — K219 Gastro-esophageal reflux disease without esophagitis: Secondary | ICD-10-CM | POA: Diagnosis not present

## 2022-02-28 DIAGNOSIS — Z741 Need for assistance with personal care: Secondary | ICD-10-CM | POA: Diagnosis not present

## 2022-02-28 DIAGNOSIS — M6281 Muscle weakness (generalized): Secondary | ICD-10-CM | POA: Diagnosis not present

## 2022-03-01 DIAGNOSIS — E559 Vitamin D deficiency, unspecified: Secondary | ICD-10-CM | POA: Diagnosis not present

## 2022-03-01 DIAGNOSIS — E119 Type 2 diabetes mellitus without complications: Secondary | ICD-10-CM | POA: Diagnosis not present

## 2022-03-01 DIAGNOSIS — Z79899 Other long term (current) drug therapy: Secondary | ICD-10-CM | POA: Diagnosis not present

## 2022-03-02 DIAGNOSIS — M6281 Muscle weakness (generalized): Secondary | ICD-10-CM | POA: Diagnosis not present

## 2022-03-02 DIAGNOSIS — E1151 Type 2 diabetes mellitus with diabetic peripheral angiopathy without gangrene: Secondary | ICD-10-CM | POA: Diagnosis not present

## 2022-03-02 DIAGNOSIS — R1312 Dysphagia, oropharyngeal phase: Secondary | ICD-10-CM | POA: Diagnosis not present

## 2022-03-02 DIAGNOSIS — Z741 Need for assistance with personal care: Secondary | ICD-10-CM | POA: Diagnosis not present

## 2022-03-03 DIAGNOSIS — M6281 Muscle weakness (generalized): Secondary | ICD-10-CM | POA: Diagnosis not present

## 2022-03-03 DIAGNOSIS — Z741 Need for assistance with personal care: Secondary | ICD-10-CM | POA: Diagnosis not present

## 2022-03-03 DIAGNOSIS — E1151 Type 2 diabetes mellitus with diabetic peripheral angiopathy without gangrene: Secondary | ICD-10-CM | POA: Diagnosis not present

## 2022-03-03 DIAGNOSIS — R1312 Dysphagia, oropharyngeal phase: Secondary | ICD-10-CM | POA: Diagnosis not present

## 2022-03-04 DIAGNOSIS — R1312 Dysphagia, oropharyngeal phase: Secondary | ICD-10-CM | POA: Diagnosis not present

## 2022-03-04 DIAGNOSIS — M6281 Muscle weakness (generalized): Secondary | ICD-10-CM | POA: Diagnosis not present

## 2022-03-04 DIAGNOSIS — Z741 Need for assistance with personal care: Secondary | ICD-10-CM | POA: Diagnosis not present

## 2022-03-04 DIAGNOSIS — E1151 Type 2 diabetes mellitus with diabetic peripheral angiopathy without gangrene: Secondary | ICD-10-CM | POA: Diagnosis not present

## 2022-03-05 DIAGNOSIS — Z741 Need for assistance with personal care: Secondary | ICD-10-CM | POA: Diagnosis not present

## 2022-03-05 DIAGNOSIS — E119 Type 2 diabetes mellitus without complications: Secondary | ICD-10-CM | POA: Diagnosis not present

## 2022-03-05 DIAGNOSIS — H26492 Other secondary cataract, left eye: Secondary | ICD-10-CM | POA: Diagnosis not present

## 2022-03-05 DIAGNOSIS — Z961 Presence of intraocular lens: Secondary | ICD-10-CM | POA: Diagnosis not present

## 2022-03-05 DIAGNOSIS — E1151 Type 2 diabetes mellitus with diabetic peripheral angiopathy without gangrene: Secondary | ICD-10-CM | POA: Diagnosis not present

## 2022-03-05 DIAGNOSIS — M6281 Muscle weakness (generalized): Secondary | ICD-10-CM | POA: Diagnosis not present

## 2022-03-05 DIAGNOSIS — R1312 Dysphagia, oropharyngeal phase: Secondary | ICD-10-CM | POA: Diagnosis not present

## 2022-03-06 DIAGNOSIS — I10 Essential (primary) hypertension: Secondary | ICD-10-CM | POA: Diagnosis not present

## 2022-03-06 DIAGNOSIS — N183 Chronic kidney disease, stage 3 unspecified: Secondary | ICD-10-CM | POA: Diagnosis not present

## 2022-03-06 DIAGNOSIS — E559 Vitamin D deficiency, unspecified: Secondary | ICD-10-CM | POA: Diagnosis not present

## 2022-03-06 DIAGNOSIS — E785 Hyperlipidemia, unspecified: Secondary | ICD-10-CM | POA: Diagnosis not present

## 2022-03-06 DIAGNOSIS — R1312 Dysphagia, oropharyngeal phase: Secondary | ICD-10-CM | POA: Diagnosis not present

## 2022-03-06 DIAGNOSIS — E1151 Type 2 diabetes mellitus with diabetic peripheral angiopathy without gangrene: Secondary | ICD-10-CM | POA: Diagnosis not present

## 2022-03-06 DIAGNOSIS — K219 Gastro-esophageal reflux disease without esophagitis: Secondary | ICD-10-CM | POA: Diagnosis not present

## 2022-03-06 DIAGNOSIS — D649 Anemia, unspecified: Secondary | ICD-10-CM | POA: Diagnosis not present

## 2022-03-06 DIAGNOSIS — M6281 Muscle weakness (generalized): Secondary | ICD-10-CM | POA: Diagnosis not present

## 2022-03-06 DIAGNOSIS — Z741 Need for assistance with personal care: Secondary | ICD-10-CM | POA: Diagnosis not present

## 2022-03-06 DIAGNOSIS — R5381 Other malaise: Secondary | ICD-10-CM | POA: Diagnosis not present

## 2022-03-06 DIAGNOSIS — I2510 Atherosclerotic heart disease of native coronary artery without angina pectoris: Secondary | ICD-10-CM | POA: Diagnosis not present

## 2022-03-06 DIAGNOSIS — I480 Paroxysmal atrial fibrillation: Secondary | ICD-10-CM | POA: Diagnosis not present

## 2022-03-07 DIAGNOSIS — Z741 Need for assistance with personal care: Secondary | ICD-10-CM | POA: Diagnosis not present

## 2022-03-07 DIAGNOSIS — R1312 Dysphagia, oropharyngeal phase: Secondary | ICD-10-CM | POA: Diagnosis not present

## 2022-03-07 DIAGNOSIS — E1151 Type 2 diabetes mellitus with diabetic peripheral angiopathy without gangrene: Secondary | ICD-10-CM | POA: Diagnosis not present

## 2022-03-07 DIAGNOSIS — M6281 Muscle weakness (generalized): Secondary | ICD-10-CM | POA: Diagnosis not present

## 2022-03-08 DIAGNOSIS — E1151 Type 2 diabetes mellitus with diabetic peripheral angiopathy without gangrene: Secondary | ICD-10-CM | POA: Diagnosis not present

## 2022-03-08 DIAGNOSIS — Z741 Need for assistance with personal care: Secondary | ICD-10-CM | POA: Diagnosis not present

## 2022-03-08 DIAGNOSIS — R1312 Dysphagia, oropharyngeal phase: Secondary | ICD-10-CM | POA: Diagnosis not present

## 2022-03-08 DIAGNOSIS — M6281 Muscle weakness (generalized): Secondary | ICD-10-CM | POA: Diagnosis not present

## 2022-03-09 DIAGNOSIS — Z741 Need for assistance with personal care: Secondary | ICD-10-CM | POA: Diagnosis not present

## 2022-03-09 DIAGNOSIS — E1151 Type 2 diabetes mellitus with diabetic peripheral angiopathy without gangrene: Secondary | ICD-10-CM | POA: Diagnosis not present

## 2022-03-09 DIAGNOSIS — R1312 Dysphagia, oropharyngeal phase: Secondary | ICD-10-CM | POA: Diagnosis not present

## 2022-03-09 DIAGNOSIS — M6281 Muscle weakness (generalized): Secondary | ICD-10-CM | POA: Diagnosis not present

## 2022-03-11 DIAGNOSIS — M6281 Muscle weakness (generalized): Secondary | ICD-10-CM | POA: Diagnosis not present

## 2022-03-11 DIAGNOSIS — Z741 Need for assistance with personal care: Secondary | ICD-10-CM | POA: Diagnosis not present

## 2022-03-11 DIAGNOSIS — E1151 Type 2 diabetes mellitus with diabetic peripheral angiopathy without gangrene: Secondary | ICD-10-CM | POA: Diagnosis not present

## 2022-03-11 DIAGNOSIS — R1312 Dysphagia, oropharyngeal phase: Secondary | ICD-10-CM | POA: Diagnosis not present

## 2022-03-12 DIAGNOSIS — R1312 Dysphagia, oropharyngeal phase: Secondary | ICD-10-CM | POA: Diagnosis not present

## 2022-03-12 DIAGNOSIS — Z741 Need for assistance with personal care: Secondary | ICD-10-CM | POA: Diagnosis not present

## 2022-03-12 DIAGNOSIS — E1151 Type 2 diabetes mellitus with diabetic peripheral angiopathy without gangrene: Secondary | ICD-10-CM | POA: Diagnosis not present

## 2022-03-12 DIAGNOSIS — M6281 Muscle weakness (generalized): Secondary | ICD-10-CM | POA: Diagnosis not present

## 2022-03-13 DIAGNOSIS — E1151 Type 2 diabetes mellitus with diabetic peripheral angiopathy without gangrene: Secondary | ICD-10-CM | POA: Diagnosis not present

## 2022-03-13 DIAGNOSIS — M6281 Muscle weakness (generalized): Secondary | ICD-10-CM | POA: Diagnosis not present

## 2022-03-13 DIAGNOSIS — Z741 Need for assistance with personal care: Secondary | ICD-10-CM | POA: Diagnosis not present

## 2022-03-13 DIAGNOSIS — R1312 Dysphagia, oropharyngeal phase: Secondary | ICD-10-CM | POA: Diagnosis not present

## 2022-03-14 DIAGNOSIS — N183 Chronic kidney disease, stage 3 unspecified: Secondary | ICD-10-CM | POA: Diagnosis not present

## 2022-03-14 DIAGNOSIS — I480 Paroxysmal atrial fibrillation: Secondary | ICD-10-CM | POA: Diagnosis not present

## 2022-03-14 DIAGNOSIS — E559 Vitamin D deficiency, unspecified: Secondary | ICD-10-CM | POA: Diagnosis not present

## 2022-03-14 DIAGNOSIS — M6281 Muscle weakness (generalized): Secondary | ICD-10-CM | POA: Diagnosis not present

## 2022-03-14 DIAGNOSIS — E785 Hyperlipidemia, unspecified: Secondary | ICD-10-CM | POA: Diagnosis not present

## 2022-03-14 DIAGNOSIS — I10 Essential (primary) hypertension: Secondary | ICD-10-CM | POA: Diagnosis not present

## 2022-03-14 DIAGNOSIS — I2510 Atherosclerotic heart disease of native coronary artery without angina pectoris: Secondary | ICD-10-CM | POA: Diagnosis not present

## 2022-03-14 DIAGNOSIS — R1312 Dysphagia, oropharyngeal phase: Secondary | ICD-10-CM | POA: Diagnosis not present

## 2022-03-14 DIAGNOSIS — Z741 Need for assistance with personal care: Secondary | ICD-10-CM | POA: Diagnosis not present

## 2022-03-14 DIAGNOSIS — E1151 Type 2 diabetes mellitus with diabetic peripheral angiopathy without gangrene: Secondary | ICD-10-CM | POA: Diagnosis not present

## 2022-03-15 DIAGNOSIS — Z741 Need for assistance with personal care: Secondary | ICD-10-CM | POA: Diagnosis not present

## 2022-03-15 DIAGNOSIS — M6281 Muscle weakness (generalized): Secondary | ICD-10-CM | POA: Diagnosis not present

## 2022-03-15 DIAGNOSIS — E1151 Type 2 diabetes mellitus with diabetic peripheral angiopathy without gangrene: Secondary | ICD-10-CM | POA: Diagnosis not present

## 2022-03-15 DIAGNOSIS — R1312 Dysphagia, oropharyngeal phase: Secondary | ICD-10-CM | POA: Diagnosis not present

## 2022-03-16 DIAGNOSIS — R1312 Dysphagia, oropharyngeal phase: Secondary | ICD-10-CM | POA: Diagnosis not present

## 2022-03-16 DIAGNOSIS — M6281 Muscle weakness (generalized): Secondary | ICD-10-CM | POA: Diagnosis not present

## 2022-03-16 DIAGNOSIS — E1151 Type 2 diabetes mellitus with diabetic peripheral angiopathy without gangrene: Secondary | ICD-10-CM | POA: Diagnosis not present

## 2022-03-16 DIAGNOSIS — Z741 Need for assistance with personal care: Secondary | ICD-10-CM | POA: Diagnosis not present

## 2022-03-18 DIAGNOSIS — M6281 Muscle weakness (generalized): Secondary | ICD-10-CM | POA: Diagnosis not present

## 2022-03-18 DIAGNOSIS — R1312 Dysphagia, oropharyngeal phase: Secondary | ICD-10-CM | POA: Diagnosis not present

## 2022-03-18 DIAGNOSIS — E1151 Type 2 diabetes mellitus with diabetic peripheral angiopathy without gangrene: Secondary | ICD-10-CM | POA: Diagnosis not present

## 2022-03-18 DIAGNOSIS — Z741 Need for assistance with personal care: Secondary | ICD-10-CM | POA: Diagnosis not present

## 2022-03-19 DIAGNOSIS — M6281 Muscle weakness (generalized): Secondary | ICD-10-CM | POA: Diagnosis not present

## 2022-03-19 DIAGNOSIS — E1151 Type 2 diabetes mellitus with diabetic peripheral angiopathy without gangrene: Secondary | ICD-10-CM | POA: Diagnosis not present

## 2022-03-19 DIAGNOSIS — Z741 Need for assistance with personal care: Secondary | ICD-10-CM | POA: Diagnosis not present

## 2022-03-19 DIAGNOSIS — R1312 Dysphagia, oropharyngeal phase: Secondary | ICD-10-CM | POA: Diagnosis not present

## 2022-03-20 DIAGNOSIS — M6281 Muscle weakness (generalized): Secondary | ICD-10-CM | POA: Diagnosis not present

## 2022-03-20 DIAGNOSIS — E1151 Type 2 diabetes mellitus with diabetic peripheral angiopathy without gangrene: Secondary | ICD-10-CM | POA: Diagnosis not present

## 2022-03-20 DIAGNOSIS — R1312 Dysphagia, oropharyngeal phase: Secondary | ICD-10-CM | POA: Diagnosis not present

## 2022-03-20 DIAGNOSIS — Z741 Need for assistance with personal care: Secondary | ICD-10-CM | POA: Diagnosis not present

## 2022-03-21 DIAGNOSIS — M6281 Muscle weakness (generalized): Secondary | ICD-10-CM | POA: Diagnosis not present

## 2022-03-21 DIAGNOSIS — E1151 Type 2 diabetes mellitus with diabetic peripheral angiopathy without gangrene: Secondary | ICD-10-CM | POA: Diagnosis not present

## 2022-03-21 DIAGNOSIS — R1312 Dysphagia, oropharyngeal phase: Secondary | ICD-10-CM | POA: Diagnosis not present

## 2022-03-21 DIAGNOSIS — Z741 Need for assistance with personal care: Secondary | ICD-10-CM | POA: Diagnosis not present

## 2022-03-22 DIAGNOSIS — Z741 Need for assistance with personal care: Secondary | ICD-10-CM | POA: Diagnosis not present

## 2022-03-22 DIAGNOSIS — E1151 Type 2 diabetes mellitus with diabetic peripheral angiopathy without gangrene: Secondary | ICD-10-CM | POA: Diagnosis not present

## 2022-03-22 DIAGNOSIS — R1312 Dysphagia, oropharyngeal phase: Secondary | ICD-10-CM | POA: Diagnosis not present

## 2022-03-22 DIAGNOSIS — M6281 Muscle weakness (generalized): Secondary | ICD-10-CM | POA: Diagnosis not present

## 2022-03-25 DIAGNOSIS — M6281 Muscle weakness (generalized): Secondary | ICD-10-CM | POA: Diagnosis not present

## 2022-03-25 DIAGNOSIS — I69398 Other sequelae of cerebral infarction: Secondary | ICD-10-CM | POA: Diagnosis not present

## 2022-03-25 DIAGNOSIS — Z741 Need for assistance with personal care: Secondary | ICD-10-CM | POA: Diagnosis not present

## 2022-03-25 DIAGNOSIS — E1151 Type 2 diabetes mellitus with diabetic peripheral angiopathy without gangrene: Secondary | ICD-10-CM | POA: Diagnosis not present

## 2022-03-25 DIAGNOSIS — R1312 Dysphagia, oropharyngeal phase: Secondary | ICD-10-CM | POA: Diagnosis not present

## 2022-03-25 DIAGNOSIS — F01C3 Vascular dementia, severe, with mood disturbance: Secondary | ICD-10-CM | POA: Diagnosis not present

## 2022-03-26 DIAGNOSIS — R1312 Dysphagia, oropharyngeal phase: Secondary | ICD-10-CM | POA: Diagnosis not present

## 2022-03-26 DIAGNOSIS — Z741 Need for assistance with personal care: Secondary | ICD-10-CM | POA: Diagnosis not present

## 2022-03-26 DIAGNOSIS — E1151 Type 2 diabetes mellitus with diabetic peripheral angiopathy without gangrene: Secondary | ICD-10-CM | POA: Diagnosis not present

## 2022-03-26 DIAGNOSIS — M6281 Muscle weakness (generalized): Secondary | ICD-10-CM | POA: Diagnosis not present

## 2022-03-27 DIAGNOSIS — Z741 Need for assistance with personal care: Secondary | ICD-10-CM | POA: Diagnosis not present

## 2022-03-27 DIAGNOSIS — M6281 Muscle weakness (generalized): Secondary | ICD-10-CM | POA: Diagnosis not present

## 2022-03-27 DIAGNOSIS — E1151 Type 2 diabetes mellitus with diabetic peripheral angiopathy without gangrene: Secondary | ICD-10-CM | POA: Diagnosis not present

## 2022-03-27 DIAGNOSIS — R1312 Dysphagia, oropharyngeal phase: Secondary | ICD-10-CM | POA: Diagnosis not present

## 2022-03-28 DIAGNOSIS — Z741 Need for assistance with personal care: Secondary | ICD-10-CM | POA: Diagnosis not present

## 2022-03-28 DIAGNOSIS — E1151 Type 2 diabetes mellitus with diabetic peripheral angiopathy without gangrene: Secondary | ICD-10-CM | POA: Diagnosis not present

## 2022-03-28 DIAGNOSIS — M6281 Muscle weakness (generalized): Secondary | ICD-10-CM | POA: Diagnosis not present

## 2022-03-28 DIAGNOSIS — R1312 Dysphagia, oropharyngeal phase: Secondary | ICD-10-CM | POA: Diagnosis not present

## 2022-03-29 DIAGNOSIS — R1312 Dysphagia, oropharyngeal phase: Secondary | ICD-10-CM | POA: Diagnosis not present

## 2022-03-29 DIAGNOSIS — M6281 Muscle weakness (generalized): Secondary | ICD-10-CM | POA: Diagnosis not present

## 2022-03-29 DIAGNOSIS — E1151 Type 2 diabetes mellitus with diabetic peripheral angiopathy without gangrene: Secondary | ICD-10-CM | POA: Diagnosis not present

## 2022-03-29 DIAGNOSIS — Z741 Need for assistance with personal care: Secondary | ICD-10-CM | POA: Diagnosis not present

## 2022-04-01 DIAGNOSIS — Z741 Need for assistance with personal care: Secondary | ICD-10-CM | POA: Diagnosis not present

## 2022-04-01 DIAGNOSIS — E1151 Type 2 diabetes mellitus with diabetic peripheral angiopathy without gangrene: Secondary | ICD-10-CM | POA: Diagnosis not present

## 2022-04-01 DIAGNOSIS — M6281 Muscle weakness (generalized): Secondary | ICD-10-CM | POA: Diagnosis not present

## 2022-04-01 DIAGNOSIS — R1312 Dysphagia, oropharyngeal phase: Secondary | ICD-10-CM | POA: Diagnosis not present

## 2022-04-02 DIAGNOSIS — Z741 Need for assistance with personal care: Secondary | ICD-10-CM | POA: Diagnosis not present

## 2022-04-02 DIAGNOSIS — E1151 Type 2 diabetes mellitus with diabetic peripheral angiopathy without gangrene: Secondary | ICD-10-CM | POA: Diagnosis not present

## 2022-04-02 DIAGNOSIS — M6281 Muscle weakness (generalized): Secondary | ICD-10-CM | POA: Diagnosis not present

## 2022-04-02 DIAGNOSIS — R1312 Dysphagia, oropharyngeal phase: Secondary | ICD-10-CM | POA: Diagnosis not present

## 2022-04-03 DIAGNOSIS — K219 Gastro-esophageal reflux disease without esophagitis: Secondary | ICD-10-CM | POA: Diagnosis not present

## 2022-04-03 DIAGNOSIS — E1142 Type 2 diabetes mellitus with diabetic polyneuropathy: Secondary | ICD-10-CM | POA: Diagnosis not present

## 2022-04-03 DIAGNOSIS — M6281 Muscle weakness (generalized): Secondary | ICD-10-CM | POA: Diagnosis not present

## 2022-04-03 DIAGNOSIS — R5381 Other malaise: Secondary | ICD-10-CM | POA: Diagnosis not present

## 2022-04-03 DIAGNOSIS — I2510 Atherosclerotic heart disease of native coronary artery without angina pectoris: Secondary | ICD-10-CM | POA: Diagnosis not present

## 2022-04-03 DIAGNOSIS — E1151 Type 2 diabetes mellitus with diabetic peripheral angiopathy without gangrene: Secondary | ICD-10-CM | POA: Diagnosis not present

## 2022-04-03 DIAGNOSIS — E785 Hyperlipidemia, unspecified: Secondary | ICD-10-CM | POA: Diagnosis not present

## 2022-04-03 DIAGNOSIS — I10 Essential (primary) hypertension: Secondary | ICD-10-CM | POA: Diagnosis not present

## 2022-04-03 DIAGNOSIS — R1312 Dysphagia, oropharyngeal phase: Secondary | ICD-10-CM | POA: Diagnosis not present

## 2022-04-03 DIAGNOSIS — Z741 Need for assistance with personal care: Secondary | ICD-10-CM | POA: Diagnosis not present

## 2022-04-04 DIAGNOSIS — M6281 Muscle weakness (generalized): Secondary | ICD-10-CM | POA: Diagnosis not present

## 2022-04-04 DIAGNOSIS — R1312 Dysphagia, oropharyngeal phase: Secondary | ICD-10-CM | POA: Diagnosis not present

## 2022-04-04 DIAGNOSIS — Z741 Need for assistance with personal care: Secondary | ICD-10-CM | POA: Diagnosis not present

## 2022-04-04 DIAGNOSIS — E1151 Type 2 diabetes mellitus with diabetic peripheral angiopathy without gangrene: Secondary | ICD-10-CM | POA: Diagnosis not present

## 2022-04-04 DIAGNOSIS — J069 Acute upper respiratory infection, unspecified: Secondary | ICD-10-CM | POA: Diagnosis not present

## 2022-04-04 DIAGNOSIS — I480 Paroxysmal atrial fibrillation: Secondary | ICD-10-CM | POA: Diagnosis not present

## 2022-04-04 DIAGNOSIS — I2510 Atherosclerotic heart disease of native coronary artery without angina pectoris: Secondary | ICD-10-CM | POA: Diagnosis not present

## 2022-04-05 DIAGNOSIS — E1151 Type 2 diabetes mellitus with diabetic peripheral angiopathy without gangrene: Secondary | ICD-10-CM | POA: Diagnosis not present

## 2022-04-05 DIAGNOSIS — Z741 Need for assistance with personal care: Secondary | ICD-10-CM | POA: Diagnosis not present

## 2022-04-05 DIAGNOSIS — R1312 Dysphagia, oropharyngeal phase: Secondary | ICD-10-CM | POA: Diagnosis not present

## 2022-04-05 DIAGNOSIS — M6281 Muscle weakness (generalized): Secondary | ICD-10-CM | POA: Diagnosis not present

## 2022-04-08 DIAGNOSIS — I70209 Unspecified atherosclerosis of native arteries of extremities, unspecified extremity: Secondary | ICD-10-CM | POA: Diagnosis not present

## 2022-04-08 DIAGNOSIS — E559 Vitamin D deficiency, unspecified: Secondary | ICD-10-CM | POA: Diagnosis not present

## 2022-04-08 DIAGNOSIS — D649 Anemia, unspecified: Secondary | ICD-10-CM | POA: Diagnosis not present

## 2022-04-08 DIAGNOSIS — Z741 Need for assistance with personal care: Secondary | ICD-10-CM | POA: Diagnosis not present

## 2022-04-08 DIAGNOSIS — E1151 Type 2 diabetes mellitus with diabetic peripheral angiopathy without gangrene: Secondary | ICD-10-CM | POA: Diagnosis not present

## 2022-04-08 DIAGNOSIS — R1312 Dysphagia, oropharyngeal phase: Secondary | ICD-10-CM | POA: Diagnosis not present

## 2022-04-08 DIAGNOSIS — M6281 Muscle weakness (generalized): Secondary | ICD-10-CM | POA: Diagnosis not present

## 2022-04-08 DIAGNOSIS — I2510 Atherosclerotic heart disease of native coronary artery without angina pectoris: Secondary | ICD-10-CM | POA: Diagnosis not present

## 2022-04-08 DIAGNOSIS — N183 Chronic kidney disease, stage 3 unspecified: Secondary | ICD-10-CM | POA: Diagnosis not present

## 2022-04-08 DIAGNOSIS — I10 Essential (primary) hypertension: Secondary | ICD-10-CM | POA: Diagnosis not present

## 2022-04-08 DIAGNOSIS — I480 Paroxysmal atrial fibrillation: Secondary | ICD-10-CM | POA: Diagnosis not present

## 2022-04-09 DIAGNOSIS — R1312 Dysphagia, oropharyngeal phase: Secondary | ICD-10-CM | POA: Diagnosis not present

## 2022-04-09 DIAGNOSIS — Z741 Need for assistance with personal care: Secondary | ICD-10-CM | POA: Diagnosis not present

## 2022-04-09 DIAGNOSIS — M6281 Muscle weakness (generalized): Secondary | ICD-10-CM | POA: Diagnosis not present

## 2022-04-09 DIAGNOSIS — E1151 Type 2 diabetes mellitus with diabetic peripheral angiopathy without gangrene: Secondary | ICD-10-CM | POA: Diagnosis not present

## 2022-04-10 DIAGNOSIS — M6281 Muscle weakness (generalized): Secondary | ICD-10-CM | POA: Diagnosis not present

## 2022-04-10 DIAGNOSIS — Z741 Need for assistance with personal care: Secondary | ICD-10-CM | POA: Diagnosis not present

## 2022-04-10 DIAGNOSIS — E1151 Type 2 diabetes mellitus with diabetic peripheral angiopathy without gangrene: Secondary | ICD-10-CM | POA: Diagnosis not present

## 2022-04-10 DIAGNOSIS — R1312 Dysphagia, oropharyngeal phase: Secondary | ICD-10-CM | POA: Diagnosis not present

## 2022-04-11 DIAGNOSIS — E1151 Type 2 diabetes mellitus with diabetic peripheral angiopathy without gangrene: Secondary | ICD-10-CM | POA: Diagnosis not present

## 2022-04-11 DIAGNOSIS — R1312 Dysphagia, oropharyngeal phase: Secondary | ICD-10-CM | POA: Diagnosis not present

## 2022-04-11 DIAGNOSIS — Z741 Need for assistance with personal care: Secondary | ICD-10-CM | POA: Diagnosis not present

## 2022-04-11 DIAGNOSIS — M6281 Muscle weakness (generalized): Secondary | ICD-10-CM | POA: Diagnosis not present

## 2022-04-12 DIAGNOSIS — Z741 Need for assistance with personal care: Secondary | ICD-10-CM | POA: Diagnosis not present

## 2022-04-12 DIAGNOSIS — E1151 Type 2 diabetes mellitus with diabetic peripheral angiopathy without gangrene: Secondary | ICD-10-CM | POA: Diagnosis not present

## 2022-04-12 DIAGNOSIS — M6281 Muscle weakness (generalized): Secondary | ICD-10-CM | POA: Diagnosis not present

## 2022-04-12 DIAGNOSIS — R1312 Dysphagia, oropharyngeal phase: Secondary | ICD-10-CM | POA: Diagnosis not present

## 2022-04-14 DIAGNOSIS — Z741 Need for assistance with personal care: Secondary | ICD-10-CM | POA: Diagnosis not present

## 2022-04-14 DIAGNOSIS — E1151 Type 2 diabetes mellitus with diabetic peripheral angiopathy without gangrene: Secondary | ICD-10-CM | POA: Diagnosis not present

## 2022-04-14 DIAGNOSIS — M6281 Muscle weakness (generalized): Secondary | ICD-10-CM | POA: Diagnosis not present

## 2022-04-14 DIAGNOSIS — R1312 Dysphagia, oropharyngeal phase: Secondary | ICD-10-CM | POA: Diagnosis not present

## 2022-04-15 DIAGNOSIS — Z741 Need for assistance with personal care: Secondary | ICD-10-CM | POA: Diagnosis not present

## 2022-04-15 DIAGNOSIS — R1312 Dysphagia, oropharyngeal phase: Secondary | ICD-10-CM | POA: Diagnosis not present

## 2022-04-15 DIAGNOSIS — M6281 Muscle weakness (generalized): Secondary | ICD-10-CM | POA: Diagnosis not present

## 2022-04-15 DIAGNOSIS — E1151 Type 2 diabetes mellitus with diabetic peripheral angiopathy without gangrene: Secondary | ICD-10-CM | POA: Diagnosis not present

## 2022-04-16 DIAGNOSIS — Z741 Need for assistance with personal care: Secondary | ICD-10-CM | POA: Diagnosis not present

## 2022-04-16 DIAGNOSIS — R1312 Dysphagia, oropharyngeal phase: Secondary | ICD-10-CM | POA: Diagnosis not present

## 2022-04-16 DIAGNOSIS — E119 Type 2 diabetes mellitus without complications: Secondary | ICD-10-CM | POA: Diagnosis not present

## 2022-04-16 DIAGNOSIS — E1151 Type 2 diabetes mellitus with diabetic peripheral angiopathy without gangrene: Secondary | ICD-10-CM | POA: Diagnosis not present

## 2022-04-16 DIAGNOSIS — L603 Nail dystrophy: Secondary | ICD-10-CM | POA: Diagnosis not present

## 2022-04-16 DIAGNOSIS — M6281 Muscle weakness (generalized): Secondary | ICD-10-CM | POA: Diagnosis not present

## 2022-04-17 DIAGNOSIS — M6281 Muscle weakness (generalized): Secondary | ICD-10-CM | POA: Diagnosis not present

## 2022-04-17 DIAGNOSIS — E1151 Type 2 diabetes mellitus with diabetic peripheral angiopathy without gangrene: Secondary | ICD-10-CM | POA: Diagnosis not present

## 2022-04-17 DIAGNOSIS — Z741 Need for assistance with personal care: Secondary | ICD-10-CM | POA: Diagnosis not present

## 2022-04-17 DIAGNOSIS — I10 Essential (primary) hypertension: Secondary | ICD-10-CM | POA: Diagnosis not present

## 2022-04-17 DIAGNOSIS — R1312 Dysphagia, oropharyngeal phase: Secondary | ICD-10-CM | POA: Diagnosis not present

## 2022-04-19 DIAGNOSIS — Z741 Need for assistance with personal care: Secondary | ICD-10-CM | POA: Diagnosis not present

## 2022-04-19 DIAGNOSIS — E1151 Type 2 diabetes mellitus with diabetic peripheral angiopathy without gangrene: Secondary | ICD-10-CM | POA: Diagnosis not present

## 2022-04-19 DIAGNOSIS — M6281 Muscle weakness (generalized): Secondary | ICD-10-CM | POA: Diagnosis not present

## 2022-04-19 DIAGNOSIS — R1312 Dysphagia, oropharyngeal phase: Secondary | ICD-10-CM | POA: Diagnosis not present

## 2022-04-22 DIAGNOSIS — M6281 Muscle weakness (generalized): Secondary | ICD-10-CM | POA: Diagnosis not present

## 2022-04-22 DIAGNOSIS — R1312 Dysphagia, oropharyngeal phase: Secondary | ICD-10-CM | POA: Diagnosis not present

## 2022-04-22 DIAGNOSIS — E1151 Type 2 diabetes mellitus with diabetic peripheral angiopathy without gangrene: Secondary | ICD-10-CM | POA: Diagnosis not present

## 2022-04-22 DIAGNOSIS — Z741 Need for assistance with personal care: Secondary | ICD-10-CM | POA: Diagnosis not present

## 2022-04-23 DIAGNOSIS — M6281 Muscle weakness (generalized): Secondary | ICD-10-CM | POA: Diagnosis not present

## 2022-04-23 DIAGNOSIS — I10 Essential (primary) hypertension: Secondary | ICD-10-CM | POA: Diagnosis not present

## 2022-04-23 DIAGNOSIS — R1312 Dysphagia, oropharyngeal phase: Secondary | ICD-10-CM | POA: Diagnosis not present

## 2022-04-23 DIAGNOSIS — E1151 Type 2 diabetes mellitus with diabetic peripheral angiopathy without gangrene: Secondary | ICD-10-CM | POA: Diagnosis not present

## 2022-04-23 DIAGNOSIS — Z741 Need for assistance with personal care: Secondary | ICD-10-CM | POA: Diagnosis not present

## 2022-04-24 DIAGNOSIS — Z741 Need for assistance with personal care: Secondary | ICD-10-CM | POA: Diagnosis not present

## 2022-04-24 DIAGNOSIS — E1151 Type 2 diabetes mellitus with diabetic peripheral angiopathy without gangrene: Secondary | ICD-10-CM | POA: Diagnosis not present

## 2022-04-24 DIAGNOSIS — R1312 Dysphagia, oropharyngeal phase: Secondary | ICD-10-CM | POA: Diagnosis not present

## 2022-04-24 DIAGNOSIS — M6281 Muscle weakness (generalized): Secondary | ICD-10-CM | POA: Diagnosis not present

## 2022-04-25 DIAGNOSIS — M6281 Muscle weakness (generalized): Secondary | ICD-10-CM | POA: Diagnosis not present

## 2022-04-25 DIAGNOSIS — E1151 Type 2 diabetes mellitus with diabetic peripheral angiopathy without gangrene: Secondary | ICD-10-CM | POA: Diagnosis not present

## 2022-04-25 DIAGNOSIS — R1312 Dysphagia, oropharyngeal phase: Secondary | ICD-10-CM | POA: Diagnosis not present

## 2022-04-25 DIAGNOSIS — Z741 Need for assistance with personal care: Secondary | ICD-10-CM | POA: Diagnosis not present

## 2022-04-26 DIAGNOSIS — Z741 Need for assistance with personal care: Secondary | ICD-10-CM | POA: Diagnosis not present

## 2022-04-26 DIAGNOSIS — R1312 Dysphagia, oropharyngeal phase: Secondary | ICD-10-CM | POA: Diagnosis not present

## 2022-04-26 DIAGNOSIS — E1151 Type 2 diabetes mellitus with diabetic peripheral angiopathy without gangrene: Secondary | ICD-10-CM | POA: Diagnosis not present

## 2022-04-26 DIAGNOSIS — M6281 Muscle weakness (generalized): Secondary | ICD-10-CM | POA: Diagnosis not present

## 2022-05-01 DIAGNOSIS — E1151 Type 2 diabetes mellitus with diabetic peripheral angiopathy without gangrene: Secondary | ICD-10-CM | POA: Diagnosis not present

## 2022-05-01 DIAGNOSIS — R1312 Dysphagia, oropharyngeal phase: Secondary | ICD-10-CM | POA: Diagnosis not present

## 2022-05-02 DIAGNOSIS — R1312 Dysphagia, oropharyngeal phase: Secondary | ICD-10-CM | POA: Diagnosis not present

## 2022-05-02 DIAGNOSIS — E1151 Type 2 diabetes mellitus with diabetic peripheral angiopathy without gangrene: Secondary | ICD-10-CM | POA: Diagnosis not present

## 2022-05-03 DIAGNOSIS — I480 Paroxysmal atrial fibrillation: Secondary | ICD-10-CM | POA: Diagnosis not present

## 2022-05-03 DIAGNOSIS — E1151 Type 2 diabetes mellitus with diabetic peripheral angiopathy without gangrene: Secondary | ICD-10-CM | POA: Diagnosis not present

## 2022-05-03 DIAGNOSIS — R1319 Other dysphagia: Secondary | ICD-10-CM | POA: Diagnosis not present

## 2022-05-03 DIAGNOSIS — I2510 Atherosclerotic heart disease of native coronary artery without angina pectoris: Secondary | ICD-10-CM | POA: Diagnosis not present

## 2022-05-03 DIAGNOSIS — K219 Gastro-esophageal reflux disease without esophagitis: Secondary | ICD-10-CM | POA: Diagnosis not present

## 2022-05-03 DIAGNOSIS — E559 Vitamin D deficiency, unspecified: Secondary | ICD-10-CM | POA: Diagnosis not present

## 2022-05-03 DIAGNOSIS — D649 Anemia, unspecified: Secondary | ICD-10-CM | POA: Diagnosis not present

## 2022-05-06 DIAGNOSIS — E1151 Type 2 diabetes mellitus with diabetic peripheral angiopathy without gangrene: Secondary | ICD-10-CM | POA: Diagnosis not present

## 2022-05-06 DIAGNOSIS — R1312 Dysphagia, oropharyngeal phase: Secondary | ICD-10-CM | POA: Diagnosis not present

## 2022-05-07 DIAGNOSIS — E1151 Type 2 diabetes mellitus with diabetic peripheral angiopathy without gangrene: Secondary | ICD-10-CM | POA: Diagnosis not present

## 2022-05-07 DIAGNOSIS — R1312 Dysphagia, oropharyngeal phase: Secondary | ICD-10-CM | POA: Diagnosis not present

## 2022-05-08 DIAGNOSIS — E1151 Type 2 diabetes mellitus with diabetic peripheral angiopathy without gangrene: Secondary | ICD-10-CM | POA: Diagnosis not present

## 2022-05-08 DIAGNOSIS — R1312 Dysphagia, oropharyngeal phase: Secondary | ICD-10-CM | POA: Diagnosis not present

## 2022-05-11 DIAGNOSIS — R1312 Dysphagia, oropharyngeal phase: Secondary | ICD-10-CM | POA: Diagnosis not present

## 2022-05-11 DIAGNOSIS — E1151 Type 2 diabetes mellitus with diabetic peripheral angiopathy without gangrene: Secondary | ICD-10-CM | POA: Diagnosis not present

## 2022-05-13 DIAGNOSIS — R1312 Dysphagia, oropharyngeal phase: Secondary | ICD-10-CM | POA: Diagnosis not present

## 2022-05-13 DIAGNOSIS — E1151 Type 2 diabetes mellitus with diabetic peripheral angiopathy without gangrene: Secondary | ICD-10-CM | POA: Diagnosis not present

## 2022-05-14 DIAGNOSIS — N183 Chronic kidney disease, stage 3 unspecified: Secondary | ICD-10-CM | POA: Diagnosis not present

## 2022-05-14 DIAGNOSIS — D649 Anemia, unspecified: Secondary | ICD-10-CM | POA: Diagnosis not present

## 2022-05-14 DIAGNOSIS — E559 Vitamin D deficiency, unspecified: Secondary | ICD-10-CM | POA: Diagnosis not present

## 2022-05-14 DIAGNOSIS — I10 Essential (primary) hypertension: Secondary | ICD-10-CM | POA: Diagnosis not present

## 2022-05-14 DIAGNOSIS — E785 Hyperlipidemia, unspecified: Secondary | ICD-10-CM | POA: Diagnosis not present

## 2022-05-14 DIAGNOSIS — I152 Hypertension secondary to endocrine disorders: Secondary | ICD-10-CM | POA: Diagnosis not present

## 2022-05-14 DIAGNOSIS — E1159 Type 2 diabetes mellitus with other circulatory complications: Secondary | ICD-10-CM | POA: Diagnosis not present

## 2022-05-14 DIAGNOSIS — I480 Paroxysmal atrial fibrillation: Secondary | ICD-10-CM | POA: Diagnosis not present

## 2022-05-14 DIAGNOSIS — I2510 Atherosclerotic heart disease of native coronary artery without angina pectoris: Secondary | ICD-10-CM | POA: Diagnosis not present

## 2022-05-14 DIAGNOSIS — I70209 Unspecified atherosclerosis of native arteries of extremities, unspecified extremity: Secondary | ICD-10-CM | POA: Diagnosis not present

## 2022-05-14 DIAGNOSIS — E1151 Type 2 diabetes mellitus with diabetic peripheral angiopathy without gangrene: Secondary | ICD-10-CM | POA: Diagnosis not present

## 2022-05-15 DIAGNOSIS — E1151 Type 2 diabetes mellitus with diabetic peripheral angiopathy without gangrene: Secondary | ICD-10-CM | POA: Diagnosis not present

## 2022-05-15 DIAGNOSIS — I10 Essential (primary) hypertension: Secondary | ICD-10-CM | POA: Diagnosis not present

## 2022-05-15 DIAGNOSIS — R1312 Dysphagia, oropharyngeal phase: Secondary | ICD-10-CM | POA: Diagnosis not present

## 2022-05-20 DIAGNOSIS — R1312 Dysphagia, oropharyngeal phase: Secondary | ICD-10-CM | POA: Diagnosis not present

## 2022-05-20 DIAGNOSIS — E1151 Type 2 diabetes mellitus with diabetic peripheral angiopathy without gangrene: Secondary | ICD-10-CM | POA: Diagnosis not present

## 2022-05-21 DIAGNOSIS — E1151 Type 2 diabetes mellitus with diabetic peripheral angiopathy without gangrene: Secondary | ICD-10-CM | POA: Diagnosis not present

## 2022-05-21 DIAGNOSIS — R1312 Dysphagia, oropharyngeal phase: Secondary | ICD-10-CM | POA: Diagnosis not present

## 2022-05-23 DIAGNOSIS — E1151 Type 2 diabetes mellitus with diabetic peripheral angiopathy without gangrene: Secondary | ICD-10-CM | POA: Diagnosis not present

## 2022-05-23 DIAGNOSIS — R1312 Dysphagia, oropharyngeal phase: Secondary | ICD-10-CM | POA: Diagnosis not present

## 2022-05-28 DIAGNOSIS — R1312 Dysphagia, oropharyngeal phase: Secondary | ICD-10-CM | POA: Diagnosis not present

## 2022-05-28 DIAGNOSIS — E1151 Type 2 diabetes mellitus with diabetic peripheral angiopathy without gangrene: Secondary | ICD-10-CM | POA: Diagnosis not present

## 2022-05-29 DIAGNOSIS — E1151 Type 2 diabetes mellitus with diabetic peripheral angiopathy without gangrene: Secondary | ICD-10-CM | POA: Diagnosis not present

## 2022-05-29 DIAGNOSIS — R1312 Dysphagia, oropharyngeal phase: Secondary | ICD-10-CM | POA: Diagnosis not present

## 2022-05-30 DIAGNOSIS — R1312 Dysphagia, oropharyngeal phase: Secondary | ICD-10-CM | POA: Diagnosis not present

## 2022-05-30 DIAGNOSIS — E1151 Type 2 diabetes mellitus with diabetic peripheral angiopathy without gangrene: Secondary | ICD-10-CM | POA: Diagnosis not present

## 2022-05-31 DIAGNOSIS — E785 Hyperlipidemia, unspecified: Secondary | ICD-10-CM | POA: Diagnosis not present

## 2022-05-31 DIAGNOSIS — I10 Essential (primary) hypertension: Secondary | ICD-10-CM | POA: Diagnosis not present

## 2022-05-31 DIAGNOSIS — D649 Anemia, unspecified: Secondary | ICD-10-CM | POA: Diagnosis not present

## 2022-05-31 DIAGNOSIS — R1319 Other dysphagia: Secondary | ICD-10-CM | POA: Diagnosis not present

## 2022-05-31 DIAGNOSIS — R059 Cough, unspecified: Secondary | ICD-10-CM | POA: Diagnosis not present

## 2022-05-31 DIAGNOSIS — E1151 Type 2 diabetes mellitus with diabetic peripheral angiopathy without gangrene: Secondary | ICD-10-CM | POA: Diagnosis not present

## 2022-05-31 DIAGNOSIS — E559 Vitamin D deficiency, unspecified: Secondary | ICD-10-CM | POA: Diagnosis not present

## 2022-05-31 DIAGNOSIS — I672 Cerebral atherosclerosis: Secondary | ICD-10-CM | POA: Diagnosis not present

## 2022-06-03 DIAGNOSIS — E1151 Type 2 diabetes mellitus with diabetic peripheral angiopathy without gangrene: Secondary | ICD-10-CM | POA: Diagnosis not present

## 2022-06-03 DIAGNOSIS — R1312 Dysphagia, oropharyngeal phase: Secondary | ICD-10-CM | POA: Diagnosis not present

## 2022-06-05 DIAGNOSIS — R1312 Dysphagia, oropharyngeal phase: Secondary | ICD-10-CM | POA: Diagnosis not present

## 2022-06-05 DIAGNOSIS — E1151 Type 2 diabetes mellitus with diabetic peripheral angiopathy without gangrene: Secondary | ICD-10-CM | POA: Diagnosis not present

## 2022-06-06 DIAGNOSIS — I672 Cerebral atherosclerosis: Secondary | ICD-10-CM | POA: Diagnosis not present

## 2022-06-06 DIAGNOSIS — D649 Anemia, unspecified: Secondary | ICD-10-CM | POA: Diagnosis not present

## 2022-06-06 DIAGNOSIS — E559 Vitamin D deficiency, unspecified: Secondary | ICD-10-CM | POA: Diagnosis not present

## 2022-06-06 DIAGNOSIS — R1319 Other dysphagia: Secondary | ICD-10-CM | POA: Diagnosis not present

## 2022-06-06 DIAGNOSIS — I10 Essential (primary) hypertension: Secondary | ICD-10-CM | POA: Diagnosis not present

## 2022-06-06 DIAGNOSIS — R059 Cough, unspecified: Secondary | ICD-10-CM | POA: Diagnosis not present

## 2022-06-14 DIAGNOSIS — E1151 Type 2 diabetes mellitus with diabetic peripheral angiopathy without gangrene: Secondary | ICD-10-CM | POA: Diagnosis not present

## 2022-06-14 DIAGNOSIS — I10 Essential (primary) hypertension: Secondary | ICD-10-CM | POA: Diagnosis not present

## 2022-06-18 DIAGNOSIS — I2510 Atherosclerotic heart disease of native coronary artery without angina pectoris: Secondary | ICD-10-CM | POA: Diagnosis not present

## 2022-06-18 DIAGNOSIS — E785 Hyperlipidemia, unspecified: Secondary | ICD-10-CM | POA: Diagnosis not present

## 2022-06-18 DIAGNOSIS — I70209 Unspecified atherosclerosis of native arteries of extremities, unspecified extremity: Secondary | ICD-10-CM | POA: Diagnosis not present

## 2022-06-18 DIAGNOSIS — I10 Essential (primary) hypertension: Secondary | ICD-10-CM | POA: Diagnosis not present

## 2022-06-18 DIAGNOSIS — E559 Vitamin D deficiency, unspecified: Secondary | ICD-10-CM | POA: Diagnosis not present

## 2022-06-18 DIAGNOSIS — E1151 Type 2 diabetes mellitus with diabetic peripheral angiopathy without gangrene: Secondary | ICD-10-CM | POA: Diagnosis not present

## 2022-06-18 DIAGNOSIS — I151 Hypertension secondary to other renal disorders: Secondary | ICD-10-CM | POA: Diagnosis not present

## 2022-06-18 DIAGNOSIS — E1159 Type 2 diabetes mellitus with other circulatory complications: Secondary | ICD-10-CM | POA: Diagnosis not present

## 2022-06-18 DIAGNOSIS — I480 Paroxysmal atrial fibrillation: Secondary | ICD-10-CM | POA: Diagnosis not present

## 2022-06-18 DIAGNOSIS — N183 Chronic kidney disease, stage 3 unspecified: Secondary | ICD-10-CM | POA: Diagnosis not present

## 2022-06-18 DIAGNOSIS — L603 Nail dystrophy: Secondary | ICD-10-CM | POA: Diagnosis not present

## 2022-06-18 DIAGNOSIS — D649 Anemia, unspecified: Secondary | ICD-10-CM | POA: Diagnosis not present

## 2022-06-27 DIAGNOSIS — R059 Cough, unspecified: Secondary | ICD-10-CM | POA: Diagnosis not present

## 2022-06-27 DIAGNOSIS — I10 Essential (primary) hypertension: Secondary | ICD-10-CM | POA: Diagnosis not present

## 2022-06-27 DIAGNOSIS — J302 Other seasonal allergic rhinitis: Secondary | ICD-10-CM | POA: Diagnosis not present

## 2022-06-28 DIAGNOSIS — E1151 Type 2 diabetes mellitus with diabetic peripheral angiopathy without gangrene: Secondary | ICD-10-CM | POA: Diagnosis not present

## 2022-06-28 DIAGNOSIS — J302 Other seasonal allergic rhinitis: Secondary | ICD-10-CM | POA: Diagnosis not present

## 2022-06-28 DIAGNOSIS — I480 Paroxysmal atrial fibrillation: Secondary | ICD-10-CM | POA: Diagnosis not present

## 2022-06-28 DIAGNOSIS — E559 Vitamin D deficiency, unspecified: Secondary | ICD-10-CM | POA: Diagnosis not present

## 2022-06-28 DIAGNOSIS — K219 Gastro-esophageal reflux disease without esophagitis: Secondary | ICD-10-CM | POA: Diagnosis not present

## 2022-06-28 DIAGNOSIS — I2510 Atherosclerotic heart disease of native coronary artery without angina pectoris: Secondary | ICD-10-CM | POA: Diagnosis not present

## 2022-06-28 DIAGNOSIS — D649 Anemia, unspecified: Secondary | ICD-10-CM | POA: Diagnosis not present

## 2022-07-01 DIAGNOSIS — M6281 Muscle weakness (generalized): Secondary | ICD-10-CM | POA: Diagnosis not present

## 2022-07-01 DIAGNOSIS — E1151 Type 2 diabetes mellitus with diabetic peripheral angiopathy without gangrene: Secondary | ICD-10-CM | POA: Diagnosis not present

## 2022-07-04 DIAGNOSIS — E1151 Type 2 diabetes mellitus with diabetic peripheral angiopathy without gangrene: Secondary | ICD-10-CM | POA: Diagnosis not present

## 2022-07-04 DIAGNOSIS — M6281 Muscle weakness (generalized): Secondary | ICD-10-CM | POA: Diagnosis not present

## 2022-07-05 DIAGNOSIS — I10 Essential (primary) hypertension: Secondary | ICD-10-CM | POA: Diagnosis not present

## 2022-07-05 DIAGNOSIS — E1151 Type 2 diabetes mellitus with diabetic peripheral angiopathy without gangrene: Secondary | ICD-10-CM | POA: Diagnosis not present

## 2022-07-06 DIAGNOSIS — E1151 Type 2 diabetes mellitus with diabetic peripheral angiopathy without gangrene: Secondary | ICD-10-CM | POA: Diagnosis not present

## 2022-07-06 DIAGNOSIS — M6281 Muscle weakness (generalized): Secondary | ICD-10-CM | POA: Diagnosis not present

## 2022-07-08 DIAGNOSIS — E1151 Type 2 diabetes mellitus with diabetic peripheral angiopathy without gangrene: Secondary | ICD-10-CM | POA: Diagnosis not present

## 2022-07-08 DIAGNOSIS — M6281 Muscle weakness (generalized): Secondary | ICD-10-CM | POA: Diagnosis not present

## 2022-07-09 DIAGNOSIS — M6281 Muscle weakness (generalized): Secondary | ICD-10-CM | POA: Diagnosis not present

## 2022-07-09 DIAGNOSIS — E1151 Type 2 diabetes mellitus with diabetic peripheral angiopathy without gangrene: Secondary | ICD-10-CM | POA: Diagnosis not present

## 2022-07-10 DIAGNOSIS — M6281 Muscle weakness (generalized): Secondary | ICD-10-CM | POA: Diagnosis not present

## 2022-07-10 DIAGNOSIS — E1151 Type 2 diabetes mellitus with diabetic peripheral angiopathy without gangrene: Secondary | ICD-10-CM | POA: Diagnosis not present

## 2022-07-11 DIAGNOSIS — I480 Paroxysmal atrial fibrillation: Secondary | ICD-10-CM | POA: Diagnosis not present

## 2022-07-11 DIAGNOSIS — K219 Gastro-esophageal reflux disease without esophagitis: Secondary | ICD-10-CM | POA: Diagnosis not present

## 2022-07-11 DIAGNOSIS — J302 Other seasonal allergic rhinitis: Secondary | ICD-10-CM | POA: Diagnosis not present

## 2022-07-11 DIAGNOSIS — E1151 Type 2 diabetes mellitus with diabetic peripheral angiopathy without gangrene: Secondary | ICD-10-CM | POA: Diagnosis not present

## 2022-07-11 DIAGNOSIS — J209 Acute bronchitis, unspecified: Secondary | ICD-10-CM | POA: Diagnosis not present

## 2022-07-11 DIAGNOSIS — D649 Anemia, unspecified: Secondary | ICD-10-CM | POA: Diagnosis not present

## 2022-07-11 DIAGNOSIS — E559 Vitamin D deficiency, unspecified: Secondary | ICD-10-CM | POA: Diagnosis not present

## 2022-07-11 DIAGNOSIS — I2510 Atherosclerotic heart disease of native coronary artery without angina pectoris: Secondary | ICD-10-CM | POA: Diagnosis not present

## 2022-07-14 DIAGNOSIS — R059 Cough, unspecified: Secondary | ICD-10-CM | POA: Diagnosis not present

## 2022-07-16 DIAGNOSIS — M6281 Muscle weakness (generalized): Secondary | ICD-10-CM | POA: Diagnosis not present

## 2022-07-16 DIAGNOSIS — E1151 Type 2 diabetes mellitus with diabetic peripheral angiopathy without gangrene: Secondary | ICD-10-CM | POA: Diagnosis not present

## 2022-07-18 DIAGNOSIS — E1151 Type 2 diabetes mellitus with diabetic peripheral angiopathy without gangrene: Secondary | ICD-10-CM | POA: Diagnosis not present

## 2022-07-18 DIAGNOSIS — M6281 Muscle weakness (generalized): Secondary | ICD-10-CM | POA: Diagnosis not present

## 2022-07-21 DIAGNOSIS — M6281 Muscle weakness (generalized): Secondary | ICD-10-CM | POA: Diagnosis not present

## 2022-07-21 DIAGNOSIS — E1151 Type 2 diabetes mellitus with diabetic peripheral angiopathy without gangrene: Secondary | ICD-10-CM | POA: Diagnosis not present

## 2022-07-22 DIAGNOSIS — M6281 Muscle weakness (generalized): Secondary | ICD-10-CM | POA: Diagnosis not present

## 2022-07-22 DIAGNOSIS — E1151 Type 2 diabetes mellitus with diabetic peripheral angiopathy without gangrene: Secondary | ICD-10-CM | POA: Diagnosis not present

## 2022-07-23 DIAGNOSIS — I2510 Atherosclerotic heart disease of native coronary artery without angina pectoris: Secondary | ICD-10-CM | POA: Diagnosis not present

## 2022-07-23 DIAGNOSIS — E559 Vitamin D deficiency, unspecified: Secondary | ICD-10-CM | POA: Diagnosis not present

## 2022-07-23 DIAGNOSIS — N183 Chronic kidney disease, stage 3 unspecified: Secondary | ICD-10-CM | POA: Diagnosis not present

## 2022-07-23 DIAGNOSIS — I70209 Unspecified atherosclerosis of native arteries of extremities, unspecified extremity: Secondary | ICD-10-CM | POA: Diagnosis not present

## 2022-07-23 DIAGNOSIS — I10 Essential (primary) hypertension: Secondary | ICD-10-CM | POA: Diagnosis not present

## 2022-07-23 DIAGNOSIS — E1151 Type 2 diabetes mellitus with diabetic peripheral angiopathy without gangrene: Secondary | ICD-10-CM | POA: Diagnosis not present

## 2022-07-23 DIAGNOSIS — I480 Paroxysmal atrial fibrillation: Secondary | ICD-10-CM | POA: Diagnosis not present

## 2022-07-23 DIAGNOSIS — E785 Hyperlipidemia, unspecified: Secondary | ICD-10-CM | POA: Diagnosis not present

## 2022-07-23 DIAGNOSIS — I129 Hypertensive chronic kidney disease with stage 1 through stage 4 chronic kidney disease, or unspecified chronic kidney disease: Secondary | ICD-10-CM | POA: Diagnosis not present

## 2022-07-24 DIAGNOSIS — E1151 Type 2 diabetes mellitus with diabetic peripheral angiopathy without gangrene: Secondary | ICD-10-CM | POA: Diagnosis not present

## 2022-07-24 DIAGNOSIS — M6281 Muscle weakness (generalized): Secondary | ICD-10-CM | POA: Diagnosis not present

## 2022-07-26 DIAGNOSIS — F32A Depression, unspecified: Secondary | ICD-10-CM | POA: Diagnosis not present

## 2022-07-26 DIAGNOSIS — I10 Essential (primary) hypertension: Secondary | ICD-10-CM | POA: Diagnosis not present

## 2022-07-26 DIAGNOSIS — K219 Gastro-esophageal reflux disease without esophagitis: Secondary | ICD-10-CM | POA: Diagnosis not present

## 2022-07-26 DIAGNOSIS — I480 Paroxysmal atrial fibrillation: Secondary | ICD-10-CM | POA: Diagnosis not present

## 2022-07-26 DIAGNOSIS — I2510 Atherosclerotic heart disease of native coronary artery without angina pectoris: Secondary | ICD-10-CM | POA: Diagnosis not present

## 2022-07-26 DIAGNOSIS — E1151 Type 2 diabetes mellitus with diabetic peripheral angiopathy without gangrene: Secondary | ICD-10-CM | POA: Diagnosis not present

## 2022-07-26 DIAGNOSIS — E785 Hyperlipidemia, unspecified: Secondary | ICD-10-CM | POA: Diagnosis not present

## 2022-07-26 DIAGNOSIS — M6281 Muscle weakness (generalized): Secondary | ICD-10-CM | POA: Diagnosis not present

## 2022-07-26 DIAGNOSIS — J302 Other seasonal allergic rhinitis: Secondary | ICD-10-CM | POA: Diagnosis not present

## 2022-07-29 DIAGNOSIS — E1151 Type 2 diabetes mellitus with diabetic peripheral angiopathy without gangrene: Secondary | ICD-10-CM | POA: Diagnosis not present

## 2022-07-29 DIAGNOSIS — M6281 Muscle weakness (generalized): Secondary | ICD-10-CM | POA: Diagnosis not present

## 2022-07-30 DIAGNOSIS — M6281 Muscle weakness (generalized): Secondary | ICD-10-CM | POA: Diagnosis not present

## 2022-07-30 DIAGNOSIS — E1151 Type 2 diabetes mellitus with diabetic peripheral angiopathy without gangrene: Secondary | ICD-10-CM | POA: Diagnosis not present

## 2022-08-01 DIAGNOSIS — E1151 Type 2 diabetes mellitus with diabetic peripheral angiopathy without gangrene: Secondary | ICD-10-CM | POA: Diagnosis not present

## 2022-08-01 DIAGNOSIS — M6281 Muscle weakness (generalized): Secondary | ICD-10-CM | POA: Diagnosis not present

## 2022-08-03 DIAGNOSIS — E1151 Type 2 diabetes mellitus with diabetic peripheral angiopathy without gangrene: Secondary | ICD-10-CM | POA: Diagnosis not present

## 2022-08-05 DIAGNOSIS — I10 Essential (primary) hypertension: Secondary | ICD-10-CM | POA: Diagnosis not present

## 2022-08-05 DIAGNOSIS — E1151 Type 2 diabetes mellitus with diabetic peripheral angiopathy without gangrene: Secondary | ICD-10-CM | POA: Diagnosis not present

## 2022-08-06 DIAGNOSIS — M6281 Muscle weakness (generalized): Secondary | ICD-10-CM | POA: Diagnosis not present

## 2022-08-06 DIAGNOSIS — E1151 Type 2 diabetes mellitus with diabetic peripheral angiopathy without gangrene: Secondary | ICD-10-CM | POA: Diagnosis not present

## 2022-08-07 DIAGNOSIS — E1151 Type 2 diabetes mellitus with diabetic peripheral angiopathy without gangrene: Secondary | ICD-10-CM | POA: Diagnosis not present

## 2022-08-07 DIAGNOSIS — M6281 Muscle weakness (generalized): Secondary | ICD-10-CM | POA: Diagnosis not present

## 2022-08-08 DIAGNOSIS — E1151 Type 2 diabetes mellitus with diabetic peripheral angiopathy without gangrene: Secondary | ICD-10-CM | POA: Diagnosis not present

## 2022-08-08 DIAGNOSIS — M6281 Muscle weakness (generalized): Secondary | ICD-10-CM | POA: Diagnosis not present

## 2022-08-13 DIAGNOSIS — M6281 Muscle weakness (generalized): Secondary | ICD-10-CM | POA: Diagnosis not present

## 2022-08-13 DIAGNOSIS — E1151 Type 2 diabetes mellitus with diabetic peripheral angiopathy without gangrene: Secondary | ICD-10-CM | POA: Diagnosis not present

## 2022-08-14 DIAGNOSIS — M6281 Muscle weakness (generalized): Secondary | ICD-10-CM | POA: Diagnosis not present

## 2022-08-14 DIAGNOSIS — E1151 Type 2 diabetes mellitus with diabetic peripheral angiopathy without gangrene: Secondary | ICD-10-CM | POA: Diagnosis not present

## 2022-08-15 DIAGNOSIS — E1151 Type 2 diabetes mellitus with diabetic peripheral angiopathy without gangrene: Secondary | ICD-10-CM | POA: Diagnosis not present

## 2022-08-15 DIAGNOSIS — M6281 Muscle weakness (generalized): Secondary | ICD-10-CM | POA: Diagnosis not present

## 2022-08-20 DIAGNOSIS — I70209 Unspecified atherosclerosis of native arteries of extremities, unspecified extremity: Secondary | ICD-10-CM | POA: Diagnosis not present

## 2022-08-20 DIAGNOSIS — I151 Hypertension secondary to other renal disorders: Secondary | ICD-10-CM | POA: Diagnosis not present

## 2022-08-20 DIAGNOSIS — E785 Hyperlipidemia, unspecified: Secondary | ICD-10-CM | POA: Diagnosis not present

## 2022-08-20 DIAGNOSIS — I480 Paroxysmal atrial fibrillation: Secondary | ICD-10-CM | POA: Diagnosis not present

## 2022-08-20 DIAGNOSIS — D649 Anemia, unspecified: Secondary | ICD-10-CM | POA: Diagnosis not present

## 2022-08-20 DIAGNOSIS — N183 Chronic kidney disease, stage 3 unspecified: Secondary | ICD-10-CM | POA: Diagnosis not present

## 2022-08-20 DIAGNOSIS — E1151 Type 2 diabetes mellitus with diabetic peripheral angiopathy without gangrene: Secondary | ICD-10-CM | POA: Diagnosis not present

## 2022-08-20 DIAGNOSIS — E559 Vitamin D deficiency, unspecified: Secondary | ICD-10-CM | POA: Diagnosis not present

## 2022-08-20 DIAGNOSIS — I10 Essential (primary) hypertension: Secondary | ICD-10-CM | POA: Diagnosis not present

## 2022-08-20 DIAGNOSIS — I2510 Atherosclerotic heart disease of native coronary artery without angina pectoris: Secondary | ICD-10-CM | POA: Diagnosis not present

## 2022-08-23 DIAGNOSIS — K219 Gastro-esophageal reflux disease without esophagitis: Secondary | ICD-10-CM | POA: Diagnosis not present

## 2022-08-23 DIAGNOSIS — F32A Depression, unspecified: Secondary | ICD-10-CM | POA: Diagnosis not present

## 2022-08-23 DIAGNOSIS — I2510 Atherosclerotic heart disease of native coronary artery without angina pectoris: Secondary | ICD-10-CM | POA: Diagnosis not present

## 2022-08-23 DIAGNOSIS — I480 Paroxysmal atrial fibrillation: Secondary | ICD-10-CM | POA: Diagnosis not present

## 2022-08-23 DIAGNOSIS — J302 Other seasonal allergic rhinitis: Secondary | ICD-10-CM | POA: Diagnosis not present

## 2022-08-23 DIAGNOSIS — R5381 Other malaise: Secondary | ICD-10-CM | POA: Diagnosis not present

## 2022-08-29 DIAGNOSIS — K219 Gastro-esophageal reflux disease without esophagitis: Secondary | ICD-10-CM | POA: Diagnosis not present

## 2022-08-29 DIAGNOSIS — R5381 Other malaise: Secondary | ICD-10-CM | POA: Diagnosis not present

## 2022-08-29 DIAGNOSIS — J302 Other seasonal allergic rhinitis: Secondary | ICD-10-CM | POA: Diagnosis not present

## 2022-08-29 DIAGNOSIS — I480 Paroxysmal atrial fibrillation: Secondary | ICD-10-CM | POA: Diagnosis not present

## 2022-08-29 DIAGNOSIS — F32A Depression, unspecified: Secondary | ICD-10-CM | POA: Diagnosis not present

## 2022-08-29 DIAGNOSIS — I2510 Atherosclerotic heart disease of native coronary artery without angina pectoris: Secondary | ICD-10-CM | POA: Diagnosis not present

## 2022-09-17 DIAGNOSIS — E1151 Type 2 diabetes mellitus with diabetic peripheral angiopathy without gangrene: Secondary | ICD-10-CM | POA: Diagnosis not present

## 2022-09-17 DIAGNOSIS — D649 Anemia, unspecified: Secondary | ICD-10-CM | POA: Diagnosis not present

## 2022-09-17 DIAGNOSIS — E559 Vitamin D deficiency, unspecified: Secondary | ICD-10-CM | POA: Diagnosis not present

## 2022-09-17 DIAGNOSIS — N183 Chronic kidney disease, stage 3 unspecified: Secondary | ICD-10-CM | POA: Diagnosis not present

## 2022-09-17 DIAGNOSIS — I10 Essential (primary) hypertension: Secondary | ICD-10-CM | POA: Diagnosis not present

## 2022-09-17 DIAGNOSIS — E1142 Type 2 diabetes mellitus with diabetic polyneuropathy: Secondary | ICD-10-CM | POA: Diagnosis not present

## 2022-09-17 DIAGNOSIS — I2510 Atherosclerotic heart disease of native coronary artery without angina pectoris: Secondary | ICD-10-CM | POA: Diagnosis not present

## 2022-09-17 DIAGNOSIS — E785 Hyperlipidemia, unspecified: Secondary | ICD-10-CM | POA: Diagnosis not present

## 2022-09-17 DIAGNOSIS — I480 Paroxysmal atrial fibrillation: Secondary | ICD-10-CM | POA: Diagnosis not present

## 2022-09-20 DIAGNOSIS — E785 Hyperlipidemia, unspecified: Secondary | ICD-10-CM | POA: Diagnosis not present

## 2022-09-20 DIAGNOSIS — R059 Cough, unspecified: Secondary | ICD-10-CM | POA: Diagnosis not present

## 2022-09-20 DIAGNOSIS — I2510 Atherosclerotic heart disease of native coronary artery without angina pectoris: Secondary | ICD-10-CM | POA: Diagnosis not present

## 2022-09-20 DIAGNOSIS — E1151 Type 2 diabetes mellitus with diabetic peripheral angiopathy without gangrene: Secondary | ICD-10-CM | POA: Diagnosis not present

## 2022-09-20 DIAGNOSIS — I480 Paroxysmal atrial fibrillation: Secondary | ICD-10-CM | POA: Diagnosis not present

## 2022-09-20 DIAGNOSIS — F32A Depression, unspecified: Secondary | ICD-10-CM | POA: Diagnosis not present

## 2022-09-20 DIAGNOSIS — K219 Gastro-esophageal reflux disease without esophagitis: Secondary | ICD-10-CM | POA: Diagnosis not present

## 2022-10-02 DIAGNOSIS — M6281 Muscle weakness (generalized): Secondary | ICD-10-CM | POA: Diagnosis not present

## 2022-10-03 DIAGNOSIS — L84 Corns and callosities: Secondary | ICD-10-CM | POA: Diagnosis not present

## 2022-10-03 DIAGNOSIS — E1151 Type 2 diabetes mellitus with diabetic peripheral angiopathy without gangrene: Secondary | ICD-10-CM | POA: Diagnosis not present

## 2022-10-03 DIAGNOSIS — L602 Onychogryphosis: Secondary | ICD-10-CM | POA: Diagnosis not present

## 2022-10-03 DIAGNOSIS — K219 Gastro-esophageal reflux disease without esophagitis: Secondary | ICD-10-CM | POA: Diagnosis not present

## 2022-10-03 DIAGNOSIS — L603 Nail dystrophy: Secondary | ICD-10-CM | POA: Diagnosis not present

## 2022-10-03 DIAGNOSIS — I480 Paroxysmal atrial fibrillation: Secondary | ICD-10-CM | POA: Diagnosis not present

## 2022-10-03 DIAGNOSIS — E785 Hyperlipidemia, unspecified: Secondary | ICD-10-CM | POA: Diagnosis not present

## 2022-10-03 DIAGNOSIS — Z7984 Long term (current) use of oral hypoglycemic drugs: Secondary | ICD-10-CM | POA: Diagnosis not present

## 2022-10-03 DIAGNOSIS — M6281 Muscle weakness (generalized): Secondary | ICD-10-CM | POA: Diagnosis not present

## 2022-10-03 DIAGNOSIS — I2510 Atherosclerotic heart disease of native coronary artery without angina pectoris: Secondary | ICD-10-CM | POA: Diagnosis not present

## 2022-10-04 DIAGNOSIS — M6281 Muscle weakness (generalized): Secondary | ICD-10-CM | POA: Diagnosis not present

## 2022-10-07 DIAGNOSIS — M6281 Muscle weakness (generalized): Secondary | ICD-10-CM | POA: Diagnosis not present

## 2022-10-08 DIAGNOSIS — M6281 Muscle weakness (generalized): Secondary | ICD-10-CM | POA: Diagnosis not present

## 2022-10-09 DIAGNOSIS — M6281 Muscle weakness (generalized): Secondary | ICD-10-CM | POA: Diagnosis not present

## 2022-10-10 DIAGNOSIS — M6281 Muscle weakness (generalized): Secondary | ICD-10-CM | POA: Diagnosis not present

## 2022-10-11 DIAGNOSIS — M6281 Muscle weakness (generalized): Secondary | ICD-10-CM | POA: Diagnosis not present

## 2022-10-14 DIAGNOSIS — M6281 Muscle weakness (generalized): Secondary | ICD-10-CM | POA: Diagnosis not present

## 2022-10-15 DIAGNOSIS — M6281 Muscle weakness (generalized): Secondary | ICD-10-CM | POA: Diagnosis not present

## 2022-10-16 DIAGNOSIS — E1151 Type 2 diabetes mellitus with diabetic peripheral angiopathy without gangrene: Secondary | ICD-10-CM | POA: Diagnosis not present

## 2022-10-16 DIAGNOSIS — D649 Anemia, unspecified: Secondary | ICD-10-CM | POA: Diagnosis not present

## 2022-10-16 DIAGNOSIS — M6281 Muscle weakness (generalized): Secondary | ICD-10-CM | POA: Diagnosis not present

## 2022-10-16 DIAGNOSIS — I2510 Atherosclerotic heart disease of native coronary artery without angina pectoris: Secondary | ICD-10-CM | POA: Diagnosis not present

## 2022-10-16 DIAGNOSIS — E785 Hyperlipidemia, unspecified: Secondary | ICD-10-CM | POA: Diagnosis not present

## 2022-10-16 DIAGNOSIS — E1142 Type 2 diabetes mellitus with diabetic polyneuropathy: Secondary | ICD-10-CM | POA: Diagnosis not present

## 2022-10-16 DIAGNOSIS — I480 Paroxysmal atrial fibrillation: Secondary | ICD-10-CM | POA: Diagnosis not present

## 2022-10-16 DIAGNOSIS — E559 Vitamin D deficiency, unspecified: Secondary | ICD-10-CM | POA: Diagnosis not present

## 2022-10-16 DIAGNOSIS — I70209 Unspecified atherosclerosis of native arteries of extremities, unspecified extremity: Secondary | ICD-10-CM | POA: Diagnosis not present

## 2022-10-16 DIAGNOSIS — I10 Essential (primary) hypertension: Secondary | ICD-10-CM | POA: Diagnosis not present

## 2022-10-16 DIAGNOSIS — N183 Chronic kidney disease, stage 3 unspecified: Secondary | ICD-10-CM | POA: Diagnosis not present

## 2022-10-17 DIAGNOSIS — M6281 Muscle weakness (generalized): Secondary | ICD-10-CM | POA: Diagnosis not present

## 2022-10-18 DIAGNOSIS — I2510 Atherosclerotic heart disease of native coronary artery without angina pectoris: Secondary | ICD-10-CM | POA: Diagnosis not present

## 2022-10-18 DIAGNOSIS — E559 Vitamin D deficiency, unspecified: Secondary | ICD-10-CM | POA: Diagnosis not present

## 2022-10-18 DIAGNOSIS — I10 Essential (primary) hypertension: Secondary | ICD-10-CM | POA: Diagnosis not present

## 2022-10-18 DIAGNOSIS — E1151 Type 2 diabetes mellitus with diabetic peripheral angiopathy without gangrene: Secondary | ICD-10-CM | POA: Diagnosis not present

## 2022-10-18 DIAGNOSIS — K921 Melena: Secondary | ICD-10-CM | POA: Diagnosis not present

## 2022-10-18 DIAGNOSIS — I480 Paroxysmal atrial fibrillation: Secondary | ICD-10-CM | POA: Diagnosis not present

## 2022-10-18 DIAGNOSIS — K5900 Constipation, unspecified: Secondary | ICD-10-CM | POA: Diagnosis not present

## 2022-10-18 DIAGNOSIS — K219 Gastro-esophageal reflux disease without esophagitis: Secondary | ICD-10-CM | POA: Diagnosis not present

## 2022-10-18 DIAGNOSIS — E785 Hyperlipidemia, unspecified: Secondary | ICD-10-CM | POA: Diagnosis not present

## 2022-10-19 DIAGNOSIS — M6281 Muscle weakness (generalized): Secondary | ICD-10-CM | POA: Diagnosis not present

## 2022-10-19 DIAGNOSIS — R197 Diarrhea, unspecified: Secondary | ICD-10-CM | POA: Diagnosis not present

## 2022-10-19 DIAGNOSIS — I10 Essential (primary) hypertension: Secondary | ICD-10-CM | POA: Diagnosis not present

## 2022-10-21 DIAGNOSIS — M6281 Muscle weakness (generalized): Secondary | ICD-10-CM | POA: Diagnosis not present

## 2022-10-22 DIAGNOSIS — K921 Melena: Secondary | ICD-10-CM | POA: Diagnosis not present

## 2022-10-22 DIAGNOSIS — M6281 Muscle weakness (generalized): Secondary | ICD-10-CM | POA: Diagnosis not present

## 2022-10-23 DIAGNOSIS — R059 Cough, unspecified: Secondary | ICD-10-CM | POA: Diagnosis not present

## 2022-10-23 DIAGNOSIS — K921 Melena: Secondary | ICD-10-CM | POA: Diagnosis not present

## 2022-10-23 DIAGNOSIS — M6281 Muscle weakness (generalized): Secondary | ICD-10-CM | POA: Diagnosis not present

## 2022-10-23 DIAGNOSIS — J209 Acute bronchitis, unspecified: Secondary | ICD-10-CM | POA: Diagnosis not present

## 2022-10-24 DIAGNOSIS — D649 Anemia, unspecified: Secondary | ICD-10-CM | POA: Diagnosis not present

## 2022-10-24 DIAGNOSIS — R339 Retention of urine, unspecified: Secondary | ICD-10-CM | POA: Diagnosis not present

## 2022-10-24 DIAGNOSIS — N390 Urinary tract infection, site not specified: Secondary | ICD-10-CM | POA: Diagnosis not present

## 2022-10-24 DIAGNOSIS — M6281 Muscle weakness (generalized): Secondary | ICD-10-CM | POA: Diagnosis not present

## 2022-10-26 DIAGNOSIS — M6281 Muscle weakness (generalized): Secondary | ICD-10-CM | POA: Diagnosis not present

## 2022-10-28 DIAGNOSIS — M6281 Muscle weakness (generalized): Secondary | ICD-10-CM | POA: Diagnosis not present

## 2022-10-28 DIAGNOSIS — J069 Acute upper respiratory infection, unspecified: Secondary | ICD-10-CM | POA: Diagnosis not present

## 2022-10-29 DIAGNOSIS — M6281 Muscle weakness (generalized): Secondary | ICD-10-CM | POA: Diagnosis not present

## 2022-10-31 DIAGNOSIS — I10 Essential (primary) hypertension: Secondary | ICD-10-CM | POA: Diagnosis not present

## 2022-10-31 DIAGNOSIS — K219 Gastro-esophageal reflux disease without esophagitis: Secondary | ICD-10-CM | POA: Diagnosis not present

## 2022-10-31 DIAGNOSIS — M6281 Muscle weakness (generalized): Secondary | ICD-10-CM | POA: Diagnosis not present

## 2022-10-31 DIAGNOSIS — K921 Melena: Secondary | ICD-10-CM | POA: Diagnosis not present

## 2022-10-31 DIAGNOSIS — E1151 Type 2 diabetes mellitus with diabetic peripheral angiopathy without gangrene: Secondary | ICD-10-CM | POA: Diagnosis not present

## 2022-11-02 DIAGNOSIS — M6281 Muscle weakness (generalized): Secondary | ICD-10-CM | POA: Diagnosis not present

## 2022-11-03 DIAGNOSIS — M6281 Muscle weakness (generalized): Secondary | ICD-10-CM | POA: Diagnosis not present

## 2022-11-04 DIAGNOSIS — R112 Nausea with vomiting, unspecified: Secondary | ICD-10-CM | POA: Diagnosis not present

## 2022-11-04 DIAGNOSIS — K5900 Constipation, unspecified: Secondary | ICD-10-CM | POA: Diagnosis not present

## 2022-11-05 ENCOUNTER — Other Ambulatory Visit (INDEPENDENT_AMBULATORY_CARE_PROVIDER_SITE_OTHER): Payer: 59

## 2022-11-05 ENCOUNTER — Ambulatory Visit (INDEPENDENT_AMBULATORY_CARE_PROVIDER_SITE_OTHER): Payer: 59 | Admitting: Gastroenterology

## 2022-11-05 ENCOUNTER — Encounter: Payer: Self-pay | Admitting: Gastroenterology

## 2022-11-05 VITALS — BP 106/82 | HR 62 | Ht 76.0 in | Wt 180.0 lb

## 2022-11-05 DIAGNOSIS — M6281 Muscle weakness (generalized): Secondary | ICD-10-CM | POA: Diagnosis not present

## 2022-11-05 DIAGNOSIS — R110 Nausea: Secondary | ICD-10-CM | POA: Insufficient documentation

## 2022-11-05 DIAGNOSIS — K921 Melena: Secondary | ICD-10-CM | POA: Diagnosis not present

## 2022-11-05 LAB — CBC WITH DIFFERENTIAL/PLATELET
Basophils Absolute: 0 10*3/uL (ref 0.0–0.1)
Basophils Relative: 0.5 % (ref 0.0–3.0)
Eosinophils Absolute: 0.1 10*3/uL (ref 0.0–0.7)
Eosinophils Relative: 1.1 % (ref 0.0–5.0)
HCT: 39.1 % (ref 39.0–52.0)
Hemoglobin: 12.6 g/dL — ABNORMAL LOW (ref 13.0–17.0)
Lymphocytes Relative: 23.8 % (ref 12.0–46.0)
Lymphs Abs: 1.7 10*3/uL (ref 0.7–4.0)
MCHC: 32.1 g/dL (ref 30.0–36.0)
MCV: 91.7 fL (ref 78.0–100.0)
Monocytes Absolute: 0.5 10*3/uL (ref 0.1–1.0)
Monocytes Relative: 7.4 % (ref 3.0–12.0)
Neutro Abs: 4.8 10*3/uL (ref 1.4–7.7)
Neutrophils Relative %: 67.2 % (ref 43.0–77.0)
Platelets: 182 10*3/uL (ref 150.0–400.0)
RBC: 4.26 Mil/uL (ref 4.22–5.81)
RDW: 20.1 % — ABNORMAL HIGH (ref 11.5–15.5)
WBC: 7.2 10*3/uL (ref 4.0–10.5)

## 2022-11-05 LAB — BASIC METABOLIC PANEL
BUN: 14 mg/dL (ref 6–23)
CO2: 28 meq/L (ref 19–32)
Calcium: 9.4 mg/dL (ref 8.4–10.5)
Chloride: 99 meq/L (ref 96–112)
Creatinine, Ser: 1.65 mg/dL — ABNORMAL HIGH (ref 0.40–1.50)
GFR: 40.31 mL/min — ABNORMAL LOW (ref >60.00)
Glucose, Bld: 127 mg/dL — ABNORMAL HIGH (ref 70–99)
Potassium: 3.4 meq/L — ABNORMAL LOW (ref 3.5–5.1)
Sodium: 138 meq/L (ref 135–145)

## 2022-11-05 NOTE — Patient Instructions (Addendum)
Your provider has requested that you go to the basement level for lab work before leaving today. Press "B" on the elevator. The lab is located at the first door on the left as you exit the elevator.  You have been scheduled for a CT scan of the abdomen and pelvis at [facility], 1st floor Radiology. You are scheduled on Friday 11/15/22 at 1:30 pm. You should arrive 11 am to your appointment time for registration and  drink oral contrast.    Please follow the written instructions below on the day of your exam:   1) Do not eat anything after 10 am (4 hours prior to your test)    You may take any medications as prescribed with a small amount of water, if necessary. If you take any of the following medications: METFORMIN, GLUCOPHAGE, GLUCOVANCE, AVANDAMET, RIOMET, FORTAMET, ACTOPLUS MET, JANUMET, GLUMETZA or METAGLIP, you MAY be asked to HOLD this medication 48 hours AFTER the exam.   The purpose of you drinking the oral contrast is to aid in the visualization of your intestinal tract. The contrast solution may cause some diarrhea. Depending on your individual set of symptoms, you may also receive an intravenous injection of x-ray contrast/dye. Plan on being at [facility] for 45 minutes or longer, depending on the type of exam you are having performed.   If you have any questions regarding your exam or if you need to reschedule, you may call [facility] at [phone] between the hours of 8:00 am and 5:00 pm, Monday-Friday.     _______________________________________________________  If your blood pressure at your visit was 140/90 or greater, please contact your primary care physician to follow up on this.  _______________________________________________________  If you are age 74 or older, your body mass index should be between 23-30. Your Body mass index is 21.91 kg/m. If this is out of the aforementioned range listed, please consider follow up with your Primary Care  Provider.  If you are age 34 or younger, your body mass index should be between 19-25. Your Body mass index is 21.91 kg/m. If this is out of the aformentioned range listed, please consider follow up with your Primary Care Provider.   ________________________________________________________  The [facility] providers would like to encourage you to use [facility] to communicate with providers for non-urgent requests or questions.  Due to long hold times on the telephone, sending your provider a message by [facility] may be a faster and more efficient way to get a response.  Please allow 48 business hours for a response.  Please remember that this is for non-urgent requests.  _______________________________________________________

## 2022-11-05 NOTE — Progress Notes (Signed)
11/05/2022 Brian Burns [id number] 31-Aug-1945   HISTORY OF PRESENT ILLNESS:  This is a 77 year old male with multiple medical problems as listed below.  He was referred here for heme positive stool.  Apparently had 1 episode of rectal bleeding then was hemocculted and was heme positive at the nursing facility.  He is minimally verbal, nonambulatory, incontinent, lives at a nursing facility.  This is all in the setting of Eliquis.  We do not have access to any recent labs.  Has not had a colonoscopy in several years.  His daughter was present with him today.  He does have constipation and is on some treatment regimens for that.  He denies any abdominal pain.  She reports that he complains of nausea when rolling himself around in his wheelchair at times.  Seems to bring up a lot of phlegm and mucus on a daily basis.  Has a history of what looks like to be probably a severe esophageal dysmotility disorder from previous esophagrams and endoscopies.  He denies any dysphagia and she says that he does not seem to have any trouble with swallowing.  He says that his appetite is good and he actually eats fairly well.   Past Medical History:  Diagnosis Date   AAA (abdominal aortic aneurysm) without rupture (HCC) 06/2014   no change in last exam from 2015   Achalasia    Anxiety    Arthritis    knees,lower back   Atrial fibrillation (HCC)    Atrial flutter with rapid ventricular response (HCC) 01/16/2015   Bilateral wrist pain 09/09/2014   Bladder outlet obstruction 01/20/2015   Bradycardia 08/27/2016   CAD (coronary artery disease)    pt denies   Carotid artery disease (HCC)    Chronic back pain    Chronic kidney disease (CKD), stage III (moderate) (HCC)    Constipation    Decreased visual acuity of left eye  12/02/2018   Depression    Dysphagia, oropharyngeal phase    Dyspnea    reports today no pregoression   Erectile dysfunction    GERD (gastroesophageal reflux disease)    Headache     hx of   History of CVA (cerebrovascular accident)    2009  &  2013   History of Multiple lacunar infarcts (HCC) 01/31/2015   MRI Brain 01/31/2015: Widespread old lacunar infarcts, particularly right Basal ganglia, pons, and right cerebellum   HLD (hyperlipidemia)    Hypertension    Idiopathic pancreatitis 12/2014   acute   Knee pain, bilateral 04/09/2011   Malnutrition of moderate degree 01/25/2015   Memory impairment    mild dementia per PCP progress note    Morbid obesity (HCC) 12/03/2018   Other abnormalities of gait and mobility 12/03/2018   PVD (peripheral vascular disease) with claudication (HCC)    Type 2 diabetes mellitus (HCC)    Urethral stricture 02/16/2013   Urge incontinence 12/08/2018   Vascular dementia [facility])    Past Surgical History:  Procedure Laterality Date   back injections     [facility]   BALLOON DILATION N/A 12/16/2018   Procedure: URETHRAL BALLOON DILATION;  Surgeon: Crist Fat, MD;  Location: [facility];  Service: Urology;  Laterality: N/A;   BIOPSY  05/22/2020   Procedure: BIOPSY;  Surgeon: [name], MD;  Location: [facility] ENDOSCOPY;  Service: Gastroenterology;;   CARDIOVASCULAR STRESS TEST  08-09-2009   mild global hypokinesis,  no ischemia or scar/  ef 41%   CATARACT EXTRACTION  W/ INTRAOCULAR LENS  IMPLANT, BILATERAL  april and may 2014   CYSTOSCOPY N/A 01/17/2017   Procedure: CYSTOSCOPY FLEXIBLE;  Surgeon: Crist Fat, MD;  Location: [facility];  Service: Urology;  Laterality: N/A;   CYSTOSCOPY WITH RETROGRADE URETHROGRAM N/A 11/24/2014   Procedure: CYSTOSCOPY WITH RETROGRADE URETHROGRAM;  Surgeon: Crist Fat, MD;  Location: [facility];  Service: Urology;  Laterality: N/A;   CYSTOSCOPY WITH RETROGRADE URETHROGRAM N/A 12/16/2018   Procedure: CYSTOSCOPY WITH RETROGRADE URETHROGRAM;  Surgeon: Crist Fat, MD;  Location: [facility];  Service: Urology;  Laterality: N/A;   CYSTOSCOPY WITH RETROGRADE  URETHROGRAM N/A 12/16/2019   Procedure: CYSTOSCOPY WITH RETROGRADE, Direct Vision Internal Urethrotomy;  Surgeon: Crist Fat, MD;  Location: [facility];  Service: Urology;  Laterality: N/A;   CYSTOSCOPY WITH URETHRAL DILATATION N/A 11/24/2014   Procedure: CYSTOSCOPY WITH URETHRAL DILATATION;  Surgeon: Crist Fat, MD;  [facility];  Service: Urology;  Laterality: N/A;  BALLOON DILATION         ESOPHAGEAL DILATION  05/22/2020   Procedure: ESOPHAGEAL DILATION;  Surgeon: [name], MD;  Location: [facility];  Service: Gastroenterology;;   ESOPHAGOGASTRODUODENOSCOPY N/A 05/15/2014   Procedure: ESOPHAGOGASTRODUODENOSCOPY (EGD);  Surgeon: Carman Ching, MD;  Location[facility] ENDOSCOPY;  Service: Endoscopy;  Laterality: N/A;   ESOPHAGOGASTRODUODENOSCOPY Left 02/02/2015   Procedure: ESOPHAGOGASTRODUODENOSCOPY (EGD);  Surgeon: Dorena Cookey, MD;  Location: [facility] ENDOSCOPY;  Service: Endoscopy;  Laterality: Left;   ESOPHAGOGASTRODUODENOSCOPY Left 03/16/2015   Procedure: ESOPHAGOGASTRODUODENOSCOPY (EGD);  Surgeon: Dorena Cookey, MD;  Location: [facility];  Service: Endoscopy;  Laterality: Left;   ESOPHAGOGASTRODUODENOSCOPY (EGD) WITH PROPOFOL N/A 05/22/2020   Procedure: ESOPHAGOGASTRODUODENOSCOPY (EGD) WITH PROPOFOL;  Surgeon: [name], MD;  Location: [facility];  Service: Gastroenterology;  Laterality: N/A;   FOREIGN BODY REMOVAL  05/22/2020   Procedure: FOREIGN BODY REMOVAL;  Surgeon: [name], MD;  Location: [facility];  Service: Gastroenterology;;   TRANSTHORACIC ECHOCARDIOGRAM  01-07-2012   mild to moderate dilated LV,  ef 45-50%,  mild basal hypokinesis,  grade I diastolic  dysfunction/  trivial AR/  mild MR   URETHROPLASTY N/A 01/17/2017   Procedure: URETHEROPLASTY BUCCAL GRAFT AND BUCCAL MUCOSA GRAFT HARVEST;  Surgeon: Crist Fat, MD;  Location: [facility];  Service: Urology;  Laterality: N/A;    reports that he quit smoking about 23  years ago. His smoking use included cigarettes. He started smoking about 63 years ago. He has a 20 pack-year smoking history. He has never used smokeless tobacco. He reports that he does not drink alcohol and does not use drugs. family history includes Heart disease in his brother and mother; Kidney disease in his brother and mother; Lung disease in his brother and mother. Allergies  Allergen Reactions   Gabapentin Other (See Comments)    Sedation at 300mg  TID      Outpatient Encounter Medications as of 11/05/2022  Medication Sig   apixaban (ELIQUIS) 5 MG TABS tablet Take 5 mg by mouth 2 (two) times daily.   Benzonatate (TESSALON PERLES PO) Take 1 tablet by mouth daily.   buPROPion (WELLBUTRIN) 75 MG tablet Take 75 mg by mouth every morning.   Cholecalciferol (VITAMIN D) 50 MCG (2000 UT) tablet Take 2,000 Units by mouth daily.   famotidine (PEPCID) 20 MG tablet Take 20 mg by mouth 2 (two) times daily.   Ferrous Fumarate (HEMOCYTE - 106 MG FE) 324 (106 Fe) MG TABS tablet Take 106 mg of iron by mouth daily.  finasteride (PROSCAR) 5 MG tablet Take 5 mg by mouth at bedtime.   lipase/protease/amylase (CREON) 12000-38000 units CPEP capsule Take 12,000 Units by mouth 3 (three) times daily before meals.   melatonin 3 MG TABS tablet Take 3 mg by mouth at bedtime.   Multiple Vitamin (MULTIVITAMIN) capsule Take 1 capsule by mouth daily.   ondansetron (ZOFRAN) 4 MG tablet Take 4 mg by mouth every 6 (six) hours as needed for nausea or vomiting.   pantoprazole (PROTONIX) 40 MG tablet Take 40 mg by mouth 2 (two) times daily.   polyethylene glycol (MIRALAX / GLYCOLAX) 17 g packet Take 17 g by mouth daily as needed. (Patient taking differently: Take 17 g by mouth daily as needed for mild constipation.)   potassium chloride (KLOR-CON) 10 MEQ tablet Take 1 tablet (10 mEq total) by mouth daily.   pregabalin (LYRICA) 75 MG capsule Take 75 mg by mouth 2 (two) times daily.   senna (SENOKOT) 8.6 MG tablet Take 2  tablets by mouth 2 (two) times daily.   sodium phosphate (FLEET) ENEM Place 1 enema rectally daily as needed for severe constipation.   traMADol (ULTRAM) 50 MG tablet Take 1 tablet (50 mg total) by mouth 3 (three) times daily as needed for moderate pain.   [DISCONTINUED] acetaminophen (TYLENOL) 325 MG tablet Take 650 mg by mouth every 4 (four) hours as needed for moderate pain. (Patient not taking: Reported on 11/05/2022)   [DISCONTINUED] alum & mag hydroxide-simeth (MAALOX/MYLANTA) 200-200-20 MG/5ML suspension Take 30 mLs by mouth every 6 (six) hours as needed for indigestion or heartburn. (Patient not taking: Reported on 11/05/2022)   [DISCONTINUED] amoxicillin-clavulanate (AUGMENTIN) 500-125 MG tablet Take 1 tablet by mouth See admin instructions. Bid x 7 days (Patient not taking: Reported on 11/05/2022)   [DISCONTINUED] atorvastatin (LIPITOR) 80 MG tablet Take 1 tablet (80 mg total) by mouth daily. (Patient not taking: Reported on 11/05/2022)   [DISCONTINUED] docusate sodium (COLACE) 100 MG capsule Take 100 mg by mouth 2 (two) times daily.   [DISCONTINUED] DULoxetine (CYMBALTA) 20 MG capsule Take 20 mg by mouth daily. (Patient not taking: Reported on 11/05/2022)   [DISCONTINUED] pantoprazole (PROTONIX) 40 MG tablet Take 1 tablet (40 mg total) by mouth daily. (Patient not taking: Reported on 06/27/2021)   [DISCONTINUED] traZODone (DESYREL) 50 MG tablet Take 50 mg by mouth at bedtime. (Patient not taking: Reported on 11/05/2022)   No facility-administered encounter medications on file as of 11/05/2022.    REVIEW OF SYSTEMS  : All other systems reviewed and negative except where noted in the History of Present Illness.   PHYSICAL EXAM: BP 106/82   Pulse 62   Ht 6\' 4"  (1.93 m)   Wt 180 lb (81.6 kg) Comment: per caregiver, pt in wheelchair, unable to stand  SpO2 95%   BMI 21.91 kg/m  General: Chronically ill-appearing male in no acute distress; in wheelchair Head: Normocephalic and  atraumatic Eyes:  Sclerae anicteric, conjunctiva pink. Ears: Normal auditory acuity Lungs: Clear throughout to auscultation; no W/R/R. Heart: Regular rate and rhythm; no M/R/G. Abdomen: Soft, non-distended.  BS present.  Mild lower abdominal TTP.  ASSESSMENT AND PLAN: 77 year old male referred here for heme positive stool.  Apparently had 1 episode of rectal bleeding then was hemocculted and was heme positive at the nursing facility.  He has multiple medical problems including history of stroke.  He is minimally verbal, nonambulatory, incontinent, lives at a nursing facility.  This is all in the setting of Eliquis.  We do not  have access to any recent labs.  Has not had a colonoscopy in several years.  His daughter was present with him today.  We discussed how colonoscopy would likely be very high risk and potentially not well-tolerated for him.  We decided to proceed with CT scan of the abdomen and pelvis with contrast to rule out any large bulky pathology, most importantly malignancy.  From there this may be just an observation type of thing.  He does have constipation and is on some treatment regimens for that so should continue management of that to avoid hemorrhoidal bleeding. *Nausea: She reports that he complains of nausea when rolling himself around in his wheelchair at times.  Seems to bring up a lot of phlegm and mucus on a daily basis.  Has a history of what looks like to be probably a severe esophageal dysmotility disorder from previous esophagrams and endoscopies.  He does not complain of any dysphagia currently.  Once again we will check CT of the abdomen and pelvis.  He is on PPI therapy and should continue that.   CC:  Mindi Slicker, NP

## 2022-11-06 NOTE — Progress Notes (Signed)
Addendum: Reviewed and agree with assessment and management plan. Kadijah Shamoon M, MD  

## 2022-11-07 DIAGNOSIS — M6281 Muscle weakness (generalized): Secondary | ICD-10-CM | POA: Diagnosis not present

## 2022-11-08 DIAGNOSIS — M6281 Muscle weakness (generalized): Secondary | ICD-10-CM | POA: Diagnosis not present

## 2022-11-09 DIAGNOSIS — M6281 Muscle weakness (generalized): Secondary | ICD-10-CM | POA: Diagnosis not present

## 2022-11-11 DIAGNOSIS — M6281 Muscle weakness (generalized): Secondary | ICD-10-CM | POA: Diagnosis not present

## 2022-11-11 DIAGNOSIS — D638 Anemia in other chronic diseases classified elsewhere: Secondary | ICD-10-CM | POA: Diagnosis not present

## 2022-11-11 DIAGNOSIS — K922 Gastrointestinal hemorrhage, unspecified: Secondary | ICD-10-CM | POA: Diagnosis not present

## 2022-11-11 DIAGNOSIS — I129 Hypertensive chronic kidney disease with stage 1 through stage 4 chronic kidney disease, or unspecified chronic kidney disease: Secondary | ICD-10-CM | POA: Diagnosis not present

## 2022-11-11 DIAGNOSIS — K219 Gastro-esophageal reflux disease without esophagitis: Secondary | ICD-10-CM | POA: Diagnosis not present

## 2022-11-12 DIAGNOSIS — M6281 Muscle weakness (generalized): Secondary | ICD-10-CM | POA: Diagnosis not present

## 2022-11-13 ENCOUNTER — Other Ambulatory Visit (HOSPITAL_COMMUNITY): Payer: 59

## 2022-11-13 DIAGNOSIS — M6281 Muscle weakness (generalized): Secondary | ICD-10-CM | POA: Diagnosis not present

## 2022-11-15 ENCOUNTER — Ambulatory Visit (HOSPITAL_COMMUNITY)
Admission: RE | Admit: 2022-11-15 | Discharge: 2022-11-15 | Disposition: A | Discharge status: Home / Self Care | Payer: 59 | Source: Ambulatory Visit | Attending: Gastroenterology | Admitting: Gastroenterology

## 2022-11-15 DIAGNOSIS — M6281 Muscle weakness (generalized): Secondary | ICD-10-CM | POA: Diagnosis not present

## 2022-11-15 DIAGNOSIS — D649 Anemia, unspecified: Secondary | ICD-10-CM | POA: Diagnosis not present

## 2022-11-15 DIAGNOSIS — N183 Chronic kidney disease, stage 3 unspecified: Secondary | ICD-10-CM | POA: Diagnosis not present

## 2022-11-15 DIAGNOSIS — I7143 Infrarenal abdominal aortic aneurysm, without rupture: Secondary | ICD-10-CM | POA: Diagnosis not present

## 2022-11-15 DIAGNOSIS — I2510 Atherosclerotic heart disease of native coronary artery without angina pectoris: Secondary | ICD-10-CM | POA: Diagnosis not present

## 2022-11-15 DIAGNOSIS — E785 Hyperlipidemia, unspecified: Secondary | ICD-10-CM | POA: Diagnosis not present

## 2022-11-15 DIAGNOSIS — E559 Vitamin D deficiency, unspecified: Secondary | ICD-10-CM | POA: Diagnosis not present

## 2022-11-15 DIAGNOSIS — I10 Essential (primary) hypertension: Secondary | ICD-10-CM | POA: Diagnosis not present

## 2022-11-15 DIAGNOSIS — K921 Melena: Secondary | ICD-10-CM | POA: Diagnosis not present

## 2022-11-15 DIAGNOSIS — I723 Aneurysm of iliac artery: Secondary | ICD-10-CM | POA: Diagnosis not present

## 2022-11-15 DIAGNOSIS — E1151 Type 2 diabetes mellitus with diabetic peripheral angiopathy without gangrene: Secondary | ICD-10-CM | POA: Diagnosis not present

## 2022-11-15 DIAGNOSIS — I151 Hypertension secondary to other renal disorders: Secondary | ICD-10-CM | POA: Diagnosis not present

## 2022-11-15 DIAGNOSIS — E1140 Type 2 diabetes mellitus with diabetic neuropathy, unspecified: Secondary | ICD-10-CM | POA: Diagnosis not present

## 2022-11-15 DIAGNOSIS — I70209 Unspecified atherosclerosis of native arteries of extremities, unspecified extremity: Secondary | ICD-10-CM | POA: Diagnosis not present

## 2022-11-15 DIAGNOSIS — I480 Paroxysmal atrial fibrillation: Secondary | ICD-10-CM | POA: Diagnosis not present

## 2022-11-15 DIAGNOSIS — R110 Nausea: Secondary | ICD-10-CM | POA: Insufficient documentation

## 2022-11-15 MED ORDER — SODIUM CHLORIDE (PF) 0.9 % IJ SOLN
INTRAMUSCULAR | Status: AC
  Filled 2022-11-15: qty 50

## 2022-11-15 MED ORDER — IOHEXOL 9 MG/ML PO SOLN
ORAL | Status: AC
  Filled 2022-11-15: qty 1000

## 2022-11-15 MED ORDER — IOHEXOL 9 MG/ML PO SOLN
1000.0000 mL | ORAL | Status: AC
  Administered 2022-11-15: 1000 mL via ORAL

## 2022-11-15 MED ORDER — IOHEXOL 300 MG/ML  SOLN
100.0000 mL | Freq: Once | INTRAMUSCULAR | Status: AC | PRN
  Administered 2022-11-15: 100 mL via INTRAVENOUS

## 2022-11-16 DIAGNOSIS — M6281 Muscle weakness (generalized): Secondary | ICD-10-CM | POA: Diagnosis not present

## 2022-11-17 DIAGNOSIS — M6281 Muscle weakness (generalized): Secondary | ICD-10-CM | POA: Diagnosis not present

## 2022-11-18 ENCOUNTER — Other Ambulatory Visit (HOSPITAL_COMMUNITY): Payer: 59

## 2022-11-18 DIAGNOSIS — M6281 Muscle weakness (generalized): Secondary | ICD-10-CM | POA: Diagnosis not present

## 2022-11-22 DIAGNOSIS — M6281 Muscle weakness (generalized): Secondary | ICD-10-CM | POA: Diagnosis not present

## 2022-11-23 DIAGNOSIS — M6281 Muscle weakness (generalized): Secondary | ICD-10-CM | POA: Diagnosis not present

## 2022-11-24 DIAGNOSIS — M6281 Muscle weakness (generalized): Secondary | ICD-10-CM | POA: Diagnosis not present

## 2022-11-25 DIAGNOSIS — M6281 Muscle weakness (generalized): Secondary | ICD-10-CM | POA: Diagnosis not present

## 2022-11-26 DIAGNOSIS — M6281 Muscle weakness (generalized): Secondary | ICD-10-CM | POA: Diagnosis not present

## 2022-11-27 DIAGNOSIS — M6281 Muscle weakness (generalized): Secondary | ICD-10-CM | POA: Diagnosis not present

## 2022-11-28 DIAGNOSIS — M6281 Muscle weakness (generalized): Secondary | ICD-10-CM | POA: Diagnosis not present

## 2022-11-30 DIAGNOSIS — M6281 Muscle weakness (generalized): Secondary | ICD-10-CM | POA: Diagnosis not present

## 2022-12-02 DIAGNOSIS — G894 Chronic pain syndrome: Secondary | ICD-10-CM | POA: Diagnosis not present

## 2022-12-02 DIAGNOSIS — I129 Hypertensive chronic kidney disease with stage 1 through stage 4 chronic kidney disease, or unspecified chronic kidney disease: Secondary | ICD-10-CM | POA: Diagnosis not present

## 2022-12-02 DIAGNOSIS — M6281 Muscle weakness (generalized): Secondary | ICD-10-CM | POA: Diagnosis not present

## 2022-12-03 DIAGNOSIS — M6281 Muscle weakness (generalized): Secondary | ICD-10-CM | POA: Diagnosis not present

## 2022-12-05 DIAGNOSIS — I10 Essential (primary) hypertension: Secondary | ICD-10-CM | POA: Diagnosis not present

## 2022-12-05 DIAGNOSIS — N390 Urinary tract infection, site not specified: Secondary | ICD-10-CM | POA: Diagnosis not present

## 2022-12-05 DIAGNOSIS — E1151 Type 2 diabetes mellitus with diabetic peripheral angiopathy without gangrene: Secondary | ICD-10-CM | POA: Diagnosis not present

## 2022-12-05 DIAGNOSIS — K5900 Constipation, unspecified: Secondary | ICD-10-CM | POA: Diagnosis not present

## 2022-12-06 DIAGNOSIS — I129 Hypertensive chronic kidney disease with stage 1 through stage 4 chronic kidney disease, or unspecified chronic kidney disease: Secondary | ICD-10-CM | POA: Diagnosis not present

## 2022-12-06 DIAGNOSIS — G894 Chronic pain syndrome: Secondary | ICD-10-CM | POA: Diagnosis not present

## 2022-12-06 DIAGNOSIS — N390 Urinary tract infection, site not specified: Secondary | ICD-10-CM | POA: Diagnosis not present

## 2022-12-07 DIAGNOSIS — K5900 Constipation, unspecified: Secondary | ICD-10-CM | POA: Diagnosis not present

## 2022-12-07 DIAGNOSIS — R569 Unspecified convulsions: Secondary | ICD-10-CM | POA: Diagnosis not present

## 2022-12-07 DIAGNOSIS — K219 Gastro-esophageal reflux disease without esophagitis: Secondary | ICD-10-CM | POA: Diagnosis not present

## 2022-12-07 DIAGNOSIS — K861 Other chronic pancreatitis: Secondary | ICD-10-CM | POA: Diagnosis not present

## 2022-12-07 DIAGNOSIS — G894 Chronic pain syndrome: Secondary | ICD-10-CM | POA: Diagnosis not present

## 2022-12-07 DIAGNOSIS — I2510 Atherosclerotic heart disease of native coronary artery without angina pectoris: Secondary | ICD-10-CM | POA: Diagnosis not present

## 2022-12-07 DIAGNOSIS — N183 Chronic kidney disease, stage 3 unspecified: Secondary | ICD-10-CM | POA: Diagnosis not present

## 2022-12-07 DIAGNOSIS — M6281 Muscle weakness (generalized): Secondary | ICD-10-CM | POA: Diagnosis not present

## 2022-12-07 DIAGNOSIS — I129 Hypertensive chronic kidney disease with stage 1 through stage 4 chronic kidney disease, or unspecified chronic kidney disease: Secondary | ICD-10-CM | POA: Diagnosis not present

## 2022-12-09 DIAGNOSIS — M6281 Muscle weakness (generalized): Secondary | ICD-10-CM | POA: Diagnosis not present

## 2022-12-09 DIAGNOSIS — I129 Hypertensive chronic kidney disease with stage 1 through stage 4 chronic kidney disease, or unspecified chronic kidney disease: Secondary | ICD-10-CM | POA: Diagnosis not present

## 2022-12-09 DIAGNOSIS — R059 Cough, unspecified: Secondary | ICD-10-CM | POA: Diagnosis not present

## 2022-12-09 DIAGNOSIS — E119 Type 2 diabetes mellitus without complications: Secondary | ICD-10-CM | POA: Diagnosis not present

## 2022-12-10 DIAGNOSIS — M6281 Muscle weakness (generalized): Secondary | ICD-10-CM | POA: Diagnosis not present

## 2022-12-11 DIAGNOSIS — M6281 Muscle weakness (generalized): Secondary | ICD-10-CM | POA: Diagnosis not present

## 2022-12-12 DIAGNOSIS — M6281 Muscle weakness (generalized): Secondary | ICD-10-CM | POA: Diagnosis not present

## 2022-12-13 DIAGNOSIS — M6281 Muscle weakness (generalized): Secondary | ICD-10-CM | POA: Diagnosis not present

## 2022-12-14 DIAGNOSIS — M6281 Muscle weakness (generalized): Secondary | ICD-10-CM | POA: Diagnosis not present

## 2022-12-16 DIAGNOSIS — I129 Hypertensive chronic kidney disease with stage 1 through stage 4 chronic kidney disease, or unspecified chronic kidney disease: Secondary | ICD-10-CM | POA: Diagnosis not present

## 2022-12-16 DIAGNOSIS — E119 Type 2 diabetes mellitus without complications: Secondary | ICD-10-CM | POA: Diagnosis not present

## 2022-12-16 DIAGNOSIS — G894 Chronic pain syndrome: Secondary | ICD-10-CM | POA: Diagnosis not present

## 2022-12-16 DIAGNOSIS — I739 Peripheral vascular disease, unspecified: Secondary | ICD-10-CM | POA: Diagnosis not present

## 2022-12-16 DIAGNOSIS — R300 Dysuria: Secondary | ICD-10-CM | POA: Diagnosis not present

## 2022-12-17 DIAGNOSIS — M6281 Muscle weakness (generalized): Secondary | ICD-10-CM | POA: Diagnosis not present

## 2022-12-18 DIAGNOSIS — I129 Hypertensive chronic kidney disease with stage 1 through stage 4 chronic kidney disease, or unspecified chronic kidney disease: Secondary | ICD-10-CM | POA: Diagnosis not present

## 2022-12-18 DIAGNOSIS — G894 Chronic pain syndrome: Secondary | ICD-10-CM | POA: Diagnosis not present

## 2022-12-18 DIAGNOSIS — R300 Dysuria: Secondary | ICD-10-CM | POA: Diagnosis not present

## 2022-12-18 DIAGNOSIS — E119 Type 2 diabetes mellitus without complications: Secondary | ICD-10-CM | POA: Diagnosis not present

## 2022-12-19 DIAGNOSIS — M6281 Muscle weakness (generalized): Secondary | ICD-10-CM | POA: Diagnosis not present

## 2022-12-20 DIAGNOSIS — M6281 Muscle weakness (generalized): Secondary | ICD-10-CM | POA: Diagnosis not present

## 2022-12-21 DIAGNOSIS — M6281 Muscle weakness (generalized): Secondary | ICD-10-CM | POA: Diagnosis not present

## 2022-12-23 DIAGNOSIS — I151 Hypertension secondary to other renal disorders: Secondary | ICD-10-CM | POA: Diagnosis not present

## 2022-12-23 DIAGNOSIS — D649 Anemia, unspecified: Secondary | ICD-10-CM | POA: Diagnosis not present

## 2022-12-23 DIAGNOSIS — I10 Essential (primary) hypertension: Secondary | ICD-10-CM | POA: Diagnosis not present

## 2022-12-23 DIAGNOSIS — N183 Chronic kidney disease, stage 3 unspecified: Secondary | ICD-10-CM | POA: Diagnosis not present

## 2022-12-23 DIAGNOSIS — I480 Paroxysmal atrial fibrillation: Secondary | ICD-10-CM | POA: Diagnosis not present

## 2022-12-23 DIAGNOSIS — E1140 Type 2 diabetes mellitus with diabetic neuropathy, unspecified: Secondary | ICD-10-CM | POA: Diagnosis not present

## 2022-12-23 DIAGNOSIS — E785 Hyperlipidemia, unspecified: Secondary | ICD-10-CM | POA: Diagnosis not present

## 2022-12-23 DIAGNOSIS — E559 Vitamin D deficiency, unspecified: Secondary | ICD-10-CM | POA: Diagnosis not present

## 2022-12-23 DIAGNOSIS — I2510 Atherosclerotic heart disease of native coronary artery without angina pectoris: Secondary | ICD-10-CM | POA: Diagnosis not present

## 2022-12-23 DIAGNOSIS — E1159 Type 2 diabetes mellitus with other circulatory complications: Secondary | ICD-10-CM | POA: Diagnosis not present

## 2022-12-23 DIAGNOSIS — E1151 Type 2 diabetes mellitus with diabetic peripheral angiopathy without gangrene: Secondary | ICD-10-CM | POA: Diagnosis not present

## 2022-12-25 DIAGNOSIS — M6281 Muscle weakness (generalized): Secondary | ICD-10-CM | POA: Diagnosis not present

## 2022-12-29 DIAGNOSIS — M6281 Muscle weakness (generalized): Secondary | ICD-10-CM | POA: Diagnosis not present

## 2022-12-29 DIAGNOSIS — R1311 Dysphagia, oral phase: Secondary | ICD-10-CM | POA: Diagnosis not present

## 2022-12-29 DIAGNOSIS — R1313 Dysphagia, pharyngeal phase: Secondary | ICD-10-CM | POA: Diagnosis not present

## 2022-12-30 DIAGNOSIS — M6281 Muscle weakness (generalized): Secondary | ICD-10-CM | POA: Diagnosis not present

## 2022-12-30 DIAGNOSIS — R1313 Dysphagia, pharyngeal phase: Secondary | ICD-10-CM | POA: Diagnosis not present

## 2022-12-30 DIAGNOSIS — R1311 Dysphagia, oral phase: Secondary | ICD-10-CM | POA: Diagnosis not present

## 2022-12-31 DIAGNOSIS — G894 Chronic pain syndrome: Secondary | ICD-10-CM | POA: Diagnosis not present

## 2022-12-31 DIAGNOSIS — R059 Cough, unspecified: Secondary | ICD-10-CM | POA: Diagnosis not present

## 2022-12-31 DIAGNOSIS — I129 Hypertensive chronic kidney disease with stage 1 through stage 4 chronic kidney disease, or unspecified chronic kidney disease: Secondary | ICD-10-CM | POA: Diagnosis not present

## 2022-12-31 DIAGNOSIS — E119 Type 2 diabetes mellitus without complications: Secondary | ICD-10-CM | POA: Diagnosis not present

## 2023-01-02 DIAGNOSIS — I10 Essential (primary) hypertension: Secondary | ICD-10-CM | POA: Diagnosis not present

## 2023-01-02 DIAGNOSIS — E1151 Type 2 diabetes mellitus with diabetic peripheral angiopathy without gangrene: Secondary | ICD-10-CM | POA: Diagnosis not present

## 2023-01-02 DIAGNOSIS — K5900 Constipation, unspecified: Secondary | ICD-10-CM | POA: Diagnosis not present

## 2023-01-02 DIAGNOSIS — N390 Urinary tract infection, site not specified: Secondary | ICD-10-CM | POA: Diagnosis not present

## 2023-01-03 DIAGNOSIS — R1311 Dysphagia, oral phase: Secondary | ICD-10-CM | POA: Diagnosis not present

## 2023-01-03 DIAGNOSIS — M6281 Muscle weakness (generalized): Secondary | ICD-10-CM | POA: Diagnosis not present

## 2023-01-03 DIAGNOSIS — R1313 Dysphagia, pharyngeal phase: Secondary | ICD-10-CM | POA: Diagnosis not present

## 2023-01-04 DIAGNOSIS — G4700 Insomnia, unspecified: Secondary | ICD-10-CM | POA: Diagnosis not present

## 2023-01-04 DIAGNOSIS — I739 Peripheral vascular disease, unspecified: Secondary | ICD-10-CM | POA: Diagnosis not present

## 2023-01-04 DIAGNOSIS — K219 Gastro-esophageal reflux disease without esophagitis: Secondary | ICD-10-CM | POA: Diagnosis not present

## 2023-01-04 DIAGNOSIS — R569 Unspecified convulsions: Secondary | ICD-10-CM | POA: Diagnosis not present

## 2023-01-04 DIAGNOSIS — K5900 Constipation, unspecified: Secondary | ICD-10-CM | POA: Diagnosis not present

## 2023-01-04 DIAGNOSIS — I129 Hypertensive chronic kidney disease with stage 1 through stage 4 chronic kidney disease, or unspecified chronic kidney disease: Secondary | ICD-10-CM | POA: Diagnosis not present

## 2023-01-04 DIAGNOSIS — E119 Type 2 diabetes mellitus without complications: Secondary | ICD-10-CM | POA: Diagnosis not present

## 2023-01-04 DIAGNOSIS — I2510 Atherosclerotic heart disease of native coronary artery without angina pectoris: Secondary | ICD-10-CM | POA: Diagnosis not present

## 2023-01-04 DIAGNOSIS — G894 Chronic pain syndrome: Secondary | ICD-10-CM | POA: Diagnosis not present

## 2023-01-05 DIAGNOSIS — M6281 Muscle weakness (generalized): Secondary | ICD-10-CM | POA: Diagnosis not present

## 2023-01-05 DIAGNOSIS — R1311 Dysphagia, oral phase: Secondary | ICD-10-CM | POA: Diagnosis not present

## 2023-01-05 DIAGNOSIS — R1313 Dysphagia, pharyngeal phase: Secondary | ICD-10-CM | POA: Diagnosis not present

## 2023-01-06 DIAGNOSIS — R1311 Dysphagia, oral phase: Secondary | ICD-10-CM | POA: Diagnosis not present

## 2023-01-06 DIAGNOSIS — R1313 Dysphagia, pharyngeal phase: Secondary | ICD-10-CM | POA: Diagnosis not present

## 2023-01-06 DIAGNOSIS — M6281 Muscle weakness (generalized): Secondary | ICD-10-CM | POA: Diagnosis not present

## 2023-01-08 DIAGNOSIS — R1311 Dysphagia, oral phase: Secondary | ICD-10-CM | POA: Diagnosis not present

## 2023-01-08 DIAGNOSIS — R1313 Dysphagia, pharyngeal phase: Secondary | ICD-10-CM | POA: Diagnosis not present

## 2023-01-08 DIAGNOSIS — M6281 Muscle weakness (generalized): Secondary | ICD-10-CM | POA: Diagnosis not present

## 2023-01-09 DIAGNOSIS — R1311 Dysphagia, oral phase: Secondary | ICD-10-CM | POA: Diagnosis not present

## 2023-01-09 DIAGNOSIS — R1313 Dysphagia, pharyngeal phase: Secondary | ICD-10-CM | POA: Diagnosis not present

## 2023-01-09 DIAGNOSIS — M6281 Muscle weakness (generalized): Secondary | ICD-10-CM | POA: Diagnosis not present

## 2023-01-10 DIAGNOSIS — R1311 Dysphagia, oral phase: Secondary | ICD-10-CM | POA: Diagnosis not present

## 2023-01-10 DIAGNOSIS — R1313 Dysphagia, pharyngeal phase: Secondary | ICD-10-CM | POA: Diagnosis not present

## 2023-01-10 DIAGNOSIS — M6281 Muscle weakness (generalized): Secondary | ICD-10-CM | POA: Diagnosis not present

## 2023-01-12 DIAGNOSIS — R1313 Dysphagia, pharyngeal phase: Secondary | ICD-10-CM | POA: Diagnosis not present

## 2023-01-12 DIAGNOSIS — M6281 Muscle weakness (generalized): Secondary | ICD-10-CM | POA: Diagnosis not present

## 2023-01-12 DIAGNOSIS — R1311 Dysphagia, oral phase: Secondary | ICD-10-CM | POA: Diagnosis not present

## 2023-01-13 DIAGNOSIS — R1311 Dysphagia, oral phase: Secondary | ICD-10-CM | POA: Diagnosis not present

## 2023-01-13 DIAGNOSIS — M6281 Muscle weakness (generalized): Secondary | ICD-10-CM | POA: Diagnosis not present

## 2023-01-13 DIAGNOSIS — R1313 Dysphagia, pharyngeal phase: Secondary | ICD-10-CM | POA: Diagnosis not present

## 2023-01-15 DIAGNOSIS — M6281 Muscle weakness (generalized): Secondary | ICD-10-CM | POA: Diagnosis not present

## 2023-01-15 DIAGNOSIS — R1311 Dysphagia, oral phase: Secondary | ICD-10-CM | POA: Diagnosis not present

## 2023-01-15 DIAGNOSIS — R1313 Dysphagia, pharyngeal phase: Secondary | ICD-10-CM | POA: Diagnosis not present

## 2023-01-16 DIAGNOSIS — G894 Chronic pain syndrome: Secondary | ICD-10-CM | POA: Diagnosis not present

## 2023-01-16 DIAGNOSIS — K219 Gastro-esophageal reflux disease without esophagitis: Secondary | ICD-10-CM | POA: Diagnosis not present

## 2023-01-16 DIAGNOSIS — M6281 Muscle weakness (generalized): Secondary | ICD-10-CM | POA: Diagnosis not present

## 2023-01-16 DIAGNOSIS — R1313 Dysphagia, pharyngeal phase: Secondary | ICD-10-CM | POA: Diagnosis not present

## 2023-01-16 DIAGNOSIS — I129 Hypertensive chronic kidney disease with stage 1 through stage 4 chronic kidney disease, or unspecified chronic kidney disease: Secondary | ICD-10-CM | POA: Diagnosis not present

## 2023-01-16 DIAGNOSIS — R1311 Dysphagia, oral phase: Secondary | ICD-10-CM | POA: Diagnosis not present

## 2023-01-17 DIAGNOSIS — R1313 Dysphagia, pharyngeal phase: Secondary | ICD-10-CM | POA: Diagnosis not present

## 2023-01-17 DIAGNOSIS — M6281 Muscle weakness (generalized): Secondary | ICD-10-CM | POA: Diagnosis not present

## 2023-01-17 DIAGNOSIS — R1311 Dysphagia, oral phase: Secondary | ICD-10-CM | POA: Diagnosis not present

## 2023-01-18 DIAGNOSIS — R1313 Dysphagia, pharyngeal phase: Secondary | ICD-10-CM | POA: Diagnosis not present

## 2023-01-18 DIAGNOSIS — M6281 Muscle weakness (generalized): Secondary | ICD-10-CM | POA: Diagnosis not present

## 2023-01-18 DIAGNOSIS — R1311 Dysphagia, oral phase: Secondary | ICD-10-CM | POA: Diagnosis not present

## 2023-01-20 DIAGNOSIS — R1311 Dysphagia, oral phase: Secondary | ICD-10-CM | POA: Diagnosis not present

## 2023-01-20 DIAGNOSIS — R1313 Dysphagia, pharyngeal phase: Secondary | ICD-10-CM | POA: Diagnosis not present

## 2023-01-20 DIAGNOSIS — M6281 Muscle weakness (generalized): Secondary | ICD-10-CM | POA: Diagnosis not present

## 2023-01-21 DIAGNOSIS — I480 Paroxysmal atrial fibrillation: Secondary | ICD-10-CM | POA: Diagnosis not present

## 2023-01-21 DIAGNOSIS — I10 Essential (primary) hypertension: Secondary | ICD-10-CM | POA: Diagnosis not present

## 2023-01-21 DIAGNOSIS — I152 Hypertension secondary to endocrine disorders: Secondary | ICD-10-CM | POA: Diagnosis not present

## 2023-01-21 DIAGNOSIS — E785 Hyperlipidemia, unspecified: Secondary | ICD-10-CM | POA: Diagnosis not present

## 2023-01-21 DIAGNOSIS — N183 Chronic kidney disease, stage 3 unspecified: Secondary | ICD-10-CM | POA: Diagnosis not present

## 2023-01-21 DIAGNOSIS — E1159 Type 2 diabetes mellitus with other circulatory complications: Secondary | ICD-10-CM | POA: Diagnosis not present

## 2023-01-21 DIAGNOSIS — D649 Anemia, unspecified: Secondary | ICD-10-CM | POA: Diagnosis not present

## 2023-01-21 DIAGNOSIS — I2510 Atherosclerotic heart disease of native coronary artery without angina pectoris: Secondary | ICD-10-CM | POA: Diagnosis not present

## 2023-01-21 DIAGNOSIS — E559 Vitamin D deficiency, unspecified: Secondary | ICD-10-CM | POA: Diagnosis not present

## 2023-01-21 DIAGNOSIS — E1151 Type 2 diabetes mellitus with diabetic peripheral angiopathy without gangrene: Secondary | ICD-10-CM | POA: Diagnosis not present

## 2023-01-21 DIAGNOSIS — E1140 Type 2 diabetes mellitus with diabetic neuropathy, unspecified: Secondary | ICD-10-CM | POA: Diagnosis not present

## 2023-01-24 DIAGNOSIS — M6281 Muscle weakness (generalized): Secondary | ICD-10-CM | POA: Diagnosis not present

## 2023-01-24 DIAGNOSIS — R1311 Dysphagia, oral phase: Secondary | ICD-10-CM | POA: Diagnosis not present

## 2023-01-24 DIAGNOSIS — R1313 Dysphagia, pharyngeal phase: Secondary | ICD-10-CM | POA: Diagnosis not present

## 2023-01-26 DIAGNOSIS — R1311 Dysphagia, oral phase: Secondary | ICD-10-CM | POA: Diagnosis not present

## 2023-01-26 DIAGNOSIS — R1313 Dysphagia, pharyngeal phase: Secondary | ICD-10-CM | POA: Diagnosis not present

## 2023-01-26 DIAGNOSIS — M6281 Muscle weakness (generalized): Secondary | ICD-10-CM | POA: Diagnosis not present

## 2023-01-27 DIAGNOSIS — R1313 Dysphagia, pharyngeal phase: Secondary | ICD-10-CM | POA: Diagnosis not present

## 2023-01-27 DIAGNOSIS — M6281 Muscle weakness (generalized): Secondary | ICD-10-CM | POA: Diagnosis not present

## 2023-01-27 DIAGNOSIS — R1311 Dysphagia, oral phase: Secondary | ICD-10-CM | POA: Diagnosis not present

## 2023-01-28 DIAGNOSIS — R1313 Dysphagia, pharyngeal phase: Secondary | ICD-10-CM | POA: Diagnosis not present

## 2023-01-28 DIAGNOSIS — R1311 Dysphagia, oral phase: Secondary | ICD-10-CM | POA: Diagnosis not present

## 2023-01-28 DIAGNOSIS — M6281 Muscle weakness (generalized): Secondary | ICD-10-CM | POA: Diagnosis not present

## 2023-01-29 DIAGNOSIS — R1311 Dysphagia, oral phase: Secondary | ICD-10-CM | POA: Diagnosis not present

## 2023-01-29 DIAGNOSIS — R1313 Dysphagia, pharyngeal phase: Secondary | ICD-10-CM | POA: Diagnosis not present

## 2023-01-29 DIAGNOSIS — M6281 Muscle weakness (generalized): Secondary | ICD-10-CM | POA: Diagnosis not present

## 2023-01-30 DIAGNOSIS — R1311 Dysphagia, oral phase: Secondary | ICD-10-CM | POA: Diagnosis not present

## 2023-01-30 DIAGNOSIS — R1313 Dysphagia, pharyngeal phase: Secondary | ICD-10-CM | POA: Diagnosis not present

## 2023-01-30 DIAGNOSIS — M6281 Muscle weakness (generalized): Secondary | ICD-10-CM | POA: Diagnosis not present

## 2023-01-31 DIAGNOSIS — R1311 Dysphagia, oral phase: Secondary | ICD-10-CM | POA: Diagnosis not present

## 2023-01-31 DIAGNOSIS — R1313 Dysphagia, pharyngeal phase: Secondary | ICD-10-CM | POA: Diagnosis not present

## 2023-01-31 DIAGNOSIS — M6281 Muscle weakness (generalized): Secondary | ICD-10-CM | POA: Diagnosis not present

## 2023-02-01 DIAGNOSIS — M6281 Muscle weakness (generalized): Secondary | ICD-10-CM | POA: Diagnosis not present

## 2023-02-01 DIAGNOSIS — I739 Peripheral vascular disease, unspecified: Secondary | ICD-10-CM | POA: Diagnosis not present

## 2023-02-01 DIAGNOSIS — E119 Type 2 diabetes mellitus without complications: Secondary | ICD-10-CM | POA: Diagnosis not present

## 2023-02-01 DIAGNOSIS — K219 Gastro-esophageal reflux disease without esophagitis: Secondary | ICD-10-CM | POA: Diagnosis not present

## 2023-02-01 DIAGNOSIS — I129 Hypertensive chronic kidney disease with stage 1 through stage 4 chronic kidney disease, or unspecified chronic kidney disease: Secondary | ICD-10-CM | POA: Diagnosis not present

## 2023-02-01 DIAGNOSIS — K5900 Constipation, unspecified: Secondary | ICD-10-CM | POA: Diagnosis not present

## 2023-02-01 DIAGNOSIS — R1311 Dysphagia, oral phase: Secondary | ICD-10-CM | POA: Diagnosis not present

## 2023-02-01 DIAGNOSIS — R1313 Dysphagia, pharyngeal phase: Secondary | ICD-10-CM | POA: Diagnosis not present

## 2023-02-01 DIAGNOSIS — G894 Chronic pain syndrome: Secondary | ICD-10-CM | POA: Diagnosis not present

## 2023-02-02 DIAGNOSIS — M6281 Muscle weakness (generalized): Secondary | ICD-10-CM | POA: Diagnosis not present

## 2023-02-02 DIAGNOSIS — R1311 Dysphagia, oral phase: Secondary | ICD-10-CM | POA: Diagnosis not present

## 2023-02-02 DIAGNOSIS — R1313 Dysphagia, pharyngeal phase: Secondary | ICD-10-CM | POA: Diagnosis not present

## 2023-02-05 DIAGNOSIS — R1313 Dysphagia, pharyngeal phase: Secondary | ICD-10-CM | POA: Diagnosis not present

## 2023-02-05 DIAGNOSIS — M6281 Muscle weakness (generalized): Secondary | ICD-10-CM | POA: Diagnosis not present

## 2023-02-05 DIAGNOSIS — R1311 Dysphagia, oral phase: Secondary | ICD-10-CM | POA: Diagnosis not present

## 2023-02-06 DIAGNOSIS — J309 Allergic rhinitis, unspecified: Secondary | ICD-10-CM | POA: Diagnosis not present

## 2023-02-06 DIAGNOSIS — K5900 Constipation, unspecified: Secondary | ICD-10-CM | POA: Diagnosis not present

## 2023-02-06 DIAGNOSIS — R1311 Dysphagia, oral phase: Secondary | ICD-10-CM | POA: Diagnosis not present

## 2023-02-06 DIAGNOSIS — E1151 Type 2 diabetes mellitus with diabetic peripheral angiopathy without gangrene: Secondary | ICD-10-CM | POA: Diagnosis not present

## 2023-02-06 DIAGNOSIS — R1313 Dysphagia, pharyngeal phase: Secondary | ICD-10-CM | POA: Diagnosis not present

## 2023-02-06 DIAGNOSIS — I10 Essential (primary) hypertension: Secondary | ICD-10-CM | POA: Diagnosis not present

## 2023-02-06 DIAGNOSIS — M6281 Muscle weakness (generalized): Secondary | ICD-10-CM | POA: Diagnosis not present

## 2023-02-10 DIAGNOSIS — L84 Corns and callosities: Secondary | ICD-10-CM | POA: Diagnosis not present

## 2023-02-10 DIAGNOSIS — R1313 Dysphagia, pharyngeal phase: Secondary | ICD-10-CM | POA: Diagnosis not present

## 2023-02-10 DIAGNOSIS — M6281 Muscle weakness (generalized): Secondary | ICD-10-CM | POA: Diagnosis not present

## 2023-02-10 DIAGNOSIS — L603 Nail dystrophy: Secondary | ICD-10-CM | POA: Diagnosis not present

## 2023-02-10 DIAGNOSIS — Z7984 Long term (current) use of oral hypoglycemic drugs: Secondary | ICD-10-CM | POA: Diagnosis not present

## 2023-02-10 DIAGNOSIS — E1151 Type 2 diabetes mellitus with diabetic peripheral angiopathy without gangrene: Secondary | ICD-10-CM | POA: Diagnosis not present

## 2023-02-10 DIAGNOSIS — L602 Onychogryphosis: Secondary | ICD-10-CM | POA: Diagnosis not present

## 2023-02-10 DIAGNOSIS — R1311 Dysphagia, oral phase: Secondary | ICD-10-CM | POA: Diagnosis not present

## 2023-02-11 DIAGNOSIS — M6281 Muscle weakness (generalized): Secondary | ICD-10-CM | POA: Diagnosis not present

## 2023-02-11 DIAGNOSIS — R1313 Dysphagia, pharyngeal phase: Secondary | ICD-10-CM | POA: Diagnosis not present

## 2023-02-11 DIAGNOSIS — R1311 Dysphagia, oral phase: Secondary | ICD-10-CM | POA: Diagnosis not present

## 2023-02-12 DIAGNOSIS — R1313 Dysphagia, pharyngeal phase: Secondary | ICD-10-CM | POA: Diagnosis not present

## 2023-02-12 DIAGNOSIS — M6281 Muscle weakness (generalized): Secondary | ICD-10-CM | POA: Diagnosis not present

## 2023-02-12 DIAGNOSIS — R1311 Dysphagia, oral phase: Secondary | ICD-10-CM | POA: Diagnosis not present

## 2023-02-13 DIAGNOSIS — M6281 Muscle weakness (generalized): Secondary | ICD-10-CM | POA: Diagnosis not present

## 2023-02-13 DIAGNOSIS — R1313 Dysphagia, pharyngeal phase: Secondary | ICD-10-CM | POA: Diagnosis not present

## 2023-02-13 DIAGNOSIS — R1311 Dysphagia, oral phase: Secondary | ICD-10-CM | POA: Diagnosis not present

## 2023-02-14 DIAGNOSIS — G894 Chronic pain syndrome: Secondary | ICD-10-CM | POA: Diagnosis not present

## 2023-02-14 DIAGNOSIS — K219 Gastro-esophageal reflux disease without esophagitis: Secondary | ICD-10-CM | POA: Diagnosis not present

## 2023-02-14 DIAGNOSIS — I129 Hypertensive chronic kidney disease with stage 1 through stage 4 chronic kidney disease, or unspecified chronic kidney disease: Secondary | ICD-10-CM | POA: Diagnosis not present

## 2023-02-16 DIAGNOSIS — R1110 Vomiting, unspecified: Secondary | ICD-10-CM | POA: Diagnosis not present

## 2023-02-17 DIAGNOSIS — K219 Gastro-esophageal reflux disease without esophagitis: Secondary | ICD-10-CM | POA: Diagnosis not present

## 2023-02-17 DIAGNOSIS — I2510 Atherosclerotic heart disease of native coronary artery without angina pectoris: Secondary | ICD-10-CM | POA: Diagnosis not present

## 2023-02-17 DIAGNOSIS — I129 Hypertensive chronic kidney disease with stage 1 through stage 4 chronic kidney disease, or unspecified chronic kidney disease: Secondary | ICD-10-CM | POA: Diagnosis not present

## 2023-02-17 DIAGNOSIS — M6281 Muscle weakness (generalized): Secondary | ICD-10-CM | POA: Diagnosis not present

## 2023-02-17 DIAGNOSIS — K5900 Constipation, unspecified: Secondary | ICD-10-CM | POA: Diagnosis not present

## 2023-02-17 DIAGNOSIS — R1311 Dysphagia, oral phase: Secondary | ICD-10-CM | POA: Diagnosis not present

## 2023-02-17 DIAGNOSIS — G894 Chronic pain syndrome: Secondary | ICD-10-CM | POA: Diagnosis not present

## 2023-02-17 DIAGNOSIS — R1313 Dysphagia, pharyngeal phase: Secondary | ICD-10-CM | POA: Diagnosis not present

## 2023-02-18 DIAGNOSIS — M6281 Muscle weakness (generalized): Secondary | ICD-10-CM | POA: Diagnosis not present

## 2023-02-18 DIAGNOSIS — R1313 Dysphagia, pharyngeal phase: Secondary | ICD-10-CM | POA: Diagnosis not present

## 2023-02-18 DIAGNOSIS — R1311 Dysphagia, oral phase: Secondary | ICD-10-CM | POA: Diagnosis not present

## 2023-02-19 DIAGNOSIS — M6281 Muscle weakness (generalized): Secondary | ICD-10-CM | POA: Diagnosis not present

## 2023-02-19 DIAGNOSIS — R1313 Dysphagia, pharyngeal phase: Secondary | ICD-10-CM | POA: Diagnosis not present

## 2023-02-19 DIAGNOSIS — R1311 Dysphagia, oral phase: Secondary | ICD-10-CM | POA: Diagnosis not present

## 2023-02-21 DIAGNOSIS — R1311 Dysphagia, oral phase: Secondary | ICD-10-CM | POA: Diagnosis not present

## 2023-02-21 DIAGNOSIS — R1313 Dysphagia, pharyngeal phase: Secondary | ICD-10-CM | POA: Diagnosis not present

## 2023-02-21 DIAGNOSIS — M6281 Muscle weakness (generalized): Secondary | ICD-10-CM | POA: Diagnosis not present

## 2023-02-22 DIAGNOSIS — K219 Gastro-esophageal reflux disease without esophagitis: Secondary | ICD-10-CM | POA: Diagnosis not present

## 2023-02-22 DIAGNOSIS — R300 Dysuria: Secondary | ICD-10-CM | POA: Diagnosis not present

## 2023-02-22 DIAGNOSIS — G894 Chronic pain syndrome: Secondary | ICD-10-CM | POA: Diagnosis not present

## 2023-02-22 DIAGNOSIS — I129 Hypertensive chronic kidney disease with stage 1 through stage 4 chronic kidney disease, or unspecified chronic kidney disease: Secondary | ICD-10-CM | POA: Diagnosis not present

## 2023-02-22 DIAGNOSIS — G4700 Insomnia, unspecified: Secondary | ICD-10-CM | POA: Diagnosis not present

## 2023-02-22 DIAGNOSIS — R569 Unspecified convulsions: Secondary | ICD-10-CM | POA: Diagnosis not present

## 2023-02-22 DIAGNOSIS — K5900 Constipation, unspecified: Secondary | ICD-10-CM | POA: Diagnosis not present

## 2023-02-22 DIAGNOSIS — I2510 Atherosclerotic heart disease of native coronary artery without angina pectoris: Secondary | ICD-10-CM | POA: Diagnosis not present

## 2023-02-22 DIAGNOSIS — I739 Peripheral vascular disease, unspecified: Secondary | ICD-10-CM | POA: Diagnosis not present

## 2023-02-25 DIAGNOSIS — R1311 Dysphagia, oral phase: Secondary | ICD-10-CM | POA: Diagnosis not present

## 2023-02-25 DIAGNOSIS — M6281 Muscle weakness (generalized): Secondary | ICD-10-CM | POA: Diagnosis not present

## 2023-02-25 DIAGNOSIS — R1313 Dysphagia, pharyngeal phase: Secondary | ICD-10-CM | POA: Diagnosis not present

## 2023-02-26 DIAGNOSIS — K219 Gastro-esophageal reflux disease without esophagitis: Secondary | ICD-10-CM | POA: Diagnosis not present

## 2023-02-26 DIAGNOSIS — I480 Paroxysmal atrial fibrillation: Secondary | ICD-10-CM | POA: Diagnosis not present

## 2023-02-26 DIAGNOSIS — E1159 Type 2 diabetes mellitus with other circulatory complications: Secondary | ICD-10-CM | POA: Diagnosis not present

## 2023-02-26 DIAGNOSIS — M6281 Muscle weakness (generalized): Secondary | ICD-10-CM | POA: Diagnosis not present

## 2023-02-26 DIAGNOSIS — Z9189 Other specified personal risk factors, not elsewhere classified: Secondary | ICD-10-CM | POA: Diagnosis not present

## 2023-02-26 DIAGNOSIS — E1140 Type 2 diabetes mellitus with diabetic neuropathy, unspecified: Secondary | ICD-10-CM | POA: Diagnosis not present

## 2023-02-26 DIAGNOSIS — R1313 Dysphagia, pharyngeal phase: Secondary | ICD-10-CM | POA: Diagnosis not present

## 2023-02-26 DIAGNOSIS — I10 Essential (primary) hypertension: Secondary | ICD-10-CM | POA: Diagnosis not present

## 2023-02-26 DIAGNOSIS — R1311 Dysphagia, oral phase: Secondary | ICD-10-CM | POA: Diagnosis not present

## 2023-02-26 DIAGNOSIS — E1151 Type 2 diabetes mellitus with diabetic peripheral angiopathy without gangrene: Secondary | ICD-10-CM | POA: Diagnosis not present

## 2023-02-26 DIAGNOSIS — I2510 Atherosclerotic heart disease of native coronary artery without angina pectoris: Secondary | ICD-10-CM | POA: Diagnosis not present

## 2023-02-26 DIAGNOSIS — N183 Chronic kidney disease, stage 3 unspecified: Secondary | ICD-10-CM | POA: Diagnosis not present

## 2023-02-26 DIAGNOSIS — I152 Hypertension secondary to endocrine disorders: Secondary | ICD-10-CM | POA: Diagnosis not present

## 2023-02-26 DIAGNOSIS — E119 Type 2 diabetes mellitus without complications: Secondary | ICD-10-CM | POA: Diagnosis not present

## 2023-02-26 DIAGNOSIS — E559 Vitamin D deficiency, unspecified: Secondary | ICD-10-CM | POA: Diagnosis not present

## 2023-02-26 DIAGNOSIS — G894 Chronic pain syndrome: Secondary | ICD-10-CM | POA: Diagnosis not present

## 2023-02-26 DIAGNOSIS — E785 Hyperlipidemia, unspecified: Secondary | ICD-10-CM | POA: Diagnosis not present

## 2023-02-26 DIAGNOSIS — D649 Anemia, unspecified: Secondary | ICD-10-CM | POA: Diagnosis not present

## 2023-02-26 DIAGNOSIS — I129 Hypertensive chronic kidney disease with stage 1 through stage 4 chronic kidney disease, or unspecified chronic kidney disease: Secondary | ICD-10-CM | POA: Diagnosis not present

## 2023-02-27 DIAGNOSIS — M6281 Muscle weakness (generalized): Secondary | ICD-10-CM | POA: Diagnosis not present

## 2023-02-27 DIAGNOSIS — R1311 Dysphagia, oral phase: Secondary | ICD-10-CM | POA: Diagnosis not present

## 2023-02-27 DIAGNOSIS — R1313 Dysphagia, pharyngeal phase: Secondary | ICD-10-CM | POA: Diagnosis not present

## 2023-02-28 DIAGNOSIS — M6281 Muscle weakness (generalized): Secondary | ICD-10-CM | POA: Diagnosis not present

## 2023-02-28 DIAGNOSIS — R1313 Dysphagia, pharyngeal phase: Secondary | ICD-10-CM | POA: Diagnosis not present

## 2023-02-28 DIAGNOSIS — R1311 Dysphagia, oral phase: Secondary | ICD-10-CM | POA: Diagnosis not present

## 2023-03-01 DIAGNOSIS — G894 Chronic pain syndrome: Secondary | ICD-10-CM | POA: Diagnosis not present

## 2023-03-01 DIAGNOSIS — K5900 Constipation, unspecified: Secondary | ICD-10-CM | POA: Diagnosis not present

## 2023-03-01 DIAGNOSIS — I739 Peripheral vascular disease, unspecified: Secondary | ICD-10-CM | POA: Diagnosis not present

## 2023-03-01 DIAGNOSIS — I129 Hypertensive chronic kidney disease with stage 1 through stage 4 chronic kidney disease, or unspecified chronic kidney disease: Secondary | ICD-10-CM | POA: Diagnosis not present

## 2023-03-01 DIAGNOSIS — K219 Gastro-esophageal reflux disease without esophagitis: Secondary | ICD-10-CM | POA: Diagnosis not present

## 2023-03-01 DIAGNOSIS — E119 Type 2 diabetes mellitus without complications: Secondary | ICD-10-CM | POA: Diagnosis not present

## 2023-03-01 DIAGNOSIS — I2510 Atherosclerotic heart disease of native coronary artery without angina pectoris: Secondary | ICD-10-CM | POA: Diagnosis not present

## 2023-03-03 DIAGNOSIS — R1313 Dysphagia, pharyngeal phase: Secondary | ICD-10-CM | POA: Diagnosis not present

## 2023-03-03 DIAGNOSIS — R1311 Dysphagia, oral phase: Secondary | ICD-10-CM | POA: Diagnosis not present

## 2023-03-03 DIAGNOSIS — M6281 Muscle weakness (generalized): Secondary | ICD-10-CM | POA: Diagnosis not present

## 2023-03-05 ENCOUNTER — Telehealth: Payer: Self-pay | Admitting: Gastroenterology

## 2023-03-05 NOTE — Telephone Encounter (Signed)
 Patient's daughter called requesting to speak with a nurse stating patient is losing a lot of weight and is scheduled for 03/25/2023 at 11:00 AM. Please advise

## 2023-03-05 NOTE — Telephone Encounter (Signed)
 The pts daughter wanted to know if there are any sooner appts with [facility] for the pt.  I did look and there is nothing sooner.  She will speak with the nursing facility and follow the instructions given.  She will keep appt as planned.

## 2023-03-14 ENCOUNTER — Other Ambulatory Visit: Payer: Self-pay

## 2023-03-14 ENCOUNTER — Inpatient Hospital Stay (HOSPITAL_COMMUNITY)
Admission: EM | Admit: 2023-03-14 | Discharge: 2023-03-25 | DRG: 327 | Disposition: A | Discharge status: Skilled Nursing Facility | Payer: 59 | Source: Skilled Nursing Facility | Attending: Family Medicine | Admitting: Family Medicine

## 2023-03-14 ENCOUNTER — Emergency Department (HOSPITAL_COMMUNITY): Payer: 59

## 2023-03-14 DIAGNOSIS — D696 Thrombocytopenia, unspecified: Secondary | ICD-10-CM | POA: Diagnosis not present

## 2023-03-14 DIAGNOSIS — B3781 Candidal esophagitis: Secondary | ICD-10-CM | POA: Diagnosis present

## 2023-03-14 DIAGNOSIS — Z7401 Bed confinement status: Secondary | ICD-10-CM | POA: Diagnosis not present

## 2023-03-14 DIAGNOSIS — N401 Enlarged prostate with lower urinary tract symptoms: Secondary | ICD-10-CM | POA: Diagnosis not present

## 2023-03-14 DIAGNOSIS — K220 Achalasia of cardia: Principal | ICD-10-CM | POA: Diagnosis present

## 2023-03-14 DIAGNOSIS — N400 Enlarged prostate without lower urinary tract symptoms: Secondary | ICD-10-CM | POA: Diagnosis present

## 2023-03-14 DIAGNOSIS — R634 Abnormal weight loss: Secondary | ICD-10-CM | POA: Diagnosis not present

## 2023-03-14 DIAGNOSIS — I2510 Atherosclerotic heart disease of native coronary artery without angina pectoris: Secondary | ICD-10-CM | POA: Diagnosis present

## 2023-03-14 DIAGNOSIS — Z66 Do not resuscitate: Secondary | ICD-10-CM | POA: Diagnosis present

## 2023-03-14 DIAGNOSIS — E1140 Type 2 diabetes mellitus with diabetic neuropathy, unspecified: Secondary | ICD-10-CM | POA: Diagnosis not present

## 2023-03-14 DIAGNOSIS — E1169 Type 2 diabetes mellitus with other specified complication: Secondary | ICD-10-CM | POA: Diagnosis not present

## 2023-03-14 DIAGNOSIS — R1311 Dysphagia, oral phase: Secondary | ICD-10-CM | POA: Diagnosis not present

## 2023-03-14 DIAGNOSIS — Z7901 Long term (current) use of anticoagulants: Secondary | ICD-10-CM

## 2023-03-14 DIAGNOSIS — E1159 Type 2 diabetes mellitus with other circulatory complications: Secondary | ICD-10-CM | POA: Diagnosis present

## 2023-03-14 DIAGNOSIS — E1151 Type 2 diabetes mellitus with diabetic peripheral angiopathy without gangrene: Secondary | ICD-10-CM | POA: Diagnosis present

## 2023-03-14 DIAGNOSIS — R627 Adult failure to thrive: Secondary | ICD-10-CM | POA: Diagnosis present

## 2023-03-14 DIAGNOSIS — Z8249 Family history of ischemic heart disease and other diseases of the circulatory system: Secondary | ICD-10-CM

## 2023-03-14 DIAGNOSIS — Q433 Congenital malformations of intestinal fixation: Secondary | ICD-10-CM | POA: Diagnosis not present

## 2023-03-14 DIAGNOSIS — I714 Abdominal aortic aneurysm, without rupture, unspecified: Secondary | ICD-10-CM | POA: Diagnosis present

## 2023-03-14 DIAGNOSIS — R110 Nausea: Secondary | ICD-10-CM | POA: Diagnosis present

## 2023-03-14 DIAGNOSIS — K5641 Fecal impaction: Secondary | ICD-10-CM | POA: Diagnosis not present

## 2023-03-14 DIAGNOSIS — N1831 Chronic kidney disease, stage 3a: Secondary | ICD-10-CM | POA: Diagnosis present

## 2023-03-14 DIAGNOSIS — Z87891 Personal history of nicotine dependence: Secondary | ICD-10-CM | POA: Diagnosis not present

## 2023-03-14 DIAGNOSIS — Z79899 Other long term (current) drug therapy: Secondary | ICD-10-CM

## 2023-03-14 DIAGNOSIS — K6289 Other specified diseases of anus and rectum: Secondary | ICD-10-CM | POA: Diagnosis not present

## 2023-03-14 DIAGNOSIS — E7800 Pure hypercholesterolemia, unspecified: Secondary | ICD-10-CM | POA: Diagnosis present

## 2023-03-14 DIAGNOSIS — F01A0 Vascular dementia, mild, without behavioral disturbance, psychotic disturbance, mood disturbance, and anxiety: Secondary | ICD-10-CM | POA: Diagnosis present

## 2023-03-14 DIAGNOSIS — N419 Inflammatory disease of prostate, unspecified: Secondary | ICD-10-CM | POA: Diagnosis present

## 2023-03-14 DIAGNOSIS — R531 Weakness: Secondary | ICD-10-CM | POA: Diagnosis not present

## 2023-03-14 DIAGNOSIS — Z555 Less than a high school diploma: Secondary | ICD-10-CM

## 2023-03-14 DIAGNOSIS — Z961 Presence of intraocular lens: Secondary | ICD-10-CM | POA: Diagnosis present

## 2023-03-14 DIAGNOSIS — R3914 Feeling of incomplete bladder emptying: Secondary | ICD-10-CM | POA: Diagnosis not present

## 2023-03-14 DIAGNOSIS — R918 Other nonspecific abnormal finding of lung field: Secondary | ICD-10-CM | POA: Diagnosis not present

## 2023-03-14 DIAGNOSIS — E876 Hypokalemia: Secondary | ICD-10-CM | POA: Diagnosis present

## 2023-03-14 DIAGNOSIS — F0150 Vascular dementia without behavioral disturbance: Secondary | ICD-10-CM | POA: Diagnosis present

## 2023-03-14 DIAGNOSIS — I7143 Infrarenal abdominal aortic aneurysm, without rupture: Secondary | ICD-10-CM | POA: Diagnosis present

## 2023-03-14 DIAGNOSIS — Z888 Allergy status to other drugs, medicaments and biological substances status: Secondary | ICD-10-CM

## 2023-03-14 DIAGNOSIS — E860 Dehydration: Secondary | ICD-10-CM | POA: Diagnosis present

## 2023-03-14 DIAGNOSIS — K222 Esophageal obstruction: Secondary | ICD-10-CM | POA: Diagnosis not present

## 2023-03-14 DIAGNOSIS — D631 Anemia in chronic kidney disease: Secondary | ICD-10-CM | POA: Diagnosis present

## 2023-03-14 DIAGNOSIS — K2289 Other specified disease of esophagus: Secondary | ICD-10-CM | POA: Diagnosis not present

## 2023-03-14 DIAGNOSIS — I480 Paroxysmal atrial fibrillation: Secondary | ICD-10-CM | POA: Diagnosis present

## 2023-03-14 DIAGNOSIS — E1122 Type 2 diabetes mellitus with diabetic chronic kidney disease: Secondary | ICD-10-CM | POA: Diagnosis present

## 2023-03-14 DIAGNOSIS — I739 Peripheral vascular disease, unspecified: Secondary | ICD-10-CM | POA: Diagnosis present

## 2023-03-14 DIAGNOSIS — I152 Hypertension secondary to endocrine disorders: Secondary | ICD-10-CM | POA: Diagnosis present

## 2023-03-14 DIAGNOSIS — F419 Anxiety disorder, unspecified: Secondary | ICD-10-CM | POA: Diagnosis present

## 2023-03-14 DIAGNOSIS — I129 Hypertensive chronic kidney disease with stage 1 through stage 4 chronic kidney disease, or unspecified chronic kidney disease: Secondary | ICD-10-CM | POA: Diagnosis not present

## 2023-03-14 DIAGNOSIS — N183 Chronic kidney disease, stage 3 unspecified: Secondary | ICD-10-CM | POA: Diagnosis present

## 2023-03-14 DIAGNOSIS — R1310 Dysphagia, unspecified: Secondary | ICD-10-CM | POA: Diagnosis not present

## 2023-03-14 DIAGNOSIS — K219 Gastro-esophageal reflux disease without esophagitis: Secondary | ICD-10-CM | POA: Diagnosis present

## 2023-03-14 DIAGNOSIS — T17920A Food in respiratory tract, part unspecified causing asphyxiation, initial encounter: Secondary | ICD-10-CM | POA: Diagnosis not present

## 2023-03-14 DIAGNOSIS — Z862 Personal history of diseases of the blood and blood-forming organs and certain disorders involving the immune mechanism: Secondary | ICD-10-CM

## 2023-03-14 DIAGNOSIS — N179 Acute kidney failure, unspecified: Secondary | ICD-10-CM | POA: Diagnosis present

## 2023-03-14 DIAGNOSIS — E119 Type 2 diabetes mellitus without complications: Secondary | ICD-10-CM | POA: Diagnosis not present

## 2023-03-14 DIAGNOSIS — G40909 Epilepsy, unspecified, not intractable, without status epilepticus: Secondary | ICD-10-CM | POA: Diagnosis present

## 2023-03-14 DIAGNOSIS — Z9841 Cataract extraction status, right eye: Secondary | ICD-10-CM

## 2023-03-14 DIAGNOSIS — I499 Cardiac arrhythmia, unspecified: Secondary | ICD-10-CM | POA: Diagnosis not present

## 2023-03-14 DIAGNOSIS — I4891 Unspecified atrial fibrillation: Secondary | ICD-10-CM | POA: Diagnosis not present

## 2023-03-14 DIAGNOSIS — Z841 Family history of disorders of kidney and ureter: Secondary | ICD-10-CM

## 2023-03-14 DIAGNOSIS — R112 Nausea with vomiting, unspecified: Secondary | ICD-10-CM | POA: Diagnosis not present

## 2023-03-14 DIAGNOSIS — R1319 Other dysphagia: Secondary | ICD-10-CM | POA: Diagnosis not present

## 2023-03-14 DIAGNOSIS — Z8673 Personal history of transient ischemic attack (TIA), and cerebral infarction without residual deficits: Secondary | ICD-10-CM

## 2023-03-14 DIAGNOSIS — R1110 Vomiting, unspecified: Secondary | ICD-10-CM | POA: Diagnosis not present

## 2023-03-14 DIAGNOSIS — Z9842 Cataract extraction status, left eye: Secondary | ICD-10-CM

## 2023-03-14 DIAGNOSIS — H9193 Unspecified hearing loss, bilateral: Secondary | ICD-10-CM | POA: Diagnosis present

## 2023-03-14 DIAGNOSIS — Z743 Need for continuous supervision: Secondary | ICD-10-CM | POA: Diagnosis not present

## 2023-03-14 DIAGNOSIS — R569 Unspecified convulsions: Secondary | ICD-10-CM

## 2023-03-14 LAB — COMPREHENSIVE METABOLIC PANEL
ALT: 19 U/L (ref 0–44)
AST: 27 U/L (ref 15–41)
Albumin: 3.1 g/dL — ABNORMAL LOW (ref 3.5–5.0)
Alkaline Phosphatase: 89 U/L (ref 38–126)
Anion gap: 10 (ref 5–15)
BUN: 31 mg/dL — ABNORMAL HIGH (ref 8–23)
CO2: 24 mmol/L (ref 22–32)
Calcium: 8.9 mg/dL (ref 8.9–10.3)
Chloride: 103 mmol/L (ref 98–111)
Creatinine, Ser: 2.02 mg/dL — ABNORMAL HIGH (ref 0.61–1.24)
GFR, Estimated: 34 mL/min — ABNORMAL LOW (ref >60)
Glucose, Bld: 118 mg/dL — ABNORMAL HIGH (ref 70–99)
Potassium: 4.3 mmol/L (ref 3.5–5.1)
Sodium: 137 mmol/L (ref 135–145)
Total Bilirubin: 1 mg/dL (ref 0.0–1.2)
Total Protein: 7.9 g/dL (ref 6.5–8.1)

## 2023-03-14 LAB — CBC WITH DIFFERENTIAL/PLATELET
Abs Immature Granulocytes: 0.03 10*3/uL (ref 0.00–0.07)
Basophils Absolute: 0 10*3/uL (ref 0.0–0.1)
Basophils Relative: 0 %
Eosinophils Absolute: 0 10*3/uL (ref 0.0–0.5)
Eosinophils Relative: 1 %
HCT: 37.2 % — ABNORMAL LOW (ref 39.0–52.0)
Hemoglobin: 11.6 g/dL — ABNORMAL LOW (ref 13.0–17.0)
Immature Granulocytes: 1 %
Lymphocytes Relative: 28 %
Lymphs Abs: 1.4 10*3/uL (ref 0.7–4.0)
MCH: 29 pg (ref 26.0–34.0)
MCHC: 31.2 g/dL (ref 30.0–36.0)
MCV: 93 fL (ref 80.0–100.0)
Monocytes Absolute: 0.4 10*3/uL (ref 0.1–1.0)
Monocytes Relative: 8 %
Neutro Abs: 3.3 10*3/uL (ref 1.7–7.7)
Neutrophils Relative %: 62 %
Platelets: 160 10*3/uL (ref 150–400)
RBC: 4 MIL/uL — ABNORMAL LOW (ref 4.22–5.81)
RDW: 17.2 % — ABNORMAL HIGH (ref 11.5–15.5)
WBC: 5.2 10*3/uL (ref 4.0–10.5)
nRBC: 0 % (ref 0.0–0.2)

## 2023-03-14 MED ORDER — FERROUS FUMARATE 324 (106 FE) MG PO TABS
1.0000 | ORAL_TABLET | Freq: Every day | ORAL | Status: DC
  Administered 2023-03-15: 106 mg via ORAL
  Filled 2023-03-14: qty 1

## 2023-03-14 MED ORDER — ONDANSETRON HCL 4 MG/2ML IJ SOLN
4.0000 mg | Freq: Once | INTRAMUSCULAR | Status: AC
  Administered 2023-03-14: 4 mg via INTRAVENOUS
  Filled 2023-03-14: qty 2

## 2023-03-14 MED ORDER — ENOXAPARIN SODIUM 40 MG/0.4ML IJ SOSY: 40.0000 mg | PREFILLED_SYRINGE | INTRAMUSCULAR | Status: DC

## 2023-03-14 MED ORDER — APIXABAN 5 MG PO TABS
5.0000 mg | ORAL_TABLET | Freq: Two times a day (BID) | ORAL | Status: DC
  Administered 2023-03-15: 5 mg via ORAL
  Filled 2023-03-14: qty 1

## 2023-03-14 MED ORDER — DONEPEZIL HCL 5 MG PO TABS
5.0000 mg | ORAL_TABLET | Freq: Every morning | ORAL | Status: DC
  Administered 2023-03-15: 5 mg via ORAL
  Filled 2023-03-14 (×2): qty 1

## 2023-03-14 MED ORDER — PREGABALIN 50 MG PO CAPS
75.0000 mg | ORAL_CAPSULE | Freq: Every day | ORAL | Status: DC
  Administered 2023-03-15: 75 mg via ORAL
  Filled 2023-03-14: qty 1

## 2023-03-14 MED ORDER — MORPHINE SULFATE (PF) 2 MG/ML IV SOLN
2.0000 mg | INTRAVENOUS | Status: DC | PRN
  Administered 2023-03-18: 2 mg via INTRAVENOUS
  Filled 2023-03-14: qty 1

## 2023-03-14 MED ORDER — DEXTROSE-SODIUM CHLORIDE 5-0.9 % IV SOLN: INTRAVENOUS | Status: AC

## 2023-03-14 MED ORDER — SODIUM CHLORIDE 0.9 % IV BOLUS
500.0000 mL | Freq: Once | INTRAVENOUS | Status: AC
Start: 2023-03-14 — End: 2023-03-14
  Administered 2023-03-14: 500 mL via INTRAVENOUS

## 2023-03-14 MED ORDER — BUPROPION HCL 75 MG PO TABS
75.0000 mg | ORAL_TABLET | Freq: Every morning | ORAL | Status: DC
  Administered 2023-03-15: 75 mg via ORAL
  Filled 2023-03-14: qty 1

## 2023-03-14 MED ORDER — ONDANSETRON HCL 4 MG PO TABS: 4.0000 mg | ORAL_TABLET | Freq: Four times a day (QID) | ORAL | Status: DC | PRN

## 2023-03-14 MED ORDER — LORATADINE 10 MG PO TABS
10.0000 mg | ORAL_TABLET | Freq: Every day | ORAL | Status: DC
  Administered 2023-03-15: 10 mg via ORAL
  Filled 2023-03-14: qty 1

## 2023-03-14 MED ORDER — ONDANSETRON HCL 4 MG/2ML IJ SOLN
4.0000 mg | Freq: Four times a day (QID) | INTRAMUSCULAR | Status: DC | PRN
  Administered 2023-03-14 – 2023-03-22 (×5): 4 mg via INTRAVENOUS
  Filled 2023-03-14 (×5): qty 2

## 2023-03-14 MED ORDER — PANTOPRAZOLE SODIUM 40 MG PO TBEC
40.0000 mg | DELAYED_RELEASE_TABLET | Freq: Two times a day (BID) | ORAL | Status: DC
  Administered 2023-03-15 (×2): 40 mg via ORAL
  Filled 2023-03-14 (×2): qty 1

## 2023-03-14 MED ORDER — FINASTERIDE 5 MG PO TABS
5.0000 mg | ORAL_TABLET | Freq: Every day | ORAL | Status: DC
  Administered 2023-03-15: 5 mg via ORAL
  Filled 2023-03-14: qty 1

## 2023-03-14 MED ORDER — PANCRELIPASE (LIP-PROT-AMYL) 12000-38000 UNITS PO CPEP
12000.0000 [IU] | ORAL_CAPSULE | Freq: Three times a day (TID) | ORAL | Status: DC
  Administered 2023-03-15: 12000 [IU] via ORAL
  Filled 2023-03-14: qty 1

## 2023-03-14 MED ORDER — GLUCERNA SHAKE PO LIQD
237.0000 mL | Freq: Four times a day (QID) | ORAL | Status: DC
  Administered 2023-03-15: 237 mL via ORAL
  Filled 2023-03-14 (×3): qty 237

## 2023-03-14 MED ORDER — FAMOTIDINE 20 MG PO TABS
20.0000 mg | ORAL_TABLET | Freq: Two times a day (BID) | ORAL | Status: DC
  Administered 2023-03-15: 20 mg via ORAL
  Filled 2023-03-14: qty 1

## 2023-03-14 NOTE — H&P (Signed)
History and Physical    Patient: Brian Burns WGN:[id number] DOB: August 13, 1945 DOA: 03/14/2023 DOS: the patient was seen and examined on 03/14/2023 PCP: Pcp, No  Patient coming from: SNF  Chief Complaint:  Chief Complaint  Patient presents with   Nausea   HPI: Brian Burns is a 78 y.o. male with medical history significant of type 2 diabetes, essential hypertension BPH, vascular dementia, chronic kidney disease stage III, GERD, paroxysmal atrial fibrillation, coronary artery disease, seizure disorder, bilateral hearing loss, esophageal dysphagia among other things he is a resident of skilled nursing facility and has recently been having poor oral intake nausea and vomiting.  Patient has also exhibited some choking sensation when he ate.  He has been losing weight lately.  Patient will be seen by GI in the outpatient setting with plan to do EGD colonoscopy.  Recent CT scan has shown proctitis.  Workup in the process.  Patient also had poor baseline with failure to thrive.  Literally bedridden most of the time.  In the ER he was found to have evidence of dehydration.  He is still vomiting in the ER.  Creatinine has increased from his baseline of 1.6-2.2.  Also BUN elevated.  Being admitted with intractable nausea vomiting and AKI  Review of Systems: As mentioned in the history of present illness. All other systems reviewed and are negative. Past Medical History:  Diagnosis Date   AAA (abdominal aortic aneurysm) without rupture (HCC) 06/2014   no change in last exam from 2015   Achalasia    Anxiety    Arthritis    knees,lower back   Atrial fibrillation (HCC)    Atrial flutter with rapid ventricular response (HCC) 01/16/2015   Bilateral wrist pain 09/09/2014   Bladder outlet obstruction 01/20/2015   Bradycardia 08/27/2016   CAD (coronary artery disease)    pt denies   Carotid artery disease (HCC)    Chronic back pain    Chronic kidney disease (CKD), stage III (moderate) (HCC)     Constipation    Decreased visual acuity of left eye  12/02/2018   Depression    Dysphagia, oropharyngeal phase    Dyspnea    reports today no pregoression   Erectile dysfunction    GERD (gastroesophageal reflux disease)    Headache    hx of   History of CVA (cerebrovascular accident)    2009  &  2013   History of Multiple lacunar infarcts (HCC) 01/31/2015   MRI Brain 01/31/2015: Widespread old lacunar infarcts, particularly right Basal ganglia, pons, and right cerebellum   HLD (hyperlipidemia)    Hypertension    Idiopathic pancreatitis 12/2014   acute   Knee pain, bilateral 04/09/2011   Malnutrition of moderate degree 01/25/2015   Memory impairment    mild dementia per PCP progress note    Morbid obesity (HCC) 12/03/2018   Other abnormalities of gait and mobility 12/03/2018   PVD (peripheral vascular disease) with claudication (HCC)    Type 2 diabetes mellitus (HCC)    Urethral stricture 02/16/2013   Urge incontinence 12/08/2018   Vascular dementia [facility])    Past Surgical History:  Procedure Laterality Date   back injections     [facility]   BALLOON DILATION N/A 12/16/2018   Procedure: URETHRAL BALLOON DILATION;  Surgeon: Crist Fat, MD;  Location: [facility];  Service: Urology;  Laterality: N/A;   BIOPSY  05/22/2020   Procedure: BIOPSY;  Surgeon: [name], MD;  [facility]: [facility];  Service: Gastroenterology;;  CARDIOVASCULAR STRESS TEST  08-09-2009   mild global hypokinesis,  no ischemia or scar/  ef 41%   CATARACT EXTRACTION W/ INTRAOCULAR LENS  IMPLANT, BILATERAL  april and may 2014   CYSTOSCOPY N/A 01/17/2017   Procedure: CYSTOSCOPY FLEXIBLE;  Surgeon: Crist Fat, MD;  [facility]: [facility];  Service: Urology;  Laterality: N/A;   CYSTOSCOPY WITH RETROGRADE URETHROGRAM N/A 11/24/2014   Procedure: CYSTOSCOPY WITH RETROGRADE URETHROGRAM;  Surgeon: Crist Fat, MD;  [facility];  Service: Urology;  Laterality:  N/A;   CYSTOSCOPY WITH RETROGRADE URETHROGRAM N/A 12/16/2018   Procedure: CYSTOSCOPY WITH RETROGRADE URETHROGRAM;  Surgeon: Crist Fat, MD;  [facility];  Service: Urology;  Laterality: N/A;   CYSTOSCOPY WITH RETROGRADE URETHROGRAM N/A 12/16/2019   Procedure: CYSTOSCOPY WITH RETROGRADE, Direct Vision Internal Urethrotomy;  Surgeon: Crist Fat, MD;  [facility];  Service: Urology;  Laterality: N/A;   CYSTOSCOPY WITH URETHRAL DILATATION N/A 11/24/2014   Procedure: CYSTOSCOPY WITH URETHRAL DILATATION;  Surgeon: Crist Fat, MD;  [facility];  Service: Urology;  Laterality: N/A;  BALLOON DILATION         ESOPHAGEAL DILATION  05/22/2020   Procedure: ESOPHAGEAL DILATION;  Surgeon: [name], MD;  [facility];  Service: Gastroenterology;;   ESOPHAGOGASTRODUODENOSCOPY N/A 05/15/2014   Procedure: ESOPHAGOGASTRODUODENOSCOPY (EGD);  Surgeon: Carman Ching, MD;  Location: [facility];  Service: Endoscopy;  Laterality: N/A;   ESOPHAGOGASTRODUODENOSCOPY Left 02/02/2015   Procedure: ESOPHAGOGASTRODUODENOSCOPY (EGD);  Surgeon: Dorena Cookey, MD;  Location: [facility] ENDOSCOPY;  Service: Endoscopy;  Laterality: Left;   ESOPHAGOGASTRODUODENOSCOPY Left 03/16/2015   Procedure: ESOPHAGOGASTRODUODENOSCOPY (EGD);  Surgeon: Dorena Cookey, MD;  Location: [facility];  Service: Endoscopy;  Laterality: Left;   ESOPHAGOGASTRODUODENOSCOPY (EGD) WITH PROPOFOL N/A 05/22/2020   Procedure: ESOPHAGOGASTRODUODENOSCOPY (EGD) WITH PROPOFOL;  Surgeon: [name], MD;  [facility];  Service: Gastroenterology;  Laterality: N/A;   FOREIGN BODY REMOVAL  05/22/2020   Procedure: FOREIGN BODY REMOVAL;  Surgeon: [name], MD;  [facility];  Service: Gastroenterology;;   TRANSTHORACIC ECHOCARDIOGRAM  01-07-2012   mild to moderate dilated LV,  ef 45-50%,  mild basal hypokinesis,  grade I diastolic  dysfunction/  trivial AR/  mild MR    URETHROPLASTY N/A 01/17/2017   Procedure: URETHEROPLASTY BUCCAL GRAFT AND BUCCAL MUCOSA GRAFT HARVEST;  Surgeon: Crist Fat, MD;  [facility];  Service: Urology;  Laterality: N/A;   Social History:  reports that he quit smoking about 24 years ago. His smoking use included cigarettes. He started smoking about 64 years ago. He has a 20 pack-year smoking history. He has never used smokeless tobacco. He reports that he does not drink alcohol and does not use drugs.  Allergies  Allergen Reactions   Gabapentin Other (See Comments)    Sedation at 300mg  TID    Family History  Problem Relation Age of Onset   Lung disease Brother    Kidney disease Brother    Heart disease Brother        died at 45   Heart disease Mother        died at 65   Kidney disease Mother    Lung disease Mother     Prior to Admission medications   Medication Sig Start Date End Date Taking? Authorizing Provider  apixaban (ELIQUIS) 5 MG TABS tablet Take 5 mg by mouth 2 (two) times daily.    [provider]  Benzonatate (TESSALON PERLES PO) Take 1  tablet by mouth daily.    [provider]  buPROPion (WELLBUTRIN) 75 MG tablet Take 75 mg by mouth every morning. 11/01/22   [provider]  Cholecalciferol (VITAMIN D) 50 MCG (2000 UT) tablet Take 2,000 Units by mouth daily.    [provider]  famotidine (PEPCID) 20 MG tablet Take 20 mg by mouth 2 (two) times daily.    [provider]  Ferrous Fumarate (HEMOCYTE - 106 MG FE) 324 (106 Fe) MG TABS tablet Take 106 mg of iron by mouth daily.    [provider]  finasteride (PROSCAR) 5 MG tablet Take 5 mg by mouth at bedtime.    [provider]  lipase/protease/amylase (CREON) 12000-38000 units CPEP capsule Take 12,000 Units by mouth 3 (three) times daily before meals.    [provider]  melatonin 3 MG TABS tablet Take 3 mg by mouth at bedtime.    [provider]  Multiple Vitamin  (MULTIVITAMIN) capsule Take 1 capsule by mouth daily.    [provider]  ondansetron (ZOFRAN) 4 MG tablet Take 4 mg by mouth every 6 (six) hours as needed for nausea or vomiting.    [provider]  pantoprazole (PROTONIX) 40 MG tablet Take 40 mg by mouth 2 (two) times daily.    [provider]  polyethylene glycol (MIRALAX / GLYCOLAX) 17 g packet Take 17 g by mouth daily as needed. Patient taking differently: Take 17 g by mouth daily as needed for mild constipation. 05/23/20   Valetta Close, MD  potassium chloride (KLOR-CON) 10 MEQ tablet Take 1 tablet (10 mEq total) by mouth daily. 04/05/20   Caccavale, Sophia, PA-C  pregabalin (LYRICA) 75 MG capsule Take 75 mg by mouth 2 (two) times daily. 09/04/20   [provider]  senna (SENOKOT) 8.6 MG tablet Take 2 tablets by mouth 2 (two) times daily.    [provider]  sodium phosphate (FLEET) ENEM Place 1 enema rectally daily as needed for severe constipation.    [provider]  traMADol (ULTRAM) 50 MG tablet Take 1 tablet (50 mg total) by mouth 3 (three) times daily as needed for moderate pain. 01/31/20   Genice Rouge, MD    Physical Exam: Vitals:   03/14/23 1535 03/14/23 1537 03/14/23 1542 03/14/23 1832  BP:  119/86  95/67  Pulse:  70  91  Resp:  18  18  Temp:  (!) 97.5 F (36.4 C)    TempSrc:  Oral    SpO2: 96% 99%  97%  Weight:   69.2 kg   Height:   6' (1.829 m)    Constitutional: Chronically ill looking, no distress  calm, comfortable Eyes: PERRL, lids and conjunctivae normal ENMT: Mucous membranes are dry posterior pharynx clear of any exudate or lesions.Normal dentition.  Neck: normal, supple, no masses, no thyromegaly Respiratory: clear to auscultation bilaterally, no wheezing, no crackles. Normal respiratory effort. No accessory muscle use.  Cardiovascular: Regular rate and rhythm, no murmurs / rubs / gallops. No extremity edema. 2+ pedal pulses. No carotid bruits.  Abdomen: no  tenderness, no masses palpated. No hepatosplenomegaly. Bowel sounds positive.  Musculoskeletal: Good range of motion, no joint swelling or tenderness, Skin: no rashes, lesions, ulcers. No induration Neurologic: CN 2-12 grossly intact. Sensation intact, DTR normal. Strength 5/5 in all 4.  Psychiatric: Disoriented in person place and time  Data Reviewed:  Temperature 97.5, blood pressure 95/67, chemistry showed glucose of 118, BUN 31 creatinine 2.02 albumin 3.1.  Hemoglobin is 11.6.  Abdominal x-ray showed no acute findings in the chest and abdomen there is stool throughout the colon consistent with possible constipation  Assessment and Plan:  #1 intractable nausea with vomiting: Patient appears to have some type of obstruction probably from his prostatitis or massive stool burden.  Will admit the patient.  Control nausea with vomiting if possible.  Consider enema if laxatives do not work.  Because of his nausea vomiting.  Constipation.  Once that resolves patient may get better.  #2 AKI: Appears to be prerenal in nature.  Aggressively hydrate and monitor renal function.  #3 type 2 diabetes: Has not eating much.  I will still use glucose 1 enterally.  Sliding scale insulin.  #4 essential hypertension: Blood pressure is low.  Hold blood pressure medications.  #5 vascular dementia: History largely obtained from daughter.  Patient is stable however no agitation.  #6 unintentional weight loss: Workup by GI.  #7 esophageal dysphagia: Continue outpatient workup by GI.  Will consult GI in house.  #8 coronary artery disease: No acute decompensation.  Continue to follow  #9 seizure disorder: Confirm and resume home regimen as soon as practicable.  #10 adult failure to thrive: PT and OT.  Patient lives in a nursing facility.     Advance Care Planning:   Code Status: Limited: Do not attempt resuscitation (DNR) -DNR-LIMITED -Do Not Intubate/DNI    Consults: Will consult GI  Family  Communication: Daughter at bedside  Severity of Illness: The appropriate patient status for this patient is INPATIENT. Inpatient status is judged to be reasonable and necessary in order to provide the required intensity of service to ensure the patient's safety. The patient's presenting symptoms, physical exam findings, and initial radiographic and laboratory data in the context of their chronic comorbidities is felt to place them at high risk for further clinical deterioration. Furthermore, it is not anticipated that the patient will be medically stable for discharge from the hospital within 2 midnights of admission.   * I certify that at the point of admission it is my clinical judgment that the patient will require inpatient hospital care spanning beyond 2 midnights from the point of admission due to high intensity of service, high risk for further deterioration and high frequency of surveillance required.*  AuthorLonia Blood, MD 03/14/2023 6:47 PM  For on call review [email or web address].

## 2023-03-14 NOTE — ED Notes (Signed)
ED TO INPATIENT HANDOFF REPORT  ED Nurse Name and Phone #: Wynema Birch Name/Age/Gender Brian Burns 78 y.o. male Room/Bed: WA15/WA15  Code Status   Code Status: Prior  Home/SNF/Other Nursing Home Patient oriented to: self and place Is this baseline? Yes   Triage Complete: Triage complete  Chief Complaint AKI (acute kidney injury) [facility]) [N17.9]  Triage Note Patient brought in via ems. He has had recent weight loss. However today patient has issues holding down anything per ems.     Allergies Allergies  Allergen Reactions   Gabapentin Other (See Comments)    Sedation at 300mg  TID    Level of Care/Admitting Diagnosis ED Disposition     ED Disposition  Admit   Condition  --   Comment  H[facility]: [facility] [[id number]]  Level of Care: Med-Surg [16]  May admit patient to [facility] or [facility] if equivalent level of care is available:: Yes  Covid Evaluation: Asymptomatic - no recent exposure (last 10 days) testing not required  Diagnosis: AKI (acute kidney injury) [facility]) [[id number]]  Admitting Physician: Rometta Emery [[id number]]  Attending Physician: Rometta Emery [[id number]]  Certification:: I certify this patient will need inpatient services for at least 2 midnights  Expected Medical Readiness: 03/17/2023          B Medical/Surgery History Past Medical History:  Diagnosis Date   AAA (abdominal aortic aneurysm) without rupture (HCC) 06/2014   no change in last exam from 2015   Achalasia    Anxiety    Arthritis    knees,lower back   Atrial fibrillation (HCC)    Atrial flutter with rapid ventricular response (HCC) 01/16/2015   Bilateral wrist pain 09/09/2014   Bladder outlet obstruction 01/20/2015   Bradycardia 08/27/2016   CAD (coronary artery disease)    pt denies   Carotid artery disease (HCC)    Chronic back pain    Chronic kidney disease (CKD), stage III (moderate) (HCC)    Constipation    Decreased visual acuity of left  eye  12/02/2018   Depression    Dysphagia, oropharyngeal phase    Dyspnea    reports today no pregoression   Erectile dysfunction    GERD (gastroesophageal reflux disease)    Headache    hx of   History of CVA (cerebrovascular accident)    2009  &  2013   History of Multiple lacunar infarcts (HCC) 01/31/2015   MRI Brain 01/31/2015: Widespread old lacunar infarcts, particularly right Basal ganglia, pons, and right cerebellum   HLD (hyperlipidemia)    Hypertension    Idiopathic pancreatitis 12/2014   acute   Knee pain, bilateral 04/09/2011   Malnutrition of moderate degree 01/25/2015   Memory impairment    mild dementia per PCP progress note    Morbid obesity (HCC) 12/03/2018   Other abnormalities of gait and mobility 12/03/2018   PVD (peripheral vascular disease) with claudication (HCC)    Type 2 diabetes mellitus (HCC)    Urethral stricture 02/16/2013   Urge incontinence 12/08/2018   Vascular dementia [facility])    Past Surgical History:  Procedure Laterality Date   back injections     [facility]   BALLOON DILATION N/A 12/16/2018   Procedure: URETHRAL BALLOON DILATION;  Surgeon: Crist Fat, MD;  [facility];  Service: Urology;  Laterality: N/A;   BIOPSY  05/22/2020   Procedure: BIOPSY;  Surgeon: [name], MD;  [facility];  Service: Gastroenterology;;   CARDIOVASCULAR  STRESS TEST  08-09-2009   mild global hypokinesis,  no ischemia or scar/  ef 41%   CATARACT EXTRACTION W/ INTRAOCULAR LENS  IMPLANT, BILATERAL  april and may 2014   CYSTOSCOPY N/A 01/17/2017   Procedure: CYSTOSCOPY FLEXIBLE;  Surgeon: Crist Fat, MD;  [facility];  Service: Urology;  Laterality: N/A;   CYSTOSCOPY WITH RETROGRADE URETHROGRAM N/A 11/24/2014   Procedure: CYSTOSCOPY WITH RETROGRADE URETHROGRAM;  Surgeon: Crist Fat, MD;  Location: [facility];  Service: Urology;  Laterality: N/A;   CYSTOSCOPY WITH RETROGRADE URETHROGRAM  N/A 12/16/2018   Procedure: CYSTOSCOPY WITH RETROGRADE URETHROGRAM;  Surgeon: Crist Fat, MD;  [facility];  Service: Urology;  Laterality: N/A;   CYSTOSCOPY WITH RETROGRADE URETHROGRAM N/A 12/16/2019   Procedure: CYSTOSCOPY WITH RETROGRADE, Direct Vision Internal Urethrotomy;  Surgeon: Crist Fat, MD;  [facility];  Service: Urology;  Laterality: N/A;   CYSTOSCOPY WITH URETHRAL DILATATION N/A 11/24/2014   Procedure: CYSTOSCOPY WITH URETHRAL DILATATION;  Surgeon: Crist Fat, MD;  [facility];  Service: Urology;  Laterality: N/A;  BALLOON DILATION         ESOPHAGEAL DILATION  05/22/2020   Procedure: ESOPHAGEAL DILATION;  Surgeon: [name], MD;  [facility];  Service: Gastroenterology;;   ESOPHAGOGASTRODUODENOSCOPY N/A 05/15/2014   Procedure: ESOPHAGOGASTRODUODENOSCOPY (EGD);  Surgeon: Carman Ching, MD;  Location: [facility]COPY;  Service: Endoscopy;  Laterality: N/A;   ESOPHAGOGASTRODUODENOSCOPY Left 02/02/2015   Procedure: ESOPHAGOGASTRODUODENOSCOPY (EGD);  Surgeon: Dorena Cookey, MD;  Location: [facility]COPY;  Service: Endoscopy;  Laterality: Left;   ESOPHAGOGASTRODUODENOSCOPY Left 03/16/2015   Procedure: ESOPHAGOGASTRODUODENOSCOPY (EGD);  Surgeon: Dorena Cookey, MD;  Location: [facility];  Service: Endoscopy;  Laterality: Left;   ESOPHAGOGASTRODUODENOSCOPY (EGD) WITH PROPOFOL N/A 05/22/2020   Procedure: ESOPHAGOGASTRODUODENOSCOPY (EGD) WITH PROPOFOL;  Surgeon: [name], MD;  [facility];  Service: Gastroenterology;  Laterality: N/A;   FOREIGN BODY REMOVAL  05/22/2020   Procedure: FOREIGN BODY REMOVAL;  Surgeon: [name], MD;  [facility];  Service: Gastroenterology;;   TRANSTHORACIC ECHOCARDIOGRAM  01-07-2012   mild to moderate dilated LV,  ef 45-50%,  mild basal hypokinesis,  grade I diastolic  dysfunction/  trivial AR/  mild MR   URETHROPLASTY N/A 01/17/2017   Procedure:  URETHEROPLASTY BUCCAL GRAFT AND BUCCAL MUCOSA GRAFT HARVEST;  Surgeon: Crist Fat, MD;  [facility];  Service: Urology;  Laterality: N/A;     A IV Location/Drains/Wounds Patient Lines/Drains/Airways Status     Active Line/Drains/Airways     Name Placement date Placement time Site Days   Peripheral IV 03/14/23 20 G Left Antecubital 03/14/23  1624  Antecubital  less than 1            Intake/Output Last 24 hours  Intake/Output Summary (Last 24 hours) at 03/14/2023 1841 Last data filed at 03/14/2023 1827 Gross per 24 hour  Intake 500 ml  Output --  Net 500 ml    Labs/Imaging Results for orders placed or performed during the hospital encounter of 03/14/23 (from the past 48 hours)  Comprehensive metabolic panel     Status: Abnormal   Collection Time: 03/14/23  4:36 PM  Result Value Ref Range   Sodium 137 135 - 145 mmol/L   Potassium 4.3 3.5 - 5.1 mmol/L   Chloride 103 98 - 111 mmol/L   CO2 24 22 - 32 mmol/L   Glucose, Bld 118 (H) 70 - 99 mg/dL    Comment: Glucose reference range  applies only to samples taken after fasting for at least 8 hours.   BUN 31 (H) 8 - 23 mg/dL   Creatinine, Ser 4.09 (H) 0.61 - 1.24 mg/dL   Calcium 8.9 8.9 - 81.1 mg/dL   Total Protein 7.9 6.5 - 8.1 g/dL   Albumin 3.1 (L) 3.5 - 5.0 g/dL   AST 27 15 - 41 U/L   ALT 19 0 - 44 U/L   Alkaline Phosphatase 89 38 - 126 U/L   Total Bilirubin 1.0 0.0 - 1.2 mg/dL   GFR, Estimated 34 (L) >60 mL/min    Comment: (NOTE) Calculated using the CKD-EPI Creatinine Equation (2021)    Anion gap 10 5 - 15    Comment: Performed at [facility], [address], [place], [place] [address]  CBC with Differential     Status: Abnormal   Collection Time: 03/14/23  4:36 PM  Result Value Ref Range   WBC 5.2 4.0 - 10.5 K/uL   RBC 4.00 (L) 4.22 - 5.81 MIL/uL   Hemoglobin 11.6 (L) 13.0 - 17.0 g/dL   HCT 29.5 (L) 62.1 - 30.8 %   MCV 93.0 80.0 - 100.0 fL   MCH 29.0 26.0 - 34.0 pg   MCHC 31.2  30.0 - 36.0 g/dL   RDW 65.7 (H) 84.6 - 96.2 %   Platelets 160 150 - 400 K/uL   nRBC 0.0 0.0 - 0.2 %   Neutrophils Relative % 62 %   Neutro Abs 3.3 1.7 - 7.7 K/uL   Lymphocytes Relative 28 %   Lymphs Abs 1.4 0.7 - 4.0 K/uL   Monocytes Relative 8 %   Monocytes Absolute 0.4 0.1 - 1.0 K/uL   Eosinophils Relative 1 %   Eosinophils Absolute 0.0 0.0 - 0.5 K/uL   Basophils Relative 0 %   Basophils Absolute 0.0 0.0 - 0.1 K/uL   Immature Granulocytes 1 %   Abs Immature Granulocytes 0.03 0.00 - 0.07 K/uL    Comment: Performed at [facility], [address], [place], [place] [address]   DG Abdomen Acute W/Chest Result Date: 03/14/2023 CLINICAL DATA:  choking, vomiting EXAM: DG ABDOMEN ACUTE WITH 1 VIEW CHEST COMPARISON:  Chest x-ray 06/26/2021 FINDINGS: Similar-appearing widened mediastinum consistent with known underlying esophageal dilatation in tortuosity. Other the heart and mediastinal contours are unchanged. No focal consolidation. No pulmonary edema. No pleural effusion. No pneumothorax. Stool throughout the colon. There is no evidence of dilated bowel loops or free intraperitoneal air. No radiopaque calculi or other significant radiographic abnormality is seen. Atherosclerotic plaque. No acute osseous abnormality. IMPRESSION: 1. No acute cardiopulmonary disease. 2. Nonobstructive bowel gas pattern. 3. Stool throughout the colon-correlate for constipation. 4. Similar-appearing widened mediastinum consistent with known underlying esophageal dilatation in tortuosity. Electronically Signed   By: Tish Frederickson M.D.   On: 03/14/2023 18:13    Pending Labs Unresulted Labs (From admission, onward)    None       Vitals/Pain Today's Vitals   03/14/23 1537 03/14/23 1540 03/14/23 1542 03/14/23 1832  BP: 119/86   95/67  Pulse: 70   91  Resp: 18   18  Temp: (!) 97.5 F (36.4 C)     TempSrc: Oral     SpO2: 99%   97%  Weight:   69.2 kg   Height:   6' (1.829 m)   PainSc:   0-No pain      Isolation Precautions No active isolations  Medications Medications  sodium chloride 0.9 %  bolus 500 mL (0 mLs Intravenous Stopped 03/14/23 1827)  ondansetron (ZOFRAN) injection 4 mg (4 mg Intravenous Given 03/14/23 1629)    Mobility non-ambulatory     Focused Assessments  Decreased appetite and intake     R Recommendations: See Admitting Provider Note  Report given to:   Additional Notes:  MOST FORM

## 2023-03-14 NOTE — ED Notes (Signed)
Report given to floor nurse

## 2023-03-14 NOTE — ED Triage Notes (Signed)
Patient brought in via ems. He has had recent weight loss. However today patient has issues holding down anything per ems.

## 2023-03-14 NOTE — ED Provider Notes (Signed)
[facility] EMERGENCY DEPARTMENT AT [facility] Provider Note   CSN: [id number] Arrival date & time: 03/14/23  1527     History  Chief Complaint  Patient presents with   Nausea    Brian Burns is a 78 y.o. male.  HPI Patient with reported choking episode and vomiting.  Reportedly has a history of some dysphagia.  Sent in by nursing home.  Has been losing weight chronically.  Has seen GI with plans for scoping.  Had CT scan that showed a proctitis recently.  Patient is chronically bedbound and has somewhat limited verbal at baseline.    Home Medications Prior to Admission medications   Medication Sig Start Date End Date Taking? Authorizing Provider  apixaban (ELIQUIS) 5 MG TABS tablet Take 5 mg by mouth 2 (two) times daily.    [provider]  Benzonatate (TESSALON PERLES PO) Take 1 tablet by mouth daily.    [provider]  buPROPion (WELLBUTRIN) 75 MG tablet Take 75 mg by mouth every morning. 11/01/22   [provider]  Cholecalciferol (VITAMIN D) 50 MCG (2000 UT) tablet Take 2,000 Units by mouth daily.    [provider]  famotidine (PEPCID) 20 MG tablet Take 20 mg by mouth 2 (two) times daily.    [provider]  Ferrous Fumarate (HEMOCYTE - 106 MG FE) 324 (106 Fe) MG TABS tablet Take 106 mg of iron by mouth daily.    [provider]  finasteride (PROSCAR) 5 MG tablet Take 5 mg by mouth at bedtime.    [provider]  lipase/protease/amylase (CREON) 12000-38000 units CPEP capsule Take 12,000 Units by mouth 3 (three) times daily before meals.    [provider]  melatonin 3 MG TABS tablet Take 3 mg by mouth at bedtime.    [provider]  Multiple Vitamin (MULTIVITAMIN) capsule Take 1 capsule by mouth daily.    [provider]  ondansetron (ZOFRAN) 4 MG tablet Take 4 mg by mouth every 6 (six) hours as needed for nausea or vomiting.    [provider]  pantoprazole  (PROTONIX) 40 MG tablet Take 40 mg by mouth 2 (two) times daily.    [provider]  polyethylene glycol (MIRALAX / GLYCOLAX) 17 g packet Take 17 g by mouth daily as needed. Patient taking differently: Take 17 g by mouth daily as needed for mild constipation. 05/23/20   Valetta Close, MD  potassium chloride (KLOR-CON) 10 MEQ tablet Take 1 tablet (10 mEq total) by mouth daily. 04/05/20   Caccavale, Sophia, PA-C  pregabalin (LYRICA) 75 MG capsule Take 75 mg by mouth 2 (two) times daily. 09/04/20   [provider]  senna (SENOKOT) 8.6 MG tablet Take 2 tablets by mouth 2 (two) times daily.    [provider]  sodium phosphate (FLEET) ENEM Place 1 enema rectally daily as needed for severe constipation.    [provider]  traMADol (ULTRAM) 50 MG tablet Take 1 tablet (50 mg total) by mouth 3 (three) times daily as needed for moderate pain. 01/31/20   Lovorn, Aundra Millet, MD      Allergies    Gabapentin    Review of Systems   Review of Systems  Physical Exam Updated Vital Signs BP 119/86   Pulse 70   Temp (!) 97.5 F (36.4 C) (Oral)   Resp 18   Ht 6' (1.829 m)   Wt 69.2 kg   SpO2 99%   BMI 20.70 kg/m  Physical Exam Vitals and nursing note reviewed.  Cardiovascular:     Rate and Rhythm: Regular rhythm.  Pulmonary:     Breath sounds: No wheezing.  Abdominal:     Tenderness: There is no abdominal tenderness.  Musculoskeletal:     Cervical back: Neck supple.  Skin:    General: Skin is warm.  Neurological:     Mental Status: He is alert. Mental status is at baseline.     ED Results / Procedures / Treatments   Labs (all labs ordered are listed, but only abnormal results are displayed) Labs Reviewed  COMPREHENSIVE METABOLIC PANEL - Abnormal; Notable for the following components:      Result Value   Glucose, Bld 118 (*)    BUN 31 (*)    Creatinine, Ser 2.02 (*)    Albumin 3.1 (*)    GFR, Estimated 34 (*)    All other components within normal limits   CBC WITH DIFFERENTIAL/PLATELET - Abnormal; Notable for the following components:   RBC 4.00 (*)    Hemoglobin 11.6 (*)    HCT 37.2 (*)    RDW 17.2 (*)    All other components within normal limits    EKG None  Radiology DG Abdomen Acute W/Chest Result Date: 03/14/2023 CLINICAL DATA:  choking, vomiting EXAM: DG ABDOMEN ACUTE WITH 1 VIEW CHEST COMPARISON:  Chest x-ray 06/26/2021 FINDINGS: Similar-appearing widened mediastinum consistent with known underlying esophageal dilatation in tortuosity. Other the heart and mediastinal contours are unchanged. No focal consolidation. No pulmonary edema. No pleural effusion. No pneumothorax. Stool throughout the colon. There is no evidence of dilated bowel loops or free intraperitoneal air. No radiopaque calculi or other significant radiographic abnormality is seen. Atherosclerotic plaque. No acute osseous abnormality. IMPRESSION: 1. No acute cardiopulmonary disease. 2. Nonobstructive bowel gas pattern. 3. Stool throughout the colon-correlate for constipation. 4. Similar-appearing widened mediastinum consistent with known underlying esophageal dilatation in tortuosity. Electronically Signed   By: Tish Frederickson M.D.   On: 03/14/2023 18:13    Procedures Procedures    Medications Ordered in ED Medications  sodium chloride 0.9 % bolus 500 mL (500 mLs Intravenous New Bag/Given 03/14/23 1628)  ondansetron (ZOFRAN) injection 4 mg (4 mg Intravenous Given 03/14/23 1629)    ED Course/ Medical Decision Making/ A&P                                 Medical Decision Making Amount and/or Complexity of Data Reviewed Labs: ordered. Radiology: ordered.  Risk Prescription drug management.   Patient with reported coughing/choking episode.  Has had decreased oral intake recently.  Has had reported some choking episodes.  Reviewed previous GI note.  Is on anticoagulation.  Will get basic blood work and acute abdominal series.  Reviewed previous GI  note.  Creatinine now more increased and up to 2..  4 months ago it was 1.6 now up to 2 although BUN is up to 30.  Patient's daughter is now here and provide more history.  Over the last couple weeks has had decreased oral intake.  Seen by GI and plan for scope.  However now not able to keep anything down.  Has vomited since here.  Has an emesis bag with liquid in it.  I think with increasing creatinine and inability to tolerate oral will benefit from mission to the hospital.  Reviewed CT scan from 3 months ago.  X-ray done today was reassuring except for  some constipation.  Will discuss with hospitalist for admission.        Final Clinical Impression(s) / ED Diagnoses Final diagnoses:  Dehydration    Rx / DC Orders ED Discharge Orders     None         Benjiman Core, MD 03/14/23 Rickey Primus

## 2023-03-15 ENCOUNTER — Encounter (HOSPITAL_COMMUNITY): Payer: Self-pay | Admitting: Internal Medicine

## 2023-03-15 DIAGNOSIS — N179 Acute kidney failure, unspecified: Secondary | ICD-10-CM | POA: Diagnosis not present

## 2023-03-15 LAB — COMPREHENSIVE METABOLIC PANEL
ALT: 17 U/L (ref 0–44)
AST: 25 U/L (ref 15–41)
Albumin: 2.7 g/dL — ABNORMAL LOW (ref 3.5–5.0)
Alkaline Phosphatase: 76 U/L (ref 38–126)
Anion gap: 8 (ref 5–15)
BUN: 25 mg/dL — ABNORMAL HIGH (ref 8–23)
CO2: 25 mmol/L (ref 22–32)
Calcium: 8.3 mg/dL — ABNORMAL LOW (ref 8.9–10.3)
Chloride: 106 mmol/L (ref 98–111)
Creatinine, Ser: 1.61 mg/dL — ABNORMAL HIGH (ref 0.61–1.24)
GFR, Estimated: 44 mL/min — ABNORMAL LOW (ref >60)
Glucose, Bld: 109 mg/dL — ABNORMAL HIGH (ref 70–99)
Potassium: 3.6 mmol/L (ref 3.5–5.1)
Sodium: 139 mmol/L (ref 135–145)
Total Bilirubin: 0.7 mg/dL (ref 0.0–1.2)
Total Protein: 6.8 g/dL (ref 6.5–8.1)

## 2023-03-15 LAB — CBC
HCT: 33.2 % — ABNORMAL LOW (ref 39.0–52.0)
Hemoglobin: 10.2 g/dL — ABNORMAL LOW (ref 13.0–17.0)
MCH: 28.7 pg (ref 26.0–34.0)
MCHC: 30.7 g/dL (ref 30.0–36.0)
MCV: 93.5 fL (ref 80.0–100.0)
Platelets: 133 10*3/uL — ABNORMAL LOW (ref 150–400)
RBC: 3.55 MIL/uL — ABNORMAL LOW (ref 4.22–5.81)
RDW: 17.4 % — ABNORMAL HIGH (ref 11.5–15.5)
WBC: 5.3 10*3/uL (ref 4.0–10.5)
nRBC: 0 % (ref 0.0–0.2)

## 2023-03-15 MED ORDER — BISACODYL 10 MG RE SUPP: 10.0000 mg | Freq: Every day | RECTAL | Status: DC | PRN

## 2023-03-15 MED ORDER — FLEET ENEMA RE ENEM: 1.0000 | ENEMA | Freq: Every day | RECTAL | Status: DC | PRN

## 2023-03-15 MED ORDER — PANTOPRAZOLE SODIUM 40 MG IV SOLR
40.0000 mg | INTRAVENOUS | Status: DC
  Administered 2023-03-15: 40 mg via INTRAVENOUS
  Filled 2023-03-15: qty 10

## 2023-03-15 MED ORDER — HEPARIN SODIUM (PORCINE) 5000 UNIT/ML IJ SOLN
5000.0000 [IU] | Freq: Three times a day (TID) | INTRAMUSCULAR | Status: DC
  Administered 2023-03-15 – 2023-03-17 (×6): 5000 [IU] via SUBCUTANEOUS
  Filled 2023-03-15 (×6): qty 1

## 2023-03-15 NOTE — Hospital Course (Addendum)
Hospital course / significant events:   HPI: Brian Burns is a 78 y.o. male with medical history significant of type 2 diabetes, essential hypertension BPH, vascular dementia, chronic kidney disease stage III, GERD, paroxysmal atrial fibrillation, coronary artery disease, seizure disorder, bilateral hearing loss, esophageal dysphagia among other things he is a resident of skilled nursing facility and has recently been having poor oral intake nausea and vomiting.  Patient has also exhibited some choking sensation when he ate.  He has been losing weight lately.  Patient will be seen by GI in the outpatient setting with plan to do EGD colonoscopy.  Recent CT scan has shown proctitis.  Workup in the process.  Patient also had poor baseline with failure to thrive.  Literally bedridden most of the time.    02/14: In the ER he was found to have evidence of dehydration.  He was still vomiting in the ER.  Creatinine increased from his baseline of 1.6--> 2.2.  Also BUN elevated.  Was admitted with intractable nausea vomiting and AKI  02/15: Cr back at baseline. Still nausea w/ dry heaves but pt is requesting Pepsi, has no other complaints or concerns but is not oriented. See belwo for discussion w/ daughter - will get CT c/a/p and ask GI to see him pending this      Consultants:  Gastroenterolofy   Procedures/Surgeries: none      ASSESSMENT & PLAN:   #1 intractable nausea with vomiting Ongoing weight loss and apparent dysphagia Hx esophageal dilatation / tortuous esophagus   No significant pain to concern for obstruction Question constipation component vs esophageal dysmotility/dysphagia  Bowel regimen - po if able to keep down po pills/meds otherwise will need to attempt enema  SLP eval prior to po meds Since renal fxn is improved will get CT ches/abd/pelvis to eval GI tract as well as lungs for aspiration evidence   Advanced Care Planning: Detailed discussion over the phone today with  daughter Brian Burns. She confirms above history that over the past month or so, patient has had more difficulty swallowing and has been losing weight.  She notes that when she/other family members go over to help him eat, he is not keeping anything down.  She notes that he is often confused but has definitely seemed more disoriented lately. We discussed potential etiology for his nausea/vomiting or apparent dysphagia.   Problems which we could intervene on would include constipation, SBO, esophageal stricture, other similar issues.  Will ask GI to evaluate after we get CT scan as noted above. Problems which would be more difficult to manage, and which I suspect even if it is not so much initially now it almost certainly will be eventually, would be functional swallowing problems possibly related to previous dilatations or other esophageal dysfunction, or more likely advancement of dementia to the point where swallowing function is impacted. If we are not able to improve his symptoms/intervene, I would not recommend a feeding tube for this individual given his dementia.  Daughter is understanding and agreeable about this, we discussed that patients with dementia are more likely to try to pull the tubes, the procedure to do the insertion is not without risk, they are still at significant risk of aspiration with feeding tube.  Daughter agrees that a feeding tube would not be part of the plan for him and would be agreeable to palliative/hospice if he is not improving/if we do not end up finding a diagnosis which would be amenable to reasonable interventions  AKI: improved  Appears to be prerenal in nature.   hydrate  monitor renal function.   type 2 diabetes: Has not eating much.   Monitor Glc  May need D5 fluids if remaining npo     essential hypertension:  Blood pressure is low.  Hold blood pressure medications.   vascular dementia:  History largely obtained from daughter.  Patient is stable  however no agitation.   coronary artery disease: No acute decompensation.  Continue to follow   seizure disorder: Confirm and resume home regimen as soon as racticable.   adult failure to thrive: PT and OT.  Patient lives in a nursing facility.    DVT prophylaxis: heparin IV fluids: d5 NaCl continuous IV fluids  Nutrition: NPO pending SLP  Central lines / invasive devices: none  Code Status: DNR ACP documentation reviewed:  advanced directive on file in [facility]  TOC needs: TND Barriers to dispo / significant pending items: GI w/u may need palliative

## 2023-03-15 NOTE — Progress Notes (Signed)
PROGRESS NOTE    [name]   VHQ:[id number] DOB: 04-07-1945  DOA: 03/14/2023 Date of Service: 03/15/23 which is hospital day 1  PCP: Pcp, No    Hospital course / significant events:   HPI: Brian Burns is a 78 y.o. male with medical history significant of type 2 diabetes, essential hypertension BPH, vascular dementia, chronic kidney disease stage III, GERD, paroxysmal atrial fibrillation, coronary artery disease, seizure disorder, bilateral hearing loss, esophageal dysphagia among other things he is a resident of skilled nursing facility and has recently been having poor oral intake nausea and vomiting.  Patient has also exhibited some choking sensation when he ate.  He has been losing weight lately.  Patient will be seen by GI in the outpatient setting with plan to do EGD colonoscopy.  Recent CT scan has shown proctitis.  Workup in the process.  Patient also had poor baseline with failure to thrive.  Literally bedridden most of the time.    02/14: In the ER he was found to have evidence of dehydration.  He was still vomiting in the ER.  Creatinine increased from his baseline of 1.6--> 2.2.  Also BUN elevated.  Was admitted with intractable nausea vomiting and AKI  02/15: Cr back at baseline. Still nausea w/ dry heaves but pt is requesting Pepsi, has no other complaints or concerns but is not oriented. See belwo for discussion w/ daughter - will get CT c/a/p and ask GI to see him pending this      Consultants:  Gastroenterolofy   Procedures/Surgeries: none      ASSESSMENT & PLAN:   #1 intractable nausea with vomiting Ongoing weight loss and apparent dysphagia Hx esophageal dilatation / tortuous esophagus   No significant pain to concern for obstruction Question constipation component vs esophageal dysmotility/dysphagia  Bowel regimen - po if able to keep down po pills/meds otherwise will need to attempt enema  SLP eval prior to po meds Since renal fxn is improved will  get CT ches/abd/pelvis to eval GI tract as well as lungs for aspiration evidence   Advanced Care Planning: Detailed discussion over the phone today with daughter [name]. She confirms above history that over the past month or so, patient has had more difficulty swallowing and has been losing weight.  She notes that when she/other family members go over to help him eat, he is not keeping anything down.  She notes that he is often confused but has definitely seemed more disoriented lately. We discussed potential etiology for his nausea/vomiting or apparent dysphagia.   Problems which we could intervene on would include constipation, SBO, esophageal stricture, other similar issues.  Will ask GI to evaluate after we get CT scan as noted above. Problems which would be more difficult to manage, and which I suspect even if it is not so much initially now it almost certainly will be eventually, would be functional swallowing problems possibly related to previous dilatations or other esophageal dysfunction, or more likely advancement of dementia to the point where swallowing function is impacted. If we are not able to improve his symptoms/intervene, I would not recommend a feeding tube for this individual given his dementia.  Daughter is understanding and agreeable about this, we discussed that patients with dementia are more likely to try to pull the tubes, the procedure to do the insertion is not without risk, they are still at significant risk of aspiration with feeding tube.  Daughter agrees that a feeding tube would not be  part of the plan for him and would be agreeable to palliative/hospice if he is not improving/if we do not end up finding a diagnosis which would be amenable to reasonable interventions   AKI: improved  Appears to be prerenal in nature.   hydrate  monitor renal function.   type 2 diabetes: Has not eating much.   Monitor Glc  May need D5 fluids if remaining npo     essential  hypertension:  Blood pressure is low.  Hold blood pressure medications.   vascular dementia:  History largely obtained from daughter.  Patient is stable however no agitation.   coronary artery disease: No acute decompensation.  Continue to follow   seizure disorder: Confirm and resume home regimen as soon as racticable.   adult failure to thrive: PT and OT.  Patient lives in a nursing facility.    DVT prophylaxis: heparin IV fluids: d5 NaCl continuous IV fluids  Nutrition: NPO pending SLP  Central lines / invasive devices: none  Code Status: DNR ACP documentation reviewed:  advanced directive on file in [facility]  TOC needs: TND Barriers to dispo / significant pending items: GI w/u may need palliative              Subjective / Brief ROS:  Patient reports wants pepsi no other concerns    Family Communication: spoke w/ daughter see A/P    Objective Findings:  Vitals:   03/14/23 1952 03/14/23 2328 03/15/23 0411 03/15/23 0752  BP: 102/86 97/66 107/71 110/62  Pulse: 71 65 65 65  Resp: 15 20 16 18   Temp: 98.1 F (36.7 C) 97.6 F (36.4 C) 98.2 F (36.8 C) 97.8 F (36.6 C)  TempSrc:      SpO2: 97% 99% 98% 100%  Weight:      Height:        Intake/Output Summary (Last 24 hours) at 03/15/2023 1519 Last data filed at 03/15/2023 0518 Gross per 24 hour  Intake 1508.02 ml  Output --  Net 1508.02 ml   Filed Weights   03/14/23 1542  Weight: 69.2 kg    Examination:  Physical Exam Constitutional:      General: He is not in acute distress. Cardiovascular:     Rate and Rhythm: Normal rate and regular rhythm.  Pulmonary:     Effort: Pulmonary effort is normal.     Breath sounds: Normal breath sounds.  Abdominal:     General: Bowel sounds are normal.     Palpations: Abdomen is soft.  Musculoskeletal:     Right lower leg: No edema.     Left lower leg: No edema.  Skin:    General: Skin is warm and dry.  Neurological:     Mental Status: He is alert. He is  disoriented.          Scheduled Medications:   heparin injection (subcutaneous)  5,000 Units Subcutaneous Q8H   pantoprazole (PROTONIX) IV  40 mg Intravenous Q24H    Continuous Infusions:  dextrose 5 % and 0.9 % NaCl 125 mL/hr at 03/15/23 1325    PRN Medications:  morphine injection, ondansetron **OR** ondansetron (ZOFRAN) IV, sodium phosphate  Antimicrobials from admission:  Anti-infectives (From admission, onward)    None           Data Reviewed:  I have personally reviewed the following...  CBC: Recent Labs  Lab 03/14/23 1636 03/15/23 1109  WBC 5.2 5.3  NEUTROABS 3.3  --   HGB 11.6* 10.2*  HCT 37.2* 33.2*  MCV 93.0 93.5  PLT 160 133*   Basic Metabolic Panel: Recent Labs  Lab 03/14/23 1636 03/15/23 1109  NA 137 139  K 4.3 3.6  CL 103 106  CO2 24 25  GLUCOSE 118* 109*  BUN 31* 25*  CREATININE 2.02* 1.61*  CALCIUM 8.9 8.3*   GFR: Estimated Creatinine Clearance: 38.2 mL/min (A) (by C-G formula based on SCr of 1.61 mg/dL (H)). Liver Function Tests: Recent Labs  Lab 03/14/23 1636 03/15/23 1109  AST 27 25  ALT 19 17  ALKPHOS 89 76  BILITOT 1.0 0.7  PROT 7.9 6.8  ALBUMIN 3.1* 2.7*   No results for input(s): "LIPASE", "AMYLASE" in the last 168 hours. No results for input(s): "AMMONIA" in the last 168 hours. Coagulation Profile: No results for input(s): "INR", "PROTIME" in the last 168 hours. Cardiac Enzymes: No results for input(s): "CKTOTAL", "CKMB", "CKMBINDEX", "TROPONINI" in the last 168 hours. BNP (last 3 results) No results for input(s): "PROBNP" in the last 8760 hours. HbA1C: No results for input(s): "HGBA1C" in the last 72 hours. CBG: No results for input(s): "GLUCAP" in the last 168 hours. Lipid Profile: No results for input(s): "CHOL", "HDL", "LDLCALC", "TRIG", "CHOLHDL", "LDLDIRECT" in the last 72 hours. Thyroid Function Tests: No results for input(s): "TSH", "T4TOTAL", "FREET4", "T3FREE", "THYROIDAB" in the last 72  hours. Anemia Panel: No results for input(s): "VITAMINB12", "FOLATE", "FERRITIN", "TIBC", "IRON", "RETICCTPCT" in the last 72 hours. Most Recent Urinalysis On File:     Component Value Date/Time   COLORURINE AMBER (A) 05/18/2020 1702   APPEARANCEUR CLOUDY (A) 05/18/2020 1702   LABSPEC 1.027 05/18/2020 1702   PHURINE 5.0 05/18/2020 1702   GLUCOSEU NEGATIVE 05/18/2020 1702   HGBUR MODERATE (A) 05/18/2020 1702   HGBUR negative 06/19/2007 1522   BILIRUBINUR NEGATIVE 05/18/2020 1702   BILIRUBINUR negative 10/29/2018 0000   BILIRUBINUR SMALL 09/20/2014 1622   KETONESUR NEGATIVE 05/18/2020 1702   PROTEINUR 30 (A) 05/18/2020 1702   UROBILINOGEN 0.2 10/29/2018 0000   UROBILINOGEN 1.0 07/14/2017 1235   NITRITE POSITIVE (A) 05/18/2020 1702   LEUKOCYTESUR LARGE (A) 05/18/2020 1702   Sepsis Labs: @LABRCNTIP (procalcitonin:4,lacticidven:4) Microbiology: No results found for this or any previous visit (from the past 240 hours).    Radiology Studies last 3 days: DG Abdomen Acute W/Chest Result Date: 03/14/2023 CLINICAL DATA:  choking, vomiting EXAM: DG ABDOMEN ACUTE WITH 1 VIEW CHEST COMPARISON:  Chest x-ray 06/26/2021 FINDINGS: Similar-appearing widened mediastinum consistent with known underlying esophageal dilatation in tortuosity. Other the heart and mediastinal contours are unchanged. No focal consolidation. No pulmonary edema. No pleural effusion. No pneumothorax. Stool throughout the colon. There is no evidence of dilated bowel loops or free intraperitoneal air. No radiopaque calculi or other significant radiographic abnormality is seen. Atherosclerotic plaque. No acute osseous abnormality. IMPRESSION: 1. No acute cardiopulmonary disease. 2. Nonobstructive bowel gas pattern. 3. Stool throughout the colon-correlate for constipation. 4. Similar-appearing widened mediastinum consistent with known underlying esophageal dilatation in tortuosity. Electronically Signed   By: Tish Frederickson M.D.   On:  03/14/2023 18:13       Time spent: 50 min     Sunnie Nielsen, DO [facility] 03/15/2023, 3:19 PM    Dictation software may have been used to generate the above note. Typos may occur and escape review in typed/dictated notes. Please contact Dr Lyn Hollingshead directly for clarity if needed.  Staff may message me via secure chat in Epic  but this may not receive an immediate response,  please page me for urgent matters!  If 7PM-7AM, please contact night coverage [email or web address]

## 2023-03-15 NOTE — TOC Initial Note (Signed)
Transition of Care [facility]) - Initial/Assessment Note    Patient Details  Name: Brian Burns MRN: [id number] Date of Birth: [birth year]  Transition of Care [facility]) CM/SW Contact:    Amada Jupiter, LCSW Phone Number: 03/15/2023, 2:30 PM  Clinical Narrative:                  Met with pt to introduce TOC/ CSW role with dc planning.  Pt confirms with head nod that he is admitted here from [facility].  He is agreeable for CSW to speak with his daughter, Lynden Ang, as he is sleepy and not feeling well.  Able to confirm with daughter that pt is a LTC SNF resident at [facility] and plan is for him to return when medically ready.  Daughter aware TOC will follow along and assist with the return when appropriate.  Expected Discharge Plan: Long Term Nursing Home Barriers to Discharge: Continued Medical Work up   Patient Goals and CMS Choice Patient states their goals for this hospitalization and ongoing recovery are:: does not verbalize but nods "yes" to return to SNF          Expected Discharge Plan and Services In-house Referral: Clinical Social Work   Post Acute Care Choice: Nursing Home Living arrangements for the past 2 months: Skilled Nursing Facility                 DME Arranged: N/A DME Agency: NA                  Prior Living Arrangements/Services Living arrangements for the past 2 months: Skilled Nursing Facility Lives with:: Facility Resident Patient language and need for interpreter reviewed:: Yes Do you feel safe going back to the place where you live?: Yes      Need for Family Participation in Patient Care: No (Comment) Care giver support system in place?: Yes (comment)   Criminal Activity/Legal Involvement Pertinent to Current Situation/Hospitalization: No - Comment as needed  Activities of Daily Living   ADL Screening (condition at time of admission) Independently performs ADLs?: No Does the patient have a NEW difficulty with  bathing/dressing/toileting/self-feeding that is expected to last >3 days?: No Does the patient have a NEW difficulty with getting in/out of bed, walking, or climbing stairs that is expected to last >3 days?: No Does the patient have a NEW difficulty with communication that is expected to last >3 days?: No Is the patient deaf or have difficulty hearing?: No Does the patient have difficulty seeing, even when wearing glasses/contacts?: No Does the patient have difficulty concentrating, remembering, or making decisions?: No  Permission Sought/Granted Permission sought to share information with : Family Supports, Magazine features editor Permission granted to share information with : Yes, Verbal Permission Granted  Share Information with NAME: daughter, [name] @ [phone]  Permission granted to share info w AGENCY: [facility]        Emotional Assessment Appearance:: Appears older than stated age Attitude/Demeanor/Rapport: Gracious Affect (typically observed): Accepting Orientation: : Oriented to Self, Oriented to Place Alcohol / Substance Use: Not Applicable Psych Involvement: No (comment)  Admission diagnosis:  Dehydration [E86.0] AKI (acute kidney injury) (HCC) [N17.9] Patient Active Problem List   Diagnosis Date Noted   AKI (acute kidney injury) (HCC) 03/14/2023   Blood in stool 11/05/2022   Nausea 11/05/2022   Altered mental status    Acute right hemiparesis (HCC) 05/18/2020   Abnormality of gait 09/27/2019   Seizure (HCC)    Urge incontinence  12/08/2018   Stenosis of lateral recess of lumbar spine 12/08/2018   Dementia, vascular (HCC) 12/08/2018   Decreased visual acuity of left eye  12/02/2018   Gross hematuria 10/29/2018   Other abnormalities of gait and mobility 09/02/2018   Muscle weakness (generalized) 04/08/2018   Lumbar spinal stenosis 04/05/2018   Chronic pain of right knee 03/07/2017   Chronic low back pain without sciatica 03/07/2017    Nonischemic cardiomyopathy (HCC) 09/25/2016   Microalbuminuria 07/26/2015   Paroxysmal atrial fibrillation (HCC)    Coronary artery disease involving native coronary artery of native heart without angina pectoris    Stricture, urethra    CKD stage 3 due to type 2 diabetes mellitus (HCC) 03/16/2015   Depression 03/16/2015   Normocytic anemia    History of Multiple lacunar infarcts (HCC) 01/31/2015   Esophageal dysphagia    Moderate protein-calorie malnutrition (HCC) 01/16/2015   Unintentional weight loss 03/18/2014   GERD (gastroesophageal reflux disease) 10/21/2013   AAA (abdominal aortic aneurysm) without rupture (HCC) 06/24/2013   PVD (peripheral vascular disease) (HCC) 06/24/2013   Orthostatic dizziness 03/18/2013   Hearing loss of both ears 03/18/2013   BPH (benign prostatic hyperplasia) 02/16/2013   Type 2 diabetes mellitus with diabetic neuropathy, unspecified (HCC) 05/10/2012   Type 2 diabetes mellitus with hypercholesterolemia (HCC) 02/05/2007   Hypertension associated with diabetes (HCC) 03/27/2006   PCP:  Oneita Hurt No Pharmacy:   [facility] - [place], [place] - [address] [place] [address] Phone: [phone] Fax: [phone]     Social Drivers of Health (SDOH) Social History: SDOH Screenings   Food Insecurity: No Food Insecurity (03/14/2023)  Housing: Unknown (03/14/2023)  Transportation Needs: No Transportation Needs (03/14/2023)  Utilities: Not At Risk (03/14/2023)  Depression (PHQ2-9): Medium Risk (03/29/2020)  Financial Resource Strain: Low Risk  (06/17/2018)  Social Connections: Moderately Integrated (03/14/2023)  Tobacco Use: Medium Risk (03/15/2023)   SDOH Interventions:     Readmission Risk Interventions     No data to display

## 2023-03-15 NOTE — Plan of Care (Signed)

## 2023-03-16 ENCOUNTER — Encounter (HOSPITAL_COMMUNITY): Payer: Self-pay | Admitting: Internal Medicine

## 2023-03-16 DIAGNOSIS — R110 Nausea: Secondary | ICD-10-CM

## 2023-03-16 DIAGNOSIS — N1831 Chronic kidney disease, stage 3a: Secondary | ICD-10-CM

## 2023-03-16 DIAGNOSIS — Z66 Do not resuscitate: Secondary | ICD-10-CM

## 2023-03-16 DIAGNOSIS — R1311 Dysphagia, oral phase: Secondary | ICD-10-CM | POA: Diagnosis not present

## 2023-03-16 DIAGNOSIS — N401 Enlarged prostate with lower urinary tract symptoms: Secondary | ICD-10-CM

## 2023-03-16 DIAGNOSIS — R3914 Feeling of incomplete bladder emptying: Secondary | ICD-10-CM

## 2023-03-16 DIAGNOSIS — F0150 Vascular dementia without behavioral disturbance: Secondary | ICD-10-CM

## 2023-03-16 DIAGNOSIS — N179 Acute kidney failure, unspecified: Secondary | ICD-10-CM | POA: Diagnosis not present

## 2023-03-16 DIAGNOSIS — I480 Paroxysmal atrial fibrillation: Secondary | ICD-10-CM

## 2023-03-16 LAB — BASIC METABOLIC PANEL
Anion gap: 7 (ref 5–15)
BUN: 18 mg/dL (ref 8–23)
CO2: 23 mmol/L (ref 22–32)
Calcium: 8.2 mg/dL — ABNORMAL LOW (ref 8.9–10.3)
Chloride: 111 mmol/L (ref 98–111)
Creatinine, Ser: 1.5 mg/dL — ABNORMAL HIGH (ref 0.61–1.24)
GFR, Estimated: 48 mL/min — ABNORMAL LOW (ref >60)
Glucose, Bld: 99 mg/dL (ref 70–99)
Potassium: 4.2 mmol/L (ref 3.5–5.1)
Sodium: 141 mmol/L (ref 135–145)

## 2023-03-16 LAB — CBC
HCT: 32.5 % — ABNORMAL LOW (ref 39.0–52.0)
Hemoglobin: 9.5 g/dL — ABNORMAL LOW (ref 13.0–17.0)
MCH: 28.7 pg (ref 26.0–34.0)
MCHC: 29.2 g/dL — ABNORMAL LOW (ref 30.0–36.0)
MCV: 98.2 fL (ref 80.0–100.0)
Platelets: 106 10*3/uL — ABNORMAL LOW (ref 150–400)
RBC: 3.31 MIL/uL — ABNORMAL LOW (ref 4.22–5.81)
RDW: 17.6 % — ABNORMAL HIGH (ref 11.5–15.5)
WBC: 3.7 10*3/uL — ABNORMAL LOW (ref 4.0–10.5)
nRBC: 0 % (ref 0.0–0.2)

## 2023-03-16 MED ORDER — LACTATED RINGERS IV SOLN: INTRAVENOUS | Status: AC

## 2023-03-16 MED ORDER — PANTOPRAZOLE SODIUM 40 MG PO TBEC
40.0000 mg | DELAYED_RELEASE_TABLET | Freq: Every day | ORAL | Status: DC
  Administered 2023-03-16 – 2023-03-25 (×5): 40 mg via ORAL
  Filled 2023-03-16 (×10): qty 1

## 2023-03-16 NOTE — Assessment & Plan Note (Signed)
03-16-2023 due to poor po intake. Not on any SSI. 03-17-2023 continue SSI. 03-18-2023 stable

## 2023-03-16 NOTE — Assessment & Plan Note (Signed)
03-16-2023 holding eliquis for now. 03-17-2023 stable. Continue to hold Eliquis. 03-18-2023 stable.

## 2023-03-16 NOTE — Assessment & Plan Note (Signed)
03-16-2023 stable. 03-17-2023 stable 03-18-2023 stable.

## 2023-03-16 NOTE — Assessment & Plan Note (Signed)
03-16-2023 BP stable off any HTN meds. 03-18-2023 stable off HTN meds.

## 2023-03-16 NOTE — Subjective & Objective (Signed)
Pt seen and examined. No family at bedside. Discussed with GI Dr. Elnoria Howard. No utility in obtaining CT abd/pelvis right now. SLP has seen patient. Diet will be ordered.

## 2023-03-16 NOTE — Assessment & Plan Note (Signed)
03-16-2023 seen by ST today. MBS tomorrow. On dysphagia-1 diet(mechanical soft diet) for now pending MBS tomorrow. 03-17-2023 MBS today. Thanks for GI consult. They are planning EGD this admission.  03-18-2023 EGD in AM. Discussed with dtr [name] that pt would not benefit from PEG.

## 2023-03-16 NOTE — Progress Notes (Signed)
PROGRESS NOTE    Brian Burns  WRU:[id number] DOB: 1945-03-01 DOA: 03/14/2023 PCP: Pcp, No  Subjective: Pt seen and examined. No family at bedside. Discussed with GI Dr. Elnoria Howard. No utility in obtaining CT abd/pelvis right now. SLP has seen patient. Diet will be ordered.   Hospital Course: HPI/Hospital Course: Brian Burns is a 78 y.o. male with medical history significant of type 2 diabetes, essential hypertension BPH, vascular dementia, chronic kidney disease stage III, GERD, paroxysmal atrial fibrillation, coronary artery disease, seizure disorder, bilateral hearing loss, esophageal dysphagia among other things he is a resident of skilled nursing facility and has recently been having poor oral intake nausea and vomiting.  Patient has also exhibited some choking sensation when he ate.  He has been losing weight lately.  Patient will be seen by GI in the outpatient setting with plan to do EGD colonoscopy.  Recent CT scan has shown proctitis.  Workup in the process.  Patient also had poor baseline with failure to thrive.  Literally bedridden most of the time.  In the ER he was found to have evidence of dehydration.  He is still vomiting in the ER.  Creatinine has increased from his baseline of 1.6-2.2.  Also BUN elevated.  Being admitted with intractable nausea vomiting and AKI   02/14: In the ER he was found to have evidence of dehydration.  He was still vomiting in the ER.  Creatinine increased from his baseline of 1.6--> 2.2.  Also BUN elevated.  Was admitted with intractable nausea vomiting and AKI  02/15: Cr back at baseline. Still nausea w/ dry heaves but pt is requesting Pepsi, has no other complaints or concerns but is not oriented. See belwo for discussion w/ daughter - will get CT c/a/p and ask GI to see him pending this   Significant Events: Admitted 03/14/2023 for intractable N/V   Significant Labs: WBC 5.2, HgB 11.6, plt 160 Na 137, K 4.3, CO2 of 24, BUN 31, scr  2.02  Significant Imaging Studies: CXR/KUB shows No acute cardiopulmonary disease. 2. Nonobstructive bowel gas pattern. 3. Stool throughout the colon-correlate for constipation. 4. Similar-appearing widened mediastinum consistent with known underlying esophageal dilatation in tortuosity  Antibiotic Therapy: Anti-infectives (From admission, onward)    None       Procedures:   Consultants: GI      Assessment and Plan: * Acute kidney injury superimposed on stage 3a chronic kidney disease (HCC) - baseline SCr 1.6 03-15-2023 continue with IVF overnight. Monitor Scr in AM. Scr 1.5 today  Oral phase dysphagia 03-15-2023 seen by ST today. MBS tomorrow. On dysphagia-1 diet(mechanical soft diet) for now pending MBS tomorrow.  Dementia, vascular (HCC) 03-15-2023 chronic. Attending yesterday talked with pt's dtr Brian Burns. Pt with known dementia and getting worse. Dtr is not interested in PEG tube.  Nausea 03-16-2023 resolved. Prn zofran.  DNR (do not resuscitate)/DNI(Do Not intubate) 03-16-2023 pt made DNR on arrival.  Seizure [facility]) 03-16-2023 stable. Wasn't taking any seizure meds.  Coronary artery disease involving native coronary artery of native heart without angina pectoris 03-16-2023 stable.  Paroxysmal atrial fibrillation (HCC) 03-16-2023 holding eliquis for now.  CKD stage 3a, GFR 45-59 ml/min (HCC) - baseline scr 1.6 03-16-2023 stable.  GERD (gastroesophageal reflux disease) 03-16-2023 change to po protonix.  BPH (benign prostatic hyperplasia) 03-16-2023 continue proscar  Hypertension associated with diabetes (HCC) 03-16-2023 BP stable off any HTN meds.  Type 2 diabetes mellitus with hypercholesterolemia (HCC) 03-15-2023 due to poor po intake. Not on any  SSI.  DVT prophylaxis: heparin injection 5,000 Units Start: 03/15/23 1615    Code Status: Limited: Do not attempt resuscitation (DNR) -DNR-LIMITED -Do Not Intubate/DNI  Family Communication: no family  at bedside Disposition Plan: return to SNF Reason for continuing need for hospitalization: remains on IVF. Awaiting MBS  Objective: Vitals:   03/15/23 0752 03/15/23 2002 03/16/23 0418 03/16/23 1203  BP: 110/62 108/72 102/72 102/75  Pulse: 65 60 65 62  Resp: 18 18 18 18   Temp: 97.8 F (36.6 C) 97.9 F (36.6 C) 98.1 F (36.7 C) (!) 97.4 F (36.3 C)  TempSrc:  Oral Oral Oral  SpO2: 100% 100% 98% 100%  Weight:      Height:        Intake/Output Summary (Last 24 hours) at 03/16/2023 1503 Last data filed at 03/16/2023 0420 Gross per 24 hour  Intake 120 ml  Output 200 ml  Net -80 ml   Filed Weights   03/14/23 1542  Weight: 69.2 kg    Examination:  Physical Exam Vitals and nursing note reviewed.  Constitutional:      General: He is not in acute distress.    Appearance: He is not toxic-appearing or diaphoretic.     Comments: Awake. States he "wants a Pepsi"  HENT:     Head: Normocephalic and atraumatic.     Nose: Nose normal.  Cardiovascular:     Rate and Rhythm: Normal rate and regular rhythm.  Pulmonary:     Effort: Pulmonary effort is normal.     Breath sounds: Normal breath sounds.  Abdominal:     General: Bowel sounds are normal.     Palpations: Abdomen is soft.  Musculoskeletal:     Right lower leg: No edema.     Left lower leg: No edema.  Skin:    General: Skin is warm and dry.     Capillary Refill: Capillary refill takes less than 2 seconds.  Neurological:     Mental Status: He is disoriented.     Data Reviewed: I have personally reviewed following labs and imaging studies  CBC: Recent Labs  Lab 03/14/23 1636 03/15/23 1109 03/16/23 0947  WBC 5.2 5.3 3.7*  NEUTROABS 3.3  --   --   HGB 11.6* 10.2* 9.5*  HCT 37.2* 33.2* 32.5*  MCV 93.0 93.5 98.2  PLT 160 133* 106*   Basic Metabolic Panel: Recent Labs  Lab 03/14/23 1636 03/15/23 1109 03/16/23 0947  NA 137 139 141  K 4.3 3.6 4.2  CL 103 106 111  CO2 24 25 23   GLUCOSE 118* 109* 99  BUN  31* 25* 18  CREATININE 2.02* 1.61* 1.50*  CALCIUM 8.9 8.3* 8.2*   GFR: Estimated Creatinine Clearance: 41 mL/min (A) (by C-G formula based on SCr of 1.5 mg/dL (H)). Liver Function Tests: Recent Labs  Lab 03/14/23 1636 03/15/23 1109  AST 27 25  ALT 19 17  ALKPHOS 89 76  BILITOT 1.0 0.7  PROT 7.9 6.8  ALBUMIN 3.1* 2.7*   Radiology Studies: DG Abdomen Acute W/Chest Result Date: 03/14/2023 CLINICAL DATA:  choking, vomiting EXAM: DG ABDOMEN ACUTE WITH 1 VIEW CHEST COMPARISON:  Chest x-ray 06/26/2021 FINDINGS: Similar-appearing widened mediastinum consistent with known underlying esophageal dilatation in tortuosity. Other the heart and mediastinal contours are unchanged. No focal consolidation. No pulmonary edema. No pleural effusion. No pneumothorax. Stool throughout the colon. There is no evidence of dilated bowel loops or free intraperitoneal air. No radiopaque calculi or other significant radiographic abnormality is seen. Atherosclerotic plaque.  No acute osseous abnormality. IMPRESSION: 1. No acute cardiopulmonary disease. 2. Nonobstructive bowel gas pattern. 3. Stool throughout the colon-correlate for constipation. 4. Similar-appearing widened mediastinum consistent with known underlying esophageal dilatation in tortuosity. Electronically Signed   By: Tish Frederickson M.D.   On: 03/14/2023 18:13    Scheduled Meds:  heparin injection (subcutaneous)  5,000 Units Subcutaneous Q8H   pantoprazole  40 mg Oral Daily   Continuous Infusions:  lactated ringers 100 mL/hr at 03/16/23 1039     LOS: 2 days   Time spent: 40 minutes  Carollee Herter, DO  [facility]  03/16/2023, 3:03 PM

## 2023-03-16 NOTE — Assessment & Plan Note (Signed)
03-16-2023 stable. Wasn't taking any seizure meds. 03-17-2023 stable 03-18-2023 stable.

## 2023-03-16 NOTE — Plan of Care (Signed)

## 2023-03-16 NOTE — [place] FL2 (Signed)
[place] MEDICA[place] FL2 LEVEL OF CARE FORM     IDENTIFICATION  Patient Name: Brian Burns Birthdate: 03-18-1945 Sex: male Admission Date (Current Location): 03/14/2023  [place] [place] [facility] and [place] Number:  Producer, television/film/video and Address:  [facility],  [address] [place], [place] [address]      Provider Number: [id number]  Attending Physician Name and Address:  Carollee Herter, DO  Relative Name and Phone Number:  [name] (Daughter)  [phone] (Mobile)    Current Level of Care: Hospital Recommended Level of Care: Nursing Facility (LTC at [facility]) Prior Approval Number:    Date Approved/Denied: 04/05/20 PASRR Number: [id number]  Discharge Plan: SNF    Current Diagnoses: Patient Active Problem List   Diagnosis Date Noted   DNR (do not resuscitate)/DNI(Do Not intubate) 03/16/2023   AKI (acute kidney injury) (HCC) 03/14/2023   Blood in stool 11/05/2022   Nausea 11/05/2022   Altered mental status    Acute right hemiparesis (HCC) 05/18/2020   Abnormality of gait 09/27/2019   Seizure (HCC)    Urge incontinence 12/08/2018   Stenosis of lateral recess of lumbar spine 12/08/2018   Dementia, vascular (HCC) 12/08/2018   Decreased visual acuity of left eye  12/02/2018   Gross hematuria 10/29/2018   Other abnormalities of gait and mobility 09/02/2018   Muscle weakness (generalized) 04/08/2018   Lumbar spinal stenosis 04/05/2018   Chronic pain of right knee 03/07/2017   Chronic low back pain without sciatica 03/07/2017   Nonischemic cardiomyopathy (HCC) 09/25/2016   Microalbuminuria 07/26/2015   Paroxysmal atrial fibrillation (HCC)    Coronary artery disease involving native coronary artery of native heart without angina pectoris    Stricture, urethra    CKD stage 3 due to type 2 diabetes mellitus (HCC) 03/16/2015   Depression 03/16/2015   Normocytic anemia    History of Multiple lacunar infarcts (HCC) 01/31/2015   Esophageal dysphagia    Moderate  protein-calorie malnutrition (HCC) 01/16/2015   Unintentional weight loss 03/18/2014   GERD (gastroesophageal reflux disease) 10/21/2013   AAA (abdominal aortic aneurysm) without rupture (HCC) 06/24/2013   PVD (peripheral vascular disease) (HCC) 06/24/2013   Orthostatic dizziness 03/18/2013   Hearing loss of both ears 03/18/2013   BPH (benign prostatic hyperplasia) 02/16/2013   Type 2 diabetes mellitus with diabetic neuropathy, unspecified (HCC) 05/10/2012   Type 2 diabetes mellitus with hypercholesterolemia (HCC) 02/05/2007   Hypertension associated with diabetes (HCC) 03/27/2006    Orientation RESPIRATION BLADDER Height & Weight     Self, Situation, Place  Normal Incontinent Weight: 152 lb 9.6 oz (69.2 kg) Height:  6' (182.9 cm)  BEHAVIORAL SYMPTOMS/MOOD NEUROLOGICAL BOWEL NUTRITION STATUS      Incontinent Diet (FEES and diet order)  AMBULATORY STATUS COMMUNICATION OF NEEDS Skin   Total Care Verbally Normal                       Personal Care Assistance Level of Assistance  Total care           Functional Limitations Info  Sight, Hearing, Speech Sight Info: Adequate Hearing Info: Adequate Speech Info: Adequate    SPECIAL CARE FACTORS FREQUENCY                       Contractures Contractures Info: Not present    Additional Factors Info  Code Status, Allergies Code Status Info: DNR Allergies Info: Gabapentin           Current Medications (03/16/2023):  This is the current hospital active medication list Current Facility-Administered Medications  Medication Dose Route Frequency Provider Last Rate Last Admin   bisacodyl (DULCOLAX) suppository 10 mg  10 mg Rectal Daily PRN Sunnie Nielsen, DO       heparin injection 5,000 Units  5,000 Units Subcutaneous Q8H Sunnie Nielsen, DO   5,000 Units at 03/16/23 8657   lactated ringers infusion   Intravenous Continuous Carollee Herter, DO 100 mL/hr at 03/16/23 1039 New Bag at 03/16/23 1039   morphine (PF) 2  MG/ML injection 2 mg  2 mg Intravenous Q2H PRN Rometta Emery, MD       ondansetron (ZOFRAN) tablet 4 mg  4 mg Oral Q6H PRN Rometta Emery, MD       Or   ondansetron (ZOFRAN) injection 4 mg  4 mg Intravenous Q6H PRN Rometta Emery, MD   4 mg at 03/15/23 0217   pantoprazole (PROTONIX) injection 40 mg  40 mg Intravenous Q24H Sunnie Nielsen, DO   40 mg at 03/15/23 1631   sodium phosphate (FLEET) enema 1 enema  1 enema Rectal Daily PRN Sunnie Nielsen, DO         Discharge Medications: Please see discharge summary for a list of discharge medications.  Relevant Imaging Results:  Relevant Lab Results:   Additional Information SSN [id number]  Diona Browner, LCSW

## 2023-03-16 NOTE — Assessment & Plan Note (Signed)
03-16-2023 resolved. Prn zofran.

## 2023-03-16 NOTE — Plan of Care (Signed)

## 2023-03-16 NOTE — Evaluation (Signed)
Clinical/Bedside Swallow Evaluation Patient Details  Name: Brian Burns MRN: [id number] Date of Birth: Aug 17, 1945  Today's Date: 03/16/2023 Time: SLP Start Time (ACUTE ONLY): 1350 SLP Stop Time (ACUTE ONLY): 1400 SLP Time Calculation (min) (ACUTE ONLY): 10 min  Past Medical History:  Past Medical History:  Diagnosis Date   AAA (abdominal aortic aneurysm) without rupture (HCC) 06/2014   no change in last exam from 2015   Achalasia    Acute right hemiparesis (HCC) 05/18/2020   Anxiety    Arthritis    knees,lower back   Atrial fibrillation (HCC)    Atrial flutter with rapid ventricular response (HCC) 01/16/2015   Bilateral wrist pain 09/09/2014   Bladder outlet obstruction 01/20/2015   Bradycardia 08/27/2016   CAD (coronary artery disease)    pt denies   Carotid artery disease (HCC)    Chronic back pain    Chronic kidney disease (CKD), stage III (moderate) (HCC)    Constipation    Decreased visual acuity of left eye  12/02/2018   Depression    Dysphagia, oropharyngeal phase    Dyspnea    reports today no pregoression   Erectile dysfunction    GERD (gastroesophageal reflux disease)    Headache    hx of   History of CVA (cerebrovascular accident)    2009  &  2013   History of Multiple lacunar infarcts (HCC) 01/31/2015   MRI Brain 01/31/2015: Widespread old lacunar infarcts, particularly right Basal ganglia, pons, and right cerebellum   HLD (hyperlipidemia)    Hypertension    Idiopathic pancreatitis 12/2014   acute   Knee pain, bilateral 04/09/2011   Malnutrition of moderate degree 01/25/2015   Memory impairment    mild dementia per PCP progress note    Morbid obesity (HCC) 12/03/2018   Other abnormalities of gait and mobility 12/03/2018   PVD (peripheral vascular disease) with claudication (HCC)    Type 2 diabetes mellitus (HCC)    Urethral stricture 02/16/2013   Urge incontinence 12/08/2018   Vascular dementia [facility])    Past Surgical History:  Past Surgical  History:  Procedure Laterality Date   back injections     [facility]   BALLOON DILATION N/A 12/16/2018   Procedure: URETHRAL BALLOON DILATION;  Surgeon: Crist Fat, MD;  Location: [facility];  Service: Urology;  Laterality: N/A;   BIOPSY  05/22/2020   Procedure: BIOPSY;  Surgeon: [name], MD;  Location: [facility];  Service: Gastroenterology;;   CARDIOVASCULAR STRESS TEST  08-09-2009   mild global hypokinesis,  no ischemia or scar/  ef 41%   CATARACT EXTRACTION W/ INTRAOCULAR LENS  IMPLANT, BILATERAL  april and may 2014   CYSTOSCOPY N/A 01/17/2017   Procedure: CYSTOSCOPY FLEXIBLE;  Surgeon: Crist Fat, MD;  Location: [facility];  Service: Urology;  Laterality: N/A;   CYSTOSCOPY WITH RETROGRADE URETHROGRAM N/A 11/24/2014   Procedure: CYSTOSCOPY WITH RETROGRADE URETHROGRAM;  Surgeon: Crist Fat, MD;  Location: [facility];  Service: Urology;  Laterality: N/A;   CYSTOSCOPY WITH RETROGRADE URETHROGRAM N/A 12/16/2018   Procedure: CYSTOSCOPY WITH RETROGRADE URETHROGRAM;  Surgeon: Crist Fat, MD;  Location: [facility];  Service: Urology;  Laterality: N/A;   CYSTOSCOPY WITH RETROGRADE URETHROGRAM N/A 12/16/2019   Procedure: CYSTOSCOPY WITH RETROGRADE, Direct Vision Internal Urethrotomy;  Surgeon: Crist Fat, MD;  Location: [facility];  Service: Urology;  Laterality: N/A;   CYSTOSCOPY WITH URETHRAL DILATATION N/A 11/24/2014   Procedure: CYSTOSCOPY WITH URETHRAL DILATATION;  Surgeon: Earle Gell  Marlou Porch, MD;  Location: [facility];  Service: Urology;  Laterality: N/A;  BALLOON DILATION         ESOPHAGEAL DILATION  05/22/2020   Procedure: ESOPHAGEAL DILATION;  Surgeon: [name], MD;  Location: [facility];  Service: Gastroenterology;;   ESOPHAGOGASTRODUODENOSCOPY N/A 05/15/2014   Procedure: ESOPHAGOGASTRODUODENOSCOPY (EGD);  Surgeon: Carman Ching, MD;  Location: [facility];  Service: Endoscopy;  Laterality:  N/A;   ESOPHAGOGASTRODUODENOSCOPY Left 02/02/2015   Procedure: ESOPHAGOGASTRODUODENOSCOPY (EGD);  Surgeon: Dorena Cookey, MD;  Location: [facility];  Service: Endoscopy;  Laterality: Left;   ESOPHAGOGASTRODUODENOSCOPY Left 03/16/2015   Procedure: ESOPHAGOGASTRODUODENOSCOPY (EGD);  Surgeon: Dorena Cookey, MD;  Location: [facility];  Service: Endoscopy;  Laterality: Left;   ESOPHAGOGASTRODUODENOSCOPY (EGD) WITH PROPOFOL N/A 05/22/2020   Procedure: ESOPHAGOGASTRODUODENOSCOPY (EGD) WITH PROPOFOL;  Surgeon: [name], MD;  Location: [facility];  Service: Gastroenterology;  Laterality: N/A;   FOREIGN BODY REMOVAL  05/22/2020   Procedure: FOREIGN BODY REMOVAL;  Surgeon: [name], MD;  Location: [facility];  Service: Gastroenterology;;   TRANSTHORACIC ECHOCARDIOGRAM  01-07-2012   mild to moderate dilated LV,  ef 45-50%,  mild basal hypokinesis,  grade I diastolic  dysfunction/  trivial AR/  mild MR   URETHROPLASTY N/A 01/17/2017   Procedure: URETHEROPLASTY BUCCAL GRAFT AND BUCCAL MUCOSA GRAFT HARVEST;  Surgeon: Crist Fat, MD;  Location: [facility];  Service: Urology;  Laterality: N/A;   HPI:  [name] is a 78 y.o. male with medical history significant of type 2 diabetes, essential hypertension BPH, vascular dementia, chronic kidney disease stage III, GERD, paroxysmal atrial fibrillation, coronary artery disease, seizure disorder, bilateral hearing loss, esophageal dysphagia among other things he is a resident of skilled nursing facility and has recently been having poor oral intake nausea and vomiting.  Patient has also exhibited some choking sensation when he ate.  He has been losing weight lately.  Chest xray was showing no acute cardiopulmonary disease. He is known to ST service with MBS completed on 05/19/2020 with concern for possible esophageal dysphagia.    Assessment / Plan / Recommendation  Clinical Impression  Clinical swallowing evaluation was completed using thin  liquids via spoon, cup and straw, pureed material and dual textured solids.  RN approved patient for PO intake.  The patient endorses a two week history of swallowing issues characterized by material coming back up.  Cranial nerve exam was completed and unremarkable.  Lingual, labial, facial and jaw range of motion appeared to be adequate.  Facial sensation appeared to be intact and he did not endorse a difference in sensation between the right and left side of his face.  He presented with a likely oral dysphagia with concern for a possible pharyngeal vs esophageal dysphagia.  Mastication of dual textured solids was very slow with oral residue seen post swallow.  Swallow trigger was appreciated to palpation and he appeared to have issues triggering a swallow.  However, overt s/s of aspiration were not seen. Suggest beginning a dysphagia 1 diet with thin liquids.  MBS will be completed tomorrow to fully assess swallowing physiology. SLP Visit Diagnosis: Dysphagia, unspecified (R13.10)    Aspiration Risk  Mild aspiration risk;Moderate aspiration risk    Diet Recommendation   Dysphagia 1 (puree);Thin  Medication Administration: Whole meds with puree    Other  Recommendations Recommended Consults: Consider GI evaluation Oral Care Recommendations: Oral care BID    Recommendations for follow up therapy are one component of a multi-disciplinary discharge planning process, led by  the attending physician.  Recommendations may be updated based on patient status, additional functional criteria and insurance authorization.  Follow up Recommendations Other (comment) (TBD)         Functional Status Assessment Patient has had a recent decline in their functional status and demonstrates the ability to make significant improvements in function in a reasonable and predictable amount of time.         Prognosis Prognosis for improved oropharyngeal function: Good      Swallow Study   General Date of Onset:  03/14/23 HPI: URIAS SHEEK is a 78 y.o. male with medical history significant of type 2 diabetes, essential hypertension BPH, vascular dementia, chronic kidney disease stage III, GERD, paroxysmal atrial fibrillation, coronary artery disease, seizure disorder, bilateral hearing loss, esophageal dysphagia among other things he is a resident of skilled nursing facility and has recently been having poor oral intake nausea and vomiting.  Patient has also exhibited some choking sensation when he ate.  He has been losing weight lately.  Chest xray was showing no acute cardiopulmonary disease. He is known to ST service with MBS completed on 05/19/2020 with concern for possible esophageal dysphagia. Type of Study: Bedside Swallow Evaluation Previous Swallow Assessment: MBS 05/19/2020 Diet Prior to this Study: NPO Temperature Spikes Noted: No Respiratory Status: Room air History of Recent Intubation: No Behavior/Cognition: Alert;Cooperative Oral Cavity Assessment: Within Functional Limits Oral Care Completed by SLP: No Oral Cavity - Dentition: Edentulous Vision: Functional for self-feeding Self-Feeding Abilities: Needs assist Patient Positioning: Upright in bed Baseline Vocal Quality: Normal Volitional Swallow: Unable to elicit    Oral/Motor/Sensory Function Overall Oral Motor/Sensory Function: Within functional limits   Ice Chips Ice chips: Not tested   Thin Liquid Thin Liquid: Impaired Presentation: Straw;Spoon;Cup Pharyngeal  Phase Impairments: Suspected delayed Swallow    Nectar Thick Nectar Thick Liquid: Not tested   Honey Thick Honey Thick Liquid: Not tested   Puree Puree: Impaired Presentation: Spoon;Self Fed Oral Phase Impairments: Impaired mastication Oral Phase Functional Implications: Prolonged oral transit   Solid     Solid: Impaired Presentation: Self Fed Oral Phase Impairments: Impaired mastication;Poor awareness of bolus Oral Phase Functional Implications: Oral  residue;Impaired mastication;Prolonged oral transit     Dimas Aguas, MA, CCC-SLP Acute Rehab SLP [phone]  Fleet Contras 03/16/2023,2:14 PM

## 2023-03-16 NOTE — Assessment & Plan Note (Signed)
03-16-2023 change to po protonix. 03-17-2023 stable 03-18-2023 stable.

## 2023-03-16 NOTE — Assessment & Plan Note (Signed)
03-16-2023 continue proscar 03-17-2023 stable. 03-18-2023 stable.

## 2023-03-16 NOTE — Assessment & Plan Note (Signed)
03-16-2023 chronic. Attending yesterday talked with pt's dtr Brian Burns. Pt with known dementia and getting worse. Dtr is not interested in PEG tube. 03-17-2023 chronic. 03-18-2023 chronic/stable. Reiterated to dtr Brian Burns that PEG in pt's with dementia would not prolong his life. That risk of him pulling it over is not worth benefit.

## 2023-03-16 NOTE — Assessment & Plan Note (Addendum)
03-16-2023 continue with IVF overnight. Monitor Scr in AM. Scr 1.5 today 03-17-2023 stable. Scr 1.44  03-18-2023 resolved AKI.

## 2023-03-16 NOTE — Assessment & Plan Note (Signed)
03-16-2023 pt made DNR on arrival.

## 2023-03-17 ENCOUNTER — Inpatient Hospital Stay (HOSPITAL_COMMUNITY): Payer: 59

## 2023-03-17 DIAGNOSIS — R627 Adult failure to thrive: Secondary | ICD-10-CM

## 2023-03-17 DIAGNOSIS — Z862 Personal history of diseases of the blood and blood-forming organs and certain disorders involving the immune mechanism: Secondary | ICD-10-CM

## 2023-03-17 DIAGNOSIS — E7800 Pure hypercholesterolemia, unspecified: Secondary | ICD-10-CM

## 2023-03-17 DIAGNOSIS — N179 Acute kidney failure, unspecified: Secondary | ICD-10-CM | POA: Diagnosis not present

## 2023-03-17 DIAGNOSIS — R1311 Dysphagia, oral phase: Secondary | ICD-10-CM | POA: Diagnosis not present

## 2023-03-17 DIAGNOSIS — N1831 Chronic kidney disease, stage 3a: Secondary | ICD-10-CM | POA: Diagnosis not present

## 2023-03-17 DIAGNOSIS — R112 Nausea with vomiting, unspecified: Secondary | ICD-10-CM

## 2023-03-17 DIAGNOSIS — K6289 Other specified diseases of anus and rectum: Secondary | ICD-10-CM

## 2023-03-17 DIAGNOSIS — R634 Abnormal weight loss: Secondary | ICD-10-CM | POA: Diagnosis not present

## 2023-03-17 DIAGNOSIS — K219 Gastro-esophageal reflux disease without esophagitis: Secondary | ICD-10-CM

## 2023-03-17 DIAGNOSIS — R1310 Dysphagia, unspecified: Secondary | ICD-10-CM | POA: Diagnosis not present

## 2023-03-17 DIAGNOSIS — E1169 Type 2 diabetes mellitus with other specified complication: Secondary | ICD-10-CM

## 2023-03-17 DIAGNOSIS — D696 Thrombocytopenia, unspecified: Secondary | ICD-10-CM

## 2023-03-17 DIAGNOSIS — F0150 Vascular dementia without behavioral disturbance: Secondary | ICD-10-CM | POA: Diagnosis not present

## 2023-03-17 LAB — COMPREHENSIVE METABOLIC PANEL
ALT: 17 U/L (ref 0–44)
AST: 23 U/L (ref 15–41)
Albumin: 2.3 g/dL — ABNORMAL LOW (ref 3.5–5.0)
Alkaline Phosphatase: 69 U/L (ref 38–126)
Anion gap: 8 (ref 5–15)
BUN: 17 mg/dL (ref 8–23)
CO2: 23 mmol/L (ref 22–32)
Calcium: 8.4 mg/dL — ABNORMAL LOW (ref 8.9–10.3)
Chloride: 110 mmol/L (ref 98–111)
Creatinine, Ser: 1.44 mg/dL — ABNORMAL HIGH (ref 0.61–1.24)
GFR, Estimated: 50 mL/min — ABNORMAL LOW (ref >60)
Glucose, Bld: 83 mg/dL (ref 70–99)
Potassium: 4 mmol/L (ref 3.5–5.1)
Sodium: 141 mmol/L (ref 135–145)
Total Bilirubin: 0.8 mg/dL (ref 0.0–1.2)
Total Protein: 6.4 g/dL — ABNORMAL LOW (ref 6.5–8.1)

## 2023-03-17 LAB — PROTIME-INR
INR: 1.3 — ABNORMAL HIGH (ref 0.8–1.2)
Prothrombin Time: 16.7 s — ABNORMAL HIGH (ref 11.4–15.2)

## 2023-03-17 MED ORDER — ACETAMINOPHEN 325 MG PO TABS
650.0000 mg | ORAL_TABLET | Freq: Four times a day (QID) | ORAL | Status: AC | PRN
  Administered 2023-03-18 – 2023-03-20 (×2): 650 mg via ORAL
  Filled 2023-03-17 (×2): qty 2

## 2023-03-17 MED ORDER — ENSURE ENLIVE PO LIQD
237.0000 mL | Freq: Two times a day (BID) | ORAL | Status: DC
  Administered 2023-03-17 – 2023-03-24 (×4): 237 mL via ORAL

## 2023-03-17 NOTE — Consult Note (Addendum)
Consultation  Referring Provider: [facility]/ Imogene Burn  Primary Care Physician:  Pcp, No Primary Gastroenterologist:  Dr.Pyrtle  Reason for Consultation: Dysphagia, weight loss, failure to thrive, nausea vomiting  HPI: Brian Burns is a 78 y.o. male, who established GI care at our office in October 2024 and is assigned to Dr. Rhea Belton.  Patient is a nursing home resident with multiple comorbidities including adult onset diabetes mellitus, hypertension, BPH, vascular dementia, prior history of CVA, chronic kidney disease stage III, chronic GERD, atrial fibrillation, seizure disorder.  He is on chronic Eliquis as an outpatient which has been held since admission. Brought to the emergency room on 03/14/2023 due to family concern of ongoing weight loss, poor oral intake, nausea and vomiting, and intermittent choking sensation. When patient was seen in our office in October by Doug Sou, PA-C he had also apparently had a documented heme positive stool and had had 1 episode of rectal bleeding.  He is incontinent. There is also ongoing concern for him bringing up a lot of phlegm and mucus. CT of the abdomen pelvis was obtained and at that time it showed a congenital small bowel malrotation with no evidence of obstruction, there was mild diffuse rectal wall thickening with surrounding soft tissue stranding in the perirectal area concerning for proctitis.  Also noted 3.4 cm infrarenal abdominal aortic aneurysm numerous bladder diverticuli. Plan was for him to have EGD and colonoscopy with Dr. Rhea Belton later this month on 03/25/2023  Patient had been worked up in the past by [facility] for complaints of dysphagia with EGD in April 2022 per Dr. Levora Angel while he was in the hospital with nausea and vomiting.  It showed a significant amount of food in the mid and distal esophagus and decreased motility of the esophageal body was noted, distal esophageal web noted which was TTS dilated and also noted diffuse atrophic  gastritis. Looks as if he was to have a manometry done to rule out achalasia but that was aborted on the day of the procedure.  Barium swallow in April 2022 showed a very limited study demonstrating a severe esophageal motility disorder with extensive tertiary contractions there was a dilated and tortuous esophagus with a large amount of retained food and debris in the proximal to mid esophagus.  Labs on admission show WBC of 5.2/hemoglobin 11.6/hematocrit 37.2/platelets 160 BUN 25/creatinine 1.61 Albumin 2.7 LFTs within normal limits.  Had bedside swallow eval yesterday per speech path.  Notes relay that he has issues triggering a swallow and was recommended for dysphagia 1 diet and is to have a modified swallow study today.  Patient is a poor historian, he denies any abdominal pain, denies any dysphagia, is unaware of any issues with his bowels.  He does admit that he has been spitting up frequently.  Asking for soda No family at bedside.   Past Medical History:  Diagnosis Date   AAA (abdominal aortic aneurysm) without rupture (HCC) 06/2014   no change in last exam from 2015   Achalasia    Acute right hemiparesis (HCC) 05/18/2020   Anxiety    Arthritis    knees,lower back   Atrial fibrillation (HCC)    Atrial flutter with rapid ventricular response (HCC) 01/16/2015   Bilateral wrist pain 09/09/2014   Bladder outlet obstruction 01/20/2015   Bradycardia 08/27/2016   CAD (coronary artery disease)    pt denies   Carotid artery disease (HCC)    Chronic back pain    Chronic kidney disease (CKD), stage III (  moderate) (HCC)    Constipation    Decreased visual acuity of left eye  12/02/2018   Depression    Dysphagia, oropharyngeal phase    Dyspnea    reports today no pregoression   Erectile dysfunction    GERD (gastroesophageal reflux disease)    Headache    hx of   History of CVA (cerebrovascular accident)    2009  &  2013   History of Multiple lacunar infarcts (HCC)  01/31/2015   MRI Brain 01/31/2015: Widespread old lacunar infarcts, particularly right Basal ganglia, pons, and right cerebellum   HLD (hyperlipidemia)    Hypertension    Idiopathic pancreatitis 12/2014   acute   Knee pain, bilateral 04/09/2011   Malnutrition of moderate degree 01/25/2015   Memory impairment    mild dementia per PCP progress note    Morbid obesity (HCC) 12/03/2018   Other abnormalities of gait and mobility 12/03/2018   PVD (peripheral vascular disease) with claudication (HCC)    Type 2 diabetes mellitus (HCC)    Urethral stricture 02/16/2013   Urge incontinence 12/08/2018   Vascular dementia [facility])     Past Surgical History:  Procedure Laterality Date   back injections     [facility]   BALLOON DILATION N/A 12/16/2018   Procedure: URETHRAL BALLOON DILATION;  Surgeon: Crist Fat, MD;  Location: [facility];  Service: Urology;  Laterality: N/A;   BIOPSY  05/22/2020   Procedure: BIOPSY;  Surgeon: [name], MD;  Location: [facility];  Service: Gastroenterology;;   CARDIOVASCULAR STRESS TEST  08-09-2009   mild global hypokinesis,  no ischemia or scar/  ef 41%   CATARACT EXTRACTION W/ INTRAOCULAR LENS  IMPLANT, BILATERAL  april and may 2014   CYSTOSCOPY N/A 01/17/2017   Procedure: CYSTOSCOPY FLEXIBLE;  Surgeon: Crist Fat, MD;  Location: [facility];  Service: Urology;  Laterality: N/A;   CYSTOSCOPY WITH RETROGRADE URETHROGRAM N/A 11/24/2014   Procedure: CYSTOSCOPY WITH RETROGRADE URETHROGRAM;  Surgeon: Crist Fat, MD;  [facility];  Service: Urology;  Laterality: N/A;   CYSTOSCOPY WITH RETROGRADE URETHROGRAM N/A 12/16/2018   Procedure: CYSTOSCOPY WITH RETROGRADE URETHROGRAM;  Surgeon: Crist Fat, MD;  Location: [facility];  Service: Urology;  Laterality: N/A;   CYSTOSCOPY WITH RETROGRADE URETHROGRAM N/A 12/16/2019   Procedure: CYSTOSCOPY WITH RETROGRADE, Direct Vision Internal Urethrotomy;  Surgeon:  Crist Fat, MD;  Location: [facility];  Service: Urology;  Laterality: N/A;   CYSTOSCOPY WITH URETHRAL DILATATION N/A 11/24/2014   Procedure: CYSTOSCOPY WITH URETHRAL DILATATION;  Surgeon: Crist Fat, MD;  Location[facility];  Service: Urology;  Laterality: N/A;  BALLOON DILATION         ESOPHAGEAL DILATION  05/22/2020   Procedure: ESOPHAGEAL DILATION;  Surgeon: [name], MD;  Location: [facility];  Service: Gastroenterology;;   ESOPHAGOGASTRODUODENOSCOPY N/A 05/15/2014   Procedure: ESOPHAGOGASTRODUODENOSCOPY (EGD);  Surgeon: Carman Ching, MD;  Location: [facility];  Service: Endoscopy;  Laterality: N/A;   ESOPHAGOGASTRODUODENOSCOPY Left 02/02/2015   Procedure: ESOPHAGOGASTRODUODENOSCOPY (EGD);  Surgeon: Dorena Cookey, MD;  Location: [facility];  Service: Endoscopy;  Laterality: Left;   ESOPHAGOGASTRODUODENOSCOPY Left 03/16/2015   Procedure: ESOPHAGOGASTRODUODENOSCOPY (EGD);  Surgeon: Dorena Cookey, MD;  Location: [facility];  Service: Endoscopy;  Laterality: Left;   ESOPHAGOGASTRODUODENOSCOPY (EGD) WITH PROPOFOL N/A 05/22/2020   Procedure: ESOPHAGOGASTRODUODENOSCOPY (EGD) WITH PROPOFOL;  Surgeon: [name], MD;  Location: [facility];  Service: Gastroenterology;  Laterality: N/A;   FOREIGN BODY REMOVAL  05/22/2020  Procedure: FOREIGN BODY REMOVAL;  Surgeon: [name], MD;  Location: [facility];  Service: Gastroenterology;;   TRANSTHORACIC ECHOCARDIOGRAM  01-07-2012   mild to moderate dilated LV,  ef 45-50%,  mild basal hypokinesis,  grade I diastolic  dysfunction/  trivial AR/  mild MR   URETHROPLASTY N/A 01/17/2017   Procedure: URETHEROPLASTY BUCCAL GRAFT AND BUCCAL MUCOSA GRAFT HARVEST;  Surgeon: Crist Fat, MD;  Location: [facility];  Service: Urology;  Laterality: N/A;    Prior to Admission medications   Medication Sig Start Date End Date Taking? Authorizing Provider  acetaminophen (TYLENOL) 325 MG tablet Take 650 mg by  mouth every 4 (four) hours as needed for mild pain (pain score 1-3) (CANNOT EXCEED A SUM TOTAL OF 3,000 MG FROM ALL COMBINED SOURCES/24 HOURS).   Yes [provider]  apixaban (ELIQUIS) 5 MG TABS tablet Take 5 mg by mouth 2 (two) times daily.   Yes [provider]  atorvastatin (LIPITOR) 80 MG tablet Take 80 mg by mouth at bedtime.   Yes [provider]  bisacodyl (DULCOLAX) 10 MG suppository Place 10 mg rectally daily as needed (for constipation).   Yes [provider]  buPROPion (WELLBUTRIN) 75 MG tablet Take 75 mg by mouth every morning. 11/01/22  Yes [provider]  cetirizine (ZYRTEC) 10 MG tablet Take 10 mg by mouth daily.   Yes [provider]  cholecalciferol (VITAMIN D3) 25 MCG (1000 UNIT) tablet Take 2,000 Units by mouth daily.   Yes [provider]  docusate sodium (COLACE) 100 MG capsule Take 100 mg by mouth 2 (two) times daily.   Yes [provider]  donepezil (ARICEPT) 5 MG tablet Take 5 mg by mouth in the morning.   Yes [provider]  famotidine (PEPCID) 20 MG tablet Take 20 mg by mouth 2 (two) times daily before a meal.   Yes [provider]  feeding supplement, GLUCERNA SHAKE, (GLUCERNA SHAKE) LIQD Take 237 mLs by mouth 4 (four) times daily.   Yes [provider]  Ferrous Fumarate 324 MG TABS Take 324 mg by mouth daily with breakfast.   Yes [provider]  finasteride (PROSCAR) 5 MG tablet Take 5 mg by mouth at bedtime.   Yes [provider]  lipase/protease/amylase (CREON) 12000-38000 units CPEP capsule Take 12,000 Units by mouth 3 (three) times daily before meals.   Yes [provider]  melatonin 3 MG TABS tablet Take 3 mg by mouth at bedtime.   Yes [provider]  Multiple Vitamin (MULTIVITAMIN) tablet Take 1 tablet by mouth daily with breakfast.   Yes [provider]  pantoprazole (PROTONIX) 40 MG tablet Take 40 mg by mouth 2 (two)  times daily.   Yes [provider]  polyethylene glycol (MIRALAX / GLYCOLAX) 17 g packet Take 17 g by mouth daily as needed. Patient taking differently: Take 17 g by mouth daily as needed (for suppository). 05/23/20  Yes Valetta Close, MD  potassium chloride (KLOR-CON) 10 MEQ tablet Take 1 tablet (10 mEq total) by mouth daily. 04/05/20  Yes Caccavale, Sophia, PA-C  pregabalin (LYRICA) 75 MG capsule Take 75 mg by mouth in the morning and at bedtime. 09/04/20  Yes [provider]  senna (SENOKOT) 8.6 MG tablet Take 2 tablets by mouth See admin instructions. Take 2 tablets by mouth twice a day and hold for constipation   Yes [provider]  sodium phosphate (FLEET) ENEM Place 1 enema rectally daily as needed (for  constipation not relieved by Dulcolax).   Yes [provider]  traMADol (ULTRAM) 50 MG tablet Take 1 tablet (50 mg total) by mouth 3 (three) times daily as needed for moderate pain. Patient not taking: Reported on 03/14/2023 01/31/20   Genice Rouge, MD    Current Facility-Administered Medications  Medication Dose Route Frequency Provider Last Rate Last Admin   bisacodyl (DULCOLAX) suppository 10 mg  10 mg Rectal Daily PRN Sunnie Nielsen, DO       feeding supplement (ENSURE ENLIVE / ENSURE PLUS) liquid 237 mL  237 mL Oral BID BM Carollee Herter, DO       heparin injection 5,000 Units  5,000 Units Subcutaneous Q8H Sunnie Nielsen, DO   5,000 Units at 03/17/23 6213   morphine (PF) 2 MG/ML injection 2 mg  2 mg Intravenous Q2H PRN Rometta Emery, MD       ondansetron (ZOFRAN) tablet 4 mg  4 mg Oral Q6H PRN Rometta Emery, MD       Or   ondansetron (ZOFRAN) injection 4 mg  4 mg Intravenous Q6H PRN Rometta Emery, MD   4 mg at 03/17/23 0506   pantoprazole (PROTONIX) EC tablet 40 mg  40 mg Oral Daily Carollee Herter, DO   40 mg at 03/16/23 1555   sodium phosphate (FLEET) enema 1 enema  1 enema Rectal Daily PRN Sunnie Nielsen, DO        Allergies as of  03/14/2023 - Review Complete 03/14/2023  Allergen Reaction Noted   Gabapentin Other (See Comments) 12/08/2018    Family History  Problem Relation Age of Onset   Lung disease Brother    Kidney disease Brother    Heart disease Brother        died at 62   Heart disease Mother        died at 32   Kidney disease Mother    Lung disease Mother     Social History   Socioeconomic History   Marital status: Married    Spouse name: Karmelo Bass   Number of children: 2    Years of education: 7   Highest education level: 7th grade  Occupational History   Occupation:  Public affairs consultant at [employer]    Comment: during school year   Occupation: [employer]    Comment: on the weekends  Tobacco Use   Smoking status: Former    Current packs/day: 0.00    Average packs/day: 0.5 packs/day for 40.0 years (20.0 ttl pk-yrs)    Types: Cigarettes    Start date: 01/29/1959    Quit date: 01/29/1999    Years since quitting: 24.1   Smokeless tobacco: Never  Vaping Use   Vaping status: Never Used  Substance and Sexual Activity   Alcohol use: Never    Alcohol/week: 0.0 standard drinks of alcohol   Drug use: Never   Sexual activity: Not Currently  Other Topics Concern   Not on file  Social History Narrative   Mr Moore lives with his wife, Garen Woolbright, in a one story, single family home dwelling with 4 steps into the home.    The Fielding's receive Food & Nutrition Benefits and Social Security Income   Mr JATORIAN RENAULT has given permission to share his private health information with his daughter, [name] ([phone]) 12/03/18   [name] is the agent for [name] Power of [name]- The document was signed by patient and Notarized in [facility] office on 12/03/18.    Ms  Samule Ohm manages her father's finances   Social Drivers of Health   Financial Resource Strain: Low Risk  (06/17/2018)   Overall Financial Resource Strain (CARDIA)    Difficulty of Paying Living Expenses: Not very hard  Food  Insecurity: No Food Insecurity (03/14/2023)   Hunger Vital Sign    Worried About Running Out of Food in the Last Year: Never true    Ran Out of Food in the Last Year: Never true  Transportation Needs: No Transportation Needs (03/14/2023)   PRAPARE - Administrator, Civil Service (Medical): No    Lack of Transportation (Non-Medical): No  Physical Activity: Not on file  Stress: Not on file  Social Connections: Moderately Integrated (03/14/2023)   Social Connection and Isolation Panel [NHANES]    Frequency of Communication with Friends and Family: More than three times a week    Frequency of Social Gatherings with Friends and Family: More than three times a week    Attends Religious Services: 1 to 4 times per year    Active Member of [employer] or Organizations: No    Attends Banker Meetings: Never    Marital Status: Married  Catering manager Violence: Not At Risk (03/14/2023)   Humiliation, Afraid, Rape, and Kick questionnaire    Fear of Current or Ex-Partner: No    Emotionally Abused: No    Physically Abused: No    Sexually Abused: No    Review of Systems: Pertinent positive and negative review of systems were noted in the above HPI section.  All other review of systems was otherwise negative.   Physical Exam: Vital signs in last 24 hours: Temp:  [97.4 F (36.3 C)-98.4 F (36.9 C)] 97.9 F (36.6 C) (02/17 0800) Pulse Rate:  [62-69] 62 (02/17 0800) Resp:  [16-18] 17 (02/17 0800) BP: (102-112)/(75-91) 105/75 (02/17 0800) SpO2:  [98 %-100 %] 99 % (02/17 0800)   General:   Alert,  Well-developed, well-nourished elderly African-American male, pleasant and cooperative in NAD Head:  Normocephalic and atraumatic. Eyes:  Sclera clear, no icterus.   Conjunctiva pink. Ears:  Normal auditory acuity. Nose:  No deformity, discharge,  or lesions. Mouth:  No deformity or lesions.   Neck:  Supple; no masses or thyromegaly. Lungs:   Clear throughout to auscultation.   No  wheezes, crackles, or rhonchi. Heart:  Regular rate and rhythm; no murmurs, clicks, rubs,  or gallops. Abdomen:  Soft,nontender, BS active,nonpalp mass or hsm.   Rectal: Not done Msk:  Symmetrical without gross deformities. . Pulses:  Normal pulses noted. Extremities:  Without clubbing or edema. Neurologic:  Alert and  oriented x1, grossly normal neurologically. Skin:  Intact without significant lesions or rashes.. Psych:  Alert and cooperative. Normal mood and affect.  Intake/Output from previous day: 02/16 0701 - 02/17 0700 In: 1840.9 [P.O.:120; I.V.:1720.9] Out: 1125 [Urine:1125] Intake/Output this shift: No intake/output data recorded.  Lab Results: Recent Labs    03/14/23 1636 03/15/23 1109 03/16/23 0947  WBC 5.2 5.3 3.7*  HGB 11.6* 10.2* 9.5*  HCT 37.2* 33.2* 32.5*  PLT 160 133* 106*   BMET Recent Labs    03/15/23 1109 03/16/23 0947 03/17/23 0448  NA 139 141 141  K 3.6 4.2 4.0  CL 106 111 110  CO2 25 23 23   GLUCOSE 109* 99 83  BUN 25* 18 17  CREATININE 1.61* 1.50* 1.44*  CALCIUM 8.3* 8.2* 8.4*   LFT Recent Labs    03/17/23 0448  PROT 6.4*  ALBUMIN 2.3*  AST 23  ALT 17  ALKPHOS 69  BILITOT 0.8   PT/INR No results for input(s): "LABPROT", "INR" in the last 72 hours. Hepatitis Panel No results for input(s): "HEPBSAG", "HCVAB", "HEPAIGM", "HEPBIGM" in the last 72 hours.  IMPRESSION:  #81 78 year old African-American male, nursing home resident with multiple comorbidities, brought to the emergency room with complaints of nausea and vomiting, recent poor oral intake and intermittent choking episodes, weight loss  Unclear to me if he is actually having nausea and vomiting or if he is regurgitating retained material from his esophagus. He has had a drop in all of his counts since admission, question underlying viral syndrome for his acute nausea and vomiting  #2 component of oral dysphagia-patient is for modified swallowing study today with speech  path  #3 previously documented severe esophageal dysmotility with numerous tertiary contractions and a dilated tortuous esophagus. Definite diagnosis of achalasia, it looks like he was scheduled to have manometry in 2022 but this was aborted, nonetheless he has severe dysmotility with retained food in his esophagus at prior EGD.  #4 concern for proctitis on CT from October 2024 at which time he had been referred to our office for heme positive stool. Plan was for colonoscopy later this month  #5 vascular dementia significant For 6 chronic kidney disease stage III #6 seizure disorder #7 coronary artery disease  #8 BPH #9 diabetes mellitus  Plan; Await modified swallowing study with speech path today I think you will benefit from an endoscopy this admission to see if he has retained food in his esophagus, and also had a prior esophageal web which was dilated and that can be reevaluated as well. He would also benefit from an esophageal manometry though there is are generally scheduled very far out, and may not change our management. I am not sure that he will take a bowel prep but if we are contemplating EGD later this week then could attempt to prep him for colonoscopy at that same setting Continue to hold Eliquis Upright positioning for all meals. GI will follow with you  Addendum; modified barium swallow study today with severely delayed swallow with all boluses with observed several incidences of boluses starting to transit through the PES before the swallow was initiated, aspirated once with thin liquids and penetrated once with nectar thick liquids.  There was incidence of retrograde movement of barium back up into the pharynx , to be high risk for aspiration with all p.o.'s  , With failing the swallowing study is not going to be able to do a bowel prep will discuss further with Dr. Chales Abrahams but would probably aim just to do EGD, perhaps tomorrow then will need to discuss further with his  daughter regarding goals of care based on findings at EGD and whether there is anything treatable at EGD.  Amy EsterwoodPA-C  03/17/2023, 10:59 AM   Attending physician's note   I have taken history, reviewed the chart and examined the patient. I performed a substantive portion of this encounter, including complete performance of at least one of the key components, in conjunction with the APP. I agree with the Advanced Practitioner's note, impression and recommendations.   78yr old NH resident with A fib on eliquis (last dose 2/14), vascular dementia (able to ans questions), CAD, CKD3, SZ D/O, DM2  With N/V/dysphagia- failed FEES/MBS with ?distal eso lesion/obs. He has prev severe eso dysmotility, eso web s/p dil 04/2020 with dilated torturous eso.  No definite Dx of achalasia as he could  not tolerate eso manometry.  Also CT Abdo/pelvis Oct 2024 showing ?Proctitis. Had H+ stools without any active bleeding. Hb10.  There were plans to undergo colonoscopy later this month.  Plan: -IV protonix -EGD in AM with dil (if needed) -Can't tolerate prep for colon @ this time d/t risks of aspiration (failed MBS) -Agree to hold Eliquis for now -FU with Dr Pyrtle/APP as outpt.   Edman Circle, MD [facility] [phone]

## 2023-03-17 NOTE — Plan of Care (Signed)

## 2023-03-17 NOTE — Assessment & Plan Note (Signed)
03-17-2023 chronic.

## 2023-03-17 NOTE — Progress Notes (Signed)
PROGRESS NOTE    Brian Burns  ZOX:[id number] DOB: 1945/04/09 DOA: 03/14/2023 PCP: Pcp, No  Subjective: Pt seen and examined.  No complaints. Going to MBS today.  GI consult reviewed.they are planning EGD during this admission +/- colonoscopy.   Hospital Course: HPI/Hospital Course: Brian Burns is a 78 y.o. male with medical history significant of type 2 diabetes, essential hypertension BPH, vascular dementia, chronic kidney disease stage III, GERD, paroxysmal atrial fibrillation, coronary artery disease, seizure disorder, bilateral hearing loss, esophageal dysphagia among other things he is a resident of skilled nursing facility and has recently been having poor oral intake nausea and vomiting.  Patient has also exhibited some choking sensation when he ate.  He has been losing weight lately.  Patient will be seen by GI in the outpatient setting with plan to do EGD colonoscopy.  Recent CT scan has shown proctitis.  Workup in the process.  Patient also had poor baseline with failure to thrive.  Literally bedridden most of the time.  In the ER he was found to have evidence of dehydration.  He is still vomiting in the ER.  Creatinine has increased from his baseline of 1.6-2.2.  Also BUN elevated.  Being admitted with intractable nausea vomiting and AKI   02/14: In the ER he was found to have evidence of dehydration.  He was still vomiting in the ER.  Creatinine increased from his baseline of 1.6--> 2.2.  Also BUN elevated.  Was admitted with intractable nausea vomiting and AKI  02/15: Cr back at baseline. Still nausea w/ dry heaves but pt is requesting Pepsi, has no other complaints or concerns but is not oriented. See belwo for discussion w/ daughter - will get CT c/a/p and ask GI to see him pending this   Significant Events: Admitted 03/14/2023 for intractable N/V   Significant Labs: WBC 5.2, HgB 11.6, plt 160 Na 137, K 4.3, CO2 of 24, BUN 31, scr 2.02  Significant Imaging  Studies: CXR/KUB shows No acute cardiopulmonary disease. 2. Nonobstructive bowel gas pattern. 3. Stool throughout the colon-correlate for constipation. 4. Similar-appearing widened mediastinum consistent with known underlying esophageal dilatation in tortuosity  Antibiotic Therapy: Anti-infectives (From admission, onward)    None       Procedures:   Consultants: GI      Assessment and Plan: * Acute kidney injury superimposed on stage 3a chronic kidney disease (HCC) - baseline SCr 1.6 03-16-2023 continue with IVF overnight. Monitor Scr in AM. Scr 1.5 today  03-17-2023 stable. Scr 1.44  Oral phase dysphagia 03-16-2023 seen by ST today. MBS tomorrow. On dysphagia-1 diet(mechanical soft diet) for now pending MBS tomorrow. 03-17-2023 MBS today. Thanks for GI consult. They are planning EGD this admission.  Dementia, vascular (HCC) 03-16-2023 chronic. Attending yesterday talked with pt's dtr [name]. Pt with known dementia and getting worse. Dtr is not interested in PEG tube. 03-17-2023 chronic.  Nausea 03-16-2023 resolved. Prn zofran.  History of anemia due to CKD 03-17-2023 chronic.  DNR (do not resuscitate)/DNI(Do Not intubate) 03-16-2023 pt made DNR on arrival.  Seizure [facility]) 03-16-2023 stable. Wasn't taking any seizure meds. 03-17-2023 stable  Coronary artery disease involving native coronary artery of native heart without angina pectoris 03-16-2023 stable. 03-17-2023 stable  Thrombocytopenia (HCC) 03-17-2023 plt count falling some. Down to 106K.  Stop SQ heparin.  Paroxysmal atrial fibrillation (HCC) 03-16-2023 holding eliquis for now. 03-17-2023 stable. Continue to hold Eliquis.  CKD stage 3a, GFR 45-59 ml/min (HCC) - baseline scr 1.6 03-16-2023 stable.  03-17-2023 stable  GERD (gastroesophageal reflux disease) 03-16-2023 change to po protonix. 03-17-2023 stable  BPH (benign prostatic hyperplasia) 03-16-2023 continue proscar 03-17-2023  stable.  Hypertension associated with diabetes (HCC) 03-16-2023 BP stable off any HTN meds. 03-17-2023   Type 2 diabetes mellitus with hypercholesterolemia (HCC) 03-16-2023 due to poor po intake. Not on any SSI. 03-17-2023 continue SSI.   DVT prophylaxis: SCD    Code Status: Limited: Do not attempt resuscitation (DNR) -DNR-LIMITED -Do Not Intubate/DNI  Family Communication: no family at bedside Disposition Plan: return to SNF Reason for continuing need for hospitalization: MBS today. GI planning on EGD this admission.  Objective: Vitals:   03/16/23 1203 03/16/23 2035 03/17/23 0453 03/17/23 0800  BP: 102/75 (!) 112/91 106/75 105/75  Pulse: 62 69 62 62  Resp: 18 16 16 17   Temp: (!) 97.4 F (36.3 C) 97.7 F (36.5 C) 98.4 F (36.9 C) 97.9 F (36.6 C)  TempSrc: Oral Axillary Oral Oral  SpO2: 100% 98% 100% 99%  Weight:      Height:        Intake/Output Summary (Last 24 hours) at 03/17/2023 1215 Last data filed at 03/17/2023 0600 Gross per 24 hour  Intake 1840.93 ml  Output 1125 ml  Net 715.93 ml   Filed Weights   03/14/23 1542  Weight: 69.2 kg    Examination:  Physical Exam Vitals and nursing note reviewed.  Constitutional:      General: He is not in acute distress.    Appearance: He is not toxic-appearing.  HENT:     Head: Normocephalic and atraumatic.  Eyes:     General: No scleral icterus. Cardiovascular:     Rate and Rhythm: Normal rate and regular rhythm.  Pulmonary:     Effort: Pulmonary effort is normal.  Abdominal:     General: Abdomen is flat. Bowel sounds are normal.  Musculoskeletal:     Right lower leg: No edema.     Left lower leg: No edema.  Skin:    General: Skin is warm and dry.     Capillary Refill: Capillary refill takes less than 2 seconds.  Neurological:     Mental Status: He is disoriented.     Data Reviewed: I have personally reviewed following labs and imaging studies  CBC: Recent Labs  Lab 03/14/23 1636 03/15/23 1109  03/16/23 0947  WBC 5.2 5.3 3.7*  NEUTROABS 3.3  --   --   HGB 11.6* 10.2* 9.5*  HCT 37.2* 33.2* 32.5*  MCV 93.0 93.5 98.2  PLT 160 133* 106*   Basic Metabolic Panel: Recent Labs  Lab 03/14/23 1636 03/15/23 1109 03/16/23 0947 03/17/23 0448  NA 137 139 141 141  K 4.3 3.6 4.2 4.0  CL 103 106 111 110  CO2 24 25 23 23   GLUCOSE 118* 109* 99 83  BUN 31* 25* 18 17  CREATININE 2.02* 1.61* 1.50* 1.44*  CALCIUM 8.9 8.3* 8.2* 8.4*   GFR: Estimated Creatinine Clearance: 42.7 mL/min (A) (by C-G formula based on SCr of 1.44 mg/dL (H)). Liver Function Tests: Recent Labs  Lab 03/14/23 1636 03/15/23 1109 03/17/23 0448  AST 27 25 23   ALT 19 17 17   ALKPHOS 89 76 69  BILITOT 1.0 0.7 0.8  PROT 7.9 6.8 6.4*  ALBUMIN 3.1* 2.7* 2.3*   Scheduled Meds:  feeding supplement  237 mL Oral BID BM   pantoprazole  40 mg Oral Daily   Continuous Infusions:   LOS: 3 days   Time spent: 40 minutes  Carollee Herter, DO  [facility]  03/17/2023, 12:15 PM

## 2023-03-17 NOTE — Assessment & Plan Note (Signed)
03-17-2023 plt count falling some. Down to 106K.  Stop SQ heparin. 03-18-2023 repeat CBC in AM.

## 2023-03-17 NOTE — Procedures (Signed)
Modified Barium Swallow Study  Patient Details  Name: Brian Burns MRN: [id number] Date of Birth: 1945/12/20  Today's Date: 03/17/2023  Modified Barium Swallow completed.  Full report located under Chart Review in the Imaging Section.  History of Present Illness Brian Burns is a 78 y.o. male with medical history significant of type 2 diabetes, essential hypertension BPH, vascular dementia, chronic kidney disease stage III, GERD, paroxysmal atrial fibrillation, coronary artery disease, seizure disorder, bilateral hearing loss, esophageal dysphagia among other things he is a resident of skilled nursing facility and has recently been having poor oral intake nausea and vomiting.  Patient has also exhibited some choking sensation when he ate.  He has been losing weight lately.  Chest xray was showing no acute cardiopulmonary disease. He is known to ST service with MBS completed on 05/19/2020 with concern for possible esophageal dysphagia.   Clinical Impression Patient is presenting with a mild-moderate oral dysphagia and a mod-severe pharyngoesophageal dysphagia as per this MBS. Patient exhibited delays to pyrifom sinus with all tested barium consistencies (thin, nectar thick and honey thick liquids, puree solids). There were several instances of boulses starting to pass through PES before swallow was initiated. SLP had to cue patient to swallow frequently during this study. He exhibited one instance of sensed aspiration of thin liquids just below vocal cords. After swallows were finally initiated, he did exhibit clearance of majority of boluses with only trace to mild diffuse pharyngeal residuals s/p initial swallow. Towards the end of the study, retrograde movement of large amount of barium from esophagus and up through PES into pharynx (reaching as high as vallecular sinus) was observed. Deep penetration of this refluxed barium was observed. Esophageal sweep images were not clear but there did  appear to be stasis of barium in esophagus. SLP is recommending to continue with current PO diet of Dys 1 (puree) solids and thin liquids  and to follow strict aspiration precautions. GI is planning for EGD with possible dilation during this admission. Factors that may increase risk of adverse event in presence of aspiration Brian Burns & Clearance Brian Burns 2021): Frail or deconditioned;Respiratory or GI disease  Swallow Evaluation Recommendations Recommendations: PO diet PO Diet Recommendation: Dysphagia 1 (Pureed);Thin liquids (Level 0) Liquid Administration via: Cup Medication Administration: Crushed with puree Supervision: Full assist for feeding;Full supervision/cueing for swallowing strategies Swallowing strategies  : Slow rate;Small bites/sips Postural changes: Stay upright 30-60 min after meals;Position pt fully upright for meals Oral care recommendations: Oral care BID (2x/day)  Angela Nevin, MA, CCC-SLP Speech Therapy

## 2023-03-18 DIAGNOSIS — R1311 Dysphagia, oral phase: Secondary | ICD-10-CM | POA: Diagnosis not present

## 2023-03-18 DIAGNOSIS — N179 Acute kidney failure, unspecified: Secondary | ICD-10-CM | POA: Diagnosis not present

## 2023-03-18 DIAGNOSIS — F0150 Vascular dementia without behavioral disturbance: Secondary | ICD-10-CM | POA: Diagnosis not present

## 2023-03-18 DIAGNOSIS — R634 Abnormal weight loss: Secondary | ICD-10-CM | POA: Diagnosis not present

## 2023-03-18 DIAGNOSIS — N1831 Chronic kidney disease, stage 3a: Secondary | ICD-10-CM | POA: Diagnosis not present

## 2023-03-18 DIAGNOSIS — R627 Adult failure to thrive: Secondary | ICD-10-CM | POA: Diagnosis not present

## 2023-03-18 DIAGNOSIS — R1310 Dysphagia, unspecified: Secondary | ICD-10-CM | POA: Diagnosis not present

## 2023-03-18 DIAGNOSIS — R112 Nausea with vomiting, unspecified: Secondary | ICD-10-CM | POA: Diagnosis not present

## 2023-03-18 NOTE — Plan of Care (Signed)

## 2023-03-18 NOTE — Progress Notes (Signed)
PROGRESS NOTE    Brian Burns  ZOX:[id number] DOB: Nov 26, 1945 DOA: 03/14/2023 PCP: Pcp, No  Subjective: Pt seen and examined.  MBS results in chart.  mild-moderate oral dysphagia and a mod-severe pharyngoesophageal dysphagia   Was suppose to be NPO for EGD today. NPO got canceled so EGD canceled. NPO after MN for EGD tomorrow.   Hospital Course: HPI/Hospital Course: Brian Burns is a 78 y.o. male with medical history significant of type 2 diabetes, essential hypertension BPH, vascular dementia, chronic kidney disease stage III, GERD, paroxysmal atrial fibrillation, coronary artery disease, seizure disorder, bilateral hearing loss, esophageal dysphagia among other things he is a resident of skilled nursing facility and has recently been having poor oral intake nausea and vomiting.  Patient has also exhibited some choking sensation when he ate.  He has been losing weight lately.  Patient will be seen by GI in the outpatient setting with plan to do EGD colonoscopy.  Recent CT scan has shown proctitis.  Workup in the process.  Patient also had poor baseline with failure to thrive.  Literally bedridden most of the time.  In the ER he was found to have evidence of dehydration.  He is still vomiting in the ER.  Creatinine has increased from his baseline of 1.6-2.2.  Also BUN elevated.  Being admitted with intractable nausea vomiting and AKI   02/14: In the ER he was found to have evidence of dehydration.  He was still vomiting in the ER.  Creatinine increased from his baseline of 1.6--> 2.2.  Also BUN elevated.  Was admitted with intractable nausea vomiting and AKI  02/15: Cr back at baseline. Still nausea w/ dry heaves but pt is requesting Pepsi, has no other complaints or concerns but is not oriented. See belwo for discussion w/ daughter - will get CT c/a/p and ask GI to see him pending this   Significant Events: Admitted 03/14/2023 for intractable N/V   Significant Labs: WBC 5.2, HgB 11.6,  plt 160 Na 137, K 4.3, CO2 of 24, BUN 31, scr 2.02  Significant Imaging Studies: CXR/KUB shows No acute cardiopulmonary disease. 2. Nonobstructive bowel gas pattern. 3. Stool throughout the colon-correlate for constipation. 4. Similar-appearing widened mediastinum consistent with known underlying esophageal dilatation in tortuosity  Antibiotic Therapy: Anti-infectives (From admission, onward)    None       Procedures:   Consultants: GI      Assessment and Plan: * Acute kidney injury superimposed on stage 3a chronic kidney disease (HCC) - baseline SCr 1.6 03-16-2023 continue with IVF overnight. Monitor Scr in AM. Scr 1.5 today 03-17-2023 stable. Scr 1.44  03-18-2023 resolved AKI.  Oral phase dysphagia 03-16-2023 seen by ST today. MBS tomorrow. On dysphagia-1 diet(mechanical soft diet) for now pending MBS tomorrow. 03-17-2023 MBS today. Thanks for GI consult. They are planning EGD this admission.  03-18-2023 EGD in AM. Discussed with dtr [name] that pt would not benefit from PEG.  Dementia, vascular (HCC) 03-16-2023 chronic. Attending yesterday talked with pt's dtr [name]. Pt with known dementia and getting worse. Dtr is not interested in PEG tube. 03-17-2023 chronic. 03-18-2023 chronic/stable. Reiterated to dtr kathy that PEG in pt's with dementia would not prolong his life. That risk of him pulling it over is not worth benefit.  Nausea 03-16-2023 resolved. Prn zofran.  History of anemia due to CKD 03-17-2023 chronic.  DNR (do not resuscitate)/DNI(Do Not intubate) 03-16-2023 pt made DNR on arrival.  Seizure [facility]) 03-16-2023 stable. Wasn't taking any seizure meds. 03-17-2023  stable 03-18-2023 stable.  Coronary artery disease involving native coronary artery of native heart without angina pectoris 03-16-2023 stable. 03-17-2023 stable 03-18-2023 stable.  Thrombocytopenia (HCC) 03-17-2023 plt count falling some. Down to 106K.  Stop SQ heparin. 03-18-2023  repeat CBC in AM.  Paroxysmal atrial fibrillation (HCC) 03-16-2023 holding eliquis for now. 03-17-2023 stable. Continue to hold Eliquis. 03-18-2023 stable.  CKD stage 3a, GFR 45-59 ml/min (HCC) - baseline scr 1.6 03-16-2023 stable. 03-17-2023 stable 03-18-2023 stable.  GERD (gastroesophageal reflux disease) 03-16-2023 change to po protonix. 03-17-2023 stable 03-18-2023 stable.  BPH (benign prostatic hyperplasia) 03-16-2023 continue proscar 03-17-2023 stable. 03-18-2023 stable.  Hypertension associated with diabetes (HCC) 03-16-2023 BP stable off any HTN meds. 03-18-2023 stable off HTN meds.   Type 2 diabetes mellitus with hypercholesterolemia (HCC) 03-16-2023 due to poor po intake. Not on any SSI. 03-17-2023 continue SSI. 03-18-2023 stable   DVT prophylaxis:  SCDs    Code Status: Limited: Do not attempt resuscitation (DNR) -DNR-LIMITED -Do Not Intubate/DNI  Family Communication: discussed with pt's dtr kathy downey via phone. Probably DC back to SNF on Thursday or Friday. She is agreeable Disposition Plan: return to SNF Reason for continuing need for hospitalization: needs EGD tomorrow.  Objective: Vitals:   03/17/23 1520 03/17/23 1929 03/18/23 0355 03/18/23 1301  BP: 107/76 111/75 103/63 123/77  Pulse: 65 61 (!) 55 (!) 56  Resp: 18  16 19   Temp: 97.7 F (36.5 C) 97.6 F (36.4 C) 98.2 F (36.8 C)   TempSrc: Oral Oral Oral   SpO2: 99% 100% 99% 98%  Weight:      Height:        Intake/Output Summary (Last 24 hours) at 03/18/2023 1314 Last data filed at 03/18/2023 0617 Gross per 24 hour  Intake 240 ml  Output 400 ml  Net -160 ml   Filed Weights   03/14/23 1542  Weight: 69.2 kg    Examination:  Physical Exam Vitals and nursing note reviewed.  Constitutional:      General: He is not in acute distress.    Appearance: He is not toxic-appearing or diaphoretic.  HENT:     Head: Normocephalic and atraumatic.     Nose: Nose normal.  Cardiovascular:      Rate and Rhythm: Normal rate and regular rhythm.  Pulmonary:     Effort: Pulmonary effort is normal.  Abdominal:     General: Bowel sounds are normal. There is no distension.     Palpations: Abdomen is soft.  Musculoskeletal:     Right lower leg: No edema.     Left lower leg: No edema.  Skin:    General: Skin is warm and dry.     Capillary Refill: Capillary refill takes less than 2 seconds.  Neurological:     Comments: Pleasantly demented. Keeps asking for a Pepsi.   Data Reviewed: I have personally reviewed following labs and imaging studies  CBC: Recent Labs  Lab 03/14/23 1636 03/15/23 1109 03/16/23 0947  WBC 5.2 5.3 3.7*  NEUTROABS 3.3  --   --   HGB 11.6* 10.2* 9.5*  HCT 37.2* 33.2* 32.5*  MCV 93.0 93.5 98.2  PLT 160 133* 106*   Basic Metabolic Panel: Recent Labs  Lab 03/14/23 1636 03/15/23 1109 03/16/23 0947 03/17/23 0448  NA 137 139 141 141  K 4.3 3.6 4.2 4.0  CL 103 106 111 110  CO2 24 25 23 23   GLUCOSE 118* 109* 99 83  BUN 31* 25* 18 17  CREATININE 2.02* 1.61* 1.50*  1.44*  CALCIUM 8.9 8.3* 8.2* 8.4*   GFR: Estimated Creatinine Clearance: 42.7 mL/min (A) (by C-G formula based on SCr of 1.44 mg/dL (H)). Liver Function Tests: Recent Labs  Lab 03/14/23 1636 03/15/23 1109 03/17/23 0448  AST 27 25 23   ALT 19 17 17   ALKPHOS 89 76 69  BILITOT 1.0 0.7 0.8  PROT 7.9 6.8 6.4*  ALBUMIN 3.1* 2.7* 2.3*   Coagulation Profile: Recent Labs  Lab 03/17/23 1409  INR 1.3*   Radiology Studies: DG Swallowing Func-Speech Pathology Result Date: 03/17/2023 Table formatting from the original result was not included. Modified Barium Swallow Study Patient Details Name: Brian Burns MRN: [id number] Date of Birth: 10-Sep-1945 Today's Date: 03/17/2023 HPI/PMH: HPI: ERIAN ROSENGREN is a 78 y.o. male with medical history significant of type 2 diabetes, essential hypertension BPH, vascular dementia, chronic kidney disease stage III, GERD, paroxysmal atrial fibrillation,  coronary artery disease, seizure disorder, bilateral hearing loss, esophageal dysphagia among other things he is a resident of skilled nursing facility and has recently been having poor oral intake nausea and vomiting.  Patient has also exhibited some choking sensation when he ate.  He has been losing weight lately.  Chest xray was showing no acute cardiopulmonary disease. He is known to ST service with MBS completed on 05/19/2020 with concern for possible esophageal dysphagia. Clinical Impression: Clinical Impression: Patient is presenting with a mild-moderate oral dysphagia and a mod-severe pharyngoesophageal dysphagia as per this MBS. Patient exhibited delays to pyrifom sinus with all tested barium consistencies (thin, nectar thick and honey thick liquids, puree solids). There were several instances of boulses starting to pass through PES before swallow was initiated. SLP had to cue patient to swallow frequently during this study. He exhibited one instance of sensed aspiration of thin liquids just below vocal cords. After swallows were finally initiated, he did exhibit clearance of majority of boluses with only trace to mild diffuse pharyngeal residuals s/p initial swallow. Towards the end of the study, retrograde movement of large amount of barium from esophagus and up through PES into pharynx (reaching as high as vallecular sinus) was observed. Deep penetration of this refluxed barium was observed. Esophageal sweep images were not clear but there did appear to be stasis of barium in esophagus. SLP is recommending to continue with current PO diet of Dys 1 (puree) solids and thin liquids  and to follow strict aspiration precautions. GI is planning for EGD with possible dilation during this admission. Factors that may increase risk of adverse event in presence of aspiration [name]: Factors that may increase risk of adverse event in presence of aspiration [name]: Frail or  deconditioned; Respiratory or GI disease Recommendations/Plan: Swallowing Evaluation Recommendations Swallowing Evaluation Recommendations Recommendations: PO diet PO Diet Recommendation: Dysphagia 1 (Pureed); Thin liquids (Level 0) Liquid Administration via: Cup Medication Administration: Crushed with puree Supervision: Full assist for feeding; Full supervision/cueing for swallowing strategies Swallowing strategies  : Slow rate; Small bites/sips Postural changes: Stay upright 30-60 min after meals; Position pt fully upright for meals Oral care recommendations: Oral care BID (2x/day) Treatment Plan Treatment Plan Treatment recommendations: Therapy as outlined in treatment plan below Follow-up recommendations: Other (comment) (TBD pending POC recommendations) Functional status assessment: Patient has had a recent decline in their functional status and demonstrates the ability to make significant improvements in function in a reasonable and predictable amount of time. Treatment frequency: Min 2x/week Treatment duration: 1 week Interventions: Aspiration precaution training; Diet toleration management by  SLP; Patient/family education Recommendations Recommendations for follow up therapy are one component of a multi-disciplinary discharge planning process, led by the attending physician.  Recommendations may be updated based on patient status, additional functional criteria and insurance authorization. Assessment: Orofacial Exam: Orofacial Exam Oral Cavity: Oral Hygiene: WFL Oral Cavity - Dentition: Edentulous Orofacial Anatomy: WFL Oral Motor/Sensory Function: WFL Anatomy: Anatomy: Other (Comment) (difficult to visualize, suspect prominent cricopharyngeal bar) Boluses Administered: Boluses Administered Boluses Administered: Thin liquids (Level 0); Mildly thick liquids (Level 2, nectar thick); Moderately thick liquids (Level 3, honey thick); Puree  Oral Impairment Domain: Oral Impairment Domain Lip Closure: No labial  escape Tongue control during bolus hold: Not tested Bolus preparation/mastication: Slow prolonged chewing/mashing with complete recollection Bolus transport/lingual motion: Slow tongue motion Oral residue: Residue collection on oral structures Location of oral residue : Tongue; Lateral sulci Initiation of pharyngeal swallow : Pyriform sinuses  Pharyngeal Impairment Domain: Pharyngeal Impairment Domain Soft palate elevation: Trace column of contrast or air between SP and PW Laryngeal elevation: Partial superior movement of thyroid cartilage/partial approximation of arytenoids to epiglottic petiole Anterior hyoid excursion: Partial anterior movement Epiglottic movement: Partial inversion Laryngeal vestibule closure: Incomplete, narrow column air/contrast in laryngeal vestibule Pharyngeal stripping wave : Present - complete Pharyngeal contraction (A/P view only): N/A Pharyngoesophageal segment opening: Complete distension and complete duration, no obstruction of flow Tongue base retraction: Trace column of contrast or air between tongue base and PPW Pharyngeal residue: Collection of residue within or on pharyngeal structures Location of pharyngeal residue: Tongue base; Valleculae; Pyriform sinuses; Aryepiglottic folds; Diffuse (>3 areas)  Esophageal Impairment Domain: Esophageal Impairment Domain Esophageal clearance upright position: Esophageal retention with retrograde flow through the PES Pill: No data recorded Penetration/Aspiration Scale Score: Penetration/Aspiration Scale Score 1.  Material does not enter airway: Moderately thick liquids (Level 3, honey thick); Puree 2.  Material enters airway, remains ABOVE vocal cords then ejected out: Mildly thick liquids (Level 2, nectar thick) 7.  Material enters airway, passes BELOW cords and not ejected out despite cough attempt by patient: Thin liquids (Level 0) Compensatory Strategies: Compensatory Strategies Compensatory strategies: No   General Information: No data  recorded Diet Prior to this Study: Dysphagia 1 (pureed); Thin liquids (Level 0)   Temperature : Normal   Respiratory Status: WFL   Supplemental O2: None (Room air)   History of Recent Intubation: No  Behavior/Cognition: Alert; Cooperative Self-Feeding Abilities: Able to self-feed Baseline vocal quality/speech: Normal Volitional Cough: Unable to elicit Volitional Swallow: Unable to elicit Exam Limitations: No limitations Goal Planning: Prognosis for improved oropharyngeal function: Fair Barriers to Reach Goals: Severity of deficits No data recorded Patient/Family Stated Goal: None stated Consulted and agree with results and recommendations: Pt unable/family or caregiver not available Pain: Pain Assessment Pain Assessment: No/denies pain End of Session: Start Time:SLP Start Time (ACUTE ONLY): 1200 Stop Time: SLP Stop Time (ACUTE ONLY): 1225 Time Calculation:SLP Time Calculation (min) (ACUTE ONLY): 25 min Charges: SLP Evaluations $ SLP Speech Visit: 1 Visit SLP Evaluations $BSS Swallow: 1 Procedure $MBS Swallow: 1 Procedure SLP visit diagnosis: SLP Visit Diagnosis: Dysphagia, pharyngoesophageal phase (R13.14); Dysphagia, oropharyngeal phase (R13.12) Past Medical History: Past Medical History: Diagnosis Date  AAA (abdominal aortic aneurysm) without rupture (HCC) 06/2014  no change in last exam from 2015  Achalasia   Acute right hemiparesis (HCC) 05/18/2020  Anxiety   Arthritis   knees,lower back  Atrial fibrillation (HCC)   Atrial flutter with rapid ventricular response (HCC) 01/16/2015  Bilateral wrist pain 09/09/2014  Bladder outlet obstruction  01/20/2015  Bradycardia 08/27/2016  CAD (coronary artery disease)   pt denies  Carotid artery disease (HCC)   Chronic back pain   Chronic kidney disease (CKD), stage III (moderate) (HCC)   Constipation   Decreased visual acuity of left eye  12/02/2018  Depression   Dysphagia, oropharyngeal phase   Dyspnea   reports today no pregoression  Erectile dysfunction   GERD  (gastroesophageal reflux disease)   Headache   hx of  History of CVA (cerebrovascular accident)   2009  &  2013  History of Multiple lacunar infarcts (HCC) 01/31/2015  MRI Brain 01/31/2015: Widespread old lacunar infarcts, particularly right Basal ganglia, pons, and right cerebellum  HLD (hyperlipidemia)   Hypertension   Idiopathic pancreatitis 12/2014  acute  Knee pain, bilateral 04/09/2011  Malnutrition of moderate degree 01/25/2015  Memory impairment   mild dementia per PCP progress note   Morbid obesity (HCC) 12/03/2018  Other abnormalities of gait and mobility 12/03/2018  PVD (peripheral vascular disease) with claudication (HCC)   Type 2 diabetes mellitus (HCC)   Urethral stricture 02/16/2013  Urge incontinence 12/08/2018  Vascular dementia [facility])  Past Surgical History: Past Surgical History: Procedure Laterality Date  back injections    [facility]  BALLOON DILATION N/A 12/16/2018  Procedure: URETHRAL BALLOON DILATION;  Surgeon: Crist Fat, MD;  Location: [facility];  Service: Urology;  Laterality: N/A;  BIOPSY  05/22/2020  Procedure: BIOPSY;  Surgeon: [name], MD;  Location: [facility];  Service: Gastroenterology;;  CARDIOVASCULAR STRESS TEST  08-09-2009  mild global hypokinesis,  no ischemia or scar/  ef 41%  CATARACT EXTRACTION W/ INTRAOCULAR LENS  IMPLANT, BILATERAL  april and may 2014  CYSTOSCOPY N/A 01/17/2017  Procedure: CYSTOSCOPY FLEXIBLE;  Surgeon: Crist Fat, MD;  Location: [facility];  Service: Urology;  Laterality: N/A;  CYSTOSCOPY WITH RETROGRADE URETHROGRAM N/A 11/24/2014  Procedure: CYSTOSCOPY WITH RETROGRADE URETHROGRAM;  Surgeon: Crist Fat, MD;  Location[facility];  Service: Urology;  Laterality: N/A;  CYSTOSCOPY WITH RETROGRADE URETHROGRAM N/A 12/16/2018  Procedure: CYSTOSCOPY WITH RETROGRADE URETHROGRAM;  Surgeon: Crist Fat, MD;  Location: [facility];  Service: Urology;  Laterality: N/A;  CYSTOSCOPY WITH RETROGRADE URETHROGRAM  N/A 12/16/2019  Procedure: CYSTOSCOPY WITH RETROGRADE, [employer] Internal Urethrotomy;  Surgeon: Crist Fat, MD;  Location: [facility];  Service: Urology;  Laterality: N/A;  CYSTOSCOPY WITH URETHRAL DILATATION N/A 11/24/2014  Procedure: CYSTOSCOPY WITH URETHRAL DILATATION;  Surgeon: Crist Fat, MD;  Location[facility];  Service: Urology;  Laterality: N/A;  BALLOON DILATION   ESOPHAGEAL DILATION  05/22/2020  Procedure: ESOPHAGEAL DILATION;  Surgeon: [name], MD;  Location: [facility];  Service: Gastroenterology;;  ESOPHAGOGASTRODUODENOSCOPY N/A 05/15/2014  Procedure: ESOPHAGOGASTRODUODENOSCOPY (EGD);  Surgeon: Carman Ching, MD;  Location: [facility] ENDOSCOPY;  Service: Endoscopy;  Laterality: N/A;  ESOPHAGOGASTRODUODENOSCOPY Left 02/02/2015  Procedure: ESOPHAGOGASTRODUODENOSCOPY (EGD);  Surgeon: Dorena Cookey, MD;  Location: [facility] ENDOSCOPY;  Service: Endoscopy;  Laterality: Left;  ESOPHAGOGASTRODUODENOSCOPY Left 03/16/2015  Procedure: ESOPHAGOGASTRODUODENOSCOPY (EGD);  Surgeon: Dorena Cookey, MD;  Location: [facility];  Service: Endoscopy;  Laterality: Left;  ESOPHAGOGASTRODUODENOSCOPY (EGD) WITH PROPOFOL N/A 05/22/2020  Procedure: ESOPHAGOGASTRODUODENOSCOPY (EGD) WITH PROPOFOL;  Surgeon: [name], MD;  Location: [facility];  Service: Gastroenterology;  Laterality: N/A;  FOREIGN BODY REMOVAL  05/22/2020  Procedure: FOREIGN BODY REMOVAL;  Surgeon: [name], MD;  Location: [facility];  Service: Gastroenterology;;  TRANSTHORACIC ECHOCARDIOGRAM  01-07-2012  mild to moderate dilated LV,  ef 45-50%,  mild basal hypokinesis,  grade I diastolic  dysfunction/  trivial AR/  mild MR  URETHROPLASTY N/A 01/17/2017  Procedure: URETHEROPLASTY BUCCAL GRAFT AND BUCCAL MUCOSA GRAFT HARVEST;  Surgeon: Crist Fat, MD;  Location: [facility];  Service: Urology;  Laterality: N/A; Angela Nevin, MA, CCC-SLP Speech Therapy    Scheduled Meds:  feeding supplement  237 mL Oral BID BM    pantoprazole  40 mg Oral Daily   Continuous Infusions:   LOS: 4 days   Time spent: 40 minutes  Carollee Herter, DO  [facility]  03/18/2023, 1:14 PM

## 2023-03-18 NOTE — Progress Notes (Signed)
SLP Cancellation Note  Patient Details Name: Brian Burns MRN: [id number] DOB: 1945/04/08   Cancelled treatment:       Reason Eval/Treat Not Completed: Other (comment) (pt npo for endoscopy per notes)  Rolena Infante, MS [facility] SLP Acute Rehab Services Office [phone]   Chales Abrahams 03/18/2023, 8:52 AM

## 2023-03-18 NOTE — Progress Notes (Addendum)
Daily Progress Note  DOA: 03/14/2023 Hospital Day: 5   Chief Complaint: Dysphagia, failure to thrive, nausea / vomiting  ASSESSMENT    Brief Narrative:  Brian Burns is a 78 y.o. year old male with a history of DM2, HTN, BPH, dementia, CVA, CKD3, Afib, seizures, chronic anticoagulation, GERD. Recently seen in our office for dysphagia and also a CT scan showing proctitis. He was scheduled for EGD / colonoscopy but in the interim admitted 2/14 for N/V and AKI and failure to thrive. GI saw in consult 2/17   Dysphagia / history of severe esophageal dysmotility / esophageal web. Admitted with N/V and failure to thrive with weight loss MBSS yesterday showed mod-severe pharyngoesophageal dysphagia  Deep penetration of refluxed barium was observed. Esophageal sweep images were not clear but there did appear to be stasis of barium in esophagus. SLP recommended to continue Dys 1 (puree) solids and thin liquids and to follow strict aspiration precautions.   Proctitis on CT scan in October Was scheduled for outpatient colonoscopy later this month to evaluate findings. Currently unable to tolerate a bowel prep.   Principal Problem:   Acute kidney injury superimposed on stage 3a chronic kidney disease (HCC) - baseline SCr 1.6 Active Problems:   Type 2 diabetes mellitus with hypercholesterolemia (HCC)   Hypertension associated with diabetes (HCC)   BPH (benign prostatic hyperplasia)   GERD (gastroesophageal reflux disease)   CKD stage 3a, GFR 45-59 ml/min (HCC) - baseline scr 1.6   Paroxysmal atrial fibrillation (HCC)   Thrombocytopenia (HCC)   Coronary artery disease involving native coronary artery of native heart without angina pectoris   Dementia, vascular (HCC)   Seizure (HCC)   Nausea   DNR (do not resuscitate)/DNI(Do Not intubate)   Oral phase dysphagia   History of anemia due to CKD   PLAN    --EGD with possible esophageal dilation is scheduled for tomorrow. Was supposed  to be done today but NPO order got discontinued?  --Unable to tolerate a bowel prep for colonoscopy to evaluate proctitis on CT scan. Could consider flex sig at some point   Subjective   Non-verbal except to say he wants a soda   Objective    Recent Labs    03/16/23 0947  WBC 3.7*  HGB 9.5*  HCT 32.5*  PLT 106*   BMET Recent Labs    03/16/23 0947 03/17/23 0448  NA 141 141  K 4.2 4.0  CL 111 110  CO2 23 23  GLUCOSE 99 83  BUN 18 17  CREATININE 1.50* 1.44*  CALCIUM 8.2* 8.4*   LFT Recent Labs    03/17/23 0448  PROT 6.4*  ALBUMIN 2.3*  AST 23  ALT 17  ALKPHOS 69  BILITOT 0.8   PT/INR Recent Labs    03/17/23 1409  LABPROT 16.7*  INR 1.3*     Imaging:  DG Swallowing Func-Speech Pathology Table formatting from the original result was not included. Modified Barium Swallow Study  Patient Details  Name: Brian Burns MRN: [id number] Date of Birth: Jun 20, 1945  Today's Date: 03/17/2023  HPI/PMH: HPI: Brian Burns is a 78 y.o. male with medical history significant of  type 2 diabetes, essential hypertension BPH, vascular dementia, chronic  kidney disease stage III, GERD, paroxysmal atrial fibrillation, coronary  artery disease, seizure disorder, bilateral hearing loss, esophageal  dysphagia among other things he is a resident of skilled nursing facility  and has recently been having poor oral intake nausea and vomiting.  Patient has also exhibited some choking sensation when he ate.  He has  been losing weight lately.  Chest xray was showing no acute  cardiopulmonary disease. He is known to ST service with MBS completed on  05/19/2020 with concern for possible esophageal dysphagia.  Clinical Impression: Clinical Impression: Patient is presenting with a mild-moderate oral  dysphagia and a mod-severe pharyngoesophageal dysphagia as per this MBS.  Patient exhibited delays to pyrifom sinus with all tested barium  consistencies (thin, nectar thick  and honey thick liquids, puree solids).  There were several instances of boulses starting to pass through PES  before swallow was initiated. SLP had to cue patient to swallow frequently  during this study. He exhibited one instance of sensed aspiration of thin  liquids just below vocal cords. After swallows were finally initiated, he  did exhibit Brian of majority of boluses with only trace to mild  diffuse pharyngeal residuals s/p initial swallow. Towards the end of the  study, retrograde movement of large amount of barium from esophagus and up  through PES into pharynx (reaching as high as vallecular sinus) was  observed. Deep penetration of this refluxed barium was observed.  Esophageal sweep images were not clear but there did appear to be stasis  of barium in esophagus. SLP is recommending to continue with current PO  diet of Dys 1 (puree) solids and thin liquids  and to follow strict  aspiration precautions. GI is planning for EGD with possible dilation  during this admission.  Factors that may increase risk of adverse event in presence of aspiration  Brian Burns & Brian Burns Burns): Factors that may increase risk of adverse event  in presence of aspiration Brian Burns & Brian Burns Burns): Frail or deconditioned;  Respiratory or GI disease  Recommendations/Plan: Swallowing Evaluation Recommendations Swallowing Evaluation Recommendations Recommendations: PO diet PO Diet Recommendation: Dysphagia 1 (Pureed); Thin liquids (Level 0) Liquid Administration via: Cup Medication Administration: Crushed with puree Supervision: Full assist for feeding; Full supervision/cueing for  swallowing strategies Swallowing strategies  : Slow rate; Small bites/sips Postural changes: Stay upright 30-60 min after meals; Position pt fully  upright for meals Oral care recommendations: Oral care BID (2x/day)  Treatment Plan Treatment Plan Treatment recommendations: Therapy as outlined in treatment plan  below Follow-up recommendations: Other (comment) (TBD pending POC  recommendations) Functional status assessment: Patient has had a recent decline in their  functional status and demonstrates the ability to make significant  improvements in function in a reasonable and predictable amount of time. Treatment frequency: Min 2x/week Treatment duration: 1 week Interventions: Aspiration precaution training; Diet toleration management  by SLP; Patient/family education  Recommendations Recommendations for follow up therapy are one component of a  multi-disciplinary discharge planning process, led by the attending  physician.  Recommendations may be updated based on patient status,  additional functional criteria and insurance authorization.  Assessment: Orofacial Exam: Orofacial Exam Oral Cavity: Oral Hygiene: WFL Oral Cavity - Dentition: Edentulous Orofacial Anatomy: WFL Oral Motor/Sensory Function: WFL  Anatomy:  Anatomy: Other (Comment) (difficult to visualize, suspect prominent  cricopharyngeal bar)  Boluses Administered: Boluses Administered Boluses Administered: Thin liquids (Level 0); Mildly thick liquids (Level  2, nectar thick); Moderately thick liquids (Level 3, honey thick); Puree     Oral Impairment Domain: Oral Impairment Domain Lip Closure: No labial escape Tongue control during bolus hold: Not tested Bolus preparation/mastication: Slow prolonged chewing/mashing with  complete recollection Bolus transport/lingual motion: Slow tongue motion Oral residue: Residue collection on oral structures Location of oral  residue : Tongue; Lateral sulci Initiation of pharyngeal swallow : Pyriform sinuses     Pharyngeal Impairment Domain: Pharyngeal Impairment Domain Soft palate elevation: Trace column of contrast or air between SP and PW Laryngeal elevation: Partial superior movement of thyroid  cartilage/partial approximation of arytenoids to epiglottic petiole Anterior  hyoid excursion: Partial anterior movement Epiglottic movement: Partial inversion Laryngeal vestibule closure: Incomplete, narrow column air/contrast in  laryngeal vestibule Pharyngeal stripping wave : Present - complete Pharyngeal contraction (A/P view only): N/A Pharyngoesophageal segment opening: Complete distension and complete  duration, no obstruction of flow Tongue base retraction: Trace column of contrast or air between tongue  base and PPW Pharyngeal residue: Collection of residue within or on pharyngeal  structures Location of pharyngeal residue: Tongue base; Valleculae; Pyriform sinuses;  Aryepiglottic folds; Diffuse (>3 areas)     Esophageal Impairment Domain: Esophageal Impairment Domain Esophageal Brian upright position: Esophageal retention with  retrograde flow through the PES  Pill: No data recorded  Penetration/Aspiration Scale Score: Penetration/Aspiration Scale Score 1.  Material does not enter airway: Moderately thick liquids (Level 3,  honey thick); Puree 2.  Material enters airway, remains ABOVE vocal cords then ejected out:  Mildly thick liquids (Level 2, nectar thick) 7.  Material enters airway, passes BELOW cords and not ejected out despite  cough attempt by patient: Thin liquids (Level 0)  Compensatory Strategies: Compensatory Strategies Compensatory strategies: No      General Information: No data recorded  Diet Prior to this Study: Dysphagia 1 (pureed); Thin liquids (Level 0)    Temperature : Normal    Respiratory Status: WFL    Supplemental O2: None (Room air)    History of Recent Intubation: No   Behavior/Cognition: Alert; Cooperative  Self-Feeding Abilities: Able to self-feed  Baseline vocal quality/speech: Normal  Volitional Cough: Unable to elicit  Volitional Swallow: Unable to elicit  Exam Limitations: No limitations  Goal Planning: Prognosis for improved oropharyngeal function: Fair  Barriers to Reach Goals:  Severity of deficits  No data recorded Patient/Family Stated Goal: None stated  Consulted and agree with results and recommendations: Pt unable/family or  caregiver not available  Pain: Pain Assessment Pain Assessment: No/denies pain  End of Session: Start Time:SLP Start Time (ACUTE ONLY): 1200  Stop Time: SLP Stop Time (ACUTE ONLY): 1225  Time Calculation:SLP Time Calculation (min) (ACUTE ONLY): 25 min  Charges: SLP Evaluations $ SLP Speech Visit: 1 Visit  SLP Evaluations $BSS Swallow: 1 Procedure $MBS Swallow: 1 Procedure  SLP visit diagnosis: SLP Visit Diagnosis: Dysphagia, pharyngoesophageal  phase (R13.14); Dysphagia, oropharyngeal phase (R13.12)  Past Medical History:  Past Medical History:  Diagnosis Date   AAA (abdominal aortic aneurysm) without rupture (HCC) 06/2014   no change in last exam from 2015   Achalasia    Acute right hemiparesis (HCC) 05/18/2020   Anxiety    Arthritis    knees,lower back   Atrial fibrillation (HCC)    Atrial flutter with rapid ventricular response (HCC) 01/16/2015   Bilateral wrist pain 09/09/2014   Bladder outlet obstruction 01/20/2015   Bradycardia 08/27/2016   CAD (coronary artery disease)    pt denies   Carotid artery disease (HCC)    Chronic back pain    Chronic kidney disease (CKD), stage III (moderate) (HCC)    Constipation    Decreased visual acuity of left eye  12/02/2018   Depression    Dysphagia, oropharyngeal phase    Dyspnea    reports today no pregoression   Erectile dysfunction  GERD (gastroesophageal reflux disease)    Headache    hx of   History of CVA (cerebrovascular accident)    2009  &  2013   History of Multiple lacunar infarcts (HCC) 01/31/2015   MRI Brain 01/31/2015: Widespread old lacunar infarcts, particularly right  Basal ganglia, pons, and right cerebellum   HLD (hyperlipidemia)    Hypertension    Idiopathic pancreatitis 12/2014   acute   Knee pain, bilateral 04/09/2011   Malnutrition  of moderate degree 01/25/2015   Memory impairment    mild dementia per PCP progress note    Morbid obesity (HCC) 12/03/2018   Other abnormalities of gait and mobility 12/03/2018   PVD (peripheral vascular disease) with claudication (HCC)    Type 2 diabetes mellitus (HCC)    Urethral stricture 02/16/2013   Urge incontinence 12/08/2018   Vascular dementia [facility])    Past Surgical History:  Past Surgical History:  Procedure Laterality Date   back injections      Orthopedics   BALLOON DILATION N/A 12/16/2018   Procedure: URETHRAL BALLOON DILATION;  Surgeon: Crist Fat, MD;   Location: [facility];  Service: Urology;  Laterality: N/A;   BIOPSY  05/22/2020   Procedure: BIOPSY;  Surgeon: [name], MD;  Location: [facility]  ENDOSCOPY;  Service: Gastroenterology;;   CARDIOVASCULAR STRESS TEST  08-09-2009   mild global hypokinesis,  no ischemia or scar/  ef 41%   CATARACT EXTRACTION W/ INTRAOCULAR LENS  IMPLANT, BILATERAL  april and  may 2014   CYSTOSCOPY N/A 01/17/2017   Procedure: CYSTOSCOPY FLEXIBLE;  Surgeon: Crist Fat, MD;   Location: [facility];  Service: Urology;  Laterality: N/A;   CYSTOSCOPY WITH RETROGRADE URETHROGRAM N/A 11/24/2014   Procedure: CYSTOSCOPY WITH RETROGRADE URETHROGRAM;  Surgeon: Crist Fat, MD;  Location[facility];  Service: Urology;   Laterality: N/A;   CYSTOSCOPY WITH RETROGRADE URETHROGRAM N/A 12/16/2018   Procedure: CYSTOSCOPY WITH RETROGRADE URETHROGRAM;  Surgeon: Crist Fat, MD;  Location: [facility];  Service: Urology;  Laterality: N/A;   CYSTOSCOPY WITH RETROGRADE URETHROGRAM N/A 11/18/Burns   Procedure: CYSTOSCOPY WITH RETROGRADE, Direct Vision Internal  Urethrotomy;  Surgeon: Crist Fat, MD;  Location: [facility];   Service: Urology;  Laterality: N/A;   CYSTOSCOPY WITH URETHRAL DILATATION N/A 11/24/2014   Procedure: CYSTOSCOPY WITH URETHRAL DILATATION;  Surgeon: Crist Fat, MD;   Location: [facility];  Service: Urology;   Laterality: N/A;  BALLOON DILATION     ESOPHAGEAL DILATION  05/22/2020   Procedure: ESOPHAGEAL DILATION;  Surgeon: [name], MD;   Location: [facility] ENDOS[facility];  Service: Gastroenterology;;   ESOPHAGOGASTRODUODENOSCOPY N/A 05/15/2014   Procedure: ESOPHAGOGASTRODUODENOSCOPY (EGD);  Surgeon: Carman Ching, MD;   Location: [facility] ENDOSCOPY;  Service: Endoscopy;  Laterality: N/A;   ESOPHAGOGASTRODUODENOSCOPY Left 02/02/2015   Procedure: ESOPHAGOGASTRODUODENOSCOPY (EGD);  Surgeon: Dorena Cookey, MD;   Location: [facility] ENDOSCOPY;  Service: Endoscopy;  Laterality: Left;   ESOPHAGOGASTRODUODENOSCOPY Left 03/16/2015   Procedure: ESOPHAGOGASTRODUODENOSCOPY (EGD);  Surgeon: Dorena Cookey, MD;   Location: [facility];  Service: Endoscopy;  Laterality: Left;   ESOPHAGOGASTRODUODENOSCOPY (EGD) WITH PROPOFOL N/A 05/22/2020   Procedure: ESOPHAGOGASTRODUODENOSCOPY (EGD) WITH PROPOFOL;  Surgeon:  [name], MD;  Location: [facility] ENDOS[facility];  Service:  Gastroenterology;  Laterality: N/A;   FOREIGN BODY REMOVAL  05/22/2020   Procedure: FOREIGN BODY REMOVAL;  Surgeon: [name], MD;   Location: [facility] ENDOSCOPY;  Service: Gastroenterology;;   TRANSTHORACIC ECHOCARDIOGRAM  01-07-2012   mild to  moderate dilated LV,  ef 45-50%,  mild basal hypokinesis,  grade  I diastolic  dysfunction/  trivial AR/  mild MR   URETHROPLASTY N/A 01/17/2017   Procedure: URETHEROPLASTY BUCCAL GRAFT AND BUCCAL MUCOSA GRAFT HARVEST;   Surgeon: Crist Fat, MD;  Location: [facility];  Service: Urology;   Laterality: N/A;   Angela Nevin, MA, CCC-SLP Speech Therapy     Scheduled inpatient medications:   feeding supplement  237 mL Oral BID BM   pantoprazole  40 mg Oral Daily   Continuous inpatient infusions:  PRN inpatient medications: acetaminophen, bisacodyl, morphine injection, ondansetron **OR** ondansetron (ZOFRAN) IV, sodium phosphate  Vital signs in last 24  hours: Temp:  [97.6 F (36.4 C)-98.2 F (36.8 C)] 98.2 F (36.8 C) (02/18 0355) Pulse Rate:  [55-65] 55 (02/18 0355) Resp:  [16-18] 16 (02/18 0355) BP: (103-111)/(63-76) 103/63 (02/18 0355) SpO2:  [99 %-100 %] 99 % (02/18 0355)    Intake/Output Summary (Last 24 hours) at 03/18/2023 1148 Last data filed at 03/18/2023 0617 Gross per 24 hour  Intake 240 ml  Output 400 ml  Net -160 ml    Intake/Output from previous day: 02/17 0701 - 02/18 0700 In: 240 [P.O.:240] Out: 400 [Urine:400] Intake/Output this shift: No intake/output data recorded.   Physical Exam:  General: Alert male in NAD Heart:  Regular rate and rhythm.  Pulmonary: Normal respiratory effort Abdomen: Soft, nondistended, nontender. Normal bowel sounds. Psych: Good eye contact. Answers very few questions with one word. .      LOS: 4 days   Willette Cluster ,NP 03/18/2023, 11:48 AM   Attending physician's note   I have taken history, reviewed the chart and examined the patient. I performed a substantive portion of this encounter, including complete performance of at least one of the key components, in conjunction with the APP. I agree with the Advanced Practitioner's note, impression and recommendations.   EGD with dil in AM.  Re-scheduled as he took Ensure Would not be able to tolerate bowel prep for colon this adm. Can be considered as outpt.   Edman Circle, MD [facility] [phone]

## 2023-03-18 NOTE — Progress Notes (Signed)
1915-  pt calm resting in bed   no distress noted safety measures in place updated DO on output from foley  handoff report completed at bedside with terrana Rn

## 2023-03-18 NOTE — Progress Notes (Addendum)
1045-    egd  to be completed tomorrow    consent not completed   and unknown what pt had as per intake through night shift  pt daughter called   np gaultier called pt daughter explained procedure    daughter [name]) agreeable    Verbalized understanding of procedure to this nurse and Garfield Cornea Rn    consent placed in chart   1530- Bladder scans completed through shift, monitoring output  and output minimal   Contacted attending after multiple attempts with Luanna Cole and this nurse to complete in and out   Attempts unsuccessful   urology floor contacted by charge Rn   DO placed consult     Awaiting urology to floor for further poc  1635-  charge Rn contacted urology charge Baird Lyons  and asked if she could assist with this pt in and out     awaiting urology consult response and if no response  this nurse will contact casey in urology   1756- no response from urology, casey Rn contacted 0n urology floor  DO chen messaged   order placed for coude insertion   urology Rn to come to floor to place coude per DO order for retention  see orders   1818- daughter at bedside  updated per poc and catheter need

## 2023-03-18 NOTE — Progress Notes (Addendum)
Garner Nash, NP notified that patient has not voided for greater than 8 hours. Bladder scan completed and yielded a total volume of 543 mL.  Order obtained to complete I&O.  I&O cath attempted twice without success- catheter begin coiling making it difficult to advance. Patient denies bladder pain or urinary urgency.  Patient voided a small amount of urine once catheter removed. Provider notified- no new orders.

## 2023-03-19 ENCOUNTER — Inpatient Hospital Stay (HOSPITAL_COMMUNITY): Payer: 59 | Admitting: Certified Registered"

## 2023-03-19 ENCOUNTER — Encounter (HOSPITAL_COMMUNITY)
Admission: EM | Disposition: A | Discharge status: Skilled Nursing Facility | Payer: Self-pay | Source: Skilled Nursing Facility | Attending: Family Medicine

## 2023-03-19 ENCOUNTER — Encounter (HOSPITAL_COMMUNITY): Payer: Self-pay | Admitting: Internal Medicine

## 2023-03-19 DIAGNOSIS — I4891 Unspecified atrial fibrillation: Secondary | ICD-10-CM

## 2023-03-19 DIAGNOSIS — B3781 Candidal esophagitis: Secondary | ICD-10-CM | POA: Diagnosis not present

## 2023-03-19 DIAGNOSIS — I2510 Atherosclerotic heart disease of native coronary artery without angina pectoris: Secondary | ICD-10-CM

## 2023-03-19 DIAGNOSIS — K2289 Other specified disease of esophagus: Secondary | ICD-10-CM

## 2023-03-19 DIAGNOSIS — N1831 Chronic kidney disease, stage 3a: Secondary | ICD-10-CM | POA: Diagnosis not present

## 2023-03-19 DIAGNOSIS — K222 Esophageal obstruction: Secondary | ICD-10-CM

## 2023-03-19 DIAGNOSIS — R1310 Dysphagia, unspecified: Secondary | ICD-10-CM | POA: Diagnosis not present

## 2023-03-19 DIAGNOSIS — N179 Acute kidney failure, unspecified: Secondary | ICD-10-CM | POA: Diagnosis not present

## 2023-03-19 HISTORY — PX: ESOPHAGOGASTRODUODENOSCOPY (EGD) WITH PROPOFOL: SHX5813

## 2023-03-19 HISTORY — PX: BIOPSY: SHX5522

## 2023-03-19 LAB — CBC WITH DIFFERENTIAL/PLATELET
Abs Immature Granulocytes: 0.02 10*3/uL (ref 0.00–0.07)
Basophils Absolute: 0 10*3/uL (ref 0.0–0.1)
Basophils Relative: 0 %
Eosinophils Absolute: 0.1 10*3/uL (ref 0.0–0.5)
Eosinophils Relative: 1 %
HCT: 31.8 % — ABNORMAL LOW (ref 39.0–52.0)
Hemoglobin: 9.7 g/dL — ABNORMAL LOW (ref 13.0–17.0)
Immature Granulocytes: 0 %
Lymphocytes Relative: 22 %
Lymphs Abs: 1.2 10*3/uL (ref 0.7–4.0)
MCH: 29 pg (ref 26.0–34.0)
MCHC: 30.5 g/dL (ref 30.0–36.0)
MCV: 95.2 fL (ref 80.0–100.0)
Monocytes Absolute: 0.5 10*3/uL (ref 0.1–1.0)
Monocytes Relative: 10 %
Neutro Abs: 3.8 10*3/uL (ref 1.7–7.7)
Neutrophils Relative %: 67 %
Platelets: 109 10*3/uL — ABNORMAL LOW (ref 150–400)
RBC: 3.34 MIL/uL — ABNORMAL LOW (ref 4.22–5.81)
RDW: 17.7 % — ABNORMAL HIGH (ref 11.5–15.5)
WBC: 5.6 10*3/uL (ref 4.0–10.5)
nRBC: 0 % (ref 0.0–0.2)

## 2023-03-19 LAB — COMPREHENSIVE METABOLIC PANEL
ALT: 17 U/L (ref 0–44)
AST: 22 U/L (ref 15–41)
Albumin: 2.6 g/dL — ABNORMAL LOW (ref 3.5–5.0)
Alkaline Phosphatase: 73 U/L (ref 38–126)
Anion gap: 6 (ref 5–15)
BUN: 16 mg/dL (ref 8–23)
CO2: 24 mmol/L (ref 22–32)
Calcium: 8.5 mg/dL — ABNORMAL LOW (ref 8.9–10.3)
Chloride: 110 mmol/L (ref 98–111)
Creatinine, Ser: 1.29 mg/dL — ABNORMAL HIGH (ref 0.61–1.24)
GFR, Estimated: 57 mL/min — ABNORMAL LOW (ref >60)
Glucose, Bld: 87 mg/dL (ref 70–99)
Potassium: 3.7 mmol/L (ref 3.5–5.1)
Sodium: 140 mmol/L (ref 135–145)
Total Bilirubin: 0.5 mg/dL (ref 0.0–1.2)
Total Protein: 6.5 g/dL (ref 6.5–8.1)

## 2023-03-19 SURGERY — ESOPHAGOGASTRODUODENOSCOPY (EGD) WITH PROPOFOL
Anesthesia: Monitor Anesthesia Care

## 2023-03-19 MED ORDER — SODIUM CHLORIDE 0.9 % IV SOLN: 12.5000 mg | INTRAVENOUS | Status: DC | PRN

## 2023-03-19 MED ORDER — LIDOCAINE 2% (20 MG/ML) 5 ML SYRINGE
INTRAMUSCULAR | Status: DC | PRN
  Administered 2023-03-19: 60 mg via INTRAVENOUS

## 2023-03-19 MED ORDER — CHLORHEXIDINE GLUCONATE CLOTH 2 % EX PADS
6.0000 | MEDICATED_PAD | Freq: Every day | CUTANEOUS | Status: DC
  Administered 2023-03-21: 6 via TOPICAL

## 2023-03-19 MED ORDER — PROPOFOL 500 MG/50ML IV EMUL
INTRAVENOUS | Status: DC | PRN
  Administered 2023-03-19: 100 ug/kg/min via INTRAVENOUS

## 2023-03-19 MED ORDER — OXYCODONE HCL 5 MG PO TABS
5.0000 mg | ORAL_TABLET | Freq: Once | ORAL | Status: AC | PRN
  Administered 2023-03-21: 5 mg via ORAL
  Filled 2023-03-19: qty 1

## 2023-03-19 MED ORDER — HYDROMORPHONE HCL 1 MG/ML IJ SOLN
0.2500 mg | INTRAMUSCULAR | Status: DC | PRN
Start: 2023-03-19 — End: 2023-03-25

## 2023-03-19 MED ORDER — LACTATED RINGERS IV SOLN: INTRAVENOUS | Status: AC

## 2023-03-19 MED ORDER — PHENYLEPHRINE 80 MCG/ML (10ML) SYRINGE FOR IV PUSH (FOR BLOOD PRESSURE SUPPORT)
PREFILLED_SYRINGE | INTRAVENOUS | Status: DC | PRN
Start: 2023-03-19 — End: 2023-03-19
  Administered 2023-03-19 (×2): 160 ug via INTRAVENOUS

## 2023-03-19 MED ORDER — OXYCODONE HCL 5 MG/5ML PO SOLN
5.0000 mg | Freq: Once | ORAL | Status: AC | PRN
  Filled 2023-03-19: qty 5

## 2023-03-19 MED ORDER — PROPOFOL 10 MG/ML IV BOLUS
INTRAVENOUS | Status: DC | PRN
  Administered 2023-03-19 (×3): 20 mg via INTRAVENOUS

## 2023-03-19 SURGICAL SUPPLY — 14 items

## 2023-03-19 NOTE — H&P (View-Only) (Signed)
For EGD today.  No complaints. Has been NPO  He is awake. No complaints. Spitting into cup VSS Hgb stable overnight at 9.7. Platelets low but stable at 109

## 2023-03-19 NOTE — Progress Notes (Signed)
For EGD today.  No complaints. Has been NPO  He is awake. No complaints. Spitting into cup VSS Hgb stable overnight at 9.7. Platelets low but stable at 109

## 2023-03-19 NOTE — Anesthesia Preprocedure Evaluation (Signed)
Anesthesia Evaluation  Patient identified by MRN, date of birth, ID band Patient awake    Reviewed: Allergy & Precautions, H&P , NPO status , Patient's Chart, lab work & pertinent test results  Airway Mallampati: I  TM Distance: >3 FB Neck ROM: Full    Dental no notable dental hx. (+) Edentulous Upper, Edentulous Lower, Dental Advisory Given   Pulmonary neg pulmonary ROS, former smoker   Pulmonary exam normal breath sounds clear to auscultation       Cardiovascular Exercise Tolerance: Good hypertension, Pt. on medications + CAD and + Peripheral Vascular Disease  + dysrhythmias Atrial Fibrillation  Rhythm:Regular Rate:Normal     Neuro/Psych  Headaches  Anxiety Depression   Dementia    GI/Hepatic Neg liver ROS,GERD  Medicated,,  Endo/Other  negative endocrine ROSdiabetes    Renal/GU Renal InsufficiencyRenal disease  negative genitourinary   Musculoskeletal  (+) Arthritis , Osteoarthritis,    Abdominal   Peds  Hematology  (+) Blood dyscrasia, anemia   Anesthesia Other Findings   Reproductive/Obstetrics negative OB ROS                             Anesthesia Physical Anesthesia Plan  ASA: III  Anesthesia Plan: MAC   Post-op Pain Management:    Induction: Intravenous  PONV Risk Score and Plan: 1 and Propofol infusion  Airway Management Planned: Nasal Cannula  Additional Equipment:   Intra-op Plan:   Post-operative Plan:   Informed Consent: I have reviewed the patients History and Physical, chart, labs and discussed the procedure including the risks, benefits and alternatives for the proposed anesthesia with the patient or authorized representative who has indicated his/her understanding and acceptance.     Dental advisory given  Plan Discussed with: CRNA  Anesthesia Plan Comments:         Anesthesia Quick Evaluation

## 2023-03-19 NOTE — Anesthesia Preprocedure Evaluation (Signed)
Anesthesia Evaluation  Patient identified by MRN, date of birth, ID band Patient awake    Reviewed: Allergy & Precautions, NPO status , Patient's Chart, lab work & pertinent test results  Airway        Dental   Pulmonary former smoker          Cardiovascular hypertension, + CAD and + Peripheral Vascular Disease  + dysrhythmias Atrial Fibrillation   Carotid artery disease, AAA, HLD  TTE 05/19/2020: IMPRESSIONS     1. Left ventricular ejection fraction, by estimation, is 50%. The left  ventricle has mildly decreased function. The left ventricle demonstrates  regional wall motion abnormalities with inferolateral hypokinesis. Left  ventricular diastolic parameters are  consistent with Grade I diastolic dysfunction (impaired relaxation).   2. Right ventricular systolic function is mildly reduced. The right  ventricular size is normal. Tricuspid regurgitation signal is inadequate  for assessing PA pressure.   3. The mitral valve is normal in structure. No evidence of mitral valve  regurgitation. No evidence of mitral stenosis.   4. The aortic valve is tricuspid. Aortic valve regurgitation is mild.  Mild aortic valve sclerosis is present, with no evidence of aortic valve  stenosis.   5. The IVC was not visualized.     Neuro/Psych  Headaches, Seizures -,  PSYCHIATRIC DISORDERS Anxiety Depression   Dementia  Neuromuscular disease (chronic back pain, lumbar spinal stenosis) CVA    GI/Hepatic ,GERD  Medicated,,H/o Pancreatitis, achalasia   Endo/Other  diabetes, Type 2    Renal/GU CRFRenal disease     Musculoskeletal  (+) Arthritis ,    Abdominal   Peds  Hematology  (+) Blood dyscrasia (thrombocytopenia), anemia Lab Results      Component                Value               Date                      WBC                      5.6                 03/19/2023                HGB                      9.7 (L)              03/19/2023                HCT                      31.8 (L)            03/19/2023                MCV                      95.2                03/19/2023                PLT                      109 (L)             03/19/2023  Anesthesia Other Findings DNR  Last Eliquis: 03/13/2023  Reproductive/Obstetrics                              Anesthesia Physical Anesthesia Plan  ASA: 3  Anesthesia Plan: MAC   Post-op Pain Management:    Induction: Intravenous  PONV Risk Score and Plan: 1 and Propofol infusion, TIVA and Treatment may vary due to age or medical condition  Airway Management Planned: Natural Airway and Simple Face Mask  Additional Equipment:   Intra-op Plan:   Post-operative Plan:   Informed Consent:    Patient has DNR.   Dental advisory given  Plan Discussed with: CRNA and Anesthesiologist  Anesthesia Plan Comments: (Discussed with patient risks of MAC including, but not limited to, minor pain or discomfort, hearing people in the room, and possible need for backup general anesthesia. Risks for general anesthesia also discussed including, but not limited to, sore throat, hoarse voice, chipped/damaged teeth, injury to vocal cords, nausea and vomiting, allergic reactions, lung infection, heart attack, stroke, and death. All questions answered. )         Anesthesia Quick Evaluation

## 2023-03-19 NOTE — Progress Notes (Signed)
SLP Cancellation Note  Patient Details Name: Brian Burns MRN: [id number] DOB: Feb 08, 1945   Cancelled treatment:       Reason Eval/Treat Not Completed: Other (comment) (pt for endo today as po was not held yesterday per chart review)  Rolena Infante, MS [facility] SLP Acute Rehab Services Office [phone]   Chales Abrahams 03/19/2023, 7:52 AM

## 2023-03-19 NOTE — Op Note (Addendum)
[facility] Patient Name: Brian Burns Procedure Date: 03/19/2023 MRN: [id number] Attending MD: Lynann Bologna , MD, [id number] Date of Birth: [birth year] CSN: [id number] Age: 78 Admit Type: Outpatient Procedure:                Upper GI endoscopy Indications:              Dysphagia with weight loss and H/O aspiration. Providers:                Lynann Bologna, MD, Fransisca Connors, Rhodia Albright,                            Technician Referring MD:              Medicines:                Monitored Anesthesia Care Complications:            No immediate complications. Estimated Blood Loss:     Estimated blood loss: none. Procedure:                Pre-Anesthesia Assessment:                           - Prior to the procedure, a History and Physical                            was performed, and patient medications and                            allergies were reviewed. The patient's tolerance of                            previous anesthesia was also reviewed. The risks                            and benefits of the procedure and the sedation                            options and risks were discussed with the patient.                            All questions were answered, and informed consent                            was obtained. Prior Anticoagulants: The patient has                            taken Eliquis (apixaban), last dose was 4 days                            prior to procedure. ASA Grade Assessment: III - A                            patient with severe systemic disease. After  reviewing the risks and benefits, the patient was                            deemed in satisfactory condition to undergo the                            procedure.                           After obtaining informed consent, the endoscope was                            passed under direct vision. Throughout the                            procedure, the patient's blood  pressure, pulse, and                            oxygen saturations were monitored continuously. The                            [id number]-[id number] ([id number]) Olympus endoscope was introduced                            through the mouth, and advanced to the second part                            of duodenum. The upper GI endoscopy was                            accomplished without difficulty. The patient                            tolerated the procedure well. Scope In: Scope Out: Findings:      The lumen of the esophagus was severely dilated with fluid/food which       was successfully aspirated. Few whitish lesions were noted. Multiple       biopsies were taken with a cold forceps for histology.      One benign-appearing, intrinsic severe stenosis was found 43 cm from the       incisors at the GE junction. The scope was passed into stomach with a       "catch". A TTS dilator was passed through the scope. Dilation with a       12-13.5-15 mm balloon dilator was performed to 15 mm. The dilation site       was examined and showed mild mucosal disruption and moderate improvement       in luminal narrowing.      The entire examined stomach was normal. No fundal lesions or masses.      The examined duodenum was normal. Impression:               - EGD findings highly s/o achalasia. D/d severe                            esophageal dysmotility.                           -  No masses in the cardia/fundus. No pseudo                            achalasia. Moderate Sedation:      Not Applicable - Patient had care per Anesthesia. Recommendation:           - Full liquid diet. Can have Ensure 1 can p.o. 3                            times daily.                           - Continue present medications.                           - Await pathology results (r/o associated Candida).                           - Would recommend another attempt at esophageal                            manometry followed by definitive  treatment.                           - If possible, please get a good barium swallow                            this adm.                           - FU with Dr. Carren Rang as outpatient.                           - Please feed and give medications in sitting                            position with 8 ounces of water. Can resume                            Eliquis, if medically needed, from 2/21                           - The findings and recommendations were discussed                            with the patient's daughter Lynden Ang.                           Addendum: We are trying to schedule him for                            manometry tomorrow as inpatient. Please keep him                            n.p.o. after midnight until Endo unit gives [place] OK. Procedure Code(s):        ---  Professional ---                           [phone], Esophagogastroduodenoscopy, flexible,                            transoral; with transendoscopic balloon dilation of                            esophagus (less than 30 mm diameter)                           43239, 59, Esophagogastroduodenoscopy, flexible,                            transoral; with biopsy, single or multiple Diagnosis Code(s):        --- Professional ---                           K22.89, Other specified disease of esophagus                           K22.2, Esophageal obstruction                           R13.10, Dysphagia, unspecified CPT copyright 2022 American Medical Association. All rights reserved. The codes documented in this report are preliminary and upon coder review may  be revised to meet current compliance requirements. Lynann Bologna, MD 03/19/2023 1:30:25 PM This report has been signed electronically. Number of Addenda: 0

## 2023-03-19 NOTE — Transfer of Care (Signed)
Immediate Anesthesia Transfer of Care Note  Patient: Brian Burns  Procedure(s) Performed: ESOPHAGOGASTRODUODENOSCOPY (EGD) WITH PROPOFOL Balloon dilation wire-guided BIOPSY  Patient Location: Endoscopy Unit  Anesthesia Type:MAC  Level of Consciousness: drowsy  Airway & Oxygen Therapy: Patient Spontanous Breathing and Patient connected to face mask oxygen  Post-op Assessment: Report given to RN and Post -op Vital signs reviewed and stable  Post vital signs: Reviewed and stable  Last Vitals:  Vitals Value Taken Time  BP    Temp    Pulse 62 03/19/23 1320  Resp 26 03/19/23 1320  SpO2 100 % 03/19/23 1320  Vitals shown include unfiled device data.  Last Pain:  Vitals:   03/19/23 1220  TempSrc: Temporal  PainSc: 0-No pain         Complications: No notable events documented.

## 2023-03-19 NOTE — Plan of Care (Signed)
   Problem: Education: Goal: Knowledge of General Education information will improve Description Including pain rating scale, medication(s)/side effects and non-pharmacologic comfort measures Outcome: Progressing   Problem: Health Behavior/Discharge Planning: Goal: Ability to manage health-related needs will improve Outcome: Progressing

## 2023-03-19 NOTE — Progress Notes (Signed)
PROGRESS NOTE  Brian Burns  NFA:[id number] DOB: Aug 03, 1945 DOA: 03/14/2023 PCP: Pcp, No  Consultants  Brief Narrative: 78 y.o. male with medical history significant of type 2 diabetes, essential hypertension BPH, vascular dementia, chronic kidney disease stage III, GERD, paroxysmal atrial fibrillation, coronary artery disease, seizure disorder, bilateral hearing loss, esophageal dysphagia among other things he is a resident of skilled nursing facility and has recently been having poor oral intake nausea and vomiting.  Patient has also exhibited some choking sensation when he ate.  He has been losing weight lately.  Patient will be seen by GI in the outpatient setting with plan to do EGD colonoscopy.  Recent CT scan has shown proctitis.  Workup in the process.  Patient also had poor baseline with failure to thrive.  Literally bedridden most of the time.  In the ER he was found to have evidence of dehydration.  He is still vomiting in the ER.  Creatinine has increased from his baseline of 1.6-2.2.  Also BUN elevated.  Being admitted with intractable nausea vomiting and AKI      Assessment and Plan: * Acute kidney injury superimposed on stage 3a chronic kidney disease (HCC) - baseline SCr 1.6 -Deemed prerenal secondary to nausea vomiting and poor oral intake. -Acute kidney injury resolved.  He is now lower than his usual baseline creatinine   Oral phase dysphagia/adult failure to thrive: -GI consulted.  Appreciate input. -EGD pending for today.   Dementia, vascular (HCC) -Evidently this is worsening on an outpatient basis. -Fairly nonverbal other than asking for Pepsi today. -Agree with prior notes that PEG tube likely not beneficial for this patient.   Nausea 03-16-2023 resolved. Prn zofran.   History of anemia due to CKD -Hovering around his baseline hemoglobin of 9-10   DNR (do not resuscitate)/DNI(Do Not intubate) 03-16-2023 pt made DNR on arrival.   Seizure [facility]) -History of  the same.  No issues this admission.   Coronary artery disease involving native coronary artery of native heart without angina pectoris -Stable without any issues in house. -On statin outpatient.  Not on antiplatelet presumed because he is on long-acting apixaban.  Thrombocytopenia (HCC) -Stable.  Elevated on admission.  However his baseline appears to be in the mid 100s but as low as 90s going back to 2021.   Paroxysmal atrial fibrillation (HCC) -Stable.  Apixaban being held due to procedure.   BPH (benign prostatic hyperplasia) -On finasteride outpatient. -Last PSA was normal but this was back in 2015.   Hypertension associated with diabetes (HCC) -Blood pressure stable/borderline low off of his home blood pressure medications.     Type 2 diabetes mellitus with hypercholesterolemia (HCC) -On SSI.  No need for further insulin due to poor oral intake.     DVT prophylaxis:  SCDs   Code Status:   Code Status: Limited: Do not attempt resuscitation (DNR) -DNR-LIMITED -Do Not Intubate/DNI  Level of care: Med-Surg Status is: Inpatient Remains inpatient appropriate because: Awaiting EGD   Consults called: Gastroenterology  Subjective: Awake on my exam.  N.p.o. status.  He had no complaints other than perseveration on wanting a Pepsi to drink.  Unable to answer any other questions  Objective: Vitals:   03/19/23 1324 03/19/23 1327 03/19/23 1330 03/19/23 1340  BP: 120/72 105/64 109/66 (!) 116/54  Pulse: (!) 57 (!) 57 (!) 55 (!) 53  Resp: (!) 24 (!) 24 (!) 22 17  Temp:      TempSrc:      SpO2: 100% 100%  100% 98%  Weight:      Height:        Intake/Output Summary (Last 24 hours) at 03/19/2023 1347 Last data filed at 03/19/2023 1320 Gross per 24 hour  Intake 440 ml  Output 560 ml  Net -120 ml   Filed Weights   03/14/23 1542  Weight: 69.2 kg   Body mass index is 20.7 kg/m.  Gen: 78 y.o. male in no apparent distress.  Nontoxic Pulm: Non-labored breathing.  Clear to  auscultation bilaterally.  CV: Regular rate and rhythm. No murmur, rub, or gallop. No JVD GI: Abdomen soft, non-tender, non-distended, with normoactive bowel sounds. No organomegaly or masses felt. Ext: Warm, no deformities, no pedal edema Skin: No rashes, lesions  Neuro: Alert.No focal neurological deficits. Psych: Calm not oriented   I have personally reviewed the following labs and images: CBC: Recent Labs  Lab 03/14/23 1636 03/15/23 1109 03/16/23 0947 03/19/23 0456  WBC 5.2 5.3 3.7* 5.6  NEUTROABS 3.3  --   --  3.8  HGB 11.6* 10.2* 9.5* 9.7*  HCT 37.2* 33.2* 32.5* 31.8*  MCV 93.0 93.5 98.2 95.2  PLT 160 133* 106* 109*   BMP &GFR Recent Labs  Lab 03/14/23 1636 03/15/23 1109 03/16/23 0947 03/17/23 0448 03/19/23 0456  NA 137 139 141 141 140  K 4.3 3.6 4.2 4.0 3.7  CL 103 106 111 110 110  CO2 24 25 23 23 24   GLUCOSE 118* 109* 99 83 87  BUN 31* 25* 18 17 16   CREATININE 2.02* 1.61* 1.50* 1.44* 1.29*  CALCIUM 8.9 8.3* 8.2* 8.4* 8.5*   Estimated Creatinine Clearance: 47.7 mL/min (A) (by C-G formula based on SCr of 1.29 mg/dL (H)). Liver & Pancreas: Recent Labs  Lab 03/14/23 1636 03/15/23 1109 03/17/23 0448 03/19/23 0456  AST 27 25 23 22   ALT 19 17 17 17   ALKPHOS 89 76 69 73  BILITOT 1.0 0.7 0.8 0.5  PROT 7.9 6.8 6.4* 6.5  ALBUMIN 3.1* 2.7* 2.3* 2.6*   No results for input(s): "LIPASE", "AMYLASE" in the last 168 hours. No results for input(s): "AMMONIA" in the last 168 hours. Diabetic: No results for input(s): "HGBA1C" in the last 72 hours. No results for input(s): "GLUCAP" in the last 168 hours. Cardiac Enzymes: No results for input(s): "CKTOTAL", "CKMB", "CKMBINDEX", "TROPONINI" in the last 168 hours. No results for input(s): "PROBNP" in the last 8760 hours. Coagulation Profile: Recent Labs  Lab 03/17/23 1409  INR 1.3*   Thyroid Function Tests: No results for input(s): "TSH", "T4TOTAL", "FREET4", "T3FREE", "THYROIDAB" in the last 72 hours. Lipid  Profile: No results for input(s): "CHOL", "HDL", "LDLCALC", "TRIG", "CHOLHDL", "LDLDIRECT" in the last 72 hours. Anemia Panel: No results for input(s): "VITAMINB12", "FOLATE", "FERRITIN", "TIBC", "IRON", "RETICCTPCT" in the last 72 hours. Urine analysis:    Component Value Date/Time   COLORURINE AMBER (A) 05/18/2020 1702   APPEARANCEUR CLOUDY (A) 05/18/2020 1702   LABSPEC 1.027 05/18/2020 1702   PHURINE 5.0 05/18/2020 1702   GLUCOSEU NEGATIVE 05/18/2020 1702   HGBUR MODERATE (A) 05/18/2020 1702   HGBUR negative 06/19/2007 1522   BILIRUBINUR NEGATIVE 05/18/2020 1702   BILIRUBINUR negative 10/29/2018 0000   BILIRUBINUR SMALL 09/20/2014 1622   KETONESUR NEGATIVE 05/18/2020 1702   PROTEINUR 30 (A) 05/18/2020 1702   UROBILINOGEN 0.2 10/29/2018 0000   UROBILINOGEN 1.0 07/14/2017 1235   NITRITE POSITIVE (A) 05/18/2020 1702   LEUKOCYTESUR LARGE (A) 05/18/2020 1702   Sepsis Labs: Invalid input(s): "PROCALCITONIN", "LACTICIDVEN"  Microbiology: No results found for  this or any previous visit (from the past 240 hours).  Radiology Studies: No results found.  Scheduled Meds:  [MAR Hold] feeding supplement  237 mL Oral BID BM   [MAR Hold] pantoprazole  40 mg Oral Daily   Continuous Infusions:  lactated ringers 20 mL/hr at 03/19/23 1225   promethazine (PHENERGAN) injection (IM or IVPB)       LOS: 5 days   35 minutes with more than 50% spent in reviewing records, counseling patient/family and coordinating care.  Tobey Grim, MD [facility] [email or web address] 03/19/2023, 1:47 PM

## 2023-03-19 NOTE — Interval H&P Note (Signed)
History and Physical Interval Note:  03/19/2023 12:34 PM  Brian Burns  has presented today for surgery, with the diagnosis of Recurrent nausea and vomiting, dysphagia, esophageal motility disorder.  The various methods of treatment have been discussed with the patient and family. After consideration of risks, benefits and other options for treatment, the patient has consented to  Procedure(s) with comments: ESOPHAGOGASTRODUODENOSCOPY (EGD) WITH PROPOFOL (N/A) - possible esophageal dilation as a surgical intervention.  The patient's history has been reviewed, patient examined, no change in status, stable for surgery.  I have reviewed the patient's chart and labs.  Questions were answered to the patient's satisfaction.     Lynann Bologna

## 2023-03-19 NOTE — Anesthesia Procedure Notes (Signed)
Date/Time: 03/19/2023 12:48 PM  Performed by: Florene Route, CRNAOxygen Delivery Method: Simple face mask

## 2023-03-20 ENCOUNTER — Inpatient Hospital Stay (HOSPITAL_COMMUNITY): Payer: 59

## 2023-03-20 ENCOUNTER — Encounter (HOSPITAL_COMMUNITY)
Admission: EM | Disposition: A | Discharge status: Skilled Nursing Facility | Payer: Self-pay | Source: Skilled Nursing Facility | Attending: Family Medicine

## 2023-03-20 ENCOUNTER — Encounter (HOSPITAL_COMMUNITY): Payer: Self-pay | Admitting: Anesthesiology

## 2023-03-20 ENCOUNTER — Telehealth: Payer: Self-pay | Admitting: Gastroenterology

## 2023-03-20 DIAGNOSIS — R1310 Dysphagia, unspecified: Secondary | ICD-10-CM | POA: Diagnosis not present

## 2023-03-20 DIAGNOSIS — N1831 Chronic kidney disease, stage 3a: Secondary | ICD-10-CM | POA: Diagnosis not present

## 2023-03-20 DIAGNOSIS — K220 Achalasia of cardia: Secondary | ICD-10-CM | POA: Diagnosis not present

## 2023-03-20 DIAGNOSIS — K2289 Other specified disease of esophagus: Secondary | ICD-10-CM | POA: Diagnosis not present

## 2023-03-20 DIAGNOSIS — N179 Acute kidney failure, unspecified: Secondary | ICD-10-CM | POA: Diagnosis not present

## 2023-03-20 LAB — CBC
HCT: 30.5 % — ABNORMAL LOW (ref 39.0–52.0)
Hemoglobin: 9.3 g/dL — ABNORMAL LOW (ref 13.0–17.0)
MCH: 28.3 pg (ref 26.0–34.0)
MCHC: 30.5 g/dL (ref 30.0–36.0)
MCV: 92.7 fL (ref 80.0–100.0)
Platelets: 95 10*3/uL — ABNORMAL LOW (ref 150–400)
RBC: 3.29 MIL/uL — ABNORMAL LOW (ref 4.22–5.81)
RDW: 17.9 % — ABNORMAL HIGH (ref 11.5–15.5)
WBC: 6.1 10*3/uL (ref 4.0–10.5)
nRBC: 0 % (ref 0.0–0.2)

## 2023-03-20 LAB — SURGICAL PATHOLOGY

## 2023-03-20 LAB — BASIC METABOLIC PANEL
Anion gap: 11 (ref 5–15)
BUN: 16 mg/dL (ref 8–23)
CO2: 24 mmol/L (ref 22–32)
Calcium: 8.4 mg/dL — ABNORMAL LOW (ref 8.9–10.3)
Chloride: 104 mmol/L (ref 98–111)
Creatinine, Ser: 1.4 mg/dL — ABNORMAL HIGH (ref 0.61–1.24)
GFR, Estimated: 52 mL/min — ABNORMAL LOW (ref >60)
Glucose, Bld: 73 mg/dL (ref 70–99)
Potassium: 3.9 mmol/L (ref 3.5–5.1)
Sodium: 139 mmol/L (ref 135–145)

## 2023-03-20 SURGERY — ESOPHAGEAL MANOMETRY (EM)

## 2023-03-20 SURGERY — CANCELLED PROCEDURE
Anesthesia: Monitor Anesthesia Care

## 2023-03-20 SURGICAL SUPPLY — 2 items
FACESHIELD LNG OPTICON STERILE (SAFETY) IMPLANT
GLOVE BIO SURGEON STRL SZ8 (GLOVE) ×4 IMPLANT

## 2023-03-20 NOTE — Progress Notes (Signed)
SLP Cancellation Note  Patient Details Name: Brian Burns MRN: [id number] DOB: 1945/08/19   Cancelled treatment:       Reason Eval/Treat Not Completed: Other (comment) (pt currently npo for manometry per GI notes, will follow up next date)  Rolena Infante, MS [facility] SLP Acute Rehab Services Office [phone]   Chales Abrahams 03/20/2023, 7:54 AM

## 2023-03-20 NOTE — H&P (View-Only) (Signed)
Patient ID: Brian Burns, male   DOB: 12-22-1945, 78 y.o.   MRN: [id number]    Progress Note   Subjective   Day # 6 CC; severe dysphagia, oral and esophageal phase  EGD yesterday-lumen of esophagus severely dilated with fluid and food all successfully removed, few whitish lesions noted which were biopsied, there was 1 benign-appearing intrinsic severe stenosis at the GE junction scope was passed with a catch, then TTS dilated to 15 mm, dilation site reexamined and showed mild mucosal disruption on the stomach normal High suspicion for achalasia  Esophageal manometry this a.m.-patient refused to allow the tube to be placed.  Modified swallowing study per speech pathology 03/17/2023-mild to moderate oral dysphagia and moderate to severe pharyngeal esophageal dysphagia, had to cue the patient to swallow 1 instance of sensed aspiration, there was retrograde movement of the large amount of barium from the esophagus up through the PES into the pharynx, and deep penetration of the reflux barium observed-recommended dysphagia 1 diet  Patient currently comfortable back in his room 6 has had no that he would not try a barium swallow but when I explained this did not involve any tube placement he seems agreeable.  Repeatedly asking for Pepsi.   Objective   Vital signs in last 24 hours: Temp:  [97.4 F (36.3 C)-98.2 F (36.8 C)] 98.2 F (36.8 C) (02/20 0503) Pulse Rate:  [53-66] 63 (02/20 0503) Resp:  [13-28] 15 (02/20 0503) BP: (78-137)/(54-78) 98/73 (02/20 0503) SpO2:  [96 %-100 %] 98 % (02/20 0503)   General:   Elderly African-American male in NAD Heart:  Regular rate and rhythm; no murmurs Lungs: Respirations even and unlabored, lungs CTA bilaterally Abdomen:  Soft, nontender and nondistended. Normal bowel sounds. Extremities:  Without edema. Neurologic:  Alert and oriented x 1,  grossly normal neurologically. Psych:  Cooperative. Normal mood and affect.  Intake/Output from previous  day: 02/19 0701 - 02/20 0700 In: 680 [P.O.:480; I.V.:200] Out: 700 [Urine:700] Intake/Output this shift: No intake/output data recorded.  Lab Results: Recent Labs    03/19/23 0456 03/20/23 0442  WBC 5.6 6.1  HGB 9.7* 9.3*  HCT 31.8* 30.5*  PLT 109* 95*   BMET Recent Labs    03/19/23 0456 03/20/23 0442  NA 140 139  K 3.7 3.9  CL 110 104  CO2 24 24  GLUCOSE 87 73  BUN 16 16  CREATININE 1.29* 1.40*  CALCIUM 8.5* 8.4*   LFT Recent Labs    03/19/23 0456  PROT 6.5  ALBUMIN 2.6*  AST 22  ALT 17  ALKPHOS 73  BILITOT 0.5   PT/INR Recent Labs    03/17/23 1409  LABPROT 16.7*  INR 1.3*         Assessment / Plan:    #7 78 year old African-American male with vascular dementia, diabetes, hypertension, chronic kidney disease, atrial fibrillation, coronary artery disease, seizure disorder is a nursing home resident admitted with poor oral intake nausea vomiting and some episodes of choking.  Fairly extensive workup since admission shows that he has both oral pharyngeal and pharyngeal esophageal as well as esophageal dysphagia Speech path has recommended dysphagia 1 diet  EGD is very suspicious for achalasia, he did have a dilation of the LES yesterday Attempted manometry today but patient refused to allow the tube to be placed, and endoscopy nursing feel that his esophagus is so dilated that they may not be able to get good measurements at any rate.  Plan; We will proceed with regular barium swallow  today Okay for dysphagia 1 diet after barium swallow Further recommendations post barium swallow Keep him sitting upright for all meals and for 2 hours after meals at least elevated 60 degrees  Addendum-barium swallow today is consistent with achalasia showing a severely dilated esophagus with no progression of barium into the stomach seen after 5 minutes..  We will plan for EGD with Botox injection of the LES per Dr. Chales Abrahams tomorrow afternoon. I spoke with the  patient's daughter [name] this afternoon to update her and inform her of the procedure scheduled for tomorrow, and she will consent.   Principal Problem:   Acute kidney injury superimposed on stage 3a chronic kidney disease (HCC) - baseline SCr 1.6 Active Problems:   Type 2 diabetes mellitus with hypercholesterolemia (HCC)   Hypertension associated with diabetes (HCC)   BPH (benign prostatic hyperplasia)   GERD (gastroesophageal reflux disease)   CKD stage 3a, GFR 45-59 ml/min (HCC) - baseline scr 1.6   Paroxysmal atrial fibrillation (HCC)   Thrombocytopenia (HCC)   Coronary artery disease involving native coronary artery of native heart without angina pectoris   Dementia, vascular (HCC)   Seizure (HCC)   Nausea   DNR (do not resuscitate)/DNI(Do Not intubate)   Oral phase dysphagia   History of anemia due to CKD     LOS: 6 days   Amy EsterwoodPA-C  03/20/2023, 10:16 AM     Attending physician's note   I have taken history, reviewed the chart and examined the patient. I performed a substantive portion of this encounter, including complete performance of at least one of the key components, in conjunction with the APP. I agree with the Advanced Practitioner's note, impression and recommendations.   EGD highly s/o achalasia s/p dil 15 mm.  Did not tolerate manometry today (and in past). Eso Bx- P  Plan: -Ba swallow this adm -Aspiration precautions -Then, FU GI as outpt for further eval (empiric Botox vs pneumatic dil vs POEM) -Follow Bx. If + for Candida, treat with Diflucan.  Addendum-barium swallow consistent with achalasia For EGD with Botox prior to D/C Discussed with daughter   Edman Circle, MD [facility] [phone]

## 2023-03-20 NOTE — Progress Notes (Signed)
PROGRESS NOTE  Brian Burns  ZHY:[id number] DOB: 07-07-1945 DOA: 03/14/2023 PCP: Pcp, No  Consultants  Brief Narrative: 78 y.o. male with medical history significant of type 2 diabetes, essential hypertension BPH, vascular dementia, chronic kidney disease stage III, GERD, paroxysmal atrial fibrillation, coronary artery disease, seizure disorder, bilateral hearing loss, esophageal dysphagia among other things he is a resident of skilled nursing facility and has recently been having poor oral intake nausea and vomiting.  Patient has also exhibited some choking sensation when he ate.  He has been losing weight lately.  Patient will be seen by GI in the outpatient setting with plan to do EGD colonoscopy.  Recent CT scan has shown proctitis.  Workup in the process.  Patient also had poor baseline with failure to thrive.  Literally bedridden most of the time.  In the ER he was found to have evidence of dehydration.  He is still vomiting in the ER.  Creatinine has increased from his baseline of 1.6-2.2.  Also BUN elevated.  Being admitted with intractable nausea vomiting and AKI      Assessment and Plan: Oral phase dysphagia/adult failure to thrive: -Now main thing keeping him at the hospital - GI consulted.  Appreciate input. -EGD yesterday showed severely dilated luminal concerning for achalasia. -Manometry attempted this morning but patient refused to swallow tube.  -Plan now per GI's regular barium swallow today and dysphagia 1 diet afterwards.  Will need to remain upright for all meals and 2 hours after meals -Await results of barium swallow.    Acute kidney injury superimposed on stage 3a chronic kidney disease (HCC) - baseline SCr 1.6 -Deemed prerenal secondary to nausea vomiting and poor oral intake. -Acute kidney injury resolved.     Dementia, vascular (HCC) -Evidently this is worsening on an outpatient basis. -Fairly nonverbal other than asking for Pepsi today. -Agree with prior notes  that PEG tube likely not beneficial for this patient.   Nausea - resolved. Prn zofran.   History of anemia due to CKD -Hovering around his baseline hemoglobin of 9-10   Seizure (HCC) -History of the same.  No issues this admission. -Not on any antiseizure meds as on the hospital.  Sounds like it has been quite sometime since he had a seizure.   Coronary artery disease involving native coronary artery of native heart without angina pectoris -Stable without any issues in house. -On statin outpatient.  Not on antiplatelet presumed because he is on long-acting apixaban.  Thrombocytopenia (HCC) -Stable.  Elevated on admission.  However his baseline appears to be in the mid 100s but as low as 90s going back to 2021.   Paroxysmal atrial fibrillation (HCC) -Stable.  Apixaban being held due to procedure.   BPH (benign prostatic hyperplasia) -On finasteride outpatient. -Last PSA was normal but this was back in 2015.   Hypertension associated with diabetes (HCC) -Blood pressure stable/borderline low off of his home blood pressure medications.   Type 2 diabetes mellitus with hypercholesterolemia (HCC) -On SSI.  No need for further insulin due to poor oral intake.   DVT prophylaxis:  SCDs   Code Status:   Code Status: Limited: Do not attempt resuscitation (DNR) -DNR-LIMITED -Do Not Intubate/DNI  Level of care: Med-Surg Status is: Inpatient Remains inpatient appropriate because: Awaiting manometry results.   Consults called: Gastroenterology  Subjective: Awake on my exam.  Denied any pain.  He had no complaints other than perseveration on wanting a Pepsi to drink.    Objective: Vitals:  03/19/23 1351 03/19/23 1930 03/20/23 0503 03/20/23 1306  BP: 137/62 105/76 98/73 110/73  Pulse: (!) 53 (!) 59 63 (!) 52  Resp: 19 15 15 18   Temp:  98 F (36.7 C) 98.2 F (36.8 C) 98.2 F (36.8 C)  TempSrc:  Oral Oral Oral  SpO2: 97% 96% 98% 99%  Weight:      Height:         Intake/Output Summary (Last 24 hours) at 03/20/2023 1554 Last data filed at 03/20/2023 0500 Gross per 24 hour  Intake 480 ml  Output 700 ml  Net -220 ml   Filed Weights   03/14/23 1542  Weight: 69.2 kg   Body mass index is 20.7 kg/m.  Gen: 78 y.o. male in no apparent distress.  Nontoxic Pulm: Non-labored breathing.  Clear to auscultation bilaterally.  CV: Regular rate and rhythm. No murmur, rub, or gallop. No JVD GI: Abdomen soft, non-tender, non-distended, normal bowel sounds Ext: Warm, no deformities, no pedal edema Skin: No rashes, lesions  Neuro: Alert.  No focal neurological deficits. Psych: Calm not oriented   I have personally reviewed the following labs and images: CBC: Recent Labs  Lab 03/14/23 1636 03/15/23 1109 03/16/23 0947 03/19/23 0456 03/20/23 0442  WBC 5.2 5.3 3.7* 5.6 6.1  NEUTROABS 3.3  --   --  3.8  --   HGB 11.6* 10.2* 9.5* 9.7* 9.3*  HCT 37.2* 33.2* 32.5* 31.8* 30.5*  MCV 93.0 93.5 98.2 95.2 92.7  PLT 160 133* 106* 109* 95*   BMP &GFR Recent Labs  Lab 03/15/23 1109 03/16/23 0947 03/17/23 0448 03/19/23 0456 03/20/23 0442  NA 139 141 141 140 139  K 3.6 4.2 4.0 3.7 3.9  CL 106 111 110 110 104  CO2 25 23 23 24 24   GLUCOSE 109* 99 83 87 73  BUN 25* 18 17 16 16   CREATININE 1.61* 1.50* 1.44* 1.29* 1.40*  CALCIUM 8.3* 8.2* 8.4* 8.5* 8.4*   Estimated Creatinine Clearance: 43.9 mL/min (A) (by C-G formula based on SCr of 1.4 mg/dL (H)). Liver & Pancreas: Recent Labs  Lab 03/14/23 1636 03/15/23 1109 03/17/23 0448 03/19/23 0456  AST 27 25 23 22   ALT 19 17 17 17   ALKPHOS 89 76 69 73  BILITOT 1.0 0.7 0.8 0.5  PROT 7.9 6.8 6.4* 6.5  ALBUMIN 3.1* 2.7* 2.3* 2.6*   No results for input(s): "LIPASE", "AMYLASE" in the last 168 hours. No results for input(s): "AMMONIA" in the last 168 hours. Diabetic: No results for input(s): "HGBA1C" in the last 72 hours. No results for input(s): "GLUCAP" in the last 168 hours. Cardiac Enzymes: No  results for input(s): "CKTOTAL", "CKMB", "CKMBINDEX", "TROPONINI" in the last 168 hours. No results for input(s): "PROBNP" in the last 8760 hours. Coagulation Profile: Recent Labs  Lab 03/17/23 1409  INR 1.3*   Thyroid Function Tests: No results for input(s): "TSH", "T4TOTAL", "FREET4", "T3FREE", "THYROIDAB" in the last 72 hours. Lipid Profile: No results for input(s): "CHOL", "HDL", "LDLCALC", "TRIG", "CHOLHDL", "LDLDIRECT" in the last 72 hours. Anemia Panel: No results for input(s): "VITAMINB12", "FOLATE", "FERRITIN", "TIBC", "IRON", "RETICCTPCT" in the last 72 hours. Urine analysis:    Component Value Date/Time   COLORURINE AMBER (A) 05/18/2020 1702   APPEARANCEUR CLOUDY (A) 05/18/2020 1702   LABSPEC 1.027 05/18/2020 1702   PHURINE 5.0 05/18/2020 1702   GLUCOSEU NEGATIVE 05/18/2020 1702   HGBUR MODERATE (A) 05/18/2020 1702   HGBUR negative 06/19/2007 1522   BILIRUBINUR NEGATIVE 05/18/2020 1702   BILIRUBINUR negative 10/29/2018  0000   BILIRUBINUR SMALL 09/20/2014 1622   KETONESUR NEGATIVE 05/18/2020 1702   PROTEINUR 30 (A) 05/18/2020 1702   UROBILINOGEN 0.2 10/29/2018 0000   UROBILINOGEN 1.0 07/14/2017 1235   NITRITE POSITIVE (A) 05/18/2020 1702   LEUKOCYTESUR LARGE (A) 05/18/2020 1702   Sepsis Labs: Invalid input(s): "PROCALCITONIN", "LACTICIDVEN"  Microbiology: No results found for this or any previous visit (from the past 240 hours).  Radiology Studies: DG ESOPHAGUS W SINGLE CM (SOL OR THIN BA) Result Date: 03/20/2023 CLINICAL DATA:  Patient with history of dementia, CVA, GERD who was brought to the ED by family on 03/14/23 due to concerns for weight loss, poor oral intake, nausea and vomiting. EGD 03/19/23 showed severely dilated esophagus with fluid and food and severe stenosis at the GE junction s/p dilation to 15 mm. Request for timed esophagram due to concern for achalasia. EXAM: ESOPHAGUS/BARIUM SWALLOW/TABLET STUDY TECHNIQUE: Single contrast examination was  performed using thin liquid barium with images taken 1 minute, 2 minutes and 5 minutes after ingestion. This exam was performed by Lynnette Caffey, PA-C, and was supervised and interpreted by Jeronimo Greaves, MD. FLUOROSCOPY: Radiation Exposure Index (as provided by the fluoroscopic device): 12.90 mGy Kerma COMPARISON:  MBS 03/17/23, esophagram and MBS 05/19/20, esophagram 02/01/15. FINDINGS: Esophagus: Severely dilated and somewhat torturous esophagus. Images taken at 1 minute, 2 minutes and 5 minutes after ingestion of barium show no progression of barium from the lower esophagus into the stomach. Stasis of contrast noted in the mid esophagus with intermittent progression into the lower esophagus during the exam. Other: Patient unable to stand due to weakness or lay on his side due to pain, exam performed in AP and LPO with table tilted to 75 degrees. Difficulty drinking contrast due to nausea/gagging - ingested approximately 100 mL of barium over the course of 1 minute. IMPRESSION: Single contrast timed esophagram significant for severely dilated esophagus with no progression of barium into the stomach seen after 5 minutes. Findings highly suspicious for achalasia, given clinical history. The patient was instructed to remain upright in bed for at least 30 minutes. Electronically Signed   By: Jeronimo Greaves M.D.   On: 03/20/2023 15:00    Scheduled Meds:  Chlorhexidine Gluconate Cloth  6 each Topical Daily   feeding supplement  237 mL Oral BID BM   pantoprazole  40 mg Oral Daily   Continuous Infusions:  promethazine (PHENERGAN) injection (IM or IVPB)       LOS: 6 days   35 minutes with more than 50% spent in reviewing records, counseling patient/family and coordinating care.  Tobey Grim, MD [facility] [email or web address] 03/20/2023, 3:54 PM

## 2023-03-20 NOTE — Telephone Encounter (Signed)
Inbound call from patients daughter stating that patient is currently in the hospital and is unsure if he will be able to come to his appointment with Doug Sou on   2/25 at 11:00. She was advised to give [place] a call to discuss . Please advise.

## 2023-03-20 NOTE — Progress Notes (Addendum)
Patient ID: Brian Burns, male   DOB: 12-22-1945, 78 y.o.   MRN: [id number]    Progress Note   Subjective   Day # 6 CC; severe dysphagia, oral and esophageal phase  EGD yesterday-lumen of esophagus severely dilated with fluid and food all successfully removed, few whitish lesions noted which were biopsied, there was 1 benign-appearing intrinsic severe stenosis at the GE junction scope was passed with a catch, then TTS dilated to 15 mm, dilation site reexamined and showed mild mucosal disruption on the stomach normal High suspicion for achalasia  Esophageal manometry this a.m.-patient refused to allow the tube to be placed.  Modified swallowing study per speech pathology 03/17/2023-mild to moderate oral dysphagia and moderate to severe pharyngeal esophageal dysphagia, had to cue the patient to swallow 1 instance of sensed aspiration, there was retrograde movement of the large amount of barium from the esophagus up through the PES into the pharynx, and deep penetration of the reflux barium observed-recommended dysphagia 1 diet  Patient currently comfortable back in his room 6 has had no that he would not try a barium swallow but when I explained this did not involve any tube placement he seems agreeable.  Repeatedly asking for Pepsi.   Objective   Vital signs in last 24 hours: Temp:  [97.4 F (36.3 C)-98.2 F (36.8 C)] 98.2 F (36.8 C) (02/20 0503) Pulse Rate:  [53-66] 63 (02/20 0503) Resp:  [13-28] 15 (02/20 0503) BP: (78-137)/(54-78) 98/73 (02/20 0503) SpO2:  [96 %-100 %] 98 % (02/20 0503)   General:   Elderly African-American male in NAD Heart:  Regular rate and rhythm; no murmurs Lungs: Respirations even and unlabored, lungs CTA bilaterally Abdomen:  Soft, nontender and nondistended. Normal bowel sounds. Extremities:  Without edema. Neurologic:  Alert and oriented x 1,  grossly normal neurologically. Psych:  Cooperative. Normal mood and affect.  Intake/Output from previous  day: 02/19 0701 - 02/20 0700 In: 680 [P.O.:480; I.V.:200] Out: 700 [Urine:700] Intake/Output this shift: No intake/output data recorded.  Lab Results: Recent Labs    03/19/23 0456 03/20/23 0442  WBC 5.6 6.1  HGB 9.7* 9.3*  HCT 31.8* 30.5*  PLT 109* 95*   BMET Recent Labs    03/19/23 0456 03/20/23 0442  NA 140 139  K 3.7 3.9  CL 110 104  CO2 24 24  GLUCOSE 87 73  BUN 16 16  CREATININE 1.29* 1.40*  CALCIUM 8.5* 8.4*   LFT Recent Labs    03/19/23 0456  PROT 6.5  ALBUMIN 2.6*  AST 22  ALT 17  ALKPHOS 73  BILITOT 0.5   PT/INR Recent Labs    03/17/23 1409  LABPROT 16.7*  INR 1.3*         Assessment / Plan:    #7 78 year old African-American male with vascular dementia, diabetes, hypertension, chronic kidney disease, atrial fibrillation, coronary artery disease, seizure disorder is a nursing home resident admitted with poor oral intake nausea vomiting and some episodes of choking.  Fairly extensive workup since admission shows that he has both oral pharyngeal and pharyngeal esophageal as well as esophageal dysphagia Speech path has recommended dysphagia 1 diet  EGD is very suspicious for achalasia, he did have a dilation of the LES yesterday Attempted manometry today but patient refused to allow the tube to be placed, and endoscopy nursing feel that his esophagus is so dilated that they may not be able to get good measurements at any rate.  Plan; We will proceed with regular barium swallow  today Okay for dysphagia 1 diet after barium swallow Further recommendations post barium swallow Keep him sitting upright for all meals and for 2 hours after meals at least elevated 60 degrees  Addendum-barium swallow today is consistent with achalasia showing a severely dilated esophagus with no progression of barium into the stomach seen after 5 minutes..  We will plan for EGD with Botox injection of the LES per Dr. Chales Abrahams tomorrow afternoon. I spoke with the  patient's daughter [name] this afternoon to update her and inform her of the procedure scheduled for tomorrow, and she will consent.   Principal Problem:   Acute kidney injury superimposed on stage 3a chronic kidney disease (HCC) - baseline SCr 1.6 Active Problems:   Type 2 diabetes mellitus with hypercholesterolemia (HCC)   Hypertension associated with diabetes (HCC)   BPH (benign prostatic hyperplasia)   GERD (gastroesophageal reflux disease)   CKD stage 3a, GFR 45-59 ml/min (HCC) - baseline scr 1.6   Paroxysmal atrial fibrillation (HCC)   Thrombocytopenia (HCC)   Coronary artery disease involving native coronary artery of native heart without angina pectoris   Dementia, vascular (HCC)   Seizure (HCC)   Nausea   DNR (do not resuscitate)/DNI(Do Not intubate)   Oral phase dysphagia   History of anemia due to CKD     LOS: 6 days   Amy EsterwoodPA-C  03/20/2023, 10:16 AM     Attending physician's note   I have taken history, reviewed the chart and examined the patient. I performed a substantive portion of this encounter, including complete performance of at least one of the key components, in conjunction with the APP. I agree with the Advanced Practitioner's note, impression and recommendations.   EGD highly s/o achalasia s/p dil 15 mm.  Did not tolerate manometry today (and in past). Eso Bx- P  Plan: -Ba swallow this adm -Aspiration precautions -Then, FU GI as outpt for further eval (empiric Botox vs pneumatic dil vs POEM) -Follow Bx. If + for Candida, treat with Diflucan.  Addendum-barium swallow consistent with achalasia For EGD with Botox prior to D/C Discussed with daughter   Edman Circle, MD [facility] [phone]

## 2023-03-20 NOTE — Telephone Encounter (Signed)
Pt is currently admitted at [facility]. Daughter would like to know if they should keep OV scheduled for 2/25 with [place], [place] or if it needs to be rescheduled. Advised her I would reach out to our team at [facility] to advise on scheduling & will call her back.

## 2023-03-20 NOTE — Telephone Encounter (Signed)
Glend[place] Score the pt had an EGD with Dr Chales Abrahams yesterday.

## 2023-03-20 NOTE — Progress Notes (Signed)
Patient brought to endoscopy for esophageal manometry.  Patient alert and oriented x 3 (states name, date of birth and at [facility]).  Instructions given for esophageal manometry probe placement.  Patient shakes head no.  When asked if can proceed with placement probe patient shakes head no. Patient refused to have procedure. MD notified and returned to floor.

## 2023-03-20 NOTE — Anesthesia Postprocedure Evaluation (Signed)
Anesthesia Post Note  Patient: Brian Burns  Procedure(s) Performed: ESOPHAGOGASTRODUODENOSCOPY (EGD) WITH PROPOFOL Balloon dilation wire-guided BIOPSY     Patient location during evaluation: PACU Anesthesia Type: MAC Level of consciousness: awake and alert Pain management: pain level controlled Vital Signs Assessment: post-procedure vital signs reviewed and stable Respiratory status: spontaneous breathing, nonlabored ventilation and respiratory function stable Cardiovascular status: blood pressure returned to baseline and stable Postop Assessment: no apparent nausea or vomiting Anesthetic complications: no   No notable events documented.  Last Vitals:  Vitals:   03/19/23 1930 03/20/23 0503  BP: 105/76 98/73  Pulse: (!) 59 63  Resp: 15 15  Temp: 36.7 C 36.8 C  SpO2: 96% 98%    Last Pain:  Vitals:   03/20/23 0503  TempSrc: Oral  PainSc: Asleep                 Lowella Curb

## 2023-03-20 NOTE — Plan of Care (Signed)

## 2023-03-20 NOTE — Plan of Care (Signed)
   Problem: Education: Goal: Knowledge of General Education information will improve Description Including pain rating scale, medication(s)/side effects and non-pharmacologic comfort measures Outcome: Progressing   Problem: Health Behavior/Discharge Planning: Goal: Ability to manage health-related needs will improve Outcome: Progressing

## 2023-03-21 ENCOUNTER — Encounter (HOSPITAL_COMMUNITY): Payer: Self-pay | Admitting: Gastroenterology

## 2023-03-21 ENCOUNTER — Encounter (HOSPITAL_COMMUNITY)
Admission: EM | Disposition: A | Discharge status: Skilled Nursing Facility | Payer: Self-pay | Source: Skilled Nursing Facility | Attending: Family Medicine

## 2023-03-21 ENCOUNTER — Inpatient Hospital Stay (HOSPITAL_COMMUNITY): Payer: 59 | Admitting: Anesthesiology

## 2023-03-21 DIAGNOSIS — K222 Esophageal obstruction: Secondary | ICD-10-CM | POA: Diagnosis not present

## 2023-03-21 DIAGNOSIS — K2289 Other specified disease of esophagus: Secondary | ICD-10-CM

## 2023-03-21 DIAGNOSIS — N179 Acute kidney failure, unspecified: Secondary | ICD-10-CM | POA: Diagnosis not present

## 2023-03-21 DIAGNOSIS — I2510 Atherosclerotic heart disease of native coronary artery without angina pectoris: Secondary | ICD-10-CM

## 2023-03-21 DIAGNOSIS — E119 Type 2 diabetes mellitus without complications: Secondary | ICD-10-CM

## 2023-03-21 DIAGNOSIS — R1310 Dysphagia, unspecified: Secondary | ICD-10-CM

## 2023-03-21 DIAGNOSIS — K220 Achalasia of cardia: Secondary | ICD-10-CM | POA: Diagnosis not present

## 2023-03-21 DIAGNOSIS — N1831 Chronic kidney disease, stage 3a: Secondary | ICD-10-CM | POA: Diagnosis not present

## 2023-03-21 HISTORY — PX: BOTOX INJECTION: SHX5754

## 2023-03-21 HISTORY — PX: ESOPHAGOGASTRODUODENOSCOPY (EGD) WITH PROPOFOL: SHX5813

## 2023-03-21 SURGERY — ESOPHAGOGASTRODUODENOSCOPY (EGD) WITH PROPOFOL
Anesthesia: Monitor Anesthesia Care

## 2023-03-21 MED ORDER — LIDOCAINE 2% (20 MG/ML) 5 ML SYRINGE
INTRAMUSCULAR | Status: DC | PRN
  Administered 2023-03-21: 100 mg via INTRAVENOUS

## 2023-03-21 MED ORDER — SODIUM CHLORIDE (PF) 0.9 % IJ SOLN
INTRAMUSCULAR | Status: DC | PRN
  Administered 2023-03-21: 4 mL via SUBMUCOSAL

## 2023-03-21 MED ORDER — FLUCONAZOLE 200 MG PO TABS
200.0000 mg | ORAL_TABLET | Freq: Every day | ORAL | Status: DC
  Filled 2023-03-21: qty 1

## 2023-03-21 MED ORDER — PROPOFOL 500 MG/50ML IV EMUL
INTRAVENOUS | Status: DC | PRN
  Administered 2023-03-21: 80 ug/kg/min via INTRAVENOUS

## 2023-03-21 MED ORDER — PROPOFOL 10 MG/ML IV BOLUS
INTRAVENOUS | Status: DC | PRN
  Administered 2023-03-21: 30 mg via INTRAVENOUS

## 2023-03-21 MED ORDER — SODIUM CHLORIDE (PF) 0.9 % IJ SOLN
INTRAMUSCULAR | Status: AC
  Filled 2023-03-21: qty 10

## 2023-03-21 MED ORDER — ONABOTULINUMTOXINA 100 UNITS IJ SOLR
INTRAMUSCULAR | Status: AC
  Filled 2023-03-21: qty 100

## 2023-03-21 MED ORDER — LACTATED RINGERS IV SOLN: INTRAVENOUS | Status: DC | PRN

## 2023-03-21 SURGICAL SUPPLY — 14 items

## 2023-03-21 NOTE — Interval H&P Note (Signed)
History and Physical Interval Note:  03/21/2023 1:29 PM  Brian Burns  has presented today for surgery, with the diagnosis of Achalasia.  The various methods of treatment have been discussed with the patient and family. After consideration of risks, benefits and other options for treatment, the patient has consented to  Procedure(s) with comments: ESOPHAGOGASTRODUODENOSCOPY (EGD) WITH PROPOFOL (N/A) - with Botox as a surgical intervention.  The patient's history has been reviewed, patient examined, no change in status, stable for surgery.  I have reviewed the patient's chart and labs.  Questions were answered to the patient's satisfaction.     Lynann Bologna

## 2023-03-21 NOTE — Progress Notes (Signed)
Received report from [employer] Pt to return to [id number]

## 2023-03-21 NOTE — Telephone Encounter (Signed)
Provided daughter Lynden Ang with an update & advised her to keep appointment as scheduled for now, and that it may change but she will hear from either myself or Viviann Spare, Charity fundraiser.

## 2023-03-21 NOTE — Progress Notes (Signed)
SLP Cancellation Note  Patient Details Name: Brian Burns MRN: [id number] DOB: Aug 31, 1945   Cancelled treatment:       Reason Eval/Treat Not Completed: Other (comment). Pt is currently NPO for esophagram. Will continue efforts to follow up after MBS (completed 03/17/23).  Anum Palecek B. Murvin Natal, [facility], CCC-SLP Speech Language Pathologist  Leigh Aurora 03/21/2023, 11:44 AM

## 2023-03-21 NOTE — Anesthesia Postprocedure Evaluation (Signed)
Anesthesia Post Note  Patient: Brian Burns  Procedure(s) Performed: ESOPHAGOGASTRODUODENOSCOPY (EGD) WITH PROPOFOL BOTOX INJECTION     Patient location during evaluation: PACU Anesthesia Type: MAC Level of consciousness: awake and alert Pain management: pain level controlled Vital Signs Assessment: post-procedure vital signs reviewed and stable Respiratory status: spontaneous breathing, nonlabored ventilation, respiratory function stable and patient connected to nasal cannula oxygen Cardiovascular status: stable and blood pressure returned to baseline Postop Assessment: no apparent nausea or vomiting Anesthetic complications: no  There were no known notable events for this encounter.  Last Vitals:  Vitals:   03/21/23 1400 03/21/23 1420  BP: 103/65 117/66  Pulse: 63   Resp: (!) 35 17  Temp:    SpO2: 96%     Last Pain:  Vitals:   03/21/23 1357  TempSrc: (P) Temporal  PainSc: (P) Asleep                 Shelton Silvas

## 2023-03-21 NOTE — Progress Notes (Signed)
PROGRESS NOTE  Brian Burns  UJW:[id number] DOB: Nov 20, 1945 DOA: 03/14/2023 PCP: Pcp, No  Consultants  Brief Narrative: 78 y.o. male with medical history significant of type 2 diabetes, essential hypertension BPH, vascular dementia, chronic kidney disease stage III, GERD, paroxysmal atrial fibrillation, coronary artery disease, seizure disorder, bilateral hearing loss, esophageal dysphagia among other things he is a resident of skilled nursing facility and has recently been having poor oral intake nausea and vomiting.  Admitted for failure to thrive and found to have oral phase dysphagia.  EGD showed concern for achalasia.  Modified barium swallow followed which showed concern for the same.  On 2/21 patient had EGD with Botox injections.  Also found Candida on endoscopy.     Assessment and Plan: Oral phase dysphagia/adult failure to thrive: -EGD done today.  Technically successful procedure with Botox injected along GE junction. -Concern for Candida based on prior biopsies.  Starting Diflucan 40 mg p.o. x 1 followed by 200 mg daily for the next 2 weeks. -Can resume anticoagulation on 2/23. -Continue pured meals/dysphagia diet.   Candidiasis:  -Found on esophageal biopsies -Treatment as above.    Acute kidney injury superimposed on stage 3a chronic kidney disease (HCC) - baseline SCr 1.6 -Deemed prerenal secondary to nausea vomiting and poor oral intake. -Acute kidney injury resolved.  -Checking his creatinine/GFR tomorrow.  May need to adjust Diflucan of less than 50 creatinine clearance   Dementia, vascular (HCC) -Evidently this is worsening on an outpatient basis. -Fairly nonverbal other than asking for Pepsi today. -Agree with prior notes that PEG tube likely not beneficial for this patient.    History of anemia due to CKD -Hovering around his baseline hemoglobin of 9-10   Seizure (HCC) -History of the same.  No issues this admission. -Not on any antiseizure meds as on the  hospital.  Sounds like it has been quite sometime since he had a seizure.   Coronary artery disease involving native coronary artery of native heart without angina pectoris -Stable without any issues in house. -On statin outpatient.  Not on antiplatelet presumed because he is on long-acting apixaban.  Thrombocytopenia (HCC) -Stable.  Elevated on admission.  However his baseline appears to be in the mid 100s but as low as 90s going back to 2021.   Paroxysmal atrial fibrillation (HCC) -Stable.  Apixaban being held due to procedure.   BPH (benign prostatic hyperplasia) -On finasteride outpatient. -Last PSA was normal but this was back in 2015.   Hypertension associated with diabetes (HCC) -Blood pressure stable/borderline low off of his home blood pressure medications.   Type 2 diabetes mellitus with hypercholesterolemia (HCC) -On SSI.  No need for further insulin due to poor oral intake.   DVT prophylaxis:  SCDs   Code Status:   Code Status: Limited: Do not attempt resuscitation (DNR) -DNR-LIMITED -Do Not Intubate/DNI  Level of care: Med-Surg Status is: Inpatient Dispo:  now moving towards discharge back to facility.     Consults called: Gastroenterology  Subjective: Awake on my exam.  Denied any pain.  Seen more sleepy today than he has been.  He had no complaints   Objective: Vitals:   03/21/23 1357 03/21/23 1400 03/21/23 1420 03/21/23 1430  BP: (!) 84/53 103/65 117/66 112/65  Pulse: 60 63 76 (!) 55  Resp: 20 20 17 14   Temp: 97.9 F (36.6 C)     TempSrc: Temporal     SpO2: 94% 96% 97% 96%  Weight:      Height:  Intake/Output Summary (Last 24 hours) at 03/21/2023 1519 Last data filed at 03/21/2023 1350 Gross per 24 hour  Intake 900 ml  Output 400 ml  Net 500 ml   Filed Weights   03/14/23 1542 03/21/23 1232  Weight: 69.2 kg 69.2 kg   Body mass index is 20.69 kg/m.  Gen: 78 y.o. male in no apparent distress.  Nontoxic.  Mostly edentulous.  Pulm:  Non-labored breathing.  Clear to auscultation bilaterally.  CV: Regular rate and rhythm. No murmur, rub, or gallop. No JVD GI: Abdomen soft, non-tender, non-distended, normal bowel sounds Ext: Warm, no deformities, no pedal edema Skin: No rashes, lesions  Neuro: Alert.  No focal neurological deficits. Psych: Calm not oriented   I have personally reviewed the following labs and images: CBC: Recent Labs  Lab 03/14/23 1636 03/15/23 1109 03/16/23 0947 03/19/23 0456 03/20/23 0442  WBC 5.2 5.3 3.7* 5.6 6.1  NEUTROABS 3.3  --   --  3.8  --   HGB 11.6* 10.2* 9.5* 9.7* 9.3*  HCT 37.2* 33.2* 32.5* 31.8* 30.5*  MCV 93.0 93.5 98.2 95.2 92.7  PLT 160 133* 106* 109* 95*   BMP &GFR Recent Labs  Lab 03/15/23 1109 03/16/23 0947 03/17/23 0448 03/19/23 0456 03/20/23 0442  NA 139 141 141 140 139  K 3.6 4.2 4.0 3.7 3.9  CL 106 111 110 110 104  CO2 25 23 23 24 24   GLUCOSE 109* 99 83 87 73  BUN 25* 18 17 16 16   CREATININE 1.61* 1.50* 1.44* 1.29* 1.40*  CALCIUM 8.3* 8.2* 8.4* 8.5* 8.4*   Estimated Creatinine Clearance: 43.9 mL/min (A) (by C-G formula based on SCr of 1.4 mg/dL (H)). Liver & Pancreas: Recent Labs  Lab 03/14/23 1636 03/15/23 1109 03/17/23 0448 03/19/23 0456  AST 27 25 23 22   ALT 19 17 17 17   ALKPHOS 89 76 69 73  BILITOT 1.0 0.7 0.8 0.5  PROT 7.9 6.8 6.4* 6.5  ALBUMIN 3.1* 2.7* 2.3* 2.6*   No results for input(s): "LIPASE", "AMYLASE" in the last 168 hours. No results for input(s): "AMMONIA" in the last 168 hours. Diabetic: No results for input(s): "HGBA1C" in the last 72 hours. No results for input(s): "GLUCAP" in the last 168 hours. Cardiac Enzymes: No results for input(s): "CKTOTAL", "CKMB", "CKMBINDEX", "TROPONINI" in the last 168 hours. No results for input(s): "PROBNP" in the last 8760 hours. Coagulation Profile: Recent Labs  Lab 03/17/23 1409  INR 1.3*   Thyroid Function Tests: No results for input(s): "TSH", "T4TOTAL", "FREET4", "T3FREE",  "THYROIDAB" in the last 72 hours. Lipid Profile: No results for input(s): "CHOL", "HDL", "LDLCALC", "TRIG", "CHOLHDL", "LDLDIRECT" in the last 72 hours. Anemia Panel: No results for input(s): "VITAMINB12", "FOLATE", "FERRITIN", "TIBC", "IRON", "RETICCTPCT" in the last 72 hours. Urine analysis:    Component Value Date/Time   COLORURINE AMBER (A) 05/18/2020 1702   APPEARANCEUR CLOUDY (A) 05/18/2020 1702   LABSPEC 1.027 05/18/2020 1702   PHURINE 5.0 05/18/2020 1702   GLUCOSEU NEGATIVE 05/18/2020 1702   HGBUR MODERATE (A) 05/18/2020 1702   HGBUR negative 06/19/2007 1522   BILIRUBINUR NEGATIVE 05/18/2020 1702   BILIRUBINUR negative 10/29/2018 0000   BILIRUBINUR SMALL 09/20/2014 1622   KETONESUR NEGATIVE 05/18/2020 1702   PROTEINUR 30 (A) 05/18/2020 1702   UROBILINOGEN 0.2 10/29/2018 0000   UROBILINOGEN 1.0 07/14/2017 1235   NITRITE POSITIVE (A) 05/18/2020 1702   LEUKOCYTESUR LARGE (A) 05/18/2020 1702   Sepsis Labs: Invalid input(s): "PROCALCITONIN", "LACTICIDVEN"  Microbiology: No results found for this or any  previous visit (from the past 240 hours).  Radiology Studies: No results found.   Scheduled Meds:  Chlorhexidine Gluconate Cloth  6 each Topical Daily   feeding supplement  237 mL Oral BID BM   pantoprazole  40 mg Oral Daily   Continuous Infusions:  promethazine (PHENERGAN) injection (IM or IVPB)       LOS: 7 days   35 minutes with more than 50% spent in reviewing records, counseling patient/family and coordinating care.  Tobey Grim, MD [facility] [email or web address] 03/21/2023, 3:19 PM

## 2023-03-21 NOTE — Telephone Encounter (Signed)
Daughter made aware of recommendations & cancelled appointment for next week.

## 2023-03-21 NOTE — Transfer of Care (Signed)
Immediate Anesthesia Transfer of Care Note  Patient: Brian Burns  Procedure(s) Performed: ESOPHAGOGASTRODUODENOSCOPY (EGD) WITH PROPOFOL BOTOX INJECTION  Patient Location: Endoscopy Unit  Anesthesia Type:MAC  Level of Consciousness: sedated, patient cooperative, and responds to stimulation  Airway & Oxygen Therapy: Patient Spontanous Breathing and Patient connected to nasal cannula oxygen  Post-op Assessment: Report given to RN and Post -op Vital signs reviewed and stable  Post vital signs: Reviewed and stable  Last Vitals:  Vitals Value Taken Time  BP 103/65 03/21/23 1400  Temp    Pulse 59 03/21/23 1403  Resp 25 03/21/23 1403  SpO2 97 % 03/21/23 1403  Vitals shown include unfiled device data.  Last Pain:  Vitals:   03/21/23 1232  TempSrc: Tympanic  PainSc: 0-No pain         Complications: No notable events documented.

## 2023-03-21 NOTE — Anesthesia Preprocedure Evaluation (Signed)
Anesthesia Evaluation    Reviewed: Allergy & Precautions, Patient's Chart, lab work & pertinent test results  Airway Mallampati: Unable to assess       Dental  (+) Edentulous Upper, Edentulous Lower   Pulmonary former smoker   breath sounds clear to auscultation       Cardiovascular hypertension, + CAD and + Peripheral Vascular Disease  + dysrhythmias Atrial Fibrillation  Rhythm:Regular Rate:Normal     Neuro/Psych  Headaches, Seizures -,  PSYCHIATRIC DISORDERS Anxiety Depression   Dementia  Neuromuscular disease    GI/Hepatic Neg liver ROS,GERD  Medicated,,  Endo/Other  diabetes, Type 2    Renal/GU Renal InsufficiencyRenal disease     Musculoskeletal  (+) Arthritis ,    Abdominal   Peds  Hematology  (+) Blood dyscrasia, anemia   Anesthesia Other Findings   Reproductive/Obstetrics                             Anesthesia Physical Anesthesia Plan  ASA: 3  Anesthesia Plan: MAC   Post-op Pain Management: Minimal or no pain anticipated   Induction: Intravenous  PONV Risk Score and Plan: 0 and Propofol infusion  Airway Management Planned: Natural Airway  Additional Equipment: None  Intra-op Plan:   Post-operative Plan:   Informed Consent: I have reviewed the patients History and Physical, chart, labs and discussed the procedure including the risks, benefits and alternatives for the proposed anesthesia with the patient or authorized representative who has indicated his/her understanding and acceptance.   Patient has DNR.  Discussed DNR with power of attorney.   Consent reviewed with POA  Plan Discussed with: CRNA  Anesthesia Plan Comments:        Anesthesia Quick Evaluation

## 2023-03-21 NOTE — Op Note (Signed)
[facility] Patient Name: Brian Burns Procedure Date: 03/21/2023 MRN: [id number] Attending MD: Lynann Bologna , MD, [id number] Date of Birth: 1945/03/23 CSN: [id number] Age: 78 Admit Type: Inpatient Procedure:                Upper GI endoscopy Indications:              Dysphagia with Ba swallow findings consistent with                            achalasia. He could not tolerate manometry. Providers:                Lynann Bologna, MD, Jacquelyn "Jaci" Clelia Croft, RN, Harrington Challenger, Technician Referring MD:              Medicines:                Monitored Anesthesia Care Complications:            No immediate complications. Estimated Blood Loss:     Estimated blood loss: none. Procedure:                Pre-Anesthesia Assessment:                           - Prior to the procedure, a History and Physical                            was performed, and patient medications and                            allergies were reviewed. The patient's tolerance of                            previous anesthesia was also reviewed. The risks                            and benefits of the procedure and the sedation                            options and risks were discussed with the patient.                            All questions were answered, and informed consent                            was obtained. Prior Anticoagulants: Eliquis was                            held previously.. ASA Grade Assessment: III - A                            patient with severe systemic disease. After  reviewing the risks and benefits, the patient was                            deemed in satisfactory condition to undergo the                            procedure.                           After obtaining informed consent, the endoscope was                            passed under direct vision. Throughout the                            procedure, the patient's blood  pressure, pulse, and                            oxygen saturations were monitored continuously. The                            [id number]-[id number] ([id number]) Olympus endoscope was introduced                            through the mouth, and advanced to the second part                            of duodenum. The upper GI endoscopy was                            accomplished without difficulty. The patient                            tolerated the procedure well. Scope In: Scope Out: Findings:      For details see EGD note from 03/19/2023. The lumen of the esophagus was       severely dilated with fluid which was successfully aspirated. EGD       findings consistent with achalasia. One benign- appearing, intrinsic       severe stenosis was found 43 cm from the incisors at the GE junction.       The scope was passed into stomach with a " catch". Distal esophagus was       successfully injected with 25U/ml botulinum toxin, 1 cm above the GE       junction in all 4 quadrants (total of 100U).      The entire examined stomach was normal. No evidence of pseudo achalasia.      The examined duodenum was normal. Impression:               - Achalasia s/p Botox.                           - Normal stomach.                           - Normal examined duodenum.                           -  No specimens collected. Moderate Sedation:      Not Applicable - Patient had care per Anesthesia. Recommendation:           - Patient has a contact number available for                            emergencies. The signs and symptoms of potential                            delayed complications were discussed with the                            patient. Return to normal activities tomorrow.                            Written discharge instructions were provided to the                            patient.                           - Full liquid diet. Advance diet as tolerated.                            Patient at very high risk for  food impaction.                            Hence, pure all meats. Feed and give medications                            in sitting position                           - Continue present medications.                           - If Eliquis is needed, can resume 2/23                           - Previous EGD biopsies were consistent with                            Candida. Recommend starting Diflucan 400 mg p.o. x                            1 followed by 200 mg p.o. daily x 2 weeks. Please                            check for any interactions.                           - FU GI in 3-4 months or PRN (due to difficulty in  transportation/dementia)                           - Will sign off for now.                           - D/W daughter                           - The findings and recommendations were discussed                            with the patient's family. Procedure Code(s):        --- Professional ---                           [phone], Esophagogastroduodenoscopy, flexible,                            transoral; with directed submucosal injection(s),                            any substance Diagnosis Code(s):        --- Professional ---                           K22.89, Other specified disease of esophagus                           R13.10, Dysphagia, unspecified CPT copyright 2022 [employer]. All rights reserved. The codes documented in this report are preliminary and upon coder review may  be revised to meet current compliance requirements. Lynann Bologna, MD 03/21/2023 2:06:57 PM This report has been signed electronically. Number of Addenda: 0

## 2023-03-22 DIAGNOSIS — B3781 Candidal esophagitis: Secondary | ICD-10-CM | POA: Diagnosis not present

## 2023-03-22 DIAGNOSIS — N1831 Chronic kidney disease, stage 3a: Secondary | ICD-10-CM | POA: Diagnosis not present

## 2023-03-22 DIAGNOSIS — F0150 Vascular dementia without behavioral disturbance: Secondary | ICD-10-CM | POA: Diagnosis not present

## 2023-03-22 DIAGNOSIS — N179 Acute kidney failure, unspecified: Secondary | ICD-10-CM | POA: Diagnosis not present

## 2023-03-22 LAB — BASIC METABOLIC PANEL
Anion gap: 9 (ref 5–15)
BUN: 16 mg/dL (ref 8–23)
CO2: 25 mmol/L (ref 22–32)
Calcium: 8 mg/dL — ABNORMAL LOW (ref 8.9–10.3)
Chloride: 103 mmol/L (ref 98–111)
Creatinine, Ser: 1.17 mg/dL (ref 0.61–1.24)
GFR, Estimated: >60 mL/min (ref >60)
Glucose, Bld: 93 mg/dL (ref 70–99)
Potassium: 3.4 mmol/L — ABNORMAL LOW (ref 3.5–5.1)
Sodium: 137 mmol/L (ref 135–145)

## 2023-03-22 LAB — CBC
HCT: 29.4 % — ABNORMAL LOW (ref 39.0–52.0)
Hemoglobin: 9.2 g/dL — ABNORMAL LOW (ref 13.0–17.0)
MCH: 28.8 pg (ref 26.0–34.0)
MCHC: 31.3 g/dL (ref 30.0–36.0)
MCV: 92.2 fL (ref 80.0–100.0)
Platelets: 95 10*3/uL — ABNORMAL LOW (ref 150–400)
RBC: 3.19 MIL/uL — ABNORMAL LOW (ref 4.22–5.81)
RDW: 17.6 % — ABNORMAL HIGH (ref 11.5–15.5)
WBC: 7.2 10*3/uL (ref 4.0–10.5)
nRBC: 0 % (ref 0.0–0.2)

## 2023-03-22 MED ORDER — POTASSIUM CHLORIDE CRYS ER 20 MEQ PO TBCR
40.0000 meq | EXTENDED_RELEASE_TABLET | Freq: Once | ORAL | Status: AC
  Administered 2023-03-23: 40 meq via ORAL
  Filled 2023-03-22: qty 2

## 2023-03-22 MED ORDER — ACETAMINOPHEN 325 MG PO TABS
650.0000 mg | ORAL_TABLET | Freq: Four times a day (QID) | ORAL | Status: DC | PRN
Start: 2023-03-22 — End: 2023-03-25
  Administered 2023-03-22 – 2023-03-25 (×4): 650 mg via ORAL
  Filled 2023-03-22 (×4): qty 2

## 2023-03-22 MED ORDER — FLUCONAZOLE 200 MG PO TABS
200.0000 mg | ORAL_TABLET | Freq: Every day | ORAL | Status: DC
  Administered 2023-03-23 – 2023-03-25 (×3): 200 mg via ORAL
  Filled 2023-03-22 (×4): qty 1

## 2023-03-22 MED ORDER — FLUCONAZOLE 200 MG PO TABS
400.0000 mg | ORAL_TABLET | Freq: Once | ORAL | Status: DC
  Filled 2023-03-22: qty 2

## 2023-03-22 NOTE — Progress Notes (Signed)
 PROGRESS NOTE  MARJORIE LUSSIER  NGE:[id number] DOB: August 16, 1945 DOA: 03/14/2023 PCP: Pcp, No  Consultants  Brief Narrative: 78 y.o. male with medical history significant of type 2 diabetes, essential hypertension BPH, vascular dementia, chronic kidney disease stage III, GERD, paroxysmal atrial fibrillation, coronary artery disease, seizure disorder, bilateral hearing loss, esophageal dysphagia among other things he is a resident of skilled nursing facility and has recently been having poor oral intake nausea and vomiting.  Admitted for failure to thrive and found to have oral phase dysphagia.  EGD showed concern for achalasia.  Modified barium swallow followed which showed concern for the same.  On 2/21 patient had EGD with Botox injections.  Also found Candida on endoscopy, being treated for the same.  Now awaiting placement back to facility.       Assessment and Plan: Oral phase dysphagia/adult failure to thrive: -S/p EGD 2/21.  Technically successful procedure with Botox injected along GE junction. -Concern for Candida based on prior biopsies.  Starting Diflucan 40 mg p.o. x 1 followed by 200 mg daily for the next 2 weeks. - Appreciate GI input.   -Can resume anticoagulation on 2/23. -Continue pured meals/dysphagia diet.  - overall improving well   Candidiasis:  -Found on esophageal biopsies -Treatment as above.    Acute kidney injury superimposed on stage 3a chronic kidney disease (HCC) - baseline SCr 1.6 -Deemed prerenal secondary to nausea vomiting and poor oral intake. -Acute kidney injury resolved.  -Checking his creatinine/GFR tomorrow.  May need to adjust Diflucan if less than 50 creatinine clearance  Hypokalemia:  - replaced K+ today    Dementia, vascular (HCC) -Evidently this is worsening on an outpatient basis. -Fairly nonverbal other than asking for Pepsi today. -Agree with prior notes that PEG tube likely not beneficial for this patient.   History of anemia due to  CKD -Hovering around his baseline hemoglobin of 9-10   Seizure (HCC) -History of the same.  No issues this admission. -Not on any antiseizure meds as on the hospital.  Sounds like it has been quite sometime since he had a seizure.   Coronary artery disease involving native coronary artery of native heart without angina pectoris -Stable without any issues in house. -On statin outpatient.  Not on antiplatelet presumed because he is on long-acting apixaban.  Thrombocytopenia (HCC) -Stable.  Elevated on admission.  However his baseline appears to be in the mid 100s but as low as 90s going back to 2021.   Paroxysmal atrial fibrillation (HCC) -Stable.  Apixaban being held due to procedure.   BPH (benign prostatic hyperplasia) -On finasteride outpatient. -Last PSA was normal but this was back in 2015.   Hypertension associated with diabetes (HCC) -Blood pressure stable/borderline low off of his home blood pressure medications.   Type 2 diabetes mellitus with hypercholesterolemia (HCC) -On SSI.  No need for further insulin due to poor oral intake.   DVT prophylaxis:  SCDs   Code Status:   Code Status: Limited: Do not attempt resuscitation (DNR) -DNR-LIMITED -Do Not Intubate/DNI  Level of care: Med-Surg Status is: Inpatient Dispo:  now medically stable for discharge.    Consults called: Gastroenterology  Subjective: Awake on my exam.  Denied any pain.  No complaints.  Back to asking for a Pepsi.    Objective: Vitals:   03/21/23 1430 03/21/23 1933 03/22/23 0448 03/22/23 1235  BP: 112/65 119/72 100/69 92/64  Pulse: (!) 55 (!) 55 (!) 58 76  Resp: 14 14 14 18   Temp:  97.6 F (36.4 C) (!) 97.5 F (36.4 C) 98.1 F (36.7 C)  TempSrc:  Oral  Oral  SpO2: 96% 98% 97% 100%  Weight:      Height:        Intake/Output Summary (Last 24 hours) at 03/22/2023 1426 Last data filed at 03/22/2023 1300 Gross per 24 hour  Intake 315 ml  Output 450 ml  Net -135 ml   Filed Weights    03/14/23 1542 03/21/23 1232  Weight: 69.2 kg 69.2 kg   Body mass index is 20.69 kg/m.  Gen: 78 y.o. male in no apparent distress.  Nontoxic.  Mostly edentulous.  Pulm: Non-labored breathing.  Clear to auscultation bilaterally.  CV: Regular rate and rhythm. No murmur, rub, or gallop. No JVD GI: Abdomen soft, non-tender, non-distended, normal bowel sounds Ext: Warm, no deformities, no pedal edema Skin: No rashes, lesions  Neuro: Alert.  No focal neurological deficits. Psych: Calm not oriented   I have personally reviewed the following labs and images: CBC: Recent Labs  Lab 03/16/23 0947 03/19/23 0456 03/20/23 0442 03/22/23 0533  WBC 3.7* 5.6 6.1 7.2  NEUTROABS  --  3.8  --   --   HGB 9.5* 9.7* 9.3* 9.2*  HCT 32.5* 31.8* 30.5* 29.4*  MCV 98.2 95.2 92.7 92.2  PLT 106* 109* 95* 95*   BMP &GFR Recent Labs  Lab 03/16/23 0947 03/17/23 0448 03/19/23 0456 03/20/23 0442 03/22/23 0533  NA 141 141 140 139 137  K 4.2 4.0 3.7 3.9 3.4*  CL 111 110 110 104 103  CO2 23 23 24 24 25   GLUCOSE 99 83 87 73 93  BUN 18 17 16 16 16   CREATININE 1.50* 1.44* 1.29* 1.40* 1.17  CALCIUM 8.2* 8.4* 8.5* 8.4* 8.0*   Estimated Creatinine Clearance: 52.6 mL/min (by C-G formula based on SCr of 1.17 mg/dL). Liver & Pancreas: Recent Labs  Lab 03/17/23 0448 03/19/23 0456  AST 23 22  ALT 17 17  ALKPHOS 69 73  BILITOT 0.8 0.5  PROT 6.4* 6.5  ALBUMIN 2.3* 2.6*   No results for input(s): "LIPASE", "AMYLASE" in the last 168 hours. No results for input(s): "AMMONIA" in the last 168 hours. Diabetic: No results for input(s): "HGBA1C" in the last 72 hours. No results for input(s): "GLUCAP" in the last 168 hours. Cardiac Enzymes: No results for input(s): "CKTOTAL", "CKMB", "CKMBINDEX", "TROPONINI" in the last 168 hours. No results for input(s): "PROBNP" in the last 8760 hours. Coagulation Profile: Recent Labs  Lab 03/17/23 1409  INR 1.3*   Thyroid Function Tests: No results for input(s):  "TSH", "T4TOTAL", "FREET4", "T3FREE", "THYROIDAB" in the last 72 hours. Lipid Profile: No results for input(s): "CHOL", "HDL", "LDLCALC", "TRIG", "CHOLHDL", "LDLDIRECT" in the last 72 hours. Anemia Panel: No results for input(s): "VITAMINB12", "FOLATE", "FERRITIN", "TIBC", "IRON", "RETICCTPCT" in the last 72 hours. Urine analysis:    Component Value Date/Time   COLORURINE AMBER (A) 05/18/2020 1702   APPEARANCEUR CLOUDY (A) 05/18/2020 1702   LABSPEC 1.027 05/18/2020 1702   PHURINE 5.0 05/18/2020 1702   GLUCOSEU NEGATIVE 05/18/2020 1702   HGBUR MODERATE (A) 05/18/2020 1702   HGBUR negative 06/19/2007 1522   BILIRUBINUR NEGATIVE 05/18/2020 1702   BILIRUBINUR negative 10/29/2018 0000   BILIRUBINUR SMALL 09/20/2014 1622   KETONESUR NEGATIVE 05/18/2020 1702   PROTEINUR 30 (A) 05/18/2020 1702   UROBILINOGEN 0.2 10/29/2018 0000   UROBILINOGEN 1.0 07/14/2017 1235   NITRITE POSITIVE (A) 05/18/2020 1702   LEUKOCYTESUR LARGE (A) 05/18/2020 1702   Sepsis  Labs: Invalid input(s): "PROCALCITONIN", "LACTICIDVEN"  Microbiology: No results found for this or any previous visit (from the past 240 hours).  Radiology Studies: No results found.   Scheduled Meds:  feeding supplement  237 mL Oral BID BM   [START ON 03/23/2023] fluconazole  200 mg Oral Daily   fluconazole  400 mg Oral Once   pantoprazole  40 mg Oral Daily   potassium chloride  40 mEq Oral Once   Continuous Infusions:  promethazine (PHENERGAN) injection (IM or IVPB)       LOS: 8 days   35 minutes with more than 50% spent in reviewing records, counseling patient/family and coordinating care.  Tobey Grim, MD [facility] [email or web address] 03/22/2023, 2:26 PM

## 2023-03-22 NOTE — Progress Notes (Signed)
 Notified MD pt refused all morning meds.  Attempted 3 times

## 2023-03-22 NOTE — Progress Notes (Signed)
 Pt refused all po intake including ordered morning medications,  I attempted to educate and tried a small bite of applesauce first, pt waved me away nodding "no" and then stated "no, I'm fine."

## 2023-03-23 DIAGNOSIS — N179 Acute kidney failure, unspecified: Secondary | ICD-10-CM | POA: Diagnosis not present

## 2023-03-23 DIAGNOSIS — N1831 Chronic kidney disease, stage 3a: Secondary | ICD-10-CM | POA: Diagnosis not present

## 2023-03-23 DIAGNOSIS — K220 Achalasia of cardia: Secondary | ICD-10-CM

## 2023-03-23 LAB — BASIC METABOLIC PANEL
Anion gap: 8 (ref 5–15)
BUN: 15 mg/dL (ref 8–23)
CO2: 26 mmol/L (ref 22–32)
Calcium: 8.2 mg/dL — ABNORMAL LOW (ref 8.9–10.3)
Chloride: 103 mmol/L (ref 98–111)
Creatinine, Ser: 1.09 mg/dL (ref 0.61–1.24)
GFR, Estimated: >60 mL/min (ref >60)
Glucose, Bld: 94 mg/dL (ref 70–99)
Potassium: 3.1 mmol/L — ABNORMAL LOW (ref 3.5–5.1)
Sodium: 137 mmol/L (ref 135–145)

## 2023-03-23 MED ORDER — CHLORHEXIDINE GLUCONATE CLOTH 2 % EX PADS
6.0000 | MEDICATED_PAD | Freq: Every day | CUTANEOUS | Status: DC
  Administered 2023-03-23 – 2023-03-25 (×3): 6 via TOPICAL

## 2023-03-23 MED ORDER — POTASSIUM CHLORIDE CRYS ER 20 MEQ PO TBCR
40.0000 meq | EXTENDED_RELEASE_TABLET | Freq: Once | ORAL | Status: DC
  Filled 2023-03-23: qty 2

## 2023-03-23 MED ORDER — POTASSIUM CHLORIDE CRYS ER 20 MEQ PO TBCR
20.0000 meq | EXTENDED_RELEASE_TABLET | Freq: Once | ORAL | Status: AC
  Administered 2023-03-23: 20 meq via ORAL
  Filled 2023-03-23: qty 1

## 2023-03-23 NOTE — Plan of Care (Signed)

## 2023-03-23 NOTE — Progress Notes (Signed)
 PROGRESS NOTE  Brian Burns  MWU:[id number] DOB: [birth year] DOA: 03/14/2023 PCP: Pcp, No  Consultants  Brief Narrative: 78 y.o. male with medical history significant of type 2 diabetes, essential hypertension BPH, vascular dementia, chronic kidney disease stage III, GERD, paroxysmal atrial fibrillation, coronary artery disease, seizure disorder, bilateral hearing loss, esophageal dysphagia among other things he is a resident of skilled nursing facility and has recently been having poor oral intake nausea and vomiting.  Admitted for failure to thrive and found to have oral phase dysphagia.  EGD showed concern for achalasia.  Modified barium swallow followed which showed concern for the same.  On 2/21 patient had EGD with Botox injections.  Also found Candida on endoscopy, being treated for the same.  Now awaiting placement back to facility.       Assessment and Plan: Oral phase dysphagia/adult failure to thrive: -S/p EGD 2/21.  Technically successful procedure with Botox injected along GE junction. -Concern for Candida based on prior biopsies.  Starting Diflucan 40 mg p.o. x 1 followed by 200 mg daily for the next 2 weeks. - Appreciate GI input.   -Can resume anticoagulation on 2/23. -Continue pured meals/dysphagia diet.  - overall improving well   Candidiasis:  -Found on esophageal biopsies -Treatment as above.    Acute kidney injury superimposed on stage 3a chronic kidney disease (HCC) - baseline SCr 1.6 -Deemed prerenal secondary to nausea vomiting and poor oral intake. -Acute kidney injury resolved.  -Checking his creatinine/GFR tomorrow.  May need to adjust Diflucan if less than 50 creatinine clearance  Hypokalemia:  - K+ 3.0 today, down from 3.4 yesterday. - Kdur ordered yesterday, but pt refused all meds.  Reordered today 40 mEq now, and 20 mEq this evening.     Dementia, vascular (HCC) -Evidently this is worsening on an outpatient basis. -Fairly nonverbal other than asking  for Pepsi today. -Agree with prior notes that PEG tube likely not beneficial for this patient.   History of anemia due to CKD -Hovering around his baseline hemoglobin of 9-10   Seizure (HCC) -History of the same.  No issues this admission. -Not on any antiseizure meds as on the hospital.  Sounds like it has been quite sometime since he had a seizure.   Coronary artery disease involving native coronary artery of native heart without angina pectoris -Stable without any issues in house. -On statin outpatient.  Not on antiplatelet presumed because he is on long-acting apixaban.  Thrombocytopenia (HCC) -Stable.  Elevated on admission.  However his baseline appears to be in the mid 100s but as low as 90s going back to 2021.   Paroxysmal atrial fibrillation (HCC) -Stable.  Apixaban being held due to procedure.   BPH (benign prostatic hyperplasia) -On finasteride outpatient. -Last PSA was normal but this was back in 2015.   Hypertension associated with diabetes (HCC) -Blood pressure stable/borderline low off of his home blood pressure medications.   Type 2 diabetes mellitus with hypercholesterolemia (HCC) -On SSI.  No need for further insulin due to poor oral intake.   DVT prophylaxis:  SCDs   Code Status:   Code Status: Limited: Do not attempt resuscitation (DNR) -DNR-LIMITED -Do Not Intubate/DNI  Level of care: Med-Surg Status is: Inpatient Dispo:  now medically stable for discharge.    Consults called: Gastroenterology  Subjective: Awake on my exam.  Denied any pain.  No complaints.  Back to asking for a Pepsi.    Objective: Vitals:   03/22/23 0448 03/22/23 1235 03/22/23 1943 03/23/23  0425  BP: 100/69 92/64 104/71 101/60  Pulse: (!) 58 76 (!) 56 (!) 51  Resp: 14 18 18    Temp: (!) 97.5 F (36.4 C) 98.1 F (36.7 C) 97.8 F (36.6 C) 97.9 F (36.6 C)  TempSrc:  Oral Oral Oral  SpO2: 97% 100% 98% 97%  Weight:      Height:        Intake/Output Summary (Last 24 hours)  at 03/23/2023 1048 Last data filed at 03/23/2023 0425 Gross per 24 hour  Intake 555 ml  Output 200 ml  Net 355 ml   Filed Weights   03/14/23 1542 03/21/23 1232  Weight: 69.2 kg 69.2 kg   Body mass index is 20.69 kg/m.  Gen: 78 y.o. male in no apparent distress.  Nontoxic.  Mostly edentulous.  Pulm: Non-labored breathing.  Clear to auscultation bilaterally.  CV: Regular rate and rhythm. No murmur, rub, or gallop. No JVD GI: Abdomen soft, non-tender, non-distended, normal bowel sounds Ext: Warm, no deformities, no pedal edema Skin: No rashes, lesions  Neuro: Alert.  No focal neurological deficits. Psych: Calm not oriented   I have personally reviewed the following labs and images: CBC: Recent Labs  Lab 03/19/23 0456 03/20/23 0442 03/22/23 0533  WBC 5.6 6.1 7.2  NEUTROABS 3.8  --   --   HGB 9.7* 9.3* 9.2*  HCT 31.8* 30.5* 29.4*  MCV 95.2 92.7 92.2  PLT 109* 95* 95*   BMP &GFR Recent Labs  Lab 03/17/23 0448 03/19/23 0456 03/20/23 0442 03/22/23 0533 03/23/23 0440  NA 141 140 139 137 137  K 4.0 3.7 3.9 3.4* 3.1*  CL 110 110 104 103 103  CO2 23 24 24 25 26   GLUCOSE 83 87 73 93 94  BUN 17 16 16 16 15   CREATININE 1.44* 1.29* 1.40* 1.17 1.09  CALCIUM 8.4* 8.5* 8.4* 8.0* 8.2*   Estimated Creatinine Clearance: 56.4 mL/min (by C-G formula based on SCr of 1.09 mg/dL). Liver & Pancreas: Recent Labs  Lab 03/17/23 0448 03/19/23 0456  AST 23 22  ALT 17 17  ALKPHOS 69 73  BILITOT 0.8 0.5  PROT 6.4* 6.5  ALBUMIN 2.3* 2.6*   No results for input(s): "LIPASE", "AMYLASE" in the last 168 hours. No results for input(s): "AMMONIA" in the last 168 hours. Diabetic: No results for input(s): "HGBA1C" in the last 72 hours. No results for input(s): "GLUCAP" in the last 168 hours. Cardiac Enzymes: No results for input(s): "CKTOTAL", "CKMB", "CKMBINDEX", "TROPONINI" in the last 168 hours. No results for input(s): "PROBNP" in the last 8760 hours. Coagulation Profile: Recent  Labs  Lab 03/17/23 1409  INR 1.3*   Thyroid Function Tests: No results for input(s): "TSH", "T4TOTAL", "FREET4", "T3FREE", "THYROIDAB" in the last 72 hours. Lipid Profile: No results for input(s): "CHOL", "HDL", "LDLCALC", "TRIG", "CHOLHDL", "LDLDIRECT" in the last 72 hours. Anemia Panel: No results for input(s): "VITAMINB12", "FOLATE", "FERRITIN", "TIBC", "IRON", "RETICCTPCT" in the last 72 hours. Urine analysis:    Component Value Date/Time   COLORURINE AMBER (A) 05/18/2020 1702   APPEARANCEUR CLOUDY (A) 05/18/2020 1702   LABSPEC 1.027 05/18/2020 1702   PHURINE 5.0 05/18/2020 1702   GLUCOSEU NEGATIVE 05/18/2020 1702   HGBUR MODERATE (A) 05/18/2020 1702   HGBUR negative 06/19/2007 1522   BILIRUBINUR NEGATIVE 05/18/2020 1702   BILIRUBINUR negative 10/29/2018 0000   BILIRUBINUR SMALL 09/20/2014 1622   KETONESUR NEGATIVE 05/18/2020 1702   PROTEINUR 30 (A) 05/18/2020 1702   UROBILINOGEN 0.2 10/29/2018 0000   UROBILINOGEN 1.0 07/14/2017  1235   NITRITE POSITIVE (A) 05/18/2020 1702   LEUKOCYTESUR LARGE (A) 05/18/2020 1702   Sepsis Labs: Invalid input(s): "PROCALCITONIN", "LACTICIDVEN"  Microbiology: No results found for this or any previous visit (from the past 240 hours).  Radiology Studies: No results found.   Scheduled Meds:  feeding supplement  237 mL Oral BID BM   fluconazole  200 mg Oral Daily   fluconazole  400 mg Oral Once   pantoprazole  40 mg Oral Daily   potassium chloride  20 mEq Oral Once   potassium chloride  40 mEq Oral Once   Continuous Infusions:  promethazine (PHENERGAN) injection (IM or IVPB)       LOS: 9 days   35 minutes with more than 50% spent in reviewing records, counseling patient/family and coordinating care.  Tobey Grim, MD [facility] [email or web address] 03/23/2023, 10:48 AM

## 2023-03-24 ENCOUNTER — Encounter (HOSPITAL_COMMUNITY): Payer: Self-pay | Admitting: Gastroenterology

## 2023-03-24 DIAGNOSIS — N1831 Chronic kidney disease, stage 3a: Secondary | ICD-10-CM | POA: Diagnosis not present

## 2023-03-24 DIAGNOSIS — N179 Acute kidney failure, unspecified: Secondary | ICD-10-CM | POA: Diagnosis not present

## 2023-03-24 LAB — BASIC METABOLIC PANEL
Anion gap: 7 (ref 5–15)
BUN: 12 mg/dL (ref 8–23)
CO2: 25 mmol/L (ref 22–32)
Calcium: 7.9 mg/dL — ABNORMAL LOW (ref 8.9–10.3)
Chloride: 101 mmol/L (ref 98–111)
Creatinine, Ser: 1.19 mg/dL (ref 0.61–1.24)
GFR, Estimated: >60 mL/min (ref >60)
Glucose, Bld: 90 mg/dL (ref 70–99)
Potassium: 3.5 mmol/L (ref 3.5–5.1)
Sodium: 133 mmol/L — ABNORMAL LOW (ref 135–145)

## 2023-03-24 NOTE — Progress Notes (Signed)
 Speech Language Pathology Treatment: Dysphagia  Patient Details Name: Brian Burns MRN: [id number] DOB: [birth year] Today's Date: 03/24/2023 Time: 1205-1220 SLP Time Calculation (min) (ACUTE ONLY): 15 min  Assessment / Plan / Recommendation Clinical Impression  Patient seen by SLP for skilled treatment focused on dysphagia goals. Patient was awake and alert, spouse and daughter both in room. Patient had just had some of his lunch meal (pureed solids) and was drinking a soda when SLP arrived. Per RN, he seems to be tolerating PO's better and although he had an incident of emesis this morning, it did not appear due to PO's. She did also state that he only has wanted to drink liquids and has consumed very little of the puree solids. Daughter feels that patient has been keeping his food down better since EGD with Botox procedure. Patient required cues to encourage him to feed self rather than waiting for daughter to do it for him. He was able to hold cup and drink via straw sips. SLP continues to suspect swallow initiation delay (confirmed by MBS). Patient did exhibit delayed belching but no overt s/s aspiration and no regurgitation/emesis. He tolerated pills whole with sips of his soda. SLP reviewed GERD/esophageal precautions and discussed how his primary swallow impairment is esophageal. SLP is recommending f/u at SNF to ensure patient is following swallow safety and GERD/esophageal swallow precautions and to ensure staff is educated of his dysphagia and precautions as well. SLP to s/o at this time.    HPI HPI: Brian Burns is a 78 y.o. male with medical history significant of type 2 diabetes, essential hypertension BPH, vascular dementia, chronic kidney disease stage III, GERD, paroxysmal atrial fibrillation, coronary artery disease, seizure disorder, bilateral hearing loss, esophageal dysphagia among other things he is a resident of skilled nursing facility and has recently been having poor oral  intake nausea and vomiting.  Patient has also exhibited some choking sensation when he ate.  He has been losing weight lately.  Chest xray was showing no acute cardiopulmonary disease. He is known to ST service with MBS completed on 05/19/2020 with concern for possible esophageal dysphagia.      SLP Plan  All goals met      Recommendations for follow up therapy are one component of a multi-disciplinary discharge planning process, led by the attending physician.  Recommendations may be updated based on patient status, additional functional criteria and insurance authorization.    Recommendations  Diet recommendations: Dysphagia 1 (puree);Thin liquid Liquids provided via: Cup;Straw Medication Administration: Other (Comment) (as tolerated (crushed or whole in puree)) Supervision: Full supervision/cueing for compensatory strategies Compensations: Minimize environmental distractions;Slow rate;Small sips/bites Postural Changes and/or Swallow Maneuvers: Seated upright 90 degrees;Upright 30-60 min after meal                  Oral care BID   Frequent or constant Supervision/Assistance Dysphagia, pharyngoesophageal phase (R13.14);Dysphagia, oral phase (R13.11)     All goals met     Angela Nevin, MA, CCC-SLP Speech Therapy

## 2023-03-24 NOTE — Plan of Care (Signed)
 Patient remains with slurred speech. Tolerating thin liquids. Refusing meals and noted with poor appetite. Nutrition supplement given and family encourage to assist with meals when visiting. Skin care rendered.  Problem: Education: Goal: Knowledge of General Education information will improve Description: Including pain rating scale, medication(s)/side effects and non-pharmacologic comfort measures Outcome: Progressing   Problem: Health Behavior/Discharge Planning: Goal: Ability to manage health-related needs will improve Outcome: Progressing   Problem: Clinical Measurements: Goal: Ability to maintain clinical measurements within normal limits will improve Outcome: Progressing Goal: Will remain free from infection Outcome: Progressing Goal: Diagnostic test results will improve Outcome: Progressing Goal: Respiratory complications will improve Outcome: Progressing Goal: Cardiovascular complication will be avoided Outcome: Progressing   Problem: Activity: Goal: Risk for activity intolerance will decrease Outcome: Progressing   Problem: Nutrition: Goal: Adequate nutrition will be maintained Outcome: Progressing   Problem: Coping: Goal: Level of anxiety will decrease Outcome: Progressing   Problem: Elimination: Goal: Will not experience complications related to bowel motility Outcome: Progressing Goal: Will not experience complications related to urinary retention Outcome: Progressing   Problem: Pain Managment: Goal: General experience of comfort will improve and/or be controlled Outcome: Progressing   Problem: Safety: Goal: Ability to remain free from injury will improve Outcome: Progressing   Problem: Skin Integrity: Goal: Risk for impaired skin integrity will decrease Outcome: Progressing

## 2023-03-24 NOTE — Progress Notes (Signed)
 PROGRESS NOTE  Brian Burns  ZOX:[id number] DOB: 18-Apr-1945 DOA: 03/14/2023 PCP: Pcp, No  Consultants  Brief Narrative: 78 y.o. male with medical history significant of type 2 diabetes, essential hypertension BPH, vascular dementia, chronic kidney disease stage III, GERD, paroxysmal atrial fibrillation, coronary artery disease, seizure disorder, bilateral hearing loss, esophageal dysphagia among other things he is a resident of skilled nursing facility and has recently been having poor oral intake nausea and vomiting.  Admitted for failure to thrive and found to have oral phase dysphagia.  EGD showed concern for achalasia.  Modified barium swallow followed which showed concern for the same.  On 2/21 patient had EGD with Botox injections.  Also found Candida on endoscopy, being treated for the same.  Now awaiting placement back to facility.       Assessment and Plan: Oral phase dysphagia/adult failure to thrive: -S/p EGD 2/21.  Technically successful procedure with Botox injected along GE junction. -Concern for Candida based on prior biopsies.  Starting Diflucan 40 mg p.o. x 1 followed by 200 mg daily for the next 2 weeks. - Appreciate GI input.   -Can resume anticoagulation on 2/23. -Continue pured meals/dysphagia diet.  - overall improving well   Candidiasis:  -Found on esophageal biopsies -Treatment as above.    Acute kidney injury superimposed on stage 3a chronic kidney disease (HCC) - baseline SCr 1.6 -Deemed prerenal secondary to nausea vomiting and poor oral intake. -Acute kidney injury resolved.  -Checking his creatinine/GFR tomorrow.  May need to adjust Diflucan if less than 50 creatinine clearance  Hypokalemia:  - K+ 3.0 today, down from 3.4 yesterday. - Kdur ordered yesterday, but pt refused all meds.  Reordered today 40 mEq now, and 20 mEq this evening.     Dementia, vascular (HCC) -Evidently this is worsening on an outpatient basis. -Fairly nonverbal other than asking  for Pepsi today. -Agree with prior notes that PEG tube likely not beneficial for this patient.   History of anemia due to CKD -Hovering around his baseline hemoglobin of 9-10   Seizure (HCC) -History of the same.  No issues this admission. -Not on any antiseizure meds as on the hospital.  Sounds like it has been quite sometime since he had a seizure.   Coronary artery disease involving native coronary artery of native heart without angina pectoris -Stable without any issues in house. -On statin outpatient.  Not on antiplatelet presumed because he is on long-acting apixaban.  Thrombocytopenia (HCC) -Stable.  Elevated on admission.  However his baseline appears to be in the mid 100s but as low as 90s going back to 2021.   Paroxysmal atrial fibrillation (HCC) -Stable.  Apixaban being held due to procedure.   BPH (benign prostatic hyperplasia) -On finasteride outpatient. -Last PSA was normal but this was back in 2015.   Hypertension associated with diabetes (HCC) -Blood pressure stable/borderline low off of his home blood pressure medications.   Type 2 diabetes mellitus with hypercholesterolemia (HCC) -On SSI.  No need for further insulin due to poor oral intake.   DVT prophylaxis:  SCDs   Code Status:   Code Status: Limited: Do not attempt resuscitation (DNR) -DNR-LIMITED -Do Not Intubate/DNI  Level of care: Med-Surg Status is: Inpatient Dispo:  now medically stable for discharge.    Consults called: Gastroenterology  Subjective: Awake and my exam.  Denied any pain.  No complaints.  Reported being thirsty but otherwise no concerns.  Objective: Vitals:   03/22/23 1235 03/22/23 1943 03/23/23 0425 03/23/23 1439  BP: 92/64 104/71 101/60 102/67  Pulse: 76 (!) 56 (!) 51 (!) 55  Resp: 18 18  16   Temp: 98.1 F (36.7 C) 97.8 F (36.6 C) 97.9 F (36.6 C) (!) 97.5 F (36.4 C)  TempSrc: Oral Oral Oral Oral  SpO2: 100% 98% 97% 96%  Weight:      Height:         Intake/Output Summary (Last 24 hours) at 03/24/2023 1648 Last data filed at 03/23/2023 1824 Gross per 24 hour  Intake --  Output 300 ml  Net -300 ml   Filed Weights   03/14/23 1542 03/21/23 1232  Weight: 69.2 kg 69.2 kg   Body mass index is 20.69 kg/m.  Gen: 78 y.o. male in no apparent distress.  Nontoxic.  Mostly edentulous.  Pulm: Non-labored breathing.  Clear to auscultation bilaterally.  CV: Regular rate and rhythm. No murmur, rub, or gallop. No JVD GI: Abdomen soft, non-tender, non-distended, normal bowel sounds Ext: Warm, no deformities, no pedal edema Skin: No rashes, lesions  Neuro: Alert.  No focal neurological deficits. Psych: Calm not oriented   I have personally reviewed the following labs and images: CBC: Recent Labs  Lab 03/19/23 0456 03/20/23 0442 03/22/23 0533  WBC 5.6 6.1 7.2  NEUTROABS 3.8  --   --   HGB 9.7* 9.3* 9.2*  HCT 31.8* 30.5* 29.4*  MCV 95.2 92.7 92.2  PLT 109* 95* 95*   BMP &GFR Recent Labs  Lab 03/19/23 0456 03/20/23 0442 03/22/23 0533 03/23/23 0440 03/24/23 0421  NA 140 139 137 137 133*  K 3.7 3.9 3.4* 3.1* 3.5  CL 110 104 103 103 101  CO2 24 24 25 26 25   GLUCOSE 87 73 93 94 90  BUN 16 16 16 15 12   CREATININE 1.29* 1.40* 1.17 1.09 1.19  CALCIUM 8.5* 8.4* 8.0* 8.2* 7.9*   Estimated Creatinine Clearance: 51.7 mL/min (by C-G formula based on SCr of 1.19 mg/dL). Liver & Pancreas: Recent Labs  Lab 03/19/23 0456  AST 22  ALT 17  ALKPHOS 73  BILITOT 0.5  PROT 6.5  ALBUMIN 2.6*   No results for input(s): "LIPASE", "AMYLASE" in the last 168 hours. No results for input(s): "AMMONIA" in the last 168 hours. Diabetic: No results for input(s): "HGBA1C" in the last 72 hours. No results for input(s): "GLUCAP" in the last 168 hours. Cardiac Enzymes: No results for input(s): "CKTOTAL", "CKMB", "CKMBINDEX", "TROPONINI" in the last 168 hours. No results for input(s): "PROBNP" in the last 8760 hours. Coagulation Profile: No  results for input(s): "INR", "PROTIME" in the last 168 hours.  Thyroid Function Tests: No results for input(s): "TSH", "T4TOTAL", "FREET4", "T3FREE", "THYROIDAB" in the last 72 hours. Lipid Profile: No results for input(s): "CHOL", "HDL", "LDLCALC", "TRIG", "CHOLHDL", "LDLDIRECT" in the last 72 hours. Anemia Panel: No results for input(s): "VITAMINB12", "FOLATE", "FERRITIN", "TIBC", "IRON", "RETICCTPCT" in the last 72 hours. Urine analysis:    Component Value Date/Time   COLORURINE AMBER (A) 05/18/2020 1702   APPEARANCEUR CLOUDY (A) 05/18/2020 1702   LABSPEC 1.027 05/18/2020 1702   PHURINE 5.0 05/18/2020 1702   GLUCOSEU NEGATIVE 05/18/2020 1702   HGBUR MODERATE (A) 05/18/2020 1702   HGBUR negative 06/19/2007 1522   BILIRUBINUR NEGATIVE 05/18/2020 1702   BILIRUBINUR negative 10/29/2018 0000   BILIRUBINUR SMALL 09/20/2014 1622   KETONESUR NEGATIVE 05/18/2020 1702   PROTEINUR 30 (A) 05/18/2020 1702   UROBILINOGEN 0.2 10/29/2018 0000   UROBILINOGEN 1.0 07/14/2017 1235   NITRITE POSITIVE (A) 05/18/2020 1702  LEUKOCYTESUR LARGE (A) 05/18/2020 1702   Sepsis Labs: Invalid input(s): "PROCALCITONIN", "LACTICIDVEN"  Microbiology: No results found for this or any previous visit (from the past 240 hours).  Radiology Studies: No results found.   Scheduled Meds:  Chlorhexidine Gluconate Cloth  6 each Topical Daily   feeding supplement  237 mL Oral BID BM   fluconazole  200 mg Oral Daily   fluconazole  400 mg Oral Once   pantoprazole  40 mg Oral Daily   potassium chloride  40 mEq Oral Once   Continuous Infusions:  promethazine (PHENERGAN) injection (IM or IVPB)       LOS: 10 days   25 minutes with more than 50% spent in reviewing records, counseling patient/family and coordinating care.  Tobey Grim, MD [facility] [email or web address] 03/24/2023, 4:48 PM

## 2023-03-25 ENCOUNTER — Ambulatory Visit: Payer: 59 | Admitting: Gastroenterology

## 2023-03-25 DIAGNOSIS — N1831 Chronic kidney disease, stage 3a: Secondary | ICD-10-CM | POA: Diagnosis not present

## 2023-03-25 DIAGNOSIS — N179 Acute kidney failure, unspecified: Secondary | ICD-10-CM | POA: Diagnosis not present

## 2023-03-25 MED ORDER — FAMOTIDINE 20 MG PO TABS: 20.0000 mg | ORAL_TABLET | Freq: Every day | ORAL | 0 refills | Status: AC

## 2023-03-25 MED ORDER — APIXABAN 5 MG PO TABS
5.0000 mg | ORAL_TABLET | Freq: Two times a day (BID) | ORAL | Status: DC
  Administered 2023-03-25: 5 mg via ORAL
  Filled 2023-03-25: qty 1

## 2023-03-25 MED ORDER — FLUCONAZOLE 200 MG PO TABS: 200.0000 mg | ORAL_TABLET | Freq: Every day | ORAL | 0 refills | Status: DC

## 2023-03-25 MED ORDER — FLUCONAZOLE 200 MG PO TABS: 200.0000 mg | ORAL_TABLET | Freq: Every day | ORAL | 0 refills | Status: AC

## 2023-03-25 NOTE — Progress Notes (Signed)
 Report called to nurse at [facility]

## 2023-03-25 NOTE — Discharge Summary (Signed)
 Physician Discharge Summary   Patient: Brian Burns MRN: [id number] DOB: March 12, 1945  Admit date:     03/14/2023  Discharge date: 03/25/23  Discharge Physician: Tobey Grim   PCP: Pcp, No   Recommendations at discharge:    Pt admitted for failure to thrive.  Throughout hospital workup found to eventually have achalasia and candidal esophagitis. Ensure the patient takes 11 more days of Diflucan 200 mg p.o. daily. Patient had Foley placed by urology in house for urinary retention.  Patient should follow-up with urology 1 to 2 weeks after discharge from hospital. Patient should follow-up with GI 2-3 weeks after discharge to follow-up for candidal esophagitis and Botox injections. Patient continues to have occasional episodes of coughing after meals.  He should continue to follow-up with speech language pathology outside the hospital.  Discharge Diagnoses: Principal Problem:   Acute kidney injury superimposed on stage 3a chronic kidney disease (HCC) - baseline SCr 1.6 Active Problems:   Dementia, vascular (HCC)   Oral phase dysphagia   Nausea   Type 2 diabetes mellitus with hypercholesterolemia (HCC)   Hypertension associated with diabetes (HCC)   BPH (benign prostatic hyperplasia)   GERD (gastroesophageal reflux disease)   CKD stage 3a, GFR 45-59 ml/min (HCC) - baseline scr 1.6   Paroxysmal atrial fibrillation (HCC)   Thrombocytopenia (HCC)   Coronary artery disease involving native coronary artery of native heart without angina pectoris   Seizure (HCC)   DNR (do not resuscitate)/DNI(Do Not intubate)   History of anemia due to CKD  Resolved Problems:   * No resolved hospital problems. *  Hospital Course: 78 y.o. male with medical history significant of type 2 diabetes, essential hypertension BPH, vascular dementia, chronic kidney disease stage III, GERD, paroxysmal atrial fibrillation, coronary artery disease, seizure disorder, bilateral hearing loss, esophageal dysphagia  among other things he is a resident of skilled nursing facility and has recently been having poor oral intake nausea and vomiting.  Admitted for failure to thrive and found to have oral phase dysphagia.  EGD showed concern for achalasia.  Modified barium swallow followed which showed concern for the same.  On 2/21 patient had EGD with Botox injections as treatment for achalasia.  Also found Candida on endoscopy, being treated for the same.       Assessment and Plan: Oral phase dysphagia/adult failure to thrive: -S/p EGD 2/21.  Technically successful procedure with Botox injected along GE junction. -Concern for Candida based on prior biopsies.  Starting Diflucan 40 mg p.o. x 1 followed by 200 mg daily for the next 2 weeks. - Appreciate GI input.   -Can resume anticoagulation on 2/23. -Continue pured meals/dysphagia diet.  - overall improving well --you continue to have some episodes of coughing after most meals.  Recommend continued speech-language pathology outpatient.   Candidiasis:  -Found on esophageal biopsies -400 mg Diflucan given x 1 followed by 14 days of 200 mg Diflucan daily.  Patient will need 11 more days after discharge from hospital.    Acute kidney injury superimposed on stage 3a chronic kidney disease (HCC) - baseline SCr 1.6 -Deemed prerenal secondary to nausea vomiting and poor oral intake. -Acute kidney injury resolved.    Hypokalemia:  - K+ 3.0 today, down from 3.4 yesterday. - Kdur ordered yesterday, but pt refused all meds.  Reordered today 40 mEq now, and 20 mEq this evening.     Dementia, vascular (HCC) -Evidently this is worsening on an outpatient basis. -Fairly nonverbal other than asking for Pepsi  today. -Agree with prior notes that PEG tube likely not beneficial for this patient. -Overall patient seems to have fairly advanced dementia.  Able to only answer questions with yes or no or little confused with answers. -He is back to his baseline mental status at  discharge.   History of anemia due to CKD -Hovering around his baseline hemoglobin of 9-10   Seizure (HCC) -History of the same.  No issues this admission. -Not on any antiseizure meds as on the hospital.  Sounds like it has been quite sometime since he had a seizure.   Coronary artery disease involving native coronary artery of native heart without angina pectoris -Stable without any issues in house. -On statin outpatient.  Not on antiplatelet presumed because he is on long-acting apixaban.   Thrombocytopenia (HCC) -Stable.  Elevated on admission.  However his baseline appears to be in the mid 100s but as low as 90s going back to 2021.   Paroxysmal atrial fibrillation (HCC) -Stable.  Apixaban being held due to procedure.   BPH (benign prostatic hyperplasia) -On finasteride outpatient. -Last PSA was normal but this was back in 2015. -Acute retention in house and urology placed Foley catheter.  Should follow-up with urology for further evaluation after discharge.   Hypertension associated with diabetes (HCC) -Blood pressure stable/borderline low off of his home blood pressure medications.   Type 2 diabetes mellitus with hypercholesterolemia (HCC) -On SSI.  No need for further insulin due to poor oral intake.      Consultants: Gastroenterology Procedures performed: EGD, modified barium swallow, EGD with Botox injection Disposition: Nursing home Diet recommendation:  Discharge Diet Orders (From admission, onward)     Start     Ordered   03/25/23 0000  Diet - low sodium heart healthy        03/25/23 1255           Dysphagia type 3 thin Liquid DISCHARGE MEDICATION: Allergies as of 03/25/2023       Reactions   Gabapentin Other (See Comments)   Sedation at 300 mg TID, but listed as "allergic," on the [facility]        Medication List     STOP taking these medications    traMADol 50 MG tablet Commonly known as: Ultram       TAKE these medications     acetaminophen 325 MG tablet Commonly known as: TYLENOL Take 650 mg by mouth every 4 (four) hours as needed for mild pain (pain score 1-3) (CANNOT EXCEED A SUM TOTAL OF 3,000 MG FROM ALL COMBINED SOURCES/24 HOURS).   apixaban 5 MG Tabs tablet Commonly known as: ELIQUIS Take 5 mg by mouth 2 (two) times daily.   atorvastatin 80 MG tablet Commonly known as: LIPITOR Take 80 mg by mouth at bedtime.   bisacodyl 10 MG suppository Commonly known as: DULCOLAX Place 10 mg rectally daily as needed (for constipation).   buPROPion 75 MG tablet Commonly known as: WELLBUTRIN Take 75 mg by mouth every morning.   cetirizine 10 MG tablet Commonly known as: ZYRTEC Take 10 mg by mouth daily.   cholecalciferol 25 MCG (1000 UNIT) tablet Commonly known as: VITAMIN D3 Take 2,000 Units by mouth daily.   docusate sodium 100 MG capsule Commonly known as: COLACE Take 100 mg by mouth 2 (two) times daily.   donepezil 5 MG tablet Commonly known as: ARICEPT Take 5 mg by mouth in the morning.   famotidine 20 MG tablet Commonly known as: PEPCID Take 1 tablet (20  mg total) by mouth daily. What changed: when to take this   feeding supplement (GLUCERNA SHAKE) Liqd Take 237 mLs by mouth 4 (four) times daily.   Ferrous Fumarate 324 MG Tabs Take 324 mg by mouth daily with breakfast.   finasteride 5 MG tablet Commonly known as: PROSCAR Take 5 mg by mouth at bedtime.   fluconazole 200 MG tablet Commonly known as: DIFLUCAN Take 1 tablet (200 mg total) by mouth daily for 11 days. Start taking on: March 26, 2023   lipase/protease/amylase 12000-38000 units Cpep capsule Commonly known as: CREON Take 12,000 Units by mouth 3 (three) times daily before meals.   melatonin 3 MG Tabs tablet Take 3 mg by mouth at bedtime.   multivitamin tablet Take 1 tablet by mouth daily with breakfast.   pantoprazole 40 MG tablet Commonly known as: PROTONIX Take 40 mg by mouth 2 (two) times daily.    polyethylene glycol 17 g packet Commonly known as: MIRALAX / GLYCOLAX Take 17 g by mouth daily as needed. What changed: reasons to take this   potassium chloride 10 MEQ tablet Commonly known as: KLOR-CON Take 1 tablet (10 mEq total) by mouth daily.   pregabalin 75 MG capsule Commonly known as: LYRICA Take 75 mg by mouth in the morning and at bedtime.   senna 8.6 MG tablet Commonly known as: SENOKOT Take 2 tablets by mouth See admin instructions. Take 2 tablets by mouth twice a day and hold for constipation   sodium phosphate Enem Place 1 enema rectally daily as needed (for constipation not relieved by Dulcolax).        Contact information for after-discharge care     Destination     [facility] .   Service: Skilled Nursing Contact information: [address] [place] [address] [phone]                    Discharge Exam: Ceasar Mons Weights   03/14/23 1542 03/21/23 1232  Weight: 69.2 kg 69.2 kg   Gen: 78 y.o. male in no apparent distress.  Nontoxic.  Mostly edentulous.  Pulm: Non-labored breathing.  Clear to auscultation bilaterally.  CV: Regular rate and rhythm. No murmur, rub, or gallop. No JVD GI: Abdomen soft, non-tender, non-distended, normal bowel sounds Ext: Warm, no deformities, no pedal edema Skin: No rashes, lesions  Neuro: Alert.  No focal neurological deficits. Psych: Calm not oriented  Condition at discharge: fair  The results of significant diagnostics from this hospitalization (including imaging, microbiology, ancillary and laboratory) are listed below for reference.   Imaging Studies: DG ESOPHAGUS W SINGLE CM (SOL OR THIN BA) Result Date: 03/20/2023 CLINICAL DATA:  Patient with history of dementia, CVA, GERD who was brought to the ED by family on 03/14/23 due to concerns for weight loss, poor oral intake, nausea and vomiting. EGD 03/19/23 showed severely dilated esophagus with fluid and food and severe stenosis  at the GE junction s/p dilation to 15 mm. Request for timed esophagram due to concern for achalasia. EXAM: ESOPHAGUS/BARIUM SWALLOW/TABLET STUDY TECHNIQUE: Single contrast examination was performed using thin liquid barium with images taken 1 minute, 2 minutes and 5 minutes after ingestion. This exam was performed by Lynnette Caffey, PA-C, and was supervised and interpreted by Jeronimo Greaves, MD. FLUOROSCOPY: Radiation Exposure Index (as provided by the fluoroscopic device): 12.90 mGy Kerma COMPARISON:  MBS 03/17/23, esophagram and MBS 05/19/20, esophagram 02/01/15. FINDINGS: Esophagus: Severely dilated and somewhat torturous esophagus. Images taken at 1 minute, 2  minutes and 5 minutes after ingestion of barium show no progression of barium from the lower esophagus into the stomach. Stasis of contrast noted in the mid esophagus with intermittent progression into the lower esophagus during the exam. Other: Patient unable to stand due to weakness or lay on his side due to pain, exam performed in AP and LPO with table tilted to 75 degrees. Difficulty drinking contrast due to nausea/gagging - ingested approximately 100 mL of barium over the course of 1 minute. IMPRESSION: Single contrast timed esophagram significant for severely dilated esophagus with no progression of barium into the stomach seen after 5 minutes. Findings highly suspicious for achalasia, given clinical history. The patient was instructed to remain upright in bed for at least 30 minutes. Electronically Signed   By: Jeronimo Greaves M.D.   On: 03/20/2023 15:00   DG Swallowing Func-Speech Pathology Result Date: 03/17/2023 Table formatting from the original result was not included. Modified Barium Swallow Study Patient Details Name: Brian Burns MRN: [id number] Date of Birth: 08/08/1945 Today's Date: 03/17/2023 HPI/PMH: HPI: Brian Burns is a 78 y.o. male with medical history significant of type 2 diabetes, essential hypertension BPH, vascular dementia,  chronic kidney disease stage III, GERD, paroxysmal atrial fibrillation, coronary artery disease, seizure disorder, bilateral hearing loss, esophageal dysphagia among other things he is a resident of skilled nursing facility and has recently been having poor oral intake nausea and vomiting.  Patient has also exhibited some choking sensation when he ate.  He has been losing weight lately.  Chest xray was showing no acute cardiopulmonary disease. He is known to ST service with MBS completed on 05/19/2020 with concern for possible esophageal dysphagia. Clinical Impression: Clinical Impression: Patient is presenting with a mild-moderate oral dysphagia and a mod-severe pharyngoesophageal dysphagia as per this MBS. Patient exhibited delays to pyrifom sinus with all tested barium consistencies (thin, nectar thick and honey thick liquids, puree solids). There were several instances of boulses starting to pass through PES before swallow was initiated. SLP had to cue patient to swallow frequently during this study. He exhibited one instance of sensed aspiration of thin liquids just below vocal cords. After swallows were finally initiated, he did exhibit clearance of majority of boluses with only trace to mild diffuse pharyngeal residuals s/p initial swallow. Towards the end of the study, retrograde movement of large amount of barium from esophagus and up through PES into pharynx (reaching as high as vallecular sinus) was observed. Deep penetration of this refluxed barium was observed. Esophageal sweep images were not clear but there did appear to be stasis of barium in esophagus. SLP is recommending to continue with current PO diet of Dys 1 (puree) solids and thin liquids  and to follow strict aspiration precautions. GI is planning for EGD with possible dilation during this admission. Factors that may increase risk of adverse event in presence of aspiration [name]: Factors that may increase risk of adverse  event in presence of aspiration [name]: Frail or deconditioned; Respiratory or GI disease Recommendations/Plan: Swallowing Evaluation Recommendations Swallowing Evaluation Recommendations Recommendations: PO diet PO Diet Recommendation: Dysphagia 1 (Pureed); Thin liquids (Level 0) Liquid Administration via: Cup Medication Administration: Crushed with puree Supervision: Full assist for feeding; Full supervision/cueing for swallowing strategies Swallowing strategies  : Slow rate; Small bites/sips Postural changes: Stay upright 30-60 min after meals; Position pt fully upright for meals Oral care recommendations: Oral care BID (2x/day) Treatment Plan Treatment Plan Treatment recommendations: Therapy as outlined  in treatment plan below Follow-up recommendations: Other (comment) (TBD pending POC recommendations) Functional status assessment: Patient has had a recent decline in their functional status and demonstrates the ability to make significant improvements in function in a reasonable and predictable amount of time. Treatment frequency: Min 2x/week Treatment duration: 1 week Interventions: Aspiration precaution training; Diet toleration management by SLP; Patient/family education Recommendations Recommendations for follow up therapy are one component of a multi-disciplinary discharge planning process, led by the attending physician.  Recommendations may be updated based on patient status, additional functional criteria and insurance authorization. Assessment: Orofacial Exam: Orofacial Exam Oral Cavity: Oral Hygiene: WFL Oral Cavity - Dentition: Edentulous Orofacial Anatomy: WFL Oral Motor/Sensory Function: WFL Anatomy: Anatomy: Other (Comment) (difficult to visualize, suspect prominent cricopharyngeal bar) Boluses Administered: Boluses Administered Boluses Administered: Thin liquids (Level 0); Mildly thick liquids (Level 2, nectar thick); Moderately thick liquids (Level 3, honey thick); Puree  Oral  Impairment Domain: Oral Impairment Domain Lip Closure: No labial escape Tongue control during bolus hold: Not tested Bolus preparation/mastication: Slow prolonged chewing/mashing with complete recollection Bolus transport/lingual motion: Slow tongue motion Oral residue: Residue collection on oral structures Location of oral residue : Tongue; Lateral sulci Initiation of pharyngeal swallow : Pyriform sinuses  Pharyngeal Impairment Domain: Pharyngeal Impairment Domain Soft palate elevation: Trace column of contrast or air between SP and PW Laryngeal elevation: Partial superior movement of thyroid cartilage/partial approximation of arytenoids to epiglottic petiole Anterior hyoid excursion: Partial anterior movement Epiglottic movement: Partial inversion Laryngeal vestibule closure: Incomplete, narrow column air/contrast in laryngeal vestibule Pharyngeal stripping wave : Present - complete Pharyngeal contraction (A/P view only): N/A Pharyngoesophageal segment opening: Complete distension and complete duration, no obstruction of flow Tongue base retraction: Trace column of contrast or air between tongue base and PPW Pharyngeal residue: Collection of residue within or on pharyngeal structures Location of pharyngeal residue: Tongue base; Valleculae; Pyriform sinuses; Aryepiglottic folds; Diffuse (>3 areas)  Esophageal Impairment Domain: Esophageal Impairment Domain Esophageal clearance upright position: Esophageal retention with retrograde flow through the PES Pill: No data recorded Penetration/Aspiration Scale Score: Penetration/Aspiration Scale Score 1.  Material does not enter airway: Moderately thick liquids (Level 3, honey thick); Puree 2.  Material enters airway, remains ABOVE vocal cords then ejected out: Mildly thick liquids (Level 2, nectar thick) 7.  Material enters airway, passes BELOW cords and not ejected out despite cough attempt by patient: Thin liquids (Level 0) Compensatory Strategies: Compensatory  Strategies Compensatory strategies: No   General Information: No data recorded Diet Prior to this Study: Dysphagia 1 (pureed); Thin liquids (Level 0)   Temperature : Normal   Respiratory Status: WFL   Supplemental O2: None (Room air)   History of Recent Intubation: No  Behavior/Cognition: Alert; Cooperative Self-Feeding Abilities: Able to self-feed Baseline vocal quality/speech: Normal Volitional Cough: Unable to elicit Volitional Swallow: Unable to elicit Exam Limitations: No limitations Goal Planning: Prognosis for improved oropharyngeal function: Fair Barriers to Reach Goals: Severity of deficits No data recorded Patient/Family Stated Goal: None stated Consulted and agree with results and recommendations: Pt unable/family or caregiver not available Pain: Pain Assessment Pain Assessment: No/denies pain End of Session: Start Time:SLP Start Time (ACUTE ONLY): 1200 Stop Time: SLP Stop Time (ACUTE ONLY): 1225 Time Calculation:SLP Time Calculation (min) (ACUTE ONLY): 25 min Charges: SLP Evaluations $ SLP Speech Visit: 1 Visit SLP Evaluations $BSS Swallow: 1 Procedure $MBS Swallow: 1 Procedure SLP visit diagnosis: SLP Visit Diagnosis: Dysphagia, pharyngoesophageal phase (R13.14); Dysphagia, oropharyngeal phase (R13.12) Past Medical History: Past Medical History: Diagnosis  Date  AAA (abdominal aortic aneurysm) without rupture (HCC) 06/2014  no change in last exam from 2015  Achalasia   Acute right hemiparesis (HCC) 05/18/2020  Anxiety   Arthritis   knees,lower back  Atrial fibrillation (HCC)   Atrial flutter with rapid ventricular response (HCC) 01/16/2015  Bilateral wrist pain 09/09/2014  Bladder outlet obstruction 01/20/2015  Bradycardia 08/27/2016  CAD (coronary artery disease)   pt denies  Carotid artery disease (HCC)   Chronic back pain   Chronic kidney disease (CKD), stage III (moderate) (HCC)   Constipation   Decreased visual acuity of left eye  12/02/2018  Depression   Dysphagia, oropharyngeal phase   Dyspnea    reports today no pregoression  Erectile dysfunction   GERD (gastroesophageal reflux disease)   Headache   hx of  History of CVA (cerebrovascular accident)   2009  &  2013  History of Multiple lacunar infarcts (HCC) 01/31/2015  MRI Brain 01/31/2015: Widespread old lacunar infarcts, particularly right Basal ganglia, pons, and right cerebellum  HLD (hyperlipidemia)   Hypertension   Idiopathic pancreatitis 12/2014  acute  Knee pain, bilateral 04/09/2011  Malnutrition of moderate degree 01/25/2015  Memory impairment   mild dementia per PCP progress note   Morbid obesity (HCC) 12/03/2018  Other abnormalities of gait and mobility 12/03/2018  PVD (peripheral vascular disease) with claudication (HCC)   Type 2 diabetes mellitus (HCC)   Urethral stricture 02/16/2013  Urge incontinence 12/08/2018  Vascular dementia [facility])  Past Surgical History: Past Surgical History: Procedure Laterality Date  back injections     Orthopedics  BALLOON DILATION N/A 12/16/2018  Procedure: URETHRAL BALLOON DILATION;  Surgeon: Crist Fat, MD;  Location: [facility];  Service: Urology;  Laterality: N/A;  BIOPSY  05/22/2020  Procedure: BIOPSY;  Surgeon: [name], MD;  Location: [facility];  Service: Gastroenterology;;  CARDIOVASCULAR STRESS TEST  08-09-2009  mild global hypokinesis,  no ischemia or scar/  ef 41%  CATARACT EXTRACTION W/ INTRAOCULAR LENS  IMPLANT, BILATERAL  april and may 2014  CYSTOSCOPY N/A 01/17/2017  Procedure: CYSTOSCOPY FLEXIBLE;  Surgeon: Crist Fat, MD;  Location: [facility];  Service: Urology;  Laterality: N/A;  CYSTOSCOPY WITH RETROGRADE URETHROGRAM N/A 11/24/2014  Procedure: CYSTOSCOPY WITH RETROGRADE URETHROGRAM;  Surgeon: Crist Fat, MD;  [facility];  Service: Urology;  Laterality: N/A;  CYSTOSCOPY WITH RETROGRADE URETHROGRAM N/A 12/16/2018  Procedure: CYSTOSCOPY WITH RETROGRADE URETHROGRAM;  Surgeon: Crist Fat, MD;  Location: [facility];  Service: Urology;   Laterality: N/A;  CYSTOSCOPY WITH RETROGRADE URETHROGRAM N/A 12/16/2019  Procedure: CYSTOSCOPY WITH RETROGRADE, Direct Vision Internal Urethrotomy;  Surgeon: Crist Fat, MD;  Location: [facility];  Service: Urology;  Laterality: N/A;  CYSTOSCOPY WITH URETHRAL DILATATION N/A 11/24/2014  Procedure: CYSTOSCOPY WITH URETHRAL DILATATION;  Surgeon: Crist Fat, MD;  Location: [facility];  Service: Urology;  Laterality: N/A;  BALLOON DILATION   ESOPHAGEAL DILATION  05/22/2020  Procedure: ESOPHAGEAL DILATION;  Surgeon: [name], MD;  Location: [facility];  Service: Gastroenterology;;  ESOPHAGOGASTRODUODENOSCOPY N/A 05/15/2014  Procedure: ESOPHAGOGASTRODUODENOSCOPY (EGD);  Surgeon: Carman Ching, MD;  Location[facility];  Service: Endoscopy;  Laterality: N/A;  ESOPHAGOGASTRODUODENOSCOPY Left 02/02/2015  Procedure: ESOPHAGOGASTRODUODENOSCOPY (EGD);  Surgeon: Dorena Cookey, MD;  Location[facility]COPY;  Service: Endoscopy;  Laterality: Left;  ESOPHAGOGASTRODUODENOSCOPY Left 03/16/2015  Procedure: ESOPHAGOGASTRODUODENOSCOPY (EGD);  Surgeon: Dorena Cookey, MD;  [facility];  Service: Endoscopy;  Laterality: Left;  ESOPHAGOGASTRODUODENOSCOPY (EGD) WITH PROPOFOL N/A 05/22/2020  Procedure: ESOPHAGOGASTRODUODENOSCOPY (EGD)  WITH PROPOFOL;  Surgeon: [name], MD;  Location: [facility];  Service: Gastroenterology;  Laterality: N/A;  FOREIGN BODY REMOVAL  05/22/2020  Procedure: FOREIGN BODY REMOVAL;  Surgeon: [name], MD;  Location: [facility];  Service: Gastroenterology;;  TRANSTHORACIC ECHOCARDIOGRAM  01-07-2012  mild to moderate dilated LV,  ef 45-50%,  mild basal hypokinesis,  grade I diastolic  dysfunction/  trivial AR/  mild MR  URETHROPLASTY N/A 01/17/2017  Procedure: URETHEROPLASTY BUCCAL GRAFT AND BUCCAL MUCOSA GRAFT HARVEST;  Surgeon: Crist Fat, MD;  Location: [facility];  Service: Urology;  Laterality: N/A; Angela Nevin, MA, CCC-SLP Speech Therapy   DG  Abdomen Acute W/Chest Result Date: 03/14/2023 CLINICAL DATA:  choking, vomiting EXAM: DG ABDOMEN ACUTE WITH 1 VIEW CHEST COMPARISON:  Chest x-ray 06/26/2021 FINDINGS: Similar-appearing widened mediastinum consistent with known underlying esophageal dilatation in tortuosity. Other the heart and mediastinal contours are unchanged. No focal consolidation. No pulmonary edema. No pleural effusion. No pneumothorax. Stool throughout the colon. There is no evidence of dilated bowel loops or free intraperitoneal air. No radiopaque calculi or other significant radiographic abnormality is seen. Atherosclerotic plaque. No acute osseous abnormality. IMPRESSION: 1. No acute cardiopulmonary disease. 2. Nonobstructive bowel gas pattern. 3. Stool throughout the colon-correlate for constipation. 4. Similar-appearing widened mediastinum consistent with known underlying esophageal dilatation in tortuosity. Electronically Signed   By: Tish Frederickson M.D.   On: 03/14/2023 18:13    Microbiology: Results for orders placed or performed during the hospital encounter of 05/18/20  Resp Panel by RT-PCR (Flu A&B, Covid) Nasopharyngeal Swab     Status: None   Collection Time: 05/18/20  4:36 PM   Specimen: Nasopharyngeal Swab; Nasopharyngeal(NP) swabs in vial transport medium  Result Value Ref Range Status   SARS Coronavirus 2 by RT PCR NEGATIVE NEGATIVE Final    Comment: (NOTE) SARS-CoV-2 target nucleic acids are NOT DETECTED.  The SARS-CoV-2 RNA is generally detectable in upper respiratory specimens during the acute phase of infection. The lowest concentration of SARS-CoV-2 viral copies this assay can detect is 138 copies/mL. A negative result does not preclude SARS-Cov-2 infection and should not be used as the sole basis for treatment or other patient management decisions. A negative result may occur with  improper specimen collection/handling, submission of specimen other than nasopharyngeal swab, presence of viral  mutation(s) within the areas targeted by this assay, and inadequate number of viral copies(<138 copies/mL). A negative result must be combined with clinical observations, patient history, and epidemiological information. The expected result is Negative.  Fact Sheet for Patients:  BloggerCourse.com  Fact Sheet for Healthcare Providers:  SeriousBroker.it  This test is no t yet approved or cleared by the [place] FDA and  has been authorized for detection and/or diagnosis of SARS-CoV-2 by FDA under an Emergency Use Authorization (EUA). This EUA will remain  in effect (meaning this test can be used) for the duration of the COVID-19 declaration under Section 564(b)(1) of the Act, 21 U.S.C.section 360bbb-3(b)(1), unless the authorization is terminated  or revoked sooner.       Influenza A by PCR NEGATIVE NEGATIVE Final   Influenza B by PCR NEGATIVE NEGATIVE Final    Comment: (NOTE) The Xpert Xpress SARS-CoV-2/FLU/RSV plus assay is intended as an aid in the diagnosis of influenza from Nasopharyngeal swab specimens and should not be used as a sole basis for treatment. Nasal washings and aspirates are unacceptable for Xpert Xpress SARS-CoV-2/FLU/RSV testing.  Fact Sheet for Patients: BloggerCourse.com  Fact Sheet for Healthcare Providers:  SeriousBroker.it  This test is not yet approved or cleared by the [place] and has been authorized for detection and/or diagnosis of SARS-CoV-2 by FDA under an Emergency Use Authorization (EUA). This EUA will remain in effect (meaning this test can be used) for the duration of the COVID-19 declaration under Section 564(b)(1) of the Act, 21 U.S.C. section 360bbb-3(b)(1), unless the authorization is terminated or revoked.  Performed at [facility], [address], [place], [place] [address]   Culture, blood (routine x 2)      Status: None   Collection Time: 05/18/20  5:07 PM   Specimen: BLOOD RIGHT FOREARM  Result Value Ref Range Status   Specimen Description BLOOD RIGHT FOREARM  Final   Special Requests   Final    BOTTLES DRAWN AEROBIC AND ANAEROBIC Blood Culture results may not be optimal due to an excessive volume of blood received in culture bottles   Culture   Final    NO GROWTH 5 DAYS Performed at [facility], [address], [place], [place] [address]    Report Status 05/24/2020 FINAL  Final  Urine culture     Status: Abnormal   Collection Time: 05/18/20  6:05 PM   Specimen: Urine, Random  Result Value Ref Range Status   Specimen Description URINE, RANDOM  Final   Special Requests   Final    NONE Performed at [facility], [address], [place], [place] [address]    Culture (A)  Final    80,000 COLONIES/mL ESCHERICHIA COLI Confirmed Extended Spectrum Beta-Lactamase Producer (ESBL).  In bloodstream infections from ESBL organisms, carbapenems are preferred over piperacillin/tazobactam. They are shown to have a lower risk of mortality.    Report Status 05/21/2020 FINAL  Final   Organism ID, Bacteria ESCHERICHIA COLI (A)  Final      Susceptibility   Escherichia coli - MIC*    AMPICILLIN >=32 RESISTANT Resistant     CEFAZOLIN >=64 RESISTANT Resistant     CEFEPIME 16 RESISTANT Resistant     CEFTRIAXONE >=64 RESISTANT Resistant     CIPROFLOXACIN >=4 RESISTANT Resistant     GENTAMICIN >=16 RESISTANT Resistant     IMIPENEM <=0.25 SENSITIVE Sensitive     NITROFURANTOIN <=16 SENSITIVE Sensitive     TRIMETH/SULFA >=320 RESISTANT Resistant     AMPICILLIN/SULBACTAM >=32 RESISTANT Resistant     PIP/TAZO 64 INTERMEDIATE Intermediate     * 80,000 COLONIES/mL ESCHERICHIA COLI  Culture, blood (routine x 2)     Status: None   Collection Time: 05/18/20 10:15 PM   Specimen: BLOOD  Result Value Ref Range Status   Specimen Description BLOOD SITE NOT SPECIFIED  Final   Special Requests   Final     BOTTLES DRAWN AEROBIC AND ANAEROBIC Blood Culture results may not be optimal due to an inadequate volume of blood received in culture bottles   Culture   Final    NO GROWTH 5 DAYS Performed at [facility], [address] [place], [place] [address]    Report Status 05/23/2020 FINAL  Final  MRSA PCR Screening     Status: None   Collection Time: 05/19/20 10:22 PM   Specimen: Nasopharyngeal  Result Value Ref Range Status   MRSA by PCR NEGATIVE NEGATIVE Final    Comment:        The GeneXpert MRSA Assay (FDA approved for NASAL specimens only), is one component of a comprehensive MRSA colonization surveillance program. It is not  intended to diagnose MRSA infection nor to guide or monitor treatment for MRSA infections. Performed at [facility], [address], [place], [place] [address]   Resp Panel by RT-PCR (Flu A&B, Covid) Nasopharyngeal Swab     Status: None   Collection Time: 05/23/20  1:35 PM   Specimen: Nasopharyngeal Swab; Nasopharyngeal(NP) swabs in vial transport medium  Result Value Ref Range Status   SARS Coronavirus 2 by RT PCR NEGATIVE NEGATIVE Final    Comment: (NOTE) SARS-CoV-2 target nucleic acids are NOT DETECTED.  The SARS-CoV-2 RNA is generally detectable in upper respiratory specimens during the acute phase of infection. The lowest concentration of SARS-CoV-2 viral copies this assay can detect is 138 copies/mL. A negative result does not preclude SARS-Cov-2 infection and should not be used as the sole basis for treatment or other patient management decisions. A negative result may occur with  improper specimen collection/handling, submission of specimen other than nasopharyngeal swab, presence of viral mutation(s) within the areas targeted by this assay, and inadequate number of viral copies(<138 copies/mL). A negative result must be combined with clinical observations, patient history, and epidemiological information. The expected result is  Negative.  Fact Sheet for Patients:  BloggerCourse.com  Fact Sheet for Healthcare Providers:  SeriousBroker.it  This test is no t yet approved or cleared by the [place] FDA and  has been authorized for detection and/or diagnosis of SARS-CoV-2 by FDA under an Emergency Use Authorization (EUA). This EUA will remain  in effect (meaning this test can be used) for the duration of the COVID-19 declaration under Section 564(b)(1) of the Act, 21 U.S.C.section 360bbb-3(b)(1), unless the authorization is terminated  or revoked sooner.       Influenza A by PCR NEGATIVE NEGATIVE Final   Influenza B by PCR NEGATIVE NEGATIVE Final    Comment: (NOTE) The Xpert Xpress SARS-CoV-2/FLU/RSV plus assay is intended as an aid in the diagnosis of influenza from Nasopharyngeal swab specimens and should not be used as a sole basis for treatment. Nasal washings and aspirates are unacceptable for Xpert Xpress SARS-CoV-2/FLU/RSV testing.  Fact Sheet for Patients: BloggerCourse.com  Fact Sheet for Healthcare Providers: SeriousBroker.it  This test is not yet approved or cleared by the [place] FDA and has been authorized for detection and/or diagnosis of SARS-CoV-2 by FDA under an Emergency Use Authorization (EUA). This EUA will remain in effect (meaning this test can be used) for the duration of the COVID-19 declaration under Section 564(b)(1) of the Act, 21 U.S.C. section 360bbb-3(b)(1), unless the authorization is terminated or revoked.  Performed at [facility], [address] [place], [place] [address]     Labs: CBC: Recent Labs  Lab 03/19/23 0456 03/20/23 0442 03/22/23 0533  WBC 5.6 6.1 7.2  NEUTROABS 3.8  --   --   HGB 9.7* 9.3* 9.2*  HCT 31.8* 30.5* 29.4*  MCV 95.2 92.7 92.2  PLT 109* 95* 95*   Basic Metabolic Panel: Recent Labs  Lab 03/19/23 0456 03/20/23 0442  03/22/23 0533 03/23/23 0440 03/24/23 0421  NA 140 139 137 137 133*  K 3.7 3.9 3.4* 3.1* 3.5  CL 110 104 103 103 101  CO2 24 24 25 26 25   GLUCOSE 87 73 93 94 90  BUN 16 16 16 15 12   CREATININE 1.29* 1.40* 1.17 1.09 1.19  CALCIUM 8.5* 8.4* 8.0* 8.2* 7.9*   Liver Function Tests: Recent Labs  Lab 03/19/23 0456  AST 22  ALT 17  ALKPHOS 73  BILITOT 0.5  PROT 6.5  ALBUMIN 2.6*   CBG: No results for input(s): "GLUCAP" in the last 168 hours.  Discharge time spent: less than 30 minutes.  Signed: Tobey Grim, MD [facility] 03/25/2023

## 2023-03-25 NOTE — TOC Transition Note (Signed)
 Transition of Care [facility]) - Discharge Note  Patient Details  Name: Brian Burns MRN: [id number] Date of Birth: [birth year]  Transition [facility]) CM/SW Contact:  Ewing Schlein, LCSW Phone Number: 03/25/2023, 1:41 PM  Clinical Narrative: Patient is medically stable to return to [facility]. CSW confirmed discharge with Charlcie Cradle in admissions at [facility]. The number for report is [phone]. Discharge summary, discharge orders, and SNF transfer report faxed to facility in hub. Medical necessity form done; PTAR scheduled. Discharge packet completed. Daughter, Brian Burns, notified of discharge. RN updated. TOC signing off.  Final next level of care: Long Term Nursing Home Barriers to Discharge: Barriers Resolved  Patient Goals and CMS Choice Patient states their goals for this hospitalization and ongoing recovery are:: does not verbalize but nods "yes" to return to SNF Choice offered to / list presented to : NA  Discharge Placement         Patient chooses bed at: Other - please specify in the comment section below: [facility]) Patient to be transferred to facility by: PTAR Name of family member notified: Brian Burns (daughter) Patient and family notified of of transfer: 03/25/23  Discharge Plan and Services Additional resources added to the After Visit Summary for   In-house Referral: Clinical Social Work Post Acute Care Choice: Nursing Home          DME Arranged: N/A DME Agency: NA  Social Drivers of Health (SDOH) Interventions SDOH Screenings   Food Insecurity: No Food Insecurity (03/14/2023)  Housing: Unknown (03/14/2023)  Transportation Needs: No Transportation Needs (03/14/2023)  Utilities: Not At Risk (03/14/2023)  Depression (PHQ2-9): Medium Risk (03/29/2020)  Financial Resource Strain: Low Risk  (06/17/2018)  Social Connections: Moderately Integrated (03/14/2023)  Tobacco Use: Medium Risk (03/21/2023)   Readmission Risk Interventions    03/16/2023    11:02 AM  Readmission Risk Prevention Plan  Transportation Screening Complete  PCP or Specialist Appt within 5-7 Days Complete  Home Care Screening Complete  Medication Review (RN CM) Complete

## 2023-03-25 NOTE — Progress Notes (Signed)
 Attempted to call report twice, states nurse was busy, gave call back number to unit

## 2023-03-26 DIAGNOSIS — K219 Gastro-esophageal reflux disease without esophagitis: Secondary | ICD-10-CM | POA: Diagnosis not present

## 2023-03-26 DIAGNOSIS — I480 Paroxysmal atrial fibrillation: Secondary | ICD-10-CM | POA: Diagnosis not present

## 2023-03-26 DIAGNOSIS — R1311 Dysphagia, oral phase: Secondary | ICD-10-CM | POA: Diagnosis not present

## 2023-03-26 DIAGNOSIS — M6281 Muscle weakness (generalized): Secondary | ICD-10-CM | POA: Diagnosis not present

## 2023-03-26 DIAGNOSIS — I69398 Other sequelae of cerebral infarction: Secondary | ICD-10-CM | POA: Diagnosis not present

## 2023-03-26 DIAGNOSIS — E1140 Type 2 diabetes mellitus with diabetic neuropathy, unspecified: Secondary | ICD-10-CM | POA: Diagnosis not present

## 2023-03-26 DIAGNOSIS — I10 Essential (primary) hypertension: Secondary | ICD-10-CM | POA: Diagnosis not present

## 2023-03-26 DIAGNOSIS — E1159 Type 2 diabetes mellitus with other circulatory complications: Secondary | ICD-10-CM | POA: Diagnosis not present

## 2023-03-26 DIAGNOSIS — I70209 Unspecified atherosclerosis of native arteries of extremities, unspecified extremity: Secondary | ICD-10-CM | POA: Diagnosis not present

## 2023-03-26 DIAGNOSIS — I152 Hypertension secondary to endocrine disorders: Secondary | ICD-10-CM | POA: Diagnosis not present

## 2023-03-26 DIAGNOSIS — R5381 Other malaise: Secondary | ICD-10-CM | POA: Diagnosis not present

## 2023-03-26 DIAGNOSIS — N183 Chronic kidney disease, stage 3 unspecified: Secondary | ICD-10-CM | POA: Diagnosis not present

## 2023-03-26 DIAGNOSIS — D649 Anemia, unspecified: Secondary | ICD-10-CM | POA: Diagnosis not present

## 2023-03-26 DIAGNOSIS — R1314 Dysphagia, pharyngoesophageal phase: Secondary | ICD-10-CM | POA: Diagnosis not present

## 2023-03-26 DIAGNOSIS — N1831 Chronic kidney disease, stage 3a: Secondary | ICD-10-CM | POA: Diagnosis not present

## 2023-03-26 DIAGNOSIS — B3781 Candidal esophagitis: Secondary | ICD-10-CM | POA: Diagnosis not present

## 2023-03-26 DIAGNOSIS — D638 Anemia in other chronic diseases classified elsewhere: Secondary | ICD-10-CM | POA: Diagnosis not present

## 2023-03-26 DIAGNOSIS — K220 Achalasia of cardia: Secondary | ICD-10-CM | POA: Diagnosis not present

## 2023-03-26 DIAGNOSIS — E559 Vitamin D deficiency, unspecified: Secondary | ICD-10-CM | POA: Diagnosis not present

## 2023-03-26 DIAGNOSIS — E1151 Type 2 diabetes mellitus with diabetic peripheral angiopathy without gangrene: Secondary | ICD-10-CM | POA: Diagnosis not present

## 2023-03-26 DIAGNOSIS — E785 Hyperlipidemia, unspecified: Secondary | ICD-10-CM | POA: Diagnosis not present

## 2023-03-26 DIAGNOSIS — E119 Type 2 diabetes mellitus without complications: Secondary | ICD-10-CM | POA: Diagnosis not present

## 2023-03-26 DIAGNOSIS — G894 Chronic pain syndrome: Secondary | ICD-10-CM | POA: Diagnosis not present

## 2023-03-26 DIAGNOSIS — R1313 Dysphagia, pharyngeal phase: Secondary | ICD-10-CM | POA: Diagnosis not present

## 2023-03-26 DIAGNOSIS — I2510 Atherosclerotic heart disease of native coronary artery without angina pectoris: Secondary | ICD-10-CM | POA: Diagnosis not present

## 2023-03-26 DIAGNOSIS — I129 Hypertensive chronic kidney disease with stage 1 through stage 4 chronic kidney disease, or unspecified chronic kidney disease: Secondary | ICD-10-CM | POA: Diagnosis not present

## 2023-03-27 DIAGNOSIS — N1831 Chronic kidney disease, stage 3a: Secondary | ICD-10-CM | POA: Diagnosis not present

## 2023-03-27 DIAGNOSIS — I69398 Other sequelae of cerebral infarction: Secondary | ICD-10-CM | POA: Diagnosis not present

## 2023-03-27 DIAGNOSIS — R1313 Dysphagia, pharyngeal phase: Secondary | ICD-10-CM | POA: Diagnosis not present

## 2023-03-27 DIAGNOSIS — M6281 Muscle weakness (generalized): Secondary | ICD-10-CM | POA: Diagnosis not present

## 2023-03-27 DIAGNOSIS — R5381 Other malaise: Secondary | ICD-10-CM | POA: Diagnosis not present

## 2023-03-27 DIAGNOSIS — R1314 Dysphagia, pharyngoesophageal phase: Secondary | ICD-10-CM | POA: Diagnosis not present

## 2023-03-27 DIAGNOSIS — E1151 Type 2 diabetes mellitus with diabetic peripheral angiopathy without gangrene: Secondary | ICD-10-CM | POA: Diagnosis not present

## 2023-03-27 DIAGNOSIS — K5900 Constipation, unspecified: Secondary | ICD-10-CM | POA: Diagnosis not present

## 2023-03-27 DIAGNOSIS — J309 Allergic rhinitis, unspecified: Secondary | ICD-10-CM | POA: Diagnosis not present

## 2023-03-27 DIAGNOSIS — R1311 Dysphagia, oral phase: Secondary | ICD-10-CM | POA: Diagnosis not present

## 2023-03-27 DIAGNOSIS — I10 Essential (primary) hypertension: Secondary | ICD-10-CM | POA: Diagnosis not present

## 2023-03-28 DIAGNOSIS — I69398 Other sequelae of cerebral infarction: Secondary | ICD-10-CM | POA: Diagnosis not present

## 2023-03-28 DIAGNOSIS — I129 Hypertensive chronic kidney disease with stage 1 through stage 4 chronic kidney disease, or unspecified chronic kidney disease: Secondary | ICD-10-CM | POA: Diagnosis not present

## 2023-03-28 DIAGNOSIS — N1831 Chronic kidney disease, stage 3a: Secondary | ICD-10-CM | POA: Diagnosis not present

## 2023-03-28 DIAGNOSIS — R1311 Dysphagia, oral phase: Secondary | ICD-10-CM | POA: Diagnosis not present

## 2023-03-28 DIAGNOSIS — R5381 Other malaise: Secondary | ICD-10-CM | POA: Diagnosis not present

## 2023-03-28 DIAGNOSIS — K219 Gastro-esophageal reflux disease without esophagitis: Secondary | ICD-10-CM | POA: Diagnosis not present

## 2023-03-28 DIAGNOSIS — R1313 Dysphagia, pharyngeal phase: Secondary | ICD-10-CM | POA: Diagnosis not present

## 2023-03-28 DIAGNOSIS — E1151 Type 2 diabetes mellitus with diabetic peripheral angiopathy without gangrene: Secondary | ICD-10-CM | POA: Diagnosis not present

## 2023-03-28 DIAGNOSIS — R1314 Dysphagia, pharyngoesophageal phase: Secondary | ICD-10-CM | POA: Diagnosis not present

## 2023-03-28 DIAGNOSIS — G894 Chronic pain syndrome: Secondary | ICD-10-CM | POA: Diagnosis not present

## 2023-03-28 DIAGNOSIS — M6281 Muscle weakness (generalized): Secondary | ICD-10-CM | POA: Diagnosis not present

## 2023-03-29 DIAGNOSIS — E1151 Type 2 diabetes mellitus with diabetic peripheral angiopathy without gangrene: Secondary | ICD-10-CM | POA: Diagnosis not present

## 2023-03-29 DIAGNOSIS — R569 Unspecified convulsions: Secondary | ICD-10-CM | POA: Diagnosis not present

## 2023-03-29 DIAGNOSIS — N1831 Chronic kidney disease, stage 3a: Secondary | ICD-10-CM | POA: Diagnosis not present

## 2023-03-29 DIAGNOSIS — E119 Type 2 diabetes mellitus without complications: Secondary | ICD-10-CM | POA: Diagnosis not present

## 2023-03-29 DIAGNOSIS — R1314 Dysphagia, pharyngoesophageal phase: Secondary | ICD-10-CM | POA: Diagnosis not present

## 2023-03-29 DIAGNOSIS — I739 Peripheral vascular disease, unspecified: Secondary | ICD-10-CM | POA: Diagnosis not present

## 2023-03-29 DIAGNOSIS — K5900 Constipation, unspecified: Secondary | ICD-10-CM | POA: Diagnosis not present

## 2023-03-29 DIAGNOSIS — R300 Dysuria: Secondary | ICD-10-CM | POA: Diagnosis not present

## 2023-03-29 DIAGNOSIS — I129 Hypertensive chronic kidney disease with stage 1 through stage 4 chronic kidney disease, or unspecified chronic kidney disease: Secondary | ICD-10-CM | POA: Diagnosis not present

## 2023-03-29 DIAGNOSIS — I69398 Other sequelae of cerebral infarction: Secondary | ICD-10-CM | POA: Diagnosis not present

## 2023-03-29 DIAGNOSIS — M6281 Muscle weakness (generalized): Secondary | ICD-10-CM | POA: Diagnosis not present

## 2023-03-29 DIAGNOSIS — R5381 Other malaise: Secondary | ICD-10-CM | POA: Diagnosis not present

## 2023-03-29 DIAGNOSIS — R1313 Dysphagia, pharyngeal phase: Secondary | ICD-10-CM | POA: Diagnosis not present

## 2023-03-29 DIAGNOSIS — K219 Gastro-esophageal reflux disease without esophagitis: Secondary | ICD-10-CM | POA: Diagnosis not present

## 2023-03-29 DIAGNOSIS — G894 Chronic pain syndrome: Secondary | ICD-10-CM | POA: Diagnosis not present

## 2023-03-29 DIAGNOSIS — G4700 Insomnia, unspecified: Secondary | ICD-10-CM | POA: Diagnosis not present

## 2023-03-29 DIAGNOSIS — R339 Retention of urine, unspecified: Secondary | ICD-10-CM | POA: Diagnosis not present

## 2023-03-29 DIAGNOSIS — R1311 Dysphagia, oral phase: Secondary | ICD-10-CM | POA: Diagnosis not present

## 2023-03-30 DIAGNOSIS — R1311 Dysphagia, oral phase: Secondary | ICD-10-CM | POA: Diagnosis not present

## 2023-03-30 DIAGNOSIS — R5381 Other malaise: Secondary | ICD-10-CM | POA: Diagnosis not present

## 2023-03-30 DIAGNOSIS — M6281 Muscle weakness (generalized): Secondary | ICD-10-CM | POA: Diagnosis not present

## 2023-03-30 DIAGNOSIS — R1314 Dysphagia, pharyngoesophageal phase: Secondary | ICD-10-CM | POA: Diagnosis not present

## 2023-03-30 DIAGNOSIS — N1831 Chronic kidney disease, stage 3a: Secondary | ICD-10-CM | POA: Diagnosis not present

## 2023-03-30 DIAGNOSIS — R1313 Dysphagia, pharyngeal phase: Secondary | ICD-10-CM | POA: Diagnosis not present

## 2023-03-30 DIAGNOSIS — I69398 Other sequelae of cerebral infarction: Secondary | ICD-10-CM | POA: Diagnosis not present

## 2023-03-30 DIAGNOSIS — E1151 Type 2 diabetes mellitus with diabetic peripheral angiopathy without gangrene: Secondary | ICD-10-CM | POA: Diagnosis not present

## 2023-03-31 DIAGNOSIS — I69398 Other sequelae of cerebral infarction: Secondary | ICD-10-CM | POA: Diagnosis not present

## 2023-03-31 DIAGNOSIS — N1831 Chronic kidney disease, stage 3a: Secondary | ICD-10-CM | POA: Diagnosis not present

## 2023-03-31 DIAGNOSIS — R1314 Dysphagia, pharyngoesophageal phase: Secondary | ICD-10-CM | POA: Diagnosis not present

## 2023-03-31 DIAGNOSIS — E1151 Type 2 diabetes mellitus with diabetic peripheral angiopathy without gangrene: Secondary | ICD-10-CM | POA: Diagnosis not present

## 2023-03-31 DIAGNOSIS — R1311 Dysphagia, oral phase: Secondary | ICD-10-CM | POA: Diagnosis not present

## 2023-03-31 DIAGNOSIS — R5381 Other malaise: Secondary | ICD-10-CM | POA: Diagnosis not present

## 2023-03-31 DIAGNOSIS — R1313 Dysphagia, pharyngeal phase: Secondary | ICD-10-CM | POA: Diagnosis not present

## 2023-03-31 DIAGNOSIS — M6281 Muscle weakness (generalized): Secondary | ICD-10-CM | POA: Diagnosis not present

## 2023-04-01 DIAGNOSIS — E1151 Type 2 diabetes mellitus with diabetic peripheral angiopathy without gangrene: Secondary | ICD-10-CM | POA: Diagnosis not present

## 2023-04-01 DIAGNOSIS — R1311 Dysphagia, oral phase: Secondary | ICD-10-CM | POA: Diagnosis not present

## 2023-04-01 DIAGNOSIS — M6281 Muscle weakness (generalized): Secondary | ICD-10-CM | POA: Diagnosis not present

## 2023-04-01 DIAGNOSIS — R1313 Dysphagia, pharyngeal phase: Secondary | ICD-10-CM | POA: Diagnosis not present

## 2023-04-01 DIAGNOSIS — I69398 Other sequelae of cerebral infarction: Secondary | ICD-10-CM | POA: Diagnosis not present

## 2023-04-01 DIAGNOSIS — N1831 Chronic kidney disease, stage 3a: Secondary | ICD-10-CM | POA: Diagnosis not present

## 2023-04-01 DIAGNOSIS — R5381 Other malaise: Secondary | ICD-10-CM | POA: Diagnosis not present

## 2023-04-01 DIAGNOSIS — R1314 Dysphagia, pharyngoesophageal phase: Secondary | ICD-10-CM | POA: Diagnosis not present

## 2023-04-02 DIAGNOSIS — E1151 Type 2 diabetes mellitus with diabetic peripheral angiopathy without gangrene: Secondary | ICD-10-CM | POA: Diagnosis not present

## 2023-04-02 DIAGNOSIS — I69398 Other sequelae of cerebral infarction: Secondary | ICD-10-CM | POA: Diagnosis not present

## 2023-04-02 DIAGNOSIS — R1313 Dysphagia, pharyngeal phase: Secondary | ICD-10-CM | POA: Diagnosis not present

## 2023-04-02 DIAGNOSIS — B3781 Candidal esophagitis: Secondary | ICD-10-CM | POA: Diagnosis not present

## 2023-04-02 DIAGNOSIS — M6281 Muscle weakness (generalized): Secondary | ICD-10-CM | POA: Diagnosis not present

## 2023-04-02 DIAGNOSIS — R1311 Dysphagia, oral phase: Secondary | ICD-10-CM | POA: Diagnosis not present

## 2023-04-02 DIAGNOSIS — G894 Chronic pain syndrome: Secondary | ICD-10-CM | POA: Diagnosis not present

## 2023-04-02 DIAGNOSIS — R1314 Dysphagia, pharyngoesophageal phase: Secondary | ICD-10-CM | POA: Diagnosis not present

## 2023-04-02 DIAGNOSIS — R5381 Other malaise: Secondary | ICD-10-CM | POA: Diagnosis not present

## 2023-04-02 DIAGNOSIS — I129 Hypertensive chronic kidney disease with stage 1 through stage 4 chronic kidney disease, or unspecified chronic kidney disease: Secondary | ICD-10-CM | POA: Diagnosis not present

## 2023-04-02 DIAGNOSIS — N1831 Chronic kidney disease, stage 3a: Secondary | ICD-10-CM | POA: Diagnosis not present

## 2023-04-02 DIAGNOSIS — K219 Gastro-esophageal reflux disease without esophagitis: Secondary | ICD-10-CM | POA: Diagnosis not present

## 2023-04-03 DIAGNOSIS — R1313 Dysphagia, pharyngeal phase: Secondary | ICD-10-CM | POA: Diagnosis not present

## 2023-04-03 DIAGNOSIS — M6281 Muscle weakness (generalized): Secondary | ICD-10-CM | POA: Diagnosis not present

## 2023-04-03 DIAGNOSIS — N1831 Chronic kidney disease, stage 3a: Secondary | ICD-10-CM | POA: Diagnosis not present

## 2023-04-03 DIAGNOSIS — I69398 Other sequelae of cerebral infarction: Secondary | ICD-10-CM | POA: Diagnosis not present

## 2023-04-03 DIAGNOSIS — R5381 Other malaise: Secondary | ICD-10-CM | POA: Diagnosis not present

## 2023-04-03 DIAGNOSIS — R1314 Dysphagia, pharyngoesophageal phase: Secondary | ICD-10-CM | POA: Diagnosis not present

## 2023-04-03 DIAGNOSIS — R1311 Dysphagia, oral phase: Secondary | ICD-10-CM | POA: Diagnosis not present

## 2023-04-03 DIAGNOSIS — E1151 Type 2 diabetes mellitus with diabetic peripheral angiopathy without gangrene: Secondary | ICD-10-CM | POA: Diagnosis not present

## 2023-04-04 DIAGNOSIS — R1313 Dysphagia, pharyngeal phase: Secondary | ICD-10-CM | POA: Diagnosis not present

## 2023-04-04 DIAGNOSIS — N1831 Chronic kidney disease, stage 3a: Secondary | ICD-10-CM | POA: Diagnosis not present

## 2023-04-04 DIAGNOSIS — R1314 Dysphagia, pharyngoesophageal phase: Secondary | ICD-10-CM | POA: Diagnosis not present

## 2023-04-04 DIAGNOSIS — E1151 Type 2 diabetes mellitus with diabetic peripheral angiopathy without gangrene: Secondary | ICD-10-CM | POA: Diagnosis not present

## 2023-04-04 DIAGNOSIS — M6281 Muscle weakness (generalized): Secondary | ICD-10-CM | POA: Diagnosis not present

## 2023-04-04 DIAGNOSIS — I69398 Other sequelae of cerebral infarction: Secondary | ICD-10-CM | POA: Diagnosis not present

## 2023-04-04 DIAGNOSIS — R5381 Other malaise: Secondary | ICD-10-CM | POA: Diagnosis not present

## 2023-04-04 DIAGNOSIS — R1311 Dysphagia, oral phase: Secondary | ICD-10-CM | POA: Diagnosis not present

## 2023-04-05 DIAGNOSIS — R1311 Dysphagia, oral phase: Secondary | ICD-10-CM | POA: Diagnosis not present

## 2023-04-05 DIAGNOSIS — N1831 Chronic kidney disease, stage 3a: Secondary | ICD-10-CM | POA: Diagnosis not present

## 2023-04-05 DIAGNOSIS — M6281 Muscle weakness (generalized): Secondary | ICD-10-CM | POA: Diagnosis not present

## 2023-04-05 DIAGNOSIS — R5381 Other malaise: Secondary | ICD-10-CM | POA: Diagnosis not present

## 2023-04-05 DIAGNOSIS — I69398 Other sequelae of cerebral infarction: Secondary | ICD-10-CM | POA: Diagnosis not present

## 2023-04-05 DIAGNOSIS — E1151 Type 2 diabetes mellitus with diabetic peripheral angiopathy without gangrene: Secondary | ICD-10-CM | POA: Diagnosis not present

## 2023-04-05 DIAGNOSIS — R1314 Dysphagia, pharyngoesophageal phase: Secondary | ICD-10-CM | POA: Diagnosis not present

## 2023-04-05 DIAGNOSIS — R1313 Dysphagia, pharyngeal phase: Secondary | ICD-10-CM | POA: Diagnosis not present

## 2023-04-06 DIAGNOSIS — R1313 Dysphagia, pharyngeal phase: Secondary | ICD-10-CM | POA: Diagnosis not present

## 2023-04-06 DIAGNOSIS — M6281 Muscle weakness (generalized): Secondary | ICD-10-CM | POA: Diagnosis not present

## 2023-04-06 DIAGNOSIS — R5381 Other malaise: Secondary | ICD-10-CM | POA: Diagnosis not present

## 2023-04-06 DIAGNOSIS — R1311 Dysphagia, oral phase: Secondary | ICD-10-CM | POA: Diagnosis not present

## 2023-04-06 DIAGNOSIS — R1314 Dysphagia, pharyngoesophageal phase: Secondary | ICD-10-CM | POA: Diagnosis not present

## 2023-04-06 DIAGNOSIS — I69398 Other sequelae of cerebral infarction: Secondary | ICD-10-CM | POA: Diagnosis not present

## 2023-04-06 DIAGNOSIS — E1151 Type 2 diabetes mellitus with diabetic peripheral angiopathy without gangrene: Secondary | ICD-10-CM | POA: Diagnosis not present

## 2023-04-06 DIAGNOSIS — N1831 Chronic kidney disease, stage 3a: Secondary | ICD-10-CM | POA: Diagnosis not present

## 2023-04-07 DIAGNOSIS — R1311 Dysphagia, oral phase: Secondary | ICD-10-CM | POA: Diagnosis not present

## 2023-04-07 DIAGNOSIS — R5381 Other malaise: Secondary | ICD-10-CM | POA: Diagnosis not present

## 2023-04-07 DIAGNOSIS — N1831 Chronic kidney disease, stage 3a: Secondary | ICD-10-CM | POA: Diagnosis not present

## 2023-04-07 DIAGNOSIS — I69398 Other sequelae of cerebral infarction: Secondary | ICD-10-CM | POA: Diagnosis not present

## 2023-04-07 DIAGNOSIS — E1151 Type 2 diabetes mellitus with diabetic peripheral angiopathy without gangrene: Secondary | ICD-10-CM | POA: Diagnosis not present

## 2023-04-07 DIAGNOSIS — R1313 Dysphagia, pharyngeal phase: Secondary | ICD-10-CM | POA: Diagnosis not present

## 2023-04-07 DIAGNOSIS — M6281 Muscle weakness (generalized): Secondary | ICD-10-CM | POA: Diagnosis not present

## 2023-04-07 DIAGNOSIS — R1314 Dysphagia, pharyngoesophageal phase: Secondary | ICD-10-CM | POA: Diagnosis not present

## 2023-04-08 DIAGNOSIS — R5381 Other malaise: Secondary | ICD-10-CM | POA: Diagnosis not present

## 2023-04-08 DIAGNOSIS — E1151 Type 2 diabetes mellitus with diabetic peripheral angiopathy without gangrene: Secondary | ICD-10-CM | POA: Diagnosis not present

## 2023-04-08 DIAGNOSIS — M6281 Muscle weakness (generalized): Secondary | ICD-10-CM | POA: Diagnosis not present

## 2023-04-08 DIAGNOSIS — N1831 Chronic kidney disease, stage 3a: Secondary | ICD-10-CM | POA: Diagnosis not present

## 2023-04-08 DIAGNOSIS — R1313 Dysphagia, pharyngeal phase: Secondary | ICD-10-CM | POA: Diagnosis not present

## 2023-04-08 DIAGNOSIS — R1311 Dysphagia, oral phase: Secondary | ICD-10-CM | POA: Diagnosis not present

## 2023-04-08 DIAGNOSIS — I69398 Other sequelae of cerebral infarction: Secondary | ICD-10-CM | POA: Diagnosis not present

## 2023-04-08 DIAGNOSIS — R1314 Dysphagia, pharyngoesophageal phase: Secondary | ICD-10-CM | POA: Diagnosis not present

## 2023-04-09 DIAGNOSIS — R1313 Dysphagia, pharyngeal phase: Secondary | ICD-10-CM | POA: Diagnosis not present

## 2023-04-09 DIAGNOSIS — I69398 Other sequelae of cerebral infarction: Secondary | ICD-10-CM | POA: Diagnosis not present

## 2023-04-09 DIAGNOSIS — G4700 Insomnia, unspecified: Secondary | ICD-10-CM | POA: Diagnosis not present

## 2023-04-09 DIAGNOSIS — M6281 Muscle weakness (generalized): Secondary | ICD-10-CM | POA: Diagnosis not present

## 2023-04-09 DIAGNOSIS — R5381 Other malaise: Secondary | ICD-10-CM | POA: Diagnosis not present

## 2023-04-09 DIAGNOSIS — R1311 Dysphagia, oral phase: Secondary | ICD-10-CM | POA: Diagnosis not present

## 2023-04-09 DIAGNOSIS — N1831 Chronic kidney disease, stage 3a: Secondary | ICD-10-CM | POA: Diagnosis not present

## 2023-04-09 DIAGNOSIS — R1314 Dysphagia, pharyngoesophageal phase: Secondary | ICD-10-CM | POA: Diagnosis not present

## 2023-04-09 DIAGNOSIS — E1151 Type 2 diabetes mellitus with diabetic peripheral angiopathy without gangrene: Secondary | ICD-10-CM | POA: Diagnosis not present

## 2023-04-10 DIAGNOSIS — M6281 Muscle weakness (generalized): Secondary | ICD-10-CM | POA: Diagnosis not present

## 2023-04-10 DIAGNOSIS — K219 Gastro-esophageal reflux disease without esophagitis: Secondary | ICD-10-CM | POA: Diagnosis not present

## 2023-04-10 DIAGNOSIS — G894 Chronic pain syndrome: Secondary | ICD-10-CM | POA: Diagnosis not present

## 2023-04-10 DIAGNOSIS — R1311 Dysphagia, oral phase: Secondary | ICD-10-CM | POA: Diagnosis not present

## 2023-04-10 DIAGNOSIS — I69398 Other sequelae of cerebral infarction: Secondary | ICD-10-CM | POA: Diagnosis not present

## 2023-04-10 DIAGNOSIS — E1151 Type 2 diabetes mellitus with diabetic peripheral angiopathy without gangrene: Secondary | ICD-10-CM | POA: Diagnosis not present

## 2023-04-10 DIAGNOSIS — R1314 Dysphagia, pharyngoesophageal phase: Secondary | ICD-10-CM | POA: Diagnosis not present

## 2023-04-10 DIAGNOSIS — N1831 Chronic kidney disease, stage 3a: Secondary | ICD-10-CM | POA: Diagnosis not present

## 2023-04-10 DIAGNOSIS — B3781 Candidal esophagitis: Secondary | ICD-10-CM | POA: Diagnosis not present

## 2023-04-10 DIAGNOSIS — I129 Hypertensive chronic kidney disease with stage 1 through stage 4 chronic kidney disease, or unspecified chronic kidney disease: Secondary | ICD-10-CM | POA: Diagnosis not present

## 2023-04-10 DIAGNOSIS — R1313 Dysphagia, pharyngeal phase: Secondary | ICD-10-CM | POA: Diagnosis not present

## 2023-04-10 DIAGNOSIS — R1319 Other dysphagia: Secondary | ICD-10-CM | POA: Diagnosis not present

## 2023-04-10 DIAGNOSIS — R5381 Other malaise: Secondary | ICD-10-CM | POA: Diagnosis not present

## 2023-04-11 DIAGNOSIS — R1311 Dysphagia, oral phase: Secondary | ICD-10-CM | POA: Diagnosis not present

## 2023-04-11 DIAGNOSIS — E1151 Type 2 diabetes mellitus with diabetic peripheral angiopathy without gangrene: Secondary | ICD-10-CM | POA: Diagnosis not present

## 2023-04-11 DIAGNOSIS — R1313 Dysphagia, pharyngeal phase: Secondary | ICD-10-CM | POA: Diagnosis not present

## 2023-04-11 DIAGNOSIS — R5381 Other malaise: Secondary | ICD-10-CM | POA: Diagnosis not present

## 2023-04-11 DIAGNOSIS — M6281 Muscle weakness (generalized): Secondary | ICD-10-CM | POA: Diagnosis not present

## 2023-04-11 DIAGNOSIS — R1314 Dysphagia, pharyngoesophageal phase: Secondary | ICD-10-CM | POA: Diagnosis not present

## 2023-04-11 DIAGNOSIS — N1831 Chronic kidney disease, stage 3a: Secondary | ICD-10-CM | POA: Diagnosis not present

## 2023-04-11 DIAGNOSIS — I69398 Other sequelae of cerebral infarction: Secondary | ICD-10-CM | POA: Diagnosis not present

## 2023-04-12 DIAGNOSIS — R1314 Dysphagia, pharyngoesophageal phase: Secondary | ICD-10-CM | POA: Diagnosis not present

## 2023-04-12 DIAGNOSIS — R5381 Other malaise: Secondary | ICD-10-CM | POA: Diagnosis not present

## 2023-04-12 DIAGNOSIS — R1311 Dysphagia, oral phase: Secondary | ICD-10-CM | POA: Diagnosis not present

## 2023-04-12 DIAGNOSIS — I739 Peripheral vascular disease, unspecified: Secondary | ICD-10-CM | POA: Diagnosis not present

## 2023-04-12 DIAGNOSIS — K219 Gastro-esophageal reflux disease without esophagitis: Secondary | ICD-10-CM | POA: Diagnosis not present

## 2023-04-12 DIAGNOSIS — N1831 Chronic kidney disease, stage 3a: Secondary | ICD-10-CM | POA: Diagnosis not present

## 2023-04-12 DIAGNOSIS — E119 Type 2 diabetes mellitus without complications: Secondary | ICD-10-CM | POA: Diagnosis not present

## 2023-04-12 DIAGNOSIS — G4700 Insomnia, unspecified: Secondary | ICD-10-CM | POA: Diagnosis not present

## 2023-04-12 DIAGNOSIS — E1151 Type 2 diabetes mellitus with diabetic peripheral angiopathy without gangrene: Secondary | ICD-10-CM | POA: Diagnosis not present

## 2023-04-12 DIAGNOSIS — I69398 Other sequelae of cerebral infarction: Secondary | ICD-10-CM | POA: Diagnosis not present

## 2023-04-12 DIAGNOSIS — R569 Unspecified convulsions: Secondary | ICD-10-CM | POA: Diagnosis not present

## 2023-04-12 DIAGNOSIS — M6281 Muscle weakness (generalized): Secondary | ICD-10-CM | POA: Diagnosis not present

## 2023-04-12 DIAGNOSIS — I129 Hypertensive chronic kidney disease with stage 1 through stage 4 chronic kidney disease, or unspecified chronic kidney disease: Secondary | ICD-10-CM | POA: Diagnosis not present

## 2023-04-12 DIAGNOSIS — R1313 Dysphagia, pharyngeal phase: Secondary | ICD-10-CM | POA: Diagnosis not present

## 2023-04-12 DIAGNOSIS — G894 Chronic pain syndrome: Secondary | ICD-10-CM | POA: Diagnosis not present

## 2023-04-14 DIAGNOSIS — R1311 Dysphagia, oral phase: Secondary | ICD-10-CM | POA: Diagnosis not present

## 2023-04-14 DIAGNOSIS — I69398 Other sequelae of cerebral infarction: Secondary | ICD-10-CM | POA: Diagnosis not present

## 2023-04-14 DIAGNOSIS — N1831 Chronic kidney disease, stage 3a: Secondary | ICD-10-CM | POA: Diagnosis not present

## 2023-04-14 DIAGNOSIS — R1313 Dysphagia, pharyngeal phase: Secondary | ICD-10-CM | POA: Diagnosis not present

## 2023-04-14 DIAGNOSIS — R1314 Dysphagia, pharyngoesophageal phase: Secondary | ICD-10-CM | POA: Diagnosis not present

## 2023-04-14 DIAGNOSIS — E119 Type 2 diabetes mellitus without complications: Secondary | ICD-10-CM | POA: Diagnosis not present

## 2023-04-14 DIAGNOSIS — R5381 Other malaise: Secondary | ICD-10-CM | POA: Diagnosis not present

## 2023-04-14 DIAGNOSIS — M6281 Muscle weakness (generalized): Secondary | ICD-10-CM | POA: Diagnosis not present

## 2023-04-14 DIAGNOSIS — E1151 Type 2 diabetes mellitus with diabetic peripheral angiopathy without gangrene: Secondary | ICD-10-CM | POA: Diagnosis not present

## 2023-04-15 DIAGNOSIS — R1313 Dysphagia, pharyngeal phase: Secondary | ICD-10-CM | POA: Diagnosis not present

## 2023-04-15 DIAGNOSIS — R5381 Other malaise: Secondary | ICD-10-CM | POA: Diagnosis not present

## 2023-04-15 DIAGNOSIS — G894 Chronic pain syndrome: Secondary | ICD-10-CM | POA: Diagnosis not present

## 2023-04-15 DIAGNOSIS — K219 Gastro-esophageal reflux disease without esophagitis: Secondary | ICD-10-CM | POA: Diagnosis not present

## 2023-04-15 DIAGNOSIS — I129 Hypertensive chronic kidney disease with stage 1 through stage 4 chronic kidney disease, or unspecified chronic kidney disease: Secondary | ICD-10-CM | POA: Diagnosis not present

## 2023-04-15 DIAGNOSIS — I69398 Other sequelae of cerebral infarction: Secondary | ICD-10-CM | POA: Diagnosis not present

## 2023-04-15 DIAGNOSIS — E1151 Type 2 diabetes mellitus with diabetic peripheral angiopathy without gangrene: Secondary | ICD-10-CM | POA: Diagnosis not present

## 2023-04-15 DIAGNOSIS — N1831 Chronic kidney disease, stage 3a: Secondary | ICD-10-CM | POA: Diagnosis not present

## 2023-04-15 DIAGNOSIS — M6281 Muscle weakness (generalized): Secondary | ICD-10-CM | POA: Diagnosis not present

## 2023-04-15 DIAGNOSIS — E119 Type 2 diabetes mellitus without complications: Secondary | ICD-10-CM | POA: Diagnosis not present

## 2023-04-15 DIAGNOSIS — R1311 Dysphagia, oral phase: Secondary | ICD-10-CM | POA: Diagnosis not present

## 2023-04-15 DIAGNOSIS — R1314 Dysphagia, pharyngoesophageal phase: Secondary | ICD-10-CM | POA: Diagnosis not present

## 2023-04-16 DIAGNOSIS — R1313 Dysphagia, pharyngeal phase: Secondary | ICD-10-CM | POA: Diagnosis not present

## 2023-04-16 DIAGNOSIS — I69398 Other sequelae of cerebral infarction: Secondary | ICD-10-CM | POA: Diagnosis not present

## 2023-04-16 DIAGNOSIS — R1314 Dysphagia, pharyngoesophageal phase: Secondary | ICD-10-CM | POA: Diagnosis not present

## 2023-04-16 DIAGNOSIS — R410 Disorientation, unspecified: Secondary | ICD-10-CM | POA: Diagnosis not present

## 2023-04-16 DIAGNOSIS — R5381 Other malaise: Secondary | ICD-10-CM | POA: Diagnosis not present

## 2023-04-16 DIAGNOSIS — R1311 Dysphagia, oral phase: Secondary | ICD-10-CM | POA: Diagnosis not present

## 2023-04-16 DIAGNOSIS — E1151 Type 2 diabetes mellitus with diabetic peripheral angiopathy without gangrene: Secondary | ICD-10-CM | POA: Diagnosis not present

## 2023-04-16 DIAGNOSIS — N1831 Chronic kidney disease, stage 3a: Secondary | ICD-10-CM | POA: Diagnosis not present

## 2023-04-16 DIAGNOSIS — M6281 Muscle weakness (generalized): Secondary | ICD-10-CM | POA: Diagnosis not present

## 2023-04-16 DIAGNOSIS — R296 Repeated falls: Secondary | ICD-10-CM | POA: Diagnosis not present

## 2023-04-17 DIAGNOSIS — R1311 Dysphagia, oral phase: Secondary | ICD-10-CM | POA: Diagnosis not present

## 2023-04-17 DIAGNOSIS — N183 Chronic kidney disease, stage 3 unspecified: Secondary | ICD-10-CM | POA: Diagnosis not present

## 2023-04-17 DIAGNOSIS — D638 Anemia in other chronic diseases classified elsewhere: Secondary | ICD-10-CM | POA: Diagnosis not present

## 2023-04-17 DIAGNOSIS — R1313 Dysphagia, pharyngeal phase: Secondary | ICD-10-CM | POA: Diagnosis not present

## 2023-04-17 DIAGNOSIS — R1314 Dysphagia, pharyngoesophageal phase: Secondary | ICD-10-CM | POA: Diagnosis not present

## 2023-04-17 DIAGNOSIS — R5382 Chronic fatigue, unspecified: Secondary | ICD-10-CM | POA: Diagnosis not present

## 2023-04-17 DIAGNOSIS — I69398 Other sequelae of cerebral infarction: Secondary | ICD-10-CM | POA: Diagnosis not present

## 2023-04-17 DIAGNOSIS — N390 Urinary tract infection, site not specified: Secondary | ICD-10-CM | POA: Diagnosis not present

## 2023-04-17 DIAGNOSIS — I129 Hypertensive chronic kidney disease with stage 1 through stage 4 chronic kidney disease, or unspecified chronic kidney disease: Secondary | ICD-10-CM | POA: Diagnosis not present

## 2023-04-17 DIAGNOSIS — E1151 Type 2 diabetes mellitus with diabetic peripheral angiopathy without gangrene: Secondary | ICD-10-CM | POA: Diagnosis not present

## 2023-04-17 DIAGNOSIS — G894 Chronic pain syndrome: Secondary | ICD-10-CM | POA: Diagnosis not present

## 2023-04-17 DIAGNOSIS — M6281 Muscle weakness (generalized): Secondary | ICD-10-CM | POA: Diagnosis not present

## 2023-04-17 DIAGNOSIS — R5381 Other malaise: Secondary | ICD-10-CM | POA: Diagnosis not present

## 2023-04-17 DIAGNOSIS — N1831 Chronic kidney disease, stage 3a: Secondary | ICD-10-CM | POA: Diagnosis not present

## 2023-04-18 DIAGNOSIS — G894 Chronic pain syndrome: Secondary | ICD-10-CM | POA: Diagnosis not present

## 2023-04-18 DIAGNOSIS — E119 Type 2 diabetes mellitus without complications: Secondary | ICD-10-CM | POA: Diagnosis not present

## 2023-04-18 DIAGNOSIS — R1319 Other dysphagia: Secondary | ICD-10-CM | POA: Diagnosis not present

## 2023-04-18 DIAGNOSIS — I69398 Other sequelae of cerebral infarction: Secondary | ICD-10-CM | POA: Diagnosis not present

## 2023-04-18 DIAGNOSIS — M6281 Muscle weakness (generalized): Secondary | ICD-10-CM | POA: Diagnosis not present

## 2023-04-18 DIAGNOSIS — E785 Hyperlipidemia, unspecified: Secondary | ICD-10-CM | POA: Diagnosis not present

## 2023-04-18 DIAGNOSIS — R5381 Other malaise: Secondary | ICD-10-CM | POA: Diagnosis not present

## 2023-04-18 DIAGNOSIS — E1151 Type 2 diabetes mellitus with diabetic peripheral angiopathy without gangrene: Secondary | ICD-10-CM | POA: Diagnosis not present

## 2023-04-18 DIAGNOSIS — N1831 Chronic kidney disease, stage 3a: Secondary | ICD-10-CM | POA: Diagnosis not present

## 2023-04-18 DIAGNOSIS — I480 Paroxysmal atrial fibrillation: Secondary | ICD-10-CM | POA: Diagnosis not present

## 2023-04-18 DIAGNOSIS — E1159 Type 2 diabetes mellitus with other circulatory complications: Secondary | ICD-10-CM | POA: Diagnosis not present

## 2023-04-18 DIAGNOSIS — I129 Hypertensive chronic kidney disease with stage 1 through stage 4 chronic kidney disease, or unspecified chronic kidney disease: Secondary | ICD-10-CM | POA: Diagnosis not present

## 2023-04-18 DIAGNOSIS — D638 Anemia in other chronic diseases classified elsewhere: Secondary | ICD-10-CM | POA: Diagnosis not present

## 2023-04-18 DIAGNOSIS — R1314 Dysphagia, pharyngoesophageal phase: Secondary | ICD-10-CM | POA: Diagnosis not present

## 2023-04-18 DIAGNOSIS — D649 Anemia, unspecified: Secondary | ICD-10-CM | POA: Diagnosis not present

## 2023-04-18 DIAGNOSIS — E559 Vitamin D deficiency, unspecified: Secondary | ICD-10-CM | POA: Diagnosis not present

## 2023-04-18 DIAGNOSIS — R1311 Dysphagia, oral phase: Secondary | ICD-10-CM | POA: Diagnosis not present

## 2023-04-18 DIAGNOSIS — I152 Hypertension secondary to endocrine disorders: Secondary | ICD-10-CM | POA: Diagnosis not present

## 2023-04-18 DIAGNOSIS — I10 Essential (primary) hypertension: Secondary | ICD-10-CM | POA: Diagnosis not present

## 2023-04-18 DIAGNOSIS — N183 Chronic kidney disease, stage 3 unspecified: Secondary | ICD-10-CM | POA: Diagnosis not present

## 2023-04-18 DIAGNOSIS — I2510 Atherosclerotic heart disease of native coronary artery without angina pectoris: Secondary | ICD-10-CM | POA: Diagnosis not present

## 2023-04-18 DIAGNOSIS — E1140 Type 2 diabetes mellitus with diabetic neuropathy, unspecified: Secondary | ICD-10-CM | POA: Diagnosis not present

## 2023-04-18 DIAGNOSIS — R1313 Dysphagia, pharyngeal phase: Secondary | ICD-10-CM | POA: Diagnosis not present

## 2023-04-20 DIAGNOSIS — R569 Unspecified convulsions: Secondary | ICD-10-CM | POA: Diagnosis not present

## 2023-04-20 DIAGNOSIS — D638 Anemia in other chronic diseases classified elsewhere: Secondary | ICD-10-CM | POA: Diagnosis not present

## 2023-04-20 DIAGNOSIS — I129 Hypertensive chronic kidney disease with stage 1 through stage 4 chronic kidney disease, or unspecified chronic kidney disease: Secondary | ICD-10-CM | POA: Diagnosis not present

## 2023-04-20 DIAGNOSIS — I739 Peripheral vascular disease, unspecified: Secondary | ICD-10-CM | POA: Diagnosis not present

## 2023-04-20 DIAGNOSIS — G4700 Insomnia, unspecified: Secondary | ICD-10-CM | POA: Diagnosis not present

## 2023-04-20 DIAGNOSIS — K219 Gastro-esophageal reflux disease without esophagitis: Secondary | ICD-10-CM | POA: Diagnosis not present

## 2023-04-20 DIAGNOSIS — G894 Chronic pain syndrome: Secondary | ICD-10-CM | POA: Diagnosis not present

## 2023-04-20 DIAGNOSIS — R1311 Dysphagia, oral phase: Secondary | ICD-10-CM | POA: Diagnosis not present

## 2023-04-25 DIAGNOSIS — I129 Hypertensive chronic kidney disease with stage 1 through stage 4 chronic kidney disease, or unspecified chronic kidney disease: Secondary | ICD-10-CM | POA: Diagnosis not present

## 2023-04-25 DIAGNOSIS — G894 Chronic pain syndrome: Secondary | ICD-10-CM | POA: Diagnosis not present

## 2023-04-25 DIAGNOSIS — E43 Unspecified severe protein-calorie malnutrition: Secondary | ICD-10-CM | POA: Diagnosis not present

## 2023-05-08 DIAGNOSIS — E43 Unspecified severe protein-calorie malnutrition: Secondary | ICD-10-CM | POA: Diagnosis not present

## 2023-05-08 DIAGNOSIS — R1311 Dysphagia, oral phase: Secondary | ICD-10-CM | POA: Diagnosis not present

## 2023-05-08 DIAGNOSIS — K219 Gastro-esophageal reflux disease without esophagitis: Secondary | ICD-10-CM | POA: Diagnosis not present

## 2023-05-08 DIAGNOSIS — G894 Chronic pain syndrome: Secondary | ICD-10-CM | POA: Diagnosis not present

## 2023-05-15 DIAGNOSIS — I10 Essential (primary) hypertension: Secondary | ICD-10-CM | POA: Diagnosis not present

## 2023-05-15 DIAGNOSIS — E1151 Type 2 diabetes mellitus with diabetic peripheral angiopathy without gangrene: Secondary | ICD-10-CM | POA: Diagnosis not present

## 2023-05-15 DIAGNOSIS — R296 Repeated falls: Secondary | ICD-10-CM | POA: Diagnosis not present

## 2023-05-22 DIAGNOSIS — R1311 Dysphagia, oral phase: Secondary | ICD-10-CM | POA: Diagnosis not present

## 2023-05-22 DIAGNOSIS — E43 Unspecified severe protein-calorie malnutrition: Secondary | ICD-10-CM | POA: Diagnosis not present

## 2023-05-22 DIAGNOSIS — G894 Chronic pain syndrome: Secondary | ICD-10-CM | POA: Diagnosis not present

## 2023-05-22 DIAGNOSIS — I129 Hypertensive chronic kidney disease with stage 1 through stage 4 chronic kidney disease, or unspecified chronic kidney disease: Secondary | ICD-10-CM | POA: Diagnosis not present

## 2023-05-26 DIAGNOSIS — G4700 Insomnia, unspecified: Secondary | ICD-10-CM | POA: Diagnosis not present

## 2023-05-28 DIAGNOSIS — R1319 Other dysphagia: Secondary | ICD-10-CM | POA: Diagnosis not present

## 2023-05-28 DIAGNOSIS — G894 Chronic pain syndrome: Secondary | ICD-10-CM | POA: Diagnosis not present

## 2023-05-28 DIAGNOSIS — E43 Unspecified severe protein-calorie malnutrition: Secondary | ICD-10-CM | POA: Diagnosis not present

## 2023-06-01 DIAGNOSIS — R569 Unspecified convulsions: Secondary | ICD-10-CM | POA: Diagnosis not present

## 2023-06-01 DIAGNOSIS — K5900 Constipation, unspecified: Secondary | ICD-10-CM | POA: Diagnosis not present

## 2023-06-01 DIAGNOSIS — E43 Unspecified severe protein-calorie malnutrition: Secondary | ICD-10-CM | POA: Diagnosis not present

## 2023-06-01 DIAGNOSIS — G894 Chronic pain syndrome: Secondary | ICD-10-CM | POA: Diagnosis not present

## 2023-06-01 DIAGNOSIS — K219 Gastro-esophageal reflux disease without esophagitis: Secondary | ICD-10-CM | POA: Diagnosis not present

## 2023-06-01 DIAGNOSIS — K220 Achalasia of cardia: Secondary | ICD-10-CM | POA: Diagnosis not present

## 2023-06-01 DIAGNOSIS — I129 Hypertensive chronic kidney disease with stage 1 through stage 4 chronic kidney disease, or unspecified chronic kidney disease: Secondary | ICD-10-CM | POA: Diagnosis not present

## 2023-06-01 DIAGNOSIS — R1319 Other dysphagia: Secondary | ICD-10-CM | POA: Diagnosis not present

## 2023-06-29 DIAGNOSIS — I2510 Atherosclerotic heart disease of native coronary artery without angina pectoris: Secondary | ICD-10-CM | POA: Diagnosis not present

## 2023-06-29 DIAGNOSIS — E785 Hyperlipidemia, unspecified: Secondary | ICD-10-CM | POA: Diagnosis not present

## 2023-06-29 DIAGNOSIS — D631 Anemia in chronic kidney disease: Secondary | ICD-10-CM | POA: Diagnosis not present

## 2023-06-29 DIAGNOSIS — M6281 Muscle weakness (generalized): Secondary | ICD-10-CM | POA: Diagnosis not present

## 2023-06-29 DIAGNOSIS — K5900 Constipation, unspecified: Secondary | ICD-10-CM | POA: Diagnosis not present

## 2023-06-29 DIAGNOSIS — I10 Essential (primary) hypertension: Secondary | ICD-10-CM | POA: Diagnosis not present

## 2023-06-29 DIAGNOSIS — G4700 Insomnia, unspecified: Secondary | ICD-10-CM | POA: Diagnosis not present

## 2023-06-29 DIAGNOSIS — R1312 Dysphagia, oropharyngeal phase: Secondary | ICD-10-CM | POA: Diagnosis not present

## 2023-06-29 DIAGNOSIS — N1832 Chronic kidney disease, stage 3b: Secondary | ICD-10-CM | POA: Diagnosis not present

## 2023-06-29 DIAGNOSIS — K219 Gastro-esophageal reflux disease without esophagitis: Secondary | ICD-10-CM | POA: Diagnosis not present

## 2023-07-05 DIAGNOSIS — I10 Essential (primary) hypertension: Secondary | ICD-10-CM | POA: Diagnosis not present

## 2023-07-05 DIAGNOSIS — M6281 Muscle weakness (generalized): Secondary | ICD-10-CM | POA: Diagnosis not present

## 2023-07-05 DIAGNOSIS — K5900 Constipation, unspecified: Secondary | ICD-10-CM | POA: Diagnosis not present

## 2023-07-05 DIAGNOSIS — R1312 Dysphagia, oropharyngeal phase: Secondary | ICD-10-CM | POA: Diagnosis not present

## 2023-07-05 DIAGNOSIS — K219 Gastro-esophageal reflux disease without esophagitis: Secondary | ICD-10-CM | POA: Diagnosis not present

## 2023-07-05 DIAGNOSIS — I480 Paroxysmal atrial fibrillation: Secondary | ICD-10-CM | POA: Diagnosis not present

## 2023-07-05 DIAGNOSIS — G4700 Insomnia, unspecified: Secondary | ICD-10-CM | POA: Diagnosis not present

## 2023-07-07 DIAGNOSIS — I480 Paroxysmal atrial fibrillation: Secondary | ICD-10-CM | POA: Diagnosis not present

## 2023-07-07 DIAGNOSIS — R197 Diarrhea, unspecified: Secondary | ICD-10-CM | POA: Diagnosis not present

## 2023-07-07 DIAGNOSIS — K219 Gastro-esophageal reflux disease without esophagitis: Secondary | ICD-10-CM | POA: Diagnosis not present

## 2023-07-08 DIAGNOSIS — I739 Peripheral vascular disease, unspecified: Secondary | ICD-10-CM | POA: Diagnosis not present

## 2023-07-08 DIAGNOSIS — L603 Nail dystrophy: Secondary | ICD-10-CM | POA: Diagnosis not present

## 2023-07-08 DIAGNOSIS — L602 Onychogryphosis: Secondary | ICD-10-CM | POA: Diagnosis not present

## 2023-07-10 DIAGNOSIS — K219 Gastro-esophageal reflux disease without esophagitis: Secondary | ICD-10-CM | POA: Diagnosis not present

## 2023-07-10 DIAGNOSIS — I480 Paroxysmal atrial fibrillation: Secondary | ICD-10-CM | POA: Diagnosis not present

## 2023-07-10 DIAGNOSIS — R197 Diarrhea, unspecified: Secondary | ICD-10-CM | POA: Diagnosis not present

## 2023-07-12 DIAGNOSIS — K5900 Constipation, unspecified: Secondary | ICD-10-CM | POA: Diagnosis not present

## 2023-07-12 DIAGNOSIS — I10 Essential (primary) hypertension: Secondary | ICD-10-CM | POA: Diagnosis not present

## 2023-07-12 DIAGNOSIS — M6281 Muscle weakness (generalized): Secondary | ICD-10-CM | POA: Diagnosis not present

## 2023-07-12 DIAGNOSIS — I480 Paroxysmal atrial fibrillation: Secondary | ICD-10-CM | POA: Diagnosis not present

## 2023-07-12 DIAGNOSIS — K219 Gastro-esophageal reflux disease without esophagitis: Secondary | ICD-10-CM | POA: Diagnosis not present

## 2023-07-12 DIAGNOSIS — G4700 Insomnia, unspecified: Secondary | ICD-10-CM | POA: Diagnosis not present

## 2023-07-12 DIAGNOSIS — R1312 Dysphagia, oropharyngeal phase: Secondary | ICD-10-CM | POA: Diagnosis not present

## 2023-07-19 DIAGNOSIS — K5900 Constipation, unspecified: Secondary | ICD-10-CM | POA: Diagnosis not present

## 2023-07-19 DIAGNOSIS — K219 Gastro-esophageal reflux disease without esophagitis: Secondary | ICD-10-CM | POA: Diagnosis not present

## 2023-07-19 DIAGNOSIS — I480 Paroxysmal atrial fibrillation: Secondary | ICD-10-CM | POA: Diagnosis not present

## 2023-07-19 DIAGNOSIS — M6281 Muscle weakness (generalized): Secondary | ICD-10-CM | POA: Diagnosis not present

## 2023-07-19 DIAGNOSIS — R1312 Dysphagia, oropharyngeal phase: Secondary | ICD-10-CM | POA: Diagnosis not present

## 2023-07-19 DIAGNOSIS — G4700 Insomnia, unspecified: Secondary | ICD-10-CM | POA: Diagnosis not present

## 2023-07-19 DIAGNOSIS — I10 Essential (primary) hypertension: Secondary | ICD-10-CM | POA: Diagnosis not present

## 2023-07-26 DIAGNOSIS — K5900 Constipation, unspecified: Secondary | ICD-10-CM | POA: Diagnosis not present

## 2023-07-26 DIAGNOSIS — I480 Paroxysmal atrial fibrillation: Secondary | ICD-10-CM | POA: Diagnosis not present

## 2023-07-26 DIAGNOSIS — R1312 Dysphagia, oropharyngeal phase: Secondary | ICD-10-CM | POA: Diagnosis not present

## 2023-07-26 DIAGNOSIS — K219 Gastro-esophageal reflux disease without esophagitis: Secondary | ICD-10-CM | POA: Diagnosis not present

## 2023-07-26 DIAGNOSIS — G4700 Insomnia, unspecified: Secondary | ICD-10-CM | POA: Diagnosis not present

## 2023-07-26 DIAGNOSIS — I10 Essential (primary) hypertension: Secondary | ICD-10-CM | POA: Diagnosis not present

## 2023-07-26 DIAGNOSIS — M6281 Muscle weakness (generalized): Secondary | ICD-10-CM | POA: Diagnosis not present

## 2023-08-10 DIAGNOSIS — I480 Paroxysmal atrial fibrillation: Secondary | ICD-10-CM | POA: Diagnosis not present

## 2023-08-10 DIAGNOSIS — R1312 Dysphagia, oropharyngeal phase: Secondary | ICD-10-CM | POA: Diagnosis not present

## 2023-08-10 DIAGNOSIS — K219 Gastro-esophageal reflux disease without esophagitis: Secondary | ICD-10-CM | POA: Diagnosis not present

## 2023-08-10 DIAGNOSIS — M6281 Muscle weakness (generalized): Secondary | ICD-10-CM | POA: Diagnosis not present

## 2023-08-10 DIAGNOSIS — G4700 Insomnia, unspecified: Secondary | ICD-10-CM | POA: Diagnosis not present

## 2023-08-10 DIAGNOSIS — I10 Essential (primary) hypertension: Secondary | ICD-10-CM | POA: Diagnosis not present

## 2023-08-10 DIAGNOSIS — K5900 Constipation, unspecified: Secondary | ICD-10-CM | POA: Diagnosis not present

## 2023-08-12 DIAGNOSIS — K219 Gastro-esophageal reflux disease without esophagitis: Secondary | ICD-10-CM | POA: Diagnosis not present

## 2023-08-12 DIAGNOSIS — I10 Essential (primary) hypertension: Secondary | ICD-10-CM | POA: Diagnosis not present

## 2023-08-12 DIAGNOSIS — I480 Paroxysmal atrial fibrillation: Secondary | ICD-10-CM | POA: Diagnosis not present

## 2023-08-16 DIAGNOSIS — K219 Gastro-esophageal reflux disease without esophagitis: Secondary | ICD-10-CM | POA: Diagnosis not present

## 2023-08-16 DIAGNOSIS — M6281 Muscle weakness (generalized): Secondary | ICD-10-CM | POA: Diagnosis not present

## 2023-08-16 DIAGNOSIS — G4700 Insomnia, unspecified: Secondary | ICD-10-CM | POA: Diagnosis not present

## 2023-08-16 DIAGNOSIS — I480 Paroxysmal atrial fibrillation: Secondary | ICD-10-CM | POA: Diagnosis not present

## 2023-08-16 DIAGNOSIS — I10 Essential (primary) hypertension: Secondary | ICD-10-CM | POA: Diagnosis not present

## 2023-08-16 DIAGNOSIS — K5900 Constipation, unspecified: Secondary | ICD-10-CM | POA: Diagnosis not present

## 2023-08-16 DIAGNOSIS — R1312 Dysphagia, oropharyngeal phase: Secondary | ICD-10-CM | POA: Diagnosis not present

## 2023-08-18 DIAGNOSIS — I480 Paroxysmal atrial fibrillation: Secondary | ICD-10-CM | POA: Diagnosis not present

## 2023-08-18 DIAGNOSIS — K219 Gastro-esophageal reflux disease without esophagitis: Secondary | ICD-10-CM | POA: Diagnosis not present

## 2023-08-18 DIAGNOSIS — I10 Essential (primary) hypertension: Secondary | ICD-10-CM | POA: Diagnosis not present

## 2023-08-22 DIAGNOSIS — L89890 Pressure ulcer of other site, unstageable: Secondary | ICD-10-CM | POA: Diagnosis not present

## 2023-08-22 DIAGNOSIS — Z7409 Other reduced mobility: Secondary | ICD-10-CM | POA: Diagnosis not present

## 2023-08-22 DIAGNOSIS — E1151 Type 2 diabetes mellitus with diabetic peripheral angiopathy without gangrene: Secondary | ICD-10-CM | POA: Diagnosis not present

## 2023-08-22 DIAGNOSIS — R2681 Unsteadiness on feet: Secondary | ICD-10-CM | POA: Diagnosis not present

## 2023-08-22 DIAGNOSIS — E11628 Type 2 diabetes mellitus with other skin complications: Secondary | ICD-10-CM | POA: Diagnosis not present

## 2023-08-22 DIAGNOSIS — M6281 Muscle weakness (generalized): Secondary | ICD-10-CM | POA: Diagnosis not present

## 2023-08-22 DIAGNOSIS — D631 Anemia in chronic kidney disease: Secondary | ICD-10-CM | POA: Diagnosis not present

## 2023-08-22 DIAGNOSIS — R627 Adult failure to thrive: Secondary | ICD-10-CM | POA: Diagnosis not present

## 2023-08-22 DIAGNOSIS — E559 Vitamin D deficiency, unspecified: Secondary | ICD-10-CM | POA: Diagnosis not present

## 2023-08-23 DIAGNOSIS — M6281 Muscle weakness (generalized): Secondary | ICD-10-CM | POA: Diagnosis not present

## 2023-08-28 DIAGNOSIS — M6281 Muscle weakness (generalized): Secondary | ICD-10-CM | POA: Diagnosis not present

## 2023-08-29 DIAGNOSIS — E11628 Type 2 diabetes mellitus with other skin complications: Secondary | ICD-10-CM | POA: Diagnosis not present

## 2023-08-29 DIAGNOSIS — M6281 Muscle weakness (generalized): Secondary | ICD-10-CM | POA: Diagnosis not present

## 2023-08-29 DIAGNOSIS — Z7409 Other reduced mobility: Secondary | ICD-10-CM | POA: Diagnosis not present

## 2023-08-29 DIAGNOSIS — E559 Vitamin D deficiency, unspecified: Secondary | ICD-10-CM | POA: Diagnosis not present

## 2023-08-29 DIAGNOSIS — D631 Anemia in chronic kidney disease: Secondary | ICD-10-CM | POA: Diagnosis not present

## 2023-08-29 DIAGNOSIS — L89890 Pressure ulcer of other site, unstageable: Secondary | ICD-10-CM | POA: Diagnosis not present

## 2023-08-29 DIAGNOSIS — E1151 Type 2 diabetes mellitus with diabetic peripheral angiopathy without gangrene: Secondary | ICD-10-CM | POA: Diagnosis not present

## 2023-08-29 DIAGNOSIS — R627 Adult failure to thrive: Secondary | ICD-10-CM | POA: Diagnosis not present

## 2023-08-29 DIAGNOSIS — R2681 Unsteadiness on feet: Secondary | ICD-10-CM | POA: Diagnosis not present

## 2023-08-31 DIAGNOSIS — K5900 Constipation, unspecified: Secondary | ICD-10-CM | POA: Diagnosis not present

## 2023-08-31 DIAGNOSIS — K219 Gastro-esophageal reflux disease without esophagitis: Secondary | ICD-10-CM | POA: Diagnosis not present

## 2023-08-31 DIAGNOSIS — M6281 Muscle weakness (generalized): Secondary | ICD-10-CM | POA: Diagnosis not present

## 2023-08-31 DIAGNOSIS — I10 Essential (primary) hypertension: Secondary | ICD-10-CM | POA: Diagnosis not present

## 2023-08-31 DIAGNOSIS — R1312 Dysphagia, oropharyngeal phase: Secondary | ICD-10-CM | POA: Diagnosis not present

## 2023-08-31 DIAGNOSIS — G4700 Insomnia, unspecified: Secondary | ICD-10-CM | POA: Diagnosis not present

## 2023-08-31 DIAGNOSIS — I480 Paroxysmal atrial fibrillation: Secondary | ICD-10-CM | POA: Diagnosis not present

## 2023-09-03 DIAGNOSIS — E1151 Type 2 diabetes mellitus with diabetic peripheral angiopathy without gangrene: Secondary | ICD-10-CM | POA: Diagnosis not present

## 2023-09-03 DIAGNOSIS — L89890 Pressure ulcer of other site, unstageable: Secondary | ICD-10-CM | POA: Diagnosis not present

## 2023-09-03 DIAGNOSIS — M6281 Muscle weakness (generalized): Secondary | ICD-10-CM | POA: Diagnosis not present

## 2023-09-03 DIAGNOSIS — E559 Vitamin D deficiency, unspecified: Secondary | ICD-10-CM | POA: Diagnosis not present

## 2023-09-03 DIAGNOSIS — E11628 Type 2 diabetes mellitus with other skin complications: Secondary | ICD-10-CM | POA: Diagnosis not present

## 2023-09-03 DIAGNOSIS — Z7409 Other reduced mobility: Secondary | ICD-10-CM | POA: Diagnosis not present

## 2023-09-03 DIAGNOSIS — R2681 Unsteadiness on feet: Secondary | ICD-10-CM | POA: Diagnosis not present

## 2023-09-03 DIAGNOSIS — D631 Anemia in chronic kidney disease: Secondary | ICD-10-CM | POA: Diagnosis not present

## 2023-09-03 DIAGNOSIS — R627 Adult failure to thrive: Secondary | ICD-10-CM | POA: Diagnosis not present

## 2023-09-07 DIAGNOSIS — M6281 Muscle weakness (generalized): Secondary | ICD-10-CM | POA: Diagnosis not present

## 2023-09-07 DIAGNOSIS — I480 Paroxysmal atrial fibrillation: Secondary | ICD-10-CM | POA: Diagnosis not present

## 2023-09-07 DIAGNOSIS — I10 Essential (primary) hypertension: Secondary | ICD-10-CM | POA: Diagnosis not present

## 2023-09-07 DIAGNOSIS — G4700 Insomnia, unspecified: Secondary | ICD-10-CM | POA: Diagnosis not present

## 2023-09-07 DIAGNOSIS — K5900 Constipation, unspecified: Secondary | ICD-10-CM | POA: Diagnosis not present

## 2023-09-07 DIAGNOSIS — R1312 Dysphagia, oropharyngeal phase: Secondary | ICD-10-CM | POA: Diagnosis not present

## 2023-09-07 DIAGNOSIS — K219 Gastro-esophageal reflux disease without esophagitis: Secondary | ICD-10-CM | POA: Diagnosis not present

## 2023-09-12 DIAGNOSIS — E1151 Type 2 diabetes mellitus with diabetic peripheral angiopathy without gangrene: Secondary | ICD-10-CM | POA: Diagnosis not present

## 2023-09-12 DIAGNOSIS — D631 Anemia in chronic kidney disease: Secondary | ICD-10-CM | POA: Diagnosis not present

## 2023-09-12 DIAGNOSIS — M6281 Muscle weakness (generalized): Secondary | ICD-10-CM | POA: Diagnosis not present

## 2023-09-12 DIAGNOSIS — Z7409 Other reduced mobility: Secondary | ICD-10-CM | POA: Diagnosis not present

## 2023-09-12 DIAGNOSIS — R627 Adult failure to thrive: Secondary | ICD-10-CM | POA: Diagnosis not present

## 2023-09-12 DIAGNOSIS — L89890 Pressure ulcer of other site, unstageable: Secondary | ICD-10-CM | POA: Diagnosis not present

## 2023-09-12 DIAGNOSIS — E559 Vitamin D deficiency, unspecified: Secondary | ICD-10-CM | POA: Diagnosis not present

## 2023-09-12 DIAGNOSIS — E11628 Type 2 diabetes mellitus with other skin complications: Secondary | ICD-10-CM | POA: Diagnosis not present

## 2023-09-12 DIAGNOSIS — R2681 Unsteadiness on feet: Secondary | ICD-10-CM | POA: Diagnosis not present

## 2023-09-19 DIAGNOSIS — Z7409 Other reduced mobility: Secondary | ICD-10-CM | POA: Diagnosis not present

## 2023-09-19 DIAGNOSIS — L89890 Pressure ulcer of other site, unstageable: Secondary | ICD-10-CM | POA: Diagnosis not present

## 2023-09-19 DIAGNOSIS — D631 Anemia in chronic kidney disease: Secondary | ICD-10-CM | POA: Diagnosis not present

## 2023-09-19 DIAGNOSIS — R627 Adult failure to thrive: Secondary | ICD-10-CM | POA: Diagnosis not present

## 2023-09-19 DIAGNOSIS — E11628 Type 2 diabetes mellitus with other skin complications: Secondary | ICD-10-CM | POA: Diagnosis not present

## 2023-09-19 DIAGNOSIS — E559 Vitamin D deficiency, unspecified: Secondary | ICD-10-CM | POA: Diagnosis not present

## 2023-09-19 DIAGNOSIS — R2681 Unsteadiness on feet: Secondary | ICD-10-CM | POA: Diagnosis not present

## 2023-09-19 DIAGNOSIS — M6281 Muscle weakness (generalized): Secondary | ICD-10-CM | POA: Diagnosis not present

## 2023-09-19 DIAGNOSIS — E1151 Type 2 diabetes mellitus with diabetic peripheral angiopathy without gangrene: Secondary | ICD-10-CM | POA: Diagnosis not present

## 2023-09-24 DIAGNOSIS — R627 Adult failure to thrive: Secondary | ICD-10-CM | POA: Diagnosis not present

## 2023-09-24 DIAGNOSIS — E1151 Type 2 diabetes mellitus with diabetic peripheral angiopathy without gangrene: Secondary | ICD-10-CM | POA: Diagnosis not present

## 2023-09-24 DIAGNOSIS — D631 Anemia in chronic kidney disease: Secondary | ICD-10-CM | POA: Diagnosis not present

## 2023-09-24 DIAGNOSIS — E11628 Type 2 diabetes mellitus with other skin complications: Secondary | ICD-10-CM | POA: Diagnosis not present

## 2023-09-24 DIAGNOSIS — E559 Vitamin D deficiency, unspecified: Secondary | ICD-10-CM | POA: Diagnosis not present

## 2023-09-24 DIAGNOSIS — R2681 Unsteadiness on feet: Secondary | ICD-10-CM | POA: Diagnosis not present

## 2023-09-24 DIAGNOSIS — Z7409 Other reduced mobility: Secondary | ICD-10-CM | POA: Diagnosis not present

## 2023-09-24 DIAGNOSIS — M6281 Muscle weakness (generalized): Secondary | ICD-10-CM | POA: Diagnosis not present

## 2023-09-24 DIAGNOSIS — L89890 Pressure ulcer of other site, unstageable: Secondary | ICD-10-CM | POA: Diagnosis not present

## 2023-10-01 DIAGNOSIS — M6281 Muscle weakness (generalized): Secondary | ICD-10-CM | POA: Diagnosis not present

## 2023-10-01 DIAGNOSIS — D631 Anemia in chronic kidney disease: Secondary | ICD-10-CM | POA: Diagnosis not present

## 2023-10-01 DIAGNOSIS — R627 Adult failure to thrive: Secondary | ICD-10-CM | POA: Diagnosis not present

## 2023-10-01 DIAGNOSIS — L89890 Pressure ulcer of other site, unstageable: Secondary | ICD-10-CM | POA: Diagnosis not present

## 2023-10-01 DIAGNOSIS — R2681 Unsteadiness on feet: Secondary | ICD-10-CM | POA: Diagnosis not present

## 2023-10-01 DIAGNOSIS — E11628 Type 2 diabetes mellitus with other skin complications: Secondary | ICD-10-CM | POA: Diagnosis not present

## 2023-10-01 DIAGNOSIS — Z7409 Other reduced mobility: Secondary | ICD-10-CM | POA: Diagnosis not present

## 2023-10-01 DIAGNOSIS — E559 Vitamin D deficiency, unspecified: Secondary | ICD-10-CM | POA: Diagnosis not present

## 2023-10-01 DIAGNOSIS — E1151 Type 2 diabetes mellitus with diabetic peripheral angiopathy without gangrene: Secondary | ICD-10-CM | POA: Diagnosis not present

## 2023-10-08 DIAGNOSIS — L89890 Pressure ulcer of other site, unstageable: Secondary | ICD-10-CM | POA: Diagnosis not present

## 2023-10-08 DIAGNOSIS — R627 Adult failure to thrive: Secondary | ICD-10-CM | POA: Diagnosis not present

## 2023-10-08 DIAGNOSIS — E1151 Type 2 diabetes mellitus with diabetic peripheral angiopathy without gangrene: Secondary | ICD-10-CM | POA: Diagnosis not present

## 2023-10-08 DIAGNOSIS — M6281 Muscle weakness (generalized): Secondary | ICD-10-CM | POA: Diagnosis not present

## 2023-10-08 DIAGNOSIS — D631 Anemia in chronic kidney disease: Secondary | ICD-10-CM | POA: Diagnosis not present

## 2023-10-08 DIAGNOSIS — E11628 Type 2 diabetes mellitus with other skin complications: Secondary | ICD-10-CM | POA: Diagnosis not present

## 2023-10-08 DIAGNOSIS — E559 Vitamin D deficiency, unspecified: Secondary | ICD-10-CM | POA: Diagnosis not present

## 2023-10-08 DIAGNOSIS — Z7409 Other reduced mobility: Secondary | ICD-10-CM | POA: Diagnosis not present

## 2023-10-08 DIAGNOSIS — R2681 Unsteadiness on feet: Secondary | ICD-10-CM | POA: Diagnosis not present

## 2023-10-15 DIAGNOSIS — E1151 Type 2 diabetes mellitus with diabetic peripheral angiopathy without gangrene: Secondary | ICD-10-CM | POA: Diagnosis not present

## 2023-10-15 DIAGNOSIS — R627 Adult failure to thrive: Secondary | ICD-10-CM | POA: Diagnosis not present

## 2023-10-15 DIAGNOSIS — R2681 Unsteadiness on feet: Secondary | ICD-10-CM | POA: Diagnosis not present

## 2023-10-15 DIAGNOSIS — L89890 Pressure ulcer of other site, unstageable: Secondary | ICD-10-CM | POA: Diagnosis not present

## 2023-10-15 DIAGNOSIS — M6281 Muscle weakness (generalized): Secondary | ICD-10-CM | POA: Diagnosis not present

## 2023-10-15 DIAGNOSIS — Z7409 Other reduced mobility: Secondary | ICD-10-CM | POA: Diagnosis not present

## 2023-10-15 DIAGNOSIS — D631 Anemia in chronic kidney disease: Secondary | ICD-10-CM | POA: Diagnosis not present

## 2023-10-15 DIAGNOSIS — E11628 Type 2 diabetes mellitus with other skin complications: Secondary | ICD-10-CM | POA: Diagnosis not present

## 2023-10-15 DIAGNOSIS — E559 Vitamin D deficiency, unspecified: Secondary | ICD-10-CM | POA: Diagnosis not present

## 2023-10-22 DIAGNOSIS — L89890 Pressure ulcer of other site, unstageable: Secondary | ICD-10-CM | POA: Diagnosis not present

## 2023-10-22 DIAGNOSIS — M6281 Muscle weakness (generalized): Secondary | ICD-10-CM | POA: Diagnosis not present

## 2023-10-22 DIAGNOSIS — Z7409 Other reduced mobility: Secondary | ICD-10-CM | POA: Diagnosis not present

## 2023-10-22 DIAGNOSIS — E559 Vitamin D deficiency, unspecified: Secondary | ICD-10-CM | POA: Diagnosis not present

## 2023-10-22 DIAGNOSIS — R2681 Unsteadiness on feet: Secondary | ICD-10-CM | POA: Diagnosis not present

## 2023-10-22 DIAGNOSIS — E1151 Type 2 diabetes mellitus with diabetic peripheral angiopathy without gangrene: Secondary | ICD-10-CM | POA: Diagnosis not present

## 2023-10-22 DIAGNOSIS — R627 Adult failure to thrive: Secondary | ICD-10-CM | POA: Diagnosis not present

## 2023-10-22 DIAGNOSIS — D631 Anemia in chronic kidney disease: Secondary | ICD-10-CM | POA: Diagnosis not present

## 2023-10-22 DIAGNOSIS — E11628 Type 2 diabetes mellitus with other skin complications: Secondary | ICD-10-CM | POA: Diagnosis not present
